# Patient Record
Sex: Female | Born: 1955 | Race: White | Hispanic: No | Marital: Single | Smoking: Current every day smoker
Health system: Southern US, Community
[De-identification: ages and names within clinical notes are randomized; demographics above are authoritative.]

## PROBLEM LIST (undated history)

## (undated) DIAGNOSIS — J439 Emphysema, unspecified: Secondary | ICD-10-CM

## (undated) DIAGNOSIS — G35D Multiple sclerosis, unspecified: Secondary | ICD-10-CM

## (undated) DIAGNOSIS — G47419 Narcolepsy without cataplexy: Secondary | ICD-10-CM

## (undated) DIAGNOSIS — G709 Myoneural disorder, unspecified: Secondary | ICD-10-CM

## (undated) DIAGNOSIS — M1990 Unspecified osteoarthritis, unspecified site: Secondary | ICD-10-CM

## (undated) DIAGNOSIS — J449 Chronic obstructive pulmonary disease, unspecified: Secondary | ICD-10-CM

## (undated) DIAGNOSIS — D649 Anemia, unspecified: Secondary | ICD-10-CM

## (undated) DIAGNOSIS — G35 Multiple sclerosis: Secondary | ICD-10-CM

## (undated) HISTORY — PX: TUBAL LIGATION: SHX77

## (undated) HISTORY — DX: Anemia, unspecified: D64.9

## (undated) HISTORY — PX: ORIF FACIAL FRACTURE: SHX2118

## (undated) HISTORY — PX: OTHER SURGICAL HISTORY: SHX169

## (undated) HISTORY — DX: Myoneural disorder, unspecified: G70.9

## (undated) HISTORY — PX: EYE SURGERY: SHX253

## (undated) HISTORY — DX: Chronic obstructive pulmonary disease, unspecified: J44.9

## (undated) HISTORY — DX: Emphysema, unspecified: J43.9

## (undated) HISTORY — DX: Unspecified osteoarthritis, unspecified site: M19.90

## (undated) HISTORY — DX: Narcolepsy without cataplexy: G47.419

---

## 2005-04-17 ENCOUNTER — Emergency Department (HOSPITAL_COMMUNITY): Admission: EM | Admit: 2005-04-17 | Discharge: 2005-04-17 | Payer: Self-pay | Admitting: Emergency Medicine

## 2006-06-21 ENCOUNTER — Emergency Department (HOSPITAL_COMMUNITY): Admission: EM | Admit: 2006-06-21 | Discharge: 2006-06-22 | Payer: Self-pay | Admitting: Emergency Medicine

## 2009-01-05 ENCOUNTER — Emergency Department (HOSPITAL_COMMUNITY): Admission: EM | Admit: 2009-01-05 | Discharge: 2009-01-06 | Payer: Self-pay | Admitting: Emergency Medicine

## 2009-01-28 ENCOUNTER — Emergency Department (HOSPITAL_COMMUNITY): Admission: EM | Admit: 2009-01-28 | Discharge: 2009-01-28 | Payer: Self-pay | Admitting: Emergency Medicine

## 2010-06-03 LAB — DIFFERENTIAL
Basophils Absolute: 0 10*3/uL (ref 0.0–0.1)
Basophils Relative: 0 % (ref 0–1)
Eosinophils Absolute: 0.2 10*3/uL (ref 0.0–0.7)
Eosinophils Relative: 2 % (ref 0–5)
Lymphocytes Relative: 20 % (ref 12–46)
Lymphs Abs: 2.3 10*3/uL (ref 0.7–4.0)
Monocytes Absolute: 0.8 10*3/uL (ref 0.1–1.0)
Monocytes Relative: 7 % (ref 3–12)
Neutro Abs: 8.3 10*3/uL — ABNORMAL HIGH (ref 1.7–7.7)
Neutrophils Relative %: 71 % (ref 43–77)

## 2010-06-03 LAB — CBC
HCT: 36.9 % (ref 36.0–46.0)
Hemoglobin: 12.8 g/dL (ref 12.0–15.0)
MCHC: 34.6 g/dL (ref 30.0–36.0)
MCV: 99.3 fL (ref 78.0–100.0)
Platelets: 257 10*3/uL (ref 150–400)
RBC: 3.72 MIL/uL — ABNORMAL LOW (ref 3.87–5.11)
RDW: 13.4 % (ref 11.5–15.5)
WBC: 11.6 10*3/uL — ABNORMAL HIGH (ref 4.0–10.5)

## 2010-06-03 LAB — GLUCOSE, CAPILLARY: Glucose-Capillary: 95 mg/dL (ref 70–99)

## 2010-06-03 LAB — BASIC METABOLIC PANEL
BUN: 8 mg/dL (ref 6–23)
CO2: 29 mEq/L (ref 19–32)
Calcium: 8.8 mg/dL (ref 8.4–10.5)
Chloride: 101 mEq/L (ref 96–112)
Creatinine, Ser: 0.61 mg/dL (ref 0.4–1.2)
GFR calc Af Amer: >60 mL/min (ref >60)
GFR calc non Af Amer: >60 mL/min (ref >60)
Glucose, Bld: 114 mg/dL — ABNORMAL HIGH (ref 70–99)
Potassium: 3.7 mEq/L (ref 3.5–5.1)
Sodium: 136 mEq/L (ref 135–145)

## 2010-06-03 LAB — POCT CARDIAC MARKERS
CKMB, poc: <1 ng/mL — ABNORMAL LOW (ref 1.0–8.0)
Myoglobin, poc: 39.5 ng/mL (ref 12–200)
Troponin i, poc: <0.05 ng/mL (ref 0.00–0.09)

## 2010-06-03 LAB — ETHANOL: Alcohol, Ethyl (B): <5 mg/dL (ref 0–10)

## 2015-10-14 ENCOUNTER — Encounter (HOSPITAL_COMMUNITY): Payer: Self-pay | Admitting: Emergency Medicine

## 2015-10-14 ENCOUNTER — Emergency Department (HOSPITAL_COMMUNITY)
Admission: EM | Admit: 2015-10-14 | Discharge: 2015-10-15 | Disposition: A | Discharge status: Home / Self Care | Payer: Medicaid Other | Attending: Emergency Medicine | Admitting: Emergency Medicine

## 2015-10-14 DIAGNOSIS — F17200 Nicotine dependence, unspecified, uncomplicated: Secondary | ICD-10-CM | POA: Insufficient documentation

## 2015-10-14 DIAGNOSIS — Z791 Long term (current) use of non-steroidal anti-inflammatories (NSAID): Secondary | ICD-10-CM | POA: Insufficient documentation

## 2015-10-14 DIAGNOSIS — Y939 Activity, unspecified: Secondary | ICD-10-CM | POA: Diagnosis not present

## 2015-10-14 DIAGNOSIS — G8929 Other chronic pain: Secondary | ICD-10-CM | POA: Insufficient documentation

## 2015-10-14 DIAGNOSIS — W19XXXA Unspecified fall, initial encounter: Secondary | ICD-10-CM | POA: Insufficient documentation

## 2015-10-14 DIAGNOSIS — Y999 Unspecified external cause status: Secondary | ICD-10-CM | POA: Insufficient documentation

## 2015-10-14 DIAGNOSIS — M545 Low back pain, unspecified: Secondary | ICD-10-CM

## 2015-10-14 DIAGNOSIS — Y929 Unspecified place or not applicable: Secondary | ICD-10-CM | POA: Insufficient documentation

## 2015-10-14 NOTE — ED Triage Notes (Signed)
Pt c/o back pain and legs buckling for a while. Pt states she fell twice today.

## 2015-10-15 ENCOUNTER — Emergency Department (HOSPITAL_COMMUNITY): Payer: Medicaid Other

## 2015-10-15 LAB — BASIC METABOLIC PANEL
Anion gap: 7 (ref 5–15)
BUN: 10 mg/dL (ref 6–20)
CO2: 26 mmol/L (ref 22–32)
Calcium: 9 mg/dL (ref 8.9–10.3)
Chloride: 100 mmol/L — ABNORMAL LOW (ref 101–111)
Creatinine, Ser: 0.79 mg/dL (ref 0.44–1.00)
GFR calc Af Amer: >60 mL/min (ref >60)
GFR calc non Af Amer: >60 mL/min (ref >60)
Glucose, Bld: 102 mg/dL — ABNORMAL HIGH (ref 65–99)
Potassium: 3.5 mmol/L (ref 3.5–5.1)
Sodium: 133 mmol/L — ABNORMAL LOW (ref 135–145)

## 2015-10-15 LAB — CBC WITH DIFFERENTIAL/PLATELET
Basophils Absolute: 0 10*3/uL (ref 0.0–0.1)
Basophils Relative: 0 %
Eosinophils Absolute: 0.2 10*3/uL (ref 0.0–0.7)
Eosinophils Relative: 2 %
HCT: 43.8 % (ref 36.0–46.0)
Hemoglobin: 15.1 g/dL — ABNORMAL HIGH (ref 12.0–15.0)
Lymphocytes Relative: 27 %
Lymphs Abs: 3.2 10*3/uL (ref 0.7–4.0)
MCH: 32.2 pg (ref 26.0–34.0)
MCHC: 34.5 g/dL (ref 30.0–36.0)
MCV: 93.4 fL (ref 78.0–100.0)
Monocytes Absolute: 0.9 10*3/uL (ref 0.1–1.0)
Monocytes Relative: 8 %
Neutro Abs: 7.6 10*3/uL (ref 1.7–7.7)
Neutrophils Relative %: 63 %
Platelets: 310 10*3/uL (ref 150–400)
RBC: 4.69 MIL/uL (ref 3.87–5.11)
RDW: 13.1 % (ref 11.5–15.5)
WBC: 11.9 10*3/uL — ABNORMAL HIGH (ref 4.0–10.5)

## 2015-10-15 LAB — URINALYSIS, ROUTINE W REFLEX MICROSCOPIC
Bilirubin Urine: NEGATIVE
Glucose, UA: NEGATIVE mg/dL
Hgb urine dipstick: NEGATIVE
Ketones, ur: NEGATIVE mg/dL
Leukocytes, UA: NEGATIVE
Nitrite: NEGATIVE
Protein, ur: NEGATIVE mg/dL
Specific Gravity, Urine: <1.005 — ABNORMAL LOW (ref 1.005–1.030)
pH: 6 (ref 5.0–8.0)

## 2015-10-15 MED ORDER — PREDNISONE 20 MG PO TABS: ORAL_TABLET | ORAL | 0 refills | Status: DC

## 2015-10-15 MED ORDER — CYCLOBENZAPRINE HCL 10 MG PO TABS
10.0000 mg | ORAL_TABLET | Freq: Once | ORAL | Status: AC
  Administered 2015-10-15: 10 mg via ORAL
  Filled 2015-10-15: qty 1

## 2015-10-15 MED ORDER — DEXAMETHASONE SODIUM PHOSPHATE 10 MG/ML IJ SOLN
10.0000 mg | Freq: Once | INTRAMUSCULAR | Status: AC
  Administered 2015-10-15: 10 mg via INTRAMUSCULAR
  Filled 2015-10-15: qty 1

## 2015-10-15 MED ORDER — KETOROLAC TROMETHAMINE 60 MG/2ML IM SOLN
60.0000 mg | Freq: Once | INTRAMUSCULAR | Status: AC
  Administered 2015-10-15: 60 mg via INTRAMUSCULAR
  Filled 2015-10-15: qty 2

## 2015-10-15 MED ORDER — OXYCODONE-ACETAMINOPHEN 5-325 MG PO TABS
1.0000 | ORAL_TABLET | Freq: Once | ORAL | Status: AC
  Administered 2015-10-15: 1 via ORAL
  Filled 2015-10-15: qty 1

## 2015-10-15 MED ORDER — CYCLOBENZAPRINE HCL 5 MG PO TABS: 5.0000 mg | ORAL_TABLET | Freq: Three times a day (TID) | ORAL | 0 refills | Status: DC | PRN

## 2015-10-15 NOTE — Discharge Instructions (Signed)
Use ice and heat on your back. Take the medications as prescribed. Keep your appointment with Dr Legrand Rams as scheduled. You need to start using a cane or walker. You need to let Dr Legrand Rams know if you are getting worse before your appointment.

## 2015-10-15 NOTE — ED Provider Notes (Signed)
[facility] DEPT Provider Note   CSN: [id number] Arrival date & time: 10/14/15  2245     History   Chief Complaint Chief Complaint  Patient presents with  . Back Pain    HPI Faith [name] is a 60 y.o. female.  HPI   Faith [name] is a 60 y.o. female who presents to the Emergency Department complaining of gradually worsening low back pain.  She reports back pain since April of this year that has now progressed and she is having difficulty walking and standing and has to hold to objects to walk.  She reports weakness to both legs with standing and states she fell two times earlier today.  She states she has been taking ibuprofen which typically helps the pain.  She states the pain is constant and worse with standing.  She denies swelling or numbness of her LE's, fever, abdominal pain,urine or bowel incontinence or retention.  She has a first time appt with Dr. Legrand Rams on Sept 1  History reviewed. No pertinent past medical history.  There are no active problems to display for this patient.   Past Surgical History:  Procedure Laterality Date  . ORIF FACIAL FRACTURE      OB History    No data available       Home Medications    Prior to Admission medications   Medication Sig Start Date End Date Taking? Authorizing Provider  ibuprofen (ADVIL,MOTRIN) 200 MG tablet Take 200 mg by mouth every 6 (six) hours as needed.   Yes Historical Provider, MD    Family History History reviewed. No pertinent family history.  Social History Social History  Substance Use Topics  . Smoking status: Current Every Day Smoker  . Smokeless tobacco: Never Used  . Alcohol use No     Allergies   Diovan [valsartan] and Penicillins   Review of Systems Review of Systems  Constitutional: Negative for fever.  Respiratory: Negative for shortness of breath.   Gastrointestinal: Negative for abdominal pain, blood in stool, constipation and vomiting.  Genitourinary: Negative for decreased urine  volume, difficulty urinating, dysuria, flank pain and hematuria.  Musculoskeletal: Positive for back pain. Negative for joint swelling.  Skin: Negative for rash.  Neurological: Negative for weakness (bilateral leg weakness) and numbness.  All other systems reviewed and are negative.    Physical Exam Updated Vital Signs BP 154/94 (BP Location: Right Arm)   Pulse 77   Temp 98.1 F (36.7 C) (Oral)   Resp 18   Ht 5\' 3"  (1.6 m)   Wt 51.3 kg   SpO2 99%   BMI 20.02 kg/m   Physical Exam  Constitutional: She is oriented to person, place, and time. She appears well-developed and well-nourished.  Appears frail and uncomfortable  HENT:  Head: Normocephalic and atraumatic.  Mouth/Throat: Oropharynx is clear and moist.  Neck: Normal range of motion. Neck supple.  Cardiovascular: Normal rate, regular rhythm and intact distal pulses.   No murmur heard. Pulmonary/Chest: Effort normal and breath sounds normal. No respiratory distress.  Abdominal: Soft. She exhibits no distension and no mass. There is no tenderness. There is no guarding.  Musculoskeletal: She exhibits tenderness. She exhibits no edema.       Lumbar back: She exhibits tenderness and pain. She exhibits normal range of motion, no swelling, no deformity, no laceration and normal pulse.  ttp of the lower lumbar spine and bilateral lumbar paraspinal muscles.  DP pulses are brisk and symmetrical.  Distal sensation intact.  Pt  has 3/5 strength against resistance of bilateral lower extremities.  No edema   Neurological: She is alert and oriented to person, place, and time. She has normal strength. No sensory deficit. She exhibits normal muscle tone. Coordination normal.  Skin: Skin is warm and dry. No rash noted.  Nursing note and vitals reviewed.    ED Treatments / Results  Labs (all labs ordered are listed, but only abnormal results are displayed) Labs Reviewed  BASIC METABOLIC PANEL - Abnormal; Notable for the following:        Result Value   Sodium 133 (*)    Chloride 100 (*)    Glucose, Bld 102 (*)    All other components within normal limits  CBC WITH DIFFERENTIAL/PLATELET - Abnormal; Notable for the following:    WBC 11.9 (*)    Hemoglobin 15.1 (*)    All other components within normal limits  URINALYSIS, ROUTINE W REFLEX MICROSCOPIC (NOT AT [facility])    EKG  EKG Interpretation None       Radiology Dg Lumbar Spine Complete  Result Date: 10/15/2015 CLINICAL DATA:  Chronic lower back pain and difficulty ambulating. Balance issues. Initial encounter. EXAM: LUMBAR SPINE - COMPLETE 4+ VIEW COMPARISON:  Lumbar spine radiographs performed 06/18/2015 FINDINGS: There is no evidence of fracture or subluxation. Vertebral bodies demonstrate normal height and alignment. Mild disc space narrowing is noted at L4-L5, with associated vacuum phenomenon. Underlying facet disease is noted. The visualized bowel gas pattern is unremarkable in appearance; air and stool are noted within the colon. The sacroiliac joints are within normal limits. IMPRESSION: No evidence of fracture or subluxation along the lumbar spine. Mild degenerative change at the lower lumbar spine. Electronically Signed   By: Garald Balding M.D.   On: 10/15/2015 00:53    Procedures Procedures (including critical care time)  Medications Ordered in ED Medications  ketorolac (TORADOL) injection 60 mg (not administered)  cyclobenzaprine (FLEXERIL) tablet 10 mg (not administered)  oxyCODONE-acetaminophen (PERCOCET/ROXICET) 5-325 MG per tablet 1 tablet (1 tablet Oral Given 10/15/15 0021)     Initial Impression / Assessment and Plan / ED Course  I have reviewed the triage vital signs and the nursing notes.  Pertinent labs & imaging results that were available during my care of the patient were reviewed by me and considered in my medical decision making (see chart for details).  Clinical Course   Pt with worsening, chronic low back pain and bilateral LE  weakness.  No fever or hx of incontinence of urine or stool.  XR unremarkable.  Hypertensive on arrival, but improving after pain control.    0140 Pain improving after medications.  Urine results pending.  Discussed patient care with Dr. Rolland Porter.  Will also obtain CT of L spine. End of shift, pt signed out to Dr. Tomi Bamberger  Final Clinical Impressions(s) / ED Diagnoses   Final diagnoses:  Chronic low back pain    New Prescriptions New Prescriptions   No medications on file     Bufford Lope 10/15/15 [place], MD 10/15/15 2258

## 2015-10-15 NOTE — ED Provider Notes (Signed)
Patient reports she has been having low back pain that started about a year ago. She went for a disability exam in March but does not have the results of those tests. She states that she is getting to the point now that she has to hold onto furniture or she will fall. She states she's falling more often now than she was several months ago. She states she feels like her knees buckle underneath her. She is not using anything to help her walk such as a cane or walker. She denies any urinary or rectal incontinence.  Patient is sitting in a bedside chair. Interestingly she is able to cross her legs during normal conversation and did that at least twice. She seems to have difficulty standing up. She indicates her lower back is diffusely the area involved with pain. Her patellar reflexes are 2+ and equal, she does not have pain on straight leg raising.  Results for orders placed or performed during the hospital encounter of 10/14/15  Urinalysis, Routine w reflex microscopic  Result Value Ref Range   Color, Urine YELLOW YELLOW   APPearance CLEAR CLEAR   Specific Gravity, Urine <1.005 (L) 1.005 - 1.030   pH 6.0 5.0 - 8.0   Glucose, UA NEGATIVE NEGATIVE mg/dL   Hgb urine dipstick NEGATIVE NEGATIVE   Bilirubin Urine NEGATIVE NEGATIVE   Ketones, ur NEGATIVE NEGATIVE mg/dL   Protein, ur NEGATIVE NEGATIVE mg/dL   Nitrite NEGATIVE NEGATIVE   Leukocytes, UA NEGATIVE NEGATIVE  Basic metabolic panel  Result Value Ref Range   Sodium 133 (L) 135 - 145 mmol/L   Potassium 3.5 3.5 - 5.1 mmol/L   Chloride 100 (L) 101 - 111 mmol/L   CO2 26 22 - 32 mmol/L   Glucose, Bld 102 (H) 65 - 99 mg/dL   BUN 10 6 - 20 mg/dL   Creatinine, Ser 0.79 0.44 - 1.00 mg/dL   Calcium 9.0 8.9 - 10.3 mg/dL   GFR calc non Af Amer >60 >60 mL/min   GFR calc Af Amer >60 >60 mL/min   Anion gap 7 5 - 15  CBC with Differential  Result Value Ref Range   WBC 11.9 (H) 4.0 - 10.5 K/uL   RBC 4.69 3.87 - 5.11 MIL/uL   Hemoglobin 15.1 (H) 12.0  - 15.0 g/dL   HCT 43.8 36.0 - 46.0 %   MCV 93.4 78.0 - 100.0 fL   MCH 32.2 26.0 - 34.0 pg   MCHC 34.5 30.0 - 36.0 g/dL   RDW 13.1 11.5 - 15.5 %   Platelets 310 150 - 400 K/uL   Neutrophils Relative % 63 %   Neutro Abs 7.6 1.7 - 7.7 K/uL   Lymphocytes Relative 27 %   Lymphs Abs 3.2 0.7 - 4.0 K/uL   Monocytes Relative 8 %   Monocytes Absolute 0.9 0.1 - 1.0 K/uL   Eosinophils Relative 2 %   Eosinophils Absolute 0.2 0.0 - 0.7 K/uL   Basophils Relative 0 %   Basophils Absolute 0.0 0.0 - 0.1 K/uL    Laboratory interpretation all normal except mild leukocytosis, no uti   Dg Lumbar Spine Complete  Result Date: 10/15/2015 CLINICAL DATA:  Chronic lower back pain and difficulty ambulating. Balance issues. Initial encounter. EXAM: LUMBAR SPINE - COMPLETE 4+ VIEW COMPARISON:  Lumbar spine radiographs performed 06/18/2015 FINDINGS: There is no evidence of fracture or subluxation. Vertebral bodies demonstrate normal height and alignment. Mild disc space narrowing is noted at L4-L5, with associated vacuum phenomenon. Underlying facet  disease is noted. The visualized bowel gas pattern is unremarkable in appearance; air and stool are noted within the colon. The sacroiliac joints are within normal limits. IMPRESSION: No evidence of fracture or subluxation along the lumbar spine. Mild degenerative change at the lower lumbar spine. Electronically Signed   By: Garald Balding M.D.   On: 10/15/2015 00:53   Ct Lumbar Spine Wo Contrast  Result Date: 10/15/2015 CLINICAL DATA:  60 y/o F; back pain and leg buckling. Fell twice today. EXAM: CT LUMBAR SPINE WITHOUT CONTRAST TECHNIQUE: Multidetector CT imaging of the lumbar spine was performed without intravenous contrast administration. Multiplanar CT image reconstructions were also generated. COMPARISON:  Lumbar radiographs dated 10/15/2015. FINDINGS: Alignment: Five lumbar type non-rib-bearing vertebral bodies are present. Lumbar lordosis is maintained without  listhesis. No pars defect. Vertebral body heights are preserved. No acute fracture is identified. Mild coronal S-shaped curvature is present. There is multilevel degenerative changes with disc space narrowing at the L4-5 level where there is vacuum phenomenon and endplate degenerative changes. There is moderate multilevel facet arthrosis. Disc levels: L1-2: No significant disc displacement, foraminal narrowing, or canal stenosis. L2-3: Disc bulge and ligamentum flavum hypertrophy with mild bilateral foraminal narrowing. L3-4: Disc bulge and moderate facet hypertrophy/ligamentum flavum hypertrophy. Mild-to-moderate bilateral foraminal narrowing and narrowing of lateral recesses. Mild-to-moderate canal stenosis. L4-5: Disc bulge with ossific ridging and moderate bilateral facet/flavum hypertrophy. Mild-to-moderate bilateral foraminal stenosis. Lateral recess narrowing. L5-S1: Disc bulge and facet hypertrophy with mild bilateral foraminal narrowing. Soft tissues: Moderate calcific aortic atherosclerosis of the infrarenal abdominal aorta and bilateral iliac arteries. No soft tissue abnormality is identified on this noncontrast examination. IMPRESSION: 1. No acute fracture or dislocation is identified. 2. 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 3. Aortic atherosclerosis. Electronically Signed   By: Kristine Garbe M.D.   On: 10/15/2015 02:40   Diagnoses that have been ruled out:  None  Diagnoses that are still under consideration:  None  Final diagnoses:  Chronic low back pain    New Prescriptions   CYCLOBENZAPRINE (FLEXERIL) 5 MG TABLET    Take 1 tablet (5 mg total) by mouth 3 (three) times daily as needed.   PREDNISONE (DELTASONE) 20 MG TABLET    Take 3 po QD x 3d , then 2 po QD x 3d then 1 po QD x 3d    Plan discharge   Rolland Porter, MD, [place] screening examination/treatment/procedure(s) were conducted as a shared visit with non-physician  practitioner(s) and myself.  I personally evaluated the patient during the encounter.    Rolland Porter, MD, [place], MD 10/15/15 [phone]

## 2015-12-11 ENCOUNTER — Encounter (INDEPENDENT_AMBULATORY_CARE_PROVIDER_SITE_OTHER): Payer: Self-pay

## 2015-12-11 ENCOUNTER — Encounter: Payer: Self-pay | Admitting: Family Medicine

## 2015-12-11 ENCOUNTER — Ambulatory Visit (INDEPENDENT_AMBULATORY_CARE_PROVIDER_SITE_OTHER): Payer: Medicaid Other | Admitting: Family Medicine

## 2015-12-11 VITALS — BP 138/76 | HR 99 | Ht 63.5 in | Wt 115.0 lb

## 2015-12-11 DIAGNOSIS — R296 Repeated falls: Secondary | ICD-10-CM | POA: Insufficient documentation

## 2015-12-11 DIAGNOSIS — R29898 Other symptoms and signs involving the musculoskeletal system: Secondary | ICD-10-CM

## 2015-12-11 DIAGNOSIS — E559 Vitamin D deficiency, unspecified: Secondary | ICD-10-CM

## 2015-12-11 DIAGNOSIS — R251 Tremor, unspecified: Secondary | ICD-10-CM | POA: Insufficient documentation

## 2015-12-11 DIAGNOSIS — Z7689 Persons encountering health services in other specified circumstances: Secondary | ICD-10-CM

## 2015-12-11 DIAGNOSIS — R200 Anesthesia of skin: Secondary | ICD-10-CM

## 2015-12-11 DIAGNOSIS — Z720 Tobacco use: Secondary | ICD-10-CM | POA: Insufficient documentation

## 2015-12-11 DIAGNOSIS — G47419 Narcolepsy without cataplexy: Secondary | ICD-10-CM

## 2015-12-11 DIAGNOSIS — J438 Other emphysema: Secondary | ICD-10-CM | POA: Insufficient documentation

## 2015-12-11 DIAGNOSIS — M47896 Other spondylosis, lumbar region: Secondary | ICD-10-CM

## 2015-12-11 DIAGNOSIS — G252 Other specified forms of tremor: Secondary | ICD-10-CM | POA: Insufficient documentation

## 2015-12-11 DIAGNOSIS — Z23 Encounter for immunization: Secondary | ICD-10-CM

## 2015-12-11 DIAGNOSIS — M47816 Spondylosis without myelopathy or radiculopathy, lumbar region: Secondary | ICD-10-CM | POA: Insufficient documentation

## 2015-12-11 DIAGNOSIS — I700 Atherosclerosis of aorta: Secondary | ICD-10-CM | POA: Insufficient documentation

## 2015-12-11 LAB — CBC WITH DIFFERENTIAL/PLATELET
Basophils Absolute: 0 {cells}/uL (ref 0–200)
Basophils Relative: 0 %
Eosinophils Absolute: 103 {cells}/uL (ref 15–500)
Eosinophils Relative: 1 %
HCT: 43.5 % (ref 35.0–45.0)
Hemoglobin: 14.8 g/dL (ref 11.7–15.5)
Lymphocytes Relative: 21 %
Lymphs Abs: 2163 {cells}/uL (ref 850–3900)
MCH: 31.8 pg (ref 27.0–33.0)
MCHC: 34 g/dL (ref 32.0–36.0)
MCV: 93.3 fL (ref 80.0–100.0)
MPV: 9.2 fL (ref 7.5–12.5)
Monocytes Absolute: 721 {cells}/uL (ref 200–950)
Monocytes Relative: 7 %
Neutro Abs: 7313 {cells}/uL (ref 1500–7800)
Neutrophils Relative %: 71 %
Platelets: 347 K/uL (ref 140–400)
RBC: 4.66 MIL/uL (ref 3.80–5.10)
RDW: 13.6 % (ref 11.0–15.0)
WBC: 10.3 K/uL (ref 3.8–10.8)

## 2015-12-11 LAB — VITAMIN B12: Vitamin B-12: 736 pg/mL (ref 200–1100)

## 2015-12-11 MED ORDER — GABAPENTIN 300 MG PO CAPS: 300.0000 mg | ORAL_CAPSULE | Freq: Three times a day (TID) | ORAL | 1 refills | Status: DC

## 2015-12-11 MED ORDER — BUPROPION HCL ER (SR) 150 MG PO TB12: 150.0000 mg | ORAL_TABLET | Freq: Two times a day (BID) | ORAL | 1 refills | Status: DC

## 2015-12-11 MED ORDER — IBUPROFEN 800 MG PO TABS: 800.0000 mg | ORAL_TABLET | Freq: Three times a day (TID) | ORAL | 0 refills | Status: DC | PRN

## 2015-12-11 NOTE — Patient Instructions (Signed)
Take the wellbutrin once a day for one week..then increase to two times a day.  Once you have been on this for 2-3 weeks, set a quit date and PUT CIGARETTES DOWN.  May use nicotine patch or replacement as needed.  Take the ibuprofen as needed pain.  Take with food  Take the gabapentin for pain and nerve damage in legs.  Start at bedtime .  After a couple of days you may increase to 2 to 3 times a day.  May cause drowsiness.  Labs today. I will send you a letter with your test results.  If there is anything of concern, we will call right away.  I have referred to a spine specialist  I have referred to a neurologist  See me in one month

## 2015-12-11 NOTE — Addendum Note (Signed)
Addended by: Milta Deiters on: 12/11/2015 03:40 PM   Modules accepted: Orders

## 2015-12-11 NOTE — Progress Notes (Signed)
Chief Complaint  Patient presents with  . New Patient (Initial Visit)    establish care   New to establish. This is a frail and very sick appearing 60 year old woman who appears older than stated age.  No records.  No recent medical care.  She stopped working 4 years ago because of low back pain and has become progressively disabled from back pain, leg numbness and weakness ever since then.  She  Has had multiple falls.  Takes ibuprofen for pain.   ER note from august is reviewed and she had a lumbar CT scan.  It shows significant DJD lumbar spine with osteophytes and neural foraminal narrowing and some spinal stenosis.  Labs at that time normal. NO prior back evaluation or treatment, no Pt, NO specialist evaluation, no other imaging.   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 She has a significant intention tremor and this keeps her from eating.  Literally cannot get food to her mouth. Is undernourished. She smokes cigarettes.  Hs been told she has COPD.  I have discussed the multiple health risks associated with cigarette smoking including, but not limited to, cardiovascular disease, lung disease and cancer.  I have strongly recommended that smoking be stopped.  I have reviewed the various methods of quitting including cold [place], classes, nicotine replacements and prescription medications.  I have offered assistance in this difficult process. We specifically discussed that she has aortic atherosclerosis on her CT scan.  She is told she will have a poor outcome from any spine treatment or surgery as a smoker an ddilligently needs to work on quitting.  She agrees to trial of wellbutrin.  Refuses chantix. Last Pap and Mammo over 10 y Never had colo Cannot remember shots but feels like she had a tetanus shot in 2010 when had face injury and surgery.  Agrees to flu and prevnar today and shingles in Jan when she turns 61.    Patient Active Problem List   Diagnosis Date Noted  . Tobacco abuse 12/11/2015  . Aortic atherosclerosis ([place]) 12/11/2015  . 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 Falls frequently 12/11/2015  . 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 Other emphysema ([place]) 12/11/2015    Outpatient Encounter Prescriptions as of 12/11/2015  Medication Sig  . buPROPion (WELLBUTRIN SR) 150 MG 12 hr tablet Take 1 tablet (150 mg total) by mouth 2 (two) times daily.  Marland Kitchen gabapentin (NEURONTIN) 300 MG capsule Take 1 capsule (300 mg total) by mouth 3 (three) times daily. start with one at bedtime.  Increase as tolerated to TID  . ibuprofen (ADVIL,MOTRIN) 800 MG tablet Take 1 tablet (800 mg total) by mouth every 8 (eight) hours as needed.   No facility-administered encounter medications on file as of 12/11/2015.    Past Medical History:  Diagnosis Date  . Anemia    my whole life, Used to take iron  . Arthritis    spine, lumbar  . COPD (chronic obstructive pulmonary disease) ([place])   . Emphysema of lung ([place])   . Narcolepsy    by history, has nightmares  . Neuromuscular disorder ([place])    leg issues from spine    Past Surgical History:  Procedure Laterality Date  . EYE SURGERY Right   . ORIF FACIAL FRACTURE    . TUBAL LIGATION    . tubaligation      Social History   Social History  .  Marital status: Legally Separated    Spouse name: N/A  . Number of children: 3  . Years of education: 12   Occupational History  . disabled     house painting   Social History Main Topics  . Smoking status: Current Every Day Smoker    Packs/day: 0.50    Years: 45.00  . Smokeless tobacco: Never Used  . Alcohol use 1.2 oz/week    2 Cans of beer per week  . Drug use: No  . Sexual activity: Not Currently    Birth control/ protection: Post-menopausal   Other Topics Concern  . Not on file   Social History Narrative   Lives with family or friends   Family History  Problem Relation Age of Onset    . Heart attack Mother   . Kidney failure Mother   . Diabetes Mother   . Heart disease Mother   . Hyperlipidemia Mother   . Hypertension Mother   . Heart attack Father 31  . Hypertension Father   . Hypertension Sister   . Diabetes Sister   . Other Brother     house fire  . Hypertension Brother   . Seizures Brother   . Heart disease Brother     mitral valve  . Arthritis Daughter     DJD, AS fibromyalgia  . Polymyositis Daughter   . Cancer Maternal Grandfather     lung  . Heart disease Maternal Grandfather   . Other Paternal Grandmother     house fire  . Alcohol abuse Son    Review of Systems  Constitutional: Positive for malaise/fatigue and weight loss. Negative for chills and fever.  HENT: Negative for congestion and hearing loss.        Dental problems  Eyes: Negative for blurred vision and pain.       Poor vision  Respiratory: Positive for cough. Negative for shortness of breath.        "smokers cough"  Cardiovascular: Negative for chest pain, palpitations and leg swelling.  Gastrointestinal: Negative for abdominal pain, constipation, diarrhea and heartburn.  Genitourinary: Negative for dysuria and frequency.       No vaginal bleeding  Musculoskeletal: Positive for back pain, falls and myalgias. Negative for joint pain.  Neurological: Positive for tremors, sensory change and weakness. Negative for dizziness, seizures and headaches.  Endo/Heme/Allergies: Negative for environmental allergies. Bruises/bleeds easily.  Psychiatric/Behavioral: Negative for depression. The patient is not nervous/anxious and does not have insomnia.    BP 138/76 (BP Location: Left Arm, Patient Position: Sitting, Cuff Size: Normal)   Pulse 99   Ht 5' 3.5" (1.613 m)   Wt 115 lb 0.6 oz (52.2 kg)   SpO2 95%   BMI 20.06 kg/m   Physical Exam  Constitutional: She is oriented to person, place, and time. She appears well-developed.  Thin.  Frail.  Stooped posture.  Small steps with walker   HENT:  Head: Normocephalic and atraumatic.  Right Ear: External ear normal.  Left Ear: External ear normal.  Nose: Nose normal.  Mouth/Throat: Oropharynx is clear and moist.  Cerumen L Poor dentition lower Upper plate  Eyes: Conjunctivae are normal. Pupils are equal, round, and reactive to light.  Neck: Normal range of motion. Neck supple. No JVD present. No thyromegaly present.  Cardiovascular: Normal rate, regular rhythm and normal heart sounds.   Pulmonary/Chest: Effort normal and breath sounds normal. No respiratory distress. She has no wheezes.  Abdominal: Soft. Bowel sounds are normal. She exhibits no distension.  There is no tenderness.  Musculoskeletal:  Lower extremities are weak at hip flexor and knee extensor when examined in chair.  Cannot get onto the table.  Lower legs have decreased sensation to touch  Lymphadenopathy:    She has no cervical adenopathy.  Neurological: She is alert and oriented to person, place, and time. She displays normal reflexes.  Skin: Skin is warm and dry.  Psychiatric: She has a normal mood and affect. Her behavior is normal. Thought content normal.     ASSESSMENT/PLAN:   1. Tobacco abuse Long discussion.  Agrees to quit.  wellbutrin prescribed with instructions.  2. Aortic atherosclerosis ([facility]) - Lipid panel  3. 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 Falls frequently - Ambulatory referral to Neurology  6. Tremor - Ambulatory referral to Neurology  7. Vitamin D deficiency - VITAMIN D 25 Hydroxy (Vit-D Deficiency, Fractures)  8. Bilateral leg numbness - COMPLETE METABOLIC PANEL WITH GFR - Hepatitis C antibody - CBC with Differential/Platelet - TSH - VITAMIN D 25 Hydroxy (Vit-D Deficiency, Fractures) - Vitamin B12 - Ambulatory referral to Spine Surgery - Ambulatory referral to Neurology  9. Leg  weakness, bilateral - Ambulatory referral to Spine Surgery - Ambulatory referral to Neurology  10. Encounter to establish care with new doctor  60 minutes was spent with patient discussing her variety of medical issues, past history and planned interventions.  Conservative management back pain reviewed.  The reasons I am not willing to prescribe narcotics discussed.  Reasons for return sooner than scheduled (increased pain/bowel bladder problems, increasing numbness)  11. Other emphysema ([place]) Smoking cessation discussed in detail and plan outlined.  10 minutes+    Patient Instructions  Take the wellbutrin once a day for one week..then increase to two times a day.  Once you have been on this for 2-3 weeks, set a quit date and PUT CIGARETTES DOWN.  May use nicotine patch or replacement as needed.  Take the ibuprofen as needed pain.  Take with food  Take the gabapentin for pain and nerve damage in legs.  Start at bedtime .  After a couple of days you may increase to 2 to 3 times a day.  May cause drowsiness.  Labs today. I will send you a letter with your test results.  If there is anything of concern, we will call right away.  I have referred to a spine specialist  I have referred to a neurologist  See me in one month     Raylene Everts, MD

## 2015-12-12 ENCOUNTER — Encounter: Payer: Self-pay | Admitting: Family Medicine

## 2015-12-12 LAB — COMPLETE METABOLIC PANEL WITH GFR
ALT: 11 U/L (ref 6–29)
AST: 16 U/L (ref 10–35)
Albumin: 4.1 g/dL (ref 3.6–5.1)
Alkaline Phosphatase: 54 U/L (ref 33–130)
BUN: 9 mg/dL (ref 7–25)
CO2: 27 mmol/L (ref 20–31)
Calcium: 9.9 mg/dL (ref 8.6–10.4)
Chloride: 102 mmol/L (ref 98–110)
Creat: 0.68 mg/dL (ref 0.50–1.05)
GFR, Est African American: >89 mL/min (ref >=60)
GFR, Est Non African American: >89 mL/min (ref >=60)
Glucose, Bld: 86 mg/dL (ref 65–99)
Potassium: 4.3 mmol/L (ref 3.5–5.3)
Sodium: 139 mmol/L (ref 135–146)
Total Bilirubin: 0.3 mg/dL (ref 0.2–1.2)
Total Protein: 7 g/dL (ref 6.1–8.1)

## 2015-12-12 LAB — LIPID PANEL
Cholesterol: 219 mg/dL — ABNORMAL HIGH (ref 125–200)
HDL: 77 mg/dL (ref >=46)
LDL Cholesterol: 121 mg/dL (ref <130)
Total CHOL/HDL Ratio: 2.8 Ratio (ref <=5.0)
Triglycerides: 106 mg/dL (ref <150)
VLDL: 21 mg/dL (ref <30)

## 2015-12-12 LAB — TSH: TSH: 2.17 mIU/L

## 2015-12-12 LAB — HEPATITIS C ANTIBODY: HCV Ab: NEGATIVE

## 2015-12-12 LAB — VITAMIN D 25 HYDROXY (VIT D DEFICIENCY, FRACTURES): Vit D, 25-Hydroxy: 15 ng/mL — ABNORMAL LOW (ref 30–100)

## 2015-12-12 MED ORDER — VITAMIN D (ERGOCALCIFEROL) 1.25 MG (50000 UNIT) PO CAPS: 50000.0000 [IU] | ORAL_CAPSULE | ORAL | 1 refills | Status: DC

## 2016-01-07 ENCOUNTER — Other Ambulatory Visit: Payer: Self-pay | Admitting: Neurology

## 2016-01-07 DIAGNOSIS — R251 Tremor, unspecified: Secondary | ICD-10-CM

## 2016-01-07 DIAGNOSIS — R29898 Other symptoms and signs involving the musculoskeletal system: Secondary | ICD-10-CM

## 2016-01-12 ENCOUNTER — Ambulatory Visit: Payer: Medicaid Other | Admitting: Family Medicine

## 2016-01-14 ENCOUNTER — Ambulatory Visit (HOSPITAL_COMMUNITY)
Admission: RE | Admit: 2016-01-14 | Discharge: 2016-01-14 | Disposition: A | Discharge status: Home / Self Care | Payer: Medicaid Other | Source: Ambulatory Visit | Attending: Neurology | Admitting: Neurology

## 2016-01-14 ENCOUNTER — Encounter: Payer: Self-pay | Admitting: Family Medicine

## 2016-01-14 ENCOUNTER — Ambulatory Visit (INDEPENDENT_AMBULATORY_CARE_PROVIDER_SITE_OTHER): Payer: Medicaid Other | Admitting: Family Medicine

## 2016-01-14 VITALS — BP 126/82 | HR 76 | Temp 97.9°F | Resp 18 | Ht 63.5 in | Wt 118.1 lb

## 2016-01-14 DIAGNOSIS — G35 Multiple sclerosis: Secondary | ICD-10-CM | POA: Diagnosis not present

## 2016-01-14 DIAGNOSIS — R29898 Other symptoms and signs involving the musculoskeletal system: Secondary | ICD-10-CM

## 2016-01-14 DIAGNOSIS — M4802 Spinal stenosis, cervical region: Secondary | ICD-10-CM | POA: Insufficient documentation

## 2016-01-14 DIAGNOSIS — R9082 White matter disease, unspecified: Secondary | ICD-10-CM | POA: Diagnosis not present

## 2016-01-14 DIAGNOSIS — J438 Other emphysema: Secondary | ICD-10-CM

## 2016-01-14 DIAGNOSIS — G9589 Other specified diseases of spinal cord: Secondary | ICD-10-CM | POA: Diagnosis not present

## 2016-01-14 DIAGNOSIS — Z720 Tobacco use: Secondary | ICD-10-CM | POA: Diagnosis not present

## 2016-01-14 DIAGNOSIS — R251 Tremor, unspecified: Secondary | ICD-10-CM | POA: Diagnosis not present

## 2016-01-14 DIAGNOSIS — R296 Repeated falls: Secondary | ICD-10-CM | POA: Diagnosis not present

## 2016-01-14 DIAGNOSIS — R220 Localized swelling, mass and lump, head: Secondary | ICD-10-CM | POA: Diagnosis present

## 2016-01-14 MED ORDER — IBUPROFEN 800 MG PO TABS: 800.0000 mg | ORAL_TABLET | Freq: Three times a day (TID) | ORAL | 1 refills | Status: DC | PRN

## 2016-01-14 NOTE — Progress Notes (Signed)
Chief Complaint  Patient presents with  . Follow-up    lab rev   Saw Dr Merlene Laughter in neurology.  Had more labs, is scheduled for a nerve conduction test and MRI scans. She is eating better and has gained some weight She is happy to feel somewhat improved Has cut her smoking in half in an effort to quit We discussed need for health maintenance such as a PE and mammogram/PAP/colonoscopy.  She wants to wait until she is feeling better, perhaps this spring.    Patient Active Problem List   Diagnosis Date Noted  . Tobacco abuse 12/11/2015  . Aortic atherosclerosis ([place]) 12/11/2015  . 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 Falls frequently 12/11/2015  . 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 Other emphysema ([place]) 12/11/2015    Outpatient Encounter Prescriptions as of 01/14/2016  Medication Sig  . buPROPion (WELLBUTRIN SR) 150 MG 12 hr tablet Take 1 tablet (150 mg total) by mouth 2 (two) times daily.  Marland Kitchen gabapentin (NEURONTIN) 300 MG capsule Take 1 capsule (300 mg total) by mouth 3 (three) times daily. start with one at bedtime.  Increase as tolerated to TID  . ibuprofen (ADVIL,MOTRIN) 800 MG tablet Take 1 tablet (800 mg total) by mouth every 8 (eight) hours as needed.  . Vitamin D, Ergocalciferol, (DRISDOL) 50000 units CAPS capsule Take 1 capsule (50,000 Units total) by mouth every 7 (seven) days.   No facility-administered encounter medications on file as of 01/14/2016.     Allergies  Allergen Reactions  . Diovan [Valsartan]   . Penicillins     Review of Systems  Constitutional: Negative for activity change, appetite change and unexpected weight change.       Trying to gain weight  HENT: Negative for congestion and dental problem.   Eyes: Negative for redness and visual disturbance.  Respiratory: Negative for cough and shortness of breath.   Cardiovascular: Negative for chest pain, palpitations and leg swelling.  Gastrointestinal:  Negative for constipation and diarrhea.  Genitourinary: Negative for difficulty urinating and flank pain.  Musculoskeletal: Positive for back pain and gait problem.  Neurological: Positive for tremors, weakness and numbness. Negative for dizziness and headaches.  Psychiatric/Behavioral: Negative for dysphoric mood and sleep disturbance. The patient is not nervous/anxious.    BP 126/82 (BP Location: Left Arm, Patient Position: Sitting, Cuff Size: Normal)   Pulse 76   Temp 97.9 F (36.6 C) (Oral)   Resp 18   Ht 5' 3.5" (1.613 m)   Wt 118 lb 1.9 oz (53.6 kg)   SpO2 98%   BMI 20.60 kg/m   Physical Exam  Constitutional: She is oriented to person, place, and time. She appears well-developed.  Thin, frail.  pleasant and cooperative  HENT:  Head: Normocephalic and atraumatic.  Mouth/Throat: Oropharynx is clear and moist.  Eyes: Conjunctivae are normal. Pupils are equal, round, and reactive to light.  Neck: Normal range of motion.  Cardiovascular: Normal rate, regular rhythm and normal heart sounds.   Pulmonary/Chest: Effort normal and breath sounds normal. She has no wheezes. She has no rales.  Neurological: She is alert and oriented to person, place, and time.  Resting tremor, mild Leg weakness  Psychiatric: She has a normal mood and affect. Her behavior is normal.    ASSESSMENT/PLAN:  1. Tobacco abuse Improved.  Advised again to quit  2. Falls frequently   3. Tremor   4. Leg weakness, bilateral   5. Other emphysema ([place])  Patient Instructions  Need blood work results from Dr Merlene Laughter See me in January for follow up I will check on your test results Call For problems    Raylene Everts, MD

## 2016-01-14 NOTE — Patient Instructions (Signed)
Need blood work results from Dr Merlene Laughter See me in January for follow up I will check on your test results Call For problems

## 2016-01-15 ENCOUNTER — Encounter: Payer: Self-pay | Admitting: Family Medicine

## 2016-01-19 ENCOUNTER — Other Ambulatory Visit: Payer: Self-pay | Admitting: Neurology

## 2016-01-19 DIAGNOSIS — G35 Multiple sclerosis: Secondary | ICD-10-CM

## 2016-01-27 ENCOUNTER — Ambulatory Visit (HOSPITAL_COMMUNITY)
Admission: RE | Admit: 2016-01-27 | Discharge: 2016-01-27 | Disposition: A | Discharge status: Home / Self Care | Payer: Medicaid Other | Source: Ambulatory Visit | Attending: Neurology | Admitting: Neurology

## 2016-01-27 DIAGNOSIS — G35 Multiple sclerosis: Secondary | ICD-10-CM | POA: Diagnosis present

## 2016-01-27 MED ORDER — GADOBENATE DIMEGLUMINE 529 MG/ML IV SOLN
10.0000 mL | Freq: Once | INTRAVENOUS | Status: AC | PRN
  Administered 2016-01-27: 10 mL via INTRAVENOUS

## 2016-02-05 ENCOUNTER — Telehealth: Payer: Self-pay

## 2016-02-06 NOTE — Telephone Encounter (Signed)
She had a brain MRI ordered by  Dr Merlene Laughter, and I cannot interpret this for her.  The neck MRI, also ordered by neurology, shows multi-level disc disease, bone spurs and arthritic changes.  Nothing serious.  She NEEDS to call Dr D or make another appt to discuss.

## 2016-02-06 NOTE — Telephone Encounter (Signed)
Called pt aware to call neurology.

## 2016-02-18 ENCOUNTER — Telehealth: Payer: Self-pay | Admitting: Family Medicine

## 2016-02-18 DIAGNOSIS — G35 Multiple sclerosis: Secondary | ICD-10-CM

## 2016-02-18 NOTE — Telephone Encounter (Signed)
Patient is needing a shower chair sent to [facility], please advise?

## 2016-02-20 ENCOUNTER — Encounter (HOSPITAL_COMMUNITY): Payer: Self-pay

## 2016-02-20 ENCOUNTER — Encounter (HOSPITAL_COMMUNITY)
Admission: RE | Admit: 2016-02-20 | Discharge: 2016-02-20 | Disposition: A | Discharge status: Home / Self Care | Payer: Medicaid Other | Source: Ambulatory Visit | Attending: Neurology | Admitting: Neurology

## 2016-02-20 DIAGNOSIS — G35 Multiple sclerosis: Secondary | ICD-10-CM | POA: Diagnosis not present

## 2016-02-20 DIAGNOSIS — R2689 Other abnormalities of gait and mobility: Secondary | ICD-10-CM | POA: Diagnosis not present

## 2016-02-20 MED ORDER — SODIUM CHLORIDE 0.9 % IV SOLN: Freq: Once | INTRAVENOUS | Status: DC

## 2016-02-20 MED ORDER — SODIUM CHLORIDE 0.9 % IV SOLN
1000.0000 mg | Freq: Once | INTRAVENOUS | Status: DC
  Filled 2016-02-20: qty 8

## 2016-02-21 ENCOUNTER — Encounter (HOSPITAL_COMMUNITY)
Admission: RE | Admit: 2016-02-21 | Discharge: 2016-02-21 | Disposition: A | Discharge status: Home / Self Care | Payer: Medicaid Other | Source: Ambulatory Visit | Attending: Neurology | Admitting: Neurology

## 2016-02-21 DIAGNOSIS — G35 Multiple sclerosis: Secondary | ICD-10-CM | POA: Diagnosis not present

## 2016-02-21 MED ORDER — SODIUM CHLORIDE 0.9 % IV SOLN
1000.0000 mg | Freq: Once | INTRAVENOUS | Status: DC
  Filled 2016-02-21: qty 8

## 2016-02-22 ENCOUNTER — Encounter (HOSPITAL_COMMUNITY)
Admission: RE | Admit: 2016-02-22 | Discharge: 2016-02-22 | Disposition: A | Discharge status: Home / Self Care | Payer: Medicaid Other | Source: Ambulatory Visit | Attending: Neurology | Admitting: Neurology

## 2016-02-22 DIAGNOSIS — G35 Multiple sclerosis: Secondary | ICD-10-CM | POA: Diagnosis not present

## 2016-02-22 MED ORDER — SODIUM CHLORIDE 0.9 % IV SOLN
Freq: Once | INTRAVENOUS | Status: AC
  Administered 2016-02-22: 11:00:00 via INTRAVENOUS

## 2016-02-22 MED ORDER — METHYLPREDNISOLONE SODIUM SUCC 1000 MG IJ SOLR
1000.0000 mg | Freq: Once | INTRAMUSCULAR | Status: AC
  Filled 2016-02-22: qty 8

## 2016-02-22 MED ORDER — SODIUM CHLORIDE 0.9 % IV SOLN
1000.0000 mg | Freq: Once | INTRAVENOUS | Status: AC
  Administered 2016-02-22: 1000 mg via INTRAVENOUS
  Filled 2016-02-22: qty 8

## 2016-02-23 ENCOUNTER — Encounter (HOSPITAL_COMMUNITY)
Admission: RE | Admit: 2016-02-23 | Discharge: 2016-02-23 | Disposition: A | Discharge status: Home / Self Care | Payer: Medicaid Other | Source: Ambulatory Visit | Attending: Neurology | Admitting: Neurology

## 2016-02-23 DIAGNOSIS — G35 Multiple sclerosis: Secondary | ICD-10-CM | POA: Diagnosis not present

## 2016-02-23 MED ORDER — METHYLPREDNISOLONE SODIUM SUCC 1000 MG IJ SOLR
1000.0000 mg | Freq: Once | INTRAMUSCULAR | Status: AC
  Administered 2016-02-23: 1000 mg via INTRAVENOUS
  Filled 2016-02-23: qty 8

## 2016-02-23 NOTE — Progress Notes (Signed)
Pt arrived for steroid infusion. 22 gauge IV started in Right forearm and infusion started.

## 2016-02-23 NOTE — Progress Notes (Signed)
Infusion complete. IV d/c'd. Pt escorted via wheelchair through the ED.

## 2016-02-24 ENCOUNTER — Telehealth: Payer: Self-pay

## 2016-02-24 ENCOUNTER — Encounter (HOSPITAL_COMMUNITY)
Admission: RE | Admit: 2016-02-24 | Discharge: 2016-02-24 | Disposition: A | Discharge status: Home / Self Care | Payer: Medicaid Other | Source: Ambulatory Visit | Attending: Neurology | Admitting: Neurology

## 2016-02-24 ENCOUNTER — Encounter (HOSPITAL_COMMUNITY): Payer: Self-pay

## 2016-02-24 DIAGNOSIS — G35 Multiple sclerosis: Secondary | ICD-10-CM

## 2016-02-24 MED ORDER — SODIUM CHLORIDE 0.9 % IV SOLN
Freq: Once | INTRAVENOUS | Status: AC
  Administered 2016-02-24: 250 mL via INTRAVENOUS

## 2016-02-24 MED ORDER — METHYLPREDNISOLONE SODIUM SUCC 1000 MG IJ SOLR
1000.0000 mg | Freq: Once | INTRAMUSCULAR | Status: AC
  Administered 2016-02-24: 1000 mg via INTRAVENOUS
  Filled 2016-02-24: qty 8

## 2016-02-24 NOTE — Telephone Encounter (Signed)
Place order for me.  I will sign

## 2016-02-25 NOTE — Telephone Encounter (Signed)
I faxed this to them yesterday,

## 2016-02-25 NOTE — Telephone Encounter (Signed)
Selena please place order, Thanks

## 2016-02-25 NOTE — Telephone Encounter (Signed)
Completed.

## 2016-03-09 ENCOUNTER — Encounter: Payer: Self-pay | Admitting: Family Medicine

## 2016-03-09 ENCOUNTER — Ambulatory Visit (INDEPENDENT_AMBULATORY_CARE_PROVIDER_SITE_OTHER): Payer: Medicaid Other | Admitting: Family Medicine

## 2016-03-09 VITALS — BP 134/72 | HR 88 | Temp 98.0°F | Resp 18 | Ht 63.5 in | Wt 119.1 lb

## 2016-03-09 DIAGNOSIS — E559 Vitamin D deficiency, unspecified: Secondary | ICD-10-CM

## 2016-03-09 DIAGNOSIS — Z720 Tobacco use: Secondary | ICD-10-CM | POA: Diagnosis not present

## 2016-03-09 DIAGNOSIS — G35 Multiple sclerosis: Secondary | ICD-10-CM | POA: Diagnosis not present

## 2016-03-09 DIAGNOSIS — R296 Repeated falls: Secondary | ICD-10-CM

## 2016-03-09 DIAGNOSIS — H547 Unspecified visual loss: Secondary | ICD-10-CM | POA: Diagnosis not present

## 2016-03-09 MED ORDER — GABAPENTIN 300 MG PO CAPS: 300.0000 mg | ORAL_CAPSULE | Freq: Three times a day (TID) | ORAL | 3 refills | Status: DC

## 2016-03-09 NOTE — Progress Notes (Signed)
Chief Complaint  Patient presents with  . Follow-up  Patient is here for follow-up. She is accompanied by her daughter. She is trying to improve her nutrition. She has gained another pound. She is trying to quit smoking. She states a pack of cigarettes now lasts for about 6 days. She continues to try to taper and discontinue tobacco Her strength is improved. From a sitting position she cannot straighten her leg, and cross her legs which is better than before. She completed 5 days of Solu-Medrol prescribed by the neurologist. She has a definitive diagnosis of multiple sclerosis. She has a lumbar puncture pending this week She states she continues to have vision impairment which waxes and wanes. She thinks this might be due to her MS. I will order an ophthalmology consultation. She needs physical therapy to assist with her gait. She continues to have falls. We discussed again that she is behind on her cancer screenings, needing a Pap, mammogram, and colonoscopy. She prefers to defer these until she regains more strength and feels better. Patient Active Problem List   Diagnosis Date Noted  . Vitamin D deficiency 03/09/2016  . Multiple sclerosis ([place]) 02/24/2016  . Tobacco abuse 12/11/2015  . Aortic atherosclerosis ([place]) 12/11/2015  . 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 Falls frequently 12/11/2015  . 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 Other emphysema ([place]) 12/11/2015    Outpatient Encounter Prescriptions as of 03/09/2016  Medication Sig  . buPROPion (WELLBUTRIN SR) 150 MG 12 hr tablet Take 1 tablet (150 mg total) by mouth 2 (two) times daily.  . cholecalciferol (VITAMIN D) 1000 units tablet Take 2,000 Units by mouth daily.  Marland Kitchen gabapentin (NEURONTIN) 300 MG capsule Take 1 capsule (300 mg total) by mouth 3 (three) times daily. start with one at bedtime.  Increase as tolerated to TID  . ibuprofen (ADVIL,MOTRIN) 800 MG tablet Take 1 tablet (800  mg total) by mouth every 8 (eight) hours as needed.  . [DISCONTINUED] gabapentin (NEURONTIN) 300 MG capsule Take 1 capsule (300 mg total) by mouth 3 (three) times daily. start with one at bedtime.  Increase as tolerated to TID  . Vitamin D, Ergocalciferol, (DRISDOL) 50000 units CAPS capsule Take 1 capsule (50,000 Units total) by mouth every 7 (seven) days. (Patient not taking: Reported on 03/09/2016)   No facility-administered encounter medications on file as of 03/09/2016.     Allergies  Allergen Reactions  . Diovan [Valsartan]   . Penicillins     Review of Systems  Constitutional: Negative for activity change, appetite change and unexpected weight change.       Trying to gain weight  HENT: Negative for congestion and dental problem.   Eyes: Negative for redness and visual disturbance.  Respiratory: Negative for cough and shortness of breath.   Cardiovascular: Negative for chest pain, palpitations and leg swelling.  Gastrointestinal: Negative for constipation and diarrhea.  Genitourinary: Negative for difficulty urinating and flank pain.  Musculoskeletal: Positive for back pain and gait problem.       Pain is improved  Neurological: Positive for tremors, weakness and numbness. Negative for dizziness and headaches.  Psychiatric/Behavioral: Negative for dysphoric mood and sleep disturbance. The patient is not nervous/anxious.     BP 134/72 (BP Location: Left Arm, Patient Position: Sitting, Cuff Size: Normal)   Pulse 88   Temp 98 F (36.7 C) (Oral)   Resp 18   Ht 5' 3.5" (1.613 m)   Wt 119 lb  1.9 oz (54 kg)   SpO2 99%   BMI 20.77 kg/m   Physical Exam  Constitutional: She is oriented to person, place, and time. She appears well-developed.  Thin, frail.  pleasant and cooperative  HENT:  Head: Normocephalic and atraumatic.  Mouth/Throat: Oropharynx is clear and moist.  Eyes: Conjunctivae are normal. Pupils are equal, round, and reactive to light.  Neck: Normal range of motion.    Cardiovascular: Normal rate, regular rhythm and normal heart sounds.   Pulmonary/Chest: Effort normal and breath sounds normal. She has no wheezes. She has no rales.  Neurological: She is alert and oriented to person, place, and time.  Resting tremor, mild Leg weakness  Psychiatric: She has a normal mood and affect. Her behavior is normal.    ASSESSMENT/PLAN:  1. MS (multiple sclerosis) ([place])  - Ambulatory referral to Ophthalmology - Ambulatory referral to Physical Therapy  2. Tobacco abuse   3. Vitamin D deficiency Patient completed 12 weeks of high-dose therapy, 50,000 units a week. She will now take 2000 units a day.  4. Vision impairment  - Ambulatory referral to Ophthalmology  5. Falls frequently  - Ambulatory referral to Physical Therapy   Patient Instructions  I will order a physical therapy consult  I will order an eye evaluation  Continue to eat well and try to exercise every day  Need blood work and follow up in 3 months  Call sooner for problems     Raylene Everts, MD

## 2016-03-09 NOTE — Patient Instructions (Addendum)
I will order a physical therapy consult  I will order an eye evaluation  Continue to eat well and try to exercise every day  Need blood work and follow up in 3 months  Call sooner for problems

## 2016-03-11 DIAGNOSIS — G35 Multiple sclerosis: Secondary | ICD-10-CM | POA: Diagnosis not present

## 2016-03-11 DIAGNOSIS — R2681 Unsteadiness on feet: Secondary | ICD-10-CM | POA: Diagnosis not present

## 2016-03-23 ENCOUNTER — Ambulatory Visit (HOSPITAL_COMMUNITY): Payer: Medicaid Other | Attending: Family Medicine | Admitting: Physical Therapy

## 2016-03-23 DIAGNOSIS — R296 Repeated falls: Secondary | ICD-10-CM | POA: Insufficient documentation

## 2016-03-23 DIAGNOSIS — R278 Other lack of coordination: Secondary | ICD-10-CM

## 2016-03-23 DIAGNOSIS — R262 Difficulty in walking, not elsewhere classified: Secondary | ICD-10-CM | POA: Diagnosis present

## 2016-03-23 DIAGNOSIS — R2681 Unsteadiness on feet: Secondary | ICD-10-CM | POA: Diagnosis present

## 2016-03-23 DIAGNOSIS — M6281 Muscle weakness (generalized): Secondary | ICD-10-CM | POA: Diagnosis present

## 2016-03-23 DIAGNOSIS — R2689 Other abnormalities of gait and mobility: Secondary | ICD-10-CM | POA: Diagnosis present

## 2016-03-23 NOTE — Patient Instructions (Signed)
   TOES RAISES - DORSIFLEXION - BOTH  Start with your feet on the ground.  Next, raise up both forefeet and toes as shown as you bend at your ankle.  Keep your heels on the ground the entire time.  Repeat 10 times, 2-3 times per day.    SEATED HAMSTRING STRETCH  While seated, rest your heel on the floor with your knee straight and gently lean forward until a stretch is felt behind your knee/thigh. You may put your foot up on your coffee table, or another stool to help with the stretch.  Hold for at least 3-5 minutes, each leg, 2-3 times per day.       NATIONAL MS SOCIETY AND HOTLINE (SEPARATE PAPER/HANDOUT)   - use your rolling walker for mobility instead of the non-rolling walker

## 2016-03-23 NOTE — Therapy (Signed)
[facility] [address] [place], [place], [address] Phone: [phone]   Fax:  [phone]  Physical Therapy Evaluation  Patient Details  Name: Faith [name] MRN: [id number] Date of Birth: 09/08/1955 Referring Provider: Raylene Everts   Encounter Date: 03/23/2016      PT End of Session - 03/23/16 1223    Visit Number 1   Number of Visits 4   Date for PT Re-Evaluation 04/27/16   Authorization Type Medicaid (submitted on 1/23, waiting for approval)   Authorization Time Period 03/23/16 to 04/23/16   PT Start Time 1032   PT Stop Time 1117   PT Time Calculation (min) 45 min   Equipment Utilized During Treatment Gait belt   Activity Tolerance Patient tolerated treatment well   Behavior During Therapy [facility] for tasks assessed/performed      Past Medical History:  Diagnosis Date  . Anemia    my whole life, Used to take iron  . Arthritis    spine, lumbar  . COPD (chronic obstructive pulmonary disease) ([place])   . Emphysema of lung ([place])   . Narcolepsy    by history, has nightmares  . Neuromuscular disorder ([place])    leg issues from spine    Past Surgical History:  Procedure Laterality Date  . EYE SURGERY Right   . ORIF FACIAL FRACTURE    . TUBAL LIGATION    . tubaligation      There were no vitals filed for this visit.       Subjective Assessment - 03/23/16 1040    Subjective Patient reports that she was diagnosed with MS just a couple of months ago; she got the IVIG injections done 12/21-12/26, she is not sure when her next one is.  She does not know a lot about MS in general. She reports that the hardest thing for her to do is walking; she feels she does better without her walker in her home. She reports usually she is completely dragging both feet, after the IVIG she has been able to move her legs better. Within the past 6 months she has fallen maybe 40 times in 6 months; within the past month she has only fallen 1-2 times.     Pertinent History aortic atherosclerosis, emphysema, MS, lumbar pathology affecting LEs at baseline, narcolepsy, history of falling   Patient Stated Goals run back up a 39ft ladder, get back to painting    Currently in Pain? Yes   Pain Score 3    Pain Location Back   Pain Orientation Lower   Pain Descriptors / Indicators Dull   Pain Type Chronic pain   Pain Radiating Towards none    Pain Onset More than a month ago   Pain Frequency Constant   Aggravating Factors  doing too much    Pain Relieving Factors not sure    Effect of Pain on Daily Activities when flared up, it can stop her from doing quite a bit             [facility] PT Assessment - 03/23/16 0001      Assessment   Medical Diagnosis MS with frequent falls    Referring Provider Raylene Everts    Onset Date/Surgical Date --  diagnosed within the past 2 months    Next MD Visit Dr. Meda Coffee in about 2 months    Prior Therapy none      Balance Screen   Has the patient fallen in the past  6 months Yes   How many times? 40   Has the patient had a decrease in activity level because of a fear of falling?  Yes   Is the patient reluctant to leave their home because of a fear of falling?  No     Prior Function   Level of Independence Independent;Independent with basic ADLs;Independent with gait;Independent with transfers;Requires assistive device for independence   Vocation Unemployed  working on getting disability      Observation/Other Assessments   Observations spasticity noted L hamstirng, R and L hamstrings and quads- able to modulate with passive hip IR/ER      Strength   Right Hip Flexion 2/5   Right Hip Extension 3-/5   Right Hip ABduction 2/5   Left Hip Flexion 2/5   Left Hip Extension 3-/5   Left Hip ABduction 2/5   Right Knee Flexion 3-/5   Right Knee Extension 5/5   Left Knee Flexion 3-/5   Left Knee Extension 5/5   Right Ankle Dorsiflexion 3+/5   Left Ankle Dorsiflexion 3+/5     Ambulation/Gait   Gait  Comments reduced step lenghts, shuffling gait pattern, forward lean, difficulty with use of non-rolling walker, reduced gait speed, fatigue noted      High Level Balance   High Level Balance Comments anterior/posterior and lateral sway with normal and narrow BOS, unable to maintain unsupported standing no more than 10-20 seconds comfortably                            PT Education - 03/23/16 1222    Education provided Yes   Education Details MS society/MS hotline; energy conservation including use of rolling walker for gait; HEP; POC/insurance limitations; our goal will be to progress to pro bono clinics after Medicaid visits are up    Person(s) Educated Patient   Methods Explanation;Handout;Demonstration   Comprehension Verbalized understanding;Returned demonstration;Need further instruction          PT Short Term Goals - 03/23/16 1229      PT SHORT TERM GOAL #1   Title Patient to demonstrate and be able to verbally state the importance of using correct assistive device for increased efficiency of mobilty and reduced falls    Time 1   Period Weeks   Status New     PT SHORT TERM GOAL #2   Title Patient to be able to verbalize at least 3 principles of general fatigue management with MS in order to improve QOL and safety with mobiltiy    Time 1   Period Weeks   Status New     PT SHORT TERM GOAL #3   Title Patient to report that she has begun to utilize [employer] and MS hotline in order to improve QOL and establish adequate social support for her diagnosis    Time 1   Period Weeks   Status New     PT SHORT TERM GOAL #4   Title Patient to be independent with appropriate HEP and self-management MS concepts in order to improve QOL and self-efficacy in managing condition    Time 2   Period Weeks   Status New           PT Long Term Goals - 03/23/16 1232      PT LONG TERM GOAL #1   Title Patient to demonstrate functional strength at least 3+ to 4-/5 in  all tested groups in order to improve gait  and general mobiltiy    Time 3   Period Weeks   Status New     PT LONG TERM GOAL #2   Title Patient to show improved gait mechanics, including improved ankle DF and step lengths/foot clearance, with rollator in order to improve efficiency of mobility and reduce fall risk    Time 3   Period Weeks   Status New     PT LONG TERM GOAL #3   Title Patient to be able to maintain unsupported standing for 5-10 minutes with minimal sway in order to show improved balance and reduced fall risk    Time 3   Period Weeks   Status New     PT LONG TERM GOAL #4   Title Patient to be able to state 5/5 safety precautions in order to show improved general safety and reduced fall risk    Time 3   Period Weeks   Status New     PT LONG TERM GOAL #5   Title Patient to be educated and in contact with  local pro bono clinics in order to ensure ongoing and quality care moving forward    Time 3   Period Weeks   Status New               Plan - 03/23/16 1227    Clinical Impression Statement Patient arrives with recent diagnosis of MS- she reports formal diagnosis was made within the past 2 months-; she received IVIG in late December and reports she has been feeling somewhat better since, but she is overall unfamiliar with MS and does not know much about her diagnosis or prognosis in general. Examination reveals severe functional weakness, significant and severe gait deviation, severe unsteadiness, quite a bit of spasticity in bilateral LEs, and reduced functional activity tolerance. She presents as a very large fall risk at this time. Spent quite a bit of time educating patient regarding MS in general as well as energy management/fatigue, MS society/MS hotline, safety and use of appropriate assistive device for fatigue management, spasticity, prognosis, and POC moving forward. Recommend skilled PT services within Medicaid limitations moving forward and will plan  referral to pro bono clinic after Medicaid approved sessions are completed.    Rehab Potential Fair   Clinical Impairments Affecting Rehab Potential (+) appears motivated to participate, relatively young age, seems to have responded well to IVIG; (-) progressive neuromuscular disorder, frequent falls, complicated by extensive lumbar pathology, insurance limitations    PT Frequency 1x / week   PT Duration 3 weeks   PT Treatment/Interventions ADLs/Self Care Home Management;Biofeedback;DME Instruction;Gait training;Stair training;Functional mobility training;Therapeutic activities;Therapeutic exercise;Balance training;Neuromuscular re-education;Patient/family education;Manual techniques;Passive range of motion;Energy conservation;Taping   PT Next Visit Plan review initial eval and goals; continue educating regarding self-care/management strategies for MS. Functional strength, balance, gait training. Encourage use of rollator.    PT Home Exercise Plan Eval: long duration stretches to manage spastiticy, seated toe raises, use of rollator, MS society hotline    Consulted and Agree with Plan of Care Patient      Patient will benefit from skilled therapeutic intervention in order to improve the following deficits and impairments:  Abnormal gait, Improper body mechanics, Pain, Decreased coordination, Decreased mobility, Impaired tone, Postural dysfunction, Decreased activity tolerance, Decreased strength, Decreased balance, Decreased knowledge of precautions, Decreased safety awareness, Difficulty walking, Impaired flexibility  Visit Diagnosis: Other abnormalities of gait and mobility - Plan: PT plan of care cert/re-cert  Difficulty in walking, not elsewhere classified - Plan:  PT plan of care cert/re-cert  Repeated falls - Plan: PT plan of care cert/re-cert  Muscle weakness (generalized) - Plan: PT plan of care cert/re-cert  Unsteadiness on feet - Plan: PT plan of care cert/re-cert  Other lack of  coordination - Plan: PT plan of care cert/re-cert     Problem List Patient Active Problem List   Diagnosis Date Noted  . Vitamin D deficiency 03/09/2016  . Multiple sclerosis ([place]) 02/24/2016  . Tobacco abuse 12/11/2015  . Aortic atherosclerosis ([place]) 12/11/2015  . 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 Falls frequently 12/11/2015  . 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 Other emphysema ([place]) 12/11/2015    Deniece Ree PT, DPT [facility] [address] [place], [place], [address] Phone: [phone]   Fax:  [phone]  Name: AILI FIEBELKORN MRN: [id number] Date of Birth: June 01, 1955

## 2016-04-07 ENCOUNTER — Encounter (HOSPITAL_COMMUNITY): Payer: Self-pay | Admitting: Physical Therapy

## 2016-04-07 ENCOUNTER — Ambulatory Visit (HOSPITAL_COMMUNITY): Payer: Medicaid Other | Attending: Family Medicine | Admitting: Physical Therapy

## 2016-04-07 DIAGNOSIS — M6281 Muscle weakness (generalized): Secondary | ICD-10-CM | POA: Diagnosis present

## 2016-04-07 DIAGNOSIS — R2681 Unsteadiness on feet: Secondary | ICD-10-CM | POA: Diagnosis present

## 2016-04-07 DIAGNOSIS — R278 Other lack of coordination: Secondary | ICD-10-CM | POA: Diagnosis present

## 2016-04-07 DIAGNOSIS — R2689 Other abnormalities of gait and mobility: Secondary | ICD-10-CM | POA: Diagnosis not present

## 2016-04-07 DIAGNOSIS — R296 Repeated falls: Secondary | ICD-10-CM | POA: Diagnosis present

## 2016-04-07 DIAGNOSIS — R262 Difficulty in walking, not elsewhere classified: Secondary | ICD-10-CM | POA: Diagnosis present

## 2016-04-07 NOTE — Therapy (Signed)
[facility] [address] [place], [place], [address] Phone: [phone]   Fax:  [phone]  Physical Therapy Treatment  Patient Details  Name: Faith [name] MRN: [id number] Date of Birth: February 11, 1956 Referring Provider: Raylene Everts   Encounter Date: 04/07/2016      PT End of Session - 04/07/16 1308    Visit Number 2   Number of Visits 4   Date for PT Re-Evaluation 04/27/16   Authorization Type Medicaid (submitted on 1/23, waiting for approval)   Authorization Time Period 03/23/16 to 04/23/16   PT Start Time 1300   PT Stop Time 1345   PT Time Calculation (min) 45 min   Equipment Utilized During Treatment Gait belt   Activity Tolerance Patient tolerated treatment well   Behavior During Therapy [facility] for tasks assessed/performed      Past Medical History:  Diagnosis Date  . Anemia    my whole life, Used to take iron  . Arthritis    spine, lumbar  . COPD (chronic obstructive pulmonary disease) ([place])   . Emphysema of lung ([place])   . Narcolepsy    by history, has nightmares  . Neuromuscular disorder ([place])    leg issues from spine    Past Surgical History:  Procedure Laterality Date  . EYE SURGERY Right   . ORIF FACIAL FRACTURE    . TUBAL LIGATION    . tubaligation      There were no vitals filed for this visit.      Subjective Assessment - 04/07/16 1304    Subjective Pt states that she her back is bothering her a little bit today. She reports performing her HEP and states she feels like they are helping. She states that she tried to call the MS hotline since her initial eval but reports that the call kept getting disconnected somehow.   Pertinent History aortic atherosclerosis, emphysema, MS, lumbar pathology affecting LEs at baseline, narcolepsy, history of falling   Patient Stated Goals run back up a 51ft ladder, get back to painting    Currently in Pain? Yes   Pain Score 5    Pain Location Back   Pain Orientation  Lower   Pain Descriptors / Indicators Dull   Pain Type Chronic pain   Pain Onset More than a month ago                         [facility] Adult PT Treatment/Exercise - 04/07/16 0001      Therapeutic Activites    Therapeutic Activities Other Therapeutic Activities   Other Therapeutic Activities floor (tall kneeling >quadruped) <> stand transfers x 5 (min A for first rep and then CGA for the rest); Balance activities: Static standing balance and chest press, big circles in & out, and bil lifts with 1# weight bar x 10 reps each, CGA throughout     Exercises   Exercises Knee/Hip;Other Exercises   Other Exercises  quadruped to tall kneeling x 5 reps; balance in tall kneeling with no UE support x 3 mins     Knee/Hip Exercises: Supine   Bridges 10 reps   Straight Leg Raises Both;5 reps  min A with LLE   Other Supine Knee/Hip Exercises bil marching 5 reps each; clamshells with RTB x10 reps                PT Education - 04/07/16 1307    Education provided Yes   Education  Details reviewed initial evaluation and goals with patient; proper transfer technique floor <> standing; updated HEP and given handout   Person(s) Educated Patient   Methods Explanation;Demonstration;Handout   Comprehension Verbalized understanding;Returned demonstration          PT Short Term Goals - 03/23/16 1229      PT SHORT TERM GOAL #1   Title Patient to demonstrate and be able to verbally state the importance of using correct assistive device for increased efficiency of mobilty and reduced falls    Time 1   Period Weeks   Status New     PT SHORT TERM GOAL #2   Title Patient to be able to verbalize at least 3 principles of general fatigue management with MS in order to improve QOL and safety with mobiltiy    Time 1   Period Weeks   Status New     PT SHORT TERM GOAL #3   Title Patient to report that she has begun to utilize [employer] and MS hotline in order to improve QOL and  establish adequate social support for her diagnosis    Time 1   Period Weeks   Status New     PT SHORT TERM GOAL #4   Title Patient to be independent with appropriate HEP and self-management MS concepts in order to improve QOL and self-efficacy in managing condition    Time 2   Period Weeks   Status New           PT Long Term Goals - 03/23/16 1232      PT LONG TERM GOAL #1   Title Patient to demonstrate functional strength at least 3+ to 4-/5 in all tested groups in order to improve gait and general mobiltiy    Time 3   Period Weeks   Status New     PT LONG TERM GOAL #2   Title Patient to show improved gait mechanics, including improved ankle DF and step lengths/foot clearance, with rollator in order to improve efficiency of mobility and reduce fall risk    Time 3   Period Weeks   Status New     PT LONG TERM GOAL #3   Title Patient to be able to maintain unsupported standing for 5-10 minutes with minimal sway in order to show improved balance and reduced fall risk    Time 3   Period Weeks   Status New     PT LONG TERM GOAL #4   Title Patient to be able to state 5/5 safety precautions in order to show improved general safety and reduced fall risk    Time 3   Period Weeks   Status New     PT LONG TERM GOAL #5   Title Patient to be educated and in contact with  local pro bono clinics in order to ensure ongoing and quality care moving forward    Time 3   Period Weeks   Status New               Plan - 04/07/16 1634    Clinical Impression Statement Pt presents to therapy this date with her rollator with improved gait compared to initial eval, however, she was c/o her L foot "dragging" more today. She tolerated therex and balance training well this session but she did demonstrate increased difficulty throughout. She had 2 minor tremor episodes of her LLE during session which required her to take a break and rest in order to maintain her balance  and allow the tremor  to subside. Pt demonstrated good ability to perform floor <> standing transfers with CGA-min A and with no LOB. Her static standing balance continues to be impaired and she needs continued skilled PT intervention to maximize overall function and decrease risk of falls.    Rehab Potential Fair   Clinical Impairments Affecting Rehab Potential (+) appears motivated to participate, relatively young age, seems to have responded well to IVIG; (-) progressive neuromuscular disorder, frequent falls, complicated by extensive lumbar pathology, insurance limitations    PT Frequency 1x / week   PT Duration 3 weeks   PT Treatment/Interventions ADLs/Self Care Home Management;Biofeedback;DME Instruction;Gait training;Stair training;Functional mobility training;Therapeutic activities;Therapeutic exercise;Balance training;Neuromuscular re-education;Patient/family education;Manual techniques;Passive range of motion;Energy conservation;Taping   PT Next Visit Plan continue educating regarding self-care/management strategies for MS. Functional strength, balance, gait training. Encourage use of rollator.   PT Home Exercise Plan Eval: long duration stretches to manage spastiticy, seated toe raises, use of rollator, MS society hotline; 2/7- bridges, clamshells in supine with RTB, emphasize heel-to-toe walking with rollator   Consulted and Agree with Plan of Care Patient      Patient will benefit from skilled therapeutic intervention in order to improve the following deficits and impairments:  Abnormal gait, Improper body mechanics, Pain, Decreased coordination, Decreased mobility, Impaired tone, Postural dysfunction, Decreased activity tolerance, Decreased strength, Decreased balance, Decreased knowledge of precautions, Decreased safety awareness, Difficulty walking, Impaired flexibility  Visit Diagnosis: Other abnormalities of gait and mobility  Difficulty in walking, not elsewhere classified  Repeated falls  Muscle  weakness (generalized)  Unsteadiness on feet  Other lack of coordination     Problem List Patient Active Problem List   Diagnosis Date Noted  . Vitamin D deficiency 03/09/2016  . Multiple sclerosis ([place]) 02/24/2016  . Tobacco abuse 12/11/2015  . Aortic atherosclerosis ([place]) 12/11/2015  . 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 Falls frequently 12/11/2015  . 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 Other emphysema ([place]) 12/11/2015    Geraldine Solar PT, [facility] [address] [place], [place], [address] Phone: [phone]   Fax:  [phone]  Name: TRAVONDA GIRGENTI MRN: [id number] Date of Birth: 1955-10-27

## 2016-04-07 NOTE — Patient Instructions (Signed)
   BRIDGING  While lying on your back, tighten your lower abdominals, squeeze your buttocks and then raise your buttocks off the floor/bed as creating a "Bridge" with your body.  Repeat 10 times, 1-2 times a day.    SUPINE HIP ABDUCTION - ELASTIC BAND CLAMS  Lie down on your back with your knees bent. Place an elastic band around your knees and then draw your knees apart.  Repeat 10 times, 1-2 times a day.    HEEL TOE WALKING PATTERN  -make sure you have some space between your feet- your feet should NOT touch each other when you are walking - with the leg stepping forward, try to hit with your heel first and then roll to your toe - try to maintain good posture when walking

## 2016-04-14 ENCOUNTER — Ambulatory Visit (HOSPITAL_COMMUNITY): Payer: Medicaid Other | Admitting: Physical Therapy

## 2016-04-14 DIAGNOSIS — R2689 Other abnormalities of gait and mobility: Secondary | ICD-10-CM

## 2016-04-14 DIAGNOSIS — R278 Other lack of coordination: Secondary | ICD-10-CM

## 2016-04-14 DIAGNOSIS — R2681 Unsteadiness on feet: Secondary | ICD-10-CM

## 2016-04-14 DIAGNOSIS — R296 Repeated falls: Secondary | ICD-10-CM

## 2016-04-14 DIAGNOSIS — R262 Difficulty in walking, not elsewhere classified: Secondary | ICD-10-CM

## 2016-04-14 DIAGNOSIS — M6281 Muscle weakness (generalized): Secondary | ICD-10-CM

## 2016-04-14 NOTE — Therapy (Signed)
[facility] [place], [place], [address] Phone: [phone]   Fax:  [phone]  Physical Therapy Treatment  Patient Details  Name: Faith [name] MRN: [id number] Date of Birth: 07/15/1955 Referring Provider: Raylene Everts   Encounter Date: 04/14/2016      PT End of Session - 04/14/16 1411    Visit Number 3   Number of Visits 4   Date for PT Re-Evaluation 04/27/16   Authorization Type Medicaid (submitted on 1/23, waiting for approval)   Authorization Time Period 03/23/16 to 04/23/16   PT Start Time 1302   PT Stop Time 1349   PT Time Calculation (min) 47 min   Equipment Utilized During Treatment Gait belt   Activity Tolerance Patient tolerated treatment well   Behavior During Therapy [facility] for tasks assessed/performed      Past Medical History:  Diagnosis Date  . Anemia    my whole life, Used to take iron  . Arthritis    spine, lumbar  . COPD (chronic obstructive pulmonary disease) ([place])   . Emphysema of lung ([facility])   . Narcolepsy    by history, has nightmares  . Neuromuscular disorder ([place])    leg issues from spine    Past Surgical History:  Procedure Laterality Date  . EYE SURGERY Right   . ORIF FACIAL FRACTURE    . TUBAL LIGATION    . tubaligation      There were no vitals filed for this visit.      Subjective Assessment - 04/14/16 1308    Subjective Patient arrives stating that she is doing OK, the past couple of days have been rough and she is  wondering if she needs the IVIG again. She confirms that her MS is the primary progressive type. She has been trying to use the MS society but has had trouble with accidentally cutting them off, her daugher is going to help her with this.    Pertinent History aortic atherosclerosis, emphysema, MS, lumbar pathology affecting LEs at baseline, narcolepsy, history of falling   Patient Stated Goals run back up a 65ft ladder, get back to painting    Currently in Pain?  Yes   Pain Score 7    Pain Location Back   Pain Orientation Lower   Pain Descriptors / Indicators Dull   Pain Type Chronic pain   Pain Radiating Towards none    Pain Onset More than a month ago   Pain Frequency Constant   Aggravating Factors  overdoing it    Pain Relieving Factors not sure    Effect of Pain on Daily Activities wehn flared it can stop her from doing a lot                          [facility] Adult PT Treatment/Exercise - 04/14/16 0001      Therapeutic Activites    Therapeutic Activities Other Therapeutic Activities   Other Therapeutic Activities bed mobility with compensation strategy to use for when L leg feels weak      Knee/Hip Exercises: Standing   Other Standing Knee Exercises static standing x3 minutes no UE support, min guard    Other Standing Knee Exercises quadruped: crwaling x4 laps on short edge of table      Knee/Hip Exercises: Supine   Bridges 15 reps   Straight Leg Raises Both;5 sets   Straight Leg Raises Limitations min guard    Other Supine  Knee/Hip Exercises supine hip ABD 1x10 each LE                 PT Education - 04/14/16 1406    Education provided Yes   Education Details extesnive education provided today regarding fatigue, fatigue management with MS, fatigue management strategies as well; encouraged patient to be flexible in modifying activities as she may be able to participate in many of her favorites in modified conditions/with modified strategies; discussed supine to sit bed mobility with correct form/modifications; encouraged patient to continue using MS Society, also encouraged her to [employer]strom regarding ways to modify her home and activiteis to help manage fatigue; continued to encourage attendance at pro bono clinics after Medicaid alloted PT here is used    Person(s) Educated Patient   Methods Explanation;Handout   Comprehension Verbalized understanding;Need further instruction          PT Short Term Goals -  03/23/16 1229      PT SHORT TERM GOAL #1   Title Patient to demonstrate and be able to verbally state the importance of using correct assistive device for increased efficiency of mobilty and reduced falls    Time 1   Period Weeks   Status New     PT SHORT TERM GOAL #2   Title Patient to be able to verbalize at least 3 principles of general fatigue management with MS in order to improve QOL and safety with mobiltiy    Time 1   Period Weeks   Status New     PT SHORT TERM GOAL #3   Title Patient to report that she has begun to utilize [employer] and MS hotline in order to improve QOL and establish adequate social support for her diagnosis    Time 1   Period Weeks   Status New     PT SHORT TERM GOAL #4   Title Patient to be independent with appropriate HEP and self-management MS concepts in order to improve QOL and self-efficacy in managing condition    Time 2   Period Weeks   Status New           PT Long Term Goals - 03/23/16 1232      PT LONG TERM GOAL #1   Title Patient to demonstrate functional strength at least 3+ to 4-/5 in all tested groups in order to improve gait and general mobiltiy    Time 3   Period Weeks   Status New     PT LONG TERM GOAL #2   Title Patient to show improved gait mechanics, including improved ankle DF and step lengths/foot clearance, with rollator in order to improve efficiency of mobility and reduce fall risk    Time 3   Period Weeks   Status New     PT LONG TERM GOAL #3   Title Patient to be able to maintain unsupported standing for 5-10 minutes with minimal sway in order to show improved balance and reduced fall risk    Time 3   Period Weeks   Status New     PT LONG TERM GOAL #4   Title Patient to be able to state 5/5 safety precautions in order to show improved general safety and reduced fall risk    Time 3   Period Weeks   Status New     PT LONG TERM GOAL #5   Title Patient to be educated and in contact with  local pro bono  clinics in order to ensure  ongoing and quality care moving forward    Time 3   Period Weeks   Status New               Plan - 04/14/16 1411    Clinical Impression Statement Patient arrives today stating that "it is a rough time right now"; she is having increased difficulty with mobility and is wondering if it is time for her IVIG soon- encouraged her to call MD about this. Continued with proximal strengthening and balance training today, also work in quadruped and crawling as well. Extensive discussion regarding energy conservation strategies and self-care/management as well as weighted utensils. Next session plan to give copy of general adaptive tools for patient reference/use. Continued to encourage patient to attend pro-bono clinics moving forward after Medicaid allotted PT sessions are over.    Rehab Potential Fair   Clinical Impairments Affecting Rehab Potential (+) appears motivated to participate, relatively young age, seems to have responded well to IVIG; (-) progressive neuromuscular disorder, frequent falls, complicated by extensive lumbar pathology, insurance limitations    PT Frequency 1x / week   PT Duration 3 weeks   PT Treatment/Interventions ADLs/Self Care Home Management;Biofeedback;DME Instruction;Gait training;Stair training;Functional mobility training;Therapeutic activities;Therapeutic exercise;Balance training;Neuromuscular re-education;Patient/family education;Manual techniques;Passive range of motion;Energy conservation;Taping   PT Next Visit Plan give patient self care equipment handouts, continue to educate regarding MS management strategies. Strength, balance, gait. HEP finalization.    PT Home Exercise Plan Eval: long duration stretches to manage spastiticy, seated toe raises, use of rollator, MS society hotline; 2/7- bridges, clamshells in supine with RTB, emphasize heel-to-toe walking with rollator   Consulted and Agree with Plan of Care Patient      Patient  will benefit from skilled therapeutic intervention in order to improve the following deficits and impairments:  Abnormal gait, Improper body mechanics, Pain, Decreased coordination, Decreased mobility, Impaired tone, Postural dysfunction, Decreased activity tolerance, Decreased strength, Decreased balance, Decreased knowledge of precautions, Decreased safety awareness, Difficulty walking, Impaired flexibility  Visit Diagnosis: Other abnormalities of gait and mobility  Difficulty in walking, not elsewhere classified  Repeated falls  Muscle weakness (generalized)  Unsteadiness on feet  Other lack of coordination     Problem List Patient Active Problem List   Diagnosis Date Noted  . Vitamin D deficiency 03/09/2016  . Multiple sclerosis ([place]) 02/24/2016  . Tobacco abuse 12/11/2015  . Aortic atherosclerosis ([place]) 12/11/2015  . 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 Falls frequently 12/11/2015  . 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 Other emphysema ([place]) 12/11/2015    Deniece Ree PT, DPT [facility] [address] [place], [place], [address] Phone: [phone]   Fax:  [phone]  Name: Faith [name] MRN: [id number] Date of Birth: 1955/04/15

## 2016-04-14 NOTE — Patient Instructions (Signed)
FATIGUE MANAGEMENT  1) Always keep an eye on how you are feeling- on a 0-10 scale (0 = no fatigue, 10= exhaustion). When you get to feeling 5-6/10 on that fatigue scale, REST. You do not want to push yourself to 8-10/10 levels of fatigue.  2) Keep chairs and stools around your home (1-2 in each room) so you have easy access to places to rest   3) Don't be afraid to modify activities- IE sit down to perform activities, take rest breaks as needed, break up tasks  4) When you are getting out of bed: roll onto your side, then swing your legs off/push yourself up at the same time. To get back into bed, hook your left leg with your right, and drop down to your elbow while swinging your legs up onto the bed. You do not need to hook your left leg all the time, just when it feels weak.   5) HOMEWORK: try to figure out modifications you can make around your home (where things are, where you keep objects you use a lot, etc) to conserve your energy!

## 2016-04-21 ENCOUNTER — Ambulatory Visit (HOSPITAL_COMMUNITY): Payer: Medicaid Other | Admitting: Physical Therapy

## 2016-04-21 DIAGNOSIS — R296 Repeated falls: Secondary | ICD-10-CM

## 2016-04-21 DIAGNOSIS — R2681 Unsteadiness on feet: Secondary | ICD-10-CM

## 2016-04-21 DIAGNOSIS — R2689 Other abnormalities of gait and mobility: Secondary | ICD-10-CM

## 2016-04-21 DIAGNOSIS — R278 Other lack of coordination: Secondary | ICD-10-CM

## 2016-04-21 DIAGNOSIS — R262 Difficulty in walking, not elsewhere classified: Secondary | ICD-10-CM

## 2016-04-21 DIAGNOSIS — M6281 Muscle weakness (generalized): Secondary | ICD-10-CM

## 2016-04-21 NOTE — Therapy (Signed)
[place], [place], [address] Phone: [phone]   Fax:  [phone]  Physical Therapy Treatment (Discharge)  Patient Details  Name: Faith [name] MRN: [id number] Date of Birth: [birth year] Referring Provider: Raylene Everts   Encounter Date: 04/21/2016      PT End of Session - 04/21/16 1438    Visit Number 4   Number of Visits 4   Date for PT Re-Evaluation 04/27/16   Authorization Type Medicaid (submitted on 1/23, waiting for approval)   Authorization Time Period 03/23/16 to 04/23/16   PT Start Time 1303   PT Stop Time 1347   PT Time Calculation (min) 44 min   Equipment Utilized During Treatment Gait belt   Activity Tolerance Patient tolerated treatment well   Behavior During Therapy [facility] for tasks assessed/performed      Past Medical History:  Diagnosis Date  . Anemia    my whole life, Used to take iron  . Arthritis    spine, lumbar  . COPD (chronic obstructive pulmonary disease) ([place])   . Emphysema of lung ([place])   . Narcolepsy    by history, has nightmares  . Neuromuscular disorder ([place])    leg issues from spine    Past Surgical History:  Procedure Laterality Date  . EYE SURGERY Right   . ORIF FACIAL FRACTURE    . TUBAL LIGATION    . tubaligation      There were no vitals filed for this visit.      Subjective Assessment - 04/21/16 1308    Subjective Patient arrives tdoay stating they are considering starting her on a new medication that is not IVIG; they are still doing testing to make sure she will be OK today to take it. She has still not been able to get in touch with MS society but still working on it. She has had 2 LOB/close calls with falls, but was able to stop herself from hitting the floor.    Pertinent History aortic atherosclerosis, emphysema, MS, lumbar pathology affecting LEs at baseline, narcolepsy, history of falling   Patient Stated Goals run back up a 28f ladder, get back to  painting    Currently in Pain? Yes   Pain Score 5    Pain Location Back   Pain Orientation Lower   Pain Descriptors / Indicators Dull   Pain Type Chronic pain   Pain Radiating Towards none    Pain Onset More than a month ago   Pain Frequency Constant   Aggravating Factors  overdoing it    Pain Relieving Factors not sure, rest sometimes    Effect of Pain on Daily Activities when flared severe impact                          [facility] Adult PT Treatment/Exercise - 04/21/16 0001      Knee/Hip Exercises: Standing   Other Standing Knee Exercises narrow BOS standing with ball toss; lateral and cross midline reaches for cones in narrow BOS. Min guard provided throughout.                 PT Education - 04/21/16 1437    Education provided Yes   Education Details extesnive education provided today regarding fatigue management, energy conservation with MS, safety strategies to prevent falls; discussed adaptive equipment that patient may benefit from now as well as ones that may be helpful in the future as her  MS progresses; HEP updates. Continued to strongly recommend pro bono clinics for ongoing care moving forwards.    Person(s) Educated Patient   Methods Explanation;Demonstration;Handout   Comprehension Verbalized understanding;Need further instruction          PT Short Term Goals - 04/21/16 1326      PT SHORT TERM GOAL #1   Title Patient to demonstrate and be able to verbally state the importance of using correct assistive device for increased efficiency of mobilty and reduced falls    Baseline 2/21- able to verbalize importance, uses rollator consistently, does require cues for safety occasionally    Time 1   Period Weeks   Status Achieved     PT SHORT TERM GOAL #2   Title Patient to be able to verbalize at least 3 principles of general fatigue management with MS in order to improve QOL and safety with mobiltiy    Baseline 2/21- 2/3    Time 1   Period  Weeks   Status Partially Met     PT SHORT TERM GOAL #3   Title Patient to report that she has begun to utilize [place] and MS hotline in order to improve QOL and establish adequate social support for her diagnosis    Baseline 2/21- wroking on it    Time 1   Period Weeks   Status On-going     PT SHORT TERM GOAL #4   Title Patient to be independent with appropriate HEP and self-management MS concepts in order to improve QOL and self-efficacy in managing condition    Time 2   Period Weeks   Status Achieved           PT Long Term Goals - 04/21/16 1329      PT LONG TERM GOAL #1   Title Patient to demonstrate functional strength at least 3+ to 4-/5 in all tested groups in order to improve gait and general mobiltiy    Time 3   Period Weeks   Status Achieved     PT LONG TERM GOAL #2   Title Patient to show improved gait mechanics, including improved ankle DF and step lengths/foot clearance, with rollator in order to improve efficiency of mobility and reduce fall risk    Baseline 2/21- gait varies depending on good days/bad days    Time 3   Period Weeks   Status Partially Met     PT LONG TERM GOAL #3   Title Patient to be able to maintain unsupported standing for 5-10 minutes with minimal sway in order to show improved balance and reduced fall risk    Baseline 2/21- she tends to lean forward a bit but can stand easily 5 minutes but not 10   Time 3   Period Weeks   Status Partially Met     PT LONG TERM GOAL #4   Title Patient to be able to state 5/5 safety precautions in order to show improved general safety and reduced fall risk    Baseline 2/21- 3/5, cues for the last 2      PT LONG TERM GOAL #5   Title Patient to be educated and in contact with  local pro bono clinics in order to ensure ongoing and quality care moving forward    Time 3   Period Weeks   Status Partially Met               Plan - 04/21/16 1439    Clinical Impression Statement Patient arrives  today for her final session approved by Medicaid; spent quite a bit of time today discussing possible assistive equipment that patient may find useful now and moving forward as her MS progresses, as well as progress towards goal and energy management/fatigue/safety techniques and awareness. Patient does report some subjective changes with skilled PT services but does feel limited by progressing symptoms of MS; objective assessment of goals reveals progress with skilled PT services as well. At this time patient is being discharged from PT due to Medicaid limitations however has been repeatedly educated and strongly encouraged to approach local pro bono clinics regarding ongoing care moving forwards.    Rehab Potential Fair   Clinical Impairments Affecting Rehab Potential (+) appears motivated to participate, relatively young age, seems to have responded well to IVIG; (-) progressive neuromuscular disorder, frequent falls, complicated by extensive lumbar pathology, insurance limitations    PT Next Visit Plan DC today due to Medicaid limitations    PT Home Exercise Plan Eval: long duration stretches to manage spastiticy, seated toe raises, use of rollator, MS society hotline; 2/7- bridges, clamshells in supine with RTB, emphasize heel-to-toe walking with rollator; 2/14: fatigue/energy management and activity modification strategies; 2/21: sit to stand with UEs, seated marches, narrow BOS in safe setup    Recommended Other Services pro bono PT clinics moving forwards    Consulted and Agree with Plan of Care Patient      Patient will benefit from skilled therapeutic intervention in order to improve the following deficits and impairments:  Abnormal gait, Improper body mechanics, Pain, Decreased coordination, Decreased mobility, Impaired tone, Postural dysfunction, Decreased activity tolerance, Decreased strength, Decreased balance, Decreased knowledge of precautions, Decreased safety awareness, Difficulty  walking, Impaired flexibility  Visit Diagnosis: Other abnormalities of gait and mobility  Repeated falls  Difficulty in walking, not elsewhere classified  Muscle weakness (generalized)  Unsteadiness on feet  Other lack of coordination     Problem List Patient Active Problem List   Diagnosis Date Noted  . Vitamin D deficiency 03/09/2016  . Multiple sclerosis ([place]) 02/24/2016  . Tobacco abuse 12/11/2015  . Aortic atherosclerosis ([place]) 12/11/2015  . 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 Falls frequently 12/11/2015  . 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 Other emphysema ([place]) 12/11/2015    PHYSICAL THERAPY DISCHARGE SUMMARY  Visits from Start of Care: 4  Current functional level related to goals / functional outcomes: Patient being discharged due to Medicaid limitations; see above goal review for functional outcomes. Recommend pro bono clinics moving forwards.    Remaining deficits: Gait deficits, unsteadiness, spasticity, poor activity tolerance, back pain, functional weakness, high fall risk    Education / Equipment: Adaptive equipment for current status and moving forward, HEP updates, extensive education provided regarding safety/energy management/fatigue management. Strong encouragement to attend PT at pro bono clinics.  Plan: Patient agrees to discharge.  Patient goals were partially met. Patient is being discharged due to financial reasons.  ?????      Deniece Ree PT, DPT [facility] [address] [place], [place], [address] Phone: [phone]   Fax:  [phone]  Name: Faith [name] MRN: [id number] Date of Birth: [birth year]

## 2016-04-21 NOTE — Patient Instructions (Signed)
   SIT STAND - BOTH HANDS ASSIST  While seated in a chair, scoot forward towards the edge of the chair. Next, hold on to the arm rest with both hands for support and then raise up to standing.  Repeat 5 times, 1-2 times per day.    SEATED MARCHING - ELASTIC BAND  Start by sitting in a chair with an elastic band wrapped around your lower thighs.  Next, move a knee upward, set it back down and then alternate to the other side.  Repeat 5 times each side, 1-2 times a day.    Static Balance: Rhomberg position on floor  Standing with your feet together and arms across your chest, practice keeping your balance with your eyes open. Stand next to a wall or stable surface for balance support if needed.  Hold as long as you can.   Do this at the kitchen counter and with a chair behind you for safety. You may also have a family member present for safety.  Repeat 1-2 times per day.

## 2016-04-30 ENCOUNTER — Encounter (HOSPITAL_COMMUNITY)
Admission: RE | Admit: 2016-04-30 | Discharge: 2016-04-30 | Disposition: A | Discharge status: Home / Self Care | Payer: Medicaid Other | Source: Ambulatory Visit | Attending: Neurology | Admitting: Neurology

## 2016-04-30 ENCOUNTER — Encounter (HOSPITAL_COMMUNITY): Payer: Self-pay

## 2016-04-30 DIAGNOSIS — G35 Multiple sclerosis: Secondary | ICD-10-CM | POA: Insufficient documentation

## 2016-04-30 MED ORDER — SODIUM CHLORIDE 0.9 % IV SOLN
300.0000 mg | INTRAVENOUS | Status: DC
  Administered 2016-04-30: 300 mg via INTRAVENOUS
  Filled 2016-04-30: qty 15

## 2016-04-30 MED ORDER — SODIUM CHLORIDE 0.9 % IV SOLN
INTRAVENOUS | Status: DC
  Administered 2016-04-30: 250 mL via INTRAVENOUS

## 2016-04-30 NOTE — Discharge Instructions (Signed)

## 2016-05-03 ENCOUNTER — Telehealth: Payer: Self-pay | Admitting: Family Medicine

## 2016-05-03 ENCOUNTER — Telehealth: Payer: Self-pay

## 2016-05-03 NOTE — Telephone Encounter (Signed)
Called home number and left message.  She should call tomorrow.  Please see what symptoms are bothering her.

## 2016-05-03 NOTE — Telephone Encounter (Signed)
Faith [name] is stating that Dr. Merlene Laughter did an infusion on her last week and she thinks she is having side effects from it and she is asking if she can talk to Dr. Meda Coffee because supposedly noone from Dr. Alessandra Grout office will return her calls and she has questions regarding medications, please advise?

## 2016-05-04 ENCOUNTER — Telehealth: Payer: Self-pay

## 2016-05-04 NOTE — Telephone Encounter (Signed)
Left message to call office

## 2016-05-04 NOTE — Telephone Encounter (Signed)
Message left to call office

## 2016-05-05 ENCOUNTER — Telehealth: Payer: Self-pay | Admitting: *Deleted

## 2016-05-05 MED ORDER — IBUPROFEN 800 MG PO TABS: 800.0000 mg | ORAL_TABLET | Freq: Three times a day (TID) | ORAL | 1 refills | Status: DC | PRN

## 2016-05-05 MED ORDER — ENSURE COMPLETE PO LIQD: 237.0000 mL | Freq: Two times a day (BID) | ORAL | 2 refills | Status: DC

## 2016-05-05 NOTE — Telephone Encounter (Signed)
PATIENT STATES SHE IS RETURNING SELENA'S PHONE CALL  ALSO REQUESTS REFILL ON IBUPROFEN AND ASKING IF A PRESCRIPTION FOR ENSURE CAN BE SENT TO HER PHARMACY  [facility]

## 2016-05-05 NOTE — Telephone Encounter (Signed)
Attempted to call [facility]il[facility] again, no answer, left message to call office.

## 2016-05-17 ENCOUNTER — Other Ambulatory Visit: Payer: Self-pay | Admitting: Family Medicine

## 2016-05-17 NOTE — Telephone Encounter (Signed)
Seen [phone]

## 2016-05-18 ENCOUNTER — Telehealth: Payer: Self-pay | Admitting: Family Medicine

## 2016-05-18 NOTE — Telephone Encounter (Signed)
Yes.  It is probably needed for her MS treatment

## 2016-05-18 NOTE — Telephone Encounter (Signed)
Left message for Lynnsey to call office to sch.

## 2016-05-18 NOTE — Telephone Encounter (Signed)
Patient is requesting a TB test.

## 2016-05-18 NOTE — Telephone Encounter (Signed)
Do you want to order? 

## 2016-05-18 NOTE — Telephone Encounter (Signed)
Left voicemail for patient to r/s appt on 06/07/16 due to a change in dr.nelson's schedule.

## 2016-05-22 LAB — VITAMIN D 25 HYDROXY (VIT D DEFICIENCY, FRACTURES): Vit D, 25-Hydroxy: 39 ng/mL (ref 30–100)

## 2016-05-24 ENCOUNTER — Encounter: Payer: Self-pay | Admitting: Family Medicine

## 2016-06-01 ENCOUNTER — Encounter (HOSPITAL_COMMUNITY)
Admission: RE | Admit: 2016-06-01 | Discharge: 2016-06-01 | Disposition: A | Discharge status: Home / Self Care | Payer: Medicaid Other | Source: Ambulatory Visit | Attending: Neurology | Admitting: Neurology

## 2016-06-01 ENCOUNTER — Encounter (HOSPITAL_COMMUNITY): Payer: Self-pay

## 2016-06-01 DIAGNOSIS — G35 Multiple sclerosis: Secondary | ICD-10-CM | POA: Diagnosis present

## 2016-06-01 MED ORDER — SODIUM CHLORIDE 0.9 % IV SOLN
300.0000 mg | Freq: Once | INTRAVENOUS | Status: AC
  Administered 2016-06-01: 300 mg via INTRAVENOUS
  Filled 2016-06-01: qty 15

## 2016-06-01 MED ORDER — SODIUM CHLORIDE 0.9 % IV SOLN
Freq: Once | INTRAVENOUS | Status: AC
  Administered 2016-06-01: 250 mL via INTRAVENOUS

## 2016-06-07 ENCOUNTER — Ambulatory Visit: Payer: Medicaid Other | Admitting: Family Medicine

## 2016-06-08 ENCOUNTER — Encounter: Payer: Self-pay | Admitting: Family Medicine

## 2016-06-08 ENCOUNTER — Ambulatory Visit (INDEPENDENT_AMBULATORY_CARE_PROVIDER_SITE_OTHER): Payer: Medicaid Other | Admitting: Family Medicine

## 2016-06-08 VITALS — BP 138/84 | HR 92 | Temp 98.3°F | Resp 18 | Ht 64.0 in | Wt 120.1 lb

## 2016-06-08 DIAGNOSIS — Z720 Tobacco use: Secondary | ICD-10-CM | POA: Diagnosis not present

## 2016-06-08 DIAGNOSIS — J438 Other emphysema: Secondary | ICD-10-CM

## 2016-06-08 DIAGNOSIS — E559 Vitamin D deficiency, unspecified: Secondary | ICD-10-CM

## 2016-06-08 DIAGNOSIS — G35 Multiple sclerosis: Secondary | ICD-10-CM

## 2016-06-08 DIAGNOSIS — R296 Repeated falls: Secondary | ICD-10-CM

## 2016-06-08 NOTE — Progress Notes (Signed)
Chief Complaint  Patient presents with  . Follow-up    3 month   Patient is here for routine follow-up. She continues under the care of neurology for her multiple sclerosis. She completed her physical therapy. She continues to do exercises at home. She is getting regular infusions of Tysabri. In spite of all this she feels she is slowly declining. At times she feels like she has muscular weakness in her neck and difficulty swallowing. Her appetite is improved, no GI symptoms, her weight is stable. She has had no recent falls. She is compliant with using her walker. She feels her balance is poor. She continues to smoke cigarettes. She is down to about 5 cigarettes a day. She understands the importance of smoking cessation. She finds this difficult given her daily stress and worries. She denies any recent cough, sputum, or respiratory infection. With her progressive weakness, she does feel she has more generalized dyspnea. No wheezing. No hemoptysis. The patient requires tuberculosis testing in order to continue on her multiple sclerosis medication. I have ordered a QuantiFERON Gold assay She has a history of vitamin D deficiency. Because of her continued difficulty with balance, I will order another level.  Patient Active Problem List   Diagnosis Date Noted  . Vitamin D deficiency 03/09/2016  . Multiple sclerosis ([place]) 02/24/2016  . Tobacco abuse 12/11/2015  . Aortic atherosclerosis ([place]) 12/11/2015  . 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 Falls frequently 12/11/2015  . 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 Other emphysema ([place]) 12/11/2015    Outpatient Encounter Prescriptions as of 06/08/2016  Medication Sig  . buPROPion (WELLBUTRIN SR) 150 MG 12 hr tablet TAKE (1) TABLET BY MOUTH TWICE DAILY.  . cholecalciferol (VITAMIN D) 1000 units tablet Take 2,000 Units by mouth daily.  . feeding supplement, ENSURE COMPLETE, (ENSURE COMPLETE) LIQD Take  237 mLs by mouth 2 (two) times daily between meals.  . gabapentin (NEURONTIN) 300 MG capsule Take 1 capsule (300 mg total) by mouth 3 (three) times daily. start with one at bedtime.  Increase as tolerated to TID  . ibuprofen (ADVIL,MOTRIN) 800 MG tablet Take 1 tablet (800 mg total) by mouth every 8 (eight) hours as needed.  . natalizumab 300 mg in sodium chloride 0.9 % 100 mL Inject 300 mg into the vein every 28 (twenty-eight) days.  . Vitamin D, Ergocalciferol, (DRISDOL) 50000 units CAPS capsule Take 1 capsule (50,000 Units total) by mouth every 7 (seven) days.   No facility-administered encounter medications on file as of 06/08/2016.     Allergies  Allergen Reactions  . Darvon [Propoxyphene] Other (See Comments)    Hallicuations  . Penicillins Rash    Review of Systems  Constitutional: Negative for activity change, appetite change and unexpected weight change.       Weight is stable  HENT: Positive for trouble swallowing. Negative for congestion and dental problem.   Eyes: Negative for redness and visual disturbance.  Respiratory: Negative for cough and shortness of breath.   Cardiovascular: Negative for chest pain, palpitations and leg swelling.  Gastrointestinal: Negative for constipation and diarrhea.  Genitourinary: Negative for difficulty urinating and flank pain.  Musculoskeletal: Positive for gait problem.       Pain is improved  Neurological: Positive for tremors, weakness and numbness. Negative for dizziness and headaches.       Balance is poor  Psychiatric/Behavioral: Negative for dysphoric mood and sleep disturbance. The patient is not nervous/anxious.  BP 138/84 (BP Location: Right Arm, Patient Position: Sitting, Cuff Size: Normal)   Pulse 92   Temp 98.3 F (36.8 C) (Temporal)   Resp 18   Ht 5\' 4"  (1.626 m)   Wt 120 lb 1.3 oz (54.5 kg)   SpO2 96%   BMI 20.61 kg/m   Physical Exam  Constitutional: She is oriented to person, place, and time. She appears  well-developed.  Thin, frail.  pleasant and cooperative  HENT:  Head: Normocephalic and atraumatic.  Mouth/Throat: Oropharynx is clear and moist.  Eyes: Conjunctivae are normal. Pupils are equal, round, and reactive to light.  Neck: Normal range of motion.  Cardiovascular: Normal rate, regular rhythm and normal heart sounds.   Pulmonary/Chest: Breath sounds normal. She has no wheezes. She has no rales.  Mild dyspnea with conversation  Neurological: She is alert and oriented to person, place, and time.  Resting tremor, mild Leg weakness- stable. Uses hand to lift one leg over the other  Skin: Skin is warm and dry.  Psychiatric: She has a normal mood and affect. Her behavior is normal.    ASSESSMENT/PLAN:  1. MS (multiple sclerosis) ([facility]) Progressive by patient history - CBC - Comprehensive metabolic panel - VITAMIN D 25 Hydroxy (Vit-D Deficiency, Fractures) - Quantiferon tb gold assay  2. Tobacco abuse Importance of quitting is again emphasized to patient  3. Falls frequently Balance poor, but no fall since last visit  4. Other emphysema ([place]) Again, patient is advised to put smoking. Mild dyspnea.  5. Vitamin D deficiency We'll follow   Patient Instructions  Try to reduce or quit smoking Continue your daily therapy exercises Need blood testing I will send you a letter with your test results.  If there is anything of concern, we will call right away.  Call after your visit with neurology  See me in three months   Raylene Everts, MD

## 2016-06-08 NOTE — Patient Instructions (Signed)
Try to reduce or quit smoking Continue your daily therapy exercises Need blood testing I will send you a letter with your test results.  If there is anything of concern, we will call right away.  Call after your visit with neurology  See me in three months

## 2016-06-09 LAB — COMPREHENSIVE METABOLIC PANEL
ALT: 10 U/L (ref 6–29)
AST: 12 U/L (ref 10–35)
Albumin: 4 g/dL (ref 3.6–5.1)
Alkaline Phosphatase: 53 U/L (ref 33–130)
BUN: 9 mg/dL (ref 7–25)
CO2: 27 mmol/L (ref 20–31)
Calcium: 9.2 mg/dL (ref 8.6–10.4)
Chloride: 99 mmol/L (ref 98–110)
Creat: 0.76 mg/dL (ref 0.50–0.99)
Glucose, Bld: 86 mg/dL (ref 65–99)
Potassium: 4.4 mmol/L (ref 3.5–5.3)
Sodium: 134 mmol/L — ABNORMAL LOW (ref 135–146)
Total Bilirubin: 0.2 mg/dL (ref 0.2–1.2)
Total Protein: 6.4 g/dL (ref 6.1–8.1)

## 2016-06-09 LAB — CBC
HCT: 41.8 % (ref 35.0–45.0)
Hemoglobin: 14.3 g/dL (ref 11.7–15.5)
MCH: 31 pg (ref 27.0–33.0)
MCHC: 34.2 g/dL (ref 32.0–36.0)
MCV: 90.7 fL (ref 80.0–100.0)
MPV: 9.5 fL (ref 7.5–12.5)
Platelets: 317 10*3/uL (ref 140–400)
RBC: 4.61 MIL/uL (ref 3.80–5.10)
RDW: 13.7 % (ref 11.0–15.0)
WBC: 10.3 10*3/uL (ref 3.8–10.8)

## 2016-06-09 LAB — VITAMIN D 25 HYDROXY (VIT D DEFICIENCY, FRACTURES): Vit D, 25-Hydroxy: 42 ng/mL (ref 30–100)

## 2016-06-11 LAB — QUANTIFERON TB GOLD ASSAY (BLOOD)
Interferon Gamma Release Assay: NEGATIVE
Mitogen-Nil: 9.53 IU/mL
Quantiferon Nil Value: 0.02 IU/mL
Quantiferon Tb Ag Minus Nil Value: 0 IU/mL

## 2016-06-14 ENCOUNTER — Encounter: Payer: Self-pay | Admitting: Family Medicine

## 2016-06-29 ENCOUNTER — Encounter (HOSPITAL_COMMUNITY)
Admission: RE | Admit: 2016-06-29 | Discharge: 2016-06-29 | Disposition: A | Discharge status: Home / Self Care | Payer: Medicaid Other | Source: Ambulatory Visit | Attending: Neurology | Admitting: Neurology

## 2016-06-29 DIAGNOSIS — G35 Multiple sclerosis: Secondary | ICD-10-CM | POA: Diagnosis present

## 2016-06-29 MED ORDER — SODIUM CHLORIDE 0.9 % IV SOLN
INTRAVENOUS | Status: DC
  Administered 2016-06-29: 10:00:00 via INTRAVENOUS

## 2016-06-29 MED ORDER — SODIUM CHLORIDE 0.9 % IV SOLN
300.0000 mg | INTRAVENOUS | Status: DC
  Administered 2016-06-29: 300 mg via INTRAVENOUS
  Filled 2016-06-29: qty 15

## 2016-07-15 ENCOUNTER — Other Ambulatory Visit: Payer: Self-pay | Admitting: Family Medicine

## 2016-07-27 ENCOUNTER — Encounter (HOSPITAL_COMMUNITY): Payer: Self-pay

## 2016-07-27 ENCOUNTER — Encounter (HOSPITAL_COMMUNITY)
Admission: RE | Admit: 2016-07-27 | Discharge: 2016-07-27 | Disposition: A | Discharge status: Home / Self Care | Payer: Medicaid Other | Source: Ambulatory Visit | Attending: Neurology | Admitting: Neurology

## 2016-07-27 DIAGNOSIS — G35 Multiple sclerosis: Secondary | ICD-10-CM | POA: Diagnosis not present

## 2016-07-27 MED ORDER — SODIUM CHLORIDE 0.9 % IV SOLN
300.0000 mg | Freq: Once | INTRAVENOUS | Status: AC
  Administered 2016-07-27: 300 mg via INTRAVENOUS
  Filled 2016-07-27: qty 15

## 2016-07-27 MED ORDER — SODIUM CHLORIDE 0.9 % IV SOLN
INTRAVENOUS | Status: DC
  Administered 2016-07-27: 11:00:00 via INTRAVENOUS

## 2016-08-24 ENCOUNTER — Encounter (HOSPITAL_COMMUNITY)
Admission: RE | Admit: 2016-08-24 | Discharge: 2016-08-24 | Disposition: A | Discharge status: Home / Self Care | Payer: Medicaid Other | Source: Ambulatory Visit | Attending: Neurology | Admitting: Neurology

## 2016-08-24 DIAGNOSIS — G35 Multiple sclerosis: Secondary | ICD-10-CM | POA: Diagnosis not present

## 2016-08-24 MED ORDER — SODIUM CHLORIDE 0.9 % IV SOLN
300.0000 mg | Freq: Once | INTRAVENOUS | Status: AC
  Administered 2016-08-24: 300 mg via INTRAVENOUS
  Filled 2016-08-24: qty 15

## 2016-08-24 MED ORDER — SODIUM CHLORIDE 0.9 % IV SOLN
INTRAVENOUS | Status: DC
  Administered 2016-08-24: 10:00:00 via INTRAVENOUS

## 2016-09-07 ENCOUNTER — Encounter: Payer: Self-pay | Admitting: Family Medicine

## 2016-09-07 ENCOUNTER — Ambulatory Visit (INDEPENDENT_AMBULATORY_CARE_PROVIDER_SITE_OTHER): Payer: Medicaid Other | Admitting: Family Medicine

## 2016-09-07 VITALS — BP 138/88 | HR 82 | Temp 98.0°F | Resp 15 | Ht 63.0 in | Wt 115.8 lb

## 2016-09-07 DIAGNOSIS — I700 Atherosclerosis of aorta: Secondary | ICD-10-CM | POA: Diagnosis not present

## 2016-09-07 DIAGNOSIS — R29898 Other symptoms and signs involving the musculoskeletal system: Secondary | ICD-10-CM

## 2016-09-07 DIAGNOSIS — Z780 Asymptomatic menopausal state: Secondary | ICD-10-CM

## 2016-09-07 DIAGNOSIS — Z1239 Encounter for other screening for malignant neoplasm of breast: Secondary | ICD-10-CM

## 2016-09-07 DIAGNOSIS — Z720 Tobacco use: Secondary | ICD-10-CM | POA: Diagnosis not present

## 2016-09-07 DIAGNOSIS — Z1231 Encounter for screening mammogram for malignant neoplasm of breast: Secondary | ICD-10-CM | POA: Diagnosis not present

## 2016-09-07 DIAGNOSIS — G35 Multiple sclerosis: Secondary | ICD-10-CM

## 2016-09-07 DIAGNOSIS — E559 Vitamin D deficiency, unspecified: Secondary | ICD-10-CM | POA: Diagnosis not present

## 2016-09-07 NOTE — Patient Instructions (Signed)
Congratulations on cutting down on the smoking Stay as active as you can manage  Come back in 3 months for a PAP and PE We will order your mammogram and Dexa scan Need blood work next time  Call sooner for problems

## 2016-09-07 NOTE — Progress Notes (Signed)
Chief Complaint  Patient presents with  . Follow-up   Still very weak Has lost 5 lbs States she eats well but daughter shakes her head "no" Is compliant with MS treatment  but does not feel it is helping Is to complete a course of current therapy and nave a new MRI brain in the next month She has reduced her cigarettes to 3 a day She is still behind in all of her health maintenance and agrees to schedule her PAP and PE for next visit   Patient Active Problem List   Diagnosis Date Noted  . Vitamin D deficiency 03/09/2016  . Multiple sclerosis ([place]) 02/24/2016  . Tobacco abuse 12/11/2015  . Aortic atherosclerosis ([place]) 12/11/2015  . 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 Falls frequently 12/11/2015  . 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 Other emphysema ([place]) 12/11/2015    Outpatient Encounter Prescriptions as of 09/07/2016  Medication Sig  . buPROPion (WELLBUTRIN SR) 150 MG 12 hr tablet TAKE (1) TABLET BY MOUTH TWICE DAILY.  . cholecalciferol (VITAMIN D) 1000 units tablet Take 2,000 Units by mouth daily.  Marland Kitchen gabapentin (NEURONTIN) 300 MG capsule Take 1 capsule (300 mg total) by mouth 3 (three) times daily. start with one at bedtime.  Increase as tolerated to TID  . ibuprofen (ADVIL,MOTRIN) 800 MG tablet Take 1 tablet (800 mg total) by mouth every 8 (eight) hours as needed.  . natalizumab 300 mg in sodium chloride 0.9 % 100 mL Inject 300 mg into the vein every 28 (twenty-eight) days.  . traMADol (ULTRAM) 50 MG tablet Take 50 mg by mouth every 6 (six) hours as needed.  . feeding supplement, ENSURE COMPLETE, (ENSURE COMPLETE) LIQD Take 237 mLs by mouth 2 (two) times daily between meals. (Patient not taking: Reported on 09/07/2016)   No facility-administered encounter medications on file as of 09/07/2016.     Allergies  Allergen Reactions  . Darvon [Propoxyphene] Other (See Comments)    Hallicuations  . Penicillins Rash    Review  of Systems  Constitutional: Negative for activity change, appetite change and unexpected weight change.       Weight is declined  HENT: Positive for trouble swallowing. Negative for congestion and dental problem.   Eyes: Negative for redness and visual disturbance.  Respiratory: Negative for cough and shortness of breath.   Cardiovascular: Negative for chest pain, palpitations and leg swelling.  Gastrointestinal: Negative for constipation and diarrhea.  Genitourinary: Negative for difficulty urinating and flank pain.  Musculoskeletal: Positive for gait problem.       Pain is improved, strength in legs poor  Neurological: Positive for tremors, weakness and numbness. Negative for dizziness and headaches.       Balance is poor  Psychiatric/Behavioral: Negative for dysphoric mood and sleep disturbance. The patient is not nervous/anxious.     BP 138/88 (BP Location: Left Arm, Patient Position: Sitting, Cuff Size: Normal)   Pulse 82   Temp 98 F (36.7 C) (Other (Comment))   Resp 15   Ht 5\' 3"  (1.6 m)   Wt 115 lb 12 oz (52.5 kg)   SpO2 95%   BMI 20.50 kg/m   Physical Exam  Constitutional: She is oriented to person, place, and time. She appears well-developed.  Thin, frail.  pleasant and cooperative  HENT:  Head: Normocephalic and atraumatic.  Mouth/Throat: Oropharynx is clear and moist.  Eyes: Conjunctivae are normal. Pupils are equal, round, and reactive to light.  Neck:  Normal range of motion.  Cardiovascular: Normal rate, regular rhythm and normal heart sounds.   Pulmonary/Chest: Breath sounds normal. She has no wheezes. She has no rales.  Mild dyspnea with conversation  Neurological: She is alert and oriented to person, place, and time.  Resting tremor, mild Leg weakness- stable. Uses hand to lift one leg over the other  Skin: Skin is warm and dry.  Psychiatric: She has a normal mood and affect. Her behavior is normal.    ASSESSMENT/PLAN:  1. MS (multiple sclerosis)  ([place]) Under care Dr Loralie Champagne  2. Tobacco abuse reducing  3. Leg weakness, bilateral Emphasized exercise to tolerance  4. Vitamin D deficiency replaced  Patient Instructions  Congratulations on cutting down on the smoking Stay as active as you can manage  Come back in 3 months for a PAP and PE We will order your mammogram and Dexa scan Need blood work next time  Call sooner for problems       Raylene Everts, MD

## 2016-09-21 ENCOUNTER — Encounter (HOSPITAL_COMMUNITY)
Admission: RE | Admit: 2016-09-21 | Discharge: 2016-09-21 | Disposition: A | Discharge status: Home / Self Care | Payer: Medicaid Other | Source: Ambulatory Visit | Attending: Neurology | Admitting: Neurology

## 2016-09-21 DIAGNOSIS — G35 Multiple sclerosis: Secondary | ICD-10-CM | POA: Diagnosis not present

## 2016-09-21 MED ORDER — SODIUM CHLORIDE 0.9 % IV SOLN
300.0000 mg | Freq: Once | INTRAVENOUS | Status: AC
  Administered 2016-09-21: 300 mg via INTRAVENOUS
  Filled 2016-09-21: qty 15

## 2016-09-21 MED ORDER — SODIUM CHLORIDE 0.9 % IV SOLN
INTRAVENOUS | Status: DC
  Administered 2016-09-21: 250 mL via INTRAVENOUS

## 2016-09-22 NOTE — Progress Notes (Signed)
Pt phoned stating that her left hand had a bruise, 4" long and 2" wide, with a knot in the middle. Stated that her fingers are pink and warm and that there was no numbness or tingling. Moves fingers well. No pain.  Instructed to keep hand elevated and to apply ice. No improvement in a few day to call back or to call her doctor. Voiced understanding.

## 2016-09-30 ENCOUNTER — Ambulatory Visit (HOSPITAL_COMMUNITY)
Admission: RE | Admit: 2016-09-30 | Discharge: 2016-09-30 | Disposition: A | Discharge status: Home / Self Care | Payer: Medicaid Other | Source: Ambulatory Visit | Attending: Family Medicine | Admitting: Family Medicine

## 2016-09-30 DIAGNOSIS — Z780 Asymptomatic menopausal state: Secondary | ICD-10-CM

## 2016-09-30 DIAGNOSIS — Z1231 Encounter for screening mammogram for malignant neoplasm of breast: Secondary | ICD-10-CM | POA: Insufficient documentation

## 2016-09-30 DIAGNOSIS — Z1239 Encounter for other screening for malignant neoplasm of breast: Secondary | ICD-10-CM

## 2016-09-30 DIAGNOSIS — M85851 Other specified disorders of bone density and structure, right thigh: Secondary | ICD-10-CM | POA: Diagnosis not present

## 2016-09-30 DIAGNOSIS — M8589 Other specified disorders of bone density and structure, multiple sites: Secondary | ICD-10-CM | POA: Diagnosis not present

## 2016-10-04 ENCOUNTER — Encounter: Payer: Self-pay | Admitting: Family Medicine

## 2016-10-04 ENCOUNTER — Telehealth: Payer: Self-pay

## 2016-10-04 ENCOUNTER — Other Ambulatory Visit: Payer: Self-pay | Admitting: Family Medicine

## 2016-10-04 DIAGNOSIS — R928 Other abnormal and inconclusive findings on diagnostic imaging of breast: Secondary | ICD-10-CM

## 2016-10-04 NOTE — Telephone Encounter (Signed)
Orders pended

## 2016-10-06 NOTE — Telephone Encounter (Signed)
error 

## 2016-10-12 ENCOUNTER — Ambulatory Visit (HOSPITAL_COMMUNITY)
Admission: RE | Admit: 2016-10-12 | Discharge: 2016-10-12 | Disposition: A | Discharge status: Home / Self Care | Payer: Medicaid Other | Source: Ambulatory Visit | Attending: Family Medicine | Admitting: Family Medicine

## 2016-10-12 DIAGNOSIS — N6002 Solitary cyst of left breast: Secondary | ICD-10-CM | POA: Diagnosis not present

## 2016-10-12 DIAGNOSIS — R928 Other abnormal and inconclusive findings on diagnostic imaging of breast: Secondary | ICD-10-CM

## 2016-10-19 ENCOUNTER — Encounter (HOSPITAL_COMMUNITY): Payer: Self-pay

## 2016-10-19 ENCOUNTER — Encounter (HOSPITAL_COMMUNITY)
Admission: RE | Admit: 2016-10-19 | Discharge: 2016-10-19 | Disposition: A | Discharge status: Home / Self Care | Payer: Medicaid Other | Source: Ambulatory Visit | Attending: Neurology | Admitting: Neurology

## 2016-10-19 DIAGNOSIS — G35 Multiple sclerosis: Secondary | ICD-10-CM | POA: Insufficient documentation

## 2016-10-19 MED ORDER — SODIUM CHLORIDE 0.9 % IV SOLN
Freq: Once | INTRAVENOUS | Status: AC
  Administered 2016-10-19: 10:00:00 via INTRAVENOUS

## 2016-10-19 MED ORDER — SODIUM CHLORIDE 0.9 % IV SOLN
300.0000 mg | Freq: Once | INTRAVENOUS | Status: AC
  Administered 2016-10-19: 300 mg via INTRAVENOUS
  Filled 2016-10-19: qty 15

## 2016-11-16 ENCOUNTER — Encounter (HOSPITAL_COMMUNITY)
Admission: RE | Admit: 2016-11-16 | Discharge: 2016-11-16 | Disposition: A | Discharge status: Home / Self Care | Payer: Medicaid Other | Source: Ambulatory Visit | Attending: Neurology | Admitting: Neurology

## 2016-11-16 DIAGNOSIS — G35 Multiple sclerosis: Secondary | ICD-10-CM | POA: Diagnosis present

## 2016-11-16 MED ORDER — SODIUM CHLORIDE 0.9 % IV SOLN
Freq: Once | INTRAVENOUS | Status: AC
  Administered 2016-11-16: 11:00:00 via INTRAVENOUS

## 2016-11-16 MED ORDER — SODIUM CHLORIDE 0.9 % IV SOLN
300.0000 mg | Freq: Once | INTRAVENOUS | Status: AC
  Administered 2016-11-16: 300 mg via INTRAVENOUS
  Filled 2016-11-16: qty 15

## 2016-11-27 ENCOUNTER — Other Ambulatory Visit: Payer: Self-pay | Admitting: Family Medicine

## 2016-12-14 ENCOUNTER — Ambulatory Visit (INDEPENDENT_AMBULATORY_CARE_PROVIDER_SITE_OTHER): Payer: Medicaid Other | Admitting: Family Medicine

## 2016-12-14 ENCOUNTER — Encounter: Payer: Self-pay | Admitting: Family Medicine

## 2016-12-14 ENCOUNTER — Encounter (HOSPITAL_COMMUNITY)
Admission: RE | Admit: 2016-12-14 | Discharge: 2016-12-14 | Disposition: A | Discharge status: Home / Self Care | Payer: Medicaid Other | Source: Ambulatory Visit | Attending: Neurology | Admitting: Neurology

## 2016-12-14 ENCOUNTER — Other Ambulatory Visit (HOSPITAL_COMMUNITY)
Admission: RE | Admit: 2016-12-14 | Discharge: 2016-12-14 | Disposition: A | Discharge status: Home / Self Care | Payer: Medicaid Other | Source: Ambulatory Visit | Attending: Family Medicine | Admitting: Family Medicine

## 2016-12-14 VITALS — BP 116/70 | HR 80 | Temp 97.2°F | Resp 18 | Ht 63.0 in | Wt 112.1 lb

## 2016-12-14 DIAGNOSIS — Z23 Encounter for immunization: Secondary | ICD-10-CM

## 2016-12-14 DIAGNOSIS — Z124 Encounter for screening for malignant neoplasm of cervix: Secondary | ICD-10-CM

## 2016-12-14 DIAGNOSIS — G35 Multiple sclerosis: Secondary | ICD-10-CM | POA: Insufficient documentation

## 2016-12-14 DIAGNOSIS — Z1211 Encounter for screening for malignant neoplasm of colon: Secondary | ICD-10-CM | POA: Insufficient documentation

## 2016-12-14 MED ORDER — SODIUM CHLORIDE 0.9 % IV SOLN
300.0000 mg | INTRAVENOUS | Status: DC
  Administered 2016-12-14: 300 mg via INTRAVENOUS
  Filled 2016-12-14: qty 15

## 2016-12-14 MED ORDER — BUPROPION HCL ER (SR) 150 MG PO TB12: ORAL_TABLET | ORAL | 3 refills | Status: DC

## 2016-12-14 MED ORDER — SODIUM CHLORIDE 0.9 % IV SOLN
INTRAVENOUS | Status: DC
  Administered 2016-12-14: 250 mL via INTRAVENOUS

## 2016-12-14 NOTE — Progress Notes (Signed)
Chief Complaint  Patient presents with  . Annual Exam   Here for annual exam mammo and DEXA done No PAP in many years never had colon cancer screening Is under care of neurology for her MS Has general weakness and tremors No new complaint   Patient Active Problem List   Diagnosis Date Noted  . Vitamin D deficiency 03/09/2016  . Multiple sclerosis ([place]) 02/24/2016  . Tobacco abuse 12/11/2015  . Aortic atherosclerosis ([place]) 12/11/2015  . 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 Falls frequently 12/11/2015  . 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 Other emphysema ([place]) 12/11/2015    Outpatient Encounter Prescriptions as of 12/14/2016  Medication Sig  . buPROPion (WELLBUTRIN SR) 150 MG 12 hr tablet TAKE (1) TABLET BY MOUTH TWICE DAILY.  . cholecalciferol (VITAMIN D) 1000 units tablet Take 2,000 Units by mouth daily.  Marland Kitchen dalfampridine (AMPYRA) 10 MG TB12 Take by mouth.  . feeding supplement, ENSURE COMPLETE, (ENSURE COMPLETE) LIQD Take 237 mLs by mouth 2 (two) times daily between meals.  . gabapentin (NEURONTIN) 300 MG capsule Take 1 capsule (300 mg total) by mouth 3 (three) times daily. start with one at bedtime.  Increase as tolerated to TID  . ibuprofen (ADVIL,MOTRIN) 800 MG tablet Take 1 tablet (800 mg total) by mouth every 8 (eight) hours as needed.  . natalizumab 300 mg in sodium chloride 0.9 % 100 mL Inject 300 mg into the vein every 28 (twenty-eight) days.  . propranolol (INDERAL) 20 MG tablet Take 20 mg by mouth 2 (two) times daily.  . traMADol (ULTRAM) 50 MG tablet Take 50 mg by mouth every 6 (six) hours as needed.  . [DISCONTINUED] buPROPion (WELLBUTRIN SR) 150 MG 12 hr tablet TAKE (1) TABLET BY MOUTH TWICE DAILY.   No facility-administered encounter medications on file as of 12/14/2016.     Allergies  Allergen Reactions  . Darvon [Propoxyphene] Other (See Comments)    Hallicuations  . Penicillins Rash    Review of  Systems  Constitutional: Negative for activity change, appetite change and unexpected weight change.       Weight is stable  HENT: Positive for tinnitus and trouble swallowing. Negative for congestion and dental problem.   Eyes: Negative for redness and visual disturbance.  Respiratory: Negative for cough and shortness of breath.   Cardiovascular: Negative for chest pain, palpitations and leg swelling.  Gastrointestinal: Negative for constipation and diarrhea.  Genitourinary: Negative for difficulty urinating, flank pain, vaginal bleeding and vaginal discharge.  Musculoskeletal: Positive for gait problem.       Pain is improved, strength in legs poor  Neurological: Positive for tremors, weakness and numbness. Negative for dizziness and headaches.       Balance is poor  Psychiatric/Behavioral: Negative for dysphoric mood and sleep disturbance. The patient is not nervous/anxious.     BP 116/70 (BP Location: Right Arm, Patient Position: Sitting, Cuff Size: Normal)   Pulse 80   Temp (!) 97.2 F (36.2 C) (Temporal)   Resp 18   Ht 5\' 3"  (1.6 m)   Wt 112 lb 1.3 oz (50.8 kg)   SpO2 94%   BMI 19.85 kg/m   Physical Exam BP 116/70 (BP Location: Right Arm, Patient Position: Sitting, Cuff Size: Normal)   Pulse 80   Temp (!) 97.2 F (36.2 C) (Temporal)   Resp 18   Ht 5\' 3"  (1.6 m)   Wt 112 lb 1.3 oz (50.8 kg)   SpO2  94%   BMI 19.85 kg/m   General Appearance:    Alert, cooperative, Frail, looks older than age.  Uses walker.  Head:    Normocephalic, without obvious abnormality, atraumatic  Eyes:    PERRL, conjunctiva/corneas clear, EOM's intact, fundi    benign, both eyes  Ears:    Normal TM's and external ear canals, both ears  Nose:   Nares normal, septum midline, mucosa normal, no drainage    or sinus tenderness  Throat:   Lips, mucosa, and tongue normal; teeth and gums normal  Neck:   Supple, symmetrical, trachea midline, no adenopathy;    thyroid:  no  enlargement/tenderness/nodules; no carotid   bruit   Back:     Symmetric, no curvature, ROM normal, no CVA tenderness  Lungs:     Clear to auscultation bilaterally, respirations unlabored  Chest Wall:    No tenderness or deformity   Heart:    Regular rate and rhythm, S1 and S2 normal, no murmur, rub   or gallop  Breast Exam:    No tenderness, masses, or nipple abnormality  Abdomen:     Soft, non-tender, bowel sounds active all four quadrants,    no masses, no organomegaly  Genitalia:    Normal female without lesion, discharge or tenderness.  Atrophic skin.  Cervix parous, small polyp at os. PAP done Bimanual neg  Extremities:   Extremities normal, atraumatic, no cyanosis or edema  Pulses:   1+ and symmetric in feet  Skin:   Skin color, texture, turgor normal, no rashes or lesions  Lymph nodes:   Cervical, supraclavicular, and axillary nodes normal  Neurologic:   weakness and atrophy of muscles R>L, complaint of numbness through legs/feet    ASSESSMENT/PLAN:  1. Need for influenza vaccination - Flu Vaccine QUAD 36+ mos IM  2. Encounter for screening for cervical cancer  - Cytology - PAP  3. Screening for colon cancer - Cologuard   Patient Instructions  Continue to eat well and try to stay active I will notify you of your test results I have ordered a cologuard,  This is a home test for colon cancer  See me every 3 months Call sooner for problems    Raylene Everts, MD

## 2016-12-14 NOTE — Patient Instructions (Signed)
Continue to eat well and try to stay active I will notify you of your test results I have ordered a cologuard,  This is a home test for colon cancer  See me every 3 months Call sooner for problems

## 2016-12-15 LAB — CYTOLOGY - PAP
Diagnosis: NEGATIVE
HPV: NOT DETECTED

## 2016-12-16 ENCOUNTER — Encounter: Payer: Self-pay | Admitting: Family Medicine

## 2016-12-18 LAB — COMPLETE METABOLIC PANEL WITH GFR
AG Ratio: 1.7 (calc) (ref 1.0–2.5)
ALT: 10 U/L (ref 6–29)
AST: 15 U/L (ref 10–35)
Albumin: 4.3 g/dL (ref 3.6–5.1)
Alkaline phosphatase (APISO): 61 U/L (ref 33–130)
BUN: 8 mg/dL (ref 7–25)
CO2: 29 mmol/L (ref 20–32)
Calcium: 9.7 mg/dL (ref 8.6–10.4)
Chloride: 99 mmol/L (ref 98–110)
Creat: 0.69 mg/dL (ref 0.50–0.99)
GFR, Est African American: 110 mL/min/{1.73_m2} (ref >=60)
GFR, Est Non African American: 95 mL/min/{1.73_m2} (ref >=60)
Globulin: 2.5 g/dL (calc) (ref 1.9–3.7)
Glucose, Bld: 94 mg/dL (ref 65–139)
Potassium: 4.3 mmol/L (ref 3.5–5.3)
Sodium: 137 mmol/L (ref 135–146)
Total Bilirubin: 0.4 mg/dL (ref 0.2–1.2)
Total Protein: 6.8 g/dL (ref 6.1–8.1)

## 2016-12-18 LAB — URINALYSIS, ROUTINE W REFLEX MICROSCOPIC
Bilirubin Urine: NEGATIVE
Glucose, UA: NEGATIVE
Hgb urine dipstick: NEGATIVE
Ketones, ur: NEGATIVE
Leukocytes, UA: NEGATIVE
Nitrite: NEGATIVE
Protein, ur: NEGATIVE
Specific Gravity, Urine: 1.004 (ref 1.001–1.03)
pH: 7 (ref 5.0–8.0)

## 2016-12-18 LAB — LIPID PANEL
Cholesterol: 208 mg/dL — ABNORMAL HIGH (ref <200)
HDL: 78 mg/dL (ref >50)
LDL Cholesterol (Calc): 105 mg/dL (calc) — ABNORMAL HIGH
Non-HDL Cholesterol (Calc): 130 mg/dL (calc) — ABNORMAL HIGH (ref <130)
Total CHOL/HDL Ratio: 2.7 (calc) (ref <5.0)
Triglycerides: 136 mg/dL (ref <150)

## 2016-12-18 LAB — CBC
HCT: 41.8 % (ref 35.0–45.0)
Hemoglobin: 14.7 g/dL (ref 11.7–15.5)
MCH: 30.9 pg (ref 27.0–33.0)
MCHC: 35.2 g/dL (ref 32.0–36.0)
MCV: 88 fL (ref 80.0–100.0)
MPV: 10.2 fL (ref 7.5–12.5)
Platelets: 305 10*3/uL (ref 140–400)
RBC: 4.75 10*6/uL (ref 3.80–5.10)
RDW: 14.5 % (ref 11.0–15.0)
WBC: 10.3 10*3/uL (ref 3.8–10.8)

## 2016-12-18 LAB — VITAMIN B12: Vitamin B-12: 467 pg/mL (ref 200–1100)

## 2016-12-18 LAB — VITAMIN D 25 HYDROXY (VIT D DEFICIENCY, FRACTURES): Vit D, 25-Hydroxy: 43 ng/mL (ref 30–100)

## 2016-12-18 LAB — TSH: TSH: 2.15 mIU/L (ref 0.40–4.50)

## 2016-12-20 ENCOUNTER — Encounter: Payer: Self-pay | Admitting: Family Medicine

## 2017-01-11 ENCOUNTER — Encounter (HOSPITAL_COMMUNITY): Payer: Medicaid Other

## 2017-01-18 ENCOUNTER — Encounter (HOSPITAL_COMMUNITY)
Admission: RE | Admit: 2017-01-18 | Discharge: 2017-01-18 | Disposition: A | Discharge status: Home / Self Care | Payer: Medicaid Other | Source: Ambulatory Visit | Attending: Neurology | Admitting: Neurology

## 2017-01-18 DIAGNOSIS — G35 Multiple sclerosis: Secondary | ICD-10-CM | POA: Insufficient documentation

## 2017-01-18 MED ORDER — SODIUM CHLORIDE 0.9 % IV SOLN
INTRAVENOUS | Status: DC
  Administered 2017-01-18: 250 mL via INTRAVENOUS

## 2017-01-18 MED ORDER — SODIUM CHLORIDE 0.9 % IV SOLN
300.0000 mg | Freq: Once | INTRAVENOUS | Status: AC
  Administered 2017-01-18: 300 mg via INTRAVENOUS
  Filled 2017-01-18: qty 15

## 2017-02-15 ENCOUNTER — Encounter (HOSPITAL_COMMUNITY)
Admission: RE | Admit: 2017-02-15 | Discharge: 2017-02-15 | Disposition: A | Discharge status: Home / Self Care | Payer: Medicaid Other | Source: Ambulatory Visit | Attending: Neurology | Admitting: Neurology

## 2017-02-15 ENCOUNTER — Encounter (HOSPITAL_COMMUNITY): Payer: Self-pay

## 2017-02-15 DIAGNOSIS — G35 Multiple sclerosis: Secondary | ICD-10-CM | POA: Diagnosis present

## 2017-02-15 MED ORDER — SODIUM CHLORIDE 0.9 % IV SOLN
300.0000 mg | Freq: Once | INTRAVENOUS | Status: AC
  Administered 2017-02-15: 300 mg via INTRAVENOUS
  Filled 2017-02-15: qty 15

## 2017-02-15 MED ORDER — SODIUM CHLORIDE 0.9 % IV SOLN
INTRAVENOUS | Status: DC
  Administered 2017-02-15: 13:00:00 via INTRAVENOUS

## 2017-03-07 ENCOUNTER — Other Ambulatory Visit: Payer: Self-pay | Admitting: Neurology

## 2017-03-07 DIAGNOSIS — G35 Multiple sclerosis: Secondary | ICD-10-CM

## 2017-03-15 ENCOUNTER — Encounter (HOSPITAL_COMMUNITY)
Admission: RE | Admit: 2017-03-15 | Discharge: 2017-03-15 | Disposition: A | Discharge status: Home / Self Care | Payer: Medicaid Other | Source: Ambulatory Visit | Attending: Neurology | Admitting: Neurology

## 2017-03-15 DIAGNOSIS — G35 Multiple sclerosis: Secondary | ICD-10-CM | POA: Diagnosis present

## 2017-03-15 MED ORDER — SODIUM CHLORIDE 0.9 % IV SOLN
300.0000 mg | Freq: Once | INTRAVENOUS | Status: AC
  Administered 2017-03-15: 300 mg via INTRAVENOUS
  Filled 2017-03-15: qty 15

## 2017-03-15 MED ORDER — SODIUM CHLORIDE 0.9 % IV SOLN
INTRAVENOUS | Status: DC
  Administered 2017-03-15: 14:00:00 via INTRAVENOUS

## 2017-03-16 ENCOUNTER — Ambulatory Visit: Payer: Medicaid Other | Admitting: Family Medicine

## 2017-03-16 ENCOUNTER — Ambulatory Visit (HOSPITAL_COMMUNITY)
Admission: RE | Admit: 2017-03-16 | Discharge: 2017-03-16 | Disposition: A | Discharge status: Home / Self Care | Payer: Medicaid Other | Source: Ambulatory Visit | Attending: Neurology | Admitting: Neurology

## 2017-03-16 DIAGNOSIS — G35 Multiple sclerosis: Secondary | ICD-10-CM | POA: Diagnosis present

## 2017-03-16 DIAGNOSIS — M4802 Spinal stenosis, cervical region: Secondary | ICD-10-CM | POA: Insufficient documentation

## 2017-03-16 DIAGNOSIS — M4312 Spondylolisthesis, cervical region: Secondary | ICD-10-CM | POA: Diagnosis not present

## 2017-04-12 ENCOUNTER — Encounter (HOSPITAL_COMMUNITY)
Admission: RE | Admit: 2017-04-12 | Discharge: 2017-04-12 | Disposition: A | Discharge status: Home / Self Care | Payer: Medicaid Other | Source: Ambulatory Visit | Attending: Neurology | Admitting: Neurology

## 2017-04-12 DIAGNOSIS — G35 Multiple sclerosis: Secondary | ICD-10-CM | POA: Diagnosis not present

## 2017-04-12 MED ORDER — SODIUM CHLORIDE 0.9 % IV SOLN
INTRAVENOUS | Status: DC
  Administered 2017-04-12: 13:00:00 via INTRAVENOUS

## 2017-04-12 MED ORDER — SODIUM CHLORIDE 0.9 % IV SOLN
300.0000 mg | INTRAVENOUS | Status: DC
  Administered 2017-04-12: 300 mg via INTRAVENOUS
  Filled 2017-04-12: qty 15

## 2017-05-09 ENCOUNTER — Encounter: Payer: Self-pay | Admitting: Family Medicine

## 2017-05-10 ENCOUNTER — Encounter (HOSPITAL_COMMUNITY)
Admission: RE | Admit: 2017-05-10 | Discharge: 2017-05-10 | Disposition: A | Discharge status: Home / Self Care | Payer: Medicaid Other | Source: Ambulatory Visit | Attending: Neurology | Admitting: Neurology

## 2017-05-10 ENCOUNTER — Encounter (HOSPITAL_COMMUNITY): Payer: Self-pay

## 2017-05-10 DIAGNOSIS — G35 Multiple sclerosis: Secondary | ICD-10-CM | POA: Diagnosis not present

## 2017-05-10 MED ORDER — SODIUM CHLORIDE 0.9 % IV SOLN
INTRAVENOUS | Status: DC
  Administered 2017-05-10: 14:00:00 via INTRAVENOUS

## 2017-05-10 MED ORDER — SODIUM CHLORIDE 0.9 % IV SOLN
300.0000 mg | Freq: Once | INTRAVENOUS | Status: AC
  Administered 2017-05-10: 300 mg via INTRAVENOUS
  Filled 2017-05-10: qty 15

## 2017-05-12 ENCOUNTER — Other Ambulatory Visit: Payer: Self-pay | Admitting: Family Medicine

## 2017-05-19 ENCOUNTER — Ambulatory Visit (INDEPENDENT_AMBULATORY_CARE_PROVIDER_SITE_OTHER): Payer: Medicaid Other | Admitting: Family Medicine

## 2017-05-19 ENCOUNTER — Encounter: Payer: Self-pay | Admitting: Family Medicine

## 2017-05-19 ENCOUNTER — Other Ambulatory Visit: Payer: Self-pay

## 2017-05-19 VITALS — BP 108/74 | HR 72 | Temp 99.1°F | Resp 18 | Ht 63.0 in | Wt 111.0 lb

## 2017-05-19 DIAGNOSIS — H539 Unspecified visual disturbance: Secondary | ICD-10-CM | POA: Diagnosis not present

## 2017-05-19 DIAGNOSIS — Z720 Tobacco use: Secondary | ICD-10-CM | POA: Diagnosis not present

## 2017-05-19 DIAGNOSIS — G35 Multiple sclerosis: Secondary | ICD-10-CM | POA: Diagnosis not present

## 2017-05-19 DIAGNOSIS — Z1211 Encounter for screening for malignant neoplasm of colon: Secondary | ICD-10-CM | POA: Diagnosis not present

## 2017-05-19 NOTE — Progress Notes (Signed)
Chief Complaint  Patient presents with  . Follow-up   Faith [name] is here for routine follow-up. She remains under the care of Dr. Merlene Laughter for her MS.  She gets an infusion every 28 days.  She does not see a big improvement in her status and strength, although she certainly is not any worse.  I told her that she does appear to be a little bit more vigorous today that her last visit.  Her strength is still impaired, she requires a walker at all times in order not to fall.  She also has balance impairment. I reviewed her MRI brain and cervical spine scan from January.  She wants to know her results.  I told her that the MRI of her brain is stable.  Told her that the MRI of her cervical spine does show significant degenerative changes, bone spurs, disc disease, that slightly worse in 2017.  I told her that she also has some MS in her cervical spine that she needs to discuss this with Dr. Merlene Laughter.  She seems unaware she has MS and her spinal cord, and I did not want to discuss these findings with her and not be able to offer her advice.  She does see him next month. She is compliant with her medications.  She is trying to eat well.  She does have [employer] Breakfast every morning to boost her nutrition.  Her weight is stable. She had previously refused colonoscopy.  I ordered a cologuard.  This was refused by her insurance.  Today she tells me she is willing to have a colonoscopy performed if it is safe.  I will refer her to gastroenterology for a consultation. We discussed again her need to quit smoking.  She is cut down to about 3 cigarettes a day.  She really has difficulty getting lower than this.  I did establish with her that her goal needs to be to quit entirely.  Patient Active Problem List   Diagnosis Date Noted  . Vitamin D deficiency 03/09/2016  . Multiple sclerosis ([place]) 02/24/2016  . Tobacco abuse 12/11/2015  . Aortic atherosclerosis ([place]) 12/11/2015  . Degenerative joint disease  (DJD) of lumbar spine 12/11/2015  . 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 Falls frequently 12/11/2015  . 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 Other emphysema ([place]) 12/11/2015    Outpatient Encounter Medications as of 05/19/2017  Medication Sig  . buPROPion (WELLBUTRIN SR) 150 MG 12 hr tablet TAKE (1) TABLET BY MOUTH TWICE DAILY.  . cholecalciferol (VITAMIN D) 1000 units tablet Take 2,000 Units by mouth daily.  . feeding supplement, ENSURE COMPLETE, (ENSURE COMPLETE) LIQD Take 237 mLs by mouth 2 (two) times daily between meals.  . gabapentin (NEURONTIN) 300 MG capsule Take 1 capsule (300 mg total) by mouth 3 (three) times daily. start with one at bedtime.  Increase as tolerated to TID  . IBU 800 MG tablet TAKE (1) TABLET BY MOUTH EVERY EIGHT HOURS AS NEEDED.  . natalizumab 300 mg in sodium chloride 0.9 % 100 mL Inject 300 mg into the vein every 28 (twenty-eight) days.  . propranolol (INDERAL) 20 MG tablet Take 20 mg by mouth 2 (two) times daily.  . traMADol (ULTRAM) 50 MG tablet Take 50 mg by mouth every 6 (six) hours as needed.  . dalfampridine (AMPYRA) 10 MG TB12 Take by mouth.   No facility-administered encounter medications on file as of 05/19/2017.     Allergies  Allergen Reactions  . Darvon [Propoxyphene] Other (See Comments)  Hallicuations  . Penicillins Rash    Review of Systems  Constitutional: Negative for activity change, appetite change and unexpected weight change.       Weight is stable  HENT: Positive for tinnitus and trouble swallowing. Negative for congestion and dental problem.   Eyes: Positive for visual disturbance. Negative for redness.  Respiratory: Negative for cough and shortness of breath.   Cardiovascular: Negative for chest pain, palpitations and leg swelling.  Gastrointestinal: Negative for constipation and diarrhea.  Genitourinary: Negative for difficulty urinating, flank pain, vaginal bleeding and vaginal discharge.  Musculoskeletal: Positive for gait problem.        Pain is improved, strength in legs poor  Neurological: Positive for tremors, weakness and numbness. Negative for dizziness and headaches.       Balance is poor  Psychiatric/Behavioral: Negative for dysphoric mood and sleep disturbance. The patient is not nervous/anxious.    Reviewed review of systems with her.  Unchanged   BP 108/74   Pulse 72   Temp 99.1 F (37.3 C)   Resp 18   Ht 5\' 3"  (1.6 m)   Wt 111 lb 0.6 oz (50.4 kg)   SpO2 98%   BMI 19.67 kg/m   Physical Exam  Constitutional: She is oriented to person, place, and time. She appears well-developed.  Thin, frail.  pleasant and cooperative  HENT:  Head: Normocephalic and atraumatic.  Mouth/Throat: Oropharynx is clear and moist.  Eyes: Pupils are equal, round, and reactive to light. Conjunctivae are normal.  Neck: Normal range of motion.  Cardiovascular: Normal rate, regular rhythm and normal heart sounds.  Pulmonary/Chest: Breath sounds normal. She has no wheezes. She has no rales.  Mild dyspnea with conversation  Neurological: She is alert and oriented to person, place, and time.  Resting tremor, mild Leg weakness- stable.  Stooped posture, small steps  Skin: Skin is warm and dry.  Psychiatric: She has a normal mood and affect. Her behavior is normal.    ASSESSMENT/PLAN:  1. Screening for colon cancer Referred for GI consultation - Ambulatory referral to Gastroenterology  2. MS (multiple sclerosis) ([place]) Under the care of Dr. Merlene Laughter.  She says her vision has declined somewhat so I will refer her for ophthalmology.  It has been more than 2 years since her last eye examination. - Ambulatory referral to Ophthalmology  3. Tobacco abuse Discussed.  Patient smokes very little, and has difficulty quitting  4. Visual disturbances As above - Ambulatory referral to Ophthalmology   Patient Instructions  Exercise every day that you are able No change in medicine Continue to try to eat well  I have ordered a  colonoscopy  Need follow up in six months    Raylene Everts, MD

## 2017-05-19 NOTE — Patient Instructions (Signed)
Exercise every day that you are able No change in medicine Continue to try to eat well  I have ordered a colonoscopy  Need follow up in six months

## 2017-05-20 ENCOUNTER — Encounter: Payer: Self-pay | Admitting: Gastroenterology

## 2017-06-07 ENCOUNTER — Encounter (HOSPITAL_COMMUNITY): Payer: Self-pay

## 2017-06-07 ENCOUNTER — Encounter (HOSPITAL_COMMUNITY)
Admission: RE | Admit: 2017-06-07 | Discharge: 2017-06-07 | Disposition: A | Discharge status: Home / Self Care | Payer: Medicaid Other | Source: Ambulatory Visit | Attending: Neurology | Admitting: Neurology

## 2017-06-07 DIAGNOSIS — G35 Multiple sclerosis: Secondary | ICD-10-CM | POA: Insufficient documentation

## 2017-06-07 MED ORDER — NATALIZUMAB 300 MG/15ML IV CONC
300.0000 mg | Freq: Once | INTRAVENOUS | Status: AC
  Administered 2017-06-07: 300 mg via INTRAVENOUS
  Filled 2017-06-07: qty 15

## 2017-06-07 MED ORDER — SODIUM CHLORIDE 0.9 % IV SOLN
INTRAVENOUS | Status: DC
Start: 2017-06-07 — End: 2017-06-08
  Administered 2017-06-07: 14:00:00 via INTRAVENOUS

## 2017-06-07 MED ORDER — SODIUM CHLORIDE 0.9 % IV SOLN
1000.0000 mg | Freq: Once | INTRAVENOUS | Status: AC
  Administered 2017-06-07: 1000 mg via INTRAVENOUS
  Filled 2017-06-07: qty 8

## 2017-06-08 ENCOUNTER — Encounter (HOSPITAL_COMMUNITY)
Admission: RE | Admit: 2017-06-08 | Discharge: 2017-06-08 | Disposition: A | Discharge status: Home / Self Care | Payer: Medicaid Other | Source: Ambulatory Visit | Attending: Neurology | Admitting: Neurology

## 2017-06-08 DIAGNOSIS — G35 Multiple sclerosis: Secondary | ICD-10-CM | POA: Diagnosis not present

## 2017-06-08 MED ORDER — SODIUM CHLORIDE 0.9 % IV SOLN
Freq: Once | INTRAVENOUS | Status: AC
  Administered 2017-06-08: 250 mL via INTRAVENOUS

## 2017-06-08 MED ORDER — SODIUM CHLORIDE 0.9 % IV SOLN
1000.0000 mg | Freq: Once | INTRAVENOUS | Status: AC
  Administered 2017-06-08: 1000 mg via INTRAVENOUS
  Filled 2017-06-08: qty 8

## 2017-06-09 ENCOUNTER — Encounter (HOSPITAL_COMMUNITY): Payer: Self-pay

## 2017-06-09 ENCOUNTER — Encounter (HOSPITAL_COMMUNITY)
Admission: RE | Admit: 2017-06-09 | Discharge: 2017-06-09 | Disposition: A | Discharge status: Home / Self Care | Payer: Medicaid Other | Source: Ambulatory Visit | Attending: Neurology | Admitting: Neurology

## 2017-06-09 DIAGNOSIS — G35 Multiple sclerosis: Secondary | ICD-10-CM | POA: Diagnosis not present

## 2017-06-09 MED ORDER — SODIUM CHLORIDE 0.9 % IV SOLN
1000.0000 mg | Freq: Once | INTRAVENOUS | Status: AC
  Administered 2017-06-09: 1000 mg via INTRAVENOUS
  Filled 2017-06-09: qty 8

## 2017-06-09 MED ORDER — SODIUM CHLORIDE 0.9 % IV SOLN
INTRAVENOUS | Status: DC
  Administered 2017-06-09: 12:00:00 via INTRAVENOUS

## 2017-07-05 ENCOUNTER — Encounter (HOSPITAL_COMMUNITY)
Admission: RE | Admit: 2017-07-05 | Discharge: 2017-07-05 | Disposition: A | Discharge status: Home / Self Care | Payer: Medicaid Other | Source: Ambulatory Visit | Attending: Neurology | Admitting: Neurology

## 2017-07-05 DIAGNOSIS — G35 Multiple sclerosis: Secondary | ICD-10-CM | POA: Diagnosis not present

## 2017-07-05 MED ORDER — SODIUM CHLORIDE 0.9 % IV SOLN
INTRAVENOUS | Status: DC
  Administered 2017-07-05: 250 mL via INTRAVENOUS

## 2017-07-05 MED ORDER — SODIUM CHLORIDE 0.9 % IV SOLN
300.0000 mg | Freq: Once | INTRAVENOUS | Status: AC
  Administered 2017-07-05: 300 mg via INTRAVENOUS
  Filled 2017-07-05: qty 15

## 2017-07-28 ENCOUNTER — Other Ambulatory Visit: Payer: Self-pay | Admitting: Family Medicine

## 2017-08-02 ENCOUNTER — Encounter (HOSPITAL_COMMUNITY)
Admission: RE | Admit: 2017-08-02 | Discharge: 2017-08-02 | Disposition: A | Discharge status: Home / Self Care | Payer: Medicaid Other | Source: Ambulatory Visit | Attending: Neurology | Admitting: Neurology

## 2017-08-02 ENCOUNTER — Encounter (HOSPITAL_COMMUNITY): Payer: Self-pay

## 2017-08-02 DIAGNOSIS — G35 Multiple sclerosis: Secondary | ICD-10-CM | POA: Insufficient documentation

## 2017-08-02 MED ORDER — SODIUM CHLORIDE 0.9 % IV SOLN
300.0000 mg | Freq: Once | INTRAVENOUS | Status: AC
  Administered 2017-08-02: 300 mg via INTRAVENOUS
  Filled 2017-08-02: qty 15

## 2017-08-02 MED ORDER — SODIUM CHLORIDE 0.9 % IV SOLN
Freq: Once | INTRAVENOUS | Status: AC
  Administered 2017-08-02: 14:00:00 via INTRAVENOUS

## 2017-08-03 ENCOUNTER — Telehealth: Payer: Self-pay | Admitting: Gastroenterology

## 2017-08-03 ENCOUNTER — Encounter: Payer: Self-pay | Admitting: Gastroenterology

## 2017-08-03 ENCOUNTER — Ambulatory Visit: Payer: Medicaid Other | Admitting: Gastroenterology

## 2017-08-03 NOTE — Telephone Encounter (Signed)
PATIENT WAS A NO SHOW AND LETTER SENT  °

## 2017-08-30 ENCOUNTER — Encounter (HOSPITAL_COMMUNITY)
Admission: RE | Admit: 2017-08-30 | Discharge: 2017-08-30 | Disposition: A | Discharge status: Home / Self Care | Payer: Medicaid Other | Source: Ambulatory Visit | Attending: Neurology | Admitting: Neurology

## 2017-08-30 ENCOUNTER — Encounter (HOSPITAL_COMMUNITY): Payer: Self-pay

## 2017-08-30 DIAGNOSIS — G35 Multiple sclerosis: Secondary | ICD-10-CM | POA: Insufficient documentation

## 2017-08-30 MED ORDER — SODIUM CHLORIDE 0.9 % IV SOLN
300.0000 mg | Freq: Once | INTRAVENOUS | Status: AC
  Administered 2017-08-30: 300 mg via INTRAVENOUS
  Filled 2017-08-30: qty 15

## 2017-08-30 MED ORDER — SODIUM CHLORIDE 0.9 % IV SOLN
INTRAVENOUS | Status: DC
  Administered 2017-08-30: 14:00:00 via INTRAVENOUS

## 2017-08-30 NOTE — Progress Notes (Signed)
Patient had an episode of being lightheaded. Blood pressure was 63/39 and patient was laid back in recliner and given fluids. Patient's pressure is now 120/71. Patient stated that she does not eat good or drink a whole lot of fluids. Patient said that it was very hot outside today. I encouraged her to drink more fluids and eat smaller meals throughout the day. Patient stated is is feeling better now.

## 2017-09-27 ENCOUNTER — Encounter (HOSPITAL_COMMUNITY): Payer: Self-pay

## 2017-09-27 ENCOUNTER — Encounter (HOSPITAL_COMMUNITY)
Admission: RE | Admit: 2017-09-27 | Discharge: 2017-09-27 | Disposition: A | Discharge status: Home / Self Care | Payer: Medicaid Other | Source: Ambulatory Visit | Attending: Neurology | Admitting: Neurology

## 2017-09-27 DIAGNOSIS — G35 Multiple sclerosis: Secondary | ICD-10-CM | POA: Diagnosis not present

## 2017-09-27 MED ORDER — SODIUM CHLORIDE 0.9 % IV SOLN
Freq: Once | INTRAVENOUS | Status: AC
  Administered 2017-09-27: 14:00:00 via INTRAVENOUS

## 2017-09-27 MED ORDER — NATALIZUMAB 300 MG/15ML IV CONC
300.0000 mg | Freq: Once | INTRAVENOUS | Status: AC
  Administered 2017-09-27: 300 mg via INTRAVENOUS
  Filled 2017-09-27: qty 15

## 2017-10-25 ENCOUNTER — Encounter (HOSPITAL_COMMUNITY): Payer: Self-pay

## 2017-10-25 ENCOUNTER — Encounter (HOSPITAL_COMMUNITY)
Admission: RE | Admit: 2017-10-25 | Discharge: 2017-10-25 | Disposition: A | Discharge status: Home / Self Care | Payer: Medicaid Other | Source: Ambulatory Visit | Attending: Neurology | Admitting: Neurology

## 2017-10-25 DIAGNOSIS — G35 Multiple sclerosis: Secondary | ICD-10-CM | POA: Diagnosis not present

## 2017-10-25 MED ORDER — SODIUM CHLORIDE 0.9 % IV SOLN
300.0000 mg | Freq: Once | INTRAVENOUS | Status: AC
  Administered 2017-10-25: 300 mg via INTRAVENOUS
  Filled 2017-10-25: qty 15

## 2017-10-25 MED ORDER — SODIUM CHLORIDE 0.9 % IV SOLN
Freq: Once | INTRAVENOUS | Status: AC
  Administered 2017-10-25: 14:00:00 via INTRAVENOUS

## 2017-11-22 ENCOUNTER — Encounter (HOSPITAL_COMMUNITY)
Admission: RE | Admit: 2017-11-22 | Discharge: 2017-11-22 | Disposition: A | Discharge status: Home / Self Care | Payer: Medicaid Other | Source: Ambulatory Visit | Attending: Neurology | Admitting: Neurology

## 2017-11-22 DIAGNOSIS — G35 Multiple sclerosis: Secondary | ICD-10-CM | POA: Insufficient documentation

## 2017-11-22 MED ORDER — SODIUM CHLORIDE 0.9 % IV SOLN
300.0000 mg | Freq: Once | INTRAVENOUS | Status: AC
  Administered 2017-11-22: 300 mg via INTRAVENOUS
  Filled 2017-11-22: qty 15

## 2017-11-22 MED ORDER — SODIUM CHLORIDE 0.9 % IV SOLN: Freq: Once | INTRAVENOUS | Status: DC

## 2017-12-27 ENCOUNTER — Encounter (HOSPITAL_COMMUNITY): Payer: Self-pay

## 2017-12-27 ENCOUNTER — Encounter (HOSPITAL_COMMUNITY)
Admission: RE | Admit: 2017-12-27 | Discharge: 2017-12-27 | Disposition: A | Discharge status: Home / Self Care | Payer: Medicaid Other | Source: Ambulatory Visit | Attending: Neurology | Admitting: Neurology

## 2017-12-27 DIAGNOSIS — G35 Multiple sclerosis: Secondary | ICD-10-CM | POA: Insufficient documentation

## 2017-12-27 MED ORDER — SODIUM CHLORIDE 0.9 % IV SOLN
Freq: Once | INTRAVENOUS | Status: AC
  Administered 2017-12-27: 14:00:00 via INTRAVENOUS

## 2017-12-27 MED ORDER — SODIUM CHLORIDE 0.9 % IV SOLN
300.0000 mg | Freq: Once | INTRAVENOUS | Status: AC
  Administered 2017-12-27: 300 mg via INTRAVENOUS
  Filled 2017-12-27: qty 15

## 2018-01-24 ENCOUNTER — Encounter (HOSPITAL_COMMUNITY)
Admission: RE | Admit: 2018-01-24 | Discharge: 2018-01-24 | Disposition: A | Discharge status: Home / Self Care | Payer: Medicaid Other | Source: Ambulatory Visit | Attending: Neurology | Admitting: Neurology

## 2018-01-24 ENCOUNTER — Encounter (HOSPITAL_COMMUNITY): Payer: Self-pay

## 2018-01-24 DIAGNOSIS — G35 Multiple sclerosis: Secondary | ICD-10-CM | POA: Insufficient documentation

## 2018-01-24 MED ORDER — SODIUM CHLORIDE 0.9 % IV SOLN
300.0000 mg | Freq: Once | INTRAVENOUS | Status: AC
  Administered 2018-01-24: 300 mg via INTRAVENOUS
  Filled 2018-01-24: qty 15

## 2018-01-24 MED ORDER — SODIUM CHLORIDE 0.9 % IV SOLN
Freq: Once | INTRAVENOUS | Status: AC
  Administered 2018-01-24: 14:00:00 via INTRAVENOUS

## 2018-02-24 ENCOUNTER — Encounter (HOSPITAL_COMMUNITY): Payer: Medicaid Other

## 2018-02-28 ENCOUNTER — Encounter (HOSPITAL_COMMUNITY)
Admission: RE | Admit: 2018-02-28 | Discharge: 2018-02-28 | Disposition: A | Discharge status: Home / Self Care | Payer: Medicaid Other | Source: Ambulatory Visit | Attending: Neurology | Admitting: Neurology

## 2018-02-28 DIAGNOSIS — G35 Multiple sclerosis: Secondary | ICD-10-CM | POA: Diagnosis present

## 2018-02-28 MED ORDER — SODIUM CHLORIDE 0.9 % IV SOLN
Freq: Once | INTRAVENOUS | Status: AC
  Administered 2018-02-28: 13:00:00 via INTRAVENOUS

## 2018-02-28 MED ORDER — NATALIZUMAB 300 MG/15ML IV CONC
300.0000 mg | Freq: Once | INTRAVENOUS | Status: AC
  Administered 2018-02-28: 300 mg via INTRAVENOUS
  Filled 2018-02-28: qty 15

## 2018-03-27 DIAGNOSIS — Z139 Encounter for screening, unspecified: Secondary | ICD-10-CM | POA: Diagnosis not present

## 2018-03-27 DIAGNOSIS — Z13220 Encounter for screening for lipoid disorders: Secondary | ICD-10-CM | POA: Diagnosis not present

## 2018-03-27 DIAGNOSIS — Z131 Encounter for screening for diabetes mellitus: Secondary | ICD-10-CM | POA: Diagnosis not present

## 2018-03-27 DIAGNOSIS — R5383 Other fatigue: Secondary | ICD-10-CM | POA: Diagnosis not present

## 2018-03-27 DIAGNOSIS — Z113 Encounter for screening for infections with a predominantly sexual mode of transmission: Secondary | ICD-10-CM | POA: Diagnosis not present

## 2018-03-28 ENCOUNTER — Ambulatory Visit (HOSPITAL_COMMUNITY): Admission: RE | Admit: 2018-03-28 | Payer: Medicaid Other | Source: Ambulatory Visit

## 2018-04-04 ENCOUNTER — Encounter (HOSPITAL_COMMUNITY)
Admission: RE | Admit: 2018-04-04 | Discharge: 2018-04-04 | Disposition: A | Discharge status: Home / Self Care | Payer: Medicaid Other | Source: Ambulatory Visit | Attending: Neurology | Admitting: Neurology

## 2018-04-04 ENCOUNTER — Encounter (HOSPITAL_COMMUNITY): Payer: Self-pay

## 2018-04-04 DIAGNOSIS — G35 Multiple sclerosis: Secondary | ICD-10-CM | POA: Diagnosis not present

## 2018-04-04 MED ORDER — SODIUM CHLORIDE 0.9 % IV SOLN
300.0000 mg | Freq: Once | INTRAVENOUS | Status: AC
  Administered 2018-04-04: 300 mg via INTRAVENOUS
  Filled 2018-04-04: qty 15

## 2018-04-04 MED ORDER — SODIUM CHLORIDE 0.9 % IV SOLN
Freq: Once | INTRAVENOUS | Status: AC
  Administered 2018-04-04: 15:00:00 via INTRAVENOUS

## 2018-04-11 DIAGNOSIS — G35 Multiple sclerosis: Secondary | ICD-10-CM | POA: Diagnosis not present

## 2018-04-11 DIAGNOSIS — M545 Low back pain: Secondary | ICD-10-CM | POA: Diagnosis not present

## 2018-04-11 DIAGNOSIS — G992 Myelopathy in diseases classified elsewhere: Secondary | ICD-10-CM | POA: Diagnosis not present

## 2018-04-12 ENCOUNTER — Other Ambulatory Visit (HOSPITAL_COMMUNITY): Payer: Self-pay | Admitting: Nurse Practitioner

## 2018-04-12 ENCOUNTER — Other Ambulatory Visit: Payer: Self-pay | Admitting: Nurse Practitioner

## 2018-04-12 DIAGNOSIS — F17210 Nicotine dependence, cigarettes, uncomplicated: Secondary | ICD-10-CM

## 2018-04-12 NOTE — Progress Notes (Signed)
Order received to discontinue Tysabri from Dr. Merlene Laughter.  Touch program notified and order scanned in the chart.

## 2018-04-18 ENCOUNTER — Encounter: Payer: Self-pay | Admitting: Podiatry

## 2018-04-18 ENCOUNTER — Ambulatory Visit: Payer: Medicaid Other | Admitting: Podiatry

## 2018-04-18 ENCOUNTER — Ambulatory Visit (INDEPENDENT_AMBULATORY_CARE_PROVIDER_SITE_OTHER): Payer: Medicaid Other

## 2018-04-18 DIAGNOSIS — B351 Tinea unguium: Secondary | ICD-10-CM | POA: Diagnosis not present

## 2018-04-18 DIAGNOSIS — S90852A Superficial foreign body, left foot, initial encounter: Secondary | ICD-10-CM

## 2018-04-18 DIAGNOSIS — D492 Neoplasm of unspecified behavior of bone, soft tissue, and skin: Secondary | ICD-10-CM

## 2018-04-18 NOTE — Progress Notes (Addendum)
Subjective:    Patient ID: Faith [name], female    DOB: Nov 23, 1955, 63 y.o.   MRN: [id number]  HPI 63 year old female presents the office today for concerns of a piece of glass in the bottom of her left foot.  She states that this happened about 1 year ago.  She feels that there is still something poking in her foot.  She has had no recent treatment for this and she had no treatment after she stepped on a foreign body.  She states that she also gets bad muscle cramps to her legs.  She states that the callus area over where she is having the glass has been getting larger.  She is also concerned about discoloration to her toenails and she wants to discuss treatment options for nail fungus.  She denies any redness or drainage or any swelling.  She has no other concerns.   Review of Systems  All other systems reviewed and are negative.  Past Medical History:  Diagnosis Date  . Anemia    my whole life, Used to take iron  . Arthritis    spine, lumbar  . COPD (chronic obstructive pulmonary disease) ([place])   . Emphysema of lung ([place])   . Narcolepsy    by history, has nightmares  . Neuromuscular disorder ([place])    leg issues from spine    Past Surgical History:  Procedure Laterality Date  . EYE SURGERY Right   . ORIF FACIAL FRACTURE    . TUBAL LIGATION    . tubaligation       Current Outpatient Medications:  .  buPROPion (WELLBUTRIN SR) 150 MG 12 hr tablet, TAKE (1) TABLET BY MOUTH TWICE DAILY., Disp: 180 tablet, Rfl: 3 .  cholecalciferol (VITAMIN D) 1000 units tablet, Take 2,000 Units by mouth daily., Disp: , Rfl:  .  dalfampridine (AMPYRA) 10 MG TB12, Take by mouth., Disp: , Rfl:  .  feeding supplement, ENSURE COMPLETE, (ENSURE COMPLETE) LIQD, Take 237 mLs by mouth 2 (two) times daily between meals., Disp: 24 Bottle, Rfl: 2 .  gabapentin (NEURONTIN) 300 MG capsule, Take 1 capsule (300 mg total) by mouth 3 (three) times daily. start with one at bedtime.  Increase as tolerated to TID,  Disp: 180 capsule, Rfl: 3 .  IBU 800 MG tablet, TAKE (1) TABLET BY MOUTH EVERY EIGHT HOURS AS NEEDED., Disp: 90 tablet, Rfl: 1 .  natalizumab 300 mg in sodium chloride 0.9 % 100 mL, Inject 300 mg into the vein every 28 (twenty-eight) days., Disp: , Rfl:  .  propranolol (INDERAL) 20 MG tablet, Take 20 mg by mouth 2 (two) times daily., Disp: , Rfl:  .  traMADol (ULTRAM) 50 MG tablet, Take 50 mg by mouth every 6 (six) hours as needed., Disp: , Rfl:   Allergies  Allergen Reactions  . Darvon [Propoxyphene] Other (See Comments)    Hallicuations  . Penicillins Rash         Objective:   Physical Exam  General: AAO x3, NAD  Dermatological: Thick hyperkeratotic lesion left foot submetatarsal 5.  Upon debridement there is no foreign body that I can identify and there is no puncture wound present.  There is no edema, erythema.  There is no ongoing ulceration.  No clinical signs of infection.  No open lesions.  Nails are dystrophic, hypertrophic with yellow discoloration.  There is no pain in the nails there is no surrounding redness or drainage or any signs of infection.  Vascular: DP, PT pulses  decreased bilaterally.  There is no pain with calf compression, swelling, warmth, erythema.   Neruologic: Overall sensation appears to be intact.  Musculoskeletal: No gross boney pedal deformities bilateral. No pain, crepitus, or limitation noted with foot and ankle range of motion bilateral.  Uses a walker.     Assessment & Plan:  63 year old female with left foot foreign body, onychomycosis -Treatment options discussed including all alternatives, risks, and complications -Etiology of symptoms were discussed -X-rays were obtained and reviewed.  A marker was utilized to identify the area.  There does appear to be a radiodensity present submetatarsal 5 area consistent with a foreign object. -Sharp debrided the hyperkeratotic lesion to the any complications or bleeding.  No underlying ulceration or signs  of infection noted not able to identify foreign body.  Discussed with her possible excision however given her decreased pulses and this is been a chronic issue I like to get arterial studies prior to this.  Arterial studies ordered today.  Discussed moisturizer to the callus area as well as offloading. -Wanting a compound cream for onychomycosis.  This is ordered today through [employer] [facility].  Trula Slade DPM

## 2018-04-19 ENCOUNTER — Telehealth: Payer: Self-pay | Admitting: *Deleted

## 2018-04-19 DIAGNOSIS — R0989 Other specified symptoms and signs involving the circulatory and respiratory systems: Secondary | ICD-10-CM

## 2018-04-19 MED ORDER — NONFORMULARY OR COMPOUNDED ITEM: 5 refills | Status: DC

## 2018-04-19 NOTE — Telephone Encounter (Signed)
[place] [phone] ABI WITH TBI, AUTHORIZATION:  [id number], EFFECTIVE:  04/19/2018, END:  05/19/2018. Faxed to [facility].

## 2018-04-19 NOTE — Telephone Encounter (Signed)
Dr. Jacqualyn Posey ordered [facility] - Antifungal nail treatment. Faxed to [facility].

## 2018-04-19 NOTE — Telephone Encounter (Signed)
Faxed orders to [facility]. 

## 2018-04-19 NOTE — Telephone Encounter (Signed)
-----   Message from Trula Slade, DPM sent at 04/19/2018  8:03 AM EST ----- Sorry if this is a duplicate but can you please order arterial studies due to decreased pulses.

## 2018-04-25 ENCOUNTER — Ambulatory Visit (HOSPITAL_COMMUNITY)
Admission: RE | Admit: 2018-04-25 | Discharge: 2018-04-25 | Disposition: A | Discharge status: Home / Self Care | Payer: Medicaid Other | Source: Ambulatory Visit | Attending: Nurse Practitioner | Admitting: Nurse Practitioner

## 2018-04-25 DIAGNOSIS — L84 Corns and callosities: Secondary | ICD-10-CM | POA: Diagnosis not present

## 2018-04-25 DIAGNOSIS — R030 Elevated blood-pressure reading, without diagnosis of hypertension: Secondary | ICD-10-CM | POA: Diagnosis not present

## 2018-04-25 DIAGNOSIS — F17210 Nicotine dependence, cigarettes, uncomplicated: Secondary | ICD-10-CM | POA: Insufficient documentation

## 2018-04-25 DIAGNOSIS — J019 Acute sinusitis, unspecified: Secondary | ICD-10-CM | POA: Diagnosis not present

## 2018-04-25 DIAGNOSIS — R799 Abnormal finding of blood chemistry, unspecified: Secondary | ICD-10-CM | POA: Diagnosis not present

## 2018-04-28 DIAGNOSIS — D474 Osteomyelofibrosis: Secondary | ICD-10-CM | POA: Diagnosis not present

## 2018-04-28 DIAGNOSIS — Z79899 Other long term (current) drug therapy: Secondary | ICD-10-CM | POA: Diagnosis not present

## 2018-05-02 ENCOUNTER — Inpatient Hospital Stay (HOSPITAL_COMMUNITY): Admission: RE | Admit: 2018-05-02 | Payer: Medicaid Other | Source: Ambulatory Visit

## 2018-05-09 DIAGNOSIS — R030 Elevated blood-pressure reading, without diagnosis of hypertension: Secondary | ICD-10-CM | POA: Diagnosis not present

## 2018-05-16 ENCOUNTER — Other Ambulatory Visit: Payer: Self-pay

## 2018-05-16 ENCOUNTER — Other Ambulatory Visit (HOSPITAL_COMMUNITY): Payer: Self-pay | Admitting: Podiatry

## 2018-05-16 ENCOUNTER — Ambulatory Visit (HOSPITAL_COMMUNITY)
Admission: RE | Admit: 2018-05-16 | Discharge: 2018-05-16 | Disposition: A | Discharge status: Home / Self Care | Payer: Medicaid Other | Source: Ambulatory Visit | Attending: Cardiology | Admitting: Cardiology

## 2018-05-16 ENCOUNTER — Other Ambulatory Visit: Payer: Self-pay | Admitting: *Deleted

## 2018-05-16 DIAGNOSIS — R0989 Other specified symptoms and signs involving the circulatory and respiratory systems: Secondary | ICD-10-CM | POA: Diagnosis not present

## 2018-05-16 DIAGNOSIS — I739 Peripheral vascular disease, unspecified: Secondary | ICD-10-CM

## 2018-05-16 NOTE — Progress Notes (Signed)
ORDER IS PLACED

## 2018-05-19 ENCOUNTER — Telehealth: Payer: Self-pay | Admitting: *Deleted

## 2018-05-19 NOTE — Telephone Encounter (Signed)
-----   Message from Trula Slade, DPM sent at 05/16/2018  5:11 PM EDT ----- Tivis Ringer- can you please put in for a consult with Dr. Gwenlyn Found? Thanks. o

## 2018-05-19 NOTE — Telephone Encounter (Signed)
Left message for pt to call for results  

## 2018-05-31 ENCOUNTER — Ambulatory Visit (HOSPITAL_COMMUNITY)
Admission: RE | Admit: 2018-05-31 | Discharge: 2018-05-31 | Disposition: A | Discharge status: Home / Self Care | Payer: Medicaid Other | Source: Ambulatory Visit | Attending: Cardiovascular Disease | Admitting: Cardiovascular Disease

## 2018-05-31 ENCOUNTER — Encounter (HOSPITAL_COMMUNITY): Payer: Medicaid Other

## 2018-05-31 ENCOUNTER — Encounter (INDEPENDENT_AMBULATORY_CARE_PROVIDER_SITE_OTHER): Payer: Self-pay

## 2018-05-31 ENCOUNTER — Other Ambulatory Visit: Payer: Self-pay

## 2018-05-31 DIAGNOSIS — I739 Peripheral vascular disease, unspecified: Secondary | ICD-10-CM | POA: Diagnosis not present

## 2018-06-06 ENCOUNTER — Telehealth: Payer: Self-pay | Admitting: *Deleted

## 2018-06-06 NOTE — Telephone Encounter (Signed)
Called and left a message for the patient to call me back at the [place] office at [phone]. Lattie Haw

## 2018-06-30 ENCOUNTER — Telehealth: Payer: Self-pay | Admitting: *Deleted

## 2018-06-30 NOTE — Telephone Encounter (Signed)
Called and left a message for the patient to call me back in regards to getting an appointment with Dr Gwenlyn Found for circulation and to give me a call at [phone]. Lattie Haw

## 2018-07-04 DIAGNOSIS — M545 Low back pain: Secondary | ICD-10-CM | POA: Diagnosis not present

## 2018-07-04 DIAGNOSIS — G35 Multiple sclerosis: Secondary | ICD-10-CM | POA: Diagnosis not present

## 2018-07-04 DIAGNOSIS — G992 Myelopathy in diseases classified elsewhere: Secondary | ICD-10-CM | POA: Diagnosis not present

## 2018-07-06 ENCOUNTER — Encounter (HOSPITAL_COMMUNITY): Payer: Medicaid Other

## 2018-07-18 ENCOUNTER — Ambulatory Visit (INDEPENDENT_AMBULATORY_CARE_PROVIDER_SITE_OTHER): Payer: Medicaid Other | Admitting: Nurse Practitioner

## 2018-07-26 DIAGNOSIS — R030 Elevated blood-pressure reading, without diagnosis of hypertension: Secondary | ICD-10-CM | POA: Diagnosis not present

## 2018-07-26 DIAGNOSIS — Z139 Encounter for screening, unspecified: Secondary | ICD-10-CM | POA: Diagnosis not present

## 2018-07-26 DIAGNOSIS — Z760 Encounter for issue of repeat prescription: Secondary | ICD-10-CM | POA: Diagnosis not present

## 2018-07-26 DIAGNOSIS — R799 Abnormal finding of blood chemistry, unspecified: Secondary | ICD-10-CM | POA: Diagnosis not present

## 2018-09-13 ENCOUNTER — Encounter (HOSPITAL_COMMUNITY): Payer: Self-pay | Admitting: Hematology

## 2018-09-13 ENCOUNTER — Inpatient Hospital Stay (HOSPITAL_COMMUNITY): Payer: Medicaid Other

## 2018-09-13 ENCOUNTER — Other Ambulatory Visit: Payer: Self-pay

## 2018-09-13 ENCOUNTER — Inpatient Hospital Stay (HOSPITAL_COMMUNITY): Payer: Medicaid Other | Attending: Hematology | Admitting: Hematology

## 2018-09-13 VITALS — BP 141/85 | HR 70 | Temp 97.8°F | Resp 16 | Wt 100.4 lb

## 2018-09-13 DIAGNOSIS — G35 Multiple sclerosis: Secondary | ICD-10-CM | POA: Diagnosis not present

## 2018-09-13 DIAGNOSIS — M8580 Other specified disorders of bone density and structure, unspecified site: Secondary | ICD-10-CM | POA: Insufficient documentation

## 2018-09-13 DIAGNOSIS — D751 Secondary polycythemia: Secondary | ICD-10-CM

## 2018-09-13 DIAGNOSIS — R319 Hematuria, unspecified: Secondary | ICD-10-CM

## 2018-09-13 DIAGNOSIS — F17210 Nicotine dependence, cigarettes, uncomplicated: Secondary | ICD-10-CM | POA: Diagnosis not present

## 2018-09-13 LAB — LACTATE DEHYDROGENASE: LDH: 189 U/L (ref 98–192)

## 2018-09-13 LAB — CBC WITH DIFFERENTIAL/PLATELET
Abs Immature Granulocytes: 0.02 10*3/uL (ref 0.00–0.07)
Basophils Absolute: 0 10*3/uL (ref 0.0–0.1)
Basophils Relative: 0 %
Eosinophils Absolute: 0 10*3/uL (ref 0.0–0.5)
Eosinophils Relative: 0 %
HCT: 50.7 % — ABNORMAL HIGH (ref 36.0–46.0)
Hemoglobin: 17 g/dL — ABNORMAL HIGH (ref 12.0–15.0)
Immature Granulocytes: 0 %
Lymphocytes Relative: 27 %
Lymphs Abs: 2.2 10*3/uL (ref 0.7–4.0)
MCH: 32 pg (ref 26.0–34.0)
MCHC: 33.5 g/dL (ref 30.0–36.0)
MCV: 95.5 fL (ref 80.0–100.0)
Monocytes Absolute: 0.7 10*3/uL (ref 0.1–1.0)
Monocytes Relative: 9 %
Neutro Abs: 5.3 10*3/uL (ref 1.7–7.7)
Neutrophils Relative %: 64 %
Platelets: 268 10*3/uL (ref 150–400)
RBC: 5.31 MIL/uL — ABNORMAL HIGH (ref 3.87–5.11)
RDW: 13.5 % (ref 11.5–15.5)
WBC: 8.3 10*3/uL (ref 4.0–10.5)
nRBC: 0 % (ref 0.0–0.2)

## 2018-09-13 LAB — URINALYSIS, ROUTINE W REFLEX MICROSCOPIC
Bilirubin Urine: NEGATIVE
Glucose, UA: NEGATIVE mg/dL
Hgb urine dipstick: NEGATIVE
Ketones, ur: NEGATIVE mg/dL
Leukocytes,Ua: NEGATIVE
Nitrite: NEGATIVE
Protein, ur: NEGATIVE mg/dL
Specific Gravity, Urine: 1.004 — ABNORMAL LOW (ref 1.005–1.030)
pH: 5 (ref 5.0–8.0)

## 2018-09-13 NOTE — Patient Instructions (Signed)
[place] at [facility] Discharge Instructions  You were seen today by Dr. Delton Coombes. He went over your history, family history, and how you've been feeling lately. He will get blood work drawn today.  He will see you back in 2 weeks for follow up.   Thank you for choosing [place] at [facility] to provide your oncology and hematology care.  To afford each patient quality time with our provider, please arrive at least 15 minutes before your scheduled appointment time.   If you have a lab appointment with the [facility] please come in thru the  Main Entrance and check in at the main information desk  You need to re-schedule your appointment should you arrive 10 or more minutes late.  We strive to give you quality time with our providers, and arriving late affects you and other patients whose appointments are after yours.  Also, if you no show three or more times for appointments you may be dismissed from the clinic at the providers discretion.     Again, thank you for choosing [facility].  Our hope is that these requests will decrease the amount of time that you wait before being seen by our physicians.       _____________________________________________________________  Should you have questions after your visit to [facility], please contact our office at [phone] between the hours of 8:00 a.m. and 4:30 p.m.  Voicemails left after 4:00 p.m. will not be returned until the following business day.  For prescription refill requests, have your pharmacy contact our office and allow 72 hours.    Cancer Center Support Programs:   > Cancer Support Group  2nd Tuesday of the month 1pm-2pm, Journey Room

## 2018-09-13 NOTE — Progress Notes (Signed)
CONSULT NOTE  Patient Care Team: Doree Albee, MD as PCP - General (Internal Medicine) Danie Binder, MD as Consulting Physician (Gastroenterology)  CHIEF COMPLAINTS/PURPOSE OF CONSULTATION:  Dr. Anastasio Champion  HISTORY OF PRESENTING ILLNESS:  Faith [name] 63 y.o. female is seen for further work-up of elevated hemoglobin and hematocrit at the request of [name], NP at Dr. Lanice Shirts office.  CBC on 03/27/2018 showed elevated hemoglobin of 16.4 and hematocrit of 46 with a normal RBC count.  This was again repeated on 04/25/2018 with hemoglobin 16.2 and hematocrit of 45.5.  White count and platelet count were within normal limits.  She is not on any diuretics.  She reports that she has been smoking 5 cigarettes/day for the last 5 years.  Prior to that she smoked 1 pack/day for 35 years.  Denies any change in cough or hemoptysis.  She had a CT scan lung screening protocol in February which was lung RADS 2.  Denies any fevers, night sweats or weight loss in the last 6 months.  Denies any blood in the urine.  Denies any recurrent infections or recent hospitalizations.  No bleeding per rectum or melena.  Never had a colonoscopy.  Did not have a mammogram in the last couple of years.  Bone density test in 2018 showed osteopenia.  She has MS and has been on Tysabri for a long time.  They stopped Tysabri and are planning to switch to Ocrevus.  She lives at home with son and grandson and walks with the help of walker.  She is able to do some ADLs and IADLs.  She cannot drive.  Family history significant for maternal grandfather who had lung cancer.  No family history of any blood malignancies.    MEDICAL HISTORY:  Past Medical History:  Diagnosis Date  . Anemia    my whole life, Used to take iron  . Arthritis    spine, lumbar  . COPD (chronic obstructive pulmonary disease) ([place])   . Emphysema of lung ([place])   . Narcolepsy    by history, has nightmares  . Neuromuscular disorder ([place])    leg issues  from spine    SURGICAL HISTORY: Past Surgical History:  Procedure Laterality Date  . EYE SURGERY Right   . ORIF FACIAL FRACTURE    . TUBAL LIGATION    . tubaligation      SOCIAL HISTORY: Social History   Socioeconomic History  . Marital status: Legally Separated    Spouse name: Not on file  . Number of children: 3  . Years of education: 45  . Highest education level: Not on file  Occupational History  . Occupation: disabled    Comment: house painting  Social Needs  . Financial resource strain: Not on file  . Food insecurity    Worry: Not on file    Inability: Not on file  . Transportation needs    Medical: Not on file    Non-medical: Not on file  Tobacco Use  . Smoking status: Current Every Day Smoker    Packs/day: 0.25    Years: 45.00    Pack years: 11.25  . Smokeless tobacco: Never Used  . Tobacco comment: cutting down  Substance and Sexual Activity  . Alcohol use: Yes    Alcohol/week: 2.0 standard drinks    Types: 2 Cans of beer per week  . Drug use: No  . Sexual activity: Not Currently    Birth control/protection: Post-menopausal  Lifestyle  . Physical  activity    Days per week: Not on file    Minutes per session: Not on file  . Stress: Not on file  Relationships  . Social Herbalist on phone: Not on file    Gets together: Not on file    Attends religious service: Not on file    Active member of club or organization: Not on file    Attends meetings of clubs or organizations: Not on file    Relationship status: Not on file  . Intimate partner violence    Fear of current or ex partner: Not on file    Emotionally abused: Not on file    Physically abused: Not on file    Forced sexual activity: Not on file  Other Topics Concern  . Not on file  Social History Narrative   Patient lives with her son. [name], age 57, frequently visits    FAMILY HISTORY: Family History  Problem Relation Age of Onset  . Heart attack Mother   .  Kidney failure Mother   . Diabetes Mother   . Heart disease Mother   . Hyperlipidemia Mother   . Hypertension Mother   . Heart attack Father 18  . Hypertension Father   . Hypertension Sister   . Diabetes Sister   . Other Brother        house fire  . Hypertension Brother   . Seizures Brother   . Heart disease Brother        mitral valve  . Arthritis Daughter        DJD, AS fibromyalgia  . Polymyositis Daughter   . Cancer Maternal Grandfather        lung  . Heart disease Maternal Grandfather   . Other Paternal Grandmother        house fire  . Alcohol abuse Son     ALLERGIES:  is allergic to darvon [propoxyphene] and penicillins.  MEDICATIONS:  Current Outpatient Medications  Medication Sig Dispense Refill  . buPROPion (WELLBUTRIN SR) 150 MG 12 hr tablet TAKE (1) TABLET BY MOUTH TWICE DAILY. 180 tablet 3  . cholecalciferol (VITAMIN D) 1000 units tablet Take 2,000 Units by mouth daily.    Marland Kitchen gabapentin (NEURONTIN) 300 MG capsule Take 1 capsule (300 mg total) by mouth 3 (three) times daily. start with one at bedtime.  Increase as tolerated to TID 180 capsule 3  . IBU 800 MG tablet TAKE (1) TABLET BY MOUTH EVERY EIGHT HOURS AS NEEDED. 90 tablet 1  . propranolol (INDERAL) 20 MG tablet Take 20 mg by mouth 2 (two) times daily.    . traMADol (ULTRAM) 50 MG tablet Take 50 mg by mouth every 6 (six) hours as needed.     No current facility-administered medications for this visit.     REVIEW OF SYSTEMS:   Constitutional: Denies fevers, chills or abnormal night sweats Eyes: Denies blurriness of vision, double vision or watery eyes Ears, nose, mouth, throat, and face: Denies mucositis or sore throat Respiratory: Denies cough, dyspnea or wheezes Cardiovascular: Denies palpitation, chest discomfort or lower extremity swelling Gastrointestinal:  Denies nausea, heartburn or change in bowel habits Skin: Denies abnormal skin rashes Lymphatics: Denies new lymphadenopathy or easy  bruising Neurological: Positive for numbness in the feet.  Positive for dizziness. Behavioral/Psych: Mood is stable, no new changes  All other systems were reviewed with the patient and are negative.  PHYSICAL EXAMINATION: ECOG PERFORMANCE STATUS: 2 - Symptomatic, <50% confined to bed  Vitals:  09/13/18 1300  BP: (!) 141/85  Pulse: 70  Resp: 16  Temp: 97.8 F (36.6 C)  SpO2: 97%   Filed Weights   09/13/18 1300  Weight: 100 lb 7 oz (45.6 kg)    GENERAL:alert, no distress and comfortable SKIN: skin color, texture, turgor are normal, no rashes or significant lesions EYES: normal, conjunctiva are pink and non-injected, sclera clear OROPHARYNX:no exudate, no erythema and lips, buccal mucosa, and tongue normal  NECK: supple, thyroid normal size, non-tender, without nodularity LYMPH:  no palpable lymphadenopathy in the cervical, axillary or inguinal LUNGS: clear to auscultation and percussion with normal breathing effort HEART: regular rate & rhythm and no murmurs and no lower extremity edema ABDOMEN:abdomen soft, non-tender and normal bowel sounds Musculoskeletal:no cyanosis of digits and no clubbing  PSYCH: alert & oriented x 3 with fluent speech NEURO: no focal motor/sensory deficits  LABORATORY DATA:  I have reviewed the data as listed I have reviewed labs from Dr. Lanice Shirts office.  RADIOGRAPHIC STUDIES: I have personally reviewed the radiological images as listed and agreed with the findings in the report.  ASSESSMENT & PLAN:  Erythrocytosis 1.  Erythrocytosis: - CBC on 03/27/2018 at Dr. Lanice Shirts office showed elevated hemoglobin of 16.4 and hematocrit of 46.  RBC count was normal. -Repeat CBC on 04/25/2018 shows hemoglobin 16.2 and hematocrit of 45.5.  She is not on any diuretics. - Current active smoker.  Denies any vasomotor symptoms or erythromelalgias.  No aquagenic pruritus. -No B symptoms.  Physical examination today did not reveal any splenomegaly or  lymphadenopathy. -Likely etiology is smoking-related. - We will repeat her CBC today.  We will check another troponin level and LDH.  We will check a UA for hematuria.  If the hemoglobin and hematocrit are elevated, will send Jak 2 [id number] mutation to test for polycythemia. -We will see her back in 2 to 3 weeks for follow-up to discuss results.  2.  Smoking history: -She currently smokes 5 cigarettes/day for the last 5 years.  She has a history of 1 pack/day for 35 years. - Low-dose CT lung cancer screening protocol shows lung RADS 2.  Left adrenal adenoma.  Emphysema.  58-month scan recommended.  3.  Multiple sclerosis: - Usual symptoms are weakness and falls. -Has been on Tysabri which was discontinued.  She is about to start Prue.  4.  Osteopenia: -DEXA scan on 09/30/2016 shows T score of -2.2. - She is taking vitamin D 2000 units daily.  5.  Health maintenance: - She never had a colonoscopy.  She apparently was given Cologuard which she has not completed. - Also did not have mammogram.  I have encouraged her to have mammogram.     All questions were answered. The patient knows to call the clinic with any problems, questions or concerns.      Derek Jack, MD 09/13/18 2:03 PM

## 2018-09-13 NOTE — Assessment & Plan Note (Signed)
1.  Erythrocytosis: - CBC on 03/27/2018 at Dr. Lanice Shirts office showed elevated hemoglobin of 16.4 and hematocrit of 46.  RBC count was normal. -Repeat CBC on 04/25/2018 shows hemoglobin 16.2 and hematocrit of 45.5.  She is not on any diuretics. - Current active smoker.  Denies any vasomotor symptoms or erythromelalgias.  No aquagenic pruritus. -No B symptoms.  Physical examination today did not reveal any splenomegaly or lymphadenopathy. -Likely etiology is smoking-related. - We will repeat her CBC today.  We will check another troponin level and LDH.  We will check a UA for hematuria.  If the hemoglobin and hematocrit are elevated, will send Jak 2 V617F mutation to test for polycythemia. -We will see her back in 2 to 3 weeks for follow-up to discuss results.  2.  Smoking history: -She currently smokes 5 cigarettes/day for the last 5 years.  She has a history of 1 pack/day for 35 years. - Low-dose CT lung cancer screening protocol shows lung RADS 2.  Left adrenal adenoma.  Emphysema.  15-month scan recommended.  3.  Multiple sclerosis: - Usual symptoms are weakness and falls. -Has been on Tysabri which was discontinued.  She is about to start Noel.  4.  Osteopenia: -DEXA scan on 09/30/2016 shows T score of -2.2. - She is taking vitamin D 2000 units daily.  5.  Health maintenance: - She never had a colonoscopy.  She apparently was given Cologuard which she has not completed. - Also did not have mammogram.  I have encouraged her to have mammogram.

## 2018-09-14 LAB — ERYTHROPOIETIN: Erythropoietin: 6.8 m[IU]/mL (ref 2.6–18.5)

## 2018-09-19 LAB — JAK2 GENOTYPR

## 2018-09-28 ENCOUNTER — Ambulatory Visit (HOSPITAL_COMMUNITY): Payer: Medicaid Other | Admitting: Hematology

## 2018-10-17 DIAGNOSIS — R5383 Other fatigue: Secondary | ICD-10-CM | POA: Diagnosis not present

## 2018-10-17 DIAGNOSIS — G35 Multiple sclerosis: Secondary | ICD-10-CM | POA: Diagnosis not present

## 2018-10-17 DIAGNOSIS — K625 Hemorrhage of anus and rectum: Secondary | ICD-10-CM | POA: Diagnosis not present

## 2018-10-18 NOTE — Progress Notes (Deleted)
Referring Provider: Doree Albee, MD Primary Care Physician:  Doree Albee, MD Primary Gastroenterologist:  Dr. Oneida Alar  No chief complaint on file.   HPI:   Faith [name] is a 64 y.o. female presenting today at the request of Dr. Anastasio Champion for rectal bleeding. Past medical history significant for MS, narcolepsy, COPD, anemia now with erythrocytosis and recently seen by Dr. Raliegh Ip with hematology/oncology for further evaluation.     Today she states    Most recent hemoglobin was 17.0 on 09/13/18. Platelets within normal limits.  Past Medical History:  Diagnosis Date  . Anemia    my whole life, Used to take iron  . Arthritis    spine, lumbar  . COPD (chronic obstructive pulmonary disease) ([place])   . Emphysema of lung ([place])   . Narcolepsy    by history, has nightmares  . Neuromuscular disorder ([place])    leg issues from spine    Past Surgical History:  Procedure Laterality Date  . EYE SURGERY Right   . ORIF FACIAL FRACTURE    . TUBAL LIGATION    . tubaligation      Current Outpatient Medications  Medication Sig Dispense Refill  . buPROPion (WELLBUTRIN SR) 150 MG 12 hr tablet TAKE (1) TABLET BY MOUTH TWICE DAILY. 180 tablet 3  . cholecalciferol (VITAMIN D) 1000 units tablet Take 2,000 Units by mouth daily.    Marland Kitchen gabapentin (NEURONTIN) 300 MG capsule Take 1 capsule (300 mg total) by mouth 3 (three) times daily. start with one at bedtime.  Increase as tolerated to TID 180 capsule 3  . IBU 800 MG tablet TAKE (1) TABLET BY MOUTH EVERY EIGHT HOURS AS NEEDED. 90 tablet 1  . propranolol (INDERAL) 20 MG tablet Take 20 mg by mouth 2 (two) times daily.    . traMADol (ULTRAM) 50 MG tablet Take 50 mg by mouth every 6 (six) hours as needed.     No current facility-administered medications for this visit.     Allergies as of 10/19/2018 - Review Complete 09/13/2018  Allergen Reaction Noted  . Darvon [propoxyphene] Other (See Comments) 04/30/2016  . Penicillins Rash 10/14/2015     Family History  Problem Relation Age of Onset  . Heart attack Mother   . Kidney failure Mother   . Diabetes Mother   . Heart disease Mother   . Hyperlipidemia Mother   . Hypertension Mother   . Heart attack Father 59  . Hypertension Father   . Hypertension Sister   . Diabetes Sister   . Other Brother        house fire  . Hypertension Brother   . Seizures Brother   . Heart disease Brother        mitral valve  . Arthritis Daughter        DJD, AS fibromyalgia  . Polymyositis Daughter   . Cancer Maternal Grandfather        lung  . Heart disease Maternal Grandfather   . Other Paternal Grandmother        house fire  . Alcohol abuse Son     Social History   Socioeconomic History  . Marital status: Legally Separated    Spouse name: Not on file  . Number of children: 3  . Years of education: 22  . Highest education level: Not on file  Occupational History  . Occupation: disabled    Comment: house painting  Social Needs  . Financial resource strain: Not on file  .  Food insecurity    Worry: Not on file    Inability: Not on file  . Transportation needs    Medical: Not on file    Non-medical: Not on file  Tobacco Use  . Smoking status: Current Every Day Smoker    Packs/day: 0.25    Years: 45.00    Pack years: 11.25  . Smokeless tobacco: Never Used  . Tobacco comment: cutting down  Substance and Sexual Activity  . Alcohol use: Yes    Alcohol/week: 2.0 standard drinks    Types: 2 Cans of beer per week  . Drug use: No  . Sexual activity: Not Currently    Birth control/protection: Post-menopausal  Lifestyle  . Physical activity    Days per week: Not on file    Minutes per session: Not on file  . Stress: Not on file  Relationships  . Social Herbalist on phone: Not on file    Gets together: Not on file    Attends religious service: Not on file    Active member of club or organization: Not on file    Attends meetings of clubs or organizations: Not on  file    Relationship status: Not on file  . Intimate partner violence    Fear of current or ex partner: Not on file    Emotionally abused: Not on file    Physically abused: Not on file    Forced sexual activity: Not on file  Other Topics Concern  . Not on file  Social History Narrative   Patient lives with her son. [name], age 45, frequently visits    Review of Systems: Gen: Denies any fever, chills, fatigue, weight loss, lack of appetite.  CV: Denies chest pain, heart palpitations, peripheral edema, syncope.  Resp: Denies shortness of breath at rest or with exertion. Denies wheezing or cough.  GI: Denies dysphagia or odynophagia. Denies jaundice, hematemesis, fecal incontinence. GU : Denies urinary burning, urinary frequency, urinary hesitancy MS: Denies joint pain, muscle weakness, cramps, or limitation of movement.  Derm: Denies rash, itching, dry skin Psych: Denies depression, anxiety, memory loss, and confusion Heme: Denies bruising, bleeding, and enlarged lymph nodes.  Physical Exam: There were no vitals taken for this visit. General:   Alert and oriented. Pleasant and cooperative. Well-nourished and well-developed.  Head:  Normocephalic and atraumatic. Eyes:  Without icterus, sclera clear and conjunctiva pink.  Ears:  Normal auditory acuity. Nose:  No deformity, discharge,  or lesions. Mouth:  No deformity or lesions, oral mucosa pink.  Neck:  Supple, without mass or thyromegaly. Lungs:  Clear to auscultation bilaterally. No wheezes, rales, or rhonchi. No distress.  Heart:  S1, S2 present without murmurs appreciated.  Abdomen:  +BS, soft, non-tender and non-distended. No HSM noted. No guarding or rebound. No masses appreciated.  Rectal:  Deferred  Msk:  Symmetrical without gross deformities. Normal posture. Pulses:  Normal pulses noted. Extremities:  Without clubbing or edema. Neurologic:  Alert and  oriented x4;  grossly normal neurologically. Skin:  Intact  without significant lesions or rashes. Cervical Nodes:  No significant cervical adenopathy. Psych:  Alert and cooperative. Normal mood and affect.

## 2018-10-19 ENCOUNTER — Encounter: Payer: Self-pay | Admitting: Gastroenterology

## 2018-10-19 ENCOUNTER — Telehealth: Payer: Self-pay | Admitting: Gastroenterology

## 2018-10-19 ENCOUNTER — Ambulatory Visit: Payer: Medicaid Other | Admitting: Gastroenterology

## 2018-10-19 NOTE — Telephone Encounter (Signed)
PATIENT WAS A NO SHOW AND LETTER SENT  °

## 2018-10-27 ENCOUNTER — Telehealth (HOSPITAL_COMMUNITY): Payer: Self-pay | Admitting: *Deleted

## 2018-10-27 NOTE — Telephone Encounter (Signed)
We have attempted to contact this patients multiple times since May to schedule for Ocrevus.  Messages have been left and she has been non compliant with returning our calls.  Please advise.  Thank you.

## 2018-10-31 DIAGNOSIS — G992 Myelopathy in diseases classified elsewhere: Secondary | ICD-10-CM | POA: Diagnosis not present

## 2018-10-31 DIAGNOSIS — G35 Multiple sclerosis: Secondary | ICD-10-CM | POA: Diagnosis not present

## 2018-10-31 DIAGNOSIS — Z79899 Other long term (current) drug therapy: Secondary | ICD-10-CM | POA: Diagnosis not present

## 2018-10-31 DIAGNOSIS — M545 Low back pain: Secondary | ICD-10-CM | POA: Diagnosis not present

## 2018-11-03 ENCOUNTER — Encounter (HOSPITAL_COMMUNITY)
Admission: RE | Admit: 2018-11-03 | Discharge: 2018-11-03 | Disposition: A | Discharge status: Home / Self Care | Payer: Medicaid Other | Source: Ambulatory Visit | Attending: Neurology | Admitting: Neurology

## 2018-11-03 ENCOUNTER — Other Ambulatory Visit: Payer: Self-pay

## 2018-11-03 ENCOUNTER — Encounter (HOSPITAL_COMMUNITY): Admission: RE | Admit: 2018-11-03 | Payer: Medicaid Other | Source: Ambulatory Visit

## 2018-11-03 DIAGNOSIS — G35 Multiple sclerosis: Secondary | ICD-10-CM | POA: Insufficient documentation

## 2018-11-03 MED ORDER — SODIUM CHLORIDE 0.9 % IV SOLN
300.0000 mg | Freq: Once | INTRAVENOUS | Status: AC
  Administered 2018-11-03: 300 mg via INTRAVENOUS
  Filled 2018-11-03: qty 10

## 2018-11-03 MED ORDER — DIPHENHYDRAMINE HCL 25 MG PO CAPS
50.0000 mg | ORAL_CAPSULE | Freq: Once | ORAL | Status: AC
  Administered 2018-11-03: 11:00:00 50 mg via ORAL
  Filled 2018-11-03: qty 2

## 2018-11-03 MED ORDER — SODIUM CHLORIDE 0.9 % IV SOLN
500.0000 mg | Freq: Once | INTRAVENOUS | Status: AC
  Administered 2018-11-03: 500 mg via INTRAVENOUS
  Filled 2018-11-03: qty 4

## 2018-11-03 MED ORDER — SODIUM CHLORIDE 0.9 % IV SOLN
Freq: Once | INTRAVENOUS | Status: AC
  Administered 2018-11-03: 10:00:00 via INTRAVENOUS

## 2018-11-03 NOTE — Progress Notes (Addendum)
Here for Ocrevus infusion. Written consent obtained and sent to be scanned into chart.

## 2018-11-03 NOTE — Progress Notes (Signed)
ocrevus infusion complete. VSS. Voices no c/o at this time. Tolerated well. D/C to home in good condition.

## 2018-11-17 ENCOUNTER — Encounter (HOSPITAL_COMMUNITY)
Admission: RE | Admit: 2018-11-17 | Discharge: 2018-11-17 | Disposition: A | Discharge status: Home / Self Care | Payer: Medicaid Other | Source: Ambulatory Visit | Attending: Neurology | Admitting: Neurology

## 2018-11-17 ENCOUNTER — Other Ambulatory Visit: Payer: Self-pay

## 2018-11-17 MED ORDER — DIPHENHYDRAMINE HCL 50 MG/ML IJ SOLN: 25.0000 mg | Freq: Once | INTRAMUSCULAR | Status: DC

## 2018-11-17 MED ORDER — SODIUM CHLORIDE 0.9 % IV SOLN
300.0000 mg | Freq: Once | INTRAVENOUS | Status: DC
  Filled 2018-11-17: qty 10

## 2018-11-17 MED ORDER — SODIUM CHLORIDE 0.9 % IV SOLN
500.0000 mg | Freq: Once | INTRAVENOUS | Status: DC
  Filled 2018-11-17: qty 4

## 2018-11-17 MED ORDER — ACETAMINOPHEN 325 MG PO TABS: 650.0000 mg | ORAL_TABLET | Freq: Once | ORAL | Status: DC

## 2018-11-17 NOTE — Progress Notes (Signed)
Patient arrived for second Ocrevus infusion.  Had called patient prior to visit to ensure she was well enough to make appointment.  She stated that she would be here.  Upon assessment, she and daughter state that she has had low grade fever and has not felt well since last infusion.  States that she contacted Dr. Kari Baars office after her last infusion and has not heard back from them.  Contacted office and they stated that she was instructed to wait two weeks from September 11th, however this was not communicated to [place] and the patient did not recall speaking with office.  Advised she and her daughter to continue monitoring symptoms and contact Dr Kari Baars office for an appointment and call us to cancel appointment on September 25th if advised to do so.  Will follow up on 9/24 with office and patient to verify status.

## 2018-11-23 NOTE — Progress Notes (Signed)
After speaking with patient, she states that she continues having dizzy spells, weakness and low grade fevers every night.  States she does not think that Ocrevus is what she needs.  Attempted to contact daughter and got VM.  Spoke to [place] at Dr. Kari Baars office to relay concerns.  Will await further instruction.

## 2018-11-24 ENCOUNTER — Encounter (HOSPITAL_COMMUNITY)
Admission: RE | Admit: 2018-11-24 | Discharge: 2018-11-24 | Disposition: A | Discharge status: Home / Self Care | Payer: Medicaid Other | Source: Ambulatory Visit | Attending: Neurology | Admitting: Neurology

## 2018-11-29 ENCOUNTER — Telehealth (INDEPENDENT_AMBULATORY_CARE_PROVIDER_SITE_OTHER): Payer: Self-pay | Admitting: Internal Medicine

## 2018-11-29 NOTE — Telephone Encounter (Signed)
Pt was called. No answer; left voice mail to go to local pharmacy to pick up OTC /nix for head lice. Try this product 1st; if it does not improve, to call us back.

## 2018-11-29 NOTE — Telephone Encounter (Signed)
Please call patient and tell her that she can take Nix, this is over-the-counter for head lice.  If she does not improve after this, she can call us back.

## 2019-01-17 ENCOUNTER — Encounter (HOSPITAL_COMMUNITY): Payer: Self-pay | Admitting: Emergency Medicine

## 2019-01-17 ENCOUNTER — Other Ambulatory Visit: Payer: Self-pay

## 2019-01-17 ENCOUNTER — Inpatient Hospital Stay (HOSPITAL_COMMUNITY)
Admission: EM | Admit: 2019-01-17 | Discharge: 2019-01-22 | DRG: 472 | Disposition: A | Discharge status: Rehabilitation Facility | Payer: Medicaid Other | Attending: Internal Medicine | Admitting: Internal Medicine

## 2019-01-17 DIAGNOSIS — Z801 Family history of malignant neoplasm of trachea, bronchus and lung: Secondary | ICD-10-CM

## 2019-01-17 DIAGNOSIS — G9529 Other cord compression: Secondary | ICD-10-CM | POA: Diagnosis present

## 2019-01-17 DIAGNOSIS — R471 Dysarthria and anarthria: Secondary | ICD-10-CM | POA: Diagnosis present

## 2019-01-17 DIAGNOSIS — M40292 Other kyphosis, cervical region: Secondary | ICD-10-CM | POA: Diagnosis present

## 2019-01-17 DIAGNOSIS — M7138 Other bursal cyst, other site: Secondary | ICD-10-CM | POA: Diagnosis not present

## 2019-01-17 DIAGNOSIS — G8929 Other chronic pain: Secondary | ICD-10-CM | POA: Diagnosis present

## 2019-01-17 DIAGNOSIS — M4312 Spondylolisthesis, cervical region: Principal | ICD-10-CM | POA: Diagnosis present

## 2019-01-17 DIAGNOSIS — Z79899 Other long term (current) drug therapy: Secondary | ICD-10-CM

## 2019-01-17 DIAGNOSIS — Z20828 Contact with and (suspected) exposure to other viral communicable diseases: Secondary | ICD-10-CM | POA: Diagnosis present

## 2019-01-17 DIAGNOSIS — Z79891 Long term (current) use of opiate analgesic: Secondary | ICD-10-CM | POA: Diagnosis not present

## 2019-01-17 DIAGNOSIS — J439 Emphysema, unspecified: Secondary | ICD-10-CM | POA: Diagnosis present

## 2019-01-17 DIAGNOSIS — R296 Repeated falls: Secondary | ICD-10-CM

## 2019-01-17 DIAGNOSIS — Z716 Tobacco abuse counseling: Secondary | ICD-10-CM | POA: Diagnosis not present

## 2019-01-17 DIAGNOSIS — Z981 Arthrodesis status: Secondary | ICD-10-CM | POA: Diagnosis not present

## 2019-01-17 DIAGNOSIS — G35 Multiple sclerosis: Secondary | ICD-10-CM | POA: Diagnosis present

## 2019-01-17 DIAGNOSIS — K592 Neurogenic bowel, not elsewhere classified: Secondary | ICD-10-CM | POA: Diagnosis present

## 2019-01-17 DIAGNOSIS — Z880 Allergy status to penicillin: Secondary | ICD-10-CM

## 2019-01-17 DIAGNOSIS — M549 Dorsalgia, unspecified: Secondary | ICD-10-CM

## 2019-01-17 DIAGNOSIS — Z23 Encounter for immunization: Secondary | ICD-10-CM

## 2019-01-17 DIAGNOSIS — M5023 Other cervical disc displacement, cervicothoracic region: Secondary | ICD-10-CM | POA: Diagnosis not present

## 2019-01-17 DIAGNOSIS — M2578 Osteophyte, vertebrae: Secondary | ICD-10-CM | POA: Diagnosis present

## 2019-01-17 DIAGNOSIS — Z833 Family history of diabetes mellitus: Secondary | ICD-10-CM | POA: Diagnosis not present

## 2019-01-17 DIAGNOSIS — M4802 Spinal stenosis, cervical region: Secondary | ICD-10-CM | POA: Diagnosis not present

## 2019-01-17 DIAGNOSIS — Z720 Tobacco use: Secondary | ICD-10-CM | POA: Diagnosis present

## 2019-01-17 DIAGNOSIS — Z419 Encounter for procedure for purposes other than remedying health state, unspecified: Secondary | ICD-10-CM

## 2019-01-17 DIAGNOSIS — M545 Low back pain: Secondary | ICD-10-CM

## 2019-01-17 DIAGNOSIS — F17210 Nicotine dependence, cigarettes, uncomplicated: Secondary | ICD-10-CM | POA: Diagnosis present

## 2019-01-17 DIAGNOSIS — N951 Menopausal and female climacteric states: Secondary | ICD-10-CM | POA: Diagnosis present

## 2019-01-17 DIAGNOSIS — M47892 Other spondylosis, cervical region: Secondary | ICD-10-CM | POA: Diagnosis present

## 2019-01-17 DIAGNOSIS — M5412 Radiculopathy, cervical region: Secondary | ICD-10-CM | POA: Diagnosis present

## 2019-01-17 DIAGNOSIS — Z8349 Family history of other endocrine, nutritional and metabolic diseases: Secondary | ICD-10-CM | POA: Diagnosis not present

## 2019-01-17 DIAGNOSIS — E876 Hypokalemia: Secondary | ICD-10-CM | POA: Diagnosis not present

## 2019-01-17 DIAGNOSIS — R270 Ataxia, unspecified: Secondary | ICD-10-CM | POA: Diagnosis present

## 2019-01-17 DIAGNOSIS — Z8249 Family history of ischemic heart disease and other diseases of the circulatory system: Secondary | ICD-10-CM | POA: Diagnosis not present

## 2019-01-17 DIAGNOSIS — R531 Weakness: Secondary | ICD-10-CM | POA: Diagnosis not present

## 2019-01-17 DIAGNOSIS — J449 Chronic obstructive pulmonary disease, unspecified: Secondary | ICD-10-CM | POA: Diagnosis not present

## 2019-01-17 DIAGNOSIS — Z888 Allergy status to other drugs, medicaments and biological substances status: Secondary | ICD-10-CM

## 2019-01-17 DIAGNOSIS — M7130 Other bursal cyst, unspecified site: Secondary | ICD-10-CM

## 2019-01-17 DIAGNOSIS — R509 Fever, unspecified: Secondary | ICD-10-CM | POA: Diagnosis not present

## 2019-01-17 DIAGNOSIS — M4710 Other spondylosis with myelopathy, site unspecified: Secondary | ICD-10-CM | POA: Diagnosis not present

## 2019-01-17 DIAGNOSIS — Z841 Family history of disorders of kidney and ureter: Secondary | ICD-10-CM

## 2019-01-17 DIAGNOSIS — I10 Essential (primary) hypertension: Secondary | ICD-10-CM | POA: Diagnosis not present

## 2019-01-17 DIAGNOSIS — G959 Disease of spinal cord, unspecified: Secondary | ICD-10-CM | POA: Diagnosis not present

## 2019-01-17 DIAGNOSIS — G9520 Unspecified cord compression: Secondary | ICD-10-CM | POA: Diagnosis not present

## 2019-01-17 DIAGNOSIS — M47812 Spondylosis without myelopathy or radiculopathy, cervical region: Secondary | ICD-10-CM | POA: Diagnosis not present

## 2019-01-17 DIAGNOSIS — Z4789 Encounter for other orthopedic aftercare: Secondary | ICD-10-CM | POA: Diagnosis not present

## 2019-01-17 DIAGNOSIS — M40202 Unspecified kyphosis, cervical region: Secondary | ICD-10-CM | POA: Diagnosis not present

## 2019-01-17 LAB — CBC WITH DIFFERENTIAL/PLATELET
Abs Immature Granulocytes: 0.03 10*3/uL (ref 0.00–0.07)
Basophils Absolute: 0 10*3/uL (ref 0.0–0.1)
Basophils Relative: 0 %
Eosinophils Absolute: 0 10*3/uL (ref 0.0–0.5)
Eosinophils Relative: 0 %
HCT: 47.7 % — ABNORMAL HIGH (ref 36.0–46.0)
Hemoglobin: 16.4 g/dL — ABNORMAL HIGH (ref 12.0–15.0)
Immature Granulocytes: 0 %
Lymphocytes Relative: 26 %
Lymphs Abs: 2.2 10*3/uL (ref 0.7–4.0)
MCH: 34.5 pg — ABNORMAL HIGH (ref 26.0–34.0)
MCHC: 34.4 g/dL (ref 30.0–36.0)
MCV: 100.4 fL — ABNORMAL HIGH (ref 80.0–100.0)
Monocytes Absolute: 0.8 10*3/uL (ref 0.1–1.0)
Monocytes Relative: 9 %
Neutro Abs: 5.3 10*3/uL (ref 1.7–7.7)
Neutrophils Relative %: 65 %
Platelets: 333 10*3/uL (ref 150–400)
RBC: 4.75 MIL/uL (ref 3.87–5.11)
RDW: 12.4 % (ref 11.5–15.5)
WBC: 8.3 10*3/uL (ref 4.0–10.5)
nRBC: 0 % (ref 0.0–0.2)

## 2019-01-17 LAB — BASIC METABOLIC PANEL
Anion gap: 9 (ref 5–15)
BUN: 13 mg/dL (ref 8–23)
CO2: 25 mmol/L (ref 22–32)
Calcium: 8.9 mg/dL (ref 8.9–10.3)
Chloride: 103 mmol/L (ref 98–111)
Creatinine, Ser: 0.79 mg/dL (ref 0.44–1.00)
GFR calc Af Amer: >60 mL/min (ref >60)
GFR calc non Af Amer: >60 mL/min (ref >60)
Glucose, Bld: 110 mg/dL — ABNORMAL HIGH (ref 70–99)
Potassium: 4.1 mmol/L (ref 3.5–5.1)
Sodium: 137 mmol/L (ref 135–145)

## 2019-01-17 MED ORDER — METHYLPREDNISOLONE SODIUM SUCC 1000 MG IJ SOLR
INTRAMUSCULAR | Status: AC
  Filled 2019-01-17: qty 8

## 2019-01-17 MED ORDER — ENOXAPARIN SODIUM 30 MG/0.3ML ~~LOC~~ SOLN
30.0000 mg | SUBCUTANEOUS | Status: DC
  Administered 2019-01-18 – 2019-01-19 (×2): 30 mg via SUBCUTANEOUS
  Filled 2019-01-17 (×2): qty 0.3

## 2019-01-17 MED ORDER — SODIUM CHLORIDE 0.9 % IV SOLN
1000.0000 mg | Freq: Once | INTRAVENOUS | Status: AC
  Administered 2019-01-17: 1000 mg via INTRAVENOUS
  Filled 2019-01-17: qty 8

## 2019-01-17 MED ORDER — GABAPENTIN 300 MG PO CAPS
600.0000 mg | ORAL_CAPSULE | Freq: Three times a day (TID) | ORAL | Status: DC
  Administered 2019-01-18 – 2019-01-22 (×12): 600 mg via ORAL
  Filled 2019-01-17: qty 2
  Filled 2019-01-17: qty 6
  Filled 2019-01-17 (×5): qty 2
  Filled 2019-01-17: qty 6
  Filled 2019-01-17 (×2): qty 2
  Filled 2019-01-17: qty 6
  Filled 2019-01-17 (×2): qty 2

## 2019-01-17 MED ORDER — VITAMIN D 25 MCG (1000 UNIT) PO TABS
2000.0000 [IU] | ORAL_TABLET | Freq: Two times a day (BID) | ORAL | Status: DC
  Administered 2019-01-18 – 2019-01-22 (×8): 2000 [IU] via ORAL
  Filled 2019-01-17 (×13): qty 2

## 2019-01-17 MED ORDER — ACETAMINOPHEN 325 MG PO TABS: 650.0000 mg | ORAL_TABLET | Freq: Four times a day (QID) | ORAL | Status: DC | PRN

## 2019-01-17 MED ORDER — SODIUM CHLORIDE 0.9 % IV SOLN
1000.0000 mg | INTRAVENOUS | Status: DC
  Administered 2019-01-18: 1000 mg via INTRAVENOUS
  Filled 2019-01-17 (×3): qty 8

## 2019-01-17 MED ORDER — METHYLPREDNISOLONE SODIUM SUCC 125 MG IJ SOLR: 1000.0000 mg | Freq: Once | INTRAMUSCULAR | Status: DC

## 2019-01-17 MED ORDER — TRAMADOL HCL 50 MG PO TABS
100.0000 mg | ORAL_TABLET | Freq: Four times a day (QID) | ORAL | Status: DC | PRN
  Administered 2019-01-18: 50 mg via ORAL
  Administered 2019-01-18 – 2019-01-21 (×7): 100 mg via ORAL
  Filled 2019-01-17 (×8): qty 2

## 2019-01-17 MED ORDER — PROPRANOLOL HCL 20 MG PO TABS
60.0000 mg | ORAL_TABLET | Freq: Two times a day (BID) | ORAL | Status: DC
  Administered 2019-01-18 – 2019-01-22 (×9): 60 mg via ORAL
  Filled 2019-01-17 (×10): qty 3

## 2019-01-17 MED ORDER — ACETAMINOPHEN 650 MG RE SUPP: 650.0000 mg | Freq: Four times a day (QID) | RECTAL | Status: DC | PRN

## 2019-01-17 MED ORDER — NICOTINE 7 MG/24HR TD PT24
7.0000 mg | MEDICATED_PATCH | Freq: Every day | TRANSDERMAL | Status: DC | PRN
  Filled 2019-01-17: qty 1

## 2019-01-17 MED ORDER — SODIUM CHLORIDE 0.9% FLUSH
3.0000 mL | Freq: Two times a day (BID) | INTRAVENOUS | Status: DC
  Administered 2019-01-18 – 2019-01-22 (×6): 3 mL via INTRAVENOUS

## 2019-01-17 NOTE — ED Provider Notes (Signed)
[facility] EMERGENCY DEPARTMENT Provider Note   CSN: [id number] Arrival date & time: 01/17/19  1557     History   Chief Complaint Chief Complaint  Patient presents with  . Numbness    HPI Faith [name] is a 63 y.o. female with history of MS, COPD who presents with a 1 month history of numbness and weakness to bilateral hands and feet.  Patient ports that he feels asleep.  She has been dropping things.  She is not able to lift her legs very much.  She denies any chest pain, shortness of breath, abdominal pain, nausea, vomiting, urinary symptoms.  Patient reports she has been having hot flashes and feeling like she has a fever, however temperature always under 100, since infusion of Ocrevus 2 months ago.  She reports she has had headaches since then as well.  She has not had any further infusions due to this reaction.  She reports the headaches and hot flashes have since lessened.  She denies any new back pain, but states she does have chronic back pain.  Patient has not been able to walk very well and has really only been able to drag her feet using her walker.     HPI  Past Medical History:  Diagnosis Date  . Anemia    my whole life, Used to take iron  . Arthritis    spine, lumbar  . COPD (chronic obstructive pulmonary disease) ([place])   . Emphysema of lung ([place])   . Narcolepsy    by history, has nightmares  . Neuromuscular disorder ([place])    leg issues from spine    Patient Active Problem List   Diagnosis Date Noted  . Multiple sclerosis exacerbation ([place]) 01/17/2019  . Erythrocytosis 09/13/2018  . Vitamin D deficiency 03/09/2016  . Multiple sclerosis ([place]) 02/24/2016  . Tobacco abuse 12/11/2015  . Aortic atherosclerosis ([place]) 12/11/2015  . 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 Falls frequently 12/11/2015  . 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 Other emphysema ([place]) 12/11/2015    Past Surgical History:  Procedure  Laterality Date  . EYE SURGERY Right   . ORIF FACIAL FRACTURE    . TUBAL LIGATION    . tubaligation       OB History    Gravida  3   Para  3   Term  3   Preterm      AB      Living        SAB      TAB      Ectopic      Multiple      Live Births               Home Medications    Prior to Admission medications   Medication Sig Start Date End Date Taking? Authorizing Provider  buPROPion (WELLBUTRIN SR) 150 MG 12 hr tablet TAKE (1) TABLET BY MOUTH TWICE DAILY. 12/14/16   Raylene Everts, MD  cholecalciferol (VITAMIN D) 1000 units tablet Take 2,000 Units by mouth daily.    [provider]  gabapentin (NEURONTIN) 300 MG capsule Take 1 capsule (300 mg total) by mouth 3 (three) times daily. start with one at bedtime.  Increase as tolerated to TID 03/09/16   Raylene Everts, MD  IBU 800 MG tablet TAKE (1) TABLET BY MOUTH EVERY EIGHT HOURS AS NEEDED. 05/13/17   Raylene Everts, MD  propranolol (INDERAL) 20 MG tablet Take  20 mg by mouth 2 (two) times daily.    [provider]  traMADol (ULTRAM) 50 MG tablet Take 50 mg by mouth every 6 (six) hours as needed.    [provider]    Family History Family History  Problem Relation Age of Onset  . Heart attack Mother   . Kidney failure Mother   . Diabetes Mother   . Heart disease Mother   . Hyperlipidemia Mother   . Hypertension Mother   . Heart attack Father 17  . Hypertension Father   . Hypertension Sister   . Diabetes Sister   . Other Brother        house fire  . Hypertension Brother   . Seizures Brother   . Heart disease Brother        mitral valve  . Arthritis Daughter        DJD, AS fibromyalgia  . Polymyositis Daughter   . Cancer Maternal Grandfather        lung  . Heart disease Maternal Grandfather   . Other Paternal Grandmother        house fire  . Alcohol abuse Son     Social History Social History   Tobacco Use  . Smoking status: Current Every Day Smoker     Packs/day: 0.25    Years: 45.00    Pack years: 11.25  . Smokeless tobacco: Never Used  . Tobacco comment: cutting down  Substance Use Topics  . Alcohol use: Yes    Alcohol/week: 2.0 standard drinks    Types: 2 Cans of beer per week  . Drug use: No     Allergies   Darvon [propoxyphene] and Penicillins   Review of Systems Review of Systems  Constitutional: Positive for fever (subjective, temp always under 100). Negative for chills.  HENT: Negative for facial swelling and sore throat.   Respiratory: Negative for shortness of breath.   Cardiovascular: Negative for chest pain.  Gastrointestinal: Negative for abdominal pain, nausea and vomiting.  Genitourinary: Negative for dysuria.  Musculoskeletal: Negative for back pain.  Skin: Negative for rash and wound.  Neurological: Positive for weakness and numbness. Negative for headaches.  Psychiatric/Behavioral: The patient is not nervous/anxious.      Physical Exam Updated Vital Signs BP (!) 165/104   Pulse 67   Temp 97.9 F (36.6 C) (Oral)   Resp 18   Ht 5\' 6"  (1.676 m)   Wt 45.4 kg   SpO2 100%   BMI 16.14 kg/m   Physical Exam Vitals signs and nursing note reviewed.  Constitutional:      General: She is not in acute distress.    Appearance: She is well-developed. She is not diaphoretic.  HENT:     Head: Normocephalic and atraumatic.     Mouth/Throat:     Pharynx: No oropharyngeal exudate.  Eyes:     General: No scleral icterus.       Right eye: No discharge.        Left eye: No discharge.     Conjunctiva/sclera: Conjunctivae normal.     Pupils: Pupils are equal, round, and reactive to light.  Neck:     Musculoskeletal: Normal range of motion and neck supple.     Thyroid: No thyromegaly.  Cardiovascular:     Rate and Rhythm: Normal rate and regular rhythm.     Heart sounds: Normal heart sounds. No murmur. No friction rub. No gallop.   Pulmonary:     Effort: Pulmonary effort is  normal. No respiratory distress.      Breath sounds: Normal breath sounds. No stridor. No wheezing or rales.  Abdominal:     General: Bowel sounds are normal. There is no distension.     Palpations: Abdomen is soft.     Tenderness: There is no abdominal tenderness. There is no guarding or rebound.  Musculoskeletal:     Comments: Decrease sensation in the hands and feet, decreased bilateral grip strength, 4/5 strength to bilateral lower extremities, 5/5 strength to bilateral upper extremities Patient tried to take her nightly gabapentin and dropped her pills several times trying to take them  Lymphadenopathy:     Cervical: No cervical adenopathy.  Skin:    General: Skin is warm and dry.     Coloration: Skin is not pale.     Findings: No rash.  Neurological:     Mental Status: She is alert.     Coordination: Coordination normal.      ED Treatments / Results  Labs (all labs ordered are listed, but only abnormal results are displayed) Labs Reviewed  CBC WITH DIFFERENTIAL/PLATELET - Abnormal; Notable for the following components:      Result Value   Hemoglobin 16.4 (*)    HCT 47.7 (*)    MCV 100.4 (*)    MCH 34.5 (*)    All other components within normal limits  BASIC METABOLIC PANEL - Abnormal; Notable for the following components:   Glucose, Bld 110 (*)    All other components within normal limits  SARS CORONAVIRUS 2 (TAT 6-24 HRS)  URINALYSIS, ROUTINE W REFLEX MICROSCOPIC    EKG None  Radiology No results found.  Procedures Procedures (including critical care time)  Medications Ordered in ED Medications  methylPREDNISolone sodium succinate (SOLU-MEDROL) 1,000 mg in sodium chloride 0.9 % 50 mL IVPB (has no administration in time range)     Initial Impression / Assessment and Plan / ED Course  I have reviewed the triage vital signs and the nursing notes.  Pertinent labs & imaging results that were available during my care of the patient were reviewed by me and considered in my medical decision  making (see chart for details).        Patient presenting with numbness and weakness to hands and feet bilaterally.  Teleneurologist, Dr. Reesa Chew evaluated the patient to feels that patient is having a multiple sclerosis relapse.  He recommends MRI of the brain and cervical spine with and without contrast and IV Solu-Medrol.  Labs are unremarkable.  I discussed patient case with Dr. Velia Meyer with [facility] who accepts patient for admission.  I appreciate the above consultants' assistance with the patient.  Final Clinical Impressions(s) / ED Diagnoses   Final diagnoses:  Multiple sclerosis exacerbation [facility])    ED Discharge Orders    None       Frederica Kuster, Hershal Coria 01/17/19 2056    Ezequiel Essex, MD 01/18/19 [phone]

## 2019-01-17 NOTE — ED Triage Notes (Signed)
Patient reports numbness to both hands and feet, onset of symptoms 1 month ago.

## 2019-01-17 NOTE — Consult Note (Signed)
[facility] Tele[facility]urology Consult Services  Stat Consult  Date of Service:   01/17/2019 19:33:58  Impression:     .  Multiple sclerosis relapse  Comments/Sign-Out: I would recommend admitting her to the hospital, getting an MRI of the head and cervical spine with and without contrast. I would suggest starting her on IV Solu-Medrol 1000 mg daily IV for 3 to 5 days. She should see a physical therapist, occupational therapist and neurology consultation. She should be on Lovenox for DVT prevention. Depending on the results of the MRI, if needed further work-up should be done.  Metrics: [facility] Notification Time: 01/17/2019 19:32:21 Stamp Time: 01/17/2019 19:33:58 Callback Response Time: 01/17/2019 19:35:40 Video Start Time: 01/17/2019 19:45:26 Video End Time: 01/17/2019 19:58:12  Our recommendations are outlined below.  Imaging Studies:     .  MRI Head with and Without Contrast  Other WorkUp:     .  Infectious/metabolic workup per primary team     .  Check B12 level     .  Check TSH  Disposition: Neurology Follow Up Recommended  Sign Out:     .  Discussed with Emergency Department Provider  ----------------------------------------------------------------------------------------------------  Chief Complaint: Multiple sclerosis exacerbation  History of Present Illness: Patient is a 63 year old Female.  Extremely pleasant 63 year old female diagnosed with MS 3 years ago came to the hospital because of numbness in the arms and legs. She reports symptoms have been going on for the past month and have progressively worsened. She reports difficulty walking. She reports normally uses a walker at home. She reports she was on Tysabri for couple of years and then was switched to [place]. She reports she only got 1 dose stage of Ocrevus and could not tolerate the side effects and is currently not taking any disease modifying treatment for MS. She denies any  problem with her speech or swallowing. Denies any bladder issues or bowel issues.    Anticoagulant use:  No  Antiplatelet use: No Examination: BP(163/88), Pulse(94), Blood Glucose(110)  Neuro Exam:  General: Alert,Awake, Oriented to Time, Place, Person  Speech: Dysarthric:  Language: Intact:  Face: Symmetric:  Facial Sensation: Intact:  Visual Fields: Intact:  Extraocular Movements: Intact:  Motor Exam: Drift: RLE, LLE  Sensation: Intact:  Coordination: Intact:  Patient has bilateral lower extremity weakness of grade 3 x 5, right worse than left. She has decreased pinprick in a stocking and glove distribution.  Patient/Family was informed the Neurology Consult would happen via TeleHealth consult by way of interactive audio and video telecommunications and consented to receiving care in this manner.  Due to the immediate potential for life-threatening deterioration due to underlying acute neurologic illness, I spent 20 minutes providing critical care. This time includes time for face to face visit via telemedicine, review of medical records, imaging studies and discussion of findings with providers, the patient and/or family.   Dr Faustino Congress   [facility] [phone]  Case [id number]

## 2019-01-17 NOTE — H&P (Signed)
History and Physical    PLEASE NOTE THAT DRAGON DICTATION SOFTWARE WAS USED IN THE CONSTRUCTION OF THIS NOTE.   Faith [name] [id number] DOB: 1955/06/05 DOA: 01/17/2019  PCP: Doree Albee, MD Patient coming from: Home  I have personally briefly reviewed patient's old medical records in [place]  Chief Complaint: Progressive weakness and diminished sensation in the hands and feet.   HPI: Faith [name] is a 63 y.o. female with medical history significant for multiple sclerosis, chronic tobacco abuse, chronic back pain, who is admitted to [facility] on 01/17/2019 with multiple sclerosis exacerbation after presenting from home to [facility] emergency department complaining of progressive weakness and diminished sensation involving the hands and feet.   The patient reports that she was diagnosed with multiple sclerosis approximately 3 years ago.  She was subsequently on Tysabri for approximately 2 years, before being switched to Ocrelizumab approximately 2 months ago.  However, she reportedly developed multiple side effects to Ocrelizumab, including severe headache, which resulted in discontinuation of this medication after just 1 infusion.  Subsequently, she is on no chronic medications as management of her multiple sclerosis.   Over the course of the last month, the patient reports progressive weakness and numbness in the in the hands and feet in a symmetrical distribution.  As result, she notes diminished grip strength, and has begun to frequently drop items such as dining utensils over that timeframe.  The weakness and diminished sensation has also resulted in increasing difficulty with ambulation, including that with use of her walker at home.  As result, the patient reports intermittent ground-level mechanical falls, but denies ever hitting her head as a component of these falls and denies any associated loss of consciousness. Denies any associated recent change in  vision and denies any associated dysphagia.  Denies any associated headache, vertigo, or dysarthria. She also denies any associated urinary incontinence or retention.  She also denies any recent dysuria, gross hematuria, or or change in urinary urgency/frequency.  She knowledges a history of chronic back pain, in the absence of any acute exacerbation of this.  In the context of the progressive nature of her weakness/numbness in the hands and feet bilaterally, particularly in the setting of associated progressive difficulty with ambulation, the patient presents to [facility] emergency department on 01/17/2019 for further evaluation.   She denies any recent subjective fever, chills, rigors, or generalized myalgias.  She denies any recent travel or known COVID-19 exposures.  Denies any sore throat, cough, shortness of breath, chest pain, nausea, vomiting, diarrhea, or rash.    ED Course: Vital signs in [facility] ED were notable for the following: Temperature max 97.9; heart rate 64-69; blood pressure ranged from 146/91-166/85; respiratory rate 15-20, and oxygen saturation 97 to 100% on room air.  Labs in the ED were notable for the following: BMP notable for creatinine 0.79, glucose 110; CBC notable for white blood cell count of 8300.  In anticipation of admission, routine screening COVID-19 nasopharyngeal swab was obtained, with result currently pending.  In [facility] ED, the patient was evaluated by Dr. Faustino Congress, teleneurologist, who felt that the presentation was consistent with exacerbation of underlying multiple sclerosis.  He recommended Solu-Medrol 1000 mg IV Qdaily for 3-5 days. He also recommended transfer to [facility] for MRI of the brain and cervical spine with and without contrast as well as inpatient neurology consult in the morning. Dr. Mike Craze also recommended checking B12 and TSH levels,  in addition to inpatient PT/OT consultations.   While still in [facility] ED, the patient  received Solu-Medrol 1000 mg IV x1 at approximately 2200 on 01/17/19 before being admitted to [facility] for further evaluation and management of suspected presenting multiple sclerosis exacerbation.    Review of Systems: As per HPI otherwise 10 point review of systems negative.   Past Medical History:  Diagnosis Date   Anemia    my whole life, Used to take iron   Arthritis    spine, lumbar   COPD (chronic obstructive pulmonary disease) (HCC)    Emphysema of lung ([place])    Narcolepsy    by history, has nightmares   Neuromuscular disorder ([place])    leg issues from spine    Past Surgical History:  Procedure Laterality Date   EYE SURGERY Right    ORIF FACIAL FRACTURE     TUBAL LIGATION     tubaligation      Social History:  reports that she has been smoking. She has a 11.25 pack-year smoking history. She has never used smokeless tobacco. She reports current alcohol use of about 2.0 standard drinks of alcohol per week. She reports that she does not use drugs.   Allergies  Allergen Reactions   Darvon [Propoxyphene] Other (See Comments)    Hallicuations   Penicillins Rash    Family History  Problem Relation Age of Onset   Heart attack Mother    Kidney failure Mother    Diabetes Mother    Heart disease Mother    Hyperlipidemia Mother    Hypertension Mother    Heart attack Father 53   Hypertension Father    Hypertension Sister    Diabetes Sister    Other Brother        house fire   Hypertension Brother    Seizures Brother    Heart disease Brother        mitral valve   Arthritis Daughter        DJD, AS fibromyalgia   Polymyositis Daughter    Cancer Maternal Grandfather        lung   Heart disease Maternal Grandfather    Other Paternal Grandmother        house fire   Alcohol abuse Son     Prior to Admission medications   Medication Sig Start Date End Date Taking? Authorizing Provider  buPROPion (WELLBUTRIN SR)  150 MG 12 hr tablet TAKE (1) TABLET BY MOUTH TWICE DAILY. 12/14/16   Raylene Everts, MD  cholecalciferol (VITAMIN D) 1000 units tablet Take 2,000 Units by mouth daily.    [provider]  gabapentin (NEURONTIN) 300 MG capsule Take 1 capsule (300 mg total) by mouth 3 (three) times daily. start with one at bedtime.  Increase as tolerated to TID 03/09/16   Raylene Everts, MD  IBU 800 MG tablet TAKE (1) TABLET BY MOUTH EVERY EIGHT HOURS AS NEEDED. 05/13/17   Raylene Everts, MD  propranolol (INDERAL) 20 MG tablet Take 20 mg by mouth 2 (two) times daily.    [provider]  traMADol (ULTRAM) 50 MG tablet Take 50 mg by mouth every 6 (six) hours as needed.    [provider]    Objective    Physical Exam: Vitals:   01/17/19 1602 01/17/19 1603 01/17/19 1900 01/17/19 1930  BP: (!) 163/101  (!) 177/98 (!) 165/104  Pulse: 67     Resp: 18     Temp: 97.9 F (  36.6 C)     TempSrc: Oral     SpO2: 100%     Weight:  45.4 kg    Height:  5\' 6"  (1.676 m)      General: appears to be stated age; alert, oriented Skin: warm, dry, no rash Head:  AT/ Eyes:  PEARL b/l, EOMI Mouth:  Oral mucosa membranes appear moist, normal dentition Neck: supple; trachea midline Heart:  RRR; did not appreciate any M/R/G Lungs: CTAB, did not appreciate any wheezes, rales, or rhonchi Abdomen: + BS; soft, ND, NT Extremities: trace edema in b/l LE's; no calf tenderness, b/l Neuro: 4/5 strength in the upper extremities bilaterally; diminished grip strength noted bilaterally; 3 out of 5 strength in the lower extremities bilaterally; diminished sensation to light touch noted in symmetrical distribution associated with the bilateral hands and feet; CN's 2-12 grossly intact; no evidence suggestive of slurred speech, dysarthria, or facial droop.   Labs on Admission: I have personally reviewed following labs and imaging studies  CBC: Recent Labs  Lab 01/17/19 1717  WBC 8.3  NEUTROABS 5.3    HGB 16.4*  HCT 47.7*  MCV 100.4*  PLT 0000000   Basic Metabolic Panel: Recent Labs  Lab 01/17/19 1717  NA 137  K 4.1  CL 103  CO2 25  GLUCOSE 110*  BUN 13  CREATININE 0.79  CALCIUM 8.9   GFR: Estimated Creatinine Clearance: 52.3 mL/min (by C-G formula based on SCr of 0.79 mg/dL). Liver Function Tests: No results for input(s): AST, ALT, ALKPHOS, BILITOT, PROT, ALBUMIN in the last 168 hours. No results for input(s): LIPASE, AMYLASE in the last 168 hours. No results for input(s): AMMONIA in the last 168 hours. Coagulation Profile: No results for input(s): INR, PROTIME in the last 168 hours. Cardiac Enzymes: No results for input(s): CKTOTAL, CKMB, CKMBINDEX, TROPONINI in the last 168 hours. BNP (last 3 results) No results for input(s): PROBNP in the last 8760 hours. HbA1C: No results for input(s): HGBA1C in the last 72 hours. CBG: No results for input(s): GLUCAP in the last 168 hours. Lipid Profile: No results for input(s): CHOL, HDL, LDLCALC, TRIG, CHOLHDL, LDLDIRECT in the last 72 hours. Thyroid Function Tests: No results for input(s): TSH, T4TOTAL, FREET4, T3FREE, THYROIDAB in the last 72 hours. Anemia Panel: No results for input(s): VITAMINB12, FOLATE, FERRITIN, TIBC, IRON, RETICCTPCT in the last 72 hours. Urine analysis:    Component Value Date/Time   COLORURINE STRAW (A) 09/13/2018 1420   APPEARANCEUR CLEAR 09/13/2018 1420   LABSPEC 1.004 (L) 09/13/2018 1420   PHURINE 5.0 09/13/2018 1420   GLUCOSEU NEGATIVE 09/13/2018 1420   HGBUR NEGATIVE 09/13/2018 1420   BILIRUBINUR NEGATIVE 09/13/2018 1420   KETONESUR NEGATIVE 09/13/2018 1420   PROTEINUR NEGATIVE 09/13/2018 1420   NITRITE NEGATIVE 09/13/2018 1420   LEUKOCYTESUR NEGATIVE 09/13/2018 1420     Assessment/Plan   Faith [name] is a 63 y.o. female with medical history significant for multiple sclerosis, chronic tobacco abuse, chronic back pain, who is admitted to [facility] on 01/17/2019 with  multiple sclerosis exacerbation after presenting from home to [facility] emergency department complaining of progressive weakness and diminished sensation involving the hands and feet.    Principal Problem:   Multiple sclerosis exacerbation ([place]) Active Problems:   Tobacco abuse   Falls frequently   Multiple sclerosis (HCC)   Chronic back pain  #) Multiple sclerosis exacerbation: In the setting of a history of multiple sclerosis diagnosed 3 years ago, with no disease modifying medications over  the last 2 months after development of side-effects to Ocrelizumab infusion, the patient presents with 1 month of progressive symmetrical weakness and numbness in the hands and feet, bilaterally.  This is resulted in increasing difficulty with ambulation as well as intermittent ground-level mechanical falls in the absence of any associated loss of consciousness or head trauma. In [facility] ED today, patient was evaluated by Dr. Faustino Congress, teleneurologist, who felt that the presentation was consistent with exacerbation of underlying multiple sclerosis. Consequently, he recommended Solu-Medrol 1000 mg IV Qdaily for 3-5 days, with first dose of Solu-Medrol administered at approximately 2200 on 01/17/2019. He also recommended transfer to [facility] for MRI of the brain and cervical spine with and without contrast as well as inpatient neurology consult in the morning. Dr. Mike Craze also recommended checking B12 and TSH levels, in addition to inpatient PT/OT consults.  Patient's complaints and objective findings on physical exam are all symmetrical in nature, with no evidence to suggest acute ischemic CVA.   Plan: Per teleneurologist recommendations, Solu-Medrol 1000 mg IV daily x4 additional days.  MRI of the brain and cervical spine with and without contrast, as above.  Check B12 and TSH levels.  PT OT consults have been ordered for the morning of 01/18/2019. Admission to hospitalist service at [facility], with plan for  neurology consult in the morning.     #) Frequent ground-level mechanical falls: Appear to be as a consequence of aforementioned progressive weakness/numbness in bilateral hands/feet, suspect to be on the basis of exacerbation of underlying multiple sclerosis.  Patient denies any associated head trauma and also denies any associated loss of consciousness.   Plan: Work-up and management of presenting multiple sclerosis exacerbation, as above.  PT/OT consult in the morning.  Fall precautions.     #) Chronic tobacco abuse: The patient reports that she is a current smoker, having smoked approximately 1/4 pack/day over the last 40 to 50 years.  However, she notes that she has recently been able to decrease this frequency to a rate of 2-3 cigarettes per day, with goal of complete smoking discontinuation.   Plan: Counseled the patient on the importance of complete smoking discontinuation.  As needed nicotine patch has been ordered for use during this hospitalization.     #) Chronic back pain: Without any recent worsening thereof.  On as needed tramadol as an outpatient.  Plan: Continue home as needed tramadol.    DVT prophylaxis: Lovenox 40 mg [place] qdaily.  Code Status: Full Family Communication: None Disposition Plan:  Per Rounding Team Consults called: Dr. Faustino Congress (teleneurologist), as above.  Admission status: inpatient; med-tele at [facility]   PLEASE NOTE THAT DRAGON DICTATION SOFTWARE WAS USED IN THE CONSTRUCTION OF THIS NOTE.   [facility] Pager [phone] From 3PM- 11PM.   Otherwise, please contact night-coverage  [email or web address] Password [facility]  01/17/2019, 8:33 PM

## 2019-01-18 ENCOUNTER — Inpatient Hospital Stay (HOSPITAL_COMMUNITY): Payer: Medicaid Other

## 2019-01-18 ENCOUNTER — Encounter (HOSPITAL_COMMUNITY): Payer: Self-pay

## 2019-01-18 ENCOUNTER — Other Ambulatory Visit: Payer: Self-pay

## 2019-01-18 ENCOUNTER — Ambulatory Visit (INDEPENDENT_AMBULATORY_CARE_PROVIDER_SITE_OTHER): Payer: Medicaid Other | Admitting: Internal Medicine

## 2019-01-18 DIAGNOSIS — G8929 Other chronic pain: Secondary | ICD-10-CM

## 2019-01-18 DIAGNOSIS — G35 Multiple sclerosis: Secondary | ICD-10-CM

## 2019-01-18 LAB — URINALYSIS, ROUTINE W REFLEX MICROSCOPIC
Bilirubin Urine: NEGATIVE
Glucose, UA: NEGATIVE mg/dL
Hgb urine dipstick: NEGATIVE
Ketones, ur: NEGATIVE mg/dL
Leukocytes,Ua: NEGATIVE
Nitrite: NEGATIVE
Protein, ur: NEGATIVE mg/dL
Specific Gravity, Urine: 1.011 (ref 1.005–1.030)
pH: 6 (ref 5.0–8.0)

## 2019-01-18 LAB — SARS CORONAVIRUS 2 (TAT 6-24 HRS): SARS Coronavirus 2: NEGATIVE

## 2019-01-18 MED ORDER — INFLUENZA VAC SPLIT QUAD 0.5 ML IM SUSY
0.5000 mL | PREFILLED_SYRINGE | INTRAMUSCULAR | Status: AC
  Administered 2019-01-19: 0.5 mL via INTRAMUSCULAR
  Filled 2019-01-18: qty 0.5

## 2019-01-18 MED ORDER — GADOBUTROL 1 MMOL/ML IV SOLN
4.0000 mL | Freq: Once | INTRAVENOUS | Status: AC | PRN
  Administered 2019-01-18: 4 mL via INTRAVENOUS

## 2019-01-18 NOTE — Progress Notes (Signed)
PT Progress Note for Charges    01/18/19 1300  PT Visit Information  Last PT Received On 01/18/19  PT General Charges  $$ ACUTE PT VISIT 1 Visit  PT Evaluation  $PT Eval Moderate Complexity 1 Mod  PT Treatments  $Gait Training 8-22 mins  Anastasio Champion, DPT  Acute Rehabilitation Services Pager [phone] Office [phone]

## 2019-01-18 NOTE — Evaluation (Signed)
Physical Therapy Evaluation Patient Details Name: Faith [name] MRN: [id number] DOB: [birth year] Today's Date: 01/18/2019   History of Present Illness  Pt is a 63 y.o. F admitted for an exacerbation of multiple sclerosis. She has a PMH including but not limited to COPD, back pain, and tobacco use.  Clinical Impression  Pt admitted for above diagnosis. Upon arrival, pt is pleasant and agreeable to PT evaluation. She required min guard assist for safety but increased time and effort for bed mobility. She ambulated 2 ft with RW and mod A before fatiguing. Her gait was ataxic and uncoordinated, requiring mod A for stability. She became dizzy and lightheaded after a few steps and needed to sit in chair. She tolerated LE exercises well and demonstrates less coordination of movement in the R LE compared to the L. She presents with generalized weakness, lack of coordination, and balance and gait deficits. At discharge, recommend CIR at this time to address deficits and improve independence prior to returning home. Pt would benefit from continued skilled acute care PT intervention in order to improve functional mobility.     Follow Up Recommendations CIR    Equipment Recommendations  None recommended by PT(TBD at next venue of care)    Recommendations for Other Services OT consult;Rehab consult     Precautions / Restrictions Precautions Precautions: None Restrictions Weight Bearing Restrictions: No      Mobility  Bed Mobility Overal bed mobility: Needs Assistance Bed Mobility: Supine to Sit     Supine to sit: Min guard     General bed mobility comments: did not require physical assistance but sat EOB from supine with increased time and effort and demonstrated postural/trunk weakness throughout  Transfers Overall transfer level: Needs assistance Equipment used: Rolling walker (2 wheeled) Transfers: Sit to/from [facility] Sit to Stand: Mod assist Stand pivot transfers:  Mod assist;+2 safety/equipment       General transfer comment: initially tried to ambulate but pt has significant weakness in LEs and UEs so required +2 for chair follow. She required mod assist during transfer and plopped down in the chair as she was being given verbal cueing to reach back.  Ambulation/Gait Ambulation/Gait assistance: Mod assist;+2 safety/equipment Gait Distance (Feet): 2 Feet Assistive device: Rolling walker (2 wheeled) Gait Pattern/deviations: Step-to pattern;Decreased step length - right;Decreased step length - left;Shuffle Gait velocity: decr   General Gait Details: uncoordinated gait, demonstrated LE weakness and fatigued after 2 ft, had one episode of LOB  Stairs            Wheelchair Mobility    Modified Rankin (Stroke Patients Only)       Balance Overall balance assessment: Needs assistance Sitting-balance support: Bilateral upper extremity supported;Feet unsupported;Single extremity supported Sitting balance-Leahy Scale: Poor Sitting balance - Comments: Pt demonstrates lack of trunk control and required support of at least one UE on bed/rail at EOB Postural control: Left lateral lean(anterior lean) Standing balance support: Bilateral upper extremity supported;During functional activity Standing balance-Leahy Scale: Poor Standing balance comment: required support of B UE on RW                             Pertinent Vitals/Pain Pain Assessment: No/denies pain    Home Living Family/patient expects to be discharged to:: Private residence Living Arrangements: Children Available Help at Discharge: Family Type of Home: House Home Access: Stairs to enter Entrance Stairs-Rails: Left Entrance Stairs-Number of Steps: 4 Home Layout: One level  Home Equipment: [place] - 4 wheels;Shower seat;Hospital bed      Prior Function Level of Independence: Needs assistance   Gait / Transfers Assistance Needed: Able to transfer/ambulate  independently but with increased time and effort  ADL's / Homemaking Assistance Needed: Needs assistance- son helps her reach cabinets, do dishes, etc.        Hand Dominance   Dominant Hand: Right    Extremity/Trunk Assessment        Lower Extremity Assessment Lower Extremity Assessment: Generalized weakness(during functional activity)    Cervical / Trunk Assessment Cervical / Trunk Assessment: Kyphotic(thoracic kyphosis)  Communication   Communication: No difficulties  Cognition Arousal/Alertness: Awake/alert Behavior During Therapy: WFL for tasks assessed/performed;Anxious Overall Cognitive Status: Within Functional Limits for tasks assessed                                        General Comments      Exercises General Exercises - Lower Extremity Long Arc Quad: AROM;Both;10 reps;Seated Hip Flexion/Marching: AROM;Strengthening;Both;10 reps;Seated   Assessment/Plan    PT Assessment Patient needs continued PT services  PT Problem List Decreased strength;Decreased range of motion;Decreased activity tolerance;Decreased balance;Decreased mobility;Decreased coordination;Decreased knowledge of use of DME;Decreased safety awareness       PT Treatment Interventions DME instruction;Gait training;Stair training;Functional mobility training;Therapeutic activities;Therapeutic exercise;Balance training;Neuromuscular re-education;Patient/family education;Modalities    PT Goals (Current goals can be found in the Care Plan section)  Acute Rehab PT Goals Patient Stated Goal: to get better and stronger PT Goal Formulation: With patient Time For Goal Achievement: 02/01/19 Potential to Achieve Goals: Fair    Frequency Min 4X/week   Barriers to discharge        Co-evaluation               AM-PAC PT "6 Clicks" Mobility  Outcome Measure Help needed turning from your back to your side while in a flat bed without using bedrails?: A Lot Help needed moving  from lying on your back to sitting on the side of a flat bed without using bedrails?: A Lot Help needed moving to and from a bed to a chair (including a wheelchair)?: A Lot Help needed standing up from a chair using your arms (e.g., wheelchair or bedside chair)?: A Little Help needed to walk in hospital room?: A Lot Help needed climbing 3-5 steps with a railing? : Total 6 Click Score: 12    End of Session Equipment Utilized During Treatment: Gait belt Activity Tolerance: Patient limited by fatigue Patient left: in chair;with call bell/phone within reach Nurse Communication: Mobility status PT Visit Diagnosis: Unsteadiness on feet (R26.81);Other abnormalities of gait and mobility (R26.89);Muscle weakness (generalized) (M62.81);Other symptoms and signs involving the nervous system (R29.898)    Time: [id number] PT Time Calculation (min) (ACUTE ONLY): 35 min   Charges:   PT Evaluation $PT Eval Moderate Complexity: 1 Mod PT Treatments $Gait Training: 8-22 mins        Silvana Newness, SPT   Edwin Baines 01/18/2019, 1:34 PM

## 2019-01-18 NOTE — Progress Notes (Signed)
Rehab Admissions Coordinator Note:  Per PT recommendation, this patient was screened by Raechel Ache for appropriateness for an Inpatient Acute Rehab Consult.  At this time, we are recommending Inpatient Rehab consult.   I will place a consult order in the chart. An AC will follow up for further assessment and determine appropriateness for a possible IP Rehab admission.   Raechel Ache 01/18/2019, 1:38 PM  I can be reached at [phone].

## 2019-01-18 NOTE — Consult Note (Signed)
NEURO HOSPITALIST CONSULT NOTE   Requesting physician: Dr. Roger Shelter  Reason for Consult: MS exacerbation  History obtained from:  Patient and Chart     HPI:                                                                                                                                          Faith [name] is an 63 y.o. female patient of Dr. Merlene Laughter, diagnosed with primary progressive MS 3 years ago, previously treated with Tysabri, then Ocrevus, now off of any disease-modifying therapy, who presented to the [facility] ED yesterday with a one month history of progressively worsened numbness to hands and feet with difficulty walking. Of note, she has had progressive worsening of her ambulation since PPMS diagnosis and uses a walker at baseline. Denied speech difficulty or dysphagia. Also denied problems with bowel or bladder. She had decreased grip strength, decreased sensation throughout and difficulty lifting legs off bed on initial EDP examination. A Teleneurology consult was obtained and the patient was diagnosed with an MS exacerbation. Transfer to [facility] and 5 days of IV Solumedrol was recommended, in addition to an MRI of the brain and cervical spine with and without contrast.    She was taken off Tysabri due to a positive JC virus test. She stopped Ocrevus after 1 treatment due to severe side effects including headache.   Her PMHx includes tobacco use, chronic back pain, frequent falls and COPD.   She states that she has become more anxious and "edgy" gradually over time since being diagnosed with PPMS.   Past Medical History:  Diagnosis Date  . Anemia    my whole life, Used to take iron  . Arthritis    spine, lumbar  . COPD (chronic obstructive pulmonary disease) ([place])   . Emphysema of lung ([place])   . Narcolepsy    by history, has nightmares  . Neuromuscular disorder ([place])    leg issues from spine    Past Surgical History:  Procedure Laterality Date  . EYE SURGERY  Right   . ORIF FACIAL FRACTURE    . TUBAL LIGATION    . tubaligation      Family History  Problem Relation Age of Onset  . Heart attack Mother   . Kidney failure Mother   . Diabetes Mother   . Heart disease Mother   . Hyperlipidemia Mother   . Hypertension Mother   . Heart attack Father 43  . Hypertension Father   . Hypertension Sister   . Diabetes Sister   . Other Brother        house fire  . Hypertension Brother   . Seizures Brother   . Heart disease Brother        mitral valve  . Arthritis Daughter  DJD, AS fibromyalgia  . Polymyositis Daughter   . Cancer Maternal Grandfather        lung  . Heart disease Maternal Grandfather   . Other Paternal Grandmother        house fire  . Alcohol abuse Son               Social History:  reports that she has been smoking. She has a 11.25 pack-year smoking history. She has never used smokeless tobacco. She reports current alcohol use of about 2.0 standard drinks of alcohol per week. She reports that she does not use drugs.  Allergies  Allergen Reactions  . Darvon [Propoxyphene] Other (See Comments)    Hallicuations  . Penicillins Rash    Did it involve swelling of the face/tongue/throat, SOB, or low BP? Unknown Did it involve sudden or severe rash/hives, skin peeling, or any reaction on the inside of your mouth or nose? Unknown Did you need to seek medical attention at a hospital or doctor's office? Unknown When did it last happen?Unknown If all above answers are "NO", may proceed with cephalosporin use.     MEDICATIONS:                                                                                                                     Prior to Admission:  Medications Prior to Admission  Medication Sig Dispense Refill Last Dose  . Cholecalciferol (VITAMIN D) 50 MCG (2000 UT) CAPS Take 2,000 Units by mouth 2 (two) times daily.    01/17/2019 at Unknown time  . gabapentin (NEURONTIN) 300 MG capsule Take 1 capsule  (300 mg total) by mouth 3 (three) times daily. start with one at bedtime.  Increase as tolerated to TID (Patient taking differently: Take 600 mg by mouth 3 (three) times daily. ) 180 capsule 3 01/17/2019 at Unknown time  . IBU 800 MG tablet TAKE (1) TABLET BY MOUTH EVERY EIGHT HOURS AS NEEDED. (Patient taking differently: Take 800 mg by mouth every 8 (eight) hours as needed for mild pain or moderate pain. ) 90 tablet 1 unknown  . propranolol (INDERAL) 60 MG tablet Take 60 mg by mouth 2 (two) times daily.    01/17/2019 at 14-1600  . traMADol (ULTRAM) 50 MG tablet Take 100 mg by mouth every 6 (six) hours as needed for moderate pain.    01/17/2019 at 2210   Scheduled: . cholecalciferol  2,000 Units Oral BID  . enoxaparin (LOVENOX) injection  30 mg Subcutaneous Q24H  . gabapentin  600 mg Oral TID  . [START ON 01/19/2019] influenza vac split quadrivalent PF  0.5 mL Intramuscular Tomorrow-1000  . propranolol  60 mg Oral BID  . sodium chloride flush  3 mL Intravenous Q12H   Continuous: . methylPREDNISolone (SOLU-MEDROL) injection       ROS:  As per HPI. Does not endorse additional symptoms.    Blood pressure (!) 146/89, pulse 71, temperature 97.7 F (36.5 C), temperature source Oral, resp. rate 17, height 5\' 6"  (1.676 m), weight 45.4 kg, SpO2 94 %.   General Examination:                                                                                                       Physical Exam  HEENT-  [place]/AT   Lungs- Respirations unlabored Extremities- No edema  Neurological Examination Mental Status: Awake and alert. Anxious affect. Mildly agitated. Fully oriented. Speech is fluent with intact comprehension. Cranial Nerves: II: PERRL. Visual fields intact with no extinction to DSS.  III,IV, VI: EOMI with leftward nystagmus on left gaze.  V,VII: No facial droop.  Decreased temp sensation to right face.  VIII: hearing intact to voice IX,X: No hypophonia XI: Symmetric XII: midline tongue extension Motor: Decreased bulk x 4, proximally and distally.  Increased tone x 4.  LUE: 4+/5 throughout except for 4/5 finger abduction, 4/5 grip RUE: 4+/5 throughout except for 4-/5 finger abduction, 4/5 grip BLE: Symmetric findings as follows: 2/5 hip flexion, 4-/5 knee extension, unable to flex at knee more than 20 degrees with 3/5 strength in this range, ADF 4/5.  Sensory: Decreased temp sensation RUE. FT intact x 4. No extinction.  Deep Tendon Reflexes: 3+ biceps, brachioradialis, patellae and achilles bilaterally (brisk, low amplitude). Plantars: Tonically upgoing bilaterally.  Cerebellar: Ataxic BUE with FNF. Unable to perform H-S.  Gait: Deferred due to falls risk concerns   Lab Results: Basic Metabolic Panel: Recent Labs  Lab 01/17/19 1717  NA 137  K 4.1  CL 103  CO2 25  GLUCOSE 110*  BUN 13  CREATININE 0.79  CALCIUM 8.9    CBC: Recent Labs  Lab 01/17/19 1717  WBC 8.3  NEUTROABS 5.3  HGB 16.4*  HCT 47.7*  MCV 100.4*  PLT 333    Cardiac Enzymes: No results for input(s): CKTOTAL, CKMB, CKMBINDEX, TROPONINI in the last 168 hours.  Lipid Panel: No results for input(s): CHOL, TRIG, HDL, CHOLHDL, VLDL, LDLCALC in the last 168 hours.  Imaging: No results found.   Assessment: 63 year old female with presenting symptoms most consistent with MS exacerbation. Has a diagnosis of PPMS.  1. Exam reveals weakness in all 4 extremities, worse in BLE, increased tone and hyperreflexia, as well as appendicular ataxia.  2. Has been off of disease modifying therapy.  3. States that IV steroids have helped in the past.   Recommendations: 1. IV Solumedrol 1000 mg qd x 5 days.  2. Monitor CBG, chem7 and CBC while on steroids 3. MRI of brain and cervical spine with and without contrast.  4. Will need close outpatient follow up with Dr. Merlene Laughter  for reinitiation of an alternate disease modifying agent for management of her PPMS (unable to treat with Tysabri or Ocrevus).     Electronically signed: Dr. Kerney Elbe 01/18/2019, 10:29 AM

## 2019-01-18 NOTE — Evaluation (Signed)
Occupational Therapy Evaluation Patient Details Name: Faith [name] MRN: [id number] DOB: Oct 02, 1955 Today's Date: 01/18/2019    History of Present Illness Pt is a 62 y.o. F admitted for an exacerbation of multiple sclerosis. She has a PMH including but not limited to COPD, back pain, and tobacco use.   Clinical Impression   Pt admitted with the above diagnoses and presents with below problem list. Pt will benefit from continued acute OT to address the below listed deficits and maximize independence with basic ADLs prior to d/c to venue below. At baseline pt is independent with basic ADLs, lives with son and grandson who provide occasional assist with IADLs as needed (ex washing dishes). Pt currently mod to max A with UB/LB ADLs, +2 assist for safety with SPT for access to [facility]. Pt with good family support and willingness to work with therapy. Feel she would benefit greatly from an intensive rehab program prior to returning home.      Follow Up Recommendations  CIR    Equipment Recommendations  Other (comment)(defer to next venue)    Recommendations for Other Services       Precautions / Restrictions Precautions Precautions: Fall Restrictions Weight Bearing Restrictions: No      Mobility Bed Mobility Overal bed mobility: Needs Assistance Bed Mobility: Sit to Supine     Supine to sit: Min guard Sit to supine: Min assist   General bed mobility comments: assist to guide BLE completely onto bed. Pt relying on gross strength and momentum.  Transfers Overall transfer level: Needs assistance Equipment used: Rolling walker (2 wheeled) Transfers: Sit to/from [facility] Sit to Stand: Mod assist Stand pivot transfers: Mod assist;+2 safety/equipment       General transfer comment: rw vs bed rail utilized. Pt relying on gross strength and momentum. Assist to steady and control descent.     Balance Overall balance assessment: Needs assistance Sitting-balance  support: Bilateral upper extremity supported;Feet unsupported;Single extremity supported Sitting balance-Leahy Scale: Poor Sitting balance - Comments: Pt demonstrates lack of trunk control and required support of at least one UE on bed/rail at EOB Postural control: Left lateral lean(anterior lean) Standing balance support: Bilateral upper extremity supported;During functional activity Standing balance-Leahy Scale: Poor Standing balance comment: BUE support, brief stand                           ADL either performed or assessed with clinical judgement   ADL Overall ADL's : Needs assistance/impaired Eating/Feeding: Set up;Minimal assistance;Sitting Eating/Feeding Details (indicate cue type and reason): Issued weighted utensils and plate guard as pt reports she has been struggling with feeding tasks Grooming: Sitting;Maximal assistance   Upper Body Bathing: Sitting;Maximal assistance   Lower Body Bathing: Sit to/from stand;Maximal assistance   Upper Body Dressing : Sitting;Maximal assistance   Lower Body Dressing: Maximal assistance;Sit to/from stand   Toilet Transfer: Moderate assistance;+2 for safety/equipment;Stand-pivot;RW Toilet Transfer Details (indicate cue type and reason): simulated with recliner to EOB. Pt using walker vs bed rail. Assist to power up, steady and pivot fully.  Toileting- Clothing Manipulation and Hygiene: Moderate assistance;Maximal assistance;Sit to/from stand;Sitting/lateral lean         General ADL Comments: Simulated toilet transfer (recliner to EOB) and bed mobility completed during session. Pt relying on gross strength and momentum to facilitate transfers and bed mobility.      Vision         Perception     Praxis  Pertinent Vitals/Pain Pain Assessment: Faces Faces Pain Scale: Hurts a little bit Pain Location: generalized Pain Descriptors / Indicators: Sore Pain Intervention(s): Monitored during session;Repositioned      Hand Dominance Right   Extremity/Trunk Assessment Upper Extremity Assessment Upper Extremity Assessment: RUE deficits/detail;LUE deficits/detail RUE Deficits / Details: 3/5 gross strength. Right handed but currently using L hand to feed self. RUE Coordination: decreased fine motor;decreased gross motor LUE Deficits / Details: 3+/5 gross strength. Pt reports she feels LUE is slightly stronger. R hand dominant but currently using L hand for feedind tasks LUE Coordination: decreased fine motor;decreased gross motor   Lower Extremity Assessment Lower Extremity Assessment: Defer to PT evaluation   Cervical / Trunk Assessment Cervical / Trunk Assessment: Kyphotic   Communication Communication Communication: No difficulties   Cognition Arousal/Alertness: Awake/alert Behavior During Therapy: WFL for tasks assessed/performed;Anxious Overall Cognitive Status: Within Functional Limits for tasks assessed                                     General Comments       Exercises Exercises: General Lower Extremity General Exercises - Lower Extremity Long Arc Quad: AROM;Both;10 reps;Seated Hip Flexion/Marching: AROM;Strengthening;Both;10 reps;Seated   Shoulder Instructions      Home Living Family/patient expects to be discharged to:: Private residence Living Arrangements: Children Available Help at Discharge: Family Type of Home: House Home Access: Stairs to enter Technical brewer of Steps: 4 Entrance Stairs-Rails: Left Home Layout: One level     Bathroom Shower/Tub: Occupational psychologist: Standard Bathroom Accessibility: No   Home Equipment: Environmental consultant - 4 wheels;Shower seat;Hospital bed          Prior Functioning/Environment Level of Independence: Needs assistance  Gait / Transfers Assistance Needed: Able to transfer/ambulate independently but with increased time and effort ADL's / Homemaking Assistance Needed: Needs assistance- son helps her  reach cabinets, do dishes, etc. Communication / Swallowing Assistance Needed: Independent          OT Problem List: Decreased strength;Decreased activity tolerance;Impaired balance (sitting and/or standing);Decreased knowledge of use of DME or AE;Impaired UE functional use;Pain;Decreased coordination      OT Treatment/Interventions: Self-care/ADL training;Therapeutic exercise;DME and/or AE instruction;Therapeutic activities;Patient/family education;Balance training    OT Goals(Current goals can be found in the care plan section) Acute Rehab OT Goals Patient Stated Goal: to get better and stronger OT Goal Formulation: With patient Time For Goal Achievement: 02/01/19 Potential to Achieve Goals: Good ADL Goals Pt Will Perform Eating: with set-up;with modified independence;with adaptive utensils;sitting Pt Will Perform Grooming: with modified independence;with set-up;sitting Pt Will Perform Upper Body Bathing: with modified independence;sitting Pt Will Perform Lower Body Bathing: with modified independence;sit to/from stand;sitting/lateral leans Pt Will Transfer to Toilet: with modified independence;ambulating Pt Will Perform Toileting - Clothing Manipulation and hygiene: with modified independence;sit to/from stand  OT Frequency: Min 2X/week   Barriers to D/C:            Co-evaluation              AM-PAC OT "6 Clicks" Daily Activity     Outcome Measure Help from another person eating meals?: A Little Help from another person taking care of personal grooming?: A Lot Help from another person toileting, which includes using toliet, bedpan, or urinal?: A Lot Help from another person bathing (including washing, rinsing, drying)?: A Lot Help from another person to put on and taking off regular upper body  clothing?: A Lot Help from another person to put on and taking off regular lower body clothing?: A Lot 6 Click Score: 13   End of Session Equipment Utilized During Treatment:  Rolling walker  Activity Tolerance: Patient tolerated treatment well Patient left: in bed;with call bell/phone within reach  OT Visit Diagnosis: Unsteadiness on feet (R26.81);Muscle weakness (generalized) (M62.81);History of falling (Z91.81);Other abnormalities of gait and mobility (R26.89)                Time: [id number] OT Time Calculation (min): 22 min Charges:  OT General Charges $OT Visit: 1 Visit OT Evaluation $OT Eval Low Complexity: [place], OT Acute Rehabilitation Services Pager: [phone] Office: [phone]   Hortencia Pilar 01/18/2019, 2:11 PM

## 2019-01-18 NOTE — Progress Notes (Signed)
PROGRESS NOTE    Patient: Faith [name]                            PCP: Doree Albee, MD                    DOB: 03-19-1955            DOA: 01/17/2019 [id number]             DOS: 01/18/2019, 11:05 AM   LOS: 1 day   Date of Service: The patient was seen and examined on 01/18/2019  Subjective:   The patient was seen and examined this morning, stable still reporting of severe generalized weaknesses especially In upper and lower extremity, with numbness to hands and feet.  Patient is well aware of her diagnosis she has been dealing with for years, under treatment with her neurologist.   Brief Narrative:   63 y.o. female with medical history significant for Multiple Sclerosis, chronic tobacco abuse, chronic back pain, who is admitted to [facility] on 01/17/2019 with multiple sclerosis exacerbation after presenting from home to [facility] emergency department complaining of progressive symmetrical weakness and diminished sensation involving the hands and feet over the course of the last month.   At [facility] tele-neurology was consulted Dr. Faustino Congress, who recommended Solu-Medrol 1000 mg daily x3-5 days, MRI of brain and cervical spine and transfer for close neurology consultation and follow-up as inpatient.  Assessment & Plan:   Principal Problem:   Multiple sclerosis exacerbation ([place]) Active Problems:   Tobacco abuse   Falls frequently   Multiple sclerosis ([facility])   Chronic back pain     Multiple sclerosis exacerbation:  -History of multiple sclerosis diagnosed 3 years ago, on infusion medications-last treatment 2 months ago  - 2 months after development of side-effects to Ocrelizumab infusion, the patient presents with 1 month of progressive -Currently presenting with symmetric weakness, with numbness exclusive to hands and feet bilaterally -Progressive weakness with difficulty ambulation -associated mechanical falls -Telemetry neurology at [facility], Dr. Faustino Congress recommended  Solu-Medrol 1000 mg IV Qdaily for 3-5 days, with first dose of Solu-Medrol administered at approximately 2200 on 01/17/2019.  -Transferred for inpatient evaluation by neurology -MRI of brain and cervical spine - checking B12 and TSH levels,  - inpatient PT/OT consults.  - Solu-Medrol 1000 mg IV daily x4 additional days.   -Neurology consulted in the morning of 01/18/2019>> appreciate evaluation recommendations  Frequent ground-level mechanical falls: -No traumatic injuries, likely due to above -PT/OT consultation, fall precaution       Chronic tobacco abuse: -Patient been consulted on smoking cessation -NicoDerm patch has been provided     Chronic back pain:  -Continue as needed analgesics, on tramadol as an outpatient       DVT prophylaxis: Lovenox 40 mg [place]ster qdaily.  Code Status: Full Family Communication: None Disposition Plan:  Per Rounding Team Consults called: Dr. Faustino Congress (teleneurologist), as above.  Admission status: inpatient; med-tele at [facility].      Procedures:   No admission procedures for hospital encounter.    Antimicrobials:  Anti-infectives (From admission, onward)   None       Medication:  . cholecalciferol  2,000 Units Oral BID  . enoxaparin (LOVENOX) injection  30 mg Subcutaneous Q24H  . gabapentin  600 mg Oral TID  . [START ON 01/19/2019] influenza vac split quadrivalent PF  0.5 mL Intramuscular Tomorrow-1000  .  propranolol  60 mg Oral BID  . sodium chloride flush  3 mL Intravenous Q12H    acetaminophen **OR** acetaminophen, nicotine, traMADol   Objective:   Vitals:   01/17/19 2230 01/17/19 2300 01/18/19 0050 01/18/19 0451  BP: (!) 146/91 (!) 166/85 (!) 160/86 (!) 146/89  Pulse: 62 64 70 71  Resp: 15 12 17 17   Temp:   (!) 97.5 F (36.4 C) 97.7 F (36.5 C)  TempSrc:   Oral Oral  SpO2: 96% 97% 94% 94%  Weight:      Height:        Intake/Output Summary (Last 24 hours) at 01/18/2019 1105  Last data filed at 01/18/2019 0915 Gross per 24 hour  Intake 480 ml  Output 300 ml  Net 180 ml   Filed Weights   01/17/19 1603  Weight: 45.4 kg     Examination:   Physical Exam  Constitution:  Alert, cooperative, no distress,  Appears calm and comfortable  Psychiatric: Normal and stable mood and affect, cognition intact,   HEENT: Normocephalic, PERRL, otherwise with in Normal limits  Chest:Chest symmetric Cardio vascular:  S1/S2, RRR, No murmure, No Rubs or Gallops  pulmonary: Clear to auscultation bilaterally, respirations unlabored, negative wheezes / crackles Abdomen: Soft, non-tender, non-distended, bowel sounds,no masses, no organomegaly Muscular skeletal: Limited exam -generalized weaknesses with a specific upper and lower extremity weakness upper extremity 4 out of 5 lower extremity 3 out of 5, with paresthesia arms and hands able to move all 4 extremities,   Neuro: CNII-XII intact.  Speech intact, sensory loss, numbness hands and feet bilaterally, reduced motor strength in upper and lower extremity -denies of any diplopia  Extremities: No pitting edema lower extremities, +2 pulses  Skin: Dry, warm to touch, negative for any Rashes, No open wounds Wounds: per nursing documentation  LABs:  CBC Latest Ref Rng & Units 01/17/2019 09/13/2018 12/17/2016  WBC 4.0 - 10.5 K/uL 8.3 8.3 10.3  Hemoglobin 12.0 - 15.0 g/dL 16.4(H) 17.0(H) 14.7  Hematocrit 36.0 - 46.0 % 47.7(H) 50.7(H) 41.8  Platelets 150 - 400 K/uL 333 268 305   CMP Latest Ref Rng & Units 01/17/2019 12/17/2016 06/08/2016  Glucose 70 - 99 mg/dL 110(H) 94 86  BUN 8 - 23 mg/dL 13 8 9   Creatinine 0.44 - 1.00 mg/dL 0.79 0.69 0.76  Sodium 135 - 145 mmol/L 137 137 134(L)  Potassium 3.5 - 5.1 mmol/L 4.1 4.3 4.4  Chloride 98 - 111 mmol/L 103 99 99  CO2 22 - 32 mmol/L 25 29 27   Calcium 8.9 - 10.3 mg/dL 8.9 9.7 9.2  Total Protein 6.1 - 8.1 g/dL - 6.8 6.4  Total Bilirubin 0.2 - 1.2 mg/dL - 0.4 0.2  Alkaline Phos 33 - 130  U/L - - 53  AST 10 - 35 U/L - 15 12  ALT 6 - 29 U/L - 10 10        SIGNED: Deatra James, MD, FACP, FHM. [facility],  Pager [phone]  If 7PM-7AM, please contact night-coverage [email or web address], Password [facility] 01/18/2019, 11:05 AM

## 2019-01-18 NOTE — Plan of Care (Signed)
  Problem: Health Behavior/Discharge Planning: Goal: Ability to manage health-related needs will improve Outcome: Progressing   

## 2019-01-18 NOTE — Consult Note (Signed)
Physical Medicine and Rehabilitation Consult Reason for Consult: Decreased functional mobility Referring Physician: [facility]   HPI: Faith [name] is a 63 y.o. right-handed female with history of COPD/tobacco abuse, back pain, multiple sclerosis diagnosed 3 years ago.  Per chart review patient lives with her children.  1 level home 4 steps to entry.  She was able to transfer ambulate independently with some increased time for tasks prior to admission.  Presented 01/17/2019 with progressive weakness.  MRI cervical spine and brain are pending.  Suspect MS exacerbation placed on IV Solu-Medrol 3 to 5 days.  Subcutaneous Lovenox for DVT prophylaxis.  Tolerating a regular diet.  Therapy evaluations completed recommendations of physical medicine rehab consult.   Review of Systems  Constitutional: Negative for chills and fever.  HENT: Negative for hearing loss.   Eyes: Positive for blurred vision. Negative for double vision.  Respiratory: Negative for cough and shortness of breath.   Cardiovascular: Positive for leg swelling. Negative for chest pain and palpitations.  Gastrointestinal: Positive for constipation. Negative for heartburn and nausea.  Genitourinary: Negative for dysuria, flank pain and hematuria.  Musculoskeletal: Positive for back pain and myalgias.  Skin: Negative for rash.  Neurological: Positive for weakness.  All other systems reviewed and are negative.  Past Medical History:  Diagnosis Date   Anemia    my whole life, Used to take iron   Arthritis    spine, lumbar   COPD (chronic obstructive pulmonary disease) (HCC)    Emphysema of lung ([place])    Narcolepsy    by history, has nightmares   Neuromuscular disorder ([place])    leg issues from spine   Past Surgical History:  Procedure Laterality Date   EYE SURGERY Right    ORIF FACIAL FRACTURE     TUBAL LIGATION     tubaligation     Family History  Problem Relation Age of Onset   Heart attack Mother     Kidney failure Mother    Diabetes Mother    Heart disease Mother    Hyperlipidemia Mother    Hypertension Mother    Heart attack Father 52   Hypertension Father    Hypertension Sister    Diabetes Sister    Other Brother        house fire   Hypertension Brother    Seizures Brother    Heart disease Brother        mitral valve   Arthritis Daughter        DJD, AS fibromyalgia   Polymyositis Daughter    Cancer Maternal Grandfather        lung   Heart disease Maternal Grandfather    Other Paternal Grandmother        house fire   Alcohol abuse Son    Social History:  reports that she has been smoking. She has a 11.25 pack-year smoking history. She has never used smokeless tobacco. She reports current alcohol use of about 2.0 standard drinks of alcohol per week. She reports that she does not use drugs. Allergies:  Allergies  Allergen Reactions   Darvon [Propoxyphene] Other (See Comments)    Hallicuations   Penicillins Rash    Did it involve swelling of the face/tongue/throat, SOB, or low BP? Unknown Did it involve sudden or severe rash/hives, skin peeling, or any reaction on the inside of your mouth or nose? Unknown Did you need to seek medical attention at a hospital or doctor's office? Unknown When did it  last happen?Unknown If all above answers are NO, may proceed with cephalosporin use.    Medications Prior to Admission  Medication Sig Dispense Refill   Cholecalciferol (VITAMIN D) 50 MCG (2000 UT) CAPS Take 2,000 Units by mouth 2 (two) times daily.      gabapentin (NEURONTIN) 300 MG capsule Take 1 capsule (300 mg total) by mouth 3 (three) times daily. start with one at bedtime.  Increase as tolerated to TID (Patient taking differently: Take 600 mg by mouth 3 (three) times daily. ) 180 capsule 3   IBU 800 MG tablet TAKE (1) TABLET BY MOUTH EVERY EIGHT HOURS AS NEEDED. (Patient taking differently: Take 800 mg by mouth every 8 (eight) hours as needed  for mild pain or moderate pain. ) 90 tablet 1   propranolol (INDERAL) 60 MG tablet Take 60 mg by mouth 2 (two) times daily.      traMADol (ULTRAM) 50 MG tablet Take 100 mg by mouth every 6 (six) hours as needed for moderate pain.       Home: Home Living Family/patient expects to be discharged to:: (P) Private residence Living Arrangements: (P) Children Available Help at Discharge: (P) Family Type of Home: (P) [place] Access: (P) Stairs to enter Entrance Stairs-Number of Steps: (P) 4 Entrance Stairs-Rails: (P) Left Home Layout: (P) One level Bathroom Shower/Tub: (P) Walk-in shower Bathroom Toilet: (P) Standard Bathroom Accessibility: (P) No Home Equipment: (P) Walker - 4 wheels, Shower seat, Hospital bed  Functional History: Prior Function Level of Independence: (P) Needs assistance Gait / Transfers Assistance Needed: (P) Able to transfer/ambulate independently but with increased time and effort ADL's / Homemaking Assistance Needed: (P) Needs assistance- son helps her reach cabinets, do dishes, etc. Communication / Swallowing Assistance Needed: (P) Independent Functional Status:  Mobility: Bed Mobility Overal bed mobility: (P) Needs Assistance Bed Mobility: (P) Sit to Supine Supine to sit: Min guard Sit to supine: (P) Min assist General bed mobility comments: (P) assist to guide BLE completely onto bed. Pt relying on gross strength and momentum. Transfers Overall transfer level: (P) Needs assistance Equipment used: (P) Rolling walker (2 wheeled) Transfers: (P) Sit to/from Stand, Stand Pivot Transfers Sit to Stand: (P) Mod assist Stand pivot transfers: (P) Mod assist, +2 safety/equipment General transfer comment: (P) rw vs bed rail utilized. Pt relying on gross strength and momentum. Assist to steady and control descent.  Ambulation/Gait Ambulation/Gait assistance: Mod assist, +2 safety/equipment Gait Distance (Feet): 2 Feet Assistive device: Rolling walker (2  wheeled) Gait Pattern/deviations: Step-to pattern, Decreased step length - right, Decreased step length - left, Shuffle General Gait Details: uncoordinated gait, demonstrated LE weakness and fatigued after 2 ft, had one episode of LOB Gait velocity: decr    ADL: ADL Overall ADL's : (P) Needs assistance/impaired Eating/Feeding: (P) Set up, Minimal assistance, Sitting Eating/Feeding Details (indicate cue type and reason): (P) Issued weighted utensils and plate guard as pt reports she has been struggling with feeding tasks Grooming: (P) Sitting, Maximal assistance Upper Body Bathing: (P) Sitting, Maximal assistance Lower Body Bathing: (P) Sit to/from stand, Maximal assistance Upper Body Dressing : (P) Sitting, Maximal assistance Lower Body Dressing: (P) Maximal assistance, Sit to/from stand Toilet Transfer: (P) Moderate assistance, +2 for safety/equipment, Stand-pivot, RW Toilet Transfer Details (indicate cue type and reason): (P) simulated with recliner to EOB. Pt using walker vs bed rail. Assist to power up, steady and pivot fully.  Toileting- Clothing Manipulation and Hygiene: (P) Moderate assistance, Maximal assistance, Sit to/from stand, Sitting/lateral lean General ADL  Comments: (P) Simulated toilet transfer (recliner to EOB) and bed mobility completed during session. Pt relying on gross strength and momentum to facilitate transfers and bed mobility.   Cognition: Cognition Overall Cognitive Status: (P) Within Functional Limits for tasks assessed Orientation Level: Oriented X4 Cognition Arousal/Alertness: (P) Awake/alert Behavior During Therapy: (P) WFL for tasks assessed/performed, Anxious Overall Cognitive Status: (P) Within Functional Limits for tasks assessed  Blood pressure (!) 146/89, pulse 71, temperature 97.7 F (36.5 C), temperature source Oral, resp. rate 17, height 5\' 6"  (1.676 m), weight 45.4 kg, SpO2 94 %. Physical Exam  Neurological:  Patient is alert.  Speech is a  bit dysarthric but intelligible.  Follows commands.  Oriented x3.  Gen: no distress, normal appearing HEENT: oral mucosa pink and moist, NCAT Cardio: Reg rate Chest: normal effort, normal rate of breathing Abd: soft, non-distended Ext: no edema Skin: intact Neuro/Musculoskeletal: AOx2. Diffusely hyperreflexic with significantly increased tone throughout--most severe at elbow and knee flexion bilaterally.  RUE: 4/5 in SA, EE, EF, and WF, 3/5 in hand grip. Decreased sensation throughout right upper extremity.  RLE: 2/5 HF, 4/5 KE, DF, PF LUE: 4/5 throughout LLE: 2/5 HF, 4/5 KE, DF, PF Psych: pleasant, normal affect  Results for orders placed or performed during the hospital encounter of 01/17/19 (from the past 24 hour(s))  CBC with Differential     Status: Abnormal   Collection Time: 01/17/19  5:17 PM  Result Value Ref Range   WBC 8.3 4.0 - 10.5 K/uL   RBC 4.75 3.87 - 5.11 MIL/uL   Hemoglobin 16.4 (H) 12.0 - 15.0 g/dL   HCT 47.7 (H) 36.0 - 46.0 %   MCV 100.4 (H) 80.0 - 100.0 fL   MCH 34.5 (H) 26.0 - 34.0 pg   MCHC 34.4 30.0 - 36.0 g/dL   RDW 12.4 11.5 - 15.5 %   Platelets 333 150 - 400 K/uL   nRBC 0.0 0.0 - 0.2 %   Neutrophils Relative % 65 %   Neutro Abs 5.3 1.7 - 7.7 K/uL   Lymphocytes Relative 26 %   Lymphs Abs 2.2 0.7 - 4.0 K/uL   Monocytes Relative 9 %   Monocytes Absolute 0.8 0.1 - 1.0 K/uL   Eosinophils Relative 0 %   Eosinophils Absolute 0.0 0.0 - 0.5 K/uL   Basophils Relative 0 %   Basophils Absolute 0.0 0.0 - 0.1 K/uL   Immature Granulocytes 0 %   Abs Immature Granulocytes 0.03 0.00 - 0.07 K/uL  Basic metabolic panel     Status: Abnormal   Collection Time: 01/17/19  5:17 PM  Result Value Ref Range   Sodium 137 135 - 145 mmol/L   Potassium 4.1 3.5 - 5.1 mmol/L   Chloride 103 98 - 111 mmol/L   CO2 25 22 - 32 mmol/L   Glucose, Bld 110 (H) 70 - 99 mg/dL   BUN 13 8 - 23 mg/dL   Creatinine, Ser 0.79 0.44 - 1.00 mg/dL   Calcium 8.9 8.9 - 10.3 mg/dL   GFR calc non  Af Amer >60 >60 mL/min   GFR calc Af Amer >60 >60 mL/min   Anion gap 9 5 - 15  Urinalysis, Routine w reflex microscopic     Status: None   Collection Time: 01/18/19 12:45 PM  Result Value Ref Range   Color, Urine YELLOW YELLOW   APPearance CLEAR CLEAR   Specific Gravity, Urine 1.011 1.005 - 1.030   pH 6.0 5.0 - 8.0   Glucose, UA NEGATIVE NEGATIVE  mg/dL   Hgb urine dipstick NEGATIVE NEGATIVE   Bilirubin Urine NEGATIVE NEGATIVE   Ketones, ur NEGATIVE NEGATIVE mg/dL   Protein, ur NEGATIVE NEGATIVE mg/dL   Nitrite NEGATIVE NEGATIVE   Leukocytes,Ua NEGATIVE NEGATIVE   No results found.   Assessment/Plan: Diagnosis: Impaired mobility and ADLs secondary to MS exacerbation.  1. Does the need for close, 24 hr/day medical supervision in concert with the patient's rehab needs make it unreasonable for this patient to be served in a less intensive setting? Yes 2. Co-Morbidities requiring supervision/potential complications: COPD, back pain, tobacco use.  3. Due to safety, skin/wound care, disease management, medication administration, pain management and patient education, does the patient require 24 hr/day rehab nursing? Yes 4. Does the patient require coordinated care of a physician, rehab nurse, therapy disciplines of PT, OT to address physical and functional deficits in the context of the above medical diagnosis(es)? Yes Addressing deficits in the following areas: balance, endurance, locomotion, strength, transferring, bathing, dressing, feeding, grooming, toileting, cognition and psychosocial support 5. Can the patient actively participate in an intensive therapy program of at least 3 hrs of therapy per day at least 5 days per week? Yes 6. The potential for patient to make measurable gains while on inpatient rehab is excellent 7. Anticipated functional outcomes upon discharge from inpatient rehab are modified independent  with PT, modified independent with OT, independent with  SLP. 8. Estimated rehab length of stay to reach the above functional goals is: 10-14 days 9. Anticipated discharge destination: Home 10. Overall Rehab/Functional Prognosis: excellent  RECOMMENDATIONS: This patient's condition is appropriate for continued rehabilitative care in the following setting: CIR Patient has agreed to participate in recommended program. Yes Note that insurance prior authorization may be required for reimbursement for recommended care.  Comment: Mr.s Roop is a 63 year old female suffering from MS exacerbation. She has impaired mobility and ADLs secondary to weakness and spasticity and would benefit from inpatient rehabilitation. 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 Can consider spine surgical consult given new spinal cord mass effect with weakness, hyperreflexia, and sensory abnormalities. Unclear how much of these symptoms are from MS vs cervical spine progressive degeneration. She has good social support at home, where she lives with her son and 22-year-old grandson. She has been sleeping well and had a BM the day before yesterday. She would benefit from Baclofen for her spasticity--can start 5mg  TID and can uptitrate if well tolerated. Thank you for this consult.   Lavon Paganini Angiulli, PA-C 01/18/2019   I have personally performed a face to face diagnostic evaluation, including, but not limited to relevant history and physical exam findings, of this patient and developed relevant assessment and plan.  Additionally, I have reviewed and concur with the physician assistant's documentation above.  Leeroy Cha, MD

## 2019-01-18 NOTE — Progress Notes (Signed)
Brief note regarding plan, with full H&P to follow:  63 y.o. female with medical history significant for multiple sclerosis, chronic tobacco abuse, chronic back pain, who is admitted to [facility] on 01/17/2019 with multiple sclerosis exacerbation after presenting from home to [facility] emergency department complaining of progressive symmetrical weakness and diminished sensation involving the hands and feet over the course of the last month.   In [facility] emergency department, the patient was evaluated by Dr. Faustino Congress, teleneurologist, who felt that the presentation was consistent with exacerbation of underlying multiple sclerosis.  A recommended Solu-Medrol x3 to 5 days, with first dose administered in [facility] emergency department.  He also recommended transfer to [facility] for MRI of the brain and cervical spine with and without contrast as well as inpatient neurology consult in the morning.     Babs Bertin, DO Hospitalist

## 2019-01-18 NOTE — Progress Notes (Signed)
Inpatient Rehab Admissions:  Inpatient Rehab Consult received. Noted pt has been started on 5 days of IV solumedrol. Will follow closely to determine if pt has IP Rehab needs after she has completed her course of Solumedrol.   Please call if questions.   Jhonnie Garner, OTR/L  Rehab Admissions Coordinator  [phone] 01/18/2019 5:39 PM

## 2019-01-18 NOTE — Plan of Care (Signed)
  Problem: Education: Goal: Knowledge of General Education information will improve Description Including pain rating scale, medication(s)/side effects and non-pharmacologic comfort measures Outcome: Progressing   

## 2019-01-19 ENCOUNTER — Encounter (HOSPITAL_COMMUNITY): Payer: Self-pay | Admitting: *Deleted

## 2019-01-19 ENCOUNTER — Inpatient Hospital Stay (HOSPITAL_COMMUNITY): Payer: Medicaid Other | Admitting: Anesthesiology

## 2019-01-19 ENCOUNTER — Inpatient Hospital Stay: Admit: 2019-01-19 | Payer: Medicaid Other | Admitting: Neurosurgery

## 2019-01-19 ENCOUNTER — Inpatient Hospital Stay (HOSPITAL_COMMUNITY): Payer: Medicaid Other

## 2019-01-19 ENCOUNTER — Encounter (HOSPITAL_COMMUNITY)
Admission: EM | Disposition: A | Discharge status: Rehabilitation Facility | Payer: Self-pay | Source: Home / Self Care | Attending: Family Medicine

## 2019-01-19 DIAGNOSIS — G35 Multiple sclerosis: Secondary | ICD-10-CM

## 2019-01-19 DIAGNOSIS — M4312 Spondylolisthesis, cervical region: Secondary | ICD-10-CM | POA: Diagnosis present

## 2019-01-19 HISTORY — PX: ANTERIOR CERVICAL DECOMP/DISCECTOMY FUSION: SHX1161

## 2019-01-19 HISTORY — PX: POSTERIOR CERVICAL LAMINECTOMY: SHX2248

## 2019-01-19 LAB — MRSA PCR SCREENING: MRSA by PCR: NEGATIVE

## 2019-01-19 LAB — VITAMIN B12: Vitamin B-12: 443 pg/mL (ref 180–914)

## 2019-01-19 LAB — TSH: TSH: 0.951 u[IU]/mL (ref 0.350–4.500)

## 2019-01-19 SURGERY — POSTERIOR CERVICAL LAMINECTOMY
Anesthesia: General | Site: Neck

## 2019-01-19 MED ORDER — MIDAZOLAM HCL 5 MG/5ML IJ SOLN
INTRAMUSCULAR | Status: DC | PRN
  Administered 2019-01-19 (×2): 1 mg via INTRAVENOUS

## 2019-01-19 MED ORDER — VANCOMYCIN HCL IN DEXTROSE 1-5 GM/200ML-% IV SOLN
INTRAVENOUS | Status: AC
  Filled 2019-01-19: qty 200

## 2019-01-19 MED ORDER — PHENYLEPHRINE HCL-NACL 10-0.9 MG/250ML-% IV SOLN
INTRAVENOUS | Status: DC | PRN
  Administered 2019-01-19: 40 ug/min via INTRAVENOUS

## 2019-01-19 MED ORDER — BUPIVACAINE HCL (PF) 0.5 % IJ SOLN
INTRAMUSCULAR | Status: DC | PRN
  Administered 2019-01-19: 10 mL

## 2019-01-19 MED ORDER — ARTIFICIAL TEARS OPHTHALMIC OINT
TOPICAL_OINTMENT | OPHTHALMIC | Status: AC
  Filled 2019-01-19: qty 3.5

## 2019-01-19 MED ORDER — LIDOCAINE 2% (20 MG/ML) 5 ML SYRINGE
INTRAMUSCULAR | Status: AC
  Filled 2019-01-19: qty 5

## 2019-01-19 MED ORDER — ONDANSETRON HCL 4 MG/2ML IJ SOLN
INTRAMUSCULAR | Status: AC
  Filled 2019-01-19: qty 2

## 2019-01-19 MED ORDER — OXYCODONE HCL 5 MG PO TABS
10.0000 mg | ORAL_TABLET | ORAL | Status: DC | PRN
  Administered 2019-01-20 – 2019-01-22 (×5): 10 mg via ORAL
  Filled 2019-01-19 (×6): qty 2

## 2019-01-19 MED ORDER — PROPOFOL 10 MG/ML IV BOLUS
INTRAVENOUS | Status: AC
  Filled 2019-01-19: qty 20

## 2019-01-19 MED ORDER — THROMBIN 5000 UNITS EX SOLR
CUTANEOUS | Status: DC | PRN
  Administered 2019-01-19 (×2): 5000 [IU] via TOPICAL

## 2019-01-19 MED ORDER — ALBUMIN HUMAN 5 % IV SOLN
INTRAVENOUS | Status: DC | PRN
  Administered 2019-01-19: 17:00:00 via INTRAVENOUS

## 2019-01-19 MED ORDER — MENTHOL 3 MG MT LOZG: 1.0000 | LOZENGE | OROMUCOSAL | Status: DC | PRN

## 2019-01-19 MED ORDER — ROCURONIUM BROMIDE 100 MG/10ML IV SOLN
INTRAVENOUS | Status: DC | PRN
  Administered 2019-01-19: 10 mg via INTRAVENOUS
  Administered 2019-01-19: 50 mg via INTRAVENOUS
  Administered 2019-01-19: 20 mg via INTRAVENOUS

## 2019-01-19 MED ORDER — VANCOMYCIN HCL 1000 MG IV SOLR
INTRAVENOUS | Status: DC | PRN
  Administered 2019-01-19: 1000 mg via INTRAVENOUS

## 2019-01-19 MED ORDER — LIDOCAINE HCL (CARDIAC) PF 100 MG/5ML IV SOSY
PREFILLED_SYRINGE | INTRAVENOUS | Status: DC | PRN
  Administered 2019-01-19: 60 mg via INTRATRACHEAL

## 2019-01-19 MED ORDER — 0.9 % SODIUM CHLORIDE (POUR BTL) OPTIME
TOPICAL | Status: DC | PRN
  Administered 2019-01-19: 1000 mL

## 2019-01-19 MED ORDER — LIDOCAINE-EPINEPHRINE 0.5 %-1:200000 IJ SOLN
INTRAMUSCULAR | Status: AC
  Filled 2019-01-19: qty 1

## 2019-01-19 MED ORDER — DEXAMETHASONE SODIUM PHOSPHATE 10 MG/ML IJ SOLN
INTRAMUSCULAR | Status: AC
  Filled 2019-01-19: qty 1

## 2019-01-19 MED ORDER — THROMBIN 20000 UNITS EX SOLR
CUTANEOUS | Status: DC | PRN
  Administered 2019-01-19: 20 mL via TOPICAL

## 2019-01-19 MED ORDER — THROMBIN 20000 UNITS EX SOLR
CUTANEOUS | Status: AC
  Filled 2019-01-19: qty 20000

## 2019-01-19 MED ORDER — FENTANYL CITRATE (PF) 100 MCG/2ML IJ SOLN
INTRAMUSCULAR | Status: AC
  Filled 2019-01-19: qty 2

## 2019-01-19 MED ORDER — FENTANYL CITRATE (PF) 100 MCG/2ML IJ SOLN
25.0000 ug | INTRAMUSCULAR | Status: DC | PRN
  Administered 2019-01-19 (×2): 50 ug via INTRAVENOUS

## 2019-01-19 MED ORDER — LACTATED RINGERS IV SOLN
INTRAVENOUS | Status: DC | PRN
  Administered 2019-01-19: 17:00:00 via INTRAVENOUS

## 2019-01-19 MED ORDER — ONDANSETRON HCL 4 MG/2ML IJ SOLN
INTRAMUSCULAR | Status: DC | PRN
  Administered 2019-01-19: 4 mg via INTRAVENOUS

## 2019-01-19 MED ORDER — SODIUM CHLORIDE 0.9 % IV SOLN: 250.0000 mL | INTRAVENOUS | Status: DC

## 2019-01-19 MED ORDER — MORPHINE SULFATE (PF) 2 MG/ML IV SOLN: 1.0000 mg | INTRAVENOUS | Status: DC | PRN

## 2019-01-19 MED ORDER — DEXAMETHASONE SODIUM PHOSPHATE 10 MG/ML IJ SOLN
INTRAMUSCULAR | Status: DC | PRN
  Administered 2019-01-19: 10 mg via INTRAVENOUS
  Administered 2019-01-19: 4 mg via INTRAVENOUS

## 2019-01-19 MED ORDER — EPHEDRINE SULFATE 50 MG/ML IJ SOLN
INTRAMUSCULAR | Status: DC | PRN
  Administered 2019-01-19 (×4): 5 mg via INTRAVENOUS

## 2019-01-19 MED ORDER — PHENOL 1.4 % MT LIQD: 1.0000 | OROMUCOSAL | Status: DC | PRN

## 2019-01-19 MED ORDER — HEMOSTATIC AGENTS (NO CHARGE) OPTIME
TOPICAL | Status: DC | PRN
  Administered 2019-01-19: 1 via TOPICAL

## 2019-01-19 MED ORDER — LACTATED RINGERS IV SOLN
INTRAVENOUS | Status: DC | PRN
  Administered 2019-01-19 (×2): via INTRAVENOUS

## 2019-01-19 MED ORDER — SODIUM CHLORIDE (PF) 0.9 % IJ SOLN
INTRAMUSCULAR | Status: AC
  Filled 2019-01-19: qty 10

## 2019-01-19 MED ORDER — SODIUM CHLORIDE 0.9% FLUSH
3.0000 mL | Freq: Two times a day (BID) | INTRAVENOUS | Status: DC
  Administered 2019-01-21 – 2019-01-22 (×2): 3 mL via INTRAVENOUS

## 2019-01-19 MED ORDER — PROPOFOL 10 MG/ML IV BOLUS
INTRAVENOUS | Status: DC | PRN
  Administered 2019-01-19: 120 mg via INTRAVENOUS

## 2019-01-19 MED ORDER — CELECOXIB 200 MG PO CAPS
200.0000 mg | ORAL_CAPSULE | Freq: Two times a day (BID) | ORAL | Status: DC
  Administered 2019-01-19 – 2019-01-22 (×6): 200 mg via ORAL
  Filled 2019-01-19 (×7): qty 1

## 2019-01-19 MED ORDER — MIDAZOLAM HCL 2 MG/2ML IJ SOLN
INTRAMUSCULAR | Status: AC
  Filled 2019-01-19: qty 2

## 2019-01-19 MED ORDER — BUPIVACAINE HCL (PF) 0.5 % IJ SOLN
INTRAMUSCULAR | Status: AC
  Filled 2019-01-19: qty 30

## 2019-01-19 MED ORDER — THROMBIN 5000 UNITS EX SOLR
CUTANEOUS | Status: AC
  Filled 2019-01-19: qty 10000

## 2019-01-19 MED ORDER — ONDANSETRON HCL 4 MG/2ML IJ SOLN: 4.0000 mg | Freq: Once | INTRAMUSCULAR | Status: DC | PRN

## 2019-01-19 MED ORDER — ONDANSETRON HCL 4 MG/2ML IJ SOLN: 4.0000 mg | Freq: Four times a day (QID) | INTRAMUSCULAR | Status: DC | PRN

## 2019-01-19 MED ORDER — LIDOCAINE-EPINEPHRINE 0.5 %-1:200000 IJ SOLN
INTRAMUSCULAR | Status: DC | PRN
  Administered 2019-01-19: 6 mL
  Administered 2019-01-19: 4 mL via INTRADERMAL

## 2019-01-19 MED ORDER — SUGAMMADEX SODIUM 200 MG/2ML IV SOLN
INTRAVENOUS | Status: DC | PRN
  Administered 2019-01-19: 200 mg via INTRAVENOUS

## 2019-01-19 MED ORDER — ONDANSETRON HCL 4 MG PO TABS: 4.0000 mg | ORAL_TABLET | Freq: Four times a day (QID) | ORAL | Status: DC | PRN

## 2019-01-19 MED ORDER — SODIUM CHLORIDE 0.9% FLUSH: 3.0000 mL | INTRAVENOUS | Status: DC | PRN

## 2019-01-19 MED ORDER — FENTANYL CITRATE (PF) 250 MCG/5ML IJ SOLN
INTRAMUSCULAR | Status: AC
  Filled 2019-01-19: qty 5

## 2019-01-19 MED ORDER — ROCURONIUM BROMIDE 10 MG/ML (PF) SYRINGE
PREFILLED_SYRINGE | INTRAVENOUS | Status: AC
  Filled 2019-01-19: qty 20

## 2019-01-19 MED ORDER — FENTANYL CITRATE (PF) 250 MCG/5ML IJ SOLN
INTRAMUSCULAR | Status: DC | PRN
  Administered 2019-01-19 (×5): 50 ug via INTRAVENOUS

## 2019-01-19 MED ORDER — EPHEDRINE 5 MG/ML INJ
INTRAVENOUS | Status: AC
  Filled 2019-01-19: qty 10

## 2019-01-19 MED ORDER — POTASSIUM CHLORIDE IN NACL 20-0.9 MEQ/L-% IV SOLN
INTRAVENOUS | Status: DC
  Administered 2019-01-20 – 2019-01-22 (×5): via INTRAVENOUS
  Filled 2019-01-19 (×7): qty 1000

## 2019-01-19 MED ORDER — DIAZEPAM 5 MG PO TABS: 5.0000 mg | ORAL_TABLET | Freq: Four times a day (QID) | ORAL | Status: DC | PRN

## 2019-01-19 MED ORDER — PHENYLEPHRINE 40 MCG/ML (10ML) SYRINGE FOR IV PUSH (FOR BLOOD PRESSURE SUPPORT)
PREFILLED_SYRINGE | INTRAVENOUS | Status: AC
  Filled 2019-01-19: qty 10

## 2019-01-19 SURGICAL SUPPLY — 75 items
ALCOHOL 70% 16 OZ (MISCELLANEOUS) ×2 IMPLANT
BAG DECANTER FOR FLEXI CONT (MISCELLANEOUS) ×3 IMPLANT
BENZOIN TINCTURE PRP APPL 2/3 (GAUZE/BANDAGES/DRESSINGS) ×3 IMPLANT
BIT DRILL NEURO 2X3.1 SFT TUCH (MISCELLANEOUS) IMPLANT
BLADE CLIPPER SURG (BLADE) ×1 IMPLANT
BLADE ULTRA TIP 2M (BLADE) ×1 IMPLANT
BLOCKER OASYS (Neuro Prosthesis/Implant) ×4 IMPLANT
BUR DRUM 4.0 (BURR) ×2 IMPLANT
BUR MATCHSTICK NEURO 3.0 LAGG (BURR) ×3 IMPLANT
CANISTER SUCT 3000ML PPV (MISCELLANEOUS) ×5 IMPLANT
CARTRIDGE OIL MAESTRO DRILL (MISCELLANEOUS) ×4 IMPLANT
CONT SPEC 4OZ CLIKSEAL STRL BL (MISCELLANEOUS) ×2 IMPLANT
COVER WAND RF STERILE (DRAPES) ×6 IMPLANT
DECANTER SPIKE VIAL GLASS SM (MISCELLANEOUS) ×5 IMPLANT
DERMABOND ADVANCED (GAUZE/BANDAGES/DRESSINGS) ×2
DERMABOND ADVANCED .7 DNX12 (GAUZE/BANDAGES/DRESSINGS) ×2 IMPLANT
DIFFUSER DRILL AIR PNEUMATIC (MISCELLANEOUS) ×5 IMPLANT
DRAPE HALF SHEET 40X57 (DRAPES) IMPLANT
DRAPE LAPAROTOMY 100X72 PEDS (DRAPES) ×6 IMPLANT
DRAPE MICROSCOPE LEICA (MISCELLANEOUS) ×5 IMPLANT
DRAPE POUCH INSTRU U-SHP 10X18 (DRAPES) ×5 IMPLANT
DRILL NEURO 2X3.1 SOFT TOUCH (MISCELLANEOUS) ×3
DURAPREP 6ML APPLICATOR 50/CS (WOUND CARE) ×6 IMPLANT
ELECT COATED BLADE 2.86 ST (ELECTRODE) ×3 IMPLANT
ELECT REM PT RETURN 9FT ADLT (ELECTROSURGICAL) ×6
ELECTRODE REM PT RTRN 9FT ADLT (ELECTROSURGICAL) ×4 IMPLANT
GAUZE 4X4 16PLY RFD (DISPOSABLE) IMPLANT
GLOVE ECLIPSE 6.5 STRL STRAW (GLOVE) ×6 IMPLANT
GLOVE EXAM NITRILE XL STR (GLOVE) IMPLANT
GOWN STRL REUS W/ TWL LRG LVL3 (GOWN DISPOSABLE) ×8 IMPLANT
GOWN STRL REUS W/ TWL XL LVL3 (GOWN DISPOSABLE) IMPLANT
GOWN STRL REUS W/TWL 2XL LVL3 (GOWN DISPOSABLE) IMPLANT
GOWN STRL REUS W/TWL LRG LVL3 (GOWN DISPOSABLE) ×4
GOWN STRL REUS W/TWL XL LVL3 (GOWN DISPOSABLE)
KIT BASIN OR (CUSTOM PROCEDURE TRAY) ×5 IMPLANT
KIT TURNOVER KIT B (KITS) ×5 IMPLANT
MARKER SKIN DUAL TIP RULER LAB (MISCELLANEOUS) ×3 IMPLANT
NDL HYPO 25X1 1.5 SAFETY (NEEDLE) ×4 IMPLANT
NDL SPNL 22GX3.5 QUINCKE BK (NEEDLE) ×2 IMPLANT
NEEDLE HYPO 25X1 1.5 SAFETY (NEEDLE) ×3 IMPLANT
NEEDLE SPNL 22GX3.5 QUINCKE BK (NEEDLE) ×3 IMPLANT
NS IRRIG 1000ML POUR BTL (IV SOLUTION) ×5 IMPLANT
OIL CARTRIDGE MAESTRO DRILL (MISCELLANEOUS) ×3
PACK LAMINECTOMY NEURO (CUSTOM PROCEDURE TRAY) ×5 IMPLANT
PAD ARMBOARD 7.5X6 YLW CONV (MISCELLANEOUS) ×7 IMPLANT
PATTIES SURGICAL .25X.25 (GAUZE/BANDAGES/DRESSINGS) IMPLANT
PIN DISTRACTION 14MM (PIN) IMPLANT
PIN MAYFIELD SKULL DISP (PIN) ×2 IMPLANT
PLATE AVIATOR ASSY 1LVL SZ 14 (Plate) ×1 IMPLANT
ROD OASYS 3.5X25MM (Rod) ×1 IMPLANT
ROD OASYS 3.5X30MM (Rod) ×1 IMPLANT
RUBBERBAND STERILE (MISCELLANEOUS) ×10 IMPLANT
SCREW AVIAT VAR SLFDRL 4X12 (Screw) ×3 IMPLANT
SCREW AVIATOR VAR SELFTAP 4X10 (Screw) ×1 IMPLANT
SCREW AVIATOR VAR SELFTAP 4X12 (Screw) ×1 IMPLANT
SCREW BIASED ANGLE 3.5X10 (Screw) ×4 IMPLANT
SLEEVE SURGEON STRL (DRAPES) ×1 IMPLANT
SPACER ACF PARALLEL 7MM (Bone Implant) ×1 IMPLANT
SPONGE INTESTINAL PEANUT (DISPOSABLE) ×3 IMPLANT
SPONGE SURGIFOAM ABS GEL SZ50 (HEMOSTASIS) ×5 IMPLANT
STAPLER SKIN PROX WIDE 3.9 (STAPLE) ×3 IMPLANT
STRIP CLOSURE SKIN 1/2X4 (GAUZE/BANDAGES/DRESSINGS) ×3 IMPLANT
SUT ETHILON 3 0 FSL (SUTURE) IMPLANT
SUT VIC AB 0 CT1 18XCR BRD8 (SUTURE) ×2 IMPLANT
SUT VIC AB 0 CT1 27 (SUTURE)
SUT VIC AB 0 CT1 27XBRD ANTBC (SUTURE) IMPLANT
SUT VIC AB 0 CT1 8-18 (SUTURE) ×1
SUT VIC AB 2-0 CT1 18 (SUTURE) ×3 IMPLANT
SUT VIC AB 3-0 SH 8-18 (SUTURE) ×6 IMPLANT
TAP SURG 3.5M DIA SPINAL CANC (TAP) ×2 IMPLANT
TAPE CLOTH 3X10 TAN LF (GAUZE/BANDAGES/DRESSINGS) ×3 IMPLANT
TOWEL GREEN STERILE (TOWEL DISPOSABLE) ×5 IMPLANT
TOWEL GREEN STERILE FF (TOWEL DISPOSABLE) ×5 IMPLANT
UNDERPAD 30X30 (UNDERPADS AND DIAPERS) ×2 IMPLANT
WATER STERILE IRR 1000ML POUR (IV SOLUTION) ×5 IMPLANT

## 2019-01-19 NOTE — Op Note (Signed)
01/19/2019  9:33 PM  PATIENT:  Faith [name]  63 y.o. 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 Milde was taken to the operating room, intubated, and placed under general anesthesia without difficulty. She was initially positioned prone on body rolls and her head positioned on a horseshoe headrest. Initial fluoroscopy showed the kyphosis was reduced just with positioning. Her neck was prepped and draped in sterile fashion. I infiltrated lidocaine in the planned incision. I opened the skin with a 10 blade and dissected sharply to the deep cervical fascia. I exposed the C3 and C4 lamina bilaterally with monopolar cautery in the subperiosteal plane. I then placed 4 screws in the lateral masses of C3, and C4 bilaterally. I proceeded with a cervical laminectomy and resected the synovial cyst which was adherent to the dura.  With the spinal canal now decompressed I placed rods in the screw heads and secured them with locking caps. I used Agricultural consultant. I used the drill to decorticate the facets at C3/4 and placed autograft morsels in the facets. I irrigated the wound then started to close. I approximated the deep cervical fascia, subcutaneous, and subcuticular planes with vicryl sutures. I  applied dermabond for a sterile dressing.  We then moved the patient onto the stretcher in the supine position.   Ms. Salvetti was positioned supine with her head in slight extension on a horseshoe headrest. The neck was prepped and draped in a sterile manner. I infiltrated 4 cc's 1/2%lidocaine/1:200,000 strength epinephrine into the planned incision starting from the midline to the medial border of the left sternocleidomastoid muscle. I opened the incision with a 10 blade and dissected sharply through soft tissue to the platysma. I dissected in the plane superior to the platysma both rostrally and caudally. I then opened the platysma in a horizontal fashion with Metzenbaum scissors, and dissected in the inferior plane rostrally and caudally. With both blunt and sharp technique I created an avascular corridor to the cervical spine. I placed a spinal needle(s) in the disc space at C3/4 . I then reflected the longus colli from C3 to C4 and placed self retaining retractors. I opened the disc space(s) at C3/4 with a 15 blade. I removed disc with curettes, Kerrison punches, and the drill. Using the drill I removed osteophytes and prepared for the decompression.  I decompressed the spinal canal and the C4 root(s) with the drill, Kerrison punches, and the curettes. I used the microscope to aid in microdissection. I removed the posterior longitudinal ligament to fully expose and decompress the thecal sac. I exposed the roots laterally taking down the C3/4 uncovertebral joints. With the decompression complete I moved on to the arthrodesis. I used the drill to level the surfaces of C3 and 4. I removed soft tissue to prepare the disc space and the bony surfaces. I measured the space and placed a 87mm structural allograft into the disc space.  I  then placed the anterior instrumentation. I placed 2 screws in each vertebral body through the plate. I locked the screws into place. Intraoperative xray showed the graft, plate, and  screws to be in good position. I irrigated the wound, achieved hemostasis, and closed the wound in layers. I approximated the platysma, and the subcuticular plane with vicryl sutures. I used Dermabond for a sterile dressing.   PLAN OF CARE: Admit to inpatient   PATIENT DISPOSITION:  PACU - hemodynamically stable.   Delay start of Pharmacological VTE agent (>24hrs) due to surgical blood loss or risk of bleeding:  yes

## 2019-01-19 NOTE — Anesthesia Procedure Notes (Signed)
Procedure Name: Intubation Date/Time: 01/19/2019 5:50 PM Performed by: Clovis Cao, CRNA Pre-anesthesia Checklist: Patient identified, Emergency Drugs available, Suction available, Patient being monitored and Timeout performed Patient Re-evaluated:Patient Re-evaluated prior to induction Oxygen Delivery Method: Circle system utilized Preoxygenation: Pre-oxygenation with 100% oxygen Induction Type: IV induction Ventilation: Mask ventilation without difficulty Laryngoscope Size: Glidescope Grade View: Grade I Tube type: Oral Tube size: 7.0 mm Number of attempts: 1 Airway Equipment and Method: Stylet and Video-laryngoscopy Placement Confirmation: ETT inserted through vocal cords under direct vision,  positive ETCO2 and breath sounds checked- equal and bilateral Secured at: 21 cm Tube secured with: Tape Dental Injury: Teeth and Oropharynx as per pre-operative assessment

## 2019-01-19 NOTE — Anesthesia Preprocedure Evaluation (Addendum)
Anesthesia Evaluation  Patient identified by MRN, date of birth, ID band Patient awake    Reviewed: Allergy & Precautions, NPO status , Patient's Chart, lab work & pertinent test results  Airway Mallampati: II   Neck ROM: Full    Dental  (+) Edentulous Upper, Poor Dentition   Pulmonary Current Smoker,    breath sounds clear to auscultation       Cardiovascular  Rhythm:Regular Rate:Normal     Neuro/Psych    GI/Hepatic   Endo/Other    Renal/GU      Musculoskeletal   Abdominal   Peds  Hematology   Anesthesia Other Findings   Reproductive/Obstetrics                             Anesthesia Physical Anesthesia Plan  ASA: III and emergent  Anesthesia Plan: General   Post-op Pain Management:    Induction: Intravenous  PONV Risk Score and Plan: Ondansetron and Dexamethasone  Airway Management Planned: Oral ETT  Additional Equipment:   Intra-op Plan:   Post-operative Plan: Extubation in OR  Informed Consent: I have reviewed the patients History and Physical, chart, labs and discussed the procedure including the risks, benefits and alternatives for the proposed anesthesia with the patient or authorized representative who has indicated his/her understanding and acceptance.     Dental advisory given  Plan Discussed with: CRNA and Anesthesiologist  Anesthesia Plan Comments:         Anesthesia Quick Evaluation

## 2019-01-19 NOTE — Progress Notes (Signed)
PT Progress Note for Charges    01/19/19 1300  PT Visit Information  Last PT Received On 01/19/19  PT General Charges  $$ ACUTE PT VISIT 1 Visit  PT Treatments  $Gait Training 8-22 mins  Anastasio Champion, DPT  Acute Rehabilitation Services Pager [phone] Office [phone]

## 2019-01-19 NOTE — Transfer of Care (Signed)
Immediate Anesthesia Transfer of Care Note  Patient: Faith [name]  Procedure(s) Performed: POSTERIOR CERVICAL LAMINECTOMY Removal of Synovial Cyst and posterior cervical fusion (N/A ) ANTERIOR CERVICAL DECOMPRESSION/DISCECTOMY Cervical three - four (N/A Neck)  Patient Location: PACU  Anesthesia Type:General  Level of Consciousness: drowsy  Airway & Oxygen Therapy: Patient Spontanous Breathing and Patient connected to face mask oxygen  Post-op Assessment: Report given to RN and Post -op Vital signs reviewed and stable  Post vital signs: Reviewed and stable  Last Vitals:  Vitals Value Taken Time  BP 144/99 01/19/19 2145  Temp    Pulse 77 01/19/19 2146  Resp 13 01/19/19 2146  SpO2 100 % 01/19/19 2146  Vitals shown include unvalidated device data.  Last Pain:  Vitals:   01/19/19 1604  TempSrc: Oral  PainSc:       Patients Stated Pain Goal: 0 ([id number])  Complications: No apparent anesthesia complications

## 2019-01-19 NOTE — Consult Note (Signed)
Reason for Consult:spinal cord compression, synovial cyst Referring Physician: Sharrie [name] is an 63 y.o. female.  HPI: with known MS. She over the last two weeks has had a rapid decline in strength, mobility, and coordination. Faith [name] was admitted on 11/18 for these issues. An MRI was ordered which revealed a synovial cyst compressing the spinal cord. I was asked to see the patient. She reports very poor sensation in the lower extremities during this time, along with multiple falls. This was thought to possibly be an MS exacerbation, and she was treated with steroids.  Past Medical History:  Diagnosis Date  . Anemia    my whole life, Used to take iron  . Arthritis    spine, lumbar  . COPD (chronic obstructive pulmonary disease) ([place])   . Emphysema of lung ([place])   . Narcolepsy    by history, has nightmares  . Neuromuscular disorder ([place])    leg issues from spine    Past Surgical History:  Procedure Laterality Date  . EYE SURGERY Right   . ORIF FACIAL FRACTURE    . TUBAL LIGATION    . tubaligation      Family History  Problem Relation Age of Onset  . Heart attack Mother   . Kidney failure Mother   . Diabetes Mother   . Heart disease Mother   . Hyperlipidemia Mother   . Hypertension Mother   . Heart attack Father 30  . Hypertension Father   . Hypertension Sister   . Diabetes Sister   . Other Brother        house fire  . Hypertension Brother   . Seizures Brother   . Heart disease Brother        mitral valve  . Arthritis Daughter        DJD, AS fibromyalgia  . Polymyositis Daughter   . Cancer Maternal Grandfather        lung  . Heart disease Maternal Grandfather   . Other Paternal Grandmother        house fire  . Alcohol abuse Son     Social History:  reports that she has been smoking. She has a 11.25 pack-year smoking history. She has never used smokeless tobacco. She reports current alcohol use of about 2.0 standard drinks of alcohol per  week. She reports that she does not use drugs.  Allergies:  Allergies  Allergen Reactions  . Darvon [Propoxyphene] Other (See Comments)    Hallicuations  . Penicillins Rash    Did it involve swelling of the face/tongue/throat, SOB, or low BP? Unknown Did it involve sudden or severe rash/hives, skin peeling, or any reaction on the inside of your mouth or nose? Unknown Did you need to seek medical attention at a hospital or doctor's office? Unknown When did it last happen?Unknown If all above answers are "NO", may proceed with cephalosporin use.     Medications: I have reviewed the patient's current medications.  Results for orders placed or performed during the hospital encounter of 01/17/19 (from the past 48 hour(s))  CBC with Differential     Status: Abnormal   Collection Time: 01/17/19  5:17 PM  Result Value Ref Range   WBC 8.3 4.0 - 10.5 K/uL   RBC 4.75 3.87 - 5.11 MIL/uL   Hemoglobin 16.4 (H) 12.0 - 15.0 g/dL   HCT 47.7 (H) 36.0 - 46.0 %   MCV 100.4 (H) 80.0 - 100.0 fL   MCH 34.5 (H) 26.0 -  34.0 pg   MCHC 34.4 30.0 - 36.0 g/dL   RDW 12.4 11.5 - 15.5 %   Platelets 333 150 - 400 K/uL   nRBC 0.0 0.0 - 0.2 %   Neutrophils Relative % 65 %   Neutro Abs 5.3 1.7 - 7.7 K/uL   Lymphocytes Relative 26 %   Lymphs Abs 2.2 0.7 - 4.0 K/uL   Monocytes Relative 9 %   Monocytes Absolute 0.8 0.1 - 1.0 K/uL   Eosinophils Relative 0 %   Eosinophils Absolute 0.0 0.0 - 0.5 K/uL   Basophils Relative 0 %   Basophils Absolute 0.0 0.0 - 0.1 K/uL   Immature Granulocytes 0 %   Abs Immature Granulocytes 0.03 0.00 - 0.07 K/uL    Comment: Performed at [facility], [address] [place], [place] [id number]  Basic metabolic panel     Status: Abnormal   Collection Time: 01/17/19  5:17 PM  Result Value Ref Range   Sodium 137 135 - 145 mmol/L   Potassium 4.1 3.5 - 5.1 mmol/L   Chloride 103 98 - 111 mmol/L   CO2 25 22 - 32 mmol/L   Glucose, Bld 110 (H) 70 - 99 mg/dL   BUN 13 8 - 23 mg/dL    Creatinine, Ser 0.79 0.44 - 1.00 mg/dL   Calcium 8.9 8.9 - 10.3 mg/dL   GFR calc non Af Amer >60 >60 mL/min   GFR calc Af Amer >60 >60 mL/min   Anion gap 9 5 - 15    Comment: Performed at [facility], [address] [place], [place] [address]  SARS CORONAVIRUS 2 (TAT 6-24 HRS) Nasopharyngeal Nasopharyngeal Swab     Status: None   Collection Time: 01/17/19  9:00 PM   Specimen: Nasopharyngeal Swab  Result Value Ref Range   SARS Coronavirus 2 NEGATIVE NEGATIVE    Comment: (NOTE) SARS-CoV-2 target nucleic acids are NOT DETECTED. The SARS-CoV-2 RNA is generally detectable in upper and lower respiratory specimens during the acute phase of infection. Negative results do not preclude SARS-CoV-2 infection, do not rule out co-infections with other pathogens, and should not be used as the sole basis for treatment or other patient management decisions. Negative results must be combined with clinical observations, patient history, and epidemiological information. The expected result is Negative. Fact Sheet for Patients: SugarRoll.be Fact Sheet for Healthcare Providers: https://www.woods-mathews.com/ This test is not yet approved or cleared by the [place] FDA and  has been authorized for detection and/or diagnosis of SARS-CoV-2 by FDA under an Emergency Use Authorization (EUA). This EUA will remain  in effect (meaning this test can be used) for the duration of the COVID-19 declaration under Section 56 4(b)(1) of the Act, 21 U.S.C. section 360bbb-3(b)(1), unless the authorization is terminated or revoked sooner. Performed at [facility], [place] [address], [place], [place] [address]   Urinalysis, Routine w reflex microscopic     Status: None   Collection Time: 01/18/19 12:45 PM  Result Value Ref Range   Color, Urine YELLOW YELLOW   APPearance CLEAR CLEAR   Specific Gravity, Urine 1.011 1.005 - 1.030   pH 6.0 5.0 - 8.0   Glucose, UA  NEGATIVE NEGATIVE mg/dL   Hgb urine dipstick NEGATIVE NEGATIVE   Bilirubin Urine NEGATIVE NEGATIVE   Ketones, ur NEGATIVE NEGATIVE mg/dL   Protein, ur NEGATIVE NEGATIVE mg/dL   Nitrite NEGATIVE NEGATIVE   Leukocytes,Ua NEGATIVE NEGATIVE    Comment: Performed at [facility] [address],  [place], [place] [address]    Mr Jeri Cos And Wo Contrast  Result Date: 01/18/2019 CLINICAL DATA:  63 year old female with chronic multiple sclerosis, admitted with progressive weakness and diminished extremity sensation. EXAM: MRI HEAD WITHOUT AND WITH CONTRAST TECHNIQUE: Multiplanar, multiecho pulse sequences of the brain and surrounding structures were obtained without and with intravenous contrast. CONTRAST:  28mL GADAVIST GADOBUTROL 1 MMOL/ML IV SOLN COMPARISON:  Brain MRI 03/16/2017 and earlier. FINDINGS: Brain: No restricted diffusion to suggest acute infarction. No midline shift, mass effect, evidence of mass lesion, ventriculomegaly, extra-axial collection or acute intracranial hemorrhage. Cervicomedullary junction and pituitary are within normal limits. Chronically advanced bilateral periventricular and central white matter T2/FLAIR signal abnormality and volume loss. Relative sparing of the subcortical white matter except in the superior frontal gyri. Compared to 2019 the cerebral white matter disease appears stable. Occasional mild areas of cortical involvement (posterior right superior frontal gyrus and mesial right temporal lobe) appear stable. Bilateral deep white matter capsule involvement appears stable, with otherwise sparing of the deep gray nuclei. The brainstem and cerebellum are also chronically spared. No lesions with restricted diffusion or enhancement are identified. No dural thickening or other abnormal intracranial enhancement. No chronic cerebral blood products identified. Stable cerebral volume. Vascular: Major intracranial vascular flow voids are preserved. The major dural venous  sinuses are enhancing and appear to be patent. Skull and upper cervical spine: Cervical spine reported separately today. Bulky left frontal convexity hyperostosis of the skull or dural calcification is incidentally noted and unchanged. Normal background bone marrow signal. Sinuses/Orbits: Grossly negative orbits. Chronic left maxillary sinus disease, improved. Other paranasal sinuses are clear. Other: Mastoids are clear. Visible internal auditory structures appear normal. Scalp and face soft tissues appear negative. IMPRESSION: 1. Stable since 2019. Advanced chronic demyelinating disease with no progressive or active demyelination identified. 2. No new intracranial abnormality. 3. Cervical spine reported separately today. Electronically Signed   By: Genevie Ann M.D.   On: 01/18/2019 22:44   Mr Cervical Spine W Wo Contrast  Result Date: 01/18/2019 CLINICAL DATA:  63 year old female with chronic multiple sclerosis, admitted with progressive weakness and diminished extremity sensation. EXAM: MRI CERVICAL SPINE WITHOUT AND WITH CONTRAST TECHNIQUE: Multiplanar and multiecho pulse sequences of the cervical spine, to include the craniocervical junction and cervicothoracic junction, were obtained without and with intravenous contrast. CONTRAST:  8mL GADAVIST GADOBUTROL 1 MMOL/ML IV SOLN COMPARISON:  Brain MRI today reported separately. Cervical spine MRI 03/16/2017, 01/14/2016. FINDINGS: Alignment: Progressed degenerative appearing anterolisthesis of C3 on C4 since 2019, now measuring about 3 millimeters. Abnormal posterior elements at that level, see additional details below. Associated increased reversal of the normal cervical lordosis since 2019. Mild degenerative anterolisthesis at C2-C3 has also progressed. Vertebrae: Chronic abnormal marrow signal throughout the C4 vertebral body remain stable compatible with benign etiology. Continued degenerative appearing marrow edema in the right C3-C4 posterior elements, with  associated enhancement. Similar new degenerative appearing marrow edema and enhancement in the left C2-C3 posterior elements. See additional details below. Background bone marrow signal remains within normal limits. Cord: New degenerative spinal stenosis with spinal cord compression at C3-C4. See series 5, image 8. See additional details below. No definite associated abnormal cord signal. Although there does appear to be associated probably degenerative posterior dural thickening and enhancement (series 12, image 8). No abnormal intradural enhancement identified. Superimposed chronic demyelinating related cord signal abnormality redemonstrated in the right hemicord at the C4 level. Similar C5 and C6 level cord signal abnormality best seen on axial images.  These appear stable. Posterior Fossa, vertebral arteries, paraspinal tissues: Brain MRI today reported separately. Preserved major vascular flow voids in the neck. Negative neck soft tissues. Negative visible right lung apex. Disc levels: C2-C3: New degenerative anterolisthesis at this level since 2019 with progressed chronic facet arthropathy, now severe. Left side degenerative facet enhancement and edema. No spinal stenosis. Severe left and mild to moderate right C3 foraminal stenosis appears increased. C3-C4: Progressed degenerative anterolisthesis and facet arthropathy at this level, now severe on the right. Degenerative right facet joint fluid, facet marrow edema and enhancement. Superimposed new bulky posteriorly situated synovial cyst on the right, 9 x 11 millimeters. See series 7, image 9 and series 8, image 7. Associated marginal enhancement of the cyst and adjacent dura on series 12, image 8. Subsequent new moderate spinal stenosis and cord mass effect. Moderate to severe bilateral C4 foraminal stenosis also appears progressed. C4-C5:  Stable mild to moderate facet hypertrophy. C5-C6: Stable disc bulge and endplate spurring without spinal stenosis. Mild  to moderate C6 foraminal stenosis appears stable. C6-C7: Stable disc bulge and endplate spurring with mild ligament flavum hypertrophy. Stable borderline to mild spinal stenosis. Moderate to severe left and moderate right C7 foraminal stenosis is stable. C7-T1: Stable right eccentric disc bulge. Moderate facet hypertrophy. No spinal stenosis, but moderate to severe chronic right C8 foraminal stenosis. Mild left C8 foraminal stenosis. This level stable. Moderate to severe T1 neural foraminal stenosis is chronic and greater on the left, mostly related to facet degeneration. IMPRESSION: 1. Despite the advanced chronic demyelinating disease the symptomatic abnormality appears to be degenerative in nature: Substantially progressed degenerative spondylolisthesis at C2-C3 and C3-C4 since 2019 with severe posterior element degeneration including a bulky right side synovial cyst at C3-C4. Subsequent new moderate spinal stenosis and spinal cord mass effect. See series 5, image 8. No associated cord edema or myelomalacia is evident. 2. Associated acute exacerbation of the chronic facet arthropathy at C2-C3 and C3-C4. Benign chronic abnormal marrow signal in the C4 vertebral body remains stable. 3. Degenerative changes elsewhere in the cervical spine are stable, including moderate and severe lower cervical and upper thoracic neural foraminal stenosis. 4. Underlying chronic cervical cord demyelinating disease appears stable without progression. Study discussed by telephone with NP Blunt on 01/18/2019 at 23:08 . Electronically Signed   By: Genevie Ann M.D.   On: 01/18/2019 23:11    ROS Blood pressure 123/83, pulse 77, temperature 97.9 F (36.6 C), temperature source Oral, resp. rate 20, height 5\' 6"  (1.676 m), weight 45.4 kg, SpO2 97 %. Physical Exam  Constitutional: She appears well-developed and well-nourished. No distress.  HENT:  Head: Normocephalic and atraumatic.  Eyes: Pupils are equal, round, and reactive to light.  EOM are normal.  Cardiovascular: Normal rate and regular rhythm.  Respiratory: Effort normal and breath sounds normal.  GI: Soft. Bowel sounds are normal.  Neurological: She displays abnormal reflex. Coordination abnormal.  Reflex Scores:      Tricep reflexes are 3+ on the right side and 3+ on the left side.      Bicep reflexes are 3+ on the right side and 3+ on the left side.      Brachioradialis reflexes are 3+ on the right side and 3+ on the left side.      Patellar reflexes are 3+ on the right side and 3+ on the left side.      Achilles reflexes are 3+ on the right side and 3+ on the left side. 4-/5 right upper  extremity 4/5 strength left upper extremity  Sustaine Clonus bilateral ankles Severely diminished proprioception at the feet bilaterally Gait not assessed   Skin: Skin is warm and dry.    Assessment/Plan: Faith [name] has spinal cord compression at the C3/4 level, along with a progressive kyphosis centered at C3/4 due to facet hypertrophy and the synovial cyst. My recommendation is that Faith [name] undergo operative decompression at C3/4 via a laminectomy to remove the synovial cyst, then an ACDF for stabilization. Risks and benefits were explained. She understands and wishes to proceed. BP 123/83   Pulse 77   Temp 97.9 F (36.6 C) (Oral)   Resp 20   Ht 5\' 6"  (1.676 m)   Wt 45.4 kg   SpO2 97%   BMI 16.14 kg/m  Faith [name] has decided to undergo an anterior cervical decompression and arthrodesis, and a cervical laminectomy for progressive kyphosis at levels C3/4, and a synovial cyst causing spinal cord compression. Risks and benefits including but not limited to bleeding, infection, paralysis, weakness in one or both extremities, bowel and/or bladder dysfunction, fusion failure, hardware failure, need for further surgery, no relief of pain. Faith [name] understands and wishes to proceed.  Ashok Pall 01/19/2019, 2:24 PM

## 2019-01-19 NOTE — Progress Notes (Addendum)
PROGRESS NOTE    Patient: Faith [name]                            PCP: Doree Albee, MD                    DOB: 07/10/1955            DOA: 01/17/2019 [id number]             DOS: 01/19/2019, 11:45 AM   LOS: 2 days   Date of Service: The patient was seen and examined on 01/19/2019  Subjective:   The patient was seen and examined this morning, remained stable. Reporting improved sensation in her hand not so much on her feet yet.   Continues to have weakness but improving. Still unable to ambulate without assist.  Brief Narrative:   63 y.o. female with medical history significant for Multiple Sclerosis, chronic tobacco abuse, chronic back pain, who is admitted to [facility] on 01/17/2019 with multiple sclerosis exacerbation after presenting from home to [facility] emergency department complaining of progressive symmetrical weakness and diminished sensation involving the hands and feet over the course of the last month.   At [facility] tele-neurology was consulted Dr. Faustino Congress, who recommended Solu-Medrol 1000 mg daily x3-5 days,  01/18/2019-Inpatient neurology consulted following closely continuing steroids  01/19/2019 -patient reporting improvement paresthesia continues to have weakness,  MRI of brain and cervical spine -demyelination, significant degenerative changes at C3-4, bulky right side synovial cyst at C3-C4. - Subsequent new moderate spinal stenosis and spinal cord mass effect - neurosurgery consulted -pending evaluation possible surgical intervention today.   Assessment & Plan:   Principal Problem:   Multiple sclerosis exacerbation ([place]) Active Problems:   Tobacco abuse   Falls frequently   Multiple sclerosis ([facility])   Chronic back pain     Multiple sclerosis exacerbation:  -Showing some improvement with high-dose steroids, Sensation returning to hands> feet,.. Strength improving -Neurology following-appreciate input  -History of multiple  sclerosis diagnosed 3 years ago, on infusion medications-last treatment 2 months ago  - 2 months after development of side-effects to Ocrelizumab infusion, the patient presents with 1 month of progressive -Currently presenting with symmetric weakness, with numbness exclusive to hands and feet bilaterally  -Progressive weakness with difficulty ambulation -associated mechanical falls -Telemetry neurology at [facility], Dr. Faustino Congress recommended  Solu-Medrol 1000 mg IV Qdaily for 3-5 days, with first dose of Solu-Medrol administered at approximately 01/17/2019 >>> anticipating last dose 01/21/19    -MRI of brain and cervical spine -demyelination, significant degenerative changes at C3-4, bulky right side synovial cyst at C3-C4. - Subsequent new moderate spinal stenosis and spinal cord mass effect  -Awaiting further evaluation and review of MRI finding by neurology- anticipating consultation of neurosurgery involvement if needed. -The patient remained stable at this time.  - checking B12 and TSH levels,  - inpatient PT/OT consults.... Recommending CIR, consultation placed  - Solu-Medrol 1000 mg IV daily 3/5 days.     Addendum; Neurosurgery Dr. Christella Noa was consulted, through secure chat --- we appreciate his input in advance Possible surgical intervention today.    Debility/ frequent ground-level mechanical falls: -No traumatic injuries, likely due to above -  fall precaution  -Due to MS flareup, status post PT/OT evaluation -recommend STIR -CIR consulted-remain candidate at discharge     Chronic tobacco abuse: -Patient been consulted on smoking cessation -NicoDerm patch has been provided  Chronic back pain:  -Continue as needed analgesics, on tramadol as an outpatient    DVT prophylaxis: Lovenox 40 mg Excello qdaily.  Code Status: Full Family Communication: None Disposition Plan:  Per Rounding Team Consults called: Dr. Faustino Congress (teleneurologist), as above.  Admission  status: inpatient; med-tele at [facility].   MRI brain/cervical spine IMPRESSION: 1. Despite the advanced chronic demyelinating disease the symptomatic abnormality appears to be degenerative in nature: Substantially progressed degenerative spondylolisthesis at C2-C3 and C3-C4 since 2019 with severe posterior element degeneration including a bulky right side synovial cyst at C3-C4. Subsequent new moderate spinal stenosis and spinal cord mass effect. See series 5, image 8. No associated cord edema or myelomalacia is evident.  2. Associated acute exacerbation of the chronic facet arthropathy at C2-C3 and C3-C4. Benign chronic abnormal marrow signal in the C4 vertebral body remains stable.  3. Degenerative changes elsewhere in the cervical spine are stable, including moderate and severe lower cervical and upper thoracic neural foraminal stenosis.  4. Underlying chronic cervical cord demyelinating disease appears stable without progression.    Procedures:   No admission procedures for hospital encounter.    Antimicrobials:  Anti-infectives (From admission, onward)   None       Medication:  . cholecalciferol  2,000 Units Oral BID  . enoxaparin (LOVENOX) injection  30 mg Subcutaneous Q24H  . gabapentin  600 mg Oral TID  . propranolol  60 mg Oral BID  . sodium chloride flush  3 mL Intravenous Q12H    acetaminophen **OR** acetaminophen, nicotine, traMADol   Objective:   Vitals:   01/18/19 0451 01/18/19 1428 01/18/19 2318 01/19/19 0457  BP: (!) 146/89 (!) 142/92 132/79 123/83  Pulse: 71 79 83 77  Resp: 17 18  20   Temp: 97.7 F (36.5 C) 98.4 F (36.9 C)  97.9 F (36.6 C)  TempSrc: Oral Oral  Oral  SpO2: 94% 95%  97%  Weight:      Height:        Intake/Output Summary (Last 24 hours) at 01/19/2019 1145 Last data filed at 01/19/2019 [id number] Gross per 24 hour  Intake 181 ml  Output 1050 ml  Net -869 ml   Filed Weights   01/17/19 1603  Weight: 45.4 kg      Examination:    Physical Exam  Constitution:  Alert, cooperative, no distress,  Psychiatric: Normal and stable mood and affect, cognition intact,   HEENT: Normocephalic, PERRL, otherwise with in Normal limits  Chest:Chest symmetric Cardio vascular:  S1/S2, RRR, No murmure, No Rubs or Gallops  pulmonary: Clear to auscultation bilaterally, respirations unlabored, negative wheezes / crackles Abdomen: Soft, non-tender, non-distended, bowel sounds,no masses, no organomegaly Muscular skeletal: Limited exam - in bed, able to move all 4 extremities, Normal strength,  Neuro: CNII-XII intact. ,  Improved strength in upper lower extremity 4 out of 5, improved sensation more in the hands, and feet.  Reflexes intact  Extremities: No pitting edema lower extremities, +2 pulses  Skin: Dry, warm to touch, negative for any Rashes, No open wounds Wounds: per nursing documentation     LABs:  CBC Latest Ref Rng & Units 01/17/2019 09/13/2018 12/17/2016  WBC 4.0 - 10.5 K/uL 8.3 8.3 10.3  Hemoglobin 12.0 - 15.0 g/dL 16.4(H) 17.0(H) 14.7  Hematocrit 36.0 - 46.0 % 47.7(H) 50.7(H) 41.8  Platelets 150 - 400 K/uL 333 268 305   CMP Latest Ref Rng & Units 01/17/2019 12/17/2016 06/08/2016  Glucose 70 - 99 mg/dL 110(H) 94 86  BUN 8 -  23 mg/dL 13 8 9   Creatinine 0.44 - 1.00 mg/dL 0.79 0.69 0.76  Sodium 135 - 145 mmol/L 137 137 134(L)  Potassium 3.5 - 5.1 mmol/L 4.1 4.3 4.4  Chloride 98 - 111 mmol/L 103 99 99  CO2 22 - 32 mmol/L 25 29 27   Calcium 8.9 - 10.3 mg/dL 8.9 9.7 9.2  Total Protein 6.1 - 8.1 g/dL - 6.8 6.4  Total Bilirubin 0.2 - 1.2 mg/dL - 0.4 0.2  Alkaline Phos 33 - 130 U/L - - 53  AST 10 - 35 U/L - 15 12  ALT 6 - 29 U/L - 10 10        SIGNED: Deatra James, MD, FACP, FHM. [facility],  Pager [phone]  If 7PM-7AM, please contact night-coverage [email or web address] 01/19/2019, 11:45 AM

## 2019-01-19 NOTE — Progress Notes (Signed)
Physical Therapy Treatment Patient Details Name: Faith [name] MRN: [id number] DOB: 1955/07/27 Today's Date: 01/19/2019    History of Present Illness Pt is a 63 y.o. F admitted for an exacerbation of multiple sclerosis. She has a PMH including but not limited to COPD, back pain, and tobacco use.    PT Comments    Pt demonstrated improvement in functional mobility today with improvement in gait training. She was able to ambulate 35 feet with mod A before fatiguing and stating her "knees were buckling." She continues to demonstrate gait ataxia but less so today, and she did not have any significant episodes of LOB. Assessed LE MMT strength and LE sensation. She has generalized weakness, particularly with R knee extension, B hip flexion, and L knee flexion. She also has diminished sensation over L4, L5, and S1 dermatomes on R compared to L. Pt would benefit from continued skilled PT intervention in order to address deficits.  Follow Up Recommendations  CIR     Equipment Recommendations  None recommended by PT    Recommendations for Other Services OT consult;Rehab consult     Precautions / Restrictions Precautions Precautions: Fall Restrictions Weight Bearing Restrictions: No    Mobility  Bed Mobility Overal bed mobility: Needs Assistance Bed Mobility: Supine to Sit     Supine to sit: Min guard     General bed mobility comments: Pt relies on momentum to fully sit up but does not need any physical assistance  Transfers Overall transfer level: Needs assistance Equipment used: Rolling walker (2 wheeled) Transfers: Sit to/from Stand Sit to Stand: Min assist         General transfer comment: pt able to stand min A with increased time and effort. Assistance was required to keep her steady throughout transfer  Ambulation/Gait Ambulation/Gait assistance: Mod assist;+2 safety/equipment Gait Distance (Feet): 35 Feet Assistive device: Rolling walker (2 wheeled) Gait  Pattern/deviations: Step-to pattern;Decreased step length - right;Decreased step length - left;Shuffle;Ataxic Gait velocity: decr   General Gait Details: gait still ataxic but demonstrated improved sequencing of RW and steps today and was able to ambulate 35 ft with mod A for steadiness and +2 for chair follow   Stairs             Wheelchair Mobility    Modified Rankin (Stroke Patients Only)       Balance Overall balance assessment: Needs assistance Sitting-balance support: Single extremity supported;Feet unsupported;Bilateral upper extremity supported Sitting balance-Leahy Scale: Poor Sitting balance - Comments: Pt demonstrates lack of trunk control and requires use of handrails in order to keep trunk upright during MMT and sensation testing Postural control: Other (comment)(no specific direction of lean, weak overall) Standing balance support: Bilateral upper extremity supported;During functional activity Standing balance-Leahy Scale: Poor Standing balance comment: BUE supported by RW                             Cognition Arousal/Alertness: Awake/alert Behavior During Therapy: WFL for tasks assessed/performed;Anxious Overall Cognitive Status: Within Functional Limits for tasks assessed                                        Exercises General Exercises - Lower Extremity Ankle Circles/Pumps: AROM;Both;10 reps;Seated Quad Sets: Both;10 reps;Seated;Strengthening Gluteal Sets: Strengthening;10 reps;Both;Seated    General Comments        Pertinent Vitals/Pain Pain Assessment: No/denies  pain    Home Living                      Prior Function            PT Goals (current goals can now be found in the care plan section) Acute Rehab PT Goals Patient Stated Goal: to get better and stronger Progress towards PT goals: Progressing toward goals    Frequency    Min 4X/week      PT Plan Current plan remains appropriate     Co-evaluation              AM-PAC PT "6 Clicks" Mobility   Outcome Measure  Help needed turning from your back to your side while in a flat bed without using bedrails?: A Lot Help needed moving from lying on your back to sitting on the side of a flat bed without using bedrails?: A Lot Help needed moving to and from a bed to a chair (including a wheelchair)?: A Lot Help needed standing up from a chair using your arms (e.g., wheelchair or bedside chair)?: A Little Help needed to walk in hospital room?: A Lot Help needed climbing 3-5 steps with a railing? : Total 6 Click Score: 12    End of Session Equipment Utilized During Treatment: Gait belt Activity Tolerance: Patient limited by fatigue Patient left: in chair;with call bell/phone within reach Nurse Communication: Mobility status PT Visit Diagnosis: Unsteadiness on feet (R26.81);Other abnormalities of gait and mobility (R26.89);Muscle weakness (generalized) (M62.81);Other symptoms and signs involving the nervous system (R29.898)     Time: [id number] PT Time Calculation (min) (ACUTE ONLY): 21 min  Charges:  $Gait Training: (P) 8-22 mins                    Silvana Newness, SPT    Nacole Fluhr 01/19/2019, 1:02 PM

## 2019-01-20 DIAGNOSIS — M7130 Other bursal cyst, unspecified site: Secondary | ICD-10-CM

## 2019-01-20 DIAGNOSIS — M4710 Other spondylosis with myelopathy, site unspecified: Secondary | ICD-10-CM

## 2019-01-20 DIAGNOSIS — M4312 Spondylolisthesis, cervical region: Principal | ICD-10-CM

## 2019-01-20 LAB — CBC
HCT: 41.1 % (ref 36.0–46.0)
Hemoglobin: 14.5 g/dL (ref 12.0–15.0)
MCH: 34.3 pg — ABNORMAL HIGH (ref 26.0–34.0)
MCHC: 35.3 g/dL (ref 30.0–36.0)
MCV: 97.2 fL (ref 80.0–100.0)
Platelets: 266 10*3/uL (ref 150–400)
RBC: 4.23 MIL/uL (ref 3.87–5.11)
RDW: 12.3 % (ref 11.5–15.5)
WBC: 20.5 10*3/uL — ABNORMAL HIGH (ref 4.0–10.5)
nRBC: 0 % (ref 0.0–0.2)

## 2019-01-20 LAB — BASIC METABOLIC PANEL
Anion gap: 9 (ref 5–15)
BUN: 13 mg/dL (ref 8–23)
CO2: 25 mmol/L (ref 22–32)
Calcium: 8.7 mg/dL — ABNORMAL LOW (ref 8.9–10.3)
Chloride: 101 mmol/L (ref 98–111)
Creatinine, Ser: 0.55 mg/dL (ref 0.44–1.00)
GFR calc Af Amer: >60 mL/min (ref >60)
GFR calc non Af Amer: >60 mL/min (ref >60)
Glucose, Bld: 129 mg/dL — ABNORMAL HIGH (ref 70–99)
Potassium: 3.5 mmol/L (ref 3.5–5.1)
Sodium: 135 mmol/L (ref 135–145)

## 2019-01-20 NOTE — Progress Notes (Signed)
PROGRESS NOTE    Patient: Faith [name]                            PCP: Doree Albee, MD                    DOB: 1955/03/24            DOA: 01/17/2019 [id number]             DOS: 01/20/2019, 11:20 AM   LOS: 3 days   Date of Service: The patient was seen and examined on 01/20/2019  Subjective:   The patient was seen and examined this morning, post op day 1 following posterior cervical laminectomy and removal of synovial cyst and posterior cervical fusion. She reports have neck and back pain this morning, but says this is currently well controlled. She denies any chest pain, SOB, fever, chills, diarrhea, abdominal pain.   Brief Narrative:   63 y.o. female with medical history significant for Multiple Sclerosis, chronic tobacco abuse, chronic back pain, who is admitted to [facility] on 01/17/2019 with multiple sclerosis exacerbation after presenting from home to [facility] emergency department complaining of progressive symmetrical weakness and diminished sensation involving the hands and feet over the course of the last month.   At [facility] tele-neurology was consulted Dr. Faustino Congress, who recommended Solu-Medrol 1000 mg daily x3-5 days,  01/18/2019-Inpatient neurology consulted following closely continuing steroids  01/19/2019 -patient reporting improvement paresthesia continues to have weakness,  MRI of brain and cervical spine -demyelination, significant degenerative changes at C3-4, bulky right side synovial cyst at C3-C4. - Subsequent new moderate spinal stenosis and spinal cord mass effect - neurosurgery consulted -pending evaluation possible surgical intervention today.  01/20/2019 Post op day 1 following posterior cervical laminectomy and removal of synovial cyst and posterior cervical fusion   Assessment & Plan:   Principal Problem:   Multiple sclerosis exacerbation (HCC) Active Problems:   Tobacco abuse   Falls frequently   Multiple sclerosis (HCC)  Chronic back pain   Spondylolisthesis, cervical region   Spinal cord compression due to degenerative disorder of spinal column     Multiple sclerosis exacerbation:  -Showing some improvement with high-dose steroids, Sensation returning to hands> feet,.. Strength improving -Neurology following-appreciate input  -History of multiple sclerosis diagnosed 3 years ago, on infusion medications-last treatment 2 months ago  - 2 months after development of side-effects to Ocrelizumab infusion, the patient presents with 1 month of progressive -Currently presenting with symmetric weakness, with numbness exclusive to hands and feet bilaterally  -Progressive weakness with difficulty ambulation -associated mechanical falls -Telemetry neurology at [facility], Dr. Faustino Congress recommended  Solu-Medrol 1000 mg IV Qdaily for 3-5 days, with first dose of Solu-Medrol administered at approximately 01/17/2019 >>> anticipating last dose 01/21/19   -MRI of brain and cervical spine -demyelination, significant degenerative changes at C3-4, bulky right side synovial cyst at C3-C4. - Subsequent new moderate spinal stenosis and spinal cord mass effect. Now post op day 1 following posterior cervical laminectomy and removal of synovial cyst and posterior cervical fusion  - B12 and TSH acceptable - inpatient PT/OT consults.  Recommending CIR, consultation placed  - Solu-Medrol 1000 mg IV daily. Today is day 4 of 5.     Spinal cord compression - S/P posterior cervical laminectomy and removal of synovial cyst and posterior cervical fusion - Neurosurgery following and appreciate their assistance - IV morphine available for  post operative pain  Debility/ frequent ground-level mechanical falls: -No traumatic injuries, likely due to above -  fall precaution  -Due to MS flareup, status post PT/OT evaluation -recommend STIR -CIR consulted-remain candidate at discharge    Chronic tobacco abuse: -Patient been consulted on  smoking cessation -NicoDerm patch has been provided  Chronic back pain:  -Continue as needed analgesics, on tramadol as an outpatient    DVT prophylaxis: Lovenox 40 mg [place] qdaily.  Code Status: Full Family Communication: None Disposition Plan:  Likely inpatient rehab Consults called: Dr. Faustino Congress (teleneurologist), Neurosurgery, Dr. Christella Noa Admission status: inpatient; med-tele at [facility].   MRI brain/cervical spine IMPRESSION: 1. Despite the advanced chronic demyelinating disease the symptomatic abnormality appears to be degenerative in nature: Substantially progressed degenerative spondylolisthesis at C2-C3 and C3-C4 since 2019 with severe posterior element degeneration including a bulky right side synovial cyst at C3-C4. Subsequent new moderate spinal stenosis and spinal cord mass effect. See series 5, image 8. No associated cord edema or myelomalacia is evident.  2. Associated acute exacerbation of the chronic facet arthropathy at C2-C3 and C3-C4. Benign chronic abnormal marrow signal in the C4 vertebral body remains stable.  3. Degenerative changes elsewhere in the cervical spine are stable, including moderate and severe lower cervical and upper thoracic neural foraminal stenosis.  4. Underlying chronic cervical cord demyelinating disease appears stable without progression.    Procedures:   No admission procedures for hospital encounter.    Antimicrobials:  Anti-infectives (From admission, onward)   Start     Dose/Rate Route Frequency Ordered Stop   01/19/19 1707  vancomycin (VANCOCIN) 1-5 GM/200ML-% IVPB    Note to Pharmacy: Maude Leriche   : cabinet override      01/19/19 1707 01/20/19 0514       Medication:  . celecoxib  200 mg Oral Q12H  . cholecalciferol  2,000 Units Oral BID  . gabapentin  600 mg Oral TID  . propranolol  60 mg Oral BID  . sodium chloride flush  3 mL Intravenous Q12H  . sodium chloride flush  3 mL Intravenous Q12H     acetaminophen **OR** acetaminophen, diazepam, menthol-cetylpyridinium **OR** phenol, morphine injection, nicotine, ondansetron **OR** ondansetron (ZOFRAN) IV, oxyCODONE, sodium chloride flush, traMADol   Objective:   Vitals:   01/19/19 2254 01/20/19 0250 01/20/19 0511 01/20/19 0914  BP: (!) 163/99 (!) 162/93 (!) 158/98 (!) 162/97  Pulse: 68 69 67 70  Resp:  16 18 12   Temp: (!) 97.4 F (36.3 C) (!) 97.5 F (36.4 C) 98 F (36.7 C) 98 F (36.7 C)  TempSrc: Oral Oral Oral Oral  SpO2: 100% 95% 97% 99%  Weight:      Height:        Intake/Output Summary (Last 24 hours) at 01/20/2019 1120 Last data filed at 01/20/2019 [id number] Gross per 24 hour  Intake 2315.02 ml  Output 2150 ml  Net 165.02 ml   Filed Weights   01/17/19 1603 01/19/19 1627  Weight: 45.4 kg 45.4 kg     Examination:    Physical Exam  Constitution:  Alert, cooperative, no distress,  Psychiatric: Normal and stable mood and affect, cognition intact,   HEENT: Normocephalic, PERRL, otherwise with in Normal limits  Chest:Chest symmetric Cardio vascular:  S1/S2, RRR, No murmure, No Rubs or Gallops  pulmonary: Clear to auscultation bilaterally, respirations unlabored, negative wheezes / crackles Abdomen: Soft, non-tender, non-distended, bowel sounds,no masses, no organomegaly Muscular skeletal: Limited exam - in bed, able to move all 4 extremities,  Normal muscle tone Neuro: CNII-XII grossly intact. ,  Strength in upper and lower extremity 4 out of 5, improved sensation more in the hands, and feet.  Reflexes intact  Extremities: No pitting edema lower extremities, +2 pulses  Skin: Dry, warm to touch, negative for any Rashes, No open wounds, incision noted mid neck Wounds: per nursing documentation     LABs:  CBC Latest Ref Rng & Units 01/20/2019 01/17/2019 09/13/2018  WBC 4.0 - 10.5 K/uL 20.5(H) 8.3 8.3  Hemoglobin 12.0 - 15.0 g/dL 14.5 16.4(H) 17.0(H)  Hematocrit 36.0 - 46.0 % 41.1 47.7(H) 50.7(H)  Platelets 150 -  400 K/uL 266 333 268   CMP Latest Ref Rng & Units 01/20/2019 01/17/2019 12/17/2016  Glucose 70 - 99 mg/dL 129(H) 110(H) 94  BUN 8 - 23 mg/dL 13 13 8   Creatinine 0.44 - 1.00 mg/dL 0.55 0.79 0.69  Sodium 135 - 145 mmol/L 135 137 137  Potassium 3.5 - 5.1 mmol/L 3.5 4.1 4.3  Chloride 98 - 111 mmol/L 101 103 99  CO2 22 - 32 mmol/L 25 25 29   Calcium 8.9 - 10.3 mg/dL 8.7(L) 8.9 9.7  Total Protein 6.1 - 8.1 g/dL - - 6.8  Total Bilirubin 0.2 - 1.2 mg/dL - - 0.4  Alkaline Phos 33 - 130 U/L - - -  AST 10 - 35 U/L - - 15  ALT 6 - 29 U/L - - 10        SIGNED: Arlan Organ, DO [facility]

## 2019-01-20 NOTE — Progress Notes (Signed)
Physical Therapy Treatment Patient Details Name: Faith [name] MRN: [id number] DOB: 01-05-1956 Today's Date: 01/20/2019    History of Present Illness Pt is a 63 y.o. F admitted for an exacerbation of multiple sclerosis. She has a PMH including but not limited to COPD, back pain, and tobacco use. Pt now s/p POSTERIOR CERVICAL LAMINECTOMY Removal of Synovial Cyst and posterior cervical fusion and ANTERIOR CERVICAL DECOMPRESSION/DISCECTOMY Cervical three - four.    PT Comments    Pt making minimal progress with functional mobility. She remains greatly limited secondary to weakness (R LE more so than L), poor balance and coordination deficits as well as new pain secondary to her cervical surgery on 11/20. She continues to require mod A for transfers and was unable to attempt ambulation this session secondary to significant ataxia and great difficulty advancing R LE forwards. Continue to recommend pt d/c to CIR for further intensive therapies to maximize her independence with functional mobility prior to returning home with family support. Pt would continue to benefit from skilled physical therapy services at this time while admitted and after d/c to address the below listed limitations in order to improve overall safety and independence with functional mobility.    Follow Up Recommendations  CIR     Equipment Recommendations  None recommended by PT    Recommendations for Other Services       Precautions / Restrictions Precautions Precautions: Fall;Cervical Precaution Comments: reviewed log roll technique with pt Restrictions Weight Bearing Restrictions: No    Mobility  Bed Mobility Overal bed mobility: Needs Assistance Bed Mobility: Rolling;Sidelying to Sit Rolling: Min guard Sidelying to sit: Min guard       General bed mobility comments: min guard for safety, cueing for log roll technique to protect her cervical spine  Transfers Overall transfer level: Needs  assistance Equipment used: Rolling walker (2 wheeled) Transfers: Sit to/from [facility] Sit to Stand: Mod assist Stand pivot transfers: Mod assist       General transfer comment: good technique, assistance needed to power into standing and for transitional movement from bed to [facility] and then to recliner chair; pt with great difficulty advancing R LE forwards and very ataxic with movement  Ambulation/Gait             General Gait Details: attempted ambulation; however, unsafe as pt was very ataxic and unsteady, required mod A to take pivotal side steps to recliner chair   Stairs             Wheelchair Mobility    Modified Rankin (Stroke Patients Only)       Balance Overall balance assessment: Needs assistance Sitting-balance support: Feet supported;Single extremity supported;Bilateral upper extremity supported Sitting balance-Leahy Scale: Poor     Standing balance support: Bilateral upper extremity supported;During functional activity Standing balance-Leahy Scale: Poor                              Cognition Arousal/Alertness: Awake/alert Behavior During Therapy: WFL for tasks assessed/performed;Anxious Overall Cognitive Status: Within Functional Limits for tasks assessed                                        Exercises      General Comments        Pertinent Vitals/Pain Pain Assessment: Faces Faces Pain Scale: Hurts a little bit Pain Location:  neck Pain Descriptors / Indicators: Sore Pain Intervention(s): Monitored during session;Repositioned    Home Living                      Prior Function            PT Goals (current goals can now be found in the care plan section) Acute Rehab PT Goals PT Goal Formulation: With patient Time For Goal Achievement: 02/01/19 Potential to Achieve Goals: Good Progress towards PT goals: Progressing toward goals    Frequency    Min 4X/week      PT  Plan Current plan remains appropriate    Co-evaluation              AM-PAC PT "6 Clicks" Mobility   Outcome Measure  Help needed turning from your back to your side while in a flat bed without using bedrails?: None Help needed moving from lying on your back to sitting on the side of a flat bed without using bedrails?: A Little Help needed moving to and from a bed to a chair (including a wheelchair)?: A Lot Help needed standing up from a chair using your arms (e.g., wheelchair or bedside chair)?: A Lot Help needed to walk in hospital room?: A Lot Help needed climbing 3-5 steps with a railing? : Total 6 Click Score: 14    End of Session Equipment Utilized During Treatment: Gait belt Activity Tolerance: Patient limited by fatigue;Patient limited by pain Patient left: in chair;with call bell/phone within reach Nurse Communication: Mobility status PT Visit Diagnosis: Other abnormalities of gait and mobility (R26.89);Other symptoms and signs involving the nervous system (R29.898)     Time: 1332-1400 PT Time Calculation (min) (ACUTE ONLY): 28 min  Charges:  $Therapeutic Activity: 23-37 mins                     Anastasio Champion, DPT  Acute Rehabilitation Services Pager [phone] Office [place] 01/20/2019, 3:15 PM

## 2019-01-20 NOTE — Plan of Care (Signed)
  Problem: Health Behavior/Discharge Planning: Goal: Ability to manage health-related needs will improve Outcome: Progressing   

## 2019-01-20 NOTE — Anesthesia Postprocedure Evaluation (Signed)
Anesthesia Post Note  Patient: Kassi A Heacox  Procedure(s) Performed: POSTERIOR CERVICAL LAMINECTOMY Removal of Synovial Cyst and posterior cervical fusion (N/A ) ANTERIOR CERVICAL DECOMPRESSION/DISCECTOMY Cervical three - four (N/A Neck)     Patient location during evaluation: PACU Anesthesia Type: General Level of consciousness: awake and alert Pain management: pain level controlled Vital Signs Assessment: post-procedure vital signs reviewed and stable Respiratory status: spontaneous breathing, nonlabored ventilation, respiratory function stable and patient connected to nasal cannula oxygen Cardiovascular status: blood pressure returned to baseline and stable Postop Assessment: no apparent nausea or vomiting Anesthetic complications: no    Last Vitals:  Vitals:   01/20/19 0250 01/20/19 0511  BP: (!) 162/93 (!) 158/98  Pulse: 69 67  Resp: 16 18  Temp: (!) 36.4 C 36.7 C  SpO2: 95% 97%    Last Pain:  Vitals:   01/20/19 0623  TempSrc:   PainSc: 8                  Ryan P Ellender

## 2019-01-20 NOTE — Progress Notes (Signed)
Patient ID: Faith [name], female   DOB: Sep 05, 1955, 63 y.o.   MRN: [id number] BP (!) 147/107 (BP Location: Left Arm)   Pulse 71   Temp 98.6 F (37 C) (Oral)   Resp 14   Ht 5\' 6"  (1.676 m)   Wt 45.4 kg   SpO2 96%   BMI 16.14 kg/m  Alert and oriented x 4, speech is clear and fluent Moving all extremities well Feels her hands working better than preop Wounds are clean and dry no signs of infection Continue with pt, ot

## 2019-01-20 NOTE — Progress Notes (Addendum)
NEURO HOSPITALIST PROGRESS NOTE   Subjective: Patient awake, alert, NAD. C/o neck pain.  Exam: Vitals:   01/20/19 0511 01/20/19 0914  BP: (!) 158/98 (!) 162/97  Pulse: 67 70  Resp: 18 12  Temp: 98 F (36.7 C) 98 F (36.7 C)  SpO2: 97% 99%    Physical Exam  Constitutional: Appears well-developed and well-nourished.  Psych: Affect appropriate to situation Eyes: Normal external eye and conjunctiva. HENT: Normocephalic, no lesions, without obvious abnormality.   Musculoskeletal-no joint tenderness, deformity or swelling Cardiovascular: Normal rate and regular rhythm.  Respiratory: Effort normal, non-labored breathing saturations WNL GI: Soft.  No distension. There is no tenderness.  Skin: WDI  Neuro:  Mental Status: Alert, oriented, thought content appropriate.  Speech fluent without evidence of aphasia.  Able to follow  commands without difficulty. Cranial Nerves: [id number] fields grossly normal,  III,IV, VI: ptosis not present, extra-ocular motions intact bilaterally pupils equal, round, reactive to light and accommodation V,VII: smile symmetric, facial light touch sensation normal bilaterally VIII: hearing normal bilaterally IX,X: uvula rises symmetrically XI: bilateral shoulder shrug XII: midline tongue extension Motor:     Right  Left Shoulder Abduction  4/5  4/5 Elbow Flexion   3/5  4/5 Elbow Extension  3/5  4/5 Hand grips       4/5  4/5 Right hand grip is weaker than left hand grip  Hip Flexion   4/5  4/5 Knee Flexion   3/5  3/5 Knee Extension  4/5  4/5 Ankle Dorsiflexion  4/5  4/5 Ankle Plantarflexion  4/5  4/5  Sensory:  light touch intact throughout, bilaterally Deep Tendon Reflexes: 2+ and symmetric biceps, patella Cerebellar: Unable to perform HTS, ataxia noted with FNF Gait: deferred    Medications:  Scheduled: . celecoxib  200 mg Oral Q12H  . cholecalciferol  2,000 Units Oral BID  . gabapentin  600 mg Oral TID  .  propranolol  60 mg Oral BID  . sodium chloride flush  3 mL Intravenous Q12H  . sodium chloride flush  3 mL Intravenous Q12H   Continuous: . sodium chloride    . 0.9 % NaCl with KCl 20 mEq / L 80 mL/hr at 01/20/19 0600  . methylPREDNISolone (SOLU-MEDROL) injection Stopped (01/18/19 1919)   [id number] **OR** acetaminophen, diazepam, menthol-cetylpyridinium **OR** phenol, morphine injection, nicotine, ondansetron **OR** ondansetron (ZOFRAN) IV, oxyCODONE, sodium chloride flush, traMADol  Pertinent Labs/Diagnostics:  BG: 129  Dg Cervical Spine 1 View  Result Date: 01/19/2019 CLINICAL DATA:  Posterior cervical laminectomy, removal of synovial cyst and posterior cervical fusion EXAM: DG CERVICAL SPINE - 1 VIEW; DG C-ARM 1-60 MIN COMPARISON:  01/18/2019 lumbar spine MRI FLUOROSCOPY TIME:  Fluoroscopy Time:  0 minutes 14 seconds Number of Acquired Spot Images: 1 FINDINGS: Nondiagnostic single spot fluoroscopic lateral intraoperative cervical spine radiograph demonstrates postsurgical changes from bilateral posterior spinal fusion at C3-4. IMPRESSION: Intraoperative fluoroscopic guidance for posterior spinal fusion. Electronically Signed   By: Ilona Sorrel M.D.   On: 01/19/2019 20:59   Dg Cervical Spine Complete  Result Date: 01/19/2019 CLINICAL DATA:  ACDF C3-C4 EXAM: CERVICAL SPINE - COMPLETE 4+ VIEW COMPARISON:  Procedural fluoroscopy. FINDINGS: Multiple cross-table lateral spot views obtained in the operating room. Patient has prior C3-C4 posterior fusion. Images obtained surgical instruments localizing anteriorly. Subsequent anterior C3-C4 fusion. IMPRESSION: Cross-table lateral spot views obtained in the operating room during surgical localization and  anterior C3-C4 fusion. Electronically Signed   By: Keith Rake M.D.   On: 01/19/2019 23:24   Mr Jeri Cos And Wo Contrast  Result Date: 01/18/2019 CLINICAL DATA:  63 year old female with chronic multiple sclerosis, admitted with  progressive weakness and diminished extremity sensation. EXAM: MRI HEAD WITHOUT AND WITH CONTRAST TECHNIQUE: Multiplanar, multiecho pulse sequences of the brain and surrounding structures were obtained without and with intravenous contrast. CONTRAST:  69mL GADAVIST GADOBUTROL 1 MMOL/ML IV SOLN COMPARISON:  Brain MRI 03/16/2017 and earlier. FINDINGS: Brain: No restricted diffusion to suggest acute infarction. No midline shift, mass effect, evidence of mass lesion, ventriculomegaly, extra-axial collection or acute intracranial hemorrhage. Cervicomedullary junction and pituitary are within normal limits. Chronically advanced bilateral periventricular and central white matter T2/FLAIR signal abnormality and volume loss. Relative sparing of the subcortical white matter except in the superior frontal gyri. Compared to 2019 the cerebral white matter disease appears stable. Occasional mild areas of cortical involvement (posterior right superior frontal gyrus and mesial right temporal lobe) appear stable. Bilateral deep white matter capsule involvement appears stable, with otherwise sparing of the deep gray nuclei. The brainstem and cerebellum are also chronically spared. No lesions with restricted diffusion or enhancement are identified. No dural thickening or other abnormal intracranial enhancement. No chronic cerebral blood products identified. Stable cerebral volume. Vascular: Major intracranial vascular flow voids are preserved. The major dural venous sinuses are enhancing and appear to be patent. Skull and upper cervical spine: Cervical spine reported separately today. Bulky left frontal convexity hyperostosis of the skull or dural calcification is incidentally noted and unchanged. Normal background bone marrow signal. Sinuses/Orbits: Grossly negative orbits. Chronic left maxillary sinus disease, improved. Other paranasal sinuses are clear. Other: Mastoids are clear. Visible internal auditory structures appear normal.  Scalp and face soft tissues appear negative. IMPRESSION: 1. Stable since 2019. Advanced chronic demyelinating disease with no progressive or active demyelination identified. 2. No new intracranial abnormality. 3. Cervical spine reported separately today. Electronically Signed   By: Genevie Ann M.D.   On: 01/18/2019 22:44   Mr Cervical Spine W Wo Contrast  Result Date: 01/18/2019 CLINICAL DATA:  63 year old female with chronic multiple sclerosis, admitted with progressive weakness and diminished extremity sensation. EXAM: MRI CERVICAL SPINE WITHOUT AND WITH CONTRAST TECHNIQUE: Multiplanar and multiecho pulse sequences of the cervical spine, to include the craniocervical junction and cervicothoracic junction, were obtained without and with intravenous contrast. CONTRAST:  23mL GADAVIST GADOBUTROL 1 MMOL/ML IV SOLN COMPARISON:  Brain MRI today reported separately. Cervical spine MRI 03/16/2017, 01/14/2016. FINDINGS: Alignment: Progressed degenerative appearing anterolisthesis of C3 on C4 since 2019, now measuring about 3 millimeters. Abnormal posterior elements at that level, see additional details below. Associated increased reversal of the normal cervical lordosis since 2019. Mild degenerative anterolisthesis at C2-C3 has also progressed. Vertebrae: Chronic abnormal marrow signal throughout the C4 vertebral body remain stable compatible with benign etiology. Continued degenerative appearing marrow edema in the right C3-C4 posterior elements, with associated enhancement. Similar new degenerative appearing marrow edema and enhancement in the left C2-C3 posterior elements. See additional details below. Background bone marrow signal remains within normal limits. Cord: New degenerative spinal stenosis with spinal cord compression at C3-C4. See series 5, image 8. See additional details below. No definite associated abnormal cord signal. Although there does appear to be associated probably degenerative posterior dural  thickening and enhancement (series 12, image 8). No abnormal intradural enhancement identified. Superimposed chronic demyelinating related cord signal abnormality redemonstrated in the right hemicord at the C4  level. Similar C5 and C6 level cord signal abnormality best seen on axial images. These appear stable. Posterior Fossa, vertebral arteries, paraspinal tissues: Brain MRI today reported separately. Preserved major vascular flow voids in the neck. Negative neck soft tissues. Negative visible right lung apex. Disc levels: C2-C3: New degenerative anterolisthesis at this level since 2019 with progressed chronic facet arthropathy, now severe. Left side degenerative facet enhancement and edema. No spinal stenosis. Severe left and mild to moderate right C3 foraminal stenosis appears increased. C3-C4: Progressed degenerative anterolisthesis and facet arthropathy at this level, now severe on the right. Degenerative right facet joint fluid, facet marrow edema and enhancement. Superimposed new bulky posteriorly situated synovial cyst on the right, 9 x 11 millimeters. See series 7, image 9 and series 8, image 7. Associated marginal enhancement of the cyst and adjacent dura on series 12, image 8. Subsequent new moderate spinal stenosis and cord mass effect. Moderate to severe bilateral C4 foraminal stenosis also appears progressed. C4-C5:  Stable mild to moderate facet hypertrophy. C5-C6: Stable disc bulge and endplate spurring without spinal stenosis. Mild to moderate C6 foraminal stenosis appears stable. C6-C7: Stable disc bulge and endplate spurring with mild ligament flavum hypertrophy. Stable borderline to mild spinal stenosis. Moderate to severe left and moderate right C7 foraminal stenosis is stable. C7-T1: Stable right eccentric disc bulge. Moderate facet hypertrophy. No spinal stenosis, but moderate to severe chronic right C8 foraminal stenosis. Mild left C8 foraminal stenosis. This level stable. Moderate to severe  T1 neural foraminal stenosis is chronic and greater on the left, mostly related to facet degeneration. IMPRESSION: 1. Despite the advanced chronic demyelinating disease the symptomatic abnormality appears to be degenerative in nature: Substantially progressed degenerative spondylolisthesis at C2-C3 and C3-C4 since 2019 with severe posterior element degeneration including a bulky right side synovial cyst at C3-C4. Subsequent new moderate spinal stenosis and spinal cord mass effect. See series 5, image 8. No associated cord edema or myelomalacia is evident. 2. Associated acute exacerbation of the chronic facet arthropathy at C2-C3 and C3-C4. Benign chronic abnormal marrow signal in the C4 vertebral body remains stable. 3. Degenerative changes elsewhere in the cervical spine are stable, including moderate and severe lower cervical and upper thoracic neural foraminal stenosis. 4. Underlying chronic cervical cord demyelinating disease appears stable without progression. Study discussed by telephone with NP Blunt on 01/18/2019 at 23:08 . Electronically Signed   By: Genevie Ann M.D.   On: 01/18/2019 23:11   Dg C-arm 1-60 Min  Result Date: 01/19/2019 CLINICAL DATA:  Posterior cervical laminectomy, removal of synovial cyst and posterior cervical fusion EXAM: DG CERVICAL SPINE - 1 VIEW; DG C-ARM 1-60 MIN COMPARISON:  01/18/2019 lumbar spine MRI FLUOROSCOPY TIME:  Fluoroscopy Time:  0 minutes 14 seconds Number of Acquired Spot Images: 1 FINDINGS: Nondiagnostic single spot fluoroscopic lateral intraoperative cervical spine radiograph demonstrates postsurgical changes from bilateral posterior spinal fusion at C3-4. IMPRESSION: Intraoperative fluoroscopic guidance for posterior spinal fusion. Electronically Signed   By: Ilona Sorrel M.D.   On: 01/19/2019 20:59   Assessment: 63 year old female with presenting symptoms most consistent with MS exacerbation. Has a diagnosis of PPMS. Cervical spine imaging revealed that the most  likely etiology for her diffuse weakness was cervical spinal cord compression by a bulky right sided synovial cyst at C3-C4. She is POD #1 after posterior cervical laminectomy with synovial cyst resection. 1. Exam on initial consultation revealed weakness in all 4 extremities, worse in BLE, increased tone and hyperreflexia, as well as  appendicular ataxia. She continues to have diffuse weakness on follow up Neurological exam today.  2. Has been off of disease modifying therapy as an outpatient due to JC virus positivity (Tysabri) and unacceptable side effect of severe headaches (Ocrevus).  3. States that IV steroids have helped in the past. D/C IV steroids today as this is unlikely an MS exacerbation.  Recommendations: -- D/C steroid IV Solumedrol as etiology for her presentation with generalized weakness was most likely cord compression and not MS exacerbation. -- Will need close outpatient follow up with Dr. Merlene Laughter for reinitiation of an alternate disease modifying agent for management of her PPMS (unable to treat with Tysabri or Ocrevus).  - Neurology to sign off at this time, please call with any further questions or concerns.   Laurey Morale, MSN, NP-C [facility] [phone]   Electronically signed: Dr. Kerney Elbe 01/20/2019, 11:42 AM

## 2019-01-21 LAB — CBC
HCT: 43 % (ref 36.0–46.0)
Hemoglobin: 14.6 g/dL (ref 12.0–15.0)
MCH: 33.7 pg (ref 26.0–34.0)
MCHC: 34 g/dL (ref 30.0–36.0)
MCV: 99.3 fL (ref 80.0–100.0)
Platelets: 239 10*3/uL (ref 150–400)
RBC: 4.33 MIL/uL (ref 3.87–5.11)
RDW: 12.3 % (ref 11.5–15.5)
WBC: 10.5 10*3/uL (ref 4.0–10.5)
nRBC: 0 % (ref 0.0–0.2)

## 2019-01-21 LAB — BASIC METABOLIC PANEL
Anion gap: 8 (ref 5–15)
BUN: 9 mg/dL (ref 8–23)
CO2: 28 mmol/L (ref 22–32)
Calcium: 8.6 mg/dL — ABNORMAL LOW (ref 8.9–10.3)
Chloride: 104 mmol/L (ref 98–111)
Creatinine, Ser: 0.54 mg/dL (ref 0.44–1.00)
GFR calc Af Amer: >60 mL/min (ref >60)
GFR calc non Af Amer: >60 mL/min (ref >60)
Glucose, Bld: 88 mg/dL (ref 70–99)
Potassium: 3.2 mmol/L — ABNORMAL LOW (ref 3.5–5.1)
Sodium: 140 mmol/L (ref 135–145)

## 2019-01-21 MED ORDER — POTASSIUM CHLORIDE CRYS ER 20 MEQ PO TBCR
40.0000 meq | EXTENDED_RELEASE_TABLET | Freq: Two times a day (BID) | ORAL | Status: AC
  Administered 2019-01-21 (×2): 40 meq via ORAL
  Filled 2019-01-21 (×2): qty 2

## 2019-01-21 NOTE — Progress Notes (Signed)
Patient ID: Faith [name], female   DOB: [birth year], 63 y.o.   MRN: [id number] BP (!) 159/95 (BP Location: Left Arm)   Pulse 68   Temp (!) 97.5 F (36.4 C) (Oral)   Resp 16   Ht 5\' 6"  (1.676 m)   Wt 45.4 kg   SpO2 100%   BMI 16.14 kg/m  Alert and oriented x 4 Voice is strong Moving all extremities Wounds are clean, dry,  No signs of infection

## 2019-01-21 NOTE — Progress Notes (Addendum)
PROGRESS NOTE    Patient: Faith [name]                            PCP: Doree Albee, MD                    DOB: June 25, 1955            DOA: 01/17/2019 [id number]             DOS: 01/21/2019, 11:17 AM   LOS: 4 days   Date of Service: The patient was seen and examined on 01/21/2019  Subjective:   The patient was seen and examined this morning, post op day 2 following posterior cervical laminectomy and removal of synovial cyst and posterior cervical fusion. She reports the numbness in her hands has improved and she was able to walk some with PT yesterday. She is willing to have inpatient rehab should this be needed. She denies any chest pain, SOB, fever, chills, diarrhea, abdominal pain.   Brief Narrative:   63 y.o. female with medical history significant for Multiple Sclerosis, chronic tobacco abuse, chronic back pain, who is admitted to [facility] on 01/17/2019 with multiple sclerosis exacerbation after presenting from home to [facility] emergency department complaining of progressive symmetrical weakness and diminished sensation involving the hands and feet over the course of the last month.   At [facility] tele-neurology was consulted Dr. Faustino Congress, who recommended Solu-Medrol 1000 mg daily x3-5 days,  01/18/2019-Inpatient neurology consulted following closely continuing steroids  01/19/2019 -patient reporting improvement paresthesia continues to have weakness,  MRI of brain and cervical spine -demyelination, significant degenerative changes at C3-4, bulky right side synovial cyst at C3-C4. - Subsequent new moderate spinal stenosis and spinal cord mass effect - neurosurgery consulted -pending evaluation possible surgical intervention today.  01/20/2019 Post op day 1 following posterior cervical laminectomy and removal of synovial cyst and posterior cervical fusion. Neurology stopped solumedrol and feel her symptoms are related to compression rather than MS.    Assessment & Plan:   Principal Problem:   Multiple sclerosis exacerbation ([place]) Active Problems:   Tobacco abuse   Falls frequently   Multiple sclerosis ([facility])   Chronic back pain   Spondylolisthesis, cervical region   Spinal cord compression due to degenerative disorder of spinal column   Synovial cyst     Multiple sclerosis exacerbation, ruled out:  -Showing some improvement with high-dose steroids, Sensation returning to hands> feet,.. Strength improving -Neurology following-appreciate input  -History of multiple sclerosis diagnosed 3 years ago, on infusion medications-last treatment 2 months ago  - 2 months after development of side-effects to Ocrelizumab infusion, the patient presents with 1 month of progressive -Currently presenting with symmetric weakness, with numbness exclusive to hands and feet bilaterally  -Progressive weakness with difficulty ambulation -associated mechanical falls -Telemetry neurology at [facility], Dr. Faustino Congress recommended  Solu-Medrol 1000 mg IV Qdaily for 3-5 days, with first dose of Solu-Medrol administered at approximately 01/17/2019 >>>last dose 01/20/2019   -MRI of brain and cervical spine -demyelination, significant degenerative changes at C3-4, bulky right side synovial cyst at C3-C4. - Subsequent new moderate spinal stenosis and spinal cord mass effect. Now post op day 1 following posterior cervical laminectomy and removal of synovial cyst and posterior cervical fusion  - B12 and TSH acceptable - inpatient PT/OT consults.  Recommending CIR, consultation placed  - Solu-Medrol 1000 mg IV has been discontinued as patient is  not felt to have an MS exacerbation with her symptoms most likely secondary to her spinal cord compression treated below  Spinal cord compression - S/P posterior cervical laminectomy and removal of synovial cyst and posterior cervical fusion - Neurosurgery following and appreciate their assistance - IV morphine available for  post operative pain  Debility/ frequent ground-level mechanical falls: -No traumatic injuries, likely due to above -  fall precaution  -Due to MS flareup, status post PT/OT evaluation -recommend STIR -CIR consulted-remain candidate at discharge -Social work consulted for possible inpatient rehab   Chronic tobacco abuse: -Patient been consulted on smoking cessation -NicoDerm patch has been provided  Chronic back pain:  -Continue as needed analgesics, on tramadol as an outpatient  Hypokalemia: -3.2 today -Giving 40 meq BID for 2 doses -Repeat BMP in AM  DVT prophylaxis: Lovenox 40 mg [place] qdaily.  Code Status: Full Family Communication: None Disposition Plan:  Likely inpatient rehab Consults called: Dr. Faustino Congress (teleneurologist), Neurosurgery, Dr. Christella Noa Admission status: inpatient; med[facility]tele at [facility].   MRI brain/cervical spine IMPRESSION: 1. Despite the advanced chronic demyelinating disease the symptomatic abnormality appears to be degenerative in nature: Substantially progressed degenerative spondylolisthesis at C2-C3 and C3-C4 since 2019 with severe posterior element degeneration including a bulky right side synovial cyst at C3-C4. Subsequent new moderate spinal stenosis and spinal cord mass effect. See series 5, image 8. No associated cord edema or myelomalacia is evident.  2. Associated acute exacerbation of the chronic facet arthropathy at C2-C3 and C3-C4. Benign chronic abnormal marrow signal in the C4 vertebral body remains stable.  3. Degenerative changes elsewhere in the cervical spine are stable, including moderate and severe lower cervical and upper thoracic neural foraminal stenosis.  4. Underlying chronic cervical cord demyelinating disease appears stable without progression.    Procedures:   No admission procedures for hospital encounter.    Antimicrobials:  Anti-infectives (From admission, onward)   Start     Dose/Rate Route Frequency  Ordered Stop   01/19/19 1707  vancomycin (VANCOCIN) 1-5 GM/200ML-% IVPB    Note to Pharmacy: Maude Leriche   : cabinet override      01/19/19 1707 01/20/19 0514       Medication:  . celecoxib  200 mg Oral Q12H  . cholecalciferol  2,000 Units Oral BID  . gabapentin  600 mg Oral TID  . propranolol  60 mg Oral BID  . sodium chloride flush  3 mL Intravenous Q12H  . sodium chloride flush  3 mL Intravenous Q12H    acetaminophen **OR** acetaminophen, diazepam, menthol-cetylpyridinium **OR** phenol, morphine injection, nicotine, ondansetron **OR** ondansetron (ZOFRAN) IV, oxyCODONE, sodium chloride flush, traMADol   Objective:   Vitals:   01/20/19 1747 01/20/19 2054 01/21/19 0515 01/21/19 0800  BP: (!) 153/87 (!) 158/92 (!) 145/88 (!) 159/95  Pulse: 60 64 64 68  Resp: 14 17 18 16   Temp: 98.2 F (36.8 C) 98.4 F (36.9 C) 98.4 F (36.9 C) (!) 97.5 F (36.4 C)  TempSrc: Oral Oral Oral Oral  SpO2: 98% 99% 99% 100%  Weight:      Height:        Intake/Output Summary (Last 24 hours) at 01/21/2019 1117 Last data filed at 01/21/2019 1114 Gross per 24 hour  Intake 240 ml  Output 3150 ml  Net -2910 ml   Filed Weights   01/17/19 1603 01/19/19 1627  Weight: 45.4 kg 45.4 kg     Examination:    Physical Exam  Constitution:  Alert, cooperative, no distress,  Psychiatric: Normal and stable mood and affect, cognition intact,   HEENT: Normocephalic, PERRL, otherwise with in Normal limits  Chest:Chest symmetric Cardio vascular:  S1/S2, RRR, No murmure, No Rubs or Gallops  pulmonary: Clear to auscultation bilaterally, respirations unlabored, negative wheezes / crackles Abdomen: Soft, non-tender, non-distended, bowel sounds,no masses, no organomegaly Muscular skeletal: Limited exam - in bed, able to move all 4 extremities, Normal muscle tone Neuro: CNII-XII grossly intact. ,  Strength in upper and lower extremity 4 out of 5, improved sensation more in the hands, and feet.  Reflexes  intact  Extremities: No pitting edema lower extremities, +2 pulses  Skin: Dry, warm to touch, negative for any Rashes, No open wounds, incision noted mid neck Wounds: per nursing documentation     LABs:  CBC Latest Ref Rng & Units 01/21/2019 01/20/2019 01/17/2019  WBC 4.0 - 10.5 K/uL 10.5 20.5(H) 8.3  Hemoglobin 12.0 - 15.0 g/dL 14.6 14.5 16.4(H)  Hematocrit 36.0 - 46.0 % 43.0 41.1 47.7(H)  Platelets 150 - 400 K/uL 239 266 333   CMP Latest Ref Rng & Units 01/21/2019 01/20/2019 01/17/2019  Glucose 70 - 99 mg/dL 88 129(H) 110(H)  BUN 8 - 23 mg/dL 9 13 13   Creatinine 0.44 - 1.00 mg/dL 0.54 0.55 0.79  Sodium 135 - 145 mmol/L 140 135 137  Potassium 3.5 - 5.1 mmol/L 3.2(L) 3.5 4.1  Chloride 98 - 111 mmol/L 104 101 103  CO2 22 - 32 mmol/L 28 25 25   Calcium 8.9 - 10.3 mg/dL 8.6(L) 8.7(L) 8.9  Total Protein 6.1 - 8.1 g/dL - - -  Total Bilirubin 0.2 - 1.2 mg/dL - - -  Alkaline Phos 33 - 130 U/L - - -  AST 10 - 35 U/L - - -  ALT 6 - 29 U/L - - -        SIGNED: Arlan Organ, DO [facility]

## 2019-01-22 ENCOUNTER — Inpatient Hospital Stay (HOSPITAL_COMMUNITY)
Admission: RE | Admit: 2019-01-22 | Discharge: 2019-02-06 | DRG: 560 | Disposition: A | Discharge status: Home Health | Payer: Medicaid Other | Source: Intra-hospital | Attending: Physical Medicine and Rehabilitation | Admitting: Physical Medicine and Rehabilitation

## 2019-01-22 ENCOUNTER — Other Ambulatory Visit: Payer: Self-pay

## 2019-01-22 ENCOUNTER — Encounter (HOSPITAL_COMMUNITY): Payer: Self-pay

## 2019-01-22 DIAGNOSIS — Z880 Allergy status to penicillin: Secondary | ICD-10-CM

## 2019-01-22 DIAGNOSIS — Z8349 Family history of other endocrine, nutritional and metabolic diseases: Secondary | ICD-10-CM | POA: Diagnosis not present

## 2019-01-22 DIAGNOSIS — M4312 Spondylolisthesis, cervical region: Secondary | ICD-10-CM | POA: Diagnosis present

## 2019-01-22 DIAGNOSIS — G992 Myelopathy in diseases classified elsewhere: Secondary | ICD-10-CM | POA: Diagnosis present

## 2019-01-22 DIAGNOSIS — Z833 Family history of diabetes mellitus: Secondary | ICD-10-CM | POA: Diagnosis not present

## 2019-01-22 DIAGNOSIS — M4722 Other spondylosis with radiculopathy, cervical region: Secondary | ICD-10-CM | POA: Diagnosis present

## 2019-01-22 DIAGNOSIS — Z981 Arthrodesis status: Secondary | ICD-10-CM | POA: Diagnosis not present

## 2019-01-22 DIAGNOSIS — Z993 Dependence on wheelchair: Secondary | ICD-10-CM | POA: Diagnosis not present

## 2019-01-22 DIAGNOSIS — Z4789 Encounter for other orthopedic aftercare: Secondary | ICD-10-CM | POA: Diagnosis present

## 2019-01-22 DIAGNOSIS — J449 Chronic obstructive pulmonary disease, unspecified: Secondary | ICD-10-CM | POA: Diagnosis present

## 2019-01-22 DIAGNOSIS — K592 Neurogenic bowel, not elsewhere classified: Secondary | ICD-10-CM | POA: Diagnosis present

## 2019-01-22 DIAGNOSIS — F17210 Nicotine dependence, cigarettes, uncomplicated: Secondary | ICD-10-CM | POA: Diagnosis present

## 2019-01-22 DIAGNOSIS — N319 Neuromuscular dysfunction of bladder, unspecified: Secondary | ICD-10-CM | POA: Diagnosis present

## 2019-01-22 DIAGNOSIS — Z841 Family history of disorders of kidney and ureter: Secondary | ICD-10-CM | POA: Diagnosis not present

## 2019-01-22 DIAGNOSIS — Z8249 Family history of ischemic heart disease and other diseases of the circulatory system: Secondary | ICD-10-CM | POA: Diagnosis not present

## 2019-01-22 DIAGNOSIS — G35 Multiple sclerosis: Secondary | ICD-10-CM | POA: Diagnosis present

## 2019-01-22 DIAGNOSIS — G959 Disease of spinal cord, unspecified: Secondary | ICD-10-CM | POA: Diagnosis not present

## 2019-01-22 DIAGNOSIS — M7989 Other specified soft tissue disorders: Secondary | ICD-10-CM | POA: Diagnosis not present

## 2019-01-22 LAB — CBC
HCT: 45.4 % (ref 36.0–46.0)
Hemoglobin: 15.5 g/dL — ABNORMAL HIGH (ref 12.0–15.0)
MCH: 33.9 pg (ref 26.0–34.0)
MCHC: 34.1 g/dL (ref 30.0–36.0)
MCV: 99.3 fL (ref 80.0–100.0)
Platelets: 235 10*3/uL (ref 150–400)
RBC: 4.57 MIL/uL (ref 3.87–5.11)
RDW: 12.4 % (ref 11.5–15.5)
WBC: 9.2 10*3/uL (ref 4.0–10.5)
nRBC: 0 % (ref 0.0–0.2)

## 2019-01-22 LAB — BASIC METABOLIC PANEL
Anion gap: 9 (ref 5–15)
BUN: 7 mg/dL — ABNORMAL LOW (ref 8–23)
CO2: 25 mmol/L (ref 22–32)
Calcium: 8.9 mg/dL (ref 8.9–10.3)
Chloride: 105 mmol/L (ref 98–111)
Creatinine, Ser: 0.49 mg/dL (ref 0.44–1.00)
GFR calc Af Amer: >60 mL/min (ref >60)
GFR calc non Af Amer: >60 mL/min (ref >60)
Glucose, Bld: 87 mg/dL (ref 70–99)
Potassium: 4.1 mmol/L (ref 3.5–5.1)
Sodium: 139 mmol/L (ref 135–145)

## 2019-01-22 MED ORDER — NICOTINE 7 MG/24HR TD PT24: 7.0000 mg | MEDICATED_PATCH | Freq: Every day | TRANSDERMAL | 0 refills | Status: DC | PRN

## 2019-01-22 MED ORDER — TRAMADOL HCL 50 MG PO TABS
100.0000 mg | ORAL_TABLET | Freq: Four times a day (QID) | ORAL | Status: DC | PRN
  Administered 2019-01-23 – 2019-02-05 (×20): 100 mg via ORAL
  Filled 2019-01-22 (×22): qty 2

## 2019-01-22 MED ORDER — VITAMIN D 25 MCG (1000 UNIT) PO TABS
2000.0000 [IU] | ORAL_TABLET | Freq: Two times a day (BID) | ORAL | Status: DC
  Administered 2019-01-22 – 2019-02-06 (×30): 2000 [IU] via ORAL
  Filled 2019-01-22 (×38): qty 2

## 2019-01-22 MED ORDER — GABAPENTIN 300 MG PO CAPS: 600.0000 mg | ORAL_CAPSULE | Freq: Three times a day (TID) | ORAL | Status: DC

## 2019-01-22 MED ORDER — PROPRANOLOL HCL 20 MG PO TABS
60.0000 mg | ORAL_TABLET | Freq: Two times a day (BID) | ORAL | Status: DC
  Administered 2019-01-22 – 2019-02-06 (×30): 60 mg via ORAL
  Filled 2019-01-22 (×2): qty 1
  Filled 2019-01-22: qty 3
  Filled 2019-01-22 (×4): qty 1
  Filled 2019-01-22 (×2): qty 3
  Filled 2019-01-22: qty 1
  Filled 2019-01-22: qty 3
  Filled 2019-01-22 (×4): qty 1
  Filled 2019-01-22 (×2): qty 3
  Filled 2019-01-22: qty 1
  Filled 2019-01-22: qty 3
  Filled 2019-01-22 (×6): qty 1
  Filled 2019-01-22: qty 3
  Filled 2019-01-22 (×6): qty 1
  Filled 2019-01-22: qty 3

## 2019-01-22 MED ORDER — GABAPENTIN 300 MG PO CAPS
600.0000 mg | ORAL_CAPSULE | Freq: Three times a day (TID) | ORAL | Status: DC
  Administered 2019-01-22 – 2019-02-06 (×45): 600 mg via ORAL
  Filled 2019-01-22 (×6): qty 2
  Filled 2019-01-22: qty 6
  Filled 2019-01-22 (×7): qty 2
  Filled 2019-01-22: qty 6
  Filled 2019-01-22 (×30): qty 2
  Filled 2019-01-22: qty 6

## 2019-01-22 MED ORDER — CELECOXIB 200 MG PO CAPS
200.0000 mg | ORAL_CAPSULE | Freq: Two times a day (BID) | ORAL | Status: DC
  Administered 2019-01-22 – 2019-02-06 (×30): 200 mg via ORAL
  Filled 2019-01-22 (×32): qty 1

## 2019-01-22 MED ORDER — SORBITOL 70 % SOLN
30.0000 mL | Freq: Every day | Status: DC | PRN
  Administered 2019-01-22 – 2019-01-31 (×3): 30 mL via ORAL
  Filled 2019-01-22 (×3): qty 30

## 2019-01-22 MED ORDER — ACETAMINOPHEN 325 MG PO TABS: 650.0000 mg | ORAL_TABLET | Freq: Four times a day (QID) | ORAL | Status: DC | PRN

## 2019-01-22 MED ORDER — ONDANSETRON HCL 4 MG PO TABS: 4.0000 mg | ORAL_TABLET | Freq: Four times a day (QID) | ORAL | 0 refills | Status: DC | PRN

## 2019-01-22 MED ORDER — OXYCODONE HCL 10 MG PO TABS: 10.0000 mg | ORAL_TABLET | ORAL | 0 refills | Status: DC | PRN

## 2019-01-22 MED ORDER — ONDANSETRON HCL 4 MG PO TABS: 4.0000 mg | ORAL_TABLET | Freq: Four times a day (QID) | ORAL | Status: DC | PRN

## 2019-01-22 MED ORDER — ACETAMINOPHEN 650 MG RE SUPP: 650.0000 mg | Freq: Four times a day (QID) | RECTAL | Status: DC | PRN

## 2019-01-22 MED ORDER — DIAZEPAM 5 MG PO TABS: 5.0000 mg | ORAL_TABLET | Freq: Four times a day (QID) | ORAL | 0 refills | Status: DC | PRN

## 2019-01-22 MED ORDER — ONDANSETRON HCL 4 MG/2ML IJ SOLN: 4.0000 mg | Freq: Four times a day (QID) | INTRAMUSCULAR | Status: DC | PRN

## 2019-01-22 MED ORDER — DIAZEPAM 5 MG PO TABS: 5.0000 mg | ORAL_TABLET | Freq: Four times a day (QID) | ORAL | Status: DC | PRN

## 2019-01-22 MED ORDER — OXYCODONE HCL 5 MG PO TABS
10.0000 mg | ORAL_TABLET | ORAL | Status: DC | PRN
  Administered 2019-01-22 – 2019-02-06 (×18): 10 mg via ORAL
  Filled 2019-01-22 (×18): qty 2

## 2019-01-22 MED ORDER — CELECOXIB 200 MG PO CAPS: 200.0000 mg | ORAL_CAPSULE | Freq: Two times a day (BID) | ORAL | Status: DC

## 2019-01-22 MED ORDER — NICOTINE 7 MG/24HR TD PT24
7.0000 mg | MEDICATED_PATCH | Freq: Every day | TRANSDERMAL | Status: DC | PRN
  Filled 2019-01-22: qty 1

## 2019-01-22 NOTE — H&P (Signed)
Physical Medicine and Rehabilitation Admission H&P    Chief Complaint  Patient presents with   Numbness  : HPI: Faith [name] is a 63 year old right-handed female with history of COPD/tobacco abuse, back pain, multiple sclerosis diagnosed 3 years ago.  Per chart review lives with her children.  1 level home 4 steps to entry.  She was able to transfer as well as ambulate independently with some increased time for task prior to admission.  Presented 01/17/2019 with progressive weakness.  MRI of the brain stable since 2019.  Advanced chronic demyelinating disease with no progressive or active demyelination identified.  No new intracranial abnormality.  MRI cervical spine despite the advanced chronic demyelinating disease the symptomatic abnormality appears to be degenerative in nature.  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  Associated acute exacerbation of the chronic facet arthropathy at C2-3 and 3-4.  Underlying chronic cervical cord demyelinating disease appears stable without progression.  Patient initially seen by neurology services suspect multiple sclerosis exacerbation started on Solu-Medrol.  On 01/19/2019 patient patient reporting improvement paresthesia continued to have weakness.  Neurosurgery consulted to review MRI cervical spine noting spinal cord compression/synovial cyst with noted cervical spondylolisthesis and patient underwent posterior cervical laminectomy for synovial cyst resection 01/19/2019 per Dr. Christella Noa.  Hospital course pain management.  No cervical brace required.  Her IV Solu-Medrol was discontinued 01/20/2019 as MS exacerbation was ruled out.  Patient is tolerating a regular diet.  Therapy evaluations completed and patient was admitted for a comprehensive rehab program  Review of Systems  Constitutional: Negative for chills and  fever.  HENT: Negative for hearing loss.   Eyes: Negative for blurred vision and double vision.  Respiratory: Negative for cough and shortness of breath.   Cardiovascular: Positive for leg swelling. Negative for chest pain and palpitations.  Gastrointestinal: Positive for constipation. Negative for heartburn, nausea and vomiting.  Genitourinary: Positive for urgency. Negative for dysuria, flank pain and hematuria.  Musculoskeletal: Positive for joint pain.  Skin: Negative for rash.  Neurological: Positive for dizziness, sensory change and weakness.  Psychiatric/Behavioral: The patient has insomnia.   All other systems reviewed and are negative.  Past Medical History:  Diagnosis Date   Anemia    my whole life, Used to take iron   Arthritis    spine, lumbar   COPD (chronic obstructive pulmonary disease) (HCC)    Emphysema of lung ([place])    Narcolepsy    by history, has nightmares   Neuromuscular disorder ([place])    leg issues from spine   Past Surgical History:  Procedure Laterality Date   EYE SURGERY Right    ORIF FACIAL FRACTURE     TUBAL LIGATION     tubaligation     Family History  Problem Relation Age of Onset   Heart attack Mother    Kidney failure Mother    Diabetes Mother    Heart disease Mother    Hyperlipidemia Mother    Hypertension Mother    Heart attack Father 34   Hypertension Father    Hypertension Sister    Diabetes Sister    Other Brother        house fire   Hypertension Brother    Seizures Brother    Heart disease Brother        mitral valve   Arthritis Daughter        DJD, AS  fibromyalgia   Polymyositis Daughter    Cancer Maternal Grandfather        lung   Heart disease Maternal Grandfather    Other Paternal Grandmother        house fire   Alcohol abuse Son    Social History:  reports that she has been smoking. She has a 11.25 pack-year smoking history. She has never used smokeless tobacco. She reports current  alcohol use of about 2.0 standard drinks of alcohol per week. She reports that she does not use drugs. Allergies:  Allergies  Allergen Reactions   Darvon [Propoxyphene] Other (See Comments)    Hallicuations   Penicillins Rash    Did it involve swelling of the face/tongue/throat, SOB, or low BP? Unknown Did it involve sudden or severe rash/hives, skin peeling, or any reaction on the inside of your mouth or nose? Unknown Did you need to seek medical attention at a hospital or doctor's office? Unknown When did it last happen?Unknown If all above answers are NO, may proceed with cephalosporin use.    Medications Prior to Admission  Medication Sig Dispense Refill   Cholecalciferol (VITAMIN D) 50 MCG (2000 UT) CAPS Take 2,000 Units by mouth 2 (two) times daily.      gabapentin (NEURONTIN) 300 MG capsule Take 1 capsule (300 mg total) by mouth 3 (three) times daily. start with one at bedtime.  Increase as tolerated to TID (Patient taking differently: Take 600 mg by mouth 3 (three) times daily. ) 180 capsule 3   IBU 800 MG tablet TAKE (1) TABLET BY MOUTH EVERY EIGHT HOURS AS NEEDED. (Patient taking differently: Take 800 mg by mouth every 8 (eight) hours as needed for mild pain or moderate pain. ) 90 tablet 1   propranolol (INDERAL) 60 MG tablet Take 60 mg by mouth 2 (two) times daily.      traMADol (ULTRAM) 50 MG tablet Take 100 mg by mouth every 6 (six) hours as needed for moderate pain.       Drug Regimen Review Drug regimen was reviewed and remains appropriate with no significant issues identified  Home: Home Living Family/patient expects to be discharged to:: Private residence Living Arrangements: Children Available Help at Discharge: Family Type of Home: House Home Access: Stairs to enter Technical brewer of Steps: 4 Entrance Stairs-Rails: Left Home Layout: One level Bathroom Shower/Tub: Multimedia programmer: Standard Bathroom Accessibility: No Home  Equipment: Environmental consultant - 4 wheels, Shower seat, Hospital bed   Functional History: Prior Function Level of Independence: Needs assistance Gait / Transfers Assistance Needed: Able to transfer/ambulate independently but with increased time and effort ADL's / Homemaking Assistance Needed: Needs assistance- son helps her reach cabinets, do dishes, etc. Communication / Swallowing Assistance Needed: Independent  Functional Status:  Mobility: Bed Mobility Overal bed mobility: Needs Assistance Bed Mobility: Rolling, Sidelying to Sit Rolling: Min guard Sidelying to sit: Min guard Supine to sit: Min guard Sit to supine: Min assist General bed mobility comments: min guard for safety, cueing for log roll technique to protect her cervical spine Transfers Overall transfer level: Needs assistance Equipment used: Rolling walker (2 wheeled) Transfers: Sit to/from Stand, [employer] Transfers Sit to Stand: Mod assist Stand pivot transfers: Mod assist General transfer comment: good technique, assistance needed to power into standing and for transitional movement from bed to [facility] and then to recliner chair; pt with great difficulty advancing R LE forwards and very ataxic with movement Ambulation/Gait Ambulation/Gait assistance: Mod assist, +2 safety/equipment Gait Distance (Feet):  35 Feet Assistive device: Rolling walker (2 wheeled) Gait Pattern/deviations: Step-to pattern, Decreased step length - right, Decreased step length - left, Shuffle, Ataxic General Gait Details: attempted ambulation; however, unsafe as pt was very ataxic and unsteady, required mod A to take pivotal side steps to recliner chair Gait velocity: decr    ADL: ADL Overall ADL's : Needs assistance/impaired Eating/Feeding: Set up, Minimal assistance, Sitting Eating/Feeding Details (indicate cue type and reason): Issued weighted utensils and plate guard as pt reports she has been struggling with feeding tasks Grooming: Sitting, Maximal  assistance Upper Body Bathing: Sitting, Maximal assistance Lower Body Bathing: Sit to/from stand, Maximal assistance Upper Body Dressing : Sitting, Maximal assistance Lower Body Dressing: Maximal assistance, Sit to/from stand Toilet Transfer: Moderate assistance, +2 for safety/equipment, Stand-pivot, RW Toilet Transfer Details (indicate cue type and reason): simulated with recliner to EOB. Pt using walker vs bed rail. Assist to power up, steady and pivot fully.  Toileting- Clothing Manipulation and Hygiene: Moderate assistance, Maximal assistance, Sit to/from stand, Sitting/lateral lean General ADL Comments: Simulated toilet transfer (recliner to EOB) and bed mobility completed during session. Pt relying on gross strength and momentum to facilitate transfers and bed mobility.   Cognition: Cognition Overall Cognitive Status: Within Functional Limits for tasks assessed Orientation Level: Oriented X4 Cognition Arousal/Alertness: Awake/alert Behavior During Therapy: WFL for tasks assessed/performed, Anxious Overall Cognitive Status: Within Functional Limits for tasks assessed  Physical Exam: Blood pressure (!) 170/96, pulse 68, temperature 97.9 F (36.6 C), temperature source Oral, resp. rate 20, height 5\' 6"  (1.676 m), weight 45.4 kg, SpO2 99 %. Gen: no distress, normal appearing HEENT: oral mucosa pink and moist, NCAT Cardio: Reg rate Chest: normal effort, normal rate of breathing Abd: soft, non-distended Ext: no edema Skin: intact; surgical site C/D/I Neuro/Musculoskeletal: AOx3. Follows commands. MAS 3 for bilateral knee flexion, MAS 1 at bilateral elbow flexion.  RUE: 4/5 in SA, EE, EF, and WF, 4-/5 in hand grip. Improved sensation throughout right upper extremity as compared to prior exam, but still slightly diminished as compared to left.  RLE: 3/5 HF, 4/5 KE, DF, PF LUE: 4/5 throughout LLE: 3/5 HF, 4/5 KE, DF, PF Psych: pleasant, normal affect  Results for orders placed or  performed during the hospital encounter of 01/17/19 (from the past 48 hour(s))  CBC     Status: None   Collection Time: 01/21/19  3:07 AM  Result Value Ref Range   WBC 10.5 4.0 - 10.5 K/uL   RBC 4.33 3.87 - 5.11 MIL/uL   Hemoglobin 14.6 12.0 - 15.0 g/dL   HCT 43.0 36.0 - 46.0 %   MCV 99.3 80.0 - 100.0 fL   MCH 33.7 26.0 - 34.0 pg   MCHC 34.0 30.0 - 36.0 g/dL   RDW 12.3 11.5 - 15.5 %   Platelets 239 150 - 400 K/uL   nRBC 0.0 0.0 - 0.2 %    Comment: Performed at [facility], [place] [address] [place], [place] [id number]  Basic metabolic panel     Status: Abnormal   Collection Time: 01/21/19  3:07 AM  Result Value Ref Range   Sodium 140 135 - 145 mmol/L   Potassium 3.2 (L) 3.5 - 5.1 mmol/L   Chloride 104 98 - 111 mmol/L   CO2 28 22 - 32 mmol/L   Glucose, Bld 88 70 - 99 mg/dL   BUN 9 8 - 23 mg/dL   Creatinine, Ser 0.54 0.44 - 1.00 mg/dL   Calcium 8.6 (L) 8.9 -  10.3 mg/dL   GFR calc non Af Amer >60 >60 mL/min   GFR calc Af Amer >60 >60 mL/min   Anion gap 8 5 - 15    Comment: Performed at [place] [address], [place], [place] [id number]  Basic metabolic panel     Status: Abnormal   Collection Time: 01/22/19  6:11 AM  Result Value Ref Range   Sodium 139 135 - 145 mmol/L   Potassium 4.1 3.5 - 5.1 mmol/L   Chloride 105 98 - 111 mmol/L   CO2 25 22 - 32 mmol/L   Glucose, Bld 87 70 - 99 mg/dL   BUN 7 (L) 8 - 23 mg/dL   Creatinine, Ser 0.49 0.44 - 1.00 mg/dL   Calcium 8.9 8.9 - 10.3 mg/dL   GFR calc non Af Amer >60 >60 mL/min   GFR calc Af Amer >60 >60 mL/min   Anion gap 9 5 - 15    Comment: Performed at [facility], [place] [address] [place], [place] [address]  CBC     Status: Abnormal   Collection Time: 01/22/19  6:11 AM  Result Value Ref Range   WBC 9.2 4.0 - 10.5 K/uL   RBC 4.57 3.87 - 5.11 MIL/uL   Hemoglobin 15.5 (H) 12.0 - 15.0 g/dL   HCT 45.4 36.0 - 46.0 %   MCV 99.3 80.0 - 100.0 fL   MCH 33.9 26.0 - 34.0 pg   MCHC 34.1 30.0 - 36.0 g/dL   RDW  12.4 11.5 - 15.5 %   Platelets 235 150 - 400 K/uL   nRBC 0.0 0.0 - 0.2 %    Comment: Performed at [facility], [place] [address], [place], [place] [address]   No results found.   Medical Problem List and Plan: 1.  Decrease in strength, mobility and coordination secondary to cervical synovial cyst, spinal cord compression, cervical spondylolisthesis with radiculopathy C3-4.S/P posterior cervical laminectomy for synovial cyst resection, posterior cervical arthrodesis with anterior instrumentation 01/19/2019.  No cervical brace required  -Will discuss with surgery team to determine when patient may shower.  -ELOS/Goals: Mod I PT and OT, I SLP 2.  Antithrombotics: -DVT/anticoagulation: SCDs.  Check vascular study  -antiplatelet therapy: N/A 3. Pain Management: Celebrex 200 mg every 12 hours, Neurontin 600 mg 3 times daily, oxycodone/tramadol and Valium as needed 4. Mood: Provide emotional support  -antipsychotic agents: N/A 5. Neuropsych: This patient is capable of making decisions on her own behalf. 6. Skin/Wound Care: Routine skin checks 7. Fluids/Electrolytes/Nutrition: Routine in and outs with follow-up chemistries 8.  Multiple sclerosis.  Diagnosed 3 years ago.  Neurology services.  Continue Inderal 60 mg twice daily 9.  Tobacco abuse.  NicoDerm patch 10.  Neurogenic bowel bladder.  Modified bowel program.  Check PVR  Cathlyn Parsons, PA-C 01/22/2019   I have personally performed a face to face diagnostic evaluation, including, but not limited to relevant history and physical exam findings, of this patient and developed relevant assessment and plan.  Additionally, I have reviewed and concur with the physician assistant's documentation above.  Leeroy Cha, MD

## 2019-01-22 NOTE — Progress Notes (Addendum)
Occupational Therapy Treatment Patient Details Name: Faith [name] MRN: [id number] DOB: 1955-04-11 Today's Date: 01/22/2019    History of present illness Pt is a 63 y.o. F admitted for an exacerbation of multiple sclerosis. She has a PMH including but not limited to COPD, back pain, and tobacco use. Pt now s/p POSTERIOR CERVICAL LAMINECTOMY Removal of Synovial Cyst and posterior cervical fusion and ANTERIOR CERVICAL DECOMPRESSION/DISCECTOMY Cervical three - four.   OT comments  Pt making steady progress towards OT goals this session. Session focus on functional mobility, seated grooming and self- feeding.Pt required cues to carrryover log roll technique to transition to EOB but completed task with min guard. Pt initially required min guard- MIN A for sitting balance progressing to supervision for seated grooming EOB with set- up. Pt complete toilet transfer to [facility] via stand pivot transfer with RW; pt required MIN guard for safety with RW and set- up assist for anterior pericare utilizing lateral leans. Pt able to take steps today to recliner with RW and min guard- min A. Pt continues to be ataxic with movement requiring intermittent MIN A for balance. Pt set- up in chair for self- feeding with built up utensils. Pt able to self- feed with MIN A needing assist to open packets and for positoning. Assisted pt with positioning RUE under pillow to assist with endurance for self- feeding. Pt hopeful to DC to CIR soon, will continue to follow acutely per POC.     Follow Up Recommendations  CIR    Equipment Recommendations  Other (comment)(tbd to next venue of care)    Recommendations for Other Services      Precautions / Restrictions Precautions Precautions: Fall;Cervical Precaution Booklet Issued: Yes (comment) Precaution Comments: reviewed log roll technique and all cervical precautions Required Braces or Orthoses: (no brace needed per orders) Restrictions Weight Bearing Restrictions: No       Mobility Bed Mobility Overal bed mobility: Needs Assistance Bed Mobility: Rolling;Sidelying to Sit Rolling: Min guard Sidelying to sit: Min guard       General bed mobility comments: min guard for safety, cueing for log roll technique to protect her cervical spine  Transfers Overall transfer level: Needs assistance Equipment used: Rolling walker (2 wheeled) Transfers: Sit to/from [facility] Sit to Stand: Min guard Stand pivot transfers: Min guard       General transfer comment: Min guard for safety sit>stand with RW, MIN guard stand pivot to [facility], cues to hand placement.    Balance Overall balance assessment: Needs assistance Sitting-balance support: Feet supported;Bilateral upper extremity supported;Single extremity supported Sitting balance-Leahy Scale: Poor Sitting balance - Comments: pt initially required MIN guard- MIN assist progressing to supervision for EOB grooming   Standing balance support: Bilateral upper extremity supported Standing balance-Leahy Scale: Poor Standing balance comment: BUE supported by RW                            ADL either performed or assessed with clinical judgement   ADL Overall ADL's : Needs assistance/impaired Eating/Feeding: Set up;Minimal assistance;Sitting Eating/Feeding Details (indicate cue type and reason): visual demo of use of rocker knife with pt return demonstrating with good carryover; MIN A to open lids and pour creamer into coffee Grooming: Wash/dry hands;Sitting;Set up                   Toilet Transfer: Ambulation;RW;Min guard;Stand-pivot;BSC Toilet Transfer Details (indicate cue type and reason): pt able to stand pivot to  BSC from EOB with min guard with RW, cues for hand placement. simualted to recliner with RW via functional mobility. Pt required MIN Guard for balance d/t pt ataxia Toileting- Clothing Manipulation and Hygiene: Set up;Sitting/lateral lean Toileting - Clothing  Manipulation Details (indicate cue type and reason): pt able to complete anterior pericare via lateral leans with set- up asssit     Functional mobility during ADLs: Min guard;Rolling walker General ADL Comments: pt able to take a couple steps to recliner this session with close min guard with RW. pt required close min guard for EOB grooming progressing to supervision with increaased time     Vision       Perception     Praxis      Cognition Arousal/Alertness: Awake/alert Behavior During Therapy: [facility] for tasks assessed/performed;Anxious Overall Cognitive Status: Within Functional Limits for tasks assessed                                 General Comments: pleasant and agreeable to session        Exercises     Shoulder Instructions       General Comments pt reports needing a ramp when ready to DC home as pt has 4 steps to enter house and reports difficulty getting in/out of house safely.     Pertinent Vitals/ Pain       Pain Assessment: No/denies pain  Home Living                                          Prior Functioning/Environment              Frequency  Min 2X/week        Progress Toward Goals  OT Goals(current goals can now be found in the care plan section)  Progress towards OT goals: Progressing toward goals  Acute Rehab OT Goals Patient Stated Goal: to get better and stronger OT Goal Formulation: With patient Time For Goal Achievement: 02/01/19 Potential to Achieve Goals: Good  Plan Discharge plan remains appropriate    Co-evaluation                 AM-PAC OT "6 Clicks" Daily Activity     Outcome Measure   Help from another person eating meals?: A Little Help from another person taking care of personal grooming?: A Lot Help from another person toileting, which includes using toliet, bedpan, or urinal?: A Lot Help from another person bathing (including washing, rinsing, drying)?: A Lot Help from  another person to put on and taking off regular upper body clothing?: A Lot Help from another person to put on and taking off regular lower body clothing?: A Lot 6 Click Score: 13    End of Session Equipment Utilized During Treatment: Gait belt;Rolling walker  OT Visit Diagnosis: Unsteadiness on feet (R26.81);Muscle weakness (generalized) (M62.81);History of falling (Z91.81);Other abnormalities of gait and mobility (R26.89)   Activity Tolerance Patient tolerated treatment well   Patient Left in chair;with call bell/phone within reach;with chair alarm set   Nurse Communication Mobility status        Time: [id number] OT Time Calculation (min): 36 min  Charges: OT General Charges $OT Visit: 1 Visit OT Treatments $Self Care/Home Management : 23-37 mins  Lanier Clam., COTA/L Acute Rehabilitation Services [id number]    [facility]  K Renn Stille 01/22/2019, 12:29 PM

## 2019-01-22 NOTE — Progress Notes (Signed)
Inpatient Rehabilitation-Admissions Coordinator   I have received medical clearance from Dr. Reesa Chew for admit to CIR today. Pt is in agreement and wanting to proceed. Insurance benefits letter reviewed and consent form signed. RN and [facility] team notified of plan for admit to CIR today.   Please call if questions.   Jhonnie Garner, OTR/L  Rehab Admissions Coordinator  [phone] 01/22/2019 2:17 PM

## 2019-01-22 NOTE — PMR Pre-admission (Signed)
PMR Admission Coordinator Pre-Admission Assessment  Patient: Faith [name] is an 63 y.o., female MRN: [id number] DOB: 1955-08-18 Height: 5\' 6"  (167.6 cm) Weight: 45.4 kg              Insurance Information HMO:     PPO:      PCP:      IPA:      80/20:      OTHER:  PRIMARY: Medicaid [place]      Policy#: [id number]      Subscriber: Patient CM Name:       Phone#:      Fax#:  Pre-Cert#:       Employer:  Coverage Code: MADCY Benefits:  Phone #: [phone]     Name: automated system  Eff. Date: verified eligibility on 01/22/2019     Deduct:       Out of Pocket Max:       Life Max:  CIR: Covered per Medicaid guidelines      SNF:  Outpatient:      Co-Pay:  Home Health:       Co-Pay:  DME:      Co-Pay:  Providers:  SECONDARY: None      Policy#:       Subscriber:  CM Name:       Phone#:      Fax#:  Pre-Cert#:       Employer:  Benefits:  Phone #:      Name:  Eff. Date:      Deduct:      Out of Pocket Max:       Life Max:  CIR:       SNF:  Outpatient:      Co-Pay:  Home Health:       Co-Pay: DME:      Co-Pay:   Medicaid Application Date:       Case Manager:  Disability Application Date:       Case Worker:   The "Data Collection Information Summary" for patients in Inpatient Rehabilitation Facilities with attached "Privacy Act [place] Records" was provided and verbally reviewed with: NA  Emergency Contact Information Contact Information    Name Relation Home Work Mobile   Roland,Christina Daughter [phone]  [phone]   Calvert Cantor [phone]  [phone]     Current Medical History  Patient Admitting Diagnosis: cervical spondylolisthesis with accompanying  spinal cord compression/synovial cyst s/p resection of cyst.  History of Present Illness:  Faith [name] is a 63 year old female with history of COPD/tobacco abuse, back pain, multiple sclerosis diagnosed 3 years ago.  Per chart review lives with her children.  1 level home 4 steps to entry.  She was  able to transfer as well as ambulate independently with some increased time for task prior to admission.  Presented 01/17/2019 with progressive weakness.  MRI of the brain stable since 2019.  Advanced chronic demyelinating disease with no progressive or active demyelination identified.  No new intracranial abnormality.  MRI cervical spine despite the advanced chronic demyelinating disease the symptomatic abnormality appears to be degenerative in nature.  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  Associated acute exacerbation of the chronic facet arthropathy at C2-3 and 3-4.  Underlying chronic cervical cord demyelinating disease appears stable without progression.  Patient initially seen by neurology services suspect multiple sclerosis exacerbation started on Solu-Medrol.  On 01/19/2019 patient patient reporting improvement paresthesia  continued to have weakness.  Neurosurgery consulted to review MRI cervical spine noting spinal cord compression/synovial cyst with noted cervical spondylolisthesis and patient underwent posterior cervical laminectomy for synovial cyst resection 01/19/2019 per Dr. Christella Noa.  Hospital course pain management.  No cervical brace required.  Her IV Solu-Medrol was discontinued 01/20/2019 as MS exacerbation was ruled out.  Patient is tolerating a regular diet.  Therapy evaluations completed and patient is to be admitted for a comprehensive rehab program on 01/22/2019.      Past Medical History  Past Medical History:  Diagnosis Date  . Anemia    my whole life, Used to take iron  . Arthritis    spine, lumbar  . COPD (chronic obstructive pulmonary disease) ([place])   . Emphysema of lung ([place])   . Narcolepsy    by history, has nightmares  . Neuromuscular disorder ([place])    leg issues from spine    Family History  family history includes  Alcohol abuse in her son; Arthritis in her daughter; Cancer in her maternal grandfather; Diabetes in her mother and sister; Heart attack in her mother; Heart attack (age of onset: 37) in her father; Heart disease in her brother, maternal grandfather, and mother; Hyperlipidemia in her mother; Hypertension in her brother, father, mother, and sister; Kidney failure in her mother; Other in her brother and paternal grandmother; Polymyositis in her daughter; Seizures in her brother.  Prior Rehab/Hospitalizations:  Has the patient had prior rehab or hospitalizations prior to admission? No  Has the patient had major surgery during 100 days prior to admission? Yes  Current Medications   Current Facility-Administered Medications:  .  0.9 %  sodium chloride infusion, 250 mL, Intravenous, Continuous, Cabbell, Kyle, MD .  0.9 % NaCl with KCl 20 mEq/ L  infusion, , Intravenous, Continuous, Cabbell, Kyle, MD, Last Rate: 80 mL/hr at 01/22/19 0531 .  acetaminophen (TYLENOL) tablet 650 mg, 650 mg, Oral, Q6H PRN **OR** acetaminophen (TYLENOL) suppository 650 mg, 650 mg, Rectal, Q6H PRN, Ashok Pall, MD .  celecoxib (CELEBREX) capsule 200 mg, 200 mg, Oral, Q12H, Ashok Pall, MD, 200 mg at 01/22/19 0805 .  cholecalciferol (VITAMIN D3) tablet 2,000 Units, 2,000 Units, Oral, BID, Ashok Pall, MD, 2,000 Units at 01/22/19 0805 .  diazepam (VALIUM) tablet 5 mg, 5 mg, Oral, Q6H PRN, Ashok Pall, MD .  gabapentin (NEURONTIN) capsule 600 mg, 600 mg, Oral, TID, Ashok Pall, MD, 600 mg at 01/22/19 0806 .  menthol-cetylpyridinium (CEPACOL) lozenge 3 mg, 1 lozenge, Oral, PRN **OR** phenol (CHLORASEPTIC) mouth spray 1 spray, 1 spray, Mouth/Throat, PRN, Ashok Pall, MD .  morphine 2 MG/ML injection 1 mg, 1 mg, Intravenous, Q2H PRN, Ashok Pall, MD .  nicotine (NICODERM CQ - dosed in mg/24 hr) patch 7 mg, 7 mg, Transdermal, Daily PRN, Ashok Pall, MD .  ondansetron (ZOFRAN) tablet 4 mg, 4 mg, Oral, Q6H PRN **OR**  ondansetron (ZOFRAN) injection 4 mg, 4 mg, Intravenous, Q6H PRN, Ashok Pall, MD .  oxyCODONE (Oxy IR/ROXICODONE) immediate release tablet 10 mg, 10 mg, Oral, Q3H PRN, Ashok Pall, MD, 10 mg at 01/22/19 0524 .  propranolol (INDERAL) tablet 60 mg, 60 mg, Oral, BID, Ashok Pall, MD, 60 mg at 01/22/19 0806 .  sodium chloride flush (NS) 0.9 % injection 3 mL, 3 mL, Intravenous, Q12H, Ashok Pall, MD, 3 mL at 01/22/19 0811 .  sodium chloride flush (NS) 0.9 % injection 3 mL, 3 mL, Intravenous, Q12H, Ashok Pall, MD, 3 mL at 01/22/19 0812 .  sodium chloride  flush (NS) 0.9 % injection 3 mL, 3 mL, Intravenous, PRN, Ashok Pall, MD .  traMADol Veatrice Bourbon) tablet 100 mg, 100 mg, Oral, Q6H PRN, Ashok Pall, MD, 100 mg at 01/21/19 1227  Patients Current Diet:  Diet Order            Diet - low sodium heart healthy        Diet regular Room service appropriate? Yes; Fluid consistency: Thin  Diet effective now              Precautions / Restrictions Precautions Precautions: Fall, Cervical Precaution Booklet Issued: Yes (comment) Precaution Comments: reviewed log roll technique and all cervical precautions Restrictions Weight Bearing Restrictions: No   Has the patient had 2 or more falls or a fall with injury in the past year?Yes  Prior Activity Level Limited Community (1-2x/wk): did not work or drive; would get out of the house for MD appointments; uses Transport planner at grocery store.   Prior Functional Level Prior Function Level of Independence: Needs assistance Gait / Transfers Assistance Needed: Able to transfer/ambulate independently but with increased time and effort ADL's / Homemaking Assistance Needed: Needs assistance- son helps her reach cabinets, do dishes, etc. Communication / Swallowing Assistance Needed: Independent  Self Care: Did the patient need help bathing, dressing, using the toilet or eating?  Independent  Indoor Mobility: Did the patient need assistance  with walking from room to room (with or without device)? Independent  Stairs: Did the patient need assistance with internal or external stairs (with or without device)? Needed some help  Functional Cognition: Did the patient need help planning regular tasks such as shopping or remembering to take medications? [place] / [place] Devices/Equipment: Environmental consultant (specify type) Home Equipment: Walker - 4 wheels, Shower seat, Hospital bed  Prior Device Use: Indicate devices/aids used by the patient prior to current illness, exacerbation or injury? Rollator at home; shower chair, uese the store electric scooter for grocery shopping  Current Functional Level Cognition  Overall Cognitive Status: Within Functional Limits for tasks assessed Orientation Level: Oriented X4 General Comments: pleasant and agreeable to session    Extremity Assessment (includes Sensation/Coordination)  Upper Extremity Assessment: RUE deficits/detail, LUE deficits/detail RUE Deficits / Details: 3/5 gross strength. Right handed but currently using L hand to feed self. RUE Coordination: decreased fine motor, decreased gross motor LUE Deficits / Details: 3+/5 gross strength. Pt reports she feels LUE is slightly stronger. R hand dominant but currently using L hand for feedind tasks LUE Coordination: decreased fine motor, decreased gross motor  Lower Extremity Assessment: Defer to PT evaluation    ADLs  Overall ADL's : Needs assistance/impaired Eating/Feeding: Set up, Minimal assistance, Sitting Eating/Feeding Details (indicate cue type and reason): visual demo of use of rocker knife with pt return demonstrating with good carryover; MIN A to open lids and pour creamer into coffee Grooming: Wash/dry hands, Sitting, Set up Upper Body Bathing: Sitting, Maximal assistance Lower Body Bathing: Sit to/from stand, Maximal assistance Upper Body Dressing : Sitting, Maximal assistance Lower  Body Dressing: Maximal assistance, Sit to/from stand Toilet Transfer: Ambulation, RW, Min guard, Stand-pivot, BSC Toilet Transfer Details (indicate cue type and reason): pt able to stand pivot to [facility] from EOB with min guard with RW, cues for hand placement. simualted to recliner with RW via functional mobility. Pt required MIN Guard for balance d/t pt ataxia Toileting- Clothing Manipulation and Hygiene: Set up, Sitting/lateral lean Toileting - Clothing Manipulation Details (indicate cue type and  reason): pt able to complete anterior pericare via lateral leans with set- up asssit Functional mobility during ADLs: Min guard, Rolling walker General ADL Comments: pt able to take a couple steps to recliner this session with close min guard with RW. pt required close min guard for EOB grooming progressing to supervision with increaased time    Mobility  Overal bed mobility: Needs Assistance Bed Mobility: Rolling, Sidelying to Sit Rolling: Min guard Sidelying to sit: Min guard Supine to sit: Min guard Sit to supine: Min assist General bed mobility comments: min guard for safety, cueing for log roll technique to protect her cervical spine    Transfers  Overall transfer level: Needs assistance Equipment used: Rolling walker (2 wheeled) Transfers: Sit to/from Stand, [employer] Transfers Sit to Stand: Min guard Stand pivot transfers: Min guard General transfer comment: Min guard for safety sit>stand with RW, MIN guard stand pivot to [facility], cues to hand placement.    Ambulation / Gait / Stairs / Wheelchair Mobility  Ambulation/Gait Ambulation/Gait assistance: Mod assist, +2 safety/equipment Gait Distance (Feet): 35 Feet Assistive device: Rolling walker (2 wheeled) Gait Pattern/deviations: Step-to pattern, Decreased step length - right, Decreased step length - left, Shuffle, Ataxic General Gait Details: attempted ambulation; however, unsafe as pt was very ataxic and unsteady, required mod A to take  pivotal side steps to recliner chair Gait velocity: decr    Posture / Balance Dynamic Sitting Balance Sitting balance - Comments: pt initially required MIN guard- MIN assist progressing to supervision for EOB grooming Balance Overall balance assessment: Needs assistance Sitting-balance support: Feet supported, Bilateral upper extremity supported, Single extremity supported Sitting balance-Leahy Scale: Poor Sitting balance - Comments: pt initially required MIN guard- MIN assist progressing to supervision for EOB grooming Postural control: Other (comment)(no specific direction of lean, weak overall) Standing balance support: Bilateral upper extremity supported Standing balance-Leahy Scale: Poor Standing balance comment: BUE supported by RW     Special needs/care consideration BiPAP/CPAP: no CPM: no Continuous Drip IV: 0.9% NaCl with KCl 20 mEq/L infusion  Dialysis: no        Days: no Life Vest: no Oxygen: no Special Bed: no Trach Size : no Wound Vac (area): no      Location: no Skin: surgical incision to neck (Anterior and Posterior)                           Bowel mgmt: continent, last BM 01/20/2019 Bladder mgmt: incontinent Diabetic mgmt: no Behavioral consideration : no Chemo/radiation : no     Previous Home Environment (from acute therapy documentation) Living Arrangements: Children Available Help at Discharge: Family Type of Home: House Home Layout: One level Home Access: Stairs to enter Entrance Stairs-Rails: Left Entrance Stairs-Number of Steps: 4 Bathroom Shower/Tub: Multimedia programmer: Standard Bathroom Accessibility: No Home Care Services: No  Discharge Living Setting Plans for Discharge Living Setting: Patient's home, Lives with (comment)(son (age 53) and grandson (age 47)) Type of Home at Discharge: House Discharge Home Layout: One level Discharge Home Access: Stairs to enter Entrance Stairs-Rails: Left Entrance Stairs-Number of Steps: 3-4  steps Discharge Bathroom Shower/Tub: Walk-in shower Discharge Bathroom Toilet: Standard Discharge Bathroom Accessibility: Yes How Accessible: Accessible via walker(if sidesteps (will need instruction)) Does the patient have any problems obtaining your medications?: No  Social/Family/Support Systems Patient Roles: Other (Comment)(close family support) Contact Information: son: [name] (home [phone]; cell: [phone]) Anticipated Caregiver: son  Anticipated Caregiver's Contact Information: see above Ability/Limitations of Caregiver:  Min A Caregiver Availability: 24/7 Discharge Plan Discussed with Primary Caregiver: Yes Is Caregiver In Agreement with Plan?: Yes Does Caregiver/Family have Issues with Lodging/Transportation while Pt is in Rehab?: No   Goals/Additional Needs Patient/Family Goal for Rehab: PT/OT: Mod I/Supervision; SLP: NA Expected length of stay: 10-14 days Cultural Considerations: NA Dietary Needs: regular diet, thin liquids Equipment Needs: TBD Special Service Needs: using AE equipment for feeding on acute Pt/Family Agrees to Admission and willing to participate: Yes Program Orientation Provided & Reviewed with Pt/Caregiver Including Roles  & Responsibilities: Yes  Barriers to Discharge: Home environment access/layout  Barriers to Discharge Comments: stairs to enter   Decrease burden of Care through IP rehab admission: NA   Possible need for SNF placement upon discharge:Not anticipated; pt has good social support form adult son who is present during the day and can assist as needed.    Patient Condition: This patient's medical and functional status has changed since the consult dated: 01/19/2019 in which the Rehabilitation Physician determined and documented that the patient's condition is appropriate for intensive rehabilitative care in an inpatient rehabilitation facility. See "History of Present Illness" (above) for medical update. Functional changes are:  improvement in most ADL areas from Max A to Min A/Min G. Patient's medical and functional status update has been discussed with the Rehabilitation physician and patient remains appropriate for inpatient rehabilitation. Will admit to inpatient rehab today.  Preadmission Screen Completed By:  Raechel Ache, OT, 01/22/2019 2:31 PM ______________________________________________________________________   Discussed status with Dr. Ranell Patrick on 01/22/2019 at 2:31PM and received approval for admission today.  Admission Coordinator:  Raechel Ache, time 2:31PM/Date11/23/2020

## 2019-01-22 NOTE — Social Work (Signed)
CSW acknowledging consult for "pt needs inpatient rehab treatment." Per notes CIR is following for appropriateness. CSW will also follow for potential SNF placement.  Will follow for therapy recommendations needed to best determine disposition/for insurance authorization.   Westley Hummer, MSW, [place] Work [phone]

## 2019-01-22 NOTE — H&P (Addendum)
Physical Medicine and Rehabilitation Admission H&P    No chief complaint on file. : HPI: Faith [name] is a 63 year old right-handed female with history of COPD/tobacco abuse, back pain, multiple sclerosis diagnosed 3 years ago.  Per chart review lives with her children.  1 level home 4 steps to entry.  She was able to transfer as well as ambulate independently with some increased time for task prior to admission.  Presented 01/17/2019 with progressive weakness.  MRI of the brain stable since 2019.  Advanced chronic demyelinating disease with no progressive or active demyelination identified.  No new intracranial abnormality.  MRI cervical spine despite the advanced chronic demyelinating disease the symptomatic abnormality appears to be degenerative in nature.  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  Associated acute exacerbation of the chronic facet arthropathy at C2-3 and 3-4.  Underlying chronic cervical cord demyelinating disease appears stable without progression.  Patient initially seen by neurology services suspect multiple sclerosis exacerbation started on Solu-Medrol.  On 01/19/2019 patient patient reporting improvement paresthesia continued to have weakness.  Neurosurgery consulted to review MRI cervical spine noting spinal cord compression/synovial cyst with noted cervical spondylolisthesis and patient underwent posterior cervical laminectomy for synovial cyst resection 01/19/2019 per Dr. Christella Noa.  Hospital course pain management.  No cervical brace required.  Her IV Solu-Medrol was discontinued 01/20/2019 as MS exacerbation was ruled out.  Patient is tolerating a regular diet.  Therapy evaluations completed and patient was admitted for a comprehensive rehab program  Review of Systems  Constitutional: Negative for chills and fever.  HENT: Negative for  hearing loss.   Eyes: Negative for blurred vision and double vision.  Respiratory: Negative for cough and shortness of breath.   Cardiovascular: Positive for leg swelling. Negative for chest pain and palpitations.  Gastrointestinal: Positive for constipation. Negative for heartburn, nausea and vomiting.  Genitourinary: Positive for urgency. Negative for dysuria, flank pain and hematuria.  Musculoskeletal: Positive for joint pain.  Skin: Negative for rash.  Neurological: Positive for dizziness, sensory change and weakness.  Psychiatric/Behavioral: The patient has insomnia.   All other systems reviewed and are negative.  Past Medical History:  Diagnosis Date  . Anemia    my whole life, Used to take iron  . Arthritis    spine, lumbar  . COPD (chronic obstructive pulmonary disease) ([place])   . Emphysema of lung ([place])   . Narcolepsy    by history, has nightmares  . Neuromuscular disorder ([place])    leg issues from spine   Past Surgical History:  Procedure Laterality Date  . EYE SURGERY Right   . ORIF FACIAL FRACTURE    . TUBAL LIGATION    . tubaligation     Family History  Problem Relation Age of Onset  . Heart attack Mother   . Kidney failure Mother   . Diabetes Mother   . Heart disease Mother   . Hyperlipidemia Mother   . Hypertension Mother   . Heart attack Father 73  . Hypertension Father   . Hypertension Sister   . Diabetes Sister   . Other Brother        house fire  . Hypertension Brother   . Seizures Brother   . Heart disease Brother        mitral valve  . Arthritis Daughter        DJD, AS fibromyalgia  . Polymyositis Daughter   .  Cancer Maternal Grandfather        lung  . Heart disease Maternal Grandfather   . Other Paternal Grandmother        house fire  . Alcohol abuse Son    Social History:  reports that she has been smoking. She has a 11.25 pack-year smoking history. She has never used smokeless tobacco. She reports current alcohol use of about 2.0  standard drinks of alcohol per week. She reports that she does not use drugs. Allergies:  Allergies  Allergen Reactions  . Darvon [Propoxyphene] Other (See Comments)    Hallicuations  . Penicillins Rash    Did it involve swelling of the face/tongue/throat, SOB, or low BP? Unknown Did it involve sudden or severe rash/hives, skin peeling, or any reaction on the inside of your mouth or nose? Unknown Did you need to seek medical attention at a hospital or doctor's office? Unknown When did it last happen?Unknown If all above answers are "NO", may proceed with cephalosporin use.    Medications Prior to Admission  Medication Sig Dispense Refill  . celecoxib (CELEBREX) 200 MG capsule Take 1 capsule (200 mg total) by mouth every 12 (twelve) hours.    . Cholecalciferol (VITAMIN D) 50 MCG (2000 UT) CAPS Take 2,000 Units by mouth 2 (two) times daily.     . diazepam (VALIUM) 5 MG tablet Take 1 tablet (5 mg total) by mouth every 6 (six) hours as needed for muscle spasms. 30 tablet 0  . gabapentin (NEURONTIN) 300 MG capsule Take 2 capsules (600 mg total) by mouth 3 (three) times daily.    . IBU 800 MG tablet TAKE (1) TABLET BY MOUTH EVERY EIGHT HOURS AS NEEDED. (Patient taking differently: Take 800 mg by mouth every 8 (eight) hours as needed for mild pain or moderate pain. ) 90 tablet 1  . nicotine (NICODERM CQ - DOSED IN MG/24 HR) 7 mg/24hr patch Place 1 patch (7 mg total) onto the skin daily as needed (smoking cessation). 28 patch 0  . ondansetron (ZOFRAN) 4 MG tablet Take 1 tablet (4 mg total) by mouth every 6 (six) hours as needed for nausea or vomiting. 20 tablet 0  . oxyCODONE 10 MG TABS Take 1 tablet (10 mg total) by mouth every 3 (three) hours as needed for severe pain ((score 7 to 10)). 30 tablet 0  . propranolol (INDERAL) 60 MG tablet Take 60 mg by mouth 2 (two) times daily.     . traMADol (ULTRAM) 50 MG tablet Take 100 mg by mouth every 6 (six) hours as needed for moderate pain.        Drug Regimen Review Drug regimen was reviewed and remains appropriate with no significant issues identified    Physical Exam: Blood pressure (!) 160/100, pulse 67, temperature (!) 97.4 F (36.3 C), temperature source Oral, resp. rate 18, height 5\' 6"  (1.676 m), weight 43.5 kg, SpO2 97 %. Home: Home Living Family/patient expects to be discharged to:: Private residence Living Arrangements: Children Available Help at Discharge: Family Type of Home: House Home Access: Stairs to enter Technical brewer of Steps: 4 Entrance Stairs-Rails: Left Home Layout: One level Bathroom Shower/Tub: Multimedia programmer: Standard Bathroom Accessibility: No Home Equipment: Environmental consultant - 4 wheels, Shower seat, Hospital bed   Functional History: Prior Function Level of Independence: Needs assistance Gait / Transfers Assistance Needed: Able to transfer/ambulate independently but with increased time and effort ADL's / Homemaking Assistance Needed: Needs assistance- son helps her reach cabinets, do dishes, etc.  Communication / Swallowing Assistance Needed: Independent  Functional Status:  Mobility: Bed Mobility Overal bed mobility: Needs Assistance Bed Mobility: Rolling, Sidelying to Sit Rolling: Min guard Sidelying to sit: Min guard Supine to sit: Min guard Sit to supine: Min assist General bed mobility comments: min guard for safety, cueing for log roll technique to protect her cervical spine Transfers Overall transfer level: Needs assistance Equipment used: Rolling walker (2 wheeled) Transfers: Sit to/from Stand, [employer] Transfers Sit to Stand: Mod assist Stand pivot transfers: Mod assist General transfer comment: good technique, assistance needed to power into standing and for transitional movement from bed to [facility] and then to recliner chair; pt with great difficulty advancing R LE forwards and very ataxic with movement Ambulation/Gait Ambulation/Gait assistance: Mod  assist, +2 safety/equipment Gait Distance (Feet): 35 Feet Assistive device: Rolling walker (2 wheeled) Gait Pattern/deviations: Step-to pattern, Decreased step length - right, Decreased step length - left, Shuffle, Ataxic General Gait Details: attempted ambulation; however, unsafe as pt was very ataxic and unsteady, required mod A to take pivotal side steps to recliner chair Gait velocity: decr  ADL: ADL Overall ADL's : Needs assistance/impaired Eating/Feeding: Set up, Minimal assistance, Sitting Eating/Feeding Details (indicate cue type and reason): Issued weighted utensils and plate guard as pt reports she has been struggling with feeding tasks Grooming: Sitting, Maximal assistance Upper Body Bathing: Sitting, Maximal assistance Lower Body Bathing: Sit to/from stand, Maximal assistance Upper Body Dressing : Sitting, Maximal assistance Lower Body Dressing: Maximal assistance, Sit to/from stand Toilet Transfer: Moderate assistance, +2 for safety/equipment, Stand-pivot, RW Toilet Transfer Details (indicate cue type and reason): simulated with recliner to EOB. Pt using walker vs bed rail. Assist to power up, steady and pivot fully.  Toileting- Clothing Manipulation and Hygiene: Moderate assistance, Maximal assistance, Sit to/from stand, Sitting/lateral lean General ADL Comments: Simulated toilet transfer (recliner to EOB) and bed mobility completed during session. Pt relying on gross strength and momentum to facilitate transfers and bed mobility.   Cognition: Cognition Overall Cognitive Status: Within Functional Limits for tasks assessed Orientation Level: Oriented X4 Cognition Arousal/Alertness: Awake/alert Behavior During Therapy: WFL for tasks assessed/performed, Anxious Overall Cognitive Status: Within Functional Limits for tasks assessed  Results for orders placed or performed during the hospital encounter of 01/17/19 (from the past 48 hour(s))  CBC     Status: None   Collection  Time: 01/21/19  3:07 AM  Result Value Ref Range   WBC 10.5 4.0 - 10.5 K/uL   RBC 4.33 3.87 - 5.11 MIL/uL   Hemoglobin 14.6 12.0 - 15.0 g/dL   HCT 43.0 36.0 - 46.0 %   MCV 99.3 80.0 - 100.0 fL   MCH 33.7 26.0 - 34.0 pg   MCHC 34.0 30.0 - 36.0 g/dL   RDW 12.3 11.5 - 15.5 %   Platelets 239 150 - 400 K/uL   nRBC 0.0 0.0 - 0.2 %    Comment: Performed at [facility], [place] [address], [place], [place] [id number]  Basic metabolic panel     Status: Abnormal   Collection Time: 01/21/19  3:07 AM  Result Value Ref Range   Sodium 140 135 - 145 mmol/L   Potassium 3.2 (L) 3.5 - 5.1 mmol/L   Chloride 104 98 - 111 mmol/L   CO2 28 22 - 32 mmol/L   Glucose, Bld 88 70 - 99 mg/dL   BUN 9 8 - 23 mg/dL   Creatinine, Ser 0.54 0.44 - 1.00 mg/dL   Calcium 8.6 (L) 8.9 -  10.3 mg/dL   GFR calc non Af Amer >60 >60 mL/min   GFR calc Af Amer >60 >60 mL/min   Anion gap 8 5 - 15    Comment: Performed at [facility] [address], [place], [place] [id number]  Basic metabolic panel     Status: Abnormal   Collection Time: 01/22/19  6:11 AM  Result Value Ref Range   Sodium 139 135 - 145 mmol/L   Potassium 4.1 3.5 - 5.1 mmol/L   Chloride 105 98 - 111 mmol/L   CO2 25 22 - 32 mmol/L   Glucose, Bld 87 70 - 99 mg/dL   BUN 7 (L) 8 - 23 mg/dL   Creatinine, Ser 0.49 0.44 - 1.00 mg/dL   Calcium 8.9 8.9 - 10.3 mg/dL   GFR calc non Af Amer >60 >60 mL/min   GFR calc Af Amer >60 >60 mL/min   Anion gap 9 5 - 15    Comment: Performed at [facility], [place] [address] [place], [place] [address]  CBC     Status: Abnormal   Collection Time: 01/22/19  6:11 AM  Result Value Ref Range   WBC 9.2 4.0 - 10.5 K/uL   RBC 4.57 3.87 - 5.11 MIL/uL   Hemoglobin 15.5 (H) 12.0 - 15.0 g/dL   HCT 45.4 36.0 - 46.0 %   MCV 99.3 80.0 - 100.0 fL   MCH 33.9 26.0 - 34.0 pg   MCHC 34.1 30.0 - 36.0 g/dL   RDW 12.4 11.5 - 15.5 %   Platelets 235 150 - 400 K/uL   nRBC 0.0 0.0 - 0.2 %    Comment: Performed at [facility], [place] [address] [place],  [address]   No results found.   Medical Problem List and Plan: 1.  Decrease in strength, mobility and coordination secondary to cervical synovial cyst, spinal cord compression, cervical spondylolisthesis with radiculopathy C3-4.S/P posterior cervical laminectomy for synovial cyst resection, posterior cervical arthrodesis with anterior instrumentation 01/19/2019.  No cervical brace required  -Will discuss with surgery team to determine when patient may shower.  -ELOS/Goals: Mod I PT and OT, I SLP 2.  Antithrombotics: -DVT/anticoagulation: SCDs.  Check vascular study  -antiplatelet therapy: N/A 3. Pain Management: Celebrex 200 mg every 12 hours, Neurontin 600 mg 3 times daily, oxycodone/tramadol and Valium as needed 4. Mood: Provide emotional support  -antipsychotic agents: N/A 5. Neuropsych: This patient is capable of making decisions on her own behalf. 6. Skin/Wound Care: Routine skin checks 7. Fluids/Electrolytes/Nutrition: Routine in and outs with follow-up chemistries 8.  Multiple sclerosis.  Diagnosed 3 years ago.  Neurology services.  Continue Inderal 60 mg twice daily. Recommend starting Baclofen 5mg  TID for spasticity.  9.  Tobacco abuse.  NicoDerm patch 10.  Neurogenic bowel bladder.  Modified bowel program.  Check PVR  Cathlyn Parsons, PA-C 01/22/2019   I have personally performed a face to face diagnostic evaluation, including, but not limited to relevant history and physical exam findings, of this patient and developed relevant assessment and plan.  Additionally, I have reviewed and concur with the physician assistant's documentation above.  The patient's status has not changed. The original post admission physician evaluation remains appropriate, and any changes from the pre-admission screening or documentation from the acute chart are noted above.   Leeroy Cha, MD

## 2019-01-22 NOTE — Progress Notes (Signed)
Patient arrived to room 4MW5 via bed from [phone]. Patient is alert and oriented times 4. Belongings and call bell within reach. Bed is in the lowest and locked position with bed rails up times 3. VSS. NAD. All needs met at this time.

## 2019-01-22 NOTE — Progress Notes (Signed)
Inpatient Rehab Admissions:  Inpatient Rehab Consult received.  I met with pt at the bedside for rehabilitation assessment and to discuss goals and expectations of an inpatient rehab admission. Pt interested in CIR and would like to pursue at this time. I have confirmed assistance at DC from her son and discussed the program with him as well via phone. Feel pt is a great candidate for CIR.   I have paged Dr. Reesa Chew. Await medical clearance for possible admit.   Jhonnie Garner, OTR/L  Rehab Admissions Coordinator  [phone] 01/22/2019 11:07 AM

## 2019-01-22 NOTE — Progress Notes (Signed)
[name], MD  Physician  Physical Medicine and Rehabilitation  Consult Note  Signed  Date of Service:  01/19/2019  5:25 AM      Related encounter: ED to Hosp-Admission (Discharged) from 01/17/2019 in [place] Collapse All            Physical Medicine and Rehabilitation Consult Reason for Consult: Decreased functional mobility Referring Physician: [facility]     HPI: Faith [name] is a 63 y.o. right-handed female with history of COPD/tobacco abuse, back pain, multiple sclerosis diagnosed 3 years ago.  Per chart review patient lives with her children.  1 level home 4 steps to entry.  She was able to transfer ambulate independently with some increased time for tasks prior to admission.  Presented 01/17/2019 with progressive weakness.  MRI cervical spine and brain are pending.  Suspect MS exacerbation placed on IV Solu-Medrol 3 to 5 days.  Subcutaneous Lovenox for DVT prophylaxis.  Tolerating a regular diet.  Therapy evaluations completed recommendations of physical medicine rehab consult.     Review of Systems  Constitutional: Negative for chills and fever.  HENT: Negative for hearing loss.   Eyes: Positive for blurred vision. Negative for double vision.  Respiratory: Negative for cough and shortness of breath.   Cardiovascular: Positive for leg swelling. Negative for chest pain and palpitations.  Gastrointestinal: Positive for constipation. Negative for heartburn and nausea.  Genitourinary: Negative for dysuria, flank pain and hematuria.  Musculoskeletal: Positive for back pain and myalgias.  Skin: Negative for rash.  Neurological: Positive for weakness.  All other systems reviewed and are negative.       Past Medical History:  Diagnosis Date  . Anemia      my whole life, Used to take iron  . Arthritis      spine, lumbar  . COPD (chronic obstructive pulmonary disease) ([place])    . Emphysema of lung  ([place])    . Narcolepsy      by history, has nightmares  . Neuromuscular disorder ([place])      leg issues from spine         Past Surgical History:  Procedure Laterality Date  . EYE SURGERY Right    . ORIF FACIAL FRACTURE      . TUBAL LIGATION      . tubaligation             Family History  Problem Relation Age of Onset  . Heart attack Mother    . Kidney failure Mother    . Diabetes Mother    . Heart disease Mother    . Hyperlipidemia Mother    . Hypertension Mother    . Heart attack Father 34  . Hypertension Father    . Hypertension Sister    . Diabetes Sister    . Other Brother          house fire  . Hypertension Brother    . Seizures Brother    . Heart disease Brother          mitral valve  . Arthritis Daughter          DJD, AS fibromyalgia  . Polymyositis Daughter    . Cancer Maternal Grandfather          lung  . Heart disease Maternal Grandfather    . Other Paternal Grandmother  house fire  . Alcohol abuse Son      Social History:  reports that she has been smoking. She has a 11.25 pack-year smoking history. She has never used smokeless tobacco. She reports current alcohol use of about 2.0 standard drinks of alcohol per week. She reports that she does not use drugs. Allergies:       Allergies  Allergen Reactions  . Darvon [Propoxyphene] Other (See Comments)      Hallicuations  . Penicillins Rash      Did it involve swelling of the face/tongue/throat, SOB, or low BP? Unknown Did it involve sudden or severe rash/hives, skin peeling, or any reaction on the inside of your mouth or nose? Unknown Did you need to seek medical attention at a hospital or doctor's office? Unknown When did it last happen?      Unknown If all above answers are "NO", may proceed with cephalosporin use.            Medications Prior to Admission  Medication Sig Dispense Refill  . Cholecalciferol (VITAMIN D) 50 MCG (2000 UT) CAPS Take 2,000 Units by mouth 2 (two) times daily.        Marland Kitchen gabapentin (NEURONTIN) 300 MG capsule Take 1 capsule (300 mg total) by mouth 3 (three) times daily. start with one at bedtime.  Increase as tolerated to TID (Patient taking differently: Take 600 mg by mouth 3 (three) times daily. ) 180 capsule 3  . IBU 800 MG tablet TAKE (1) TABLET BY MOUTH EVERY EIGHT HOURS AS NEEDED. (Patient taking differently: Take 800 mg by mouth every 8 (eight) hours as needed for mild pain or moderate pain. ) 90 tablet 1  . propranolol (INDERAL) 60 MG tablet Take 60 mg by mouth 2 (two) times daily.       . traMADol (ULTRAM) 50 MG tablet Take 100 mg by mouth every 6 (six) hours as needed for moderate pain.           Home: Home Living Family/patient expects to be discharged to:: (P) Private residence Living Arrangements: (P) Children Available Help at Discharge: (P) Family Type of Home: (P) [place] Access: (P) Stairs to enter Entrance Stairs-Number of Steps: (P) 4 Entrance Stairs-Rails: (P) Left Home Layout: (P) One level Bathroom Shower/Tub: (P) Walk-in shower Bathroom Toilet: (P) Standard Bathroom Accessibility: (P) No Home Equipment: (P) Walker - 4 wheels, Shower seat, Hospital bed  Functional History: Prior Function Level of Independence: (P) Needs assistance Gait / Transfers Assistance Needed: (P) Able to transfer/ambulate independently but with increased time and effort ADL's / Homemaking Assistance Needed: (P) Needs assistance- son helps her reach cabinets, do dishes, etc. Communication / Swallowing Assistance Needed: (P) Independent Functional Status:  Mobility: Bed Mobility Overal bed mobility: (P) Needs Assistance Bed Mobility: (P) Sit to Supine Supine to sit: Min guard Sit to supine: (P) Min assist General bed mobility comments: (P) assist to guide BLE completely onto bed. Pt relying on gross strength and momentum. Transfers Overall transfer level: (P) Needs assistance Equipment used: (P) Rolling walker (2 wheeled) Transfers: (P) Sit  to/from Stand, Stand Pivot Transfers Sit to Stand: (P) Mod assist Stand pivot transfers: (P) Mod assist, +2 safety/equipment General transfer comment: (P) rw vs bed rail utilized. Pt relying on gross strength and momentum. Assist to steady and control descent.  Ambulation/Gait Ambulation/Gait assistance: Mod assist, +2 safety/equipment Gait Distance (Feet): 2 Feet Assistive device: Rolling walker (2 wheeled) Gait Pattern/deviations: Step-to pattern, Decreased step length - right, Decreased step  length - left, Shuffle General Gait Details: uncoordinated gait, demonstrated LE weakness and fatigued after 2 ft, had one episode of LOB Gait velocity: decr   ADL: ADL Overall ADL's : (P) Needs assistance/impaired Eating/Feeding: (P) Set up, Minimal assistance, Sitting Eating/Feeding Details (indicate cue type and reason): (P) Issued weighted utensils and plate guard as pt reports she has been struggling with feeding tasks Grooming: (P) Sitting, Maximal assistance Upper Body Bathing: (P) Sitting, Maximal assistance Lower Body Bathing: (P) Sit to/from stand, Maximal assistance Upper Body Dressing : (P) Sitting, Maximal assistance Lower Body Dressing: (P) Maximal assistance, Sit to/from stand Toilet Transfer: (P) Moderate assistance, +2 for safety/equipment, Stand-pivot, RW Toilet Transfer Details (indicate cue type and reason): (P) simulated with recliner to EOB. Pt using walker vs bed rail. Assist to power up, steady and pivot fully.  Toileting- Clothing Manipulation and Hygiene: (P) Moderate assistance, Maximal assistance, Sit to/from stand, Sitting/lateral lean General ADL Comments: (P) Simulated toilet transfer (recliner to EOB) and bed mobility completed during session. Pt relying on gross strength and momentum to facilitate transfers and bed mobility.    Cognition: Cognition Overall Cognitive Status: (P) Within Functional Limits for tasks assessed Orientation Level: Oriented X4 Cognition  Arousal/Alertness: (P) Awake/alert Behavior During Therapy: (P) WFL for tasks assessed/performed, Anxious Overall Cognitive Status: (P) Within Functional Limits for tasks assessed   Blood pressure (!) 146/89, pulse 71, temperature 97.7 F (36.5 C), temperature source Oral, resp. rate 17, height 5\' 6"  (1.676 m), weight 45.4 kg, SpO2 94 %. Physical Exam  Neurological:  Patient is alert.  Speech is a bit dysarthric but intelligible.  Follows commands.  Oriented x3.  Gen: no distress, normal appearing HEENT: oral mucosa pink and moist, NCAT Cardio: Reg rate Chest: normal effort, normal rate of breathing Abd: soft, non-distended Ext: no edema Skin: intact Neuro/Musculoskeletal: AOx2. Diffusely hyperreflexic with significantly increased tone throughout--most severe at elbow and knee flexion bilaterally.  RUE: 4/5 in SA, EE, EF, and WF, 3/5 in hand grip. Decreased sensation throughout right upper extremity.  RLE: 2/5 HF, 4/5 KE, DF, PF LUE: 4/5 throughout LLE: 2/5 HF, 4/5 KE, DF, PF Psych: pleasant, normal affect   Lab Results Last 24 Hours  Results for orders placed or performed during the hospital encounter of 01/17/19 (from the past 24 hour(s))  CBC with Differential     Status: Abnormal    Collection Time: 01/17/19  5:17 PM  Result Value Ref Range    WBC 8.3 4.0 - 10.5 K/uL    RBC 4.75 3.87 - 5.11 MIL/uL    Hemoglobin 16.4 (H) 12.0 - 15.0 g/dL    HCT 47.7 (H) 36.0 - 46.0 %    MCV 100.4 (H) 80.0 - 100.0 fL    MCH 34.5 (H) 26.0 - 34.0 pg    MCHC 34.4 30.0 - 36.0 g/dL    RDW 12.4 11.5 - 15.5 %    Platelets 333 150 - 400 K/uL    nRBC 0.0 0.0 - 0.2 %    Neutrophils Relative % 65 %    Neutro Abs 5.3 1.7 - 7.7 K/uL    Lymphocytes Relative 26 %    Lymphs Abs 2.2 0.7 - 4.0 K/uL    Monocytes Relative 9 %    Monocytes Absolute 0.8 0.1 - 1.0 K/uL    Eosinophils Relative 0 %    Eosinophils Absolute 0.0 0.0 - 0.5 K/uL    Basophils Relative 0 %    Basophils Absolute 0.0 0.0 - 0.1 K/uL  Immature Granulocytes 0 %    Abs Immature Granulocytes 0.03 0.00 - 0.07 K/uL  Basic metabolic panel     Status: Abnormal    Collection Time: 01/17/19  5:17 PM  Result Value Ref Range    Sodium 137 135 - 145 mmol/L    Potassium 4.1 3.5 - 5.1 mmol/L    Chloride 103 98 - 111 mmol/L    CO2 25 22 - 32 mmol/L    Glucose, Bld 110 (H) 70 - 99 mg/dL    BUN 13 8 - 23 mg/dL    Creatinine, Ser 0.79 0.44 - 1.00 mg/dL    Calcium 8.9 8.9 - 10.3 mg/dL    GFR calc non Af Amer >60 >60 mL/min    GFR calc Af Amer >60 >60 mL/min    Anion gap 9 5 - 15  Urinalysis, Routine w reflex microscopic     Status: None    Collection Time: 01/18/19 12:45 PM  Result Value Ref Range    Color, Urine YELLOW YELLOW    APPearance CLEAR CLEAR    Specific Gravity, Urine 1.011 1.005 - 1.030    pH 6.0 5.0 - 8.0    Glucose, UA NEGATIVE NEGATIVE mg/dL    Hgb urine dipstick NEGATIVE NEGATIVE    Bilirubin Urine NEGATIVE NEGATIVE    Ketones, ur NEGATIVE NEGATIVE mg/dL    Protein, ur NEGATIVE NEGATIVE mg/dL    Nitrite NEGATIVE NEGATIVE    Leukocytes,Ua NEGATIVE NEGATIVE     Imaging Results (Last 48 hours)  No results found.       Assessment/Plan: Diagnosis: Impaired mobility and ADLs secondary to MS exacerbation.  1. Does the need for close, 24 hr/day medical supervision in concert with the patient's rehab needs make it unreasonable for this patient to be served in a less intensive setting? Yes 2. Co-Morbidities requiring supervision/potential complications: COPD, back pain, tobacco use.  3. Due to safety, skin/wound care, disease management, medication administration, pain management and patient education, does the patient require 24 hr/day rehab nursing? Yes 4. Does the patient require coordinated care of a physician, rehab nurse, therapy disciplines of PT, OT to address physical and functional deficits in the context of the above medical diagnosis(es)? Yes Addressing deficits in the following areas: balance,  endurance, locomotion, strength, transferring, bathing, dressing, feeding, grooming, toileting, cognition and psychosocial support 5. Can the patient actively participate in an intensive therapy program of at least 3 hrs of therapy per day at least 5 days per week? Yes 6. The potential for patient to make measurable gains while on inpatient rehab is excellent 7. Anticipated functional outcomes upon discharge from inpatient rehab are modified independent  with PT, modified independent with OT, independent with SLP. 8. Estimated rehab length of stay to reach the above functional goals is: 10-14 days 9. Anticipated discharge destination: Home 10. Overall Rehab/Functional Prognosis: excellent   RECOMMENDATIONS: This patient's condition is appropriate for continued rehabilitative care in the following setting: CIR Patient has agreed to participate in recommended program. Yes Note that insurance prior authorization may be required for reimbursement for recommended care.   Comment: Mr.s Faith [name] is a 63 year old female suffering from MS exacerbation. She has impaired mobility and ADLs secondary to weakness and spasticity and would benefit from inpatient rehabilitation. 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 Can consider spine surgical consult given new spinal cord mass effect  with weakness, hyperreflexia, and sensory abnormalities. Unclear how much of these symptoms are from MS vs cervical spine progressive degeneration. She has good social support at home, where she lives with her son and 59-year-old grandson. She has been sleeping well and had a BM the day before yesterday. She would benefit from Baclofen for her spasticity--can start 5mg  TID and can uptitrate if well tolerated. Thank you for this consult.    Lavon Paganini Angiulli, PA-C 01/18/2019    I have  personally performed a face to face diagnostic evaluation, including, but not limited to relevant history and physical exam findings, of this patient and developed relevant assessment and plan.  Additionally, I have reviewed and concur with the physician assistant's documentation above.   Leeroy Cha, MD        Revision History      Routing History

## 2019-01-22 NOTE — Discharge Summary (Signed)
Physician Discharge Summary  Faith [name] [id number] DOB: [birth year] DOA: 01/17/2019  PCP: Doree Albee, MD  Admit date: 01/17/2019 Discharge date: 01/22/2019  Admitted From: Home Disposition:  [facility]IR  Recommendations for Outpatient Follow-up:  1. Follow up with PCP in 1-2 weeks 2. Please obtain BMP/CBC in one week 3. Please follow up on the following pending results:  Home Health: No Equipment/Devices: None  Discharge Condition: Stable CODE STATUS: Full Diet recommendation:  Regular  Brief/Interim Summary: 63 y.o. femalewith medical history significant for Multiple Sclerosis, on ocrelizumab infusion as an outpatient, chronic tobacco abuse, chronic back pain, who is admitted to [facility] on 01/17/2019 with multiple sclerosis exacerbation after presenting from home to [facility] emergency department complaining of progressivesymmetricalweakness and diminished sensation involving the hands and feetover the course of the last month. Patient was started on high-dose steroid for a possible MS exacerbation.  Patient continued to experience worsening paresthesias and weakness, repeat MRI of brain and cervical spine shows demyelination, degenerative changes at C4 and C3, a bulky right-sided synovial cyst at C3-C4 level with spinal cord mass-effect.  Neurosurgery was consulted and she underwent posterior cervical laminectomy and removal of synovial cyst along with posterior cervical fusion.  Patient tolerated the procedure well.  Her paresthesias started improving after the surgery.  Solu-Medrol was discontinued as it was thought that her symptoms are not due to MS exacerbation but rather due to mass-effect and spinal cord compression.  Patient remained stable postoperatively and was advised to get inpatient rehab which she agrees.  Patient is being discharged to CIR for further rehab.  Discharge Diagnoses:  Principal Problem:   Multiple sclerosis exacerbation  ([place]) Active Problems:   Tobacco abuse   Falls frequently   Multiple sclerosis (HCC)   Chronic back pain   Spondylolisthesis, cervical region   Spinal cord compression due to degenerative disorder of spinal column   Synovial cyst  Discharge Instructions  Discharge Instructions    Diet - low sodium heart healthy   Complete by: As directed    Increase activity slowly   Complete by: As directed      Allergies as of 01/22/2019      Reactions   Darvon [propoxyphene] Other (See Comments)   Hallicuations   Penicillins Rash   Did it involve swelling of the face/tongue/throat, SOB, or low BP? Unknown Did it involve sudden or severe rash/hives, skin peeling, or any reaction on the inside of your mouth or nose? Unknown Did you need to seek medical attention at a hospital or doctor's office? Unknown When did it last happen?Unknown If all above answers are "NO", may proceed with cephalosporin use.      Medication List    TAKE these medications   celecoxib 200 MG capsule Commonly known as: CELEBREX Take 1 capsule (200 mg total) by mouth every 12 (twelve) hours.   diazepam 5 MG tablet Commonly known as: VALIUM Take 1 tablet (5 mg total) by mouth every 6 (six) hours as needed for muscle spasms.   gabapentin 300 MG capsule Commonly known as: Neurontin Take 2 capsules (600 mg total) by mouth 3 (three) times daily.   IBU 800 MG tablet Generic drug: ibuprofen TAKE (1) TABLET BY MOUTH EVERY EIGHT HOURS AS NEEDED. What changed: See the new instructions.   nicotine 7 mg/24hr patch Commonly known as: NICODERM CQ - dosed in mg/24 hr Place 1 patch (7 mg total) onto the skin daily as needed (smoking cessation).   ondansetron 4 MG  tablet Commonly known as: ZOFRAN Take 1 tablet (4 mg total) by mouth every 6 (six) hours as needed for nausea or vomiting.   Oxycodone HCl 10 MG Tabs Take 1 tablet (10 mg total) by mouth every 3 (three) hours as needed for severe pain ((score 7 to  10)).   propranolol 60 MG tablet Commonly known as: INDERAL Take 60 mg by mouth 2 (two) times daily.   traMADol 50 MG tablet Commonly known as: ULTRAM Take 100 mg by mouth every 6 (six) hours as needed for moderate pain.   Vitamin D 50 MCG (2000 UT) Caps Take 2,000 Units by mouth 2 (two) times daily.       Allergies  Allergen Reactions  . Darvon [Propoxyphene] Other (See Comments)    Hallicuations  . Penicillins Rash    Did it involve swelling of the face/tongue/throat, SOB, or low BP? Unknown Did it involve sudden or severe rash/hives, skin peeling, or any reaction on the inside of your mouth or nose? Unknown Did you need to seek medical attention at a hospital or doctor's office? Unknown When did it last happen?Unknown If all above answers are "NO", may proceed with cephalosporin use.     Consultations:  Neurology  Neurosurgery  Procedures/Studies: Dg Cervical Spine 1 View  Result Date: 01/19/2019 CLINICAL DATA:  Posterior cervical laminectomy, removal of synovial cyst and posterior cervical fusion EXAM: DG CERVICAL SPINE - 1 VIEW; DG C-ARM 1-60 MIN COMPARISON:  01/18/2019 lumbar spine MRI FLUOROSCOPY TIME:  Fluoroscopy Time:  0 minutes 14 seconds Number of Acquired Spot Images: 1 FINDINGS: Nondiagnostic single spot fluoroscopic lateral intraoperative cervical spine radiograph demonstrates postsurgical changes from bilateral posterior spinal fusion at C3-4. IMPRESSION: Intraoperative fluoroscopic guidance for posterior spinal fusion. Electronically Signed   By: Ilona Sorrel M.D.   On: 01/19/2019 20:59   Dg Cervical Spine Complete  Result Date: 01/19/2019 CLINICAL DATA:  ACDF C3-C4 EXAM: CERVICAL SPINE - COMPLETE 4+ VIEW COMPARISON:  Procedural fluoroscopy. FINDINGS: Multiple cross-table lateral spot views obtained in the operating room. Patient has prior C3-C4 posterior fusion. Images obtained surgical instruments localizing anteriorly. Subsequent anterior C3-C4  fusion. IMPRESSION: Cross-table lateral spot views obtained in the operating room during surgical localization and anterior C3-C4 fusion. Electronically Signed   By: Keith Rake M.D.   On: 01/19/2019 23:24   Mr Jeri Cos And Wo Contrast  Result Date: 01/18/2019 CLINICAL DATA:  63 year old female with chronic multiple sclerosis, admitted with progressive weakness and diminished extremity sensation. EXAM: MRI HEAD WITHOUT AND WITH CONTRAST TECHNIQUE: Multiplanar, multiecho pulse sequences of the brain and surrounding structures were obtained without and with intravenous contrast. CONTRAST:  38mL GADAVIST GADOBUTROL 1 MMOL/ML IV SOLN COMPARISON:  Brain MRI 03/16/2017 and earlier. FINDINGS: Brain: No restricted diffusion to suggest acute infarction. No midline shift, mass effect, evidence of mass lesion, ventriculomegaly, extra-axial collection or acute intracranial hemorrhage. Cervicomedullary junction and pituitary are within normal limits. Chronically advanced bilateral periventricular and central white matter T2/FLAIR signal abnormality and volume loss. Relative sparing of the subcortical white matter except in the superior frontal gyri. Compared to 2019 the cerebral white matter disease appears stable. Occasional mild areas of cortical involvement (posterior right superior frontal gyrus and mesial right temporal lobe) appear stable. Bilateral deep white matter capsule involvement appears stable, with otherwise sparing of the deep gray nuclei. The brainstem and cerebellum are also chronically spared. No lesions with restricted diffusion or enhancement are identified. No dural thickening or other abnormal intracranial enhancement. No chronic cerebral  blood products identified. Stable cerebral volume. Vascular: Major intracranial vascular flow voids are preserved. The major dural venous sinuses are enhancing and appear to be patent. Skull and upper cervical spine: Cervical spine reported separately today. Bulky  left frontal convexity hyperostosis of the skull or dural calcification is incidentally noted and unchanged. Normal background bone marrow signal. Sinuses/Orbits: Grossly negative orbits. Chronic left maxillary sinus disease, improved. Other paranasal sinuses are clear. Other: Mastoids are clear. Visible internal auditory structures appear normal. Scalp and face soft tissues appear negative. IMPRESSION: 1. Stable since 2019. Advanced chronic demyelinating disease with no progressive or active demyelination identified. 2. No new intracranial abnormality. 3. Cervical spine reported separately today. Electronically Signed   By: Genevie Ann M.D.   On: 01/18/2019 22:44   Mr Cervical Spine W Wo Contrast  Result Date: 01/18/2019 CLINICAL DATA:  63 year old female with chronic multiple sclerosis, admitted with progressive weakness and diminished extremity sensation. EXAM: MRI CERVICAL SPINE WITHOUT AND WITH CONTRAST TECHNIQUE: Multiplanar and multiecho pulse sequences of the cervical spine, to include the craniocervical junction and cervicothoracic junction, were obtained without and with intravenous contrast. CONTRAST:  41mL GADAVIST GADOBUTROL 1 MMOL/ML IV SOLN COMPARISON:  Brain MRI today reported separately. Cervical spine MRI 03/16/2017, 01/14/2016. FINDINGS: Alignment: Progressed degenerative appearing anterolisthesis of C3 on C4 since 2019, now measuring about 3 millimeters. Abnormal posterior elements at that level, see additional details below. Associated increased reversal of the normal cervical lordosis since 2019. Mild degenerative anterolisthesis at C2-C3 has also progressed. Vertebrae: Chronic abnormal marrow signal throughout the C4 vertebral body remain stable compatible with benign etiology. Continued degenerative appearing marrow edema in the right C3-C4 posterior elements, with associated enhancement. Similar new degenerative appearing marrow edema and enhancement in the left C2-C3 posterior elements. See  additional details below. Background bone marrow signal remains within normal limits. Cord: New degenerative spinal stenosis with spinal cord compression at C3-C4. See series 5, image 8. See additional details below. No definite associated abnormal cord signal. Although there does appear to be associated probably degenerative posterior dural thickening and enhancement (series 12, image 8). No abnormal intradural enhancement identified. Superimposed chronic demyelinating related cord signal abnormality redemonstrated in the right hemicord at the C4 level. Similar C5 and C6 level cord signal abnormality best seen on axial images. These appear stable. Posterior Fossa, vertebral arteries, paraspinal tissues: Brain MRI today reported separately. Preserved major vascular flow voids in the neck. Negative neck soft tissues. Negative visible right lung apex. Disc levels: C2-C3: New degenerative anterolisthesis at this level since 2019 with progressed chronic facet arthropathy, now severe. Left side degenerative facet enhancement and edema. No spinal stenosis. Severe left and mild to moderate right C3 foraminal stenosis appears increased. C3-C4: Progressed degenerative anterolisthesis and facet arthropathy at this level, now severe on the right. Degenerative right facet joint fluid, facet marrow edema and enhancement. Superimposed new bulky posteriorly situated synovial cyst on the right, 9 x 11 millimeters. See series 7, image 9 and series 8, image 7. Associated marginal enhancement of the cyst and adjacent dura on series 12, image 8. Subsequent new moderate spinal stenosis and cord mass effect. Moderate to severe bilateral C4 foraminal stenosis also appears progressed. C4-C5:  Stable mild to moderate facet hypertrophy. C5-C6: Stable disc bulge and endplate spurring without spinal stenosis. Mild to moderate C6 foraminal stenosis appears stable. C6-C7: Stable disc bulge and endplate spurring with mild ligament flavum  hypertrophy. Stable borderline to mild spinal stenosis. Moderate to severe left and moderate right C7  foraminal stenosis is stable. C7-T1: Stable right eccentric disc bulge. Moderate facet hypertrophy. No spinal stenosis, but moderate to severe chronic right C8 foraminal stenosis. Mild left C8 foraminal stenosis. This level stable. Moderate to severe T1 neural foraminal stenosis is chronic and greater on the left, mostly related to facet degeneration. IMPRESSION: 1. Despite the advanced chronic demyelinating disease the symptomatic abnormality appears to be degenerative in nature: Substantially progressed degenerative spondylolisthesis at C2-C3 and C3-C4 since 2019 with severe posterior element degeneration including a bulky right side synovial cyst at C3-C4. Subsequent new moderate spinal stenosis and spinal cord mass effect. See series 5, image 8. No associated cord edema or myelomalacia is evident. 2. Associated acute exacerbation of the chronic facet arthropathy at C2-C3 and C3-C4. Benign chronic abnormal marrow signal in the C4 vertebral body remains stable. 3. Degenerative changes elsewhere in the cervical spine are stable, including moderate and severe lower cervical and upper thoracic neural foraminal stenosis. 4. Underlying chronic cervical cord demyelinating disease appears stable without progression. Study discussed by telephone with NP Blunt on 01/18/2019 at 23:08 . Electronically Signed   By: Genevie Ann M.D.   On: 01/18/2019 23:11   Dg C-arm 1-60 Min  Result Date: 01/19/2019 CLINICAL DATA:  Posterior cervical laminectomy, removal of synovial cyst and posterior cervical fusion EXAM: DG CERVICAL SPINE - 1 VIEW; DG C-ARM 1-60 MIN COMPARISON:  01/18/2019 lumbar spine MRI FLUOROSCOPY TIME:  Fluoroscopy Time:  0 minutes 14 seconds Number of Acquired Spot Images: 1 FINDINGS: Nondiagnostic single spot fluoroscopic lateral intraoperative cervical spine radiograph demonstrates postsurgical changes from  bilateral posterior spinal fusion at C3-4. IMPRESSION: Intraoperative fluoroscopic guidance for posterior spinal fusion. Electronically Signed   By: Ilona Sorrel M.D.   On: 01/19/2019 20:59    Subjective: Patient was feeling better when seen this morning.  Her numbness is improving.  She was experiencing some discomfort around surgical site, back more than front.  She would like to go to CIR for further rehab.  Discharge Exam: Vitals:   01/22/19 0525 01/22/19 0805  BP: (!) 170/96   Pulse: (!) 59 68  Resp: 20   Temp: 97.9 F (36.6 C)   SpO2: 99%    Vitals:   01/21/19 2030 01/22/19 0019 01/22/19 0525 01/22/19 0805  BP: (!) 172/106 (!) 150/98 (!) 170/96   Pulse: 63 (!) 59 (!) 59 68  Resp: 17 18 20    Temp: 98.1 F (36.7 C) 98.4 F (36.9 C) 97.9 F (36.6 C)   TempSrc: Oral Oral Oral   SpO2: 99% 98% 99%   Weight:      Height:       General: Pt is alert, awake, not in acute distress Cardiovascular: RRR, S1/S2 +, no rubs, no gallops Respiratory: CTA bilaterally, no wheezing, no rhonchi Abdominal: Soft, NT, ND, bowel sounds + Extremities: no edema, no cyanosis   The results of significant diagnostics from this hospitalization (including imaging, microbiology, ancillary and laboratory) are listed below for reference.     Microbiology: Recent Results (from the past 240 hour(s))  SARS CORONAVIRUS 2 (TAT 6-24 HRS) Nasopharyngeal Nasopharyngeal Swab     Status: None   Collection Time: 01/17/19  9:00 PM   Specimen: Nasopharyngeal Swab  Result Value Ref Range Status   SARS Coronavirus 2 NEGATIVE NEGATIVE Final    Comment: (NOTE) SARS-CoV-2 target nucleic acids are NOT DETECTED. The SARS-CoV-2 RNA is generally detectable in upper and lower respiratory specimens during the acute phase of infection. Negative results do not preclude  SARS-CoV-2 infection, do not rule out co-infections with other pathogens, and should not be used as the sole basis for treatment or other patient  management decisions. Negative results must be combined with clinical observations, patient history, and epidemiological information. The expected result is Negative. Fact Sheet for Patients: SugarRoll.be Fact Sheet for Healthcare Providers: https://www.woods-mathews.com/ This test is not yet approved or cleared by the [place] FDA and  has been authorized for detection and/or diagnosis of SARS-CoV-2 by FDA under an Emergency Use Authorization (EUA). This EUA will remain  in effect (meaning this test can be used) for the duration of the COVID-19 declaration under Section 56 4(b)(1) of the Act, 21 U.S.C. section 360bbb-3(b)(1), unless the authorization is terminated or revoked sooner. Performed at [facility], [place] [address], [place], [place] [address]   MRSA PCR Screening     Status: None   Collection Time: 01/19/19  4:51 PM   Specimen: Nasopharyngeal  Result Value Ref Range Status   MRSA by PCR NEGATIVE NEGATIVE Final    Comment:        The GeneXpert MRSA Assay (FDA approved for NASAL specimens only), is one component of a comprehensive MRSA colonization surveillance program. It is not intended to diagnose MRSA infection nor to guide or monitor treatment for MRSA infections. Performed at [facility], [place] [address], [place], [place] [address]      Labs: BNP (last 3 results) No results for input(s): BNP in the last 8760 hours. Basic Metabolic Panel: Recent Labs  Lab 01/17/19 1717 01/20/19 0251 01/21/19 0307 01/22/19 0611  NA 137 135 140 139  K 4.1 3.5 3.2* 4.1  CL 103 101 104 105  CO2 25 25 28 25   GLUCOSE 110* 129* 88 87  BUN 13 13 9  7*  CREATININE 0.79 0.55 0.54 0.49  CALCIUM 8.9 8.7* 8.6* 8.9   Liver Function Tests: No results for input(s): AST, ALT, ALKPHOS, BILITOT, PROT, ALBUMIN in the last 168 hours. No results for input(s): LIPASE, AMYLASE in the last 168 hours. No results for input(s):  AMMONIA in the last 168 hours. CBC: Recent Labs  Lab 01/17/19 1717 01/20/19 0251 01/21/19 0307 01/22/19 0611  WBC 8.3 20.5* 10.5 9.2  NEUTROABS 5.3  --   --   --   HGB 16.4* 14.5 14.6 15.5*  HCT 47.7* 41.1 43.0 45.4  MCV 100.4* 97.2 99.3 99.3  PLT 333 266 239 235   Cardiac Enzymes: No results for input(s): CKTOTAL, CKMB, CKMBINDEX, TROPONINI in the last 168 hours. BNP: Invalid input(s): POCBNP CBG: No results for input(s): GLUCAP in the last 168 hours. D-Dimer No results for input(s): DDIMER in the last 72 hours. Hgb A1c No results for input(s): HGBA1C in the last 72 hours. Lipid Profile No results for input(s): CHOL, HDL, LDLCALC, TRIG, CHOLHDL, LDLDIRECT in the last 72 hours. Thyroid function studies Recent Labs    01/19/19 1444  TSH 0.951   Anemia work up Recent Labs    01/19/19 1444  VITAMINB12 443   Urinalysis    Component Value Date/Time   COLORURINE YELLOW 01/18/2019 [place] 01/18/2019 1245   LABSPEC 1.011 01/18/2019 1245   PHURINE 6.0 01/18/2019 [place] 01/18/2019 1245   [place] 01/18/2019 1245   [place] 01/18/2019 1245   [place] 01/18/2019 1245   PROTEINUR NEGATIVE 01/18/2019 1245   NITRITE NEGATIVE 01/18/2019 1245   LEUKOCYTESUR NEGATIVE 01/18/2019 1245   Sepsis Labs Invalid input(s): PROCALCITONIN,  WBC,  LACTICIDVEN Microbiology Recent Results (from the past 240 hour(s))  SARS CORONAVIRUS 2 (TAT 6-24 HRS) Nasopharyngeal Nasopharyngeal Swab     Status: None   Collection Time: 01/17/19  9:00 PM   Specimen: Nasopharyngeal Swab  Result Value Ref Range Status   SARS Coronavirus 2 NEGATIVE NEGATIVE Final    Comment: (NOTE) SARS-CoV-2 target nucleic acids are NOT DETECTED. The SARS-CoV-2 RNA is generally detectable in upper and lower respiratory specimens during the acute phase of infection. Negative results do not preclude SARS-CoV-2 infection, do not rule out co-infections with  other pathogens, and should not be used as the sole basis for treatment or other patient management decisions. Negative results must be combined with clinical observations, patient history, and epidemiological information. The expected result is Negative. Fact Sheet for Patients: SugarRoll.be Fact Sheet for Healthcare Providers: https://www.woods-mathews.com/ This test is not yet approved or cleared by the [place] FDA and  has been authorized for detection and/or diagnosis of SARS-CoV-2 by FDA under an Emergency Use Authorization (EUA). This EUA will remain  in effect (meaning this test can be used) for the duration of the COVID-19 declaration under Section 56 4(b)(1) of the Act, 21 U.S.C. section 360bbb-3(b)(1), unless the authorization is terminated or revoked sooner. Performed at [facility], [place] [address] [place], [place] [address]   MRSA PCR Screening     Status: None   Collection Time: 01/19/19  4:51 PM   Specimen: Nasopharyngeal  Result Value Ref Range Status   MRSA by PCR NEGATIVE NEGATIVE Final    Comment:        The GeneXpert MRSA Assay (FDA approved for NASAL specimens only), is one component of a comprehensive MRSA colonization surveillance program. It is not intended to diagnose MRSA infection nor to guide or monitor treatment for MRSA infections. Performed at [facility], [place] [address] [place], [place] [address]     Time coordinating discharge: Over 30 minutes  SIGNED:  Lorella Nimrod, MD  [facility] 01/22/2019, 2:13 PM Pager [phone]  If 7PM-7AM, please contact night-coverage [email or web address] Password [email or web address]  This record has been created using Systems analyst. Errors have been sought and corrected,but may not always be located. Such creation errors do not reflect on the standard of care.

## 2019-01-22 NOTE — Progress Notes (Signed)
Faith Ribas, MD  Physician  Physical Medicine and Rehabilitation  PMR Pre-admission  Signed  Date of Service:  01/22/2019 11:05 AM      Related encounter: ED to Hosp-Admission (Discharged) from 01/17/2019 in [facility]         PMR Admission Coordinator Pre-Admission Assessment  Patient: Faith [name] is an 63 y.o., female MRN: [id number] DOB: [birth year] Height: 5\' 6"  (167.6 cm) Weight: 45.4 kg                                                                                                                                                  Insurance Information HMO:     PPO:      PCP:      IPA:      80/20:      OTHER:  PRIMARY: Medicaid [place]      Policy#: [id number]      Subscriber: Patient CM Name:       Phone#:      Fax#:  Pre-Cert#:       Employer:  Coverage Code: MADCY Benefits:  Phone #: [phone]     Name: automated system  Eff. Date: verified eligibility on 01/22/2019     Deduct:       Out of Pocket Max:       Life Max:  CIR: Covered per Medicaid guidelines      SNF:  Outpatient:      Co-Pay:  Home Health:       Co-Pay:  DME:      Co-Pay:  Providers:  SECONDARY: None      Policy#:       Subscriber:  CM Name:       Phone#:      Fax#:  Pre-Cert#:       Employer:  Benefits:  Phone #:      Name:  Eff. Date:      Deduct:      Out of Pocket Max:       Life Max:  CIR:       SNF:  Outpatient:      Co-Pay:  Home Health:       Co-Pay: DME:      Co-Pay:   Medicaid Application Date:       Case Manager:  Disability Application Date:       Case Worker:   The Data Collection Information Summary for patients in Inpatient Rehabilitation Facilities with attached Privacy Act [place] Records was provided and verbally reviewed with: NA  Emergency Contact Information         Contact Information    Name Relation Home Work Mobile   Roland,Christina Daughter [phone]  [phone]    Calvert Cantor [phone]  [phone]     Current Medical History  Patient Admitting Diagnosis: cervical spondylolisthesis with accompanying  spinal cord compression/synovial cyst s/p resection of cyst.  History of Present Illness: Faith [name] is a 63 year old female with history of COPD/tobacco abuse, back pain, multiple sclerosis diagnosed 3 years ago. Per chart review lives with her children. 1 level home 4 steps to entry. She was able to transfer as well as ambulate independently with some increased time for task prior to admission. Presented 01/17/2019 with progressive weakness. MRI of the brain stable since 2019. Advanced chronic demyelinating disease with no progressive or active demyelination identified. No new intracranial abnormality. MRI cervical spine despite the advanced chronic demyelinating disease the symptomatic abnormality appears to be degenerative in nature. 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 Associated acute exacerbation of the chronic facet arthropathy at C2-3 and 3-4. Underlying chronic cervical cord demyelinating disease appears stable without progression. Patient initially seen by neurology services suspect multiple sclerosis exacerbation started on Solu-Medrol. On 01/19/2019 patient patient reporting improvement paresthesia continued to have weakness. Neurosurgery consulted to review MRI cervical spine noting spinal cord compression/synovial cyst with noted cervical spondylolisthesis and patient underwent posterior cervical laminectomy for synovial cyst resection 01/19/2019 per Dr. Christella Noa. Hospital course pain management.No cervical brace required.Her IV Solu-Medrol was discontinued 01/20/2019 as MS exacerbation was ruled out. Patient is tolerating a regular diet. Therapy evaluations completed and  patient is to be admitted for a comprehensive rehab program on 01/22/2019.  Past Medical History      Past Medical History:  Diagnosis Date   Anemia    my whole life, Used to take iron   Arthritis    spine, lumbar   COPD (chronic obstructive pulmonary disease) (HCC)    Emphysema of lung ([place])    Narcolepsy    by history, has nightmares   Neuromuscular disorder ([place])    leg issues from spine    Family History  family history includes Alcohol abuse in her son; Arthritis in her daughter; Cancer in her maternal grandfather; Diabetes in her mother and sister; Heart attack in her mother; Heart attack (age of onset: 52) in her father; Heart disease in her brother, maternal grandfather, and mother; Hyperlipidemia in her mother; Hypertension in her brother, father, mother, and sister; Kidney failure in her mother; Other in her brother and paternal grandmother; Polymyositis in her daughter; Seizures in her brother.  Prior Rehab/Hospitalizations:  Has the patient had prior rehab or hospitalizations prior to admission? No  Has the patient had major surgery during 100 days prior to admission? Yes  Current Medications   Current Facility-Administered Medications:    0.9 %  sodium chloride infusion, 250 mL, Intravenous, Continuous, Cabbell, Kyle, MD   0.9 % NaCl with KCl 20 mEq/ L  infusion, , Intravenous, Continuous, Ashok Pall, MD, Last Rate: 80 mL/hr at 01/22/19 0531   acetaminophen (TYLENOL) tablet 650 mg, 650 mg, Oral, Q6H PRN **OR** acetaminophen (TYLENOL) suppository 650 mg, 650 mg, Rectal, Q6H PRN, Ashok Pall, MD   celecoxib (CELEBREX) capsule 200 mg, 200 mg, Oral, Q12H, Cabbell, Kyle, MD, 200 mg at 01/22/19 0805   cholecalciferol (VITAMIN D3) tablet 2,000 Units, 2,000 Units, Oral, BID, Ashok Pall, MD, 2,000 Units at 01/22/19 0805   diazepam (VALIUM) tablet 5 mg, 5 mg, Oral, Q6H PRN, Ashok Pall, MD   gabapentin (NEURONTIN) capsule 600 mg, 600 mg,  Oral, TID, Ashok Pall, MD, 600 mg at 01/22/19 [id number]   menthol-cetylpyridinium (  CEPACOL) lozenge 3 mg, 1 lozenge, Oral, PRN **OR** phenol (CHLORASEPTIC) mouth spray 1 spray, 1 spray, Mouth/Throat, PRN, Ashok Pall, MD   morphine 2 MG/ML injection 1 mg, 1 mg, Intravenous, Q2H PRN, Ashok Pall, MD   nicotine (NICODERM CQ - dosed in mg/24 hr) patch 7 mg, 7 mg, Transdermal, Daily PRN, Ashok Pall, MD   ondansetron (ZOFRAN) tablet 4 mg, 4 mg, Oral, Q6H PRN **OR** ondansetron (ZOFRAN) injection 4 mg, 4 mg, Intravenous, Q6H PRN, Ashok Pall, MD   oxyCODONE (Oxy IR/ROXICODONE) immediate release tablet 10 mg, 10 mg, Oral, Q3H PRN, Ashok Pall, MD, 10 mg at 01/22/19 0524   propranolol (INDERAL) tablet 60 mg, 60 mg, Oral, BID, Ashok Pall, MD, 60 mg at 01/22/19 0806   sodium chloride flush (NS) 0.9 % injection 3 mL, 3 mL, Intravenous, Q12H, Ashok Pall, MD, 3 mL at 01/22/19 0811   sodium chloride flush (NS) 0.9 % injection 3 mL, 3 mL, Intravenous, Q12H, Cabbell, Marylyn Ishihara, MD, 3 mL at 01/22/19 [id number]   sodium chloride flush (NS) 0.9 % injection 3 mL, 3 mL, Intravenous, PRN, Ashok Pall, MD   traMADol Veatrice Bourbon) tablet 100 mg, 100 mg, Oral, Q6H PRN, Ashok Pall, MD, 100 mg at 01/21/19 1227  Patients Current Diet:     Diet Order                  Diet - low sodium heart healthy         Diet regular Room service appropriate? Yes; Fluid consistency: Thin  Diet effective now               Precautions / Restrictions Precautions Precautions: Fall, Cervical Precaution Booklet Issued: Yes (comment) Precaution Comments: reviewed log roll technique and all cervical precautions Restrictions Weight Bearing Restrictions: No   Has the patient had 2 or more falls or a fall with injury in the past year?Yes  Prior Activity Level Limited Community (1-2x/wk): did not work or drive; would get out of the house for MD appointments; uses Transport planner at grocery store.   Prior  Functional Level Prior Function Level of Independence: Needs assistance Gait / Transfers Assistance Needed: Able to transfer/ambulate independently but with increased time and effort ADL's / Homemaking Assistance Needed: Needs assistance- son helps her reach cabinets, do dishes, etc. Communication / Swallowing Assistance Needed: Independent  Self Care: Did the patient need help bathing, dressing, using the toilet or eating?  Independent  Indoor Mobility: Did the patient need assistance with walking from room to room (with or without device)? Independent  Stairs: Did the patient need assistance with internal or external stairs (with or without device)? Needed some help  Functional Cognition: Did the patient need help planning regular tasks such as shopping or remembering to take medications? [place] / [place] Devices/Equipment: Environmental consultant (specify type) Home Equipment: Walker - 4 wheels, Shower seat, Hospital bed  Prior Device Use: Indicate devices/aids used by the patient prior to current illness, exacerbation or injury? Rollator at home; shower chair, uese the store electric scooter for grocery shopping  Current Functional Level Cognition  Overall Cognitive Status: Within Functional Limits for tasks assessed Orientation Level: Oriented X4 General Comments: pleasant and agreeable to session    Extremity Assessment (includes Sensation/Coordination)  Upper Extremity Assessment: RUE deficits/detail, LUE deficits/detail RUE Deficits / Details: 3/5 gross strength. Right handed but currently using L hand to feed self. RUE Coordination: decreased fine motor, decreased gross motor LUE Deficits / Details: 3+/5 gross  strength. Pt reports she feels LUE is slightly stronger. R hand dominant but currently using L hand for feedind tasks LUE Coordination: decreased fine motor, decreased gross motor  Lower Extremity Assessment: Defer to PT  evaluation    ADLs  Overall ADL's : Needs assistance/impaired Eating/Feeding: Set up, Minimal assistance, Sitting Eating/Feeding Details (indicate cue type and reason): visual demo of use of rocker knife with pt return demonstrating with good carryover; MIN A to open lids and pour creamer into coffee Grooming: Wash/dry hands, Sitting, Set up Upper Body Bathing: Sitting, Maximal assistance Lower Body Bathing: Sit to/from stand, Maximal assistance Upper Body Dressing : Sitting, Maximal assistance Lower Body Dressing: Maximal assistance, Sit to/from stand Toilet Transfer: Ambulation, RW, Min guard, Stand-pivot, BSC Toilet Transfer Details (indicate cue type and reason): pt able to stand pivot to [employer] from EOB with min guard with RW, cues for hand placement. simualted to recliner with RW via functional mobility. Pt required MIN Guard for balance d/t pt ataxia Toileting- Clothing Manipulation and Hygiene: Set up, Sitting/lateral lean Toileting - Clothing Manipulation Details (indicate cue type and reason): pt able to complete anterior pericare via lateral leans with set- up asssit Functional mobility during ADLs: Min guard, Rolling walker General ADL Comments: pt able to take a couple steps to recliner this session with close min guard with RW. pt required close min guard for EOB grooming progressing to supervision with increaased time    Mobility  Overal bed mobility: Needs Assistance Bed Mobility: Rolling, Sidelying to Sit Rolling: Min guard Sidelying to sit: Min guard Supine to sit: Min guard Sit to supine: Min assist General bed mobility comments: min guard for safety, cueing for log roll technique to protect her cervical spine    Transfers  Overall transfer level: Needs assistance Equipment used: Rolling walker (2 wheeled) Transfers: Sit to/from Stand, [employer] Transfers Sit to Stand: Min guard Stand pivot transfers: Min guard General transfer comment: Min guard for safety  sit>stand with RW, MIN guard stand pivot to [facility], cues to hand placement.    Ambulation / Gait / Stairs / Wheelchair Mobility  Ambulation/Gait Ambulation/Gait assistance: Mod assist, +2 safety/equipment Gait Distance (Feet): 35 Feet Assistive device: Rolling walker (2 wheeled) Gait Pattern/deviations: Step-to pattern, Decreased step length - right, Decreased step length - left, Shuffle, Ataxic General Gait Details: attempted ambulation; however, unsafe as pt was very ataxic and unsteady, required mod A to take pivotal side steps to recliner chair Gait velocity: decr    Posture / Balance Dynamic Sitting Balance Sitting balance - Comments: pt initially required MIN guard- MIN assist progressing to supervision for EOB grooming Balance Overall balance assessment: Needs assistance Sitting-balance support: Feet supported, Bilateral upper extremity supported, Single extremity supported Sitting balance-Leahy Scale: Poor Sitting balance - Comments: pt initially required MIN guard- MIN assist progressing to supervision for EOB grooming Postural control: Other (comment)(no specific direction of lean, weak overall) Standing balance support: Bilateral upper extremity supported Standing balance-Leahy Scale: Poor Standing balance comment: BUE supported by RW     Special needs/care consideration BiPAP/CPAP: no CPM: no Continuous Drip IV: 0.9% NaCl with KCl 20 mEq/L infusion  Dialysis: no        Days: no Life Vest: no Oxygen: no Special Bed: no Trach Size : no Wound Vac (area): no      Location: no Skin: surgical incision to neck (Anterior and Posterior)  Bowel mgmt: continent, last BM 01/20/2019 Bladder mgmt: incontinent Diabetic mgmt: no Behavioral consideration : no Chemo/radiation : no     Previous Home Environment (from acute therapy documentation) Living Arrangements: Children Available Help at Discharge: Family Type of Home: House Home Layout: One  level Home Access: Stairs to enter Entrance Stairs-Rails: Left Entrance Stairs-Number of Steps: 4 Bathroom Shower/Tub: Multimedia programmer: Programmer, systems: No Home Care Services: No  Discharge Living Setting Plans for Discharge Living Setting: Patient's home, Lives with (comment)(son (age 52) and grandson (age 41)) Type of Home at Discharge: House Discharge Home Layout: One level Discharge Home Access: Stairs to enter Entrance Stairs-Rails: Left Entrance Stairs-Number of Steps: 3-4 steps Discharge Bathroom Shower/Tub: Walk-in shower Discharge Bathroom Toilet: Standard Discharge Bathroom Accessibility: Yes How Accessible: Accessible via walker(if sidesteps (will need instruction)) Does the patient have any problems obtaining your medications?: No  Social/Family/Support Systems Patient Roles: Other (Comment)(close family support) Contact Information: son: [name] (home [phone]; cell: [phone]) Anticipated Caregiver: son  Anticipated Caregiver's Contact Information: see above Ability/Limitations of Caregiver: Min A Caregiver Availability: 24/7 Discharge Plan Discussed with Primary Caregiver: Yes Is Caregiver In Agreement with Plan?: Yes Does Caregiver/Family have Issues with Lodging/Transportation while Pt is in Rehab?: No   Goals/Additional Needs Patient/Family Goal for Rehab: PT/OT: Mod I/Supervision; SLP: NA Expected length of stay: 10-14 days Cultural Considerations: NA Dietary Needs: regular diet, thin liquids Equipment Needs: TBD Special Service Needs: using AE equipment for feeding on acute Pt/Family Agrees to Admission and willing to participate: Yes Program Orientation Provided & Reviewed with Pt/Caregiver Including Roles  & Responsibilities: Yes  Barriers to Discharge: Home environment access/layout  Barriers to Discharge Comments: stairs to enter   Decrease burden of Care through IP rehab admission: NA   Possible  need for SNF placement upon discharge:Not anticipated; pt has good social support form adult son who is present during the day and can assist as needed.    Patient Condition: This patient's medical and functional status has changed since the consult dated: 01/19/2019 in which the Rehabilitation Physician determined and documented that the patient's condition is appropriate for intensive rehabilitative care in an inpatient rehabilitation facility. See "History of Present Illness" (above) for medical update. Functional changes are: improvement in most ADL areas from Max A to Min A/Min G. Patient's medical and functional status update has been discussed with the Rehabilitation physician and patient remains appropriate for inpatient rehabilitation. Will admit to inpatient rehab today.  Preadmission Screen Completed By:  Raechel Ache, OT, 01/22/2019 2:31 PM ______________________________________________________________________   Discussed status with Dr. Ranell Patrick on 01/22/2019 at 2:31PM and received approval for admission today.  Admission Coordinator:  Raechel Ache, time 2:31PM/Date11/23/2020         Revision History Date/Time User Provider Type Action  01/22/2019 3:16 PM Raulkar, Clide Deutscher, MD Physician Sign  01/22/2019 2:32 PM Raechel Ache, [place] [facility] Admission Coordinator Share  View Details Report

## 2019-01-23 ENCOUNTER — Inpatient Hospital Stay (HOSPITAL_COMMUNITY): Payer: Medicaid Other

## 2019-01-23 ENCOUNTER — Inpatient Hospital Stay (HOSPITAL_COMMUNITY): Payer: Medicaid Other | Admitting: Occupational Therapy

## 2019-01-23 DIAGNOSIS — M7989 Other specified soft tissue disorders: Secondary | ICD-10-CM

## 2019-01-23 DIAGNOSIS — G959 Disease of spinal cord, unspecified: Secondary | ICD-10-CM

## 2019-01-23 LAB — COMPREHENSIVE METABOLIC PANEL
ALT: 23 U/L (ref 0–44)
AST: 21 U/L (ref 15–41)
Albumin: 3.2 g/dL — ABNORMAL LOW (ref 3.5–5.0)
Alkaline Phosphatase: 41 U/L (ref 38–126)
Anion gap: 8 (ref 5–15)
BUN: 11 mg/dL (ref 8–23)
CO2: 24 mmol/L (ref 22–32)
Calcium: 8.9 mg/dL (ref 8.9–10.3)
Chloride: 104 mmol/L (ref 98–111)
Creatinine, Ser: 0.47 mg/dL (ref 0.44–1.00)
GFR calc Af Amer: >60 mL/min (ref >60)
GFR calc non Af Amer: >60 mL/min (ref >60)
Glucose, Bld: 104 mg/dL — ABNORMAL HIGH (ref 70–99)
Potassium: 3.8 mmol/L (ref 3.5–5.1)
Sodium: 136 mmol/L (ref 135–145)
Total Bilirubin: 0.9 mg/dL (ref 0.3–1.2)
Total Protein: 6 g/dL — ABNORMAL LOW (ref 6.5–8.1)

## 2019-01-23 LAB — CBC WITH DIFFERENTIAL/PLATELET
Abs Immature Granulocytes: 0.1 10*3/uL — ABNORMAL HIGH (ref 0.00–0.07)
Basophils Absolute: 0 10*3/uL (ref 0.0–0.1)
Basophils Relative: 0 %
Eosinophils Absolute: 0.2 10*3/uL (ref 0.0–0.5)
Eosinophils Relative: 2 %
HCT: 45.2 % (ref 36.0–46.0)
Hemoglobin: 15.9 g/dL — ABNORMAL HIGH (ref 12.0–15.0)
Immature Granulocytes: 1 %
Lymphocytes Relative: 28 %
Lymphs Abs: 2.7 10*3/uL (ref 0.7–4.0)
MCH: 34.2 pg — ABNORMAL HIGH (ref 26.0–34.0)
MCHC: 35.2 g/dL (ref 30.0–36.0)
MCV: 97.2 fL (ref 80.0–100.0)
Monocytes Absolute: 0.8 10*3/uL (ref 0.1–1.0)
Monocytes Relative: 8 %
Neutro Abs: 5.8 10*3/uL (ref 1.7–7.7)
Neutrophils Relative %: 61 %
Platelets: 263 10*3/uL (ref 150–400)
RBC: 4.65 MIL/uL (ref 3.87–5.11)
RDW: 12.2 % (ref 11.5–15.5)
WBC: 9.6 10*3/uL (ref 4.0–10.5)
nRBC: 0 % (ref 0.0–0.2)

## 2019-01-23 MED ORDER — HEPARIN SODIUM (PORCINE) 5000 UNIT/ML IJ SOLN
5000.0000 [IU] | Freq: Three times a day (TID) | INTRAMUSCULAR | Status: DC
  Administered 2019-01-23 – 2019-02-06 (×42): 5000 [IU] via SUBCUTANEOUS
  Filled 2019-01-23 (×41): qty 1

## 2019-01-23 MED ORDER — BACLOFEN 5 MG HALF TABLET
5.0000 mg | ORAL_TABLET | Freq: Three times a day (TID) | ORAL | Status: DC
  Administered 2019-01-23 – 2019-02-06 (×41): 5 mg via ORAL
  Filled 2019-01-23 (×7): qty 1
  Filled 2019-01-23: qty 0.5
  Filled 2019-01-23 (×9): qty 1
  Filled 2019-01-23: qty 0.5
  Filled 2019-01-23 (×27): qty 1

## 2019-01-23 NOTE — Progress Notes (Signed)
Inpatient Rehabilitation  Patient information reviewed and entered into eRehab system by Caelan Branden M. Capitola Ladson, M.A., CCC/SLP, PPS Coordinator.  Information including medical coding, functional ability and quality indicators will be reviewed and updated through discharge.    

## 2019-01-23 NOTE — Evaluation (Signed)
Physical Therapy Assessment and Plan  Patient Details  Name: CHENIKA NEVILS MRN: [id number] Date of Birth: [birth year]  PT Diagnosis: Abnormal posture, Abnormality of gait, Ataxia, Coordination disorder, Difficulty walking, Edema, Hypotonia, Muscle weakness and Pain in cervical spine Rehab Potential: Good ELOS: 2-2.5 weeks   Today's Date: 01/23/2019 PT Individual Time: 0805-0905 PT Individual Time Calculation (min): 60 min    Problem List:  Patient Active Problem List   Diagnosis Date Noted  . Cervical myelopathy ([place]) 01/22/2019  . Spinal cord compression due to degenerative disorder of spinal column 01/20/2019  . Synovial cyst   . Spondylolisthesis, cervical region 01/19/2019  . Chronic back pain 01/18/2019  . Multiple sclerosis exacerbation ([place]) 01/17/2019  . Erythrocytosis 09/13/2018  . Vitamin D deficiency 03/09/2016  . Multiple sclerosis ([place]) 02/24/2016  . Tobacco abuse 12/11/2015  . Aortic atherosclerosis ([place]) 12/11/2015  . 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 Falls frequently 12/11/2015  . 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 Other emphysema ([place]) 12/11/2015    Past Medical History:  Past Medical History:  Diagnosis Date  . Anemia    my whole life, Used to take iron  . Arthritis    spine, lumbar  . COPD (chronic obstructive pulmonary disease) ([place])   . Emphysema of lung ([place])   . Narcolepsy    by history, has nightmares  . Neuromuscular disorder ([place])    leg issues from spine   Past Surgical History:  Past Surgical History:  Procedure Laterality Date  . EYE SURGERY Right   . ORIF FACIAL FRACTURE    . TUBAL LIGATION    . tubaligation      Assessment & Plan Clinical Impression: Patient is a 63 y.o. right-handed female with history of COPD/tobacco abuse, back pain, multiple sclerosis diagnosed 3 years ago.  Per chart review lives with her children.  1 level home 4 steps to entry.  She was able to transfer as  well as ambulate independently with some increased time for task prior to admission.  Presented 01/17/2019 with progressive weakness.  MRI of the brain stable since 2019.  Advanced chronic demyelinating disease with no progressive or active demyelination identified.  No new intracranial abnormality.  MRI cervical spine despite the advanced chronic demyelinating disease the symptomatic abnormality appears to be degenerative in nature.  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  Associated acute exacerbation of the chronic facet arthropathy at C2-3 and 3-4.  Underlying chronic cervical cord demyelinating disease appears stable without progression.  Patient initially seen by neurology services suspect multiple sclerosis exacerbation started on Solu-Medrol.  On 01/19/2019 patient patient reporting improvement paresthesia continued to have weakness.  Neurosurgery consulted to review MRI cervical spine noting spinal cord compression/synovial cyst with noted cervical spondylolisthesis and patient underwent posterior cervical laminectomy for synovial cyst resection 01/19/2019 per Dr. Christella Noa.  Hospital course pain management.  No cervical brace required.  Her IV Solu-Medrol was discontinued 01/20/2019 as MS exacerbation was ruled out.  Patient is tolerating a regular diet.  Therapy evaluations completed and patient was admitted for a comprehensive rehab program. Patient transferred to [facility] on 01/22/2019 .   Patient currently requires mod with mobility secondary to muscle weakness, decreased cardiorespiratoy endurance, abnormal tone, ataxia and decreased coordination and decreased sitting balance, decreased standing balance, decreased postural control and decreased balance strategies.  Prior to hospitalization, patient was modified  independent  with mobility and lived with Family,  Son(son and 53 yo grandson) in a House home.  Home access is 4Stairs to enter.  Patient will benefit from skilled PT intervention to maximize safe functional mobility, minimize fall risk and decrease caregiver burden for planned discharge home with 24 hour supervision.  Anticipate patient will benefit from follow up [place] at discharge.  PT - End of Session Activity Tolerance: Tolerates 30+ min activity with multiple rests Endurance Deficit: Yes Endurance Deficit Description: Requires sitting rest breaks after minimal activity PT Assessment Rehab Potential (ACUTE/IP ONLY): Good PT Barriers to Discharge: Home environment access/layout;Other (comments) PT Barriers to Discharge Comments: Patient has 4 STE with a L rail, she is open to putting a ramp in and was looking into having one installed PTA, she has a significant history of falls reported 1-7 per month this past year PT Patient demonstrates impairments in the following area(s): Balance;Edema;Endurance;Motor;Nutrition;Pain;Perception;Safety;Sensory;Skin Integrity PT Transfers Functional Problem(s): Bed Mobility;Bed to Chair;Car;Furniture;Floor PT Locomotion Functional Problem(s): Ambulation;Wheelchair Mobility;Stairs PT Plan PT Intensity: Minimum of 1-2 x/day ,45 to 90 minutes PT Frequency: 5 out of 7 days PT Duration Estimated Length of Stay: 2-2.5 weeks PT Treatment/Interventions: Ambulation/gait training;Discharge planning;Functional mobility training;Psychosocial support;Therapeutic Activities;Balance/vestibular training;Disease management/prevention;Neuromuscular re-education;Skin care/wound management;Therapeutic Exercise;Wheelchair propulsion/positioning;DME/adaptive equipment instruction;Pain management;Splinting/orthotics;UE/LE Strength taining/ROM;Community reintegration;Functional electrical stimulation;Patient/family education;Stair training;UE/LE Coordination activities PT Transfers Anticipated Outcome(s): mod I with LRAD PT  Locomotion Anticipated Outcome(s): supervision with LRAD 50 feet PT Recommendation Recommendations for Other Services: Neuropsych consult;Therapeutic Recreation consult Therapeutic Recreation Interventions: Stress management Follow Up Recommendations: Home health PT Patient destination: Home Equipment Recommended: To be determined Equipment Details: Patient has a RW, rollator, and a hospital bed at home  Skilled Therapeutic Intervention In addition to the PT evaluation below, the patient performed the following skilled PT interventions: Session 1: Patient in bed upon PT arrival. Patient alert and agreeable to PT session. Patient reported 3/10 neck pain during session, RN made aware. PT provided repositioning, rest breaks, and distraction as pain interventions throughout session.   Therapeutic Activity: Bed Mobility: Patient performed rolling L/R and supine to/from sit with min A for trunk and LE management in a flat bed without use of bed rails. Provided verbal cues for pushing with the opposite LE to roll. Transfers: Patient performed sit to/from stand and stand pivot x1 without AD with mod A and using a RW with min A. Provided verbal cues for hand placement on RW, reaching back to sit, and scooting forward and leaning forward to stand.  Gait Training:  Patient ambulated 12 feet using RW with min A for balance. Ambulated with step-to progressing to step-through gait pattern leading with R with variable foot placement and narrow BOS and decreased B DF with reduced foot clearance. Provided verbal cues for erect posture, staying close to the RW, sequencing, and manual facilitation for weight shifts.  Wheelchair Mobility:  Patient propelled wheelchair 55 feet, limited by fatigue, with supervision. Provided verbal cues for propulsion and turning technique.  Patient in recliner in the room at end of session with breaks locked, chair alarm set, and all needs within reach.   Instructed pt in  results of PT evaluation as detailed below, PT POC, rehab potential, rehab goals, and discharge recommendations. Additionally discussed CIR's policies regarding fall safety and use of chair alarm and/or quick release belt. Pt verbalized understanding and in agreement. Will update pt's family members as they become available.   Session 2: Patient in recliner in the room upon PT arrival. Patient alert  and agreeable to PT session. Patient reported 5/10 neck pain during session, RN made aware and provided pain medicine during session. PT provided repositioning, rest breaks, and distraction as pain interventions throughout session. Patient reported increased fatigue this afternoon and requested to get back to bed, but was agreeable to bed level exercises.   Therapeutic Activity: Bed Mobility: Patient performed sit to supine with min A for LE management in a flat bed without use of bed rails. Provided verbal cues for performing log rolling to maintain cervical precautions. Transfers: Patient performed a stand pivot transfer from the recliner to the bed with min A and increased time due to fatigue. Provided verbal cues for pushing up from arm rests, weight shifting to step during pivot and reaching back to sit.  Therapeutic Exercise: Patient performed the following exercises with verbal and tactile cues for proper technique. -B Ankle pumps 2x10 -B heel slides 2x10 with 25% assist for R LE -B SLR 2x10 with 25-50% assist   Patient in bed at end of session with breaks locked, bed alarm set, and all needs within reach. Discussed patient's home set up and provided patient with a measurement sheet to provide to her daughter today during session.    PT Evaluation Precautions/Restrictions Precautions Precautions: Fall;Cervical Precaution Booklet Issued: Yes (comment) Precaution Comments: reviewed log roll technique and all cervical precautions Required Braces or Orthoses: (no brace needed per  orders) Restrictions Weight Bearing Restrictions: No   Vital Signs  BP: 167/98 (RN made aware) HR: 75 SPO2: 98% on RA Pain Pain Assessment Pain Scale: 0-10 Pain Score: 4  Pain Type: Acute pain;Surgical pain Pain Location: Back Pain Orientation: Lower Pain Descriptors / Indicators: Aching Pain Onset: On-going Pain Intervention(s): Medication (See eMAR) Home Living/Prior Functioning Home Living Available Help at Discharge: Family Type of Home: House Home Access: Stairs to enter Technical brewer of Steps: 4 Entrance Stairs-Rails: Left Home Layout: One level Bathroom Shower/Tub: Walk-in shower;Curtain(has a shower stool and hand held shower head) Bathroom Toilet: Standard Bathroom Accessibility: No  Lives With: Family;Son(son and 13 yo grandson) Prior Function Level of Independence: Requires assistive device for independence;Independent with transfers;Independent with gait;Needs assistance with homemaking Homemaking Assistance Comments: Pt completes her own laundry and washes dishes, does not do any cooking or other cleaning  Able to Take Stairs?: Yes Driving: No Vocation: Unemployed Comments: Reports 1-7 falls per month, no serious injuries and her son assists her with getting up Vision/Perception  Vision - Assessment Eye Alignment: Within Functional Limits Ocular Range of Motion: Within Functional Limits Alignment/Gaze Preference: Within Defined Limits Tracking/Visual Pursuits: Decreased smoothness of eye movement to LEFT superior field;Decreased smoothness of eye movement to LEFT inferior field;Decreased smoothness of vertical tracking;Decreased smoothness of eye movement to RIGHT superior field;Decreased smoothness of eye movement to RIGHT inferior field;Decreased smoothness of horizontal tracking Saccades: Overshoots;Additional eye shifts occurred during testing Convergence: Impaired (comment)(~8cm) Perception Perception: Within Functional Limits Praxis Praxis:  Intact  Cognition Overall Cognitive Status: Within Functional Limits for tasks assessed Arousal/Alertness: Awake/alert Attention: Selective Selective Attention: Appears intact Memory: Appears intact Awareness: Appears intact Problem Solving: Appears intact Safety/Judgment: Appears intact Sensation Sensation Light Touch: Impaired Detail Light Touch Impaired Details: Impaired RUE;Impaired LUE;Impaired RLE;Impaired LLE Proprioception: Impaired by gross assessment Additional Comments: Decreased B LE sensation from calves to toes, slight decreased in B hands patient reports that this has improved since her surgery Coordination Gross Motor Movements are Fluid and Coordinated: No Fine Motor Movements are Fluid and Coordinated: No Coordination and Movement Description: generalized weakness  distal>proximal in all extremities and decreased activity tolerance Finger Nose Finger Test: mild ataxia on Rt Heel Shin Test: slow but WNL Motor  Motor Motor: Other (comment) Motor - Skilled Clinical Observations: generalized weakness proximal>distal in all extremities and decreased activity tolerance  Mobility Bed Mobility Bed Mobility: Rolling Right;Rolling Left;Supine to Sit;Sit to Supine Rolling Right: Minimal Assistance - Patient > 75% Rolling Left: Minimal Assistance - Patient > 75% Supine to Sit: Minimal Assistance - Patient > 75% Sit to Supine: Minimal Assistance - Patient > 75% Transfers Transfers: Sit to Stand;Stand to Sit;Stand Pivot Transfers Sit to Stand: Moderate Assistance - Patient 50-74% Stand to Sit: Moderate Assistance - Patient 50-74% Stand Pivot Transfers: Moderate Assistance - Patient 50 - 74% Transfer (Assistive device): None Locomotion  Gait Ambulation: Yes Gait Assistance: Minimal Assistance - Patient > 75% Gait Distance (Feet): 12 Feet Assistive device: Rolling walker Gait Gait: Yes Gait Pattern: Step-to pattern;Step-through pattern;Decreased step length -  left;Decreased stance time - right;Decreased stride length;Decreased hip/knee flexion - right;Decreased hip/knee flexion - left;Decreased dorsiflexion - right;Decreased dorsiflexion - left;Decreased weight shift to right;Right flexed knee in stance;Left flexed knee in stance;Decreased trunk rotation;Trunk flexed;Narrow base of support Gait velocity: significantly decreased Wheelchair Mobility Wheelchair Mobility: Yes Wheelchair Assistance: Chartered loss adjuster: Both upper extremities Wheelchair Parts Management: Needs assistance Distance: 55 feet  Trunk/Postural Assessment  Cervical Assessment Cervical Assessment: Exceptions to WFL(forward head) Thoracic Assessment Thoracic Assessment: Exceptions to WFL(rounded shoulders) Lumbar Assessment Lumbar Assessment: Exceptions to WFL(posterior pelvic tilt) Postural Control Postural Control: Deficits on evaluation(decreased/delayed)  Balance Balance Balance Assessed: Yes Static Sitting Balance Static Sitting - Balance Support: Right upper extremity supported;Left upper extremity supported;Feet supported Static Sitting - Level of Assistance: 5: Stand by assistance Dynamic Sitting Balance Dynamic Sitting - Balance Support: Right upper extremity supported;Left upper extremity supported;Feet supported Dynamic Sitting - Level of Assistance: 4: Min assist Dynamic Sitting - Balance Activities: Lateral lean/weight shifting;Forward lean/weight shifting;Reaching for objects;Reaching across midline Static Standing Balance Static Standing - Balance Support: Bilateral upper extremity supported;During functional activity Static Standing - Level of Assistance: 4: Min assist Dynamic Standing Balance Dynamic Standing - Balance Support: Bilateral upper extremity supported;During functional activity Dynamic Standing - Level of Assistance: 3: Mod assist Dynamic Standing - Balance Activities: Forward lean/weight shifting;Lateral  lean/weight shifting Extremity Assessment  RUE Assessment RUE Assessment: Exceptions to [facility] Active Range of Motion (AROM) Comments: WNL General Strength Comments: strength 3+/5 overall, loose gross grasp, decreased coordination with thumb opposition LUE Assessment LUE Assessment: Within Functional Limits General Strength Comments: 4/5 RLE Assessment RLE Assessment: Exceptions to [facility] Active Range of Motion (AROM) Comments: WFL for functional mobilty, tight hamstrings and heel cords General Strength Comments: Grossly in sitting: 4-/5 throughout except 3-/5 DF LLE Assessment LLE Assessment: Exceptions to [facility] Active Range of Motion (AROM) Comments: WFL for functional mobilty, tight hamstrings and heel cords General Strength Comments: Grossly in sitting: 4/5 throughout except 3/5 DF    Refer to Care Plan for Long Term Goals  Recommendations for other services: Neuropsych and Therapeutic Recreation  Stress management  Discharge Criteria: Patient will be discharged from PT if patient refuses treatment 3 consecutive times without medical reason, if treatment goals not met, if there is a change in medical status, if patient makes no progress towards goals or if patient is discharged from hospital.  The above assessment, treatment plan, treatment alternatives and goals were discussed and mutually agreed upon: by patient  Doreene Burke PT, DPT  01/23/2019, 12:44 PM

## 2019-01-23 NOTE — Patient Care Conference (Signed)
Inpatient RehabilitationTeam Conference and Plan of Care Update Date: 01/23/2019   Time: 11:30 AM    Patient Name: Faith [name]      Medical Record Number: [id number]  Date of Birth: 02-19-1956 Sex: Female         Room/Bed: 4M05C/4M05C-01 Payor Info: Payor: MEDICAID [place] / Plan: MEDICAID [place] ACCESS / Product Type: *No Product type* /    Admit Date/Time:  01/22/2019  2:42 PM  Primary Diagnosis:  Cervical myelopathy ([place])  Patient Active Problem List   Diagnosis Date Noted  . Cervical myelopathy ([place]) 01/22/2019  . Spinal cord compression due to degenerative disorder of spinal column 01/20/2019  . Synovial cyst   . Spondylolisthesis, cervical region 01/19/2019  . Chronic back pain 01/18/2019  . Multiple sclerosis exacerbation ([place]) 01/17/2019  . Erythrocytosis 09/13/2018  . Vitamin D deficiency 03/09/2016  . Multiple sclerosis ([place]) 02/24/2016  . Tobacco abuse 12/11/2015  . Aortic atherosclerosis ([place]) 12/11/2015  . 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 Falls frequently 12/11/2015  . 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 Other emphysema ([place]) 12/11/2015    Expected Discharge Date: Expected Discharge Date: (Estimated LOS 14 to 18 days)  Team Members Present: Physician leading conference: Dr. Courtney Heys Social Worker Present: Lennart Pall, LCSW Nurse Present: Other (comment)(Victoria Lovie Macadamia) Case Manager: Karene Fry, RN PT Present: Apolinar Junes, PT OT Present: Roanna Epley, [place], OT SLP Present: Jettie Booze, CF-SLP PPS Coordinator present : Gunnar Fusi, SLP     Current Status/Progress Goal Weekly Team Focus  Bowel/Bladder   pt continent of B/B, LBM 11/21, was given prn during dayshift  remain continet  assess toileting q shift and prn   Swallow/Nutrition/ Hydration             ADL's   min assist transfers with RW/Mod assist without AD, Max assist LB bathing and dressing, Min assist UB bathing and  dressing  Supervision  ADL retraining, AE education, dynamic standing balance, safety awareness/education   Mobility   mod/min assist overall, gait 12 ft w/ rw.  mod I bed mob/transfers, supervision gait wLRAD  balance, NMR, func mobility activity tolerance, strengthening, family ed.   Communication             Safety/Cognition/ Behavioral Observations            Pain   pt has c/o pain, has scheduled and prns  pain less than 5  assess pain q shift and prn   Skin   anterior & posterior neck incision w/ skin glue, abrasion on LLE, ecchymosis on BUE  prevent further skin breakdown  assess skin q shift and prn    Rehab Goals Patient on target to meet rehab goals: Yes *See Care Plan and progress notes for long and short-term goals.     Barriers to Discharge  Current Status/Progress Possible Resolutions Date Resolved   Nursing                  PT  Home environment access/layout;Other (comments)  Patient has 4 STE with a L rail, she is open to putting a ramp in and was looking into having one installed PTA, she has a significant history of falls reported 1-7 per month this past year              OT Home environment access/layout  bathroom not accessible via RW             SLP  SW                Discharge Planning/Teaching Needs:  Home with son who can provide 24/7 assistance.  Teaching needs TBD   Team Discussion: h/o COPD, MS X 3 yrs, weakness, MRI revealed synovial cyst, had ant/post cervical fusion, ? Needs bowel program, ordered dopplers.  RN - had BM 11/21, taking tramadol and oxy for pain, has ant/post surgical incisions, weak after therapy.  OT S goals, min/mod stand pivot, unsteady standing, Max A LB D.  PT min A med and standing, amb 12' with walker, unsteady on feet, min A goals for 4 steps.  Son can assist at home.  Looking at ? Ramp to be built, has mod I goals, S gait and min A stairs goal.   Revisions to Treatment Plan: N/A     Medical Summary Current  Status: LBM 11/21- has prn tramadol and oxycodone; anterior/post surgical incision; is voiding. Weekly Focus/Goal: superviosn goals for OT- min-mod assist unsteady/fearful of standing; MAx assist LB dressing; walked 12 ft RW  Barriers to Discharge: Decreased family/caregiver support;Neurogenic Bowel & Bladder;Medical stability;Medication compliance;Home enviroment access/layout;Weight;Wound care  Barriers to Discharge Comments: ataxic- better Possible Resolutions to Barriers: quadriparesis   Continued Need for Acute Rehabilitation Level of Care: The patient requires daily medical management by a physician with specialized training in physical medicine and rehabilitation for the following reasons: Direction of a multidisciplinary physical rehabilitation program to maximize functional independence : Yes Medical management of patient stability for increased activity during participation in an intensive rehabilitation regime.: Yes Analysis of laboratory values and/or radiology reports with any subsequent need for medication adjustment and/or medical intervention. : Yes   I attest that I was present, lead the team conference, and concur with the assessment and plan of the team.   Retta Diones 01/24/2019, 9:39 PM  Team conference was held via web/ teleconference due to [place] - 19

## 2019-01-23 NOTE — Evaluation (Signed)
Occupational Therapy Assessment and Plan  Patient Details  Name: Faith [name] MRN: [id number] Date of Birth: Apr 22, 1955  OT Diagnosis: abnormal posture, acute pain and muscle weakness (generalized) Rehab Potential: Rehab Potential (ACUTE ONLY): Good ELOS: 14-18   Today's Date: 01/23/2019 OT Individual Time: 2119-4174 OT Individual Time Calculation (min): 72 min     Problem List:  Patient Active Problem List   Diagnosis Date Noted  . Cervical myelopathy ([place]) 01/22/2019  . Spinal cord compression due to degenerative disorder of spinal column 01/20/2019  . Synovial cyst   . Spondylolisthesis, cervical region 01/19/2019  . Chronic back pain 01/18/2019  . Multiple sclerosis exacerbation ([place]) 01/17/2019  . Erythrocytosis 09/13/2018  . Vitamin D deficiency 03/09/2016  . Multiple sclerosis ([place]) 02/24/2016  . Tobacco abuse 12/11/2015  . Aortic atherosclerosis ([place]) 12/11/2015  . 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 Falls frequently 12/11/2015  . 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 Other emphysema ([place]) 12/11/2015    Past Medical History:  Past Medical History:  Diagnosis Date  . Anemia    my whole life, Used to take iron  . Arthritis    spine, lumbar  . COPD (chronic obstructive pulmonary disease) ([place])   . Emphysema of lung ([place])   . Narcolepsy    by history, has nightmares  . Neuromuscular disorder ([place])    leg issues from spine   Past Surgical History:  Past Surgical History:  Procedure Laterality Date  . EYE SURGERY Right   . ORIF FACIAL FRACTURE    . TUBAL LIGATION    . tubaligation      Assessment & Plan Clinical Impression: Patient is a 63 y.o. right-handed female with history of COPD/tobacco abuse, back pain, multiple sclerosis diagnosed 3 years ago.  Per chart review lives with her children.  1 level home 4 steps to entry.  She was able to transfer as well as ambulate independently with some increased time for  task prior to admission.  Presented 01/17/2019 with progressive weakness.  MRI of the brain stable since 2019.  Advanced chronic demyelinating disease with no progressive or active demyelination identified.  No new intracranial abnormality.  MRI cervical spine despite the advanced chronic demyelinating disease the symptomatic abnormality appears to be degenerative in nature.  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  Associated acute exacerbation of the chronic facet arthropathy at C2-3 and 3-4.  Underlying chronic cervical cord demyelinating disease appears stable without progression.  Patient initially seen by neurology services suspect multiple sclerosis exacerbation started on Solu-Medrol.  On 01/19/2019 patient patient reporting improvement paresthesia continued to have weakness.  Neurosurgery consulted to review MRI cervical spine noting spinal cord compression/synovial cyst with noted cervical spondylolisthesis and patient underwent posterior cervical laminectomy for synovial cyst resection 01/19/2019 per Dr. Christella Noa.  Hospital course pain management.  No cervical brace required.  Her IV Solu-Medrol was discontinued 01/20/2019 as MS exacerbation was ruled out.  Patient is tolerating a regular diet.  Therapy evaluations completed and patient was admitted for a comprehensive rehab program.  Patient transferred to CIR on 01/22/2019 .    Patient currently requires max with basic self-care skills secondary to muscle weakness, decreased cardiorespiratoy endurance, unbalanced muscle activation, ataxia and decreased coordination and decreased standing balance, decreased postural control and decreased balance strategies.  Prior to hospitalization, patient could complete ADLs with modified independent .  Patient will benefit from skilled intervention to decrease level of assist  with basic self-care skills prior to discharge home with care partner.  Anticipate patient will require 24 hour supervision and follow up home health.  OT - End of Session Activity Tolerance: Tolerates 30+ min activity with multiple rests Endurance Deficit: Yes Endurance Deficit Description: requires frequent rest breaks OT Assessment Rehab Potential (ACUTE ONLY): Good OT Barriers to Discharge: Home environment access/layout OT Barriers to Discharge Comments: bathroom not accessible via RW OT Patient demonstrates impairments in the following area(s): Balance;Endurance;Motor;Pain;Safety OT Basic ADL's Functional Problem(s): Grooming;Bathing;Dressing;Toileting OT Advanced ADL's Functional Problem(s): Laundry OT Transfers Functional Problem(s): Toilet;Tub/Shower OT Additional Impairment(s): None OT Plan OT Intensity: Minimum of 1-2 x/day, 45 to 90 minutes OT Frequency: 5 out of 7 days OT Duration/Estimated Length of Stay: 14-18   Skilled Therapeutic Intervention OT eval completed with discussion of rehab process, OT purpose, POC, ELOS, and goals.  ADL assessment completed with bathing and dressing at sit > stand level at sink. Pt completed sit > stand and stand pivot transfers with RW throughout session with RW.  One attempt for stand pivot without RW w/c <>BSC with mod assist due to increased ataxia and fearfulness with movement.  Max assist LB bathing and dressing, pt reports using long handled brush PTA and occasionally needing assistance donning socks and shoes.  Pt utilized nondominant LUE for majority of self-care tasks as she reports dominant RUE has gotten weaker over the last several weeks.  Encouraged pt to utilize dominant UE as able.  OT Evaluation Precautions/Restrictions  Precautions Precautions: Fall;Cervical Precaution Booklet Issued: Yes (comment) Precaution Comments: reviewed log roll technique and all cervical precautions Required Braces or Orthoses: (no brace needed per  orders) Restrictions Weight Bearing Restrictions: No Pain Pain Assessment Pain Scale: 0-10 Pain Score: 4  Pain Type: Acute pain;Surgical pain Pain Location: Back Pain Orientation: Lower Pain Descriptors / Indicators: Aching Pain Onset: On-going Pain Intervention(s): Medication (See eMAR) Home Living/Prior Functioning Home Living Family/patient expects to be discharged to:: Private residence Living Arrangements: Children Available Help at Discharge: Family Type of Home: House Home Access: Stairs to enter Technical brewer of Steps: 4 Entrance Stairs-Rails: Left Home Layout: One level Bathroom Shower/Tub: Walk-in shower, Curtain(has a shower stool and hand held shower head) Bathroom Toilet: Associate Professor Accessibility: No  Lives With: Family, Son(son and 71 yo grandson) IADL History Homemaking Responsibilities: Yes Meal Prep Responsibility: No Laundry Responsibility: Primary Cleaning Responsibility: Secondary(washes dishes) Bill Paying/Finance Responsibility: Secondary Shopping Responsibility: Primary(uses motorized cart in store) Current License: No Prior Function Level of Independence: Requires assistive device for independence, Independent with transfers, Independent with gait, Needs assistance with homemaking Homemaking Assistance Comments: Pt completes her own laundry and washes dishes, does not do any cooking or other cleaning  Able to Take Stairs?: Yes Driving: No Vocation: Unemployed Comments: Reports 1-7 falls per month, no serious injuries and her son assists her with getting up ADL ADL Grooming: Minimal assistance Where Assessed-Grooming: Sitting at sink Upper Body Bathing: Minimal assistance Where Assessed-Upper Body Bathing: Sitting at sink Lower Body Bathing: Moderate assistance Where Assessed-Lower Body Bathing: Sitting at sink, Standing at sink Upper Body Dressing: Minimal assistance Where Assessed-Upper Body Dressing: Sitting at sink Lower  Body Dressing: Maximal assistance Where Assessed-Lower Body Dressing: Sitting at sink, Standing at sink Toileting: Maximal assistance Where Assessed-Toileting: Bedside Commode Toilet Transfer: Moderate assistance Toilet Transfer Method: Stand pivot Toilet Transfer Equipment: Bedside commode Vision Baseline Vision/History: Wears glasses Wears Glasses: Reading only Patient Visual Report:  No change from baseline(Simultaneous filing. User may not have seen previous data.) Vision Assessment?: Yes(Simultaneous filing. User may not have seen previous data.) Eye Alignment: Within Functional Limits Ocular Range of Motion: Within Functional Limits Alignment/Gaze Preference: Within Defined Limits Tracking/Visual Pursuits: Decreased smoothness of eye movement to LEFT superior field;Decreased smoothness of eye movement to LEFT inferior field;Decreased smoothness of vertical tracking;Decreased smoothness of eye movement to RIGHT superior field;Decreased smoothness of eye movement to RIGHT inferior field;Decreased smoothness of horizontal tracking Saccades: Overshoots;Additional eye shifts occurred during testing Convergence: Impaired (comment)(~8cm) Perception  Perception: Within Functional Limits Praxis Praxis: Intact Cognition Overall Cognitive Status: Within Functional Limits for tasks assessed Arousal/Alertness: Awake/alert Orientation Level: Person;Place;Situation Person: Oriented Place: Oriented Situation: Oriented Year: 2020 Month: November Day of Week: Correct Memory: Appears intact Immediate Memory Recall: Sock;Blue;Bed Memory Recall Sock: Without Cue Memory Recall Blue: Without Cue Memory Recall Bed: With Cue Attention: Selective Selective Attention: Appears intact Awareness: Appears intact Problem Solving: Appears intact Safety/Judgment: Appears intact Sensation Sensation Light Touch: Impaired Detail Light Touch Impaired Details: Impaired RUE;Impaired LUE;Impaired  RLE;Impaired LLE Proprioception: Impaired by gross assessment Additional Comments: Decreased Coordination Gross Motor Movements are Fluid and Coordinated: No Fine Motor Movements are Fluid and Coordinated: No Finger Nose Finger Test: mild ataxia on Rt Extremity/Trunk Assessment RUE Assessment RUE Assessment: Exceptions to [facility] Active Range of Motion (AROM) Comments: WNL General Strength Comments: strength 3+/5 overall, loose gross grasp, decreased coordination with thumb opposition LUE Assessment LUE Assessment: Within Functional Limits General Strength Comments: 4/5     Refer to Care Plan for Long Term Goals  Recommendations for other services: None    Discharge Criteria: Patient will be discharged from OT if patient refuses treatment 3 consecutive times without medical reason, if treatment goals not met, if there is a change in medical status, if patient makes no progress towards goals or if patient is discharged from hospital.  The above assessment, treatment plan, treatment alternatives and goals were discussed and mutually agreed upon: by patient  Simonne Come 01/23/2019, 12:26 PM

## 2019-01-23 NOTE — Progress Notes (Signed)
Lower extremity venous has been completed.   Preliminary results in CV Proc.   Faith [name] 01/23/2019 3:48 PM

## 2019-01-23 NOTE — Progress Notes (Signed)
Occupational Therapy Session Note  Patient Details  Name: Faith [name] MRN: [id number] Date of Birth: [birth year]  Today's Date: 01/23/2019 OT Individual Time: 1345-1425 OT Individual Time Calculation (min): 40 min    Short Term Goals: Week 1:  OT Short Term Goal 1 (Week 1): Pt will complete LB dressing with min assist with AE as needed OT Short Term Goal 2 (Week 1): Pt will complete toilet transfers min assist with RW 3/5 attempts OT Short Term Goal 3 (Week 1): Pt will complete 1 grooming task in standing to demonstrate increased standing tolerance  Skilled Therapeutic Interventions/Progress Updates:    Pt resting in recliner upon arrival.  OT intervention with focus on discharge planning, review of OT LTGs, role of OT, sit<>stand, standing balance, activity tolerance, and safety awareness to increase independence with BADLs. Pt states she is "pretty worn out" for earlier therapies.  Pt states her RW will not fit into either bathroom and pt has to walk without AD using walls and counter tops to assist.  Pt has walk in shower with shower curtain.  Pt has a seat. Sit<>stand X 4 with min A and max A for standing balance. Pt states her legs feel "really weak." Pt remained seated in recliner with all needs within reach and seat alarm activated.   Therapy Documentation Precautions:  Precautions Precautions: Fall, Cervical Precaution Booklet Issued: Yes (comment) Precaution Comments: reviewed log roll technique and all cervical precautions Required Braces or Orthoses: (no brace needed per orders) Restrictions Weight Bearing Restrictions: No Pain: Pain Assessment Pain Scale: 0-10 Pain Score: 4  Pain Type: Acute pain;Surgical pain Pain Location: Back Pain Orientation: Lower Pain Intervention(s): repositioned  Therapy/Group: Individual Therapy  Faith [name] 01/23/2019, 2:43 PM

## 2019-01-23 NOTE — Progress Notes (Signed)
[facility] PHYSICAL MEDICINE & REHABILITATION PROGRESS NOTE   Subjective/Complaints:  Pt reports hands strength is a little better- but not really feet- on bedpan so cannot show me as much strength as she wants.  Most of pain is in neck from surgery- pain meds help and work when takes them.   Able to void- having BMs  ROS- denies SOB, CP, N/V/D.    Objective:   No results found. Recent Labs    01/22/19 0611 01/23/19 0441  WBC 9.2 9.6  HGB 15.5* 15.9*  HCT 45.4 45.2  PLT 235 263   Recent Labs    01/22/19 0611 01/23/19 0441  NA 139 136  K 4.1 3.8  CL 105 104  CO2 25 24  GLUCOSE 87 104*  BUN 7* 11  CREATININE 0.49 0.47  CALCIUM 8.9 8.9    Intake/Output Summary (Last 24 hours) at 01/23/2019 [id number] Last data filed at 01/22/2019 1848 Gross per 24 hour  Intake 240 ml  Output 700 ml  Net -460 ml     Physical Exam: Vital Signs Blood pressure (!) 146/92, pulse 61, temperature 97.8 F (36.6 C), resp. rate 17, height 5\' 6"  (1.676 m), weight 43.5 kg, SpO2 98 %. Vitals and labs reviewed- Gen- awake, alert, appropriate, sitting up on bedpan, lying in bed supine, NAD HEENT- conjugate gaze; no facial assymmetry CV- RRR Pulm- CTA B/L GI- soft, NT, ND;  (+)BS MS- tested UEs today- didn't want to disturb bedpan so didn't check LEs- LUE- deltoid/bicep/tricep 4/5; WE/grip/finger abd 4-/5 RUE- Delt/bicep/tricep 4-/5 and WE/grip and finger abd 3+/5 Skin- no skin breakdown seen on exam currently- has anterior incision glued on anterior cervical neck- looks good- no drainage/no erythema    Assessment/Plan: 1. Functional deficits secondary to cervical myelopathy which require 3+ hours per day of interdisciplinary therapy in a comprehensive inpatient rehab setting.  Physiatrist is providing close team supervision and 24 hour management of active medical problems listed below.  Physiatrist and rehab team continue to assess barriers to discharge/monitor patient progress toward  functional and medical goals  Care Tool:  Bathing              Bathing assist       Upper Body Dressing/Undressing Upper body dressing   What is the patient wearing?: Hospital gown only    Upper body assist Assist Level: Maximal Assistance - Patient 25 - 49%    Lower Body Dressing/Undressing Lower body dressing      What is the patient wearing?: Incontinence brief     Lower body assist Assist for lower body dressing: Maximal Assistance - Patient 25 - 49%     Toileting Toileting    Toileting assist Assist for toileting: Maximal Assistance - Patient 25 - 49%     Transfers Chair/bed transfer  Transfers assist           Locomotion Ambulation   Ambulation assist              Walk 10 feet activity   Assist           Walk 50 feet activity   Assist           Walk 150 feet activity   Assist           Walk 10 feet on uneven surface  activity   Assist           Wheelchair     Assist  Wheelchair 50 feet with 2 turns activity    Assist            Wheelchair 150 feet activity     Assist          Blood pressure (!) 146/92, pulse 61, temperature 97.8 F (36.6 C), resp. rate 17, height 5\' 6"  (1.676 m), weight 43.5 kg, SpO2 98 %.  Medical Problem List and Plan: 1.  Decrease in strength, mobility and coordination secondary to cervical synovial cyst, spinal cord compression, cervical spondylolisthesis with radiculopathy C3-4.S/P posterior cervical laminectomy for synovial cyst resection, posterior cervical arthrodesis with anterior instrumentation 01/19/2019.  No cervical brace required             -Will discuss with surgery team to determine when patient may shower.             -ELOS/Goals: Mod I PT and OT, I SLP 2.  Antithrombotics: -DVT/anticoagulation: SCDs.  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             -antiplatelet therapy: N/A 3. Pain Management: Celebrex 200 mg every 12 hours, Neurontin 600 mg 3 times daily, oxycodone/tramadol and Valium as needed 4. Mood: Provide emotional support             -antipsychotic agents: N/A 5. Neuropsych: This patient is capable of making decisions on her own behalf. 6. Skin/Wound Care: Routine skin checks 7. Fluids/Electrolytes/Nutrition: Routine in and outs with follow-up chemistries 8.  Multiple sclerosis.  Diagnosed 3 years ago.  Neurology services.  Continue Inderal 60 mg twice daily. Recommend starting Baclofen 5mg  TID for spasticity.   11/24- will start Baclofen 5 mg TID for tone/spasms.  9.  Tobacco abuse.  NicoDerm patch 10.  Neurogenic bowel bladder.  Modified bowel program.  Check PVR  11/24- will change caths/bladder scans to q6 hours and cath if volumes >200cc; will d/w pt if needs bowel program due to MS/new incomplete cervical myelopathy.    LOS: 1 days A FACE TO FACE EVALUATION WAS PERFORMED  Faith [name] 01/23/2019, 9:38 AM

## 2019-01-24 ENCOUNTER — Encounter (HOSPITAL_COMMUNITY): Payer: Self-pay | Admitting: Neurosurgery

## 2019-01-24 ENCOUNTER — Inpatient Hospital Stay (HOSPITAL_COMMUNITY): Payer: Medicaid Other

## 2019-01-24 NOTE — Progress Notes (Signed)
Social Work Assessment and Plan   Patient Details  Name: Faith [name] MRN: [id number] Date of Birth: 1955/06/05  Today's Date: 01/24/2019  Problem List:  Patient Active Problem List   Diagnosis Date Noted  . Cervical myelopathy ([place]) 01/22/2019  . Spinal cord compression due to degenerative disorder of spinal column 01/20/2019  . Synovial cyst   . Spondylolisthesis, cervical region 01/19/2019  . Chronic back pain 01/18/2019  . Multiple sclerosis exacerbation ([place]) 01/17/2019  . Erythrocytosis 09/13/2018  . Vitamin D deficiency 03/09/2016  . Multiple sclerosis ([place]) 02/24/2016  . Tobacco abuse 12/11/2015  . Aortic atherosclerosis ([place]) 12/11/2015  . 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 Falls frequently 12/11/2015  . 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 Other emphysema ([place]) 12/11/2015   Past Medical History:  Past Medical History:  Diagnosis Date  . Anemia    my whole life, Used to take iron  . Arthritis    spine, lumbar  . COPD (chronic obstructive pulmonary disease) ([place])   . Emphysema of lung ([place])   . Narcolepsy    by history, has nightmares  . Neuromuscular disorder ([place])    leg issues from spine   Past Surgical History:  Past Surgical History:  Procedure Laterality Date  . ANTERIOR CERVICAL DECOMP/DISCECTOMY FUSION N/A 01/19/2019   Procedure: ANTERIOR CERVICAL DECOMPRESSION/DISCECTOMY Cervical three - four;  Surgeon: Ashok Pall, MD;  Location: [facility];  Service: Neurosurgery;  Laterality: N/A;  . EYE SURGERY Right   . ORIF FACIAL FRACTURE    . POSTERIOR CERVICAL LAMINECTOMY N/A 01/19/2019   Procedure: POSTERIOR CERVICAL LAMINECTOMY Removal of Synovial Cyst and posterior cervical fusion;  Surgeon: Ashok Pall, MD;  Location: [facility];  Service: Neurosurgery;  Laterality: N/A;  . TUBAL LIGATION    . tubaligation     Social History:  reports that she has been smoking. She has a 11.25 pack-year smoking history. She  has never used smokeless tobacco. She reports current alcohol use of about 2.0 standard drinks of alcohol per week. She reports that she does not use drugs.  Family / Support Systems Marital Status: Separated How Long?: 5 yrs Patient Roles: Parent, Other (Comment)(gransparent) Children: son, Pincus Sanes (pt lives with him) @ (C) [phone];  daughter, Rosalyn Charters) @ [phone] Anticipated Caregiver: son  Ability/Limitations of Caregiver: Min A Caregiver Availability: 24/7 Family Dynamics: Pt notes that she has a good relationship with her children and that she is pleased with her decision to move to [place] several years ago.  Social History Preferred language: English Religion:  Cultural Background: NA Read: Yes Write: Yes Employment Status: Disabled Public relations account executive Issues: NOne Guardian/Conservator: None - per MD, pt is capable of making decisions on her own behalf   Abuse/Neglect Abuse/Neglect Assessment Can Be Completed: Yes Physical Abuse: Denies Verbal Abuse: Denies Sexual Abuse: Denies Exploitation of patient/patient's resources: Denies Self-Neglect: Denies  Emotional Status Pt's affect, behavior and adjustment status: Pt lying in bed; very pleasant and completes assessment interview without any difficulty.  She has rather flat affect most of the time, however, does smile on occasion.  She admits concerns about home entrance and of her overall functional decline.  She denies any significant emotional distress.  Will monitor and plan to refer to neuropsychology if indicated. Recent Psychosocial Issues: Pt separated from spouse in [place] and moved to live with son in 2015. Psychiatric History: NOne Substance Abuse History: NOne  Patient / Family Perceptions, Expectations & Goals Pt/Family  understanding of illness & functional limitations: Pt and family with good, general understanding of her chronic MS debility and of recent surgery to remove  spinal cyst. Premorbid pt/family roles/activities: Pt reports that, until ~ 2 weeks PTA, she was independent overall with her rollator in the home and with her ADLs. Anticipated changes in roles/activities/participation: Per goals of supervision, minimal change to roles. Pt/family expectations/goals: "I really need to be able to do the stairs to get in."  [employer]: None Premorbid Home Care/DME Agencies: Other (Comment)([facility] OPPT several years ago.) Transportation available at discharge: yes Resource referrals recommended: Neuropsychology  Discharge Planning Living Arrangements: Children Support Systems: Children, Friends/neighbors Type of Residence: Private residence Insurance Resources: [employer] (specify county) Museum/gallery curator Resources: [employer] Financial Screen Referred: No Living Expenses: Lives with family Money Management: Family Does the patient have any problems obtaining your medications?: No Home Management: pt and son share responsibilities Patient/Family Preliminary Plans: Pt to return home with son as primary support.  Daughter also available to assist. Social Work Anticipated Follow Up Needs: HH/OP Expected length of stay: 10-14 days  Clinical Impression Pleasant woman here on CIR following back surgery and with chronic MS diagnosed ~ 3 yrs ago.  Living with her adult son and her grandson with an adult daughter living close by.  SHe notes good support and is hopeful to recover close to her premorbid level of function (independent with rollator in the home.)  Denies any significant emotional distress, however, may benefit from neuropsychology consult while here.  Bunny Kleist 01/24/2019, 3:46 PM

## 2019-01-24 NOTE — Progress Notes (Signed)
Physical Therapy Session Note  Patient Details  Name: Faith [name] MRN: [id number] Date of Birth: [birth year]  Today's Date: 01/24/2019 PT Individual Time: [id number] and 1415-1530 PT Individual Time Calculation (min): 60 min and 75 min   Short Term Goals: Week 1:  PT Short Term Goal 1 (Week 1): Patient will perform bed mobility with supervision. PT Short Term Goal 2 (Week 1): Patient will perform basic transfers with CGA using LRAD. PT Short Term Goal 3 (Week 1): Patient will perform dynamic standing balance activities with CGA. PT Short Term Goal 4 (Week 1): Patient will ambulate >30 feet using LRAD with CGA.  Skilled Therapeutic Interventions/Progress Updates:     Session 1: Patient in bed upon PT arrival. Patient alert and agreeable to PT session. Patient reported 4/10 neck pain during session, declined pain medicine during session. PT provided repositioning, rest breaks, and distraction as pain interventions throughout session. B thigh high TED hose and non-skid socks donned prior to and throughout session.   Therapeutic Activity: Bed Mobility: Patient performed supine to/from sit with supervision in a flat bed without use of bed rails. Provided verbal cues for pushing through elbow to sit up rather than reaching for the bed rails. Transfers: Patient performed sit to/from stand x4 and stand pivot x3 with CGA using the RW with increased time due to weakness/fatigue. Provided verbal cues for hand placement on RW, reaching back to sit, and leaning forward to stand.  Gait Training:  Patient ambulated 28 feet using RW with CGA-min A due to fatigue. Ambulated with narrow BOS, decreased gait speed, decreased step length, decreased B hip flexion and DF, and decreased weight shift to the R. Provided verbal cues for R weight shift, increased BOS, and increased step height. RPE 8/10 after gait training. Patient went up/down 3-6" steps with min-mod A using a L rail leading with L while ascending and  R while descending. Provided cues for technique and sequencing.   Wheelchair Mobility:  Patient was transported in the w/c with total A throughout session for energy conservation and time management.  Neuromuscular Re-ed: Patient performed standing terminal knee extension on R and L with a orange band behind her knee for increased knee control with standing activities and gait 2x10. Provided cues for increased muscle control with activity.  Performed standing balance x1 with cues for preventing genu recurvatum in standing in B LEs x60-90 seconds.   Patient in bed at end of session with breaks locked, bed alarm set, and all needs within reach.   Session 2: Patient in bed upon PT arrival. Patient alert and agreeable to PT session. Patient reported 2-3/10 neck pain during session, RN made aware. PT provided repositioning, rest breaks, and distraction as pain interventions throughout session. Patient reported increased fatigue this afternoon and she requested to go to the bathroom to attempt to have a BM at beginning of session. B thigh high TED hose and non-skid socks donned prior to and throughout session.   Therapeutic Activity: Bed Mobility: Patient performed supine to/from sit with min a-close supervision. Provided verbal cues for log rolling to reduce strain on her neck with mobility. Transfers: Patient performed sit to/from stand x2 and toilet transfers with [facility] over the toilet x1 and bed to [facility] x1 with min A-CGA. Provided verbal cues for hand placement and leaning forward to stand. Patient was continent of bowl and bladder x2 during session, see flowsheet for details, and required total A for peri-care and LB dressing during toileting. Provided a pillow  behind her back with PT standing behind for increased back support on BSC. Provided education on nutritional options to improve frequency of BMs and performing trunk rocking to promote motility and to avoid the Valsalva meneuver to reduce risk of  increased intraabdominal pressure and increased BP. Patient became light headed on [facility] after ambulation to the bathroom, see details below, BP 125/82 HR 73.  Gait Training:  Patient ambulated 15 feet into the bathroom using RW with min-mod A due to decreased balance and increased sway with ambulation due to feeling light headed. Ambulated with narrow BOS, increased B genu recuvatum in stance, and mild posterior lean. Provided verbal cues and manual facilitation for forward weight shift and increased BOS.  Therapeutic Exercise: Patient performed the following exercises with verbal and tactile cues for proper technique. -B ankle pumps 2x5 -B heel slides 2x5 -B hip abduction in supine 2x5 -B SLR with 50% assist 2x5  Patient in bed at end of session with breaks locked, bed alarm set, and all needs within reach.    Therapy Documentation Precautions:  Precautions Precautions: Fall, Cervical Precaution Booklet Issued: Yes (comment) Precaution Comments: reviewed log roll technique and all cervical precautions Required Braces or Orthoses: (no brace needed per orders) Restrictions Weight Bearing Restrictions: No    Therapy/Group: Individual Therapy  Benicia Bergevin L Nyella Eckels PT, DPT  01/24/2019, 12:55 PM

## 2019-01-24 NOTE — Progress Notes (Signed)
[place] PHYSICAL MEDICINE & REHABILITATION PROGRESS NOTE   Subjective/Complaints:  Pt reports no issues- getting breakfast with PT currently.   ROS- denies SOB, CP, N/V/D.    Objective:   Vas [place] Lower Extremity Venous (dvt)  Result Date: 01/23/2019  Lower Venous Study Indications: Swelling.  Comparison Study: no prior Performing Technologist: Abram Sander RVS  Examination Guidelines: A complete evaluation includes B-mode imaging, spectral Doppler, color Doppler, and power Doppler as needed of all accessible portions of each vessel. Bilateral testing is considered an integral part of a complete examination. Limited examinations for reoccurring indications may be performed as noted.  +---------+---------------+---------+-----------+----------+--------------+ RIGHT    CompressibilityPhasicitySpontaneityPropertiesThrombus Aging +---------+---------------+---------+-----------+----------+--------------+ CFV      Full           Yes      Yes                                 +---------+---------------+---------+-----------+----------+--------------+ SFJ      Full                                                        +---------+---------------+---------+-----------+----------+--------------+ FV Prox  Full                                                        +---------+---------------+---------+-----------+----------+--------------+ FV Mid   Full                                                        +---------+---------------+---------+-----------+----------+--------------+ FV DistalFull                                                        +---------+---------------+---------+-----------+----------+--------------+ PFV      Full                                                        +---------+---------------+---------+-----------+----------+--------------+ POP      Full           Yes      Yes                                  +---------+---------------+---------+-----------+----------+--------------+ PTV      Full                                                        +---------+---------------+---------+-----------+----------+--------------+ PERO  Not visualized +---------+---------------+---------+-----------+----------+--------------+   +---------+---------------+---------+-----------+----------+--------------+ LEFT     CompressibilityPhasicitySpontaneityPropertiesThrombus Aging +---------+---------------+---------+-----------+----------+--------------+ CFV      Full           Yes      Yes                                 +---------+---------------+---------+-----------+----------+--------------+ SFJ      Full                                                        +---------+---------------+---------+-----------+----------+--------------+ FV Prox  Full                                                        +---------+---------------+---------+-----------+----------+--------------+ FV Mid   Full                                                        +---------+---------------+---------+-----------+----------+--------------+ FV DistalFull                                                        +---------+---------------+---------+-----------+----------+--------------+ PFV      Full                                                        +---------+---------------+---------+-----------+----------+--------------+ POP      Full           Yes      Yes                                 +---------+---------------+---------+-----------+----------+--------------+ PTV      Full                                                        +---------+---------------+---------+-----------+----------+--------------+ PERO                                                  Not visualized  +---------+---------------+---------+-----------+----------+--------------+     Summary: Right: There is no evidence of deep vein thrombosis in the lower extremity. No cystic structure found in the popliteal fossa. Left: There is no evidence of deep vein thrombosis in the lower extremity. A cystic structure is found in the popliteal fossa.  *See  table(s) above for measurements and observations. Electronically signed by Deitra Mayo MD on 01/23/2019 at 4:29:38 PM.    Final    Recent Labs    01/22/19 0611 01/23/19 0441  WBC 9.2 9.6  HGB 15.5* 15.9*  HCT 45.4 45.2  PLT 235 263   Recent Labs    01/22/19 0611 01/23/19 0441  NA 139 136  K 4.1 3.8  CL 105 104  CO2 25 24  GLUCOSE 87 104*  BUN 7* 11  CREATININE 0.49 0.47  CALCIUM 8.9 8.9    Intake/Output Summary (Last 24 hours) at 01/24/2019 0805 Last data filed at 01/24/2019 0600 Gross per 24 hour  Intake 360 ml  Output -  Net 360 ml     Physical Exam: Vital Signs Blood pressure 122/79, pulse 65, temperature 97.9 F (36.6 C), resp. rate 16, height 5\' 6"  (1.676 m), weight 43.5 kg, SpO2 98 %. Vitals and labs reviewed- Gen- awake, alert, appropriate, sitting up in chair bedside; eating breakfast; NAD HEENT- conjugate gaze; no facial assymmetry CV- RRR Pulm- CTA B/L GI- soft, NT, ND;  (+)BS MS- tested UEs today- didn't want to disturb bedpan so didn't check LEs- LUE- deltoid/bicep/tricep 4/5; WE/grip/finger abd 4-/5 RUE- Delt/bicep/tricep 4-/5 and WE/grip and finger abd 3+/5 Skin- no skin breakdown seen on exam currently- has anterior incision glued on anterior cervical neck- looks good- no drainage/no erythema    Assessment/Plan: 1. Functional deficits secondary to cervical myelopathy which require 3+ hours per day of interdisciplinary therapy in a comprehensive inpatient rehab setting.  Physiatrist is providing close team supervision and 24 hour management of active medical problems listed below.  Physiatrist and  rehab team continue to assess barriers to discharge/monitor patient progress toward functional and medical goals  Care Tool:  Bathing    Body parts bathed by patient: Right arm, Left arm, Chest, Abdomen, Front perineal area, Right upper leg, Left upper leg, Face   Body parts bathed by helper: Buttocks, Right lower leg, Left lower leg     Bathing assist Assist Level: Moderate Assistance - Patient 50 - 74%     Upper Body Dressing/Undressing Upper body dressing   What is the patient wearing?: Pull over shirt    Upper body assist Assist Level: Minimal Assistance - Patient > 75%    Lower Body Dressing/Undressing Lower body dressing      What is the patient wearing?: Incontinence brief     Lower body assist Assist for lower body dressing: Moderate Assistance - Patient 50 - 74%     Toileting Toileting    Toileting assist Assist for toileting: Maximal Assistance - Patient 25 - 49%     Transfers Chair/bed transfer  Transfers assist     Chair/bed transfer assist level: Moderate Assistance - Patient 50 - 74%     Locomotion Ambulation   Ambulation assist      Assist level: Minimal Assistance - Patient > 75% Assistive device: Walker-rolling Max distance: 12'   Walk 10 feet activity   Assist     Assist level: Minimal Assistance - Patient > 75% Assistive device: Walker-rolling   Walk 50 feet activity   Assist Walk 50 feet with 2 turns activity did not occur: Safety/medical concerns(decreased strength/activity tolerance)         Walk 150 feet activity   Assist Walk 150 feet activity did not occur: Safety/medical concerns(decreased strength/activity tolerance)         Walk 10 feet on uneven surface  activity   Assist Walk  10 feet on uneven surfaces activity did not occur: Safety/medical concerns(decreased strength/activity tolerance)         Wheelchair     Assist   Type of Wheelchair: Manual    Wheelchair assist level:  Supervision/Verbal cueing Max wheelchair distance: 67'    Wheelchair 50 feet with 2 turns activity    Assist        Assist Level: Supervision/Verbal cueing   Wheelchair 150 feet activity     Assist  Wheelchair 150 feet activity did not occur: Safety/medical concerns(decreased strength/activity tolerance)       Blood pressure 122/79, pulse 65, temperature 97.9 F (36.6 C), resp. rate 16, height 5\' 6"  (1.676 m), weight 43.5 kg, SpO2 98 %.  Medical Problem List and Plan: 1.  Decrease in strength, mobility and coordination secondary to cervical synovial cyst, spinal cord compression, cervical spondylolisthesis with radiculopathy C3-4.S/P posterior cervical laminectomy for synovial cyst resection, posterior cervical arthrodesis with anterior instrumentation 01/19/2019.  No cervical brace required             -Will discuss with surgery team to determine when patient may shower.             -ELOS/Goals: Mod I PT and OT, I SLP 2.  Antithrombotics: -DVT/anticoagulation: SCDs.  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             -antiplatelet therapy: N/A 3. Pain Management: Celebrex 200 mg every 12 hours, Neurontin 600 mg 3 times daily, oxycodone/tramadol and Valium as needed 4. Mood: Provide emotional support             -antipsychotic agents: N/A 5. Neuropsych: This patient is capable of making decisions on her own behalf. 6. Skin/Wound Care: Routine skin checks 7. Fluids/Electrolytes/Nutrition: Routine in and outs with follow-up chemistries 8.  Multiple sclerosis.  Diagnosed 3 years ago.  Neurology services.  Continue Inderal 60 mg twice daily. Recommend starting Baclofen 5mg  TID for spasticity.   11/24- will start Baclofen 5 mg TID for tone/spasms.  9.  Tobacco abuse.  NicoDerm  patch 10.  Neurogenic bowel bladder.  Modified bowel program.  Check PVR  11/24- will change caths/bladder scans to q6 hours and cath if volumes >200cc; will d/w pt if needs bowel program due to MS/new incomplete cervical myelopathy.  11/25- needs to have BM- if doesn't today, please give suppository. Isn't getting cathed based on flowsheet documentation.       LOS: 2 days A FACE TO FACE EVALUATION WAS PERFORMED  Chrishauna Mee 01/24/2019, 8:05 AM

## 2019-01-24 NOTE — Progress Notes (Signed)
Occupational Therapy Session Note  Patient Details  Name: SAANVI HAKALA MRN: [id number] Date of Birth: 02-Mar-1955  Today's Date: 01/24/2019 OT Individual Time: 0700-0758 OT Individual Time Calculation (min): 58 min    Short Term Goals: Week 1:  OT Short Term Goal 1 (Week 1): Pt will complete LB dressing with min assist with AE as needed OT Short Term Goal 2 (Week 1): Pt will complete toilet transfers min assist with RW 3/5 attempts OT Short Term Goal 3 (Week 1): Pt will complete 1 grooming task in standing to demonstrate increased standing tolerance  Skilled Therapeutic Interventions/Progress Updates:    1:1. Pt received In bed with 4/10 pain already medicatrion administered. Pt agtreeable to sitting EOB fotr meal with min A for sitting balance. Pt completes self feeding with weighted utensils seated EOB with VC for use of RTUE to hold container and LUE to peel off lids. Pt educated on weighted mug with lid to impove cleanliness,safety and independence when drinking ho coffee. Pt reports liking mug and feeling much better about self feeding with her new "mess kit." Pt reporting increased pain in neck d/t no support. Pt stand pvot transfer to recliner with RW and MIN A with VC for posture. Pt reporting relief with repositioning. Pt dons pants sitting in recliner with VC for threading RLE first d/t weakness. Pt able to thread BLE with min A for sitting balance and requires MOD A for standing balance while advancing pants past hips. A to don teds, hold LLE Into figure 4 while pt dons socks and A to don R sock. Exited sessionw iht tp setaed in bed exit alarm ona nd call light in reach  Therapy Documentation Precautions:  Precautions Precautions: Fall, Cervical Precaution Booklet Issued: Yes (comment) Precaution Comments: reviewed log roll technique and all cervical precautions Required Braces or Orthoses: (no brace needed per orders) Restrictions Weight Bearing Restrictions: No General:    Vital Signs:   Pain: Pain Assessment Pain Scale: 0-10 Pain Score: 4  Pain Intervention(s): Medication (See eMAR) ADL: ADL Grooming: Minimal assistance Where Assessed-Grooming: Sitting at sink Upper Body Bathing: Minimal assistance Where Assessed-Upper Body Bathing: Sitting at sink Lower Body Bathing: Moderate assistance Where Assessed-Lower Body Bathing: Sitting at sink, Standing at sink Upper Body Dressing: Minimal assistance Where Assessed-Upper Body Dressing: Sitting at sink Lower Body Dressing: Maximal assistance Where Assessed-Lower Body Dressing: Sitting at sink, Standing at sink Toileting: Maximal assistance Where Assessed-Toileting: Bedside Commode Toilet Transfer: Moderate assistance Toilet Transfer Method: Stand pivot Toilet Transfer Equipment: Art gallery manager    Praxis   Exercises:   Other Treatments:     Therapy/Group: Individual Therapy  Tonny Branch 01/24/2019, 7:20 AM

## 2019-01-25 NOTE — IPOC Note (Addendum)
Overall Plan of Care [facility]) Patient Details Name: Faith [name] MRN: [id number] DOB: 08/03/1955  Admitting Diagnosis: Cervical myelopathy [facility])  Hospital Problems: Principal Problem:   Cervical myelopathy ([place]) Active Problems:   Multiple sclerosis ([place])     Functional Problem List: Nursing Motor, Sensory  PT Balance, Edema, Endurance, Motor, Nutrition, Pain, Perception, Safety, Sensory, Skin Integrity  OT Balance, Endurance, Motor, Pain, Safety  SLP    TR         Basic ADL's: OT Grooming, Bathing, Dressing, Toileting     Advanced  ADL's: OT Laundry     Transfers: PT Bed Mobility, Bed to Chair, Car, [employer], Futures trader, Metallurgist: PT Ambulation, Emergency planning/management officer, Stairs     Additional Impairments: OT None  SLP        TR      Anticipated Outcomes Item Anticipated Outcome  Self Feeding    Swallowing      Basic self-care  Media planner Transfers Supervision  Bowel/Bladder  Patient will maintain pre-admission bowel and bladder function  Transfers  mod I with LRAD  Locomotion  supervision with LRAD 50 feet  Communication     Cognition     Pain  Pain to be at or below a level 4  Safety/Judgment  Patient will remain free of injury during this admission   Therapy Plan: PT Intensity: Minimum of 1-2 x/day ,45 to 90 minutes PT Frequency: 5 out of 7 days PT Duration Estimated Length of Stay: 2-2.5 weeks OT Intensity: Minimum of 1-2 x/day, 45 to 90 minutes OT Frequency: 5 out of 7 days OT Duration/Estimated Length of Stay: 14-18     Due to the current state of emergency, patients may not be receiving their 3-hours of Medicare-mandated therapy.   Team Interventions: Nursing Interventions Patient/Family Education, Bladder Management, Bowel Management, Pain Management, Skin Care/Wound Management, Discharge Planning  PT interventions Ambulation/gait training, Discharge planning, Functional  mobility training, Psychosocial support, Therapeutic Activities, Balance/vestibular training, Disease management/prevention, Neuromuscular re-education, Skin care/wound management, Therapeutic Exercise, Wheelchair propulsion/positioning, DME/adaptive equipment instruction, Pain management, Splinting/orthotics, UE/LE Strength taining/ROM, Community reintegration, Technical sales engineer stimulation, Patient/family education, IT trainer, UE/LE Coordination activities  OT Interventions Training and development officer, Academic librarian, Discharge planning, Disease mangement/prevention, Engineer, drilling, Functional mobility training, Patient/family education, Psychosocial support, Neuromuscular re-education, Self Care/advanced ADL retraining, Therapeutic Activities, Therapeutic Exercise, UE/LE Strength taining/ROM, UE/LE Coordination activities, Wheelchair propulsion/positioning  SLP Interventions    TR Interventions    SW/CM Interventions Discharge Planning, Psychosocial Support, Patient/Family Education   Barriers to Discharge MD  Medical stability, Incontinence, Neurogenic bowel and bladder, Weight and MS x 3 years  Nursing      PT Home environment access/layout, Other (comments) Patient has 4 STE with a L rail, she is open to putting a ramp in and was looking into having one installed PTA, she has a significant history of falls reported 1-7 per month this past year  OT Home environment access/layout bathroom not accessible via [place]       Team Discharge Planning: Destination: PT-Home ,OT- Home , SLP-  Projected Follow-up: PT-Home health PT, OT-  Home health OT, SLP-  Projected Equipment Needs: PT-To be determined, OT- To be determined, SLP-  Equipment Details: PT-Patient has a RW, rollator, and a hospital bed at home, OT-  Patient/family involved in discharge planning: PT- Patient,  OT-Patient, SLP-   MD ELOS: 2-2.5 weeks Medical Rehab  Prognosis:   Good Assessment: Patient is a 63 yr old female with MS x 3 years with new incomplete cervical myelopathy s/p C3-4 post cervical lami and arthrodesis with anterior instrumentation on 11/20- increased risk of DVT on SQ Heparin; started on Baclofen for spasticity; with incomplete neurogenic bladder and bowel- needing some assistance to empty. Goals are supervision to mod I.   See Team Conference Notes for weekly updates to the plan of care

## 2019-01-25 NOTE — Progress Notes (Signed)
[facility] PHYSICAL MEDICINE & REHABILITATION PROGRESS NOTE   Subjective/Complaints:  Pt reports bowels kicked in yesterday, finally- went 3x and feels much better.  Denies feeling constipation anymore.  ROS- denies SOB, CP, N/V/D.    Objective:   Vas [place] Lower Extremity Venous (dvt)  Result Date: 01/23/2019  Lower Venous Study Indications: Swelling.  Comparison Study: no prior Performing Technologist: Abram Sander RVS  Examination Guidelines: A complete evaluation includes B-mode imaging, spectral Doppler, color Doppler, and power Doppler as needed of all accessible portions of each vessel. Bilateral testing is considered an integral part of a complete examination. Limited examinations for reoccurring indications may be performed as noted.  +---------+---------------+---------+-----------+----------+--------------+ RIGHT    CompressibilityPhasicitySpontaneityPropertiesThrombus Aging +---------+---------------+---------+-----------+----------+--------------+ CFV      Full           Yes      Yes                                 +---------+---------------+---------+-----------+----------+--------------+ SFJ      Full                                                        +---------+---------------+---------+-----------+----------+--------------+ FV Prox  Full                                                        +---------+---------------+---------+-----------+----------+--------------+ FV Mid   Full                                                        +---------+---------------+---------+-----------+----------+--------------+ FV DistalFull                                                        +---------+---------------+---------+-----------+----------+--------------+ PFV      Full                                                        +---------+---------------+---------+-----------+----------+--------------+ POP      Full           Yes      Yes                                  +---------+---------------+---------+-----------+----------+--------------+ PTV      Full                                                        +---------+---------------+---------+-----------+----------+--------------+  PERO                                                  Not visualized +---------+---------------+---------+-----------+----------+--------------+   +---------+---------------+---------+-----------+----------+--------------+ LEFT     CompressibilityPhasicitySpontaneityPropertiesThrombus Aging +---------+---------------+---------+-----------+----------+--------------+ CFV      Full           Yes      Yes                                 +---------+---------------+---------+-----------+----------+--------------+ SFJ      Full                                                        +---------+---------------+---------+-----------+----------+--------------+ FV Prox  Full                                                        +---------+---------------+---------+-----------+----------+--------------+ FV Mid   Full                                                        +---------+---------------+---------+-----------+----------+--------------+ FV DistalFull                                                        +---------+---------------+---------+-----------+----------+--------------+ PFV      Full                                                        +---------+---------------+---------+-----------+----------+--------------+ POP      Full           Yes      Yes                                 +---------+---------------+---------+-----------+----------+--------------+ PTV      Full                                                        +---------+---------------+---------+-----------+----------+--------------+ PERO                                                  Not visualized  +---------+---------------+---------+-----------+----------+--------------+  Summary: Right: There is no evidence of deep vein thrombosis in the lower extremity. No cystic structure found in the popliteal fossa. Left: There is no evidence of deep vein thrombosis in the lower extremity. A cystic structure is found in the popliteal fossa.  *See table(s) above for measurements and observations. Electronically signed by Deitra Mayo MD on 01/23/2019 at 4:29:38 PM.    Final    Recent Labs    01/23/19 0441  WBC 9.6  HGB 15.9*  HCT 45.2  PLT 263   Recent Labs    01/23/19 0441  NA 136  K 3.8  CL 104  CO2 24  GLUCOSE 104*  BUN 11  CREATININE 0.47  CALCIUM 8.9    Intake/Output Summary (Last 24 hours) at 01/25/2019 0837 Last data filed at 01/25/2019 0738 Gross per 24 hour  Intake 802 ml  Output 300 ml  Net 502 ml     Physical Exam: Vital Signs Blood pressure 116/68, pulse (!) 56, temperature 97.7 F (36.5 C), resp. rate 16, height 5\' 6"  (1.676 m), weight 43.5 kg, SpO2 98 %. Vitals and labs reviewed- Gen- awake, alert, appropriate, sitting up in bed, eating breakfast;; NAD HEENT- conjugate gaze; no facial assymmetry CV- RRR Pulm- CTA B/L GI- soft, NT, ND;  (+)BS MS- tested UEs today- didn't want to disturb bedpan so didn't check LEs- LUE- deltoid/bicep/tricep 4/5; WE/grip/finger abd 4-/5 RUE- Delt/bicep/tricep 4-/5 and WE/grip and finger abd 3+/5 Skin- no skin breakdown seen on exam currently- has anterior incision glued on anterior cervical neck- looks good- no drainage/no erythema    Assessment/Plan: 1. Functional deficits secondary to cervical myelopathy which require 3+ hours per day of interdisciplinary therapy in a comprehensive inpatient rehab setting.  Physiatrist is providing close team supervision and 24 hour management of active medical problems listed below.  Physiatrist and rehab team continue to assess barriers to discharge/monitor patient progress  toward functional and medical goals  Care Tool:  Bathing    Body parts bathed by patient: Right arm, Left arm, Chest, Abdomen, Front perineal area, Right upper leg, Left upper leg, Face   Body parts bathed by helper: Buttocks, Right lower leg, Left lower leg     Bathing assist Assist Level: Moderate Assistance - Patient 50 - 74%     Upper Body Dressing/Undressing Upper body dressing   What is the patient wearing?: Hospital gown only    Upper body assist Assist Level: Minimal Assistance - Patient > 75%    Lower Body Dressing/Undressing Lower body dressing      What is the patient wearing?: Incontinence brief     Lower body assist Assist for lower body dressing: Moderate Assistance - Patient 50 - 74%     Toileting Toileting    Toileting assist Assist for toileting: Minimal Assistance - Patient > 75%     Transfers Chair/bed transfer  Transfers assist     Chair/bed transfer assist level: Contact Guard/Touching assist Chair/bed transfer assistive device: Programmer, multimedia   Ambulation assist      Assist level: Minimal Assistance - Patient > 75% Assistive device: Walker-rolling Max distance: 28'   Walk 10 feet activity   Assist     Assist level: Minimal Assistance - Patient > 75% Assistive device: Walker-rolling   Walk 50 feet activity   Assist Walk 50 feet with 2 turns activity did not occur: Safety/medical concerns(decreased strength/activity tolerance)         Walk 150 feet activity   Assist Walk 150  feet activity did not occur: Safety/medical concerns(decreased strength/activity tolerance)         Walk 10 feet on uneven surface  activity   Assist Walk 10 feet on uneven surfaces activity did not occur: Safety/medical concerns(decreased strength/activity tolerance)         Wheelchair     Assist   Type of Wheelchair: Manual    Wheelchair assist level: Supervision/Verbal cueing Max wheelchair distance: 72'     Wheelchair 50 feet with 2 turns activity    Assist        Assist Level: Supervision/Verbal cueing   Wheelchair 150 feet activity     Assist  Wheelchair 150 feet activity did not occur: Safety/medical concerns(decreased strength/activity tolerance)       Blood pressure 116/68, pulse (!) 56, temperature 97.7 F (36.5 C), resp. rate 16, height 5\' 6"  (1.676 m), weight 43.5 kg, SpO2 98 %.  Medical Problem List and Plan: 1.  Decrease in strength, mobility and coordination secondary to cervical synovial cyst, spinal cord compression, cervical spondylolisthesis with radiculopathy C3-4.S/P posterior cervical laminectomy for synovial cyst resection, posterior cervical arthrodesis with anterior instrumentation 01/19/2019.  No cervical brace required             -Will discuss with surgery team to determine when patient may shower.             -ELOS/Goals: Mod I PT and OT, I SLP 2.  Antithrombotics: -DVT/anticoagulation: SCDs.  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             -antiplatelet therapy: N/A 3. Pain Management: Celebrex 200 mg every 12 hours, Neurontin 600 mg 3 times daily, oxycodone/tramadol and Valium as needed 4. Mood: Provide emotional support             -antipsychotic agents: N/A 5. Neuropsych: This patient is capable of making decisions on her own behalf. 6. Skin/Wound Care: Routine skin checks 7. Fluids/Electrolytes/Nutrition: Routine in and outs with follow-up chemistries 8.  Multiple sclerosis.  Diagnosed 3 years ago.  Neurology services.  Continue Inderal 60 mg twice daily. Recommend starting Baclofen 5mg  TID for spasticity.   11/24- will start Baclofen 5 mg TID for tone/spasms.  9.  Tobacco abuse.  NicoDerm patch 10.  Neurogenic bowel bladder.  Modified bowel  program.  Check PVR  11/24- will change caths/bladder scans to q6 hours and cath if volumes >200cc; will d/w pt if needs bowel program due to MS/new incomplete cervical myelopathy.  11/25- needs to have BM- if doesn't today, please give suppository. Isn't getting cathed based on flowsheet documentation.   11/26- Had 3 Bms yesterday- no more constipation      LOS: 3 days A FACE TO FACE EVALUATION WAS PERFORMED  Kimberlee Shoun 01/25/2019, 8:37 AM

## 2019-01-26 ENCOUNTER — Inpatient Hospital Stay (HOSPITAL_COMMUNITY): Payer: Medicaid Other

## 2019-01-26 NOTE — Progress Notes (Signed)
Physical Therapy Session Note  Patient Details  Name: Faith [name] MRN: [id number] Date of Birth: Aug 11, 1955  Today's Date: 01/26/2019 PT Individual Time: 0900-1010 PT Individual Time Calculation (min): 70 min   Short Term Goals: Week 1:  PT Short Term Goal 1 (Week 1): Patient will perform bed mobility with supervision. PT Short Term Goal 2 (Week 1): Patient will perform basic transfers with CGA using LRAD. PT Short Term Goal 3 (Week 1): Patient will perform dynamic standing balance activities with CGA. PT Short Term Goal 4 (Week 1): Patient will ambulate >30 feet using LRAD with CGA.  Skilled Therapeutic Interventions/Progress Updates:    Pt seated in recliner upon PT arrival, agreeable to therapy tx and reports pain 10/10 in neck and shoulders with activity. Pt performed stand pivot to w/c with min assist and transported to the gym. Pt performed stand pivot to the mat with min assist. Pt performed sit<>stand this session without AD min assist, in standing worked on static standing balance without UE support, worked on standing balance with perturbations from therapist, worked on Environmental education officer to performed 2 x 5 mini squats with intermittent UE support on RW and cues for techniques. Pt reports increased pain in neck/shoulders, pt transferred to supine position for pain relief, supervision. In supine pt performed LE therex this session for strengthening, 2 x 10 each with cues for techniques: bridges, heel slides, and hip abduction. Pt reports having to use the bathroom, transferred to sitting with min assist and performed stand pivot to w/c with min assist. Pt transported back to room, stand pivot from w/c<>toilet with min assist, min assist for standing balance while pt performed clothing management/pericare, pt continent of bowel/bladder. Pt seated in w/c at sink to wash hands. Pt transported back to gym, worked on UE fine motor coordination this session to complete peg board puzzle. Pt  ambulated x 15 ft this session with RW and min assist, cues for step length, foot placement and upright posture Transported back to room and transferred to bed stand pivot with CGA, sit>supine with supervision and left with needs in reach and bed alarm set.   Therapy Documentation Precautions:  Precautions Precautions: Fall, Cervical Precaution Booklet Issued: Yes (comment) Precaution Comments: reviewed log roll technique and all cervical precautions Required Braces or Orthoses: (no brace needed per orders) Restrictions Weight Bearing Restrictions: No    Therapy/Group: Individual Therapy  Netta Corrigan, PT, DPT, CSRS 01/26/2019, 7:55 AM

## 2019-01-26 NOTE — Progress Notes (Signed)
Occupational Therapy Session Note  Patient Details  Name: Faith [name] MRN: [id number] Date of Birth: [birth year]  Today's Date: 01/26/2019 OT Individual Time: 1345-1430 OT Individual Time Calculation (min): 45 min    Short Term Goals: Week 1:  OT Short Term Goal 1 (Week 1): Pt will complete LB dressing with min assist with AE as needed OT Short Term Goal 2 (Week 1): Pt will complete toilet transfers min assist with RW 3/5 attempts OT Short Term Goal 3 (Week 1): Pt will complete 1 grooming task in standing to demonstrate increased standing tolerance  Skilled Therapeutic Interventions/Progress Updates:    Pt resting in bed upon arrival and agreeable to therapy.  OT intervention with focus on bed moblity, sit<>stand, functional amb with RW, discharge planning, activity tolerance, and safety awareness to increase independence with BADLs. Supine>sit EOB with bed rails at supervision.  Stand pivot transfer with RW to w/c with CGA. Pt transitioned to ADL apartment.  Pt states her Rollator will not fit into bathroom but isn't sure if her standard walker will fit.  Son to be getting measurements.  Pt currently sleeps in recliner because her bed is an old hospital bed that isn't operating correctly. Pt amb with RW in apartment and in her room with CGA. Pt returned to recliner. Pt remained in recliner with all needs within reach and seat alarm activated.   Therapy Documentation Precautions:  Precautions Precautions: Fall, Cervical Precaution Booklet Issued: Yes (comment) Precaution Comments: reviewed log roll technique and all cervical precautions Required Braces or Orthoses: (no brace needed per orders) Restrictions Weight Bearing Restrictions: No Pain: Pain Assessment Pain Scale: 0-10 Pain Score: 2  Pain Type: Surgical pain Pain Location: Neck Pain Descriptors / Indicators: Aching Pain Frequency: Intermittent Pain Onset: On-going Patients Stated Pain Goal: 4 Pain Intervention(s):  repositioned  Therapy/Group: Individual Therapy  Leroy Libman 01/26/2019, 2:36 PM

## 2019-01-26 NOTE — Progress Notes (Signed)
[facility] PHYSICAL MEDICINE & REHABILITATION PROGRESS NOTE   Subjective/Complaints:  Pt reports hurting in L neck/shoulder area- was told had a bruise there that could be cause of pain.  No other issues- finishing OT currently.  ROS- denies SOB, CP, N/V/D.    Objective:   No results found. No results for input(s): WBC, HGB, HCT, PLT in the last 72 hours. No results for input(s): NA, K, CL, CO2, GLUCOSE, BUN, CREATININE, CALCIUM in the last 72 hours.  Intake/Output Summary (Last 24 hours) at 01/26/2019 0907 Last data filed at 01/26/2019 0600 Gross per 24 hour  Intake 560 ml  Output 1 ml  Net 559 ml     Physical Exam: Vital Signs Blood pressure (!) 142/84, pulse 70, temperature 97.8 F (36.6 C), resp. rate 19, height 5\' 6"  (1.676 m), weight 43.5 kg, SpO2 96 %. Vitals and labs reviewed- Gen- awake, alert, appropriate, sitting up in bedside chair; OT at bedside; NAD HEENT- conjugate gaze; no facial assymmetry CV- RRR Pulm- CTA B/L GI- soft, NT, ND;  (+)BS MS- tested UEs today- didn't want to disturb bedpan so didn't check LEs- LUE- deltoid/bicep/tricep 4/5; WE/grip/finger abd 4-/5 RUE- Delt/bicep/tricep 4-/5 and WE/grip and finger abd 3+/5 Skin- no skin breakdown seen on exam currently- has anterior incision glued on anterior cervical neck- looks good- no drainage/no erythema and posterior incision as well- both glued - also has angry purple/yellow bruise on L shoulder/neck area that appears uncomfortable- she doesn't know where it came from    Assessment/Plan: 1. Functional deficits secondary to cervical myelopathy which require 3+ hours per day of interdisciplinary therapy in a comprehensive inpatient rehab setting.  Physiatrist is providing close team supervision and 24 hour management of active medical problems listed below.  Physiatrist and rehab team continue to assess barriers to discharge/monitor patient progress toward functional and medical goals  Care  Tool:  Bathing    Body parts bathed by patient: Right arm, Left arm, Chest, Abdomen, Front perineal area, Buttocks, Right upper leg, Left upper leg, Face   Body parts bathed by helper: Right lower leg, Left lower leg     Bathing assist Assist Level: Minimal Assistance - Patient > 75%     Upper Body Dressing/Undressing Upper body dressing   What is the patient wearing?: Pull over shirt    Upper body assist Assist Level: Supervision/Verbal cueing    Lower Body Dressing/Undressing Lower body dressing      What is the patient wearing?: Incontinence brief, Pants     Lower body assist Assist for lower body dressing: Minimal Assistance - Patient > 75%     Toileting Toileting    Toileting assist Assist for toileting: Minimal Assistance - Patient > 75%     Transfers Chair/bed transfer  Transfers assist     Chair/bed transfer assist level: Contact Guard/Touching assist Chair/bed transfer assistive device: Programmer, multimedia   Ambulation assist      Assist level: Minimal Assistance - Patient > 75% Assistive device: Walker-rolling Max distance: 28'   Walk 10 feet activity   Assist     Assist level: Minimal Assistance - Patient > 75% Assistive device: Walker-rolling   Walk 50 feet activity   Assist Walk 50 feet with 2 turns activity did not occur: Safety/medical concerns(decreased strength/activity tolerance)         Walk 150 feet activity   Assist Walk 150 feet activity did not occur: Safety/medical concerns(decreased strength/activity tolerance)  Walk 10 feet on uneven surface  activity   Assist Walk 10 feet on uneven surfaces activity did not occur: Safety/medical concerns(decreased strength/activity tolerance)         Wheelchair     Assist   Type of Wheelchair: Manual    Wheelchair assist level: Supervision/Verbal cueing Max wheelchair distance: 41'    Wheelchair 50 feet with 2 turns  activity    Assist        Assist Level: Supervision/Verbal cueing   Wheelchair 150 feet activity     Assist  Wheelchair 150 feet activity did not occur: Safety/medical concerns(decreased strength/activity tolerance)       Blood pressure (!) 142/84, pulse 70, temperature 97.8 F (36.6 C), resp. rate 19, height 5\' 6"  (1.676 m), weight 43.5 kg, SpO2 96 %.  Medical Problem List and Plan: 1.  Decrease in strength, mobility and coordination secondary to cervical synovial cyst, spinal cord compression, cervical spondylolisthesis with radiculopathy C3-4.S/P posterior cervical laminectomy for synovial cyst resection, posterior cervical arthrodesis with anterior instrumentation 01/19/2019.  No cervical brace required             -Will discuss with surgery team to determine when patient may shower.  11/27- will let pt shower with covering incisions- told OT.             -ELOS/Goals: Mod I PT and OT, I SLP 2.  Antithrombotics: -DVT/anticoagulation: SCDs.  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             -antiplatelet therapy: N/A 3. Pain Management: Celebrex 200 mg every 12 hours, Neurontin 600 mg 3 times daily, oxycodone/tramadol and Valium as needed  11/27- pain usually controlled except over L shoulder bruise- which is significant.  4. Mood: Provide emotional support             -antipsychotic agents: N/A 5. Neuropsych: This patient is capable of making decisions on her own behalf. 6. Skin/Wound Care: Routine skin checks 7. Fluids/Electrolytes/Nutrition: Routine in and outs with follow-up chemistries 8.  Multiple sclerosis.  Diagnosed 3 years ago.  Neurology services.  Continue Inderal 60 mg twice daily. Recommend starting Baclofen 5mg  TID for spasticity.   11/24- will start Baclofen 5 mg  TID for tone/spasms.  9.  Tobacco abuse.  NicoDerm patch 10.  Neurogenic bowel/ bladder.  Modified bowel program.  Check PVR  11/24- will change caths/bladder scans to q6 hours and cath if volumes >200cc; will d/w pt if needs bowel program due to MS/new incomplete cervical myelopathy.  11/25- needs to have BM- if doesn't today, please give suppository. Isn't getting cathed based on flowsheet documentation.   11/26- Had 3 BMs yesterday- no more constipation      LOS: 4 days A FACE TO FACE EVALUATION WAS PERFORMED  Faith [name] 01/26/2019, 9:07 AM

## 2019-01-26 NOTE — Progress Notes (Signed)
Occupational Therapy Session Note  Patient Details  Name: Faith [name] MRN: [id number] Date of Birth: 02-26-1956  Today's Date: 01/26/2019 OT Individual Time: 0700-0810 OT Individual Time Calculation (min): 70 min    Short Term Goals: Week 1:  OT Short Term Goal 1 (Week 1): Pt will complete LB dressing with min assist with AE as needed OT Short Term Goal 2 (Week 1): Pt will complete toilet transfers min assist with RW 3/5 attempts OT Short Term Goal 3 (Week 1): Pt will complete 1 grooming task in standing to demonstrate increased standing tolerance  Skilled Therapeutic Interventions/Progress Updates:    Pt resting in bed upon arrival and agreeable to getting OOB for therapy.  OT intervention with focus on bed mobility, sit<>stand, standing balance, functional tranfsers, BADL retraining including bathing/dressing with sit<>stand from w/c at sink, activity tolerance, and safety awareness to increase independence with BADLs. Pt sat EOB and transferred to w/c with CGA.  Pt completed bathing/dressing tasks with sit<>stand from w/c at sink with CGA/supervision.  Pt required assistance donning [facility]. Pt commented that getting washed up and dressed was tiring  Pt required rest breaks during the session.  Pt transitioned to gym and engaged in standing tasks at hi-lo table-folding towels and wash cloths using BUE. Pt required CGA.  Pt issed 2 rubber balls to use to strengthen grasp.  Pt returned to room.  Pt remained in recliner with all needs within reach and seat alarm activated.   Therapy Documentation Precautions:  Precautions Precautions: Fall, Cervical Precaution Booklet Issued: Yes (comment) Precaution Comments: reviewed log roll technique and all cervical precautions Required Braces or Orthoses: (no brace needed per orders) Restrictions Weight Bearing Restrictions: No Pain:  Pt c/o back pain and R upper traps pain (unrated); repositioned, MD aware   Therapy/Group: Individual  Therapy  Leroy Libman 01/26/2019, 8:14 AM

## 2019-01-26 NOTE — Care Management (Signed)
[facility] Individual Statement of Services  Patient Name:  JAKOBI MAINWARING  Date:  01/26/2019  Welcome to the [facility].  Our goal is to provide you with an individualized program based on your diagnosis and situation, designed to meet your specific needs.  With this comprehensive rehabilitation program, you will be expected to participate in at least 3 hours of rehabilitation therapies Monday-Friday, with modified therapy programming on the weekends.  Your rehabilitation program will include the following services:  Physical Therapy (PT), Occupational Therapy (OT), 24 hour per day rehabilitation nursing, Therapeutic Recreaction (TR), Neuropsychology, Case Management (Social Worker), Rehabilitation Medicine, Nutrition Services and Pharmacy Services  Weekly team conferences will be held on Tuesdays to discuss your progress.  Your Social Worker will talk with you frequently to get your input and to update you on team discussions.  Team conferences with you and your family in attendance may also be held.  Expected length of stay: 10-14 days   Overall anticipated outcome: supervision  Depending on your progress and recovery, your program may change. Your Social Worker will coordinate services and will keep you informed of any changes. Your Social Worker's name and contact numbers are listed  below.  The following services may also be recommended but are not provided by the [facility] will be made to provide these services after discharge if needed.  Arrangements include referral to agencies that provide these services.  Your insurance has been verified to be:  Medicaid Your primary doctor is:  Anastasio Champion  Pertinent information will be shared with your doctor and your insurance company.  Social Worker:  [place], [place] or ([phone]   Information discussed with and copy given to patient by: Lennart Pall, 01/26/2019, 10:54 AM

## 2019-01-27 ENCOUNTER — Inpatient Hospital Stay (HOSPITAL_COMMUNITY): Payer: Medicaid Other

## 2019-01-27 NOTE — Progress Notes (Signed)
Physical Therapy Session Note  Patient Details  Name: Faith [name] MRN: [id number] Date of Birth: 05-Feb-1956  Today's Date: 01/27/2019 PT Individual Time: 1000-1045 PT Individual Time Calculation (min): 45 min   Short Term Goals: Week 1:  PT Short Term Goal 1 (Week 1): Patient will perform bed mobility with supervision. PT Short Term Goal 2 (Week 1): Patient will perform basic transfers with CGA using LRAD. PT Short Term Goal 3 (Week 1): Patient will perform dynamic standing balance activities with CGA. PT Short Term Goal 4 (Week 1): Patient will ambulate >30 feet using LRAD with CGA.  Skilled Therapeutic Interventions/Progress Updates:     Patient in w/c in room with RN upon PT arrival. Patient alert and agreeable to PT session. Patient reported 5/10 neck pain during session, RN made aware and provided pain medicine during session. PT provided repositioning, rest breaks, and distraction as pain interventions throughout session.   Therapeutic Activity: Bed Mobility: Patient performed sit to supine with supervision. Provided verbal cues for performing sit to side-lying then log rolling to supine for cervical precautions. Transfers: Patient performed sit to/from stand x5 and stand pivot to/from the [facility] x1 with min A-CGA. Provided verbal cues for reaching back to sit, patient was able to self correct hand placement RW to stand.  Gait Training:  Patient ambulated 20 and 22 feet using RW with CGA. Ambulated with decreased gait speed, step-through gait pattern with intermittent step-to leading with R, genu recurvatum in stance R>L, and decreased step height R>L. Provided verbal cues for increased quad activation in stance to reduce extensor thrust and increased gluteal activation for improved single limb stance stability.  Wheelchair Mobility:  Patient was transported in the w/c with total A throughout session for energy conservation and time management.  Neuromuscular Re-ed: Patient  performed the following exercises with focus on controlled muscle activation throughout to promote increased motor control with exercise and functional mobility: -mini squats x6 with focus on increased control at the knee while descending and ascending for reduced recurvatum in standing. -alternating step taps x6 at 6" steps with B UE support and mod-min A for balance with increased extensor thrust in stance -alternating step taps x8 at 3" steps with B UE support with min A-CGA for balance with improved knee control in stance Patient reported increased cervical pain with standing and seated activities, 6/10. Returned patient to the room to continue exercises in supine. -bridging x5, limited ROM to reduce pressure on patient's neck -supine B isometric hip abduction/adduction in hook-lying x5 with 5 sec holds -B SLR x5 with min A  Patient reported increased fatigue and requested to rest after exercises in supine. Patient in bed at end of session with breaks locked, bed alarm set, and all needs within reach.    Therapy Documentation Precautions:  Precautions Precautions: Fall, Cervical Precaution Booklet Issued: Yes (comment) Precaution Comments: reviewed log roll technique and all cervical precautions Required Braces or Orthoses: (no brace needed per orders) Restrictions Weight Bearing Restrictions: No General: PT Amount of Missed Time (min): 15 Minutes PT Missed Treatment Reason: Pain;Patient fatigue    Therapy/Group: Individual Therapy  Faith [name] PT, DPT  01/27/2019, 12:25 PM

## 2019-01-27 NOTE — Progress Notes (Signed)
Occupational Therapy Session Note  Patient Details  Name: Faith [name] MRN: [id number] Date of Birth: Mar 14, 1955  Today's Date: 01/27/2019 OT Individual Time: 1300-1355 OT Individual Time Calculation (min): 55 min    Short Term Goals: Week 1:  OT Short Term Goal 1 (Week 1): Pt will complete LB dressing with min assist with AE as needed OT Short Term Goal 2 (Week 1): Pt will complete toilet transfers min assist with RW 3/5 attempts OT Short Term Goal 3 (Week 1): Pt will complete 1 grooming task in standing to demonstrate increased standing tolerance  Skilled Therapeutic Interventions/Progress Updates:    OT intervention with focus on bed mobility, sit<>stand, standing balance, BUE gross and [place] tasks, activity tolerance, and safety awareness to increase independence with BADLs. Sit<>supined with supervision using bed rails and adhering to precautions. Sit<>stand and functional amb with CGA. Seated and standing activities with focus on gross and [place] tasks; large pegs grasp and place in hole, theraputty activities including removing small beads from putty in addition to BUE tasks of forming into ball and rolling into snake, clothes pins tasks with yellow, red, and blue clothes pin. Pt returned to bed.  All needs within reach and bed alarm activated.   Therapy Documentation Precautions:  Precautions Precautions: Fall, Cervical Precaution Booklet Issued: Yes (comment) Precaution Comments: reviewed log roll technique and all cervical precautions Required Braces or Orthoses: (no brace needed per orders) Restrictions Weight Bearing Restrictions: No    Pain: Pain Assessment Pain Score: 0-No pain   Therapy/Group: Individual Therapy  Leroy Libman 01/27/2019, 2:38 PM

## 2019-01-27 NOTE — Progress Notes (Signed)
Occupational Therapy Session Note  Patient Details  Name: Faith [name] MRN: [id number] Date of Birth: 1955-11-10  Today's Date: 01/27/2019 OT Individual Time: 0700-0800 OT Individual Time Calculation (min): 60 min    Short Term Goals: Week 1:  OT Short Term Goal 1 (Week 1): Pt will complete LB dressing with min assist with AE as needed OT Short Term Goal 2 (Week 1): Pt will complete toilet transfers min assist with RW 3/5 attempts OT Short Term Goal 3 (Week 1): Pt will complete 1 grooming task in standing to demonstrate increased standing tolerance  Skilled Therapeutic Interventions/Progress Updates:    Pt resting in bed upon arrival.  OT intervention with focus on bed mobility, sit<>stand, standing balance, funcitonal amb with RW, BADL training, activity tolerance, and safety awareness to increase independence with BADLs.   Supine>sit EOB with bed rails-supervision Sit<>stand-CGA Functional amb with RW short distance (5') to w/c and recliner-CGA Bathing with sit<>stand at sink-CGA Dressing-CGA and requires assistance with donning socks Standing balance-CGA  Pt stood at sink approx 90 seconds for peri hygiene and pulling up pants.   Pt requires more than a reasonable amount of time to complete tasks with multiple rest breaks.  Pt requires min verbal cues for sequencing and UE placement with sit<>stand.  Pt continues to c/o numbness in BUE, although improving.   Pt remained in recliner with all needs within reach and seat alarm activated.   Therapy Documentation Precautions:  Precautions Precautions: Fall, Cervical Precaution Booklet Issued: Yes (comment) Precaution Comments: reviewed log roll technique and all cervical precautions Required Braces or Orthoses: (no brace needed per orders) Restrictions Weight Bearing Restrictions: No   Pain:  Shoulder is feeling better   Therapy/Group: Individual Therapy  Leroy Libman 01/27/2019, 8:03 AM

## 2019-01-27 NOTE — Progress Notes (Signed)
[facility] PHYSICAL MEDICINE & REHABILITATION PROGRESS NOTE   Subjective/Complaints:  Pt reports today is her day off- cleaned up this AM at sink- will get shower in AM.   ROS- denies SOB, CP, N/V/D.    Objective:   No results found. No results for input(s): WBC, HGB, HCT, PLT in the last 72 hours. No results for input(s): NA, K, CL, CO2, GLUCOSE, BUN, CREATININE, CALCIUM in the last 72 hours.  Intake/Output Summary (Last 24 hours) at 01/27/2019 1104 Last data filed at 01/26/2019 1757 Gross per 24 hour  Intake 340 ml  Output -  Net 340 ml     Physical Exam: Vital Signs Blood pressure 126/79, pulse 60, temperature 97.7 F (36.5 C), resp. rate 19, height 5\' 6"  (1.676 m), weight 43.5 kg, SpO2 98 %. Vitals and labs reviewed- Gen- awake, alert, appropriate, sitting up in bedside chair; watching TV, NAD HEENT- conjugate gaze; no facial assymmetry CV- RRR Pulm- CTA B/L GI- soft, NT, ND;  (+)BS MS-  LUE- deltoid/bicep/tricep 4/5; WE/grip/finger abd 4-/5 RUE- Delt/bicep/tricep 4-/5 and WE/grip and finger abd 3+/5  RLE HF 2/5, KE 4-/5, DF/PF 4/5  LLE- HF 3/5, KE 4-/5, DF/PF 4/5 Skin- no skin breakdown seen on exam currently- has anterior incision glued on anterior cervical neck- looks good- no drainage/no erythema and posterior incision as well- both glued - also has angry purple/yellow bruise on L shoulder/neck area that appears uncomfortable- she doesn't know where it came from    Assessment/Plan: 1. Functional deficits secondary to cervical myelopathy which require 3+ hours per day of interdisciplinary therapy in a comprehensive inpatient rehab setting.  Physiatrist is providing close team supervision and 24 hour management of active medical problems listed below.  Physiatrist and rehab team continue to assess barriers to discharge/monitor patient progress toward functional and medical goals  Care Tool:  Bathing    Body parts bathed by patient: Right arm, Left arm,  Chest, Abdomen, Front perineal area, Buttocks, Right upper leg, Left upper leg, Face, Right lower leg, Left lower leg   Body parts bathed by helper: Right lower leg, Left lower leg     Bathing assist Assist Level: Contact Guard/Touching assist     Upper Body Dressing/Undressing Upper body dressing   What is the patient wearing?: Pull over shirt    Upper body assist Assist Level: Supervision/Verbal cueing    Lower Body Dressing/Undressing Lower body dressing      What is the patient wearing?: Underwear/pull up, Pants     Lower body assist Assist for lower body dressing: Contact Guard/Touching assist     Toileting Toileting    Toileting assist Assist for toileting: Minimal Assistance - Patient > 75%     Transfers Chair/bed transfer  Transfers assist     Chair/bed transfer assist level: Minimal Assistance - Patient > 75% Chair/bed transfer assistive device: Programmer, multimedia   Ambulation assist      Assist level: Minimal Assistance - Patient > 75% Assistive device: Walker-rolling Max distance: 15 ft   Walk 10 feet activity   Assist     Assist level: Minimal Assistance - Patient > 75% Assistive device: Walker-rolling   Walk 50 feet activity   Assist Walk 50 feet with 2 turns activity did not occur: Safety/medical concerns(decreased strength/activity tolerance)         Walk 150 feet activity   Assist Walk 150 feet activity did not occur: Safety/medical concerns(decreased strength/activity tolerance)         Walk  10 feet on uneven surface  activity   Assist Walk 10 feet on uneven surfaces activity did not occur: Safety/medical concerns(decreased strength/activity tolerance)         Wheelchair     Assist   Type of Wheelchair: Manual    Wheelchair assist level: Supervision/Verbal cueing Max wheelchair distance: 52'    Wheelchair 50 feet with 2 turns activity    Assist        Assist Level:  Supervision/Verbal cueing   Wheelchair 150 feet activity     Assist  Wheelchair 150 feet activity did not occur: Safety/medical concerns(decreased strength/activity tolerance)       Blood pressure 126/79, pulse 60, temperature 97.7 F (36.5 C), resp. rate 19, height 5\' 6"  (1.676 m), weight 43.5 kg, SpO2 98 %.  Medical Problem List and Plan: 1.  Decrease in strength, mobility and coordination secondary to cervical synovial cyst, spinal cord compression, cervical spondylolisthesis with radiculopathy C3-4.S/P posterior cervical laminectomy for synovial cyst resection, posterior cervical arthrodesis with anterior instrumentation 01/19/2019.  No cervical brace required             -Will discuss with surgery team to determine when patient may shower.  11/27- will let pt shower with covering incisions- told OT.             -ELOS/Goals: Mod I PT and OT, I SLP 2.  Antithrombotics: -DVT/anticoagulation: SCDs.  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             -antiplatelet therapy: N/A 3. Pain Management: Celebrex 200 mg every 12 hours, Neurontin 600 mg 3 times daily, oxycodone/tramadol and Valium as needed  11/27- pain usually controlled except over L shoulder bruise- which is significant.  4. Mood: Provide emotional support             -antipsychotic agents: N/A 5. Neuropsych: This patient is capable of making decisions on her own behalf. 6. Skin/Wound Care: Routine skin checks 7. Fluids/Electrolytes/Nutrition: Routine in and outs with follow-up chemistries 8.  Multiple sclerosis.  Diagnosed 3 years ago.  Neurology services.  Continue Inderal 60 mg twice daily. Recommend starting Baclofen 5mg  TID for spasticity.   11/24- will start Baclofen 5 mg TID for tone/spasms.  9.  Tobacco abuse.  NicoDerm  patch 10.  Neurogenic bowel/ bladder.  Modified bowel program.  Check PVR  11/24- will change caths/bladder scans to q6 hours and cath if volumes >200cc; will d/w pt if needs bowel program due to MS/new incomplete cervical myelopathy.  11/25- needs to have BM- if doesn't today, please give suppository. Isn't getting cathed based on flowsheet documentation.   11/26- Had 3 BMs yesterday- no more constipation      LOS: 5 days A FACE TO FACE EVALUATION WAS PERFORMED  Deliah Strehlow 01/27/2019, 11:04 AM

## 2019-01-28 ENCOUNTER — Inpatient Hospital Stay (HOSPITAL_COMMUNITY): Payer: Medicaid Other

## 2019-01-28 NOTE — Progress Notes (Signed)
Physical Therapy Session Note  Patient Details  Name: Faith [name] MRN: [id number] Date of Birth: 25-Jul-1955  Today's Date: 01/28/2019 PT Individual Time: 1105-1205 PT Individual Time Calculation (min): 60 min   Short Term Goals: Week 1:  PT Short Term Goal 1 (Week 1): Patient will perform bed mobility with supervision. PT Short Term Goal 2 (Week 1): Patient will perform basic transfers with CGA using LRAD. PT Short Term Goal 3 (Week 1): Patient will perform dynamic standing balance activities with CGA. PT Short Term Goal 4 (Week 1): Patient will ambulate >30 feet using LRAD with CGA.  Skilled Therapeutic Interventions/Progress Updates:     Patient in recliner in room upon PT arrival. Patient alert and agreeable to PT session. Patient reported 6-7/10 neck and B shoulder pain during session, RN made aware. PT provided repositioning, rest breaks, and distraction as pain interventions throughout session.   Therapeutic Activity: Bed Mobility: Patient performed sit to supine with supervision. Provided verbal cues for log rolling to reduce neck strain with mobility. Transfers: Patient performed stand pivot x2 and sit to/from stand x3 with close supervision for safety. Provided verbal cues for locking brakes of w/c before standing, leaning forward to stand, and controlling descent when sitting.  Gait Training:  Patient ambulated 21 feet, 39 feet, and 37 feet using RW with close supervision for safety. Ambulated with step-to with intermittent step-though gait pattern with decreased step height and length on R, narrow BOS, and intermittent B extensor thrust, worse with fatigue. Provided verbal cues for increased BOS, looking ahead, increased R step height, and increased quad activation in stance to reduce extensor thrust.  Manual Therapy: Performed soft tissue mobilization and trigger point release to B upper traps, scalenes, and rhomboids due to increased muscle tension and presence of small  trigger points on palpation. Patient reported decreased, 5/10, neck and pain after.  Patient in bed with B hot packs applied to each shoulder at end of session with breaks locked, bed alarm set, and all needs within reach. Discussed progress with installing a ramp, her daughter and son are pricing wood and looking at borrowing a neighbors temporary ramp, educated on appropriate dimensions. Also discussed use of w/c at home and in the community when fatigued for increased safety to reduce falls, patient in agreement, but very motivated to continue ambulating as long as she can. PT encouraged patient to ambulate in the home at d/c when she is not fatigued and with a family member for safety, patient in agreement.    Therapy Documentation Precautions:  Precautions Precautions: Fall, Cervical Precaution Booklet Issued: Yes (comment) Precaution Comments: reviewed log roll technique and all cervical precautions Required Braces or Orthoses: (no brace needed per orders) Restrictions Weight Bearing Restrictions: No    Therapy/Group: Individual Therapy  Manasvi Dickard L Soliana Kitko PT, DPT  01/28/2019, 12:48 PM

## 2019-01-28 NOTE — Progress Notes (Signed)
[place] PHYSICAL MEDICINE & REHABILITATION PROGRESS NOTE   Subjective/Complaints:  Pt reports getting therapy at 9am to get shower with OT.  Denies any issues  ROS- denies SOB, CP, N/V/D.    Objective:   No results found. No results for input(s): WBC, HGB, HCT, PLT in the last 72 hours. No results for input(s): NA, K, CL, CO2, GLUCOSE, BUN, CREATININE, CALCIUM in the last 72 hours.  Intake/Output Summary (Last 24 hours) at 01/28/2019 1108 Last data filed at 01/28/2019 0803 Gross per 24 hour  Intake 460 ml  Output -  Net 460 ml     Physical Exam: Vital Signs Blood pressure 117/77, pulse 62, temperature 97.7 F (36.5 C), resp. rate 16, height 5\' 6"  (1.676 m), weight 43.5 kg, SpO2 99 %. Vitals and labs reviewed- Gen- awake, alert, appropriate, sitting up in bed; watching TV, NAD HEENT- conjugate gaze; no facial assymmetry CV- RRR Pulm- CTA B/L GI- soft, NT, ND;  (+)BS MS-  LUE- deltoid/bicep/tricep 4/5; WE/grip/finger abd 4-/5 RUE- Delt/bicep/tricep 4-/5 and WE/grip and finger abd 3+/5  RLE HF 2/5, KE 4-/5, DF/PF 4/5  LLE- HF 3/5, KE 4-/5, DF/PF 4/5 Skin- no skin breakdown seen on exam currently- has anterior incision glued on anterior cervical neck- looks good- no drainage/no erythema and posterior incision as well- both glued - also has angry purple/yellow bruise on L shoulder/neck area that appears uncomfortable- she doesn't know where it came from    Assessment/Plan: 1. Functional deficits secondary to cervical myelopathy which require 3+ hours per day of interdisciplinary therapy in a comprehensive inpatient rehab setting.  Physiatrist is providing close team supervision and 24 hour management of active medical problems listed below.  Physiatrist and rehab team continue to assess barriers to discharge/monitor patient progress toward functional and medical goals  Care Tool:  Bathing    Body parts bathed by patient: Right arm, Left arm, Chest, Abdomen, Front  perineal area, Buttocks, Right upper leg, Left upper leg, Face, Right lower leg, Left lower leg   Body parts bathed by helper: Right lower leg, Left lower leg     Bathing assist Assist Level: Contact Guard/Touching assist     Upper Body Dressing/Undressing Upper body dressing   What is the patient wearing?: Pull over shirt    Upper body assist Assist Level: Set up assist    Lower Body Dressing/Undressing Lower body dressing      What is the patient wearing?: Underwear/pull up, Pants     Lower body assist Assist for lower body dressing: Contact Guard/Touching assist     Toileting Toileting    Toileting assist Assist for toileting: Contact Guard/Touching assist     Transfers Chair/bed transfer  Transfers assist     Chair/bed transfer assist level: Contact Guard/Touching assist Chair/bed transfer assistive device: Walker, Clinical biochemist   Ambulation assist      Assist level: Minimal Assistance - Patient > 75% Assistive device: Walker-rolling Max distance: 22'   Walk 10 feet activity   Assist     Assist level: Minimal Assistance - Patient > 75% Assistive device: Walker-rolling   Walk 50 feet activity   Assist Walk 50 feet with 2 turns activity did not occur: Safety/medical concerns(decreased strength/activity tolerance)         Walk 150 feet activity   Assist Walk 150 feet activity did not occur: Safety/medical concerns(decreased strength/activity tolerance)         Walk 10 feet on uneven surface  activity   Assist  Walk 10 feet on uneven surfaces activity did not occur: Safety/medical concerns(decreased strength/activity tolerance)         Wheelchair     Assist   Type of Wheelchair: Manual    Wheelchair assist level: Supervision/Verbal cueing Max wheelchair distance: 97'    Wheelchair 50 feet with 2 turns activity    Assist        Assist Level: Supervision/Verbal cueing   Wheelchair 150 feet  activity     Assist  Wheelchair 150 feet activity did not occur: Safety/medical concerns(decreased strength/activity tolerance)       Blood pressure 117/77, pulse 62, temperature 97.7 F (36.5 C), resp. rate 16, height 5\' 6"  (1.676 m), weight 43.5 kg, SpO2 99 %.  Medical Problem List and Plan: 1.  Decrease in strength, mobility and coordination secondary to cervical synovial cyst, spinal cord compression, cervical spondylolisthesis with radiculopathy C3-4.S/P posterior cervical laminectomy for synovial cyst resection, posterior cervical arthrodesis with anterior instrumentation 01/19/2019.  No cervical brace required             -Will discuss with surgery team to determine when patient may shower.  11/27- will let pt shower with covering incisions- told OT.             -ELOS/Goals: Mod I PT and OT, I SLP 2.  Antithrombotics: -DVT/anticoagulation: SCDs.  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             -antiplatelet therapy: N/A 3. Pain Management: Celebrex 200 mg every 12 hours, Neurontin 600 mg 3 times daily, oxycodone/tramadol and Valium as needed  11/27- pain usually controlled except over L shoulder bruise- which is significant.  4. Mood: Provide emotional support             -antipsychotic agents: N/A 5. Neuropsych: This patient is capable of making decisions on her own behalf. 6. Skin/Wound Care: Routine skin checks 7. Fluids/Electrolytes/Nutrition: Routine in and outs with follow-up chemistries 8.  Multiple sclerosis.  Diagnosed 3 years ago.  Neurology services.  Continue Inderal 60 mg twice daily. Recommend starting Baclofen 5mg  TID for spasticity.   11/24- will start Baclofen 5 mg TID for tone/spasms.  9.  Tobacco abuse.  NicoDerm patch 10.  Neurogenic bowel/ bladder.  Modified  bowel program.  Check PVR  11/24- will change caths/bladder scans to q6 hours and cath if volumes >200cc; will d/w pt if needs bowel program due to MS/new incomplete cervical myelopathy.  11/25- needs to have BM- if doesn't today, please give suppository. Isn't getting cathed based on flowsheet documentation.   11/26- Had 3 BMs yesterday- no more constipation  11/29- says is more regular      LOS: 6 days A FACE TO FACE EVALUATION WAS PERFORMED  Faith [name] 01/28/2019, 11:08 AM

## 2019-01-28 NOTE — Progress Notes (Signed)
Occupational Therapy Session Note  Patient Details  Name: Faith [name] MRN: [id number] Date of Birth: 1955/10/12  Today's Date: 01/28/2019 OT Individual Time: 1330-1430 OT Individual Time Calculation (min): 60 min    Short Term Goals: Week 1:  OT Short Term Goal 1 (Week 1): Pt will complete LB dressing with min assist with AE as needed OT Short Term Goal 2 (Week 1): Pt will complete toilet transfers min assist with RW 3/5 attempts OT Short Term Goal 3 (Week 1): Pt will complete 1 grooming task in standing to demonstrate increased standing tolerance  Skilled Therapeutic Interventions/Progress Updates:    OT intervention with focus on table tasks for [name] and gross motor tasks. Box and Block Test-RUE 15, LUE 19; 9 hole peg test-RUE-unable to complete, LUE 1:33.03. Pt completed colored peg board pattern placing pegs with LUE and removing with RUE.  Pt assembled pvc pipe tree structure using BUE. Pt completed theraputty tasks with focus on grasp strength and finger flexion/extension. Pt returned to bed.  Pt remained in bed with all needs within reach and bed alarm activated.   Therapy Documentation Precautions:  Precautions Precautions: Fall, Cervical Precaution Booklet Issued: Yes (comment) Precaution Comments: reviewed log roll technique and all cervical precautions Required Braces or Orthoses: (no brace needed per orders) Restrictions Weight Bearing Restrictions: No Pain: Pain Assessment Pain Scale: 0-10 Pain Score: 3  Pain Type: Surgical pain Pain Location: Neck Pain Descriptors / Indicators: Aching Pain Frequency: Intermittent Pain Onset: On-going Pain Intervention(s): premedicated  Therapy/Group: Individual Therapy  Leroy Libman 01/28/2019, 2:44 PM

## 2019-01-28 NOTE — Progress Notes (Signed)
Occupational Therapy Session Note  Patient Details  Name: Faith [name] MRN: [id number] Date of Birth: [birth year]  Today's Date: 01/28/2019 OT Individual Time: 0900-1000 OT Individual Time Calculation (min): 60 min    Short Term Goals: Week 1:  OT Short Term Goal 1 (Week 1): Pt will complete LB dressing with min assist with AE as needed OT Short Term Goal 2 (Week 1): Pt will complete toilet transfers min assist with RW 3/5 attempts OT Short Term Goal 3 (Week 1): Pt will complete 1 grooming task in standing to demonstrate increased standing tolerance  Skilled Therapeutic Interventions/Progress Updates:    Pt resting in bed upon arrival and agreeable to therapy.  OT intervention with focus on bed mobility, standing balance, functional amb with RW, BADL retraining, activity tolerance, and safety awareness to increase independence with BADLs. Supervision for bed mobility using bed rails. Pt amb with RW to bathroom to use toilet before entering walk-in shower. CGA for amb and transfers. Pt completed bathing/dressing tasks per below. Pt able to don socks this morning. Pt also engaged in B hand tasks with theraputty to increase strength and FMC/gross motor function.  Educated pt on importance of looking at her hands when engaged in functional tasks.  Pt verbalized understanding.  Pt remained in recliner with all needs within reach and seat alarm activated.   Therapy Documentation Precautions:  Precautions Precautions: Fall, Cervical Precaution Booklet Issued: Yes (comment) Precaution Comments: reviewed log roll technique and all cervical precautions Required Braces or Orthoses: (no brace needed per orders) Restrictions Weight Bearing Restrictions: No Pain: Pain Assessment Pain Scale: 0-10 Pain Score: 4  Pain Type: Surgical pain Pain Location: Neck Pain Orientation: Lower Pain Descriptors / Indicators: Aching Pain Frequency: Intermittent Pain Onset: On-going Pain Intervention(s):  medication admin prior to therapy; after shower pt rated pain at 2 ADL: ADL Equipment Provided: Long-handled sponge Upper Body Bathing: Supervision/safety Where Assessed-Upper Body Bathing: Shower Lower Body Bathing: Contact guard Where Assessed-Lower Body Bathing: Shower Upper Body Dressing: Setup Where Assessed-Upper Body Dressing: Chair Lower Body Dressing: Contact guard Where Assessed-Lower Body Dressing: Chair Toileting: Contact guard Where Assessed-Toileting: Bedside Commode, Control and instrumentation engineer Transfer: Therapist, music Method: Arts development officer: Engineer, technical sales Transfer: Curator: Curator Method: Stand pivot Youth worker: Radio broadcast assistant, Grab bars   Therapy/Group: Individual Therapy  Leroy Libman 01/28/2019, 10:04 AM

## 2019-01-29 ENCOUNTER — Inpatient Hospital Stay (HOSPITAL_COMMUNITY): Payer: Medicaid Other

## 2019-01-29 LAB — CBC WITH DIFFERENTIAL/PLATELET
Abs Immature Granulocytes: 0.12 10*3/uL — ABNORMAL HIGH (ref 0.00–0.07)
Basophils Absolute: 0 10*3/uL (ref 0.0–0.1)
Basophils Relative: 1 %
Eosinophils Absolute: 0.1 10*3/uL (ref 0.0–0.5)
Eosinophils Relative: 1 %
HCT: 42.4 % (ref 36.0–46.0)
Hemoglobin: 14.6 g/dL (ref 12.0–15.0)
Immature Granulocytes: 1 %
Lymphocytes Relative: 29 %
Lymphs Abs: 2.4 10*3/uL (ref 0.7–4.0)
MCH: 34.1 pg — ABNORMAL HIGH (ref 26.0–34.0)
MCHC: 34.4 g/dL (ref 30.0–36.0)
MCV: 99.1 fL (ref 80.0–100.0)
Monocytes Absolute: 0.9 10*3/uL (ref 0.1–1.0)
Monocytes Relative: 10 %
Neutro Abs: 4.9 10*3/uL (ref 1.7–7.7)
Neutrophils Relative %: 58 %
Platelets: 325 10*3/uL (ref 150–400)
RBC: 4.28 MIL/uL (ref 3.87–5.11)
RDW: 12.3 % (ref 11.5–15.5)
WBC: 8.4 10*3/uL (ref 4.0–10.5)
nRBC: 0 % (ref 0.0–0.2)

## 2019-01-29 LAB — BASIC METABOLIC PANEL
Anion gap: 8 (ref 5–15)
BUN: 15 mg/dL (ref 8–23)
CO2: 31 mmol/L (ref 22–32)
Calcium: 9.4 mg/dL (ref 8.9–10.3)
Chloride: 100 mmol/L (ref 98–111)
Creatinine, Ser: 0.75 mg/dL (ref 0.44–1.00)
GFR calc Af Amer: >60 mL/min (ref >60)
GFR calc non Af Amer: >60 mL/min (ref >60)
Glucose, Bld: 89 mg/dL (ref 70–99)
Potassium: 4 mmol/L (ref 3.5–5.1)
Sodium: 139 mmol/L (ref 135–145)

## 2019-01-29 NOTE — Progress Notes (Addendum)
[place] PHYSICAL MEDICINE & REHABILITATION PROGRESS NOTE   Subjective/Complaints:  Denies any issues, Feeling "pretty good."  Her shoulders were hurting and nurse readjusted her this morning and provided her with rolled towel for support. She feels her discomfort is because of the way she slept last night. She had similar pains yesterday and they responded well to heating pad.  Had a BM 1 hour ago.   ROS- denies SOB, CP, N/V/D.    Objective:   No results found. Recent Labs    01/29/19 0549  WBC 8.4  HGB 14.6  HCT 42.4  PLT 325   Recent Labs    01/29/19 0549  NA 139  K 4.0  CL 100  CO2 31  GLUCOSE 89  BUN 15  CREATININE 0.75  CALCIUM 9.4    Intake/Output Summary (Last 24 hours) at 01/29/2019 0857 Last data filed at 01/29/2019 0730 Gross per 24 hour  Intake 600 ml  Output -  Net 600 ml     Physical Exam: Vital Signs Blood pressure 110/86, pulse 66, temperature (!) 97.5 F (36.4 C), resp. rate 16, height 5\' 6"  (1.676 m), weight 43.5 kg, SpO2 99 %. Vitals and labs reviewed- Gen- awake, alert, appropriate,lying in bed with rolled towel behind her neck. HEENT- conjugate gaze; no facial assymmetry CV- RRR Pulm- CTA B/L GI- soft, NT, ND;  (+)BS MS-  LUE- deltoid/bicep/tricep 4/5; WE/grip/finger abd 4-/5 RUE- Delt/bicep/tricep 4-/5 and WE/grip and finger abd 3+/5  RLE HF 3/5, KE 4-/5, DF/PF 4/5  LLE- HF 3/5, KE 4-/5, DF/PF 4/5 Skin- no skin breakdown seen on exam currently- has anterior incision glued on anterior cervical neck- looks good- no drainage/no erythema and posterior incision as well- both glued - also has angry purple/yellow bruise on L shoulder/neck area that appears uncomfortable- she doesn't know where it came from    Assessment/Plan: 1. Functional deficits secondary to cervical myelopathy which require 3+ hours per day of interdisciplinary therapy in a comprehensive inpatient rehab setting.  Physiatrist is providing close team supervision and  24 hour management of active medical problems listed below.  Physiatrist and rehab team continue to assess barriers to discharge/monitor patient progress toward functional and medical goals  Care Tool:  Bathing    Body parts bathed by patient: Right arm, Left arm, Chest, Abdomen, Front perineal area, Buttocks, Right upper leg, Left upper leg, Face, Right lower leg, Left lower leg   Body parts bathed by helper: Right lower leg, Left lower leg     Bathing assist Assist Level: Contact Guard/Touching assist     Upper Body Dressing/Undressing Upper body dressing   What is the patient wearing?: Pull over shirt    Upper body assist Assist Level: Set up assist    Lower Body Dressing/Undressing Lower body dressing      What is the patient wearing?: Underwear/pull up, Pants     Lower body assist Assist for lower body dressing: Contact Guard/Touching assist     Toileting Toileting    Toileting assist Assist for toileting: Contact Guard/Touching assist     Transfers Chair/bed transfer  Transfers assist     Chair/bed transfer assist level: Supervision/Verbal cueing Chair/bed transfer assistive device: Programmer, multimedia   Ambulation assist      Assist level: Supervision/Verbal cueing Assistive device: Walker-rolling Max distance: 39'   Walk 10 feet activity   Assist     Assist level: Supervision/Verbal cueing Assistive device: Walker-rolling   Walk 50 feet activity   Assist  Walk 50 feet with 2 turns activity did not occur: Safety/medical concerns(decreased strength/activity tolerance)         Walk 150 feet activity   Assist Walk 150 feet activity did not occur: Safety/medical concerns(decreased strength/activity tolerance)         Walk 10 feet on uneven surface  activity   Assist Walk 10 feet on uneven surfaces activity did not occur: Safety/medical concerns(decreased strength/activity tolerance)          Wheelchair     Assist   Type of Wheelchair: Manual    Wheelchair assist level: Supervision/Verbal cueing Max wheelchair distance: 60'    Wheelchair 50 feet with 2 turns activity    Assist        Assist Level: Supervision/Verbal cueing   Wheelchair 150 feet activity     Assist  Wheelchair 150 feet activity did not occur: Safety/medical concerns(decreased strength/activity tolerance)       Blood pressure 110/86, pulse 66, temperature (!) 97.5 F (36.4 C), resp. rate 16, height 5\' 6"  (1.676 m), weight 43.5 kg, SpO2 99 %.  Medical Problem List and Plan: 1.  Decrease in strength, mobility and coordination secondary to cervical synovial cyst, spinal cord compression, cervical spondylolisthesis with radiculopathy C3-4.S/P posterior cervical laminectomy for synovial cyst resection, posterior cervical arthrodesis with anterior instrumentation 01/19/2019.  No cervical brace required             -Will discuss with surgery team to determine when patient may shower.  11/27- will let pt shower with covering incisions- told OT.             -ELOS/Goals: Mod I PT and OT, I SLP 2.  Antithrombotics: -DVT/anticoagulation: SCDs.  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             -antiplatelet therapy: N/A 3. Pain Management: Celebrex 200 mg every 12 hours, Neurontin 600 mg 3 times daily, oxycodone/tramadol and Valium as needed  11/27- pain usually controlled except over L shoulder bruise- which is significant.   11/30: Ordered heating pad for bilateral shoulder pain--avoid application over bruise. 4. Mood: Provide emotional support             -antipsychotic agents: N/A 5. Neuropsych: This patient is capable of making decisions on her own behalf. 6. Skin/Wound Care: Routine skin  checks 7. Fluids/Electrolytes/Nutrition: Routine in and outs with follow-up chemistries. Labs stable on 11/30. 8.  Multiple sclerosis.  Diagnosed 3 years ago.  Neurology services.  Continue Inderal 60 mg twice daily. Recommend starting Baclofen 5mg  TID for spasticity.   11/24- will start Baclofen 5 mg TID for tone/spasms.  9.  Tobacco abuse.  NicoDerm patch 10.  Neurogenic bowel/ bladder.  Modified bowel program.  Check PVR  11/24- will change caths/bladder scans to q6 hours and cath if volumes >200cc; will d/w pt if needs bowel program due to MS/new incomplete cervical myelopathy.  11/25- needs to have BM- if doesn't today, please give suppository. Isn't getting cathed based on flowsheet documentation.   11/26- Had 3 BMs yesterday- no more constipation  11/29- says is more regular  11/30: with regular BM; had one this morning.      LOS: 7 days A FACE TO FACE EVALUATION WAS PERFORMED  Clide Deutscher Dickie Labarre 01/29/2019, 8:57 AM

## 2019-01-29 NOTE — Plan of Care (Signed)
  Problem: Activity: Goal: Ability to avoid complications of mobility impairment will improve Outcome: Progressing Goal: Ability to tolerate increased activity will improve Outcome: Progressing Goal: Will remain free from falls Outcome: Progressing   Problem: Clinical Measurements: Goal: Ability to maintain clinical measurements within normal limits will improve Outcome: Progressing Goal: Postoperative complications will be avoided or minimized Outcome: Progressing   Problem: Pain Management: Goal: Pain level will decrease Outcome: Progressing   Problem: Skin Integrity: Goal: Will show signs of wound healing Outcome: Progressing   Problem: Consults Goal: RH GENERAL PATIENT EDUCATION Description: See Patient Education module for education specifics. Outcome: Progressing   Problem: Consults Goal: RH SPINAL CORD INJURY PATIENT EDUCATION Description:  See Patient Education module for education specifics.  Outcome: Progressing Goal: Skin Care Protocol Initiated - if Braden Score 18 or less Description: If consults are not indicated, leave blank or document N/A Outcome: Progressing Goal: Nutrition Consult-if indicated Outcome: Progressing Goal: Diabetes Guidelines if Diabetic/Glucose > 140 Description: If diabetic or lab glucose is > 140 mg/dl - Initiate Diabetes/Hyperglycemia Guidelines & Document Interventions  Outcome: Progressing   Problem: SCI BOWEL ELIMINATION Goal: RH STG MANAGE BOWEL WITH ASSISTANCE Description: STG Manage Bowel with mod Assistance. Outcome: Progressing   Problem: SCI BLADDER ELIMINATION Goal: RH STG MANAGE BLADDER WITH ASSISTANCE Description: STG Manage Bladder With mod Assistance Outcome: Progressing   Problem: RH SKIN INTEGRITY Goal: RH STG SKIN FREE OF INFECTION/BREAKDOWN Description: Skin free from skin infection entire stay on rehab Outcome: Progressing   Problem: RH SAFETY Goal: RH STG ADHERE TO SAFETY PRECAUTIONS  W/ASSISTANCE/DEVICE Description: STG Adhere to Safety Precautions With  mod Assistance/Device. Outcome: Progressing   Problem: RH PAIN MANAGEMENT Goal: RH STG PAIN MANAGED AT OR BELOW PT'S PAIN GOAL Description: Pain below2 Outcome: Progressing

## 2019-01-29 NOTE — Progress Notes (Addendum)
Physical Therapy Session Note  Patient Details  Name: Faith [name] MRN: [id number] Date of Birth: [birth year]  Today's Date: 01/29/2019 PT Individual Time: 1000-1100 PT Individual Time Calculation (min): 60 min   Short Term Goals: Week 1:  PT Short Term Goal 1 (Week 1): Patient will perform bed mobility with supervision. PT Short Term Goal 2 (Week 1): Patient will perform basic transfers with CGA using LRAD. PT Short Term Goal 3 (Week 1): Patient will perform dynamic standing balance activities with CGA. PT Short Term Goal 4 (Week 1): Patient will ambulate >30 feet using LRAD with CGA.  Skilled Therapeutic Interventions/Progress Updates:     Patient in recliner in room upon PT arrival. Patient alert and agreeable to PT session. Patient reported 4/10 neck pain during session, RN made aware. PT provided repositioning, rest breaks, and distraction as pain interventions throughout session. She reported feeling very fatigued this morning due to standing for a long time to get cleaned up with OT.   Therapeutic Activity: Bed Mobility: Patient performed sit to supine with supervision for safetyin a flat bed without use of bed rails.  Transfers: Patient performed sit to/from stand x2 and stand pivot x3 with close supervision for safety. Provided verbal cues for pushing up with one hand on the arm rest or bed/mat table and one hand on the RW and controlling descent for safety.  Wheelchair Mobility:  Patient propelled wheelchair 45 feet and 35 feet with min A for steering, limited distance due to fatigue and increased neck and shoulder soreness. Provided verbal cues for equal UE propulsion and turning technique. Wrapped B spokes with blue band to provide increased grip with propulsion due to decreased B hand sensation.   Manual therapy: Performed soft tissue mobility and trigger point release to B upper traps, scalenes, and rhomboids following w/c mobility to reduce pain and soreness. Patient reported  2/10 neck/shoulder pain after.  Neuromuscular Re-ed: Patient performed B step taps on small cones 1x5, unable to reach the top with R, but increased height each trial. She also performed B step taps with a 4" step resting her foot on the top of the step 1x4, compensated with circumduction rather than knee flexion to lift foot to top of step, provided cues for technique. Required min A for balance with both activities.   Patient felt very fatigued throughout session and required frequent long sitting rest breaks between activities. When asked if she would be able to walk, she replied "I don't think I should." PT encouraged this response and educated on monitoring her fatigue and choosing when it appropriate and safe to spend energy walking versus using the w/c or saving an activity for a later time. Provided patient with handouts on fatigue management and energy conservation and reviewed them with her at end of session.  Patient in bed at end of session with breaks locked, bed alarm set, and all needs within reach.    Therapy Documentation Precautions:  Precautions Precautions: Fall, Cervical Precaution Booklet Issued: Yes (comment) Precaution Comments: reviewed log roll technique and all cervical precautions Required Braces or Orthoses: (no brace needed per orders) Restrictions Weight Bearing Restrictions: No    Therapy/Group: Individual Therapy  Harshaan Whang L Kenitha Glendinning PT, DPT  01/29/2019, 12:29 PM

## 2019-01-29 NOTE — Progress Notes (Signed)
Occupational Therapy Session Note  Patient Details  Name: Faith [name] MRN: [id number] Date of Birth: [birth year]  Today's Date: 01/29/2019 OT Individual Time: 0700-0810 OT Individual Time Calculation (min): 70 min    Short Term Goals: Week 2:  OT Short Term Goal 1 (Week 2): STG=LTG secondary to ELOS  Skilled Therapeutic Interventions/Progress Updates:    Pt resting in bed upon arrival and agreeable to therapy.  OT intervention with focus on bed mobility, sit<>stand, standing balance, w/c mobility, BADL traininig, toleting, activity tolerance, and safety awareness to increase independence with BADLs.  Supine>sit EOB-supervision with bed rails Sit<>stand-supervision/CGA Bathing with sit<>stand from w/c at sink-CGA LB dressing-CGA standing at sink  Pt amb with RW to w/c at sink with c/o dizziness. BP 112/82 HR 71. Educated pt on importance of gaining balance before walking and making slow transitional movements. Pt verbalized understanding.   Pt required more than a reasonable amount of time for LB bathing this morning.  Pt states she was not incontinent of bowel this morning but did have fecal residue in periarea which required thorough and extensive cleaning. Pt requested to use toilet after bathing/dressing and wanted to use w/c to propel towards bathroom.  Pt states she cannot feel w/c rim with her fingers.  We currently do not have appropriate size of w/c gloves. Pt completed toileting tasks with CGA.  Pt returned to recliner.  Pt remained in recliner with all needs within reach and seat alarm activated.   Therapy Documentation Precautions:  Precautions Precautions: Fall, Cervical Precaution Booklet Issued: Yes (comment) Precaution Comments: reviewed log roll technique and all cervical precautions Required Braces or Orthoses: (no brace needed per orders) Restrictions Weight Bearing Restrictions: No  Pain: Pt c/o 5/10 pain B shoulders/neck; RN notified and meds  admin   Therapy/Group: Individual Therapy  Leroy Libman 01/29/2019, 8:14 AM

## 2019-01-29 NOTE — Progress Notes (Signed)
Initial Nutrition Assessment  DOCUMENTATION CODES:   Underweight, Severe malnutrition in context of chronic illness  INTERVENTION:  - Radiation protection practitioner TID with meals, each supplement provides 240 kcal and 10 grams of protein  - Encourage adequate PO intake  NUTRITION DIAGNOSIS:   Severe Malnutrition related to chronic illness (MS, COPD) as evidenced by severe fat depletion, severe muscle depletion.  GOAL:   Patient will meet greater than or equal to 90% of their needs  MONITOR:   PO intake, Supplement acceptance, Weight trends, Skin  REASON FOR ASSESSMENT:   Other (underweight BMI)    ASSESSMENT:   63 year old female with PMH of COPD/tobacco abuse, back pain, multiple sclerosis diagnosed 3 years ago. Presented 01/17/19 with progressive weakness. MRI of the brain stable since 2019. Advanced chronic demyelinating disease with no progressive or active demyelination identified. MRI cervical spine despite the advanced chronic demyelinating disease the symptomatic abnormality appears to be degenerative in nature. 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 Associated acute exacerbation of the chronic facet arthropathy at C2-3 and 3-4. Pt initially seen by neurology services suspect multiple sclerosis exacerbation started on Solu-Medrol. On 01/19/19 patient patient reporting improvement paresthesia continued to have weakness. Neurosurgery consulted to review MRI cervical spine noting spinal cord compression/synovial cyst with noted cervical spondylolisthesis and pt underwent posterior cervical laminectomy for synovial cyst resection 01/19/19.   Spoke with pt at bedside. Pt reports that she is exhausted from therapies this morning.  Pt reports that her appetite is good and that she is "eating more here than I did at home." Pt states  that at home, she often didn't eat until later in the day because she did not feel up to getting up and making something. Pt reports that the family who she lives with tend to eat later than her, so she has to make her own food and often didn't feel like doing that. Pt believes this is what has contributed to her weight loss.  Pt states that her UBW is 105 lbs and that the most she has ever weighed is 119 lbs. Reviewed weight history in chart. Noted pt with a 7.2 kg weight loss since 04/04/18. This is a 14.2% weight loss in 10 months which is not quite significant for timeframe but is concerning. RD will monitor weight trends during admission.  Pt states that she was drinking 1 [employer] Instant Breakfast daily at home but had been instructed to consume 2-3 daily. Pt willing to drink [employer] Breakfast TID with meals. RD to order.  Meal Completion: 25-100% x last 8 meals (averaging 66%)  Medications reviewed and include: cholecalciferol  Labs reviewed.  NUTRITION - FOCUSED PHYSICAL EXAM:    Most Recent Value  Orbital Region  Severe depletion  Upper Arm Region  Moderate depletion  Thoracic and Lumbar Region  Severe depletion  Buccal Region  Moderate depletion  Temple Region  Severe depletion  Clavicle Bone Region  Severe depletion  Clavicle and Acromion Bone Region  Severe depletion  Scapular Bone Region  Unable to assess  Dorsal Hand  Moderate depletion  Patellar Region  Moderate depletion  Anterior Thigh Region  Moderate depletion  Posterior Calf Region  Moderate depletion  Edema (RD Assessment)  None  Hair  Reviewed  Eyes  Reviewed  Mouth  Reviewed  Skin  Reviewed  Nails  Reviewed       Diet Order:  Diet Order            Diet regular Room service appropriate? Yes; Fluid consistency: Thin  Diet effective now              EDUCATION NEEDS:   Education needs have been addressed  Skin:  Skin Assessment: Skin Integrity Issues: Skin Integrity  Issues: Incisions: neck  Last BM:  01/29/19 type 2  Height:   Ht Readings from Last 1 Encounters:  01/22/19 5\' 6"  (1.676 m)    Weight:   Wt Readings from Last 1 Encounters:  01/22/19 43.5 kg    Ideal Body Weight:  59.1 kg  BMI:  Body mass index is 15.46 kg/m.  Estimated Nutritional Needs:   Kcal:  1400-1600  Protein:  60-75 grams  Fluid:  >/= 1.4 L    Gaynell Face, MS, RD, LDN Inpatient Clinical Dietitian Pager: [phone] Weekend/After Hours: [phone]

## 2019-01-29 NOTE — Progress Notes (Signed)
Occupational Therapy Weekly Progress Note  Patient Details  Name: Faith [name] MRN: [id number] Date of Birth: Jul 20, 1955  Beginning of progress report period: January 23, 2019 End of progress report period: January 29, 2019  Patient has met 3 of 3 short term goals.  Pt is making steady progress with BADLs and functional transfers since admission.  Pt currently requires CGA with min verbal cues for safety awareness with sit<>stand. Pt requires CGA for standing balance, especially when fatigued.  Pt requires CGA for LB bathing/dressing tasks and functional transfers. Education ongoing regarding safety awareness and energy conservation strategies. Family has not been present for therapy sessions.   Patient continues to demonstrate the following deficits:  muscle weakness, decreased cardiorespiratoy endurance, unbalanced muscle activation, ataxia and decreased coordination and decreased standing balance, decreased postural control and decreased balance strategies and therefore will continue to benefit from skilled OT intervention to enhance overall performance with BADL and Reduce care partner burden.  Patient progressing toward long term goals..  Continue plan of care.  OT Short Term Goals Week 1:  OT Short Term Goal 1 (Week 1): Pt will complete LB dressing with min assist with AE as needed OT Short Term Goal 1 - Progress (Week 1): Met OT Short Term Goal 2 (Week 1): Pt will complete toilet transfers min assist with RW 3/5 attempts OT Short Term Goal 2 - Progress (Week 1): Met OT Short Term Goal 3 (Week 1): Pt will complete 1 grooming task in standing to demonstrate increased standing tolerance OT Short Term Goal 3 - Progress (Week 1): Met Week 2:  OT Short Term Goal 1 (Week 2): STG=LTG secondary to ELOS   Leroy Libman 01/29/2019, 6:29 AM

## 2019-01-29 NOTE — Progress Notes (Signed)
Occupational Therapy Session Note  Patient Details  Name: Faith [name] MRN: [id number] Date of Birth: 1955-03-07  Today's Date: 01/29/2019 OT Individual Time: 1345-1425 OT Individual Time Calculation (min): 40 min    Short Term Goals: Week 2:  OT Short Term Goal 1 (Week 2): STG=LTG secondary to ELOS  Skilled Therapeutic Interventions/Progress Updates:    Pt resting in bed upon arrival and agreeable to therapy.  Pt states she is still "worn out" from morning sessions and especially ADL session earlier in the morning. Pt c/o increased pain across shoulders and back.  Kinesio tape applied.  OT intervention with focus on sit<>stand without BUE support/assist.  Pt requires CGA/min A for task with extended rest breaks.  Pt returned to bed with all needs within reach and bed alarm activated.   Therapy Documentation Precautions:  Precautions Precautions: Fall, Cervical Precaution Booklet Issued: Yes (comment) Precaution Comments: reviewed log roll technique and all cervical precautions Required Braces or Orthoses: (no brace needed per orders) Restrictions Weight Bearing Restrictions: No  Pain: Pt states her shoulders and all across her back are painful; Kinesio tape applied and RN notified; repositioned Therapy/Group: Individual Therapy  Leroy Libman 01/29/2019, 2:36 PM

## 2019-01-30 ENCOUNTER — Inpatient Hospital Stay (HOSPITAL_COMMUNITY): Payer: Medicaid Other

## 2019-01-30 NOTE — Progress Notes (Signed)
[place] PHYSICAL MEDICINE & REHABILITATION PROGRESS NOTE   Subjective/Complaints:  Pt reports LBM yesterday AM- neck pain ~ 3/10- has Ktape on L shoulder by OT  Also notes hands moving better since 11:30 pm last night- better functionally. Marland Kitchen   ROS- denies SOB, CP, N/V/D.    Objective:   No results found. Recent Labs    01/29/19 0549  WBC 8.4  HGB 14.6  HCT 42.4  PLT 325   Recent Labs    01/29/19 0549  NA 139  K 4.0  CL 100  CO2 31  GLUCOSE 89  BUN 15  CREATININE 0.75  CALCIUM 9.4    Intake/Output Summary (Last 24 hours) at 01/30/2019 0908 Last data filed at 01/29/2019 1627 Gross per 24 hour  Intake 240 ml  Output -  Net 240 ml     Physical Exam: Vital Signs Blood pressure 115/71, pulse 63, temperature 98.6 F (37 C), resp. rate 18, height 5\' 6"  (1.676 m), weight 43.5 kg, SpO2 99 %. Vitals and labs reviewed- Gen- awake, alert, appropriate,lsitting up in manual w/c, working with OT: putting on TEDs/socks, NAD HEENT- conjugate gaze; no facial assymmetry CV- RRR Pulm- CTA B/L GI- soft, NT, ND;  (+)BS MS-  LUE- deltoid/bicep/tricep 4/5; WE/grip/finger abd 4-/5 RUE- Delt/bicep/tricep 4-/5 and WE/grip and finger abd 3+/5  - hands can make fists much better than last week  RLE HF 3/5, KE 4-/5, DF/PF 4/5  LLE- HF 3/5, KE 4-/5, DF/PF 4/5 Skin- no skin breakdown seen on exam currently- has anterior incision glued on anterior cervical neck- looks good- no drainage/no erythema and posterior incision as well- both glued - also has angry purple/yellow bruise on L shoulder/neck area that appears uncomfortable- she doesn't know where it came from    Assessment/Plan: 1. Functional deficits secondary to cervical myelopathy which require 3+ hours per day of interdisciplinary therapy in a comprehensive inpatient rehab setting.  Physiatrist is providing close team supervision and 24 hour management of active medical problems listed below.  Physiatrist and rehab team  continue to assess barriers to discharge/monitor patient progress toward functional and medical goals  Care Tool:  Bathing    Body parts bathed by patient: Right arm, Left arm, Chest, Abdomen, Front perineal area, Buttocks, Right upper leg, Left upper leg, Face, Right lower leg, Left lower leg   Body parts bathed by helper: Right lower leg, Left lower leg     Bathing assist Assist Level: Contact Guard/Touching assist     Upper Body Dressing/Undressing Upper body dressing   What is the patient wearing?: Pull over shirt    Upper body assist Assist Level: Set up assist    Lower Body Dressing/Undressing Lower body dressing      What is the patient wearing?: Underwear/pull up, Pants     Lower body assist Assist for lower body dressing: Contact Guard/Touching assist     Toileting Toileting    Toileting assist Assist for toileting: Contact Guard/Touching assist     Transfers Chair/bed transfer  Transfers assist     Chair/bed transfer assist level: Supervision/Verbal cueing Chair/bed transfer assistive device: Programmer, multimedia   Ambulation assist      Assist level: Supervision/Verbal cueing Assistive device: Walker-rolling Max distance: 39'   Walk 10 feet activity   Assist     Assist level: Supervision/Verbal cueing Assistive device: Walker-rolling   Walk 50 feet activity   Assist Walk 50 feet with 2 turns activity did not occur: Safety/medical concerns(decreased strength/activity tolerance)  Walk 150 feet activity   Assist Walk 150 feet activity did not occur: Safety/medical concerns(decreased strength/activity tolerance)         Walk 10 feet on uneven surface  activity   Assist Walk 10 feet on uneven surfaces activity did not occur: Safety/medical concerns(decreased strength/activity tolerance)         Wheelchair     Assist   Type of Wheelchair: Manual    Wheelchair assist level: Minimal Assistance -  Patient > 75% Max wheelchair distance: 75'    Wheelchair 50 feet with 2 turns activity    Assist        Assist Level: Supervision/Verbal cueing   Wheelchair 150 feet activity     Assist  Wheelchair 150 feet activity did not occur: Safety/medical concerns(decreased strength/activity tolerance)       Blood pressure 115/71, pulse 63, temperature 98.6 F (37 C), resp. rate 18, height 5\' 6"  (1.676 m), weight 43.5 kg, SpO2 99 %.  Medical Problem List and Plan: 1.  Decrease in strength, mobility and coordination secondary to cervical synovial cyst, spinal cord compression, cervical spondylolisthesis with radiculopathy C3-4.S/P posterior cervical laminectomy for synovial cyst resection, posterior cervical arthrodesis with anterior instrumentation 01/19/2019.  No cervical brace required             -Will discuss with surgery team to determine when patient may shower.  11/27- will let pt shower with covering incisions- told OT.             -ELOS/Goals: Mod I PT and OT, I SLP 2.  Antithrombotics: -DVT/anticoagulation: SCDs.  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             -antiplatelet therapy: N/A 3. Pain Management: Celebrex 200 mg every 12 hours, Neurontin 600 mg 3 times daily, oxycodone/tramadol and Valium as needed  11/27- pain usually controlled except over L shoulder bruise- which is significant.   11/30: Ordered heating pad for bilateral shoulder pain--avoid application over bruise. 4. Mood: Provide emotional support             -antipsychotic agents: N/A 5. Neuropsych: This patient is capable of making decisions on her own behalf. 6. Skin/Wound Care: Routine skin checks 7. Fluids/Electrolytes/Nutrition: Routine in and outs with follow-up chemistries. Labs stable on 11/30. 8.   Multiple sclerosis.  Diagnosed 3 years ago.  Neurology services.  Continue Inderal 60 mg twice daily. Recommend starting Baclofen 5mg  TID for spasticity.   11/24- will start Baclofen 5 mg TID for tone/spasms.  9.  Tobacco abuse.  NicoDerm patch 10.  Neurogenic bowel/ bladder.  Modified bowel program.  Check PVR  11/24- will change caths/bladder scans to q6 hours and cath if volumes >200cc; will d/w pt if needs bowel program due to MS/new incomplete cervical myelopathy.  11/25- needs to have BM- if doesn't today, please give suppository. Isn't getting cathed based on flowsheet documentation.   11/26- Had 3 BMs yesterday- no more constipation  11/29- says is more regular  11/30: with regular BM; had one this morning.      LOS: 8 days A FACE TO FACE EVALUATION WAS PERFORMED  Faith [name] 01/30/2019, 9:08 AM

## 2019-01-30 NOTE — Progress Notes (Signed)
Occupational Therapy Session Note  Patient Details  Name: LAKIAH JACKEL MRN: [id number] Date of Birth: [birth year]  Today's Date: 01/30/2019 OT Individual Time: 0700-0800 OT Individual Time Calculation (min): 60 min    Short Term Goals: Week 2:  OT Short Term Goal 1 (Week 2): STG=LTG secondary to ELOS  Skilled Therapeutic Interventions/Progress Updates:    OT intervention with focus on bed mobility, functional amb with RW in roon, standing balance, BADL retraining including bathing/dressing with sit<>stand from w/c at sink.  Pt requires CGA when standing at sink for LB bathing/dressing tasks.  Pt continues to require multiple rest breaks during session. Pt states the numbness in B hands is better and increased function noted. All functional transfers with CGA/supervisoin. Pt remained in recliner with seat alarm activated and all needs within reach.   Therapy Documentation Precautions:  Precautions Precautions: Fall, Cervical Precaution Booklet Issued: Yes (comment) Precaution Comments: reviewed log roll technique and all cervical precautions Required Braces or Orthoses: (no brace needed per orders) Restrictions Weight Bearing Restrictions: No Pain: Pt states her shoulders and neck feel better today; pt rates pain at 3/10-repositioned  Therapy/Group: Individual Therapy  Leroy Libman 01/30/2019, 9:58 AM

## 2019-01-30 NOTE — Progress Notes (Signed)
Pt slept well during night without issue, up to use bathroom every few hours with assistance.

## 2019-01-30 NOTE — Progress Notes (Signed)
Physical Therapy Weekly Progress Note  Patient Details  Name: Faith [name] MRN: [id number] Date of Birth: 1955-12-01  Beginning of progress report period: January 23, 2019 End of progress report period: January 30, 2019  Today's Date: 01/30/2019 PT Individual Time: 2563-8937 and 3428-7681 PT Individual Time Calculation (min): 32 min and 30 min  Patient has met 3 of 3 short term goals.  She is progressing well towards long term goals, some sessions have been limited by patient's increased fatigue. She currently requires supervision for bed mobility, basic transfers with a RW, and gait with a RW averaging around 30-40 feet and up to 88 feet when not fatigued. She currently requires min A for 3-6" steps with 1 rail and her family is reaching out to determine if they can get a ramp build or a temporary ramp in place prior to d/c for safety and energy conservation.   Patient continues to demonstrate the following deficits muscle weakness, decreased cardiorespiratoy endurance, abnormal tone, unbalanced muscle activation and decreased coordination and decreased sitting balance, decreased standing balance, decreased postural control and decreased balance strategies and therefore will continue to benefit from skilled PT intervention to increase functional independence with mobility.  Patient progressing toward long term goals..  Continue plan of care.  PT Short Term Goals Week 1:  PT Short Term Goal 1 (Week 1): Patient will perform bed mobility with supervision. PT Short Term Goal 1 - Progress (Week 1): Met PT Short Term Goal 2 (Week 1): Patient will perform basic transfers with CGA using LRAD. PT Short Term Goal 2 - Progress (Week 1): Met PT Short Term Goal 3 (Week 1): Patient will perform dynamic standing balance activities with CGA. PT Short Term Goal 3 - Progress (Week 1): Met PT Short Term Goal 4 (Week 1): Patient will ambulate >30 feet using LRAD with CGA. PT Short Term Goal 4 - Progress (Week  1): Met Week 2:  PT Short Term Goal 1 (Week 2): STG=LTG due to ELOS.  Skilled Therapeutic Interventions/Progress Updates:     Session 1: Patient in recliner in room upon PT arrival. Patient alert and agreeable to PT session. Patient denied pain and reported less fatigue this morning.   Therapeutic Activity: Transfers: Patient performed sit to/from stand x2 and stand pivot x2 using a RW with close supervision for safety. Provided verbal cues for reaching back to sit.  Gait Training:  Patient ambulated 88 feet using RW with close supervision and w/c follow due to decreased activity tolerance. Ambulated with B recurvatum, decreased step length and height, variable foot placement, scissoring gait with fatigue, and downward head gaze. Provided verbal cues for looking ahead and increased BOS. To simulate home entry of 3 steps with an unsafe rail patient attempted ascending steps using the RW following PT demonstration. She performed 1-6" step using the RW ascending backwards, she was unable to safely maintain balance and bring the RW up to the next step to continue up the stairs and returned down the 1 step forwards with min A.   Wheelchair Mobility:  Patient was transported in the w/c with total A throughout session for energy conservation and time management.  Patient in recliner in room at end of session with breaks locked, chair alarm set, and all needs within reach.   Session 2: Patient in bed upon PT arrival. Patient alert and agreeable to PT session. Patient reported 5/10 neck pain during session, RN made aware. PT provided repositioning, rest breaks, and distraction as pain interventions throughout  session. Discussed ELOS, POC, and wheelchair evaluation throughout session. Patient is concerned about the w/c evaluation, stating that she does not want to give up on walking. PT educated about use of w/c for mobility only at times of increased fatigue and encouraged the patient to continue walking  as much as possible when it was safe. Stated that a w/c may prevent falls at home when she is fatigued. Patient stated understanding and was agreeable to participating in a w/c evaluation.   Therapeutic Activity: Bed Mobility: Patient performed supine to/from sit with supervision for safety in a flat bed with min use of a bed rail.  Transfers: Patient performed sit to/from stand x2 and a toilet transfer x1 with close supervision. Provided verbal cues for hand placement on RW. Required supervision for peri-care and LB dressing during toileting.   Gait Training:  Patient ambulated 20 feet x2 using RW with close supervision. Ambulated as above.   Neuromuscular Re-ed: Patient performed standing balance with single UE support and reaching alternating hands and directions x1 min. She then performed side stepping to the R x10 feet with increased time due to fatigue and decreased R LE proprioception.   Patient in bed at end of session with breaks locked, bed alarm set, and all needs within reach.    Therapy Documentation Precautions:  Precautions Precautions: Fall, Cervical Precaution Booklet Issued: Yes (comment) Precaution Comments: reviewed log roll technique and all cervical precautions Required Braces or Orthoses: (no brace needed per orders) Restrictions Weight Bearing Restrictions: No   Therapy/Group: Individual Therapy  Jeania Nater L Cami Delawder PT, DPT  01/30/2019, 12:53 PM

## 2019-01-30 NOTE — Patient Care Conference (Signed)
Inpatient RehabilitationTeam Conference and Plan of Care Update Date: 01/30/2019   Time: 11:30 AM    Patient Name: Faith [name]      Medical Record Number: [id number]  Date of Birth: [birth year] Sex: Female         Room/Bed: 4M05C/4M05C-01 Payor Info: Payor: MEDICAID [place] / Plan: MEDICAID [place] ACCESS / Product Type: *No Product type* /    Admit Date/Time:  01/22/2019  2:42 PM  Primary Diagnosis:  Cervical myelopathy ([place])  Patient Active Problem List   Diagnosis Date Noted  . Cervical myelopathy ([place]) 01/22/2019  . Spinal cord compression due to degenerative disorder of spinal column 01/20/2019  . Synovial cyst   . Spondylolisthesis, cervical region 01/19/2019  . Chronic back pain 01/18/2019  . Multiple sclerosis exacerbation ([place]) 01/17/2019  . Erythrocytosis 09/13/2018  . Vitamin D deficiency 03/09/2016  . Multiple sclerosis ([place]) 02/24/2016  . Tobacco abuse 12/11/2015  . Aortic atherosclerosis ([place]) 12/11/2015  . 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 Falls frequently 12/11/2015  . 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 Other emphysema ([place]) 12/11/2015    Expected Discharge Date: Expected Discharge Date: 02/06/19  Team Members Present: Physician leading conference: Dr. Courtney Heys Social Worker Present: Ovidio Kin, LCSW Nurse Present: Suella [place], RN Case Manager: Karene Fry, RN PT Present: Apolinar Junes, PT OT Present: Roanna Epley, [place], OT SLP Present: Weston Anna, SLP PPS Coordinator present : Gunnar Fusi, SLP     Current Status/Progress Goal Weekly Team Focus  Bowel/Bladder   pt continent of bowel and bladder  remain continent  as q shift and prn   Swallow/Nutrition/ Hydration             ADL's   bathing at shower level-CGA; LB dressing-CGA; toileting-CGA; functional transfers-CGA/supervisoin; fatigues quickly  Supervision  ADL retraining, educaiton, dynamic standing balance, safety  awareness   Mobility   Supervision-CGA bed mobility, transfers, gait up to 39 feet with RW, min A-supervision w/c mobility 45 feet, min A 3-6" steps with B rails.  Mod I bed mobility and transfers, supervision gait 50 feet, min A 4 stiars  UE and LE NMR, functional mobility, activity tolerance, w/c evaluation and mobility, d/c planning, gait training, balance, patient/caregiver education   Communication             Safety/Cognition/ Behavioral Observations            Pain   c/o minor neck pain and stiffness prns are available  pain less than 5  assess pain q shift and prn   Skin   ant and post neck incison with skin glue, dry and intact  prevent further skin breakdown  assess skin q shift and prn    Rehab Goals Patient on target to meet rehab goals: Yes *See Care Plan and progress notes for long and short-term goals.     Barriers to Discharge  Current Status/Progress Possible Resolutions Date Resolved   Nursing                  PT  Home environment access/layout  4 STE home with unsafe railing  Patient's son-in-law is pricing wood for building a ramp           OT                  SLP                SW  Discharge Planning/Teaching Needs:  Home with son who can provide 24/7 assistance.  Teaching needs TBD   Team Discussion: h/o MS, cervical myelopathy, baclofen started last week.  RN - cont B/B, ant/post neck incision, minor neck pain.  OT S goals, shower CGA/S, transfers S/CGA, fatigues quickly, making progress.  PT mod I goals, S gait 50', today amb 88', has 3 steps front and 4 steps at back, ?ramp needed, son pricing wood, neighbor may loan a ramp.  Fam ed with Dtr on Friday.   Revisions to Treatment Plan: N/A     Medical Summary Current Status: goals supervision- shower CGA; CGA-min assist when gets fatigued- making progress; K tape across shoulders which helps pain. Weekly Focus/Goal: goals mod I for PT/supervision- walked 27ft RW today (was 30 ft prior);  biggest barrier stairs to enter home; rail unreliable at home;  Barriers to Discharge: Decreased family/caregiver support;Neurogenic Bowel & Bladder;Weight;Wound care;Other (comments);Home enviroment access/layout  Barriers to Discharge Comments: cervical myelopathy; large bruise L shoulder that's painful Possible Resolutions to Barriers: on target for d/c next Tuesday.   Continued Need for Acute Rehabilitation Level of Care: The patient requires daily medical management by a physician with specialized training in physical medicine and rehabilitation for the following reasons: Direction of a multidisciplinary physical rehabilitation program to maximize functional independence : Yes Medical management of patient stability for increased activity during participation in an intensive rehabilitation regime.: Yes Analysis of laboratory values and/or radiology reports with any subsequent need for medication adjustment and/or medical intervention. : Yes   I attest that I was present, lead the team conference, and concur with the assessment and plan of the team.   Retta Diones 01/30/2019, 4:45 PM  Team conference was held via web/ teleconference due to [place] - 19

## 2019-01-30 NOTE — Progress Notes (Signed)
Occupational Therapy Session Note  Patient Details  Name: NELSON WESTERGAARD MRN: [id number] Date of Birth: [birth year]  Today's Date: 01/30/2019 OT Individual Time: 1300-1355 OT Individual Time Calculation (min): 55 min    Short Term Goals: Week 2:  OT Short Term Goal 1 (Week 2): STG=LTG secondary to ELOS  Skilled Therapeutic Interventions/Progress Updates:     Pt resting in bed upon arrival and agreeable to therapy.  Pt's son completed measurement sheet with diagram.  Pt will be able to access both bathrooms with RW.  Practiced simulated walk-in shower transfer with 4" rise to enter shower.  Pt completed with CGA. Pt also engaged in [place] gross motor and [facility] activities seated in w/c.  Pt has difficulty picking up small pegs with R hand but is able to pick them up with L hand.  [place] wrapped around pegs and pt able to pick up with R hand.  Pt presented with variety of medicine bottles and is able to remove cap from all bottles.  Pt returned to room and amb to bed.  Pt remained in bed with all needs within reach and bed alarm activated.   Therapy Documentation Precautions:  Precautions Precautions: Fall, Cervical Precaution Booklet Issued: Yes (comment) Precaution Comments: reviewed log roll technique and all cervical precautions Required Braces or Orthoses: (no brace needed per orders) Restrictions Weight Bearing Restrictions: No Pain:  Pt c/o R hand feels more numb then in the morning; activity and emotional support  Therapy/Group: Individual Therapy  Leroy Libman 01/30/2019, 2:40 PM

## 2019-01-31 ENCOUNTER — Inpatient Hospital Stay (HOSPITAL_COMMUNITY): Payer: Medicaid Other

## 2019-01-31 MED ORDER — MUSCLE RUB 10-15 % EX CREA
TOPICAL_CREAM | Freq: Three times a day (TID) | CUTANEOUS | Status: DC
  Administered 2019-01-31 – 2019-02-01 (×3): via TOPICAL
  Administered 2019-02-02: 1 via TOPICAL
  Administered 2019-02-02 – 2019-02-05 (×15): via TOPICAL
  Administered 2019-02-06: 1 via TOPICAL
  Filled 2019-01-31: qty 85

## 2019-01-31 NOTE — Plan of Care (Signed)
  Problem: Activity: Goal: Ability to avoid complications of mobility impairment will improve Outcome: Progressing Goal: Ability to tolerate increased activity will improve Outcome: Progressing Goal: Will remain free from falls Outcome: Progressing   Problem: Clinical Measurements: Goal: Ability to maintain clinical measurements within normal limits will improve Outcome: Progressing Goal: Postoperative complications will be avoided or minimized Outcome: Progressing   Problem: Pain Management: Goal: Pain level will decrease Outcome: Progressing   Problem: Skin Integrity: Goal: Will show signs of wound healing Outcome: Progressing   Problem: Consults Goal: RH GENERAL PATIENT EDUCATION Description: See Patient Education module for education specifics. Outcome: Progressing   Problem: Consults Goal: RH SPINAL CORD INJURY PATIENT EDUCATION Description:  See Patient Education module for education specifics.  Outcome: Progressing Goal: Skin Care Protocol Initiated - if Braden Score 18 or less Description: If consults are not indicated, leave blank or document N/A Outcome: Progressing Goal: Nutrition Consult-if indicated Outcome: Progressing Goal: Diabetes Guidelines if Diabetic/Glucose > 140 Description: If diabetic or lab glucose is > 140 mg/dl - Initiate Diabetes/Hyperglycemia Guidelines & Document Interventions  Outcome: Progressing   Problem: SCI BOWEL ELIMINATION Goal: RH STG MANAGE BOWEL WITH ASSISTANCE Description: STG Manage Bowel with mod Assistance. Outcome: Progressing   Problem: SCI BLADDER ELIMINATION Goal: RH STG MANAGE BLADDER WITH ASSISTANCE Description: STG Manage Bladder With mod Assistance Outcome: Progressing   Problem: RH SKIN INTEGRITY Goal: RH STG SKIN FREE OF INFECTION/BREAKDOWN Description: Skin free from skin infection entire stay on rehab Outcome: Progressing   Problem: RH SAFETY Goal: RH STG ADHERE TO SAFETY PRECAUTIONS  W/ASSISTANCE/DEVICE Description: STG Adhere to Safety Precautions With  mod Assistance/Device. Outcome: Progressing   Problem: RH PAIN MANAGEMENT Goal: RH STG PAIN MANAGED AT OR BELOW PT'S PAIN GOAL Description: Pain below2 Outcome: Progressing

## 2019-01-31 NOTE — Progress Notes (Signed)
[facility] PHYSICAL MEDICINE & REHABILITATION PROGRESS NOTE   Subjective/Complaints:  Pt reports hands are working again better again this AM.  Since 3am.  ROS- denies SOB, CP, N/V/D.    Objective:   No results found. Recent Labs    01/29/19 0549  WBC 8.4  HGB 14.6  HCT 42.4  PLT 325   Recent Labs    01/29/19 0549  NA 139  K 4.0  CL 100  CO2 31  GLUCOSE 89  BUN 15  CREATININE 0.75  CALCIUM 9.4    Intake/Output Summary (Last 24 hours) at 01/31/2019 0757 Last data filed at 01/31/2019 0300 Gross per 24 hour  Intake 720 ml  Output -  Net 720 ml     Physical Exam: Vital Signs Blood pressure 114/79, pulse 67, temperature 97.8 F (36.6 C), temperature source Oral, resp. rate 18, height 5\' 6"  (1.676 m), weight 43.5 kg, SpO2 98 %. Vitals and labs reviewed- Gen- awake, alert, appropriate,sitting up in manual w/c; at sink, getting hair untangled, NAD HEENT- conjugate gaze; no facial assymmetry CV- RRR Pulm- CTA B/L GI- soft, NT, ND;  (+)BS MS-  LUE- deltoid/bicep/tricep 4/5; WE/grip/finger abd 4-/5 RUE- Delt/bicep/tricep 4-/5 and WE/grip and finger abd 3+/5  - hands can make fists much better than last week  RLE HF 3/5, KE 4-/5, DF/PF 4/5  LLE- HF 3/5, KE 4-/5, DF/PF 4/5 Skin- no skin breakdown seen on exam currently- has anterior incision glued on anterior cervical neck- looks good- no drainage/no erythema and posterior incision as well- both glued - also has angry purple/yellow bruise on L shoulder/neck area that appears uncomfortable- she doesn't know where it came from    Assessment/Plan: 1. Functional deficits secondary to cervical myelopathy which require 3+ hours per day of interdisciplinary therapy in a comprehensive inpatient rehab setting.  Physiatrist is providing close team supervision and 24 hour management of active medical problems listed below.  Physiatrist and rehab team continue to assess barriers to discharge/monitor patient progress toward  functional and medical goals  Care Tool:  Bathing    Body parts bathed by patient: Right arm, Left arm, Chest, Abdomen, Front perineal area, Buttocks, Right upper leg, Left upper leg, Face, Right lower leg, Left lower leg   Body parts bathed by helper: Right lower leg, Left lower leg     Bathing assist Assist Level: Contact Guard/Touching assist     Upper Body Dressing/Undressing Upper body dressing   What is the patient wearing?: Pull over shirt    Upper body assist Assist Level: Set up assist    Lower Body Dressing/Undressing Lower body dressing      What is the patient wearing?: Underwear/pull up, Pants     Lower body assist Assist for lower body dressing: Contact Guard/Touching assist     Toileting Toileting    Toileting assist Assist for toileting: Contact Guard/Touching assist     Transfers Chair/bed transfer  Transfers assist     Chair/bed transfer assist level: Supervision/Verbal cueing Chair/bed transfer assistive device: Programmer, multimedia   Ambulation assist      Assist level: Supervision/Verbal cueing Assistive device: Walker-rolling Max distance: 88'   Walk 10 feet activity   Assist     Assist level: Supervision/Verbal cueing Assistive device: Walker-rolling   Walk 50 feet activity   Assist Walk 50 feet with 2 turns activity did not occur: Safety/medical concerns(decreased strength/activity tolerance)  Assist level: Supervision/Verbal cueing Assistive device: Walker-rolling    Walk 150 feet activity  Assist Walk 150 feet activity did not occur: Safety/medical concerns(decreased strength/activity tolerance)         Walk 10 feet on uneven surface  activity   Assist Walk 10 feet on uneven surfaces activity did not occur: Safety/medical concerns(decreased strength/activity tolerance)         Wheelchair     Assist   Type of Wheelchair: Manual    Wheelchair assist level: Minimal Assistance -  Patient > 75% Max wheelchair distance: 46'    Wheelchair 50 feet with 2 turns activity    Assist        Assist Level: Supervision/Verbal cueing   Wheelchair 150 feet activity     Assist  Wheelchair 150 feet activity did not occur: Safety/medical concerns(decreased strength/activity tolerance)       Blood pressure 114/79, pulse 67, temperature 97.8 F (36.6 C), temperature source Oral, resp. rate 18, height 5\' 6"  (1.676 m), weight 43.5 kg, SpO2 98 %.  Medical Problem List and Plan: 1.  Decrease in strength, mobility and coordination secondary to cervical synovial cyst, spinal cord compression, cervical spondylolisthesis with radiculopathy C3-4.S/P posterior cervical laminectomy for synovial cyst resection, posterior cervical arthrodesis with anterior instrumentation 01/19/2019.  No cervical brace required             -Will discuss with surgery team to determine when patient may shower.  11/27- will let pt shower with covering incisions- told OT.             -ELOS/Goals: Mod I PT and OT, I SLP 2.  Antithrombotics: -DVT/anticoagulation: SCDs.  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             -antiplatelet therapy: N/A 3. Pain Management: Celebrex 200 mg every 12 hours, Neurontin 600 mg 3 times daily, oxycodone/tramadol and Valium as needed  11/27- pain usually controlled except over L shoulder bruise- which is significant.   11/30: Ordered heating pad for bilateral shoulder pain--avoid application over bruise. 4. Mood: Provide emotional support             -antipsychotic agents: N/A 5. Neuropsych: This patient is capable of making decisions on her own behalf. 6. Skin/Wound Care: Routine skin checks 7. Fluids/Electrolytes/Nutrition: Routine in and outs with follow-up chemistries.  Labs stable on 11/30. 8.  Multiple sclerosis.  Diagnosed 3 years ago.  Neurology services.  Continue Inderal 60 mg twice daily. Recommend starting Baclofen 5mg  TID for spasticity.   11/24- will start Baclofen 5 mg TID for tone/spasms.  9.  Tobacco abuse.  NicoDerm patch 10.  Neurogenic bowel/ bladder.  Modified bowel program.  Check PVR  11/24- will change caths/bladder scans to q6 hours and cath if volumes >200cc; will d/w pt if needs bowel program due to MS/new incomplete cervical myelopathy.  11/25- needs to have BM- if doesn't today, please give suppository. Isn't getting cathed based on flowsheet documentation.   11/26- Had 3 BMs yesterday- no more constipation  11/29- says is more regular  11/30: with regular BM; had one this morning.  12/2- regular BMs      LOS: 9 days A FACE TO FACE EVALUATION WAS PERFORMED  George Haggart 01/31/2019, 7:57 AM

## 2019-01-31 NOTE — Progress Notes (Signed)
Pt slept well overnight, doing well ambulating to BR with minimal assist with roll walker. Gets unsteady if having urgency once arriving to commode.

## 2019-01-31 NOTE — Progress Notes (Signed)
Physical Therapy Session Note  Patient Details  Name: Faith [name] MRN: [id number] Date of Birth: 1955/04/15  Today's Date: 01/31/2019 PT Individual Time: 1345-1500 PT Individual Time Calculation (min): 75 min   Short Term Goals: Week 2:  PT Short Term Goal 1 (Week 2): STG=LTG due to ELOS.  Skilled Therapeutic Interventions/Progress Updates:     Session 1: Patient in bed upon PT arrival. Patient alert and agreeable to PT session. Patient reported 2-3/10 neck pain during session. PT provided repositioning, rest breaks, and distraction as pain interventions throughout session. Discussed w/c evaluation and purpose of power chair with patient. Patient expressed concerns about not walking at home. PT encouraged the patient to walk at home and explained that the power chair will be to provided her a way to conserve energy when she is fatigued, but continue to be independent and have access to what she needs while preventing falls due to fatigue. Also, discussed attending family functions or outings that she has mentioned missing due to fatigue. Patient stated understanding and was feeling much better about the prospect of having a power chair after discussion.   Therapeutic Activity: Bed Mobility: Patient performed supine to/from sit with supervision using one rail. Transfers: Patient performed sit to/from stand x4 and stand pivot x2 with supervision. Provided verbal cues for hand placement on RW and reaching back to sit.  Gait Training:  Patient ambulated 50 feet x2 using close supervision for safety with a RW. Ambulated with narrow BOS with intermittent scissoring with fatigue and B extensor thrust. Provided verbal cues for looking ahead. Educated on use of modified RPE and sitting at 5-6/10 for energy conservation.  Wheelchair Mobility:  Patient was transported in the w/c with total A throughout session for energy conservation and time management.  Neuromuscular Re-ed: Patient performed  standing balance without UE support x5 trials: 5 sec, 15 sec, 12 sec, 22 sec, and 11 sec with posterior LOB each trial. She then performed standing balance on red foam wedge x5 trials without UE support: 7 sec, 9 sec, 10 sec, and 12 sec x2. Instructed patient to press toes into the foam wedge to offset posterior LOB for improve ankle strategies.  Patient in bed at end of session with breaks locked, bed alarm set, and all needs within reach.   Session 2: Patient in bed upon PT arrival. Patient alert and agreeable to PT session. Patient reported 5/10 neck pain during session, patient reported that she was pre-medicated prior to session. PT provided repositioning, rest breaks, and distraction as pain interventions throughout session. Provided soft tissue mobilization and trigger point release to suboccipital and upper trap muscles due to increased muscle tension and trigger points on palpation, RN made aware.   Therapeutic Activity: Bed Mobility: Patient performed supine to sit with supervision using a bed rail in a flat bed. Provided verbal cues for reduced pulling with UEs to reduce neck strain. Transfers: Patient performed sit to/from stand x4 and stand pivot x1 using a RW with supervision for safety. Provided verbal cues for hand placement on RW and reaching back to sit.  Gait Training:  Patient ambulated 8 feet using B HHA with min A for balance/support. Ambulated with increased step length with PT providing facilitation for forward propulsion.   Wheelchair Mobility:  Patient was transported in the w/c with total A throughout session for energy conservation and time management.  Neuromuscular Re-ed: Patient performed [place] Limits of Stability training for LE proprioception and use of ankle strategies with balance at  level 11: first trial 12% and 2:46; second trial 30% and 1:41.  Patient in recliner with her daughter in the room at end of session with breaks locked, chair alarm set, and all  needs within reach. Discussed ramp installation with daughter, plan to have temporary ramp installed by Sunday. Educated on power chair evaluation and purpose for energy conservation and community mobility. Patient and her daughter receptive to education.     Therapy Documentation Precautions:  Precautions Precautions: Fall, Cervical Precaution Booklet Issued: Yes (comment) Precaution Comments: reviewed log roll technique and all cervical precautions Required Braces or Orthoses: (no brace needed per orders) Restrictions Weight Bearing Restrictions: No    Therapy/Group: Individual Therapy  Chiante Peden L Wynetta Seith PT, DPT  01/31/2019, 4:17 PM

## 2019-01-31 NOTE — Progress Notes (Signed)
Occupational Therapy Session Note  Patient Details  Name: COLLINS DIMARIA MRN: [id number] Date of Birth: [birth year]  Today's Date: 01/31/2019 OT Individual Time: 0700-0757 OT Individual Time Calculation (min): 57 min    Short Term Goals: Week 1:  OT Short Term Goal 1 (Week 1): Pt will complete LB dressing with min assist with AE as needed OT Short Term Goal 1 - Progress (Week 1): Met OT Short Term Goal 2 (Week 1): Pt will complete toilet transfers min assist with RW 3/5 attempts OT Short Term Goal 2 - Progress (Week 1): Met OT Short Term Goal 3 (Week 1): Pt will complete 1 grooming task in standing to demonstrate increased standing tolerance OT Short Term Goal 3 - Progress (Week 1): Met  Skilled Therapeutic Interventions/Progress Updates:    1;1. Pt received in bed with no pain. Pt reporting needing to toilet. Pt ambulates with CGA to all surfaces with RW for simulated household mobility with min VC for RW management during turns. Pt elects to wash up at sink. Pt bathes at sit to stand level from w/c at sink with S overall for sitting and standing balance. Dressing completed with set up for UB and S for LB with pt using sink to steady self in standing. Pt completes oral care seated at sink with set up. Pt with matted hair and provided with detangle spray and brush. OT provides MAX A to detangle mat of hair at back of head and pt able to brush hair after with S. Exited session with tp seated in bed exit alarm on and call light in reach  Therapy Documentation Precautions:  Precautions Precautions: Fall, Cervical Precaution Booklet Issued: Yes (comment) Precaution Comments: reviewed log roll technique and all cervical precautions Required Braces or Orthoses: (no brace needed per orders) Restrictions Weight Bearing Restrictions: No General:   Vital Signs: Therapy Vitals Temp: 97.8 F (36.6 C) Temp Source: Oral Pulse Rate: 67 BP: 114/79 Oxygen Therapy SpO2: 98 % Pain:    ADL: ADL Equipment Provided: Long-handled sponge Grooming: Minimal assistance Where Assessed-Grooming: Sitting at sink Upper Body Bathing: Supervision/safety Where Assessed-Upper Body Bathing: Shower Lower Body Bathing: Contact guard Where Assessed-Lower Body Bathing: Shower Upper Body Dressing: Setup Where Assessed-Upper Body Dressing: Chair Lower Body Dressing: Contact guard Where Assessed-Lower Body Dressing: Chair Toileting: Contact guard Where Assessed-Toileting: Bedside Commode, Control and instrumentation engineer Transfer: Therapist, music Method: Arts development officer: Radiographer, therapeutic: Curator: Curator Method: Stand pivot Youth worker: Radio broadcast assistant, Systems analyst    Praxis   Exercises:   Other Treatments:     Therapy/Group: Individual Therapy  Tonny Branch 01/31/2019, 7:11 AM

## 2019-01-31 NOTE — Progress Notes (Signed)
Social Work Patient ID: Faith [name], female   DOB: 12-22-1955, 63 y.o.   MRN: [id number]   Have reviewed team conference with pt and daughter.  Both aware and agreeable with targeted d/c date of 12/8 and supervision goals overall.  Have scheduled for son and daughter to be here Friday at [place] for family education.  Continue to follow.  Holbert Caples, LCSW

## 2019-02-01 ENCOUNTER — Inpatient Hospital Stay (HOSPITAL_COMMUNITY): Payer: Medicaid Other

## 2019-02-01 NOTE — Progress Notes (Signed)
Pharmacy out of stock of baclofen on on 01/31/19 until the afternoon. Patient missed 2 doses of baclofen on 01/31/19. Provider aware.

## 2019-02-01 NOTE — Plan of Care (Signed)
  Problem: Consults Goal: RH GENERAL PATIENT EDUCATION Description: See Patient Education module for education specifics. Outcome: Progressing   Problem: Consults Goal: RH SPINAL CORD INJURY PATIENT EDUCATION Description:  See Patient Education module for education specifics.  Outcome: Progressing Goal: Skin Care Protocol Initiated - if Braden Score 18 or less Description: If consults are not indicated, leave blank or document N/A Outcome: Progressing Goal: Nutrition Consult-if indicated Outcome: Progressing Goal: Diabetes Guidelines if Diabetic/Glucose > 140 Description: If diabetic or lab glucose is > 140 mg/dl - Initiate Diabetes/Hyperglycemia Guidelines & Document Interventions  Outcome: Progressing   Problem: SCI BOWEL ELIMINATION Goal: RH STG MANAGE BOWEL WITH ASSISTANCE Description: STG Manage Bowel with mod Assistance. Outcome: Progressing   Problem: SCI BLADDER ELIMINATION Goal: RH STG MANAGE BLADDER WITH ASSISTANCE Description: STG Manage Bladder With mod Assistance Outcome: Progressing   Problem: RH SKIN INTEGRITY Goal: RH STG SKIN FREE OF INFECTION/BREAKDOWN Description: Skin free from skin infection entire stay on rehab Outcome: Progressing   Problem: RH SAFETY Goal: RH STG ADHERE TO SAFETY PRECAUTIONS W/ASSISTANCE/DEVICE Description: STG Adhere to Safety Precautions With  mod Assistance/Device. Outcome: Progressing   Problem: RH PAIN MANAGEMENT Goal: RH STG PAIN MANAGED AT OR BELOW PT'S PAIN GOAL Description: Pain below2 Outcome: Progressing

## 2019-02-01 NOTE — Progress Notes (Signed)
Occupational Therapy Session Note  Patient Details  Name: Faith [name] MRN: [id number] Date of Birth: 1955-07-02  Today's Date: 02/01/2019 OT Individual Time: 1300-1400 OT Individual Time Calculation (min): 60 min    Short Term Goals: Week 1:  OT Short Term Goal 1 (Week 1): Pt will complete LB dressing with min assist with AE as needed OT Short Term Goal 1 - Progress (Week 1): Met OT Short Term Goal 2 (Week 1): Pt will complete toilet transfers min assist with RW 3/5 attempts OT Short Term Goal 2 - Progress (Week 1): Met OT Short Term Goal 3 (Week 1): Pt will complete 1 grooming task in standing to demonstrate increased standing tolerance OT Short Term Goal 3 - Progress (Week 1): Met  Skilled Therapeutic Interventions/Progress Updates:    1;1. Pt received in bed eating lunch. Pt agreeable to tx with no pain reported but "feet not working like they should.'' Pt completes bed mobility, functional ambulation with RW, toileting and grooming standing at sink with S overall. Pt requires VC for RW management to keep RW with her while turning and backing up to surfaces and to weight shift forward in standing. Pt completes UE coordination task manipulating ping pong balls in egg carton to match picture for [facility]. Wrist weight applied to decrease tremors with improvement. Box and blocks assessment completed as fllows:  LUE 22 then 25 with 1# wrist weight applied RUE: 16 then 19 with wrist weight  Discussed use of weighted utensils at restaruants for improved efficacy with self feeding. Exited session with pt seated in bed, exi talarm on and call light in reach  Therapy Documentation Precautions:  Precautions Precautions: Fall, Cervical Precaution Booklet Issued: Yes (comment) Precaution Comments: reviewed log roll technique and all cervical precautions Required Braces or Orthoses: (no brace needed per orders) Restrictions Weight Bearing Restrictions: No General:   Vital Signs:   Pain:    ADL: ADL Equipment Provided: Long-handled sponge Grooming: Minimal assistance Where Assessed-Grooming: Sitting at sink Upper Body Bathing: Supervision/safety Where Assessed-Upper Body Bathing: Shower Lower Body Bathing: Contact guard Where Assessed-Lower Body Bathing: Shower Upper Body Dressing: Setup Where Assessed-Upper Body Dressing: Chair Lower Body Dressing: Contact guard Where Assessed-Lower Body Dressing: Chair Toileting: Contact guard Where Assessed-Toileting: Bedside Commode, Control and instrumentation engineer Transfer: Therapist, music Method: Arts development officer: Radiographer, therapeutic: Curator: Curator Method: Stand pivot Youth worker: Radio broadcast assistant, Systems analyst    Praxis   Exercises:   Other Treatments:     Therapy/Group: Individual Therapy  Tonny Branch 02/01/2019, 1:09 PM

## 2019-02-01 NOTE — Progress Notes (Signed)
Occupational Therapy Session Note  Patient Details  Name: Faith [name] MRN: [id number] Date of Birth: 02/11/1956  Today's Date: 02/01/2019 OT Individual Time: 0700-0810 OT Individual Time Calculation (min): 70 min    Short Term Goals: Week 2:  OT Short Term Goal 1 (Week 2): STG=LTG secondary to ELOS  Skilled Therapeutic Interventions/Progress Updates:    OT intervention with focus on sit<>stand, standing balance, functional amb with RW, BADL retraining including bathing at shower level and dressing with sit<>stand from seat, activity tolerance, safety awareness, and energy conservation strategies to increase independence with BADLs. PT amb with RW with CGA in room to access bathroom for shower. Pt completed shower with sit<>stand using grab bars. Pt completed all dressing tasks with sit<>stand with CGA for standing to pull pants over hips. Pt requires assistance donning Ted hose. Pt stated that she was exhausted after bathing/dressing tasks this morning.  Pt remained in recliner with seat alarm activated and all needs within reach.   Therapy Documentation Precautions:  Precautions Precautions: Fall, Cervical Precaution Booklet Issued: Yes (comment) Precaution Comments: reviewed log roll technique and all cervical precautions Required Braces or Orthoses: (no brace needed per orders) Restrictions Weight Bearing Restrictions: No Pain: Pt stated pain in neck and shoulders was "ok" at beginning of session but c/o increased discomfort after shower and moving around for bathing/dressing tasks; repositioned and RN notified  Therapy/Group: Individual Therapy  Leroy Libman 02/01/2019, 8:10 AM

## 2019-02-01 NOTE — Progress Notes (Addendum)
[facility] PHYSICAL MEDICINE & REHABILITATION PROGRESS NOTE   Subjective/Complaints:  Pt reports baclofen helps muscle tightness-   Per PT, therapy had a question for me- Wondering if has to do with muscle tightness- pt thinks on "good dose".  Also, knee "popped" on the stairs.  ROS- denies SOB, CP, N/V/D.    Objective:   No results found. No results for input(s): WBC, HGB, HCT, PLT in the last 72 hours. No results for input(s): NA, K, CL, CO2, GLUCOSE, BUN, CREATININE, CALCIUM in the last 72 hours. No intake or output data in the 24 hours ending 02/01/19 0924   Physical Exam: Vital Signs Blood pressure 107/80, pulse 61, temperature 97.9 F (36.6 C), temperature source Oral, resp. rate 16, height 5\' 6"  (1.676 m), weight 43.5 kg, SpO2 95 %. Vitals and labs reviewed- Gen- awake, alert, appropriate,sitting up in manual w/c, watching TV, NAD HEENT- conjugate gaze; no facial assymmetry CV- RRR Pulm- CTA B/L GI- soft, NT, ND;  (+)BS MS-  LUE- deltoid/bicep/tricep 4/5; WE/grip/finger abd 4-/5 RUE- Delt/bicep/tricep 4-/5 and WE/grip and finger abd 3+/5  - hands can make fists much better than last week  RLE HF 3/5, KE 4-/5, DF/PF 4/5  LLE- HF 3/5, KE 4-/5, DF/PF 4/5 Neuro- MAS of 1- 1+ in LEs spasticity-  Skin- no skin breakdown seen on exam currently- has anterior incision glued on anterior cervical neck- looks good- no drainage/no erythema and posterior incision as well- both glued - also has angry purple/yellow bruise on L shoulder/neck area that appears uncomfortable- she doesn't know where it came from    Assessment/Plan: 1. Functional deficits secondary to cervical myelopathy which require 3+ hours per day of interdisciplinary therapy in a comprehensive inpatient rehab setting.  Physiatrist is providing close team supervision and 24 hour management of active medical problems listed below.  Physiatrist and rehab team continue to assess barriers to discharge/monitor patient  progress toward functional and medical goals  Care Tool:  Bathing    Body parts bathed by patient: Right arm, Left arm, Chest, Abdomen, Front perineal area, Buttocks, Right upper leg, Left upper leg, Face, Right lower leg, Left lower leg   Body parts bathed by helper: Right lower leg, Left lower leg     Bathing assist Assist Level: Supervision/Verbal cueing     Upper Body Dressing/Undressing Upper body dressing   What is the patient wearing?: Pull over shirt    Upper body assist Assist Level: Set up assist    Lower Body Dressing/Undressing Lower body dressing      What is the patient wearing?: Underwear/pull up, Pants     Lower body assist Assist for lower body dressing: Supervision/Verbal cueing     Toileting Toileting    Toileting assist Assist for toileting: Contact Guard/Touching assist     Transfers Chair/bed transfer  Transfers assist     Chair/bed transfer assist level: Supervision/Verbal cueing Chair/bed transfer assistive device: Programmer, multimedia   Ambulation assist      Assist level: Supervision/Verbal cueing Assistive device: Walker-rolling Max distance: 88'   Walk 10 feet activity   Assist     Assist level: Supervision/Verbal cueing Assistive device: Walker-rolling   Walk 50 feet activity   Assist Walk 50 feet with 2 turns activity did not occur: Safety/medical concerns(decreased strength/activity tolerance)  Assist level: Supervision/Verbal cueing Assistive device: Walker-rolling    Walk 150 feet activity   Assist Walk 150 feet activity did not occur: Safety/medical concerns(decreased strength/activity tolerance)  Walk 10 feet on uneven surface  activity   Assist Walk 10 feet on uneven surfaces activity did not occur: Safety/medical concerns(decreased strength/activity tolerance)         Wheelchair     Assist   Type of Wheelchair: Manual    Wheelchair assist level: Minimal  Assistance - Patient > 75% Max wheelchair distance: 70'    Wheelchair 50 feet with 2 turns activity    Assist        Assist Level: Supervision/Verbal cueing   Wheelchair 150 feet activity     Assist  Wheelchair 150 feet activity did not occur: Safety/medical concerns(decreased strength/activity tolerance)       Blood pressure 107/80, pulse 61, temperature 97.9 F (36.6 C), temperature source Oral, resp. rate 16, height 5\' 6"  (1.676 m), weight 43.5 kg, SpO2 95 %.  Medical Problem List and Plan: 1.  Decrease in strength, mobility and coordination secondary to cervical synovial cyst, spinal cord compression, cervical spondylolisthesis with radiculopathy C3-4.S/P posterior cervical laminectomy for synovial cyst resection, posterior cervical arthrodesis with anterior instrumentation 01/19/2019.  No cervical brace required             -Will discuss with surgery team to determine when patient may shower.  11/27- will let pt shower with covering incisions- told OT.             -ELOS/Goals: Mod I PT and OT, I SLP 2.  Antithrombotics: -DVT/anticoagulation: SCDs.  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             -antiplatelet therapy: N/A 3. Pain Management: Celebrex 200 mg every 12 hours, Neurontin 600 mg 3 times daily, oxycodone/tramadol and Valium as needed  11/27- pain usually controlled except over L shoulder bruise- which is significant.   11/30: Ordered heating pad for bilateral shoulder pain--avoid application over bruise. 4. Mood: Provide emotional support             -antipsychotic agents: N/A 5. Neuropsych: This patient is capable of making decisions on her own behalf. 6. Skin/Wound Care: Routine skin checks 7. Fluids/Electrolytes/Nutrition: Routine in and outs with follow-up  chemistries. Labs stable on 11/30. 8.  Multiple sclerosis.  Diagnosed 3 years ago.  Neurology services.  Continue Inderal 60 mg twice daily. Recommend starting Baclofen 5mg  TID for spasticity.   11/24- will start Baclofen 5 mg TID for tone/spasms.   12/3- per pt, dose is good- wondering her static spasticity is same as dynamic spasticity- PT had question, but not here today. Will d/w PT tomorrow 9.  Tobacco abuse.  NicoDerm patch 10.  Neurogenic bowel/ bladder.  Modified bowel program.  Check PVR  11/24- will change caths/bladder scans to q6 hours and cath if volumes >200cc; will d/w pt if needs bowel program due to MS/new incomplete cervical myelopathy.  11/25- needs to have BM- if doesn't today, please give suppository. Isn't getting cathed based on flowsheet documentation.   11/26- Had 3 BMs yesterday- no more constipation  11/29- says is more regular  11/30: with regular BM; had one this morning.  12/2- regular BMs   11. 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 Patient unfortunately HAS to have power w/c due to dual diagnoses.    LOS: 10 days A FACE TO FACE EVALUATION WAS PERFORMED  Marrio Scribner 02/01/2019, 9:24 AM

## 2019-02-01 NOTE — Progress Notes (Signed)
Physical Therapy Session Note  Patient Details  Name: Faith [name] MRN: [id number] Date of Birth: October 10, 1955  Today's Date: 02/01/2019 PT Individual Time: 0900-1000 PT Individual Time Calculation (min): 60 min   Short Term Goals:  Week 2:  PT Short Term Goal 1 (Week 2): STG=LTG due to ELOS.  Skilled Therapeutic Interventions/Progress Updates:  Pt seated in recliner.  She had a K pad on R shoulder, rating pain 5/10.    Josh from [employer] here for w/c eval.  Josh noted pt's home measurements from [place].  He will bring a loaner tomorrow, and deliver it to the home on Monday.  He stated that truck ramp that pt will have access to, will accomodate power w/c.  Plan is for 22" wide base with custom seating and power tilt feature, with sensitivity of joystick adjusted for her R UE function and spasticity.  PROM bil ankles.  neuromuscular re-education via multimodal cues and demo in sitting-  active DF with eversion component; in standing with RW- mini squats.  Therapeutic activity: sit>< stand with close supervision x 3 within session.  Standing balance training with bil UE support> 1UE support> 0UE support x 30 seconds before LOB backwards.  Pt stands with L knee slightly flexed.  Dr. Dagoberto Ligas stopped in to discuss LT prognosis (re: possible MS exacerbation) and need for a power w/c that will accommodate seating and function changes,  PRN.  Pt verbalized understanding and did not appear upset.   At end of session, pt seated in w/c with needs at hand, and K pad secured on R shoulder using a towel.  Pt stated that the K pad felt better.     Therapy Documentation Precautions:  Precautions Precautions: Fall, Cervical Precaution Booklet Issued: Yes (comment) Precaution Comments: reviewed log roll technique and all cervical precautions Required Braces or Orthoses: (no brace needed per orders) Restrictions Weight Bearing Restrictions: No       Therapy/Group: Individual  Therapy  Nakoa Ganus 02/01/2019, 4:42 PM

## 2019-02-02 ENCOUNTER — Ambulatory Visit (HOSPITAL_COMMUNITY): Payer: Medicaid Other | Admitting: Physical Therapy

## 2019-02-02 ENCOUNTER — Inpatient Hospital Stay (HOSPITAL_COMMUNITY): Payer: Medicaid Other | Admitting: Physical Therapy

## 2019-02-02 ENCOUNTER — Encounter (HOSPITAL_COMMUNITY): Payer: Medicaid Other

## 2019-02-02 ENCOUNTER — Inpatient Hospital Stay (HOSPITAL_COMMUNITY): Payer: Medicaid Other

## 2019-02-02 NOTE — Progress Notes (Signed)
Physical Therapy Session Note  Patient Details  Name: Faith [name] MRN: [id number] Date of Birth: 12-23-1955  Today's Date: 02/02/2019  PT Individual Time: 0900-0945 AND 1140-1205(make up time) and 1300-1345 PT Individual Time Calculation (min): 45 min and 25 min and 45 min   Short Term Goals: Week 2:  PT Short Term Goal 1 (Week 2): STG=LTG due to ELOS.  Skilled Therapeutic Interventions/Progress Updates:  Session 1.  Pt received sitting in WC and agreeable to PT. Daughter present for family education. [employer] present for [facility] training with new loaner power WC. WC mobility and education provided by [employer] and PT. Pt propelled poewqr WC 2x 129f +1025fwith supervsion assist. PT built up joistck to improve control and reduce hand fatigue.   Stair management with 1 rail on the L with CGA from PT for safety x 4 steps. Pt self selected step to gait pattern leading with the LLE. Daughter reports that pt is at baseline with safety and technique for 4 step management.      Gait training with RW 2 x 10 ft in room with supervision assist for safety. Education to daughter on need to remain in arms reach to proved supervision assist when pt fatigue.    Toileting with supervision assist and RW. Pt able to perform all pericare and clothing management with supervisoin assist from PT for safety.   Session 2 (make up) Pt received sitting in WC and agreeable to PT. Pt transferred to power WC. PT treatment focused on improved control of power WC through rehab unit and to navigate obstacles in floor with supervision assist 2 x [id number] through cones. Patient returned to room and left sitting in recliner. with call bell in reach and all needs met.        Session 3.  Pt received sitting in recliner and agreeable to PT. Stand pivot transfer to power WC with supervision assist RW. WC mobility training x [id number] supervision assist on low speed setting. PT instructed pt in dynamic balance training to perform  lateral R and L 2 x 8 each to obtain and toss bean bag to target. CGA-min assist for safety, AD management, trunk control with lateral reach and use of ankle strategy to correct anterior LOB.  Pt returned to room and performed stand pivot transfer to bed with supervision assist and RW. Sit>supine completed with supervision assist, and left supine in bed with call bell in reach and all needs met.        Therapy Documentation Precautions:  Precautions Precautions: Fall, Cervical Precaution Booklet Issued: Yes (comment) Precaution Comments: reviewed log roll technique and all cervical precautions Required Braces or Orthoses: (no brace needed per orders) Restrictions Weight Bearing Restrictions: No   Pain:   denies at rest   Therapy/Group: Individual Therapy  AustiLorie Phenix/2020, 9:52 AM

## 2019-02-02 NOTE — Plan of Care (Signed)
  Problem: Activity: Goal: Ability to avoid complications of mobility impairment will improve Outcome: Progressing Goal: Ability to tolerate increased activity will improve Outcome: Progressing Goal: Will remain free from falls Outcome: Progressing   Problem: Clinical Measurements: Goal: Ability to maintain clinical measurements within normal limits will improve Outcome: Progressing Goal: Postoperative complications will be avoided or minimized Outcome: Progressing   Problem: Pain Management: Goal: Pain level will decrease Outcome: Progressing   Problem: Skin Integrity: Goal: Will show signs of wound healing Outcome: Progressing   Problem: Consults Goal: RH GENERAL PATIENT EDUCATION Description: See Patient Education module for education specifics. Outcome: Progressing   Problem: Consults Goal: RH SPINAL CORD INJURY PATIENT EDUCATION Description:  See Patient Education module for education specifics.  Outcome: Progressing Goal: Skin Care Protocol Initiated - if Braden Score 18 or less Description: If consults are not indicated, leave blank or document N/A Outcome: Progressing Goal: Nutrition Consult-if indicated Outcome: Progressing Goal: Diabetes Guidelines if Diabetic/Glucose > 140 Description: If diabetic or lab glucose is > 140 mg/dl - Initiate Diabetes/Hyperglycemia Guidelines & Document Interventions  Outcome: Progressing   Problem: SCI BOWEL ELIMINATION Goal: RH STG MANAGE BOWEL WITH ASSISTANCE Description: STG Manage Bowel with mod Assistance. Outcome: Progressing   Problem: SCI BLADDER ELIMINATION Goal: RH STG MANAGE BLADDER WITH ASSISTANCE Description: STG Manage Bladder With mod Assistance Outcome: Progressing   Problem: RH SKIN INTEGRITY Goal: RH STG SKIN FREE OF INFECTION/BREAKDOWN Description: Skin free from skin infection entire stay on rehab Outcome: Progressing   Problem: RH SAFETY Goal: RH STG ADHERE TO SAFETY PRECAUTIONS  W/ASSISTANCE/DEVICE Description: STG Adhere to Safety Precautions With  mod Assistance/Device. Outcome: Progressing   Problem: RH PAIN MANAGEMENT Goal: RH STG PAIN MANAGED AT OR BELOW PT'S PAIN GOAL Description: Pain below2 Outcome: Progressing

## 2019-02-02 NOTE — Progress Notes (Signed)
Faith [name]   Subjective/Complaints:  Pt reports feeling "great" today- slept fantastic and muscle tightness and back pain 100% better today. Today is a good day.  ROS- denies SOB, CP, N/V/D.    Objective:   No results found. No results for input(s): WBC, HGB, HCT, PLT in the last 72 hours. No results for input(s): NA, K, CL, CO2, GLUCOSE, BUN, CREATININE, CALCIUM in the last 72 hours.  Intake/Output Summary (Last 24 hours) at 02/02/2019 0825 Last data filed at 02/02/2019 0753 Gross per 24 hour  Intake 480 ml  Output -  Net 480 ml     Physical Exam: Vital Signs Blood pressure 110/75, pulse (!) 58, temperature 98.6 F (37 C), temperature source Oral, resp. rate 16, height 5\' 6"  (1.676 m), weight 43.5 kg, SpO2 95 %. Vitals and labs reviewed- Gen- awake, alert, appropriate,sitting up in manual w/c, doing grooming at sink, OT at bedside, NAD HEENT- conjugate gaze; no facial assymmetry CV- RRR Pulm- CTA B/L GI- soft, NT, ND;  (+)BS MS-  LUE- deltoid/bicep/tricep 4/5; WE/grip/finger abd 4-/5 RUE- Delt/bicep/tricep 4-/5 and WE/grip and finger abd 3+/5  - hands can make fists much better than last week  RLE HF 3/5, KE 4-/5, DF/PF 4/5  LLE- HF 3/5, KE 4-/5, DF/PF 4/5 Neuro- MAS of 1- 1+ in LEs spasticity-  Skin- no skin breakdown seen on exam currently- has anterior incision glued on anterior cervical neck- looks good- no drainage/no erythema and posterior incision as well- both glued - also has angry purple/yellow bruise on L shoulder/neck area that appears uncomfortable- is improving    Assessment/Plan: 1. Functional deficits secondary to cervical myelopathy which require 3+ hours per day of interdisciplinary therapy in a comprehensive inpatient rehab setting.  Physiatrist is providing close team supervision and 24 hour management of active medical problems listed below.  Physiatrist and rehab team continue to assess barriers  to discharge/monitor patient progress toward functional and medical goals  Care Tool:  Bathing    Body parts bathed by patient: Right arm, Left arm, Chest, Abdomen, Front perineal area, Buttocks, Right upper leg, Left upper leg, Face, Right lower leg, Left lower leg   Body parts bathed by helper: Right lower leg, Left lower leg     Bathing assist Assist Level: Supervision/Verbal cueing     Upper Body Dressing/Undressing Upper body dressing   What is the patient wearing?: Pull over shirt    Upper body assist Assist Level: Set up assist    Lower Body Dressing/Undressing Lower body dressing      What is the patient wearing?: Underwear/pull up, Pants     Lower body assist Assist for lower body dressing: Supervision/Verbal cueing     Toileting Toileting    Toileting assist Assist for toileting: Supervision/Verbal cueing     Transfers Chair/bed transfer  Transfers assist     Chair/bed transfer assist level: Supervision/Verbal cueing Chair/bed transfer assistive device: Programmer, multimedia   Ambulation assist      Assist level: Supervision/Verbal cueing Assistive device: Walker-rolling Max distance: 88'   Walk 10 feet activity   Assist     Assist level: Supervision/Verbal cueing Assistive device: Walker-rolling   Walk 50 feet activity   Assist Walk 50 feet with 2 turns activity did not occur: Safety/medical concerns(decreased strength/activity tolerance)  Assist level: Supervision/Verbal cueing Assistive device: Walker-rolling    Walk 150 feet activity   Assist Walk 150 feet activity did not occur: Safety/medical concerns(decreased strength/activity  tolerance)         Walk 10 feet on uneven surface  activity   Assist Walk 10 feet on uneven surfaces activity did not occur: Safety/medical concerns(decreased strength/activity tolerance)         Wheelchair     Assist   Type of Wheelchair: Manual    Wheelchair  assist level: Minimal Assistance - Patient > 75% Max wheelchair distance: 80'    Wheelchair 50 feet with 2 turns activity    Assist        Assist Level: Supervision/Verbal cueing   Wheelchair 150 feet activity     Assist  Wheelchair 150 feet activity did not occur: Safety/medical concerns(decreased strength/activity tolerance)       Blood pressure 110/75, pulse (!) 58, temperature 98.6 F (37 C), temperature source Oral, resp. rate 16, height 5\' 6"  (1.676 m), weight 43.5 kg, SpO2 95 %.  Medical Problem List and Plan: 1.  Decrease in strength, mobility and coordination secondary to cervical synovial cyst, spinal cord compression, cervical spondylolisthesis with radiculopathy C3-4.S/P posterior cervical laminectomy for synovial cyst resection, posterior cervical arthrodesis with anterior instrumentation 01/19/2019.  No cervical brace required             -Will discuss with surgery team to determine when patient may shower.  11/27- will let pt shower with covering incisions- told OT.             -ELOS/Goals: Mod I PT and OT, I SLP 2.  Antithrombotics: -DVT/anticoagulation: SCDs.  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             -antiplatelet therapy: N/A 3. Pain Management: Celebrex 200 mg every 12 hours, Neurontin 600 mg 3 times daily, oxycodone/tramadol and Valium as needed  11/27- pain usually controlled except over L shoulder bruise- which is significant.   11/30: Ordered heating pad for bilateral shoulder pain--avoid application over bruise. 4. Mood: Provide emotional support             -antipsychotic agents: N/A 5. Neuropsych: This patient is capable of making decisions on her own behalf. 6. Skin/Wound Care: Routine skin checks 7. Fluids/Electrolytes/Nutrition: Routine in  and outs with follow-up chemistries. Labs stable on 11/30. 8.  Multiple sclerosis.  Diagnosed 3 years ago.  Neurology services.  Continue Inderal 60 mg twice daily. Recommend starting Baclofen 5mg  TID for spasticity.   11/24- will start Baclofen 5 mg TID for tone/spasms.   12/3- per pt, dose is good- wondering her static spasticity is same as dynamic spasticity- PT had question, but not here today. Will d/w PT tomorrow  12/4- spasticity is better today. 9.  Tobacco abuse.  NicoDerm patch 10.  Neurogenic bowel/ bladder.  Modified bowel program.  Check PVR  11/24- will change caths/bladder scans to q6 hours and cath if volumes >200cc; will d/w pt if needs bowel program due to MS/new incomplete cervical myelopathy.  11/25- needs to have BM- if doesn't today, please give suppository. Isn't getting cathed based on flowsheet documentation.   11/26- Had 3 BMs yesterday- no more constipation  11/29- says is more regular  11/30: with regular BM; had one this morning.  12/2- regular BMs   11. W/C dependence  Patient has relapsing remitting MS and C4 ASIA D incomplete cervical myelopathy due to cervical stenosis- and therefore requires  a power w/c due to hand and UE weakness- she requires tilt in space and recline (due to progressive MS orthostatic hypotension) and tilt in space required due to pressure relief- unable to do adequately with UE weakness; and also would strongly benefit from power seat elevator to reach counters in kitchen since dependent on w/c for any movement in the long run and helps with transfers in/out bed to go downhill. Patient unfortunately HAS to have power w/c due to dual diagnoses.    LOS: 11 days A FACE TO FACE EVALUATION WAS PERFORMED  Shermar Friedland 02/02/2019, 8:25 AM

## 2019-02-02 NOTE — Progress Notes (Signed)
Occupational Therapy Session Note  Patient Details  Name: Faith [name] MRN: [id number] Date of Birth: 11/28/1955  Today's Date: 02/02/2019 OT Individual Time: 0700-0810 OT Individual Time Calculation (min): 70 min    Short Term Goals: Week 2:  OT Short Term Goal 1 (Week 2): STG=LTG secondary to ELOS  Skilled Therapeutic Interventions/Progress Updates: Pt asleep upon arrival but easily aroused.  OT intervention with focus on BUE FMC/gross motor function during self feeding, bathing/dressing with sit<>stand at sink, functional amb with RW, toilet transfers, toileting, and activity tolerance to increase independence with BADLs. Pt amb in room with RW CGA/supervision.  Pt completed bathing/dressing tasks with supervision.  Pt required CGA for toilet tranfser this morning. Pt requires more than a reasonable amount of time to complete all tasks with multiple rest breaks.  Pt remained in recliner with all needs within reach and seat alarm activated.   Therapy Documentation Precautions:  Precautions Precautions: Fall, Cervical Precaution Booklet Issued: Yes (comment) Precaution Comments: reviewed log roll technique and all cervical precautions Required Braces or Orthoses: (no brace needed per orders) Restrictions Weight Bearing Restrictions: No   Pain:  Pt c/o 1/10 pain after bathing/dressing; repositioned   Therapy/Group: Individual Therapy  Leroy Libman 02/02/2019, 8:15 AM

## 2019-02-02 NOTE — Progress Notes (Signed)
Occupational Therapy Session Note  Patient Details  Name: Faith [name] MRN: [id number] Date of Birth: [birth year]  Today's Date: 02/02/2019 OT Individual Time: [id number] OT Individual Time Calculation (min): 45 min    Short Term Goals: Week 2:  OT Short Term Goal 1 (Week 2): STG=LTG secondary to ELOS  Skilled Therapeutic Interventions/Progress Updates:    PT resting in recliner upon arrival.  Daughter present for education.  Discussed current assist level and energy conservation strategies.  Educated pt and daughter of home safety and general safety awareness. Pt practiced walk-in shower transfers with daughter at supervision level.  Recommended pt practiced 2-3 times at home before taking shower. Reinforced recommendation that pt ALWAYS use RW when going into bathroom. Pt and daughter verbalized understanding.  Recommended that son or daughter always be present when pt entering/exiting shower and available when pt in shower. Pt and daughter verbalized understanding. Pt returned to recliner and remained in recliner with all needs within each, seat alarm activated, and daughter present.   Therapy Documentation Precautions:  Precautions Precautions: Fall, Cervical Precaution Booklet Issued: Yes (comment) Precaution Comments: reviewed log roll technique and all cervical precautions Required Braces or Orthoses: (no brace needed per orders) Restrictions Weight Bearing Restrictions: No :  Pain: Pt c/o 5/10 neck and shoulder pain; repositioned and RN notified  Therapy/Group: Individual Therapy  Leroy Libman 02/02/2019, 12:27 PM

## 2019-02-03 ENCOUNTER — Inpatient Hospital Stay (HOSPITAL_COMMUNITY): Payer: Medicaid Other

## 2019-02-03 NOTE — Progress Notes (Signed)
[facility] PHYSICAL MEDICINE & REHABILITATION PROGRESS NOTE   Subjective/Complaints:  Slept ok, tingling in fingers a little better today   ROS- denies SOB, CP, N/V/D.    Objective:   No results found. No results for input(s): WBC, HGB, HCT, PLT in the last 72 hours. No results for input(s): NA, K, CL, CO2, GLUCOSE, BUN, CREATININE, CALCIUM in the last 72 hours.  Intake/Output Summary (Last 24 hours) at 02/03/2019 0733 Last data filed at 02/02/2019 1817 Gross per 24 hour  Intake 680 ml  Output -  Net 680 ml     Physical Exam: Vital Signs Blood pressure 105/67, pulse 63, temperature (!) 97.5 F (36.4 C), temperature source Oral, resp. rate 16, height 5\' 6"  (1.676 m), weight 43.5 kg, SpO2 100 %. Vitals and labs reviewed- Gen- awake, alert, appropriate,sitting up in manual w/c, doing grooming at sink, OT at bedside, NAD HEENT- conjugate gaze; no facial assymmetry CV- RRR Pulm- CTA B/L GI- soft, NT, ND;  (+)BS MS-  LUE- deltoid/bicep/tricep 4/5; WE/grip/finger abd 4-/5 RUE- Delt/bicep/tricep 4-/5 and WE/grip and finger abd 3+/5  - hands can make fists much better than last week  RLE HF 3-/5, KE 4-/5, DF/PF 4/5  LLE- HF 3/5, KE 4-/5, DF/PF 4/5 Sensation intact LT BUE and BLE  Neuro- MAS of 1- 1+ in LEs spasticity-  Skin- no skin breakdown seen on exam currently- has anterior incision glued on anterior cervical neck- looks good- no drainage/no erythema and posterior incision as well- both glued - also has angry purple/yellow bruise on L shoulder/neck area that appears uncomfortable- is improving    Assessment/Plan: 1. Functional deficits secondary to cervical myelopathy which require 3+ hours per day of interdisciplinary therapy in a comprehensive inpatient rehab setting.  Physiatrist is providing close team supervision and 24 hour management of active medical problems listed below.  Physiatrist and rehab team continue to assess barriers to discharge/monitor patient  progress toward functional and medical goals  Care Tool:  Bathing    Body parts bathed by patient: Right arm, Left arm, Chest, Abdomen, Front perineal area, Buttocks, Right upper leg, Left upper leg, Face, Right lower leg, Left lower leg   Body parts bathed by helper: Right lower leg, Left lower leg     Bathing assist Assist Level: Supervision/Verbal cueing     Upper Body Dressing/Undressing Upper body dressing   What is the patient wearing?: Pull over shirt    Upper body assist Assist Level: Set up assist    Lower Body Dressing/Undressing Lower body dressing      What is the patient wearing?: Underwear/pull up, Pants     Lower body assist Assist for lower body dressing: Supervision/Verbal cueing     Toileting Toileting    Toileting assist Assist for toileting: Supervision/Verbal cueing     Transfers Chair/bed transfer  Transfers assist     Chair/bed transfer assist level: Supervision/Verbal cueing Chair/bed transfer assistive device: Programmer, multimedia   Ambulation assist      Assist level: Supervision/Verbal cueing Assistive device: Walker-rolling Max distance: 88'   Walk 10 feet activity   Assist     Assist level: Supervision/Verbal cueing Assistive device: Walker-rolling   Walk 50 feet activity   Assist Walk 50 feet with 2 turns activity did not occur: Safety/medical concerns(decreased strength/activity tolerance)  Assist level: Supervision/Verbal cueing Assistive device: Walker-rolling    Walk 150 feet activity   Assist Walk 150 feet activity did not occur: Safety/medical concerns(decreased strength/activity tolerance)  Walk 10 feet on uneven surface  activity   Assist Walk 10 feet on uneven surfaces activity did not occur: Safety/medical concerns(decreased strength/activity tolerance)         Wheelchair     Assist   Type of Wheelchair: Manual    Wheelchair assist level: Minimal Assistance -  Patient > 75% Max wheelchair distance: 65'    Wheelchair 50 feet with 2 turns activity    Assist        Assist Level: Supervision/Verbal cueing   Wheelchair 150 feet activity     Assist  Wheelchair 150 feet activity did not occur: Safety/medical concerns(decreased strength/activity tolerance)       Blood pressure 105/67, pulse 63, temperature (!) 97.5 F (36.4 C), temperature source Oral, resp. rate 16, height 5\' 6"  (1.676 m), weight 43.5 kg, SpO2 100 %.  Medical Problem List and Plan: 1.  Decrease in strength, mobility and coordination secondary to cervical synovial cyst, spinal cord compression, cervical spondylolisthesis with radiculopathy C3-4.S/P posterior cervical laminectomy for synovial cyst resection, posterior cervical arthrodesis with anterior instrumentation 01/19/2019.  No cervical brace required             -Cont rehab PT,  OT              -ELOS/Goals: Mod I PT and OT, I SLP 2.  Antithrombotics: -DVT/anticoagulation: SCDs.  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             -antiplatelet therapy: N/A 3. Pain Management: Celebrex 200 mg every 12 hours, Neurontin 600 mg 3 times daily, oxycodone/tramadol and Valium as needed  11/27- pain usually controlled except over L shoulder bruise- which is significant.   11/30: Ordered heating pad for bilateral shoulder pain--avoid application over bruise. 4. Mood: Provide emotional support             -antipsychotic agents: N/A 5. Neuropsych: This patient is capable of making decisions on her own behalf. 6. Skin/Wound Care: Routine skin checks 7. Fluids/Electrolytes/Nutrition: Routine in and outs with follow-up chemistries. Labs stable on 11/30. 8.  Multiple sclerosis.  Diagnosed 3 years ago.  Neurology services.  Continue  Inderal 60 mg twice daily. Recommend starting Baclofen 5mg  TID for spasticity.   11/24- will start Baclofen 5 mg TID for tone/spasms.   12/3- per pt, dose is good- wondering her static spasticity is same as dynamic spasticity- PT had question, but not here today. Will d/w PT tomorrow  12/4- spasticity is better today. 9.  Tobacco abuse.  NicoDerm patch 10.  Neurogenic bowel/ bladder.  Modified bowel program.  Check PVR  11/24- will change caths/bladder scans to q6 hours and cath if volumes >200cc; will d/w pt if needs bowel program due to MS/new incomplete cervical myelopathy.  11/25- needs to have BM- if doesn't today, please give suppository. Isn't getting cathed based on flowsheet documentation.   11/26- Had 3 BMs yesterday- no more constipation  11/29- says is more regular  11/30: with regular BM; had one this morning.  12/2- regular BMs  Voiding cont LBM 12/4 11. 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 Patient unfortunately HAS to have power w/c due to dual diagnoses.    LOS: 12 days A FACE TO FACE EVALUATION WAS PERFORMED  Charlett Blake 02/03/2019, 7:33 AM

## 2019-02-03 NOTE — Progress Notes (Signed)
Occupational Therapy Session Note  Patient Details  Name: Faith [name] MRN: [id number] Date of Birth: 1955-04-17  Today's Date: 02/03/2019 OT Individual Time: 1400-1515 OT Individual Time Calculation (min): 75 min    Short Term Goals: Week 2:  OT Short Term Goal 1 (Week 2): STG=LTG secondary to ELOS  Skilled Therapeutic Interventions/Progress Updates:    1;1. Pt received in bed with minimal pain and just received meds. Pt ambulates to bathroom with RW and S for toileting/transfer. Pt washes hands in standing at sink and grooms seated in PWC. Pt maneuvers PWC with S to/from all tx destinations. Pt completes standing bean bag toss crossing midline and reaching laterally with S overall for standing balance. Pt manuevers PWC to retrieve items off floor with reacher in between rounds of cornhole. Pt completes practicing of figure 8 in between cones for close quarters steering and backing into elevator with min A for steering with PWC in prep for community mobility. Exited session with pt setae din bed, all needs in reahc and exit alarm on  Therapy Documentation Precautions:  Precautions Precautions: Fall, Cervical Precaution Booklet Issued: Yes (comment) Precaution Comments: reviewed log roll technique and all cervical precautions Required Braces or Orthoses: (no brace needed per orders) Restrictions Weight Bearing Restrictions: No General:   Vital Signs: Therapy Vitals Temp: 97.8 F (36.6 C) Pulse Rate: 67 Resp: 18 BP: 104/71 Patient Position (if appropriate): Lying Oxygen Therapy SpO2: 100 % O2 Device: Room Air Pain: Pain Assessment Pain Scale: 0-10 Pain Score: 6  Pain Type: Chronic pain Pain Location: Neck Pain Descriptors / Indicators: Aching Pain Frequency: Constant Pain Onset: On-going Pain Intervention(s): Medication (See eMAR) ADL: ADL Equipment Provided: Long-handled sponge Grooming: Minimal assistance Where Assessed-Grooming: Sitting at sink Upper Body  Bathing: Supervision/safety Where Assessed-Upper Body Bathing: Shower Lower Body Bathing: Contact guard Where Assessed-Lower Body Bathing: Shower Upper Body Dressing: Setup Where Assessed-Upper Body Dressing: Chair Lower Body Dressing: Contact guard Where Assessed-Lower Body Dressing: Chair Toileting: Contact guard Where Assessed-Toileting: Bedside Commode, Control and instrumentation engineer Transfer: Therapist, music Method: Arts development officer: Radiographer, therapeutic: Curator: Curator Method: Stand pivot Youth worker: Radio broadcast assistant, Systems analyst    Praxis   Exercises:   Other Treatments:     Therapy/Group: Individual Therapy  Tonny Branch 02/03/2019, 2:10 PM

## 2019-02-04 ENCOUNTER — Inpatient Hospital Stay (HOSPITAL_COMMUNITY): Payer: Medicaid Other

## 2019-02-04 NOTE — Progress Notes (Signed)
[place] PHYSICAL MEDICINE & REHABILITATION PROGRESS NOTE   Subjective/Complaints:  Right hand feels stronger today, good BN this am, feels sportsteme is helping shoulder pain a lot   ROS- denies SOB, CP, N/V/D.    Objective:   No results found. No results for input(s): WBC, HGB, HCT, PLT in the last 72 hours. No results for input(s): NA, K, CL, CO2, GLUCOSE, BUN, CREATININE, CALCIUM in the last 72 hours.  Intake/Output Summary (Last 24 hours) at 02/04/2019 0830 Last data filed at 02/04/2019 0700 Gross per 24 hour  Intake 240 ml  Output -  Net 240 ml     Physical Exam: Vital Signs Blood pressure 112/67, pulse 62, temperature 97.6 F (36.4 C), resp. rate 15, height 5\' 6"  (1.676 m), weight 43.5 kg, SpO2 96 %. Vitals and labs reviewed- Gen- awake, alert, appropriate,sitting up in manual w/c, doing grooming at sink, OT at bedside, NAD HEENT- conjugate gaze; no facial assymmetry CV- RRR Pulm- CTA B/L GI- soft, NT, ND;  (+)BS MS-  LUE- deltoid/bicep/tricep 4/5; WE/grip/finger abd 4-/5 RUE- Delt/bicep/tricep 4-/5 and WE/grip and finger abd 3+/5  - hands can make fists much better than last week  RLE HF 3-/5, KE 4-/5, DF/PF 4/5  LLE- HF 3/5, KE 4-/5, DF/PF 4/5 Sensation intact LT BUE and BLE  Neuro- MAS of 1- 1+ in LEs spasticity-  Skin- no skin breakdown seen on exam currently- has anterior incision glued on anterior cervical neck- looks good- no drainage/no erythema and posterior incision as well- both glued - also has angry purple/yellow bruise on L shoulder/neck area that appears uncomfortable- is improving    Assessment/Plan: 1. Functional deficits secondary to cervical myelopathy which require 3+ hours per day of interdisciplinary therapy in a comprehensive inpatient rehab setting.  Physiatrist is providing close team supervision and 24 hour management of active medical problems listed below.  Physiatrist and rehab team continue to assess barriers to discharge/monitor  patient progress toward functional and medical goals  Care Tool:  Bathing    Body parts bathed by patient: Right arm, Left arm, Chest, Abdomen, Front perineal area, Buttocks, Right upper leg, Left upper leg, Face, Right lower leg, Left lower leg   Body parts bathed by helper: Right lower leg, Left lower leg     Bathing assist Assist Level: Supervision/Verbal cueing     Upper Body Dressing/Undressing Upper body dressing   What is the patient wearing?: Pull over shirt    Upper body assist Assist Level: Set up assist    Lower Body Dressing/Undressing Lower body dressing      What is the patient wearing?: Underwear/pull up, Pants     Lower body assist Assist for lower body dressing: Supervision/Verbal cueing     Toileting Toileting    Toileting assist Assist for toileting: Supervision/Verbal cueing     Transfers Chair/bed transfer  Transfers assist     Chair/bed transfer assist level: Supervision/Verbal cueing Chair/bed transfer assistive device: Programmer, multimedia   Ambulation assist      Assist level: Supervision/Verbal cueing Assistive device: Walker-rolling Max distance: 88'   Walk 10 feet activity   Assist     Assist level: Supervision/Verbal cueing Assistive device: Walker-rolling   Walk 50 feet activity   Assist Walk 50 feet with 2 turns activity did not occur: Safety/medical concerns(decreased strength/activity tolerance)  Assist level: Supervision/Verbal cueing Assistive device: Walker-rolling    Walk 150 feet activity   Assist Walk 150 feet activity did not occur: Safety/medical concerns(decreased strength/activity  tolerance)         Walk 10 feet on uneven surface  activity   Assist Walk 10 feet on uneven surfaces activity did not occur: Safety/medical concerns(decreased strength/activity tolerance)         Wheelchair     Assist   Type of Wheelchair: Manual    Wheelchair assist level: Minimal  Assistance - Patient > 75% Max wheelchair distance: 73'    Wheelchair 50 feet with 2 turns activity    Assist        Assist Level: Supervision/Verbal cueing   Wheelchair 150 feet activity     Assist  Wheelchair 150 feet activity did not occur: Safety/medical concerns(decreased strength/activity tolerance)       Blood pressure 112/67, pulse 62, temperature 97.6 F (36.4 C), resp. rate 15, height 5\' 6"  (1.676 m), weight 43.5 kg, SpO2 96 %.  Medical Problem List and Plan: 1.  Decrease in strength, mobility and coordination secondary to cervical synovial cyst, spinal cord compression, cervical spondylolisthesis with radiculopathy C3-4.S/P posterior cervical laminectomy for synovial cyst resection, posterior cervical arthrodesis with anterior instrumentation 01/19/2019.  No cervical brace required             -Cont rehab PT,  OT              -ELOS/Goals: Mod I PT and OT, I SLP 2.  Antithrombotics: -DVT/anticoagulation: SCDs.  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             -antiplatelet therapy: N/A 3. Pain Management: Celebrex 200 mg every 12 hours, Neurontin 600 mg 3 times daily, oxycodone/tramadol and Valium as needed  11/27- pain usually controlled except over L shoulder bruise- which is significant.   11/30: Ordered heating pad for bilateral shoulder pain--using sportscreme with great success 4. Mood: Provide emotional support             -antipsychotic agents: N/A 5. Neuropsych: This patient is capable of making decisions on her own behalf. 6. Skin/Wound Care: Routine skin checks 7. Fluids/Electrolytes/Nutrition: Routine in and outs with follow-up chemistries. Labs stable on 11/30. 8.  Multiple sclerosis.  Diagnosed 3 years ago.  Neurology services.  Continue Inderal 60 mg  twice daily. Cont  Baclofen 5mg  TID for spasticity.   11/24- will start Baclofen 5 mg TID for tone/spasms.   12/3- per pt, dose is good- wondering her static spasticity is same as dynamic spasticity- PT had question, but not here today. Will d/w PT tomorrow  12/4- spasticity is better today. 9.  Tobacco abuse.  NicoDerm patch 10.  Neurogenic bowel/ bladder.  Modified bowel program.  Check PVR  11/24- will change caths/bladder scans to q6 hours and cath if volumes >200cc; will d/w pt if needs bowel program due to MS/new incomplete cervical myelopathy.  11/25- needs to have BM- if doesn't today, please give suppository. Isn't getting cathed based on flowsheet documentation.   11/26- Had 3 BMs yesterday- no more constipation  11/29- says is more regular  11/30: with regular BM; had one this morning.  12/2- regular BMs  Voiding cont LBM today per pt not recorded yet, states it was a nl BM  11. 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 Patient unfortunately HAS to have power w/c due to dual diagnoses.    LOS: 13 days A FACE TO FACE EVALUATION WAS PERFORMED  Charlett Blake 02/04/2019, 8:30 AM

## 2019-02-04 NOTE — Progress Notes (Addendum)
Physical Therapy Session Note  Patient Details  Name: Faith [name] MRN: [id number] Date of Birth: [birth year]  Today's Date: 02/04/2019 PT Individual Time: 0900-1000 PT Individual Time Calculation (min): 60 min   Short Term Goals: Week 2:  PT Short Term Goal 1 (Week 2): STG=LTG due to ELOS.  Skilled Therapeutic Interventions/Progress Updates:     Patient in bed upon PT arrival. Patient alert and agreeable to PT session. Patient reported 2-3/10 neck and shpulder pain during session, reported muscle rub placed prior to session. PT provided repositioning, rest breaks, and distraction as pain interventions throughout session.   Therapeutic Activity: Bed Mobility: Patient performed supine to/from sit with mod I using the bed rail, patient has hospital bed at home. Patient donned B TED hose with max A seated EOB. PT laced elastic shoe laces and patient donned tennis shoes with mod A. Provided cues for placing LE on knee to place shoe then placing her foot on the floor to slide her foot into the shoe.  Transfers: Patient performed sit to/from stand x2, furniture transfer in ADL recliner, and stand pivot using a RW with supervision-mod I. Provided verbal cues for hand placement on RW and reaching back to sit x1.   Gait Training:  Patient ambulated 50 feet x2, 15 feet over carpet in ADL apartment during second trial, using RW with supervision. Ambulated with narrow BOS, decreased gait speed, decreased step length, decreased step height, and improved posture and looking ahead this session. Provided verbal cues for increased BOS and step height for reduced fall risk.  Wheelchair Mobility:  Patient propelled power wheelchair ~150 feet x2 forwards, 10 feet and 50 feet backwards, 2 180 degree turns, and got on/off the elevator backing in with supervision for steering/safety. Provided verbal cues for directions for steering backwards and hand placement for propulsion and turning technique due to decreased  fine motor coordination with R UE. Educated patient on other power chair functions including elevating the seat, tilt in space for pressure relief or comfort, recline, and LE elevation patient able to recall and perform all functions with min cues.   Patient in bed at end of session with breaks locked, bed alarm set, and all needs within reach. Discussed d/c plan, goals, and home safety at end of session.    Therapy Documentation Precautions:  Precautions Precautions: Fall, Cervical Precaution Booklet Issued: Yes (comment) Precaution Comments: reviewed log roll technique and all cervical precautions Required Braces or Orthoses: (no brace needed per orders) Restrictions Weight Bearing Restrictions: No    Therapy/Group: Individual Therapy  Jovonna Nickell L Kaisha Wachob PT, DPT  02/04/2019, 12:29 PM

## 2019-02-05 ENCOUNTER — Inpatient Hospital Stay (HOSPITAL_COMMUNITY): Payer: Medicaid Other

## 2019-02-05 LAB — CBC WITH DIFFERENTIAL/PLATELET
Abs Immature Granulocytes: 0.05 10*3/uL (ref 0.00–0.07)
Basophils Absolute: 0 10*3/uL (ref 0.0–0.1)
Basophils Relative: 1 %
Eosinophils Absolute: 0.1 10*3/uL (ref 0.0–0.5)
Eosinophils Relative: 2 %
HCT: 44.1 % (ref 36.0–46.0)
Hemoglobin: 14.9 g/dL (ref 12.0–15.0)
Immature Granulocytes: 1 %
Lymphocytes Relative: 36 %
Lymphs Abs: 2.1 10*3/uL (ref 0.7–4.0)
MCH: 34.1 pg — ABNORMAL HIGH (ref 26.0–34.0)
MCHC: 33.8 g/dL (ref 30.0–36.0)
MCV: 100.9 fL — ABNORMAL HIGH (ref 80.0–100.0)
Monocytes Absolute: 0.7 10*3/uL (ref 0.1–1.0)
Monocytes Relative: 11 %
Neutro Abs: 2.9 10*3/uL (ref 1.7–7.7)
Neutrophils Relative %: 49 %
Platelets: 340 10*3/uL (ref 150–400)
RBC: 4.37 MIL/uL (ref 3.87–5.11)
RDW: 12.4 % (ref 11.5–15.5)
WBC: 5.9 10*3/uL (ref 4.0–10.5)
nRBC: 0 % (ref 0.0–0.2)

## 2019-02-05 LAB — BASIC METABOLIC PANEL
Anion gap: 8 (ref 5–15)
BUN: 13 mg/dL (ref 8–23)
CO2: 29 mmol/L (ref 22–32)
Calcium: 9.5 mg/dL (ref 8.9–10.3)
Chloride: 102 mmol/L (ref 98–111)
Creatinine, Ser: 0.63 mg/dL (ref 0.44–1.00)
GFR calc Af Amer: >60 mL/min (ref >60)
GFR calc non Af Amer: >60 mL/min (ref >60)
Glucose, Bld: 83 mg/dL (ref 70–99)
Potassium: 4.4 mmol/L (ref 3.5–5.1)
Sodium: 139 mmol/L (ref 135–145)

## 2019-02-05 MED ORDER — NICOTINE 7 MG/24HR TD PT24: 7.0000 mg | MEDICATED_PATCH | Freq: Every day | TRANSDERMAL | 0 refills | Status: DC | PRN

## 2019-02-05 MED ORDER — TRAMADOL HCL 50 MG PO TABS: 100.0000 mg | ORAL_TABLET | Freq: Four times a day (QID) | ORAL | 0 refills | Status: DC | PRN

## 2019-02-05 MED ORDER — VITAMIN D 50 MCG (2000 UT) PO CAPS: 2000.0000 [IU] | ORAL_CAPSULE | Freq: Two times a day (BID) | ORAL | 1 refills | Status: DC

## 2019-02-05 MED ORDER — ACETAMINOPHEN 325 MG PO TABS: 650.0000 mg | ORAL_TABLET | Freq: Four times a day (QID) | ORAL | Status: DC | PRN

## 2019-02-05 MED ORDER — BACLOFEN 5 MG PO TABS: 5.0000 mg | ORAL_TABLET | Freq: Three times a day (TID) | ORAL | 1 refills | Status: DC

## 2019-02-05 MED ORDER — GABAPENTIN 300 MG PO CAPS: 600.0000 mg | ORAL_CAPSULE | Freq: Three times a day (TID) | ORAL | 1 refills | Status: AC

## 2019-02-05 MED ORDER — CELECOXIB 200 MG PO CAPS: 200.0000 mg | ORAL_CAPSULE | Freq: Two times a day (BID) | ORAL | 1 refills | Status: DC

## 2019-02-05 MED ORDER — DIAZEPAM 5 MG PO TABS: 5.0000 mg | ORAL_TABLET | Freq: Four times a day (QID) | ORAL | 0 refills | Status: DC | PRN

## 2019-02-05 MED ORDER — PROPRANOLOL HCL 60 MG PO TABS: 60.0000 mg | ORAL_TABLET | Freq: Two times a day (BID) | ORAL | 1 refills | Status: DC

## 2019-02-05 NOTE — Plan of Care (Signed)
  Problem: Activity: Goal: Ability to avoid complications of mobility impairment will improve Outcome: Progressing Goal: Ability to tolerate increased activity will improve Outcome: Progressing Goal: Will remain free from falls Outcome: Progressing   Problem: Clinical Measurements: Goal: Ability to maintain clinical measurements within normal limits will improve Outcome: Progressing Goal: Postoperative complications will be avoided or minimized Outcome: Progressing   Problem: Pain Management: Goal: Pain level will decrease Outcome: Progressing   Problem: Skin Integrity: Goal: Will show signs of wound healing Outcome: Progressing   Problem: Consults Goal: RH GENERAL PATIENT EDUCATION Description: See Patient Education module for education specifics. Outcome: Progressing   Problem: Consults Goal: RH SPINAL CORD INJURY PATIENT EDUCATION Description:  See Patient Education module for education specifics.  Outcome: Progressing Goal: Skin Care Protocol Initiated - if Braden Score 18 or less Description: If consults are not indicated, leave blank or document N/A Outcome: Progressing Goal: Nutrition Consult-if indicated Outcome: Progressing Goal: Diabetes Guidelines if Diabetic/Glucose > 140 Description: If diabetic or lab glucose is > 140 mg/dl - Initiate Diabetes/Hyperglycemia Guidelines & Document Interventions  Outcome: Progressing   Problem: SCI BOWEL ELIMINATION Goal: RH STG MANAGE BOWEL WITH ASSISTANCE Description: STG Manage Bowel with mod Assistance. Outcome: Progressing   Problem: SCI BLADDER ELIMINATION Goal: RH STG MANAGE BLADDER WITH ASSISTANCE Description: STG Manage Bladder With mod Assistance Outcome: Progressing   Problem: RH SKIN INTEGRITY Goal: RH STG SKIN FREE OF INFECTION/BREAKDOWN Description: Skin free from skin infection entire stay on rehab Outcome: Progressing   Problem: RH SAFETY Goal: RH STG ADHERE TO SAFETY PRECAUTIONS  W/ASSISTANCE/DEVICE Description: STG Adhere to Safety Precautions With  mod Assistance/Device. Outcome: Progressing   Problem: RH PAIN MANAGEMENT Goal: RH STG PAIN MANAGED AT OR BELOW PT'S PAIN GOAL Description: Pain below2 Outcome: Progressing

## 2019-02-05 NOTE — Discharge Summary (Signed)
Physician Discharge Summary  Patient ID: Faith [name] MRN: [id number] DOB/AGE: 02-Jul-1955 63 y.o.  Admit date: 01/22/2019 Discharge date: 02/06/2019  Discharge Diagnoses:  Principal Problem:   Cervical myelopathy ([place]) Active Problems:   Multiple sclerosis ([facility]) DVT prophylaxis Pain management Neurogenic bowel and bladder Tobacco abuse  Discharged Condition: Stable  Significant Diagnostic Studies: Dg Cervical Spine 1 View  Result Date: 01/19/2019 CLINICAL DATA:  Posterior cervical laminectomy, removal of synovial cyst and posterior cervical fusion EXAM: DG CERVICAL SPINE - 1 VIEW; DG C-ARM 1-60 MIN COMPARISON:  01/18/2019 lumbar spine MRI FLUOROSCOPY TIME:  Fluoroscopy Time:  0 minutes 14 seconds Number of Acquired Spot Images: 1 FINDINGS: Nondiagnostic single spot fluoroscopic lateral intraoperative cervical spine radiograph demonstrates postsurgical changes from bilateral posterior spinal fusion at C3-4. IMPRESSION: Intraoperative fluoroscopic guidance for posterior spinal fusion. Electronically Signed   By: Ilona Sorrel M.D.   On: 01/19/2019 20:59   Dg Cervical Spine Complete  Result Date: 01/19/2019 CLINICAL DATA:  ACDF C3-C4 EXAM: CERVICAL SPINE - COMPLETE 4+ VIEW COMPARISON:  Procedural fluoroscopy. FINDINGS: Multiple cross-table lateral spot views obtained in the operating room. Patient has prior C3-C4 posterior fusion. Images obtained surgical instruments localizing anteriorly. Subsequent anterior C3-C4 fusion. IMPRESSION: Cross-table lateral spot views obtained in the operating room during surgical localization and anterior C3-C4 fusion. Electronically Signed   By: Keith Rake M.D.   On: 01/19/2019 23:24   Mr Jeri Cos And Wo Contrast  Result Date: 01/18/2019 CLINICAL DATA:  63 year old female with chronic multiple sclerosis, admitted with progressive weakness and diminished extremity sensation. EXAM: MRI HEAD WITHOUT AND WITH CONTRAST TECHNIQUE: Multiplanar, multiecho  pulse sequences of the brain and surrounding structures were obtained without and with intravenous contrast. CONTRAST:  5mL GADAVIST GADOBUTROL 1 MMOL/ML IV SOLN COMPARISON:  Brain MRI 03/16/2017 and earlier. FINDINGS: Brain: No restricted diffusion to suggest acute infarction. No midline shift, mass effect, evidence of mass lesion, ventriculomegaly, extra-axial collection or acute intracranial hemorrhage. Cervicomedullary junction and pituitary are within normal limits. Chronically advanced bilateral periventricular and central white matter T2/FLAIR signal abnormality and volume loss. Relative sparing of the subcortical white matter except in the superior frontal gyri. Compared to 2019 the cerebral white matter disease appears stable. Occasional mild areas of cortical involvement (posterior right superior frontal gyrus and mesial right temporal lobe) appear stable. Bilateral deep white matter capsule involvement appears stable, with otherwise sparing of the deep gray nuclei. The brainstem and cerebellum are also chronically spared. No lesions with restricted diffusion or enhancement are identified. No dural thickening or other abnormal intracranial enhancement. No chronic cerebral blood products identified. Stable cerebral volume. Vascular: Major intracranial vascular flow voids are preserved. The major dural venous sinuses are enhancing and appear to be patent. Skull and upper cervical spine: Cervical spine reported separately today. Bulky left frontal convexity hyperostosis of the skull or dural calcification is incidentally noted and unchanged. Normal background bone marrow signal. Sinuses/Orbits: Grossly negative orbits. Chronic left maxillary sinus disease, improved. Other paranasal sinuses are clear. Other: Mastoids are clear. Visible internal auditory structures appear normal. Scalp and face soft tissues appear negative. IMPRESSION: 1. Stable since 2019. Advanced chronic demyelinating disease with no  progressive or active demyelination identified. 2. No new intracranial abnormality. 3. Cervical spine reported separately today. Electronically Signed   By: Genevie Ann M.D.   On: 01/18/2019 22:44   Mr Cervical Spine W Wo Contrast  Result Date: 01/18/2019 CLINICAL DATA:  63 year old female with chronic multiple sclerosis, admitted with progressive weakness and  diminished extremity sensation. EXAM: MRI CERVICAL SPINE WITHOUT AND WITH CONTRAST TECHNIQUE: Multiplanar and multiecho pulse sequences of the cervical spine, to include the craniocervical junction and cervicothoracic junction, were obtained without and with intravenous contrast. CONTRAST:  35mL GADAVIST GADOBUTROL 1 MMOL/ML IV SOLN COMPARISON:  Brain MRI today reported separately. Cervical spine MRI 03/16/2017, 01/14/2016. FINDINGS: Alignment: Progressed degenerative appearing anterolisthesis of C3 on C4 since 2019, now measuring about 3 millimeters. Abnormal posterior elements at that level, see additional details below. Associated increased reversal of the normal cervical lordosis since 2019. Mild degenerative anterolisthesis at C2-C3 has also progressed. Vertebrae: Chronic abnormal marrow signal throughout the C4 vertebral body remain stable compatible with benign etiology. Continued degenerative appearing marrow edema in the right C3-C4 posterior elements, with associated enhancement. Similar new degenerative appearing marrow edema and enhancement in the left C2-C3 posterior elements. See additional details below. Background bone marrow signal remains within normal limits. Cord: New degenerative spinal stenosis with spinal cord compression at C3-C4. See series 5, image 8. See additional details below. No definite associated abnormal cord signal. Although there does appear to be associated probably degenerative posterior dural thickening and enhancement (series 12, image 8). No abnormal intradural enhancement identified. Superimposed chronic demyelinating  related cord signal abnormality redemonstrated in the right hemicord at the C4 level. Similar C5 and C6 level cord signal abnormality best seen on axial images. These appear stable. Posterior Fossa, vertebral arteries, paraspinal tissues: Brain MRI today reported separately. Preserved major vascular flow voids in the neck. Negative neck soft tissues. Negative visible right lung apex. Disc levels: C2-C3: New degenerative anterolisthesis at this level since 2019 with progressed chronic facet arthropathy, now severe. Left side degenerative facet enhancement and edema. No spinal stenosis. Severe left and mild to moderate right C3 foraminal stenosis appears increased. C3-C4: Progressed degenerative anterolisthesis and facet arthropathy at this level, now severe on the right. Degenerative right facet joint fluid, facet marrow edema and enhancement. Superimposed new bulky posteriorly situated synovial cyst on the right, 9 x 11 millimeters. See series 7, image 9 and series 8, image 7. Associated marginal enhancement of the cyst and adjacent dura on series 12, image 8. Subsequent new moderate spinal stenosis and cord mass effect. Moderate to severe bilateral C4 foraminal stenosis also appears progressed. C4-C5:  Stable mild to moderate facet hypertrophy. C5-C6: Stable disc bulge and endplate spurring without spinal stenosis. Mild to moderate C6 foraminal stenosis appears stable. C6-C7: Stable disc bulge and endplate spurring with mild ligament flavum hypertrophy. Stable borderline to mild spinal stenosis. Moderate to severe left and moderate right C7 foraminal stenosis is stable. C7-T1: Stable right eccentric disc bulge. Moderate facet hypertrophy. No spinal stenosis, but moderate to severe chronic right C8 foraminal stenosis. Mild left C8 foraminal stenosis. This level stable. Moderate to severe T1 neural foraminal stenosis is chronic and greater on the left, mostly related to facet degeneration. IMPRESSION: 1. Despite the  advanced chronic demyelinating disease the symptomatic abnormality appears to be degenerative in nature: Substantially progressed degenerative spondylolisthesis at C2-C3 and C3-C4 since 2019 with severe posterior element degeneration including a bulky right side synovial cyst at C3-C4. Subsequent new moderate spinal stenosis and spinal cord mass effect. See series 5, image 8. No associated cord edema or myelomalacia is evident. 2. Associated acute exacerbation of the chronic facet arthropathy at C2-C3 and C3-C4. Benign chronic abnormal marrow signal in the C4 vertebral body remains stable. 3. Degenerative changes elsewhere in the cervical spine are stable, including moderate and severe lower cervical  and upper thoracic neural foraminal stenosis. 4. Underlying chronic cervical cord demyelinating disease appears stable without progression. Study discussed by telephone with NP Blunt on 01/18/2019 at 23:08 . Electronically Signed   By: Genevie Ann M.D.   On: 01/18/2019 23:11   Dg C-arm 1-60 Min  Result Date: 01/19/2019 CLINICAL DATA:  Posterior cervical laminectomy, removal of synovial cyst and posterior cervical fusion EXAM: DG CERVICAL SPINE - 1 VIEW; DG C-ARM 1-60 MIN COMPARISON:  01/18/2019 lumbar spine MRI FLUOROSCOPY TIME:  Fluoroscopy Time:  0 minutes 14 seconds Number of Acquired Spot Images: 1 FINDINGS: Nondiagnostic single spot fluoroscopic lateral intraoperative cervical spine radiograph demonstrates postsurgical changes from bilateral posterior spinal fusion at C3-4. IMPRESSION: Intraoperative fluoroscopic guidance for posterior spinal fusion. Electronically Signed   By: Ilona Sorrel M.D.   On: 01/19/2019 20:59   Vas [place] Lower Extremity Venous (dvt)  Result Date: 01/23/2019  Lower Venous Study Indications: Swelling.  Comparison Study: no prior Performing Technologist: Abram Sander RVS  Examination Guidelines: A complete evaluation includes B-mode imaging, spectral Doppler, color Doppler, and power  Doppler as needed of all accessible portions of each vessel. Bilateral testing is considered an integral part of a complete examination. Limited examinations for reoccurring indications may be performed as noted.  +---------+---------------+---------+-----------+----------+--------------+ RIGHT    CompressibilityPhasicitySpontaneityPropertiesThrombus Aging +---------+---------------+---------+-----------+----------+--------------+ CFV      Full           Yes      Yes                                 +---------+---------------+---------+-----------+----------+--------------+ SFJ      Full                                                        +---------+---------------+---------+-----------+----------+--------------+ FV Prox  Full                                                        +---------+---------------+---------+-----------+----------+--------------+ FV Mid   Full                                                        +---------+---------------+---------+-----------+----------+--------------+ FV DistalFull                                                        +---------+---------------+---------+-----------+----------+--------------+ PFV      Full                                                        +---------+---------------+---------+-----------+----------+--------------+ POP      Full  Yes      Yes                                 +---------+---------------+---------+-----------+----------+--------------+ PTV      Full                                                        +---------+---------------+---------+-----------+----------+--------------+ PERO                                                  Not visualized +---------+---------------+---------+-----------+----------+--------------+   +---------+---------------+---------+-----------+----------+--------------+ LEFT      CompressibilityPhasicitySpontaneityPropertiesThrombus Aging +---------+---------------+---------+-----------+----------+--------------+ CFV      Full           Yes      Yes                                 +---------+---------------+---------+-----------+----------+--------------+ SFJ      Full                                                        +---------+---------------+---------+-----------+----------+--------------+ FV Prox  Full                                                        +---------+---------------+---------+-----------+----------+--------------+ FV Mid   Full                                                        +---------+---------------+---------+-----------+----------+--------------+ FV DistalFull                                                        +---------+---------------+---------+-----------+----------+--------------+ PFV      Full                                                        +---------+---------------+---------+-----------+----------+--------------+ POP      Full           Yes      Yes                                 +---------+---------------+---------+-----------+----------+--------------+ PTV      Full                                                        +---------+---------------+---------+-----------+----------+--------------+  PERO                                                  Not visualized +---------+---------------+---------+-----------+----------+--------------+     Summary: Right: There is no evidence of deep vein thrombosis in the lower extremity. No cystic structure found in the popliteal fossa. Left: There is no evidence of deep vein thrombosis in the lower extremity. A cystic structure is found in the popliteal fossa.  *See table(s) above for measurements and observations. Electronically signed by Deitra Mayo MD on 01/23/2019 at 4:29:38 PM.    Final     Labs:  Basic Metabolic  Panel: Recent Labs  Lab 02/05/19 0642  NA 139  K 4.4  CL 102  CO2 29  GLUCOSE 83  BUN 13  CREATININE 0.63  CALCIUM 9.5    CBC: Recent Labs  Lab 02/05/19 0642  WBC 5.9  NEUTROABS 2.9  HGB 14.9  HCT 44.1  MCV 100.9*  PLT 340    CBG: No results for input(s): GLUCAP in the last 168 hours.  Family history.  Mother with CAD, renal failure diabetes mellitus and CAD.  Father with hypertension hyperlipidemia.  Sister with diabetes.  Brother with hypertension as well as seizure disorder.  Denies any colon or rectal cancer  Brief HPI:   Faith [name] is a 63 y.o. right-handed female with history of COPD, tobacco abuse, back pain, multiple sclerosis diagnosed 3 years ago.  Per chart review she lives with her children.  She was able to transfer as well as ambulate independently with some increased time for tasks prior to admission.  Presented 01/17/2019 with progressive weakness.  MRI of the brain stable since 2019.  Advanced chronic demyelinating disease with no progressive or active demyelination identified.  No new intracranial abnormality.  MRI cervical spine despite the advanced chronic demyelinating disease the symptomatic abnormality appears to be degenerative in nature.  Substantially progressed degenerative spondylolisthesis at C2-C3 and C3-C4 since 2019 with severe posterior element degeneration including a bulky right side synovial cyst at C3-C4.  Associated acute exacerbation of the chronic facet arthropathy at C2-3 and 3-4.  Underlying chronic cervical cord demyelinating disease appears stable without progression.  Patient initially seen by neurology services suspect multiple sclerosis exacerbation started on Solu-Medrol.  On 01/19/2019 patient reporting improved paresthesia continued to have weakness.  Neurosurgery consulted review of MRI cervical spine noting spinal cord compression synovial cyst and underwent posterior cervical laminectomy for synovial cyst resection 01/19/2019 per  Dr. Christella Noa.  Hospital course pain management.  No cervical brace required.  IV Solu-Medrol discontinued 01/20/2019 as MS exacerbation was ruled out.  Patient was admitted for a comprehensive rehab program.   Hospital Course: Faith [name] was admitted to rehab 01/22/2019 for inpatient therapies to consist of PT, ST and OT at least three hours five days a week. Past admission physiatrist, therapy team and rehab RN have worked together to provide customized collaborative inpatient rehab.  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  No cervical brace required.  She would follow-up neurosurgery.  SCDs for DVT prophylaxis venous Doppler studies negative and she was cleared to begin subcutaneous heparin for DVT prophylaxis.  Pain managed with use of scheduled baclofen as well as Celebrex.  Neurontin 600 mg 3 times daily as well as oxycodone for breakthrough pain as well as Ultram.  Neurogenic bowel bladder with bowel program modified.  In and out catheterizations every 6 hours as needed.  She was voiding constantly.  Noted history of tobacco abuse she received counts regards to cessation of nicotine products.   Blood pressures were monitored on TID basis and controlled  Faith [name] is continent of bowel and bladder.  Faith [name] has made gains during rehab stay and is attending therapies  Faith [name] will continue to receive follow up therapies   after discharge  Rehab course: During patient's stay in rehab weekly team conferences were held to monitor patient's progress, set goals and discuss barriers to discharge. At admission, patient required moderate assist stand pivot transfers, moderate assist ambulate 35 feet rolling walker, minimal assist sit to supine.  Max assist for grooming, max assist upper body bathing, max assist lower body bathing,  max assist upper body dressing, max assist lower body dressing, moderate assist toilet transfers  Physical exam.  Blood pressure 160/90 pulse 68 temperature 97.4 respirations 18 oxygen saturation 97% room air Constitutional.  Well-developed no distress HEENT Eyes.  Pupils round and reactive to light no discharge without nystagmus Neck.  Supple nontender no JVD without thyromegaly Cardiac.  Regular rate rhythm without any extra sounds or murmur or rub Respiratory effort normal no respiratory distress without wheezes GI.  Soft nontender positive bowel sounds without rebound Musculoskeletal.  Comments.  No edema Neurological.  Alert and oriented follows commands good awareness of deficits. Right upper extremity 4 out of 5 except 4- out of 5 in handgrip.  Right lower extremity 3 out of 5 hip flexors 4 out of 5 knee extension dorsiflexion plantarflexion.  Left upper extremity 4 out of 5 throughout left lower extremity 3 out of 5 hip flexors 4 out of 5 knee extension  /She  has had improvement in activity tolerance, balance, postural control as well as ability to compensate for deficits.  Faith [name] has had improvement in functional use RUE/LUE  and RLE/LLE as well as improvement in awareness.  Patient's focused on endurance and tolerance.  Ambulating 50 feet x 2 rolling walker supervision.  Decreased gait speed as well as decreased step length.  Patient perform sit to stand x2 furniture transfers ADL recliner stand pivot using rolling walker supervision modified independent.  Patient perform supine to sit with modified independent using bed rail, provided some cues for placement lower extremity.  Propels wheelchair supervision.  Patient ambulates to the bathroom rolling walker supervision for toileting toilet transfers.  Washes hands in standing at sink and groom seated.  Full family teaching completed plan discharge to home       Disposition: Discharge disposition: 01-Home or Self Care     Discharge  to home   Diet: Regular  Special Instructions: No driving smoking or alcohol  Medications at discharge. 1.  Tylenol as needed 2.  Baclofen 5 mg p.o. 3 times daily 3.  Celebrex 200 mg every 12 hours 4.  Vitamin D 2000 units twice daily 5.  Valium 5 mg every 6 as needed muscle spasms 6.  Neurontin 600 mg p.o. 3 times daily 7.  NicoDerm patch taper as directed 8.  Tramadol 100 mg every 6 hours as needed pain 9.  Inderal 60 mg p.o. twice daily  Discharge Instructions    Ambulatory referral to Physical Medicine Rehab   Complete by: As directed    Moderate complexity follow-up 1 to 2 weeks  cervical myelopathy      Follow-up Information    Lovorn, Jinny Blossom, MD Follow up.   Specialty: Physical Medicine and Rehabilitation Why: Office to call for appointment Contact information: [id number] [address] [place] [address] [phone]        Ashok Pall, MD Follow up.   Specialty: Neurosurgery Why: Call for appointment Contact information: [address] [place] [address] [phone]           Signed: Lavon Paganini [place]leigh 02/06/2019, 5:16 AM

## 2019-02-05 NOTE — Progress Notes (Signed)
Nutrition Follow-up  DOCUMENTATION CODES:   Underweight, Severe malnutrition in context of chronic illness  INTERVENTION:   - Radiation protection practitioner TID with meals, each supplement provides 240 kcal and 10 grams of protein  - Encourage adequate PO intake  NUTRITION DIAGNOSIS:   Severe Malnutrition related to chronic illness (MS, COPD) as evidenced by severe fat depletion, severe muscle depletion.  Ongoing, being addressed via oral nutrition supplements  GOAL:   Patient will meet greater than or equal to 90% of their needs  Progressing  MONITOR:   PO intake, Supplement acceptance, Weight trends, Skin  REASON FOR ASSESSMENT:   Other (underweight BMI)    ASSESSMENT:   63 year old female with PMH of COPD/tobacco abuse, back pain, multiple sclerosis diagnosed 3 years ago. Presented 01/17/19 with progressive weakness. MRI of the brain stable since 2019. Advanced chronic demyelinating disease with no progressive or active demyelination identified. MRI cervical spine despite the advanced chronic demyelinating disease the symptomatic abnormality appears to be degenerative in nature. 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 Associated acute exacerbation of the chronic facet arthropathy at C2-3 and 3-4. Pt initially seen by neurology services suspect multiple sclerosis exacerbation started on Solu-Medrol. On 01/19/19 patient patient reporting improvement paresthesia continued to have weakness. Neurosurgery consulted to review MRI cervical spine noting spinal cord compression/synovial cyst with noted cervical spondylolisthesis and pt underwent posterior cervical laminectomy for synovial cyst resection 01/19/19.  Noted plan for d/c on 12/08.  No new weights since 11/23.  Spoke with pt at bedside. Pt looking forward to d/c tomorrow.  Noted several packets of [employer] Instant Breakfast on bedside table. Pt reports she drinks the [employer] about ~50% of the time that she receives it on her tray. RD encouraged pt to continue eating well and taking supplements.  Meal Completion: 25-100% x last 8 meals (averaging 89%)  Medications reviewed and include: cholecalciferol  Labs reviewed.  Diet Order:   Diet Order            Diet regular Room service appropriate? Yes; Fluid consistency: Thin  Diet effective now              EDUCATION NEEDS:   Education needs have been addressed  Skin:  Skin Assessment: Skin Integrity Issues: Skin Integrity Issues: Incisions: neck  Last BM:  02/04/19  Height:   Ht Readings from Last 1 Encounters:  01/22/19 5\' 6"  (1.676 m)    Weight:   Wt Readings from Last 1 Encounters:  01/22/19 43.5 kg    Ideal Body Weight:  59.1 kg  BMI:  Body mass index is 15.46 kg/m.  Estimated Nutritional Needs:   Kcal:  1400-1600  Protein:  60-75 grams  Fluid:  >/= 1.4 L    Gaynell Face, MS, RD, LDN Inpatient Clinical Dietitian Pager: [phone] Weekend/After Hours: [phone]

## 2019-02-05 NOTE — Progress Notes (Signed)
[facility] PHYSICAL MEDICINE & REHABILITATION PROGRESS NOTE   Subjective/Complaints:  Pt reports she just got out of the shower- had a good weekend.  Thinks her spasticity is the same to a little better.   ROS- denies SOB, CP, N/V/D.    Objective:   No results found. Recent Labs    02/05/19 0642  WBC 5.9  HGB 14.9  HCT 44.1  PLT 340   Recent Labs    02/05/19 0642  NA 139  K 4.4  CL 102  CO2 29  GLUCOSE 83  BUN 13  CREATININE 0.63  CALCIUM 9.5    Intake/Output Summary (Last 24 hours) at 02/05/2019 [id number] Last data filed at 02/04/2019 1758 Gross per 24 hour  Intake 495 ml  Output -  Net 495 ml     Physical Exam: Vital Signs Blood pressure 115/77, pulse 60, temperature 97.6 F (36.4 C), resp. rate 16, height 5\' 6"  (1.676 m), weight 43.5 kg, SpO2 100 %. Vitals and labs reviewed- Gen- awake, alert, appropriate,sitting up in manual w/c just got out of shower and dressed with OT, NAD HEENT- conjugate gaze; no facial assymmetry CV- RRR Pulm- CTA B/L GI- soft, NT, ND;  (+)BS MS-  LUE- deltoid/bicep/tricep 4/5; WE/grip/finger abd 4-/5 RUE- Delt/bicep/tricep 4-/5 and WE/grip and finger abd 3+/5  - hands can make fists much better than last week  RLE HF 3-/5, KE 4-/5, DF/PF 4/5  LLE- HF 3/5, KE 4-/5, DF/PF 4/5 Sensation intact LT BUE and BLE  Neuro- MAS of  1+ in LEs spasticity-  MAS of 0 in UEs B/L Skin- no skin breakdown seen on exam currently- has anterior incision glued on anterior cervical neck- looks good- no drainage/no erythema and posterior incision as well- both glued - also has angry purple/yellow bruise on L shoulder/neck area that appears uncomfortable- is improving    Assessment/Plan: 1. Functional deficits secondary to cervical myelopathy which require 3+ hours per day of interdisciplinary therapy in a comprehensive inpatient rehab setting.  Physiatrist is providing close team supervision and 24 hour management of active medical problems listed  below.  Physiatrist and rehab team continue to assess barriers to discharge/monitor patient progress toward functional and medical goals  Care Tool:  Bathing    Body parts bathed by patient: Right arm, Left arm, Chest, Abdomen, Front perineal area, Buttocks, Right upper leg, Left upper leg, Face, Right lower leg, Left lower leg   Body parts bathed by helper: Right lower leg, Left lower leg     Bathing assist Assist Level: Supervision/Verbal cueing     Upper Body Dressing/Undressing Upper body dressing   What is the patient wearing?: Pull over shirt    Upper body assist Assist Level: Independent    Lower Body Dressing/Undressing Lower body dressing      What is the patient wearing?: Underwear/pull up, Pants     Lower body assist Assist for lower body dressing: Supervision/Verbal cueing     Toileting Toileting    Toileting assist Assist for toileting: Supervision/Verbal cueing     Transfers Chair/bed transfer  Transfers assist     Chair/bed transfer assist level: Supervision/Verbal cueing Chair/bed transfer assistive device: Programmer, multimedia   Ambulation assist      Assist level: Supervision/Verbal cueing Assistive device: Walker-rolling Max distance: 50'   Walk 10 feet activity   Assist     Assist level: Supervision/Verbal cueing Assistive device: Walker-rolling   Walk 50 feet activity   Assist Walk 50 feet with 2  turns activity did not occur: Safety/medical concerns(decreased strength/activity tolerance)  Assist level: Supervision/Verbal cueing Assistive device: Walker-rolling    Walk 150 feet activity   Assist Walk 150 feet activity did not occur: Safety/medical concerns(decreased strength/activity tolerance)         Walk 10 feet on uneven surface  activity   Assist Walk 10 feet on uneven surfaces activity did not occur: Safety/medical concerns(decreased strength/activity tolerance)          Wheelchair     Assist   Type of Wheelchair: Power    Wheelchair assist level: Supervision/Verbal cueing Max wheelchair distance: 150'    Wheelchair 50 feet with 2 turns activity    Assist        Assist Level: Supervision/Verbal cueing   Wheelchair 150 feet activity     Assist  Wheelchair 150 feet activity did not occur: Safety/medical concerns(decreased strength/activity tolerance)   Assist Level: Supervision/Verbal cueing   Blood pressure 115/77, pulse 60, temperature 97.6 F (36.4 C), resp. rate 16, height 5\' 6"  (1.676 m), weight 43.5 kg, SpO2 100 %.  Medical Problem List and Plan: 1.  Decrease in strength, mobility and coordination secondary to cervical synovial cyst, spinal cord compression, cervical spondylolisthesis with radiculopathy C3-4.S/P posterior cervical laminectomy for synovial cyst resection, posterior cervical arthrodesis with anterior instrumentation 01/19/2019.  No cervical brace required             -Cont rehab PT,  OT              -ELOS/Goals: Mod I PT and OT, I SLP 2.  Antithrombotics: -DVT/anticoagulation: SCDs.  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             -antiplatelet therapy: N/A 3. Pain Management: Celebrex 200 mg every 12 hours, Neurontin 600 mg 3 times daily, oxycodone/tramadol and Valium as needed  11/27- pain usually controlled except over L shoulder bruise- which is significant.   11/30: Ordered heating pad for bilateral shoulder pain--using sportscreme with great success 4. Mood: Provide emotional support             -antipsychotic agents: N/A 5. Neuropsych: This patient is capable of making decisions on her own behalf. 6. Skin/Wound Care: Routine skin checks 7. Fluids/Electrolytes/Nutrition: Routine in and outs with follow-up  chemistries. Labs stable on 11/30. 8.  Multiple sclerosis.  Diagnosed 3 years ago.  Neurology services.  Continue Inderal 60 mg twice daily. Cont  Baclofen 5mg  TID for spasticity.   11/24- will start Baclofen 5 mg TID for tone/spasms.   12/3- per pt, dose is good- wondering her static spasticity is same as dynamic spasticity- PT had question, but not here today. Will d/w PT tomorrow  12/4- spasticity is better today. 9.  Tobacco abuse.  NicoDerm patch 10.  Neurogenic bowel/ bladder.  Modified bowel program.  Check PVR  11/24- will change caths/bladder scans to q6 hours and cath if volumes >200cc; will d/w pt if needs bowel program due to MS/new incomplete cervical myelopathy.  11/25- needs to have BM- if doesn't today, please give suppository. Isn't getting cathed based on flowsheet documentation.   11/26- Had 3 BMs yesterday- no more constipation  11/29- says is more regular  11/30: with regular BM; had one this morning.  12/2- regular BMs  Voiding cont LBM today per pt not recorded  yet, states it was a nl BM  11. 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 Patient unfortunately HAS to have power w/c due to dual diagnoses.   12. Dispo  12/7- d/c tomorrow- will need f/u with Dr Dagoberto Ligas  LOS: 14 days A FACE TO FACE EVALUATION WAS PERFORMED  Karesa Maultsby 02/05/2019, 9:22 AM

## 2019-02-05 NOTE — Progress Notes (Signed)
Occupational Therapy Session Note  Patient Details  Name: MAILEEN MARKLEY MRN: [id number] Date of Birth: 08-04-1955  Today's Date: 02/05/2019 OT Individual Time: 1300-1355 OT Individual Time Calculation (min): 55 min    Short Term Goals: Week 2:  OT Short Term Goal 1 (Week 2): STG=LTG secondary to ELOS  Skilled Therapeutic Interventions/Progress Updates:    Pt resting in bed upon arrival.  Pt requested to use toilet and amb with RW for toileting before returning to wash hands at sink.  Pt amb with RW-supervision.  Pt leaned against sink to wash hands before returning to w/c and transition to gym.  OT intervention with focus on RUE FMC/gross motor tasks seated at table.  Tasks included removing beads from theraputty, 9 hole peg test (unable to complete with RUE, LUE-1:15), grasping medium size pegs and placing on peg board, and grasping and placing clothes pins on dowels. Pt returned to room and requested to return to bed.  Pt amb with RW to bed and returned to supine at supervision level.  Pt remained in bed with all needs within reach and bed alarm activated.  Therapy Documentation Precautions:  Precautions Precautions: Fall, Cervical Precaution Booklet Issued: Yes (comment) Precaution Comments: No brace Required Braces or Orthoses: (no brace needed per orders) Restrictions Weight Bearing Restrictions: No   Pain:  Pt c/o 4/10 pain in shoulders and neck; RN admin meds and repositioned   Therapy/Group: Individual Therapy  Leroy Libman 02/05/2019, 2:41 PM

## 2019-02-05 NOTE — Progress Notes (Signed)
Occupational Therapy Session Note  Patient Details  Name: Faith [name] MRN: [id number] Date of Birth: 02-15-1956  Today's Date: 02/05/2019 OT Individual Time: 0700-0810 OT Individual Time Calculation (min): 70 min    Short Term Goals: Week 2:  OT Short Term Goal 1 (Week 2): STG=LTG secondary to ELOS  Skilled Therapeutic Interventions/Progress Updates:    Pt resting in bed upon arrival and ready for therapy.  Pt requested to take a shower this morning.  Pt amb with RW to bathroom to use toilet prior to transferring to shower for bathing.  Pt completed shower and dressing with sit<>stand from seat.  Pt uses grab bars for standing balance for LB bathing and dressing tasks.  Pt completed all tasks at supervision level.  Pt stood at sink for grooming tasks. Pt requires more than a reasonable amount of time to complete tasks.  Reviewed home safety and energy conservation recommendation.  Pt verbalizes understanding of all recommendations.  Pt stated that a ramp was installed at home.  Pt pleased with progress and ready for discharge tomorrow.   Therapy Documentation Precautions:  Precautions Precautions: Fall, Cervical Precaution Booklet Issued: Yes (comment) Precaution Comments: reviewed log roll technique and all cervical precautions Required Braces or Orthoses: (no brace needed per orders) Restrictions Weight Bearing Restrictions: No Pain: "Not bad" at beginning of session; 4/5 at end of session; RN admin meds, repositioned  Therapy/Group: Individual Therapy  Leroy Libman 02/05/2019, 8:05 AM

## 2019-02-05 NOTE — Progress Notes (Signed)
Physical Therapy Discharge Summary  Patient Details  Name: Faith [name] MRN: [id number] Date of Birth: [birth year]  Today's Date: 02/05/2019 PT Individual Time: 0915-1030 PT Individual Time Calculation (min): 75 min    Patient has met 10 of 14 long term goals due to improved activity tolerance, improved balance, improved postural control, increased strength, increased range of motion, decreased pain, ability to compensate for deficits and improved coordination.  Patient to discharge at a wheelchair level and ambulating short household distances Supervision.   Patient's care partner is independent to provide the necessary physical assistance at discharge.  Reasons goals not met: Patient currently requires supervision for all standing mobility due to decreased balance. Patient has 24/7 supervision available at home and family is capable of providing this at d/c. She met all wheelchair mobility goals using a power chair and she will have a loner power chair available at discharge.   Recommendation:  Patient will benefit from ongoing skilled PT services in home health setting to continue to advance safe functional mobility, address ongoing impairments in balance, strength, ROM, functional mobility, w/c mobility, gait training, patient/caregiver education, and minimize fall risk.  Equipment: RW and power wheelchair  Reasons for discharge: treatment goals met  Patient/family agrees with progress made and goals achieved: Yes  Skilled Therapeutic Interventions:  Patient in recliner in room upon PT arrival. Patient alert and agreeable to PT session.  Therapeutic Activity: Bed Mobility: Patient performed rolling R and L and supine to/from sit with mod I using bed rails in a flat bed, patient has hospital bed at home.  Transfers: Patient performed sit to/from stand x3, stand pivot x2, and a simulated car transfer at [place] height with supervision. Patient requires supervision for safety without  cues from therapist for technique. Patient recalled correct hand position and reaching back to seat during each transfer. She performed a simulated truck transfer, as she will ride in a truck for community access/mobility with her power chair, with min A for physical support lifting herself onto the seat and bringing LEs into the vehicle. Patient has several family members that are capable of safely providing this type of assistance. Recommended asking to perform transfer into the truck with HHPT after d/c, patient in agreement.   Gait Training:  Patient ambulated 75 feet using RW with supervision for safety. Ambulated as described below. Provided verbal cues for increased BOS and increased B foot clearance.  Wheelchair Mobility:  Patient propelled a power wheelchair >200 feet and up/down a ramp with supervision for safety/cues for steering. Educated on having someone with her when using the power chair to assist with steering and avoiding objects as her cervical ROM is limited and UE dexterity is decreased, patient in agreement.  Neuromuscular Re-ed: Patient performed standing balance without UE 2x30 seconds and performed dynamic balance with reaching for cups at various heights and directions to simulate grabbing things out of kitchen cabinets with R/L single UE support and close supervision.    Patient in bed at end of session with breaks locked, bed alarm set, and all needs within reach. Discussed fall risk/prevention, home modifications, and activation of emergency response in the event of falls. Alos, eviewed HEP provided during session and confirmed ramp installation, put temporary ramp in yesterday during session.    PT Discharge Precautions/Restrictions Precautions Precautions: Fall;Cervical Precaution Comments: No brace Restrictions Weight Bearing Restrictions: No Vision/Perception  Vision - Assessment Eye Alignment: Within Functional Limits Ocular Range of Motion: Within Functional  Limits Alignment/Gaze Preference: Within Defined  Limits Tracking/Visual Pursuits: Decreased smoothness of eye movement to LEFT superior field;Decreased smoothness of eye movement to LEFT inferior field;Decreased smoothness of eye movement to RIGHT inferior field;Decreased smoothness of eye movement to RIGHT superior field;Decreased smoothness of horizontal tracking Saccades: Overshoots Convergence: Impaired (comment)(approximately 10 inches from bridge of nose before diploplia) Perception Perception: Within Functional Limits Praxis Praxis: Intact  Cognition Overall Cognitive Status: Within Functional Limits for tasks assessed Orientation Level: Oriented X4 Attention: Selective Selective Attention: Appears intact Memory: Appears intact Awareness: Appears intact Problem Solving: Appears intact Safety/Judgment: Appears intact Sensation Sensation Light Touch: Impaired Detail Light Touch Impaired Details: Impaired RUE;Impaired LUE(improved LE sensation since admission) Hot/Cold: Appears Intact Proprioception: Impaired by gross assessment Stereognosis: Not tested Coordination Gross Motor Movements are Fluid and Coordinated: No Fine Motor Movements are Fluid and Coordinated: No Coordination and Movement Description: generalized weakness distal>proximal in all extremities and decreased activity tolerance Finger Nose Finger Test: mild ataxia on R, delayed with L Heel Shin Test: unable do to decreased B LE strength Motor  Motor Motor: Other (comment) Motor - Skilled Clinical Observations: generalized weakness proximal>distal in all extremities and decreased activity tolerance  Mobility Bed Mobility Bed Mobility: Rolling Right;Rolling Left;Supine to Sit;Sit to Supine Rolling Right: Independent with assistive device Rolling Left: Independent with assistive device Supine to Sit: Independent with assistive device Sit to Supine: Independent with assistive device Transfers Transfers: Sit  to Stand;Stand to Sit;Stand Pivot Transfers Sit to Stand: Supervision/Verbal cueing Stand to Sit: Supervision/Verbal cueing Stand Pivot Transfers: Supervision/Verbal cueing Stand Pivot Transfer Details (indicate cue type and reason): Patient does not require cues with mobility, supervision required for safety due to decreased balance in standing. Transfer (Assistive device): Manufacturing systems engineer Ambulation: Yes Gait Assistance: Supervision/Verbal cueing Gait Distance (Feet): 68 Feet Assistive device: Rolling walker Gait Assistance Details: Verbal cues for technique;Verbal cues for precautions/safety Gait Gait: Yes Gait Pattern: Step-through pattern;Decreased step length - right;Decreased step length - left;Decreased hip/knee flexion - right;Decreased hip/knee flexion - left;Decreased dorsiflexion - right;Decreased dorsiflexion - left;Right genu recurvatum;Left genu recurvatum;Ataxic;Decreased trunk rotation;Narrow base of support;Trunk flexed Gait velocity: decreased Stairs / Additional Locomotion Stairs: Yes Stairs Assistance: Minimal Assistance - Patient > 75% Stair Management Technique: One rail Right Number of Stairs: 4 Height of Stairs: 6 Ramp: Supervision/Verbal cueing(in power chair) Product manager Mobility: Yes Wheelchair Assistance: Chartered loss adjuster: Power Wheelchair Parts Management: Needs assistance(foot plates) Distance: >200 feet  Trunk/Postural Assessment  Cervical Assessment Cervical Assessment: Exceptions to WFL(forward head) Thoracic Assessment Thoracic Assessment: Exceptions to WFL(rounded shoulders) Lumbar Assessment Lumbar Assessment: Exceptions to WFL(posterior pelvic tilt) Postural Control Postural Control: Deficits on evaluation(decreased/delayed)  Balance Static Sitting Balance Static Sitting - Balance Support: Feet supported Static Sitting - Level of Assistance: 7: Independent Dynamic Sitting  Balance Dynamic Sitting - Balance Support: During functional activity Dynamic Sitting - Level of Assistance: 5: Stand by assistance Static Standing Balance Static Standing - Level of Assistance: 7: Independent Dynamic Standing Balance Dynamic Standing - Balance Support: During functional activity Dynamic Standing - Level of Assistance: 5: Stand by assistance Extremity Assessment  RUE Assessment RUE Assessment: Exceptions to [facility] Active Range of Motion (AROM) Comments: WFL General Strength Comments: 4-/5 overall LUE Assessment LUE Assessment: Exceptions to [facility] Active Range of Motion (AROM) Comments: WFL General Strength Comments: 4/5 overall RLE Assessment RLE Assessment: Exceptions to [facility] Active Range of Motion (AROM) Comments: WFL for functional mobilty, tight hamstrings and heel cords General Strength Comments: Grossly in sitting: 4/5 throughout LLE Assessment LLE Assessment:  Exceptions to [facility] Active Range of Motion (AROM) Comments: WFL for functional mobilty, tight hamstrings and heel cords General Strength Comments: Grossly in sitting: 4+/5 throughout except 5/5 knee extensin and 3/5 DF    Dallie Patton L Destiny Hagin PT, DPT  02/05/2019, 3:46 PM

## 2019-02-05 NOTE — Progress Notes (Signed)
Occupational Therapy Discharge Summary  Patient Details  Name: Faith [name] MRN: [id number] Date of Birth: 02-May-1955  Patient has met 78 of 31 long term goals due to improved activity tolerance, improved balance, postural control, ability to compensate for deficits, functional use of  RIGHT upper and LEFT upper extremity and improved coordination.  Pt made steady progress with BADLs and functional transfers during this admission. Pt is supervision for bathing at shower level and dressing with sit<>stand from seat.  All transfers with supervision.  Pt verbalizes understanding of recommendation for supervision with ambulation and transfers in/out of shower.  Pt verbalizes understanding of energy conservation strategies and home safety recommendations. Patient to discharge at overall Supervision level.  Patient's care partner is independent to provide the necessary physical assistance at discharge.    Recommendation:  Patient will benefit from ongoing skilled OT services in home health setting to continue to advance functional skills in the area of BADL and Reduce care partner burden.  Equipment: No equipment provided  Reasons for discharge: treatment goals met and discharge from hospital  Patient/family agrees with progress made and goals achieved: Yes  OT Discharge   Vision Baseline Vision/History: Wears glasses Wears Glasses: Reading only Patient Visual Report: No change from baseline Vision Assessment?: Yes Eye Alignment: Within Functional Limits Ocular Range of Motion: Within Functional Limits Alignment/Gaze Preference: Within Defined Limits Tracking/Visual Pursuits: Decreased smoothness of eye movement to LEFT superior field;Decreased smoothness of eye movement to LEFT inferior field;Decreased smoothness of vertical tracking;Decreased smoothness of eye movement to RIGHT superior field;Decreased smoothness of eye movement to RIGHT inferior field;Decreased smoothness of horizontal  tracking Saccades: Within functional limits Convergence: Impaired (comment)(R eye does not track medially) Visual Fields: No apparent deficits Perception  Perception: Within Functional Limits Praxis Praxis: Intact Cognition Overall Cognitive Status: Within Functional Limits for tasks assessed Orientation Level: Oriented X4 Attention: Selective Selective Attention: Appears intact Memory: Appears intact Immediate Memory Recall: Sock;Blue;Bed Memory Recall Sock: Without Cue Memory Recall Blue: Without Cue Memory Recall Bed: Without Cue Awareness: Appears intact Problem Solving: Appears intact Safety/Judgment: Appears intact Sensation Sensation Light Touch Impaired Details: Impaired RUE;Impaired LUE;Impaired RLE;Impaired LLE Hot/Cold: Appears Intact Proprioception: Impaired by gross assessment Stereognosis: Not tested Coordination Gross Motor Movements are Fluid and Coordinated: No Fine Motor Movements are Fluid and Coordinated: No Coordination and Movement Description: generalized weakness distal>proximal in all extremities and decreased activity tolerance Finger Nose Finger Test: mild ataxia on R, delayed with L Motor  Motor Motor - Skilled Clinical Observations: generalized weakness proximal>distal in all extremities and decreased activity tolerance    Trunk/Postural Assessment  Cervical Assessment Cervical Assessment: (slight forward head) Thoracic Assessment Thoracic Assessment: (rounded shoulders) Lumbar Assessment Lumbar Assessment: (posterior pelvic tilt)  Balance Static Sitting Balance Static Sitting - Balance Support: Feet supported Static Sitting - Level of Assistance: 7: Independent Dynamic Sitting Balance Dynamic Sitting - Balance Support: During functional activity Dynamic Sitting - Level of Assistance: 5: Stand by assistance Extremity/Trunk Assessment RUE Assessment RUE Assessment: Exceptions to [facility] Active Range of Motion (AROM) Comments: WNL General  Strength Comments: 4-/5 overall LUE Assessment Active Range of Motion (AROM) Comments: WNL General Strength Comments: 4/5 overall   Leroy Libman 02/05/2019, 7:55 AM

## 2019-02-06 NOTE — Progress Notes (Signed)
Patient discharged to home, transportation provided by her son.

## 2019-02-06 NOTE — Progress Notes (Signed)
Social Work Discharge Note   The overall goal for the admission was met for:   Discharge location: Yes - home with daughter who can provide 24/7 support  Length of Stay: Yes - 15 days  Discharge activity level: Yes - supervision overall  Home/community participation: Yes  Services provided included: MD, RD, PT, OT, RN, TR, Pharmacy and [place]: Medicaid  Follow-up services arranged: Home Health: PT, OT via [facility] and Patient/Family has no preference for HH/DME agencies  Comments (or additional information):  Contact info:  Pt @ [phone]  Patient/Family verbalized understanding of follow-up arrangements: Yes  Individual responsible for coordination of the follow-up plan: pt  Confirmed correct DME delivered: Icesis Renn 02/06/2019    Shandrea Lusk

## 2019-02-06 NOTE — Discharge Instructions (Signed)
Inpatient Rehab Discharge Instructions  Faith [name] Discharge date and time: No discharge date for patient encounter.   Activities/Precautions/ Functional Status: Activity: As tolerated Diet: regular diet Wound Care: keep wound clean and dry Functional status:  ___ No restrictions     ___ Walk up steps independently ___ 24/7 supervision/assistance   ___ Walk up steps with assistance ___ Intermittent supervision/assistance  ___ Bathe/dress independently ___ Walk with walker     _x__ Bathe/dress with assistance ___ Walk Independently    ___ Shower independently ___ Walk with assistance    ___ Shower with assistance ___ No alcohol     ___ Return to work/school ________     COMMUNITY REFERRALS UPON DISCHARGE:    Home Health:   PT     OT                          Agency:  [facility]   Phone: [phone]   Medical Equipment/Items Ordered:  Wheelchair, cushion, rolling walker                                                      Agency/Supplier:  [facility] @ [phone]       Special Instructions: No driving smoking or alcohol   My questions have been answered and I understand these instructions. I will adhere to these goals and the provided educational materials after my discharge from the hospital.  Patient/Caregiver Signature _______________________________ Date __________  Clinician Signature _______________________________________ Date __________  Please bring this form and your medication list with you to all your follow-up doctor's appointments.

## 2019-02-06 NOTE — Progress Notes (Signed)
[facility] PHYSICAL MEDICINE & REHABILITATION PROGRESS NOTE   Subjective/Complaints:  Pt reports she's ready for d/c today- family to come pick her up- went over d/c time 10-12 usually.  ROS- denies SOB, CP, N/V/D.    Objective:   No results found. Recent Labs    02/05/19 0642  WBC 5.9  HGB 14.9  HCT 44.1  PLT 340   Recent Labs    02/05/19 0642  NA 139  K 4.4  CL 102  CO2 29  GLUCOSE 83  BUN 13  CREATININE 0.63  CALCIUM 9.5    Intake/Output Summary (Last 24 hours) at 02/06/2019 [id number] Last data filed at 02/05/2019 1817 Gross per 24 hour  Intake 700 ml  Output -  Net 700 ml     Physical Exam: Vital Signs Blood pressure 126/78, pulse 62, temperature (!) 97.5 F (36.4 C), resp. rate 16, height 5\' 6"  (1.676 m), weight 43.5 kg, SpO2 96 %. Vitals and labs reviewed- Gen- awake, alert, appropriate,sitting up in bed eating breakfast and watching TV, NAD HEENT- conjugate gaze; no facial assymmetry CV- RRR Pulm- CTA B/L GI- soft, NT, ND;  (+)BS MS-  LUE- deltoid/bicep/tricep 4/5; WE/grip/finger abd 4-/5 RUE- Delt/bicep/tricep 4-/5 and WE/grip and finger abd 3+/5  - hands can make fists much better than last week  RLE HF 3-/5, KE 4-/5, DF/PF 4/5  LLE- HF 3/5, KE 4-/5, DF/PF 4/5 Sensation intact LT BUE and BLE  Neuro- MAS of  1+ in LEs spasticity-  MAS of 0 in UEs B/L Skin- no skin breakdown seen on exam currently- has anterior incision glued on anterior cervical neck- looks good- no drainage/no erythema and posterior incision as well- both glued - also has angry purple/yellow bruise on L shoulder/neck area that appears uncomfortable- is improving    Assessment/Plan: 1. Functional deficits secondary to cervical myelopathy which require 3+ hours per day of interdisciplinary therapy in a comprehensive inpatient rehab setting.  Physiatrist is providing close team supervision and 24 hour management of active medical problems listed below.  Physiatrist and rehab team  continue to assess barriers to discharge/monitor patient progress toward functional and medical goals  Care Tool:  Bathing    Body parts bathed by patient: Right arm, Left arm, Chest, Abdomen, Front perineal area, Buttocks, Right upper leg, Left upper leg, Face, Right lower leg, Left lower leg   Body parts bathed by helper: Right lower leg, Left lower leg     Bathing assist Assist Level: Supervision/Verbal cueing     Upper Body Dressing/Undressing Upper body dressing   What is the patient wearing?: Pull over shirt    Upper body assist Assist Level: Independent    Lower Body Dressing/Undressing Lower body dressing      What is the patient wearing?: Underwear/pull up, Pants     Lower body assist Assist for lower body dressing: Supervision/Verbal cueing     Toileting Toileting    Toileting assist Assist for toileting: Supervision/Verbal cueing     Transfers Chair/bed transfer  Transfers assist     Chair/bed transfer assist level: Supervision/Verbal cueing Chair/bed transfer assistive device: Programmer, multimedia   Ambulation assist      Assist level: Supervision/Verbal cueing Assistive device: Walker-rolling Max distance: 81'   Walk 10 feet activity   Assist     Assist level: Supervision/Verbal cueing Assistive device: Walker-rolling   Walk 50 feet activity   Assist Walk 50 feet with 2 turns activity did not occur: Safety/medical concerns(decreased strength/activity tolerance)  Assist level: Supervision/Verbal cueing Assistive device: Walker-rolling    Walk 150 feet activity   Assist Walk 150 feet activity did not occur: Safety/medical concerns(decreased strength/activity tolerance)         Walk 10 feet on uneven surface  activity   Assist Walk 10 feet on uneven surfaces activity did not occur: Safety/medical concerns(decreased strength/activity tolerance)         Wheelchair     Assist Will patient use  wheelchair at discharge?: Yes Type of Wheelchair: Power    Wheelchair assist level: Supervision/Verbal cueing Max wheelchair distance: >200'    Wheelchair 50 feet with 2 turns activity    Assist        Assist Level: Supervision/Verbal cueing   Wheelchair 150 feet activity     Assist  Wheelchair 150 feet activity did not occur: Safety/medical concerns(decreased strength/activity tolerance)   Assist Level: Supervision/Verbal cueing   Blood pressure 126/78, pulse 62, temperature (!) 97.5 F (36.4 C), resp. rate 16, height 5\' 6"  (1.676 m), weight 43.5 kg, SpO2 96 %.  Medical Problem List and Plan: 1.  Decrease in strength, mobility and coordination secondary to cervical synovial cyst, spinal cord compression, cervical spondylolisthesis with radiculopathy C3-4.S/P posterior cervical laminectomy for synovial cyst resection, posterior cervical arthrodesis with anterior instrumentation 01/19/2019.  No cervical brace required             -Cont rehab PT,  OT              -ELOS/Goals: Mod I PT and OT, I SLP 2.  Antithrombotics: -DVT/anticoagulation: SCDs.  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             -antiplatelet therapy: N/A 3. Pain Management: Celebrex 200 mg every 12 hours, Neurontin 600 mg 3 times daily, oxycodone/tramadol and Valium as needed  11/27- pain usually controlled except over L shoulder bruise- which is significant.   11/30: Ordered heating pad for bilateral shoulder pain--using sportscreme with great success 4. Mood: Provide emotional support             -antipsychotic agents: N/A 5. Neuropsych: This patient is capable of making decisions on her own behalf. 6. Skin/Wound Care: Routine skin checks 7. Fluids/Electrolytes/Nutrition: Routine in and outs with follow-up  chemistries. Labs stable on 11/30. 8.  Multiple sclerosis.  Diagnosed 3 years ago.  Neurology services.  Continue Inderal 60 mg twice daily. Cont  Baclofen 5mg  TID for spasticity.   11/24- will start Baclofen 5 mg TID for tone/spasms.   12/3- per pt, dose is good- wondering her static spasticity is same as dynamic spasticity- PT had question, but not here today. Will d/w PT tomorrow  12/4- spasticity is better today. 9.  Tobacco abuse.  NicoDerm patch 10.  Neurogenic bowel/ bladder.  Modified bowel program.  Check PVR  11/24- will change caths/bladder scans to q6 hours and cath if volumes >200cc; will d/w pt if needs bowel program due to MS/new incomplete cervical myelopathy.  11/25- needs to have BM- if doesn't today, please give suppository. Isn't getting cathed based on flowsheet documentation.   11/26- Had 3 BMs yesterday- no more constipation  11/29- says is more regular  11/30: with regular BM; had one this morning.  12/2- regular BMs  Voiding cont LBM today per pt not recorded yet, states it was  a nl BM  11. 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 Patient unfortunately HAS to have power w/c due to dual diagnoses.   12. Dispo  12/8- d/c today-  will need f/u with Dr Dagoberto Ligas  LOS: 15 days A FACE TO FACE EVALUATION WAS PERFORMED  Jaishon Krisher 02/06/2019, 9:22 AM

## 2019-02-19 ENCOUNTER — Encounter
Payer: Medicaid Other | Attending: Physical Medicine and Rehabilitation | Admitting: Physical Medicine and Rehabilitation

## 2019-02-19 ENCOUNTER — Encounter: Payer: Self-pay | Admitting: Physical Medicine and Rehabilitation

## 2019-02-19 ENCOUNTER — Other Ambulatory Visit: Payer: Self-pay

## 2019-02-19 VITALS — BP 148/92 | HR 84 | Temp 97.8°F | Ht 64.0 in | Wt 95.0 lb

## 2019-02-19 DIAGNOSIS — G35 Multiple sclerosis: Secondary | ICD-10-CM | POA: Insufficient documentation

## 2019-02-19 DIAGNOSIS — M545 Low back pain, unspecified: Secondary | ICD-10-CM

## 2019-02-19 DIAGNOSIS — G252 Other specified forms of tremor: Secondary | ICD-10-CM | POA: Diagnosis not present

## 2019-02-19 DIAGNOSIS — G959 Disease of spinal cord, unspecified: Secondary | ICD-10-CM | POA: Diagnosis not present

## 2019-02-19 DIAGNOSIS — G8929 Other chronic pain: Secondary | ICD-10-CM | POA: Insufficient documentation

## 2019-02-19 MED ORDER — BACLOFEN 5 MG PO TABS: 10.0000 mg | ORAL_TABLET | Freq: Three times a day (TID) | ORAL | 11 refills | Status: DC

## 2019-02-19 NOTE — Progress Notes (Signed)
Subjective:    Patient ID: Faith [name], female    DOB: 15-May-1955, 63 y.o.   MRN: [id number]  HPI Pt is a 63 yr old female with hx of MSx 3 yrs as well as spinal cord compresison due to synovial cyst/spinal cord compression C3-4.S/P posterior cervical laminectomy for synovial cyst resection, posterior cervical arthrodesis with anterior instrumentation 01/19/2019. No cervical brace required- here for hospital f/u.  Going to the bathroom- squat pivot and pulls over to toilet-   Hasn't been using the push walker as often- when someone available so avoids in AM- because son sleeps in- afraid of falling. Had 1 fall since left hospital- didn't hurt anything. Didn't hit head or neck, etc.  No constipation- and urinating OK- sometimes cuts it a little close- has a [place] go situation.   Falls asleep so doesn't always take night meds- aims for taking at 10:30 to 11pm.   Daughter reports cannot gets dinner at night easily- dinner late sometimes.   Spasticity- pretty good- improved with baclofen.  Hands are moving WAY better- that was her worst problems.   Has tightness in neck and lower back that's the cause of her pain. Lower back is chronic.  Using loaner w/c- new w/c will take 6-10 weeks.  Doing well with it.  Asking if a nurse can come in for shower-  Is supervison to [place] for showers- since lives with SON- having difficulties because it's her son- and having difficulties washing hair and getting completely cleaned up.  Has BSC that goes over toilet- is using an old one that someone gave her- 5+ years ago- feet get caught up in it. Wondering if can get a new Bedside commode. That sits right overtop the commode.    Is taking Tramadol , but only taking some- usually takes 2 pills/day.    Pain Inventory Average Pain 4 Pain Right Now 4 My pain is aching  In the last 24 hours, has pain interfered with the following? General activity 4 Relation with others 0 Enjoyment of life  4 What TIME of day is your pain at its worst? daytime Sleep (in general) Fair  Pain is worse with: inactivity and some activites Pain improves with: rest, pacing activities and medication Relief from Meds: 8  Mobility walk with assistance use a walker ability to climb steps?  yes use a wheelchair needs help with transfers  Function retired I need assistance with the following:  feeding, dressing, bathing, toileting, meal prep, household duties and shopping  Neuro/Psych bladder control problems weakness numbness tremor trouble walking spasms dizziness  Prior Studies Any changes since last visit?  no  Physicians involved in your care Any changes since last visit?  no   Family History  Problem Relation Age of Onset  . Heart attack Mother   . Kidney failure Mother   . Diabetes Mother   . Heart disease Mother   . Hyperlipidemia Mother   . Hypertension Mother   . Heart attack Father 63  . Hypertension Father   . Hypertension Sister   . Diabetes Sister   . Other Brother        house fire  . Hypertension Brother   . Seizures Brother   . Heart disease Brother        mitral valve  . Arthritis Daughter        DJD, AS fibromyalgia  . Polymyositis Daughter   . Cancer Maternal Grandfather        lung  .  Heart disease Maternal Grandfather   . Other Paternal Grandmother        house fire  . Alcohol abuse Son    Social History   Socioeconomic History  . Marital status: Legally Separated    Spouse name: Not on file  . Number of children: 3  . Years of education: 47  . Highest education level: Not on file  Occupational History  . Occupation: disabled    Comment: house painting  Tobacco Use  . Smoking status: Current Every Day Smoker    Packs/day: 0.25    Years: 45.00    Pack years: 11.25  . Smokeless tobacco: Never Used  . Tobacco comment: cutting down  Substance and Sexual Activity  . Alcohol use: Yes    Alcohol/week: 2.0 standard drinks    Types: 2  Cans of beer per week  . Drug use: No  . Sexual activity: Not Currently    Birth control/protection: Post-menopausal  Other Topics Concern  . Not on file  Social History Narrative   Patient lives with her son. [name], age 86, frequently visits   Social Determinants of Health   Financial Resource Strain:   . Difficulty of Paying Living Expenses: Not on file  Food Insecurity:   . Worried About Charity fundraiser in the Last Year: Not on file  . Ran Out of Food in the Last Year: Not on file  Transportation Needs:   . Lack of Transportation (Medical): Not on file  . Lack of Transportation (Non-Medical): Not on file  Physical Activity:   . Days of Exercise per Week: Not on file  . Minutes of Exercise per Session: Not on file  Stress:   . Feeling of Stress : Not on file  Social Connections:   . Frequency of Communication with Friends and Family: Not on file  . Frequency of Social Gatherings with Friends and Family: Not on file  . Attends Religious Services: Not on file  . Active Member of Clubs or Organizations: Not on file  . Attends Archivist Meetings: Not on file  . Marital Status: Not on file   Past Surgical History:  Procedure Laterality Date  . ANTERIOR CERVICAL DECOMP/DISCECTOMY FUSION N/A 01/19/2019   Procedure: ANTERIOR CERVICAL DECOMPRESSION/DISCECTOMY Cervical three - four;  Surgeon: Ashok Pall, MD;  Location: [facility];  Service: Neurosurgery;  Laterality: N/A;  . EYE SURGERY Right   . ORIF FACIAL FRACTURE    . POSTERIOR CERVICAL LAMINECTOMY N/A 01/19/2019   Procedure: POSTERIOR CERVICAL LAMINECTOMY Removal of Synovial Cyst and posterior cervical fusion;  Surgeon: Ashok Pall, MD;  Location: [facility];  Service: Neurosurgery;  Laterality: N/A;  . TUBAL LIGATION    . tubaligation     Past Medical History:  Diagnosis Date  . Anemia    my whole life, Used to take iron  . Arthritis    spine, lumbar  . COPD (chronic obstructive pulmonary disease)  ([place])   . Emphysema of lung ([place])   . Narcolepsy    by history, has nightmares  . Neuromuscular disorder ([place])    leg issues from spine   BP (!) 148/92   Pulse 84   Temp 97.8 F (36.6 C)   Ht 5\' 4"  (1.626 m)   Wt 95 lb (43.1 kg)   SpO2 90%   BMI 16.31 kg/m   Opioid Risk Score:   Fall Risk Score:  `1  Depression screen PHQ 2/9  Depression screen [facility] 2/9 12/14/2016 09/07/2016  06/08/2016 01/14/2016  Decreased Interest 0 0 0 0  Down, Depressed, Hopeless 0 0 0 0  PHQ - 2 Score 0 0 0 0     Review of Systems  Constitutional: Positive for appetite change, chills, fever and unexpected weight change.  HENT: Negative.   Eyes: Negative.   Respiratory: Negative.   Cardiovascular: Negative.   Gastrointestinal: Negative.   Endocrine: Negative.   Genitourinary: Positive for difficulty urinating.  Musculoskeletal: Positive for gait problem and myalgias.  Skin: Negative.   Allergic/Immunologic: Negative.   Neurological: Positive for dizziness, tremors, weakness and numbness.  Hematological: Negative.   Psychiatric/Behavioral: Negative.   All other systems reviewed and are negative.      Objective:   Physical Exam Awake, alert, appropriate, accompanied by daughter, in transfer w/c, NAD MS:  UEs- biceps 5-/5, WE 4+/5, triceps 4/5, grip 4/5, finger abd 3+/5  LEs-  HF 2+/5, KE 4-/5 on L and 3+/5 on R; DF 3/5 and PF 4-/5, and EHL 2+/5  Neuro: MAS 1 to 1+ in UEs; no hoffman's on L; (+) of R MAS 1+ in LEs hips/knees 1 in ankles       Assessment & Plan:   Pt is a 63 yr old female with hx of MSx 3 yrs as well as spinal cord compresison due to synovial cyst/spinal cord compression C3-4.S/P posterior cervical laminectomy for synovial cyst resection, posterior cervical arthrodesis with anterior instrumentation 01/19/2019. No cervical brace required- here for hospital f/u.  1. Suggest moving night meds to dinner time so can not miss medicines.  2. Discussed ways to make sure  getting dinner- frozen meals, slow cooker, hamburger helper,  Just make sure has the food groups in it- that's all.  3. Pt was at supervision level when left [facility], however needs assistance to wash hair, get truly clean- (is at w/c level)- since has Medicaid, seeing if possible to get Home Health aide- to help with bathing only.- will discuss with SW- Lucy. Of note, pt has MS and is variable with her level of function based on level of fatigue.  4. Can get new Bedside commode- has an old one- that got from someone- asking for a smaller version because her legs gets caught up in it. Will speak with SW Lucy.  5. Will refill Baclofen 10 mg TID #90- covered for 1 year if don't change dose.  6. Pt says got Diazepam, which is new- prescribed prior to came to Rehab- and Propranolol and Tramadol from Neurology- can get refills from them. Wondering how much tremors are spasms???  7. Diazepam can be prescribed by myself or by Neurology- can even eat better now because of reduced tremors since on Diazepam (which was prescribed for spasms)  8. Con't current dose of Baclofen  9. Will leave PCP and Neurology to con't her other meds- can prescribe Diazepam if no one feels comfortable.   47. Hasn't gotten handicapped placard, will give her one for signature- to take ot DMV.  11. F/U in 8 weeks  I spent 45 minutes on appointment- more than 30 minutes discussing plan and educating on spasticity, tremors, spasms, and tightness.-

## 2019-02-19 NOTE — Patient Instructions (Signed)
Pt is a 63 yr old female with hx of MSx 3 yrs as well as spinal cord compresison due to synovial cyst/spinal cord compression C3-4.S/P posterior cervical laminectomy for synovial cyst resection, posterior cervical arthrodesis with anterior instrumentation 01/19/2019. No cervical brace required- here for hospital f/u.  1. Suggest moving night meds to dinner time so can not miss medicines.  2. Discussed ways to make sure getting dinner- frozen meals, slow cooker, hamburger helper,  Just make sure has the food groups in it- that's all.  3. Pt was at supervision level when left [facility], however needs assistance to wash hair, get truly clean- (is at w/c level)- since has Medicaid, seeing if possible to get Home Health aide- to help with bathing only.- will discuss with SW- Lucy. Of note, pt has MS and is variable with her level of function based on level of fatigue.  4. Can get new Bedside commode- has an old one- that got from someone- asking for a smaller version because her legs gets caught up in it. Will speak with SW Lucy.  5. Will refill Baclofen 10 mg TID #90- covered for 1 year if don't change dose.  6. Pt says got Diazepam, which is new- prescribed prior to came to Rehab- and Propranolol and Tramadol from Neurology- can get refills from them. Wondering how much tremors are spasms???  7. Diazepam can be prescribed by myself or by Neurology- can even eat better now because of reduced tremors since on Diazepam (which was prescribed for spasms)  8. Con't current dose of Baclofen  9. Will leave PCP and Neurology to con't her other meds- can prescribe Diazepam if no one feels comfortable.   20. Hasn't gotten handicapped placard, will give her one for signature- to take ot DMV.  11. F/U in 8 weeks

## 2019-02-28 ENCOUNTER — Encounter (INDEPENDENT_AMBULATORY_CARE_PROVIDER_SITE_OTHER): Payer: Self-pay | Admitting: Internal Medicine

## 2019-02-28 ENCOUNTER — Ambulatory Visit (INDEPENDENT_AMBULATORY_CARE_PROVIDER_SITE_OTHER): Payer: Medicaid Other | Admitting: Internal Medicine

## 2019-02-28 ENCOUNTER — Telehealth (INDEPENDENT_AMBULATORY_CARE_PROVIDER_SITE_OTHER): Payer: Self-pay

## 2019-02-28 VITALS — BP 139/82 | Resp 18 | Ht 64.0 in | Wt 95.0 lb

## 2019-02-28 DIAGNOSIS — M4726 Other spondylosis with radiculopathy, lumbar region: Secondary | ICD-10-CM | POA: Diagnosis not present

## 2019-02-28 DIAGNOSIS — G35 Multiple sclerosis: Secondary | ICD-10-CM | POA: Diagnosis not present

## 2019-02-28 DIAGNOSIS — R296 Repeated falls: Secondary | ICD-10-CM

## 2019-02-28 NOTE — Progress Notes (Signed)
Metrics: Intervention Frequency ACO  Documented Smoking Status Yearly  Screened one or more times in 24 months  Cessation Counseling or  Active cessation medication Past 24 months  Past 24 months   Guideline developer: UpToDate (See UpToDate for funding source) Date Released: 2014       Wellness Office Visit  Subjective:  Patient ID: Faith [name], female    DOB: 1955-06-16  Age: 63 y.o. MRN: [id number]  CC: This is an audio telemedicine visit with the patient who is at home and I am in my office.  She is agreeable to the visit.  I was able to recognize her voice easily. This is a follow-up visit from recent hospitalization where she underwent neck surgery. HPI  It was discovered that she had spinal cord compression and underwent posterior cervical laminectomy and synovial cyst resection on January 19, 2019.  Postoperatively, she had physical rehabilitation and has done well.  Initially it was felt that her overall symptoms may have been multiple sclerosis exacerbation but this was ruled out.  She was admitted to rehabilitation on January 22, 2019 and therapies consisted of physical therapy, speech therapy, occupational therapy at least 3 hours 5 days a week. She is now home and seems to be doing well but she requires front wheels on her walker and bathtub railing. Her medications were reviewed today. Past Medical History:  Diagnosis Date  . Anemia    my whole life, Used to take iron  . Arthritis    spine, lumbar  . COPD (chronic obstructive pulmonary disease) ([place])   . Emphysema of lung ([place])   . Narcolepsy    by history, has nightmares  . Neuromuscular disorder ([place])    leg issues from spine      Family History  Problem Relation Age of Onset  . Heart attack Mother   . Kidney failure Mother   . Diabetes Mother   . Heart disease Mother   . Hyperlipidemia Mother   . Hypertension Mother   . Heart attack Father 46  . Hypertension Father   . Hypertension Sister   .  Diabetes Sister   . Other Brother        house fire  . Hypertension Brother   . Seizures Brother   . Heart disease Brother        mitral valve  . Arthritis Daughter        DJD, AS fibromyalgia  . Polymyositis Daughter   . Cancer Maternal Grandfather        lung  . Heart disease Maternal Grandfather   . Other Paternal Grandmother        house fire  . Alcohol abuse Son     Social History   Social History Narrative   Patient lives with her son. [name], age 54, frequently visits   Social History   Tobacco Use  . Smoking status: Current Every Day Smoker    Packs/day: 0.25    Years: 45.00    Pack years: 11.25  . Smokeless tobacco: Never Used  . Tobacco comment: cutting down  Substance Use Topics  . Alcohol use: Yes    Alcohol/week: 2.0 standard drinks    Types: 2 Cans of beer per week    Current Meds  Medication Sig  . Baclofen 5 MG TABS Take 10 mg by mouth 3 (three) times daily. For spasticity  . celecoxib (CELEBREX) 200 MG capsule Take 1 capsule (200 mg total) by mouth every 12 (twelve) hours.  Marland Kitchen  Cholecalciferol (VITAMIN D) 50 MCG (2000 UT) CAPS Take 1 capsule (2,000 Units total) by mouth 2 (two) times daily. (Patient taking differently: Take 2,000 Units by mouth daily. )  . diazepam (VALIUM) 5 MG tablet Take 1 tablet (5 mg total) by mouth every 6 (six) hours as needed for muscle spasms.  Marland Kitchen gabapentin (NEURONTIN) 300 MG capsule Take 2 capsules (600 mg total) by mouth 3 (three) times daily.  . propranolol (INDERAL) 60 MG tablet Take 1 tablet (60 mg total) by mouth 2 (two) times daily.  . traMADol (ULTRAM) 50 MG tablet Take 2 tablets (100 mg total) by mouth every 6 (six) hours as needed for moderate pain.  . [DISCONTINUED] baclofen (LIORESAL) 10 MG tablet Take 10 mg by mouth 3 (three) times daily.     Objective:   Today's Vitals: BP 139/82 Comment: Occp Teh taken today.  Resp 18   Ht 5\' 4"  (1.626 m)   Wt 95 lb (43.1 kg)   BMI 16.31 kg/m  Vitals with BMI  02/28/2019 02/19/2019 02/06/2019  Height 5\' 4"  5\' 4"  -  Weight 95 lbs 95 lbs -  BMI 123XX123 123XX123 -  Systolic [id number]  Diastolic 82 92 78  Pulse - 84 62     Physical Exam   Her speech on the phone is appropriate and she seems to be alert and orientated.  Vital signs recorded above were taken earlier by the physical therapist.    Assessment   1. Osteoarthritis of spine with radiculopathy, lumbar region   2. Falls frequently   3. Multiple sclerosis ([place])       Tests ordered Orders Placed This Encounter  Procedures  . For home use only DME Other see comment     Plan: 1. She will continue with all medications and we will send an order to [facility] for the durable medical equipment that she requires. 2. I will see her for follow-up in the office in about 4 months time, after she has had a follow-up appointment with neurosurgery. 3. This phone call lasted 7 minutes and 15 seconds.   No orders of the defined types were placed in this encounter.   Doree Albee, MD

## 2019-02-28 NOTE — Telephone Encounter (Signed)
Order for DME OT supplies sent to [facility] today.

## 2019-03-01 ENCOUNTER — Other Ambulatory Visit (INDEPENDENT_AMBULATORY_CARE_PROVIDER_SITE_OTHER): Payer: Self-pay

## 2019-03-01 DIAGNOSIS — Z719 Counseling, unspecified: Secondary | ICD-10-CM

## 2019-03-01 NOTE — Progress Notes (Signed)
Mallory from [facility] called for a verbal order for social work to consult.

## 2019-03-06 DIAGNOSIS — G8252 Quadriplegia, C1-C4 incomplete: Secondary | ICD-10-CM | POA: Diagnosis not present

## 2019-03-06 DIAGNOSIS — G35 Multiple sclerosis: Secondary | ICD-10-CM | POA: Diagnosis not present

## 2019-03-07 DIAGNOSIS — I700 Atherosclerosis of aorta: Secondary | ICD-10-CM

## 2019-03-07 DIAGNOSIS — Z4789 Encounter for other orthopedic aftercare: Secondary | ICD-10-CM

## 2019-03-07 DIAGNOSIS — M47816 Spondylosis without myelopathy or radiculopathy, lumbar region: Secondary | ICD-10-CM

## 2019-03-07 DIAGNOSIS — D649 Anemia, unspecified: Secondary | ICD-10-CM

## 2019-03-07 DIAGNOSIS — G252 Other specified forms of tremor: Secondary | ICD-10-CM

## 2019-03-07 DIAGNOSIS — G47419 Narcolepsy without cataplexy: Secondary | ICD-10-CM

## 2019-03-07 DIAGNOSIS — M5412 Radiculopathy, cervical region: Secondary | ICD-10-CM

## 2019-03-07 DIAGNOSIS — E559 Vitamin D deficiency, unspecified: Secondary | ICD-10-CM

## 2019-03-07 DIAGNOSIS — Z9181 History of falling: Secondary | ICD-10-CM

## 2019-03-07 DIAGNOSIS — J438 Other emphysema: Secondary | ICD-10-CM

## 2019-03-07 DIAGNOSIS — G35 Multiple sclerosis: Secondary | ICD-10-CM

## 2019-03-07 DIAGNOSIS — F17200 Nicotine dependence, unspecified, uncomplicated: Secondary | ICD-10-CM

## 2019-03-07 DIAGNOSIS — G959 Disease of spinal cord, unspecified: Secondary | ICD-10-CM

## 2019-03-07 DIAGNOSIS — Z981 Arthrodesis status: Secondary | ICD-10-CM

## 2019-03-07 DIAGNOSIS — M4312 Spondylolisthesis, cervical region: Secondary | ICD-10-CM | POA: Diagnosis not present

## 2019-03-08 DIAGNOSIS — Z4789 Encounter for other orthopedic aftercare: Secondary | ICD-10-CM | POA: Diagnosis not present

## 2019-03-09 DIAGNOSIS — Z4789 Encounter for other orthopedic aftercare: Secondary | ICD-10-CM | POA: Diagnosis not present

## 2019-03-12 DIAGNOSIS — Z4789 Encounter for other orthopedic aftercare: Secondary | ICD-10-CM | POA: Diagnosis not present

## 2019-03-13 DIAGNOSIS — Z4789 Encounter for other orthopedic aftercare: Secondary | ICD-10-CM | POA: Diagnosis not present

## 2019-03-14 DIAGNOSIS — Z4789 Encounter for other orthopedic aftercare: Secondary | ICD-10-CM | POA: Diagnosis not present

## 2019-03-15 DIAGNOSIS — Z4789 Encounter for other orthopedic aftercare: Secondary | ICD-10-CM | POA: Diagnosis not present

## 2019-04-02 DIAGNOSIS — G35 Multiple sclerosis: Secondary | ICD-10-CM | POA: Diagnosis not present

## 2019-04-04 DIAGNOSIS — G35 Multiple sclerosis: Secondary | ICD-10-CM | POA: Diagnosis not present

## 2019-04-06 DIAGNOSIS — G35 Multiple sclerosis: Secondary | ICD-10-CM | POA: Diagnosis not present

## 2019-04-09 DIAGNOSIS — G35 Multiple sclerosis: Secondary | ICD-10-CM | POA: Diagnosis not present

## 2019-04-11 DIAGNOSIS — G35 Multiple sclerosis: Secondary | ICD-10-CM | POA: Diagnosis not present

## 2019-04-12 ENCOUNTER — Other Ambulatory Visit: Payer: Self-pay | Admitting: Physical Medicine and Rehabilitation

## 2019-04-13 ENCOUNTER — Encounter: Payer: Medicaid Other | Admitting: Physical Medicine and Rehabilitation

## 2019-04-13 DIAGNOSIS — G35 Multiple sclerosis: Secondary | ICD-10-CM | POA: Diagnosis not present

## 2019-04-13 NOTE — Telephone Encounter (Signed)
Due to recent change in pharmacy policy, this medication can no longer be called into pharmacy using phone and now must be escribed.

## 2019-04-16 DIAGNOSIS — G35 Multiple sclerosis: Secondary | ICD-10-CM | POA: Diagnosis not present

## 2019-04-16 NOTE — Telephone Encounter (Signed)
Last Rx until seen in clinic.

## 2019-04-17 ENCOUNTER — Encounter (INDEPENDENT_AMBULATORY_CARE_PROVIDER_SITE_OTHER): Payer: Self-pay | Admitting: Nurse Practitioner

## 2019-04-17 NOTE — Telephone Encounter (Signed)
This encounter was created in error - please disregard.

## 2019-04-18 DIAGNOSIS — G35 Multiple sclerosis: Secondary | ICD-10-CM | POA: Diagnosis not present

## 2019-04-20 DIAGNOSIS — G35 Multiple sclerosis: Secondary | ICD-10-CM | POA: Diagnosis not present

## 2019-04-23 DIAGNOSIS — G35 Multiple sclerosis: Secondary | ICD-10-CM | POA: Diagnosis not present

## 2019-04-25 DIAGNOSIS — G35 Multiple sclerosis: Secondary | ICD-10-CM | POA: Diagnosis not present

## 2019-04-27 DIAGNOSIS — G35 Multiple sclerosis: Secondary | ICD-10-CM | POA: Diagnosis not present

## 2019-04-30 DIAGNOSIS — G35 Multiple sclerosis: Secondary | ICD-10-CM | POA: Diagnosis not present

## 2019-05-02 DIAGNOSIS — G35 Multiple sclerosis: Secondary | ICD-10-CM | POA: Diagnosis not present

## 2019-05-04 DIAGNOSIS — G35 Multiple sclerosis: Secondary | ICD-10-CM | POA: Diagnosis not present

## 2019-05-07 DIAGNOSIS — G35 Multiple sclerosis: Secondary | ICD-10-CM | POA: Diagnosis not present

## 2019-05-09 DIAGNOSIS — G35 Multiple sclerosis: Secondary | ICD-10-CM | POA: Diagnosis not present

## 2019-05-11 DIAGNOSIS — G35 Multiple sclerosis: Secondary | ICD-10-CM | POA: Diagnosis not present

## 2019-05-14 DIAGNOSIS — G35 Multiple sclerosis: Secondary | ICD-10-CM | POA: Diagnosis not present

## 2019-05-16 DIAGNOSIS — G35 Multiple sclerosis: Secondary | ICD-10-CM | POA: Diagnosis not present

## 2019-05-18 DIAGNOSIS — G35 Multiple sclerosis: Secondary | ICD-10-CM | POA: Diagnosis not present

## 2019-05-21 DIAGNOSIS — Z79899 Other long term (current) drug therapy: Secondary | ICD-10-CM | POA: Diagnosis not present

## 2019-05-21 DIAGNOSIS — G35 Multiple sclerosis: Secondary | ICD-10-CM | POA: Diagnosis not present

## 2019-05-21 DIAGNOSIS — G894 Chronic pain syndrome: Secondary | ICD-10-CM | POA: Diagnosis not present

## 2019-05-21 DIAGNOSIS — R261 Paralytic gait: Secondary | ICD-10-CM | POA: Diagnosis not present

## 2019-05-21 DIAGNOSIS — G250 Essential tremor: Secondary | ICD-10-CM | POA: Diagnosis not present

## 2019-05-23 DIAGNOSIS — G35 Multiple sclerosis: Secondary | ICD-10-CM | POA: Diagnosis not present

## 2019-05-25 DIAGNOSIS — G35 Multiple sclerosis: Secondary | ICD-10-CM | POA: Diagnosis not present

## 2019-05-29 ENCOUNTER — Other Ambulatory Visit (INDEPENDENT_AMBULATORY_CARE_PROVIDER_SITE_OTHER): Payer: Self-pay | Admitting: Nurse Practitioner

## 2019-05-29 ENCOUNTER — Other Ambulatory Visit: Payer: Self-pay | Admitting: Physical Medicine and Rehabilitation

## 2019-05-30 DIAGNOSIS — G35 Multiple sclerosis: Secondary | ICD-10-CM | POA: Diagnosis not present

## 2019-05-31 DIAGNOSIS — G35 Multiple sclerosis: Secondary | ICD-10-CM | POA: Diagnosis not present

## 2019-06-01 DIAGNOSIS — G35 Multiple sclerosis: Secondary | ICD-10-CM | POA: Diagnosis not present

## 2019-06-04 DIAGNOSIS — G35 Multiple sclerosis: Secondary | ICD-10-CM | POA: Diagnosis not present

## 2019-06-06 DIAGNOSIS — G35 Multiple sclerosis: Secondary | ICD-10-CM | POA: Diagnosis not present

## 2019-06-08 DIAGNOSIS — G35 Multiple sclerosis: Secondary | ICD-10-CM | POA: Diagnosis not present

## 2019-06-11 DIAGNOSIS — G35 Multiple sclerosis: Secondary | ICD-10-CM | POA: Diagnosis not present

## 2019-06-13 DIAGNOSIS — G35 Multiple sclerosis: Secondary | ICD-10-CM | POA: Diagnosis not present

## 2019-06-15 DIAGNOSIS — G35 Multiple sclerosis: Secondary | ICD-10-CM | POA: Diagnosis not present

## 2019-06-18 DIAGNOSIS — G35 Multiple sclerosis: Secondary | ICD-10-CM | POA: Diagnosis not present

## 2019-06-20 DIAGNOSIS — G35 Multiple sclerosis: Secondary | ICD-10-CM | POA: Diagnosis not present

## 2019-06-22 DIAGNOSIS — G35 Multiple sclerosis: Secondary | ICD-10-CM | POA: Diagnosis not present

## 2019-06-25 DIAGNOSIS — G35 Multiple sclerosis: Secondary | ICD-10-CM | POA: Diagnosis not present

## 2019-06-27 DIAGNOSIS — G35 Multiple sclerosis: Secondary | ICD-10-CM | POA: Diagnosis not present

## 2019-06-28 ENCOUNTER — Ambulatory Visit (INDEPENDENT_AMBULATORY_CARE_PROVIDER_SITE_OTHER): Payer: Medicaid Other | Admitting: Internal Medicine

## 2019-06-29 DIAGNOSIS — G35 Multiple sclerosis: Secondary | ICD-10-CM | POA: Diagnosis not present

## 2019-07-02 DIAGNOSIS — G35 Multiple sclerosis: Secondary | ICD-10-CM | POA: Diagnosis not present

## 2019-07-03 ENCOUNTER — Other Ambulatory Visit: Payer: Self-pay

## 2019-07-03 ENCOUNTER — Telehealth (INDEPENDENT_AMBULATORY_CARE_PROVIDER_SITE_OTHER): Payer: Self-pay

## 2019-07-03 ENCOUNTER — Ambulatory Visit (INDEPENDENT_AMBULATORY_CARE_PROVIDER_SITE_OTHER): Payer: Medicaid Other | Admitting: Internal Medicine

## 2019-07-03 ENCOUNTER — Encounter (INDEPENDENT_AMBULATORY_CARE_PROVIDER_SITE_OTHER): Payer: Self-pay | Admitting: Internal Medicine

## 2019-07-03 VITALS — BP 130/72 | HR 88 | Temp 97.7°F | Resp 18 | Ht 66.0 in | Wt 101.4 lb

## 2019-07-03 DIAGNOSIS — R251 Tremor, unspecified: Secondary | ICD-10-CM

## 2019-07-03 DIAGNOSIS — G35 Multiple sclerosis: Secondary | ICD-10-CM

## 2019-07-03 DIAGNOSIS — M4726 Other spondylosis with radiculopathy, lumbar region: Secondary | ICD-10-CM

## 2019-07-03 MED ORDER — TRAMADOL HCL 50 MG PO TABS: 100.0000 mg | ORAL_TABLET | Freq: Four times a day (QID) | ORAL | 3 refills | Status: DC | PRN

## 2019-07-03 MED ORDER — CELECOXIB 200 MG PO CAPS: 200.0000 mg | ORAL_CAPSULE | Freq: Two times a day (BID) | ORAL | 1 refills | Status: DC

## 2019-07-03 NOTE — Progress Notes (Signed)
Metrics: Intervention Frequency ACO  Documented Smoking Status Yearly  Screened one or more times in 24 months  Cessation Counseling or  Active cessation medication Past 24 months  Past 24 months   Guideline developer: UpToDate (See UpToDate for funding source) Date Released: 2014       Wellness Office Visit  Subjective:  Patient ID: Faith [name], female    DOB: 15-May-1955  Age: 64 y.o. MRN: [id number]  CC: This lady comes in for follow-up after hospitalization rehab treatment post neck surgery. HPI She has a history of chronic multiple sclerosis and more recently and November/December, she was dealing with spinal cord compression which required surgery.  Post rehabilitation, she has done reasonably well.  She is now in an electric wheelchair at home. She had been started on propranolol for tremor and the dose has been escalated.  She does not feel that the propranolol is really helped her whatsoever.  Past Medical History:  Diagnosis Date  . Anemia    my whole life, Used to take iron  . Arthritis    spine, lumbar  . COPD (chronic obstructive pulmonary disease) ([place])   . Emphysema of lung ([place])   . Narcolepsy    by history, has nightmares  . Neuromuscular disorder ([place])    leg issues from spine      Family History  Problem Relation Age of Onset  . Heart attack Mother   . Kidney failure Mother   . Diabetes Mother   . Heart disease Mother   . Hyperlipidemia Mother   . Hypertension Mother   . Heart attack Father 79  . Hypertension Father   . Hypertension Sister   . Diabetes Sister   . Other Brother        house fire  . Hypertension Brother   . Seizures Brother   . Heart disease Brother        mitral valve  . Arthritis Daughter        DJD, AS fibromyalgia  . Polymyositis Daughter   . Cancer Maternal Grandfather        lung  . Heart disease Maternal Grandfather   . Other Paternal Grandmother        house fire  . Alcohol abuse Son     Social History    Social History Narrative   Patient lives with her son. [name], age 65, frequently visits   Social History   Tobacco Use  . Smoking status: Current Every Day Smoker    Packs/day: 0.25    Years: 45.00    Pack years: 11.25  . Smokeless tobacco: Never Used  . Tobacco comment: cutting down  Substance Use Topics  . Alcohol use: Yes    Alcohol/week: 2.0 standard drinks    Types: 2 Cans of beer per week    Current Meds  Medication Sig  . Baclofen 5 MG TABS Take 10 mg by mouth 3 (three) times daily. For spasticity  . celecoxib (CELEBREX) 200 MG capsule Take 1 capsule (200 mg total) by mouth every 12 (twelve) hours.  . Cholecalciferol (VITAMIN D) 50 MCG (2000 UT) CAPS Take 1 capsule (2,000 Units total) by mouth 2 (two) times daily. (Patient taking differently: Take 2,000 Units by mouth daily. )  . diazepam (VALIUM) 5 MG tablet TAKE (1) TABLET BY MOUTH EVERY SIX HOURS AS NEEDED FOR MUSCLE SPASMS.  Marland Kitchen gabapentin (NEURONTIN) 300 MG capsule Take 2 capsules (600 mg total) by mouth 3 (three) times daily.  Marland Kitchen ibuprofen (  ADVIL) 800 MG tablet TAKE (1) TABLET BY MOUTH DAILY AS NEEDED.  Marland Kitchen propranolol (INDERAL) 60 MG tablet Take 1 tablet (60 mg total) by mouth 2 (two) times daily.  . traMADol (ULTRAM) 50 MG tablet Take 2 tablets (100 mg total) by mouth every 6 (six) hours as needed for moderate pain.  . [DISCONTINUED] celecoxib (CELEBREX) 200 MG capsule Take 1 capsule (200 mg total) by mouth every 12 (twelve) hours.  . [DISCONTINUED] traMADol (ULTRAM) 50 MG tablet Take 2 tablets (100 mg total) by mouth every 6 (six) hours as needed for moderate pain.      Objective:   Today's Vitals: BP 130/72 (BP Location: Right Arm, Patient Position: Sitting, Cuff Size: Normal)   Pulse 88   Temp 97.7 F (36.5 C) (Temporal)   Resp 18   Ht 5\' 6"  (1.676 m)   Wt 101 lb 6.4 oz (46 kg)   SpO2 97% Comment: wearing mask.  BMI 16.37 kg/m  Vitals with BMI 07/03/2019 02/28/2019 02/19/2019  Height 5\' 6"  5\' 4"   5\' 4"   Weight 101 lbs 6 oz 95 lbs 95 lbs  BMI 16.37 123XX123 [id number]  Systolic [id number]  Diastolic 72 82 92  Pulse 88 - 84     Physical Exam   She is seated in wheelchair.  Her weight is stable.  Blood pressure is well controlled taking propranolol.  She is alert and orientated.    Assessment   1. Multiple sclerosis ([place])   2. Tremor   3. Osteoarthritis of spine with radiculopathy, lumbar region       Tests ordered No orders of the defined types were placed in this encounter.    Plan: 1. I have refilled her Celebrex and tramadol for pain.  She is not abusing tramadol. 2. I have suggested that she reduce the propranolol from twice a day to alternating with once a day for the next 2 weeks and then go to once a day until I see her in about 1 month's time.  I will make sure that the blood pressure does not escalate dramatically reducing the propranolol.   Meds ordered this encounter  Medications  . traMADol (ULTRAM) 50 MG tablet    Sig: Take 2 tablets (100 mg total) by mouth every 6 (six) hours as needed for moderate pain.    Dispense:  60 tablet    Refill:  3  . celecoxib (CELEBREX) 200 MG capsule    Sig: Take 1 capsule (200 mg total) by mouth every 12 (twelve) hours.    Dispense:  180 capsule    Refill:  1    Deoni Cosey Luther Parody, MD

## 2019-07-03 NOTE — Telephone Encounter (Signed)
Pt came was late due to changes in mobility.

## 2019-07-05 ENCOUNTER — Encounter (INDEPENDENT_AMBULATORY_CARE_PROVIDER_SITE_OTHER): Payer: Self-pay

## 2019-07-06 DIAGNOSIS — G35 Multiple sclerosis: Secondary | ICD-10-CM | POA: Diagnosis not present

## 2019-07-09 DIAGNOSIS — G35 Multiple sclerosis: Secondary | ICD-10-CM | POA: Diagnosis not present

## 2019-07-12 DIAGNOSIS — G35 Multiple sclerosis: Secondary | ICD-10-CM | POA: Diagnosis not present

## 2019-07-16 DIAGNOSIS — G35 Multiple sclerosis: Secondary | ICD-10-CM | POA: Diagnosis not present

## 2019-07-18 DIAGNOSIS — G35 Multiple sclerosis: Secondary | ICD-10-CM | POA: Diagnosis not present

## 2019-07-20 DIAGNOSIS — G35 Multiple sclerosis: Secondary | ICD-10-CM | POA: Diagnosis not present

## 2019-07-23 DIAGNOSIS — G35 Multiple sclerosis: Secondary | ICD-10-CM | POA: Diagnosis not present

## 2019-07-25 DIAGNOSIS — G35 Multiple sclerosis: Secondary | ICD-10-CM | POA: Diagnosis not present

## 2019-07-27 DIAGNOSIS — G35 Multiple sclerosis: Secondary | ICD-10-CM | POA: Diagnosis not present

## 2019-07-28 ENCOUNTER — Other Ambulatory Visit: Payer: Self-pay | Admitting: Physical Medicine and Rehabilitation

## 2019-07-31 DIAGNOSIS — G35 Multiple sclerosis: Secondary | ICD-10-CM | POA: Diagnosis not present

## 2019-08-06 DIAGNOSIS — G35 Multiple sclerosis: Secondary | ICD-10-CM | POA: Diagnosis not present

## 2019-08-08 DIAGNOSIS — G35 Multiple sclerosis: Secondary | ICD-10-CM | POA: Diagnosis not present

## 2019-08-13 ENCOUNTER — Emergency Department (HOSPITAL_COMMUNITY)
Admission: EM | Admit: 2019-08-13 | Discharge: 2019-08-13 | Disposition: A | Discharge status: Home / Self Care | Payer: Medicaid Other | Attending: Emergency Medicine | Admitting: Emergency Medicine

## 2019-08-13 ENCOUNTER — Telehealth (INDEPENDENT_AMBULATORY_CARE_PROVIDER_SITE_OTHER): Payer: Self-pay

## 2019-08-13 ENCOUNTER — Emergency Department (HOSPITAL_COMMUNITY): Payer: Medicaid Other

## 2019-08-13 ENCOUNTER — Other Ambulatory Visit: Payer: Self-pay

## 2019-08-13 ENCOUNTER — Encounter (HOSPITAL_COMMUNITY): Payer: Self-pay | Admitting: *Deleted

## 2019-08-13 DIAGNOSIS — M79674 Pain in right toe(s): Secondary | ICD-10-CM | POA: Diagnosis not present

## 2019-08-13 DIAGNOSIS — G35 Multiple sclerosis: Secondary | ICD-10-CM | POA: Diagnosis not present

## 2019-08-13 DIAGNOSIS — M79672 Pain in left foot: Secondary | ICD-10-CM | POA: Diagnosis not present

## 2019-08-13 DIAGNOSIS — M79675 Pain in left toe(s): Secondary | ICD-10-CM | POA: Insufficient documentation

## 2019-08-13 DIAGNOSIS — R230 Cyanosis: Secondary | ICD-10-CM | POA: Diagnosis not present

## 2019-08-13 NOTE — ED Triage Notes (Addendum)
Left great toe discolored for over 2 weeks, warm to touch

## 2019-08-13 NOTE — Telephone Encounter (Signed)
Do I need to do anything ?

## 2019-08-15 ENCOUNTER — Ambulatory Visit (INDEPENDENT_AMBULATORY_CARE_PROVIDER_SITE_OTHER): Payer: Medicaid Other | Admitting: Internal Medicine

## 2019-08-15 DIAGNOSIS — G35 Multiple sclerosis: Secondary | ICD-10-CM | POA: Diagnosis not present

## 2019-08-17 DIAGNOSIS — G35 Multiple sclerosis: Secondary | ICD-10-CM | POA: Diagnosis not present

## 2019-08-21 ENCOUNTER — Telehealth (INDEPENDENT_AMBULATORY_CARE_PROVIDER_SITE_OTHER): Payer: Self-pay | Admitting: Nurse Practitioner

## 2019-08-21 ENCOUNTER — Ambulatory Visit (INDEPENDENT_AMBULATORY_CARE_PROVIDER_SITE_OTHER): Payer: Medicaid Other | Admitting: Nurse Practitioner

## 2019-08-21 NOTE — Telephone Encounter (Addendum)
Faith [name], according to Nellie, [facility] did not come and pick this patient up for her office visit that was originally scheduled for today (08/21/19). Please call this patient and get her rescheduled.  I would really prefer for the visit to be done in person, so hopefully we can schedule her for a visit when she has an available ride. If she cannot find a ride within the next week then she can have a virtual visit scheduled ONLY if she has a BP cuff avialable to her at home. We need to be able to get her BP for this visit, so if she has no cuff available the visit must be in-person. I do not know what other options there are for transportation. If we need to consider another service maybe we need to call/refer to [employer] or social work for assistance with setting up transportation for this patient.

## 2019-08-22 DIAGNOSIS — M4312 Spondylolisthesis, cervical region: Secondary | ICD-10-CM | POA: Diagnosis not present

## 2019-08-24 DIAGNOSIS — M4312 Spondylolisthesis, cervical region: Secondary | ICD-10-CM | POA: Diagnosis not present

## 2019-08-27 DIAGNOSIS — M4312 Spondylolisthesis, cervical region: Secondary | ICD-10-CM | POA: Diagnosis not present

## 2019-08-29 DIAGNOSIS — M4312 Spondylolisthesis, cervical region: Secondary | ICD-10-CM | POA: Diagnosis not present

## 2019-08-31 DIAGNOSIS — R6889 Other general symptoms and signs: Secondary | ICD-10-CM | POA: Diagnosis not present

## 2019-09-03 DIAGNOSIS — R6889 Other general symptoms and signs: Secondary | ICD-10-CM | POA: Diagnosis not present

## 2019-09-05 DIAGNOSIS — R6889 Other general symptoms and signs: Secondary | ICD-10-CM | POA: Diagnosis not present

## 2019-09-06 ENCOUNTER — Ambulatory Visit (INDEPENDENT_AMBULATORY_CARE_PROVIDER_SITE_OTHER): Payer: Medicaid Other | Admitting: Nurse Practitioner

## 2019-09-07 DIAGNOSIS — R6889 Other general symptoms and signs: Secondary | ICD-10-CM | POA: Diagnosis not present

## 2019-09-10 DIAGNOSIS — R6889 Other general symptoms and signs: Secondary | ICD-10-CM | POA: Diagnosis not present

## 2019-09-12 DIAGNOSIS — R6889 Other general symptoms and signs: Secondary | ICD-10-CM | POA: Diagnosis not present

## 2019-09-14 DIAGNOSIS — R6889 Other general symptoms and signs: Secondary | ICD-10-CM | POA: Diagnosis not present

## 2019-09-19 DIAGNOSIS — R6889 Other general symptoms and signs: Secondary | ICD-10-CM | POA: Diagnosis not present

## 2019-09-20 ENCOUNTER — Other Ambulatory Visit: Payer: Self-pay

## 2019-09-20 ENCOUNTER — Ambulatory Visit (INDEPENDENT_AMBULATORY_CARE_PROVIDER_SITE_OTHER): Payer: Medicaid Other | Admitting: Nurse Practitioner

## 2019-09-20 ENCOUNTER — Encounter (INDEPENDENT_AMBULATORY_CARE_PROVIDER_SITE_OTHER): Payer: Self-pay | Admitting: Nurse Practitioner

## 2019-09-20 VITALS — BP 124/88 | HR 100 | Resp 16 | Ht 64.0 in | Wt 101.0 lb

## 2019-09-20 DIAGNOSIS — G35 Multiple sclerosis: Secondary | ICD-10-CM | POA: Diagnosis not present

## 2019-09-20 DIAGNOSIS — I700 Atherosclerosis of aorta: Secondary | ICD-10-CM

## 2019-09-20 DIAGNOSIS — I739 Peripheral vascular disease, unspecified: Secondary | ICD-10-CM

## 2019-09-20 DIAGNOSIS — Z1329 Encounter for screening for other suspected endocrine disorder: Secondary | ICD-10-CM | POA: Diagnosis not present

## 2019-09-20 DIAGNOSIS — E559 Vitamin D deficiency, unspecified: Secondary | ICD-10-CM

## 2019-09-20 DIAGNOSIS — E785 Hyperlipidemia, unspecified: Secondary | ICD-10-CM | POA: Diagnosis not present

## 2019-09-20 MED ORDER — ATORVASTATIN CALCIUM 10 MG PO TABS: 10.0000 mg | ORAL_TABLET | Freq: Every day | ORAL | 1 refills | Status: DC

## 2019-09-20 NOTE — Progress Notes (Signed)
Subjective:  Patient ID: Faith [name], female    DOB: 1955/03/13  Age: 64 y.o. MRN: [id number]  CC:  Chief Complaint  Patient presents with  . Follow-up    MS/tremor, aortic atherosclerosis, vitamin D deficiency, color changes to left foot      HPI  This patient arrives today for the above.  MS/tremor: She has MS and has been experiencing tremors related to this.  She was on propanolol previously, but felt that the medicine was not helping with her tremor.  She has since weaned herself off of this and tells me that her tremors have not worsened since stopping the medication.  Generally states she is feeling pretty well and she follows up with neurology.  Aortic atherosclerosis: There is evidence of aortic atherosclerosis on previous CT scan of the chest.  She is not currently on any statin therapy.  Last lipid panel showed LDL of 115.  Vitamin D deficiency: She continues on 2000 IUs of vitamin D3 daily.  Color changes to left foot: She has been noticing some color changes to her left foot.  She tells me that he gets a dark blue or black.  She denies any pain to the foot.  She tells me she was evaluated in the ER for this last month and they told her it looked like she may have some vascular issues.   Past Medical History:  Diagnosis Date  . Anemia    my whole life, Used to take iron  . Arthritis    spine, lumbar  . COPD (chronic obstructive pulmonary disease) ([place])   . Emphysema of lung ([employer])   . Narcolepsy    by history, has nightmares  . Neuromuscular disorder ([place])    leg issues from spine      Family History  Problem Relation Age of Onset  . Heart attack Mother   . Kidney failure Mother   . Diabetes Mother   . Heart disease Mother   . Hyperlipidemia Mother   . Hypertension Mother   . Heart attack Father 24  . Hypertension Father   . Hypertension Sister   . Diabetes Sister   . Other Brother        house fire  . Hypertension Brother   . Seizures Brother    . Heart disease Brother        mitral valve  . Arthritis Daughter        DJD, AS fibromyalgia  . Polymyositis Daughter   . Cancer Maternal Grandfather        lung  . Heart disease Maternal Grandfather   . Other Paternal Grandmother        house fire  . Alcohol abuse Son     Social History   Social History Narrative   Patient lives with her son. [name], age 78, frequently visits   Social History   Tobacco Use  . Smoking status: Current Every Day Smoker    Packs/day: 0.25    Years: 45.00    Pack years: 11.25  . Smokeless tobacco: Never Used  . Tobacco comment: cutting down  Substance Use Topics  . Alcohol use: Yes    Alcohol/week: 2.0 standard drinks    Types: 2 Cans of beer per week     Current Meds  Medication Sig  . baclofen (LIORESAL) 10 MG tablet Take 10 mg by mouth 3 (three) times daily.  . celecoxib (CELEBREX) 200 MG capsule Take 1 capsule (200 mg total)  by mouth every 12 (twelve) hours.  . Cholecalciferol 50 MCG (2000 UT) TABS Take 2,000 Int'l Units by mouth daily.  . diazepam (VALIUM) 5 MG tablet TAKE (1) TABLET BY MOUTH EVERY SIX HOURS AS NEEDED FOR MUSCLE SPASMS.  Marland Kitchen gabapentin (NEURONTIN) 300 MG capsule Take 2 capsules (600 mg total) by mouth 3 (three) times daily.  Marland Kitchen ibuprofen (ADVIL) 800 MG tablet TAKE (1) TABLET BY MOUTH DAILY AS NEEDED.  Marland Kitchen traMADol (ULTRAM) 50 MG tablet Take 2 tablets (100 mg total) by mouth every 6 (six) hours as needed for moderate pain.  . [DISCONTINUED] Cholecalciferol (VITAMIN D) 50 MCG (2000 UT) CAPS Take 1 capsule (2,000 Units total) by mouth 2 (two) times daily. (Patient taking differently: Take 2,000 Units by mouth daily. )    ROS:  Review of Systems  Constitutional: Negative.   Respiratory: Negative.   Cardiovascular: Negative.   Neurological: Positive for tremors and weakness.     Objective:   Today's Vitals: BP (!) 124/88   Pulse 100   Resp 16   Ht '5\' 4"'  (1.626 m)   Wt 101 lb (45.8 kg)   SpO2 96%   BMI  17.34 kg/m  Vitals with BMI 09/20/2019 08/13/2019 07/03/2019  Height '5\' 4"'  - '5\' 6"'   Weight 101 lbs - 101 lbs 6 oz  BMI 36.62 - 94.76  Systolic 546 503 546  Diastolic 88 86 72  Pulse 568 80 88     Physical Exam Vitals reviewed.  Constitutional:      General: She is not in acute distress.    Appearance: Normal appearance.  HENT:     Head: Normocephalic and atraumatic.  Neck:     Vascular: No carotid bruit.  Cardiovascular:     Rate and Rhythm: Normal rate and regular rhythm.     Pulses:          Dorsalis pedis pulses are 2+ on the right side and 1+ on the left side.     Heart sounds: Normal heart sounds.  Pulmonary:     Effort: Pulmonary effort is normal.     Breath sounds: Normal breath sounds.  Musculoskeletal:     Right lower leg: No edema.     Left lower leg: No edema.  Feet:     Left foot:     Skin integrity: No warmth (cold to touch).  Skin:    General: Skin is warm and dry.  Neurological:     General: No focal deficit present.     Mental Status: She is alert and oriented to person, place, and time.  Psychiatric:        Mood and Affect: Mood normal.        Behavior: Behavior normal.        Judgment: Judgment normal.          Assessment and Plan   1. Aortic atherosclerosis ([place])   2. Vitamin D deficiency   3. Hyperlipidemia, unspecified hyperlipidemia type   4. Thyroid disorder screen   5. PVD (peripheral vascular disease) ([place])   6. Multiple sclerosis ([place])      Plan: 1.,  3.  Discussed benefits and risks of statin therapy, and she would like to start statin therapy.  We will start her on atorvastatin 10 mg daily and she will return in 6 weeks for blood work recheck.  We will check lipid panel today.  2.  We will check serum vitamin D level today for further evaluation.  4.  We will  screen her thyroid today as is been sometime since blood work has been collected.  5.  It does look like she may have some peripheral vascular disease in her left foot  and I will refer her to vascular surgery for further evaluation and assistance in management.  In the meantime I have encouraged her to stop smoking completely.  6.  Appears stable and blood pressure looks well controlled even without propanolol.  She will continue off propanolol and will follow up with neurology as scheduled for her MS.  Tests ordered Orders Placed This Encounter  Procedures  . CBC  . CMP with eGFR([facility])  . Lipid Panel  . Vitamin D, 25-hydroxy  . TSH  . Ambulatory referral to Vascular Surgery      Meds ordered this encounter  Medications  . atorvastatin (LIPITOR) 10 MG tablet    Sig: Take 1 tablet (10 mg total) by mouth daily.    Dispense:  30 tablet    Refill:  1    Deliver to patient's home    Order Specific Question:   Supervising Provider    Answer:   Doree Albee [[id number]]    Patient to follow-up in 6 weeks, or sooner as needed  Ailene Ards, NP

## 2019-09-21 DIAGNOSIS — R6889 Other general symptoms and signs: Secondary | ICD-10-CM | POA: Diagnosis not present

## 2019-09-21 LAB — LIPID PANEL
Cholesterol: 199 mg/dL (ref <200)
HDL: 97 mg/dL (ref >=50)
LDL Cholesterol (Calc): 85 mg/dL (calc)
Non-HDL Cholesterol (Calc): 102 mg/dL (calc) (ref <130)
Total CHOL/HDL Ratio: 2.1 (calc) (ref <5.0)
Triglycerides: 79 mg/dL (ref <150)

## 2019-09-21 LAB — CBC
HCT: 48 % — ABNORMAL HIGH (ref 35.0–45.0)
Hemoglobin: 16.3 g/dL — ABNORMAL HIGH (ref 11.7–15.5)
MCH: 33.3 pg — ABNORMAL HIGH (ref 27.0–33.0)
MCHC: 34 g/dL (ref 32.0–36.0)
MCV: 98 fL (ref 80.0–100.0)
MPV: 9.9 fL (ref 7.5–12.5)
Platelets: 283 10*3/uL (ref 140–400)
RBC: 4.9 10*6/uL (ref 3.80–5.10)
RDW: 13.5 % (ref 11.0–15.0)
WBC: 8.3 10*3/uL (ref 3.8–10.8)

## 2019-09-21 LAB — COMPLETE METABOLIC PANEL WITH GFR
AG Ratio: 1.8 (calc) (ref 1.0–2.5)
ALT: 9 U/L (ref 6–29)
AST: 16 U/L (ref 10–35)
Albumin: 4.2 g/dL (ref 3.6–5.1)
Alkaline phosphatase (APISO): 70 U/L (ref 37–153)
BUN: 9 mg/dL (ref 7–25)
CO2: 27 mmol/L (ref 20–32)
Calcium: 9.6 mg/dL (ref 8.6–10.4)
Chloride: 103 mmol/L (ref 98–110)
Creat: 0.56 mg/dL (ref 0.50–0.99)
GFR, Est African American: 115 mL/min/{1.73_m2} (ref >=60)
GFR, Est Non African American: 99 mL/min/{1.73_m2} (ref >=60)
Globulin: 2.4 g/dL (calc) (ref 1.9–3.7)
Glucose, Bld: 80 mg/dL (ref 65–99)
Potassium: 4.6 mmol/L (ref 3.5–5.3)
Sodium: 138 mmol/L (ref 135–146)
Total Bilirubin: 0.4 mg/dL (ref 0.2–1.2)
Total Protein: 6.6 g/dL (ref 6.1–8.1)

## 2019-09-21 LAB — TSH: TSH: 1.29 mIU/L (ref 0.40–4.50)

## 2019-09-21 LAB — VITAMIN D 25 HYDROXY (VIT D DEFICIENCY, FRACTURES): Vit D, 25-Hydroxy: 58 ng/mL (ref 30–100)

## 2019-09-24 ENCOUNTER — Encounter: Payer: Self-pay | Admitting: Physical Medicine and Rehabilitation

## 2019-09-24 ENCOUNTER — Other Ambulatory Visit: Payer: Self-pay

## 2019-09-24 ENCOUNTER — Encounter
Payer: Medicaid Other | Attending: Physical Medicine and Rehabilitation | Admitting: Physical Medicine and Rehabilitation

## 2019-09-24 VITALS — BP 134/90 | HR 97 | Temp 98.8°F | Ht 64.0 in | Wt 101.0 lb

## 2019-09-24 DIAGNOSIS — G35 Multiple sclerosis: Secondary | ICD-10-CM | POA: Insufficient documentation

## 2019-09-24 DIAGNOSIS — G959 Disease of spinal cord, unspecified: Secondary | ICD-10-CM | POA: Insufficient documentation

## 2019-09-24 DIAGNOSIS — Z993 Dependence on wheelchair: Secondary | ICD-10-CM | POA: Insufficient documentation

## 2019-09-24 DIAGNOSIS — R296 Repeated falls: Secondary | ICD-10-CM | POA: Diagnosis not present

## 2019-09-24 DIAGNOSIS — R6889 Other general symptoms and signs: Secondary | ICD-10-CM | POA: Diagnosis not present

## 2019-09-24 MED ORDER — BACLOFEN 10 MG PO TABS: 10.0000 mg | ORAL_TABLET | Freq: Three times a day (TID) | ORAL | 11 refills | Status: DC

## 2019-09-24 MED ORDER — DIAZEPAM 5 MG PO TABS: ORAL_TABLET | ORAL | 5 refills | Status: DC

## 2019-09-24 NOTE — Patient Instructions (Signed)
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 No cervical brace required- here for f/u.   1. Needs to do pill box-and do every week! and use cell phone to set alarm for evening meds.   2. Needs to take meds as prescribed- esp baclofen and diazepam  3. Refilled Baclofen, hadn't been refilled since 3/30 and Diazepam last filled 6/1- gave 90 tabs 5 mg q6 hours prn.   4. Don't worry about addiction with Diazepam/Valium- it's for spasticity and usually is very safe for spasticity.   60. Being sent for LLE to check on circulation of L foot  6. F/U 3 months. Last labs from PCP look good- reviewed all labs from 09/20/19

## 2019-09-24 NOTE — Progress Notes (Signed)
Subjective:    Patient ID: Faith [name], female    DOB: [birth year], 64 y.o.   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 No cervical brace required- here for f/u.  Hasn't been here due to ride issues for past 6 months.   Using power w/c to get around.  Hasn't been able to use RW a lot- the aide that comes is not allow to supervise her with RW.   Uses RW in room- around her room, but power w/c around rest of house. Son not good about helping her with RW- uneasy- because falls occasionally-  Hasn't fallen for awhile except Saturday- in bathroom- grabbed edge of sink fell back at that point- hit back of head on bathtub- was bleeding bad - hasn't washed hair since then.   doesn't have hand rails up yet.  Daughter had hip surgery and son's car broke down.   Son stayed up with her to make sure didn't have bad concussion- "should" have gone to hospital- didn't.   Not on any blood thinners.    Has been 2+  Months since previous fall- using power w/c most of time.   Is still taking Diazepam- thinks PCP is writing for it-   Spasms more in RLE than LLE- Hands- spasms/tremors are awful- drops food ll the time.  Diazepam does help tremors/spasms.   Doesn't take like supposed to- 1x in AM- occ in afternoon, but never in evening.   Doesn't make her sleep.  Still getting tramadol.  Goes to see Neurology Wednesday.   Got here by public transportation -[facility]- [facility]???   Has aide now- 3x/week for 2 hours at a time.  Didn't get new BSC.   Still using ramp that she borrowed.  Wondering if we can get her ramp.    Has heard council of Aging does ramps occ- wondering if should call them- I agree.  Took off Propanolol  gradually - didn't make a difference for tremors- when came off it.  BP much better since came off it.   Extremely thirsty all the time lately- drinks 12 oz bottles- 3 bottles in AM- so total 5 12 oz bottles.   Drinks carnation instant breakfast and coffee as well  Explained gabapentin and baclofen can make people feel dry.   Takes baclofen 10 mg 2x/day- not 3x/day.  Forgets at night- has pill box.   Pain Inventory Average Pain 5 Pain Right Now 2 My pain is intermittent, dull and aching  In the last 24 hours, has pain interfered with the following? General activity 0 Relation with others 0 Enjoyment of life 5 What TIME of day is your pain at its worst? evening Sleep (in general) Poor  Pain is worse with: sitting and some activites Pain improves with: rest and medication Relief from Meds: 4  Mobility walk without assistance use a walker how many minutes can you walk? 5 mins ability to climb steps?  no do you drive?  no use a wheelchair transfers alone Do you have any goals in this area?  yes  Function disabled: date disabled Maybe 2016  I need assistance with the following:  feeding, dressing, bathing, toileting, meal prep, household duties and shopping Do you have any goals in this area?  yes  Neuro/Psych weakness numbness tremor trouble walking  Prior Studies Any changes since last visit?  yes  Physicians involved in your care Any changes since last visit?  no   Family History  Problem Relation Age of Onset  . Heart attack Mother   . Kidney failure Mother   . Diabetes Mother   . Heart disease Mother   . Hyperlipidemia Mother   . Hypertension Mother   . Heart attack Father 45  . Hypertension Father   . Hypertension Sister   . Diabetes Sister   . Other Brother        house fire  . Hypertension Brother   . Seizures Brother   . Heart disease Brother        mitral valve  . Arthritis Daughter        DJD, AS fibromyalgia  . Polymyositis Daughter     . Cancer Maternal Grandfather        lung  . Heart disease Maternal Grandfather   . Other Paternal Grandmother        house fire  . Alcohol abuse Son    Social History   Socioeconomic History  . Marital status: Legally Separated    Spouse name: Not on file  . Number of children: 3  . Years of education: 58  . Highest education level: Not on file  Occupational History  . Occupation: disabled    Comment: house painting  Tobacco Use  . Smoking status: Current Every Day Smoker    Packs/day: 0.25    Years: 45.00    Pack years: 11.25  . Smokeless tobacco: Never Used  . Tobacco comment: cutting down  Vaping Use  . Vaping Use: Never used  Substance and Sexual Activity  . Alcohol use: Yes    Alcohol/week: 2.0 standard drinks    Types: 2 Cans of beer per week  . Drug use: No  . Sexual activity: Not Currently    Birth control/protection: Post-menopausal  Other Topics Concern  . Not on file  Social History Narrative   Patient lives with her son. [name], age 14, frequently visits   Social Determinants of Health   Financial Resource Strain:   . Difficulty of Paying Living Expenses:   Food Insecurity:   . Worried About Charity fundraiser in the Last Year:   . Arboriculturist in the Last Year:   Transportation Needs:   . Film/video editor (Medical):   Marland Kitchen Lack of Transportation (Non-Medical):   Physical Activity:   . Days of Exercise per Week:   . Minutes of Exercise per Session:   Stress:   . Feeling of Stress :   Social Connections:   . Frequency of Communication with Friends and Family:   . Frequency of Social Gatherings with Friends and Family:   . Attends Religious Services:   . Active Member of Clubs or Organizations:   . Attends Archivist Meetings:   Marland Kitchen Marital Status:    Past Surgical History:  Procedure Laterality Date  . ANTERIOR CERVICAL DECOMP/DISCECTOMY FUSION N/A 01/19/2019   Procedure: ANTERIOR CERVICAL DECOMPRESSION/DISCECTOMY  Cervical three - four;  Surgeon: Ashok Pall, MD;  Location: [facility];  Service: Neurosurgery;  Laterality: N/A;  . EYE SURGERY Right   . ORIF FACIAL FRACTURE    . POSTERIOR CERVICAL LAMINECTOMY N/A 01/19/2019   Procedure: POSTERIOR CERVICAL LAMINECTOMY Removal of Synovial Cyst and posterior cervical fusion;  Surgeon: Ashok Pall, MD;  [facility]  OR;  Service: Neurosurgery;  Laterality: N/A;  . TUBAL LIGATION    . tubaligation     Past Medical History:  Diagnosis Date  . Anemia    my whole life, Used to take iron  . Arthritis    spine, lumbar  . COPD (chronic obstructive pulmonary disease) ([place])   . Emphysema of lung ([place])   . Narcolepsy    by history, has nightmares  . Neuromuscular disorder ([place])    leg issues from spine   BP (!) 134/90   Pulse 97   Temp 98.8 F (37.1 C)   Ht 5\' 4"  (1.626 m)   Wt 101 lb (45.8 kg) Comment: Per patient pt in motor wheelchair  SpO2 95%   BMI 17.34 kg/m   Opioid Risk Score:   Fall Risk Score:  `1  Depression screen PHQ 2/9  Depression screen [facility] 2/9 09/24/2019 09/20/2019 07/03/2019 12/14/2016 09/07/2016 06/08/2016 01/14/2016  Decreased Interest 0 0 0 0 0 0 0  Down, Depressed, Hopeless 0 0 0 0 0 0 0  PHQ - 2 Score 0 0 0 0 0 0 0   Review of Systems  Constitutional: Negative.   HENT: Negative.   Eyes: Negative.   Respiratory: Negative.   Cardiovascular: Negative.   Endocrine: Negative.   Genitourinary: Negative.   Musculoskeletal: Positive for back pain and gait problem.  Skin: Positive for wound.       Scalp & Left foot  Allergic/Immunologic: Negative.   Neurological: Positive for weakness and numbness.  Hematological: Negative.   Psychiatric/Behavioral: Negative.        Objective:   Physical Exam  Awake, alert, appropriate, in power w/c, takes time to keep her on track- is tangential sometimes, NAD Joystick on R Hoffman's B/L in UEs 5 beats clonus on LLE; 10 beats clonus on RLE MAS of 1 to 1+ in UEs and LEs A few tremors  seen in UEs- but mainly see clonus B/L Don't see open wound on back of head, but do see some dried blood- slightly TTP over top of posterior head.  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 No cervical brace required- here for f/u.   1. Needs to do pill box-and do every week! and use cell phone to set alarm for evening meds.   2. Needs to take meds as prescribed- esp baclofen and diazepam  3. Refilled Baclofen, hadn't been refilled since 3/30 and Diazepam last filled 6/1- gave 90 tabs 5 mg q6 hours prn.   4. Don't worry about addiction with Diazepam/Valium- it's for spasticity and usually is very safe for spasticity.   63. Being sent for LLE to check on circulation of L foot  6. F/U 3 months. Last labs from PCP look good- reviewed all labs from 09/20/19   I spent a total of 35 minutes on appointment- as detailed above- pt NEEDS to take her meds as prescribed.

## 2019-09-26 DIAGNOSIS — Z79899 Other long term (current) drug therapy: Secondary | ICD-10-CM | POA: Diagnosis not present

## 2019-09-26 DIAGNOSIS — G35 Multiple sclerosis: Secondary | ICD-10-CM | POA: Diagnosis not present

## 2019-09-26 DIAGNOSIS — G250 Essential tremor: Secondary | ICD-10-CM | POA: Diagnosis not present

## 2019-09-26 DIAGNOSIS — R261 Paralytic gait: Secondary | ICD-10-CM | POA: Diagnosis not present

## 2019-09-26 DIAGNOSIS — G894 Chronic pain syndrome: Secondary | ICD-10-CM | POA: Diagnosis not present

## 2019-09-26 DIAGNOSIS — R6889 Other general symptoms and signs: Secondary | ICD-10-CM | POA: Diagnosis not present

## 2019-09-27 ENCOUNTER — Other Ambulatory Visit (INDEPENDENT_AMBULATORY_CARE_PROVIDER_SITE_OTHER): Payer: Self-pay | Admitting: Internal Medicine

## 2019-09-28 DIAGNOSIS — R6889 Other general symptoms and signs: Secondary | ICD-10-CM | POA: Diagnosis not present

## 2019-10-01 DIAGNOSIS — R6889 Other general symptoms and signs: Secondary | ICD-10-CM | POA: Diagnosis not present

## 2019-10-01 NOTE — Telephone Encounter (Signed)
Please call this patient and ask her what she takes tramadol for. We generally do not want our patients on opioids for chronic pain if possible.

## 2019-10-03 DIAGNOSIS — R6889 Other general symptoms and signs: Secondary | ICD-10-CM | POA: Diagnosis not present

## 2019-10-06 DIAGNOSIS — R6889 Other general symptoms and signs: Secondary | ICD-10-CM | POA: Diagnosis not present

## 2019-10-08 DIAGNOSIS — R6889 Other general symptoms and signs: Secondary | ICD-10-CM | POA: Diagnosis not present

## 2019-10-10 DIAGNOSIS — R6889 Other general symptoms and signs: Secondary | ICD-10-CM | POA: Diagnosis not present

## 2019-10-19 DIAGNOSIS — R6889 Other general symptoms and signs: Secondary | ICD-10-CM | POA: Diagnosis not present

## 2019-10-22 DIAGNOSIS — R6889 Other general symptoms and signs: Secondary | ICD-10-CM | POA: Diagnosis not present

## 2019-10-24 DIAGNOSIS — R6889 Other general symptoms and signs: Secondary | ICD-10-CM | POA: Diagnosis not present

## 2019-10-26 DIAGNOSIS — R6889 Other general symptoms and signs: Secondary | ICD-10-CM | POA: Diagnosis not present

## 2019-10-29 DIAGNOSIS — R6889 Other general symptoms and signs: Secondary | ICD-10-CM | POA: Diagnosis not present

## 2019-10-31 DIAGNOSIS — R6889 Other general symptoms and signs: Secondary | ICD-10-CM | POA: Diagnosis not present

## 2019-11-01 ENCOUNTER — Ambulatory Visit (INDEPENDENT_AMBULATORY_CARE_PROVIDER_SITE_OTHER): Payer: Medicaid Other | Admitting: Nurse Practitioner

## 2019-11-02 DIAGNOSIS — R6889 Other general symptoms and signs: Secondary | ICD-10-CM | POA: Diagnosis not present

## 2019-11-09 ENCOUNTER — Other Ambulatory Visit: Payer: Self-pay

## 2019-11-09 DIAGNOSIS — I739 Peripheral vascular disease, unspecified: Secondary | ICD-10-CM

## 2019-11-12 DIAGNOSIS — R6889 Other general symptoms and signs: Secondary | ICD-10-CM | POA: Diagnosis not present

## 2019-11-15 ENCOUNTER — Other Ambulatory Visit: Payer: Self-pay

## 2019-11-15 ENCOUNTER — Ambulatory Visit (HOSPITAL_COMMUNITY)
Admission: RE | Admit: 2019-11-15 | Discharge: 2019-11-15 | Disposition: A | Discharge status: Home / Self Care | Payer: Medicaid Other | Source: Ambulatory Visit | Attending: Vascular Surgery | Admitting: Vascular Surgery

## 2019-11-15 DIAGNOSIS — I739 Peripheral vascular disease, unspecified: Secondary | ICD-10-CM | POA: Diagnosis not present

## 2019-11-19 ENCOUNTER — Other Ambulatory Visit: Payer: Self-pay

## 2019-11-19 ENCOUNTER — Ambulatory Visit (INDEPENDENT_AMBULATORY_CARE_PROVIDER_SITE_OTHER): Payer: Medicaid Other | Admitting: Vascular Surgery

## 2019-11-19 ENCOUNTER — Encounter: Payer: Self-pay | Admitting: Vascular Surgery

## 2019-11-19 VITALS — BP 133/88 | HR 77 | Temp 99.4°F | Resp 16 | Ht 63.0 in | Wt 97.8 lb

## 2019-11-19 DIAGNOSIS — I739 Peripheral vascular disease, unspecified: Secondary | ICD-10-CM

## 2019-11-19 NOTE — Progress Notes (Signed)
[facility] [place]  Patient name: Faith [name] MRN: [id number] DOB: 11/27/1955 Sex: female  REASON FOR CONSULT: Evaluate lower extremity arterial insufficiency  HPI: Faith [name] is a 64 y.o. female, who is here today for evaluation of lower extremity arterial insufficiency left greater than right.  She has a very complex history.  She does have MS.  She reports that she has chronic pain all over associated with this.  She also has cervical disease and underwent cervical disc surgery.  She has frequent falls.  She is mostly wheelchair-bound but reports that she can walk around her home with a walker.  She is here today with her daughter.  She had an episode for 5 weeks ago when she had darkened discoloration of the distal half of her left foot.  This has resolved.  She does have dependent rubor and purple changes in her feet bilaterally with dependency.  She has chronic numbness but no new specific pain in her feet.  She has no tissue loss  Past Medical History:  Diagnosis Date  . Anemia    my whole life, Used to take iron  . Arthritis    spine, lumbar  . COPD (chronic obstructive pulmonary disease) ([place])   . Emphysema of lung ([place])   . Narcolepsy    by history, has nightmares  . Neuromuscular disorder ([place])    leg issues from spine    Family History  Problem Relation Age of Onset  . Heart attack Mother   . Kidney failure Mother   . Diabetes Mother   . Heart disease Mother   . Hyperlipidemia Mother   . Hypertension Mother   . Heart attack Father 13  . Hypertension Father   . Hypertension Sister   . Diabetes Sister   . Other Brother        house fire  . Hypertension Brother   . Seizures Brother   . Heart disease Brother        mitral valve  . Arthritis Daughter        DJD, AS fibromyalgia  . Polymyositis Daughter   . Cancer Maternal Grandfather        lung  . Heart disease Maternal Grandfather   . Other  Paternal Grandmother        house fire  . Alcohol abuse Son     SOCIAL HISTORY: Social History   Socioeconomic History  . Marital status: Legally Separated    Spouse name: Not on file  . Number of children: 3  . Years of education: 30  . Highest education level: Not on file  Occupational History  . Occupation: disabled    Comment: house painting  Tobacco Use  . Smoking status: Current Every Day Smoker    Packs/day: 0.25    Years: 45.00    Pack years: 11.25  . Smokeless tobacco: Never Used  . Tobacco comment: cutting down  Vaping Use  . Vaping Use: Never used  Substance and Sexual Activity  . Alcohol use: Yes    Alcohol/week: 2.0 standard drinks    Types: 2 Cans of beer per week  . Drug use: No  . Sexual activity: Not Currently    Birth control/protection: Post-menopausal  Other Topics Concern  . Not on file  Social History Narrative   Patient lives with her son. Faith [name], age 21, frequently visits   Social Determinants of Health   Financial Resource Strain:   . Difficulty of  Paying Living Expenses: Not on file  Food Insecurity:   . Worried About Charity fundraiser in the Last Year: Not on file  . Ran Out of Food in the Last Year: Not on file  Transportation Needs:   . Lack of Transportation (Medical): Not on file  . Lack of Transportation (Non-Medical): Not on file  Physical Activity:   . Days of Exercise per Week: Not on file  . Minutes of Exercise per Session: Not on file  Stress:   . Feeling of Stress : Not on file  Social Connections:   . Frequency of Communication with Friends and Family: Not on file  . Frequency of Social Gatherings with Friends and Family: Not on file  . Attends Religious Services: Not on file  . Active Member of Clubs or Organizations: Not on file  . Attends Archivist Meetings: Not on file  . Marital Status: Not on file  Intimate Partner Violence:   . Fear of Current or Ex-Partner: Not on file  . Emotionally  Abused: Not on file  . Physically Abused: Not on file  . Sexually Abused: Not on file    Allergies  Allergen Reactions  . Darvon [Propoxyphene] Other (See Comments)    Hallicuations  . Penicillins Rash    Did it involve swelling of the face/tongue/throat, SOB, or low BP? Unknown Did it involve sudden or severe rash/hives, skin peeling, or any reaction on the inside of your mouth or nose? Unknown Did you need to seek medical attention at a hospital or doctor's office? Unknown When did it last happen?Unknown If all above answers are "NO", may proceed with cephalosporin use.     Current Outpatient Medications  Medication Sig Dispense Refill  . atorvastatin (LIPITOR) 10 MG tablet Take 1 tablet (10 mg total) by mouth daily. 30 tablet 1  . baclofen (LIORESAL) 10 MG tablet Take 1 tablet (10 mg total) by mouth 3 (three) times daily. 90 each 11  . celecoxib (CELEBREX) 200 MG capsule Take 1 capsule (200 mg total) by mouth every 12 (twelve) hours. 180 capsule 1  . Cholecalciferol 50 MCG (2000 UT) TABS Take 2,000 Int'l Units by mouth daily.    . diazepam (VALIUM) 5 MG tablet TAKE (1) TABLET BY MOUTH EVERY SIX HOURS AS NEEDED FOR MUSCLE SPASMS. At least 3x/day- need to take it. For spasms 90 tablet 5  . gabapentin (NEURONTIN) 300 MG capsule Take 2 capsules (600 mg total) by mouth 3 (three) times daily. 180 capsule 1  . ibuprofen (ADVIL) 800 MG tablet TAKE (1) TABLET BY MOUTH DAILY AS NEEDED. 30 tablet 0  . traMADol (ULTRAM) 50 MG tablet Take 2 tablets (100 mg total) by mouth every 6 (six) hours as needed for moderate pain. 60 tablet 3   No current facility-administered medications for this visit.    REVIEW OF SYSTEMS:  [X]  denotes positive finding, [ ]  denotes negative finding Cardiac  Comments:  Chest pain or chest pressure:    Shortness of breath upon exertion:    Short of breath when lying flat:    Irregular heart rhythm:        Vascular    Pain in calf, thigh, or hip brought on  by ambulation:    Pain in feet at night that wakes you up from your sleep:  x   Blood clot in your veins:    Leg swelling:         Pulmonary    Oxygen at home:  Productive cough:     Wheezing:         Neurologic    Sudden weakness in arms or legs:     Sudden numbness in arms or legs:     Sudden onset of difficulty speaking or slurred speech:    Temporary loss of vision in one eye:     Problems with dizziness:  x       Gastrointestinal    Blood in stool:     Vomited blood:         Genitourinary    Burning when urinating:     Blood in urine:        Psychiatric    Major depression:         Hematologic    Bleeding problems:    Problems with blood clotting too easily:        Skin    Rashes or ulcers:        Constitutional    Fever or chills:      PHYSICAL EXAM: Vitals:   11/19/19 1325  BP: 133/88  Pulse: 77  Resp: 16  Temp: 99.4 F (37.4 C)  TempSrc: Oral  SpO2: 95%  Weight: 97 lb 12.8 oz (44.4 kg)  Height: 5\' 3"  (1.6 m)    GENERAL: The patient is a well-nourished female, in no acute distress. The vital signs are documented above. CARDIOVASCULAR: Radial pulses 2+ bilaterally.  2+ femoral pulses and 2+ popliteal pulses bilaterally.  Absent pedal pulses. PULMONARY: There is good air exchange  ABDOMEN: Soft and non-tender  MUSCULOSKELETAL: There are no major deformities or cyanosis. NEUROLOGIC: No focal weakness or paresthesias are detected. SKIN: There are no ulcers or rashes noted. PSYCHIATRIC: The patient has a normal affect.  DATA:  The patient had undergone noninvasive studies at [facility] on 11/15/2019.  This revealed ankle arm index of 0.84 on the right and 0.24 on the left.  With hand-held Doppler today she does have audible flow in her dorsalis pedis and posterior tibial on the left.  MEDICAL ISSUES: Very difficult management decisions regarding this patient.  She does have severe ischemia of her left foot.  She does very little walking  related to her MS and does not appear to have any symptoms specifically related to ischemia.  I did explain the need to keep a very close eye on her feet and let us know if she develops any wounds.  She does have color changes on both feet with minimal atherosclerotic change on the right.  I will see her again in 3 months for close follow-up.   Rosetta Posner, MD FACS [facility] Tel [phone] Pager [phone]

## 2019-11-28 ENCOUNTER — Other Ambulatory Visit: Payer: Self-pay

## 2019-11-28 ENCOUNTER — Encounter (INDEPENDENT_AMBULATORY_CARE_PROVIDER_SITE_OTHER): Payer: Self-pay | Admitting: Nurse Practitioner

## 2019-11-28 ENCOUNTER — Ambulatory Visit (INDEPENDENT_AMBULATORY_CARE_PROVIDER_SITE_OTHER): Payer: Medicaid Other | Admitting: Nurse Practitioner

## 2019-11-28 VITALS — BP 141/64 | HR 92 | Temp 96.9°F | Resp 18 | Ht 64.0 in | Wt 98.0 lb

## 2019-11-28 DIAGNOSIS — E785 Hyperlipidemia, unspecified: Secondary | ICD-10-CM | POA: Diagnosis not present

## 2019-11-28 DIAGNOSIS — Z23 Encounter for immunization: Secondary | ICD-10-CM | POA: Diagnosis not present

## 2019-11-28 DIAGNOSIS — E559 Vitamin D deficiency, unspecified: Secondary | ICD-10-CM

## 2019-11-28 DIAGNOSIS — D582 Other hemoglobinopathies: Secondary | ICD-10-CM | POA: Diagnosis not present

## 2019-11-28 DIAGNOSIS — Z720 Tobacco use: Secondary | ICD-10-CM

## 2019-11-28 DIAGNOSIS — I739 Peripheral vascular disease, unspecified: Secondary | ICD-10-CM | POA: Diagnosis not present

## 2019-11-28 DIAGNOSIS — E782 Mixed hyperlipidemia: Secondary | ICD-10-CM | POA: Insufficient documentation

## 2019-11-28 HISTORY — DX: Hyperlipidemia, unspecified: E78.5

## 2019-11-28 NOTE — Progress Notes (Signed)
Subjective:  Patient ID: Faith [name], female    DOB: [birth year]  Age: 64 y.o. MRN: [id number]  CC:  Chief Complaint  Patient presents with  . Follow-up  . Flu Vaccine  . Hyperlipidemia  . Other    PVD, smoking cessation, vitamin D deficiency, elevated hemoglobin      HPI  This patient arrives today for the above.  Patient is requesting flu vaccine to be administered today.  Hyperlipidemia: She was started on atorvastatin last office visit.  She tells me she tolerating medication well.  She is due to have metabolic panel panel rechecked today.  Peripheral vascular disease: She is followed by vascular surgery.  She tells me she is experiencing some increased pain to the lateral aspect of her right ankle.  She has not called her vascular surgeon to discuss this.  Smoking: She continues to smoke cigarettes.  Vitamin D deficiency: She continues on vitamin D3 supplement, last serum check was 58.  Elevated hemoglobin: At last office visit blood work was collected and did show hemoglobin elevated at level of 16.3.  It had not been elevated previously.  Past Medical History:  Diagnosis Date  . Anemia    my whole life, Used to take iron  . Arthritis    spine, lumbar  . COPD (chronic obstructive pulmonary disease) ([place])   . Emphysema of lung ([place])   . Hyperlipidemia 11/28/2019  . Narcolepsy    by history, has nightmares  . Neuromuscular disorder ([place])    leg issues from spine      Family History  Problem Relation Age of Onset  . Heart attack Mother   . Kidney failure Mother   . Diabetes Mother   . Heart disease Mother   . Hyperlipidemia Mother   . Hypertension Mother   . Heart attack Father 16  . Hypertension Father   . Hypertension Sister   . Diabetes Sister   . Other Brother        house fire  . Hypertension Brother   . Seizures Brother   . Heart disease Brother        mitral valve  . Arthritis Daughter        DJD, AS fibromyalgia  . Polymyositis  Daughter   . Cancer Maternal Grandfather        lung  . Heart disease Maternal Grandfather   . Other Paternal Grandmother        house fire  . Alcohol abuse Son     Social History   Social History Narrative   Patient lives with her son. [name], age 82, frequently visits   Social History   Tobacco Use  . Smoking status: Current Every Day Smoker    Packs/day: 0.25    Years: 45.00    Pack years: 11.25  . Smokeless tobacco: Never Used  . Tobacco comment: cutting down  Substance Use Topics  . Alcohol use: Yes    Alcohol/week: 2.0 standard drinks    Types: 2 Cans of beer per week     Current Meds  Medication Sig  . atorvastatin (LIPITOR) 10 MG tablet Take 1 tablet (10 mg total) by mouth daily.  . baclofen (LIORESAL) 10 MG tablet Take 1 tablet (10 mg total) by mouth 3 (three) times daily.  . celecoxib (CELEBREX) 200 MG capsule Take 1 capsule (200 mg total) by mouth every 12 (twelve) hours.  . Cholecalciferol 50 MCG (2000 UT) TABS Take 2,000 Int'l Units by mouth  daily.  . diazepam (VALIUM) 5 MG tablet TAKE (1) TABLET BY MOUTH EVERY SIX HOURS AS NEEDED FOR MUSCLE SPASMS. At least 3x/day- need to take it. For spasms  . gabapentin (NEURONTIN) 300 MG capsule Take 2 capsules (600 mg total) by mouth 3 (three) times daily.  Marland Kitchen ibuprofen (ADVIL) 800 MG tablet TAKE (1) TABLET BY MOUTH DAILY AS NEEDED.  Marland Kitchen traMADol (ULTRAM) 50 MG tablet Take 2 tablets (100 mg total) by mouth every 6 (six) hours as needed for moderate pain.    ROS:  Review of Systems  Constitutional: Positive for malaise/fatigue. Negative for fever.  HENT: Negative.   Eyes: Negative.   Respiratory: Negative.   Cardiovascular: Negative.   Neurological: Positive for tremors.     Objective:   Today's Vitals: BP (!) 141/64 (BP Location: Right Arm, Patient Position: Sitting, Cuff Size: Small)   Pulse 92   Temp (!) 96.9 F (36.1 C) (Temporal)   Resp 18   Ht '5\' 4"'  (1.626 m)   Wt 98 lb (44.5 kg)   SpO2 99%    BMI 16.82 kg/m  Vitals with BMI 11/28/2019 11/19/2019 09/24/2019  Height '5\' 4"'  '5\' 3"'  '5\' 4"'   Weight 98 lbs 97 lbs 13 oz 101 lbs  BMI 16.81 82.99 37.16  Systolic 967 893 810  Diastolic 64 88 90  Pulse 92 77 97     Physical Exam Vitals reviewed.  Constitutional:      General: She is not in acute distress.    Appearance: Normal appearance.  HENT:     Head: Normocephalic and atraumatic.  Neck:     Vascular: No carotid bruit.  Cardiovascular:     Rate and Rhythm: Normal rate and regular rhythm.     Pulses:          Dorsalis pedis pulses are 0 on the right side.     Heart sounds: Normal heart sounds.  Pulmonary:     Effort: Pulmonary effort is normal.     Breath sounds: Normal breath sounds.  Feet:     Right foot:     Skin integrity: Erythema present. No ulcer, blister, skin breakdown or warmth.  Skin:    General: Skin is warm and dry.  Neurological:     General: No focal deficit present.     Mental Status: She is alert and oriented to person, place, and time.     Motor: Tremor present.  Psychiatric:        Mood and Affect: Mood normal.        Behavior: Behavior normal.        Judgment: Judgment normal.          Assessment and Plan   1. Elevated hemoglobin (HCC)   2. Hyperlipidemia, unspecified hyperlipidemia type   3. Flu vaccine need   4. PVD (peripheral vascular disease) ([place])   5. Tobacco abuse   6. Vitamin D deficiency      Plan: 1.  Repeat CBC, consider referral to hematology if hemoglobin remains elevated. 2.  Repeat CMP and lipid panel.  Consider increasing dose of atorvastatin pending blood results. 3.  Flu vaccine administered today. 4.  Patient was told to call Dr. Donnetta Hutching and discuss pain in her foot.  The foot itself appears stable visually.  Do not see any skin breakdown or ulcer formation.  Discussed association between smoking and peripheral vascular disease.  Encouraged tobacco cessation.  Discussed that if the pain becomes more severe, if she  experiences skin color changes,  or experiences any ulceration she needs to proceed to the emergency department. 5.  Tobacco cessation counseling discussed.  She refuses nicotine patch at this time due to negative adverse reaction in the past. 6.  She will continue on vitamin D supplement.   Tests ordered Orders Placed This Encounter  Procedures  . Lipid Panel  . CMP with eGFR([facility])  . CBC with Differential/Platelets      No orders of the defined types were placed in this encounter.   Patient to follow-up in 6 to 8 weeks or sooner as needed  Ailene Ards, NP

## 2019-11-29 ENCOUNTER — Telehealth (INDEPENDENT_AMBULATORY_CARE_PROVIDER_SITE_OTHER): Payer: Self-pay

## 2019-11-29 ENCOUNTER — Other Ambulatory Visit (INDEPENDENT_AMBULATORY_CARE_PROVIDER_SITE_OTHER): Payer: Self-pay | Admitting: Nurse Practitioner

## 2019-11-29 ENCOUNTER — Encounter (INDEPENDENT_AMBULATORY_CARE_PROVIDER_SITE_OTHER): Payer: Self-pay | Admitting: Nurse Practitioner

## 2019-11-29 DIAGNOSIS — E559 Vitamin D deficiency, unspecified: Secondary | ICD-10-CM

## 2019-11-29 DIAGNOSIS — Z1329 Encounter for screening for other suspected endocrine disorder: Secondary | ICD-10-CM

## 2019-11-29 DIAGNOSIS — I700 Atherosclerosis of aorta: Secondary | ICD-10-CM

## 2019-11-29 DIAGNOSIS — E785 Hyperlipidemia, unspecified: Secondary | ICD-10-CM

## 2019-11-29 DIAGNOSIS — D751 Secondary polycythemia: Secondary | ICD-10-CM

## 2019-11-29 LAB — CBC WITH DIFFERENTIAL/PLATELET
Absolute Monocytes: 540 cells/uL (ref 200–950)
Basophils Absolute: 23 cells/uL (ref 0–200)
Basophils Relative: 0.3 %
Eosinophils Absolute: 0 cells/uL — ABNORMAL LOW (ref 15–500)
Eosinophils Relative: 0 %
HCT: 50.7 % — ABNORMAL HIGH (ref 35.0–45.0)
Hemoglobin: 17.5 g/dL — ABNORMAL HIGH (ref 11.7–15.5)
Lymphs Abs: 1824 cells/uL (ref 850–3900)
MCH: 33.1 pg — ABNORMAL HIGH (ref 27.0–33.0)
MCHC: 34.5 g/dL (ref 32.0–36.0)
MCV: 95.8 fL (ref 80.0–100.0)
MPV: 10.5 fL (ref 7.5–12.5)
Monocytes Relative: 7.1 %
Neutro Abs: 5214 cells/uL (ref 1500–7800)
Neutrophils Relative %: 68.6 %
Platelets: 261 10*3/uL (ref 140–400)
RBC: 5.29 10*6/uL — ABNORMAL HIGH (ref 3.80–5.10)
RDW: 11.8 % (ref 11.0–15.0)
Total Lymphocyte: 24 %
WBC: 7.6 10*3/uL (ref 3.8–10.8)

## 2019-11-29 LAB — COMPLETE METABOLIC PANEL WITH GFR
AG Ratio: 2 (calc) (ref 1.0–2.5)
ALT: 14 U/L (ref 6–29)
AST: 18 U/L (ref 10–35)
Albumin: 4.6 g/dL (ref 3.6–5.1)
Alkaline phosphatase (APISO): 71 U/L (ref 37–153)
BUN: 7 mg/dL (ref 7–25)
CO2: 28 mmol/L (ref 20–32)
Calcium: 9.5 mg/dL (ref 8.6–10.4)
Chloride: 99 mmol/L (ref 98–110)
Creat: 0.53 mg/dL (ref 0.50–0.99)
GFR, Est African American: 117 mL/min/{1.73_m2} (ref >=60)
GFR, Est Non African American: 101 mL/min/{1.73_m2} (ref >=60)
Globulin: 2.3 g/dL (calc) (ref 1.9–3.7)
Glucose, Bld: 74 mg/dL (ref 65–99)
Potassium: 4.6 mmol/L (ref 3.5–5.3)
Sodium: 137 mmol/L (ref 135–146)
Total Bilirubin: 0.5 mg/dL (ref 0.2–1.2)
Total Protein: 6.9 g/dL (ref 6.1–8.1)

## 2019-11-29 LAB — LIPID PANEL
Cholesterol: 199 mg/dL (ref <200)
HDL: 87 mg/dL (ref >=50)
LDL Cholesterol (Calc): 97 mg/dL (calc)
Non-HDL Cholesterol (Calc): 112 mg/dL (calc) (ref <130)
Total CHOL/HDL Ratio: 2.3 (calc) (ref <5.0)
Triglycerides: 65 mg/dL (ref <150)

## 2019-11-29 MED ORDER — ATORVASTATIN CALCIUM 10 MG PO TABS: 10.0000 mg | ORAL_TABLET | Freq: Every day | ORAL | 1 refills | Status: DC

## 2019-11-29 NOTE — Telephone Encounter (Signed)
Ashley,RN  @ [facility] [employer], called asked we write a letter for [employer]. Letter should state:  Letter for medical services for [facility] to provide her treatment plan . Code: [id number]  To fax :  [phone]

## 2019-11-29 NOTE — Telephone Encounter (Signed)
Letter written and placed in "to be faxed" folder.

## 2019-12-03 ENCOUNTER — Other Ambulatory Visit (HOSPITAL_COMMUNITY): Payer: Self-pay

## 2019-12-03 DIAGNOSIS — D751 Secondary polycythemia: Secondary | ICD-10-CM

## 2019-12-03 DIAGNOSIS — R319 Hematuria, unspecified: Secondary | ICD-10-CM

## 2019-12-04 ENCOUNTER — Inpatient Hospital Stay (HOSPITAL_COMMUNITY): Payer: Medicaid Other | Attending: Hematology

## 2019-12-04 ENCOUNTER — Other Ambulatory Visit: Payer: Self-pay

## 2019-12-04 ENCOUNTER — Other Ambulatory Visit (INDEPENDENT_AMBULATORY_CARE_PROVIDER_SITE_OTHER): Payer: Self-pay

## 2019-12-04 DIAGNOSIS — M8588 Other specified disorders of bone density and structure, other site: Secondary | ICD-10-CM | POA: Diagnosis not present

## 2019-12-04 DIAGNOSIS — D751 Secondary polycythemia: Secondary | ICD-10-CM | POA: Insufficient documentation

## 2019-12-04 DIAGNOSIS — M85861 Other specified disorders of bone density and structure, right lower leg: Secondary | ICD-10-CM | POA: Insufficient documentation

## 2019-12-04 DIAGNOSIS — F17210 Nicotine dependence, cigarettes, uncomplicated: Secondary | ICD-10-CM | POA: Diagnosis not present

## 2019-12-04 DIAGNOSIS — G35 Multiple sclerosis: Secondary | ICD-10-CM | POA: Diagnosis not present

## 2019-12-04 DIAGNOSIS — M8580 Other specified disorders of bone density and structure, unspecified site: Secondary | ICD-10-CM | POA: Diagnosis not present

## 2019-12-04 DIAGNOSIS — R319 Hematuria, unspecified: Secondary | ICD-10-CM

## 2019-12-04 LAB — COMPREHENSIVE METABOLIC PANEL
ALT: 16 U/L (ref 0–44)
AST: 20 U/L (ref 15–41)
Albumin: 4 g/dL (ref 3.5–5.0)
Alkaline Phosphatase: 53 U/L (ref 38–126)
Anion gap: 9 (ref 5–15)
BUN: 13 mg/dL (ref 8–23)
CO2: 26 mmol/L (ref 22–32)
Calcium: 9.1 mg/dL (ref 8.9–10.3)
Chloride: 100 mmol/L (ref 98–111)
Creatinine, Ser: 0.52 mg/dL (ref 0.44–1.00)
GFR calc non Af Amer: >60 mL/min (ref >60)
Glucose, Bld: 72 mg/dL (ref 70–99)
Potassium: 4.3 mmol/L (ref 3.5–5.1)
Sodium: 135 mmol/L (ref 135–145)
Total Bilirubin: 0.4 mg/dL (ref 0.3–1.2)
Total Protein: 6.7 g/dL (ref 6.5–8.1)

## 2019-12-04 LAB — CBC WITH DIFFERENTIAL/PLATELET
Abs Immature Granulocytes: 0.05 10*3/uL (ref 0.00–0.07)
Basophils Absolute: 0.1 10*3/uL (ref 0.0–0.1)
Basophils Relative: 1 %
Eosinophils Absolute: 0.3 10*3/uL (ref 0.0–0.5)
Eosinophils Relative: 3 %
HCT: 46.8 % — ABNORMAL HIGH (ref 36.0–46.0)
Hemoglobin: 16.1 g/dL — ABNORMAL HIGH (ref 12.0–15.0)
Immature Granulocytes: 1 %
Lymphocytes Relative: 27 %
Lymphs Abs: 2.5 10*3/uL (ref 0.7–4.0)
MCH: 33.4 pg (ref 26.0–34.0)
MCHC: 34.4 g/dL (ref 30.0–36.0)
MCV: 97.1 fL (ref 80.0–100.0)
Monocytes Absolute: 0.9 10*3/uL (ref 0.1–1.0)
Monocytes Relative: 10 %
Neutro Abs: 5.5 10*3/uL (ref 1.7–7.7)
Neutrophils Relative %: 58 %
Platelets: 254 10*3/uL (ref 150–400)
RBC: 4.82 MIL/uL (ref 3.87–5.11)
RDW: 12.1 % (ref 11.5–15.5)
WBC: 9.2 10*3/uL (ref 4.0–10.5)
nRBC: 0 % (ref 0.0–0.2)

## 2019-12-04 LAB — LACTATE DEHYDROGENASE: LDH: 146 U/L (ref 98–192)

## 2019-12-04 MED ORDER — TRAMADOL HCL 50 MG PO TABS: 100.0000 mg | ORAL_TABLET | Freq: Four times a day (QID) | ORAL | 3 refills | Status: DC | PRN

## 2019-12-10 ENCOUNTER — Other Ambulatory Visit (INDEPENDENT_AMBULATORY_CARE_PROVIDER_SITE_OTHER): Payer: Self-pay | Admitting: Nurse Practitioner

## 2019-12-10 ENCOUNTER — Encounter (INDEPENDENT_AMBULATORY_CARE_PROVIDER_SITE_OTHER): Payer: Self-pay | Admitting: Nurse Practitioner

## 2019-12-10 DIAGNOSIS — G35 Multiple sclerosis: Secondary | ICD-10-CM

## 2019-12-10 NOTE — Progress Notes (Signed)
Ms. [facility] home health nurses request, I am ordering patient a bedside commode that she can use at home.  Please fax this order to [facility] and then call the patient to let her know that the order was faxed over there so that she should be able to pick up a bedside commode.  I will place the order in your folder.  Thank you.

## 2019-12-11 DIAGNOSIS — G35 Multiple sclerosis: Secondary | ICD-10-CM | POA: Diagnosis not present

## 2019-12-11 DIAGNOSIS — R2681 Unsteadiness on feet: Secondary | ICD-10-CM | POA: Diagnosis not present

## 2019-12-13 ENCOUNTER — Inpatient Hospital Stay (HOSPITAL_BASED_OUTPATIENT_CLINIC_OR_DEPARTMENT_OTHER): Payer: Medicaid Other | Admitting: Oncology

## 2019-12-13 ENCOUNTER — Other Ambulatory Visit: Payer: Self-pay

## 2019-12-13 VITALS — BP 139/83 | HR 76 | Resp 16 | Wt 97.9 lb

## 2019-12-13 DIAGNOSIS — D751 Secondary polycythemia: Secondary | ICD-10-CM

## 2019-12-13 DIAGNOSIS — Z720 Tobacco use: Secondary | ICD-10-CM

## 2019-12-13 NOTE — Progress Notes (Signed)
CONSULT NOTE  Patient Care Team: Doree Albee, MD as PCP - General (Internal Medicine) Danie Binder, MD (Inactive) as Consulting Physician (Gastroenterology)  CHIEF COMPLAINTS/PURPOSE OF CONSULTATION:  Dr. Anastasio Champion  HISTORY OF PRESENTING ILLNESS:  Faith [name] 64 y.o. female is seen for further work-up of elevated hemoglobin and hematocrit at the request of [name], NP at Dr. Lanice Shirts office.  CBC on 03/27/2018 showed elevated hemoglobin of 16.4 and hematocrit of 46 with a normal RBC count.  This was again repeated on 04/25/2018 with hemoglobin 16.2 and hematocrit of 45.5.  White count and platelet count were within normal limits.  She is not on any diuretics.  She reports that she has been smoking 5 cigarettes/day for the last 5 years.  Prior to that she smoked 1 pack/day for 35 years.  Denies any change in cough or hemoptysis.  She had a CT scan lung screening protocol in February which was lung RADS 2.  Denies any fevers, night sweats or weight loss in the last 6 months.  Denies any blood in the urine.  Denies any recurrent infections or recent hospitalizations.  No bleeding per rectum or melena.  Never had a colonoscopy.  Did not have a mammogram in the last couple of years.  Bone density test in 2018 showed osteopenia.  She has MS and has been on Tysabri for a long time.  They stopped Tysabri and are planning to switch to Ocrevus.  She lives at home with son and grandson and walks with the help of walker.  She is able to do some ADLs and IADLs.  She cannot drive.  Family history significant for maternal grandfather who had lung cancer.  No family history of any blood malignancies.  Today she returns for follow-up and to review lab work for erythrocytosis back in July 2020.  No significant change since her last visit.  She continues to have very little energy and all over pain secondary to MS.  Appetite is stable.  She has not had Tysabri or Ocrevus in several months secondary to poor  tolerance of Ocrevus.  She developed daily fever and dizziness.  The symptoms have resolved.  Unfortunately, her health insurance would not continue to pay for Tysabri so she is waiting on approval to restart.  States she will be seeing her neurologist soon.  She continues to smoke 3 to 5 cigarettes daily.  MEDICAL HISTORY:  Past Medical History:  Diagnosis Date  . Anemia    my whole life, Used to take iron  . Arthritis    spine, lumbar  . COPD (chronic obstructive pulmonary disease) ([place])   . Emphysema of lung ([place])   . Hyperlipidemia 11/28/2019  . Narcolepsy    by history, has nightmares  . Neuromuscular disorder ([place])    leg issues from spine    SURGICAL HISTORY: Past Surgical History:  Procedure Laterality Date  . ANTERIOR CERVICAL DECOMP/DISCECTOMY FUSION N/A 01/19/2019   Procedure: ANTERIOR CERVICAL DECOMPRESSION/DISCECTOMY Cervical three - four;  Surgeon: Ashok Pall, MD;  Location: [place];  Service: Neurosurgery;  Laterality: N/A;  . EYE SURGERY Right   . ORIF FACIAL FRACTURE    . POSTERIOR CERVICAL LAMINECTOMY N/A 01/19/2019   Procedure: POSTERIOR CERVICAL LAMINECTOMY Removal of Synovial Cyst and posterior cervical fusion;  Surgeon: Ashok Pall, MD;  Location: [place];  Service: Neurosurgery;  Laterality: N/A;  . TUBAL LIGATION    . tubaligation      SOCIAL HISTORY: Social History  Socioeconomic History  . Marital status: Legally Separated    Spouse name: Not on file  . Number of children: 3  . Years of education: 20  . Highest education level: Not on file  Occupational History  . Occupation: disabled    Comment: house painting  Tobacco Use  . Smoking status: Current Every Day Smoker    Packs/day: 0.25    Years: 45.00    Pack years: 11.25  . Smokeless tobacco: Never Used  . Tobacco comment: cutting down  Vaping Use  . Vaping Use: Never used  Substance and Sexual Activity  . Alcohol use: Yes    Alcohol/week: 2.0 standard drinks    Types: 2 Cans of beer  per week  . Drug use: No  . Sexual activity: Not Currently    Birth control/protection: Post-menopausal  Other Topics Concern  . Not on file  Social History Narrative   Patient lives with her son. [name], age 18, frequently visits   Social Determinants of Health   Financial Resource Strain:   . Difficulty of Paying Living Expenses: Not on file  Food Insecurity:   . Worried About Charity fundraiser in the Last Year: Not on file  . Ran Out of Food in the Last Year: Not on file  Transportation Needs:   . Lack of Transportation (Medical): Not on file  . Lack of Transportation (Non-Medical): Not on file  Physical Activity:   . Days of Exercise per Week: Not on file  . Minutes of Exercise per Session: Not on file  Stress:   . Feeling of Stress : Not on file  Social Connections:   . Frequency of Communication with Friends and Family: Not on file  . Frequency of Social Gatherings with Friends and Family: Not on file  . Attends Religious Services: Not on file  . Active Member of Clubs or Organizations: Not on file  . Attends Archivist Meetings: Not on file  . Marital Status: Not on file  Intimate Partner Violence:   . Fear of Current or Ex-Partner: Not on file  . Emotionally Abused: Not on file  . Physically Abused: Not on file  . Sexually Abused: Not on file    FAMILY HISTORY: Family History  Problem Relation Age of Onset  . Heart attack Mother   . Kidney failure Mother   . Diabetes Mother   . Heart disease Mother   . Hyperlipidemia Mother   . Hypertension Mother   . Heart attack Father 38  . Hypertension Father   . Hypertension Sister   . Diabetes Sister   . Other Brother        house fire  . Hypertension Brother   . Seizures Brother   . Heart disease Brother        mitral valve  . Arthritis Daughter        DJD, AS fibromyalgia  . Polymyositis Daughter   . Cancer Maternal Grandfather        lung  . Heart disease Maternal Grandfather   .  Other Paternal Grandmother        house fire  . Alcohol abuse Son     ALLERGIES:  is allergic to darvon [propoxyphene], oxycodone-acetaminophen, and penicillins.  MEDICATIONS:  Current Outpatient Medications  Medication Sig Dispense Refill  . atorvastatin (LIPITOR) 10 MG tablet Take 1 tablet (10 mg total) by mouth daily. 90 tablet 1  . baclofen (LIORESAL) 10 MG tablet Take 1 tablet (10 mg  total) by mouth 3 (three) times daily. 90 each 11  . celecoxib (CELEBREX) 200 MG capsule Take 1 capsule (200 mg total) by mouth every 12 (twelve) hours. 180 capsule 1  . Cholecalciferol 50 MCG (2000 UT) TABS Take 2,000 Int'l Units by mouth daily.    . diazepam (VALIUM) 5 MG tablet TAKE (1) TABLET BY MOUTH EVERY SIX HOURS AS NEEDED FOR MUSCLE SPASMS. At least 3x/day- need to take it. For spasms 90 tablet 5  . gabapentin (NEURONTIN) 300 MG capsule Take 2 capsules (600 mg total) by mouth 3 (three) times daily. 180 capsule 1  . ibuprofen (ADVIL) 800 MG tablet TAKE (1) TABLET BY MOUTH DAILY AS NEEDED. 30 tablet 0  . traMADol (ULTRAM) 50 MG tablet Take 2 tablets (100 mg total) by mouth every 6 (six) hours as needed for moderate pain. 60 tablet 3   No current facility-administered medications for this visit.    REVIEW OF SYSTEMS:   Review of Systems  Constitutional: Positive for malaise/fatigue. Negative for chills, fever and weight loss.  HENT: Negative for congestion, ear pain and tinnitus.   Eyes: Negative.  Negative for blurred vision and double vision.  Respiratory: Negative.  Negative for cough, sputum production and shortness of breath.   Cardiovascular: Negative.  Negative for chest pain, palpitations and leg swelling.  Gastrointestinal: Negative.  Negative for abdominal pain, constipation, diarrhea, nausea and vomiting.  Genitourinary: Negative for dysuria, frequency and urgency.  Musculoskeletal: Positive for myalgias. Negative for back pain and falls.  Skin: Negative.  Negative for rash.   Neurological: Positive for dizziness and weakness. Negative for headaches.  Endo/Heme/Allergies: Negative.  Does not bruise/bleed easily.  Psychiatric/Behavioral: Negative for depression. The patient has insomnia. The patient is not nervous/anxious.      PHYSICAL EXAMINATION: ECOG PERFORMANCE STATUS: 2 - Symptomatic, <50% confined to bed  Vitals:   12/13/19 1313  BP: 139/83  Pulse: 76  Resp: 16  SpO2: 96%   Filed Weights   12/13/19 1313  Weight: 97 lb 14.2 oz (44.4 kg)    Physical Exam Constitutional:      Appearance: Normal appearance.     Comments: In motorized wheelchair  HENT:     Head: Normocephalic and atraumatic.  Eyes:     Pupils: Pupils are equal, round, and reactive to light.  Cardiovascular:     Rate and Rhythm: Normal rate and regular rhythm.     Heart sounds: Normal heart sounds. No murmur heard.   Pulmonary:     Effort: Pulmonary effort is normal.     Breath sounds: Normal breath sounds. No wheezing.  Abdominal:     General: Bowel sounds are normal. There is no distension.     Palpations: Abdomen is soft.     Tenderness: There is no abdominal tenderness.  Musculoskeletal:        General: Normal range of motion.     Cervical back: Normal range of motion.  Skin:    General: Skin is warm and dry.     Findings: No rash.  Neurological:     Mental Status: She is alert and oriented to person, place, and time.  Psychiatric:        Judgment: Judgment normal.     LABORATORY DATA:  I have reviewed the data as listed I have reviewed labs from Dr. Lanice Shirts office.  RADIOGRAPHIC STUDIES: I have personally reviewed the radiological images as listed and agreed with the findings in the report.  ASSESSMENT & PLAN:  Erythrocytosis 1.  Erythrocytosis: - CBC on 03/27/2018 at Dr. Lanice Shirts office showed elevated hemoglobin of 16.4 and hematocrit of 46.  RBC count was normal. -Repeat CBC on 04/25/2018 shows hemoglobin 16.2 and hematocrit of 45.5.  She is not on  any diuretics. - Current active smoker.  Denies any vasomotor symptoms or erythromelalgias.  No aquagenic pruritus. -No B symptoms.  Physical examination today did not reveal any splenomegaly or lymphadenopathy. -Likely etiology is smoking-related. -Repeat CBC (hemoglobin 17.0), troponin and LDH were stable/baseline.  UA was negative for hematuria.  JAK2 negative. Lab Results  Component Value Date   WBC 9.2 12/04/2019   HGB 16.1 (H) 12/04/2019   HCT 46.8 (H) 12/04/2019   MCV 97.1 12/04/2019   PLT 254 12/04/2019   -Hemoglobin has fluctuated over the past year ranging from normal to 17. -Given JAK2 was negative, this is likely secondary to smoking.  We will continue to monitor at this time.  2.  Smoking history: -She currently smokes 5 cigarettes/day for the last 5 years.  She has a history of 1 pack/day for 35 years. - Low-dose CT lung cancer screening protocol shows lung RADS 2.  Left adrenal adenoma.  Emphysema.  She is past due for her CT scan.  Will reach out to nurse navigator to see if she is agreeable to being scheduled.  3.  Multiple sclerosis: - Usual symptoms are weakness and falls. -Has been on Tysabri which was discontinued secondary to insurance.  She was switched to [place] and was unable to tolerate (fevers and dizziness).  She is waiting on approval from her insurance company to restart Tysabri.  She is scheduled to see her neurologist soon.  4.  Osteopenia: -DEXA scan on 09/30/2016 shows T score of -2.2. - She is taking vitamin D 2000 units daily.  5.  Health maintenance: - She never had a colonoscopy.  She apparently was given Cologuard which she has not completed. -Recommend mammogram.   Greater than 50% was spent in counseling and coordination of care with this patient including but not limited to discussion of the relevant topics above (See A&P) including, but not limited to diagnosis and management of acute and chronic medical conditions.    All questions were  answered. The patient knows to call the clinic with any problems, questions or concerns.      Jacquelin Hawking, NP 12/14/19 10:06 AM

## 2019-12-13 NOTE — Assessment & Plan Note (Addendum)
1.  Erythrocytosis: - CBC on 03/27/2018 at Dr. Lanice Shirts office showed elevated hemoglobin of 16.4 and hematocrit of 46.  RBC count was normal. -Repeat CBC on 04/25/2018 shows hemoglobin 16.2 and hematocrit of 45.5.  She is not on any diuretics. - Current active smoker.  Denies any vasomotor symptoms or erythromelalgias.  No aquagenic pruritus. -No B symptoms.  Physical examination today did not reveal any splenomegaly or lymphadenopathy. -Likely etiology is smoking-related. -Repeat CBC (hemoglobin 17.0), troponin and LDH were stable/baseline.  UA was negative for hematuria.  JAK2 negative. Lab Results  Component Value Date   WBC 9.2 12/04/2019   HGB 16.1 (H) 12/04/2019   HCT 46.8 (H) 12/04/2019   MCV 97.1 12/04/2019   PLT 254 12/04/2019   -Hemoglobin has fluctuated over the past year ranging from normal to 17. -Given JAK2 was negative, this is likely secondary to smoking.  We will continue to monitor at this time.  2.  Smoking history: -She currently smokes 5 cigarettes/day for the last 5 years.  She has a history of 1 pack/day for 35 years. - Low-dose CT lung cancer screening protocol shows lung RADS 2.  Left adrenal adenoma.  Emphysema.  She is past due for her CT scan.  Will reach out to nurse navigator to see if she is agreeable to being scheduled.  3.  Multiple sclerosis: - Usual symptoms are weakness and falls. -Has been on Tysabri which was discontinued secondary to insurance.  She was switched to [place] and was unable to tolerate (fevers and dizziness).  She is waiting on approval from her insurance company to restart Tysabri.  She is scheduled to see her neurologist soon.  4.  Osteopenia: -DEXA scan on 09/30/2016 shows T score of -2.2. - She is taking vitamin D 2000 units daily.  5.  Health maintenance: - She never had a colonoscopy.  She apparently was given Cologuard which she has not completed. -Recommend mammogram.

## 2019-12-14 ENCOUNTER — Encounter (HOSPITAL_COMMUNITY): Payer: Self-pay

## 2019-12-14 NOTE — Progress Notes (Signed)
Patient referred to the Lung Cancer Screening program. Patient had SDMV in February 2020 with initial scan that month arranged by PCP. I have called and left a detailed VM asking that the patient call me back to arrange follow-up

## 2019-12-18 ENCOUNTER — Encounter (HOSPITAL_COMMUNITY): Payer: Self-pay

## 2019-12-18 ENCOUNTER — Other Ambulatory Visit (HOSPITAL_COMMUNITY): Payer: Self-pay

## 2019-12-18 DIAGNOSIS — R261 Paralytic gait: Secondary | ICD-10-CM | POA: Diagnosis not present

## 2019-12-18 DIAGNOSIS — Z79899 Other long term (current) drug therapy: Secondary | ICD-10-CM | POA: Diagnosis not present

## 2019-12-18 DIAGNOSIS — G250 Essential tremor: Secondary | ICD-10-CM | POA: Diagnosis not present

## 2019-12-18 DIAGNOSIS — G35 Multiple sclerosis: Secondary | ICD-10-CM | POA: Diagnosis not present

## 2019-12-18 DIAGNOSIS — I10 Essential (primary) hypertension: Secondary | ICD-10-CM | POA: Diagnosis not present

## 2019-12-18 DIAGNOSIS — G894 Chronic pain syndrome: Secondary | ICD-10-CM | POA: Diagnosis not present

## 2019-12-18 DIAGNOSIS — Z87891 Personal history of nicotine dependence: Secondary | ICD-10-CM

## 2019-12-18 DIAGNOSIS — Z122 Encounter for screening for malignant neoplasm of respiratory organs: Secondary | ICD-10-CM

## 2019-12-18 NOTE — Progress Notes (Signed)
LDCT scheduled for 01/09/2020 at 4p. Patient's daughter made aware at the patient's request.

## 2019-12-18 NOTE — Progress Notes (Signed)
Orders placed for LDCT 

## 2019-12-24 ENCOUNTER — Encounter
Payer: Medicaid Other | Attending: Physical Medicine and Rehabilitation | Admitting: Physical Medicine and Rehabilitation

## 2019-12-24 ENCOUNTER — Other Ambulatory Visit: Payer: Self-pay

## 2019-12-24 ENCOUNTER — Encounter: Payer: Self-pay | Admitting: Physical Medicine and Rehabilitation

## 2019-12-24 VITALS — BP 151/94 | HR 92 | Temp 98.3°F | Ht 64.0 in | Wt 97.0 lb

## 2019-12-24 DIAGNOSIS — R296 Repeated falls: Secondary | ICD-10-CM | POA: Insufficient documentation

## 2019-12-24 DIAGNOSIS — M4710 Other spondylosis with myelopathy, site unspecified: Secondary | ICD-10-CM | POA: Diagnosis not present

## 2019-12-24 DIAGNOSIS — Z720 Tobacco use: Secondary | ICD-10-CM | POA: Insufficient documentation

## 2019-12-24 DIAGNOSIS — Z993 Dependence on wheelchair: Secondary | ICD-10-CM | POA: Diagnosis not present

## 2019-12-24 DIAGNOSIS — G35 Multiple sclerosis: Secondary | ICD-10-CM | POA: Insufficient documentation

## 2019-12-24 DIAGNOSIS — Z716 Tobacco abuse counseling: Secondary | ICD-10-CM | POA: Insufficient documentation

## 2019-12-24 NOTE — Progress Notes (Signed)
Subjective:    Patient ID: Faith [name], female    DOB: 1955/08/17, 64 y.o.   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 No cervical brace required- here for f/u.  Lost circulation in L leg- only has ~ 20% circulation in L foot-  No wounds so far in leg.    Trying to stop smoking now-  Was smokes 3-5 cigarettes/day prior.  Now smoking ~ 3 cigarettes/day.    Lost her H/H aide temporarily-  Her  (grandson's mother, actually)- called the HHA a very bad word- racially motivated word- daughter gave her a bath this AM so got her hair washed.   Was 6 weeks ago.    Daughter comes 1x/week and makes up her pill box tray-  Taking them as prescribed now.   No side effects.  Thinks spasms, tightness are under better control.   Still difficulty eating and using hands- shakes real bad.  Because shakes so bad, tries to put food close to mouth.  Occ legs were go to jumping for no apparent reason.   Back to dragging L leg- when tries to use RW to walk/transfer.   Hasn't had another MRI of neck since surgery- Insurance was asking- explained we don't usually do it, unless someone getting worse.   Starting up MS infusions again- sometime soon- getting paperwork figured out.     Pain Inventory Average Pain 5 Pain Right Now 5 My pain is constant and dull  LOCATION OF PAIN  - back of neck & down back  BOWEL Number of stools per week: 7 Oral laxative use None Type of laxative none Enema or suppository use No  History of colostomy No  Incontinent No   BLADDER Normal In and out cath, frequency N/A Able to self cath N/A Bladder incontinence No  Frequent urination No  Leakage with coughing Yes  Difficulty starting stream No  Incomplete  bladder emptying No    Mobility how many minutes can you walk? Only at home. ability to climb steps?  no do you drive?  no use a wheelchair  Function disabled: date disabled Many be 2017  I need assistance with the following:  feeding, dressing, bathing, toileting, meal prep, household duties and shopping Do you have any goals in this area?  yes  Neuro/Psych weakness numbness tremor trouble walking spasms  Prior Studies Dr. Read Drivers check lower limbs recenlty.  Physicians involved in your care Any changes since last visit?  no   Family History  Problem Relation Age of Onset  . Heart attack Mother   . Kidney failure Mother   . Diabetes Mother   . Heart disease Mother   . Hyperlipidemia Mother   . Hypertension Mother   . Heart attack Father 74  . Hypertension Father   . Hypertension Sister   . Diabetes Sister   . Other Brother        house fire  . Hypertension Brother   . Seizures Brother   . Heart disease Brother        mitral valve  . Arthritis Daughter        DJD, AS fibromyalgia  . Polymyositis Daughter   . Cancer Maternal Grandfather        lung  . Heart disease Maternal Grandfather   .  Other Paternal Grandmother        house fire  . Alcohol abuse Son    Social History   Socioeconomic History  . Marital status: Legally Separated    Spouse name: Not on file  . Number of children: 3  . Years of education: 25  . Highest education level: Not on file  Occupational History  . Occupation: disabled    Comment: house painting  Tobacco Use  . Smoking status: Current Every Day Smoker    Packs/day: 0.25    Years: 45.00    Pack years: 11.25  . Smokeless tobacco: Never Used  . Tobacco comment: cutting down  Vaping Use  . Vaping Use: Never used  Substance and Sexual Activity  . Alcohol use: Yes    Alcohol/week: 2.0 standard drinks    Types: 2 Cans of beer per week  . Drug use: No  . Sexual activity: Not Currently    Birth control/protection:  Post-menopausal  Other Topics Concern  . Not on file  Social History Narrative   Patient lives with her son. [name], age 46, frequently visits   Social Determinants of Health   Financial Resource Strain:   . Difficulty of Paying Living Expenses: Not on file  Food Insecurity:   . Worried About Charity fundraiser in the Last Year: Not on file  . Ran Out of Food in the Last Year: Not on file  Transportation Needs:   . Lack of Transportation (Medical): Not on file  . Lack of Transportation (Non-Medical): Not on file  Physical Activity:   . Days of Exercise per Week: Not on file  . Minutes of Exercise per Session: Not on file  Stress:   . Feeling of Stress : Not on file  Social Connections:   . Frequency of Communication with Friends and Family: Not on file  . Frequency of Social Gatherings with Friends and Family: Not on file  . Attends Religious Services: Not on file  . Active Member of Clubs or Organizations: Not on file  . Attends Archivist Meetings: Not on file  . Marital Status: Not on file   Past Surgical History:  Procedure Laterality Date  . ANTERIOR CERVICAL DECOMP/DISCECTOMY FUSION N/A 01/19/2019   Procedure: ANTERIOR CERVICAL DECOMPRESSION/DISCECTOMY Cervical three - four;  Surgeon: Ashok Pall, MD;  Location: [facility];  Service: Neurosurgery;  Laterality: N/A;  . EYE SURGERY Right   . ORIF FACIAL FRACTURE    . POSTERIOR CERVICAL LAMINECTOMY N/A 01/19/2019   Procedure: POSTERIOR CERVICAL LAMINECTOMY Removal of Synovial Cyst and posterior cervical fusion;  Surgeon: Ashok Pall, MD;  Location: [place];  Service: Neurosurgery;  Laterality: N/A;  . TUBAL LIGATION    . tubaligation     Past Medical History:  Diagnosis Date  . Anemia    my whole life, Used to take iron  . Arthritis    spine, lumbar  . COPD (chronic obstructive pulmonary disease) ([place])   . Emphysema of lung ([place])   . Hyperlipidemia 11/28/2019  . Narcolepsy    by history, has  nightmares  . Neuromuscular disorder ([place])    leg issues from spine   BP (!) 151/94   Pulse 92   Temp 98.3 F (36.8 C)   Ht 5\' 4"  (1.626 m)   Wt 97 lb (44 kg)   BMI 16.65 kg/m   Opioid Risk Score:   Fall Risk Score:  `1  Depression screen PHQ 2/9  Depression screen [facility] 2/9  09/24/2019 09/20/2019 07/03/2019 12/14/2016 09/07/2016 06/08/2016 01/14/2016  Decreased Interest 0 0 0 0 0 0 0  Down, Depressed, Hopeless 0 0 0 0 0 0 0  PHQ - 2 Score 0 0 0 0 0 0 0   Review of Systems  Constitutional: Negative.   HENT: Negative.   Eyes: Negative.   Respiratory: Negative.   Cardiovascular: Negative.   Gastrointestinal: Negative.   Endocrine: Negative.   Genitourinary: Negative.   Musculoskeletal: Positive for back pain, gait problem and neck pain.  Skin: Negative.   Allergic/Immunologic: Negative.   Neurological: Positive for weakness and numbness.  All other systems reviewed and are negative.      Objective:   Physical Exam  Awake, alert, appropriate, in power w/c, controller on R, NAD Ecchymoses on UEs- mild forearm swelling noted Notable tremor in RUE>LUE esp with movement/intention MS: Deltoids 4+/5, biceps 4+/5, triceps 4+/5, WE 4+/5, grip 4/5, finger abd 3/5 on R; 3+/5 on L RLE- HF 4/5, KE 4/5, DF 4/5, PF 4/5 LLE- HF 4-/5, KE 4-/5, DF 3+/5, PF 3+/5 Cooler LLE than RLE- both a little cool Neuro: MAS of 1 in UEs and 0 in LEs- much looser- a few spasms seen as well as tremors.      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 No cervical brace required- here for f/u.   1. Discussed that needs to stop smoking- since 1 cigarette decreases blood flow by 50% to legs for 4-6 hours after smoking. If don't stop smoking, could lose leg -  stopping smoking could prevent it! Wants to do to do on her own. Won't try medicines.   2. Suggest following up with Neurosurgery since never really did- might need f/u cervical MRI. Weak in LLE as well as grip/finger abduction - Dr Ashok Pall- [phone] to make appointment  3. Having cold Symptoms- can take Robitussin- cough, mucinex- phlegm, stuffy- can try a little benadryl- 1/2 a pill.  4. Con't Diazepam and Baclofen. Call when needs refills.   5. Have measured for permanent ramp and grab bars. Continue that work :)    6. F/U in 2 months   I spent a total of 30 minutes on visit- as detailed above. Esp working on smoking cessation in light of poor circulation of LLE.

## 2019-12-24 NOTE — Patient Instructions (Signed)
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 No cervical brace required- here for f/u.   1. Discussed that needs to stop smoking- since 1 cigarette decreases blood flow by 50% to legs for 4-6 hours after smoking. If don't stop smoking, could lose leg - stopping smoking could prevent it! Thinks can do on own, so won't try medicines.    2. Suggest following up with Neurosurgery since never really did- might need f/u cervical MRI. Weak in LLE as well as grip/finger abduction - Dr Ashok Pall- [phone] to make appointment  3. Having cold Symptoms- can take Robitussin- cough, mucinex- phlegm, stuffy- can try a little benadryl- 1/2 a pill.  4. Con't Diazepam and Baclofen. Call when needs refills.   5. Have measured for permanent ramp and grab bars. Continue that work :)    6. F/U in 2 months

## 2020-01-09 ENCOUNTER — Ambulatory Visit (HOSPITAL_COMMUNITY)
Admission: RE | Admit: 2020-01-09 | Discharge: 2020-01-09 | Disposition: A | Discharge status: Home / Self Care | Payer: Medicaid Other | Source: Ambulatory Visit | Attending: Oncology | Admitting: Oncology

## 2020-01-09 ENCOUNTER — Other Ambulatory Visit: Payer: Self-pay

## 2020-01-09 DIAGNOSIS — Z87891 Personal history of nicotine dependence: Secondary | ICD-10-CM | POA: Insufficient documentation

## 2020-01-09 DIAGNOSIS — Z122 Encounter for screening for malignant neoplasm of respiratory organs: Secondary | ICD-10-CM | POA: Insufficient documentation

## 2020-01-10 ENCOUNTER — Encounter (HOSPITAL_COMMUNITY): Payer: Self-pay

## 2020-01-10 NOTE — Progress Notes (Signed)
Patient notified via mail of LDCT lung cancer screening results with recommendations to follow up in 12 months. Also notified of incidental findings and need to follow up with PCP. Patient's referring provider was sent a copy of results. Results are as follows:  IMPRESSION: 1. Lung-RADS 2, benign appearance or behavior. Continue annual screening with low-dose chest CT without contrast in 12 months. 2. Emphysema. 3. Aortic Atherosclerosis (ICD10-I70.0) and Emphysema (ICD10-J43.9).

## 2020-01-15 ENCOUNTER — Telehealth: Payer: Self-pay | Admitting: Physical Medicine and Rehabilitation

## 2020-01-15 NOTE — Telephone Encounter (Signed)
Patient's daughter wanted to let Dr. Dagoberto Ligas know she has left several messages with Dr. Lacy Duverney office with no return call about an appt.  I called his office and got her scheduled on 02/07/20 at 10:15, Caren Griffins did say she returned phone call but voicemail was full.  Daughter is aware of appt with Dr. Christella Noa.

## 2020-01-30 ENCOUNTER — Ambulatory Visit (INDEPENDENT_AMBULATORY_CARE_PROVIDER_SITE_OTHER): Payer: Medicaid Other | Admitting: Nurse Practitioner

## 2020-01-30 ENCOUNTER — Other Ambulatory Visit: Payer: Self-pay

## 2020-01-30 ENCOUNTER — Encounter (INDEPENDENT_AMBULATORY_CARE_PROVIDER_SITE_OTHER): Payer: Self-pay | Admitting: Nurse Practitioner

## 2020-01-30 VITALS — BP 128/80 | HR 70 | Temp 97.1°F | Resp 18

## 2020-01-30 DIAGNOSIS — I739 Peripheral vascular disease, unspecified: Secondary | ICD-10-CM | POA: Diagnosis not present

## 2020-01-30 DIAGNOSIS — E559 Vitamin D deficiency, unspecified: Secondary | ICD-10-CM

## 2020-01-30 DIAGNOSIS — I700 Atherosclerosis of aorta: Secondary | ICD-10-CM

## 2020-01-30 DIAGNOSIS — E785 Hyperlipidemia, unspecified: Secondary | ICD-10-CM

## 2020-01-30 DIAGNOSIS — D751 Secondary polycythemia: Secondary | ICD-10-CM | POA: Diagnosis not present

## 2020-01-30 MED ORDER — ATORVASTATIN CALCIUM 20 MG PO TABS: 20.0000 mg | ORAL_TABLET | Freq: Every day | ORAL | 0 refills | Status: DC

## 2020-01-30 NOTE — Progress Notes (Signed)
Subjective:  Patient ID: Faith [name], female    DOB: Mar 11, 1955  Age: 64 y.o. MRN: [id number]  CC:  Chief Complaint  Patient presents with  . Other    Erythrocytosis, aortic atherosclerosis, vitamin D deficiency, color changes to fingers on her left hand      HPI  This patient arrives today for the above.  Erythrocytosis: It has been noted that she has been having elevated hemoglobin in the past.  She has been evaluated by oncology and after going through work-up it is determined that the elevated hemoglobin is most likely related to her smoking.  She has been trying to cut back her smoking even more.  She tells me she normally smokes anywhere between 3 to 6 cigarettes a day, however she has had a couple of days where she is only smoked 2 cigarettes, and continues to try to cut back.  Aortic atherosclerosis: She does have a history of aortic atherosclerosis.  She is on atorvastatin 10 mg daily.  Last lipid panel showed LDL 97.  Vitamin D deficiency: She continues on 2000 IUs of vitamin D3 daily.  Last serum check was 58.  Color changes to fingers on her left hand: She tells me she has noted some intermittent "whitening" of the bases of her third and fourth digit to her left hand.  She denies any associated pain or known triggers.  She tells me that the color changes will spontaneously resolve.  She does have a history of peripheral arterial disease and does follow-up with Dr. Donnetta Hutching.  She is scheduled to see him later this month.  Past Medical History:  Diagnosis Date  . Anemia    my whole life, Used to take iron  . Arthritis    spine, lumbar  . COPD (chronic obstructive pulmonary disease) ([place])   . Emphysema of lung ([place])   . Hyperlipidemia 11/28/2019  . Narcolepsy    by history, has nightmares  . Neuromuscular disorder ([place])    leg issues from spine      Family History  Problem Relation Age of Onset  . Heart attack Mother   . Kidney failure Mother   . Diabetes  Mother   . Heart disease Mother   . Hyperlipidemia Mother   . Hypertension Mother   . Heart attack Father 9  . Hypertension Father   . Hypertension Sister   . Diabetes Sister   . Other Brother        house fire  . Hypertension Brother   . Seizures Brother   . Heart disease Brother        mitral valve  . Arthritis Daughter        DJD, AS fibromyalgia  . Polymyositis Daughter   . Cancer Maternal Grandfather        lung  . Heart disease Maternal Grandfather   . Other Paternal Grandmother        house fire  . Alcohol abuse Son     Social History   Social History Narrative   Patient lives with her son. [name], age 32, frequently visits   Social History   Tobacco Use  . Smoking status: Current Every Day Smoker    Packs/day: 0.25    Years: 45.00    Pack years: 11.25  . Smokeless tobacco: Never Used  . Tobacco comment: cutting down  Substance Use Topics  . Alcohol use: Yes    Alcohol/week: 2.0 standard drinks    Types: 2  Cans of beer per week     No outpatient medications have been marked as taking for the 01/30/20 encounter (Office Visit) with Ailene Ards, NP.    ROS:  Review of Systems  Constitutional: Negative for malaise/fatigue.  Respiratory: Negative for shortness of breath.   Cardiovascular: Negative for chest pain.  Neurological: Positive for tremors. Negative for dizziness and headaches.     Objective:   Today's Vitals: BP 128/80 (BP Location: Right Arm, Patient Position: Sitting, Cuff Size: Normal)   Pulse 70   Temp (!) 97.1 F (36.2 C) (Temporal)   Resp 18   SpO2 96%  Vitals with BMI 01/30/2020 12/24/2019 12/13/2019  Height - 5\' 4"  -  Weight - 97 lbs 97 lbs 14 oz  BMI - 19.37 90.24  Systolic 097 353 299  Diastolic 80 94 83  Pulse 70 92 76     Physical Exam Vitals reviewed.  Constitutional:      General: She is not in acute distress.    Appearance: Normal appearance.  HENT:     Head: Normocephalic and atraumatic.  Neck:      Vascular: No carotid bruit.  Cardiovascular:     Rate and Rhythm: Normal rate and regular rhythm.     Pulses: Normal pulses.     Heart sounds: Normal heart sounds.  Pulmonary:     Effort: Pulmonary effort is normal.     Breath sounds: Normal breath sounds.  Skin:    General: Skin is warm and dry.  Neurological:     General: No focal deficit present.     Mental Status: She is alert and oriented to person, place, and time.  Psychiatric:        Mood and Affect: Mood normal.        Behavior: Behavior normal.        Judgment: Judgment normal.          Assessment and Plan   1. PVD (peripheral vascular disease) ([place])   2. Aortic atherosclerosis ([place])   3. Vitamin D deficiency   4. Hyperlipidemia, unspecified hyperlipidemia type   5. Erythrocytosis      Plan: 1.  Color changes not present today, patient has good capillary refill.  Encouraged her to discuss this with Dr. Donnetta Hutching when she sees him later on this month.  We did discuss red flag symptoms limb ischemia including color change especially if it is dark purple/black and acute onset of pain to her hand.  I told her if she is concerned about that she needs to follow-up in the emergency department immediately.  She tells me she understands. 2., 4.  Had a benefits versus risk discussion regarding increasing her dose of atorvastatin.  She is agreeable to trying an increased dose of atorvastatin.  We will increase from 10 to 20 mg daily.  She will follow-up in 6 weeks to check blood work and see how she is tolerating.  She was notified to let me know if she has any negative side effects such as muscle aches or pains. 3.  We will consider checking vitamin D level at next office visit.  For now she will continue on her supplement. 5.  We will continue to monitor and encourage her to quit smoking.   Tests ordered No orders of the defined types were placed in this encounter.     Meds ordered this encounter  Medications  .  atorvastatin (LIPITOR) 20 MG tablet    Sig: Take 1 tablet (20 mg  total) by mouth daily.    Dispense:  90 tablet    Refill:  0    Deliver to patient's home    Order Specific Question:   Supervising Provider    Answer:   Doree Albee [[id number]]    Patient to follow-up in 6 weeks or sooner as needed.  Ailene Ards, NP

## 2020-02-04 DIAGNOSIS — M6281 Muscle weakness (generalized): Secondary | ICD-10-CM | POA: Diagnosis not present

## 2020-02-06 DIAGNOSIS — M6281 Muscle weakness (generalized): Secondary | ICD-10-CM | POA: Diagnosis not present

## 2020-02-07 ENCOUNTER — Telehealth: Payer: Self-pay | Admitting: Physical Medicine and Rehabilitation

## 2020-02-07 DIAGNOSIS — R030 Elevated blood-pressure reading, without diagnosis of hypertension: Secondary | ICD-10-CM | POA: Diagnosis not present

## 2020-02-07 DIAGNOSIS — M4712 Other spondylosis with myelopathy, cervical region: Secondary | ICD-10-CM | POA: Diagnosis not present

## 2020-02-07 DIAGNOSIS — M4722 Other spondylosis with radiculopathy, cervical region: Secondary | ICD-10-CM | POA: Diagnosis not present

## 2020-02-07 NOTE — Telephone Encounter (Signed)
Dr. Ashok Pall needs a call back from you as to why the patient was sent back to him.  He stated patient is in his office now.  Please call him at [phone].

## 2020-02-08 ENCOUNTER — Other Ambulatory Visit: Payer: Self-pay

## 2020-02-08 ENCOUNTER — Encounter
Payer: Medicaid Other | Attending: Physical Medicine and Rehabilitation | Admitting: Physical Medicine and Rehabilitation

## 2020-02-08 ENCOUNTER — Encounter: Payer: Self-pay | Admitting: Physical Medicine and Rehabilitation

## 2020-02-08 VITALS — BP 129/84 | HR 78 | Temp 98.1°F | Ht 64.0 in | Wt 101.4 lb

## 2020-02-08 DIAGNOSIS — G959 Disease of spinal cord, unspecified: Secondary | ICD-10-CM

## 2020-02-08 DIAGNOSIS — L89312 Pressure ulcer of right buttock, stage 2: Secondary | ICD-10-CM | POA: Diagnosis not present

## 2020-02-08 DIAGNOSIS — G35 Multiple sclerosis: Secondary | ICD-10-CM

## 2020-02-08 DIAGNOSIS — M545 Low back pain, unspecified: Secondary | ICD-10-CM

## 2020-02-08 DIAGNOSIS — Z993 Dependence on wheelchair: Secondary | ICD-10-CM | POA: Diagnosis not present

## 2020-02-08 DIAGNOSIS — G8929 Other chronic pain: Secondary | ICD-10-CM

## 2020-02-08 DIAGNOSIS — M6281 Muscle weakness (generalized): Secondary | ICD-10-CM | POA: Diagnosis not present

## 2020-02-08 DIAGNOSIS — G35D Multiple sclerosis, unspecified: Secondary | ICD-10-CM

## 2020-02-08 DIAGNOSIS — R296 Repeated falls: Secondary | ICD-10-CM | POA: Diagnosis not present

## 2020-02-08 MED ORDER — DIAZEPAM 5 MG PO TABS
ORAL_TABLET | ORAL | 5 refills | Status: DC
Start: 2020-02-08 — End: 2020-08-26

## 2020-02-08 NOTE — Patient Instructions (Addendum)
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 No cervical brace required- here forf/u.   1.  Pressure ulcer- So , knead the gel into place until [place] from [place] calls early next week and gets her a ROHO cushion.  Called Deatra Ina about cushion- if needs Rx, will ask Corene Cornea to let me know.   2. Pressure ulcer-  DO pressure relief every 15 minutes-  Leans the power w/c all the way back for 1-2 minutes every 15 minutes.  Push up off manual w/c every 15 minutes when in manual w/c. If you don't, you will NOT heal pressure ulcer.    3. Get padded toilet seat- take a picture of R buttock wound before next appointment  4. Pressure ulcer- Will call me if needs a wound care referral!!! And then I will put in.   5. Chronic pain- con't tramadol from PCP.   6. Spasticity- Con't Baclofen and Diazepam for spasms. Refilled Diazepam 5mg  TID- 5 RFs and has 8 refills of Baclofen.    7. MS/Cervical myelopathy- Dr Arneta Cliche was ordering cervical MRI since pt is weaker on L side- if doesn't show anything- will need a brain MRI.   8. F/U in 2 months - double visit- MS/SCI  9. H/H eval and treat for PT- also added vertigo eval if possible.

## 2020-02-08 NOTE — Progress Notes (Signed)
Subjective:    Patient ID: Faith [name], female    DOB: 1955-10-02, 64 y.o.   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 No cervical brace required- here forf/u.  Even bought some nasty cheap cigarettes to help- didn't work- trying to cut down.  Smoking 3 cigarettes/day.   Dr Cyndy Freeze ordered an MRI- sounds like L cervical.  Also did an xray- hardware looks ok.   Wants some therapy- PT if could get her moving, to help with mobility.   Uses power w/c at home and for transfer ability and uses manual w/c in community due to lack of transportation.  And daughter can push- trouble with [facility] and [facility]- community transport.    L back pain - and LLE- dull pain- aching pain- still taking tramadol from PCP- takes 100 mg 2-3x/day right now- and is helpful.   Gabapentin still taking it- thinks is helpful.     Also still real weak on L side. No change.   Has bedsore on R buttocks- to right of her  2spots- using balm to help heal it- 3-4 inches diameter - not healing as well as should- painful for her- and other spot  Dime sized- has gotten scab so far.   Cushion is becoming worn in power w/c- j2 gel cushion  When turns head to R, gets dizzy, when turns to L not dizzy.    Pain Inventory Average Pain 5 Pain Right Now 5 My pain is intermittent and dull  LOCATION OF PAIN  Left side of body for neck to foot  BOWEL Number of stools per week: 7 Oral laxative use No  Type of laxative None Enema or suppository use No  History of colostomy No  Incontinent No   BLADDER Pads In and out cath, frequency N/A Able to self cath N/A Bladder incontinence Yes  Frequent urination No  Leakage with coughing No  Difficulty starting stream No   Incomplete bladder emptying No    Mobility walk with assistance ability to climb steps?  no do you drive?  no use a wheelchair transfers alone Do you have any goals in this area?  yes  Function retired I need assistance with the following:  feeding, dressing, bathing, toileting, meal prep, household duties and shopping Do you have any goals in this area?  yes  Neuro/Psych weakness numbness tremor tingling trouble walking dizziness  Prior Studies  By Mena Goes with [facility] also Dr. Donnetta Hutching with [facility]  Physicians involved in your care Any changes since last visit?  no   Family History  Problem Relation Age of Onset  . Heart attack Mother   . Kidney failure Mother   . Diabetes Mother   . Heart disease Mother   . Hyperlipidemia Mother   . Hypertension Mother   . Heart attack Father 24  . Hypertension Father   . Hypertension Sister   . Diabetes Sister   . Other Brother        house fire  . Hypertension Brother   . Seizures Brother   . Heart disease Brother        mitral valve  . Arthritis Daughter        DJD, AS fibromyalgia  . Polymyositis Daughter   . Cancer Maternal Grandfather  lung  . Heart disease Maternal Grandfather   . Other Paternal Grandmother        house fire  . Alcohol abuse Son    Social History   Socioeconomic History  . Marital status: Legally Separated    Spouse name: Not on file  . Number of children: 3  . Years of education: 39  . Highest education level: Not on file  Occupational History  . Occupation: disabled    Comment: house painting  Tobacco Use  . Smoking status: Current Every Day Smoker    Packs/day: 0.25    Years: 45.00    Pack years: 11.25  . Smokeless tobacco: Never Used  . Tobacco comment: cutting down  Vaping Use  . Vaping Use: Never used  Substance and Sexual Activity  . Alcohol use: Yes    Alcohol/week: 2.0 standard drinks    Types: 2 Cans of beer per week  . Drug use: No  . Sexual  activity: Not Currently    Birth control/protection: Post-menopausal  Other Topics Concern  . Not on file  Social History Narrative   Patient lives with her son. [name], age 85, frequently visits   Social Determinants of Health   Financial Resource Strain: Not on file  Food Insecurity: Not on file  Transportation Needs: Not on file  Physical Activity: Not on file  Stress: Not on file  Social Connections: Not on file   Past Surgical History:  Procedure Laterality Date  . ANTERIOR CERVICAL DECOMP/DISCECTOMY FUSION N/A 01/19/2019   Procedure: ANTERIOR CERVICAL DECOMPRESSION/DISCECTOMY Cervical three - four;  Surgeon: Ashok Pall, MD;  Location: [facility];  Service: Neurosurgery;  Laterality: N/A;  . EYE SURGERY Right   . ORIF FACIAL FRACTURE    . POSTERIOR CERVICAL LAMINECTOMY N/A 01/19/2019   Procedure: POSTERIOR CERVICAL LAMINECTOMY Removal of Synovial Cyst and posterior cervical fusion;  Surgeon: Ashok Pall, MD;  Location: [facility];  Service: Neurosurgery;  Laterality: N/A;  . TUBAL LIGATION    . tubaligation     Past Medical History:  Diagnosis Date  . Anemia    my whole life, Used to take iron  . Arthritis    spine, lumbar  . COPD (chronic obstructive pulmonary disease) ([place])   . Emphysema of lung ([place])   . Hyperlipidemia 11/28/2019  . Narcolepsy    by history, has nightmares  . Neuromuscular disorder ([place])    leg issues from spine   There were no vitals taken for this visit.  Opioid Risk Score:   Fall Risk Score:  `1  Depression screen PHQ 2/9  Depression screen [facility] 2/9 12/24/2019 09/24/2019 09/20/2019 07/03/2019 12/14/2016 09/07/2016 06/08/2016  Decreased Interest 0 0 0 0 0 0 0  Down, Depressed, Hopeless 0 0 0 0 0 0 0  PHQ - 2 Score 0 0 0 0 0 0 0   Review of Systems  Genitourinary: Positive for frequency and urgency.  Musculoskeletal: Positive for gait problem.  Skin: Positive for wound.       Buttock on the right side  Neurological: Positive for  dizziness, weakness and numbness.  Hematological: Bruises/bleeds easily.  All other systems reviewed and are negative.      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 No cervical brace required- here forf/u.   1.  Pressure ulcer- So , knead the gel into place until [place] from [place] calls early next week and gets her a ROHO cushion.  Called Deatra Ina about cushion- if needs Rx, will ask Corene Cornea to let me know.   2. Pressure ulcer-  DO pressure relief every 15 minutes-  Leans the power w/c all the way back for 1-2 minutes every 15 minutes.  Push up off manual w/c every 15 minutes when in manual w/c. If you don't, you will NOT heal pressure ulcer.    3. Get padded toilet seat- take a picture of R buttock wound before next appointment- if still has it.   4. Pressure ulcer- Will call me if needs a wound care referral!!! And then I will put in.   5. Chronic pain- con't tramadol from PCP.   6. Spasticity- Con't Baclofen and Diazepam for spasms. Refilled Diazepam 5mg  TID- 5 RFs and has 8 refills of Baclofen.    7. MS/Cervical myelopathy- Dr Arneta Cliche was ordering cervical MRI since pt is weaker on L side- if doesn't show anything- will need a brain MRI.   8. F/U in 2 months - double visit- MS/SCI  9.  H/H eval and treat for PT- also added vertigo eval if possible.   I spent a total of 35 minutes on visit- as detailed above.

## 2020-02-11 DIAGNOSIS — M6281 Muscle weakness (generalized): Secondary | ICD-10-CM | POA: Diagnosis not present

## 2020-02-12 ENCOUNTER — Telehealth (INDEPENDENT_AMBULATORY_CARE_PROVIDER_SITE_OTHER): Payer: Self-pay

## 2020-02-12 ENCOUNTER — Other Ambulatory Visit (INDEPENDENT_AMBULATORY_CARE_PROVIDER_SITE_OTHER): Payer: Self-pay | Admitting: Internal Medicine

## 2020-02-12 NOTE — Telephone Encounter (Signed)
PCS form for home care was filled out & faxed back to [facility]. Copy will be sent to scan ctr for our records.

## 2020-02-13 DIAGNOSIS — M6281 Muscle weakness (generalized): Secondary | ICD-10-CM | POA: Diagnosis not present

## 2020-02-14 DIAGNOSIS — M6281 Muscle weakness (generalized): Secondary | ICD-10-CM | POA: Diagnosis not present

## 2020-02-15 DIAGNOSIS — M6281 Muscle weakness (generalized): Secondary | ICD-10-CM | POA: Diagnosis not present

## 2020-02-18 ENCOUNTER — Ambulatory Visit: Payer: Medicaid Other | Admitting: Vascular Surgery

## 2020-02-20 DIAGNOSIS — M6281 Muscle weakness (generalized): Secondary | ICD-10-CM | POA: Diagnosis not present

## 2020-02-21 DIAGNOSIS — M6281 Muscle weakness (generalized): Secondary | ICD-10-CM | POA: Diagnosis not present

## 2020-02-27 ENCOUNTER — Other Ambulatory Visit (HOSPITAL_COMMUNITY): Payer: Self-pay | Admitting: Neurosurgery

## 2020-02-27 ENCOUNTER — Other Ambulatory Visit (INDEPENDENT_AMBULATORY_CARE_PROVIDER_SITE_OTHER): Payer: Self-pay | Admitting: Internal Medicine

## 2020-02-27 ENCOUNTER — Other Ambulatory Visit: Payer: Self-pay | Admitting: Neurosurgery

## 2020-02-27 DIAGNOSIS — M4712 Other spondylosis with myelopathy, cervical region: Secondary | ICD-10-CM

## 2020-02-27 DIAGNOSIS — M6281 Muscle weakness (generalized): Secondary | ICD-10-CM | POA: Diagnosis not present

## 2020-02-29 ENCOUNTER — Inpatient Hospital Stay (HOSPITAL_COMMUNITY)
Admission: EM | Admit: 2020-02-29 | Discharge: 2020-03-05 | DRG: 058 | Disposition: A | Discharge status: Home Health | Payer: Medicaid Other | Attending: Family Medicine | Admitting: Family Medicine

## 2020-02-29 ENCOUNTER — Encounter (HOSPITAL_COMMUNITY): Payer: Self-pay | Admitting: Emergency Medicine

## 2020-02-29 DIAGNOSIS — R159 Full incontinence of feces: Secondary | ICD-10-CM | POA: Diagnosis present

## 2020-02-29 DIAGNOSIS — J449 Chronic obstructive pulmonary disease, unspecified: Secondary | ICD-10-CM

## 2020-02-29 DIAGNOSIS — Z880 Allergy status to penicillin: Secondary | ICD-10-CM | POA: Diagnosis not present

## 2020-02-29 DIAGNOSIS — I10 Essential (primary) hypertension: Secondary | ICD-10-CM | POA: Diagnosis present

## 2020-02-29 DIAGNOSIS — R9431 Abnormal electrocardiogram [ECG] [EKG]: Secondary | ICD-10-CM | POA: Diagnosis not present

## 2020-02-29 DIAGNOSIS — G35D Multiple sclerosis, unspecified: Secondary | ICD-10-CM

## 2020-02-29 DIAGNOSIS — Z9851 Tubal ligation status: Secondary | ICD-10-CM

## 2020-02-29 DIAGNOSIS — Z83438 Family history of other disorder of lipoprotein metabolism and other lipidemia: Secondary | ICD-10-CM | POA: Diagnosis not present

## 2020-02-29 DIAGNOSIS — Z79899 Other long term (current) drug therapy: Secondary | ICD-10-CM | POA: Diagnosis not present

## 2020-02-29 DIAGNOSIS — G0491 Myelitis, unspecified: Secondary | ICD-10-CM

## 2020-02-29 DIAGNOSIS — J439 Emphysema, unspecified: Secondary | ICD-10-CM | POA: Diagnosis present

## 2020-02-29 DIAGNOSIS — E785 Hyperlipidemia, unspecified: Secondary | ICD-10-CM | POA: Diagnosis not present

## 2020-02-29 DIAGNOSIS — F17210 Nicotine dependence, cigarettes, uncomplicated: Secondary | ICD-10-CM | POA: Diagnosis present

## 2020-02-29 DIAGNOSIS — G9520 Unspecified cord compression: Secondary | ICD-10-CM | POA: Diagnosis present

## 2020-02-29 DIAGNOSIS — R5381 Other malaise: Secondary | ICD-10-CM | POA: Diagnosis not present

## 2020-02-29 DIAGNOSIS — Z885 Allergy status to narcotic agent status: Secondary | ICD-10-CM

## 2020-02-29 DIAGNOSIS — G35 Multiple sclerosis: Secondary | ICD-10-CM | POA: Diagnosis not present

## 2020-02-29 DIAGNOSIS — G47419 Narcolepsy without cataplexy: Secondary | ICD-10-CM | POA: Diagnosis present

## 2020-02-29 DIAGNOSIS — I739 Peripheral vascular disease, unspecified: Secondary | ICD-10-CM | POA: Diagnosis present

## 2020-02-29 DIAGNOSIS — M1990 Unspecified osteoarthritis, unspecified site: Secondary | ICD-10-CM | POA: Diagnosis present

## 2020-02-29 DIAGNOSIS — E782 Mixed hyperlipidemia: Secondary | ICD-10-CM | POA: Diagnosis present

## 2020-02-29 DIAGNOSIS — L89312 Pressure ulcer of right buttock, stage 2: Secondary | ICD-10-CM | POA: Diagnosis not present

## 2020-02-29 DIAGNOSIS — M4802 Spinal stenosis, cervical region: Secondary | ICD-10-CM | POA: Diagnosis not present

## 2020-02-29 DIAGNOSIS — R292 Abnormal reflex: Secondary | ICD-10-CM | POA: Diagnosis present

## 2020-02-29 DIAGNOSIS — R29898 Other symptoms and signs involving the musculoskeletal system: Secondary | ICD-10-CM | POA: Diagnosis present

## 2020-02-29 DIAGNOSIS — R531 Weakness: Secondary | ICD-10-CM | POA: Diagnosis not present

## 2020-02-29 DIAGNOSIS — Z981 Arthrodesis status: Secondary | ICD-10-CM

## 2020-02-29 DIAGNOSIS — Z720 Tobacco use: Secondary | ICD-10-CM

## 2020-02-29 DIAGNOSIS — Z20822 Contact with and (suspected) exposure to covid-19: Secondary | ICD-10-CM | POA: Diagnosis not present

## 2020-02-29 LAB — CK: Total CK: 34 U/L — ABNORMAL LOW (ref 38–234)

## 2020-02-29 LAB — COMPREHENSIVE METABOLIC PANEL
ALT: 16 U/L (ref 0–44)
AST: 17 U/L (ref 15–41)
Albumin: 3.7 g/dL (ref 3.5–5.0)
Alkaline Phosphatase: 76 U/L (ref 38–126)
Anion gap: 8 (ref 5–15)
BUN: 12 mg/dL (ref 8–23)
CO2: 29 mmol/L (ref 22–32)
Calcium: 9.3 mg/dL (ref 8.9–10.3)
Chloride: 100 mmol/L (ref 98–111)
Creatinine, Ser: 0.53 mg/dL (ref 0.44–1.00)
GFR, Estimated: >60 mL/min (ref >60)
Glucose, Bld: 107 mg/dL — ABNORMAL HIGH (ref 70–99)
Potassium: 4 mmol/L (ref 3.5–5.1)
Sodium: 137 mmol/L (ref 135–145)
Total Bilirubin: 0.3 mg/dL (ref 0.3–1.2)
Total Protein: 6.7 g/dL (ref 6.5–8.1)

## 2020-02-29 LAB — CBC WITH DIFFERENTIAL/PLATELET
Abs Immature Granulocytes: 0.06 10*3/uL (ref 0.00–0.07)
Basophils Absolute: 0 10*3/uL (ref 0.0–0.1)
Basophils Relative: 0 %
Eosinophils Absolute: 0 10*3/uL (ref 0.0–0.5)
Eosinophils Relative: 0 %
HCT: 44.5 % (ref 36.0–46.0)
Hemoglobin: 15.1 g/dL — ABNORMAL HIGH (ref 12.0–15.0)
Immature Granulocytes: 1 %
Lymphocytes Relative: 20 %
Lymphs Abs: 2.2 10*3/uL (ref 0.7–4.0)
MCH: 33.1 pg (ref 26.0–34.0)
MCHC: 33.9 g/dL (ref 30.0–36.0)
MCV: 97.6 fL (ref 80.0–100.0)
Monocytes Absolute: 0.8 10*3/uL (ref 0.1–1.0)
Monocytes Relative: 7 %
Neutro Abs: 7.9 10*3/uL — ABNORMAL HIGH (ref 1.7–7.7)
Neutrophils Relative %: 72 %
Platelets: 277 10*3/uL (ref 150–400)
RBC: 4.56 MIL/uL (ref 3.87–5.11)
RDW: 13.2 % (ref 11.5–15.5)
WBC: 11 10*3/uL — ABNORMAL HIGH (ref 4.0–10.5)
nRBC: 0 % (ref 0.0–0.2)

## 2020-02-29 LAB — RESP PANEL BY RT-PCR (FLU A&B, COVID) ARPGX2
Influenza A by PCR: NEGATIVE
Influenza B by PCR: NEGATIVE
SARS Coronavirus 2 by RT PCR: NEGATIVE

## 2020-02-29 NOTE — ED Notes (Signed)
Report to Salvadore Oxford, RN, [employer]

## 2020-02-29 NOTE — ED Triage Notes (Signed)
Increased weakness for the past 3 days. Pt has hx of MS.  No difficulties eating or drinking.  Patient is a&o x 4

## 2020-02-29 NOTE — ED Provider Notes (Signed)
11:25 PM  Patient transferred from [facility]. Here for MRI of the brain and cervical spine with and without contrast. History of MS and previous cervical myelopathy status post laminectomy of the cervical spine in November 2020 by Dr. Franky Macho.   2:50 AM  Pt's MRI shows unchanged distribution of cerebral white matter lesions with no new active demyelinating lesions.  There is a focus of hyperintense T2 weighted signal in the right dorsal spinal cord at C4 that is more clearly visible on the current study that could be due to demyelination or prior cord compression.  No active demyelinating lesions in the cervical spine.  She is status post C3-C4 anterior fusion with a widely patent spinal canal.  She does have severe left C7 foraminal stenosis and moderate stenosis at left C3 and moderate stenosis at bilateral C6.  She is complaining of mostly leg weakness.  She reports he normally ambulates with a walker and does have an electric wheelchair at home but is normally able to pick her legs up.  She is unable to even pick her legs up off the bed more than an inch at this time.  She denies any sensory deficits.  Has no bowel or bladder incontinence.  No urinary retention.  No fever.  I am concerned that given this significant weakness that she needs MRIs of her thoracic and lumbar spine as well with and without contrast.  She states that she normally is able to transfer to her wheelchair on her own.  I do not see how she will be able to do this at home given her significant weakness.  Will discuss with hospitalist.   3:01 AM Discussed patient's case with hospitalist, Dr. Loney Loh.  I have recommended admission and patient (and family if present) agree with this plan. Admitting physician will place admission orders.   I reviewed all nursing notes, vitals, pertinent previous records and reviewed/interpreted all EKGs, lab and urine results, imaging (as available).  3:05 AM  D/w Dr. Derry Lory with neurology.  He  agrees that patient needs MRI of her thoracic and lumbar spine with and without contrast but now will have to wait at least 6 hours given she is already had contrast.  I will go and place these orders.  He will see patient in consultation.  Appreciate neurology help.   Tahja Liao, Layla Maw, DO 03/01/20 [phone]

## 2020-02-29 NOTE — ED Provider Notes (Addendum)
[facility] EMERGENCY DEPARTMENT Provider Note   CSN: [id number] Arrival date & time: 02/29/20  1706     History Chief Complaint  Patient presents with  . Weakness    Faith [name] is a 64 y.o. female.  HPI    64 year old female with history of COPD, multiple sclerosis with cervical myelopathy, cervical spine nerve impingement status post decompression last year comes in a chief complaint of weakness.  Patient reports that over the past few days she has had increased weakness in her legs and difficulty keeping her head up.  Today she could not get up to go to the bathroom, prompting her to come to the ER.  The weakness is limited to the lower extremity.  Her upper extremity strength has been normal.  She denies any associated numbness, tingling.  Patient denies any urinary incontinence, retention, bowel incontinence, and again there is no tingling sensation.   Patient is not taking any medications for MS at this time.  Past Medical History:  Diagnosis Date  . Anemia    my whole life, Used to take iron  . Arthritis    spine, lumbar  . COPD (chronic obstructive pulmonary disease) ([place])   . Emphysema of lung ([place])   . Hyperlipidemia 11/28/2019  . Narcolepsy    by history, has nightmares  . Neuromuscular disorder ([place])    leg issues from spine    Patient Active Problem List   Diagnosis Date Noted  . Pressure injury of right buttock, stage 2 ([place]) 02/08/2020  . PVD (peripheral vascular disease) ([place]) 11/28/2019  . Hyperlipidemia 11/28/2019  . Wheelchair dependence 09/24/2019  . Cervical myelopathy ([place]) 01/22/2019  . Spinal cord compression due to degenerative disorder of spinal column 01/20/2019  . Synovial cyst   . Spondylolisthesis, cervical region 01/19/2019  . Chronic back pain 01/18/2019  . Multiple sclerosis exacerbation ([place]) 01/17/2019  . Erythrocytosis 09/13/2018  . Vitamin D deficiency 03/09/2016  . Multiple sclerosis ([place]) 02/24/2016  . Tobacco abuse  12/11/2015  . Aortic atherosclerosis ([place]) 12/11/2015  . 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 Falls frequently 12/11/2015  . 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 Other emphysema ([place]) 12/11/2015    Past Surgical History:  Procedure Laterality Date  . ANTERIOR CERVICAL DECOMP/DISCECTOMY FUSION N/A 01/19/2019   Procedure: ANTERIOR CERVICAL DECOMPRESSION/DISCECTOMY Cervical three - four;  Surgeon: Ashok Pall, MD;  Location: [place];  Service: Neurosurgery;  Laterality: N/A;  . EYE SURGERY Right   . ORIF FACIAL FRACTURE    . POSTERIOR CERVICAL LAMINECTOMY N/A 01/19/2019   Procedure: POSTERIOR CERVICAL LAMINECTOMY Removal of Synovial Cyst and posterior cervical fusion;  Surgeon: Ashok Pall, MD;  Location: [place];  Service: Neurosurgery;  Laterality: N/A;  . TUBAL LIGATION    . tubaligation       OB History    Gravida  3   Para  3   Term  3   Preterm      AB      Living        SAB      IAB      Ectopic      Multiple      Live Births              Family History  Problem Relation Age of Onset  . Heart attack Mother   . Kidney failure Mother   . Diabetes Mother   . Heart disease Mother   . Hyperlipidemia  Mother   . Hypertension Mother   . Heart attack Father 62  . Hypertension Father   . Hypertension Sister   . Diabetes Sister   . Other Brother        house fire  . Hypertension Brother   . Seizures Brother   . Heart disease Brother        mitral valve  . Arthritis Daughter        DJD, AS fibromyalgia  . Polymyositis Daughter   . Cancer Maternal Grandfather        lung  . Heart disease Maternal Grandfather   . Other Paternal Grandmother        house fire  . Alcohol abuse Son     Social History   Tobacco Use  . Smoking status: Current Every Day Smoker    Packs/day: 0.25    Years: 45.00    Pack years: 11.25  . Smokeless tobacco: Never Used  . Tobacco comment: cutting down  Vaping Use  . Vaping  Use: Never used  Substance Use Topics  . Alcohol use: Yes    Alcohol/week: 2.0 standard drinks    Types: 2 Cans of beer per week  . Drug use: No    Home Medications Prior to Admission medications   Medication Sig Start Date End Date Taking? Authorizing Provider  atorvastatin (LIPITOR) 20 MG tablet Take 1 tablet (20 mg total) by mouth daily. 01/30/20  Yes Elenore Paddy, NP  baclofen (LIORESAL) 10 MG tablet Take 1 tablet (10 mg total) by mouth 3 (three) times daily. 09/24/19  Yes Lovorn, Aundra Millet, MD  celecoxib (CELEBREX) 200 MG capsule TAKE (1) CAPSULE BY MOUTH EVERY 12 HOURS. Patient taking differently: Take 200 mg by mouth 2 (two) times daily. 02/27/20  Yes Gosrani, Nimish C, MD  Cholecalciferol 50 MCG (2000 UT) TABS Take 2,000 Int'l Units by mouth daily.   Yes [provider]  diazepam (VALIUM) 5 MG tablet TAKE (1) TABLET BY MOUTH EVERY SIX HOURS AS NEEDED FOR MUSCLE SPASMS. At least 3x/day- need to take it. For spasms 02/08/20  Yes Lovorn, Aundra Millet, MD  gabapentin (NEURONTIN) 300 MG capsule Take 2 capsules (600 mg total) by mouth 3 (three) times daily. 02/05/19  Yes Angiulli, Mcarthur Rossetti, PA-C  ibuprofen (ADVIL) 800 MG tablet TAKE (1) TABLET BY MOUTH DAILY AS NEEDED. Patient taking differently: Take 800 mg by mouth daily as needed. 05/29/19  Yes Gosrani, Nimish C, MD  propranolol (INDERAL) 40 MG tablet Take 40 mg by mouth 2 (two) times daily. 12/18/19  Yes [provider]  traMADol (ULTRAM) 50 MG tablet Take 2 tablets (100 mg total) by mouth every 6 (six) hours as needed for moderate pain. 12/04/19  Yes Wilson Singer, MD    Allergies    Darvon [propoxyphene], Oxycodone-acetaminophen, and Penicillins  Review of Systems   Review of Systems  Constitutional: Positive for activity change.  Respiratory: Negative for shortness of breath.   Cardiovascular: Negative for chest pain.  Gastrointestinal: Negative for abdominal pain, nausea and vomiting.  Genitourinary: Negative for  difficulty urinating.  Musculoskeletal: Positive for extremity weakness. Negative for neck pain and neck stiffness.  Neurological: Positive for weakness. Negative for light-headedness, numbness and headaches.  All other systems reviewed and are negative.   Physical Exam Updated Vital Signs BP (!) 146/96   Pulse 69   Temp 98.2 F (36.8 C) (Oral)   Resp (!) 24   Ht 5\' 4"  (1.626 m)   Wt 45.8 kg  SpO2 97%   BMI 17.34 kg/m   Physical Exam Vitals and nursing note reviewed.  Constitutional:      Appearance: She is well-developed.  HENT:     Head: Normocephalic and atraumatic.  Eyes:     Extraocular Movements: EOM normal.  Cardiovascular:     Rate and Rhythm: Normal rate.  Pulmonary:     Effort: Pulmonary effort is normal.  Abdominal:     General: Bowel sounds are normal.  Musculoskeletal:     Cervical back: Normal range of motion and neck supple.  Skin:    General: Skin is warm and dry.  Neurological:     Mental Status: She is alert and oriented to person, place, and time.     Comments: Bilateral lower extremity strength is 4- out of 5.  She has 2+ left patellar reflex and 1+ right. Upper extremity strength is 4+ out of 5 bilaterally. Gross sensory exam is normal for upper and lower extremity      ED Results / Procedures / Treatments   Labs (all labs ordered are listed, but only abnormal results are displayed) Labs Reviewed  COMPREHENSIVE METABOLIC PANEL - Abnormal; Notable for the following components:      Result Value   Glucose, Bld 107 (*)    All other components within normal limits  CBC WITH DIFFERENTIAL/PLATELET - Abnormal; Notable for the following components:   WBC 11.0 (*)    Hemoglobin 15.1 (*)    Neutro Abs 7.9 (*)    All other components within normal limits  CK - Abnormal; Notable for the following components:   Total CK 34 (*)    All other components within normal limits  RESP PANEL BY RT-PCR (FLU A&B, COVID) [id number]    EKG EKG  Interpretation  Date/Time:  Friday February 29 2020 21:43:41 EST Ventricular Rate:  72 PR Interval:    QRS Duration: 125 QT Interval:  396 QTC Calculation: 434 R Axis:   71 Text Interpretation: Sinus rhythm IVCD, consider atypical RBBB Nonspecific T abnormalities, lateral leads No acute changes No significant change since last tracing Confirmed by Varney Biles [phone]) on 02/29/2020 10:59:29 PM   Radiology No results found.  Procedures Procedures (including critical care time)  Medications Ordered in ED Medications - No data to display  ED Course  I have reviewed the triage vital signs and the nursing notes.  Pertinent labs & imaging results that were available during my care of the patient were reviewed by me and considered in my medical decision making (see chart for details).    MDM Rules/Calculators/A&P                          64 year old female comes in with chief complaint of weakness.  She has history of MS, cervical myelopathy and cervical cord compression.  Her current symptoms appear to be due and not consistent with her prior pathology.  Initial impression is that she most likely is having MS flareup, with involvement of the lumbar spine possibly.  She has never had lumbar spine disease.  Also possible that she might be having worsening demyelination of the cervical spine or the brain.  We will get basic labs and consult neurology.  10:59 PM Neurology recommends that patient get MRI with and without contrast of the brain and cervical spine.  They would like to be sure that there is no demyelinating process or expansion of the cyst.  [facility] will be transported to  [facility].  Dr. Sedonia Small accepting.  Patient will need MRI with and without contrast. Based on the MRI finding, patient will need further evaluation by neurology or neurosurgery.  If the MRI is completely negative then she will need PT, OT eval.  Final Clinical Impression(s) / ED Diagnoses Final  diagnoses:  Weakness  Myelitis [facility])    Rx / DC Orders ED Discharge Orders    None       Varney Biles, MD 02/29/20 2021    Varney Biles, MD 02/29/20 [id number]    Varney Biles, MD 02/29/20 2259

## 2020-02-29 NOTE — ED Notes (Signed)
[facility] called at this time for transportation at this time for ER to ER transfer. Faith [name]

## 2020-02-29 NOTE — ED Notes (Signed)
Dr. Tonna Corner at bedside via teleneurology cart

## 2020-02-29 NOTE — ED Notes (Signed)
Patient arrives from [facility] ED with c/o worsening b/l lower extremity x1 week. Patient with hx of MS and cervical myelopathy. Neuro was consulted and patient sent here for MRI with/without contrast. Patient alert and oriented. Also c/o chronic lower back pain that is no different than baseline. 20 gauge to R hand flushed and intact.

## 2020-02-29 NOTE — ED Notes (Signed)
teleneurology cart at bedside.

## 2020-02-29 NOTE — Consult Note (Signed)
[facility] TeleNeurology Consult Services  Stat Consult  Date of Service:   02/29/2020 19:39:47  Diagnosis:     .  R53.1 - Weakness  Impression: 64 yo female with history of HTN, HLD, Relapsing-Remitting MS, spinal stenosis with myelopathy s/p laminectomy in 12/2018, PVD, essential tremor, COPD, tobacco abuse, chronic pain presenting with ~1 months of worsening BLE weakness and neck extension weakness that seemed to have acutely worsened over the past 4-5 days. Would obtain MRI Brain and C spine w/wo contrast for further evaluation. If active demyelinating lesions present on MRI, would start pulse steroids (1g Solumedrol x 5 days).  Our recommendations are outlined below.  Diagnostic Studies: MRI brain w/wo contrast MRI C-spine w/wo contrast  DVT Prophylaxis: Choice of Primary Team  Disposition: Neurology will follow  Additional Recommendations: 1g IV Solumedrol x 5 days if active demyelinating disease on MRI   Metrics: [facility] Notification Time: 02/29/2020 19:38:15 Stamp Time: 02/29/2020 19:39:47 Callback Response Time: 02/29/2020 19:40:21   ----------------------------------------------------------------------------------------------------  Chief Complaint: neck pain, weakness, BLE weakness  History of Present Illness: Patient is a 64 year old Female.  Patient presenting from home with ~1 month of symptoms with worsening over the past 4-5 days. She is currently in the process of changing medications for MS disease modifying therapy. Reports that she has been off medications for ~1 year. Notes that she also has a history of spinal stenosis, but did not mention prior surgery. Per chart review, had a laminectomy for treatment in November 2020. It also appears she is following with PM&R physician who she saw ~2 weeks ago and also noted worsening weakness that seemed to be L>R.   Past Medical History:     . Hypertension     . Hyperlipidemia      . Relapsing-Remitting MS, spinal stenosis with myelopathy s/p laminectomy in 12/2018, PVD, essential tremor, COPD, tobacco abuse, chronic pain     Examination: BP(140/93), Pulse(80), Blood Glucose(107) 1A: Level of Consciousness - Alert; keenly responsive + 0 1B: Ask Month and Age - Both Questions Right + 0 1C: Blink Eyes & Squeeze Hands - Performs Both Tasks + 0 2: Test Horizontal Extraocular Movements - Normal + 0 3: Test Visual Fields - No Visual Loss + 0 4: Test Facial Palsy (Use Grimace if Obtunded) - Normal symmetry + 0 5A: Test Left Arm Motor Drift - No Drift for 10 Seconds + 0 5B: Test Right Arm Motor Drift - No Drift for 10 Seconds + 0 6A: Test Left Leg Motor Drift - Some Effort Against Gravity + 2 6B: Test Right Leg Motor Drift - Some Effort Against Gravity + 2 7: Test Limb Ataxia (FNF/Heel-Shin) - No Ataxia + 0 8: Test Sensation - Normal; No sensory loss + 0 9: Test Language/Aphasia - Normal; No aphasia + 0 10: Test Dysarthria - Normal + 0 11: Test Extinction/Inattention - No abnormality + 0  NIHSS Score: 4   Patient / Family was informed the Neurology Consult would occur via TeleHealth consult by way of interactive audio and video telecommunications and consented to receiving care in this manner.  Patient is being evaluated for possible acute neurologic impairment and high probability of imminent or life - threatening deterioration.I spent total of 25 minutes providing care to this patient, including time for face to face visit via telemedicine, review of medical records, imaging studies and discussion of findings with providers, the patient and / or family.   Dr Harrie Jeans   [facility] [phone]  Case  [id number]  

## 2020-03-01 ENCOUNTER — Emergency Department (HOSPITAL_COMMUNITY): Payer: Medicaid Other

## 2020-03-01 ENCOUNTER — Encounter (HOSPITAL_COMMUNITY): Payer: Self-pay | Admitting: Internal Medicine

## 2020-03-01 ENCOUNTER — Other Ambulatory Visit: Payer: Self-pay

## 2020-03-01 ENCOUNTER — Observation Stay (HOSPITAL_COMMUNITY): Payer: Medicaid Other

## 2020-03-01 DIAGNOSIS — J449 Chronic obstructive pulmonary disease, unspecified: Secondary | ICD-10-CM

## 2020-03-01 DIAGNOSIS — Z981 Arthrodesis status: Secondary | ICD-10-CM | POA: Diagnosis not present

## 2020-03-01 DIAGNOSIS — D45 Polycythemia vera: Secondary | ICD-10-CM | POA: Diagnosis not present

## 2020-03-01 DIAGNOSIS — G9389 Other specified disorders of brain: Secondary | ICD-10-CM | POA: Diagnosis not present

## 2020-03-01 DIAGNOSIS — M5126 Other intervertebral disc displacement, lumbar region: Secondary | ICD-10-CM | POA: Diagnosis not present

## 2020-03-01 DIAGNOSIS — I739 Peripheral vascular disease, unspecified: Secondary | ICD-10-CM | POA: Diagnosis present

## 2020-03-01 DIAGNOSIS — R159 Full incontinence of feces: Secondary | ICD-10-CM | POA: Diagnosis present

## 2020-03-01 DIAGNOSIS — M1990 Unspecified osteoarthritis, unspecified site: Secondary | ICD-10-CM | POA: Diagnosis present

## 2020-03-01 DIAGNOSIS — D1809 Hemangioma of other sites: Secondary | ICD-10-CM | POA: Diagnosis not present

## 2020-03-01 DIAGNOSIS — R29898 Other symptoms and signs involving the musculoskeletal system: Secondary | ICD-10-CM | POA: Diagnosis not present

## 2020-03-01 DIAGNOSIS — Z885 Allergy status to narcotic agent status: Secondary | ICD-10-CM | POA: Diagnosis not present

## 2020-03-01 DIAGNOSIS — F17210 Nicotine dependence, cigarettes, uncomplicated: Secondary | ICD-10-CM | POA: Diagnosis present

## 2020-03-01 DIAGNOSIS — G47419 Narcolepsy without cataplexy: Secondary | ICD-10-CM | POA: Diagnosis present

## 2020-03-01 DIAGNOSIS — Z9851 Tubal ligation status: Secondary | ICD-10-CM | POA: Diagnosis not present

## 2020-03-01 DIAGNOSIS — R9431 Abnormal electrocardiogram [ECG] [EKG]: Secondary | ICD-10-CM | POA: Diagnosis not present

## 2020-03-01 DIAGNOSIS — R292 Abnormal reflex: Secondary | ICD-10-CM | POA: Diagnosis present

## 2020-03-01 DIAGNOSIS — M47812 Spondylosis without myelopathy or radiculopathy, cervical region: Secondary | ICD-10-CM | POA: Diagnosis not present

## 2020-03-01 DIAGNOSIS — G9520 Unspecified cord compression: Secondary | ICD-10-CM | POA: Diagnosis present

## 2020-03-01 DIAGNOSIS — R531 Weakness: Secondary | ICD-10-CM | POA: Diagnosis not present

## 2020-03-01 DIAGNOSIS — G0491 Myelitis, unspecified: Secondary | ICD-10-CM

## 2020-03-01 DIAGNOSIS — Z20822 Contact with and (suspected) exposure to covid-19: Secondary | ICD-10-CM | POA: Diagnosis present

## 2020-03-01 DIAGNOSIS — M4802 Spinal stenosis, cervical region: Secondary | ICD-10-CM | POA: Diagnosis not present

## 2020-03-01 DIAGNOSIS — I10 Essential (primary) hypertension: Secondary | ICD-10-CM

## 2020-03-01 DIAGNOSIS — L89312 Pressure ulcer of right buttock, stage 2: Secondary | ICD-10-CM | POA: Diagnosis present

## 2020-03-01 DIAGNOSIS — M50223 Other cervical disc displacement at C6-C7 level: Secondary | ICD-10-CM | POA: Diagnosis not present

## 2020-03-01 DIAGNOSIS — Z79899 Other long term (current) drug therapy: Secondary | ICD-10-CM | POA: Diagnosis not present

## 2020-03-01 DIAGNOSIS — Z83438 Family history of other disorder of lipoprotein metabolism and other lipidemia: Secondary | ICD-10-CM | POA: Diagnosis not present

## 2020-03-01 DIAGNOSIS — G35 Multiple sclerosis: Secondary | ICD-10-CM | POA: Diagnosis not present

## 2020-03-01 DIAGNOSIS — J439 Emphysema, unspecified: Secondary | ICD-10-CM | POA: Diagnosis present

## 2020-03-01 DIAGNOSIS — Z880 Allergy status to penicillin: Secondary | ICD-10-CM | POA: Diagnosis not present

## 2020-03-01 DIAGNOSIS — E785 Hyperlipidemia, unspecified: Secondary | ICD-10-CM | POA: Diagnosis present

## 2020-03-01 HISTORY — DX: Essential (primary) hypertension: I10

## 2020-03-01 LAB — URINALYSIS, ROUTINE W REFLEX MICROSCOPIC
Bilirubin Urine: NEGATIVE
Glucose, UA: NEGATIVE mg/dL
Hgb urine dipstick: NEGATIVE
Ketones, ur: NEGATIVE mg/dL
Leukocytes,Ua: NEGATIVE
Nitrite: NEGATIVE
Protein, ur: NEGATIVE mg/dL
Specific Gravity, Urine: 1.013 (ref 1.005–1.030)
pH: 8 (ref 5.0–8.0)

## 2020-03-01 LAB — CBC
HCT: 43.3 % (ref 36.0–46.0)
Hemoglobin: 15.3 g/dL — ABNORMAL HIGH (ref 12.0–15.0)
MCH: 34 pg (ref 26.0–34.0)
MCHC: 35.3 g/dL (ref 30.0–36.0)
MCV: 96.2 fL (ref 80.0–100.0)
Platelets: 263 10*3/uL (ref 150–400)
RBC: 4.5 MIL/uL (ref 3.87–5.11)
RDW: 13.2 % (ref 11.5–15.5)
WBC: 10.2 10*3/uL (ref 4.0–10.5)
nRBC: 0 % (ref 0.0–0.2)

## 2020-03-01 LAB — HIV ANTIBODY (ROUTINE TESTING W REFLEX): HIV Screen 4th Generation wRfx: NONREACTIVE

## 2020-03-01 LAB — FOLATE: Folate: 15.7 ng/mL (ref >5.9)

## 2020-03-01 LAB — VITAMIN B12: Vitamin B-12: 448 pg/mL (ref 180–914)

## 2020-03-01 MED ORDER — IPRATROPIUM-ALBUTEROL 0.5-2.5 (3) MG/3ML IN SOLN: 3.0000 mL | Freq: Four times a day (QID) | RESPIRATORY_TRACT | Status: DC | PRN

## 2020-03-01 MED ORDER — BACLOFEN 10 MG PO TABS
10.0000 mg | ORAL_TABLET | Freq: Three times a day (TID) | ORAL | Status: DC
  Administered 2020-03-01 – 2020-03-05 (×13): 10 mg via ORAL
  Filled 2020-03-01 (×14): qty 1

## 2020-03-01 MED ORDER — DIAZEPAM 5 MG PO TABS
5.0000 mg | ORAL_TABLET | Freq: Three times a day (TID) | ORAL | Status: DC | PRN
  Administered 2020-03-01 – 2020-03-05 (×6): 5 mg via ORAL
  Filled 2020-03-01 (×6): qty 1

## 2020-03-01 MED ORDER — NICOTINE 7 MG/24HR TD PT24
7.0000 mg | MEDICATED_PATCH | Freq: Every day | TRANSDERMAL | Status: DC
  Filled 2020-03-01: qty 1

## 2020-03-01 MED ORDER — PROPRANOLOL HCL 20 MG PO TABS
40.0000 mg | ORAL_TABLET | Freq: Two times a day (BID) | ORAL | Status: DC
  Administered 2020-03-01 – 2020-03-05 (×9): 40 mg via ORAL
  Filled 2020-03-01 (×4): qty 2
  Filled 2020-03-01: qty 1
  Filled 2020-03-01 (×3): qty 2
  Filled 2020-03-01: qty 1
  Filled 2020-03-01: qty 2

## 2020-03-01 MED ORDER — SODIUM CHLORIDE 0.9 % IV BOLUS
1000.0000 mL | Freq: Once | INTRAVENOUS | Status: AC
  Administered 2020-03-01: 1000 mL via INTRAVENOUS

## 2020-03-01 MED ORDER — GABAPENTIN 300 MG PO CAPS
600.0000 mg | ORAL_CAPSULE | Freq: Three times a day (TID) | ORAL | Status: DC
  Administered 2020-03-01 – 2020-03-05 (×13): 600 mg via ORAL
  Filled 2020-03-01 (×13): qty 2

## 2020-03-01 MED ORDER — SODIUM CHLORIDE 0.9 % IV SOLN
INTRAVENOUS | Status: DC | PRN
  Administered 2020-03-01: 250 mL via INTRAVENOUS

## 2020-03-01 MED ORDER — SODIUM CHLORIDE 0.9 % IV SOLN
1000.0000 mg | INTRAVENOUS | Status: DC
  Administered 2020-03-01 – 2020-03-03 (×2): 1000 mg via INTRAVENOUS
  Filled 2020-03-01 (×4): qty 8

## 2020-03-01 MED ORDER — ATORVASTATIN CALCIUM 10 MG PO TABS
20.0000 mg | ORAL_TABLET | Freq: Every day | ORAL | Status: DC
  Administered 2020-03-01 – 2020-03-05 (×5): 20 mg via ORAL
  Filled 2020-03-01 (×5): qty 2

## 2020-03-01 MED ORDER — CELECOXIB 200 MG PO CAPS
200.0000 mg | ORAL_CAPSULE | Freq: Two times a day (BID) | ORAL | Status: DC
  Administered 2020-03-01 – 2020-03-05 (×9): 200 mg via ORAL
  Filled 2020-03-01 (×10): qty 1

## 2020-03-01 MED ORDER — GADOBUTROL 1 MMOL/ML IV SOLN
4.5000 mL | Freq: Once | INTRAVENOUS | Status: AC | PRN
  Administered 2020-03-01: 4.5 mL via INTRAVENOUS

## 2020-03-01 NOTE — Progress Notes (Signed)
PROGRESS NOTE    Faith [name]  NWG:[id number] DOB: [birth year] DOA: 02/29/2020 PCP: Wilson Singer, MD   Brief Narrative: 65 year old with past medical history significant for relapsing remitting MS, cervical spinal stenosis with myelopathy status post laminectomy in 2020, COPD, hypertension, hyperlipidemia, tobacco use presented to [facility], ED with complaints of bilateral lower extremity weakness.  Telemetry neurology was consulted and recommended obtaining MRI of the brain and C-spine for further evaluation.  Patient was transferred to [facility] for MRI.  Patient reports weakness started to get worse over a week.  She uses an electric wheelchair at baseline but is normally able to use rolling walker when ambulating in the house.  Over the last week she had no being able to use her rolling walker to ambulate in the house.  She has been having difficulty keeping her head up and is looking down a lot.  Assessment & Plan:   Principal Problem:   Bilateral leg weakness Active Problems:   Tobacco use   Hyperlipidemia   COPD (chronic obstructive pulmonary disease) (HCC)   HTN (hypertension)   1-Right Lower Extremity Weakness: Differential: cord compression, vs MS flare, Vs GBS.  MRI brain and C ervical spine no new MS lesions.  Plan for MRI thoracic and Lumbar spine.  If MRI negative, will need LP to evaluate for albuminocytologic dissociation.  Follow; Zinc , Copper ; B 12 448, TSH , folate 15 and thiamine pending, HIV negative.   MS;  On baclofen, gabapentin, valium.   COPD: Continue with duoneb PRN  Tobacco use Started on nicotine patch.   Mild leukocytosis; follow trend.  Resolved. UA negative.   Hypertension -Continue with Propanolol.   Hyperlipidemia -Continue with Lipitor.       Estimated body mass index is 17.34 kg/m as calculated from the following:   Height as of this encounter: 5\' 4"  (1.626 m).   Weight as of this encounter: 45.8 kg.   DVT  prophylaxis: scd, might need LP hold anticoagulation.  Code Status: Full code Family Communication: care discussed with patient Disposition Plan:  Status is: inpatient.   Dispo: The patient is from: Home              Anticipated d/c is to: to be determine              Anticipated d/c date is: 3 days              Patient currently is not medically stable to d/c.        Consultants:   Neurology   Procedures:   noe  Antimicrobials:  None  Subjective: She is alert, report LE weakness for a week now.  Report episodes of bowel incontinence, 3 yesterday   Objective: Vitals:   02/29/20 2345 03/01/20 0000 03/01/20 0015 03/01/20 0545  BP: (!) 145/99 (!) 137/94 (!) 132/93 (!) 137/102  Pulse: 85 81 80 82  Resp: (!) 23 (!) 22 (!) 23 19  Temp:      TempSrc:      SpO2: 97% 96% 95% 97%  Weight:      Height:       No intake or output data in the 24 hours ending 03/01/20 0726 Filed Weights   02/29/20 1713  Weight: 45.8 kg    Examination:  General exam: Appears calm and comfortable  Respiratory system: Clear to auscultation. Respiratory effort normal. Cardiovascular system: S1 & S2 heard, RRR. No JVD, murmurs, rubs, gallops or clicks. No pedal edema.  Gastrointestinal system: Abdomen is nondistended, soft and nontender. No organomegaly or masses felt. Normal bowel sounds heard. Central nervous system: Alert and oriented. B/L LE weakness  Extremities: no edema    Data Reviewed: I have personally reviewed following labs and imaging studies  CBC: Recent Labs  Lab 02/29/20 1851 03/01/20 0552  WBC 11.0* 10.2  NEUTROABS 7.9*  --   HGB 15.1* 15.3*  HCT 44.5 43.3  MCV 97.6 96.2  PLT 277 [id number]   Basic Metabolic Panel: Recent Labs  Lab 02/29/20 1851  NA 137  K 4.0  CL 100  CO2 29  GLUCOSE 107*  BUN 12  CREATININE 0.53  CALCIUM 9.3   GFR: Estimated Creatinine Clearance: 52 mL/min (by C-G formula based on SCr of 0.53 mg/dL). Liver Function Tests: Recent Labs   Lab 02/29/20 1851  AST 17  ALT 16  ALKPHOS 76  BILITOT 0.3  PROT 6.7  ALBUMIN 3.7   No results for input(s): LIPASE, AMYLASE in the last 168 hours. No results for input(s): AMMONIA in the last 168 hours. Coagulation Profile: No results for input(s): INR, PROTIME in the last 168 hours. Cardiac Enzymes: Recent Labs  Lab 02/29/20 1851  CKTOTAL 34*   BNP (last 3 results) No results for input(s): PROBNP in the last 8760 hours. HbA1C: No results for input(s): HGBA1C in the last 72 hours. CBG: No results for input(s): GLUCAP in the last 168 hours. Lipid Profile: No results for input(s): CHOL, HDL, LDLCALC, TRIG, CHOLHDL, LDLDIRECT in the last 72 hours. Thyroid Function Tests: No results for input(s): TSH, T4TOTAL, FREET4, T3FREE, THYROIDAB in the last 72 hours. Anemia Panel: Recent Labs    03/01/20 0552  VITAMINB12 448  FOLATE 15.7   Sepsis Labs: No results for input(s): PROCALCITON, LATICACIDVEN in the last 168 hours.  Recent Results (from the past 240 hour(s))  Resp Panel by RT-PCR (Flu A&B, Covid) Nasopharyngeal Swab     Status: None   Collection Time: 02/29/20  9:05 PM   Specimen: Nasopharyngeal Swab; Nasopharyngeal(NP) swabs in vial transport medium  Result Value Ref Range Status   SARS Coronavirus 2 by RT PCR NEGATIVE NEGATIVE Final    Comment: (NOTE) SARS-CoV-2 target nucleic acids are NOT DETECTED.  The SARS-CoV-2 RNA is generally detectable in upper respiratory specimens during the acute phase of infection. The lowest concentration of SARS-CoV-2 viral copies this assay can detect is 138 copies/mL. A negative result does not preclude SARS-Cov-2 infection and should not be used as the sole basis for treatment or other patient management decisions. A negative result may occur with  improper specimen collection/handling, submission of specimen other than nasopharyngeal swab, presence of viral mutation(s) within the areas targeted by this assay, and inadequate  number of viral copies(<138 copies/mL). A negative result must be combined with clinical observations, patient history, and epidemiological information. The expected result is Negative.  Fact Sheet for Patients:  EntrepreneurPulse.com.au  Fact Sheet for Healthcare Providers:  IncredibleEmployment.be  This test is no t yet approved or cleared by the [place] FDA and  has been authorized for detection and/or diagnosis of SARS-CoV-2 by FDA under an Emergency Use Authorization (EUA). This EUA will remain  in effect (meaning this test can be used) for the duration of the COVID-19 declaration under Section 564(b)(1) of the Act, 21 U.S.C.section 360bbb-3(b)(1), unless the authorization is terminated  or revoked sooner.       Influenza A by PCR NEGATIVE NEGATIVE Final   Influenza B by PCR NEGATIVE NEGATIVE Final  Comment: (NOTE) The Xpert Xpress SARS-CoV-2/FLU/RSV plus assay is intended as an aid in the diagnosis of influenza from Nasopharyngeal swab specimens and should not be used as a sole basis for treatment. Nasal washings and aspirates are unacceptable for Xpert Xpress SARS-CoV-2/FLU/RSV testing.  Fact Sheet for Patients: EntrepreneurPulse.com.au  Fact Sheet for Healthcare Providers: IncredibleEmployment.be  This test is not yet approved or cleared by the [place] FDA and has been authorized for detection and/or diagnosis of SARS-CoV-2 by FDA under an Emergency Use Authorization (EUA). This EUA will remain in effect (meaning this test can be used) for the duration of the COVID-19 declaration under Section 564(b)(1) of the Act, 21 U.S.C. section 360bbb-3(b)(1), unless the authorization is terminated or revoked.  Performed at [facility], [address] [place], [place] [address]          Radiology Studies: MR Brain W and Wo Contrast  Result Date: 03/01/2020 CLINICAL DATA:  Multiple  sclerosis EXAM: MRI HEAD WITHOUT AND WITH CONTRAST MRI CERVICAL SPINE WITHOUT AND WITH CONTRAST TECHNIQUE: Multiplanar, multiecho pulse sequences of the brain and surrounding structures, and cervical spine, to include the craniocervical junction and cervicothoracic junction, were obtained without and with intravenous contrast. CONTRAST:  4.24mL GADAVIST GADOBUTROL 1 MMOL/ML IV SOLN COMPARISON:  01/18/2019 FINDINGS: MRI HEAD FINDINGS Brain: No acute infarct, mass effect or extra-axial collection. No acute or chronic hemorrhage. Numerous bilateral periventricular white matter lesions and pattern unchanged compared to 01/18/2019. The midline structures are normal. There is no abnormal contrast enhancement. Vascular: Major flow voids are preserved. Skull and upper cervical spine: Unchanged left frontal osteoma Sinuses/Orbits:No paranasal sinus fluid levels or advanced mucosal thickening. No mastoid or middle ear effusion. Normal orbits. MRI CERVICAL SPINE FINDINGS Alignment: Reversal of normal cervical lordosis. Vertebrae: C3-4 anterior fusion. Cord: There is a lesion within the right dorsal spinal cord at the C4 level, more clearly visible on the current study. This could be due to demyelination or prior cord compression. No other spinal cord lesions. Posterior Fossa, vertebral arteries, paraspinal tissues: Loss of the normal left vertebral artery flow void. Disc levels: C2-3: Left uncovertebral osteophyte causes moderate left foraminal stenosis unchanged. C3-4: Posterior decompression with anterior fusion. C4-5: Posterior decompression.  No spinal canal stenosis. C5-6: Bilateral uncovertebral hypertrophy with moderate bilateral foraminal stenosis, progressed on the right. C6-7: Left asymmetric disc bulge with bilateral uncovertebral hypertrophy. Unchanged severe left foraminal stenosis. C7-T1: No spinal canal stenosis. IMPRESSION: 1. Unchanged distribution of cerebral white matter lesions in a pattern consistent with  multiple sclerosis. No new or active demyelinating lesions. 2. Focus of hyperintense T2-weighted signal within the right dorsal spinal cord at the C4 level is more clearly visible on the current study. This could be due to demyelination or prior cord compression. 3. No active demyelinating lesions of the cervical spine. 4. C3-4 anterior fusion with widely patent spinal canal. 5. Severe left C7 foraminal stenosis and moderate stenosis at left C3 and bilateral C6. Electronically Signed   By: Ulyses Jarred M.D.   On: 03/01/2020 02:23   MR Cervical Spine W or Wo Contrast  Result Date: 03/01/2020 CLINICAL DATA:  Multiple sclerosis EXAM: MRI HEAD WITHOUT AND WITH CONTRAST MRI CERVICAL SPINE WITHOUT AND WITH CONTRAST TECHNIQUE: Multiplanar, multiecho pulse sequences of the brain and surrounding structures, and cervical spine, to include the craniocervical junction and cervicothoracic junction, were obtained without and with intravenous contrast. CONTRAST:  4.21mL GADAVIST GADOBUTROL 1 MMOL/ML IV SOLN COMPARISON:  01/18/2019 FINDINGS: MRI HEAD FINDINGS Brain: No acute  infarct, mass effect or extra-axial collection. No acute or chronic hemorrhage. Numerous bilateral periventricular white matter lesions and pattern unchanged compared to 01/18/2019. The midline structures are normal. There is no abnormal contrast enhancement. Vascular: Major flow voids are preserved. Skull and upper cervical spine: Unchanged left frontal osteoma Sinuses/Orbits:No paranasal sinus fluid levels or advanced mucosal thickening. No mastoid or middle ear effusion. Normal orbits. MRI CERVICAL SPINE FINDINGS Alignment: Reversal of normal cervical lordosis. Vertebrae: C3-4 anterior fusion. Cord: There is a lesion within the right dorsal spinal cord at the C4 level, more clearly visible on the current study. This could be due to demyelination or prior cord compression. No other spinal cord lesions. Posterior Fossa, vertebral arteries, paraspinal  tissues: Loss of the normal left vertebral artery flow void. Disc levels: C2-3: Left uncovertebral osteophyte causes moderate left foraminal stenosis unchanged. C3-4: Posterior decompression with anterior fusion. C4-5: Posterior decompression.  No spinal canal stenosis. C5-6: Bilateral uncovertebral hypertrophy with moderate bilateral foraminal stenosis, progressed on the right. C6-7: Left asymmetric disc bulge with bilateral uncovertebral hypertrophy. Unchanged severe left foraminal stenosis. C7-T1: No spinal canal stenosis. IMPRESSION: 1. Unchanged distribution of cerebral white matter lesions in a pattern consistent with multiple sclerosis. No new or active demyelinating lesions. 2. Focus of hyperintense T2-weighted signal within the right dorsal spinal cord at the C4 level is more clearly visible on the current study. This could be due to demyelination or prior cord compression. 3. No active demyelinating lesions of the cervical spine. 4. C3-4 anterior fusion with widely patent spinal canal. 5. Severe left C7 foraminal stenosis and moderate stenosis at left C3 and bilateral C6. Electronically Signed   By: Ulyses Jarred M.D.   On: 03/01/2020 02:23        Scheduled Meds: . atorvastatin  20 mg Oral Daily  . baclofen  10 mg Oral TID  . celecoxib  200 mg Oral BID  . gabapentin  600 mg Oral TID  . nicotine  7 mg Transdermal Daily  . propranolol  40 mg Oral BID   Continuous Infusions:   LOS: 0 days    Time spent: 35 minutes.     Elmarie Shiley, MD [facility]   If 7PM-7AM, please contact night-coverage [email or web address]  03/01/2020, 7:26 AM

## 2020-03-01 NOTE — H&P (Signed)
History and Physical    Faith [name] [id number] DOB: 11-12-1955 DOA: 02/29/2020  PCP: Doree Albee, MD Patient coming from: Home  Chief Complaint: Bilateral lower extremity weakness  HPI: Faith [name] is a 65 y.o. female with medical history significant of relapsing remitting MS, cervical spinal stenosis with myelopathy status post laminectomy in 2020, COPD, hypertension, hyperlipidemia, tobacco use presented to [facility] ED with complaints of bilateral lower extremity weakness and weakness with neck extension.  Telemetry neurology was consulted and recommended obtaining MRI of brain and C-spine for further evaluation.  Start high-dose steroids if MRI is indicative of active demyelinating disease. Vital signs stable.  Labs showing WBC 11.0, hemoglobin 15.1, hematocrit 44.5, platelet count 277K.  Sodium 137, potassium 4.0, chloride 100, bicarb 29, BUN 12, creatinine 0.5, glucose 107.  CK 34.  Screening SARS-CoV-2 PCR test and influenza panel both negative.  Patient was transferred to [facility].  MRI brain with and without contrast showing unchanged distribution of cerebral white matter lesions in a pattern consistent with MS; no new or active demyelinating lesions.  MRI C-spine with and without contrast showing showing a focus of hyperintense T2 weighted signal within the right dorsal spinal column at the C4 level which could be due to demyelination or prior cord compression.  No active demyelinating lesions of the cervical spine.  Neurology consulted here at [facility]  Patient states she started having weakness in her legs a week ago and it has been getting progressively worse.  She uses an electric wheelchair at baseline but is normally able to use her rolling walker to ambulate in her house.  However, for the past week she has not been able to use her walker at all.  She has also had difficulty keeping her head up and is looking down a lot.  Denies any numbness or tingling.   Denies any weakness in her upper extremities.  Denies saddle anesthesia or any bowel/bladder incontinence.  She has no other complaints.  Denies fevers, cough, shortness of breath, chest pain, nausea, vomiting, abdominal pain, diarrhea, or dysuria.  Review of Systems:  All systems reviewed and apart from history of presenting illness, are negative.  Past Medical History:  Diagnosis Date  . Anemia    my whole life, Used to take iron  . Arthritis    spine, lumbar  . COPD (chronic obstructive pulmonary disease) ([place])   . Emphysema of lung ([place])   . HTN (hypertension) 03/01/2020  . Hyperlipidemia 11/28/2019  . Narcolepsy    by history, has nightmares  . Neuromuscular disorder ([place])    leg issues from spine    Past Surgical History:  Procedure Laterality Date  . ANTERIOR CERVICAL DECOMP/DISCECTOMY FUSION N/A 01/19/2019   Procedure: ANTERIOR CERVICAL DECOMPRESSION/DISCECTOMY Cervical three - four;  Surgeon: Ashok Pall, MD;  Location: [place];  Service: Neurosurgery;  Laterality: N/A;  . EYE SURGERY Right   . ORIF FACIAL FRACTURE    . POSTERIOR CERVICAL LAMINECTOMY N/A 01/19/2019   Procedure: POSTERIOR CERVICAL LAMINECTOMY Removal of Synovial Cyst and posterior cervical fusion;  Surgeon: Ashok Pall, MD;  Location: [place];  Service: Neurosurgery;  Laterality: N/A;  . TUBAL LIGATION    . tubaligation       reports that she has been smoking. She has a 11.25 pack-year smoking history. She has never used smokeless tobacco. She reports current alcohol use of about 2.0 standard drinks of alcohol per week. She reports that she does not use drugs.  Allergies  Allergen Reactions  . Darvon [Propoxyphene] Other (See Comments)    Hallicuations  . Oxycodone-Acetaminophen     Does not have any affect on her  . Penicillins Rash    Did it involve swelling of the face/tongue/throat, SOB, or low BP? Unknown Did it involve sudden or severe rash/hives, skin peeling, or any reaction on the inside of  your mouth or nose? Unknown Did you need to seek medical attention at a hospital or doctor's office? Unknown When did it last happen?Unknown If all above answers are "NO", may proceed with cephalosporin use.     Family History  Problem Relation Age of Onset  . Heart attack Mother   . Kidney failure Mother   . Diabetes Mother   . Heart disease Mother   . Hyperlipidemia Mother   . Hypertension Mother   . Heart attack Father 29  . Hypertension Father   . Hypertension Sister   . Diabetes Sister   . Other Brother        house fire  . Hypertension Brother   . Seizures Brother   . Heart disease Brother        mitral valve  . Arthritis Daughter        DJD, AS fibromyalgia  . Polymyositis Daughter   . Cancer Maternal Grandfather        lung  . Heart disease Maternal Grandfather   . Other Paternal Grandmother        house fire  . Alcohol abuse Son     Prior to Admission medications   Medication Sig Start Date End Date Taking? Authorizing Provider  atorvastatin (LIPITOR) 20 MG tablet Take 1 tablet (20 mg total) by mouth daily. 01/30/20  Yes Elenore Paddy, NP  baclofen (LIORESAL) 10 MG tablet Take 1 tablet (10 mg total) by mouth 3 (three) times daily. 09/24/19  Yes Lovorn, Aundra Millet, MD  celecoxib (CELEBREX) 200 MG capsule TAKE (1) CAPSULE BY MOUTH EVERY 12 HOURS. Patient taking differently: Take 200 mg by mouth 2 (two) times daily. 02/27/20  Yes Gosrani, Nimish C, MD  Cholecalciferol 50 MCG (2000 UT) TABS Take 2,000 Int'l Units by mouth daily.   Yes [provider]  diazepam (VALIUM) 5 MG tablet TAKE (1) TABLET BY MOUTH EVERY SIX HOURS AS NEEDED FOR MUSCLE SPASMS. At least 3x/day- need to take it. For spasms 02/08/20  Yes Lovorn, Aundra Millet, MD  gabapentin (NEURONTIN) 300 MG capsule Take 2 capsules (600 mg total) by mouth 3 (three) times daily. 02/05/19  Yes Angiulli, Mcarthur Rossetti, PA-C  ibuprofen (ADVIL) 800 MG tablet TAKE (1) TABLET BY MOUTH DAILY AS NEEDED. Patient taking  differently: Take 800 mg by mouth daily as needed. 05/29/19  Yes Gosrani, Nimish C, MD  propranolol (INDERAL) 40 MG tablet Take 40 mg by mouth 2 (two) times daily. 12/18/19  Yes [provider]  traMADol (ULTRAM) 50 MG tablet Take 2 tablets (100 mg total) by mouth every 6 (six) hours as needed for moderate pain. 12/04/19  Yes Wilson Singer, MD    Physical Exam: Vitals:   02/29/20 2324 02/29/20 2345 03/01/20 0000 03/01/20 0015  BP: (!) 146/97 (!) 145/99 (!) 137/94 (!) 132/93  Pulse: 83 85 81 80  Resp: 18 (!) 23 (!) 22 (!) 23  Temp: 98.3 F (36.8 C)     TempSrc: Oral     SpO2: 96% 97% 96% 95%  Weight:      Height:        Physical  Exam Constitutional:      General: She is not in acute distress. HENT:     Head: Normocephalic and atraumatic.  Eyes:     Extraocular Movements: Extraocular movements intact.     Conjunctiva/sclera: Conjunctivae normal.  Cardiovascular:     Rate and Rhythm: Normal rate and regular rhythm.     Pulses: Normal pulses.  Pulmonary:     Effort: Pulmonary effort is normal. No respiratory distress.     Breath sounds: Normal breath sounds. No wheezing or rales.  Abdominal:     General: Bowel sounds are normal. There is no distension.     Palpations: Abdomen is soft.     Tenderness: There is no abdominal tenderness.  Musculoskeletal:        General: No swelling or tenderness.     Cervical back: Normal range of motion and neck supple.  Skin:    General: Skin is warm and dry.  Neurological:     Mental Status: She is alert and oriented to person, place, and time.     Comments: Strength 5 out of 5 in bilateral upper extremities. Bilateral lower extremity weakness: Able to raise up her legs from the bed against gravity only minimally.  Strength 3 out of 5 on plantarflexion bilaterally.     Labs on Admission: I have personally reviewed following labs and imaging studies  CBC: Recent Labs  Lab 02/29/20 1851  WBC 11.0*  NEUTROABS 7.9*  HGB  15.1*  HCT 44.5  MCV 97.6  PLT 99991111   Basic Metabolic Panel: Recent Labs  Lab 02/29/20 1851  NA 137  K 4.0  CL 100  CO2 29  GLUCOSE 107*  BUN 12  CREATININE 0.53  CALCIUM 9.3   GFR: Estimated Creatinine Clearance: 52 mL/min (by C-G formula based on SCr of 0.53 mg/dL). Liver Function Tests: Recent Labs  Lab 02/29/20 1851  AST 17  ALT 16  ALKPHOS 76  BILITOT 0.3  PROT 6.7  ALBUMIN 3.7   No results for input(s): LIPASE, AMYLASE in the last 168 hours. No results for input(s): AMMONIA in the last 168 hours. Coagulation Profile: No results for input(s): INR, PROTIME in the last 168 hours. Cardiac Enzymes: Recent Labs  Lab 02/29/20 1851  CKTOTAL 34*   BNP (last 3 results) No results for input(s): PROBNP in the last 8760 hours. HbA1C: No results for input(s): HGBA1C in the last 72 hours. CBG: No results for input(s): GLUCAP in the last 168 hours. Lipid Profile: No results for input(s): CHOL, HDL, LDLCALC, TRIG, CHOLHDL, LDLDIRECT in the last 72 hours. Thyroid Function Tests: No results for input(s): TSH, T4TOTAL, FREET4, T3FREE, THYROIDAB in the last 72 hours. Anemia Panel: No results for input(s): VITAMINB12, FOLATE, FERRITIN, TIBC, IRON, RETICCTPCT in the last 72 hours. Urine analysis:    Component Value Date/Time   COLORURINE YELLOW 01/18/2019 [place] 01/18/2019 1245   LABSPEC 1.011 01/18/2019 1245   PHURINE 6.0 01/18/2019 [place] 01/18/2019 1245   [place] 01/18/2019 [place] 01/18/2019 [place] 01/18/2019 1245   PROTEINUR NEGATIVE 01/18/2019 1245   NITRITE NEGATIVE 01/18/2019 1245   LEUKOCYTESUR NEGATIVE 01/18/2019 1245    Radiological Exams on Admission: MR Brain W and Wo Contrast  Result Date: 03/01/2020 CLINICAL DATA:  Multiple sclerosis EXAM: MRI HEAD WITHOUT AND WITH CONTRAST MRI CERVICAL SPINE WITHOUT AND WITH CONTRAST TECHNIQUE: Multiplanar, multiecho pulse sequences  of the brain and surrounding structures, and cervical  spine, to include the craniocervical junction and cervicothoracic junction, were obtained without and with intravenous contrast. CONTRAST:  4.16mL GADAVIST GADOBUTROL 1 MMOL/ML IV SOLN COMPARISON:  01/18/2019 FINDINGS: MRI HEAD FINDINGS Brain: No acute infarct, mass effect or extra-axial collection. No acute or chronic hemorrhage. Numerous bilateral periventricular white matter lesions and pattern unchanged compared to 01/18/2019. The midline structures are normal. There is no abnormal contrast enhancement. Vascular: Major flow voids are preserved. Skull and upper cervical spine: Unchanged left frontal osteoma Sinuses/Orbits:No paranasal sinus fluid levels or advanced mucosal thickening. No mastoid or middle ear effusion. Normal orbits. MRI CERVICAL SPINE FINDINGS Alignment: Reversal of normal cervical lordosis. Vertebrae: C3-4 anterior fusion. Cord: There is a lesion within the right dorsal spinal cord at the C4 level, more clearly visible on the current study. This could be due to demyelination or prior cord compression. No other spinal cord lesions. Posterior Fossa, vertebral arteries, paraspinal tissues: Loss of the normal left vertebral artery flow void. Disc levels: C2-3: Left uncovertebral osteophyte causes moderate left foraminal stenosis unchanged. C3-4: Posterior decompression with anterior fusion. C4-5: Posterior decompression.  No spinal canal stenosis. C5-6: Bilateral uncovertebral hypertrophy with moderate bilateral foraminal stenosis, progressed on the right. C6-7: Left asymmetric disc bulge with bilateral uncovertebral hypertrophy. Unchanged severe left foraminal stenosis. C7-T1: No spinal canal stenosis. IMPRESSION: 1. Unchanged distribution of cerebral white matter lesions in a pattern consistent with multiple sclerosis. No new or active demyelinating lesions. 2. Focus of hyperintense T2-weighted signal within the right dorsal spinal cord at the  C4 level is more clearly visible on the current study. This could be due to demyelination or prior cord compression. 3. No active demyelinating lesions of the cervical spine. 4. C3-4 anterior fusion with widely patent spinal canal. 5. Severe left C7 foraminal stenosis and moderate stenosis at left C3 and bilateral C6. Electronically Signed   By: Ulyses Jarred M.D.   On: 03/01/2020 02:23   MR Cervical Spine W or Wo Contrast  Result Date: 03/01/2020 CLINICAL DATA:  Multiple sclerosis EXAM: MRI HEAD WITHOUT AND WITH CONTRAST MRI CERVICAL SPINE WITHOUT AND WITH CONTRAST TECHNIQUE: Multiplanar, multiecho pulse sequences of the brain and surrounding structures, and cervical spine, to include the craniocervical junction and cervicothoracic junction, were obtained without and with intravenous contrast. CONTRAST:  4.33mL GADAVIST GADOBUTROL 1 MMOL/ML IV SOLN COMPARISON:  01/18/2019 FINDINGS: MRI HEAD FINDINGS Brain: No acute infarct, mass effect or extra-axial collection. No acute or chronic hemorrhage. Numerous bilateral periventricular white matter lesions and pattern unchanged compared to 01/18/2019. The midline structures are normal. There is no abnormal contrast enhancement. Vascular: Major flow voids are preserved. Skull and upper cervical spine: Unchanged left frontal osteoma Sinuses/Orbits:No paranasal sinus fluid levels or advanced mucosal thickening. No mastoid or middle ear effusion. Normal orbits. MRI CERVICAL SPINE FINDINGS Alignment: Reversal of normal cervical lordosis. Vertebrae: C3-4 anterior fusion. Cord: There is a lesion within the right dorsal spinal cord at the C4 level, more clearly visible on the current study. This could be due to demyelination or prior cord compression. No other spinal cord lesions. Posterior Fossa, vertebral arteries, paraspinal tissues: Loss of the normal left vertebral artery flow void. Disc levels: C2-3: Left uncovertebral osteophyte causes moderate left foraminal stenosis  unchanged. C3-4: Posterior decompression with anterior fusion. C4-5: Posterior decompression.  No spinal canal stenosis. C5-6: Bilateral uncovertebral hypertrophy with moderate bilateral foraminal stenosis, progressed on the right. C6-7: Left asymmetric disc bulge with bilateral uncovertebral hypertrophy. Unchanged severe left foraminal stenosis. C7-T1: No spinal canal stenosis.  IMPRESSION: 1. Unchanged distribution of cerebral white matter lesions in a pattern consistent with multiple sclerosis. No new or active demyelinating lesions. 2. Focus of hyperintense T2-weighted signal within the right dorsal spinal cord at the C4 level is more clearly visible on the current study. This could be due to demyelination or prior cord compression. 3. No active demyelinating lesions of the cervical spine. 4. C3-4 anterior fusion with widely patent spinal canal. 5. Severe left C7 foraminal stenosis and moderate stenosis at left C3 and bilateral C6. Electronically Signed   By: Ulyses Jarred M.D.   On: 03/01/2020 02:23    EKG: Independently reviewed.  Sinus rhythm, artifact.  No prior tracing for comparison.  Assessment/Plan Principal Problem:   Bilateral leg weakness Active Problems:   Tobacco use   Hyperlipidemia   COPD (chronic obstructive pulmonary disease) (HCC)   HTN (hypertension)   Bilateral lower extremity weakness: MRI brain with and without contrast showing unchanged distribution of cerebral white matter lesions in a pattern consistent with MS; no new or active demyelinating lesions.  MRI C-spine with and without contrast showing showing a focus of hyperintense T2 weighted signal within the right dorsal spinal column at the C4 level which could be due to demyelination or prior cord compression.  No active demyelinating lesions of the cervical spine.  -Neurology feels that the patient's bilateral lower extremity weakness could be due to cord compression, MS flare, pseudoflareup, or potential GBS.  Ordered  additional work-up with MRI of thoracic and lumbar spine with and without contrast.  If negative, will need LP to evaluate for albuminocytologic dissociation.  Additional labs ordered including copper, zinc, B12, TSH, folate, and thiamine.  Appreciate neurology recommendations.  COPD: Stable.  No signs of acute exacerbation.  No inhalers listed in home medications. -DuoNeb as needed.  Tobacco use: Smokes 0.25 packs of cigarettes daily. -NicoDerm patch and counseling  Mild leukocytosis: WBC count 11.0.  Possibly reactive.  Patient is afebrile.  No infectious signs or symptoms. -Repeat CBC to check WBC count.  UA pending.  Hypertension: Stable. -Continue propranolol  Hyperlipidemia -Continue Lipitor  DVT prophylaxis: SCDs at this time Code Status: Full code Family Communication: No family available this time. Disposition Plan: Status is: Observation  The patient remains OBS appropriate and will d/c before 2 midnights.  Dispo: The patient is from: Home              Anticipated d/c is to: Home              Anticipated d/c date is: 2 days              Patient currently is not medically stable to d/c.  The medical decision making on this patient was of high complexity and the patient is at high risk for clinical deterioration, therefore this is a level 3 visit.  Shela Leff MD [facility]  If 7PM-7AM, please contact night-coverage [email or web address]  03/01/2020, 4:45 AM

## 2020-03-01 NOTE — ED Notes (Signed)
In report, notified floor RN that pt reports a missing bag containing clothing and medications and that her belongings were searched for and not found. While giving report, it came to mind that maybe her belongings were left at [facility] when pt was transported here. Mardella Layman, RN said she would call [facility] to find out.

## 2020-03-01 NOTE — Consult Note (Signed)
NEUROLOGY CONSULTATION NOTE   Date of service: March 01, 2020 Patient Name: Faith [name] MRN:  [id number] DOB:  1955/10/09 Reason for consult: "hx of BL lower extremities" _ _ _   _ __   _ __ _ _  __ __   _ __   __ _  History of Present Illness  Faith [name] is a 65 y.o. 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  Patient reports that after her surgery, she was able to walk with a wheeled walker about 185 feet.  She has noted that over the last week, she is essentially wheelchair-bound.  She does not think there is any associated numbness with this.  Denies any recent signs or symptoms of an infection with no fever, no chills, no symptoms of an upper respiratory infection, no symptoms of urinary tract infection, no symptoms of gastroenteritis, endorses that she smokes cigarettes and feels like last week she had some cough but that is not unusual for her.  She denies any saddle anesthesia, denies Lhermitte sign, no urinary or bowel incontinence or retention.  She was seen by teleneurology and MRI brain and C-spine with and without contrast were obtained and were negative for any active demyelinating lesion or cord compression.  Demonstrate a focus of T2 hyperintense lesion at C4 likely a result of prior cord compression.  ROS   Constitutional Denies weight loss, fever and chills.  HEENT Denies changes in vision and hearing.  Respiratory Denies SOB and cough.  CV Denies palpitations and CP  GI Denies abdominal pain, nausea, vomiting and diarrhea.  GU Denies dysuria and urinary frequency.  MSK Denies myalgia and joint pain.  Skin Denies rash and pruritus.  Neurological Denies headache and syncope.  Psychiatric Denies recent  changes in mood. Denies anxiety and depression.   Past History   Past Medical History:  Diagnosis Date  . Anemia    my whole life, Used to take iron  . Arthritis    spine, lumbar  . COPD (chronic obstructive pulmonary disease) ([place])   . Emphysema of lung ([place])   . Hyperlipidemia 11/28/2019  . Narcolepsy    by history, has nightmares  . Neuromuscular disorder ([place])    leg issues from spine   Past Surgical History:  Procedure Laterality Date  . ANTERIOR CERVICAL DECOMP/DISCECTOMY FUSION N/A 01/19/2019   Procedure: ANTERIOR CERVICAL DECOMPRESSION/DISCECTOMY Cervical three - four;  Surgeon: Ashok Pall, MD;  Location: [facility];  Service: Neurosurgery;  Laterality: N/A;  . EYE SURGERY Right   . ORIF FACIAL FRACTURE    . POSTERIOR CERVICAL LAMINECTOMY N/A 01/19/2019   Procedure: POSTERIOR CERVICAL LAMINECTOMY Removal of Synovial Cyst and posterior cervical fusion;  Surgeon: Ashok Pall, MD;  Location: [facility];  Service: Neurosurgery;  Laterality: N/A;  . TUBAL LIGATION    . tubaligation     Family History  Problem Relation Age of Onset  . Heart attack Mother   . Kidney failure Mother   . Diabetes Mother   . Heart disease Mother   . Hyperlipidemia Mother   . Hypertension Mother   . Heart attack Father 25  . Hypertension Father   . Hypertension Sister   . Diabetes Sister   . Other Brother  house fire  . Hypertension Brother   . Seizures Brother   . Heart disease Brother        mitral valve  . Arthritis Daughter        DJD, AS fibromyalgia  . Polymyositis Daughter   . Cancer Maternal Grandfather        lung  . Heart disease Maternal Grandfather   . Other Paternal Grandmother        house fire  . Alcohol abuse Son    Social History   Socioeconomic History  . Marital status: Legally Separated    Spouse name: Not on file  . Number of children: 3  . Years of education: 78  . Highest education level: Not on file  Occupational History  . Occupation: disabled     Comment: house painting  Tobacco Use  . Smoking status: Current Every Day Smoker    Packs/day: 0.25    Years: 45.00    Pack years: 11.25  . Smokeless tobacco: Never Used  . Tobacco comment: cutting down  Vaping Use  . Vaping Use: Never used  Substance and Sexual Activity  . Alcohol use: Yes    Alcohol/week: 2.0 standard drinks    Types: 2 Cans of beer per week  . Drug use: No  . Sexual activity: Not Currently    Birth control/protection: Post-menopausal  Other Topics Concern  . Not on file  Social History Narrative   Patient lives with her son. [name], age 39, frequently visits   Social Determinants of Health   Financial Resource Strain: Not on file  Food Insecurity: Not on file  Transportation Needs: Not on file  Physical Activity: Not on file  Stress: Not on file  Social Connections: Not on file   Allergies  Allergen Reactions  . Darvon [Propoxyphene] Other (See Comments)    Hallicuations  . Oxycodone-Acetaminophen     Does not have any affect on her  . Penicillins Rash    Did it involve swelling of the face/tongue/throat, SOB, or low BP? Unknown Did it involve sudden or severe rash/hives, skin peeling, or any reaction on the inside of your mouth or nose? Unknown Did you need to seek medical attention at a hospital or doctor's office? Unknown When did it last happen?Unknown If all above answers are "NO", may proceed with cephalosporin use.     Medications  (Not in a hospital admission)    Vitals   Vitals:   02/29/20 2324 02/29/20 2345 03/01/20 0000 03/01/20 0015  BP: (!) 146/97 (!) 145/99 (!) 137/94 (!) 132/93  Pulse: 83 85 81 80  Resp: 18 (!) 23 (!) 22 (!) 23  Temp: 98.3 F (36.8 C)     TempSrc: Oral     SpO2: 96% 97% 96% 95%  Weight:      Height:         Body mass index is 17.34 kg/m.  Physical Exam   General: Laying comfortably in bed; in no acute distress. HENT: Normal oropharynx and mucosa. Normal external appearance of ears  and nose. Neck: Supple, no pain or tenderness CV: No JVD. No peripheral edema. Pulmonary: Symmetric Chest rise. Normal respiratory effort. Abdomen: Soft to touch, non-tender. Ext: No cyanosis, edema, or deformity Skin: No rash. Normal palpation of skin.  Musculoskeletal: Normal digits and nails by inspection. No clubbing.  Neurologic Examination  Mental status/Cognition: Alert, oriented to self, place, month and year, good attention. Speech/language: Fluent, comprehension intact, object naming intact, repetition intact. Cranial nerves:  CN II Pupils equal and reactive to light, no VF deficits   CN III,IV,VI EOM intact, no gaze preference or deviation, no nystagmus   CN V normal sensation in V1, V2, and V3 segments bilaterally   CN VII no asymmetry, no nasolabial fold flattening   CN VIII normal hearing to speech    CN IX & X normal palatal elevation, no uvular deviation    CN XI 5/5 head turn and 5/5 shoulder shrug bilaterally    CN XII midline tongue protrusion    Motor:  Muscle bulk: poor, tone increased, tremor yes, coarse tremor in BL upper extremities. Mvmt Root Nerve  Muscle Right Left Comments  SA C5/6 Ax Deltoid 5 5   EF C5/6 Mc Biceps 5 5   EE C6/7/8 Rad Triceps 5 5   WF C6/7 Med FCR 5 5   WE C7/8 PIN ECU 5 5   F Ab C8/T1 U ADM/FDI 5 5   HF L1/2/3 Fem Illopsoas 4 4   KE L2/3/4 Fem Quad 4 4   DF L4/5 D Peron Tib Ant 4 4   PF S1/2 Tibial Grc/Sol 4 4    Reflexes:  Right Left Comments  Pectoralis      Biceps (C5/6) 2+ 2+   Brachioradialis (C5/6) 2+ 2+    Triceps (C6/7) 2+ 2+    Patellar (L3/4) 3 3 Cross adductors + BL   Achilles (S1) 4 4 Clonus BL   Hoffman + +    Plantar up up   Jaw jerk    Sensation:  Light touch Intact throughout   Pin prick Decreased in BL arms and legs.   Temperature    Vibration   Proprioception    Coordination/Complex Motor:  - Finger to Nose with ataxia and tremor BL - Heel to shin unable to do - Rapid alternating movement are  slowed. - Gait: deferred, wheelchair x 1 week.  Labs   CBC:  Recent Labs  Lab 02/29/20 1851  WBC 11.0*  NEUTROABS 7.9*  HGB 15.1*  HCT 44.5  MCV 97.6  PLT 99991111    Basic Metabolic Panel:  Lab Results  Component Value Date   NA 137 02/29/2020   K 4.0 02/29/2020   CO2 29 02/29/2020   GLUCOSE 107 (H) 02/29/2020   BUN 12 02/29/2020   CREATININE 0.53 02/29/2020   CALCIUM 9.3 02/29/2020   GFRNONAA >60 02/29/2020   GFRAA 117 11/28/2019   Lipid Panel:  Lab Results  Component Value Date   LDLCALC 97 11/28/2019   HgbA1c: No results found for: HGBA1C Urine Drug Screen: No results found for: LABOPIA, COCAINSCRNUR, LABBENZ, AMPHETMU, THCU, LABBARB  Alcohol Level     Component Value Date/Time   [facility]  01/28/2009 0158    <5        LOWEST DETECTABLE LIMIT FOR SERUM ALCOHOL IS 5 mg/dL FOR MEDICAL PURPOSES ONLY    MRI Brain and C spine with and without contrast: 1. Unchanged distribution of cerebral white matter lesions in a pattern consistent with multiple sclerosis. No new or active demyelinating lesions. 2. Focus of hyperintense T2-weighted signal within the right dorsal spinal cord at the C4 level is more clearly visible on the current study. This could be due to demyelination or prior cord compression. 3. No active demyelinating lesions of the cervical spine. 4. C3-4 anterior fusion with widely patent spinal canal. 5. Severe left C7 foraminal stenosis and moderate stenosis at left C3 and bilateral C6.   Impression   Faith  A [name] is a 65 y.o. female with acute on chronic worsening of bilateral lower extremity weakness.  Neuro exam notable for diffuse hyperreflexia and bilateral lower extremity weakness that is very symmetric.  Given her history of prior cervical cord compression and history of MS, differential is broad including cord compression, MS flareup, pseudoflareup, potential GBS.  We will start work-up with MRI of her T and L-spine given her symptoms could  potentially localize to thoracic or lumbar spinal cord.  If negative, will need to pursue LP to evaluate for albumin no cytologic dissociation.  Recommendations  - MRI T and L spine with and without contrast. - If negative, will benefit from an LP to evaluate for albuminocytologic dissociation. - Copper, Zinc, Vit b12, TSH, Folate, Thiamine. ______________________________________________________________________   Thank you for the opportunity to take part in the care of this patient. If you have any further questions, please contact the neurology consultation attending.  Signed,  Erick Blinks [facility] Pager Number [phone] _ _ _   _ __   _ __ _ _  __ __   _ __   __ _

## 2020-03-01 NOTE — ED Notes (Signed)
Pt reports she had a big blue bag with her that had clothing and her medications in them. Searched her old room, searched her new room, called MRI, no bag found.

## 2020-03-01 NOTE — ED Notes (Signed)
Pt transported to MRI 

## 2020-03-01 NOTE — ED Notes (Addendum)
Pt removed from bedpan. Pt didn't have BM

## 2020-03-01 NOTE — ED Notes (Signed)
Have patient call Corliss Parish at [phone]

## 2020-03-01 NOTE — ED Notes (Signed)
Call light provided to pt

## 2020-03-01 NOTE — ED Notes (Signed)
Placed pt on bed pan.

## 2020-03-01 NOTE — Progress Notes (Signed)
Pt received contrast at 1am. Will need to be delayed before contrast can be given again. TBD later today. Ordering Dr. Eather Colas.

## 2020-03-01 NOTE — Progress Notes (Addendum)
MRI of thoracic and lumbar spine reveals no acute-appearing lesion, but was performed without additional contrast bolus.   A/R: - Overall clinical presentation most consistent with MS exacerbation. Although by definition PPMS is without clear remissions and exacerbations, pulsed dose steroids can improve function per the literature and some cases can have features that suggest that exacerbations can occur.   - The patient consents to receive pulsed dose steroid treatment.  - IV Solumedrol 1000 mg qd x 5 days has been ordered. Will need q4h CBG and daily CBC with BMP while on steroids.   Electronically signed: Dr. Caryl Pina

## 2020-03-01 NOTE — ED Notes (Signed)
Transport put in to transport pt

## 2020-03-01 NOTE — ED Notes (Signed)
Updated pt's daughter ?

## 2020-03-02 DIAGNOSIS — R29898 Other symptoms and signs involving the musculoskeletal system: Secondary | ICD-10-CM | POA: Diagnosis not present

## 2020-03-02 LAB — CBC
HCT: 46.3 % — ABNORMAL HIGH (ref 36.0–46.0)
Hemoglobin: 15.7 g/dL — ABNORMAL HIGH (ref 12.0–15.0)
MCH: 32.7 pg (ref 26.0–34.0)
MCHC: 33.9 g/dL (ref 30.0–36.0)
MCV: 96.5 fL (ref 80.0–100.0)
Platelets: 297 10*3/uL (ref 150–400)
RBC: 4.8 MIL/uL (ref 3.87–5.11)
RDW: 13 % (ref 11.5–15.5)
WBC: 10.7 10*3/uL — ABNORMAL HIGH (ref 4.0–10.5)
nRBC: 0 % (ref 0.0–0.2)

## 2020-03-02 LAB — BASIC METABOLIC PANEL
Anion gap: 14 (ref 5–15)
BUN: 13 mg/dL (ref 8–23)
CO2: 21 mmol/L — ABNORMAL LOW (ref 22–32)
Calcium: 9.3 mg/dL (ref 8.9–10.3)
Chloride: 104 mmol/L (ref 98–111)
Creatinine, Ser: 0.73 mg/dL (ref 0.44–1.00)
GFR, Estimated: >60 mL/min (ref >60)
Glucose, Bld: 211 mg/dL — ABNORMAL HIGH (ref 70–99)
Potassium: 4.1 mmol/L (ref 3.5–5.1)
Sodium: 139 mmol/L (ref 135–145)

## 2020-03-02 LAB — GLUCOSE, CAPILLARY
Glucose-Capillary: 154 mg/dL — ABNORMAL HIGH (ref 70–99)
Glucose-Capillary: 152 mg/dL — ABNORMAL HIGH (ref 70–99)
Glucose-Capillary: 140 mg/dL — ABNORMAL HIGH (ref 70–99)
Glucose-Capillary: 147 mg/dL — ABNORMAL HIGH (ref 70–99)
Glucose-Capillary: 174 mg/dL — ABNORMAL HIGH (ref 70–99)

## 2020-03-02 MED ORDER — ENSURE ENLIVE PO LIQD
237.0000 mL | Freq: Two times a day (BID) | ORAL | Status: DC
  Administered 2020-03-02 – 2020-03-03 (×3): 237 mL via ORAL

## 2020-03-02 MED ORDER — TRAMADOL HCL 50 MG PO TABS: 50.0000 mg | ORAL_TABLET | Freq: Three times a day (TID) | ORAL | Status: DC | PRN

## 2020-03-02 MED ORDER — TRAMADOL HCL 50 MG PO TABS
50.0000 mg | ORAL_TABLET | Freq: Four times a day (QID) | ORAL | Status: DC | PRN
  Administered 2020-03-02 – 2020-03-05 (×3): 100 mg via ORAL
  Filled 2020-03-02 (×3): qty 2

## 2020-03-02 MED ORDER — VITAMIN D 25 MCG (1000 UNIT) PO TABS: 1000.0000 [IU] | ORAL_TABLET | Freq: Every day | ORAL | Status: DC

## 2020-03-02 MED ORDER — VITAMIN D 25 MCG (1000 UNIT) PO TABS
2000.0000 [IU] | ORAL_TABLET | Freq: Every day | ORAL | Status: DC
  Administered 2020-03-02 – 2020-03-05 (×4): 2000 [IU] via ORAL
  Filled 2020-03-02 (×4): qty 2

## 2020-03-02 NOTE — Consult Note (Addendum)
WOC Nurse Consult Note: Reason for Consult: Right buttock pressure injury.  See Dr. Dahlia Client note (PM&R Provider) from 02/08/2020 for Patient Education and POC pertaining to this wound. Bedside RNs Enrigue Catena, Wound Treatment Associate and Jesse Fall assisted me with this consultation.  Wound type:Pressure/Shear Pressure Injury POA: Yes Measurement:5cm x 2cm x 0.2cm Wound bed: 90% pink, 10% nonviable (eschar) at 7-9 o'clock Drainage (amount, consistency, odor) Small amount serous to light yellow Periwound:Intact, mild erythema, no induration or fluctuance Dressing procedure/placement/frequency: A mattress replacement has been requested and turning and repositioning is in place. Topical care will be with twice daily cleansing followed by covering the wound bed with an antimicrobial nonadherent (xeroform) topped with dry gauze and covered with a silicone foam dressing. The silicone foam dressing may be reused for 2-3 days and changed PRN for soiling or rolling of dressing edges. A RD consult is requested for consideration of Vit C and Zinc supplementation.  Discussed with Dr. Sunnie Nielsen on 03/01/2020 the request for a photograph of this wound and while she was unable to accommodate this request yesterday, she indicated that she would photograph today and upload image to the EMR.  Upon discharge, it is recommended that patient be referred to an outpatient wound care center of her choosing for follow up and continuing care of this wound with regard to her positioning, off loading, etc. If you agree, please order/refer.  WOC nursing team will not follow, but will remain available to this patient, the nursing and medical teams.  Please re-consult if needed. Thanks, Ladona Mow, MSN, RN, GNP, [facility]  Pager# [phone]

## 2020-03-02 NOTE — Plan of Care (Signed)

## 2020-03-02 NOTE — Progress Notes (Addendum)
PROGRESS NOTE    RONIA FERRY  [id number] DOB: 1955-06-15 DOA: 02/29/2020 PCP: Doree Albee, MD   Brief Narrative: 65 year old with past medical history significant for relapsing remitting MS, cervical spinal stenosis with myelopathy status post laminectomy in 2020, COPD, hypertension, hyperlipidemia, tobacco use presented to [facility], ED with complaints of bilateral lower extremity weakness.  Telemetry neurology was consulted and recommended obtaining MRI of the brain and C-spine for further evaluation.  Patient was transferred to [facility] for MRI.  Patient reports weakness started to get worse over a week.  She uses an electric wheelchair at baseline but is normally able to use rolling walker when ambulating in the house.  Over the last week she had no being able to use her rolling walker to ambulate in the house.  She has been having difficulty keeping her head up and is looking down a lot.  Assessment & Plan:   Principal Problem:   Bilateral leg weakness Active Problems:   Tobacco use   Hyperlipidemia   COPD (chronic obstructive pulmonary disease) (HCC)   HTN (hypertension)   1-Right Lower Extremity Weakness: MS flare.  Differential: cord compression, vs MS flare, Vs GBS.  MRI brain and C ervical spine no new MS lesions.  MRI thoracic and Lumbar spine. No cord compression, extensive chronic demyelinating diseases.  If MRI negative, will need LP to evaluate for albuminocytologic dissociation.  Follow; Zinc , Copper ; B 12 448, TSH , folate 15 and thiamine pending, HIV negative.  Evaluated By Dr Cheral Marker, who think patient has MS flare. MRI thoracic and lumbar spine was without contrast. Patient started on IV high dose solumedrol.    MS;  On baclofen, gabapentin, valium.   COPD: Continue with duoneb PRN  Tobacco use Started on nicotine patch.   Mild leukocytosis; follow trend.  Resolved. UA negative.   Hypertension -Continue with Propanolol.    Hyperlipidemia -Continue with Lipitor.  Right buttock pressure injury, 5 cm x 2 x 0.2 cm.  Present prior to admission.       Estimated body mass index is 17.34 kg/m as calculated from the following:   Height as of this encounter: 5\' 4"  (1.626 m).   Weight as of this encounter: 45.8 kg.   DVT prophylaxis: scd, might need LP hold anticoagulation.  Code Status: Full code Family Communication: care discussed with patient Disposition Plan:  Status is: inpatient.   Dispo: The patient is from: Home              Anticipated d/c is to: to be determine              Anticipated d/c date is: 3 days              Patient currently is not medically stable to d/c.        Consultants:   Neurology   Procedures:   noe  Antimicrobials:  None  Subjective: She is alert, report LE weakness for a week now.  Report episodes of bowel incontinence, 3 yesterday   Objective: Vitals:   03/01/20 2005 03/02/20 0139 03/02/20 0434 03/02/20 1319  BP: 122/79 (!) 126/102 (!) 125/94 126/87  Pulse: 80 80 79 77  Resp: 17 17 18 18   Temp: 98.1 F (36.7 C) 99 F (37.2 C) 98.1 F (36.7 C) 98.4 F (36.9 C)  TempSrc: Oral Oral Oral Oral  SpO2: 99% 94% 94% 94%  Weight:      Height:  Intake/Output Summary (Last 24 hours) at 03/02/2020 1510 Last data filed at 03/02/2020 0600 Gross per 24 hour  Intake 123.04 ml  Output 40 ml  Net 83.04 ml   Filed Weights   02/29/20 1713  Weight: 45.8 kg    Examination:  General exam: NAD Respiratory system: CTA Cardiovascular system: S 1, S 2 RRR. Gastrointestinal system: BS present, soft, nt Central nervous system: alert, B/L LE weak Extremities: no edema    Data Reviewed: I have personally reviewed following labs and imaging studies  CBC: Recent Labs  Lab 02/29/20 1851 03/01/20 0552 03/02/20 0917  WBC 11.0* 10.2 10.7*  NEUTROABS 7.9*  --   --   HGB 15.1* 15.3* 15.7*  HCT 44.5 43.3 46.3*  MCV 97.6 96.2 96.5  PLT 277 263 297    Basic Metabolic Panel: Recent Labs  Lab 02/29/20 1851 03/02/20 0917  NA 137 139  K 4.0 4.1  CL 100 104  CO2 29 21*  GLUCOSE 107* 211*  BUN 12 13  CREATININE 0.53 0.73  CALCIUM 9.3 9.3   GFR: Estimated Creatinine Clearance: 52 mL/min (by C-G formula based on SCr of 0.73 mg/dL). Liver Function Tests: Recent Labs  Lab 02/29/20 1851  AST 17  ALT 16  ALKPHOS 76  BILITOT 0.3  PROT 6.7  ALBUMIN 3.7   No results for input(s): LIPASE, AMYLASE in the last 168 hours. No results for input(s): AMMONIA in the last 168 hours. Coagulation Profile: No results for input(s): INR, PROTIME in the last 168 hours. Cardiac Enzymes: Recent Labs  Lab 02/29/20 1851  CKTOTAL 34*   BNP (last 3 results) No results for input(s): PROBNP in the last 8760 hours. HbA1C: No results for input(s): HGBA1C in the last 72 hours. CBG: Recent Labs  Lab 03/02/20 0436 03/02/20 0804 03/02/20 1302  GLUCAP 154* 152* 140*   Lipid Profile: No results for input(s): CHOL, HDL, LDLCALC, TRIG, CHOLHDL, LDLDIRECT in the last 72 hours. Thyroid Function Tests: No results for input(s): TSH, T4TOTAL, FREET4, T3FREE, THYROIDAB in the last 72 hours. Anemia Panel: Recent Labs    03/01/20 0552  VITAMINB12 448  FOLATE 15.7   Sepsis Labs: No results for input(s): PROCALCITON, LATICACIDVEN in the last 168 hours.  Recent Results (from the past 240 hour(s))  Resp Panel by RT-PCR (Flu A&B, Covid) Nasopharyngeal Swab     Status: None   Collection Time: 02/29/20  9:05 PM   Specimen: Nasopharyngeal Swab; Nasopharyngeal(NP) swabs in vial transport medium  Result Value Ref Range Status   SARS Coronavirus 2 by RT PCR NEGATIVE NEGATIVE Final    Comment: (NOTE) SARS-CoV-2 target nucleic acids are NOT DETECTED.  The SARS-CoV-2 RNA is generally detectable in upper respiratory specimens during the acute phase of infection. The lowest concentration of SARS-CoV-2 viral copies this assay can detect is 138 copies/mL. A  negative result does not preclude SARS-Cov-2 infection and should not be used as the sole basis for treatment or other patient management decisions. A negative result may occur with  improper specimen collection/handling, submission of specimen other than nasopharyngeal swab, presence of viral mutation(s) within the areas targeted by this assay, and inadequate number of viral copies(<138 copies/mL). A negative result must be combined with clinical observations, patient history, and epidemiological information. The expected result is Negative.  Fact Sheet for Patients:  BloggerCourse.com  Fact Sheet for Healthcare Providers:  SeriousBroker.it  This test is no t yet approved or cleared by the [place] and  has been authorized for  detection and/or diagnosis of SARS-CoV-2 by FDA under an Emergency Use Authorization (EUA). This EUA will remain  in effect (meaning this test can be used) for the duration of the COVID-19 declaration under Section 564(b)(1) of the Act, 21 U.S.C.section 360bbb-3(b)(1), unless the authorization is terminated  or revoked sooner.       Influenza A by PCR NEGATIVE NEGATIVE Final   Influenza B by PCR NEGATIVE NEGATIVE Final    Comment: (NOTE) The Xpert Xpress SARS-CoV-2/FLU/RSV plus assay is intended as an aid in the diagnosis of influenza from Nasopharyngeal swab specimens and should not be used as a sole basis for treatment. Nasal washings and aspirates are unacceptable for Xpert Xpress SARS-CoV-2/FLU/RSV testing.  Fact Sheet for Patients: EntrepreneurPulse.com.au  Fact Sheet for Healthcare Providers: IncredibleEmployment.be  This test is not yet approved or cleared by the [place] FDA and has been authorized for detection and/or diagnosis of SARS-CoV-2 by FDA under an Emergency Use Authorization (EUA). This EUA will remain in effect (meaning this test can  be used) for the duration of the COVID-19 declaration under Section 564(b)(1) of the Act, 21 U.S.C. section 360bbb-3(b)(1), unless the authorization is terminated or revoked.  Performed at [facility], [address], [place], [place] [address]          Radiology Studies: MR Brain W and Wo Contrast  Result Date: 03/01/2020 CLINICAL DATA:  Multiple sclerosis EXAM: MRI HEAD WITHOUT AND WITH CONTRAST MRI CERVICAL SPINE WITHOUT AND WITH CONTRAST TECHNIQUE: Multiplanar, multiecho pulse sequences of the brain and surrounding structures, and cervical spine, to include the craniocervical junction and cervicothoracic junction, were obtained without and with intravenous contrast. CONTRAST:  4.72mL GADAVIST GADOBUTROL 1 MMOL/ML IV SOLN COMPARISON:  01/18/2019 FINDINGS: MRI HEAD FINDINGS Brain: No acute infarct, mass effect or extra-axial collection. No acute or chronic hemorrhage. Numerous bilateral periventricular white matter lesions and pattern unchanged compared to 01/18/2019. The midline structures are normal. There is no abnormal contrast enhancement. Vascular: Major flow voids are preserved. Skull and upper cervical spine: Unchanged left frontal osteoma Sinuses/Orbits:No paranasal sinus fluid levels or advanced mucosal thickening. No mastoid or middle ear effusion. Normal orbits. MRI CERVICAL SPINE FINDINGS Alignment: Reversal of normal cervical lordosis. Vertebrae: C3-4 anterior fusion. Cord: There is a lesion within the right dorsal spinal cord at the C4 level, more clearly visible on the current study. This could be due to demyelination or prior cord compression. No other spinal cord lesions. Posterior Fossa, vertebral arteries, paraspinal tissues: Loss of the normal left vertebral artery flow void. Disc levels: C2-3: Left uncovertebral osteophyte causes moderate left foraminal stenosis unchanged. C3-4: Posterior decompression with anterior fusion. C4-5: Posterior decompression.  No spinal canal  stenosis. C5-6: Bilateral uncovertebral hypertrophy with moderate bilateral foraminal stenosis, progressed on the right. C6-7: Left asymmetric disc bulge with bilateral uncovertebral hypertrophy. Unchanged severe left foraminal stenosis. C7-T1: No spinal canal stenosis. IMPRESSION: 1. Unchanged distribution of cerebral white matter lesions in a pattern consistent with multiple sclerosis. No new or active demyelinating lesions. 2. Focus of hyperintense T2-weighted signal within the right dorsal spinal cord at the C4 level is more clearly visible on the current study. This could be due to demyelination or prior cord compression. 3. No active demyelinating lesions of the cervical spine. 4. C3-4 anterior fusion with widely patent spinal canal. 5. Severe left C7 foraminal stenosis and moderate stenosis at left C3 and bilateral C6. Electronically Signed   By: Ulyses Jarred M.D.   On: 03/01/2020 02:23   MR THORACIC  SPINE WO CONTRAST  Result Date: 03/01/2020 CLINICAL DATA:  65 year old female with a history of COPD, multiple sclerosis and cervical myelopathy. Lower extremity weakness beginning a week ago with progression. Status post brain and cervical spine MRI without and with contrast at 0130 hours today. EXAM: MRI THORACIC SPINE WITHOUT CONTRAST TECHNIQUE: Multiplanar, multisequence MR imaging of the thoracic spine was performed. No intravenous contrast was administered. COMPARISON:  Cervical spine MRI today reported separately. FINDINGS: Limited cervical spine imaging: Stable from the cervical MRI earlier today. Thoracic spine segmentation:  Appears normal. Alignment: Normal to mildly exaggerated thoracic kyphosis. No spondylolisthesis. Vertebrae: T4 benign vertebral body hemangioma redemonstrated. No marrow edema or evidence of acute osseous abnormality. Cord: No evidence of abnormal intradural enhancement on these 9-10 hour delayed postcontrast images. But there is widespread abnormal increased T2 and STIR  hyperintensity within the thoracic cord, including multiple right hemi cord lesions from the cervicothoracic junction through T4 (series 42, image 12), patchy more holo cord lesions at T7, T10 (series 41, image 8), and patchy ventral lesion about 1 level above the conus at T11 (same image). There is also a left dorsal hemicord lesion at T10-T11 (series 43, image 31). Levels of associated spinal cord volume loss (series 43, image 13). No cord expansion. Heterogeneous increased signal also suspected in the conus at T12. Paraspinal and other soft tissues: Negative. Disc levels: Capacious thoracic spinal canal and mild for age spine degeneration. No significant disc herniation. No thoracic spinal stenosis. Degenerative thoracic neural foraminal stenosis limited to the T1 and T2 nerve levels. IMPRESSION: 1. Extensive chronic appearing demyelinating disease in the thoracic spinal cord with cord volume loss. No cord expansion or delayed postcontrast enhancement (s/p gadolinium contrast at 0130 hours today) to suggest active demyelination. 2. Mild for age superimposed thoracic spine degeneration. No thoracic spinal stenosis. Electronically Signed   By: Genevie Ann M.D.   On: 03/01/2020 11:33   MR LUMBAR SPINE WO CONTRAST  Result Date: 03/01/2020 CLINICAL DATA:  65 year old female with a history of COPD, multiple sclerosis and cervical myelopathy. Lower extremity weakness beginning a week ago with progression. Received IV gadolinium contrast about 0130 hours today 4 earlier brain and cervical spine study. EXAM: MRI LUMBAR SPINE WITHOUT CONTRAST TECHNIQUE: Multiplanar, multisequence MR imaging of the lumbar spine was performed. No intravenous contrast was administered. COMPARISON:  Cervical and thoracic spine MRI today reported separately. FINDINGS: Segmentation:  Normal, concordant with the thoracic numbering today. Alignment: Mild straightening of lumbar lordosis and dextroconvex lower lumbar scoliosis. No spondylolisthesis.  Vertebrae: Patchy degenerative appearing anterior endplate marrow edema at L2-L3. Normal background bone marrow signal. Mix of mild acute and chronic marrow signal changes at the L4-L5 endplates. Background bone marrow signal within normal limits. Intact visible sacrum and SI joints. Conus medullaris and cauda equina: Conus extends to the T12 level. Conus reported separately. Cauda equina nerve roots appear symmetric and normal. No delayed abnormal intradural enhancement. Paraspinal and other soft tissues: Negative. There is no paraspinal soft tissue inflammation. Disc levels: T12-L1:  Negative. L1-L2:  Negative aside from facet hypertrophy. L2-L3: Disc space loss. Circumferential disc bulge and endplate spurring eccentric to the right. Mild to moderate facet hypertrophy greater on the right. Trace facet joint fluid. No spinal stenosis. Moderate right lateral recess stenosis (right L3 nerve level). Mild right L2 foraminal stenosis. L3-L4: Disc desiccation, disc space loss and circumferential disc bulge. Mild to moderate facet hypertrophy. Mild spinal and right lateral recess stenosis (right L4 nerve level). Mild right L3 foraminal  stenosis. L4-L5: Disc space loss. Heterogeneous signal within the disc but no convincing endplate or paraspinal inflammation. Endplate spurring. Mild facet hypertrophy. No significant spinal stenosis. Mild to moderate bilateral lateral recess stenosis (L5 nerve levels). Moderate to severe left and mild right L4 foraminal stenosis. L5-S1: Minor disc bulging. Moderate facet hypertrophy. Mild left L5 foraminal stenosis. IMPRESSION: 1. Cauda equina nerve roots appear normal. No thickening or delayed postcontrast enhancement. 2. Lumbar spine degeneration degeneration at L2-L3 through L4-L5. Mild degenerative appearing endplate marrow edema at the former. - mild L3-L4 spinal stenosis. - moderate right lateral recess stenosis at the right L3 and bilateral L5 nerve levels. - moderate to severe  left L4 neural foraminal stenosis. Electronically Signed   By: Genevie Ann M.D.   On: 03/01/2020 11:57   MR Cervical Spine W or Wo Contrast  Result Date: 03/01/2020 CLINICAL DATA:  Multiple sclerosis EXAM: MRI HEAD WITHOUT AND WITH CONTRAST MRI CERVICAL SPINE WITHOUT AND WITH CONTRAST TECHNIQUE: Multiplanar, multiecho pulse sequences of the brain and surrounding structures, and cervical spine, to include the craniocervical junction and cervicothoracic junction, were obtained without and with intravenous contrast. CONTRAST:  4.26mL GADAVIST GADOBUTROL 1 MMOL/ML IV SOLN COMPARISON:  01/18/2019 FINDINGS: MRI HEAD FINDINGS Brain: No acute infarct, mass effect or extra-axial collection. No acute or chronic hemorrhage. Numerous bilateral periventricular white matter lesions and pattern unchanged compared to 01/18/2019. The midline structures are normal. There is no abnormal contrast enhancement. Vascular: Major flow voids are preserved. Skull and upper cervical spine: Unchanged left frontal osteoma Sinuses/Orbits:No paranasal sinus fluid levels or advanced mucosal thickening. No mastoid or middle ear effusion. Normal orbits. MRI CERVICAL SPINE FINDINGS Alignment: Reversal of normal cervical lordosis. Vertebrae: C3-4 anterior fusion. Cord: There is a lesion within the right dorsal spinal cord at the C4 level, more clearly visible on the current study. This could be due to demyelination or prior cord compression. No other spinal cord lesions. Posterior Fossa, vertebral arteries, paraspinal tissues: Loss of the normal left vertebral artery flow void. Disc levels: C2-3: Left uncovertebral osteophyte causes moderate left foraminal stenosis unchanged. C3-4: Posterior decompression with anterior fusion. C4-5: Posterior decompression.  No spinal canal stenosis. C5-6: Bilateral uncovertebral hypertrophy with moderate bilateral foraminal stenosis, progressed on the right. C6-7: Left asymmetric disc bulge with bilateral uncovertebral  hypertrophy. Unchanged severe left foraminal stenosis. C7-T1: No spinal canal stenosis. IMPRESSION: 1. Unchanged distribution of cerebral white matter lesions in a pattern consistent with multiple sclerosis. No new or active demyelinating lesions. 2. Focus of hyperintense T2-weighted signal within the right dorsal spinal cord at the C4 level is more clearly visible on the current study. This could be due to demyelination or prior cord compression. 3. No active demyelinating lesions of the cervical spine. 4. C3-4 anterior fusion with widely patent spinal canal. 5. Severe left C7 foraminal stenosis and moderate stenosis at left C3 and bilateral C6. Electronically Signed   By: Ulyses Jarred M.D.   On: 03/01/2020 02:23        Scheduled Meds: . atorvastatin  20 mg Oral Daily  . baclofen  10 mg Oral TID  . celecoxib  200 mg Oral BID  . cholecalciferol  2,000 Units Oral Daily  . feeding supplement  237 mL Oral BID BM  . gabapentin  600 mg Oral TID  . nicotine  7 mg Transdermal Daily  . propranolol  40 mg Oral BID   Continuous Infusions: . sodium chloride 250 mL (03/01/20 2229)  . methylPREDNISolone (SOLU-MEDROL) injection 1,000 mg (  03/01/20 2230)     LOS: 1 day    Time spent: 35 minutes.     Elmarie Shiley, MD [facility]   If 7PM-7AM, please contact night-coverage [email or web address]  03/02/2020, 3:10 PM

## 2020-03-03 DIAGNOSIS — R29898 Other symptoms and signs involving the musculoskeletal system: Secondary | ICD-10-CM | POA: Diagnosis not present

## 2020-03-03 LAB — GLUCOSE, CAPILLARY
Glucose-Capillary: 85 mg/dL (ref 70–99)
Glucose-Capillary: 87 mg/dL (ref 70–99)
Glucose-Capillary: 92 mg/dL (ref 70–99)
Glucose-Capillary: 111 mg/dL — ABNORMAL HIGH (ref 70–99)

## 2020-03-03 MED ORDER — POLYETHYLENE GLYCOL 3350 17 G PO PACK
17.0000 g | PACK | Freq: Every day | ORAL | Status: DC | PRN
  Administered 2020-03-03: 17 g via ORAL
  Filled 2020-03-03: qty 1

## 2020-03-03 MED ORDER — ENSURE ENLIVE PO LIQD: 237.0000 mL | Freq: Every day | ORAL | Status: DC

## 2020-03-03 MED ORDER — SENNA 8.6 MG PO TABS
1.0000 | ORAL_TABLET | Freq: Every day | ORAL | Status: DC
  Administered 2020-03-03 – 2020-03-05 (×3): 8.6 mg via ORAL
  Filled 2020-03-03 (×3): qty 1

## 2020-03-03 MED ORDER — ADULT MULTIVITAMIN W/MINERALS CH
1.0000 | ORAL_TABLET | Freq: Every day | ORAL | Status: DC
  Administered 2020-03-03 – 2020-03-05 (×3): 1 via ORAL
  Filled 2020-03-03 (×3): qty 1

## 2020-03-03 MED ORDER — JUVEN PO PACK
1.0000 | PACK | Freq: Two times a day (BID) | ORAL | Status: DC
  Administered 2020-03-03 – 2020-03-05 (×4): 1 via ORAL
  Filled 2020-03-03 (×4): qty 1

## 2020-03-03 NOTE — Evaluation (Addendum)
Physical Therapy Evaluation Patient Details Name: Faith [name] MRN: [id number] DOB: Nov 07, 1955 Today's Date: 03/03/2020   History of Present Illness  Faith [name] is a 65 y.o. female with medical history significant of relapsing remitting MS, cervical spinal stenosis with myelopathy status post laminectomy in 2020, COPD, hypertension, hyperlipidemia, tobacco use presented to [facility] ED with complaints of bilateral lower extremity weakness and weakness with neck extension.  Clinical Impression  Patient received in bed, she is agreeable to PT assessment. She requires mod/max assist for supine to sit. Pain in bottom with scooting. She requires mod assist for sit to stand from bed and bsc. B UE assist for balance in standing. Patient was able to ambulate 15 feet with RW and min assist. Slow pace with poor balance. Patient fatigued after this distance. Patient will continue to benefit from skilled PT while here to improve functional independence, strength and safety.      Follow Up Recommendations Home health PT;Supervision for mobility/OOB    Equipment Recommendations  None recommended by PT    Recommendations for Other Services       Precautions / Restrictions Precautions Precaution Comments: mod fall Restrictions Weight Bearing Restrictions: No      Mobility  Bed Mobility Overal bed mobility: Needs Assistance Bed Mobility: Supine to Sit;Sit to Supine     Supine to sit: Mod assist Sit to supine: Min assist   General bed mobility comments: patient required mod/max assist for supine to sit, min assist for sit to supine.    Transfers Overall transfer level: Needs assistance Equipment used: Rolling walker (2 wheeled) Transfers: Sit to/from Stand Sit to Stand: Mod assist;From elevated surface         General transfer comment: requires assist to boost up into standing  Ambulation/Gait Ambulation/Gait assistance: Min assist Gait Distance (Feet): 15 Feet Assistive device:  Rolling walker (2 wheeled) Gait Pattern/deviations: Step-to pattern;Decreased stride length;Decreased step length - right;Decreased step length - left;Shuffle;Narrow base of support Gait velocity: decreased   General Gait Details: patient requires RW and min assist for balance and safety with mobility  Stairs            Wheelchair Mobility    Modified Rankin (Stroke Patients Only)       Balance Overall balance assessment: Needs assistance Sitting-balance support: Feet supported Sitting balance-Leahy Scale: Fair     Standing balance support: Bilateral upper extremity supported;During functional activity Standing balance-Leahy Scale: Poor Standing balance comment: reliant on RW and external assist to prevent falling                             Pertinent Vitals/Pain Pain Assessment: Faces Faces Pain Scale: Hurts a little bit Pain Location: bottom Pain Descriptors / Indicators: Grimacing;Guarding Pain Intervention(s): Monitored during session;Repositioned    Home Living Family/patient expects to be discharged to:: Private residence Living Arrangements: Children Available Help at Discharge: Family;Personal care attendant Type of Home: House Home Access: Ramped entrance     Home Layout: Two level;Able to live on main level with bedroom/bathroom Home Equipment: Walker - 4 wheels;Wheelchair - power      Prior Function Level of Independence: Needs assistance   Gait / Transfers Assistance Needed: Patient reports she ambulates inside home with rollator, uses wheelchair when out of home.  ADL's / Homemaking Assistance Needed: Son assists and PCA 3x per week for 3 hours a day        Hand Dominance  Dominant Hand: Right    Extremity/Trunk Assessment   Upper Extremity Assessment Upper Extremity Assessment: Generalized weakness    Lower Extremity Assessment Lower Extremity Assessment: Generalized weakness    Cervical / Trunk Assessment Cervical /  Trunk Assessment: Normal  Communication   Communication: No difficulties  Cognition Arousal/Alertness: Awake/alert Behavior During Therapy: WFL for tasks assessed/performed Overall Cognitive Status: Within Functional Limits for tasks assessed                                        General Comments      Exercises     Assessment/Plan    PT Assessment Patient needs continued PT services  PT Problem List Decreased strength;Decreased mobility;Decreased activity tolerance;Decreased balance       PT Treatment Interventions DME instruction;Therapeutic activities;Gait training;Therapeutic exercise;Patient/family education;Balance training;Functional mobility training    PT Goals (Current goals can be found in the Care Plan section)  Acute Rehab PT Goals Patient Stated Goal: to return home, move better PT Goal Formulation: With patient Time For Goal Achievement: 03/17/20 Potential to Achieve Goals: Fair    Frequency Min 3X/week   Barriers to discharge        Co-evaluation               AM-PAC PT "6 Clicks" Mobility  Outcome Measure Help needed turning from your back to your side while in a flat bed without using bedrails?: A Lot Help needed moving from lying on your back to sitting on the side of a flat bed without using bedrails?: A Lot Help needed moving to and from a bed to a chair (including a wheelchair)?: A Lot Help needed standing up from a chair using your arms (e.g., wheelchair or bedside chair)?: A Lot Help needed to walk in hospital room?: A Lot Help needed climbing 3-5 steps with a railing? : A Lot 6 Click Score: 12    End of Session Equipment Utilized During Treatment: Gait belt Activity Tolerance: Patient tolerated treatment well Patient left: in bed;with call bell/phone within reach;with SCD's reapplied Nurse Communication: Mobility status;Other (comment) (needs purewick replaced) PT Visit Diagnosis: Unsteadiness on feet (R26.81);Muscle  weakness (generalized) (M62.81);Other abnormalities of gait and mobility (R26.89);Difficulty in walking, not elsewhere classified (R26.2);History of falling (Z91.81)    Time: [id number] PT Time Calculation (min) (ACUTE ONLY): 25 min   Charges:   PT Evaluation $PT Eval Moderate Complexity: 1 Mod PT Treatments $Gait Training: 8-22 mins        [employer], PT, GCS 03/03/20,12:33 PM

## 2020-03-03 NOTE — Plan of Care (Signed)
Patient has BUE tremors, she is able to lift BLE off the bed, which patient stated she couldn't do before coming to the hospital. In no acute distress. Problem: Education: Goal: Knowledge of General Education information will improve Description: Including pain rating scale, medication(s)/side effects and non-pharmacologic comfort measures Outcome: Progressing   Problem: Health Behavior/Discharge Planning: Goal: Ability to manage health-related needs will improve Outcome: Progressing   Problem: Clinical Measurements: Goal: Ability to maintain clinical measurements within normal limits will improve Outcome: Progressing Goal: Will remain free from infection Outcome: Progressing Goal: Diagnostic test results will improve Outcome: Progressing Goal: Respiratory complications will improve Outcome: Progressing Goal: Cardiovascular complication will be avoided Outcome: Progressing   Problem: Activity: Goal: Risk for activity intolerance will decrease Outcome: Progressing   Problem: Nutrition: Goal: Adequate nutrition will be maintained Outcome: Progressing   Problem: Coping: Goal: Level of anxiety will decrease Outcome: Progressing   Problem: Elimination: Goal: Will not experience complications related to bowel motility Outcome: Progressing Goal: Will not experience complications related to urinary retention Outcome: Progressing   Problem: Pain Managment: Goal: General experience of comfort will improve Outcome: Progressing   Problem: Safety: Goal: Ability to remain free from injury will improve Outcome: Progressing   Problem: Skin Integrity: Goal: Risk for impaired skin integrity will decrease Outcome: Progressing

## 2020-03-03 NOTE — Progress Notes (Signed)
Initial Nutrition Assessment  DOCUMENTATION CODES:   Underweight  INTERVENTION:   -Decrease Ensure Enlive po to daily, each supplement provides 350 kcal and 20 grams of protein -1 packet Juven BID, each packet provides 95 calories, 2.5 grams of protein (collagen), and 9.8 grams of carbohydrate (3 grams sugar); also contains 7 grams of L-arginine and L-glutamine, 300 mg vitamin C, 15 mg vitamin E, 1.2 mcg vitamin B-12, 9.5 mg zinc, 200 mg calcium, and 1.5 g  Calcium Beta-hydroxy-Beta-methylbutyrate to support wound healing -MVI with minerals daily -Magic cup TID with meals, each supplement provides 290 kcal and 9 grams of protein  NUTRITION DIAGNOSIS:   Increased nutrient needs related to wound healing as evidenced by estimated needs.  GOAL:   Patient will meet greater than or equal to 90% of their needs  MONITOR:   PO intake,Supplement acceptance,Diet advancement,Labs,Weight trends,Skin,I & O's  REASON FOR ASSESSMENT:   Malnutrition Screening Tool,Consult Assessment of nutrition requirement/status  ASSESSMENT:   Faith [name] is a 65 y.o. female with medical history significant of relapsing remitting MS, cervical spinal stenosis with myelopathy status post laminectomy in 2020, COPD, hypertension, hyperlipidemia, tobacco use presented to [facility] ED with complaints of bilateral lower extremity weakness and weakness with neck extension.  Pt admitted with bilateral lower extremity weakness.   1/1- MRI of thoracic and lumbar region reveals no acute-appearing lesion  Reviewed I/O's: +627 ml x 24 hours and +1.7 L since admission  UOP: 200 ml x 24 hours  Pt unavailable at time of visit. Unable to obtain further nutrition-related history or complete nutrition-focused physical exam at this time.   Per neurology notes, symptoms most consistent with MS exacerbation.   Per H&P, pt has experienced a general decline in functional mobility. At baseline, she is able to ambulate with  assistance of a rolling walker, however, now is dependent on an electric wheelchair. Pt also with difficulty keeping her head up.   Pt with good appetite. Noted meal completion 80%. Pt is consuming Ensure Enlive supplements per MAR.   Reviewed wt hx; wt has been stable over the past year.   RD consulted by [facility] for consideration of zinc and vitamin C supplementation. Juven contains arginine, glutamine, CaHMB, collagen protein, and vitamins C, E, B-12, and zinc to help promote wound healing (will provide 19 mg zinc daily and 600 mg vitamin C daily).   Pt at high risk for malnutrition given MS, decrease mobility, increased nutrient needs for pressure injury healing, and underweight status. RD suspects malnutrition, however, unable to identify at this time.   Medications reviewed and include vitamin D3 and senna.   Labs reviewed: CBGS: 85-174 (inpatient orders for glycemic control are none). Copper, zinc, B12, TSH, folate, and thiamine pending.   Diet Order:   Diet Order            Diet regular Room service appropriate? Yes; Fluid consistency: Thin  Diet effective now                 EDUCATION NEEDS:   No education needs have been identified at this time  Skin:  Skin Assessment: Skin Integrity Issues: Skin Integrity Issues:: Other (Comment) Other: pressure injury to sacrum  Last BM:  03/01/20  Height:   Ht Readings from Last 1 Encounters:  02/29/20 5\' 4"  (1.626 m)    Weight:   Wt Readings from Last 1 Encounters:  02/29/20 45.8 kg    Ideal Body Weight:  54.5 kg  BMI:  Body mass  index is 17.34 kg/m.  Estimated Nutritional Needs:   Kcal:  1650-1850  Protein:  90-105 grams  Fluid:  > 1.6 L    Levada Schilling, RD, LDN, CDCES Registered Dietitian II Certified Diabetes Care and Education Specialist Please refer to [facility] for RD and/or RD on-call/weekend/after hours pager

## 2020-03-03 NOTE — TOC Initial Note (Addendum)
Transition of Care [facility]) - Initial/Assessment Note    Patient Details  Name: Faith [name] MRN: [id number] Date of Birth: 20-Nov-1955  Transition of Care [facility] CM/SW Contact:    Kingsley Plan, RN Phone Number: 03/03/2020, 1:59 PM  Clinical Narrative:                 PAtient from home with son. Patient has electric wheel chair, standard wheel chair , rolling walker and Rolator, shower chair , hospital bed ( usually sleeps in recliner) .   Discussed PT recommendation for HHPT. Patient in agreement. NCM provided medicare.gov list of home health agencies. Patient would like same agency she had last year but unsure of name. NCM looked up [facility] had [facility]. If [facility] cannot accept patient, patient has no preference.   NCM made referral to [place] Pack with [facility], she will see if office will accept. Await determination. [facility] unable to accept due to wound care.    Left Amy with [facility] a message, await call back. Amy returned call. They are at their capacity for Medicaid and cannot accept medicaid at this time.    Left Marylene Land with [facility] a message , awaiting call back. [facility] unable to accept   [facility] has no service in [place] area at the moment ( spoke to [place] ).  Eber Jones with [facility] does not service the area.  Elnita Maxwell with [facility] is not in network with patient's insurance.  Pearson Grippe with [facility] will review clinicals and make a determination.Pearson Grippe unable to accept at this time.   Spoke to [facility]) they have no staffing in patient's area.    Spoke to [place] with [facility] she does not have staff to accept referral.   Spoke to [facility] with [facility] , she will ask office if they can accept , await call back   . [facility] unable to accept referral.   NCM has exhausted home health agency list. Will let MD and patient know   Expected Discharge Plan: Home w Home Health  Services     Patient Goals and CMS Choice Patient states their goals for this hospitalization and ongoing recovery are:: to return to home CMS Medicare.gov Compare Post Acute Care list provided to:: Patient Choice offered to / list presented to : Patient  Expected Discharge Plan and Services Expected Discharge Plan: Home w Home Health Services   Discharge Planning Services: CM Consult Post Acute Care Choice: Home Health Living arrangements for the past 2 months: Single Family Home                 DME Arranged: N/A DME Agency: NA       HH Arranged: PT HH Agency: [facility] Date HH Agency Contacted: 03/03/20 Time HH Agency Contacted: [id number] Representative spoke with at [facility] Agency: [place] will send to office for approval  Prior Living Arrangements/Services Living arrangements for the past 2 months: Single Family Home Lives with:: Adult Children (son) Patient language and need for interpreter reviewed:: Yes        Need for Family Participation in Patient Care: Yes (Comment) Care giver support system in place?: Yes (comment) Current home services: DME Criminal Activity/Legal Involvement Pertinent to Current Situation/Hospitalization: No - Comment as needed  Activities of Daily Living Home Assistive Devices/Equipment: Wheelchair,Walker (specify type) ADL Screening (condition at time of admission) Patient's cognitive ability adequate to safely complete  daily activities?: Yes Is the patient deaf or have difficulty hearing?: No Does the patient have difficulty seeing, even when wearing glasses/contacts?: No Does the patient have difficulty concentrating, remembering, or making decisions?: No Patient able to express need for assistance with ADLs?: Yes Does the patient have difficulty dressing or bathing?: Yes Independently performs ADLs?: No Communication: Independent Dressing (OT): Dependent Is this a change from baseline?: Pre-admission  baseline Grooming: Dependent Is this a change from baseline?: Pre-admission baseline Feeding: Needs assistance Is this a change from baseline?: Change from baseline, expected to last <3 days Bathing: Dependent Is this a change from baseline?: Pre-admission baseline Toileting: Dependent Is this a change from baseline?: Change from baseline, expected to last >3days In/Out Bed: Dependent Is this a change from baseline?: Change from baseline, expected to last >3 days Walks in Home: Dependent Is this a change from baseline?: Change from baseline, expected to last >3 days Does the patient have difficulty walking or climbing stairs?: Yes Weakness of Legs: Both Weakness of Arms/Hands: Both  Permission Sought/Granted   Permission granted to share information with : No              Emotional Assessment Appearance:: Appears stated age Attitude/Demeanor/Rapport: Engaged Affect (typically observed): Accepting Orientation: : Oriented to Self,Oriented to Place,Oriented to  Time,Oriented to Situation Alcohol / Substance Use: Not Applicable Psych Involvement: No (comment)  Admission diagnosis:  Myelitis ([place]) [G04.91] Weakness [R53.1] Bilateral leg weakness [R29.898] Patient Active Problem List   Diagnosis Date Noted  . Bilateral leg weakness 03/01/2020  . COPD (chronic obstructive pulmonary disease) ([place]) 03/01/2020  . HTN (hypertension) 03/01/2020  . Pressure injury of right buttock, stage 2 ([place]) 02/08/2020  . PVD (peripheral vascular disease) ([place]) 11/28/2019  . Hyperlipidemia 11/28/2019  . Wheelchair dependence 09/24/2019  . Cervical myelopathy ([place]) 01/22/2019  . Spinal cord compression due to degenerative disorder of spinal column 01/20/2019  . Synovial cyst   . Spondylolisthesis, cervical region 01/19/2019  . Chronic back pain 01/18/2019  . Multiple sclerosis exacerbation ([place]) 01/17/2019  . Erythrocytosis 09/13/2018  . Vitamin D deficiency 03/09/2016  . Multiple sclerosis  ([place]) 02/24/2016  . Tobacco use 12/11/2015  . Aortic atherosclerosis ([place]) 12/11/2015  . 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 Falls frequently 12/11/2015  . 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 Other emphysema ([place]) 12/11/2015   PCP:  Doree Albee, MD Pharmacy:   [place], [place] [address] Phone: [phone] Fax: [phone]     Social Determinants of Health (SDOH) Interventions    Readmission Risk Interventions No flowsheet data found.

## 2020-03-03 NOTE — Progress Notes (Signed)
PROGRESS NOTE    BRINA UMEDA  HBZ:[id number] DOB: [birth year] DOA: 02/29/2020 PCP: Wilson Singer, MD   Brief Narrative: 65 year old with past medical history significant for relapsing remitting MS, cervical spinal stenosis with myelopathy status post laminectomy in 2020, COPD, hypertension, hyperlipidemia, tobacco use presented to [facility], ED with complaints of bilateral lower extremity weakness.  Telemetry neurology was consulted and recommended obtaining MRI of the brain and C-spine for further evaluation.  Patient was transferred to [facility] for MRI.  Patient reports weakness started to get worse over a week.  She uses an electric wheelchair at baseline but is normally able to use rolling walker when ambulating in the house.  Over the last week she had no being able to use her rolling walker to ambulate in the house.  She has been having difficulty keeping her head up and is looking down a lot.  Assessment & Plan:   Principal Problem:   Bilateral leg weakness Active Problems:   Tobacco use   Hyperlipidemia   COPD (chronic obstructive pulmonary disease) (HCC)   HTN (hypertension)   1-Right Lower Extremity Weakness: MS flare.  -Differential: cord compression, vs MS flare, Vs GBS.  -MRI brain and C ervical spine no new MS lesions.  -MRI thoracic and Lumbar spine. No cord compression, extensive chronic demyelinating diseases.  -Follow; Zinc , Copper ; B 12 448, TSH , folate 15 and thiamine pending, HIV negative.  -Evaluated By Dr Otelia Limes, who think patient has MS flare. MRI thoracic and lumbar spine was without contrast. -Patient started on IV high dose solumedrol. Day 3/5 -She was able to moves LE more today   MS;  On baclofen, gabapentin, valium.   COPD: Continue with duoneb PRN  Tobacco use Started on nicotine patch.   Mild leukocytosis; follow trend.  Resolved. UA negative.   Hypertension -Continue with Propanolol.   Hyperlipidemia -Continue with  Lipitor.  Right buttock pressure injury, 5 cm x 2 x 0.2 cm.  Present prior to admission.    Continue with wound care    Estimated body mass index is 17.34 kg/m as calculated from the following:   Height as of this encounter: 5\' 4"  (1.626 m).   Weight as of this encounter: 45.8 kg.   DVT prophylaxis: scd, might need LP hold anticoagulation.  Code Status: Full code Family Communication: care discussed with patient, daughter over phone 1/02 Disposition Plan:  Status is: inpatient.   Dispo: The patient is from: Home              Anticipated d/c is to: to be determine              Anticipated d/c date is: 3 days              Patient currently is not medically stable to d/c.        Consultants:   Neurology   Procedures:   noe  Antimicrobials:  None  Subjective: She is feeling better. She is able to move her LE a little more.   Objective: Vitals:   03/02/20 1319 03/02/20 1948 03/03/20 0413 03/03/20 1336  BP: 126/87 135/81 130/84 129/80  Pulse: 77 91 90 69  Resp: 18 17 17 18   Temp: 98.4 F (36.9 C) 98 F (36.7 C) 98.1 F (36.7 C) 98.2 F (36.8 C)  TempSrc: Oral  Oral Oral  SpO2: 94% 95% 96%   Weight:      Height:  Intake/Output Summary (Last 24 hours) at 03/03/2020 1458 Last data filed at 03/03/2020 1342 Gross per 24 hour  Intake 287.31 ml  Output 700 ml  Net -412.69 ml   Filed Weights   02/29/20 1713  Weight: 45.8 kg    Examination:  General exam: NAD Respiratory system: CTA Cardiovascular system: S 1,  S 2 RRR Gastrointestinal system: BS present, soft, nt Central nervous system: Alert, BL LE weakness 4/5 Extremities: no edema    Data Reviewed: I have personally reviewed following labs and imaging studies  CBC: Recent Labs  Lab 02/29/20 1851 03/01/20 0552 03/02/20 0917  WBC 11.0* 10.2 10.7*  NEUTROABS 7.9*  --   --   HGB 15.1* 15.3* 15.7*  HCT 44.5 43.3 46.3*  MCV 97.6 96.2 96.5  PLT 277 263 [id number]   Basic Metabolic  Panel: Recent Labs  Lab 02/29/20 1851 03/02/20 0917  NA 137 139  K 4.0 4.1  CL 100 104  CO2 29 21*  GLUCOSE 107* 211*  BUN 12 13  CREATININE 0.53 0.73  CALCIUM 9.3 9.3   GFR: Estimated Creatinine Clearance: 52 mL/min (by C-G formula based on SCr of 0.73 mg/dL). Liver Function Tests: Recent Labs  Lab 02/29/20 1851  AST 17  ALT 16  ALKPHOS 76  BILITOT 0.3  PROT 6.7  ALBUMIN 3.7   No results for input(s): LIPASE, AMYLASE in the last 168 hours. No results for input(s): AMMONIA in the last 168 hours. Coagulation Profile: No results for input(s): INR, PROTIME in the last 168 hours. Cardiac Enzymes: Recent Labs  Lab 02/29/20 1851  CKTOTAL 34*   BNP (last 3 results) No results for input(s): PROBNP in the last 8760 hours. HbA1C: No results for input(s): HGBA1C in the last 72 hours. CBG: Recent Labs  Lab 03/02/20 1302 03/02/20 1820 03/02/20 1951 03/03/20 0749 03/03/20 1204  GLUCAP 140* 147* 174* 85 87   Lipid Profile: No results for input(s): CHOL, HDL, LDLCALC, TRIG, CHOLHDL, LDLDIRECT in the last 72 hours. Thyroid Function Tests: No results for input(s): TSH, T4TOTAL, FREET4, T3FREE, THYROIDAB in the last 72 hours. Anemia Panel: Recent Labs    03/01/20 0552  VITAMINB12 448  FOLATE 15.7   Sepsis Labs: No results for input(s): PROCALCITON, LATICACIDVEN in the last 168 hours.  Recent Results (from the past 240 hour(s))  Resp Panel by RT-PCR (Flu A&B, Covid) Nasopharyngeal Swab     Status: None   Collection Time: 02/29/20  9:05 PM   Specimen: Nasopharyngeal Swab; Nasopharyngeal(NP) swabs in vial transport medium  Result Value Ref Range Status   SARS Coronavirus 2 by RT PCR NEGATIVE NEGATIVE Final    Comment: (NOTE) SARS-CoV-2 target nucleic acids are NOT DETECTED.  The SARS-CoV-2 RNA is generally detectable in upper respiratory specimens during the acute phase of infection. The lowest concentration of SARS-CoV-2 viral copies this assay can detect  is 138 copies/mL. A negative result does not preclude SARS-Cov-2 infection and should not be used as the sole basis for treatment or other patient management decisions. A negative result may occur with  improper specimen collection/handling, submission of specimen other than nasopharyngeal swab, presence of viral mutation(s) within the areas targeted by this assay, and inadequate number of viral copies(<138 copies/mL). A negative result must be combined with clinical observations, patient history, and epidemiological information. The expected result is Negative.  Fact Sheet for Patients:  EntrepreneurPulse.com.au  Fact Sheet for Healthcare Providers:  IncredibleEmployment.be  This test is no t yet approved or cleared by the [place]  States FDA and  has been authorized for detection and/or diagnosis of SARS-CoV-2 by FDA under an Emergency Use Authorization (EUA). This EUA will remain  in effect (meaning this test can be used) for the duration of the COVID-19 declaration under Section 564(b)(1) of the Act, 21 U.S.C.section 360bbb-3(b)(1), unless the authorization is terminated  or revoked sooner.       Influenza A by PCR NEGATIVE NEGATIVE Final   Influenza B by PCR NEGATIVE NEGATIVE Final    Comment: (NOTE) The Xpert Xpress SARS-CoV-2/FLU/RSV plus assay is intended as an aid in the diagnosis of influenza from Nasopharyngeal swab specimens and should not be used as a sole basis for treatment. Nasal washings and aspirates are unacceptable for Xpert Xpress SARS-CoV-2/FLU/RSV testing.  Fact Sheet for Patients: EntrepreneurPulse.com.au  Fact Sheet for Healthcare Providers: IncredibleEmployment.be  This test is not yet approved or cleared by the [place] FDA and has been authorized for detection and/or diagnosis of SARS-CoV-2 by FDA under an Emergency Use Authorization (EUA). This EUA will remain in effect  (meaning this test can be used) for the duration of the COVID-19 declaration under Section 564(b)(1) of the Act, 21 U.S.C. section 360bbb-3(b)(1), unless the authorization is terminated or revoked.  Performed at [facility], [address] [place], [place] [address]          Radiology Studies: No results found.      Scheduled Meds: . atorvastatin  20 mg Oral Daily  . baclofen  10 mg Oral TID  . celecoxib  200 mg Oral BID  . cholecalciferol  2,000 Units Oral Daily  . [START ON 03/04/2020] feeding supplement  237 mL Oral QHS  . gabapentin  600 mg Oral TID  . multivitamin with minerals  1 tablet Oral Daily  . nicotine  7 mg Transdermal Daily  . nutrition supplement (JUVEN)  1 packet Oral BID BM  . propranolol  40 mg Oral BID  . senna  1 tablet Oral Daily   Continuous Infusions: . sodium chloride 250 mL (03/01/20 2229)  . methylPREDNISolone (SOLU-MEDROL) injection 1,000 mg (03/01/20 2230)     LOS: 2 days    Time spent: 35 minutes.     Elmarie Shiley, MD [facility]   If 7PM-7AM, please contact night-coverage [email or web address]  03/03/2020, 2:58 PM

## 2020-03-03 NOTE — Evaluation (Signed)
Occupational Therapy Evaluation Patient Details Name: Faith [name] MRN: [id number] DOB: 11-19-1955 Today's Date: 03/03/2020    History of Present Illness Faith [name] is a 65 y.o. female with medical history significant of relapsing remitting MS, cervical spinal stenosis with myelopathy status post laminectomy in 2020, COPD, hypertension, hyperlipidemia, tobacco use presented to [facility] ED with complaints of bilateral lower extremity weakness and weakness with neck extension.   Clinical Impression   Pt with decline in function and safety with ADLs and ADL mobility with impaired strength, balance and endurance. Pt with hx of MS and requires assist at home from [place] and her son for LB selfcare and home mgt. Pt uses an power w/c but also has a rollater that she using in the house. Pt was able to feed and toilet herself, Ind with UB selfcare. Pt currently required assist with UB ADLs, toileting and transfers. Pt would benefit from acute OT services to address impairments to maximize level of function and safety    Follow Up Recommendations  Home health OT    Equipment Recommendations  None recommended by OT    Recommendations for Other Services       Precautions / Restrictions Precautions Precautions: Fall Precaution Comments: mod fall Restrictions Weight Bearing Restrictions: No      Mobility Bed Mobility Overal bed mobility: Needs Assistance Bed Mobility: Supine to Sit;Sit to Supine     Supine to sit: Min assist Sit to supine: Min assist   General bed mobility comments: min A with LEs and to elevate trunk, increased time and effort    Transfers Overall transfer level: Needs assistance Equipment used: Rolling walker (2 wheeled) Transfers: Sit to/from Stand Sit to Stand: Mod assist;From elevated surface         General transfer comment: requires assist to boost up into standing    Balance Overall balance assessment: Needs assistance Sitting-balance support: Feet  supported Sitting balance-Leahy Scale: Fair     Standing balance support: Bilateral upper extremity supported;During functional activity Standing balance-Leahy Scale: Poor Standing balance comment: reliant on RW and external assist to prevent falling                           ADL either performed or assessed with clinical judgement   ADL Overall ADL's : Needs assistance/impaired Eating/Feeding: Independent;Set up;Sitting   Grooming: Wash/dry hands;Wash/dry face;Min guard;Standing   Upper Body Bathing: Min guard;Sitting   Lower Body Bathing: Maximal assistance (baseline)   Upper Body Dressing : Min guard;Sitting   Lower Body Dressing: Maximal assistance (baseline)   Toilet Transfer: Moderate assistance;Ambulation;Stand-pivot   Toileting- Clothing Manipulation and Hygiene: Moderate assistance;Sit to/from stand       Functional mobility during ADLs: Moderate assistance;Rolling walker       Vision Baseline Vision/History: Wears glasses Patient Visual Report: No change from baseline       Perception     Praxis      Pertinent Vitals/Pain Pain Assessment: 0-10 Pain Score: 2  Faces Pain Scale: Hurts a little bit Pain Location: bottom Pain Descriptors / Indicators: Grimacing;Guarding Pain Intervention(s): Monitored during session;Repositioned     Hand Dominance Right   Extremity/Trunk Assessment Upper Extremity Assessment Upper Extremity Assessment: Generalized weakness   Lower Extremity Assessment Lower Extremity Assessment: Defer to PT evaluation   Cervical / Trunk Assessment Cervical / Trunk Assessment: Normal   Communication Communication Communication: No difficulties   Cognition Arousal/Alertness: Awake/alert Behavior During Therapy: WFL for tasks assessed/performed  Overall Cognitive Status: Within Functional Limits for tasks assessed                                     General Comments       Exercises     Shoulder  Instructions      Home Living Family/patient expects to be discharged to:: Private residence Living Arrangements: Children Available Help at Discharge: Family;Personal care attendant (has PCA 4 days wk x 3 hrs/day) Type of Home: House Home Access: Ramped entrance     Home Layout: Two level;Able to live on main level with bedroom/bathroom     Bathroom Shower/Tub: Producer, television/film/video: Standard     Home Equipment: Environmental consultant - 4 wheels;Wheelchair - power;Electric scooter;Hospital bed;Bedside commode;Shower seat          Prior Functioning/Environment Level of Independence: Needs assistance  Gait / Transfers Assistance Needed: Patient reports she ambulates inside home with rollator, uses wheelchair when out of home. ADL's / Homemaking Assistance Needed: Son assists and PCA 3x per week for 3 hours a day for lLB bathing, LB dressing, cooking and home mgt            OT Problem List: Decreased strength;Impaired balance (sitting and/or standing);Impaired tone;Decreased activity tolerance;Decreased coordination;Decreased knowledge of use of DME or AE      OT Treatment/Interventions: Self-care/ADL training;Therapeutic exercise;Neuromuscular education;DME and/or AE instruction;Balance training;Patient/family education    OT Goals(Current goals can be found in the care plan section) Acute Rehab OT Goals Patient Stated Goal: go home OT Goal Formulation: With patient Time For Goal Achievement: 03/17/20 Potential to Achieve Goals: Good ADL Goals Pt Will Perform Grooming: with supervision;with set-up;standing;with caregiver independent in assisting Pt Will Perform Upper Body Bathing: with supervision;with set-up;sitting;with caregiver independent in assisting Pt Will Perform Upper Body Dressing: with supervision;with set-up;sitting;with caregiver independent in assisting Pt Will Transfer to Toilet: with min assist;with min guard assist;ambulating;bedside commode Pt Will Perform  Toileting - Clothing Manipulation and hygiene: with min assist;with min guard assist;sit to/from stand  OT Frequency: Min 2X/week   Barriers to D/C:            Co-evaluation              AM-PAC OT "6 Clicks" Daily Activity     Outcome Measure Help from another person eating meals?: None Help from another person taking care of personal grooming?: A Little Help from another person toileting, which includes using toliet, bedpan, or urinal?: A Lot Help from another person bathing (including washing, rinsing, drying)?: A Lot Help from another person to put on and taking off regular upper body clothing?: A Little Help from another person to put on and taking off regular lower body clothing?: A Lot 6 Click Score: 16   End of Session Equipment Utilized During Treatment: Gait belt;Rolling walker;Other (comment) (BSC)  Activity Tolerance: Patient limited by fatigue Patient left: in bed;with call bell/phone within reach  OT Visit Diagnosis: Unsteadiness on feet (R26.81);Other abnormalities of gait and mobility (R26.89);History of falling (Z91.81);Other symptoms and signs involving the nervous system (R29.898)                Time: 1341-1406 OT Time Calculation (min): 25 min Charges:  OT General Charges $OT Visit: 1 Visit OT Evaluation $OT Eval Moderate Complexity: 1 Mod OT Treatments $Self Care/Home Management : 8-22 mins    Galen Manila 03/03/2020, 3:56 PM

## 2020-03-04 ENCOUNTER — Inpatient Hospital Stay (HOSPITAL_COMMUNITY): Payer: Medicaid Other

## 2020-03-04 DIAGNOSIS — R531 Weakness: Secondary | ICD-10-CM | POA: Diagnosis not present

## 2020-03-04 DIAGNOSIS — D45 Polycythemia vera: Secondary | ICD-10-CM | POA: Diagnosis not present

## 2020-03-04 DIAGNOSIS — R29898 Other symptoms and signs involving the musculoskeletal system: Secondary | ICD-10-CM | POA: Diagnosis not present

## 2020-03-04 LAB — COMPREHENSIVE METABOLIC PANEL
ALT: 14 U/L (ref 0–44)
AST: 18 U/L (ref 15–41)
Albumin: 3.2 g/dL — ABNORMAL LOW (ref 3.5–5.0)
Alkaline Phosphatase: 63 U/L (ref 38–126)
Anion gap: 9 (ref 5–15)
BUN: 21 mg/dL (ref 8–23)
CO2: 26 mmol/L (ref 22–32)
Calcium: 8.9 mg/dL (ref 8.9–10.3)
Chloride: 101 mmol/L (ref 98–111)
Creatinine, Ser: 0.57 mg/dL (ref 0.44–1.00)
GFR, Estimated: >60 mL/min (ref >60)
Glucose, Bld: 131 mg/dL — ABNORMAL HIGH (ref 70–99)
Potassium: 4.1 mmol/L (ref 3.5–5.1)
Sodium: 136 mmol/L (ref 135–145)
Total Bilirubin: 0.5 mg/dL (ref 0.3–1.2)
Total Protein: 6.2 g/dL — ABNORMAL LOW (ref 6.5–8.1)

## 2020-03-04 LAB — CBC
HCT: 41.6 % (ref 36.0–46.0)
Hemoglobin: 14.7 g/dL (ref 12.0–15.0)
MCH: 33.8 pg (ref 26.0–34.0)
MCHC: 35.3 g/dL (ref 30.0–36.0)
MCV: 95.6 fL (ref 80.0–100.0)
Platelets: 295 10*3/uL (ref 150–400)
RBC: 4.35 MIL/uL (ref 3.87–5.11)
RDW: 13 % (ref 11.5–15.5)
WBC: 11.2 10*3/uL — ABNORMAL HIGH (ref 4.0–10.5)
nRBC: 0 % (ref 0.0–0.2)

## 2020-03-04 LAB — GLUCOSE, CAPILLARY
Glucose-Capillary: 124 mg/dL — ABNORMAL HIGH (ref 70–99)
Glucose-Capillary: 127 mg/dL — ABNORMAL HIGH (ref 70–99)
Glucose-Capillary: 123 mg/dL — ABNORMAL HIGH (ref 70–99)
Glucose-Capillary: 173 mg/dL — ABNORMAL HIGH (ref 70–99)

## 2020-03-04 MED ORDER — VITAMIN B-12 1000 MCG PO TABS
1000.0000 ug | ORAL_TABLET | Freq: Every day | ORAL | Status: DC
  Administered 2020-03-04 – 2020-03-05 (×2): 1000 ug via ORAL
  Filled 2020-03-04 (×2): qty 1

## 2020-03-04 MED ORDER — SODIUM CHLORIDE 0.9 % IV SOLN
1000.0000 mg | Freq: Once | INTRAVENOUS | Status: AC
  Administered 2020-03-05: 1000 mg via INTRAVENOUS
  Filled 2020-03-04: qty 8

## 2020-03-04 MED ORDER — SODIUM CHLORIDE 0.9 % IV SOLN
1000.0000 mg | Freq: Once | INTRAVENOUS | Status: AC
  Administered 2020-03-04: 1000 mg via INTRAVENOUS
  Filled 2020-03-04: qty 8

## 2020-03-04 NOTE — Progress Notes (Signed)
PROGRESS NOTE    Faith [name]  OZD:[id number] DOB: August 15, 1955 DOA: 02/29/2020 PCP: Wilson Singer, MD   Brief Narrative: 65 year old with past medical history significant for relapsing remitting MS, cervical spinal stenosis with myelopathy status post laminectomy in 2020, COPD, hypertension, hyperlipidemia, tobacco use presented to [facility], ED with complaints of bilateral lower extremity weakness.  Tele neurology was consulted and recommended obtaining MRI of the brain and C-spine for further evaluation.  Patient was transferred to [facility] for MRI.  Patient reports weakness started to get worse over a week.  She uses an electric wheelchair at baseline but is normally able to use rolling walker when ambulating in the house.  Over the last week she had no being able to use her rolling walker to ambulate in the house.  She has been having difficulty keeping her head up and is looking down a lot.  -MRI brain and C ervical spine no new MS lesions.  -MRI thoracic and Lumbar spine. No cord compression, extensive chronic demyelinating diseases. -Evaluated By Dr Otelia Limes (neurology),  who think patient has MS flare. Patient was started on High dose IV solumedrol, she will complete 5th day on 1/05.  -LE [place] order by neurology on 1/04 to rule out DVT/      Assessment & Plan:   Principal Problem:   Bilateral leg weakness Active Problems:   Tobacco use   Hyperlipidemia   COPD (chronic obstructive pulmonary disease) (HCC)   HTN (hypertension)   1-Right Lower Extremity Weakness: MS flare.  -Differential: cord compression, vs MS flare, Vs GBS.  -MRI brain and C ervical spine no new MS lesions.  -MRI thoracic and Lumbar spine. No cord compression, extensive chronic demyelinating diseases.  -Follow; Zinc , Copper ; B 12 448, TSH , folate 15 and thiamine pending, HIV negative.  -Evaluated By Dr Otelia Limes, who think patient has MS flare. MRI thoracic and lumbar spine less accurate to diagnose new  MS plaque, because it was done without contrast.  -Patient started on IV high dose solumedrol. Day 4/5 -Neurology recommend checking LE [place] to rule out DVT.   MS;  Continue with baclofen, gabapentin, valium.   COPD: Continue with duoneb PRN  Tobacco use Started on nicotine patch.   Mild leukocytosis; follow trend.  Resolved. UA negative.   Hypertension -Continue with Propanolol.   Hyperlipidemia -Continue with Lipitor.   Right buttock pressure injury, 5 cm x 2 x 0.2 cm.  Present prior to admission.    Continue with wound care    Estimated body mass index is 17.34 kg/m as calculated from the following:   Height as of this encounter: 5\' 4"  (1.626 m).   Weight as of this encounter: 45.8 kg.   DVT prophylaxis: scd, might need LP hold anticoagulation.  Code Status: Full code Family Communication: care discussed with patient, daughter over phone 1/02 Disposition Plan:  Status is: inpatient.   Dispo: The patient is from: Home              Anticipated d/c is to: Home              Anticipated d/c date is: 1 day              Patient currently is not medically stable to d/c.Might be able to be discharge 1/05 if clear by neurology         Consultants:   Neurology   Procedures:   noe  Antimicrobials:  None  Subjective: She  is feeling better, LE weakness has improved.  She had BM today.   Objective: Vitals:   03/03/20 0413 03/03/20 1336 03/03/20 2006 03/04/20 0433  BP: 130/84 129/80 117/77 132/85  Pulse: 90 69 74 71  Resp: 17 18 18 17   Temp: 98.1 F (36.7 C) 98.2 F (36.8 C) 97.7 F (36.5 C) (!) 97.5 F (36.4 C)  TempSrc: Oral Oral Oral   SpO2: 96% 96% 98% 100%  Weight:      Height:        Intake/Output Summary (Last 24 hours) at 03/04/2020 [id number] Last data filed at 03/03/2020 1342 Gross per 24 hour  Intake 240 ml  Output 500 ml  Net -260 ml   Filed Weights   02/29/20 1713  Weight: 45.8 kg    Examination:  General exam: NAD Respiratory  system: CTA Cardiovascular system: S 1, S 2 RRR Gastrointestinal system: BS present, soft, nt Central nervous system: Alert, follows command, BL weakness improved Extremities: No edema    Data Reviewed: I have personally reviewed following labs and imaging studies  CBC: Recent Labs  Lab 02/29/20 1851 03/01/20 0552 03/02/20 0917  WBC 11.0* 10.2 10.7*  NEUTROABS 7.9*  --   --   HGB 15.1* 15.3* 15.7*  HCT 44.5 43.3 46.3*  MCV 97.6 96.2 96.5  PLT 277 263 [id number]   Basic Metabolic Panel: Recent Labs  Lab 02/29/20 1851 03/02/20 0917  NA 137 139  K 4.0 4.1  CL 100 104  CO2 29 21*  GLUCOSE 107* 211*  BUN 12 13  CREATININE 0.53 0.73  CALCIUM 9.3 9.3   GFR: Estimated Creatinine Clearance: 52 mL/min (by C-G formula based on SCr of 0.73 mg/dL). Liver Function Tests: Recent Labs  Lab 02/29/20 1851  AST 17  ALT 16  ALKPHOS 76  BILITOT 0.3  PROT 6.7  ALBUMIN 3.7   No results for input(s): LIPASE, AMYLASE in the last 168 hours. No results for input(s): AMMONIA in the last 168 hours. Coagulation Profile: No results for input(s): INR, PROTIME in the last 168 hours. Cardiac Enzymes: Recent Labs  Lab 02/29/20 1851  CKTOTAL 34*   BNP (last 3 results) No results for input(s): PROBNP in the last 8760 hours. HbA1C: No results for input(s): HGBA1C in the last 72 hours. CBG: Recent Labs  Lab 03/03/20 0749 03/03/20 1204 03/03/20 1642 03/03/20 2010 03/04/20 0748  GLUCAP 85 87 92 111* 124*   Lipid Profile: No results for input(s): CHOL, HDL, LDLCALC, TRIG, CHOLHDL, LDLDIRECT in the last 72 hours. Thyroid Function Tests: No results for input(s): TSH, T4TOTAL, FREET4, T3FREE, THYROIDAB in the last 72 hours. Anemia Panel: No results for input(s): VITAMINB12, FOLATE, FERRITIN, TIBC, IRON, RETICCTPCT in the last 72 hours. Sepsis Labs: No results for input(s): PROCALCITON, LATICACIDVEN in the last 168 hours.  Recent Results (from the past 240 hour(s))  Resp Panel by  RT-PCR (Flu A&B, Covid) Nasopharyngeal Swab     Status: None   Collection Time: 02/29/20  9:05 PM   Specimen: Nasopharyngeal Swab; Nasopharyngeal(NP) swabs in vial transport medium  Result Value Ref Range Status   SARS Coronavirus 2 by RT PCR NEGATIVE NEGATIVE Final    Comment: (NOTE) SARS-CoV-2 target nucleic acids are NOT DETECTED.  The SARS-CoV-2 RNA is generally detectable in upper respiratory specimens during the acute phase of infection. The lowest concentration of SARS-CoV-2 viral copies this assay can detect is 138 copies/mL. A negative result does not preclude SARS-Cov-2 infection and should not be used as the sole  basis for treatment or other patient management decisions. A negative result may occur with  improper specimen collection/handling, submission of specimen other than nasopharyngeal swab, presence of viral mutation(s) within the areas targeted by this assay, and inadequate number of viral copies(<138 copies/mL). A negative result must be combined with clinical observations, patient history, and epidemiological information. The expected result is Negative.  Fact Sheet for Patients:  EntrepreneurPulse.com.au  Fact Sheet for Healthcare Providers:  IncredibleEmployment.be  This test is no t yet approved or cleared by the [place] FDA and  has been authorized for detection and/or diagnosis of SARS-CoV-2 by FDA under an Emergency Use Authorization (EUA). This EUA will remain  in effect (meaning this test can be used) for the duration of the COVID-19 declaration under Section 564(b)(1) of the Act, 21 U.S.C.section 360bbb-3(b)(1), unless the authorization is terminated  or revoked sooner.       Influenza A by PCR NEGATIVE NEGATIVE Final   Influenza B by PCR NEGATIVE NEGATIVE Final    Comment: (NOTE) The Xpert Xpress SARS-CoV-2/FLU/RSV plus assay is intended as an aid in the diagnosis of influenza from Nasopharyngeal swab  specimens and should not be used as a sole basis for treatment. Nasal washings and aspirates are unacceptable for Xpert Xpress SARS-CoV-2/FLU/RSV testing.  Fact Sheet for Patients: EntrepreneurPulse.com.au  Fact Sheet for Healthcare Providers: IncredibleEmployment.be  This test is not yet approved or cleared by the [place] FDA and has been authorized for detection and/or diagnosis of SARS-CoV-2 by FDA under an Emergency Use Authorization (EUA). This EUA will remain in effect (meaning this test can be used) for the duration of the COVID-19 declaration under Section 564(b)(1) of the Act, 21 U.S.C. section 360bbb-3(b)(1), unless the authorization is terminated or revoked.  Performed at [facility], [address] [place], [place] [address]          Radiology Studies: No results found.      Scheduled Meds: . atorvastatin  20 mg Oral Daily  . baclofen  10 mg Oral TID  . celecoxib  200 mg Oral BID  . cholecalciferol  2,000 Units Oral Daily  . feeding supplement  237 mL Oral QHS  . gabapentin  600 mg Oral TID  . multivitamin with minerals  1 tablet Oral Daily  . nicotine  7 mg Transdermal Daily  . nutrition supplement (JUVEN)  1 packet Oral BID BM  . propranolol  40 mg Oral BID  . senna  1 tablet Oral Daily  . vitamin B-12  1,000 mcg Oral Daily   Continuous Infusions: . sodium chloride 250 mL (03/01/20 2229)  . methylPREDNISolone (SOLU-MEDROL) injection 1,000 mg (03/03/20 1700)     LOS: 3 days    Time spent: 35 minutes.     Elmarie Shiley, MD [facility]   If 7PM-7AM, please contact night-coverage [email or web address]  03/04/2020, 8:08 AM

## 2020-03-04 NOTE — Progress Notes (Addendum)
Physical Therapy Treatment Patient Details Name: ALIEGHA PAULLIN MRN: [id number] DOB: 07/03/1955 Today's Date: 03/04/2020    History of Present Illness Keirstin A Deshotel is a 65 y.o. female with medical history significant of relapsing remitting MS, cervical spinal stenosis with myelopathy status post laminectomy in 2020, COPD, hypertension, hyperlipidemia, tobacco use presented to [facility] ED with complaints of bilateral lower extremity weakness and weakness with neck extension.    PT Comments    Pt is progressing well with therapy, walking much further today (almost too much as she fatigued quickly on the return trip).  We discussed trying not to over fatigue or over do her physical activity requiring more frequent rest breaks.  PT will continue to follow acutely for safe mobility progression.   Follow Up Recommendations  Supervision for mobility/OOB;Home health PT     Equipment Recommendations  None recommended by PT    Recommendations for Other Services       Precautions / Restrictions Precautions Precautions: Fall    Mobility  Bed Mobility Overal bed mobility: Needs Assistance Bed Mobility: Supine to Sit;Sit to Supine     Supine to sit: Min guard Sit to supine: Min guard   General bed mobility comments: Min guard assist to prevent posterior LOB and ensure when returning to bed that bil LEs made it back as pt was very fatigued.  Transfers Overall transfer level: Needs assistance Equipment used: Rolling walker (2 wheeled) Transfers: Sit to/from [employer] Sit to Stand: Min assist Stand pivot transfers: Min assist       General transfer comment: Min assist to stand and pivot to the [facility] from the bed.  Ambulation/Gait Ambulation/Gait assistance: Min assist Gait Distance (Feet): 75 Feet (seated rest, 50', seated rest, 25') Assistive device: Rolling walker (2 wheeled) Gait Pattern/deviations: Step-through pattern;Decreased dorsiflexion - right;Decreased  dorsiflexion - left;Shuffle;Trunk flexed Gait velocity: decreased Gait velocity interpretation: 1.31 - 2.62 ft/sec, indicative of limited community ambulator General Gait Details: Pt with progressive fatigue the further we walked requiring two seated rest breaks on the second half of gait (return trip).  PT and pt discussed trying not to over fatigue herself.   Stairs             Wheelchair Mobility    Modified Rankin (Stroke Patients Only)       Balance Overall balance assessment: Needs assistance Sitting-balance support: Feet supported Sitting balance-Leahy Scale: Poor Sitting balance - Comments: needs at least one UE supported in standing, even static standing.   Standing balance support: Bilateral upper extremity supported Standing balance-Leahy Scale: Poor Standing balance comment: needs support from RW and or therapist.                            Cognition Arousal/Alertness: Awake/alert Behavior During Therapy: WFL for tasks assessed/performed Overall Cognitive Status: No family/caregiver present to determine baseline cognitive functioning                                 General Comments: No obvious cognitive impairments, may be a tad slow to process.      Exercises      General Comments        Pertinent Vitals/Pain Pain Assessment: No/denies pain    Home Living                      Prior Function  PT Goals (current goals can now be found in the care plan section) Acute Rehab PT Goals Patient Stated Goal: to be strong enough to wash the dishes at the sink Progress towards PT goals: Progressing toward goals    Frequency    Min 3X/week      PT Plan Discharge plan needs to be updated    Co-evaluation              AM-PAC PT "6 Clicks" Mobility   Outcome Measure  Help needed turning from your back to your side while in a flat bed without using bedrails?: None Help needed moving from lying  on your back to sitting on the side of a flat bed without using bedrails?: A Little Help needed moving to and from a bed to a chair (including a wheelchair)?: A Little Help needed standing up from a chair using your arms (e.g., wheelchair or bedside chair)?: A Little Help needed to walk in hospital room?: A Little Help needed climbing 3-5 steps with a railing? : A Lot 6 Click Score: 18    End of Session   Activity Tolerance: Patient limited by fatigue Patient left: in bed;with call bell/phone within reach Nurse Communication: Mobility status;Other (comment) (updated d/c recs) PT Visit Diagnosis: Unsteadiness on feet (R26.81);Muscle weakness (generalized) (M62.81);Other abnormalities of gait and mobility (R26.89);Difficulty in walking, not elsewhere classified (R26.2);History of falling (Z91.81)     Time: 1310-1340 PT Time Calculation (min) (ACUTE ONLY): 30 min  Charges:  $Gait Training: 8-22 mins $Therapeutic Activity: 8-22 mins                    Verdene Lennert, PT, DPT  Acute Rehabilitation [phone] pager [phone]) [phone] office

## 2020-03-04 NOTE — Progress Notes (Signed)
NEUROLOGY CONSULTATION PROGRESS NOTE   Date of service: March 04, 2020 Patient Name: Faith [name] MRN:  [id number] DOB:  Mar 12, 1955  Brief HPI  Faith [name] is a 65 y.o. female with PMH significant for COPD, hyperlipidemia, narcolepsy, history of PPMS, history of C-spine compressive myelopathy status post decompression with residual bilateral lower extremity weakness who was admitted with acute on chronic worsening of bilateral lower extremity weakness.  Work-up with MRI brain, C, T, L-spine with and without contrast is nonrevealing, no enhancing lesions, no cord compression.  Suspect progression of her MS for which she got 5 days of IV Solu-Medrol with the last dose scheduled for 03/05/2020.   Interval Hx   She endorses significant improvement in her bilateral lower extremity weakness and is now able to lift both legs off the bed.  Vitals   Vitals:   03/03/20 0413 03/03/20 1336 03/03/20 2006 03/04/20 0433  BP: 130/84 129/80 117/77 132/85  Pulse: 90 69 74 71  Resp: 17 18 18 17   Temp: 98.1 F (36.7 C) 98.2 F (36.8 C) 97.7 F (36.5 C) (!) 97.5 F (36.4 C)  TempSrc: Oral Oral Oral   SpO2: 96% 96% 98% 100%  Weight:      Height:         Body mass index is 17.34 kg/m.  Physical Exam   General: Laying comfortably in bed; in no acute distress.  HENT: Normal oropharynx and mucosa. Normal external appearance of ears and nose.  Neck: Supple, no pain or tenderness  CV: No JVD. No peripheral edema.  Pulmonary: Symmetric Chest rise. Normal respiratory effort.  Abdomen: Soft to touch, non-tender.  Ext: No cyanosis, edema, or deformity  Skin: No rash. Normal palpation of skin.   Musculoskeletal: Normal digits and nails by inspection. No clubbing.   Neurologic Examination  Mental status/Cognition: Alert, oriented to self, place, month and year, good attention.  Speech/language: Fluent, comprehension intact, object naming intact, repetition intact.  Cranial nerves:   CN II Pupils equal  and reactive to light, no VF deficits    CN III,IV,VI EOM intact, no gaze preference or deviation, no nystagmus    CN V normal sensation in V1, V2, and V3 segments bilaterally    CN VII no asymmetry, no nasolabial fold flattening    CN VIII normal hearing to speech    CN IX & X normal palatal elevation, no uvular deviation    CN XI 5/5 head turn and 5/5 shoulder shrug bilaterally    CN XII midline tongue protrusion    Motor:  Muscle bulk: poor, tone increased, pronator drift none tremor yes, BL upper extremity action tremor. Mvmt Root Nerve  Muscle Right Left Comments  SA C5/6 Ax Deltoid 5 5   EF C5/6 Mc Biceps 5 5   EE C6/7/8 Rad Triceps 5 5   WF C6/7 Med FCR 5 5   WE C7/8 PIN ECU 5 5   F Ab C8/T1 U ADM/FDI 5 5   HF L1/2/3 Fem Illopsoas 4 4   KE L2/3/4 Fem Quad 4+ 4+   DF L4/5 D Peron Tib Ant 5 5   PF S1/2 Tibial Grc/Sol 5 5    Reflexes:  Right Left Comments  Pectoralis      Biceps (C5/6) 2+ 2+   Brachioradialis (C5/6) 2+ 2+    Triceps (C6/7) 2+ 2+    Patellar (L3/4) 3 3    Achilles (S1) 4 3 Sustained clonus in R foot only today   Hoffman  Plantar     Jaw jerk    Sensation:  Light touch Intact BL   Pin prick    Temperature    Vibration   Proprioception    Coordination/Complex Motor:  - Finger to Nose with ataxia and tremors BL - Heel to shin with ataxia, unable to bring the foot all the way upto the knee thou - Rapid alternating movement are slowed. - Gait: deferred.  Labs   Basic Metabolic Panel:  Lab Results  Component Value Date   NA 139 03/02/2020   K 4.1 03/02/2020   CO2 21 (L) 03/02/2020   GLUCOSE 211 (H) 03/02/2020   BUN 13 03/02/2020   CREATININE 0.73 03/02/2020   CALCIUM 9.3 03/02/2020   GFRNONAA >60 03/02/2020   GFRAA 117 11/28/2019   HbA1c: No results found for: HGBA1C LDL:  Lab Results  Component Value Date   LDLCALC 97 11/28/2019   Urine Drug Screen: No results found for: LABOPIA, COCAINSCRNUR, LABBENZ, AMPHETMU, THCU, LABBARB   Alcohol Level     Component Value Date/Time   [facility]  01/28/2009 0158    <5        LOWEST DETECTABLE LIMIT FOR SERUM ALCOHOL IS 5 mg/dL FOR MEDICAL PURPOSES ONLY   No results found for: PHENYTOIN, ZONISAMIDE, LAMOTRIGINE, LEVETIRACETA No results found for: PHENYTOIN, PHENOBARB, VALPROATE, CBMZ  Imaging and Diagnostic studies  MRI Brain and C spine with and without contrast: 1. Unchanged distribution of cerebral white matter lesions in a pattern consistent with multiple sclerosis. No new or active demyelinating lesions. 2. Focus of hyperintense T2-weighted signal within the right dorsal spinal cord at the C4 level is more clearly visible on the current study. This could be due to demyelination or prior cord compression. 3. No active demyelinating lesions of the cervical spine. 4. C3-4 anterior fusion with widely patent spinal canal. 5. Severe left C7 foraminal stenosis and moderate stenosis at left C3 and bilateral C6.  MRI T and L spine with and without contrast: 1. Extensive chronic appearing demyelinating disease in the thoracic spinal cord with cord volume loss. No cord expansion or delayed postcontrast enhancement (s/p gadolinium contrast at 0130 hours today) to suggest active demyelination.  2. Mild for age superimposed thoracic spine degeneration. No thoracic spinal stenosis.  1. Cauda equina nerve roots appear normal. No thickening or delayed postcontrast enhancement.  2. Lumbar spine degeneration degeneration at L2-L3 through L4-L5. Mild degenerative appearing endplate marrow edema at the former. - mild L3-L4 spinal stenosis. - moderate right lateral recess stenosis at the right L3 and bilateral L5 nerve levels. - moderate to severe left L4 neural foraminal stenosis.  Impression   Faith [name] is a 65 y.o. female with acute on chronic worsening of bilateral lower extremity weakness in the setting of hx of PPMS. Improved strength significantly with IV  Methylprednisone x 5 days. Labs with low normal B12. No cord compression on MRIss, no enhancement. Likely due to progression of her PPMS.  Given labs consistent with potential polycythemia, will get BL lower ext doppler to evaluate for DVTs. DVTs can sometimes present as weakness.  Recommendations  - Continue Vit B12 replacement PO - Can discharge after 5th dose of IV solumedrol on 03/05/20. Will have her last dose scheduled for early morning. - BL lower ext doppler pending. ______________________________________________________________________   Thank you for the opportunity to take part in the care of this patient. If you have any further questions, please contact the neurology consultation attending.  Signed,  Donnetta Simpers  [facility] Pager Number [phone]

## 2020-03-04 NOTE — Progress Notes (Signed)
03/04/2020 PT Progress Note: I spoke with pt and worked with her on gait into the hallway.  She reports that if finding a HH agency that can work with her is a problem, she does believe that her son, daughter, or transport could take her to OP PT in [place] (there is a [facility] PT clinic there) if that is the only form of therapy available for her right now (RN CM is struggling to find a home therapy option).    Full PT note to follow.  Thanks,  Corinna Capra, PT, DPT  Acute Rehabilitation [phone] pager [phone] office

## 2020-03-04 NOTE — TOC Progression Note (Addendum)
Transition of Care [facility]) - Progression Note    Patient Details  Name: ASTHA PROBASCO MRN: [id number] Date of Birth: 28-Jan-1956  Transition of [facility]) CM/SW Contact  Nadene Rubins Adria Devon, RN Phone Number: 03/04/2020, 11:56 AM  Clinical Narrative:     Recalled home health agencies:   Kandee Keen with [facility] cannot accept due to staffing.   Cheryl with [facility] cannot accept due to patient's insurance.   Amy with [facility] cannot accept due to staffing.   Eber Jones with [facility] cannot accept due to location.   [facility] unable to accept due to location.   Marylene Land with [facility] unable to accept due to staffing.   [facility] unable to accept due to staffing.  [place] with [facility] were check with office. [facility] unable to accept   Spoke to [facility] with [facility], they did no cover patient's address.   Pearson Grippe with [facility] will ask office again if they can accept. They are unable to accept   [address] just called and they can accept for [facility] and PT . Asked for orders . Patient aware   [place] [facility] with [facility] cannot accept referral.  Spoke to [facility]) they have no staffing in patient's area.  Discussed with PT and MD and patient. Order placed for OP PT at [facility]. Expected Discharge Plan: Home w Home Health Services    Expected Discharge Plan and Services Expected Discharge Plan: Home w Home Health Services   Discharge Planning Services: CM Consult Post Acute Care Choice: Home Health Living arrangements for the past 2 months: Single Family Home                 DME Arranged: N/A DME Agency: NA       HH Arranged: PT HH Agency: [facility] Date HH Agency Contacted: 03/03/20 Time HH Agency Contacted: 1359 Representative spoke with at [facility] Agency: [place] will send to office for approval   Social Determinants of Health (SDOH) Interventions    Readmission Risk Interventions No  flowsheet data found.

## 2020-03-04 NOTE — CV Procedure (Signed)
BLE venous duplex completed  Results can be found under chart review under CV PROC. 03/04/2020 4:40 PM Lucillia Corson RVT, RDMS

## 2020-03-05 DIAGNOSIS — R29898 Other symptoms and signs involving the musculoskeletal system: Secondary | ICD-10-CM | POA: Diagnosis not present

## 2020-03-05 LAB — GLUCOSE, CAPILLARY: Glucose-Capillary: 109 mg/dL — ABNORMAL HIGH (ref 70–99)

## 2020-03-05 LAB — COPPER, SERUM: Copper: 130 ug/dL (ref 80–158)

## 2020-03-05 LAB — ZINC: Zinc: 54 ug/dL (ref 44–115)

## 2020-03-05 NOTE — TOC Progression Note (Signed)
Transition of Care [facility]) - Progression Note    Patient Details  Name: [name] MRN: [id number] Date of Birth: [birth year]  Transition of Care [facility]) CM/SW Contact  Nadene Rubins Adria Devon, RN Phone Number: 03/05/2020, 12:19 PM  Clinical Narrative:     Patient requesting PTAR. Nurse confirmed address . PTAR called paperwork in chart  Expected Discharge Plan: Home w Home Health Services    Expected Discharge Plan and Services Expected Discharge Plan: Home w Home Health Services   Discharge Planning Services: CM Consult Post Acute Care Choice: Home Health Living arrangements for the past 2 months: Single Family Home Expected Discharge Date: 03/05/20               DME Arranged: N/A DME Agency: NA       HH Arranged: PT HH Agency: [facility] (now [facility]) Date HH Agency Contacted: 03/03/20 Time HH Agency Contacted: [id number] Representative spoke with at [facility] Agency: [place] will send to office for approval   Social Determinants of Health (SDOH) Interventions    Readmission Risk Interventions No flowsheet data found.

## 2020-03-05 NOTE — Discharge Instructions (Signed)
Fatigue If you have fatigue, you feel tired all the time and have a lack of energy or a lack of motivation. Fatigue may make it difficult to start or complete tasks because of exhaustion. In general, occasional or mild fatigue is often a normal response to activity or life. However, long-lasting (chronic) or extreme fatigue may be a symptom of a medical condition. Follow these instructions at home: General instructions  Watch your fatigue for any changes.  Go to bed and get up at the same time every day.  Avoid fatigue by pacing yourself during the day and getting enough sleep at night.  Maintain a healthy weight. Medicines  Take over-the-counter and prescription medicines only as told by your health care provider.  Take a multivitamin, if told by your health care provider.  Do not use herbal or dietary supplements unless they are approved by your health care provider. Activity   Exercise regularly, as told by your health care provider.  Use or practice techniques to help you relax, such as yoga, tai chi, meditation, or massage therapy. Eating and drinking   Avoid heavy meals in the evening.  Eat a well-balanced diet, which includes lean proteins, whole grains, plenty of fruits and vegetables, and low-fat dairy products.  Avoid consuming too much caffeine.  Avoid the use of alcohol.  Drink enough fluid to keep your urine pale yellow. Lifestyle  Change situations that cause you stress. Try to keep your work and personal schedule in balance.  Do not use any products that contain nicotine or tobacco, such as cigarettes and e-cigarettes. If you need help quitting, ask your health care provider.  Do not use drugs. Contact a health care provider if:  Your fatigue does not get better.  You have a fever.  You suddenly lose or gain weight.  You have headaches.  You have trouble falling asleep or sleeping through the night.  You feel angry, guilty, anxious, or  sad.  You are unable to have a bowel movement (constipation).  Your skin is dry.  You have swelling in your legs or another part of your body. Get help right away if:  You feel confused.  Your vision is blurry.  You feel faint or you pass out.  You have a severe headache.  You have severe pain in your abdomen, your back, or the area between your waist and hips (pelvis).  You have chest pain, shortness of breath, or an irregular or fast heartbeat.  You are unable to urinate, or you urinate less than normal.  You have abnormal bleeding, such as bleeding from the rectum, vagina, nose, lungs, or nipples.  You vomit blood.  You have thoughts about hurting yourself or others. If you ever feel like you may hurt yourself or others, or have thoughts about taking your own life, get help right away. You can go to your nearest emergency department or call:  Your local emergency services (911 in the [place]  A suicide crisis helpline, such as the [place] at 9164775271. This is open 24 hours a day. Summary  If you have fatigue, you feel tired all the time and have a lack of energy or a lack of motivation.  Fatigue may make it difficult to start or complete tasks because of exhaustion.  Long-lasting (chronic) or extreme fatigue may be a symptom of a medical condition.  Exercise regularly, as told by your health care provider.  Change situations that cause you stress. Try to keep your  work and personal schedule in balance. This information is not intended to replace advice given to you by your health care provider. Make sure you discuss any questions you have with your health care provider. Document Revised: 09/06/2018 Document Reviewed: 11/10/2016 Elsevier Patient Education  [place].   Weakness Weakness is a lack of strength. You may feel weak all over your body (generalized), or you may feel weak in one specific part of your body (focal).  Common causes of weakness include:  Infection and immune system disorders.  Physical exhaustion.  Internal bleeding or other blood loss that results in a lack of red blood cells (anemia).  Dehydration.  An imbalance in mineral (electrolyte) levels, such as potassium.  Heart disease, circulation problems, or stroke. Other causes include:  Some medicines or cancer treatment.  Stress, anxiety, or depression.  Nervous system disorders.  Thyroid disorders.  Loss of muscle strength because of age or inactivity.  Poor sleep quality or sleep disorders. The cause of your weakness may not be known. Some causes of weakness can be serious, so it is important to see your health care provider. Follow these instructions at home: Activity  Rest as needed.  Try to get enough sleep. Most adults need 7-8 hours of quality sleep each night. Talk to your health care provider about how much sleep you need each night.  Do exercises, such as arm curls and leg raises, for 30 minutes at least 2 days a week or as told by your health care provider. This helps build muscle strength.  Consider working with a physical therapist or trainer who can develop an exercise plan to help you gain muscle strength. General instructions   Take over-the-counter and prescription medicines only as told by your health care provider.  Eat a healthy, well-balanced diet. This includes: ? Proteins to build muscles, such as lean meats and fish. ? Fresh fruits and vegetables. ? Carbohydrates to boost energy, such as whole grains.  Drink enough fluid to keep your urine pale yellow.  Keep all follow-up visits as told by your health care provider. This is important. Contact a health care provider if your weakness:  Does not improve or gets worse.  Affects your ability to think clearly.  Affects your ability to do your normal daily activities. Get help right away if you:  Develop sudden weakness, especially on one  side of your face or body.  Have chest pain.  Have trouble breathing or shortness of breath.  Have problems with your vision.  Have trouble talking or swallowing.  Have trouble standing or walking.  Are light-headed or lose consciousness. Summary  Weakness is a lack of strength. You may feel weak all over your body or just in one specific part of your body.  Weakness can be caused by a variety of things. In some cases, the cause may be unknown.  Rest as needed, and try to get enough sleep. Most adults need 7-8 hours of quality sleep each night.  Eat a healthy, well-balanced diet. This information is not intended to replace advice given to you by your health care provider. Make sure you discuss any questions you have with your health care provider. Document Revised: 09/21/2017 Document Reviewed: 09/21/2017 Elsevier Patient Education  [place].

## 2020-03-05 NOTE — Progress Notes (Signed)
Occupational Therapy Treatment Patient Details Name: Faith [name] MRN: [id number] DOB: [birth year] Today's Date: 03/05/2020    History of present illness Carle A Rutten is a 65 y.o. female with medical history significant of relapsing remitting MS, cervical spinal stenosis with myelopathy status post laminectomy in 2020, COPD, hypertension, hyperlipidemia, tobacco use presented to [facility] ED with complaints of bilateral lower extremity weakness and weakness with neck extension.   OT comments  Pt making progress with functional goals and still plans to return home with [facility] therapies uopn d/c  Follow Up Recommendations  Home health OT    Equipment Recommendations  None recommended by OT    Recommendations for Other Services      Precautions / Restrictions Precautions Precautions: Fall Restrictions Weight Bearing Restrictions: No       Mobility Bed Mobility Overal bed mobility: Needs Assistance Bed Mobility: Supine to Sit;Sit to Supine     Supine to sit: Min guard Sit to supine: Independent      Transfers Overall transfer level: Needs assistance Equipment used: Rolling walker (2 wheeled) Transfers: Sit to/from [facility] Sit to Stand: Min assist Stand pivot transfers: Min assist       General transfer comment: Min assist to stand and pivot to the [facility] from the bed.    Balance Overall balance assessment: Needs assistance Sitting-balance support: Feet supported Sitting balance-Leahy Scale: Poor Sitting balance - Comments: needs at least one UE supported in standing, even static standing.   Standing balance support: Bilateral upper extremity supported Standing balance-Leahy Scale: Poor                             ADL either performed or assessed with clinical judgement   ADL Overall ADL's : Needs assistance/impaired     Grooming: Wash/dry hands;Wash/dry face;Min guard;Standing   Upper Body Bathing: Min guard;Sitting   Lower Body  Bathing: Moderate assistance;Sitting/lateral leans Lower Body Bathing Details (indicate cue type and reason): simulated Upper Body Dressing : Min guard;Sitting   Lower Body Dressing: Maximal assistance;Moderate assistance   Toilet Transfer: Ambulation;Stand-pivot;Minimal assistance;BSC;RW   Toileting- Clothing Manipulation and Hygiene: Sit to/from stand;Minimal assistance       Functional mobility during ADLs: Rolling walker;Minimal assistance       Vision Baseline Vision/History: Wears glasses Patient Visual Report: No change from baseline     Perception     Praxis      Cognition Arousal/Alertness: Awake/alert Behavior During Therapy: WFL for tasks assessed/performed Overall Cognitive Status: No family/caregiver present to determine baseline cognitive functioning                                          Exercises     Shoulder Instructions       General Comments      Pertinent Vitals/ Pain       Pain Assessment: 0-10 Pain Score: 2  Pain Location: bottom Pain Descriptors / Indicators: Grimacing;Guarding Pain Intervention(s): Monitored during session;Repositioned  Home Living Family/patient expects to be discharged to:: Private residence Living Arrangements: Children                                      Prior Functioning/Environment  Frequency  Min 2X/week        Progress Toward Goals  OT Goals(current goals can now be found in the care plan section)  Progress towards OT goals: Progressing toward goals     Plan Discharge plan remains appropriate    Co-evaluation                 AM-PAC OT "6 Clicks" Daily Activity     Outcome Measure   Help from another person eating meals?: None Help from another person taking care of personal grooming?: None Help from another person toileting, which includes using toliet, bedpan, or urinal?: A Little Help from another person bathing (including washing,  rinsing, drying)?: A Lot Help from another person to put on and taking off regular upper body clothing?: A Little Help from another person to put on and taking off regular lower body clothing?: A Lot 6 Click Score: 18    End of Session Equipment Utilized During Treatment: Gait belt;Rolling walker;Other (comment) (BSC)  OT Visit Diagnosis: Unsteadiness on feet (R26.81);Other abnormalities of gait and mobility (R26.89);History of falling (Z91.81);Other symptoms and signs involving the nervous system (R29.898)   Activity Tolerance Patient limited by fatigue   Patient Left in bed;with call bell/phone within reach   Nurse Communication          Time: 0211-1552 OT Time Calculation (min): 26 min  Charges: OT General Charges $OT Visit: 1 Visit OT Treatments $Self Care/Home Management : 8-22 mins $Therapeutic Activity: 8-22 mins     Galen Manila 03/05/2020, 1:50 PM

## 2020-03-05 NOTE — Progress Notes (Signed)
Nsg Discharge Note  Admit Date:  02/29/2020 Discharge date: 03/05/2020   Tommie Ard to be D/C'd home with [facility] per MD order.  AVS completed.  Copy for chart, and copy for patient signed, and dated. Patient/caregiver able to verbalize understanding.  Discharge Medication: Allergies as of 03/05/2020      Reactions   Darvon [propoxyphene] Other (See Comments)   Hallicuations   Oxycodone-acetaminophen    Does not have any affect on her   Penicillins Rash   Did it involve swelling of the face/tongue/throat, SOB, or low BP? Unknown Did it involve sudden or severe rash/hives, skin peeling, or any reaction on the inside of your mouth or nose? Unknown Did you need to seek medical attention at a hospital or doctor's office? Unknown When did it last happen?Unknown If all above answers are "NO", may proceed with cephalosporin use.      Medication List    TAKE these medications   atorvastatin 20 MG tablet Commonly known as: LIPITOR Take 1 tablet (20 mg total) by mouth daily.   baclofen 10 MG tablet Commonly known as: LIORESAL Take 1 tablet (10 mg total) by mouth 3 (three) times daily.   celecoxib 200 MG capsule Commonly known as: CELEBREX TAKE (1) CAPSULE BY MOUTH EVERY 12 HOURS. What changed: See the new instructions.   Cholecalciferol 50 MCG (2000 UT) Tabs Take 2,000 Int'l Units by mouth daily.   diazepam 5 MG tablet Commonly known as: VALIUM TAKE (1) TABLET BY MOUTH EVERY SIX HOURS AS NEEDED FOR MUSCLE SPASMS. At least 3x/day- need to take it. For spasms   gabapentin 300 MG capsule Commonly known as: Neurontin Take 2 capsules (600 mg total) by mouth 3 (three) times daily.   ibuprofen 800 MG tablet Commonly known as: ADVIL TAKE (1) TABLET BY MOUTH DAILY AS NEEDED. What changed: See the new instructions.   propranolol 40 MG tablet Commonly known as: INDERAL Take 40 mg by mouth 2 (two) times daily.   traMADol 50 MG tablet Commonly known as: ULTRAM Take 2 tablets (100  mg total) by mouth every 6 (six) hours as needed for moderate pain.       Discharge Assessment: Vitals:   03/04/20 2210 03/05/20 0616  BP: 127/73 (!) 154/86  Pulse: 78 68  Resp: 18 17  Temp: 98.1 F (36.7 C) 97.8 F (36.6 C)  SpO2: 98% 99%   Skin clean, dry and intact without evidence of skin break down, no evidence of skin tears noted. IV catheter discontinued intact. Site without signs and symptoms of complications - no redness or edema noted at insertion site, patient denies c/o pain - only slight tenderness at site.  Dressing with slight pressure applied.  D/c Instructions-Education: Discharge instructions given to patient/family with verbalized understanding. D/c education completed with patient/family including follow up instructions, medication list, d/c activities limitations if indicated, with other d/c instructions as indicated by MD - patient able to verbalize understanding, all questions fully answered. Patient instructed to return to ED, call 911, or call MD for any changes in condition.  Patient escorted via WC, and D/C home via ptar.  Kizzie Bane, RN 03/05/2020 1:00 PM

## 2020-03-05 NOTE — Discharge Summary (Signed)
Physician Discharge Summary  LAURIANN THATCHER [id number] DOB: April 23, 1955 DOA: 02/29/2020  PCP: Doree Albee, MD  Admit date: 02/29/2020 Discharge date: 03/05/2020  Admitted From: Home Disposition: Home  Recommendations for Outpatient Follow-up:  1. Follow up with PCP in 1-2 weeks 2. Please obtain BMP/CBC in one week 3. Please follow up with your PCP on the following pending results: Unresulted Labs (From admission, onward)          Start     Ordered   03/01/20 0410  Vitamin B1  ONCE - STAT,   STAT       Comments: Please obtain prior to administering any IV or PO thiamine   Question:  Release to patient  Answer:  Immediate   03/01/20 [place]: Yes Equipment/Devices: Walker  Discharge Condition: Stable CODE STATUS: Full code Diet recommendation: Cardiac  Subjective: Seen and examined.  Feels well.  Thinks that her weakness in lower extremities is coming back and she is very close to her baseline  Brief/Interim Summary: 65 year old with past medical history significant for relapsing remitting MS, cervical spinal stenosis with myelopathy status post laminectomy in 2020, COPD, hypertension, hyperlipidemia, tobacco use presented to [facility], ED with complaints of bilateral lower extremity weakness.  Tele neurology was consulted and recommended obtaining MRI of the brain and C-spine for further evaluation.  Patient was transferred to [facility] for MRI.  She uses an electric wheelchair at baseline but is normally able to use rolling walker when ambulating in the house.  Over the last week she had no being able to use her rolling walker to ambulate in the house.   -MRI brain and C ervical spine no new MS lesions.  -MRI thoracic and Lumbar spine. No cord compression, extensive chronic demyelinating diseases. -Evaluated By Dr Cheral Marker (neurology),  who opined that patient has MS flare. Patient was started on High dose IV solumedrol for total of 5 days, she  completed last day today.  She was also ruled out of bilateral lower extremity DVT.  Neurology has cleared the patient.  She is feeling better.  She was evaluated by PT OT who recommended home health which is being arranged for her and she is going to be discharged in stable condition.  Resume home medications.  Discharge Diagnoses:  Principal Problem:   Bilateral leg weakness Active Problems:   Tobacco use   Hyperlipidemia   COPD (chronic obstructive pulmonary disease) (HCC)   HTN (hypertension)    Discharge Instructions  Discharge Instructions    Ambulatory referral to Physical Therapy   Complete by: As directed    Iontophoresis - 4 mg/ml of dexamethasone: No   T.E.N.S. Unit Evaluation and Dispense as Indicated: No     Allergies as of 03/05/2020      Reactions   Darvon [propoxyphene] Other (See Comments)   Hallicuations   Oxycodone-acetaminophen    Does not have any affect on her   Penicillins Rash   Did it involve swelling of the face/tongue/throat, SOB, or low BP? Unknown Did it involve sudden or severe rash/hives, skin peeling, or any reaction on the inside of your mouth or nose? Unknown Did you need to seek medical attention at a hospital or doctor's office? Unknown When did it last happen?Unknown If all above answers are "NO", may proceed with cephalosporin use.      Medication List    TAKE these medications   atorvastatin 20 MG tablet Commonly  known as: LIPITOR Take 1 tablet (20 mg total) by mouth daily.   baclofen 10 MG tablet Commonly known as: LIORESAL Take 1 tablet (10 mg total) by mouth 3 (three) times daily.   celecoxib 200 MG capsule Commonly known as: CELEBREX TAKE (1) CAPSULE BY MOUTH EVERY 12 HOURS. What changed: See the new instructions.   Cholecalciferol 50 MCG (2000 UT) Tabs Take 2,000 Int'l Units by mouth daily.   diazepam 5 MG tablet Commonly known as: VALIUM TAKE (1) TABLET BY MOUTH EVERY SIX HOURS AS NEEDED FOR MUSCLE SPASMS. At  least 3x/day- need to take it. For spasms   gabapentin 300 MG capsule Commonly known as: Neurontin Take 2 capsules (600 mg total) by mouth 3 (three) times daily.   ibuprofen 800 MG tablet Commonly known as: ADVIL TAKE (1) TABLET BY MOUTH DAILY AS NEEDED. What changed: See the new instructions.   propranolol 40 MG tablet Commonly known as: INDERAL Take 40 mg by mouth 2 (two) times daily.   traMADol 50 MG tablet Commonly known as: ULTRAM Take 2 tablets (100 mg total) by mouth every 6 (six) hours as needed for moderate pain.       Follow-up [place] Follow up.   Why: [place] information: [place] [phone]        Doree Albee, MD Follow up in 1 week(s).   Specialty: Internal Medicine Contact information: [place] [address] [phone]              Allergies  Allergen Reactions  . Darvon [Propoxyphene] Other (See Comments)    Hallicuations  . Oxycodone-Acetaminophen     Does not have any affect on her  . Penicillins Rash    Did it involve swelling of the face/tongue/throat, SOB, or low BP? Unknown Did it involve sudden or severe rash/hives, skin peeling, or any reaction on the inside of your mouth or nose? Unknown Did you need to seek medical attention at a hospital or doctor's office? Unknown When did it last happen?Unknown If all above answers are "NO", may proceed with cephalosporin use.     Consultations: Neurology   Procedures/Studies: MR Brain W and Wo Contrast  Result Date: 03/01/2020 CLINICAL DATA:  Multiple sclerosis EXAM: MRI HEAD WITHOUT AND WITH CONTRAST MRI CERVICAL SPINE WITHOUT AND WITH CONTRAST TECHNIQUE: Multiplanar, multiecho pulse sequences of the brain and surrounding structures, and cervical spine, to include the craniocervical junction and cervicothoracic junction, were obtained without and with  intravenous contrast. CONTRAST:  4.39mL GADAVIST GADOBUTROL 1 MMOL/ML IV SOLN COMPARISON:  01/18/2019 FINDINGS: MRI HEAD FINDINGS Brain: No acute infarct, mass effect or extra-axial collection. No acute or chronic hemorrhage. Numerous bilateral periventricular white matter lesions and pattern unchanged compared to 01/18/2019. The midline structures are normal. There is no abnormal contrast enhancement. Vascular: Major flow voids are preserved. Skull and upper cervical spine: Unchanged left frontal osteoma Sinuses/Orbits:No paranasal sinus fluid levels or advanced mucosal thickening. No mastoid or middle ear effusion. Normal orbits. MRI CERVICAL SPINE FINDINGS Alignment: Reversal of normal cervical lordosis. Vertebrae: C3-4 anterior fusion. Cord: There is a lesion within the right dorsal spinal cord at the C4 level, more clearly visible on the current study. This could be due to demyelination or prior cord compression. No other spinal cord lesions. Posterior Fossa, vertebral arteries, paraspinal tissues: Loss of the normal left vertebral artery flow void. Disc levels: C2-3: Left  uncovertebral osteophyte causes moderate left foraminal stenosis unchanged. C3-4: Posterior decompression with anterior fusion. C4-5: Posterior decompression.  No spinal canal stenosis. C5-6: Bilateral uncovertebral hypertrophy with moderate bilateral foraminal stenosis, progressed on the right. C6-7: Left asymmetric disc bulge with bilateral uncovertebral hypertrophy. Unchanged severe left foraminal stenosis. C7-T1: No spinal canal stenosis. IMPRESSION: 1. Unchanged distribution of cerebral white matter lesions in a pattern consistent with multiple sclerosis. No new or active demyelinating lesions. 2. Focus of hyperintense T2-weighted signal within the right dorsal spinal cord at the C4 level is more clearly visible on the current study. This could be due to demyelination or prior cord compression. 3. No active demyelinating lesions of the  cervical spine. 4. C3-4 anterior fusion with widely patent spinal canal. 5. Severe left C7 foraminal stenosis and moderate stenosis at left C3 and bilateral C6. Electronically Signed   By: Deatra Robinson M.D.   On: 03/01/2020 02:23   MR THORACIC SPINE WO CONTRAST  Result Date: 03/01/2020 CLINICAL DATA:  65 year old female with a history of COPD, multiple sclerosis and cervical myelopathy. Lower extremity weakness beginning a week ago with progression. Status post brain and cervical spine MRI without and with contrast at 0130 hours today. EXAM: MRI THORACIC SPINE WITHOUT CONTRAST TECHNIQUE: Multiplanar, multisequence MR imaging of the thoracic spine was performed. No intravenous contrast was administered. COMPARISON:  Cervical spine MRI today reported separately. FINDINGS: Limited cervical spine imaging: Stable from the cervical MRI earlier today. Thoracic spine segmentation:  Appears normal. Alignment: Normal to mildly exaggerated thoracic kyphosis. No spondylolisthesis. Vertebrae: T4 benign vertebral body hemangioma redemonstrated. No marrow edema or evidence of acute osseous abnormality. Cord: No evidence of abnormal intradural enhancement on these 9-10 hour delayed postcontrast images. But there is widespread abnormal increased T2 and STIR hyperintensity within the thoracic cord, including multiple right hemi cord lesions from the cervicothoracic junction through T4 (series 42, image 12), patchy more holo cord lesions at T7, T10 (series 41, image 8), and patchy ventral lesion about 1 level above the conus at T11 (same image). There is also a left dorsal hemicord lesion at T10-T11 (series 43, image 31). Levels of associated spinal cord volume loss (series 43, image 13). No cord expansion. Heterogeneous increased signal also suspected in the conus at T12. Paraspinal and other soft tissues: Negative. Disc levels: Capacious thoracic spinal canal and mild for age spine degeneration. No significant disc herniation.  No thoracic spinal stenosis. Degenerative thoracic neural foraminal stenosis limited to the T1 and T2 nerve levels. IMPRESSION: 1. Extensive chronic appearing demyelinating disease in the thoracic spinal cord with cord volume loss. No cord expansion or delayed postcontrast enhancement (s/p gadolinium contrast at 0130 hours today) to suggest active demyelination. 2. Mild for age superimposed thoracic spine degeneration. No thoracic spinal stenosis. Electronically Signed   By: Odessa Fleming M.D.   On: 03/01/2020 11:33   MR LUMBAR SPINE WO CONTRAST  Result Date: 03/01/2020 CLINICAL DATA:  65 year old female with a history of COPD, multiple sclerosis and cervical myelopathy. Lower extremity weakness beginning a week ago with progression. Received IV gadolinium contrast about 0130 hours today 4 earlier brain and cervical spine study. EXAM: MRI LUMBAR SPINE WITHOUT CONTRAST TECHNIQUE: Multiplanar, multisequence MR imaging of the lumbar spine was performed. No intravenous contrast was administered. COMPARISON:  Cervical and thoracic spine MRI today reported separately. FINDINGS: Segmentation:  Normal, concordant with the thoracic numbering today. Alignment: Mild straightening of lumbar lordosis and dextroconvex lower lumbar scoliosis. No spondylolisthesis. Vertebrae: Patchy degenerative appearing anterior endplate  marrow edema at L2-L3. Normal background bone marrow signal. Mix of mild acute and chronic marrow signal changes at the L4-L5 endplates. Background bone marrow signal within normal limits. Intact visible sacrum and SI joints. Conus medullaris and cauda equina: Conus extends to the T12 level. Conus reported separately. Cauda equina nerve roots appear symmetric and normal. No delayed abnormal intradural enhancement. Paraspinal and other soft tissues: Negative. There is no paraspinal soft tissue inflammation. Disc levels: T12-L1:  Negative. L1-L2:  Negative aside from facet hypertrophy. L2-L3: Disc space loss.  Circumferential disc bulge and endplate spurring eccentric to the right. Mild to moderate facet hypertrophy greater on the right. Trace facet joint fluid. No spinal stenosis. Moderate right lateral recess stenosis (right L3 nerve level). Mild right L2 foraminal stenosis. L3-L4: Disc desiccation, disc space loss and circumferential disc bulge. Mild to moderate facet hypertrophy. Mild spinal and right lateral recess stenosis (right L4 nerve level). Mild right L3 foraminal stenosis. L4-L5: Disc space loss. Heterogeneous signal within the disc but no convincing endplate or paraspinal inflammation. Endplate spurring. Mild facet hypertrophy. No significant spinal stenosis. Mild to moderate bilateral lateral recess stenosis (L5 nerve levels). Moderate to severe left and mild right L4 foraminal stenosis. L5-S1: Minor disc bulging. Moderate facet hypertrophy. Mild left L5 foraminal stenosis. IMPRESSION: 1. Cauda equina nerve roots appear normal. No thickening or delayed postcontrast enhancement. 2. Lumbar spine degeneration degeneration at L2-L3 through L4-L5. Mild degenerative appearing endplate marrow edema at the former. - mild L3-L4 spinal stenosis. - moderate right lateral recess stenosis at the right L3 and bilateral L5 nerve levels. - moderate to severe left L4 neural foraminal stenosis. Electronically Signed   By: Genevie Ann M.D.   On: 03/01/2020 11:57   MR Cervical Spine W or Wo Contrast  Result Date: 03/01/2020 CLINICAL DATA:  Multiple sclerosis EXAM: MRI HEAD WITHOUT AND WITH CONTRAST MRI CERVICAL SPINE WITHOUT AND WITH CONTRAST TECHNIQUE: Multiplanar, multiecho pulse sequences of the brain and surrounding structures, and cervical spine, to include the craniocervical junction and cervicothoracic junction, were obtained without and with intravenous contrast. CONTRAST:  4.27mL GADAVIST GADOBUTROL 1 MMOL/ML IV SOLN COMPARISON:  01/18/2019 FINDINGS: MRI HEAD FINDINGS Brain: No acute infarct, mass effect or extra-axial  collection. No acute or chronic hemorrhage. Numerous bilateral periventricular white matter lesions and pattern unchanged compared to 01/18/2019. The midline structures are normal. There is no abnormal contrast enhancement. Vascular: Major flow voids are preserved. Skull and upper cervical spine: Unchanged left frontal osteoma Sinuses/Orbits:No paranasal sinus fluid levels or advanced mucosal thickening. No mastoid or middle ear effusion. Normal orbits. MRI CERVICAL SPINE FINDINGS Alignment: Reversal of normal cervical lordosis. Vertebrae: C3-4 anterior fusion. Cord: There is a lesion within the right dorsal spinal cord at the C4 level, more clearly visible on the current study. This could be due to demyelination or prior cord compression. No other spinal cord lesions. Posterior Fossa, vertebral arteries, paraspinal tissues: Loss of the normal left vertebral artery flow void. Disc levels: C2-3: Left uncovertebral osteophyte causes moderate left foraminal stenosis unchanged. C3-4: Posterior decompression with anterior fusion. C4-5: Posterior decompression.  No spinal canal stenosis. C5-6: Bilateral uncovertebral hypertrophy with moderate bilateral foraminal stenosis, progressed on the right. C6-7: Left asymmetric disc bulge with bilateral uncovertebral hypertrophy. Unchanged severe left foraminal stenosis. C7-T1: No spinal canal stenosis. IMPRESSION: 1. Unchanged distribution of cerebral white matter lesions in a pattern consistent with multiple sclerosis. No new or active demyelinating lesions. 2. Focus of hyperintense T2-weighted signal within the right dorsal spinal cord  at the C4 level is more clearly visible on the current study. This could be due to demyelination or prior cord compression. 3. No active demyelinating lesions of the cervical spine. 4. C3-4 anterior fusion with widely patent spinal canal. 5. Severe left C7 foraminal stenosis and moderate stenosis at left C3 and bilateral C6. Electronically Signed    By: Ulyses Jarred M.D.   On: 03/01/2020 02:23   VAS [place] LOWER EXTREMITY VENOUS (DVT)  Result Date: 03/04/2020  Lower Venous DVT Study Other Indications: BLE weakness with polycythemia. Comparison Study: Prior study available (12/2018) Performing Technologist: Rogelia Rohrer  Examination Guidelines: A complete evaluation includes B-mode imaging, spectral Doppler, color Doppler, and power Doppler as needed of all accessible portions of each vessel. Bilateral testing is considered an integral part of a complete examination. Limited examinations for reoccurring indications may be performed as noted. The reflux portion of the exam is performed with the patient in reverse Trendelenburg.  +---------+---------------+---------+-----------+----------+--------------+ RIGHT    CompressibilityPhasicitySpontaneityPropertiesThrombus Aging +---------+---------------+---------+-----------+----------+--------------+ CFV      Full           Yes      Yes                                 +---------+---------------+---------+-----------+----------+--------------+ SFJ      Full                                                        +---------+---------------+---------+-----------+----------+--------------+ FV Prox  Full           Yes      Yes                                 +---------+---------------+---------+-----------+----------+--------------+ FV Mid   Full           Yes      Yes                                 +---------+---------------+---------+-----------+----------+--------------+ FV DistalFull           Yes      Yes                                 +---------+---------------+---------+-----------+----------+--------------+ POP      Full           Yes      Yes                                 +---------+---------------+---------+-----------+----------+--------------+ PTV      Full                                                         +---------+---------------+---------+-----------+----------+--------------+ PERO     Full                                                        +---------+---------------+---------+-----------+----------+--------------+   +---------+---------------+---------+-----------+----------+--------------+  LEFT     CompressibilityPhasicitySpontaneityPropertiesThrombus Aging +---------+---------------+---------+-----------+----------+--------------+ CFV      Full           Yes      Yes                                 +---------+---------------+---------+-----------+----------+--------------+ SFJ      Full                                                        +---------+---------------+---------+-----------+----------+--------------+ FV Prox  Full           Yes      Yes                                 +---------+---------------+---------+-----------+----------+--------------+ FV Mid   Full           Yes      Yes                                 +---------+---------------+---------+-----------+----------+--------------+ FV DistalFull           Yes      Yes                                 +---------+---------------+---------+-----------+----------+--------------+ PFV      Full                                                        +---------+---------------+---------+-----------+----------+--------------+ POP      Full           Yes      Yes                                 +---------+---------------+---------+-----------+----------+--------------+ PTV      Full                                                        +---------+---------------+---------+-----------+----------+--------------+ PERO     Full                                                        +---------+---------------+---------+-----------+----------+--------------+     Summary: RIGHT: - There is no evidence of deep vein thrombosis in the lower extremity.  - No cystic structure found in  the popliteal fossa.  LEFT: - There is no evidence of deep vein thrombosis in the lower extremity.  - No cystic structure found in the popliteal fossa.  *See table(s) above for measurements and observations.  Preliminary       Discharge Exam: Vitals:   03/04/20 2210 03/05/20 0616  BP: 127/73 (!) 154/86  Pulse: 78 68  Resp: 18 17  Temp: 98.1 F (36.7 C) 97.8 F (36.6 C)  SpO2: 98% 99%   Vitals:   03/04/20 1237 03/04/20 1708 03/04/20 2210 03/05/20 0616  BP: 122/77 114/80 127/73 (!) 154/86  Pulse: 95 71 78 68  Resp: 18 18 18 17   Temp: 98.7 F (37.1 C) 97.7 F (36.5 C) 98.1 F (36.7 C) 97.8 F (36.6 C)  TempSrc: Oral Oral Oral Oral  SpO2: 98% 100% 98% 99%  Weight:      Height:        General: Pt is alert, awake, not in acute distress Cardiovascular: RRR, S1/S2 +, no rubs, no gallops Respiratory: CTA bilaterally, no wheezing, no rhonchi Abdominal: Soft, NT, ND, bowel sounds + Extremities: no edema, no cyanosis.  Slightly more weakness on the right lower extremity on the left.    The results of significant diagnostics from this hospitalization (including imaging, microbiology, ancillary and laboratory) are listed below for reference.     Microbiology: Recent Results (from the past 240 hour(s))  Resp Panel by RT-PCR (Flu A&B, Covid) Nasopharyngeal Swab     Status: None   Collection Time: 02/29/20  9:05 PM   Specimen: Nasopharyngeal Swab; Nasopharyngeal(NP) swabs in vial transport medium  Result Value Ref Range Status   SARS Coronavirus 2 by RT PCR NEGATIVE NEGATIVE Final    Comment: (NOTE) SARS-CoV-2 target nucleic acids are NOT DETECTED.  The SARS-CoV-2 RNA is generally detectable in upper respiratory specimens during the acute phase of infection. The lowest concentration of SARS-CoV-2 viral copies this assay can detect is 138 copies/mL. A negative result does not preclude SARS-Cov-2 infection and should not be used as the sole basis for treatment or other  patient management decisions. A negative result may occur with  improper specimen collection/handling, submission of specimen other than nasopharyngeal swab, presence of viral mutation(s) within the areas targeted by this assay, and inadequate number of viral copies(<138 copies/mL). A negative result must be combined with clinical observations, patient history, and epidemiological information. The expected result is Negative.  Fact Sheet for Patients:  EntrepreneurPulse.com.au  Fact Sheet for Healthcare Providers:  IncredibleEmployment.be  This test is no t yet approved or cleared by the [place] FDA and  has been authorized for detection and/or diagnosis of SARS-CoV-2 by FDA under an Emergency Use Authorization (EUA). This EUA will remain  in effect (meaning this test can be used) for the duration of the COVID-19 declaration under Section 564(b)(1) of the Act, 21 U.S.C.section 360bbb-3(b)(1), unless the authorization is terminated  or revoked sooner.       Influenza A by PCR NEGATIVE NEGATIVE Final   Influenza B by PCR NEGATIVE NEGATIVE Final    Comment: (NOTE) The Xpert Xpress SARS-CoV-2/FLU/RSV plus assay is intended as an aid in the diagnosis of influenza from Nasopharyngeal swab specimens and should not be used as a sole basis for treatment. Nasal washings and aspirates are unacceptable for Xpert Xpress SARS-CoV-2/FLU/RSV testing.  Fact Sheet for Patients: EntrepreneurPulse.com.au  Fact Sheet for Healthcare Providers: IncredibleEmployment.be  This test is not yet approved or cleared by the [place] FDA and has been authorized for detection and/or diagnosis of SARS-CoV-2 by FDA under an Emergency Use Authorization (EUA). This EUA will remain in effect (meaning this test can be used) for the duration of the COVID-19 declaration under Section 564(b)(1) of  the Act, 21 U.S.C. section  360bbb-3(b)(1), unless the authorization is terminated or revoked.  Performed at [facility], [address], [place], [place] [address]      Labs: BNP (last 3 results) No results for input(s): BNP in the last 8760 hours. Basic Metabolic Panel: Recent Labs  Lab 02/29/20 1851 03/02/20 0917 03/04/20 0952  NA 137 139 136  K 4.0 4.1 4.1  CL 100 104 101  CO2 29 21* 26  GLUCOSE 107* 211* 131*  BUN 12 13 21   CREATININE 0.53 0.73 0.57  CALCIUM 9.3 9.3 8.9   Liver Function Tests: Recent Labs  Lab 02/29/20 1851 03/04/20 0952  AST 17 18  ALT 16 14  ALKPHOS 76 63  BILITOT 0.3 0.5  PROT 6.7 6.2*  ALBUMIN 3.7 3.2*   No results for input(s): LIPASE, AMYLASE in the last 168 hours. No results for input(s): AMMONIA in the last 168 hours. CBC: Recent Labs  Lab 02/29/20 1851 03/01/20 0552 03/02/20 0917 03/04/20 0952  WBC 11.0* 10.2 10.7* 11.2*  NEUTROABS 7.9*  --   --   --   HGB 15.1* 15.3* 15.7* 14.7  HCT 44.5 43.3 46.3* 41.6  MCV 97.6 96.2 96.5 95.6  PLT 277 263 297 295   Cardiac Enzymes: Recent Labs  Lab 02/29/20 1851  CKTOTAL 34*   BNP: Invalid input(s): POCBNP CBG: Recent Labs  Lab 03/04/20 0748 03/04/20 1212 03/04/20 1647 03/04/20 2206 03/05/20 0801  GLUCAP 124* 127* 123* 173* 109*   D-Dimer No results for input(s): DDIMER in the last 72 hours. Hgb A1c No results for input(s): HGBA1C in the last 72 hours. Lipid Profile No results for input(s): CHOL, HDL, LDLCALC, TRIG, CHOLHDL, LDLDIRECT in the last 72 hours. Thyroid function studies No results for input(s): TSH, T4TOTAL, T3FREE, THYROIDAB in the last 72 hours.  Invalid input(s): FREET3 Anemia work up No results for input(s): VITAMINB12, FOLATE, FERRITIN, TIBC, IRON, RETICCTPCT in the last 72 hours. Urinalysis    Component Value Date/Time   COLORURINE YELLOW 03/01/2020 0603   APPEARANCEUR CLOUDY (A) 03/01/2020 0603   LABSPEC 1.013 03/01/2020 0603   PHURINE 8.0 03/01/2020 0603   GLUCOSEU  NEGATIVE 03/01/2020 0603   HGBUR NEGATIVE 03/01/2020 0603   BILIRUBINUR NEGATIVE 03/01/2020 0603   KETONESUR NEGATIVE 03/01/2020 0603   PROTEINUR NEGATIVE 03/01/2020 0603   NITRITE NEGATIVE 03/01/2020 0603   LEUKOCYTESUR NEGATIVE 03/01/2020 0603   Sepsis Labs Invalid input(s): PROCALCITONIN,  WBC,  LACTICIDVEN Microbiology Recent Results (from the past 240 hour(s))  Resp Panel by RT-PCR (Flu A&B, Covid) Nasopharyngeal Swab     Status: None   Collection Time: 02/29/20  9:05 PM   Specimen: Nasopharyngeal Swab; Nasopharyngeal(NP) swabs in vial transport medium  Result Value Ref Range Status   SARS Coronavirus 2 by RT PCR NEGATIVE NEGATIVE Final    Comment: (NOTE) SARS-CoV-2 target nucleic acids are NOT DETECTED.  The SARS-CoV-2 RNA is generally detectable in upper respiratory specimens during the acute phase of infection. The lowest concentration of SARS-CoV-2 viral copies this assay can detect is 138 copies/mL. A negative result does not preclude SARS-Cov-2 infection and should not be used as the sole basis for treatment or other patient management decisions. A negative result may occur with  improper specimen collection/handling, submission of specimen other than nasopharyngeal swab, presence of viral mutation(s) within the areas targeted by this assay, and inadequate number of viral copies(<138 copies/mL). A negative result must be combined with clinical observations, patient history, and epidemiological information. The expected result  is Negative.  Fact Sheet for Patients:  EntrepreneurPulse.com.au  Fact Sheet for Healthcare Providers:  IncredibleEmployment.be  This test is no t yet approved or cleared by the [place] FDA and  has been authorized for detection and/or diagnosis of SARS-CoV-2 by FDA under an Emergency Use Authorization (EUA). This EUA will remain  in effect (meaning this test can be used) for the duration of  the COVID-19 declaration under Section 564(b)(1) of the Act, 21 U.S.C.section 360bbb-3(b)(1), unless the authorization is terminated  or revoked sooner.       Influenza A by PCR NEGATIVE NEGATIVE Final   Influenza B by PCR NEGATIVE NEGATIVE Final    Comment: (NOTE) The Xpert Xpress SARS-CoV-2/FLU/RSV plus assay is intended as an aid in the diagnosis of influenza from Nasopharyngeal swab specimens and should not be used as a sole basis for treatment. Nasal washings and aspirates are unacceptable for Xpert Xpress SARS-CoV-2/FLU/RSV testing.  Fact Sheet for Patients: EntrepreneurPulse.com.au  Fact Sheet for Healthcare Providers: IncredibleEmployment.be  This test is not yet approved or cleared by the [place] FDA and has been authorized for detection and/or diagnosis of SARS-CoV-2 by FDA under an Emergency Use Authorization (EUA). This EUA will remain in effect (meaning this test can be used) for the duration of the COVID-19 declaration under Section 564(b)(1) of the Act, 21 U.S.C. section 360bbb-3(b)(1), unless the authorization is terminated or revoked.  Performed at [facility], [address], [place], [place] [address]      Time coordinating discharge: Over 30 minutes  SIGNED:   Darliss Cheney, MD  [facility] 03/05/2020, 10:43 AM  If 7PM-7AM, please contact night-coverage [email or web address]

## 2020-03-06 ENCOUNTER — Telehealth (INDEPENDENT_AMBULATORY_CARE_PROVIDER_SITE_OTHER): Payer: Self-pay

## 2020-03-08 LAB — VITAMIN B1: Vitamin B1 (Thiamine): 165.4 nmol/L (ref 66.5–200.0)

## 2020-03-10 ENCOUNTER — Telehealth (INDEPENDENT_AMBULATORY_CARE_PROVIDER_SITE_OTHER): Payer: Self-pay

## 2020-03-10 NOTE — Telephone Encounter (Signed)
Faith [name] PT called and left a detailed voice message for approval for PT: 1 X week 1, 2 X week for 2 weeks to make a total of 5 visits. Patient did have a fall over the weekend and no injuries and patient is doing well. PT wants to work on a few balance and strengthening exercises.  Please advise if OK for PT.

## 2020-03-10 NOTE — Telephone Encounter (Signed)
Yes, please go ahead and give the verbal order for any physical therapy that is required.  Thanks.

## 2020-03-10 NOTE — Telephone Encounter (Signed)
Called Verl Dicker PT with [facility] at [phone] and gave her the verbal OK for PT orders for patient per Dr. Anastasio Champion. Debbie verbalized an understanding.

## 2020-03-11 ENCOUNTER — Telehealth (INDEPENDENT_AMBULATORY_CARE_PROVIDER_SITE_OTHER): Payer: Self-pay

## 2020-03-11 DIAGNOSIS — Z79899 Other long term (current) drug therapy: Secondary | ICD-10-CM | POA: Diagnosis not present

## 2020-03-11 DIAGNOSIS — R261 Paralytic gait: Secondary | ICD-10-CM | POA: Diagnosis not present

## 2020-03-11 DIAGNOSIS — G894 Chronic pain syndrome: Secondary | ICD-10-CM | POA: Diagnosis not present

## 2020-03-11 DIAGNOSIS — G250 Essential tremor: Secondary | ICD-10-CM | POA: Diagnosis not present

## 2020-03-11 DIAGNOSIS — R296 Repeated falls: Secondary | ICD-10-CM | POA: Diagnosis not present

## 2020-03-11 DIAGNOSIS — I952 Hypotension due to drugs: Secondary | ICD-10-CM | POA: Diagnosis not present

## 2020-03-11 DIAGNOSIS — G35 Multiple sclerosis: Secondary | ICD-10-CM | POA: Diagnosis not present

## 2020-03-11 DIAGNOSIS — I10 Essential (primary) hypertension: Secondary | ICD-10-CM | POA: Diagnosis not present

## 2020-03-11 NOTE — Telephone Encounter (Signed)
Faith [name] with [facility] and gave him the verbal OK per Dr. Anastasio Champion for the OT orders. Aaron Edelman verbalized an understanding.

## 2020-03-11 NOTE — Telephone Encounter (Signed)
A man from [facility] (phone cut out and could not hear all of his message) called and left a detailed voice message that patient needs OT 2 times per week for 3 weeks then 1 time a week for 1 week to work on strength, balance, and endurance to help patient to be safe and independent. [phone]  Please advise if OK for OT.

## 2020-03-11 NOTE — Telephone Encounter (Signed)
Yes, I am okay with occupational therapy orders.  Please give verbal orders.  Thanks.

## 2020-03-12 ENCOUNTER — Encounter (HOSPITAL_COMMUNITY): Payer: Self-pay | Admitting: Emergency Medicine

## 2020-03-12 ENCOUNTER — Other Ambulatory Visit: Payer: Self-pay

## 2020-03-12 ENCOUNTER — Encounter (HOSPITAL_COMMUNITY): Payer: Self-pay

## 2020-03-12 ENCOUNTER — Emergency Department (HOSPITAL_COMMUNITY)
Admission: EM | Admit: 2020-03-12 | Discharge: 2020-03-12 | Disposition: A | Discharge status: Home / Self Care | Payer: Medicaid Other | Source: Home / Self Care | Attending: Emergency Medicine | Admitting: Emergency Medicine

## 2020-03-12 ENCOUNTER — Emergency Department (HOSPITAL_COMMUNITY): Payer: Medicaid Other

## 2020-03-12 ENCOUNTER — Ambulatory Visit (HOSPITAL_COMMUNITY): Payer: Medicaid Other

## 2020-03-12 DIAGNOSIS — W01198A Fall on same level from slipping, tripping and stumbling with subsequent striking against other object, initial encounter: Secondary | ICD-10-CM | POA: Insufficient documentation

## 2020-03-12 DIAGNOSIS — Z79899 Other long term (current) drug therapy: Secondary | ICD-10-CM | POA: Insufficient documentation

## 2020-03-12 DIAGNOSIS — F17200 Nicotine dependence, unspecified, uncomplicated: Secondary | ICD-10-CM | POA: Insufficient documentation

## 2020-03-12 DIAGNOSIS — Y92002 Bathroom of unspecified non-institutional (private) residence single-family (private) house as the place of occurrence of the external cause: Secondary | ICD-10-CM | POA: Insufficient documentation

## 2020-03-12 DIAGNOSIS — Y9389 Activity, other specified: Secondary | ICD-10-CM | POA: Insufficient documentation

## 2020-03-12 DIAGNOSIS — M25511 Pain in right shoulder: Secondary | ICD-10-CM

## 2020-03-12 DIAGNOSIS — R52 Pain, unspecified: Secondary | ICD-10-CM | POA: Diagnosis not present

## 2020-03-12 DIAGNOSIS — M19011 Primary osteoarthritis, right shoulder: Secondary | ICD-10-CM | POA: Diagnosis not present

## 2020-03-12 DIAGNOSIS — J449 Chronic obstructive pulmonary disease, unspecified: Secondary | ICD-10-CM | POA: Insufficient documentation

## 2020-03-12 DIAGNOSIS — I10 Essential (primary) hypertension: Secondary | ICD-10-CM | POA: Insufficient documentation

## 2020-03-12 NOTE — ED Notes (Signed)
Pt assisted with bed pan

## 2020-03-12 NOTE — ED Provider Notes (Signed)
[facility] Provider Note   CSN: [id number] Arrival date & time: 03/12/20  0840     History Chief Complaint  Patient presents with  . Fall    Faith [name] is a 65 y.o. female.  Patient complaining of right shoulder pain.  She states that she is typically in her wheelchair due to history of severe lower back pain.  Today she was transferring to the toilet her feet got caught and she fell and hit her right shoulder on the ground. Headache or other pains other than her right shoulder.        Past Medical History:  Diagnosis Date  . Anemia    my whole life, Used to take iron  . Arthritis    spine, lumbar  . COPD (chronic obstructive pulmonary disease) ([place])   . Emphysema of lung ([place])   . HTN (hypertension) 03/01/2020  . Hyperlipidemia 11/28/2019  . Narcolepsy    by history, has nightmares  . Neuromuscular disorder ([place])    leg issues from spine    Patient Active Problem List   Diagnosis Date Noted  . Bilateral leg weakness 03/01/2020  . COPD (chronic obstructive pulmonary disease) ([place]) 03/01/2020  . HTN (hypertension) 03/01/2020  . Pressure injury of right buttock, stage 2 ([place]) 02/08/2020  . PVD (peripheral vascular disease) ([place]) 11/28/2019  . Hyperlipidemia 11/28/2019  . Wheelchair dependence 09/24/2019  . Cervical myelopathy ([place]) 01/22/2019  . Spinal cord compression due to degenerative disorder of spinal column 01/20/2019  . Synovial cyst   . Spondylolisthesis, cervical region 01/19/2019  . Chronic back pain 01/18/2019  . Multiple sclerosis exacerbation ([place]) 01/17/2019  . Erythrocytosis 09/13/2018  . Vitamin D deficiency 03/09/2016  . Multiple sclerosis ([place]) 02/24/2016  . Tobacco use 12/11/2015  . Aortic atherosclerosis ([place]) 12/11/2015  . 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 Falls frequently 12/11/2015  . 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 Other emphysema ([place]) 12/11/2015    Past  Surgical History:  Procedure Laterality Date  . ANTERIOR CERVICAL DECOMP/DISCECTOMY FUSION N/A 01/19/2019   Procedure: ANTERIOR CERVICAL DECOMPRESSION/DISCECTOMY Cervical three - four;  Surgeon: Ashok Pall, MD;  Location: [facility];  Service: Neurosurgery;  Laterality: N/A;  . EYE SURGERY Right   . ORIF FACIAL FRACTURE    . POSTERIOR CERVICAL LAMINECTOMY N/A 01/19/2019   Procedure: POSTERIOR CERVICAL LAMINECTOMY Removal of Synovial Cyst and posterior cervical fusion;  Surgeon: Ashok Pall, MD;  Location: [facility];  Service: Neurosurgery;  Laterality: N/A;  . TUBAL LIGATION    . tubaligation       OB History    Gravida  3   Para  3   Term  3   Preterm      AB      Living        SAB      IAB      Ectopic      Multiple      Live Births              Family History  Problem Relation Age of Onset  . Heart attack Mother   . Kidney failure Mother   . Diabetes Mother   . Heart disease Mother   . Hyperlipidemia Mother   . Hypertension Mother   . Heart attack Father 3  . Hypertension Father   . Hypertension Sister   . Diabetes Sister   . Other Brother  house fire  . Hypertension Brother   . Seizures Brother   . Heart disease Brother        mitral valve  . Arthritis Daughter        DJD, AS fibromyalgia  . Polymyositis Daughter   . Cancer Maternal Grandfather        lung  . Heart disease Maternal Grandfather   . Other Paternal Grandmother        house fire  . Alcohol abuse Son     Social History   Tobacco Use  . Smoking status: Current Every Day Smoker    Packs/day: 0.25    Years: 45.00    Pack years: 11.25  . Smokeless tobacco: Never Used  . Tobacco comment: cutting down  Vaping Use  . Vaping Use: Never used  Substance Use Topics  . Alcohol use: Yes    Alcohol/week: 2.0 standard drinks    Types: 2 Cans of beer per week  . Drug use: No    Home Medications Prior to Admission medications   Medication Sig Start Date End Date Taking?  Authorizing Provider  atorvastatin (LIPITOR) 20 MG tablet Take 1 tablet (20 mg total) by mouth daily. 01/30/20   Ailene Ards, NP  baclofen (LIORESAL) 10 MG tablet Take 1 tablet (10 mg total) by mouth 3 (three) times daily. 09/24/19   Lovorn, Jinny Blossom, MD  celecoxib (CELEBREX) 200 MG capsule TAKE (1) CAPSULE BY MOUTH EVERY 12 HOURS. Patient taking differently: Take 200 mg by mouth 2 (two) times daily. 02/27/20   Doree Albee, MD  Cholecalciferol 50 MCG (2000 UT) TABS Take 2,000 Int'l Units by mouth daily.    [provider]  diazepam (VALIUM) 5 MG tablet TAKE (1) TABLET BY MOUTH EVERY SIX HOURS AS NEEDED FOR MUSCLE SPASMS. At least 3x/day- need to take it. For spasms 02/08/20   Lovorn, Jinny Blossom, MD  gabapentin (NEURONTIN) 300 MG capsule Take 2 capsules (600 mg total) by mouth 3 (three) times daily. 02/05/19   Angiulli, Lavon Paganini, PA-C  ibuprofen (ADVIL) 800 MG tablet TAKE (1) TABLET BY MOUTH DAILY AS NEEDED. Patient taking differently: Take 800 mg by mouth daily as needed. 05/29/19   Doree Albee, MD  propranolol (INDERAL) 40 MG tablet Take 40 mg by mouth 2 (two) times daily. 12/18/19   [provider]  traMADol (ULTRAM) 50 MG tablet Take 2 tablets (100 mg total) by mouth every 6 (six) hours as needed for moderate pain. 12/04/19   Doree Albee, MD    Allergies    Darvon [propoxyphene], Oxycodone-acetaminophen, and Penicillins  Review of Systems   Review of Systems  Constitutional: Negative for fever.  HENT: Negative for ear pain.   Eyes: Negative for pain.  Respiratory: Negative for cough.   Cardiovascular: Negative for chest pain.  Gastrointestinal: Negative for abdominal pain.  Genitourinary: Negative for flank pain.  Musculoskeletal: Negative for back pain.  Skin: Negative for rash.  Neurological: Negative for headaches.    Physical Exam Updated Vital Signs Ht 5\' 4"  (1.626 m)   Wt 45.8 kg   BMI 17.33 kg/m   Physical Exam Constitutional:      General:  She is not in acute distress.    Appearance: Normal appearance.  HENT:     Head: Normocephalic and atraumatic.     Nose: Nose normal.  Eyes:     Extraocular Movements: Extraocular movements intact.  Cardiovascular:     Rate and Rhythm: Normal rate.  Pulmonary:  Effort: Pulmonary effort is normal.  Musculoskeletal:     Cervical back: Normal range of motion.     Comments: Pain with tender range of motion of the right shoulder. On visual section it appears in place however. No laceration or bruising noted.  No CT or L-spine midline tenderness or step-offs noted.  No lower extremity pain with range of motion noted.  Neurological:     General: No focal deficit present.     Mental Status: She is alert. Mental status is at baseline.     ED Results / Procedures / Treatments   Labs (all labs ordered are listed, but only abnormal results are displayed) Labs Reviewed - No data to display  EKG EKG Interpretation  Date/Time:  Wednesday March 12 2020 09:05:27 EST Ventricular Rate:  108 PR Interval:    QRS Duration: 112 QT Interval:  328 QTC Calculation: 440 R Axis:   65 Text Interpretation: Right and left arm electrode reversal, interpretation assumes no reversal Sinus tachycardia Biatrial enlargement Incomplete right bundle branch block Borderline ST depression, lateral leads Artifact in lead(s) I II III aVR aVL aVF V1 V2 V3 V4 V5 V6 Confirmed by Thamas Jaegers ([id number]) on 03/12/2020 10:26:59 AM   Radiology DG Shoulder Right  Result Date: 03/12/2020 CLINICAL DATA:  Pain following fall EXAM: RIGHT SHOULDER - 2+ VIEW COMPARISON:  None. FINDINGS: Frontal and Y scapular images were obtained. No fracture or dislocation. There is moderate generalized joint space narrowing. No erosive change or intra-articular calcification. Visualized right lung clear. IMPRESSION: Moderate generalized osteoarthritic change. No fracture or dislocation. Electronically Signed   By: Lowella Grip III M.D.    On: 03/12/2020 09:58    Procedures Procedures (including critical care time)  Medications Ordered in ED Medications - No data to display  ED Course  I have reviewed the triage vital signs and the nursing notes.  Pertinent labs & imaging results that were available during my care of the patient were reviewed by me and considered in my medical decision making (see chart for details).    MDM Rules/Calculators/A&P                          X-ray of the right shoulder unremarkable for any acute fracture or dislocation. Patient states she took tramadol just before arriving to the ER and declined additional pain medications here. Initially she was somnolent appearing but easily arousable, likely due to the tramadol she took. However at this point she is awake and alert and at her baseline mental status.  Advising outpatient follow-up with her doctors in 2 or 3 days. Advised immediate return for worsening pain fevers or any additional concerns. Final Clinical Impression(s) / ED Diagnoses Final diagnoses:  Acute pain of right shoulder    Rx / DC Orders ED Discharge Orders    None       Luna Fuse, MD 03/12/20 1029

## 2020-03-12 NOTE — Discharge Instructions (Signed)
Call your primary care doctor or specialist as discussed in the next 2-3 days.   Return immediately back to the ER if:  Your symptoms worsen within the next 12-24 hours. You develop new symptoms such as new fevers, persistent vomiting, new pain, shortness of breath, or new weakness or numbness, or if you have any other concerns.  

## 2020-03-12 NOTE — ED Triage Notes (Addendum)
Pt from home via [facility]. Pt reports fall last night after tripping on her feet. Pt reports right shoulder pain at this time. Pt lethargic at this time but does respond to voice.

## 2020-03-12 NOTE — ED Notes (Signed)
Awaiting ride home

## 2020-03-13 ENCOUNTER — Inpatient Hospital Stay (HOSPITAL_COMMUNITY)
Admission: EM | Admit: 2020-03-13 | Discharge: 2020-03-31 | DRG: 872 | Disposition: A | Discharge status: Home Health | Payer: Medicaid Other | Attending: Internal Medicine | Admitting: Internal Medicine

## 2020-03-13 ENCOUNTER — Emergency Department (HOSPITAL_COMMUNITY): Payer: Medicaid Other

## 2020-03-13 ENCOUNTER — Encounter (HOSPITAL_COMMUNITY): Payer: Self-pay | Admitting: Internal Medicine

## 2020-03-13 ENCOUNTER — Other Ambulatory Visit: Payer: Self-pay

## 2020-03-13 ENCOUNTER — Inpatient Hospital Stay (HOSPITAL_COMMUNITY): Payer: Medicaid Other

## 2020-03-13 DIAGNOSIS — M25519 Pain in unspecified shoulder: Secondary | ICD-10-CM | POA: Diagnosis not present

## 2020-03-13 DIAGNOSIS — A4102 Sepsis due to Methicillin resistant Staphylococcus aureus: Secondary | ICD-10-CM | POA: Diagnosis present

## 2020-03-13 DIAGNOSIS — Z993 Dependence on wheelchair: Secondary | ICD-10-CM | POA: Diagnosis not present

## 2020-03-13 DIAGNOSIS — M47814 Spondylosis without myelopathy or radiculopathy, thoracic region: Secondary | ICD-10-CM | POA: Diagnosis not present

## 2020-03-13 DIAGNOSIS — E440 Moderate protein-calorie malnutrition: Secondary | ICD-10-CM | POA: Diagnosis not present

## 2020-03-13 DIAGNOSIS — L89152 Pressure ulcer of sacral region, stage 2: Secondary | ICD-10-CM

## 2020-03-13 DIAGNOSIS — D72829 Elevated white blood cell count, unspecified: Secondary | ICD-10-CM

## 2020-03-13 DIAGNOSIS — I10 Essential (primary) hypertension: Secondary | ICD-10-CM | POA: Diagnosis present

## 2020-03-13 DIAGNOSIS — N390 Urinary tract infection, site not specified: Secondary | ICD-10-CM

## 2020-03-13 DIAGNOSIS — E876 Hypokalemia: Secondary | ICD-10-CM | POA: Diagnosis present

## 2020-03-13 DIAGNOSIS — L8990 Pressure ulcer of unspecified site, unspecified stage: Secondary | ICD-10-CM | POA: Diagnosis present

## 2020-03-13 DIAGNOSIS — R7881 Bacteremia: Secondary | ICD-10-CM

## 2020-03-13 DIAGNOSIS — Z8249 Family history of ischemic heart disease and other diseases of the circulatory system: Secondary | ICD-10-CM | POA: Diagnosis not present

## 2020-03-13 DIAGNOSIS — R6510 Systemic inflammatory response syndrome (SIRS) of non-infectious origin without acute organ dysfunction: Secondary | ICD-10-CM | POA: Diagnosis not present

## 2020-03-13 DIAGNOSIS — J439 Emphysema, unspecified: Secondary | ICD-10-CM | POA: Diagnosis present

## 2020-03-13 DIAGNOSIS — Q211 Atrial septal defect: Secondary | ICD-10-CM | POA: Diagnosis not present

## 2020-03-13 DIAGNOSIS — R296 Repeated falls: Secondary | ICD-10-CM

## 2020-03-13 DIAGNOSIS — A419 Sepsis, unspecified organism: Secondary | ICD-10-CM | POA: Diagnosis not present

## 2020-03-13 DIAGNOSIS — R531 Weakness: Secondary | ICD-10-CM | POA: Diagnosis not present

## 2020-03-13 DIAGNOSIS — F17210 Nicotine dependence, cigarettes, uncomplicated: Secondary | ICD-10-CM | POA: Diagnosis present

## 2020-03-13 DIAGNOSIS — Z791 Long term (current) use of non-steroidal anti-inflammatories (NSAID): Secondary | ICD-10-CM

## 2020-03-13 DIAGNOSIS — R000 Tachycardia, unspecified: Secondary | ICD-10-CM | POA: Diagnosis not present

## 2020-03-13 DIAGNOSIS — Z885 Allergy status to narcotic agent status: Secondary | ICD-10-CM | POA: Diagnosis not present

## 2020-03-13 DIAGNOSIS — L89154 Pressure ulcer of sacral region, stage 4: Secondary | ICD-10-CM

## 2020-03-13 DIAGNOSIS — G35 Multiple sclerosis: Secondary | ICD-10-CM | POA: Diagnosis present

## 2020-03-13 DIAGNOSIS — I071 Rheumatic tricuspid insufficiency: Secondary | ICD-10-CM | POA: Diagnosis present

## 2020-03-13 DIAGNOSIS — I340 Nonrheumatic mitral (valve) insufficiency: Secondary | ICD-10-CM | POA: Diagnosis not present

## 2020-03-13 DIAGNOSIS — R52 Pain, unspecified: Secondary | ICD-10-CM | POA: Diagnosis not present

## 2020-03-13 DIAGNOSIS — I700 Atherosclerosis of aorta: Secondary | ICD-10-CM | POA: Diagnosis not present

## 2020-03-13 DIAGNOSIS — W19XXXA Unspecified fall, initial encounter: Secondary | ICD-10-CM | POA: Diagnosis present

## 2020-03-13 DIAGNOSIS — K6389 Other specified diseases of intestine: Secondary | ICD-10-CM | POA: Diagnosis not present

## 2020-03-13 DIAGNOSIS — Z681 Body mass index (BMI) 19 or less, adult: Secondary | ICD-10-CM | POA: Diagnosis not present

## 2020-03-13 DIAGNOSIS — E785 Hyperlipidemia, unspecified: Secondary | ICD-10-CM | POA: Diagnosis present

## 2020-03-13 DIAGNOSIS — M19011 Primary osteoarthritis, right shoulder: Secondary | ICD-10-CM | POA: Diagnosis not present

## 2020-03-13 DIAGNOSIS — L89159 Pressure ulcer of sacral region, unspecified stage: Secondary | ICD-10-CM | POA: Diagnosis not present

## 2020-03-13 DIAGNOSIS — Z20822 Contact with and (suspected) exposure to covid-19: Secondary | ICD-10-CM | POA: Diagnosis present

## 2020-03-13 DIAGNOSIS — L89310 Pressure ulcer of right buttock, unstageable: Secondary | ICD-10-CM | POA: Diagnosis not present

## 2020-03-13 DIAGNOSIS — K3589 Other acute appendicitis without perforation or gangrene: Secondary | ICD-10-CM | POA: Diagnosis not present

## 2020-03-13 DIAGNOSIS — J449 Chronic obstructive pulmonary disease, unspecified: Secondary | ICD-10-CM | POA: Diagnosis not present

## 2020-03-13 DIAGNOSIS — G47419 Narcolepsy without cataplexy: Secondary | ICD-10-CM | POA: Diagnosis present

## 2020-03-13 DIAGNOSIS — Z880 Allergy status to penicillin: Secondary | ICD-10-CM | POA: Diagnosis not present

## 2020-03-13 DIAGNOSIS — Z79899 Other long term (current) drug therapy: Secondary | ICD-10-CM

## 2020-03-13 DIAGNOSIS — M47816 Spondylosis without myelopathy or radiculopathy, lumbar region: Secondary | ICD-10-CM | POA: Diagnosis not present

## 2020-03-13 DIAGNOSIS — I361 Nonrheumatic tricuspid (valve) insufficiency: Secondary | ICD-10-CM | POA: Diagnosis not present

## 2020-03-13 DIAGNOSIS — R29898 Other symptoms and signs involving the musculoskeletal system: Secondary | ICD-10-CM | POA: Diagnosis not present

## 2020-03-13 DIAGNOSIS — L89311 Pressure ulcer of right buttock, stage 1: Secondary | ICD-10-CM | POA: Diagnosis not present

## 2020-03-13 DIAGNOSIS — K3580 Unspecified acute appendicitis: Secondary | ICD-10-CM | POA: Diagnosis not present

## 2020-03-13 DIAGNOSIS — M25511 Pain in right shoulder: Secondary | ICD-10-CM | POA: Diagnosis not present

## 2020-03-13 LAB — COMPREHENSIVE METABOLIC PANEL
ALT: 21 U/L (ref 0–44)
AST: 19 U/L (ref 15–41)
Albumin: 3 g/dL — ABNORMAL LOW (ref 3.5–5.0)
Alkaline Phosphatase: 78 U/L (ref 38–126)
Anion gap: 11 (ref 5–15)
BUN: 11 mg/dL (ref 8–23)
CO2: 24 mmol/L (ref 22–32)
Calcium: 8.4 mg/dL — ABNORMAL LOW (ref 8.9–10.3)
Chloride: 98 mmol/L (ref 98–111)
Creatinine, Ser: 0.64 mg/dL (ref 0.44–1.00)
GFR, Estimated: >60 mL/min (ref >60)
Glucose, Bld: 141 mg/dL — ABNORMAL HIGH (ref 70–99)
Potassium: 3.2 mmol/L — ABNORMAL LOW (ref 3.5–5.1)
Sodium: 133 mmol/L — ABNORMAL LOW (ref 135–145)
Total Bilirubin: 1.5 mg/dL — ABNORMAL HIGH (ref 0.3–1.2)
Total Protein: 6.2 g/dL — ABNORMAL LOW (ref 6.5–8.1)

## 2020-03-13 LAB — CBC WITH DIFFERENTIAL/PLATELET
Abs Immature Granulocytes: 0.56 10*3/uL — ABNORMAL HIGH (ref 0.00–0.07)
Basophils Absolute: 0.1 10*3/uL (ref 0.0–0.1)
Basophils Relative: 0 %
Eosinophils Absolute: 0 10*3/uL (ref 0.0–0.5)
Eosinophils Relative: 0 %
HCT: 38.7 % (ref 36.0–46.0)
Hemoglobin: 13.5 g/dL (ref 12.0–15.0)
Immature Granulocytes: 2 %
Lymphocytes Relative: 3 %
Lymphs Abs: 1 10*3/uL (ref 0.7–4.0)
MCH: 33.8 pg (ref 26.0–34.0)
MCHC: 34.9 g/dL (ref 30.0–36.0)
MCV: 96.8 fL (ref 80.0–100.0)
Monocytes Absolute: 1.8 10*3/uL — ABNORMAL HIGH (ref 0.1–1.0)
Monocytes Relative: 6 %
Neutro Abs: 29.3 10*3/uL — ABNORMAL HIGH (ref 1.7–7.7)
Neutrophils Relative %: 89 %
Platelets: 283 10*3/uL (ref 150–400)
RBC: 4 MIL/uL (ref 3.87–5.11)
RDW: 13 % (ref 11.5–15.5)
WBC: 32.8 10*3/uL — ABNORMAL HIGH (ref 4.0–10.5)
nRBC: 0 % (ref 0.0–0.2)

## 2020-03-13 LAB — URINALYSIS, ROUTINE W REFLEX MICROSCOPIC
Bilirubin Urine: NEGATIVE
Glucose, UA: NEGATIVE mg/dL
Hgb urine dipstick: NEGATIVE
Ketones, ur: 20 mg/dL — AB
Leukocytes,Ua: NEGATIVE
Nitrite: NEGATIVE
Protein, ur: 100 mg/dL — AB
Specific Gravity, Urine: 1.017 (ref 1.005–1.030)
pH: 6 (ref 5.0–8.0)

## 2020-03-13 LAB — PROTIME-INR
INR: 1.1 (ref 0.8–1.2)
Prothrombin Time: 14.2 seconds (ref 11.4–15.2)

## 2020-03-13 LAB — LACTIC ACID, PLASMA
Lactic Acid, Venous: 1.6 mmol/L (ref 0.5–1.9)
Lactic Acid, Venous: 1.5 mmol/L (ref 0.5–1.9)

## 2020-03-13 LAB — APTT: aPTT: 29 seconds (ref 24–36)

## 2020-03-13 LAB — SARS CORONAVIRUS 2 (TAT 6-24 HRS): SARS Coronavirus 2: NEGATIVE

## 2020-03-13 LAB — RESP PANEL BY RT-PCR (FLU A&B, COVID) ARPGX2
Influenza A by PCR: NEGATIVE
Influenza B by PCR: NEGATIVE
SARS Coronavirus 2 by RT PCR: NEGATIVE

## 2020-03-13 MED ORDER — ONDANSETRON HCL 4 MG PO TABS: 4.0000 mg | ORAL_TABLET | Freq: Four times a day (QID) | ORAL | Status: DC | PRN

## 2020-03-13 MED ORDER — POTASSIUM CHLORIDE CRYS ER 20 MEQ PO TBCR
40.0000 meq | EXTENDED_RELEASE_TABLET | Freq: Once | ORAL | Status: AC
  Administered 2020-03-13: 40 meq via ORAL
  Filled 2020-03-13: qty 2

## 2020-03-13 MED ORDER — SODIUM CHLORIDE 0.9 % IV SOLN
1.0000 g | INTRAVENOUS | Status: DC
  Administered 2020-03-13: 1 g via INTRAVENOUS
  Filled 2020-03-13: qty 10

## 2020-03-13 MED ORDER — POTASSIUM CHLORIDE CRYS ER 20 MEQ PO TBCR: 40.0000 meq | EXTENDED_RELEASE_TABLET | Freq: Two times a day (BID) | ORAL | Status: DC

## 2020-03-13 MED ORDER — DIAZEPAM 5 MG PO TABS
5.0000 mg | ORAL_TABLET | Freq: Four times a day (QID) | ORAL | Status: DC | PRN
  Administered 2020-03-14 – 2020-03-31 (×21): 5 mg via ORAL
  Filled 2020-03-13 (×22): qty 1

## 2020-03-13 MED ORDER — METRONIDAZOLE IN NACL 5-0.79 MG/ML-% IV SOLN
500.0000 mg | Freq: Once | INTRAVENOUS | Status: AC
  Administered 2020-03-13: 500 mg via INTRAVENOUS
  Filled 2020-03-13: qty 100

## 2020-03-13 MED ORDER — ONDANSETRON HCL 4 MG/2ML IJ SOLN: 4.0000 mg | Freq: Four times a day (QID) | INTRAMUSCULAR | Status: DC | PRN

## 2020-03-13 MED ORDER — ATORVASTATIN CALCIUM 20 MG PO TABS
20.0000 mg | ORAL_TABLET | Freq: Every day | ORAL | Status: DC
Start: 2020-03-13 — End: 2020-03-31
  Administered 2020-03-13 – 2020-03-31 (×19): 20 mg via ORAL
  Filled 2020-03-13 (×19): qty 1

## 2020-03-13 MED ORDER — VANCOMYCIN HCL 750 MG/150ML IV SOLN
750.0000 mg | INTRAVENOUS | Status: DC
  Filled 2020-03-13: qty 150

## 2020-03-13 MED ORDER — SODIUM CHLORIDE 0.9 % IV BOLUS (SEPSIS)
1000.0000 mL | Freq: Once | INTRAVENOUS | Status: AC
  Administered 2020-03-13: 1000 mL via INTRAVENOUS

## 2020-03-13 MED ORDER — ACETAMINOPHEN 650 MG RE SUPP: 650.0000 mg | Freq: Four times a day (QID) | RECTAL | Status: DC | PRN

## 2020-03-13 MED ORDER — GABAPENTIN 300 MG PO CAPS
600.0000 mg | ORAL_CAPSULE | Freq: Three times a day (TID) | ORAL | Status: DC
  Administered 2020-03-13 – 2020-03-31 (×54): 600 mg via ORAL
  Filled 2020-03-13 (×54): qty 2

## 2020-03-13 MED ORDER — ACETAMINOPHEN 325 MG PO TABS: 650.0000 mg | ORAL_TABLET | Freq: Four times a day (QID) | ORAL | Status: DC | PRN

## 2020-03-13 MED ORDER — SODIUM CHLORIDE 0.9 % IV SOLN: INTRAVENOUS | Status: AC

## 2020-03-13 MED ORDER — POLYETHYLENE GLYCOL 3350 17 G PO PACK
17.0000 g | PACK | Freq: Every day | ORAL | Status: DC | PRN
  Administered 2020-03-15: 17 g via ORAL
  Filled 2020-03-13 (×2): qty 1

## 2020-03-13 MED ORDER — SODIUM CHLORIDE 0.9 % IV SOLN: 2.0000 g | Freq: Once | INTRAVENOUS | Status: DC

## 2020-03-13 MED ORDER — SODIUM CHLORIDE 0.9 % IV SOLN
2.0000 g | Freq: Once | INTRAVENOUS | Status: AC
  Administered 2020-03-13: 2 g via INTRAVENOUS
  Filled 2020-03-13: qty 2

## 2020-03-13 MED ORDER — BACLOFEN 10 MG PO TABS
10.0000 mg | ORAL_TABLET | Freq: Three times a day (TID) | ORAL | Status: DC
  Administered 2020-03-13 – 2020-03-31 (×54): 10 mg via ORAL
  Filled 2020-03-13 (×54): qty 1

## 2020-03-13 MED ORDER — VANCOMYCIN HCL IN DEXTROSE 1-5 GM/200ML-% IV SOLN
1000.0000 mg | Freq: Once | INTRAVENOUS | Status: AC
  Administered 2020-03-13: 1000 mg via INTRAVENOUS
  Filled 2020-03-13: qty 200

## 2020-03-13 MED ORDER — LEVOFLOXACIN IN D5W 750 MG/150ML IV SOLN: 750.0000 mg | Freq: Once | INTRAVENOUS | Status: DC

## 2020-03-13 MED ORDER — MORPHINE SULFATE (PF) 2 MG/ML IV SOLN: 2.0000 mg | INTRAVENOUS | Status: DC | PRN

## 2020-03-13 MED ORDER — SODIUM CHLORIDE 0.9 % IV SOLN
1000.0000 mL | INTRAVENOUS | Status: DC
  Administered 2020-03-13: 1000 mL via INTRAVENOUS

## 2020-03-13 MED ORDER — IOHEXOL 300 MG/ML  SOLN
75.0000 mL | Freq: Once | INTRAMUSCULAR | Status: AC | PRN
  Administered 2020-03-13: 75 mL via INTRAVENOUS

## 2020-03-13 NOTE — ED Notes (Signed)
Lab to add urine culture to existing urine

## 2020-03-13 NOTE — ED Provider Notes (Signed)
[facility] EMERGENCY DEPARTMENT Provider Note   CSN: [id number] Arrival date & time: 03/13/20  [id number]     History Chief Complaint  Patient presents with  . Urinary Tract Infection    Faith [name] is a 65 y.o. female.  Pt reports she has burning with urination.  Pt reports she also has a sore on her bottom.  Pt fell yesterday .  Pt complains of increased weakness today  The history is provided by the patient. No language interpreter was used.  Urinary Tract Infection Pain quality:  Aching Pain severity:  Moderate Onset quality:  Gradual Duration:  2 days Timing:  Constant Progression:  Worsening Chronicity:  New Relieved by:  Nothing Worsened by:  Nothing Ineffective treatments:  None tried      Past Medical History:  Diagnosis Date  . Anemia    my whole life, Used to take iron  . Arthritis    spine, lumbar  . COPD (chronic obstructive pulmonary disease) ([place])   . Emphysema of lung ([place])   . HTN (hypertension) 03/01/2020  . Hyperlipidemia 11/28/2019  . Narcolepsy    by history, has nightmares  . Neuromuscular disorder ([place])    leg issues from spine    Patient Active Problem List   Diagnosis Date Noted  . Bilateral leg weakness 03/01/2020  . COPD (chronic obstructive pulmonary disease) ([place]) 03/01/2020  . HTN (hypertension) 03/01/2020  . Pressure injury of right buttock, stage 2 ([place]) 02/08/2020  . PVD (peripheral vascular disease) ([place]) 11/28/2019  . Hyperlipidemia 11/28/2019  . Wheelchair dependence 09/24/2019  . Cervical myelopathy ([place]) 01/22/2019  . Spinal cord compression due to degenerative disorder of spinal column 01/20/2019  . Synovial cyst   . Spondylolisthesis, cervical region 01/19/2019  . Chronic back pain 01/18/2019  . Multiple sclerosis exacerbation ([place]) 01/17/2019  . Erythrocytosis 09/13/2018  . Vitamin D deficiency 03/09/2016  . Multiple sclerosis ([place]) 02/24/2016  . Tobacco use 12/11/2015  . Aortic atherosclerosis ([place]) 12/11/2015  .  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 Falls frequently 12/11/2015  . 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 Other emphysema ([place]) 12/11/2015    Past Surgical History:  Procedure Laterality Date  . ANTERIOR CERVICAL DECOMP/DISCECTOMY FUSION N/A 01/19/2019   Procedure: ANTERIOR CERVICAL DECOMPRESSION/DISCECTOMY Cervical three - four;  Surgeon: Ashok Pall, MD;  Location: [place];  Service: Neurosurgery;  Laterality: N/A;  . EYE SURGERY Right   . ORIF FACIAL FRACTURE    . POSTERIOR CERVICAL LAMINECTOMY N/A 01/19/2019   Procedure: POSTERIOR CERVICAL LAMINECTOMY Removal of Synovial Cyst and posterior cervical fusion;  Surgeon: Ashok Pall, MD;  Location: [place];  Service: Neurosurgery;  Laterality: N/A;  . TUBAL LIGATION    . tubaligation       OB History    Gravida  3   Para  3   Term  3   Preterm      AB      Living        SAB      IAB      Ectopic      Multiple      Live Births              Family History  Problem Relation Age of Onset  . Heart attack Mother   . Kidney failure Mother   . Diabetes Mother   . Heart disease Mother   . Hyperlipidemia Mother   . Hypertension Mother   . Heart  attack Father 32  . Hypertension Father   . Hypertension Sister   . Diabetes Sister   . Other Brother        house fire  . Hypertension Brother   . Seizures Brother   . Heart disease Brother        mitral valve  . Arthritis Daughter        DJD, AS fibromyalgia  . Polymyositis Daughter   . Cancer Maternal Grandfather        lung  . Heart disease Maternal Grandfather   . Other Paternal Grandmother        house fire  . Alcohol abuse Son     Social History   Tobacco Use  . Smoking status: Current Every Day Smoker    Packs/day: 0.25    Years: 45.00    Pack years: 11.25  . Smokeless tobacco: Never Used  . Tobacco comment: cutting down  Vaping Use  . Vaping Use: Never used  Substance Use Topics  . Alcohol use:  Yes    Alcohol/week: 2.0 standard drinks    Types: 2 Cans of beer per week  . Drug use: No    Home Medications Prior to Admission medications   Medication Sig Start Date End Date Taking? Authorizing Provider  atorvastatin (LIPITOR) 20 MG tablet Take 1 tablet (20 mg total) by mouth daily. 01/30/20  Yes Ailene Ards, NP  baclofen (LIORESAL) 10 MG tablet Take 1 tablet (10 mg total) by mouth 3 (three) times daily. 09/24/19  Yes Lovorn, Jinny Blossom, MD  celecoxib (CELEBREX) 200 MG capsule TAKE (1) CAPSULE BY MOUTH EVERY 12 HOURS. Patient taking differently: Take 200 mg by mouth 2 (two) times daily. 02/27/20  Yes Gosrani, Nimish C, MD  Cholecalciferol 50 MCG (2000 UT) TABS Take 2,000 Int'l Units by mouth daily.   Yes [provider]  diazepam (VALIUM) 5 MG tablet TAKE (1) TABLET BY MOUTH EVERY SIX HOURS AS NEEDED FOR MUSCLE SPASMS. At least 3x/day- need to take it. For spasms 02/08/20  Yes Lovorn, Jinny Blossom, MD  gabapentin (NEURONTIN) 300 MG capsule Take 2 capsules (600 mg total) by mouth 3 (three) times daily. 02/05/19  Yes Angiulli, Lavon Paganini, PA-C  ibuprofen (ADVIL) 800 MG tablet TAKE (1) TABLET BY MOUTH DAILY AS NEEDED. Patient taking differently: Take 800 mg by mouth daily as needed. 05/29/19  Yes Gosrani, Nimish C, MD  propranolol (INDERAL) 40 MG tablet Take 40 mg by mouth 2 (two) times daily. 12/18/19  Yes [provider]  traMADol (ULTRAM) 50 MG tablet Take 2 tablets (100 mg total) by mouth every 6 (six) hours as needed for moderate pain. 12/04/19  Yes Doree Albee, MD    Allergies    Darvon [propoxyphene], Oxycodone-acetaminophen, and Penicillins  Review of Systems   Review of Systems  All other systems reviewed and are negative.   Physical Exam Updated Vital Signs BP 103/66   Pulse 100   Temp 98 F (36.7 C) (Oral)   Resp (!) 24   Ht 5\' 4"  (1.626 m)   Wt 45.8 kg   SpO2 100%   BMI 17.34 kg/m   Physical Exam Vitals and nursing note reviewed.  Constitutional:       Appearance: She is well-developed and well-nourished.  HENT:     Head: Normocephalic.     Mouth/Throat:     Mouth: Mucous membranes are moist.  Eyes:     Extraocular Movements: EOM normal.  Cardiovascular:     Rate and  Rhythm: Normal rate and regular rhythm.  Pulmonary:     Effort: Pulmonary effort is normal.  Abdominal:     General: Abdomen is flat. There is no distension.  Genitourinary:    Comments: 10x12 cm area of redness with approx 8cm area of black eschar,  Musculoskeletal:        General: Normal range of motion.     Cervical back: Normal range of motion.  Skin:    General: Skin is warm.  Neurological:     General: No focal deficit present.     Mental Status: She is alert and oriented to person, place, and time. Mental status is at baseline.  Psychiatric:        Mood and Affect: Mood and affect and mood normal.     ED Results / Procedures / Treatments   Labs (all labs ordered are listed, but only abnormal results are displayed) Labs Reviewed  CBC WITH DIFFERENTIAL/PLATELET - Abnormal; Notable for the following components:      Result Value   WBC 32.8 (*)    Neutro Abs 29.3 (*)    Monocytes Absolute 1.8 (*)    Abs Immature Granulocytes 0.56 (*)    All other components within normal limits  COMPREHENSIVE METABOLIC PANEL - Abnormal; Notable for the following components:   Sodium 133 (*)    Potassium 3.2 (*)    Glucose, Bld 141 (*)    Calcium 8.4 (*)    Total Protein 6.2 (*)    Albumin 3.0 (*)    Total Bilirubin 1.5 (*)    All other components within normal limits  URINALYSIS, ROUTINE W REFLEX MICROSCOPIC - Abnormal; Notable for the following components:   Color, Urine AMBER (*)    APPearance HAZY (*)    Ketones, ur 20 (*)    Protein, ur 100 (*)    Bacteria, UA RARE (*)    All other components within normal limits  CULTURE, BLOOD (SINGLE)  URINE CULTURE  SARS CORONAVIRUS 2 (TAT 6-24 HRS)  LACTIC ACID, PLASMA  PROTIME-INR  APTT  LACTIC ACID, PLASMA     EKG None  Radiology DG Shoulder Right  Result Date: 03/12/2020 CLINICAL DATA:  Pain following fall EXAM: RIGHT SHOULDER - 2+ VIEW COMPARISON:  None. FINDINGS: Frontal and Y scapular images were obtained. No fracture or dislocation. There is moderate generalized joint space narrowing. No erosive change or intra-articular calcification. Visualized right lung clear. IMPRESSION: Moderate generalized osteoarthritic change. No fracture or dislocation. Electronically Signed   By: Lowella Grip III M.D.   On: 03/12/2020 09:58   DG Chest Port 1 View  Result Date: 03/13/2020 CLINICAL DATA:  Sepsis. EXAM: PORTABLE CHEST 1 VIEW COMPARISON:  January 28, 2009. FINDINGS: The heart size and mediastinal contours are within normal limits. Both lungs are clear. The visualized skeletal structures are unremarkable. IMPRESSION: No active disease. Electronically Signed   By: Marijo Conception M.D.   On: 03/13/2020 11:29    Procedures .Critical Care Performed by: Fransico Meadow, PA-C Authorized by: Fransico Meadow, PA-C   Critical care provider statement:    Critical care time (minutes):  45   Critical care start time:  03/13/2020 10:00 AM   Critical care end time:  03/13/2020 2:00 PM   Critical care time was exclusive of:  Separately billable procedures and treating other patients   Critical care was necessary to treat or prevent imminent or life-threatening deterioration of the following conditions:  Sepsis   Critical care was time  spent personally by me on the following activities:  Discussions with consultants, evaluation of patient's response to treatment, examination of patient, ordering and performing treatments and interventions, ordering and review of laboratory studies, ordering and review of radiographic studies, pulse oximetry, re-evaluation of patient's condition, obtaining history from patient or surrogate and review of old charts   (including critical care time)  Medications Ordered in  ED Medications  vancomycin (VANCOREADY) IVPB 750 mg/150 mL (has no administration in time range)  potassium chloride SA (KLOR-CON) CR tablet 40 mEq (has no administration in time range)  vancomycin (VANCOCIN) IVPB 1000 mg/200 mL premix (1,000 mg Intravenous New Bag/Given 03/13/20 1207)  ceFEPIme (MAXIPIME) 2 g in sodium chloride 0.9 % 100 mL IVPB (2 g Intravenous New Bag/Given 03/13/20 1243)  metroNIDAZOLE (FLAGYL) IVPB 500 mg (500 mg Intravenous New Bag/Given 03/13/20 1211)    ED Course  I have reviewed the triage vital signs and the nursing notes.  Pertinent labs & imaging results that were available during my care of the patient were reviewed by me and considered in my medical decision making (see chart for details).  Clinical Course as of 03/13/20 1554  Thu Mar 13, 2020  1501 ALT: 21 [LS]    Clinical Course User Index [LS] Fransico Meadow, [place]   MDM Rules/Calculators/A&P                          MDM:  Pt has elevated wbc count of 32.  (normal 9 days ago) urine shows wbc's of 11-20.  Evolving septic orders done.  Pt given Iv antibiotics.  Iv fluids ordered  I spoke to Hospitalist who will admit Final Clinical Impression(s) / ED Diagnoses Final diagnoses:  Leukocytosis, unspecified type  Urinary tract infection without hematuria, site unspecified  Weakness    Rx / DC Orders ED Discharge Orders    None       Sidney Ace 03/13/20 1556    Milton Ferguson, MD 03/14/20 [phone]

## 2020-03-13 NOTE — ED Notes (Signed)
Pt CT  

## 2020-03-13 NOTE — ED Notes (Addendum)
Faith [name], daughter, stated she would not be able to be discharged back into their home. Michela Pitcher they would not be picking her up, she can not come back into the home because they are unable to care for her. Stated yesterday she was not suppose to be discharged back home and did.  Pt also noted to be wearing the same clothing as yesterday.

## 2020-03-13 NOTE — ED Triage Notes (Signed)
Arrived via [facility]. Was here yesterday for complaints of fall and right shoulder pain. States she told MD she had UTI symptoms yesterday. Reports urinary frequency, soreness on her sacrum, and continued right shoulder pain. Reports sores on her sacrum were from a previous hospital stay.  EMS reports slight fever of 99 oral.

## 2020-03-13 NOTE — H&P (Addendum)
History and Physical    Faith [name]:[id number] DOB: August 29, 1955 DOA: 03/13/2020  PCP: Doree Albee, MD   Patient coming from: Home  I have personally briefly reviewed patient's old medical records in [place]  Chief Complaint: Falls  HPI: Faith [name] is a 65 y.o. female with medical history significant for multiple sclerosis, COPD, pressure ulcer, hypertension. Patient was brought to the ED via EMS with complaints of fall, and family not being able to take care of her.  My evaluation patient is awake and alert and able to give me detailed history.  Her knees have just been giving way.  She ambulates with a motorized wheelchair and a walker.  She did not hit her head.  Patient was in the ED yesterday for fall and right shoulder pain, x-ray showed moderate osteoarthritic change she was discharged home.  Family are unable to take care of patient. She has had a buttock ulcer for a while.  She reports pain from the area.  She would like to be discharged to rehab facility after this hospitalization.  She denies pain with urination, but she reports some urinary frequency, no chest pain, no cough, no difficulty breathing, no vomiting no loose stools no abdominal pain.  ED Course: Temperature 98.  Heart rate up to 124.  Respiratory rate 16- 33.  Blood pressure systolic 95-621H.  Leukocytosis of 32.  Potassium 3.2.  UA rare bacteria.  Lactic acid 1.9.  Chest x-ray without acute abnormality.  COVID Test negative.  2 L bolus given, Vanco and cefepime started.  Hospitalist to admit for further evaluation and management.  Review of Systems: As per HPI all other systems reviewed and negative.  Past Medical History:  Diagnosis Date  . Anemia    my whole life, Used to take iron  . Arthritis    spine, lumbar  . COPD (chronic obstructive pulmonary disease) ([place])   . Emphysema of lung ([place])   . HTN (hypertension) 03/01/2020  . Hyperlipidemia 11/28/2019  . Narcolepsy    by history, has  nightmares  . Neuromuscular disorder ([place])    leg issues from spine    Past Surgical History:  Procedure Laterality Date  . ANTERIOR CERVICAL DECOMP/DISCECTOMY FUSION N/A 01/19/2019   Procedure: ANTERIOR CERVICAL DECOMPRESSION/DISCECTOMY Cervical three - four;  Surgeon: Ashok Pall, MD;  Location: [facility];  Service: Neurosurgery;  Laterality: N/A;  . EYE SURGERY Right   . ORIF FACIAL FRACTURE    . POSTERIOR CERVICAL LAMINECTOMY N/A 01/19/2019   Procedure: POSTERIOR CERVICAL LAMINECTOMY Removal of Synovial Cyst and posterior cervical fusion;  Surgeon: Ashok Pall, MD;  Location: [place];  Service: Neurosurgery;  Laterality: N/A;  . TUBAL LIGATION    . tubaligation       reports that she has been smoking. She has a 11.25 pack-year smoking history. She has never used smokeless tobacco. She reports current alcohol use of about 2.0 standard drinks of alcohol per week. She reports that she does not use drugs.  Allergies  Allergen Reactions  . Darvon [Propoxyphene] Other (See Comments)    Hallicuations  . Oxycodone-Acetaminophen     Does not have any affect on her  . Penicillins Rash    Did it involve swelling of the face/tongue/throat, SOB, or low BP? Unknown Did it involve sudden or severe rash/hives, skin peeling, or any reaction on the inside of your mouth or nose? Unknown Did you need to seek medical attention at a hospital or doctor's office? Unknown  When did it last happen?Unknown If all above answers are "NO", may proceed with cephalosporin use.     Family History  Problem Relation Age of Onset  . Heart attack Mother   . Kidney failure Mother   . Diabetes Mother   . Heart disease Mother   . Hyperlipidemia Mother   . Hypertension Mother   . Heart attack Father 32  . Hypertension Father   . Hypertension Sister   . Diabetes Sister   . Other Brother        house fire  . Hypertension Brother   . Seizures Brother   . Heart disease Brother        mitral valve  .  Arthritis Daughter        DJD, AS fibromyalgia  . Polymyositis Daughter   . Cancer Maternal Grandfather        lung  . Heart disease Maternal Grandfather   . Other Paternal Grandmother        house fire  . Alcohol abuse Son     Prior to Admission medications   Medication Sig Start Date End Date Taking? Authorizing Provider  atorvastatin (LIPITOR) 20 MG tablet Take 1 tablet (20 mg total) by mouth daily. 01/30/20  Yes Ailene Ards, NP  baclofen (LIORESAL) 10 MG tablet Take 1 tablet (10 mg total) by mouth 3 (three) times daily. 09/24/19  Yes Lovorn, Jinny Blossom, MD  celecoxib (CELEBREX) 200 MG capsule TAKE (1) CAPSULE BY MOUTH EVERY 12 HOURS. Patient taking differently: Take 200 mg by mouth 2 (two) times daily. 02/27/20  Yes Gosrani, Nimish C, MD  Cholecalciferol 50 MCG (2000 UT) TABS Take 2,000 Int'l Units by mouth daily.   Yes [provider]  diazepam (VALIUM) 5 MG tablet TAKE (1) TABLET BY MOUTH EVERY SIX HOURS AS NEEDED FOR MUSCLE SPASMS. At least 3x/day- need to take it. For spasms 02/08/20  Yes Lovorn, Jinny Blossom, MD  gabapentin (NEURONTIN) 300 MG capsule Take 2 capsules (600 mg total) by mouth 3 (three) times daily. 02/05/19  Yes Angiulli, Lavon Paganini, PA-C  ibuprofen (ADVIL) 800 MG tablet TAKE (1) TABLET BY MOUTH DAILY AS NEEDED. Patient taking differently: Take 800 mg by mouth daily as needed. 05/29/19  Yes Gosrani, Nimish C, MD  traMADol (ULTRAM) 50 MG tablet Take 2 tablets (100 mg total) by mouth every 6 (six) hours as needed for moderate pain. 12/04/19  Yes Gosrani, Nimish C, MD  propranolol (INDERAL) 20 MG tablet Take 20 mg by mouth 2 (two) times daily.    [provider]  propranolol (INDERAL) 40 MG tablet Take 40 mg by mouth 2 (two) times daily. Patient not taking: Reported on 03/13/2020 12/18/19   [provider]    Physical Exam: Vitals:   03/13/20 1330 03/13/20 1400 03/13/20 1500 03/13/20 1530  BP: 120/88 (!) 97/52 (!) 128/99 97/68  Pulse:  (!) 109 (!) 106 (!)  102  Resp: (!) 33 (!) 27 (!) 26 19  Temp:      TempSrc:      SpO2:  97% 100% 100%  Weight:      Height:        Constitutional: NAD, calm, comfortable Vitals:   03/13/20 1330 03/13/20 1400 03/13/20 1500 03/13/20 1530  BP: 120/88 (!) 97/52 (!) 128/99 97/68  Pulse:  (!) 109 (!) 106 (!) 102  Resp: (!) 33 (!) 27 (!) 26 19  Temp:      TempSrc:      SpO2:  97% 100% 100%  Weight:      Height:       Eyes: PERRL, lids and conjunctivae normal ENMT: Mucous membranes are moist.  Neck: normal, supple, no masses, no thyromegaly Respiratory: clear to auscultation bilaterally, no wheezing, no crackles. Normal respiratory effort. No accessory muscle use.  Cardiovascular: Regular rate and rhythm, no murmurs / rubs / gallops. No extremity edema. 2+ pedal pulses. No carotid bruits.  Abdomen: no tenderness, no masses palpated. No hepatosplenomegaly. Bowel sounds positive.  Musculoskeletal: no clubbing / cyanosis. No joint deformity upper and lower extremities. Good ROM, no contractures. Skin: Decubitus ulcer to right buttock, surrounding tenderness and localized surrounding erythema.  No significant drainage, no appreciable induration, no appreciable fluctuance. Neurologic:  No apparent ground-level fall medically, 4+/5 strength bilateral upper extremities, 3/5 strength bilateral lower extremities-chronic per patient. Psychiatric: Normal judgment and insight. Alert and oriented x 3. Normal mood.         Labs on Admission: I have personally reviewed following labs and imaging studies  CBC: Recent Labs  Lab 03/13/20 1020  WBC 32.8*  NEUTROABS 29.3*  HGB 13.5  HCT 38.7  MCV 96.8  PLT [id number]   Basic Metabolic Panel: Recent Labs  Lab 03/13/20 1020  NA 133*  K 3.2*  CL 98  CO2 24  GLUCOSE 141*  BUN 11  CREATININE 0.64  CALCIUM 8.4*   Liver Function Tests: Recent Labs  Lab 03/13/20 1020  AST 19  ALT 21  ALKPHOS 78  BILITOT 1.5*  PROT 6.2*  ALBUMIN 3.0*   Coagulation  Profile: Recent Labs  Lab 03/13/20 1020  INR 1.1   Urine analysis:    Component Value Date/Time   COLORURINE AMBER (A) 03/13/2020 1020   APPEARANCEUR HAZY (A) 03/13/2020 1020   LABSPEC 1.017 03/13/2020 1020   PHURINE 6.0 03/13/2020 1020   GLUCOSEU NEGATIVE 03/13/2020 1020   HGBUR NEGATIVE 03/13/2020 1020   BILIRUBINUR NEGATIVE 03/13/2020 1020   KETONESUR 20 (A) 03/13/2020 1020   PROTEINUR 100 (A) 03/13/2020 1020   NITRITE NEGATIVE 03/13/2020 1020   LEUKOCYTESUR NEGATIVE 03/13/2020 1020    Radiological Exams on Admission: DG Shoulder Right  Result Date: 03/12/2020 CLINICAL DATA:  Pain following fall EXAM: RIGHT SHOULDER - 2+ VIEW COMPARISON:  None. FINDINGS: Frontal and Y scapular images were obtained. No fracture or dislocation. There is moderate generalized joint space narrowing. No erosive change or intra-articular calcification. Visualized right lung clear. IMPRESSION: Moderate generalized osteoarthritic change. No fracture or dislocation. Electronically Signed   By: Lowella Grip III M.D.   On: 03/12/2020 09:58   DG Chest Port 1 View  Result Date: 03/13/2020 CLINICAL DATA:  Sepsis. EXAM: PORTABLE CHEST 1 VIEW COMPARISON:  January 28, 2009. FINDINGS: The heart size and mediastinal contours are within normal limits. Both lungs are clear. The visualized skeletal structures are unremarkable. IMPRESSION: No active disease. Electronically Signed   By: Marijo Conception M.D.   On: 03/13/2020 11:29    EKG: Independently reviewed.   Assessment/Plan Principal Problem:   Decubital ulcer Active Problems:   Falls frequently   Multiple sclerosis (HCC)   SIRS (systemic inflammatory response syndrome) (HCC)   Decubitus ulcer-right buttock.  CT pelvis with contrast-phlegmonous changes to soft tissues.  No drainable fluid collection, no CT findings of acute osteomyelitis.  CT also shows possible early acute appendicitis, abdominal exam is benign. -Sacral wound would likely require  debridement, general surgery consult -N.p.o. at midnight -Continue IV antibiotics with ceftriaxone 1 g daily - IV morphine 2mg  Q4hr prn  SIRS-tachycardia 124, significant leukocytosis of 32.  Normal lactic acid 1.9.  Blood pressure 97 to 154. Urine not suggestive of infection.  Portable chest x-ray without acute abnormality.  CT of decubitus ulcer negative for fluid collection.  -Follow blood and urine cultures -Continue IV antibiotics with ceftriaxone  - 2 L Bolus given, cont N/s 100cc/hr x 20 hrs -Impressive leukocytosis considering CT findings, may need further work up.  Multiple sclerosis, ambulates with wheelchair and walker.  Reports multiple falls.  Really unable to take care of patient. -Will need placement on discharge - TOC consult,  PT consult -Continue baclofen, diazepam, gabapentin  Hypertension-blood pressures currently soft. -Hold propranolol.  DVT prophylaxis: SCDs for now Code Status: Full code Family Communication: None at bedside Disposition Plan: ~ 2 days, pending resolution of SIRs physiology.  Patient will need SNF placement. Consults called: Gen Surgery Admission status: Inpt, Tele I certify that at the point of admission it is my clinical judgment that the patient will require inpatient hospital care spanning beyond 2 midnights from the point of admission due to high intensity of service, high risk for further deterioration and high frequency of surveillance required.    Bethena Roys MD [facility]  03/13/2020, 6:08 PM

## 2020-03-13 NOTE — ED Notes (Signed)
Pt has unstageable wound to buttocks. Areas of black and yellow tissue. MD aware and observed at bedside. Foam dressing applied to sacrum, positioned to left side.

## 2020-03-13 NOTE — Progress Notes (Signed)
Pharmacy Antibiotic Note  Faith [name] is a 65 y.o. female admitted on 03/13/2020 with sepsis- unknown source of infection.  Pharmacy has been consulted for Vancomycin dosing.  Plan: Vancomycin 750 mg IV every 24 hours. Expected AUC 527. Cefepime 2000 mg IV x 1 dose. Monitor labs, c/s, and further antibiotics.  Height: 5\' 4"  (162.6 cm) Weight: 45.8 kg (101 lb) IBW/kg (Calculated) : 54.7  Temp (24hrs), Avg:98 F (36.7 C), Min:98 F (36.7 C), Max:98 F (36.7 C)  Recent Labs  Lab 03/13/20 1020 03/13/20 1122  WBC 32.8*  --   CREATININE 0.64  --   LATICACIDVEN  --  1.6    Estimated Creatinine Clearance: 52 mL/min (by C-G formula based on SCr of 0.64 mg/dL).    Allergies  Allergen Reactions  . Darvon [Propoxyphene] Other (See Comments)    Hallicuations  . Oxycodone-Acetaminophen     Does not have any affect on her  . Penicillins Rash    Did it involve swelling of the face/tongue/throat, SOB, or low BP? Unknown Did it involve sudden or severe rash/hives, skin peeling, or any reaction on the inside of your mouth or nose? Unknown Did you need to seek medical attention at a hospital or doctor's office? Unknown When did it last happen?Unknown If all above answers are "NO", may proceed with cephalosporin use.     Antimicrobials this admission: Vanco 1/13 >>  Cefepime 1/13 Flagyl 1/13   Microbiology results: 1/13 BCx: pending 1/13 UCx: pending    Thank you for allowing pharmacy to be a part of this patient's care.  Margot Ables, PharmD Clinical Pharmacist 03/13/2020 12:56 PM

## 2020-03-14 ENCOUNTER — Encounter (HOSPITAL_COMMUNITY): Payer: Self-pay | Admitting: Internal Medicine

## 2020-03-14 DIAGNOSIS — K3580 Unspecified acute appendicitis: Secondary | ICD-10-CM | POA: Diagnosis not present

## 2020-03-14 DIAGNOSIS — L89310 Pressure ulcer of right buttock, unstageable: Secondary | ICD-10-CM

## 2020-03-14 DIAGNOSIS — L89154 Pressure ulcer of sacral region, stage 4: Secondary | ICD-10-CM

## 2020-03-14 DIAGNOSIS — L89152 Pressure ulcer of sacral region, stage 2: Secondary | ICD-10-CM

## 2020-03-14 LAB — CBC
HCT: 31.7 % — ABNORMAL LOW (ref 36.0–46.0)
Hemoglobin: 10.7 g/dL — ABNORMAL LOW (ref 12.0–15.0)
MCH: 33 pg (ref 26.0–34.0)
MCHC: 33.8 g/dL (ref 30.0–36.0)
MCV: 97.8 fL (ref 80.0–100.0)
Platelets: 267 10*3/uL (ref 150–400)
RBC: 3.24 MIL/uL — ABNORMAL LOW (ref 3.87–5.11)
RDW: 13 % (ref 11.5–15.5)
WBC: 21.6 10*3/uL — ABNORMAL HIGH (ref 4.0–10.5)
nRBC: 0 % (ref 0.0–0.2)

## 2020-03-14 LAB — BASIC METABOLIC PANEL
Anion gap: 8 (ref 5–15)
BUN: 10 mg/dL (ref 8–23)
CO2: 21 mmol/L — ABNORMAL LOW (ref 22–32)
Calcium: 7.7 mg/dL — ABNORMAL LOW (ref 8.9–10.3)
Chloride: 108 mmol/L (ref 98–111)
Creatinine, Ser: 0.41 mg/dL — ABNORMAL LOW (ref 0.44–1.00)
GFR, Estimated: >60 mL/min (ref >60)
Glucose, Bld: 84 mg/dL (ref 70–99)
Potassium: 3 mmol/L — ABNORMAL LOW (ref 3.5–5.1)
Sodium: 137 mmol/L (ref 135–145)

## 2020-03-14 LAB — BLOOD CULTURE ID PANEL (REFLEXED) - BCID2

## 2020-03-14 LAB — MRSA PCR SCREENING: MRSA by PCR: NEGATIVE

## 2020-03-14 LAB — MAGNESIUM: Magnesium: 1.7 mg/dL (ref 1.7–2.4)

## 2020-03-14 MED ORDER — VANCOMYCIN HCL 750 MG/150ML IV SOLN
750.0000 mg | INTRAVENOUS | Status: DC
  Administered 2020-03-14 – 2020-03-17 (×4): 750 mg via INTRAVENOUS
  Filled 2020-03-14 (×4): qty 150

## 2020-03-14 MED ORDER — POTASSIUM CHLORIDE CRYS ER 20 MEQ PO TBCR
40.0000 meq | EXTENDED_RELEASE_TABLET | Freq: Once | ORAL | Status: AC
  Administered 2020-03-14: 40 meq via ORAL
  Filled 2020-03-14: qty 2

## 2020-03-14 MED ORDER — TRAMADOL HCL 50 MG PO TABS
100.0000 mg | ORAL_TABLET | Freq: Four times a day (QID) | ORAL | Status: DC | PRN
  Administered 2020-03-14 – 2020-03-30 (×27): 100 mg via ORAL
  Filled 2020-03-14 (×27): qty 2

## 2020-03-14 MED ORDER — CELECOXIB 100 MG PO CAPS
200.0000 mg | ORAL_CAPSULE | Freq: Two times a day (BID) | ORAL | Status: DC
  Administered 2020-03-14 – 2020-03-31 (×35): 200 mg via ORAL
  Filled 2020-03-14 (×35): qty 2

## 2020-03-14 MED ORDER — COLLAGENASE 250 UNIT/GM EX OINT
TOPICAL_OINTMENT | Freq: Every day | CUTANEOUS | Status: DC
  Administered 2020-03-29: 1 via TOPICAL
  Filled 2020-03-14: qty 30

## 2020-03-14 NOTE — Plan of Care (Signed)
  Problem: Acute Rehab PT Goals(only PT should resolve) Goal: Pt Will Go Supine/Side To Sit Outcome: Progressing Flowsheets (Taken 03/14/2020 0914) Pt will go Supine/Side to Sit: with moderate assist Goal: Pt Will Go Sit To Supine/Side Outcome: Progressing Flowsheets (Taken 03/14/2020 0914) Pt will go Sit to Supine/Side: with minimal assist Goal: Patient Will Transfer Sit To/From Stand Outcome: Progressing Flowsheets (Taken 03/14/2020 0914) Patient will transfer sit to/from stand:  with moderate assist  with maximum assist Goal: Pt Will Transfer Bed To Chair/Chair To Bed Outcome: Progressing Flowsheets (Taken 03/14/2020 0914) Pt will Transfer Bed to Chair/Chair to Bed:  with mod assist  with max assist   9:16 AM, 03/14/20 Mearl Latin PT, DPT Physical Therapist at [facility]

## 2020-03-14 NOTE — Consult Note (Signed)
  She has a positive blood culture with MRSA and on vancomycin.  No obvious source.  History of back surgeries.  Decubitus ulcer noted.   Will need TTE, repeat blood cultures, evaluation of spine if she has new back pain. TEE if TTE is unrevealing.   Can stop the ceftriaxone.         [facility] Staphylococcus aureus bacteremia   Staphylococcus aureus bacteremia (SAB) is associated with a high rate of complications and mortality.  Specific aspects of clinical management are critical to optimizing the outcome of patients with SAB.  Therefore, the [facility] Antimicrobial Management Team [facility]) has initiated an intervention aimed at improving the management of SAB at [facility].  To do so, Infectious Diseases physicians are providing an evidence-based consult for the management of all patients with SAB.     Yes No Comments  Perform follow-up blood cultures (even if the patient is afebrile) to ensure clearance of bacteremia [x]  []    Remove vascular catheter and obtain follow-up blood cultures after the removal of the catheter []  []  n/a  Perform echocardiography to evaluate for endocarditis (transthoracic ECHO is 40-50% sensitive, TEE is > 90% sensitive) [x]  []  Please keep in mind, that neither test can definitively EXCLUDE endocarditis, and that should clinical suspicion remain high for endocarditis the patient should then still be treated with an "endocarditis" duration of therapy = 6 weeks  Consult electrophysiologist to evaluate implanted cardiac device (pacemaker, ICD) []  [x]    Ensure source control []  [x]  Have all abscesses been drained effectively? Have deep seeded infections (septic joints or osteomyelitis) had appropriate surgical debridement?  Investigate for "metastatic" sites of infection Consider evaluation of spine with MRi as indicated.   [x]  []  Does the patient have ANY symptom or physical exam finding that would suggest a deeper infection (back or neck  pain that may be suggestive of vertebral osteomyelitis or epidural abscess, muscle pain that could be a symptom of pyomyositis)?  Keep in mind that for deep seeded infections MRI imaging with contrast is preferred rather than other often insensitive tests such as plain x-rays, especially early in a patient's presentation.  Change antibiotic therapy to __________________ []  [x]  Beta-lactam antibiotics are preferred for MSSA due to higher cure rates.   If on Vancomycin, goal trough should be 15 - 20 mcg/mL  Estimated duration of IV antibiotic therapy:  2-6 weeks, depending on above [x]  []  Consult case management for probably prolonged outpatient IV antibiotic therapy

## 2020-03-14 NOTE — Consult Note (Signed)
[facility] Consult  Reason for Consult: Sacral decubitus, ? Early appendicitis  Referring Physician:  Dr. Sherryll Burger    HPI: Faith [name] is a 65 y.o. female with MS, COPD, HTN who has been at home and comes in with weakness and a fall. She reported her family was not able to take care of her at home. She has a pressure ulcer that was noted on arrival, and she had a significant leukocytosis to 32.  She complained of no abdominal pain, nausea, vomiting or diarrhea.  She was scanned in the ED with a pelvic CT to look at the sacral area and was found to have no abscess and possible dilated appendix on CT.    She complains of pain in the buttock area. She otherwise reported she was hungry this AM and wanted food.  She has tolerated nabs and ensure without issues. She was placed on empiric antibiotics last night broad spectrum. Her WBC is down this Am.   Past Medical History:  Diagnosis Date  . Anemia    my whole life, Used to take iron  . Arthritis    spine, lumbar  . COPD (chronic obstructive pulmonary disease) (HCC)   . Emphysema of lung (HCC)   . HTN (hypertension) 03/01/2020  . Hyperlipidemia 11/28/2019  . Narcolepsy    by history, has nightmares  . Neuromuscular disorder (HCC)    leg issues from spine    Past Surgical History:  Procedure Laterality Date  . ANTERIOR CERVICAL DECOMP/DISCECTOMY FUSION N/A 01/19/2019   Procedure: ANTERIOR CERVICAL DECOMPRESSION/DISCECTOMY Cervical three - four;  Surgeon: Coletta Memos, MD;  Location: [facility] OR;  Service: Neurosurgery;  Laterality: N/A;  . EYE SURGERY Right   . ORIF FACIAL FRACTURE    . POSTERIOR CERVICAL LAMINECTOMY N/A 01/19/2019   Procedure: POSTERIOR CERVICAL LAMINECTOMY Removal of Synovial Cyst and posterior cervical fusion;  Surgeon: Coletta Memos, MD;  Location: [facility] OR;  Service: Neurosurgery;  Laterality: N/A;  . TUBAL LIGATION    . tubaligation      Family History  Problem Relation Age of Onset  . Heart attack  Mother   . Kidney failure Mother   . Diabetes Mother   . Heart disease Mother   . Hyperlipidemia Mother   . Hypertension Mother   . Heart attack Father 41  . Hypertension Father   . Hypertension Sister   . Diabetes Sister   . Other Brother        house fire  . Hypertension Brother   . Seizures Brother   . Heart disease Brother        mitral valve  . Arthritis Daughter        DJD, AS fibromyalgia  . Polymyositis Daughter   . Cancer Maternal Grandfather        lung  . Heart disease Maternal Grandfather   . Other Paternal Grandmother        house fire  . Alcohol abuse Son     Social History   Tobacco Use  . Smoking status: Current Every Day Smoker    Packs/day: 0.25    Years: 45.00    Pack years: 11.25  . Smokeless tobacco: Never Used  . Tobacco comment: cutting down  Vaping Use  . Vaping Use: Never used  Substance Use Topics  . Alcohol use: Yes    Alcohol/week: 2.0 standard drinks    Types: 2 Cans of beer per week  . Drug use: No    Medications:  I  have reviewed the patient's current medications. Prior to Admission:  Medications Prior to Admission  Medication Sig Dispense Refill Last Dose  . atorvastatin (LIPITOR) 20 MG tablet Take 1 tablet (20 mg total) by mouth daily. 90 tablet 0 03/12/2020 at Unknown time  . baclofen (LIORESAL) 10 MG tablet Take 1 tablet (10 mg total) by mouth 3 (three) times daily. 90 each 11 03/12/2020 at Unknown time  . celecoxib (CELEBREX) 200 MG capsule TAKE (1) CAPSULE BY MOUTH EVERY 12 HOURS. (Patient taking differently: Take 200 mg by mouth 2 (two) times daily.) 60 capsule 0 03/12/2020 at Unknown time  . Cholecalciferol 50 MCG (2000 UT) TABS Take 2,000 Int'l Units by mouth daily.   03/12/2020 at Unknown time  . diazepam (VALIUM) 5 MG tablet TAKE (1) TABLET BY MOUTH EVERY SIX HOURS AS NEEDED FOR MUSCLE SPASMS. At least 3x/day- need to take it. For spasms 90 tablet 5 03/12/2020 at Unknown time  . gabapentin (NEURONTIN) 300 MG capsule Take 2  capsules (600 mg total) by mouth 3 (three) times daily. 180 capsule 1 03/12/2020 at Unknown time  . ibuprofen (ADVIL) 800 MG tablet TAKE (1) TABLET BY MOUTH DAILY AS NEEDED. (Patient taking differently: Take 800 mg by mouth daily as needed.) 30 tablet 0 03/12/2020 at Unknown time  . traMADol (ULTRAM) 50 MG tablet Take 2 tablets (100 mg total) by mouth every 6 (six) hours as needed for moderate pain. 60 tablet 3 03/12/2020 at Unknown time  . propranolol (INDERAL) 20 MG tablet Take 20 mg by mouth 2 (two) times daily.   03/11/2020  . propranolol (INDERAL) 40 MG tablet Take 40 mg by mouth 2 (two) times daily. (Patient not taking: Reported on 03/13/2020)   Not Taking at 1900   Scheduled: . atorvastatin  20 mg Oral Daily  . baclofen  10 mg Oral TID  . celecoxib  200 mg Oral BID  . collagenase   Topical Daily  . gabapentin  600 mg Oral TID   Continuous: . sodium chloride 1,000 mL (03/13/20 1701)  . vancomycin 750 mg (03/14/20 1624)   [id number] **OR** acetaminophen, diazepam, morphine injection, ondansetron **OR** ondansetron (ZOFRAN) IV, polyethylene glycol, traMADol  Allergies  Allergen Reactions  . Darvon [Propoxyphene] Other (See Comments)    Hallicuations  . Oxycodone-Acetaminophen     Does not have any affect on her  . Penicillins Rash    Did it involve swelling of the face/tongue/throat, SOB, or low BP? Unknown Did it involve sudden or severe rash/hives, skin peeling, or any reaction on the inside of your mouth or nose? Unknown Did you need to seek medical attention at a hospital or doctor's office? Unknown When did it last happen?Unknown If all above answers are "NO", may proceed with cephalosporin use.      ROS:  A comprehensive review of systems was negative except for: Musculoskeletal: positive for back pain, neck pain and buttock/ sacral pain Neurological: positive for weakness and Chronic MS  Blood pressure 108/72, pulse 100, temperature 98.5 F (36.9 C),  temperature source Oral, resp. rate 18, height 5\' 4"  (1.626 m), weight 44 kg, SpO2 97 %. Physical Exam Vitals reviewed.  Constitutional:      General: She is not in acute distress.    Appearance: She is cachectic.  HENT:     Head: Normocephalic.     Mouth/Throat:     Mouth: Mucous membranes are moist.  Eyes:     Extraocular Movements: Extraocular movements intact.  Cardiovascular:  Rate and Rhythm: Normal rate.  Pulmonary:     Effort: Pulmonary effort is normal.  Abdominal:     General: There is no distension.     Palpations: Abdomen is soft.     Tenderness: There is no abdominal tenderness.  Genitourinary:    Comments: Sacral ulcer stage II, minor epithelial necrosis, no sloughing, firm, non boggy Musculoskeletal:        General: No swelling.  Skin:    General: Skin is warm.  Neurological:     Mental Status: She is alert and oriented to person, place, and time.  Psychiatric:        Mood and Affect: Mood normal.        Behavior: Behavior normal.       Results: Results for orders placed or performed during the hospital encounter of 03/13/20 (from the past 48 hour(s))  CBC with Differential/Platelet     Status: Abnormal   Collection Time: 03/13/20 10:20 AM  Result Value Ref Range   WBC 32.8 (H) 4.0 - 10.5 K/uL   RBC 4.00 3.87 - 5.11 MIL/uL   Hemoglobin 13.5 12.0 - 15.0 g/dL   HCT 38.7 36.0 - 46.0 %   MCV 96.8 80.0 - 100.0 fL   MCH 33.8 26.0 - 34.0 pg   MCHC 34.9 30.0 - 36.0 g/dL   RDW 13.0 11.5 - 15.5 %   Platelets 283 150 - 400 K/uL   nRBC 0.0 0.0 - 0.2 %   Neutrophils Relative % 89 %   Neutro Abs 29.3 (H) 1.7 - 7.7 K/uL   Lymphocytes Relative 3 %   Lymphs Abs 1.0 0.7 - 4.0 K/uL   Monocytes Relative 6 %   Monocytes Absolute 1.8 (H) 0.1 - 1.0 K/uL   Eosinophils Relative 0 %   Eosinophils Absolute 0.0 0.0 - 0.5 K/uL   Basophils Relative 0 %   Basophils Absolute 0.1 0.0 - 0.1 K/uL   Immature Granulocytes 2 %   Abs Immature Granulocytes 0.56 (H) 0.00 - 0.07  K/uL    Comment: Performed at [facility], [address] [place], [place] [address]  Comprehensive metabolic panel     Status: Abnormal   Collection Time: 03/13/20 10:20 AM  Result Value Ref Range   Sodium 133 (L) 135 - 145 mmol/L   Potassium 3.2 (L) 3.5 - 5.1 mmol/L   Chloride 98 98 - 111 mmol/L   CO2 24 22 - 32 mmol/L   Glucose, Bld 141 (H) 70 - 99 mg/dL    Comment: Glucose reference range applies only to samples taken after fasting for at least 8 hours.   BUN 11 8 - 23 mg/dL   Creatinine, Ser 0.64 0.44 - 1.00 mg/dL   Calcium 8.4 (L) 8.9 - 10.3 mg/dL   Total Protein 6.2 (L) 6.5 - 8.1 g/dL   Albumin 3.0 (L) 3.5 - 5.0 g/dL   AST 19 15 - 41 U/L   ALT 21 0 - 44 U/L   Alkaline Phosphatase 78 38 - 126 U/L   Total Bilirubin 1.5 (H) 0.3 - 1.2 mg/dL   GFR, Estimated >60 >60 mL/min    Comment: (NOTE) Calculated using the CKD-EPI Creatinine Equation (2021)    Anion gap 11 5 - 15    Comment: Performed at [facility], [address] [place], [place] [address]  Urinalysis, Routine w reflex microscopic Urine, Clean Catch     Status: Abnormal   Collection Time: 03/13/20 10:20 AM  Result Value Ref Range  Color, Urine AMBER (A) YELLOW    Comment: BIOCHEMICALS MAY BE AFFECTED BY COLOR   APPearance HAZY (A) CLEAR   Specific Gravity, Urine 1.017 1.005 - 1.030   pH 6.0 5.0 - 8.0   Glucose, UA NEGATIVE NEGATIVE mg/dL   Hgb urine dipstick NEGATIVE NEGATIVE   Bilirubin Urine NEGATIVE NEGATIVE   Ketones, ur 20 (A) NEGATIVE mg/dL   Protein, ur 100 (A) NEGATIVE mg/dL   Nitrite NEGATIVE NEGATIVE   Leukocytes,Ua NEGATIVE NEGATIVE   RBC / HPF 0-5 0 - 5 RBC/hpf   WBC, UA 11-20 0 - 5 WBC/hpf   Bacteria, UA RARE (A) NONE SEEN   Squamous Epithelial / LPF 6-10 0 - 5   Mucus PRESENT    Hyaline Casts, UA PRESENT     Comment: Performed at [facility], [address], [place], [place] [address]  Protime-INR     Status: None   Collection Time: 03/13/20 10:20 AM  Result Value Ref Range   Prothrombin  Time 14.2 11.4 - 15.2 seconds   INR 1.1 0.8 - 1.2    Comment: (NOTE) INR goal varies based on device and disease states. Performed at [facility], [address] [place], [place] [address]   APTT     Status: None   Collection Time: 03/13/20 10:20 AM  Result Value Ref Range   aPTT 29 24 - 36 seconds    Comment: Performed at [facility], [address] [place], [place] [address]  SARS CORONAVIRUS 2 (TAT 6-24 HRS) Nasopharyngeal Nasopharyngeal Swab     Status: None   Collection Time: 03/13/20 11:07 AM   Specimen: Nasopharyngeal Swab  Result Value Ref Range   SARS Coronavirus 2 NEGATIVE NEGATIVE    Comment: (NOTE) SARS-CoV-2 target nucleic acids are NOT DETECTED.  The SARS-CoV-2 RNA is generally detectable in upper and lower respiratory specimens during the acute phase of infection. Negative results do not preclude SARS-CoV-2 infection, do not rule out co-infections with other pathogens, and should not be used as the sole basis for treatment or other patient management decisions. Negative results must be combined with clinical observations, patient history, and epidemiological information. The expected result is Negative.  Fact Sheet for Patients: SugarRoll.be  Fact Sheet for Healthcare Providers: https://www.woods-mathews.com/  This test is not yet approved or cleared by the [place] FDA and  has been authorized for detection and/or diagnosis of SARS-CoV-2 by FDA under an Emergency Use Authorization (EUA). This EUA will remain  in effect (meaning this test can be used) for the duration of the COVID-19 declaration under Se ction 564(b)(1) of the Act, 21 U.S.C. section 360bbb-3(b)(1), unless the authorization is terminated or revoked sooner.  Performed at [facility], [place] [address], [place], [place] [address]   Lactic acid, plasma     Status: None   Collection Time: 03/13/20 11:22 AM  Result Value Ref Range   Lactic  Acid, Venous 1.6 0.5 - 1.9 mmol/L    Comment: Performed at [facility], [address], [place],  [address]  Blood culture (routine single)     Status: None (Preliminary result)   Collection Time: 03/13/20 11:22 AM   Specimen: BLOOD  Result Value Ref Range   Specimen Description      BLOOD RIGHT ANTECUBITAL Performed at [facility], [address], [place],  [address]    Special Requests      Blood Culture results may not be optimal due to an excessive volume of blood received in culture  bottles BOTTLES DRAWN AEROBIC AND ANAEROBIC Performed at [facility], [address], [place], [place] [address]    Culture  Setup Time      GRAM POSITIVE COCCI IN CLUSTERS AEROBIC BOTTLE ONLY Organism ID to follow Performed at [facility], [place] [address] [place], [place] [address]    Culture GRAM POSITIVE COCCI IN CLUSTERS    Report Status PENDING   Lactic acid, plasma     Status: None   Collection Time: 03/13/20  1:49 PM  Result Value Ref Range   Lactic Acid, Venous 1.5 0.5 - 1.9 mmol/L    Comment: Performed at [facility], [address], [place], [place] [address]  Resp Panel by RT-PCR (Flu A&B, Covid) Nasopharyngeal Swab     Status: None   Collection Time: 03/13/20  2:24 PM   Specimen: Nasopharyngeal Swab; Nasopharyngeal(NP) swabs in vial transport medium  Result Value Ref Range   SARS Coronavirus 2 by RT PCR NEGATIVE NEGATIVE    Comment: (NOTE) SARS-CoV-2 target nucleic acids are NOT DETECTED.  The SARS-CoV-2 RNA is generally detectable in upper respiratory specimens during the acute phase of infection. The lowest concentration of SARS-CoV-2 viral copies this assay can detect is 138 copies/mL. A negative result does not preclude SARS-Cov-2 infection and should not be used as the sole basis for treatment or other patient management decisions. A negative result may occur with  improper specimen collection/handling, submission of specimen other than nasopharyngeal  swab, presence of viral mutation(s) within the areas targeted by this assay, and inadequate number of viral copies(<138 copies/mL). A negative result must be combined with clinical observations, patient history, and epidemiological information. The expected result is Negative.  Fact Sheet for Patients:  EntrepreneurPulse.com.au  Fact Sheet for Healthcare Providers:  IncredibleEmployment.be  This test is no t yet approved or cleared by the [place] FDA and  has been authorized for detection and/or diagnosis of SARS-CoV-2 by FDA under an Emergency Use Authorization (EUA). This EUA will remain  in effect (meaning this test can be used) for the duration of the COVID-19 declaration under Section 564(b)(1) of the Act, 21 U.S.C.section 360bbb-3(b)(1), unless the authorization is terminated  or revoked sooner.       Influenza A by PCR NEGATIVE NEGATIVE   Influenza B by PCR NEGATIVE NEGATIVE    Comment: (NOTE) The Xpert Xpress SARS-CoV-2/FLU/RSV plus assay is intended as an aid in the diagnosis of influenza from Nasopharyngeal swab specimens and should not be used as a sole basis for treatment. Nasal washings and aspirates are unacceptable for Xpert Xpress SARS-CoV-2/FLU/RSV testing.  Fact Sheet for Patients: EntrepreneurPulse.com.au  Fact Sheet for Healthcare Providers: IncredibleEmployment.be  This test is not yet approved or cleared by the [place] FDA and has been authorized for detection and/or diagnosis of SARS-CoV-2 by FDA under an Emergency Use Authorization (EUA). This EUA will remain in effect (meaning this test can be used) for the duration of the COVID-19 declaration under Section 564(b)(1) of the Act, 21 U.S.C. section 360bbb-3(b)(1), unless the authorization is terminated or revoked.  Performed at [facility], [address], [place], [place] [id number]   Basic metabolic panel      Status: Abnormal   Collection Time: 03/14/20  5:44 AM  Result Value Ref Range   Sodium 137 135 - 145 mmol/L   Potassium 3.0 (L) 3.5 - 5.1 mmol/L   Chloride 108 98 - 111 mmol/L   CO2 21 (L) 22 - 32 mmol/L   Glucose, Bld  84 70 - 99 mg/dL    Comment: Glucose reference range applies only to samples taken after fasting for at least 8 hours.   BUN 10 8 - 23 mg/dL   Creatinine, Ser 0.41 (L) 0.44 - 1.00 mg/dL   Calcium 7.7 (L) 8.9 - 10.3 mg/dL   GFR, Estimated >60 >60 mL/min    Comment: (NOTE) Calculated using the CKD-EPI Creatinine Equation (2021)    Anion gap 8 5 - 15    Comment: Performed at [facility], [address], [place], [place] [address]  CBC     Status: Abnormal   Collection Time: 03/14/20  5:44 AM  Result Value Ref Range   WBC 21.6 (H) 4.0 - 10.5 K/uL   RBC 3.24 (L) 3.87 - 5.11 MIL/uL   Hemoglobin 10.7 (L) 12.0 - 15.0 g/dL   HCT 31.7 (L) 36.0 - 46.0 %   MCV 97.8 80.0 - 100.0 fL   MCH 33.0 26.0 - 34.0 pg   MCHC 33.8 30.0 - 36.0 g/dL   RDW 13.0 11.5 - 15.5 %   Platelets 267 150 - 400 K/uL   nRBC 0.0 0.0 - 0.2 %    Comment: Performed at [facility], [address], [place], [place] [address]  Magnesium     Status: None   Collection Time: 03/14/20  5:44 AM  Result Value Ref Range   Magnesium 1.7 1.7 - 2.4 mg/dL    Comment: Performed at [facility], [address] [place], [place] [address]   Personally reviewed and reviewed with Dr. Thornton [facility]- appendix is enlarged and enhancing some, some haziness; stranding and inflammatory changes deep to sacral area but no abscess or fluid  CT PELVIS W CONTRAST  Result Date: 03/13/2020 CLINICAL DATA:  65 year old female with decubitus sacral ulcer. EXAM: CT PELVIS WITH CONTRAST TECHNIQUE: Multidetector CT imaging of the pelvis was performed using the standard protocol following the bolus administration of intravenous contrast. CONTRAST:  68mL OMNIPAQUE IOHEXOL 300 MG/ML  SOLN COMPARISON:  None. FINDINGS: Urinary Tract:  The  urinary bladder is unremarkable. Bowel: No bowel dilatation. Mildly thickened appendix measuring approximately 11 mm in thickness. There is mild haziness of the peripancreatic Cl fat. Clinical correlation is recommended. Vascular/Lymphatic: Advanced aortoiliac atherosclerotic disease. No adenopathy. Reproductive: The uterus is retroverted and grossly unremarkable. No adnexal masses. Mildly dilated pelvic vasculature. Other: There is inflammatory changes and induration of the superficial soft tissues posterior and inferior to the distal sacrum/coccyx and to the right of the midline extending inferiorly along the intergluteal cleft. An ill-defined low attenuating area in the superficial soft tissues of the right buttock measures 5.4 x 4.5 cm (coronal 56/5). This most likely represents a phlegmonous tissue. No drainable fluid collection noted. Musculoskeletal: Osteopenia. No acute osseous pathology. No bone erosion or evidence of acute osteomyelitis. IMPRESSION: 1. Phlegmonous changes of the soft tissues posterior and inferior to the sacrum and to the right of the midline. No drainable fluid collection. No CT findings of acute osteomyelitis. 2. Mildly thickened appendix with mild haziness of the peripancreatic Cl fat. Clinical correlation is recommended to exclude early acute appendicitis. 3. Aortic Atherosclerosis (ICD10-I70.0). Electronically Signed   By: Anner Crete M.D.   On: 03/13/2020 17:23   DG Chest Port 1 View  Result Date: 03/13/2020 CLINICAL DATA:  Sepsis. EXAM: PORTABLE CHEST 1 VIEW COMPARISON:  January 28, 2009. FINDINGS: The heart size and mediastinal contours are within normal limits. Both lungs are clear. The visualized skeletal structures are unremarkable. IMPRESSION: No active  disease. Electronically Signed   By: Marijo Conception M.D.   On: 03/13/2020 11:29     Assessment & Plan:  Faith [name] is a 65 y.o. female with a sacral decubitus ulcer that is stage II but will potentially  progress due to the CT changes in the subcutaneous tissue. No debridement done now.  Will need to monitor. She also has CT findings concerning for appendicitis and a leukocytosis. She has no other source at this time. She has 1 bottle blood culture positive for GP but this could be a contaminant. Given the appendix on CT and leukocytosis I think we have to treat like possible appendicitis. Discussed with patient treatment of early non complicated appendicitis with antibiotics and that given her health and co-morbidities this is likely the best plan. Discussed failure rates of 40%.    -Continue antibiotics for potential appendicitis, allergic to PCN so cipro flagyl is fine -Tolerating diet and would continue for now -Agree with continuing Vanc until rule out the blood culture, Dr. Manuella Ghazi repeating tomorrow -Santyl and Mepilex dressing daily to sacral wound    All questions were answered to the satisfaction of the patient.    Virl Cagey 03/14/2020, 1:44 PM

## 2020-03-14 NOTE — Progress Notes (Addendum)
PROGRESS NOTE    Faith [name]  [id number] DOB: 1955-06-20 DOA: 03/13/2020 PCP: Doree Albee, MD   Brief Narrative:  Faith [name] is a 65 y.o. female with medical history significant for multiple sclerosis, COPD, pressure ulcer, hypertension. Patient was brought to the ED via EMS with complaints of fall, and family not being able to take care of her.  Patient was admitted with SIRS criteria and decubitus ulcer with concern for need for debridement.  She has had some worsening weakness and falls.  She is now noted to have MRSA bacteremia.  Assessment & Plan:   Principal Problem:   Decubital ulcer Active Problems:   Falls frequently   Multiple sclerosis (HCC)   SIRS (systemic inflammatory response syndrome) (HCC)   MRSA bacteremia -Decubitus ulcer noted, but no obvious source -Continue vancomycin and Rocephin discontinued -Repeat blood cultures ordered -Plan for 2D echocardiogram and consider TEE as needed -CT spine evaluation tomorrow if new or significant pain noted  Right buttock decubitus ulcer -Appears to be mostly stage II with no need for debridement per general surgery -Resume diet -IV morphine as needed for pain control  Multiple sclerosis -Multiple falls which may be related -PT evaluation recommending SNF which patient is agreeable to -Continue home pain medications  Hypokalemia -Replete and recheck in am  Hypertension -Currently with soft blood pressure readings -Propranolol currently held  DVT prophylaxis: SCDs Code Status: Full Family Communication: Discussed with daughter at bedside on 1/14 Disposition Plan:  Status is: Inpatient  Remains inpatient appropriate because:IV treatments appropriate due to intensity of illness or inability to take PO and Inpatient level of care appropriate due to severity of illness   Dispo: The patient is from: Home              Anticipated d/c is to: SNF              Anticipated d/c date is: 2 days               Patient currently is not medically stable to d/c. Patient needs further evaluation of MRSA bacteremia.   Consultants:   General surgery  Procedures:   See below  Antimicrobials:  Anti-infectives (From admission, onward)   Start     Dose/Rate Route Frequency Ordered Stop   03/14/20 1445  vancomycin (VANCOREADY) IVPB 750 mg/150 mL        750 mg 150 mL/hr over 60 Minutes Intravenous Every 24 hours 03/14/20 1358     03/14/20 1200  vancomycin (VANCOREADY) IVPB 750 mg/150 mL  Status:  Discontinued        750 mg 150 mL/hr over 60 Minutes Intravenous Every 24 hours 03/13/20 1253 03/13/20 1803   03/13/20 2300  cefTRIAXone (ROCEPHIN) 1 g in sodium chloride 0.9 % 100 mL IVPB  Status:  Discontinued        1 g 200 mL/hr over 30 Minutes Intravenous Every 24 hours 03/13/20 1803 03/14/20 1516   03/13/20 1145  ceFEPIme (MAXIPIME) 2 g in sodium chloride 0.9 % 100 mL IVPB        2 g 200 mL/hr over 30 Minutes Intravenous  Once 03/13/20 1130 03/13/20 1313   03/13/20 1145  metroNIDAZOLE (FLAGYL) IVPB 500 mg        500 mg 100 mL/hr over 60 Minutes Intravenous  Once 03/13/20 1130 03/13/20 1311   03/13/20 1115  levofloxacin (LEVAQUIN) IVPB 750 mg  Status:  Discontinued        750 mg 100 mL/hr  over 90 Minutes Intravenous  Once 03/13/20 1109 03/13/20 1127   03/13/20 1115  aztreonam (AZACTAM) 2 g in sodium chloride 0.9 % 100 mL IVPB  Status:  Discontinued        2 g 200 mL/hr over 30 Minutes Intravenous  Once 03/13/20 1109 03/13/20 1132   03/13/20 1115  vancomycin (VANCOCIN) IVPB 1000 mg/200 mL premix        1,000 mg 200 mL/hr over 60 Minutes Intravenous  Once 03/13/20 1109 03/13/20 1307       Subjective: Patient seen and evaluated today with no new acute complaints or concerns. No acute concerns or events noted overnight. She continues to have some weakness noted and denies much pain.  Objective: Vitals:   03/13/20 1805 03/13/20 2107 03/14/20 0325 03/14/20 0523  BP: 111/66 111/70 109/72 108/72   Pulse: (!) 101 (!) 112 (!) 102 100  Resp: 18  18 18   Temp: 97.8 F (36.6 C) 98.6 F (37 C) 99.3 F (37.4 C) 98.5 F (36.9 C)  TempSrc:    Oral  SpO2:  97% 96% 97%  Weight:      Height:        Intake/Output Summary (Last 24 hours) at 03/14/2020 1517 Last data filed at 03/14/2020 1423 Gross per 24 hour  Intake 1974.09 ml  Output 250 ml  Net 1724.09 ml   Filed Weights   03/13/20 1000 03/13/20 1755  Weight: 45.8 kg 44 kg    Examination:  General exam: Appears calm and comfortable  Respiratory system: Clear to auscultation. Respiratory effort normal. Cardiovascular system: S1 & S2 heard, RRR.  Gastrointestinal system: Abdomen is soft Central nervous system: Alert and awake Extremities: No edema Skin: As noted previously Psychiatry: Flat affect.    Data Reviewed: I have personally reviewed following labs and imaging studies  CBC: Recent Labs  Lab 03/13/20 1020 03/14/20 0544  WBC 32.8* 21.6*  NEUTROABS 29.3*  --   HGB 13.5 10.7*  HCT 38.7 31.7*  MCV 96.8 97.8  PLT 283 [id number]   Basic Metabolic Panel: Recent Labs  Lab 03/13/20 1020 03/14/20 0544  NA 133* 137  K 3.2* 3.0*  CL 98 108  CO2 24 21*  GLUCOSE 141* 84  BUN 11 10  CREATININE 0.64 0.41*  CALCIUM 8.4* 7.7*  MG  --  1.7   GFR: Estimated Creatinine Clearance: 50 mL/min (A) (by C-G formula based on SCr of 0.41 mg/dL (L)). Liver Function Tests: Recent Labs  Lab 03/13/20 1020  AST 19  ALT 21  ALKPHOS 78  BILITOT 1.5*  PROT 6.2*  ALBUMIN 3.0*   No results for input(s): LIPASE, AMYLASE in the last 168 hours. No results for input(s): AMMONIA in the last 168 hours. Coagulation Profile: Recent Labs  Lab 03/13/20 1020  INR 1.1   Cardiac Enzymes: No results for input(s): CKTOTAL, CKMB, CKMBINDEX, TROPONINI in the last 168 hours. BNP (last 3 results) No results for input(s): PROBNP in the last 8760 hours. HbA1C: No results for input(s): HGBA1C in the last 72 hours. CBG: No results for  input(s): GLUCAP in the last 168 hours. Lipid Profile: No results for input(s): CHOL, HDL, LDLCALC, TRIG, CHOLHDL, LDLDIRECT in the last 72 hours. Thyroid Function Tests: No results for input(s): TSH, T4TOTAL, FREET4, T3FREE, THYROIDAB in the last 72 hours. Anemia Panel: No results for input(s): VITAMINB12, FOLATE, FERRITIN, TIBC, IRON, RETICCTPCT in the last 72 hours. Sepsis Labs: Recent Labs  Lab 03/13/20 1122 03/13/20 1349  LATICACIDVEN 1.6 1.5  Recent Results (from the past 240 hour(s))  SARS CORONAVIRUS 2 (TAT 6-24 HRS) Nasopharyngeal Nasopharyngeal Swab     Status: None   Collection Time: 03/13/20 11:07 AM   Specimen: Nasopharyngeal Swab  Result Value Ref Range Status   SARS Coronavirus 2 NEGATIVE NEGATIVE Final    Comment: (NOTE) SARS-CoV-2 target nucleic acids are NOT DETECTED.  The SARS-CoV-2 RNA is generally detectable in upper and lower respiratory specimens during the acute phase of infection. Negative results do not preclude SARS-CoV-2 infection, do not rule out co-infections with other pathogens, and should not be used as the sole basis for treatment or other patient management decisions. Negative results must be combined with clinical observations, patient history, and epidemiological information. The expected result is Negative.  Fact Sheet for Patients: SugarRoll.be  Fact Sheet for Healthcare Providers: https://www.woods-mathews.com/  This test is not yet approved or cleared by the [place] FDA and  has been authorized for detection and/or diagnosis of SARS-CoV-2 by FDA under an Emergency Use Authorization (EUA). This EUA will remain  in effect (meaning this test can be used) for the duration of the COVID-19 declaration under Se ction 564(b)(1) of the Act, 21 U.S.C. section 360bbb-3(b)(1), unless the authorization is terminated or revoked sooner.  Performed at [facility], [place] [address],  [place], [place] [address]   Blood culture (routine single)     Status: None (Preliminary result)   Collection Time: 03/13/20 11:22 AM   Specimen: BLOOD  Result Value Ref Range Status   Specimen Description   Final    BLOOD RIGHT ANTECUBITAL Performed at [facility], [address] [place], [place] [address]    Special Requests   Final    Blood Culture results may not be optimal due to an excessive volume of blood received in culture bottles BOTTLES DRAWN AEROBIC AND ANAEROBIC Performed at [facility], [address], [place], [place] [address]    Culture  Setup Time   Final    GRAM POSITIVE COCCI IN CLUSTERS AEROBIC BOTTLE ONLY Organism ID to follow CRITICAL RESULT CALLED TO, READ BACK BY AND VERIFIED WITHGloris Manchester [id number] 03/14/20 A BROWNING Performed at [facility], [place] [address] [place], [place] [address]    Culture GRAM POSITIVE COCCI IN CLUSTERS  Final   Report Status PENDING  Incomplete  Blood Culture ID Panel (Reflexed)     Status: Abnormal   Collection Time: 03/13/20 11:22 AM  Result Value Ref Range Status   Enterococcus faecalis NOT DETECTED NOT DETECTED Final   Enterococcus Faecium NOT DETECTED NOT DETECTED Final   Listeria monocytogenes NOT DETECTED NOT DETECTED Final   Staphylococcus species DETECTED (A) NOT DETECTED Final    Comment: CRITICAL RESULT CALLED TO, READ BACK BY AND VERIFIED WITHGloris Manchester PHARMD 1425 03/14/20 A BROWNING    Staphylococcus aureus (BCID) DETECTED (A) NOT DETECTED Final    Comment: Methicillin (oxacillin)-resistant Staphylococcus aureus (MRSA). MRSA is predictably resistant to beta-lactam antibiotics (except ceftaroline). Preferred therapy is vancomycin unless clinically contraindicated. Patient requires contact precautions if  hospitalized. CRITICAL RESULT CALLED TO, READ BACK BY AND VERIFIED WITH: Gloris Manchester PHARMD 1425 03/14/20 A BROWNING    Staphylococcus epidermidis NOT DETECTED NOT DETECTED Final   Staphylococcus lugdunensis NOT  DETECTED NOT DETECTED Final   Streptococcus species NOT DETECTED NOT DETECTED Final   Streptococcus agalactiae NOT DETECTED NOT DETECTED Final   Streptococcus pneumoniae NOT DETECTED NOT DETECTED Final   Streptococcus pyogenes NOT DETECTED NOT DETECTED Final  A.calcoaceticus-baumannii NOT DETECTED NOT DETECTED Final   Bacteroides fragilis NOT DETECTED NOT DETECTED Final   Enterobacterales NOT DETECTED NOT DETECTED Final   Enterobacter cloacae complex NOT DETECTED NOT DETECTED Final   Escherichia coli NOT DETECTED NOT DETECTED Final   Klebsiella aerogenes NOT DETECTED NOT DETECTED Final   Klebsiella oxytoca NOT DETECTED NOT DETECTED Final   Klebsiella pneumoniae NOT DETECTED NOT DETECTED Final   Proteus species NOT DETECTED NOT DETECTED Final   Salmonella species NOT DETECTED NOT DETECTED Final   Serratia marcescens NOT DETECTED NOT DETECTED Final   Haemophilus influenzae NOT DETECTED NOT DETECTED Final   Neisseria meningitidis NOT DETECTED NOT DETECTED Final   Pseudomonas aeruginosa NOT DETECTED NOT DETECTED Final   Stenotrophomonas maltophilia NOT DETECTED NOT DETECTED Final   Candida albicans NOT DETECTED NOT DETECTED Final   Candida auris NOT DETECTED NOT DETECTED Final   Candida glabrata NOT DETECTED NOT DETECTED Final   Candida krusei NOT DETECTED NOT DETECTED Final   Candida parapsilosis NOT DETECTED NOT DETECTED Final   Candida tropicalis NOT DETECTED NOT DETECTED Final   Cryptococcus neoformans/gattii NOT DETECTED NOT DETECTED Final   Meth resistant mecA/C and MREJ DETECTED (A) NOT DETECTED Final    Comment: CRITICAL RESULT CALLED TO, READ BACK BY AND VERIFIED WITHGloris Manchester PHARMD 1425 03/14/20 A BROWNING Performed at [facility], [address] [place],  [address]   Resp Panel by RT-PCR (Flu A&B, Covid) Nasopharyngeal Swab     Status: None   Collection Time: 03/13/20  2:24 PM   Specimen: Nasopharyngeal Swab; Nasopharyngeal(NP) swabs in vial transport medium   Result Value Ref Range Status   SARS Coronavirus 2 by RT PCR NEGATIVE NEGATIVE Final    Comment: (NOTE) SARS-CoV-2 target nucleic acids are NOT DETECTED.  The SARS-CoV-2 RNA is generally detectable in upper respiratory specimens during the acute phase of infection. The lowest concentration of SARS-CoV-2 viral copies this assay can detect is 138 copies/mL. A negative result does not preclude SARS-Cov-2 infection and should not be used as the sole basis for treatment or other patient management decisions. A negative result may occur with  improper specimen collection/handling, submission of specimen other than nasopharyngeal swab, presence of viral mutation(s) within the areas targeted by this assay, and inadequate number of viral copies(<138 copies/mL). A negative result must be combined with clinical observations, patient history, and epidemiological information. The expected result is Negative.  Fact Sheet for Patients:  EntrepreneurPulse.com.au  Fact Sheet for Healthcare Providers:  IncredibleEmployment.be  This test is no t yet approved or cleared by the [place] FDA and  has been authorized for detection and/or diagnosis of SARS-CoV-2 by FDA under an Emergency Use Authorization (EUA). This EUA will remain  in effect (meaning this test can be used) for the duration of the COVID-19 declaration under Section 564(b)(1) of the Act, 21 U.S.C.section 360bbb-3(b)(1), unless the authorization is terminated  or revoked sooner.       Influenza A by PCR NEGATIVE NEGATIVE Final   Influenza B by PCR NEGATIVE NEGATIVE Final    Comment: (NOTE) The Xpert Xpress SARS-CoV-2/FLU/RSV plus assay is intended as an aid in the diagnosis of influenza from Nasopharyngeal swab specimens and should not be used as a sole basis for treatment. Nasal washings and aspirates are unacceptable for Xpert Xpress SARS-CoV-2/FLU/RSV testing.  Fact Sheet for  Patients: EntrepreneurPulse.com.au  Fact Sheet for Healthcare Providers: IncredibleEmployment.be  This test is not yet approved or cleared by the [place] FDA  and has been authorized for detection and/or diagnosis of SARS-CoV-2 by FDA under an Emergency Use Authorization (EUA). This EUA will remain in effect (meaning this test can be used) for the duration of the COVID-19 declaration under Section 564(b)(1) of the Act, 21 U.S.C. section 360bbb-3(b)(1), unless the authorization is terminated or revoked.  Performed at [facility], [address], [place], [place] [address]   Culture, blood (routine x 2)     Status: None (Preliminary result)   Collection Time: 03/14/20  2:05 PM   Specimen: BLOOD  Result Value Ref Range Status   Specimen Description BLOOD LEFT ANTECUBITAL  Final   Special Requests   Final    Blood Culture adequate volume BOTTLES DRAWN AEROBIC AND ANAEROBIC Performed at [facility], [address], [place], [place] [address]    Culture PENDING  Incomplete   Report Status PENDING  Incomplete  Culture, blood (routine x 2)     Status: None (Preliminary result)   Collection Time: 03/14/20  2:09 PM   Specimen: BLOOD  Result Value Ref Range Status   Specimen Description BLOOD BLOOD LEFT HAND  Final   Special Requests   Final    Blood Culture adequate volume BOTTLES DRAWN AEROBIC AND ANAEROBIC Performed at [facility], [address], [place], [place] [address]    Culture PENDING  Incomplete   Report Status PENDING  Incomplete         Radiology Studies: CT PELVIS W CONTRAST  Result Date: 03/13/2020 CLINICAL DATA:  65 year old female with decubitus sacral ulcer. EXAM: CT PELVIS WITH CONTRAST TECHNIQUE: Multidetector CT imaging of the pelvis was performed using the standard protocol following the bolus administration of intravenous contrast. CONTRAST:  85mL OMNIPAQUE IOHEXOL 300 MG/ML  SOLN COMPARISON:  None. FINDINGS: Urinary  Tract:  The urinary bladder is unremarkable. Bowel: No bowel dilatation. Mildly thickened appendix measuring approximately 11 mm in thickness. There is mild haziness of the peripancreatic Cl fat. Clinical correlation is recommended. Vascular/Lymphatic: Advanced aortoiliac atherosclerotic disease. No adenopathy. Reproductive: The uterus is retroverted and grossly unremarkable. No adnexal masses. Mildly dilated pelvic vasculature. Other: There is inflammatory changes and induration of the superficial soft tissues posterior and inferior to the distal sacrum/coccyx and to the right of the midline extending inferiorly along the intergluteal cleft. An ill-defined low attenuating area in the superficial soft tissues of the right buttock measures 5.4 x 4.5 cm (coronal 56/5). This most likely represents a phlegmonous tissue. No drainable fluid collection noted. Musculoskeletal: Osteopenia. No acute osseous pathology. No bone erosion or evidence of acute osteomyelitis. IMPRESSION: 1. Phlegmonous changes of the soft tissues posterior and inferior to the sacrum and to the right of the midline. No drainable fluid collection. No CT findings of acute osteomyelitis. 2. Mildly thickened appendix with mild haziness of the peripancreatic Cl fat. Clinical correlation is recommended to exclude early acute appendicitis. 3. Aortic Atherosclerosis (ICD10-I70.0). Electronically Signed   By: Anner Crete M.D.   On: 03/13/2020 17:23   DG Chest Port 1 View  Result Date: 03/13/2020 CLINICAL DATA:  Sepsis. EXAM: PORTABLE CHEST 1 VIEW COMPARISON:  January 28, 2009. FINDINGS: The heart size and mediastinal contours are within normal limits. Both lungs are clear. The visualized skeletal structures are unremarkable. IMPRESSION: No active disease. Electronically Signed   By: Marijo Conception M.D.   On: 03/13/2020 11:29        Scheduled Meds: . atorvastatin  20 mg Oral Daily  . baclofen  10 mg Oral TID  .  celecoxib  200 mg Oral BID   . gabapentin  600 mg Oral TID  . potassium chloride  40 mEq Oral Once   Continuous Infusions: . sodium chloride 1,000 mL (03/13/20 1701)  . vancomycin       LOS: 1 day    Time spent: 35 minutes    Bayard More Darleen Crocker, DO [facility]  If 7PM-7AM, please contact night-coverage [email or web address] 03/14/2020, 3:17 PM

## 2020-03-14 NOTE — TOC Initial Note (Signed)
Transition of Care [facility]) - Initial/Assessment Note    Patient Details  Name: Faith [name] MRN: [id number] Date of Birth: [birth year]  Transition of Care [facility]) CM/SW Contact:    Salome Arnt, LCSW Phone Number: 03/14/2020, 11:15 AM  Clinical Narrative:  LCSW completed assessment with pt. Pt from home with son and grandson. She indicates she uses an electric wheelchair and at baseline is able to transfer independently. Pt's son or grandson are with her close to around the clock per pt. She is active with [facility]. PT evaluated pt and recommend SNF. LCSW discussed placement process and pt is agreeable to start bed search. However, she states she may ultimately choose to return home with home health. Pt plans to talk to her family about disposition herself. LCSW initiated bed search and will follow up with bed offers when available. Pt states she has received Moderna vaccinations.                  Expected Discharge Plan: Skilled Nursing Facility Barriers to Discharge: Continued Medical Work up   Patient Goals and CMS Choice Patient states their goals for this hospitalization and ongoing recovery are:: SNF vs return home with home health   Choice offered to / list presented to : Patient  Expected Discharge Plan and Services Expected Discharge Plan: [place] In-house Referral: Clinical Social Work   Post Acute Care Choice: [place] Living arrangements for the past 2 months: [place]                 DME Arranged: N/A                    Prior Living Arrangements/Services Living arrangements for the past 2 months: Single Family Home Lives with:: Adult Children Patient language and need for interpreter reviewed:: Yes Do you feel safe going back to the place where you live?: Yes      Need for Family Participation in Patient Care: Yes (Comment)   Current home services: DME,Home PT,Home RN Hydrologist, BSC,  hospital bed) Criminal Activity/Legal Involvement Pertinent to Current Situation/Hospitalization: No - Comment as needed  Activities of Daily Living Home Assistive Devices/Equipment: Bedside commode/3-in-1,Cane (specify quad or straight),Electric scooter,Eyeglasses,Raised toilet seat with rails,Shower chair with back,Wheelchair,Walker (specify type) ADL Screening (condition at time of admission) Patient's cognitive ability adequate to safely complete daily activities?: Yes Is the patient deaf or have difficulty hearing?: No Does the patient have difficulty seeing, even when wearing glasses/contacts?: No Does the patient have difficulty concentrating, remembering, or making decisions?: No Patient able to express need for assistance with ADLs?: Yes Does the patient have difficulty dressing or bathing?: Yes Independently performs ADLs?: No Communication: Independent Dressing (OT): Needs assistance Is this a change from baseline?: Pre-admission baseline Grooming: Needs assistance Is this a change from baseline?: Pre-admission baseline Feeding: Needs assistance Is this a change from baseline?: Pre-admission baseline Bathing: Needs assistance Is this a change from baseline?: Pre-admission baseline Toileting: Needs assistance Is this a change from baseline?: Pre-admission baseline In/Out Bed: Needs assistance Is this a change from baseline?: Pre-admission baseline Walks in Home: Needs assistance Is this a change from baseline?: Pre-admission baseline Does the patient have difficulty walking or climbing stairs?: Yes Weakness of Legs: Both Weakness of Arms/Hands: Both  Permission Sought/Granted                  Emotional Assessment   Attitude/Demeanor/Rapport: Engaged Affect (typically observed): Appropriate Orientation: :  Oriented to Place,Oriented to Self,Oriented to  Time,Oriented to Situation Alcohol / Substance Use: Not Applicable Psych Involvement: No (comment)  Admission  diagnosis:  Weakness [R53.1] Decubital ulcer [L89.90] Urinary tract infection without hematuria, site unspecified [N39.0] Leukocytosis, unspecified type [D72.829] Patient Active Problem List   Diagnosis Date Noted  . Decubital ulcer 03/13/2020  . SIRS (systemic inflammatory response syndrome) ([place]) 03/13/2020  . Bilateral leg weakness 03/01/2020  . COPD (chronic obstructive pulmonary disease) ([place]) 03/01/2020  . HTN (hypertension) 03/01/2020  . Pressure injury of right buttock, stage 2 ([place]) 02/08/2020  . PVD (peripheral vascular disease) ([place]) 11/28/2019  . Hyperlipidemia 11/28/2019  . Wheelchair dependence 09/24/2019  . Cervical myelopathy ([place]) 01/22/2019  . Spinal cord compression due to degenerative disorder of spinal column 01/20/2019  . Synovial cyst   . Spondylolisthesis, cervical region 01/19/2019  . Chronic back pain 01/18/2019  . Multiple sclerosis exacerbation ([place]) 01/17/2019  . Erythrocytosis 09/13/2018  . Vitamin D deficiency 03/09/2016  . Multiple sclerosis ([place]) 02/24/2016  . Tobacco use 12/11/2015  . Aortic atherosclerosis ([place]) 12/11/2015  . 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 Falls frequently 12/11/2015  . 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 Other emphysema ([place]) 12/11/2015   PCP:  Doree Albee, MD Pharmacy:   [facility], [place] [address] Phone: [phone] Fax: [phone]     Social Determinants of Health (SDOH) Interventions    Readmission Risk Interventions No flowsheet data found.

## 2020-03-14 NOTE — Evaluation (Signed)
Physical Therapy Evaluation Patient Details Name: Faith [name] MRN: [id number] DOB: [birth year] Today's Date: 03/14/2020   History of Present Illness  Faith [name] is a 65 y.o. female with medical history significant for multiple sclerosis, COPD, pressure ulcer, hypertension.  Patient was brought to the ED via EMS with complaints of fall, and family not being able to take care of her.  My evaluation patient is awake and alert and able to give me detailed history.  Her knees have just been giving way.  She ambulates with a motorized wheelchair and a walker.  She did not hit her head.  Patient was in the ED yesterday for fall and right shoulder pain, x-ray showed moderate osteoarthritic change she was discharged home.  Family are unable to take care of patient.  She has had a buttock ulcer for a while.  She reports pain from the area.  She would like to be discharged to rehab facility after this hospitalization.  She denies pain with urination, but she reports some urinary frequency, no chest pain, no cough, no difficulty breathing, no vomiting no loose stools no abdominal pain.     Clinical Impression  Patient limited for functional mobility as stated below secondary to BLE weakness, fatigue and pain. Patient positioned with pillow under right buttock at beginning of session with LEs elevated. Patient able to roll with use of bed rails in min/mod assist. Patient able to move LEs without assist with c/o pain. Attempted to transfer patient to seated several times with assist to pull up but patient unable secondary to c/o pain and weakness. Patient repositioned in bed with pillow under left buttock. Patient will benefit from continued physical therapy in hospital and recommended venue below to increase strength, balance, endurance for safe ADLs and gait.     Follow Up Recommendations SNF    Equipment Recommendations  None recommended by PT    Recommendations for Other Services       Precautions /  Restrictions Precautions Precautions: Fall Precaution Comments: sacral ulcer Restrictions Weight Bearing Restrictions: No      Mobility  Bed Mobility Overal bed mobility: Needs Assistance Bed Mobility: Rolling Rolling: Min assist;Mod assist         General bed mobility comments: Patient requires assist and use of bed rails for rolling, attempted to pull to seated several times but patient limited by c/o sacral wound pain and is unable    Transfers                    Ambulation/Gait                Stairs            Wheelchair Mobility    Modified Rankin (Stroke Patients Only)       Balance                                             Pertinent Vitals/Pain Pain Assessment: 0-10 Pain Score: 2  Pain Location: bottom Pain Descriptors / Indicators: Grimacing;Guarding Pain Intervention(s): Limited activity within patient's tolerance;Monitored during session;Repositioned    Home Living Family/patient expects to be discharged to:: Private residence Living Arrangements: Children Available Help at Discharge: Family;Personal care attendant (has PCA 4x/week 2-3 hours per time) Type of Home: House Home Access: Ramped entrance     Home Layout: Two level;Able to live  on main level with bedroom/bathroom Home Equipment: Walker - 4 wheels;Wheelchair - power;Hospital bed;Bedside commode;Shower seat      Prior Function Level of Independence: Needs assistance   Gait / Transfers Assistance Needed: Patient reports she ambulates inside home with rollator or power wheelchair  ADL's / Homemaking Assistance Needed: Son assists and PCA 4x per week for 3 hours a day        Hand Dominance        Extremity/Trunk Assessment   Upper Extremity Assessment Upper Extremity Assessment: Generalized weakness    Lower Extremity Assessment Lower Extremity Assessment: Generalized weakness       Communication   Communication: No difficulties   Cognition Arousal/Alertness: Awake/alert Behavior During Therapy: WFL for tasks assessed/performed Overall Cognitive Status: Within Functional Limits for tasks assessed                                        General Comments      Exercises     Assessment/Plan    PT Assessment Patient needs continued PT services  PT Problem List Decreased strength;Decreased mobility;Decreased activity tolerance;Decreased balance;Pain;Decreased skin integrity       PT Treatment Interventions DME instruction;Therapeutic activities;Gait training;Therapeutic exercise;Patient/family education;Balance training;Functional mobility training;Neuromuscular re-education    PT Goals (Current goals can be found in the Care Plan section)  Acute Rehab PT Goals Patient Stated Goal: get strong and return home PT Goal Formulation: With patient Time For Goal Achievement: 03/28/20 Potential to Achieve Goals: Fair    Frequency Min 3X/week   Barriers to discharge        Co-evaluation               AM-PAC PT "6 Clicks" Mobility  Outcome Measure Help needed turning from your back to your side while in a flat bed without using bedrails?: A Lot Help needed moving from lying on your back to sitting on the side of a flat bed without using bedrails?: A Lot Help needed moving to and from a bed to a chair (including a wheelchair)?: A Lot Help needed standing up from a chair using your arms (e.g., wheelchair or bedside chair)?: A Lot Help needed to walk in hospital room?: A Lot Help needed climbing 3-5 steps with a railing? : Total 6 Click Score: 11    End of Session   Activity Tolerance: Patient limited by pain Patient left: in bed;with call bell/phone within reach;with bed alarm set Nurse Communication: Mobility status;Patient requests pain meds PT Visit Diagnosis: Unsteadiness on feet (R26.81);Muscle weakness (generalized) (M62.81);Other abnormalities of gait and mobility  (R26.89);Difficulty in walking, not elsewhere classified (R26.2);History of falling (Z91.81)    Time: 0626-9485 PT Time Calculation (min) (ACUTE ONLY): 16 min   Charges:   PT Evaluation $PT Eval Moderate Complexity: 1 Mod          9:09 AM, 03/14/20 Mearl Latin PT, DPT Physical Therapist at [facility]

## 2020-03-14 NOTE — NC FL2 (Signed)
[place] LEVEL OF CARE SCREENING TOOL     IDENTIFICATION  Patient Name: Faith [name] Birthdate: 14-May-1955 Sex: female Admission Date (Current Location): 03/13/2020  [place] and [place] Number:  [place] [id number] Facility and Address:  [facility] [address] [place]      Provider Number: [id number]  Attending Physician Name and Address:  Rodena Goldmann, DO  Relative Name and Phone Number:       Current Level of Care: Hospital Recommended Level of Care: [place] Prior Approval Number:    Date Approved/Denied:   PASRR Number: [id number]  Discharge Plan: SNF    Current Diagnoses: Patient Active Problem List   Diagnosis Date Noted  . Decubital ulcer 03/13/2020  . SIRS (systemic inflammatory response syndrome) ([place]) 03/13/2020  . Bilateral leg weakness 03/01/2020  . COPD (chronic obstructive pulmonary disease) ([place]) 03/01/2020  . HTN (hypertension) 03/01/2020  . Pressure injury of right buttock, stage 2 ([place]) 02/08/2020  . PVD (peripheral vascular disease) ([place]) 11/28/2019  . Hyperlipidemia 11/28/2019  . Wheelchair dependence 09/24/2019  . Cervical myelopathy ([place]) 01/22/2019  . Spinal cord compression due to degenerative disorder of spinal column 01/20/2019  . Synovial cyst   . Spondylolisthesis, cervical region 01/19/2019  . Chronic back pain 01/18/2019  . Multiple sclerosis exacerbation ([place]) 01/17/2019  . Erythrocytosis 09/13/2018  . Vitamin D deficiency 03/09/2016  . Multiple sclerosis ([place]) 02/24/2016  . Tobacco use 12/11/2015  . Aortic atherosclerosis ([place]) 12/11/2015  . 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 Falls frequently 12/11/2015  . 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 Other emphysema ([place]) 12/11/2015    Orientation RESPIRATION BLADDER Height & Weight     Self,Time,Place,Situation  Normal Incontinent Weight: 97 lb (44 kg) Height:  5'  4" (162.6 cm)  BEHAVIORAL SYMPTOMS/MOOD NEUROLOGICAL BOWEL NUTRITION STATUS      Continent Diet (Heart healthy. See d/c summary for updates.)  AMBULATORY STATUS COMMUNICATION OF NEEDS Skin   Total Care Verbally Skin abrasions,Bruising,Other (Comment) (Blister to buttocks. Deep tissue pressure injury to sacrum with foam dressing PRN. Stage 1 to left heel with foam dressing.)                       Personal Care Assistance Level of Assistance  Bathing,Dressing,Feeding Bathing Assistance: Maximum assistance Feeding assistance: Limited assistance Dressing Assistance: Maximum assistance     Functional Limitations Info  Sight,Hearing,Speech Sight Info: Impaired Hearing Info: Adequate Speech Info: Adequate    SPECIAL CARE FACTORS FREQUENCY  PT (By licensed PT)     PT Frequency: 5x weekly              Contractures      Additional Factors Info  Code Status,Allergies,Psychotropic Code Status Info: Full code Allergies Info: Darvon (propxyphene), Oxycodone-acetaminophen, Penicillins Psychotropic Info: Valium         Current Medications (03/14/2020):  This is the current hospital active medication list Current Facility-Administered Medications  Medication Dose Route Frequency Provider Last Rate Last Admin  . 0.9 %  sodium chloride infusion  1,000 mL Intravenous Continuous Emokpae, Ejiroghene E, MD 125 mL/hr at 03/13/20 1701 1,000 mL at 03/13/20 1701  . 0.9 %  sodium chloride infusion   Intravenous Continuous Emokpae, Ejiroghene E, MD 100 mL/hr at 03/14/20 1015 New Bag at 03/14/20 1015  . acetaminophen (TYLENOL) tablet 650 mg  650 mg Oral Q6H PRN Emokpae, Leanne Chang, MD  Or  . acetaminophen (TYLENOL) suppository 650 mg  650 mg Rectal Q6H PRN Emokpae, Ejiroghene E, MD      . atorvastatin (LIPITOR) tablet 20 mg  20 mg Oral Daily Emokpae, Ejiroghene E, MD   20 mg at 03/14/20 1012  . baclofen (LIORESAL) tablet 10 mg  10 mg Oral TID Emokpae, Ejiroghene E, MD   10 mg at  03/14/20 1012  . cefTRIAXone (ROCEPHIN) 1 g in sodium chloride 0.9 % 100 mL IVPB  1 g Intravenous Q24H Emokpae, Ejiroghene E, MD 200 mL/hr at 03/13/20 2344 1 g at 03/13/20 2344  . celecoxib (CELEBREX) capsule 200 mg  200 mg Oral BID Manuella Ghazi, Pratik D, DO      . diazepam (VALIUM) tablet 5 mg  5 mg Oral Q6H PRN Emokpae, Ejiroghene E, MD      . gabapentin (NEURONTIN) capsule 600 mg  600 mg Oral TID Emokpae, Ejiroghene E, MD   600 mg at 03/14/20 1011  . morphine 2 MG/ML injection 2 mg  2 mg Intravenous Q4H PRN Emokpae, Ejiroghene E, MD      . ondansetron (ZOFRAN) tablet 4 mg  4 mg Oral Q6H PRN Emokpae, Ejiroghene E, MD       Or  . ondansetron (ZOFRAN) injection 4 mg  4 mg Intravenous Q6H PRN Emokpae, Ejiroghene E, MD      . polyethylene glycol (MIRALAX / GLYCOLAX) packet 17 g  17 g Oral Daily PRN Emokpae, Ejiroghene E, MD      . traMADol (ULTRAM) tablet 100 mg  100 mg Oral Q6H PRN Heath Lark D, DO         Discharge Medications: Please see discharge summary for a list of discharge medications.  Relevant Imaging Results:  Relevant Lab Results:   Additional Information SSN: [id number]. Pt received Moderna vaccines.  Salome Arnt, LCSW

## 2020-03-15 ENCOUNTER — Inpatient Hospital Stay (HOSPITAL_COMMUNITY): Payer: Medicaid Other

## 2020-03-15 DIAGNOSIS — K3589 Other acute appendicitis without perforation or gangrene: Secondary | ICD-10-CM | POA: Diagnosis not present

## 2020-03-15 DIAGNOSIS — R7881 Bacteremia: Secondary | ICD-10-CM

## 2020-03-15 DIAGNOSIS — I361 Nonrheumatic tricuspid (valve) insufficiency: Secondary | ICD-10-CM | POA: Diagnosis not present

## 2020-03-15 DIAGNOSIS — L89310 Pressure ulcer of right buttock, unstageable: Secondary | ICD-10-CM | POA: Diagnosis not present

## 2020-03-15 LAB — BASIC METABOLIC PANEL
Anion gap: 5 (ref 5–15)
BUN: 13 mg/dL (ref 8–23)
CO2: 25 mmol/L (ref 22–32)
Calcium: 7.8 mg/dL — ABNORMAL LOW (ref 8.9–10.3)
Chloride: 107 mmol/L (ref 98–111)
Creatinine, Ser: 0.39 mg/dL — ABNORMAL LOW (ref 0.44–1.00)
GFR, Estimated: >60 mL/min (ref >60)
Glucose, Bld: 97 mg/dL (ref 70–99)
Potassium: 3.6 mmol/L (ref 3.5–5.1)
Sodium: 137 mmol/L (ref 135–145)

## 2020-03-15 LAB — ECHOCARDIOGRAM COMPLETE
Area-P 1/2: 4.54 cm2
Height: 64 in
S' Lateral: 3.27 cm
Single Plane A4C EF: 56.2 %
Weight: 1552.04 oz

## 2020-03-15 LAB — URINE CULTURE

## 2020-03-15 LAB — CBC
HCT: 31 % — ABNORMAL LOW (ref 36.0–46.0)
Hemoglobin: 10.3 g/dL — ABNORMAL LOW (ref 12.0–15.0)
MCH: 33.2 pg (ref 26.0–34.0)
MCHC: 33.2 g/dL (ref 30.0–36.0)
MCV: 100 fL (ref 80.0–100.0)
Platelets: 279 10*3/uL (ref 150–400)
RBC: 3.1 MIL/uL — ABNORMAL LOW (ref 3.87–5.11)
RDW: 13.3 % (ref 11.5–15.5)
WBC: 13.6 10*3/uL — ABNORMAL HIGH (ref 4.0–10.5)
nRBC: 0 % (ref 0.0–0.2)

## 2020-03-15 LAB — MAGNESIUM: Magnesium: 1.6 mg/dL — ABNORMAL LOW (ref 1.7–2.4)

## 2020-03-15 MED ORDER — CIPROFLOXACIN IN D5W 400 MG/200ML IV SOLN
400.0000 mg | Freq: Two times a day (BID) | INTRAVENOUS | Status: DC
  Administered 2020-03-15 – 2020-03-19 (×7): 400 mg via INTRAVENOUS
  Filled 2020-03-15 (×7): qty 200

## 2020-03-15 MED ORDER — METRONIDAZOLE IN NACL 5-0.79 MG/ML-% IV SOLN
500.0000 mg | Freq: Three times a day (TID) | INTRAVENOUS | Status: DC
  Administered 2020-03-15 – 2020-03-19 (×11): 500 mg via INTRAVENOUS
  Filled 2020-03-15 (×11): qty 100

## 2020-03-15 MED ORDER — MAGNESIUM SULFATE 2 GM/50ML IV SOLN
2.0000 g | Freq: Once | INTRAVENOUS | Status: AC
  Administered 2020-03-15: 2 g via INTRAVENOUS
  Filled 2020-03-15: qty 50

## 2020-03-15 NOTE — Progress Notes (Signed)
CC: appendicitis? Subjective: SHe has no complaints this am. Had some breakfast. No N/V. WBC trending down now 13 from 21. Afebrile CT pers. Reviewed mild dilation appendix not completely convinced is appendicitis but can not exclude it either. Decubitus stage II should respond to collagenase and wound care. She is on vanco but no other a/bs. Ceftriaxone discontinued yesterday  Objective: Vital signs in last 24 hours: Temp:  [98.1 F (36.7 C)-98.3 F (36.8 C)] 98.3 F (36.8 C) (01/15 0533) Pulse Rate:  [80-100] 80 (01/15 0533) Resp:  [16-18] 16 (01/15 0533) BP: (121-128)/(78-81) 121/81 (01/15 0533) SpO2:  [96 %-99 %] 99 % (01/15 0533) Last BM Date: 03/12/20  Intake/Output from previous day: 01/14 0701 - 01/15 0700 In: 1230 [P.O.:1080; IV Piggyback:150] Out: 450 [Urine:450] Intake/Output this shift: No intake/output data recorded.  Physical exam: NAD, alert Abd: soft, nt, no peritonitis. No rebound.  Ext: no edema Neuro: She is awake and alert.  Cranial nerves II to XII are intact.  No focal deficits.   Lab Results: CBC  Recent Labs    03/14/20 0544 03/15/20 0616  WBC 21.6* 13.6*  HGB 10.7* 10.3*  HCT 31.7* 31.0*  PLT 267 279   BMET Recent Labs    03/14/20 0544 03/15/20 0616  NA 137 137  K 3.0* 3.6  CL 108 107  CO2 21* 25  GLUCOSE 84 97  BUN 10 13  CREATININE 0.41* 0.39*  CALCIUM 7.7* 7.8*   PT/INR Recent Labs    03/13/20 1020  LABPROT 14.2  INR 1.1   ABG No results for input(s): PHART, HCO3 in the last 72 hours.  Invalid input(s): PCO2, PO2  Studies/Results: CT PELVIS W CONTRAST  Result Date: 03/13/2020 CLINICAL DATA:  65 year old female with decubitus sacral ulcer. EXAM: CT PELVIS WITH CONTRAST TECHNIQUE: Multidetector CT imaging of the pelvis was performed using the standard protocol following the bolus administration of intravenous contrast. CONTRAST:  67mL OMNIPAQUE IOHEXOL 300 MG/ML  SOLN COMPARISON:  None. FINDINGS: Urinary Tract:  The  urinary bladder is unremarkable. Bowel: No bowel dilatation. Mildly thickened appendix measuring approximately 11 mm in thickness. There is mild haziness of the peripancreatic Cl fat. Clinical correlation is recommended. Vascular/Lymphatic: Advanced aortoiliac atherosclerotic disease. No adenopathy. Reproductive: The uterus is retroverted and grossly unremarkable. No adnexal masses. Mildly dilated pelvic vasculature. Other: There is inflammatory changes and induration of the superficial soft tissues posterior and inferior to the distal sacrum/coccyx and to the right of the midline extending inferiorly along the intergluteal cleft. An ill-defined low attenuating area in the superficial soft tissues of the right buttock measures 5.4 x 4.5 cm (coronal 56/5). This most likely represents a phlegmonous tissue. No drainable fluid collection noted. Musculoskeletal: Osteopenia. No acute osseous pathology. No bone erosion or evidence of acute osteomyelitis. IMPRESSION: 1. Phlegmonous changes of the soft tissues posterior and inferior to the sacrum and to the right of the midline. No drainable fluid collection. No CT findings of acute osteomyelitis. 2. Mildly thickened appendix with mild haziness of the peripancreatic Cl fat. Clinical correlation is recommended to exclude early acute appendicitis. 3. Aortic Atherosclerosis (ICD10-I70.0). Electronically Signed   By: Anner Crete M.D.   On: 03/13/2020 17:23    Anti-infectives: Anti-infectives (From admission, onward)   Start     Dose/Rate Route Frequency Ordered Stop   03/14/20 1445  vancomycin (VANCOREADY) IVPB 750 mg/150 mL        750 mg 150 mL/hr over 60 Minutes Intravenous Every 24 hours 03/14/20 1358  03/14/20 1200  vancomycin (VANCOREADY) IVPB 750 mg/150 mL  Status:  Discontinued        750 mg 150 mL/hr over 60 Minutes Intravenous Every 24 hours 03/13/20 1253 03/13/20 1803   03/13/20 2300  cefTRIAXone (ROCEPHIN) 1 g in sodium chloride 0.9 % 100 mL IVPB   Status:  Discontinued        1 g 200 mL/hr over 30 Minutes Intravenous Every 24 hours 03/13/20 1803 03/14/20 1516   03/13/20 1145  ceFEPIme (MAXIPIME) 2 g in sodium chloride 0.9 % 100 mL IVPB        2 g 200 mL/hr over 30 Minutes Intravenous  Once 03/13/20 1130 03/13/20 1313   03/13/20 1145  metroNIDAZOLE (FLAGYL) IVPB 500 mg        500 mg 100 mL/hr over 60 Minutes Intravenous  Once 03/13/20 1130 03/13/20 1311   03/13/20 1115  levofloxacin (LEVAQUIN) IVPB 750 mg  Status:  Discontinued        750 mg 100 mL/hr over 90 Minutes Intravenous  Once 03/13/20 1109 03/13/20 1127   03/13/20 1115  aztreonam (AZACTAM) 2 g in sodium chloride 0.9 % 100 mL IVPB  Status:  Discontinued        2 g 200 mL/hr over 30 Minutes Intravenous  Once 03/13/20 1109 03/13/20 1132   03/13/20 1115  vancomycin (VANCOCIN) IVPB 1000 mg/200 mL premix        1,000 mg 200 mL/hr over 60 Minutes Intravenous  Once 03/13/20 1109 03/13/20 1307      Assessment/Plan:  65 year old female with incidental findings concerning for appendicitis.  Clinically not a clear-cut case and not a classic picture FOR APPENDICITIS.  Currently we are treating appendicitis medically with antibiotics and she is doing well without any evidence of worsening on her clinical condition or peritonitis that would merit surgical intervention.  We will start appropiate antibiotic therapy ( cipro and flagyl) and serial abdominal exams.  There is no need for surgical indication at this time.  At some point time as an outpatient may reconsider to evaluate the need for potential appendectomy.  Please note that I have spent at least 35 minutes in this encounter with greater than 50% spent in coordination and counseling of her care  Caroleen Hamman, MD, [facility]  03/15/2020

## 2020-03-15 NOTE — Progress Notes (Signed)
PROGRESS NOTE    Faith [name]  JOA:[id number] DOB: Jun 15, 1955 DOA: 03/13/2020 PCP: Doree Albee, MD   Brief Narrative:  Faith Bailiff Brownis a 65 y.o.femalewith medical history significant formultiple sclerosis, COPD, pressure ulcer, hypertension. Patient was brought to the ED via EMS with complaints of fall, and family not being able to take care of her.  Patient was admitted with SIRS criteria and decubitus ulcer with concern for need for debridement.  She has had some worsening weakness and falls.  She is now noted to have MRSA bacteremia.  Assessment & Plan:   Principal Problem:   Decubital ulcer Active Problems:   Falls frequently   Multiple sclerosis (HCC)   SIRS (systemic inflammatory response syndrome) (HCC)   Sacral decubitus ulcer, stage II (HCC)   Acute appendicitis   MRSA bacteremia -Decubitus ulcer noted, but no obvious source -Continue vancomycin  -Repeat blood cultures negative -Plan for 2D echocardiogram and consider TEE as needed -No new back pain  Right buttock decubitus ulcer -Appears to be mostly stage II with no need for debridement per general surgery -Continue diet -IV morphine as needed for pain control  Multiple sclerosis -Multiple falls which may be related -PT evaluation recommending SNF which patient is agreeable to -Continue home pain medications  Hypomagnesemia -Replete and recheck in am  Hypertension -Propranolol currently held; monitor  DVT prophylaxis: SCDs Code Status: Full Family Communication: Discussed with daughter at bedside on 1/14 Disposition Plan:  Status is: Inpatient  Remains inpatient appropriate because:IV treatments appropriate due to intensity of illness or inability to take PO and Inpatient level of care appropriate due to severity of illness   Dispo: The patient is from: Home  Anticipated d/c is to: SNF  Anticipated d/c date is: 2 days  Patient currently is not  medically stable to d/c. Patient needs further evaluation of MRSA bacteremia. 2D echo pending.   Consultants:   General surgery  Procedures:   See below  Antimicrobials:  Anti-infectives (From admission, onward)   Start     Dose/Rate Route Frequency Ordered Stop   03/14/20 1445  vancomycin (VANCOREADY) IVPB 750 mg/150 mL        750 mg 150 mL/hr over 60 Minutes Intravenous Every 24 hours 03/14/20 1358     03/14/20 1200  vancomycin (VANCOREADY) IVPB 750 mg/150 mL  Status:  Discontinued        750 mg 150 mL/hr over 60 Minutes Intravenous Every 24 hours 03/13/20 1253 03/13/20 1803   03/13/20 2300  cefTRIAXone (ROCEPHIN) 1 g in sodium chloride 0.9 % 100 mL IVPB  Status:  Discontinued        1 g 200 mL/hr over 30 Minutes Intravenous Every 24 hours 03/13/20 1803 03/14/20 1516   03/13/20 1145  ceFEPIme (MAXIPIME) 2 g in sodium chloride 0.9 % 100 mL IVPB        2 g 200 mL/hr over 30 Minutes Intravenous  Once 03/13/20 1130 03/13/20 1313   03/13/20 1145  metroNIDAZOLE (FLAGYL) IVPB 500 mg        500 mg 100 mL/hr over 60 Minutes Intravenous  Once 03/13/20 1130 03/13/20 1311   03/13/20 1115  levofloxacin (LEVAQUIN) IVPB 750 mg  Status:  Discontinued        750 mg 100 mL/hr over 90 Minutes Intravenous  Once 03/13/20 1109 03/13/20 1127   03/13/20 1115  aztreonam (AZACTAM) 2 g in sodium chloride 0.9 % 100 mL IVPB  Status:  Discontinued  2 g 200 mL/hr over 30 Minutes Intravenous  Once 03/13/20 1109 03/13/20 1132   03/13/20 1115  vancomycin (VANCOCIN) IVPB 1000 mg/200 mL premix        1,000 mg 200 mL/hr over 60 Minutes Intravenous  Once 03/13/20 1109 03/13/20 1307       Subjective: Patient seen and evaluated today with no new acute complaints or concerns. No acute concerns or events noted overnight.  Objective: Vitals:   03/14/20 0325 03/14/20 0523 03/14/20 2041 03/15/20 0533  BP: 109/72 108/72 128/78 121/81  Pulse: (!) 102 100 100 80  Resp: 18 18 18 16   Temp: 99.3 F (37.4  C) 98.5 F (36.9 C) 98.1 F (36.7 C) 98.3 F (36.8 C)  TempSrc:  Oral Oral Oral  SpO2: 96% 97% 96% 99%  Weight:      Height:        Intake/Output Summary (Last 24 hours) at 03/15/2020 1114 Last data filed at 03/15/2020 0500 Gross per 24 hour  Intake 1110 ml  Output 450 ml  Net 660 ml   Filed Weights   03/13/20 1000 03/13/20 1755  Weight: 45.8 kg 44 kg    Examination:  General exam: Appears calm and comfortable  Respiratory system: Clear to auscultation. Respiratory effort normal. Cardiovascular system: S1 & S2 heard, RRR.  Gastrointestinal system: Abdomen is soft Central nervous system: Alert and awake Extremities: No edema Skin: Stg 2 sacral decubitus ulcer as noted previously Psychiatry: Flat affect.    Data Reviewed: I have personally reviewed following labs and imaging studies  CBC: Recent Labs  Lab 03/13/20 1020 03/14/20 0544 03/15/20 0616  WBC 32.8* 21.6* 13.6*  NEUTROABS 29.3*  --   --   HGB 13.5 10.7* 10.3*  HCT 38.7 31.7* 31.0*  MCV 96.8 97.8 100.0  PLT 283 267 [id number]   Basic Metabolic Panel: Recent Labs  Lab 03/13/20 1020 03/14/20 0544 03/15/20 0616  NA 133* 137 137  K 3.2* 3.0* 3.6  CL 98 108 107  CO2 24 21* 25  GLUCOSE 141* 84 97  BUN 11 10 13   CREATININE 0.64 0.41* 0.39*  CALCIUM 8.4* 7.7* 7.8*  MG  --  1.7 1.6*   GFR: Estimated Creatinine Clearance: 50 mL/min (A) (by C-G formula based on SCr of 0.39 mg/dL (L)). Liver Function Tests: Recent Labs  Lab 03/13/20 1020  AST 19  ALT 21  ALKPHOS 78  BILITOT 1.5*  PROT 6.2*  ALBUMIN 3.0*   No results for input(s): LIPASE, AMYLASE in the last 168 hours. No results for input(s): AMMONIA in the last 168 hours. Coagulation Profile: Recent Labs  Lab 03/13/20 1020  INR 1.1   Cardiac Enzymes: No results for input(s): CKTOTAL, CKMB, CKMBINDEX, TROPONINI in the last 168 hours. BNP (last 3 results) No results for input(s): PROBNP in the last 8760 hours. HbA1C: No results for input(s):  HGBA1C in the last 72 hours. CBG: No results for input(s): GLUCAP in the last 168 hours. Lipid Profile: No results for input(s): CHOL, HDL, LDLCALC, TRIG, CHOLHDL, LDLDIRECT in the last 72 hours. Thyroid Function Tests: No results for input(s): TSH, T4TOTAL, FREET4, T3FREE, THYROIDAB in the last 72 hours. Anemia Panel: No results for input(s): VITAMINB12, FOLATE, FERRITIN, TIBC, IRON, RETICCTPCT in the last 72 hours. Sepsis Labs: Recent Labs  Lab 03/13/20 1122 03/13/20 1349  LATICACIDVEN 1.6 1.5    Recent Results (from the past 240 hour(s))  Urine culture     Status: Abnormal   Collection Time: 03/13/20 10:25 AM  Specimen: In/Out Cath Urine  Result Value Ref Range Status   Specimen Description   Final    IN/OUT CATH URINE Performed at [facility], [address] [place], [place] [address]    Special Requests   Final    NONE Performed at [facility], [address], [place], [place] [address]    Culture MULTIPLE SPECIES PRESENT, SUGGEST RECOLLECTION (A)  Final   Report Status 03/15/2020 FINAL  Final  SARS CORONAVIRUS 2 (TAT 6-24 HRS) Nasopharyngeal Nasopharyngeal Swab     Status: None   Collection Time: 03/13/20 11:07 AM   Specimen: Nasopharyngeal Swab  Result Value Ref Range Status   SARS Coronavirus 2 NEGATIVE NEGATIVE Final    Comment: (NOTE) SARS-CoV-2 target nucleic acids are NOT DETECTED.  The SARS-CoV-2 RNA is generally detectable in upper and lower respiratory specimens during the acute phase of infection. Negative results do not preclude SARS-CoV-2 infection, do not rule out co-infections with other pathogens, and should not be used as the sole basis for treatment or other patient management decisions. Negative results must be combined with clinical observations, patient history, and epidemiological information. The expected result is Negative.  Fact Sheet for Patients: SugarRoll.be  Fact Sheet for Healthcare  Providers: https://www.woods-mathews.com/  This test is not yet approved or cleared by the [place] FDA and  has been authorized for detection and/or diagnosis of SARS-CoV-2 by FDA under an Emergency Use Authorization (EUA). This EUA will remain  in effect (meaning this test can be used) for the duration of the COVID-19 declaration under Se ction 564(b)(1) of the Act, 21 U.S.C. section 360bbb-3(b)(1), unless the authorization is terminated or revoked sooner.  Performed at [facility], [place] [address] [place], [place] [address]   Blood culture (routine single)     Status: Abnormal (Preliminary result)   Collection Time: 03/13/20 11:22 AM   Specimen: BLOOD  Result Value Ref Range Status   Specimen Description   Final    BLOOD RIGHT ANTECUBITAL Performed at [facility], [address] [place], [place] [address]    Special Requests   Final    Blood Culture results may not be optimal due to an excessive volume of blood received in culture bottles BOTTLES DRAWN AEROBIC AND ANAEROBIC Performed at [facility], [address], [place], [place] [address]    Culture  Setup Time   Final    GRAM POSITIVE COCCI IN CLUSTERS AEROBIC BOTTLE ONLY Organism ID to follow CRITICAL RESULT CALLED TO, READ BACK BY AND VERIFIED WITHGloris Manchester [id number] 03/14/20 A BROWNING Performed at [facility], [place] [address], [place], [place] [address]    Culture STAPHYLOCOCCUS AUREUS (A)  Final   Report Status PENDING  Incomplete  Blood Culture ID Panel (Reflexed)     Status: Abnormal   Collection Time: 03/13/20 11:22 AM  Result Value Ref Range Status   Enterococcus faecalis NOT DETECTED NOT DETECTED Final   Enterococcus Faecium NOT DETECTED NOT DETECTED Final   Listeria monocytogenes NOT DETECTED NOT DETECTED Final   Staphylococcus species DETECTED (A) NOT DETECTED Final    Comment: CRITICAL RESULT CALLED TO, READ BACK BY AND VERIFIED WITHGloris Manchester PHARMD 1425 03/14/20 A  BROWNING    Staphylococcus aureus (BCID) DETECTED (A) NOT DETECTED Final    Comment: Methicillin (oxacillin)-resistant Staphylococcus aureus (MRSA). MRSA is predictably resistant to beta-lactam antibiotics (except ceftaroline). Preferred therapy is vancomycin unless clinically contraindicated. Patient requires contact precautions if  hospitalized. CRITICAL RESULT CALLED TO,  READ BACK BY AND VERIFIED WITH: Gloris Manchester [id number] 03/14/20 A BROWNING    Staphylococcus epidermidis NOT DETECTED NOT DETECTED Final   Staphylococcus lugdunensis NOT DETECTED NOT DETECTED Final   Streptococcus species NOT DETECTED NOT DETECTED Final   Streptococcus agalactiae NOT DETECTED NOT DETECTED Final   Streptococcus pneumoniae NOT DETECTED NOT DETECTED Final   Streptococcus pyogenes NOT DETECTED NOT DETECTED Final   A.calcoaceticus-baumannii NOT DETECTED NOT DETECTED Final   Bacteroides fragilis NOT DETECTED NOT DETECTED Final   Enterobacterales NOT DETECTED NOT DETECTED Final   Enterobacter cloacae complex NOT DETECTED NOT DETECTED Final   Escherichia coli NOT DETECTED NOT DETECTED Final   Klebsiella aerogenes NOT DETECTED NOT DETECTED Final   Klebsiella oxytoca NOT DETECTED NOT DETECTED Final   Klebsiella pneumoniae NOT DETECTED NOT DETECTED Final   Proteus species NOT DETECTED NOT DETECTED Final   Salmonella species NOT DETECTED NOT DETECTED Final   Serratia marcescens NOT DETECTED NOT DETECTED Final   Haemophilus influenzae NOT DETECTED NOT DETECTED Final   Neisseria meningitidis NOT DETECTED NOT DETECTED Final   Pseudomonas aeruginosa NOT DETECTED NOT DETECTED Final   Stenotrophomonas maltophilia NOT DETECTED NOT DETECTED Final   Candida albicans NOT DETECTED NOT DETECTED Final   Candida auris NOT DETECTED NOT DETECTED Final   Candida glabrata NOT DETECTED NOT DETECTED Final   Candida krusei NOT DETECTED NOT DETECTED Final   Candida parapsilosis NOT DETECTED NOT DETECTED Final   Candida tropicalis NOT  DETECTED NOT DETECTED Final   Cryptococcus neoformans/gattii NOT DETECTED NOT DETECTED Final   Meth resistant mecA/C and MREJ DETECTED (A) NOT DETECTED Final    Comment: CRITICAL RESULT CALLED TO, READ BACK BY AND VERIFIED WITHGloris Manchester PHARMD 1425 03/14/20 A BROWNING Performed at [facility], [address], [place], [place] [address]   Resp Panel by RT-PCR (Flu A&B, Covid) Nasopharyngeal Swab     Status: None   Collection Time: 03/13/20  2:24 PM   Specimen: Nasopharyngeal Swab; Nasopharyngeal(NP) swabs in vial transport medium  Result Value Ref Range Status   SARS Coronavirus 2 by RT PCR NEGATIVE NEGATIVE Final    Comment: (NOTE) SARS-CoV-2 target nucleic acids are NOT DETECTED.  The SARS-CoV-2 RNA is generally detectable in upper respiratory specimens during the acute phase of infection. The lowest concentration of SARS-CoV-2 viral copies this assay can detect is 138 copies/mL. A negative result does not preclude SARS-Cov-2 infection and should not be used as the sole basis for treatment or other patient management decisions. A negative result may occur with  improper specimen collection/handling, submission of specimen other than nasopharyngeal swab, presence of viral mutation(s) within the areas targeted by this assay, and inadequate number of viral copies(<138 copies/mL). A negative result must be combined with clinical observations, patient history, and epidemiological information. The expected result is Negative.  Fact Sheet for Patients:  EntrepreneurPulse.com.au  Fact Sheet for Healthcare Providers:  IncredibleEmployment.be  This test is no t yet approved or cleared by the [place] FDA and  has been authorized for detection and/or diagnosis of SARS-CoV-2 by FDA under an Emergency Use Authorization (EUA). This EUA will remain  in effect (meaning this test can be used) for the duration of the COVID-19 declaration under  Section 564(b)(1) of the Act, 21 U.S.C.section 360bbb-3(b)(1), unless the authorization is terminated  or revoked sooner.       Influenza A by PCR NEGATIVE NEGATIVE Final   Influenza B by PCR NEGATIVE NEGATIVE Final    Comment: (  NOTE) The Xpert Xpress SARS-CoV-2/FLU/RSV plus assay is intended as an aid in the diagnosis of influenza from Nasopharyngeal swab specimens and should not be used as a sole basis for treatment. Nasal washings and aspirates are unacceptable for Xpert Xpress SARS-CoV-2/FLU/RSV testing.  Fact Sheet for Patients: EntrepreneurPulse.com.au  Fact Sheet for Healthcare Providers: IncredibleEmployment.be  This test is not yet approved or cleared by the [place] FDA and has been authorized for detection and/or diagnosis of SARS-CoV-2 by FDA under an Emergency Use Authorization (EUA). This EUA will remain in effect (meaning this test can be used) for the duration of the COVID-19 declaration under Section 564(b)(1) of the Act, 21 U.S.C. section 360bbb-3(b)(1), unless the authorization is terminated or revoked.  Performed at  [facility], [address], [place], [place] [address]   MRSA PCR Screening     Status: None   Collection Time: 03/14/20  2:01 PM   Specimen: Nasal Mucosa; Nasopharyngeal  Result Value Ref Range Status   MRSA by PCR NEGATIVE NEGATIVE Final    Comment:        The GeneXpert MRSA Assay (FDA approved for NASAL specimens only), is one component of a comprehensive MRSA colonization surveillance program. It is not intended to diagnose MRSA infection nor to guide or monitor treatment for MRSA infections. Performed at [facility], [address] [place], [place] [address]   Culture, blood (routine x 2)     Status: None (Preliminary result)   Collection Time: 03/14/20  2:05 PM   Specimen: BLOOD  Result Value Ref Range Status   Specimen Description BLOOD LEFT ANTECUBITAL  Final   Special Requests    Final    Blood Culture adequate volume BOTTLES DRAWN AEROBIC AND ANAEROBIC   Culture   Final    NO GROWTH < 24 HOURS Performed at [facility], [address], [place], [place] [address]    Report Status PENDING  Incomplete  Culture, blood (routine x 2)     Status: None (Preliminary result)   Collection Time: 03/14/20  2:09 PM   Specimen: BLOOD  Result Value Ref Range Status   Specimen Description BLOOD BLOOD LEFT HAND  Final   Special Requests   Final    Blood Culture adequate volume BOTTLES DRAWN AEROBIC AND ANAEROBIC   Culture   Final    NO GROWTH < 24 HOURS Performed at [facility], [address], [place],  [address]    Report Status PENDING  Incomplete         Radiology Studies: CT PELVIS W CONTRAST  Result Date: 03/13/2020 CLINICAL DATA:  65 year old female with decubitus sacral ulcer. EXAM: CT PELVIS WITH CONTRAST TECHNIQUE: Multidetector CT imaging of the pelvis was performed using the standard protocol following the bolus administration of intravenous contrast. CONTRAST:  31mL OMNIPAQUE IOHEXOL 300 MG/ML  SOLN COMPARISON:  None. FINDINGS: Urinary Tract:  The urinary bladder is unremarkable. Bowel: No bowel dilatation. Mildly thickened appendix measuring approximately 11 mm in thickness. There is mild haziness of the peripancreatic Cl fat. Clinical correlation is recommended. Vascular/Lymphatic: Advanced aortoiliac atherosclerotic disease. No adenopathy. Reproductive: The uterus is retroverted and grossly unremarkable. No adnexal masses. Mildly dilated pelvic vasculature. Other: There is inflammatory changes and induration of the superficial soft tissues posterior and inferior to the distal sacrum/coccyx and to the right of the midline extending inferiorly along the intergluteal cleft. An ill-defined low attenuating area in the superficial soft tissues of the right buttock measures 5.4 x 4.5 cm (coronal 56/5). This most likely  represents a phlegmonous tissue. No drainable  fluid collection noted. Musculoskeletal: Osteopenia. No acute osseous pathology. No bone erosion or evidence of acute osteomyelitis. IMPRESSION: 1. Phlegmonous changes of the soft tissues posterior and inferior to the sacrum and to the right of the midline. No drainable fluid collection. No CT findings of acute osteomyelitis. 2. Mildly thickened appendix with mild haziness of the peripancreatic Cl fat. Clinical correlation is recommended to exclude early acute appendicitis. 3. Aortic Atherosclerosis (ICD10-I70.0). Electronically Signed   By: Anner Crete M.D.   On: 03/13/2020 17:23   DG Chest Port 1 View  Result Date: 03/13/2020 CLINICAL DATA:  Sepsis. EXAM: PORTABLE CHEST 1 VIEW COMPARISON:  January 28, 2009. FINDINGS: The heart size and mediastinal contours are within normal limits. Both lungs are clear. The visualized skeletal structures are unremarkable. IMPRESSION: No active disease. Electronically Signed   By: Marijo Conception M.D.   On: 03/13/2020 11:29        Scheduled Meds: . atorvastatin  20 mg Oral Daily  . baclofen  10 mg Oral TID  . celecoxib  200 mg Oral BID  . collagenase   Topical Daily  . gabapentin  600 mg Oral TID   Continuous Infusions: . sodium chloride 1,000 mL (03/13/20 1701)  . magnesium sulfate bolus IVPB    . vancomycin 750 mg (03/14/20 1624)     LOS: 2 days    Time spent: 35 minutes    Kila Godina D Manuella Ghazi, DO [facility]  If 7PM-7AM, please contact night-coverage [email or web address] 03/15/2020, 11:14 AM

## 2020-03-16 DIAGNOSIS — K3580 Unspecified acute appendicitis: Secondary | ICD-10-CM | POA: Diagnosis not present

## 2020-03-16 DIAGNOSIS — L89310 Pressure ulcer of right buttock, unstageable: Secondary | ICD-10-CM | POA: Diagnosis not present

## 2020-03-16 LAB — MAGNESIUM: Magnesium: 1.6 mg/dL — ABNORMAL LOW (ref 1.7–2.4)

## 2020-03-16 LAB — CULTURE, BLOOD (SINGLE)

## 2020-03-16 LAB — BASIC METABOLIC PANEL
Anion gap: 7 (ref 5–15)
BUN: 6 mg/dL — ABNORMAL LOW (ref 8–23)
CO2: 23 mmol/L (ref 22–32)
Calcium: 7.5 mg/dL — ABNORMAL LOW (ref 8.9–10.3)
Chloride: 104 mmol/L (ref 98–111)
Creatinine, Ser: 0.34 mg/dL — ABNORMAL LOW (ref 0.44–1.00)
GFR, Estimated: >60 mL/min (ref >60)
Glucose, Bld: 98 mg/dL (ref 70–99)
Potassium: 2.9 mmol/L — ABNORMAL LOW (ref 3.5–5.1)
Sodium: 134 mmol/L — ABNORMAL LOW (ref 135–145)

## 2020-03-16 LAB — CBC
HCT: 32.5 % — ABNORMAL LOW (ref 36.0–46.0)
Hemoglobin: 11.1 g/dL — ABNORMAL LOW (ref 12.0–15.0)
MCH: 33 pg (ref 26.0–34.0)
MCHC: 34.2 g/dL (ref 30.0–36.0)
MCV: 96.7 fL (ref 80.0–100.0)
Platelets: 288 10*3/uL (ref 150–400)
RBC: 3.36 MIL/uL — ABNORMAL LOW (ref 3.87–5.11)
RDW: 13 % (ref 11.5–15.5)
WBC: 15.1 10*3/uL — ABNORMAL HIGH (ref 4.0–10.5)
nRBC: 0 % (ref 0.0–0.2)

## 2020-03-16 MED ORDER — POTASSIUM CHLORIDE 10 MEQ/100ML IV SOLN
10.0000 meq | INTRAVENOUS | Status: AC
  Administered 2020-03-16 (×4): 10 meq via INTRAVENOUS
  Filled 2020-03-16 (×4): qty 100

## 2020-03-16 MED ORDER — POTASSIUM CHLORIDE CRYS ER 20 MEQ PO TBCR
40.0000 meq | EXTENDED_RELEASE_TABLET | Freq: Once | ORAL | Status: AC
  Administered 2020-03-16: 40 meq via ORAL
  Filled 2020-03-16: qty 2

## 2020-03-16 MED ORDER — BISACODYL 10 MG RE SUPP
10.0000 mg | Freq: Once | RECTAL | Status: AC
  Administered 2020-03-16: 10 mg via RECTAL
  Filled 2020-03-16: qty 1

## 2020-03-16 MED ORDER — PROPRANOLOL HCL 20 MG PO TABS
20.0000 mg | ORAL_TABLET | Freq: Two times a day (BID) | ORAL | Status: DC
  Administered 2020-03-16 – 2020-03-28 (×25): 20 mg via ORAL
  Filled 2020-03-16 (×25): qty 1

## 2020-03-16 MED ORDER — MAGNESIUM SULFATE 2 GM/50ML IV SOLN
2.0000 g | Freq: Once | INTRAVENOUS | Status: AC
  Administered 2020-03-16: 2 g via INTRAVENOUS
  Filled 2020-03-16: qty 50

## 2020-03-16 NOTE — Progress Notes (Signed)
Pharmacy Antibiotic Note  Faith [name] is a 65 y.o. female admitted on 03/13/2020 with bacteremia and intra abdominal infection.  Pharmacy has been consulted for Vancomycin dosing.  Plan: Vancomycin 750 mg IV every 24 hours. Expected AUC 527. Monitor labs, c/s, and further antibiotics.  Height: 5\' 4"  (162.6 cm) Weight: 44 kg (97 lb) IBW/kg (Calculated) : 54.7  Temp (24hrs), Avg:98.3 F (36.8 C), Min:98.1 F (36.7 C), Max:98.4 F (36.9 C)  Recent Labs  Lab 03/13/20 1020 03/13/20 1122 03/13/20 1349 03/14/20 0544 03/15/20 0616 03/16/20 0500  WBC 32.8*  --   --  21.6* 13.6* 15.1*  CREATININE 0.64  --   --  0.41* 0.39* 0.34*  LATICACIDVEN  --  1.6 1.5  --   --   --     Estimated Creatinine Clearance: 50 mL/min (A) (by C-G formula based on SCr of 0.34 mg/dL (L)).    Allergies  Allergen Reactions  . Darvon [Propoxyphene] Other (See Comments)    Hallicuations  . Oxycodone-Acetaminophen     Does not have any affect on her  . Penicillins Rash    Did it involve swelling of the face/tongue/throat, SOB, or low BP? Unknown Did it involve sudden or severe rash/hives, skin peeling, or any reaction on the inside of your mouth or nose? Unknown Did you need to seek medical attention at a hospital or doctor's office? Unknown When did it last happen?Unknown If all above answers are "NO", may proceed with cephalosporin use.     Antimicrobials this admission: Vanco 1/13 >>  Cefepime 1/13 x 1 Flagyl 1/13>> cipro 1/15 >>  Microbiology results: 1/13 BCx: MRSA 1/13 UCx: multiple species suggest recollection 1/13 Covid/flu: negative    Thank you for allowing pharmacy to be a part of this patient's care.  Margot Ables, PharmD Clinical Pharmacist 03/16/2020 10:39 AM

## 2020-03-16 NOTE — Progress Notes (Signed)
PROGRESS NOTE    LESHLY MANN  [id number] DOB: [birth year] DOA: 03/13/2020 PCP: Doree Albee, MD   Brief Narrative:  [name] a 65 y.o.femalewith medical history significant formultiple sclerosis, COPD, pressure ulcer, hypertension. Patient was brought to the ED via EMS with complaints of fall, and family not being able to take care of her.Patient was admitted with SIRS criteria and decubitus ulcer with concern for need for debridement. She has had some worsening weakness and falls. She is now noted to have MRSA bacteremia. She has been started on ciprofloxacin and Flagyl for empiric treatment of appendicitis per GS.  Assessment & Plan:   Principal Problem:   Decubital ulcer Active Problems:   Falls frequently   Multiple sclerosis (HCC)   SIRS (systemic inflammatory response syndrome) (HCC)   Sacral decubitus ulcer, stage II (HCC)   Acute appendicitis   MRSA bacteremia -Decubitus ulcer noted, but no obvious source -Continue vancomycin  -Repeat blood cultures negative -2D echocardiogram TTE performed without any signs of vegetations.  LVEF 60 to 65% -Consult to cardiology for a.m. for TEE and patient will be kept n.p.o. after midnight -No new back pain  Right buttock decubitus ulcer -Appears to be mostly stage II with no need for debridement per general surgery -Continue diet -IV morphine as needed for pain control  Questionable appendicitis -Without significant abdominal pain/N/V at this time; no need for surgical intervention -Cipro and Flagyl started per GS -Serial abdominal exams per GS  Multiple sclerosis -Multiple falls which may be related -PT evaluation recommending SNF which patient is agreeable to -Continue home pain medications  Hypomagnesemia/hypokalemia -Replete andrecheck in am  Hypertension -Propranolol can be restarted today  DVT prophylaxis:SCDs Code Status:Full Family Communication:Discussed with daughter at bedside  on 1/16 Disposition Plan: Status is: Inpatient  Remains inpatient appropriate because:IV treatments appropriate due to intensity of illness or inability to take PO and Inpatient level of care appropriate due to severity of illness   Dispo: The patient is from:Home Anticipated d/c is to:SNF Anticipated d/c date is: 1-2 days Patient currently is not medically stable to d/c.Patient needs further evaluation of MRSA bacteremia. TEE pending.   Consultants:  General surgery  Cardiology for AM  Procedures:  See below  Antimicrobials:  Anti-infectives (From admission, onward)   Start     Dose/Rate Route Frequency Ordered Stop   03/15/20 1300  ciprofloxacin (CIPRO) IVPB 400 mg        400 mg 200 mL/hr over 60 Minutes Intravenous Every 12 hours 03/15/20 1212     03/15/20 1300  metroNIDAZOLE (FLAGYL) IVPB 500 mg        500 mg 100 mL/hr over 60 Minutes Intravenous Every 8 hours 03/15/20 1212     03/14/20 1445  vancomycin (VANCOREADY) IVPB 750 mg/150 mL        750 mg 150 mL/hr over 60 Minutes Intravenous Every 24 hours 03/14/20 1358     03/14/20 1200  vancomycin (VANCOREADY) IVPB 750 mg/150 mL  Status:  Discontinued        750 mg 150 mL/hr over 60 Minutes Intravenous Every 24 hours 03/13/20 1253 03/13/20 1803   03/13/20 2300  cefTRIAXone (ROCEPHIN) 1 g in sodium chloride 0.9 % 100 mL IVPB  Status:  Discontinued        1 g 200 mL/hr over 30 Minutes Intravenous Every 24 hours 03/13/20 1803 03/14/20 1516   03/13/20 1145  ceFEPIme (MAXIPIME) 2 g in sodium chloride 0.9 % 100 mL IVPB  2 g 200 mL/hr over 30 Minutes Intravenous  Once 03/13/20 1130 03/13/20 1313   03/13/20 1145  metroNIDAZOLE (FLAGYL) IVPB 500 mg        500 mg 100 mL/hr over 60 Minutes Intravenous  Once 03/13/20 1130 03/13/20 1311   03/13/20 1115  levofloxacin (LEVAQUIN) IVPB 750 mg  Status:  Discontinued        750 mg 100 mL/hr over 90 Minutes Intravenous  Once  03/13/20 1109 03/13/20 1127   03/13/20 1115  aztreonam (AZACTAM) 2 g in sodium chloride 0.9 % 100 mL IVPB  Status:  Discontinued        2 g 200 mL/hr over 30 Minutes Intravenous  Once 03/13/20 1109 03/13/20 1132   03/13/20 1115  vancomycin (VANCOCIN) IVPB 1000 mg/200 mL premix        1,000 mg 200 mL/hr over 60 Minutes Intravenous  Once 03/13/20 1109 03/13/20 1307       Subjective: Patient seen and evaluated today with no new acute complaints or concerns. No acute concerns or events noted overnight.  She is eating well without any abdominal pain, nausea, vomiting.  Objective: Vitals:   03/15/20 0533 03/15/20 1512 03/15/20 2137 03/16/20 0439  BP: 121/81 124/81 (!) 151/92 136/86  Pulse: 80 83 (!) 108 96  Resp: 16 16 18 18   Temp: 98.3 F (36.8 C) 98.1 F (36.7 C) 98.4 F (36.9 C) 98.3 F (36.8 C)  TempSrc: Oral Oral  Oral  SpO2: 99% 100% 94% 95%  Weight:      Height:        Intake/Output Summary (Last 24 hours) at 03/16/2020 1145 Last data filed at 03/16/2020 0900 Gross per 24 hour  Intake 1029.8 ml  Output 300 ml  Net 729.8 ml   Filed Weights   03/13/20 1000 03/13/20 1755  Weight: 45.8 kg 44 kg    Examination:  General exam: Appears calm and comfortable  Respiratory system: Clear to auscultation. Respiratory effort normal. Cardiovascular system: S1 & S2 heard, RRR.  Gastrointestinal system: Abdomen is soft Central nervous system: Alert and awake Extremities: No edema Skin: No significant lesions noted Psychiatry: Flat affect.    Data Reviewed: I have personally reviewed following labs and imaging studies  CBC: Recent Labs  Lab 03/13/20 1020 03/14/20 0544 03/15/20 0616 03/16/20 0500  WBC 32.8* 21.6* 13.6* 15.1*  NEUTROABS 29.3*  --   --   --   HGB 13.5 10.7* 10.3* 11.1*  HCT 38.7 31.7* 31.0* 32.5*  MCV 96.8 97.8 100.0 96.7  PLT 283 267 279 [id number]   Basic Metabolic Panel: Recent Labs  Lab 03/13/20 1020 03/14/20 0544 03/15/20 0616 03/16/20 0500  NA  133* 137 137 134*  K 3.2* 3.0* 3.6 2.9*  CL 98 108 107 104  CO2 24 21* 25 23  GLUCOSE 141* 84 97 98  BUN 11 10 13  6*  CREATININE 0.64 0.41* 0.39* 0.34*  CALCIUM 8.4* 7.7* 7.8* 7.5*  MG  --  1.7 1.6* 1.6*   GFR: Estimated Creatinine Clearance: 50 mL/min (A) (by C-G formula based on SCr of 0.34 mg/dL (L)). Liver Function Tests: Recent Labs  Lab 03/13/20 1020  AST 19  ALT 21  ALKPHOS 78  BILITOT 1.5*  PROT 6.2*  ALBUMIN 3.0*   No results for input(s): LIPASE, AMYLASE in the last 168 hours. No results for input(s): AMMONIA in the last 168 hours. Coagulation Profile: Recent Labs  Lab 03/13/20 1020  INR 1.1   Cardiac Enzymes: No results for input(s): CKTOTAL,  CKMB, CKMBINDEX, TROPONINI in the last 168 hours. BNP (last 3 results) No results for input(s): PROBNP in the last 8760 hours. HbA1C: No results for input(s): HGBA1C in the last 72 hours. CBG: No results for input(s): GLUCAP in the last 168 hours. Lipid Profile: No results for input(s): CHOL, HDL, LDLCALC, TRIG, CHOLHDL, LDLDIRECT in the last 72 hours. Thyroid Function Tests: No results for input(s): TSH, T4TOTAL, FREET4, T3FREE, THYROIDAB in the last 72 hours. Anemia Panel: No results for input(s): VITAMINB12, FOLATE, FERRITIN, TIBC, IRON, RETICCTPCT in the last 72 hours. Sepsis Labs: Recent Labs  Lab 03/13/20 1122 03/13/20 1349  LATICACIDVEN 1.6 1.5    Recent Results (from the past 240 hour(s))  Urine culture     Status: Abnormal   Collection Time: 03/13/20 10:25 AM   Specimen: In/Out Cath Urine  Result Value Ref Range Status   Specimen Description   Final    IN/OUT CATH URINE Performed at [facility], [address] [place], [place] [address]    Special Requests   Final    NONE Performed at [facility], [address], [place], [place] [address]    Culture MULTIPLE SPECIES PRESENT, SUGGEST RECOLLECTION (A)  Final   Report Status 03/15/2020 FINAL  Final  SARS CORONAVIRUS 2 (TAT 6-24 HRS)  Nasopharyngeal Nasopharyngeal Swab     Status: None   Collection Time: 03/13/20 11:07 AM   Specimen: Nasopharyngeal Swab  Result Value Ref Range Status   SARS Coronavirus 2 NEGATIVE NEGATIVE Final    Comment: (NOTE) SARS-CoV-2 target nucleic acids are NOT DETECTED.  The SARS-CoV-2 RNA is generally detectable in upper and lower respiratory specimens during the acute phase of infection. Negative results do not preclude SARS-CoV-2 infection, do not rule out co-infections with other pathogens, and should not be used as the sole basis for treatment or other patient management decisions. Negative results must be combined with clinical observations, patient history, and epidemiological information. The expected result is Negative.  Fact Sheet for Patients: SugarRoll.be  Fact Sheet for Healthcare Providers: https://www.woods-mathews.com/  This test is not yet approved or cleared by the [place] FDA and  has been authorized for detection and/or diagnosis of SARS-CoV-2 by FDA under an Emergency Use Authorization (EUA). This EUA will remain  in effect (meaning this test can be used) for the duration of the COVID-19 declaration under Se ction 564(b)(1) of the Act, 21 U.S.C. section 360bbb-3(b)(1), unless the authorization is terminated or revoked sooner.  Performed at [facility], [place] [address], [place], [place] [address]   Blood culture (routine single)     Status: Abnormal   Collection Time: 03/13/20 11:22 AM   Specimen: BLOOD  Result Value Ref Range Status   Specimen Description   Final    BLOOD RIGHT ANTECUBITAL Performed at [facility], [address], [place], [place] [address]    Special Requests   Final    Blood Culture results may not be optimal due to an excessive volume of blood received in culture bottles BOTTLES DRAWN AEROBIC AND ANAEROBIC Performed at [facility], [address], [place], [place] [address]    Culture   Setup Time   Final    GRAM POSITIVE COCCI IN CLUSTERS AEROBIC BOTTLE ONLY Organism ID to follow CRITICAL RESULT CALLED TO, READ BACK BY AND VERIFIED WITHGloris [place] [id number] 03/14/20 A BROWNING Performed at [facility], [place] [address], [place],  [address]    Culture METHICILLIN RESISTANT STAPHYLOCOCCUS AUREUS (A)  Final   Report Status 03/16/2020 FINAL  Final   Organism ID, Bacteria METHICILLIN RESISTANT STAPHYLOCOCCUS AUREUS  Final      Susceptibility   Methicillin resistant staphylococcus aureus - MIC*    CIPROFLOXACIN >=8 RESISTANT Resistant     ERYTHROMYCIN <=0.25 SENSITIVE Sensitive     GENTAMICIN <=0.5 SENSITIVE Sensitive     OXACILLIN >=4 RESISTANT Resistant     TETRACYCLINE <=1 SENSITIVE Sensitive     VANCOMYCIN 1 SENSITIVE Sensitive     TRIMETH/SULFA <=10 SENSITIVE Sensitive     CLINDAMYCIN <=0.25 SENSITIVE Sensitive     RIFAMPIN <=0.5 SENSITIVE Sensitive     Inducible Clindamycin NEGATIVE Sensitive     * METHICILLIN RESISTANT STAPHYLOCOCCUS AUREUS  Blood Culture ID Panel (Reflexed)     Status: Abnormal   Collection Time: 03/13/20 11:22 AM  Result Value Ref Range Status   Enterococcus faecalis NOT DETECTED NOT DETECTED Final   Enterococcus Faecium NOT DETECTED NOT DETECTED Final   Listeria monocytogenes NOT DETECTED NOT DETECTED Final   Staphylococcus species DETECTED (A) NOT DETECTED Final    Comment: CRITICAL RESULT CALLED TO, READ BACK BY AND VERIFIED WITHGloris [place] PHARMD 1425 03/14/20 A BROWNING    Staphylococcus aureus (BCID) DETECTED (A) NOT DETECTED Final    Comment: Methicillin (oxacillin)-resistant Staphylococcus aureus (MRSA). MRSA is predictably resistant to beta-lactam antibiotics (except ceftaroline). Preferred therapy is vancomycin unless clinically contraindicated. Patient requires contact precautions if  hospitalized. CRITICAL RESULT CALLED TO, READ BACK BY AND VERIFIED WITH: Gloris [place] PHARMD 1425 03/14/20 A BROWNING    Staphylococcus  epidermidis NOT DETECTED NOT DETECTED Final   Staphylococcus lugdunensis NOT DETECTED NOT DETECTED Final   Streptococcus species NOT DETECTED NOT DETECTED Final   Streptococcus agalactiae NOT DETECTED NOT DETECTED Final   Streptococcus pneumoniae NOT DETECTED NOT DETECTED Final   Streptococcus pyogenes NOT DETECTED NOT DETECTED Final   A.calcoaceticus-baumannii NOT DETECTED NOT DETECTED Final   Bacteroides fragilis NOT DETECTED NOT DETECTED Final   Enterobacterales NOT DETECTED NOT DETECTED Final   Enterobacter cloacae complex NOT DETECTED NOT DETECTED Final   Escherichia coli NOT DETECTED NOT DETECTED Final   Klebsiella aerogenes NOT DETECTED NOT DETECTED Final   Klebsiella oxytoca NOT DETECTED NOT DETECTED Final   Klebsiella pneumoniae NOT DETECTED NOT DETECTED Final   Proteus species NOT DETECTED NOT DETECTED Final   Salmonella species NOT DETECTED NOT DETECTED Final   Serratia marcescens NOT DETECTED NOT DETECTED Final   Haemophilus influenzae NOT DETECTED NOT DETECTED Final   Neisseria meningitidis NOT DETECTED NOT DETECTED Final   Pseudomonas aeruginosa NOT DETECTED NOT DETECTED Final   Stenotrophomonas maltophilia NOT DETECTED NOT DETECTED Final   Candida albicans NOT DETECTED NOT DETECTED Final   Candida auris NOT DETECTED NOT DETECTED Final   Candida glabrata NOT DETECTED NOT DETECTED Final   Candida krusei NOT DETECTED NOT DETECTED Final   Candida parapsilosis NOT DETECTED NOT DETECTED Final   Candida tropicalis NOT DETECTED NOT DETECTED Final   Cryptococcus neoformans/gattii NOT DETECTED NOT DETECTED Final   Meth resistant mecA/C and MREJ DETECTED (A) NOT DETECTED Final    Comment: CRITICAL RESULT CALLED TO, READ BACK BY AND VERIFIED WITHGloris [place] PHARMD 1425 03/14/20 A BROWNING Performed at [facility], [address], [place], [place] [address]   Resp Panel by RT-PCR (Flu A&B, Covid) Nasopharyngeal Swab     Status: None   Collection Time: 03/13/20  2:24 PM    Specimen: Nasopharyngeal Swab; Nasopharyngeal(NP) swabs in vial transport medium  Result Value Ref Range Status   SARS Coronavirus 2 by RT PCR NEGATIVE NEGATIVE Final    Comment: (NOTE) SARS-CoV-2 target nucleic acids are NOT DETECTED.  The SARS-CoV-2 RNA is generally detectable in upper respiratory specimens during the acute phase of infection. The lowest concentration of SARS-CoV-2 viral copies this assay can detect is 138 copies/mL. A negative result does not preclude SARS-Cov-2 infection and should not be used as the sole basis for treatment or other patient management decisions. A negative result may occur with  improper specimen collection/handling, submission of specimen other than nasopharyngeal swab, presence of viral mutation(s) within the areas targeted by this assay, and inadequate number of viral copies(<138 copies/mL). A negative result must be combined with clinical observations, patient history, and epidemiological information. The expected result is Negative.  Fact Sheet for Patients:  BloggerCourse.com  Fact Sheet for Healthcare Providers:  SeriousBroker.it  This test is no t yet approved or cleared by the [place] FDA and  has been authorized for detection and/or diagnosis of SARS-CoV-2 by FDA under an Emergency Use Authorization (EUA). This EUA will remain  in effect (meaning this test can be used) for the duration of the COVID-19 declaration under Section 564(b)(1) of the Act, 21 U.S.C.section 360bbb-3(b)(1), unless the authorization is terminated  or revoked sooner.       Influenza A by PCR NEGATIVE NEGATIVE Final   Influenza B by PCR NEGATIVE NEGATIVE Final    Comment: (NOTE) The Xpert Xpress SARS-CoV-2/FLU/RSV plus assay is intended as an aid in the diagnosis of influenza from Nasopharyngeal swab specimens and should not be used as a sole basis for treatment. Nasal washings and aspirates are  unacceptable for Xpert Xpress SARS-CoV-2/FLU/RSV testing.  Fact Sheet for Patients: BloggerCourse.com  Fact Sheet for Healthcare Providers: SeriousBroker.it  This test is not yet approved or cleared by the [place] FDA and has been authorized for detection and/or diagnosis of SARS-CoV-2 by FDA under an Emergency Use Authorization (EUA). This EUA will remain in effect (meaning this test can be used) for the duration of the COVID-19 declaration under Section 564(b)(1) of the Act, 21 U.S.C. section 360bbb-3(b)(1), unless the authorization is terminated or revoked.  Performed at [facility], [address], [place], [place] [address]   MRSA PCR Screening     Status: None   Collection Time: 03/14/20  2:01 PM   Specimen: Nasal Mucosa; Nasopharyngeal  Result Value Ref Range Status   MRSA by PCR NEGATIVE NEGATIVE Final    Comment:        The GeneXpert MRSA Assay (FDA approved for NASAL specimens only), is one component of a comprehensive MRSA colonization surveillance program. It is not intended to diagnose MRSA infection nor to guide or monitor treatment for MRSA infections. Performed at [facility], [address], [place], [place] [address]   Culture, blood (routine x 2)     Status: None (Preliminary result)   Collection Time: 03/14/20  2:05 PM   Specimen: BLOOD  Result Value Ref Range Status   Specimen Description BLOOD LEFT ANTECUBITAL  Final   Special Requests   Final    Blood Culture adequate volume BOTTLES DRAWN AEROBIC AND ANAEROBIC   Culture   Final    NO GROWTH 2 DAYS Performed at [facility], [address], [place], [place] [address]    Report Status PENDING  Incomplete  Culture, blood (routine x 2)     Status: None (Preliminary result)   Collection Time: 03/14/20  2:09 PM  Specimen: BLOOD  Result Value Ref Range Status   Specimen Description BLOOD BLOOD LEFT HAND  Final   Special Requests   Final     Blood Culture adequate volume BOTTLES DRAWN AEROBIC AND ANAEROBIC   Culture   Final    NO GROWTH 2 DAYS Performed at [facility], [address] [place], [place] [address]    Report Status PENDING  Incomplete         Radiology Studies: ECHOCARDIOGRAM COMPLETE  Result Date: 03/15/2020    ECHOCARDIOGRAM REPORT   Patient Name:   Jackelyn Knife Date of Exam: 03/15/2020 Medical Rec #:  [id number]    Height:       64.0 in Accession #:    [id number]   Weight:       97.0 lb Date of Birth:  September 08, 1955    BSA:          1.438 m Patient Age:    42 years     BP:           121/81 mmHg Patient Gender: F            HR:           85 bpm. Exam Location:  [facility] Procedure: 2D Echo, Cardiac Doppler and Color Doppler Indications:    Bacteremia  History:        Patient has no prior history of Echocardiogram examinations.  Sonographer:    Merrie Roof Referring Phys: [id number] [place]  1. Left ventricular ejection fraction, by estimation, is 60 to 65%. The left ventricle has normal function. The left ventricle has no regional wall motion abnormalities. Left ventricular diastolic parameters were normal.  2. Right ventricular systolic function is normal. The right ventricular size is normal. There is mildly elevated pulmonary artery systolic pressure.  3. The mitral valve is normal in structure. Trivial mitral valve regurgitation. No evidence of mitral stenosis.  4. The aortic valve is tricuspid. Aortic valve regurgitation is not visualized. Mild aortic valve sclerosis is present, with no evidence of aortic valve stenosis.  5. The inferior vena cava is normal in size with greater than 50% respiratory variability, suggesting right atrial pressure of 3 mmHg. FINDINGS  Left Ventricle: Left ventricular ejection fraction, by estimation, is 60 to 65%. The left ventricle has normal function. The left ventricle has no regional wall motion abnormalities. The left ventricular internal cavity size was normal in size.  There is  no left ventricular hypertrophy. Left ventricular diastolic parameters were normal. Right Ventricle: The right ventricular size is normal. Right ventricular systolic function is normal. There is mildly elevated pulmonary artery systolic pressure. The tricuspid regurgitant velocity is 3.06 m/s, and with an assumed right atrial pressure of 3 mmHg, the estimated right ventricular systolic pressure is [id number] mmHg. Left Atrium: Left atrial size was normal in size. Right Atrium: Right atrial size was normal in size. Pericardium: There is no evidence of pericardial effusion. Mitral Valve: The mitral valve is normal in structure. Mild mitral annular calcification. Trivial mitral valve regurgitation. No evidence of mitral valve stenosis. Tricuspid Valve: The tricuspid valve is normal in structure. Tricuspid valve regurgitation is mild . No evidence of tricuspid stenosis. Aortic Valve: The aortic valve is tricuspid. Aortic valve regurgitation is not visualized. Mild aortic valve sclerosis is present, with no evidence of aortic valve stenosis. Pulmonic Valve: The pulmonic valve was grossly normal. Pulmonic valve regurgitation is not visualized. No evidence of pulmonic stenosis. Aorta: The aortic root is  normal in size and structure. Venous: The inferior vena cava is normal in size with greater than 50% respiratory variability, suggesting right atrial pressure of 3 mmHg. IAS/Shunts: No atrial level shunt detected by color flow Doppler.  LEFT VENTRICLE PLAX 2D LVIDd:         4.21 cm     Diastology LVIDs:         3.27 cm     LV e' medial:    8.70 cm/s LV PW:         1.01 cm     LV E/e' medial:  8.6 LV IVS:        1.02 cm     LV e' lateral:   12.90 cm/s                            LV E/e' lateral: 5.8  LV Volumes (MOD) LV vol d, MOD A4C: 62.5 ml LV vol s, MOD A4C: 27.4 ml LV SV MOD A4C:     62.5 ml RIGHT VENTRICLE RV Basal diam:  4.04 cm RV Mid diam:    3.26 cm LEFT ATRIUM           Index       RIGHT ATRIUM           Index  LA diam:      3.10 cm 2.16 cm/m  RA Area:     16.30 cm LA Vol (A2C): 45.5 ml 31.63 ml/m RA Volume:   40.60 ml  28.22 ml/m  AORTIC VALVE LVOT Vmax:   106.00 cm/s LVOT Vmean:  65.600 cm/s LVOT VTI:    0.202 m  AORTA Ao Root diam: 3.30 cm Ao Asc diam:  2.55 cm MITRAL VALVE               TRICUSPID VALVE MV Area (PHT): 4.54 cm    TR Peak grad:   37.5 mmHg MV Decel Time: 167 msec    TR Vmax:        306.00 cm/s MV E velocity: 75.00 cm/s MV A velocity: 62.60 cm/s  SHUNTS MV E/A ratio:  1.20        Systemic VTI: 0.20 m Kirk Ruths MD Electronically signed by Kirk Ruths MD Signature Date/Time: 03/15/2020/2:59:38 PM    Final         Scheduled Meds: . atorvastatin  20 mg Oral Daily  . baclofen  10 mg Oral TID  . celecoxib  200 mg Oral BID  . collagenase   Topical Daily  . gabapentin  600 mg Oral TID  . potassium chloride  40 mEq Oral Once   Continuous Infusions: . ciprofloxacin 400 mg (03/16/20 0255)  . magnesium sulfate bolus IVPB    . metronidazole 500 mg (03/16/20 0612)  . potassium chloride    . vancomycin 750 mg (03/15/20 1603)     LOS: 3 days    Time spent: 35 minutes    Rory Montel D Manuella Ghazi, DO [facility]  If 7PM-7AM, please contact night-coverage [email or web address] 03/16/2020, 11:45 AM

## 2020-03-16 NOTE — Progress Notes (Signed)
CC: ? appendicitis Subjective: Doing ok, had supper last night, feels contipated. Some lower LLQ abd pain. Afebrile Wbc slight bump  Objective: Vital signs in last 24 hours: Temp:  [98.1 F (36.7 C)-98.4 F (36.9 C)] 98.3 F (36.8 C) (01/16 0439) Pulse Rate:  [83-108] 96 (01/16 0439) Resp:  [16-18] 18 (01/16 0439) BP: (124-151)/(81-92) 136/86 (01/16 0439) SpO2:  [94 %-100 %] 95 % (01/16 0439) Last BM Date: 03/12/20  Intake/Output from previous day: 01/15 0701 - 01/16 0700 In: 549.8 [IV Piggyback:549.8] Out: 300 [Urine:300] Intake/Output this shift: Total I/O In: 480 [P.O.:480] Out: -   Physical exam: NAD Abd: soft, non tender, she states it feels sore diffusely. NO peritonitis.  Lab Results: CBC  Recent Labs    03/15/20 0616 03/16/20 0500  WBC 13.6* 15.1*  HGB 10.3* 11.1*  HCT 31.0* 32.5*  PLT 279 288   BMET Recent Labs    03/15/20 0616 03/16/20 0500  NA 137 134*  K 3.6 2.9*  CL 107 104  CO2 25 23  GLUCOSE 97 98  BUN 13 6*  CREATININE 0.39* 0.34*  CALCIUM 7.8* 7.5*   PT/INR No results for input(s): LABPROT, INR in the last 72 hours. ABG No results for input(s): PHART, HCO3 in the last 72 hours.  Invalid input(s): PCO2, PO2  Studies/Results: ECHOCARDIOGRAM COMPLETE  Result Date: 03/15/2020    ECHOCARDIOGRAM REPORT   Patient Name:   Faith [name] Date of Exam: 03/15/2020 Medical Rec #:  [id number]    Height:       64.0 in Accession #:    [id number]   Weight:       97.0 lb Date of Birth:  1955/05/14    BSA:          1.438 m Patient Age:    65 years     BP:           121/81 mmHg Patient Gender: F            HR:           85 bpm. Exam Location:  [facility] Procedure: 2D Echo, Cardiac Doppler and Color Doppler Indications:    Bacteremia  History:        Patient has no prior history of Echocardiogram examinations.  Sonographer:    Merrie Roof Referring Phys: [id number] [place]  1. Left ventricular ejection fraction, by estimation, is 60 to 65%.  The left ventricle has normal function. The left ventricle has no regional wall motion abnormalities. Left ventricular diastolic parameters were normal.  2. Right ventricular systolic function is normal. The right ventricular size is normal. There is mildly elevated pulmonary artery systolic pressure.  3. The mitral valve is normal in structure. Trivial mitral valve regurgitation. No evidence of mitral stenosis.  4. The aortic valve is tricuspid. Aortic valve regurgitation is not visualized. Mild aortic valve sclerosis is present, with no evidence of aortic valve stenosis.  5. The inferior vena cava is normal in size with greater than 50% respiratory variability, suggesting right atrial pressure of 3 mmHg. FINDINGS  Left Ventricle: Left ventricular ejection fraction, by estimation, is 60 to 65%. The left ventricle has normal function. The left ventricle has no regional wall motion abnormalities. The left ventricular internal cavity size was normal in size. There is  no left ventricular hypertrophy. Left ventricular diastolic parameters were normal. Right Ventricle: The right ventricular size is normal. Right ventricular systolic function is normal. There is mildly elevated pulmonary artery systolic  pressure. The tricuspid regurgitant velocity is 3.06 m/s, and with an assumed right atrial pressure of 3 mmHg, the estimated right ventricular systolic pressure is [id number] mmHg. Left Atrium: Left atrial size was normal in size. Right Atrium: Right atrial size was normal in size. Pericardium: There is no evidence of pericardial effusion. Mitral Valve: The mitral valve is normal in structure. Mild mitral annular calcification. Trivial mitral valve regurgitation. No evidence of mitral valve stenosis. Tricuspid Valve: The tricuspid valve is normal in structure. Tricuspid valve regurgitation is mild . No evidence of tricuspid stenosis. Aortic Valve: The aortic valve is tricuspid. Aortic valve regurgitation is not visualized. Mild  aortic valve sclerosis is present, with no evidence of aortic valve stenosis. Pulmonic Valve: The pulmonic valve was grossly normal. Pulmonic valve regurgitation is not visualized. No evidence of pulmonic stenosis. Aorta: The aortic root is normal in size and structure. Venous: The inferior vena cava is normal in size with greater than 50% respiratory variability, suggesting right atrial pressure of 3 mmHg. IAS/Shunts: No atrial level shunt detected by color flow Doppler.  LEFT VENTRICLE PLAX 2D LVIDd:         4.21 cm     Diastology LVIDs:         3.27 cm     LV e' medial:    8.70 cm/s LV PW:         1.01 cm     LV E/e' medial:  8.6 LV IVS:        1.02 cm     LV e' lateral:   12.90 cm/s                            LV E/e' lateral: 5.8  LV Volumes (MOD) LV vol d, MOD A4C: 62.5 ml LV vol s, MOD A4C: 27.4 ml LV SV MOD A4C:     62.5 ml RIGHT VENTRICLE RV Basal diam:  4.04 cm RV Mid diam:    3.26 cm LEFT ATRIUM           Index       RIGHT ATRIUM           Index LA diam:      3.10 cm 2.16 cm/m  RA Area:     16.30 cm LA Vol (A2C): 45.5 ml 31.63 ml/m RA Volume:   40.60 ml  28.22 ml/m  AORTIC VALVE LVOT Vmax:   106.00 cm/s LVOT Vmean:  65.600 cm/s LVOT VTI:    0.202 m  AORTA Ao Root diam: 3.30 cm Ao Asc diam:  2.55 cm MITRAL VALVE               TRICUSPID VALVE MV Area (PHT): 4.54 cm    TR Peak grad:   37.5 mmHg MV Decel Time: 167 msec    TR Vmax:        306.00 cm/s MV E velocity: 75.00 cm/s MV A velocity: 62.60 cm/s  SHUNTS MV E/A ratio:  1.20        Systemic VTI: 0.20 m Kirk Ruths MD Electronically signed by Kirk Ruths MD Signature Date/Time: 03/15/2020/2:59:38 PM    Final     Anti-infectives: Anti-infectives (From admission, onward)   Start     Dose/Rate Route Frequency Ordered Stop   03/15/20 1300  ciprofloxacin (CIPRO) IVPB 400 mg        400 mg 200 mL/hr over 60 Minutes Intravenous Every 12 hours 03/15/20 1212  03/15/20 1300  metroNIDAZOLE (FLAGYL) IVPB 500 mg        500 mg 100 mL/hr over 60  Minutes Intravenous Every 8 hours 03/15/20 1212     03/14/20 1445  vancomycin (VANCOREADY) IVPB 750 mg/150 mL        750 mg 150 mL/hr over 60 Minutes Intravenous Every 24 hours 03/14/20 1358     03/14/20 1200  vancomycin (VANCOREADY) IVPB 750 mg/150 mL  Status:  Discontinued        750 mg 150 mL/hr over 60 Minutes Intravenous Every 24 hours 03/13/20 1253 03/13/20 1803   03/13/20 2300  cefTRIAXone (ROCEPHIN) 1 g in sodium chloride 0.9 % 100 mL IVPB  Status:  Discontinued        1 g 200 mL/hr over 30 Minutes Intravenous Every 24 hours 03/13/20 1803 03/14/20 1516   03/13/20 1145  ceFEPIme (MAXIPIME) 2 g in sodium chloride 0.9 % 100 mL IVPB        2 g 200 mL/hr over 30 Minutes Intravenous  Once 03/13/20 1130 03/13/20 1313   03/13/20 1145  metroNIDAZOLE (FLAGYL) IVPB 500 mg        500 mg 100 mL/hr over 60 Minutes Intravenous  Once 03/13/20 1130 03/13/20 1311   03/13/20 1115  levofloxacin (LEVAQUIN) IVPB 750 mg  Status:  Discontinued        750 mg 100 mL/hr over 90 Minutes Intravenous  Once 03/13/20 1109 03/13/20 1127   03/13/20 1115  aztreonam (AZACTAM) 2 g in sodium chloride 0.9 % 100 mL IVPB  Status:  Discontinued        2 g 200 mL/hr over 30 Minutes Intravenous  Once 03/13/20 1109 03/13/20 1132   03/13/20 1115  vancomycin (VANCOCIN) IVPB 1000 mg/200 mL premix        1,000 mg 200 mL/hr over 60 Minutes Intravenous  Once 03/13/20 1109 03/13/20 1307      Assessment/Plan:  Incidental appendicitis, responding to a/bs abd is benign but she endorses some intermittent pain Clinical picture not classical for appendicitis but we will continue to treat medically Not toxic and in no need for emergent surgical intervention We will continue to follow I spent > 25 min in this encounter w > 50% spent in coordination and counseling of her care  Caroleen Hamman, MD, FACS  03/16/2020

## 2020-03-17 ENCOUNTER — Encounter (HOSPITAL_COMMUNITY): Payer: Self-pay | Admitting: Internal Medicine

## 2020-03-17 ENCOUNTER — Inpatient Hospital Stay (HOSPITAL_COMMUNITY): Payer: Medicaid Other

## 2020-03-17 ENCOUNTER — Inpatient Hospital Stay (HOSPITAL_COMMUNITY): Payer: Medicaid Other | Admitting: Anesthesiology

## 2020-03-17 ENCOUNTER — Inpatient Hospital Stay: Payer: Self-pay

## 2020-03-17 ENCOUNTER — Encounter (HOSPITAL_COMMUNITY)
Admission: EM | Disposition: A | Discharge status: Home Health | Payer: Self-pay | Source: Home / Self Care | Attending: Family Medicine

## 2020-03-17 DIAGNOSIS — I340 Nonrheumatic mitral (valve) insufficiency: Secondary | ICD-10-CM | POA: Diagnosis not present

## 2020-03-17 DIAGNOSIS — I10 Essential (primary) hypertension: Secondary | ICD-10-CM | POA: Diagnosis not present

## 2020-03-17 DIAGNOSIS — I361 Nonrheumatic tricuspid (valve) insufficiency: Secondary | ICD-10-CM | POA: Diagnosis not present

## 2020-03-17 DIAGNOSIS — R7881 Bacteremia: Secondary | ICD-10-CM

## 2020-03-17 DIAGNOSIS — J449 Chronic obstructive pulmonary disease, unspecified: Secondary | ICD-10-CM | POA: Diagnosis not present

## 2020-03-17 DIAGNOSIS — L89310 Pressure ulcer of right buttock, unstageable: Secondary | ICD-10-CM | POA: Diagnosis not present

## 2020-03-17 HISTORY — PX: TEE WITHOUT CARDIOVERSION: SHX5443

## 2020-03-17 LAB — BASIC METABOLIC PANEL
Anion gap: 10 (ref 5–15)
BUN: 5 mg/dL — ABNORMAL LOW (ref 8–23)
CO2: 22 mmol/L (ref 22–32)
Calcium: 7.7 mg/dL — ABNORMAL LOW (ref 8.9–10.3)
Chloride: 102 mmol/L (ref 98–111)
Creatinine, Ser: 0.36 mg/dL — ABNORMAL LOW (ref 0.44–1.00)
GFR, Estimated: >60 mL/min (ref >60)
Glucose, Bld: 86 mg/dL (ref 70–99)
Potassium: 4.4 mmol/L (ref 3.5–5.1)
Sodium: 134 mmol/L — ABNORMAL LOW (ref 135–145)

## 2020-03-17 LAB — CBC
HCT: 35.1 % — ABNORMAL LOW (ref 36.0–46.0)
Hemoglobin: 12.1 g/dL (ref 12.0–15.0)
MCH: 33.8 pg (ref 26.0–34.0)
MCHC: 34.5 g/dL (ref 30.0–36.0)
MCV: 98 fL (ref 80.0–100.0)
Platelets: 333 10*3/uL (ref 150–400)
RBC: 3.58 MIL/uL — ABNORMAL LOW (ref 3.87–5.11)
RDW: 12.9 % (ref 11.5–15.5)
WBC: 15.3 10*3/uL — ABNORMAL HIGH (ref 4.0–10.5)
nRBC: 0 % (ref 0.0–0.2)

## 2020-03-17 LAB — MAGNESIUM: Magnesium: 1.8 mg/dL (ref 1.7–2.4)

## 2020-03-17 SURGERY — ECHOCARDIOGRAM, TRANSESOPHAGEAL
Anesthesia: General

## 2020-03-17 MED ORDER — LIDOCAINE HCL (PF) 2 % IJ SOLN
INTRAMUSCULAR | Status: AC
  Filled 2020-03-17: qty 5

## 2020-03-17 MED ORDER — PROPOFOL 500 MG/50ML IV EMUL
INTRAVENOUS | Status: DC | PRN
  Administered 2020-03-17: 200 ug/kg/min via INTRAVENOUS

## 2020-03-17 MED ORDER — GLYCOPYRROLATE PF 0.2 MG/ML IJ SOSY
PREFILLED_SYRINGE | INTRAMUSCULAR | Status: DC | PRN
  Administered 2020-03-17: .1 mg via INTRAVENOUS

## 2020-03-17 MED ORDER — FENTANYL CITRATE (PF) 100 MCG/2ML IJ SOLN
INTRAMUSCULAR | Status: AC
  Filled 2020-03-17: qty 2

## 2020-03-17 MED ORDER — LIDOCAINE VISCOUS HCL 2 % MT SOLN
OROMUCOSAL | Status: AC
  Filled 2020-03-17: qty 15

## 2020-03-17 MED ORDER — SODIUM CHLORIDE BACTERIOSTATIC 0.9 % IJ SOLN
INTRAMUSCULAR | Status: AC
  Filled 2020-03-17: qty 10

## 2020-03-17 MED ORDER — PROPOFOL 10 MG/ML IV BOLUS
INTRAVENOUS | Status: DC | PRN
  Administered 2020-03-17: 50 mg via INTRAVENOUS

## 2020-03-17 MED ORDER — LIDOCAINE HCL (CARDIAC) PF 100 MG/5ML IV SOSY
PREFILLED_SYRINGE | INTRAVENOUS | Status: DC | PRN
  Administered 2020-03-17: 40 mg via INTRAVENOUS

## 2020-03-17 MED ORDER — PHENYLEPHRINE 40 MCG/ML (10ML) SYRINGE FOR IV PUSH (FOR BLOOD PRESSURE SUPPORT)
PREFILLED_SYRINGE | INTRAVENOUS | Status: DC | PRN
  Administered 2020-03-17: 80 ug via INTRAVENOUS

## 2020-03-17 MED ORDER — PROPOFOL 10 MG/ML IV BOLUS
INTRAVENOUS | Status: AC
  Filled 2020-03-17: qty 60

## 2020-03-17 MED ORDER — LACTATED RINGERS IV SOLN: INTRAVENOUS | Status: DC

## 2020-03-17 MED ORDER — EPHEDRINE SULFATE-NACL 50-0.9 MG/10ML-% IV SOSY
PREFILLED_SYRINGE | INTRAVENOUS | Status: DC | PRN
  Administered 2020-03-17 (×2): 5 mg via INTRAVENOUS
  Administered 2020-03-17: 10 mg via INTRAVENOUS

## 2020-03-17 MED ORDER — FENTANYL CITRATE (PF) 100 MCG/2ML IJ SOLN
INTRAMUSCULAR | Status: DC | PRN
  Administered 2020-03-17: 25 ug via INTRAVENOUS

## 2020-03-17 MED ORDER — SODIUM CHLORIDE 0.9 % IV SOLN: INTRAVENOUS | Status: DC

## 2020-03-17 MED ORDER — LIDOCAINE VISCOUS HCL 2 % MT SOLN
10.0000 mL | Freq: Once | OROMUCOSAL | Status: AC
  Administered 2020-03-17: 10 mL via OROMUCOSAL

## 2020-03-17 NOTE — H&P (View-Only) (Signed)
Progress Note  Patient Name: Faith [name] Date of Encounter: 03/17/2020   Request per Dr. Manuella Ghazi for TEE to rule out endocarditis in patient with MRSA bacteremia.  Subjective   No abdominal pain, no nausea or emesis.  Continues on antibiotics.  Has been n.p.o. after midnight for potential TEE.  Inpatient Medications    Scheduled Meds: . atorvastatin  20 mg Oral Daily  . baclofen  10 mg Oral TID  . celecoxib  200 mg Oral BID  . collagenase   Topical Daily  . gabapentin  600 mg Oral TID  . propranolol  20 mg Oral BID   Continuous Infusions: . ciprofloxacin 400 mg (03/17/20 0140)  . metronidazole 500 mg (03/17/20 0406)  . vancomycin 750 mg (03/16/20 1804)   PRN Meds: acetaminophen **OR** acetaminophen, diazepam, morphine injection, ondansetron **OR** ondansetron (ZOFRAN) IV, polyethylene glycol, traMADol   Vital Signs    Vitals:   03/16/20 1331 03/16/20 1428 03/16/20 2029 03/17/20 0459  BP:  (!) 142/92 (!) 142/85 133/88  Pulse: 92 83 86 80  Resp:   17 16  Temp:  97.9 F (36.6 C) 98.8 F (37.1 C) 97.8 F (36.6 C)  TempSrc:  Oral Oral Oral  SpO2:  97% 97% 93%  Weight:      Height:        Intake/Output Summary (Last 24 hours) at 03/17/2020 1009 Last data filed at 03/17/2020 0646 Gross per 24 hour  Intake 480 ml  Output 1300 ml  Net -820 ml   Filed Weights   03/13/20 1000 03/13/20 1755  Weight: 45.8 kg 44 kg    ECG    An ECG dated 03/13/2020 was personally reviewed today and demonstrated:  Sinus tachycardia with R' in lead V1 and V2, left atrial enlargement, nonspecific ST changes.  Physical Exam   GEN: No acute distress.   Neck: No JVD. Cardiac: RRR, no murmur or gallop.  Respiratory: Nonlabored. Clear to auscultation bilaterally. GI: Soft, nontender, bowel sounds present. MS: No edema.  Labs    Chemistry Recent Labs  Lab 03/13/20 1020 03/14/20 0544 03/15/20 0616 03/16/20 0500 03/17/20 0500  NA 133*   < > 137 134* 134*  K 3.2*   < > 3.6 2.9*  4.4  CL 98   < > 107 104 102  CO2 24   < > 25 23 22   GLUCOSE 141*   < > 97 98 86  BUN 11   < > 13 6* 5*  CREATININE 0.64   < > 0.39* 0.34* 0.36*  CALCIUM 8.4*   < > 7.8* 7.5* 7.7*  PROT 6.2*  --   --   --   --   ALBUMIN 3.0*  --   --   --   --   AST 19  --   --   --   --   ALT 21  --   --   --   --   ALKPHOS 78  --   --   --   --   BILITOT 1.5*  --   --   --   --   GFRNONAA >60   < > >60 >60 >60  ANIONGAP 11   < > 5 7 10    < > = values in this interval not displayed.     Hematology Recent Labs  Lab 03/15/20 0616 03/16/20 0500 03/17/20 0500  WBC 13.6* 15.1* 15.3*  RBC 3.10* 3.36* 3.58*  HGB 10.3* 11.1* 12.1  HCT 31.0*  32.5* 35.1*  MCV 100.0 96.7 98.0  MCH 33.2 33.0 33.8  MCHC 33.2 34.2 34.5  RDW 13.3 13.0 12.9  PLT 279 288 333    Radiology    ECHOCARDIOGRAM COMPLETE  Result Date: 03/15/2020    ECHOCARDIOGRAM REPORT   Patient Name:   Faith [name] Date of Exam: 03/15/2020 Medical Rec #:  [id number]    Height:       64.0 in Accession #:    [id number]   Weight:       97.0 lb Date of Birth:  05/21/1955    BSA:          1.438 m Patient Age:    65 years     BP:           121/81 mmHg Patient Gender: F            HR:           85 bpm. Exam Location:  [facility] Procedure: 2D Echo, Cardiac Doppler and Color Doppler Indications:    Bacteremia  History:        Patient has no prior history of Echocardiogram examinations.  Sonographer:    Rachel Lane Referring Phys: [id number] [place]AT[place] D SHAH IMPRESSIONS  1. Left ventricular ejection fraction, by estimation, is 60 to 65%. The left ventricle has normal function. The left ventricle has no regional wall motion abnormalities. Left ventricular diastolic parameters were normal.  2. Right ventricular systolic function is normal. The right ventricular size is normal. There is mildly elevated pulmonary artery systolic pressure.  3. The mitral valve is normal in structure. Trivial mitral valve regurgitation. No evidence of mitral stenosis.  4. The aortic  valve is tricuspid. Aortic valve regurgitation is not visualized. Mild aortic valve sclerosis is present, with no evidence of aortic valve stenosis.  5. The inferior vena cava is normal in size with greater than 50% respiratory variability, suggesting right atrial pressure of 3 mmHg. FINDINGS  Left Ventricle: Left ventricular ejection fraction, by estimation, is 60 to 65%. The left ventricle has normal function. The left ventricle has no regional wall motion abnormalities. The left ventricular internal cavity size was normal in size. There is  no left ventricular hypertrophy. Left ventricular diastolic parameters were normal. Right Ventricle: The right ventricular size is normal. Right ventricular systolic function is normal. There is mildly elevated pulmonary artery systolic pressure. The tricuspid regurgitant velocity is 3.06 m/s, and with an assumed right atrial pressure of 3 mmHg, the estimated right ventricular systolic pressure is 40.5 mmHg. Left Atrium: Left atrial size was normal in size. Right Atrium: Right atrial size was normal in size. Pericardium: There is no evidence of pericardial effusion. Mitral Valve: The mitral valve is normal in structure. Mild mitral annular calcification. Trivial mitral valve regurgitation. No evidence of mitral valve stenosis. Tricuspid Valve: The tricuspid valve is normal in structure. Tricuspid valve regurgitation is mild . No evidence of tricuspid stenosis. Aortic Valve: The aortic valve is tricuspid. Aortic valve regurgitation is not visualized. Mild aortic valve sclerosis is present, with no evidence of aortic valve stenosis. Pulmonic Valve: The pulmonic valve was grossly normal. Pulmonic valve regurgitation is not visualized. No evidence of pulmonic stenosis. Aorta: The aortic root is normal in size and structure. Venous: The inferior vena cava is normal in size with greater than 50% respiratory variability, suggesting right atrial pressure of 3 mmHg. IAS/Shunts: No  atrial level shunt detected by color flow Doppler.  LEFT VENTRICLE PLAX 2D LVIDd:           4.21 cm     Diastology LVIDs:         3.27 cm     LV e' medial:    8.70 cm/s LV PW:         1.01 cm     LV E/e' medial:  8.6 LV IVS:        1.02 cm     LV e' lateral:   12.90 cm/s                            LV E/e' lateral: 5.8  LV Volumes (MOD) LV vol d, MOD A4C: 62.5 ml LV vol s, MOD A4C: 27.4 ml LV SV MOD A4C:     62.5 ml RIGHT VENTRICLE RV Basal diam:  4.04 cm RV Mid diam:    3.26 cm LEFT ATRIUM           Index       RIGHT ATRIUM           Index LA diam:      3.10 cm 2.16 cm/m  RA Area:     16.30 cm LA Vol (A2C): 45.5 ml 31.63 ml/m RA Volume:   40.60 ml  28.22 ml/m  AORTIC VALVE LVOT Vmax:   106.00 cm/s LVOT Vmean:  65.600 cm/s LVOT VTI:    0.202 m  AORTA Ao Root diam: 3.30 cm Ao Asc diam:  2.55 cm MITRAL VALVE               TRICUSPID VALVE MV Area (PHT): 4.54 cm    TR Peak grad:   37.5 mmHg MV Decel Time: 167 msec    TR Vmax:        306.00 cm/s MV E velocity: 75.00 cm/s MV A velocity: 62.60 cm/s  SHUNTS MV E/A ratio:  1.20        Systemic VTI: 0.20 m Kirk Ruths MD Electronically signed by Kirk Ruths MD Signature Date/Time: 03/15/2020/2:59:38 PM    Final     Patient Profile     65 y.o. female with a history of multiple sclerosis, COPD, hypertension, now admitted with questionable appendicitis, electrolyte abnormalities, and MRSA bacteremia.  TEE is requested to rule out endocarditis.  Transthoracic study did not demonstrate obvious vegetations.  Assessment & Plan    Request for TEE by Dr. Manuella Ghazi to rule out endocarditis in patient with MRSA bacteremia.  She is afebrile and hemodynamically stable, has been n.p.o. after midnight.  No reported swallowing difficulties recently, states that she underwent an endoscopy several years ago.  Risks and benefits discussed and she is in agreement to proceed.  We will plan on getting this coordinated today.  Signed, Rozann Lesches, MD  03/17/2020, 10:09 AM

## 2020-03-17 NOTE — Transfer of Care (Signed)
Immediate Anesthesia Transfer of Care Note  Patient: Faith [name]  Procedure(s) Performed: TRANSESOPHAGEAL ECHOCARDIOGRAM (TEE) WITH PROPOFOL (N/A )  Patient Location: PACU  Anesthesia Type:General  Level of Consciousness: awake and sedated  Airway & Oxygen Therapy: Patient Spontanous Breathing and Patient connected to nasal cannula oxygen  Post-op Assessment: Report given to RN and Post -op Vital signs reviewed and stable  Post vital signs: Reviewed and stable  Last Vitals:  Vitals Value Taken Time  BP 106/77 03/17/20 1206  Temp 36.9 C 03/17/20 1206  Pulse 76 03/17/20 1210  Resp 14 03/17/20 1210  SpO2 97 % 03/17/20 1210  Vitals shown include unvalidated device data.  Last Pain:  Vitals:   03/17/20 1054  TempSrc: Oral  PainSc: 4       Patients Stated Pain Goal: 0 (24/23/53 6144)  Complications: No complications documented.

## 2020-03-17 NOTE — Progress Notes (Signed)
Okay to place PICC per Dr. Linus Salmons with Infectious Disease.

## 2020-03-17 NOTE — Progress Notes (Signed)
Physical Therapy Treatment Patient Details Name: Faith [name] MRN: [id number] DOB: 01-17-1956 Today's Date: 03/17/2020    History of Present Illness Faith [name] is a 65 y.o. female with medical history significant for multiple sclerosis, COPD, pressure ulcer, hypertension.  Patient was brought to the ED via EMS with complaints of fall, and family not being able to take care of her.  My evaluation patient is awake and alert and able to give me detailed history.  Her knees have just been giving way.  She ambulates with a motorized wheelchair and a walker.  She did not hit her head.  Patient was in the ED yesterday for fall and right shoulder pain, x-ray showed moderate osteoarthritic change she was discharged home.  Family are unable to take care of patient.  She has had a buttock ulcer for a while.  She reports pain from the area.  She would like to be discharged to rehab facility after this hospitalization.  She denies pain with urination, but she reports some urinary frequency, no chest pain, no cough, no difficulty breathing, no vomiting no loose stools no abdominal pain.    PT Comments    Patient requires decreased assist for rolling today but continues to require bed rails. Patient able to transition to seated EOB with slow, labored movements requiring assist for LE movement and uprighting trunk. Patient demonstrates good sitting tolerance today but shows poor sitting balance requiring at least unilateral UE support or assist for sitting balance. Patient transfer to standing with RW and requires assist to remain standing.She is able to stand for 45 seconds-1 minute with assist before fatigue with use of RW. Patient assisted back into bed at end of session. Patient will benefit from continued physical therapy in hospital and recommended venue below to increase strength, balance, endurance for safe ADLs and gait.    Follow Up Recommendations  SNF     Equipment Recommendations  None recommended  by PT    Recommendations for Other Services       Precautions / Restrictions Precautions Precautions: Fall Restrictions Weight Bearing Restrictions: No    Mobility  Bed Mobility Overal bed mobility: Needs Assistance Bed Mobility: Rolling Rolling: Min assist   Supine to sit: Mod assist Sit to supine: Mod assist   General bed mobility comments: Patient requires assist and use of bed rails for rolling, transitions to seated with min/mod assist for LE movement and uprighting trunk, assist for LE back into bed  Transfers Overall transfer level: Needs assistance Equipment used: Rolling walker (2 wheeled) Transfers: Sit to/from Stand Sit to Stand: Mod assist         General transfer comment: transfer to standing using RW and cueing for sequencing, assist for LE weakness  Ambulation/Gait                 Stairs             Wheelchair Mobility    Modified Rankin (Stroke Patients Only)       Balance Overall balance assessment: Needs assistance Sitting-balance support: Feet supported Sitting balance-Leahy Scale: Poor Sitting balance - Comments: needs at least one UE supported in standing, even static sitting   Standing balance support: Bilateral upper extremity supported Standing balance-Leahy Scale: Poor Standing balance comment: requires assist and use of RW                            Cognition Arousal/Alertness: Awake/alert Behavior During  Therapy: WFL for tasks assessed/performed Overall Cognitive Status: Within Functional Limits for tasks assessed                                        Exercises      General Comments        Pertinent Vitals/Pain Pain Assessment: Faces Faces Pain Scale: Hurts a little bit Pain Location: bottom Pain Descriptors / Indicators: Grimacing;Guarding Pain Intervention(s): Limited activity within patient's tolerance;Monitored during session;Repositioned    Home Living                       Prior Function            PT Goals (current goals can now be found in the care plan section) Acute Rehab PT Goals Patient Stated Goal: get strong and return home PT Goal Formulation: With patient Time For Goal Achievement: 03/28/20 Potential to Achieve Goals: Fair Progress towards PT goals: Progressing toward goals    Frequency    Min 3X/week      PT Plan Current plan remains appropriate    Co-evaluation              AM-PAC PT "6 Clicks" Mobility   Outcome Measure  Help needed turning from your back to your side while in a flat bed without using bedrails?: A Little Help needed moving from lying on your back to sitting on the side of a flat bed without using bedrails?: A Lot Help needed moving to and from a bed to a chair (including a wheelchair)?: A Lot Help needed standing up from a chair using your arms (e.g., wheelchair or bedside chair)?: A Lot Help needed to walk in hospital room?: A Lot Help needed climbing 3-5 steps with a railing? : Total 6 Click Score: 12    End of Session Equipment Utilized During Treatment: Gait belt Activity Tolerance: Patient limited by pain Patient left: in bed;with call bell/phone within reach;with bed alarm set Nurse Communication: Mobility status PT Visit Diagnosis: Unsteadiness on feet (R26.81);Muscle weakness (generalized) (M62.81);Other abnormalities of gait and mobility (R26.89);Difficulty in walking, not elsewhere classified (R26.2);History of falling (Z91.81)     Time: 7322-0254 PT Time Calculation (min) (ACUTE ONLY): 21 min  Charges:  $Therapeutic Activity: 8-22 mins                     2:44 PM, 03/17/20 Mearl Latin PT, DPT Physical Therapist at [facility]

## 2020-03-17 NOTE — Progress Notes (Signed)
Pt has redness around the orbital area of  the left eye. Sight redness to the sclera of the left eye noted. No pain to left eye, pt stated.

## 2020-03-17 NOTE — Anesthesia Preprocedure Evaluation (Addendum)
Anesthesia Evaluation  Patient identified by MRN, date of birth, ID band Patient awake    Reviewed: Allergy & Precautions, NPO status , Patient's Chart, lab work & pertinent test results  History of Anesthesia Complications Negative for: history of anesthetic complications  Airway Mallampati: II   Neck ROM: Full   Comment: cervical myelopathy Dental  (+) Dental Advisory Given, Edentulous Upper, Poor Dentition, Chipped, Missing,    Pulmonary COPD, Current Smoker and Patient abstained from smoking.,    Pulmonary exam normal breath sounds clear to auscultation       Cardiovascular Exercise Tolerance: Poor hypertension, Pt. on medications + Peripheral Vascular Disease  Normal cardiovascular exam Rhythm:Regular Rate:Normal  13-Mar-2020 10:04:09 [place] [facility]-ER ROUTINE RECORD Sinus tachycardia LAE, consider biatrial enlargement RSR' in V1 or V2, right VCD or RVH Borderline ST depression, lateral leads similar to yesterday Confirmed by Sherwood Gambler [phone]) on 03/14/2020 9:04:29 AM   Neuro/Psych  Neuromuscular disease (multiple sclerosis, cervical myelopathy)    GI/Hepatic negative GI ROS, Neg liver ROS,   Endo/Other  negative endocrine ROS  Renal/GU negative Renal ROS     Musculoskeletal  (+) Arthritis  (back pain, cervical myelopathy),   Abdominal   Peds  Hematology  (+) anemia ,   Anesthesia Other Findings Right/Left eye burning sensation and redness  Reproductive/Obstetrics negative OB ROS                           Anesthesia Physical Anesthesia Plan  ASA: III  Anesthesia Plan: General   Post-op Pain Management:    Induction: Intravenous  PONV Risk Score and Plan: 2 and TIVA and Propofol infusion  Airway Management Planned: Nasal Cannula and Natural Airway  Additional Equipment:   Intra-op Plan:   Post-operative Plan:   Informed Consent: I have reviewed the  patients History and Physical, chart, labs and discussed the procedure including the risks, benefits and alternatives for the proposed anesthesia with the patient or authorized representative who has indicated his/her understanding and acceptance.     Dental advisory given  Plan Discussed with: Surgeon  Anesthesia Plan Comments:         Anesthesia Quick Evaluation

## 2020-03-17 NOTE — Progress Notes (Signed)
Progress Note  Patient Name: Faith [name] Date of Encounter: 03/17/2020   Request per Dr. Manuella Ghazi for TEE to rule out endocarditis in patient with MRSA bacteremia.  Subjective   No abdominal pain, no nausea or emesis.  Continues on antibiotics.  Has been n.p.o. after midnight for potential TEE.  Inpatient Medications    Scheduled Meds: . atorvastatin  20 mg Oral Daily  . baclofen  10 mg Oral TID  . celecoxib  200 mg Oral BID  . collagenase   Topical Daily  . gabapentin  600 mg Oral TID  . propranolol  20 mg Oral BID   Continuous Infusions: . ciprofloxacin 400 mg (03/17/20 0140)  . metronidazole 500 mg (03/17/20 0406)  . vancomycin 750 mg (03/16/20 1804)   PRN Meds: acetaminophen **OR** acetaminophen, diazepam, morphine injection, ondansetron **OR** ondansetron (ZOFRAN) IV, polyethylene glycol, traMADol   Vital Signs    Vitals:   03/16/20 1331 03/16/20 1428 03/16/20 2029 03/17/20 0459  BP:  (!) 142/92 (!) 142/85 133/88  Pulse: 92 83 86 80  Resp:   17 16  Temp:  97.9 F (36.6 C) 98.8 F (37.1 C) 97.8 F (36.6 C)  TempSrc:  Oral Oral Oral  SpO2:  97% 97% 93%  Weight:      Height:        Intake/Output Summary (Last 24 hours) at 03/17/2020 1009 Last data filed at 03/17/2020 0646 Gross per 24 hour  Intake 480 ml  Output 1300 ml  Net -820 ml   Filed Weights   03/13/20 1000 03/13/20 1755  Weight: 45.8 kg 44 kg    ECG    An ECG dated 03/13/2020 was personally reviewed today and demonstrated:  Sinus tachycardia with R' in lead V1 and V2, left atrial enlargement, nonspecific ST changes.  Physical Exam   GEN: No acute distress.   Neck: No JVD. Cardiac: RRR, no murmur or gallop.  Respiratory: Nonlabored. Clear to auscultation bilaterally. GI: Soft, nontender, bowel sounds present. MS: No edema.  Labs    Chemistry Recent Labs  Lab 03/13/20 1020 03/14/20 0544 03/15/20 0616 03/16/20 0500 03/17/20 0500  NA 133*   < > 137 134* 134*  K 3.2*   < > 3.6 2.9*  4.4  CL 98   < > 107 104 102  CO2 24   < > 25 23 22   GLUCOSE 141*   < > 97 98 86  BUN 11   < > 13 6* 5*  CREATININE 0.64   < > 0.39* 0.34* 0.36*  CALCIUM 8.4*   < > 7.8* 7.5* 7.7*  PROT 6.2*  --   --   --   --   ALBUMIN 3.0*  --   --   --   --   AST 19  --   --   --   --   ALT 21  --   --   --   --   ALKPHOS 78  --   --   --   --   BILITOT 1.5*  --   --   --   --   GFRNONAA >60   < > >60 >60 >60  ANIONGAP 11   < > 5 7 10    < > = values in this interval not displayed.     Hematology Recent Labs  Lab 03/15/20 0616 03/16/20 0500 03/17/20 0500  WBC 13.6* 15.1* 15.3*  RBC 3.10* 3.36* 3.58*  HGB 10.3* 11.1* 12.1  HCT 31.0*  32.5* 35.1*  MCV 100.0 96.7 98.0  MCH 33.2 33.0 33.8  MCHC 33.2 34.2 34.5  RDW 13.3 13.0 12.9  PLT 279 288 333    Radiology    ECHOCARDIOGRAM COMPLETE  Result Date: 03/15/2020    ECHOCARDIOGRAM REPORT   Patient Name:   Faith [name] Date of Exam: 03/15/2020 Medical Rec #:  [id number]    Height:       64.0 in Accession #:    [id number]   Weight:       97.0 lb Date of Birth:  20-Dec-1955    BSA:          1.438 m Patient Age:    54 years     BP:           121/81 mmHg Patient Gender: F            HR:           85 bpm. Exam Location:  [facility] Procedure: 2D Echo, Cardiac Doppler and Color Doppler Indications:    Bacteremia  History:        Patient has no prior history of Echocardiogram examinations.  Sonographer:    Merrie Roof Referring Phys: [id number] [place]  1. Left ventricular ejection fraction, by estimation, is 60 to 65%. The left ventricle has normal function. The left ventricle has no regional wall motion abnormalities. Left ventricular diastolic parameters were normal.  2. Right ventricular systolic function is normal. The right ventricular size is normal. There is mildly elevated pulmonary artery systolic pressure.  3. The mitral valve is normal in structure. Trivial mitral valve regurgitation. No evidence of mitral stenosis.  4. The aortic  valve is tricuspid. Aortic valve regurgitation is not visualized. Mild aortic valve sclerosis is present, with no evidence of aortic valve stenosis.  5. The inferior vena cava is normal in size with greater than 50% respiratory variability, suggesting right atrial pressure of 3 mmHg. FINDINGS  Left Ventricle: Left ventricular ejection fraction, by estimation, is 60 to 65%. The left ventricle has normal function. The left ventricle has no regional wall motion abnormalities. The left ventricular internal cavity size was normal in size. There is  no left ventricular hypertrophy. Left ventricular diastolic parameters were normal. Right Ventricle: The right ventricular size is normal. Right ventricular systolic function is normal. There is mildly elevated pulmonary artery systolic pressure. The tricuspid regurgitant velocity is 3.06 m/s, and with an assumed right atrial pressure of 3 mmHg, the estimated right ventricular systolic pressure is [id number] mmHg. Left Atrium: Left atrial size was normal in size. Right Atrium: Right atrial size was normal in size. Pericardium: There is no evidence of pericardial effusion. Mitral Valve: The mitral valve is normal in structure. Mild mitral annular calcification. Trivial mitral valve regurgitation. No evidence of mitral valve stenosis. Tricuspid Valve: The tricuspid valve is normal in structure. Tricuspid valve regurgitation is mild . No evidence of tricuspid stenosis. Aortic Valve: The aortic valve is tricuspid. Aortic valve regurgitation is not visualized. Mild aortic valve sclerosis is present, with no evidence of aortic valve stenosis. Pulmonic Valve: The pulmonic valve was grossly normal. Pulmonic valve regurgitation is not visualized. No evidence of pulmonic stenosis. Aorta: The aortic root is normal in size and structure. Venous: The inferior vena cava is normal in size with greater than 50% respiratory variability, suggesting right atrial pressure of 3 mmHg. IAS/Shunts: No  atrial level shunt detected by color flow Doppler.  LEFT VENTRICLE PLAX 2D LVIDd:  4.21 cm     Diastology LVIDs:         3.27 cm     LV e' medial:    8.70 cm/s LV PW:         1.01 cm     LV E/e' medial:  8.6 LV IVS:        1.02 cm     LV e' lateral:   12.90 cm/s                            LV E/e' lateral: 5.8  LV Volumes (MOD) LV vol d, MOD A4C: 62.5 ml LV vol s, MOD A4C: 27.4 ml LV SV MOD A4C:     62.5 ml RIGHT VENTRICLE RV Basal diam:  4.04 cm RV Mid diam:    3.26 cm LEFT ATRIUM           Index       RIGHT ATRIUM           Index LA diam:      3.10 cm 2.16 cm/m  RA Area:     16.30 cm LA Vol (A2C): 45.5 ml 31.63 ml/m RA Volume:   40.60 ml  28.22 ml/m  AORTIC VALVE LVOT Vmax:   106.00 cm/s LVOT Vmean:  65.600 cm/s LVOT VTI:    0.202 m  AORTA Ao Root diam: 3.30 cm Ao Asc diam:  2.55 cm MITRAL VALVE               TRICUSPID VALVE MV Area (PHT): 4.54 cm    TR Peak grad:   37.5 mmHg MV Decel Time: 167 msec    TR Vmax:        306.00 cm/s MV E velocity: 75.00 cm/s MV A velocity: 62.60 cm/s  SHUNTS MV E/A ratio:  1.20        Systemic VTI: 0.20 m Kirk Ruths MD Electronically signed by Kirk Ruths MD Signature Date/Time: 03/15/2020/2:59:38 PM    Final     Patient Profile     65 y.o. female with a history of multiple sclerosis, COPD, hypertension, now admitted with questionable appendicitis, electrolyte abnormalities, and MRSA bacteremia.  TEE is requested to rule out endocarditis.  Transthoracic study did not demonstrate obvious vegetations.  Assessment & Plan    Request for TEE by Dr. Manuella Ghazi to rule out endocarditis in patient with MRSA bacteremia.  She is afebrile and hemodynamically stable, has been n.p.o. after midnight.  No reported swallowing difficulties recently, states that she underwent an endoscopy several years ago.  Risks and benefits discussed and she is in agreement to proceed.  We will plan on getting this coordinated today.  Signed, Rozann Lesches, MD  03/17/2020, 10:09 AM

## 2020-03-17 NOTE — TOC Progression Note (Signed)
Transition of Care [facility]) - Progression Note    Patient Details  Name: Faith [name] MRN: [id number] Date of Birth: 1955/09/16  Transition of Care [facility]) CM/SW Contact  Natasha Bence, LCSW Phone Number: 03/17/2020, 2:58 PM  Clinical Narrative:    Patient requires 4 weeks of IV antiboitics. Family not able to provide supervision at home for patient intermittently with [facility] while receiving IV antiboitics. CSW inquired about SNF medicaid for patient with financial counselor. CSW awaiting reply.  CSW also faxed out patient to additional SNF facilities and followed up with supervisor concerning possible LOG if patient does not have SNF medicaid. TOC to follow.    Expected Discharge Plan: [place] Barriers to Discharge: Continued Medical Work up  Expected Discharge Plan and Services Expected Discharge Plan: [place] In-house Referral: Clinical Social Work   Post Acute Care Choice: [place] Living arrangements for the past 2 months: Single Family Home                 DME Arranged: N/A                     Social Determinants of Health (SDOH) Interventions    Readmission Risk Interventions No flowsheet data found.

## 2020-03-17 NOTE — Progress Notes (Signed)
PROGRESS NOTE    Faith [name]  [id number] DOB: February 17, 1956 DOA: 03/13/2020 PCP: Doree Albee, MD   Brief Narrative:  Faith Bailiff Brownis a 64 y.o.femalewith medical history significant formultiple sclerosis, COPD, pressure ulcer, hypertension. Patient was brought to the ED via EMS with complaints of fall, and family not being able to take care of her.Patient was admitted with SIRS criteria and decubitus ulcer with concern for need for debridement. She has had some worsening weakness and falls. She is now noted to have MRSA bacteremia. She has been started on ciprofloxacin and Flagyl for empiric treatment of appendicitis per GS.  Patient has undergone transesophageal echocardiogram on 1/17 with no findings of vegetations.  ID recommends 4 weeks of IV antibiotics.  PICC line to be placed today.  SNF placement pending.   Assessment & Plan:   Principal Problem:   Decubital ulcer Active Problems:   Falls frequently   Multiple sclerosis (HCC)   SIRS (systemic inflammatory response syndrome) (HCC)   Sacral decubitus ulcer, stage II (HCC)   Acute appendicitis   Bacteremia   MRSA bacteremia -Decubitus ulcer noted, but no obvious source -Continue vancomycin for total 4 weeks per ID -Repeat blood culturesnegative -2D echocardiogram TTE performed without any signs of vegetations.  LVEF 60 to 65% -TEE performed on 1/17 with no vegetations -No new back pain -PICC line placement today  Right buttock decubitus ulcer -Appears to be mostly stage II with no need for debridement per general surgery -Continuediet -IV morphine as needed for pain control  Questionable appendicitis -Without significant abdominal pain/N/V at this time; no need for surgical intervention -Cipro and Flagyl started per GS continue for total of 1 week -Serial abdominal exams per GS with outpatient follow-up  Multiple sclerosis -Multiple falls which may be related -PT evaluation recommending SNF which  patient is agreeable to -Continue home pain medications  Hypertension -Propranolol   DVT prophylaxis:SCDs Code Status:Full Family Communication:Discussed with daughter on the phone on 1/17 Disposition Plan: Status is: Inpatient  Remains inpatient appropriate because:IV treatments appropriate due to intensity of illness or inability to take PO and Inpatient level of care appropriate due to severity of illness   Dispo: The patient is from:Home Anticipated d/c is to:SNF Anticipated d/c date is: 1-2 days Patient currently is not medically stable to d/c.Patient needs PICC line placement for 4 weeks of vancomycin for MRSA bacteremia.  SNF placement pending.   Consultants:  General surgery  Cardiology   Discussed with ID 1/17  Procedures:  See below   Nutritional Assessment:  The patient's BMI is: Body mass index is 16.65 kg/m.Marland Kitchen  Skin Assessment:  I have examined the patient's skin and I agree with the wound assessment as performed by the wound care RN as outlined below:  Pressure Injury 03/13/20 Sacrum Medial Deep Tissue Pressure Injury - Purple or maroon localized area of discolored intact skin or blood-filled blister due to damage of underlying soft tissue from pressure and/or shear. 6.5 cm x 5.5 cm (Active)  03/13/20 1800  Location: Sacrum  Location Orientation: Medial  Staging: Deep Tissue Pressure Injury - Purple or maroon localized area of discolored intact skin or blood-filled blister due to damage of underlying soft tissue from pressure and/or shear.  Wound Description (Comments): 6.5 cm x 5.5 cm  Present on Admission: Yes     Pressure Injury 03/13/20 Heel Left;Medial Stage 1 -  Intact skin with non-blanchable redness of a localized area usually over a bony prominence. 3 cm x 2.5 cm (  Active)  03/13/20 1800  Location: Heel  Location Orientation: Left;Medial  Staging: Stage 1 -  Intact skin with  non-blanchable redness of a localized area usually over a bony prominence.  Wound Description (Comments): 3 cm x 2.5 cm  Present on Admission: Yes   Antimicrobials:  Anti-infectives (From admission, onward)   Start     Dose/Rate Route Frequency Ordered Stop   03/15/20 1300  ciprofloxacin (CIPRO) IVPB 400 mg        400 mg 200 mL/hr over 60 Minutes Intravenous Every 12 hours 03/15/20 1212     03/15/20 1300  metroNIDAZOLE (FLAGYL) IVPB 500 mg        500 mg 100 mL/hr over 60 Minutes Intravenous Every 8 hours 03/15/20 1212     03/14/20 1445  vancomycin (VANCOREADY) IVPB 750 mg/150 mL        750 mg 150 mL/hr over 60 Minutes Intravenous Every 24 hours 03/14/20 1358     03/14/20 1200  vancomycin (VANCOREADY) IVPB 750 mg/150 mL  Status:  Discontinued        750 mg 150 mL/hr over 60 Minutes Intravenous Every 24 hours 03/13/20 1253 03/13/20 1803   03/13/20 2300  cefTRIAXone (ROCEPHIN) 1 g in sodium chloride 0.9 % 100 mL IVPB  Status:  Discontinued        1 g 200 mL/hr over 30 Minutes Intravenous Every 24 hours 03/13/20 1803 03/14/20 1516   03/13/20 1145  ceFEPIme (MAXIPIME) 2 g in sodium chloride 0.9 % 100 mL IVPB        2 g 200 mL/hr over 30 Minutes Intravenous  Once 03/13/20 1130 03/13/20 1313   03/13/20 1145  metroNIDAZOLE (FLAGYL) IVPB 500 mg        500 mg 100 mL/hr over 60 Minutes Intravenous  Once 03/13/20 1130 03/13/20 1311   03/13/20 1115  levofloxacin (LEVAQUIN) IVPB 750 mg  Status:  Discontinued        750 mg 100 mL/hr over 90 Minutes Intravenous  Once 03/13/20 1109 03/13/20 1127   03/13/20 1115  aztreonam (AZACTAM) 2 g in sodium chloride 0.9 % 100 mL IVPB  Status:  Discontinued        2 g 200 mL/hr over 30 Minutes Intravenous  Once 03/13/20 1109 03/13/20 1132   03/13/20 1115  vancomycin (VANCOCIN) IVPB 1000 mg/200 mL premix        1,000 mg 200 mL/hr over 60 Minutes Intravenous  Once 03/13/20 1109 03/13/20 1307       Subjective: Patient seen and evaluated today with no new  acute complaints or concerns. No acute concerns or events noted overnight.  Objective: Vitals:   03/17/20 1215 03/17/20 1230 03/17/20 1245 03/17/20 1355  BP: 102/76 119/82  120/81  Pulse: 77 75  69  Resp: 18 14  19   Temp:   97.8 F (36.6 C) 97.9 F (36.6 C)  TempSrc:    Oral  SpO2: 98% 97%  97%  Weight:      Height:        Intake/Output Summary (Last 24 hours) at 03/17/2020 1420 Last data filed at 03/17/2020 1230 Gross per 24 hour  Intake 980 ml  Output 1300 ml  Net -320 ml   Filed Weights   03/13/20 1000 03/13/20 1755  Weight: 45.8 kg 44 kg    Examination:  General exam: Appears calm and comfortable  Respiratory system: Clear to auscultation. Respiratory effort normal. Cardiovascular system: S1 & S2 heard, RRR.  Gastrointestinal system: Abdomen is soft Central nervous  system: Alert and awake Extremities: No edema Skin: Findings with sacral decubitus ulcer stage II as noted previously Psychiatry: Flat affect.    Data Reviewed: I have personally reviewed following labs and imaging studies  CBC: Recent Labs  Lab 03/13/20 1020 03/14/20 0544 03/15/20 0616 03/16/20 0500 03/17/20 0500  WBC 32.8* 21.6* 13.6* 15.1* 15.3*  NEUTROABS 29.3*  --   --   --   --   HGB 13.5 10.7* 10.3* 11.1* 12.1  HCT 38.7 31.7* 31.0* 32.5* 35.1*  MCV 96.8 97.8 100.0 96.7 98.0  PLT 283 267 279 288 0000000   Basic Metabolic Panel: Recent Labs  Lab 03/13/20 1020 03/14/20 0544 03/15/20 0616 03/16/20 0500 03/17/20 0500  NA 133* 137 137 134* 134*  K 3.2* 3.0* 3.6 2.9* 4.4  CL 98 108 107 104 102  CO2 24 21* 25 23 22   GLUCOSE 141* 84 97 98 86  BUN 11 10 13  6* 5*  CREATININE 0.64 0.41* 0.39* 0.34* 0.36*  CALCIUM 8.4* 7.7* 7.8* 7.5* 7.7*  MG  --  1.7 1.6* 1.6* 1.8   GFR: Estimated Creatinine Clearance: 50 mL/min (A) (by C-G formula based on SCr of 0.36 mg/dL (L)). Liver Function Tests: Recent Labs  Lab 03/13/20 1020  AST 19  ALT 21  ALKPHOS 78  BILITOT 1.5*  PROT 6.2*   ALBUMIN 3.0*   No results for input(s): LIPASE, AMYLASE in the last 168 hours. No results for input(s): AMMONIA in the last 168 hours. Coagulation Profile: Recent Labs  Lab 03/13/20 1020  INR 1.1   Cardiac Enzymes: No results for input(s): CKTOTAL, CKMB, CKMBINDEX, TROPONINI in the last 168 hours. BNP (last 3 results) No results for input(s): PROBNP in the last 8760 hours. HbA1C: No results for input(s): HGBA1C in the last 72 hours. CBG: No results for input(s): GLUCAP in the last 168 hours. Lipid Profile: No results for input(s): CHOL, HDL, LDLCALC, TRIG, CHOLHDL, LDLDIRECT in the last 72 hours. Thyroid Function Tests: No results for input(s): TSH, T4TOTAL, FREET4, T3FREE, THYROIDAB in the last 72 hours. Anemia Panel: No results for input(s): VITAMINB12, FOLATE, FERRITIN, TIBC, IRON, RETICCTPCT in the last 72 hours. Sepsis Labs: Recent Labs  Lab 03/13/20 1122 03/13/20 1349  LATICACIDVEN 1.6 1.5    Recent Results (from the past 240 hour(s))  Urine culture     Status: Abnormal   Collection Time: 03/13/20 10:25 AM   Specimen: In/Out Cath Urine  Result Value Ref Range Status   Specimen Description   Final    IN/OUT CATH URINE Performed at [facility], [address], [place], [place] [address]    Special Requests   Final    NONE Performed at [facility], [address] [place], [place] [address]    Culture MULTIPLE SPECIES PRESENT, SUGGEST RECOLLECTION (A)  Final   Report Status 03/15/2020 FINAL  Final  SARS CORONAVIRUS 2 (TAT 6-24 HRS) Nasopharyngeal Nasopharyngeal Swab     Status: None   Collection Time: 03/13/20 11:07 AM   Specimen: Nasopharyngeal Swab  Result Value Ref Range Status   SARS Coronavirus 2 NEGATIVE NEGATIVE Final    Comment: (NOTE) SARS-CoV-2 target nucleic acids are NOT DETECTED.  The SARS-CoV-2 RNA is generally detectable in upper and lower respiratory specimens during the acute phase of infection. Negative results do not preclude SARS-CoV-2  infection, do not rule out co-infections with other pathogens, and should not be used as the sole basis for treatment or other patient management decisions. Negative results must be combined with  clinical observations, patient history, and epidemiological information. The expected result is Negative.  Fact Sheet for Patients: SugarRoll.be  Fact Sheet for Healthcare Providers: https://www.woods-mathews.com/  This test is not yet approved or cleared by the [place] FDA and  has been authorized for detection and/or diagnosis of SARS-CoV-2 by FDA under an Emergency Use Authorization (EUA). This EUA will remain  in effect (meaning this test can be used) for the duration of the COVID-19 declaration under Se ction 564(b)(1) of the Act, 21 U.S.C. section 360bbb-3(b)(1), unless the authorization is terminated or revoked sooner.  Performed at [facility], [place] [address] [place], [place] [address]   Blood culture (routine single)     Status: Abnormal   Collection Time: 03/13/20 11:22 AM   Specimen: BLOOD  Result Value Ref Range Status   Specimen Description   Final    BLOOD RIGHT ANTECUBITAL Performed at [facility], [address], [place], [place] [address]    Special Requests   Final    Blood Culture results may not be optimal due to an excessive volume of blood received in culture bottles BOTTLES DRAWN AEROBIC AND ANAEROBIC Performed at [facility], [address], [place], [place] [address]    Culture  Setup Time   Final    GRAM POSITIVE COCCI IN CLUSTERS AEROBIC BOTTLE ONLY Organism ID to follow CRITICAL RESULT CALLED TO, READ BACK BY AND VERIFIED WITHGloris Manchester INOMVE 7209 03/14/20 A BROWNING Performed at [facility], [place] [address], [place], [place] [address]    Culture METHICILLIN RESISTANT STAPHYLOCOCCUS AUREUS (A)  Final   Report Status 03/16/2020 FINAL  Final   Organism ID, Bacteria METHICILLIN RESISTANT  STAPHYLOCOCCUS AUREUS  Final      Susceptibility   Methicillin resistant staphylococcus aureus - MIC*    CIPROFLOXACIN >=8 RESISTANT Resistant     ERYTHROMYCIN <=0.25 SENSITIVE Sensitive     GENTAMICIN <=0.5 SENSITIVE Sensitive     OXACILLIN >=4 RESISTANT Resistant     TETRACYCLINE <=1 SENSITIVE Sensitive     VANCOMYCIN 1 SENSITIVE Sensitive     TRIMETH/SULFA <=10 SENSITIVE Sensitive     CLINDAMYCIN <=0.25 SENSITIVE Sensitive     RIFAMPIN <=0.5 SENSITIVE Sensitive     Inducible Clindamycin NEGATIVE Sensitive     * METHICILLIN RESISTANT STAPHYLOCOCCUS AUREUS  Blood Culture ID Panel (Reflexed)     Status: Abnormal   Collection Time: 03/13/20 11:22 AM  Result Value Ref Range Status   Enterococcus faecalis NOT DETECTED NOT DETECTED Final   Enterococcus Faecium NOT DETECTED NOT DETECTED Final   Listeria monocytogenes NOT DETECTED NOT DETECTED Final   Staphylococcus species DETECTED (A) NOT DETECTED Final    Comment: CRITICAL RESULT CALLED TO, READ BACK BY AND VERIFIED WITHGloris Manchester PHARMD 1425 03/14/20 A BROWNING    Staphylococcus aureus (BCID) DETECTED (A) NOT DETECTED Final    Comment: Methicillin (oxacillin)-resistant Staphylococcus aureus (MRSA). MRSA is predictably resistant to beta-lactam antibiotics (except ceftaroline). Preferred therapy is vancomycin unless clinically contraindicated. Patient requires contact precautions if  hospitalized. CRITICAL RESULT CALLED TO, READ BACK BY AND VERIFIED WITH: Gloris Manchester PHARMD 1425 03/14/20 A BROWNING    Staphylococcus epidermidis NOT DETECTED NOT DETECTED Final   Staphylococcus lugdunensis NOT DETECTED NOT DETECTED Final   Streptococcus species NOT DETECTED NOT DETECTED Final   Streptococcus agalactiae NOT DETECTED NOT DETECTED Final   Streptococcus pneumoniae NOT DETECTED NOT DETECTED Final   Streptococcus pyogenes NOT DETECTED NOT DETECTED Final   A.calcoaceticus-baumannii NOT DETECTED NOT  DETECTED Final   Bacteroides fragilis NOT DETECTED  NOT DETECTED Final   Enterobacterales NOT DETECTED NOT DETECTED Final   Enterobacter cloacae complex NOT DETECTED NOT DETECTED Final   Escherichia coli NOT DETECTED NOT DETECTED Final   Klebsiella aerogenes NOT DETECTED NOT DETECTED Final   Klebsiella oxytoca NOT DETECTED NOT DETECTED Final   Klebsiella pneumoniae NOT DETECTED NOT DETECTED Final   Proteus species NOT DETECTED NOT DETECTED Final   Salmonella species NOT DETECTED NOT DETECTED Final   Serratia marcescens NOT DETECTED NOT DETECTED Final   Haemophilus influenzae NOT DETECTED NOT DETECTED Final   Neisseria meningitidis NOT DETECTED NOT DETECTED Final   Pseudomonas aeruginosa NOT DETECTED NOT DETECTED Final   Stenotrophomonas maltophilia NOT DETECTED NOT DETECTED Final   Candida albicans NOT DETECTED NOT DETECTED Final   Candida auris NOT DETECTED NOT DETECTED Final   Candida glabrata NOT DETECTED NOT DETECTED Final   Candida krusei NOT DETECTED NOT DETECTED Final   Candida parapsilosis NOT DETECTED NOT DETECTED Final   Candida tropicalis NOT DETECTED NOT DETECTED Final   Cryptococcus neoformans/gattii NOT DETECTED NOT DETECTED Final   Meth resistant mecA/C and MREJ DETECTED (A) NOT DETECTED Final    Comment: CRITICAL RESULT CALLED TO, READ BACK BY AND VERIFIED WITHGloris Manchester PHARMD 1425 03/14/20 A BROWNING Performed at [facility], [address], [place], [place] [address]   Resp Panel by RT-PCR (Flu A&B, Covid) Nasopharyngeal Swab     Status: None   Collection Time: 03/13/20  2:24 PM   Specimen: Nasopharyngeal Swab; Nasopharyngeal(NP) swabs in vial transport medium  Result Value Ref Range Status   SARS Coronavirus 2 by RT PCR NEGATIVE NEGATIVE Final    Comment: (NOTE) SARS-CoV-2 target nucleic acids are NOT DETECTED.  The SARS-CoV-2 RNA is generally detectable in upper respiratory specimens during the acute phase of infection. The lowest concentration of SARS-CoV-2 viral copies this assay can detect is 138  copies/mL. A negative result does not preclude SARS-Cov-2 infection and should not be used as the sole basis for treatment or other patient management decisions. A negative result may occur with  improper specimen collection/handling, submission of specimen other than nasopharyngeal swab, presence of viral mutation(s) within the areas targeted by this assay, and inadequate number of viral copies(<138 copies/mL). A negative result must be combined with clinical observations, patient history, and epidemiological information. The expected result is Negative.  Fact Sheet for Patients:  EntrepreneurPulse.com.au  Fact Sheet for Healthcare Providers:  IncredibleEmployment.be  This test is no t yet approved or cleared by the [place] FDA and  has been authorized for detection and/or diagnosis of SARS-CoV-2 by FDA under an Emergency Use Authorization (EUA). This EUA will remain  in effect (meaning this test can be used) for the duration of the COVID-19 declaration under Section 564(b)(1) of the Act, 21 U.S.C.section 360bbb-3(b)(1), unless the authorization is terminated  or revoked sooner.       Influenza A by PCR NEGATIVE NEGATIVE Final   Influenza B by PCR NEGATIVE NEGATIVE Final    Comment: (NOTE) The Xpert Xpress SARS-CoV-2/FLU/RSV plus assay is intended as an aid in the diagnosis of influenza from Nasopharyngeal swab specimens and should not be used as a sole basis for treatment. Nasal washings and aspirates are unacceptable for Xpert Xpress SARS-CoV-2/FLU/RSV testing.  Fact Sheet for Patients: EntrepreneurPulse.com.au  Fact Sheet for Healthcare Providers: IncredibleEmployment.be  This test is not yet approved or cleared by the [place] and has been authorized  for detection and/or diagnosis of SARS-CoV-2 by FDA under an Emergency Use Authorization (EUA). This EUA will remain in effect (meaning  this test can be used) for the duration of the COVID-19 declaration under Section 564(b)(1) of the Act, 21 U.S.C. section 360bbb-3(b)(1), unless the authorization is terminated or revoked.  Performed at [facility], [address] [place], [place] [address]   MRSA PCR Screening     Status: None   Collection Time: 03/14/20  2:01 PM   Specimen: Nasal Mucosa; Nasopharyngeal  Result Value Ref Range Status   MRSA by PCR NEGATIVE NEGATIVE Final    Comment:        The GeneXpert MRSA Assay (FDA approved for NASAL specimens only), is one component of a comprehensive MRSA colonization surveillance program. It is not intended to diagnose MRSA infection nor to guide or monitor treatment for MRSA infections. Performed at [facility], [address] [place], [place] [address]   Culture, blood (routine x 2)     Status: None (Preliminary result)   Collection Time: 03/14/20  2:05 PM   Specimen: BLOOD  Result Value Ref Range Status   Specimen Description BLOOD LEFT ANTECUBITAL  Final   Special Requests   Final    Blood Culture adequate volume BOTTLES DRAWN AEROBIC AND ANAEROBIC   Culture   Final    NO GROWTH 3 DAYS Performed at [facility], [address], [place], [place] [address]    Report Status PENDING  Incomplete  Culture, blood (routine x 2)     Status: None (Preliminary result)   Collection Time: 03/14/20  2:09 PM   Specimen: BLOOD  Result Value Ref Range Status   Specimen Description BLOOD BLOOD LEFT HAND  Final   Special Requests   Final    Blood Culture adequate volume BOTTLES DRAWN AEROBIC AND ANAEROBIC   Culture   Final    NO GROWTH 3 DAYS Performed at [facility], [address], [place], [place] [address]    Report Status PENDING  Incomplete         Radiology Studies: ECHOCARDIOGRAM COMPLETE  Result Date: 03/15/2020    ECHOCARDIOGRAM REPORT   Patient Name:   Faith [name] Date of Exam: 03/15/2020 Medical Rec #:  [id number]    Height:       64.0 in Accession #:     [id number]   Weight:       97.0 lb Date of Birth:  1955-04-23    BSA:          1.438 m Patient Age:    44 years     BP:           121/81 mmHg Patient Gender: F            HR:           85 bpm. Exam Location:  [facility] Procedure: 2D Echo, Cardiac Doppler and Color Doppler Indications:    Bacteremia  History:        Patient has no prior history of Echocardiogram examinations.  Sonographer:    Merrie Roof Referring Phys: [id number] [place]  1. Left ventricular ejection fraction, by estimation, is 60 to 65%. The left ventricle has normal function. The left ventricle has no regional wall motion abnormalities. Left ventricular diastolic parameters were normal.  2. Right ventricular systolic function is normal. The right ventricular size is normal. There is mildly elevated pulmonary artery systolic pressure.  3. The mitral valve is normal in structure.  Trivial mitral valve regurgitation. No evidence of mitral stenosis.  4. The aortic valve is tricuspid. Aortic valve regurgitation is not visualized. Mild aortic valve sclerosis is present, with no evidence of aortic valve stenosis.  5. The inferior vena cava is normal in size with greater than 50% respiratory variability, suggesting right atrial pressure of 3 mmHg. FINDINGS  Left Ventricle: Left ventricular ejection fraction, by estimation, is 60 to 65%. The left ventricle has normal function. The left ventricle has no regional wall motion abnormalities. The left ventricular internal cavity size was normal in size. There is  no left ventricular hypertrophy. Left ventricular diastolic parameters were normal. Right Ventricle: The right ventricular size is normal. Right ventricular systolic function is normal. There is mildly elevated pulmonary artery systolic pressure. The tricuspid regurgitant velocity is 3.06 m/s, and with an assumed right atrial pressure of 3 mmHg, the estimated right ventricular systolic pressure is [id number] mmHg. Left Atrium: Left atrial size  was normal in size. Right Atrium: Right atrial size was normal in size. Pericardium: There is no evidence of pericardial effusion. Mitral Valve: The mitral valve is normal in structure. Mild mitral annular calcification. Trivial mitral valve regurgitation. No evidence of mitral valve stenosis. Tricuspid Valve: The tricuspid valve is normal in structure. Tricuspid valve regurgitation is mild . No evidence of tricuspid stenosis. Aortic Valve: The aortic valve is tricuspid. Aortic valve regurgitation is not visualized. Mild aortic valve sclerosis is present, with no evidence of aortic valve stenosis. Pulmonic Valve: The pulmonic valve was grossly normal. Pulmonic valve regurgitation is not visualized. No evidence of pulmonic stenosis. Aorta: The aortic root is normal in size and structure. Venous: The inferior vena cava is normal in size with greater than 50% respiratory variability, suggesting right atrial pressure of 3 mmHg. IAS/Shunts: No atrial level shunt detected by color flow Doppler.  LEFT VENTRICLE PLAX 2D LVIDd:         4.21 cm     Diastology LVIDs:         3.27 cm     LV e' medial:    8.70 cm/s LV PW:         1.01 cm     LV E/e' medial:  8.6 LV IVS:        1.02 cm     LV e' lateral:   12.90 cm/s                            LV E/e' lateral: 5.8  LV Volumes (MOD) LV vol d, MOD A4C: 62.5 ml LV vol s, MOD A4C: 27.4 ml LV SV MOD A4C:     62.5 ml RIGHT VENTRICLE RV Basal diam:  4.04 cm RV Mid diam:    3.26 cm LEFT ATRIUM           Index       RIGHT ATRIUM           Index LA diam:      3.10 cm 2.16 cm/m  RA Area:     16.30 cm LA Vol (A2C): 45.5 ml 31.63 ml/m RA Volume:   40.60 ml  28.22 ml/m  AORTIC VALVE LVOT Vmax:   106.00 cm/s LVOT Vmean:  65.600 cm/s LVOT VTI:    0.202 m  AORTA Ao Root diam: 3.30 cm Ao Asc diam:  2.55 cm MITRAL VALVE               TRICUSPID VALVE MV Area (PHT):  4.54 cm    TR Peak grad:   37.5 mmHg MV Decel Time: 167 msec    TR Vmax:        306.00 cm/s MV E velocity: 75.00 cm/s MV A  velocity: 62.60 cm/s  SHUNTS MV E/A ratio:  1.20        Systemic VTI: 0.20 m Kirk Ruths MD Electronically signed by Kirk Ruths MD Signature Date/Time: 03/15/2020/2:59:38 PM    Final    ECHO TEE  Result Date: 03/17/2020    TRANSESOPHOGEAL ECHO REPORT   Patient Name:   Faith [name] Date of Exam: 03/17/2020 Medical Rec #:  [id number]    Height:       64.0 in Accession #:    [id number]   Weight:       97.0 lb Date of Birth:  1955-06-09    BSA:          1.438 m Patient Age:    48 years     BP:           113/89 mmHg Patient Gender: F            HR:           75 bpm. Exam Location:  [facility] Procedure: Transesophageal Echo, Cardiac Doppler and Color Doppler Indications:    Bacteremia 790.7 / R78.81  History:        Patient has prior history of Echocardiogram examinations, most                 recent 03/16/2019. COPD; Risk Factors:Hypertension, Dyslipidemia                 and Current Smoker. Multiple sclerosis.  Sonographer:    Alvino Chapel RCS Referring Phys: [place]: TEE procedure time was 8 minutes. The transesophogeal probe was passed without difficulty through the esophogus of the patient. Imaged were obtained with the patient in a left lateral decubitus position. Sedation performed by different physician. The patient was monitored while under deep sedation. Image quality was good. The patient developed no complications during the procedure. IMPRESSIONS  1. Left ventricular ejection fraction, by estimation, is 65 to 70%. The left ventricle has normal function. The left ventricle has no regional wall motion abnormalities.  2. Right ventricular systolic function is normal. The right ventricular size is normal.  3. No left atrial/left atrial appendage thrombus was detected. The LAA emptying velocity was 60 cm/s.  4. The mitral valve is grossly normal. Mild mitral valve regurgitation.  5. Tricuspid valve regurgitation is mild to moderate.  6. The aortic valve is tricuspid. Aortic valve  regurgitation is not visualized.  7. There is Moderate (Grade III) atheroma plaque involving the descending aorta.  8. Evidence of small PFO with trivial degree of left to right shunting.  9. No obvious valvular vegetations noted. FINDINGS  Left Ventricle: Left ventricular ejection fraction, by estimation, is 65 to 70%. The left ventricle has normal function. The left ventricle has no regional wall motion abnormalities. The left ventricular internal cavity size was normal in size. Right Ventricle: The right ventricular size is normal. No increase in right ventricular wall thickness. Right ventricular systolic function is normal. Left Atrium: Left atrial size was normal in size. No left atrial/left atrial appendage thrombus was detected. The LAA emptying velocity was 60 cm/s. Right Atrium: Right atrial size was normal in size. Pericardium: There is no evidence of pericardial effusion. Mitral Valve: The mitral valve is grossly normal. Mild  mitral valve regurgitation. Tricuspid Valve: The tricuspid valve is grossly normal. Tricuspid valve regurgitation is mild to moderate. Aortic Valve: The aortic valve is tricuspid. Aortic valve regurgitation is not visualized. Pulmonic Valve: The pulmonic valve was grossly normal. Pulmonic valve regurgitation is mild. Aorta: The aortic root is normal in size and structure. There is moderate (Grade III) atheroma plaque involving the descending aorta. IAS/Shunts: Evidence of small PFO with trivial degree of left to right shunting. Rozann Lesches MD Electronically signed by Rozann Lesches MD Signature Date/Time: 03/17/2020/12:59:45 PM    Final    [place] EKG SITE RITE  Result Date: 03/17/2020 If Site Rite image not attached, placement could not be confirmed due to current cardiac rhythm.       Scheduled Meds: . atorvastatin  20 mg Oral Daily  . baclofen  10 mg Oral TID  . celecoxib  200 mg Oral BID  . collagenase   Topical Daily  . gabapentin  600 mg Oral TID  . propranolol   20 mg Oral BID   Continuous Infusions: . ciprofloxacin 400 mg (03/17/20 0140)  . metronidazole 500 mg (03/17/20 0406)  . vancomycin 750 mg (03/16/20 1804)     LOS: 4 days    Time spent: 35 minutes    Jarrid Lienhard D Manuella Ghazi, DO [facility]  If 7PM-7AM, please contact night-coverage [email or web address] 03/17/2020, 2:20 PM

## 2020-03-17 NOTE — Progress Notes (Signed)
Patient not seen as she was undergoing transesophageal echo.  Discussed care with Dr. Manuella Ghazi.

## 2020-03-17 NOTE — Interval H&P Note (Signed)
History and Physical Interval Note:  03/17/2020 11:40 AM  Faith [name]  has presented today for surgery, with the diagnosis of bacteremia.  The various methods of treatment have been discussed with the patient and family. After consideration of risks, benefits and other options for treatment, the patient has consented to  Procedure(s): TRANSESOPHAGEAL ECHOCARDIOGRAM (TEE) WITH PROPOFOL (N/A) as a surgical intervention.  The patient's history has been reviewed, patient examined, no change in status, stable for surgery.  I have reviewed the patient's chart and labs.  Questions were answered to the patient's satisfaction.     Rozann Lesches

## 2020-03-17 NOTE — CV Procedure (Signed)
Transesophageal echocardiogram  Indication: Bacteremia  Description of procedure: After informed consent was obtained, the patient was taken to the procedure suite where a timeout was performed.  She was placed in left lateral decubitus position, bite-block in place.  Deep sedation was provided via the Anesthesia service with use of propofol, please refer to their complete records for details of medication dose administration and monitoring.  A multiplane transesophageal echocardiographic probe was easily inserted into the esophagus and multiple images obtained:  1. Left ventricular ejection fraction, by estimation, is 65 to 70%. The  left ventricle has normal function. The left ventricle has no regional  wall motion abnormalities.  2. Right ventricular systolic function is normal. The right ventricular  size is normal.  3. No left atrial/left atrial appendage thrombus was detected. The LAA  emptying velocity was 60 cm/s.  4. The mitral valve is grossly normal. Mild mitral valve regurgitation.  5. Tricuspid valve regurgitation is mild to moderate.  6. The aortic valve is tricuspid. Aortic valve regurgitation is not  visualized.  7. There is Moderate (Grade III) atheroma plaque involving the descending  aorta.  8. Evidence of small PFO with trivial degree of left to right shunting.  9. No obvious valvular vegetations noted.   Patient tolerated the procedure well without immediate complications.  Satira Sark, M.D., F.A.C.C.

## 2020-03-17 NOTE — Progress Notes (Signed)
*  PRELIMINARY RESULTS* Echocardiogram Echocardiogram Transesophageal has been performed.  Faith [name] 03/17/2020, 12:01 PM

## 2020-03-17 NOTE — Progress Notes (Signed)
PT Cancellation Note  Patient Details Name: Faith [name] MRN: [id number] DOB: [birth year]   Cancelled Treatment:    Reason Eval/Treat Not Completed: Patient at procedure or test/unavailable; Spoke with RN who stated patient was at TEE, will check back later if time permits.  11:10 AM, 03/17/20 Mearl Latin PT, DPT Physical Therapist at [facility]

## 2020-03-17 NOTE — Anesthesia Postprocedure Evaluation (Signed)
Anesthesia Post Note  Patient: Faith [name]  Procedure(s) Performed: TRANSESOPHAGEAL ECHOCARDIOGRAM (TEE) WITH PROPOFOL (N/A )  Patient location during evaluation: PACU Anesthesia Type: General Level of consciousness: awake and alert and oriented Pain management: pain level controlled Vital Signs Assessment: post-procedure vital signs reviewed and stable Respiratory status: spontaneous breathing, respiratory function stable and patient connected to nasal cannula oxygen Cardiovascular status: blood pressure returned to baseline Postop Assessment: no apparent nausea or vomiting Anesthetic complications: no   No complications documented.   Last Vitals:  Vitals:   03/17/20 1054 03/17/20 1206  BP: 125/86   Pulse: 73   Resp: 15   Temp: 37.1 C 36.9 C  SpO2: 93% 97%    Last Pain:  Vitals:   03/17/20 1206  TempSrc:   PainSc: 0-No pain                 Meriah Shands C Raine Blodgett

## 2020-03-18 DIAGNOSIS — L89310 Pressure ulcer of right buttock, unstageable: Secondary | ICD-10-CM | POA: Diagnosis not present

## 2020-03-18 DIAGNOSIS — L89152 Pressure ulcer of sacral region, stage 2: Secondary | ICD-10-CM | POA: Diagnosis not present

## 2020-03-18 LAB — VANCOMYCIN, TROUGH: Vancomycin Tr: <4 ug/mL — ABNORMAL LOW (ref 15–20)

## 2020-03-18 MED ORDER — CHLORHEXIDINE GLUCONATE CLOTH 2 % EX PADS
6.0000 | MEDICATED_PAD | Freq: Every day | CUTANEOUS | Status: DC
  Administered 2020-03-18 – 2020-03-31 (×13): 6 via TOPICAL

## 2020-03-18 MED ORDER — VANCOMYCIN HCL 1500 MG/300ML IV SOLN
1500.0000 mg | INTRAVENOUS | Status: DC
  Administered 2020-03-18 – 2020-03-20 (×3): 1500 mg via INTRAVENOUS
  Filled 2020-03-18 (×4): qty 300

## 2020-03-18 NOTE — Progress Notes (Signed)
[facility] Progress Note  1 Day Post-Op  Subjective: No abdominal pain and tolerating a diet. MRSA bacteremia but TEE with no vegetations.   Objective: Vital signs in last 24 hours: Temp:  [97.9 F (36.6 C)-98.9 F (37.2 C)] 98.4 F (36.9 C) (01/18 1035) Pulse Rate:  [68-76] 71 (01/18 1035) Resp:  [18-19] 18 (01/18 1035) BP: (120-141)/(78-90) 128/78 (01/18 1035) SpO2:  [96 %-98 %] 96 % (01/18 1035) Last BM Date: 03/12/20  Intake/Output from previous day: 01/17 0701 - 01/18 0700 In: 1100 [P.O.:600; I.V.:500] Out: 1450 [Urine:1450] Intake/Output this shift: No intake/output data recorded.  General appearance: alert, cooperative and no distress Resp: normal work of breathing GI: soft, nondistended, nontender stage II sacral decubitus, firm at base, no need for mechanical debridement      Lab Results:  Recent Labs    03/16/20 0500 03/17/20 0500  WBC 15.1* 15.3*  HGB 11.1* 12.1  HCT 32.5* 35.1*  PLT 288 333   BMET Recent Labs    03/16/20 0500 03/17/20 0500  NA 134* 134*  K 2.9* 4.4  CL 104 102  CO2 23 22  GLUCOSE 98 86  BUN 6* 5*  CREATININE 0.34* 0.36*  CALCIUM 7.5* 7.7*   PT/INR No results for input(s): LABPROT, INR in the last 72 hours.  Studies/Results: ECHO TEE  Result Date: 03/17/2020    TRANSESOPHOGEAL ECHO REPORT   Patient Name:   Faith [name] Date of Exam: 03/17/2020 Medical Rec #:  [id number]    Height:       64.0 in Accession #:    [id number]   Weight:       97.0 lb Date of Birth:  29-Nov-1955    BSA:          1.438 m Patient Age:    65 years     BP:           113/89 mmHg Patient Gender: F            HR:           75 bpm. Exam Location:  [facility] Procedure: Transesophageal Echo, Cardiac Doppler and Color Doppler Indications:    Bacteremia 790.7 / R78.81  History:        Patient has prior history of Echocardiogram examinations, most                 recent 03/16/2019. COPD; Risk Factors:Hypertension, Dyslipidemia                 and  Current Smoker. Multiple sclerosis.  Sonographer:    Alvino Chapel RCS Referring Phys: [place]: TEE procedure time was 8 minutes. The transesophogeal probe was passed without difficulty through the esophogus of the patient. Imaged were obtained with the patient in a left lateral decubitus position. Sedation performed by different physician. The patient was monitored while under deep sedation. Image quality was good. The patient developed no complications during the procedure. IMPRESSIONS  1. Left ventricular ejection fraction, by estimation, is 65 to 70%. The left ventricle has normal function. The left ventricle has no regional wall motion abnormalities.  2. Right ventricular systolic function is normal. The right ventricular size is normal.  3. No left atrial/left atrial appendage thrombus was detected. The LAA emptying velocity was 60 cm/s.  4. The mitral valve is grossly normal. Mild mitral valve regurgitation.  5. Tricuspid valve regurgitation is mild to moderate.  6. The aortic valve is tricuspid. Aortic valve regurgitation is  not visualized.  7. There is Moderate (Grade III) atheroma plaque involving the descending aorta.  8. Evidence of small PFO with trivial degree of left to right shunting.  9. No obvious valvular vegetations noted. FINDINGS  Left Ventricle: Left ventricular ejection fraction, by estimation, is 65 to 70%. The left ventricle has normal function. The left ventricle has no regional wall motion abnormalities. The left ventricular internal cavity size was normal in size. Right Ventricle: The right ventricular size is normal. No increase in right ventricular wall thickness. Right ventricular systolic function is normal. Left Atrium: Left atrial size was normal in size. No left atrial/left atrial appendage thrombus was detected. The LAA emptying velocity was 60 cm/s. Right Atrium: Right atrial size was normal in size. Pericardium: There is no evidence of pericardial  effusion. Mitral Valve: The mitral valve is grossly normal. Mild mitral valve regurgitation. Tricuspid Valve: The tricuspid valve is grossly normal. Tricuspid valve regurgitation is mild to moderate. Aortic Valve: The aortic valve is tricuspid. Aortic valve regurgitation is not visualized. Pulmonic Valve: The pulmonic valve was grossly normal. Pulmonic valve regurgitation is mild. Aorta: The aortic root is normal in size and structure. There is moderate (Grade III) atheroma plaque involving the descending aorta. IAS/Shunts: Evidence of small PFO with trivial degree of left to right shunting. Rozann Lesches MD Electronically signed by Rozann Lesches MD Signature Date/Time: 03/17/2020/12:59:45 PM    Final    [place] EKG SITE RITE  Result Date: 03/17/2020 If Site Rite image not attached, placement could not be confirmed due to current cardiac rhythm.   Anti-infectives: Anti-infectives (From admission, onward)   Start     Dose/Rate Route Frequency Ordered Stop   03/15/20 1300  ciprofloxacin (CIPRO) IVPB 400 mg        400 mg 200 mL/hr over 60 Minutes Intravenous Every 12 hours 03/15/20 1212     03/15/20 1300  metroNIDAZOLE (FLAGYL) IVPB 500 mg        500 mg 100 mL/hr over 60 Minutes Intravenous Every 8 hours 03/15/20 1212     03/14/20 1445  vancomycin (VANCOREADY) IVPB 750 mg/150 mL        750 mg 150 mL/hr over 60 Minutes Intravenous Every 24 hours 03/14/20 1358     03/14/20 1200  vancomycin (VANCOREADY) IVPB 750 mg/150 mL  Status:  Discontinued        750 mg 150 mL/hr over 60 Minutes Intravenous Every 24 hours 03/13/20 1253 03/13/20 1803   03/13/20 2300  cefTRIAXone (ROCEPHIN) 1 g in sodium chloride 0.9 % 100 mL IVPB  Status:  Discontinued        1 g 200 mL/hr over 30 Minutes Intravenous Every 24 hours 03/13/20 1803 03/14/20 1516   03/13/20 1145  ceFEPIme (MAXIPIME) 2 g in sodium chloride 0.9 % 100 mL IVPB        2 g 200 mL/hr over 30 Minutes Intravenous  Once 03/13/20 1130 03/13/20 1313    03/13/20 1145  metroNIDAZOLE (FLAGYL) IVPB 500 mg        500 mg 100 mL/hr over 60 Minutes Intravenous  Once 03/13/20 1130 03/13/20 1311   03/13/20 1115  levofloxacin (LEVAQUIN) IVPB 750 mg  Status:  Discontinued        750 mg 100 mL/hr over 90 Minutes Intravenous  Once 03/13/20 1109 03/13/20 1127   03/13/20 1115  aztreonam (AZACTAM) 2 g in sodium chloride 0.9 % 100 mL IVPB  Status:  Discontinued        2 g  200 mL/hr over 30 Minutes Intravenous  Once 03/13/20 1109 03/13/20 1132   03/13/20 1115  vancomycin (VANCOCIN) IVPB 1000 mg/200 mL premix        1,000 mg 200 mL/hr over 60 Minutes Intravenous  Once 03/13/20 1109 03/13/20 1307      Assessment/Plan: Ms. Neuberger is a 65 yo with a sacral decubitus ulcer stage II that is doing well with santyl and dressing changes. She has incidentally found possible early appendicitis on CT pelvis done for the sacral wound.  She had no clinical symptoms of appendicitis. Treating with antibiotics given the possibility. No need for surgery at this time.  Would finish antibiotics for possible appendicitis Continue santyl and dressing changes Dc to a SNF   LOS: 5 days    Virl Cagey 03/18/2020

## 2020-03-18 NOTE — Progress Notes (Addendum)
Pharmacy Antibiotic Note  Faith [name] is a 65 y.o. female admitted on 03/13/2020 with bacteremia and intra abdominal infection.  Pharmacy has been consulted for Vancomycin dosing. VT reported < 69mcg/ml, subtherapeutic. WIll adjust dose. Patient is improving and repeat BCXs are negative   Plan: Increase Vancomycin 1500 mg IV every 24 hours. Monitor labs, c/s, and further antibiotics.  Height: 5\' 4"  (162.6 cm) Weight: 44 kg (97 lb) IBW/kg (Calculated) : 54.7  Temp (24hrs), Avg:98.5 F (36.9 C), Min:98 F (36.7 C), Max:98.9 F (37.2 C)  Recent Labs  Lab 03/13/20 1020 03/13/20 1122 03/13/20 1349 03/14/20 0544 03/15/20 0616 03/16/20 0500 03/17/20 0500 03/18/20 1509  WBC 32.8*  --   --  21.6* 13.6* 15.1* 15.3*  --   CREATININE 0.64  --   --  0.41* 0.39* 0.34* 0.36*  --   LATICACIDVEN  --  1.6 1.5  --   --   --   --   --   VANCOTROUGH  --   --   --   --   --   --   --  <4*    Estimated Creatinine Clearance: 50 mL/min (A) (by C-G formula based on SCr of 0.36 mg/dL (L)).    Allergies  Allergen Reactions  . Darvon [Propoxyphene] Other (See Comments)    Hallicuations  . Oxycodone-Acetaminophen     Does not have any affect on her  . Penicillins Rash    Did it involve swelling of the face/tongue/throat, SOB, or low BP? Unknown Did it involve sudden or severe rash/hives, skin peeling, or any reaction on the inside of your mouth or nose? Unknown Did you need to seek medical attention at a hospital or doctor's office? Unknown When did it last happen?Unknown If all above answers are "NO", may proceed with cephalosporin use.     Antimicrobials this admission: Vanco 1/13 >>  Cefepime 1/13 x 1 Flagyl 1/13>> cipro 1/15 >>  Rocephin 1/13>> 1/14  Microbiology results: 1/13 BCx: MRSA 1/14 BCX: ngtd 1/13 UCx: multiple species suggest recollection 1/13 Covid/flu: negative  Thank you for allowing pharmacy to be a part of this patient's care.  Isac Sarna, BS [place],  [place] Clinical Pharmacist Pager [phone] 03/18/2020 4:54 PM

## 2020-03-18 NOTE — TOC Progression Note (Signed)
Transition of Care [facility]) - Progression Note    Patient Details  Name: Faith [name] MRN: [id number] Date of Birth: 12-19-1955  [facility] [place] CM/SW Contact  Boneta Lucks, RN Phone Number: 03/18/2020, 12:28 PM  Clinical Narrative:   Resent to SNF, offering a LOG. TOC to follow, Patient is medically ready for discharge.    Expected Discharge Plan: [place] Barriers to Discharge: Continued Medical Work up  Expected Discharge Plan and Services Expected Discharge Plan: [place] In-house Referral: Clinical Social Work   Post Acute Care Choice: [place] Living arrangements for the past 2 months: Single Family Home                 DME Arranged: N/A

## 2020-03-18 NOTE — Progress Notes (Signed)
PROGRESS NOTE    Faith [name]  [id number] DOB: [birth year] DOA: 03/13/2020 PCP: Doree Albee, MD   Brief Narrative:  Faith Bailiff Brownis a 65 y.o.femalewith medical history significant formultiple sclerosis, COPD, pressure ulcer, hypertension. Patient was brought to the ED via EMS with complaints of fall, and family not being able to take care of her.Patient was admitted with SIRS criteria and decubitus ulcer with concern for need for debridement. She has had some worsening weakness and falls. She is now noted to have MRSA bacteremia.She hasbeen started on ciprofloxacin and Flagyl for empiric treatment of appendicitisper GS.  Patient has undergone transesophageal echocardiogram on 1/17 with no findings of vegetations.  ID recommends 4 weeks of IV antibiotics.  PICC line placed 1/17. SNF placement pending.  Assessment & Plan:   Principal Problem:   Decubital ulcer Active Problems:   Falls frequently   Multiple sclerosis (HCC)   SIRS (systemic inflammatory response syndrome) (HCC)   Sacral decubitus ulcer, stage II (HCC)   Acute appendicitis   Bacteremia  MRSA bacteremia -Decubitus ulcer noted, but no other obvious source -Continue vancomycin for total 4 weeks per ID; OPAT placed 1/18 -Repeat blood culturesnegative -2D echocardiogram TTE performed without any signs of vegetations. LVEF 60 to 65% -TEE performed on 1/17 with no vegetations -No new back pain -PICC line placed to R UE on 1/17  Right buttock decubitus ulcer -Appears to be mostly stage II with no need for debridement per general surgery -Continuediet -IV morphine as needed for pain control  Questionable appendicitis -Without significant abdominal pain/N/V at this time; no need for surgical intervention -Cipro and Flagyl started per GS continue for total of 1 week -Serial abdominal exams per GS with outpatient follow-up  Multiple sclerosis -Multiple falls which may be related -PT evaluation  recommending SNF which patient is agreeable to -Continue home pain medications  Hypertension -Propranolol  DVT prophylaxis:SCDs Code Status:Full Family Communication:Discussed with daughter on the phone on 1/18 Disposition Plan: Status is: Inpatient  Remains inpatient appropriate because:IV treatments appropriate due to intensity of illness or inability to take PO and Inpatient level of care appropriate due to severity of illness   Dispo: The patient is from:Home Anticipated d/c is to:SNF Anticipated d/c date is:1-2 days Patient currently is medically stable to d/c. SNF placement pending. Continue antibiotics as ordered.   Consultants:  General surgery  Cardiology for TEE  Discussed with ID 1/17  Procedures:  See below   Nutritional Assessment:  The patient's BMI is: Body mass index is 16.65 kg/m.Marland Kitchen  Skin Assessment:  I have examined the patient's skin and I agree with the wound assessment as performed by the wound care RN as outlined below:  Pressure Injury 03/13/20 Sacrum Medial Deep Tissue Pressure Injury - Purple or maroon localized area of discolored intact skin or blood-filled blister due to damage of underlying soft tissue from pressure and/or shear. 6.5 cm x 5.5 cm (Active)  03/13/20 1800  Location: Sacrum  Location Orientation: Medial  Staging: Deep Tissue Pressure Injury - Purple or maroon localized area of discolored intact skin or blood-filled blister due to damage of underlying soft tissue from pressure and/or shear.  Wound Description (Comments): 6.5 cm x 5.5 cm  Present on Admission: Yes     Pressure Injury 03/13/20 Heel Left;Medial Stage 1 -  Intact skin with non-blanchable redness of a localized area usually over a bony prominence. 3 cm x 2.5 cm (Active)  03/13/20 1800  Location: Heel  Location Orientation: Left;Medial  Staging: Stage 1 -  Intact skin with non-blanchable redness of a  localized area usually over a bony prominence.  Wound Description (Comments): 3 cm x 2.5 cm  Present on Admission: Yes    Antimicrobials:  Anti-infectives (From admission, onward)   Start     Dose/Rate Route Frequency Ordered Stop   03/15/20 1300  ciprofloxacin (CIPRO) IVPB 400 mg        400 mg 200 mL/hr over 60 Minutes Intravenous Every 12 hours 03/15/20 1212     03/15/20 1300  metroNIDAZOLE (FLAGYL) IVPB 500 mg        500 mg 100 mL/hr over 60 Minutes Intravenous Every 8 hours 03/15/20 1212     03/14/20 1445  vancomycin (VANCOREADY) IVPB 750 mg/150 mL        750 mg 150 mL/hr over 60 Minutes Intravenous Every 24 hours 03/14/20 1358     03/14/20 1200  vancomycin (VANCOREADY) IVPB 750 mg/150 mL  Status:  Discontinued        750 mg 150 mL/hr over 60 Minutes Intravenous Every 24 hours 03/13/20 1253 03/13/20 1803   03/13/20 2300  cefTRIAXone (ROCEPHIN) 1 g in sodium chloride 0.9 % 100 mL IVPB  Status:  Discontinued        1 g 200 mL/hr over 30 Minutes Intravenous Every 24 hours 03/13/20 1803 03/14/20 1516   03/13/20 1145  ceFEPIme (MAXIPIME) 2 g in sodium chloride 0.9 % 100 mL IVPB        2 g 200 mL/hr over 30 Minutes Intravenous  Once 03/13/20 1130 03/13/20 1313   03/13/20 1145  metroNIDAZOLE (FLAGYL) IVPB 500 mg        500 mg 100 mL/hr over 60 Minutes Intravenous  Once 03/13/20 1130 03/13/20 1311   03/13/20 1115  levofloxacin (LEVAQUIN) IVPB 750 mg  Status:  Discontinued        750 mg 100 mL/hr over 90 Minutes Intravenous  Once 03/13/20 1109 03/13/20 1127   03/13/20 1115  aztreonam (AZACTAM) 2 g in sodium chloride 0.9 % 100 mL IVPB  Status:  Discontinued        2 g 200 mL/hr over 30 Minutes Intravenous  Once 03/13/20 1109 03/13/20 1132   03/13/20 1115  vancomycin (VANCOCIN) IVPB 1000 mg/200 mL premix        1,000 mg 200 mL/hr over 60 Minutes Intravenous  Once 03/13/20 1109 03/13/20 1307       Subjective: Patient seen and evaluated today with no new acute complaints or  concerns. No acute concerns or events noted overnight.  Objective: Vitals:   03/17/20 1355 03/17/20 2004 03/18/20 0614 03/18/20 1035  BP: 120/81 125/90 (!) 141/86 128/78  Pulse: 69 76 68 71  Resp: 19 18 19 18   Temp: 97.9 F (36.6 C) 98.9 F (37.2 C) 98 F (36.7 C) 98.4 F (36.9 C)  TempSrc: Oral   Oral  SpO2: 97% 98% 98% 96%  Weight:      Height:        Intake/Output Summary (Last 24 hours) at 03/18/2020 1151 Last data filed at 03/18/2020 0500 Gross per 24 hour  Intake 900 ml  Output 1450 ml  Net -550 ml   Filed Weights   03/13/20 1000 03/13/20 1755  Weight: 45.8 kg 44 kg    Examination:  General exam: Appears calm and comfortable  Respiratory system: Clear to auscultation. Respiratory effort normal. Cardiovascular system: S1 & S2 heard, RRR.  Gastrointestinal system: Abdomen is soft Central nervous system: Alert and awake  Extremities: No edema Skin: Decubitus ulcer changes as previously documented Psychiatry: Flat affect.    Data Reviewed: I have personally reviewed following labs and imaging studies  CBC: Recent Labs  Lab 03/13/20 1020 03/14/20 0544 03/15/20 0616 03/16/20 0500 03/17/20 0500  WBC 32.8* 21.6* 13.6* 15.1* 15.3*  NEUTROABS 29.3*  --   --   --   --   HGB 13.5 10.7* 10.3* 11.1* 12.1  HCT 38.7 31.7* 31.0* 32.5* 35.1*  MCV 96.8 97.8 100.0 96.7 98.0  PLT 283 267 279 288 0000000   Basic Metabolic Panel: Recent Labs  Lab 03/13/20 1020 03/14/20 0544 03/15/20 0616 03/16/20 0500 03/17/20 0500  NA 133* 137 137 134* 134*  K 3.2* 3.0* 3.6 2.9* 4.4  CL 98 108 107 104 102  CO2 24 21* 25 23 22   GLUCOSE 141* 84 97 98 86  BUN 11 10 13  6* 5*  CREATININE 0.64 0.41* 0.39* 0.34* 0.36*  CALCIUM 8.4* 7.7* 7.8* 7.5* 7.7*  MG  --  1.7 1.6* 1.6* 1.8   GFR: Estimated Creatinine Clearance: 50 mL/min (A) (by C-G formula based on SCr of 0.36 mg/dL (L)). Liver Function Tests: Recent Labs  Lab 03/13/20 1020  AST 19  ALT 21  ALKPHOS 78  BILITOT 1.5*   PROT 6.2*  ALBUMIN 3.0*   No results for input(s): LIPASE, AMYLASE in the last 168 hours. No results for input(s): AMMONIA in the last 168 hours. Coagulation Profile: Recent Labs  Lab 03/13/20 1020  INR 1.1   Cardiac Enzymes: No results for input(s): CKTOTAL, CKMB, CKMBINDEX, TROPONINI in the last 168 hours. BNP (last 3 results) No results for input(s): PROBNP in the last 8760 hours. HbA1C: No results for input(s): HGBA1C in the last 72 hours. CBG: No results for input(s): GLUCAP in the last 168 hours. Lipid Profile: No results for input(s): CHOL, HDL, LDLCALC, TRIG, CHOLHDL, LDLDIRECT in the last 72 hours. Thyroid Function Tests: No results for input(s): TSH, T4TOTAL, FREET4, T3FREE, THYROIDAB in the last 72 hours. Anemia Panel: No results for input(s): VITAMINB12, FOLATE, FERRITIN, TIBC, IRON, RETICCTPCT in the last 72 hours. Sepsis Labs: Recent Labs  Lab 03/13/20 1122 03/13/20 1349  LATICACIDVEN 1.6 1.5    Recent Results (from the past 240 hour(s))  Urine culture     Status: Abnormal   Collection Time: 03/13/20 10:25 AM   Specimen: In/Out Cath Urine  Result Value Ref Range Status   Specimen Description   Final    IN/OUT CATH URINE Performed at [facility], [address], [place], [place] [address]    Special Requests   Final    NONE Performed at [facility], [address], [place], [place] [address]    Culture MULTIPLE SPECIES PRESENT, SUGGEST RECOLLECTION (A)  Final   Report Status 03/15/2020 FINAL  Final  SARS CORONAVIRUS 2 (TAT 6-24 HRS) Nasopharyngeal Nasopharyngeal Swab     Status: None   Collection Time: 03/13/20 11:07 AM   Specimen: Nasopharyngeal Swab  Result Value Ref Range Status   SARS Coronavirus 2 NEGATIVE NEGATIVE Final    Comment: (NOTE) SARS-CoV-2 target nucleic acids are NOT DETECTED.  The SARS-CoV-2 RNA is generally detectable in upper and lower respiratory specimens during the acute phase of infection. Negative results do not  preclude SARS-CoV-2 infection, do not rule out co-infections with other pathogens, and should not be used as the sole basis for treatment or other patient management decisions. Negative results must be combined with clinical observations, patient history, and epidemiological information. The  expected result is Negative.  Fact Sheet for Patients: HairSlick.no  Fact Sheet for Healthcare Providers: quierodirigir.com  This test is not yet approved or cleared by the [place] FDA and  has been authorized for detection and/or diagnosis of SARS-CoV-2 by FDA under an Emergency Use Authorization (EUA). This EUA will remain  in effect (meaning this test can be used) for the duration of the COVID-19 declaration under Se ction 564(b)(1) of the Act, 21 U.S.C. section 360bbb-3(b)(1), unless the authorization is terminated or revoked sooner.  Performed at [facility], [address], [place], [place] [address]   Blood culture (routine single)     Status: Abnormal   Collection Time: 03/13/20 11:22 AM   Specimen: BLOOD  Result Value Ref Range Status   Specimen Description   Final    BLOOD RIGHT ANTECUBITAL Performed at [facility], [address], [place], [place] [address]    Special Requests   Final    Blood Culture results may not be optimal due to an excessive volume of blood received in culture bottles BOTTLES DRAWN AEROBIC AND ANAEROBIC Performed at [facility], [address] [place], [place] [address]    Culture  Setup Time   Final    GRAM POSITIVE COCCI IN CLUSTERS AEROBIC BOTTLE ONLY Organism ID to follow CRITICAL RESULT CALLED TO, READ BACK BY AND VERIFIED WITHErmalene Searing PHARMD 1425 03/14/20 A BROWNING Performed at [facility], [address], [place], [place] [address]    Culture METHICILLIN RESISTANT STAPHYLOCOCCUS AUREUS (A)  Final   Report Status 03/16/2020 FINAL  Final   Organism ID, Bacteria METHICILLIN  RESISTANT STAPHYLOCOCCUS AUREUS  Final      Susceptibility   Methicillin resistant staphylococcus aureus - MIC*    CIPROFLOXACIN >=8 RESISTANT Resistant     ERYTHROMYCIN <=0.25 SENSITIVE Sensitive     GENTAMICIN <=0.5 SENSITIVE Sensitive     OXACILLIN >=4 RESISTANT Resistant     TETRACYCLINE <=1 SENSITIVE Sensitive     VANCOMYCIN 1 SENSITIVE Sensitive     TRIMETH/SULFA <=10 SENSITIVE Sensitive     CLINDAMYCIN <=0.25 SENSITIVE Sensitive     RIFAMPIN <=0.5 SENSITIVE Sensitive     Inducible Clindamycin NEGATIVE Sensitive     * METHICILLIN RESISTANT STAPHYLOCOCCUS AUREUS  Blood Culture ID Panel (Reflexed)     Status: Abnormal   Collection Time: 03/13/20 11:22 AM  Result Value Ref Range Status   Enterococcus faecalis NOT DETECTED NOT DETECTED Final   Enterococcus Faecium NOT DETECTED NOT DETECTED Final   Listeria monocytogenes NOT DETECTED NOT DETECTED Final   Staphylococcus species DETECTED (A) NOT DETECTED Final    Comment: CRITICAL RESULT CALLED TO, READ BACK BY AND VERIFIED WITHErmalene Searing PHARMD 1425 03/14/20 A BROWNING    Staphylococcus aureus (BCID) DETECTED (A) NOT DETECTED Final    Comment: Methicillin (oxacillin)-resistant Staphylococcus aureus (MRSA). MRSA is predictably resistant to beta-lactam antibiotics (except ceftaroline). Preferred therapy is vancomycin unless clinically contraindicated. Patient requires contact precautions if  hospitalized. CRITICAL RESULT CALLED TO, READ BACK BY AND VERIFIED WITH: Ermalene Searing PHARMD 1425 03/14/20 A BROWNING    Staphylococcus epidermidis NOT DETECTED NOT DETECTED Final   Staphylococcus lugdunensis NOT DETECTED NOT DETECTED Final   Streptococcus species NOT DETECTED NOT DETECTED Final   Streptococcus agalactiae NOT DETECTED NOT DETECTED Final   Streptococcus pneumoniae NOT DETECTED NOT DETECTED Final   Streptococcus pyogenes NOT DETECTED NOT DETECTED Final   A.calcoaceticus-baumannii NOT DETECTED NOT DETECTED Final   Bacteroides fragilis NOT  DETECTED NOT DETECTED Final   Enterobacterales NOT DETECTED NOT DETECTED Final   Enterobacter cloacae complex NOT DETECTED NOT DETECTED Final   Escherichia coli NOT DETECTED NOT DETECTED Final   Klebsiella aerogenes NOT DETECTED NOT DETECTED Final   Klebsiella oxytoca NOT DETECTED NOT DETECTED Final   Klebsiella pneumoniae NOT DETECTED NOT DETECTED Final   Proteus species NOT DETECTED NOT DETECTED Final   Salmonella species NOT DETECTED NOT DETECTED Final   Serratia marcescens NOT DETECTED NOT DETECTED Final   Haemophilus influenzae NOT DETECTED NOT DETECTED Final   Neisseria meningitidis NOT DETECTED NOT DETECTED Final   Pseudomonas aeruginosa NOT DETECTED NOT DETECTED Final   Stenotrophomonas maltophilia NOT DETECTED NOT DETECTED Final   Candida albicans NOT DETECTED NOT DETECTED Final   Candida auris NOT DETECTED NOT DETECTED Final   Candida glabrata NOT DETECTED NOT DETECTED Final   Candida krusei NOT DETECTED NOT DETECTED Final   Candida parapsilosis NOT DETECTED NOT DETECTED Final   Candida tropicalis NOT DETECTED NOT DETECTED Final   Cryptococcus neoformans/gattii NOT DETECTED NOT DETECTED Final   Meth resistant mecA/C and MREJ DETECTED (A) NOT DETECTED Final    Comment: CRITICAL RESULT CALLED TO, READ BACK BY AND VERIFIED WITHGloris Manchester PHARMD 1425 03/14/20 A BROWNING Performed at [facility], [address], [place], [place] [address]   Resp Panel by RT-PCR (Flu A&B, Covid) Nasopharyngeal Swab     Status: None   Collection Time: 03/13/20  2:24 PM   Specimen: Nasopharyngeal Swab; Nasopharyngeal(NP) swabs in vial transport medium  Result Value Ref Range Status   SARS Coronavirus 2 by RT PCR NEGATIVE NEGATIVE Final    Comment: (NOTE) SARS-CoV-2 target nucleic acids are NOT DETECTED.  The SARS-CoV-2 RNA is generally detectable in upper respiratory specimens during the acute phase of infection. The lowest concentration of SARS-CoV-2 viral copies this assay can detect  is 138 copies/mL. A negative result does not preclude SARS-Cov-2 infection and should not be used as the sole basis for treatment or other patient management decisions. A negative result may occur with  improper specimen collection/handling, submission of specimen other than nasopharyngeal swab, presence of viral mutation(s) within the areas targeted by this assay, and inadequate number of viral copies(<138 copies/mL). A negative result must be combined with clinical observations, patient history, and epidemiological information. The expected result is Negative.  Fact Sheet for Patients:  EntrepreneurPulse.com.au  Fact Sheet for Healthcare Providers:  IncredibleEmployment.be  This test is no t yet approved or cleared by the [place] FDA and  has been authorized for detection and/or diagnosis of SARS-CoV-2 by FDA under an Emergency Use Authorization (EUA). This EUA will remain  in effect (meaning this test can be used) for the duration of the COVID-19 declaration under Section 564(b)(1) of the Act, 21 U.S.C.section 360bbb-3(b)(1), unless the authorization is terminated  or revoked sooner.       Influenza A by PCR NEGATIVE NEGATIVE Final   Influenza B by PCR NEGATIVE NEGATIVE Final    Comment: (NOTE) The Xpert Xpress SARS-CoV-2/FLU/RSV plus assay is intended as an aid in the diagnosis of influenza from Nasopharyngeal swab specimens and should not be used as a sole basis for treatment. Nasal washings and aspirates are unacceptable for Xpert Xpress SARS-CoV-2/FLU/RSV testing.  Fact Sheet for Patients: EntrepreneurPulse.com.au  Fact Sheet for Healthcare Providers: IncredibleEmployment.be  This test is not yet approved or cleared by the [place] FDA and has been authorized for detection and/or diagnosis of SARS-CoV-2 by FDA  under an Emergency Use Authorization (EUA). This EUA will remain in effect  (meaning this test can be used) for the duration of the COVID-19 declaration under Section 564(b)(1) of the Act, 21 U.S.C. section 360bbb-3(b)(1), unless the authorization is terminated or revoked.  Performed at [facility], [address], [place], [place] [address]   MRSA PCR Screening     Status: None   Collection Time: 03/14/20  2:01 PM   Specimen: Nasal Mucosa; Nasopharyngeal  Result Value Ref Range Status   MRSA by PCR NEGATIVE NEGATIVE Final    Comment:        The GeneXpert MRSA Assay (FDA approved for NASAL specimens only), is one component of a comprehensive MRSA colonization surveillance program. It is not intended to diagnose MRSA infection nor to guide or monitor treatment for MRSA infections. Performed at [facility], [address] [place], [place] [address]   Culture, blood (routine x 2)     Status: None (Preliminary result)   Collection Time: 03/14/20  2:05 PM   Specimen: BLOOD  Result Value Ref Range Status   Specimen Description BLOOD LEFT ANTECUBITAL  Final   Special Requests   Final    Blood Culture adequate volume BOTTLES DRAWN AEROBIC AND ANAEROBIC   Culture   Final    NO GROWTH 4 DAYS Performed at [facility], [address] [place], [place] [address]    Report Status PENDING  Incomplete  Culture, blood (routine x 2)     Status: None (Preliminary result)   Collection Time: 03/14/20  2:09 PM   Specimen: BLOOD  Result Value Ref Range Status   Specimen Description BLOOD BLOOD LEFT HAND  Final   Special Requests   Final    Blood Culture adequate volume BOTTLES DRAWN AEROBIC AND ANAEROBIC   Culture   Final    NO GROWTH 4 DAYS Performed at [facility], [address] [place], [place] [address]    Report Status PENDING  Incomplete         Radiology Studies: ECHO TEE  Result Date: 03/17/2020    TRANSESOPHOGEAL ECHO REPORT   Patient Name:   Jackelyn Knife Date of Exam: 03/17/2020 Medical Rec #:  [id number]    Height:       64.0 in Accession #:     [id number]   Weight:       97.0 lb Date of Birth:  [birth year]    BSA:          1.438 m Patient Age:    62 years     BP:           113/89 mmHg Patient Gender: F            HR:           75 bpm. Exam Location:  [facility] Procedure: Transesophageal Echo, Cardiac Doppler and Color Doppler Indications:    Bacteremia 790.7 / R78.81  History:        Patient has prior history of Echocardiogram examinations, most                 recent 03/16/2019. COPD; Risk Factors:Hypertension, Dyslipidemia                 and Current Smoker. Multiple sclerosis.  Sonographer:    Alvino Chapel RCS Referring Phys: [place]: TEE procedure time was 8 minutes. The transesophogeal probe was passed without difficulty through the esophogus of the patient. Imaged were obtained with the  patient in a left lateral decubitus position. Sedation performed by different physician. The patient was monitored while under deep sedation. Image quality was good. The patient developed no complications during the procedure. IMPRESSIONS  1. Left ventricular ejection fraction, by estimation, is 65 to 70%. The left ventricle has normal function. The left ventricle has no regional wall motion abnormalities.  2. Right ventricular systolic function is normal. The right ventricular size is normal.  3. No left atrial/left atrial appendage thrombus was detected. The LAA emptying velocity was 60 cm/s.  4. The mitral valve is grossly normal. Mild mitral valve regurgitation.  5. Tricuspid valve regurgitation is mild to moderate.  6. The aortic valve is tricuspid. Aortic valve regurgitation is not visualized.  7. There is Moderate (Grade III) atheroma plaque involving the descending aorta.  8. Evidence of small PFO with trivial degree of left to right shunting.  9. No obvious valvular vegetations noted. FINDINGS  Left Ventricle: Left ventricular ejection fraction, by estimation, is 65 to 70%. The left ventricle has normal function. The left ventricle  has no regional wall motion abnormalities. The left ventricular internal cavity size was normal in size. Right Ventricle: The right ventricular size is normal. No increase in right ventricular wall thickness. Right ventricular systolic function is normal. Left Atrium: Left atrial size was normal in size. No left atrial/left atrial appendage thrombus was detected. The LAA emptying velocity was 60 cm/s. Right Atrium: Right atrial size was normal in size. Pericardium: There is no evidence of pericardial effusion. Mitral Valve: The mitral valve is grossly normal. Mild mitral valve regurgitation. Tricuspid Valve: The tricuspid valve is grossly normal. Tricuspid valve regurgitation is mild to moderate. Aortic Valve: The aortic valve is tricuspid. Aortic valve regurgitation is not visualized. Pulmonic Valve: The pulmonic valve was grossly normal. Pulmonic valve regurgitation is mild. Aorta: The aortic root is normal in size and structure. There is moderate (Grade III) atheroma plaque involving the descending aorta. IAS/Shunts: Evidence of small PFO with trivial degree of left to right shunting. Rozann Lesches MD Electronically signed by Rozann Lesches MD Signature Date/Time: 03/17/2020/12:59:45 PM    Final    [place] EKG SITE RITE  Result Date: 03/17/2020 If Site Rite image not attached, placement could not be confirmed due to current cardiac rhythm.       Scheduled Meds: . atorvastatin  20 mg Oral Daily  . baclofen  10 mg Oral TID  . celecoxib  200 mg Oral BID  . Chlorhexidine Gluconate Cloth  6 each Topical Daily  . collagenase   Topical Daily  . gabapentin  600 mg Oral TID  . propranolol  20 mg Oral BID   Continuous Infusions: . ciprofloxacin 400 mg (03/18/20 0200)  . metronidazole 500 mg (03/17/20 2120)  . vancomycin 750 mg (03/17/20 1805)     LOS: 5 days    Time spent: 35 minutes    Ziara Thelander D Manuella Ghazi, DO [facility]  If 7PM-7AM, please contact  night-coverage [email or web address] 03/18/2020, 11:51 AM

## 2020-03-18 NOTE — Progress Notes (Signed)
PHARMACY CONSULT NOTE FOR:  OUTPATIENT  PARENTERAL ANTIBIOTIC THERAPY (OPAT)  Indication: MRSA Bacteremia Regimen: Vancomycin 750mg  IV q24h End date: 04/10/2020  IV antibiotic discharge orders are pended. To discharging provider:  please sign these orders via discharge navigator,  Select New Orders & click on the button choice - Manage This Unsigned Work.     Thank you for allowing pharmacy to be a part of this patient's care.  Isac Sarna, BS [place]ena [place], BCPS Clinical Pharmacist Pager [phone] 03/18/2020, 9:22 AM

## 2020-03-19 ENCOUNTER — Ambulatory Visit (INDEPENDENT_AMBULATORY_CARE_PROVIDER_SITE_OTHER): Payer: Medicaid Other | Admitting: Nurse Practitioner

## 2020-03-19 DIAGNOSIS — G35 Multiple sclerosis: Secondary | ICD-10-CM

## 2020-03-19 DIAGNOSIS — R7881 Bacteremia: Secondary | ICD-10-CM

## 2020-03-19 DIAGNOSIS — K3580 Unspecified acute appendicitis: Secondary | ICD-10-CM

## 2020-03-19 DIAGNOSIS — L89152 Pressure ulcer of sacral region, stage 2: Secondary | ICD-10-CM | POA: Diagnosis not present

## 2020-03-19 LAB — CBC
HCT: 33.6 % — ABNORMAL LOW (ref 36.0–46.0)
Hemoglobin: 11.5 g/dL — ABNORMAL LOW (ref 12.0–15.0)
MCH: 33.7 pg (ref 26.0–34.0)
MCHC: 34.2 g/dL (ref 30.0–36.0)
MCV: 98.5 fL (ref 80.0–100.0)
Platelets: 363 10*3/uL (ref 150–400)
RBC: 3.41 MIL/uL — ABNORMAL LOW (ref 3.87–5.11)
RDW: 13 % (ref 11.5–15.5)
WBC: 10.1 10*3/uL (ref 4.0–10.5)
nRBC: 0 % (ref 0.0–0.2)

## 2020-03-19 LAB — BASIC METABOLIC PANEL
Anion gap: 8 (ref 5–15)
BUN: 9 mg/dL (ref 8–23)
CO2: 26 mmol/L (ref 22–32)
Calcium: 7.9 mg/dL — ABNORMAL LOW (ref 8.9–10.3)
Chloride: 102 mmol/L (ref 98–111)
Creatinine, Ser: 0.39 mg/dL — ABNORMAL LOW (ref 0.44–1.00)
GFR, Estimated: >60 mL/min (ref >60)
Glucose, Bld: 100 mg/dL — ABNORMAL HIGH (ref 70–99)
Potassium: 3.2 mmol/L — ABNORMAL LOW (ref 3.5–5.1)
Sodium: 136 mmol/L (ref 135–145)

## 2020-03-19 LAB — CULTURE, BLOOD (ROUTINE X 2)
Culture: NO GROWTH
Culture: NO GROWTH
Special Requests: ADEQUATE
Special Requests: ADEQUATE

## 2020-03-19 LAB — MAGNESIUM: Magnesium: 1.5 mg/dL — ABNORMAL LOW (ref 1.7–2.4)

## 2020-03-19 MED ORDER — CIPROFLOXACIN HCL 250 MG PO TABS: 500.0000 mg | ORAL_TABLET | Freq: Two times a day (BID) | ORAL | Status: DC

## 2020-03-19 MED ORDER — POTASSIUM CHLORIDE CRYS ER 20 MEQ PO TBCR
60.0000 meq | EXTENDED_RELEASE_TABLET | Freq: Once | ORAL | Status: AC
  Administered 2020-03-19: 60 meq via ORAL
  Filled 2020-03-19: qty 3

## 2020-03-19 MED ORDER — METRONIDAZOLE 500 MG PO TABS
500.0000 mg | ORAL_TABLET | Freq: Three times a day (TID) | ORAL | Status: AC
  Administered 2020-03-19 – 2020-03-21 (×7): 500 mg via ORAL
  Filled 2020-03-19 (×8): qty 1

## 2020-03-19 MED ORDER — CIPROFLOXACIN HCL 250 MG PO TABS
500.0000 mg | ORAL_TABLET | Freq: Two times a day (BID) | ORAL | Status: AC
  Administered 2020-03-19 – 2020-03-21 (×6): 500 mg via ORAL
  Filled 2020-03-19 (×7): qty 2

## 2020-03-19 MED ORDER — METRONIDAZOLE 500 MG PO TABS: 500.0000 mg | ORAL_TABLET | Freq: Three times a day (TID) | ORAL | Status: DC

## 2020-03-19 MED ORDER — MAGNESIUM SULFATE 4 GM/100ML IV SOLN
4.0000 g | Freq: Once | INTRAVENOUS | Status: AC
  Administered 2020-03-19: 4 g via INTRAVENOUS
  Filled 2020-03-19: qty 100

## 2020-03-19 NOTE — Progress Notes (Signed)
Physical Therapy Treatment Patient Details Name: Faith [name] MRN: [id number] DOB: 1956/02/13 Today's Date: 03/19/2020    History of Present Illness Faith [name] is a 65 y.o. female with medical history significant for multiple sclerosis, COPD, pressure ulcer, hypertension.  Patient was brought to the ED via EMS with complaints of fall, and family not being able to take care of her.  My evaluation patient is awake and alert and able to give me detailed history.  Her knees have just been giving way.  She ambulates with a motorized wheelchair and a walker.  She did not hit her head.  Patient was in the ED yesterday for fall and right shoulder pain, x-ray showed moderate osteoarthritic change she was discharged home.  Family are unable to take care of patient.  She has had a buttock ulcer for a while.  She reports pain from the area.  She would like to be discharged to rehab facility after this hospitalization.  She denies pain with urination, but she reports some urinary frequency, no chest pain, no cough, no difficulty breathing, no vomiting no loose stools no abdominal pain.    PT Comments    Pt willing to participate with therapy today.  REquired mod assist to transfer to EOB, mostly assist with LE's as pt is weak.  Pt able to maintain sitting balance after assisted to get positioned.  Pt then had to have BM so transferred to [facility] Pt then stood and took 5 steps to recliner.  Very weak requiring mod assist with gait and use of RW.  Pt positionined in chair with chair alarm in place.  Nursing notified of need to replace pure wick.   Follow Up Recommendations        Equipment Recommendations       Recommendations for Other Services       Precautions / Restrictions Precautions Precautions: Fall Restrictions Weight Bearing Restrictions: No    Mobility  Bed Mobility Overal bed mobility: Needs Assistance Bed Mobility: Supine to Sit     Supine to sit: Mod assist     General bed mobility  comments: Patient requires assist and use of bed rails for rolling, transitions to seated with min/mod assist for LE movement and uprighting trunk, assist for LE back into bed  Transfers Overall transfer level: Needs assistance Equipment used: Rolling walker (2 wheeled) Transfers: Sit to/from Stand Sit to Stand: Mod assist         General transfer comment: transfer to standing using RW and cueing for sequencing, assist for LE weakness  Ambulation/Gait Ambulation/Gait assistance: Min assist Gait Distance (Feet): 5 Feet Assistive device: Rolling walker (2 wheeled) Gait Pattern/deviations: Step-through pattern;Decreased dorsiflexion - right;Decreased dorsiflexion - left;Shuffle;Trunk flexed Gait velocity: decreased   General Gait Details: limited ambulation using RW to transfer to commode, chair and bed   Stairs             Wheelchair Mobility    Modified Rankin (Stroke Patients Only)       Balance                                            Cognition Arousal/Alertness: Awake/alert Behavior During Therapy: WFL for tasks assessed/performed Overall Cognitive Status: Within Functional Limits for tasks assessed  General Comments: No obvious cognitive impairments, may be a tad slow to process.      Exercises      General Comments        Pertinent Vitals/Pain Pain Assessment: Faces Faces Pain Scale: Hurts a little bit Pain Location: Rt shoulder, back and buttock Pain Descriptors / Indicators: Grimacing;Guarding    Home Living                      Prior Function            PT Goals (current goals can now be found in the care plan section)      Frequency    Min 3X/week      PT Plan      Co-evaluation              AM-PAC PT "6 Clicks" Mobility   Outcome Measure  Help needed turning from your back to your side while in a flat bed without using bedrails?: A  Little Help needed moving from lying on your back to sitting on the side of a flat bed without using bedrails?: A Lot Help needed moving to and from a bed to a chair (including a wheelchair)?: A Lot Help needed standing up from a chair using your arms (e.g., wheelchair or bedside chair)?: A Lot Help needed to walk in hospital room?: A Lot Help needed climbing 3-5 steps with a railing? : Total 6 Click Score: 12    End of Session Equipment Utilized During Treatment: Gait belt Activity Tolerance: Patient limited by pain;Patient limited by fatigue Patient left: in bed;with call bell/phone within reach;with bed alarm set   PT Visit Diagnosis: Unsteadiness on feet (R26.81);Muscle weakness (generalized) (M62.81);Other abnormalities of gait and mobility (R26.89);Difficulty in walking, not elsewhere classified (R26.2);History of falling (Z91.81)     Time: 5643-3295 PT Time Calculation (min) (ACUTE ONLY): 35 min  Charges:  $Therapeutic Activity: 23-37 mins                     Teena Irani, PTA/CLT [place], Noah Pelaez B 03/19/2020, 4:46 PM

## 2020-03-19 NOTE — TOC Progression Note (Signed)
Transition of Care [facility]) - Progression Note    Patient Details  Name: Faith [name] MRN: [id number] Date of Birth: 01/09/1956  [facility] CM/SW Contact  Boneta Lucks, RN Phone Number: 03/19/2020, 4:53 PM  Clinical Narrative:   TOC  [facility] to review patient chart. They do not accept Medicaid. No bed offers, TOC calling SNF.  Debbie at [facility] can take patient tomorrow. TOC spoke to patient and daughter. They understand there no other offers and agrees she can not go home. MD updated.     Expected Discharge Plan: [place] Barriers to Discharge: Continued Medical Work up  Expected Discharge Plan and Services Expected Discharge Plan: [place] In-house Referral: Clinical Social Work   Post Acute Care Choice: [place] Living arrangements for the past 2 months: Single Family Home                 DME Arranged: N/A      Readmission Risk Interventions Readmission Risk Prevention Plan 03/19/2020  Medication Screening Complete  Transportation Screening Complete  Some recent data might be hidden

## 2020-03-19 NOTE — Progress Notes (Addendum)
PROGRESS NOTE    Faith [name]  [id number] DOB: [birth year] DOA: 03/13/2020 PCP: Doree Albee, MD   Brief Narrative:  Faith Bailiff Brownis a 65 y.o.femalewith medical history significant formultiple sclerosis, COPD, pressure ulcer, hypertension. Patient was brought to the ED via EMS with complaints of fall, and family not being able to take care of her.Patient was admitted with SIRS criteria and decubitus ulcer with concern for need for debridement. She has had some worsening weakness and falls. She is now noted to have MRSA bacteremia.She hasbeen started on ciprofloxacin and Flagyl for empiric treatment of appendicitisper GS.  Patient has undergone transesophageal echocardiogram on 1/17 with no findings of vegetations.  ID recommends 4 weeks of IV antibiotics.  PICC line placed 1/17. SNF placement pending.  Assessment & Plan:   Principal Problem:   Decubital ulcer Active Problems:   Falls frequently   Multiple sclerosis (HCC)   SIRS (systemic inflammatory response syndrome) (HCC)   Sacral decubitus ulcer, stage II (HCC)   Acute appendicitis   Bacteremia  MRSA bacteremia -Decubitus ulcer noted, but no other obvious source -Continue vancomycin for total 4 weeks per ID; OPAT placed 1/18 -Repeat blood culturesnegative -2D echocardiogram TTE performed without any signs of vegetations. LVEF 60 to 65% -TEE performed on 1/17 with no vegetations -No new back pain -PICC line placed to RUE on 0000000 -Plan DC to [facility] SNF [id number]  Right buttock decubitus ulcer -Appears to be mostly stage II with no need for debridement per general surgery -Continuediet -IV morphine as needed for pain control -Continue wound care protocols -Air Mattress recommended  Questionable early appendicitis -Without significant abdominal pain/N/V at this time; no need for surgical intervention -Cipro and Flagyl started per GS continue for total of 1 week -Serial abdominal exams per GS with  outpatient follow-up  Multiple sclerosis -Multiple falls which may be related -PT evaluation recommending SNF which patient is agreeable to -Continue home pain medications  Hypertension -Propranolol  Hypokalemia/Hypomagnesemia - IV replacement and oral replacement given, recheck in AM  DVT prophylaxis:SCDs Code Status:Full Family Communication:Dr Manuella Ghazi discussed with daughter on the phone on 1/18 Disposition Plan: Status is: Inpatient  Remains inpatient appropriate because:IV treatments appropriate due to intensity of illness or inability to take PO and Inpatient level of care appropriate due to severity of illness   Dispo: The patient is from:Home Anticipated d/c is to:SNF Anticipated d/c date is:1 days Patient currently is medically stable to d/c. SNF placement pending. Continue antibiotics as ordered.   Consultants:  General surgery  Cardiology for TEE  Discussed with ID 1/17  Procedures:  See below   Nutritional Assessment:  The patient's BMI is: Body mass index is 16.65 kg/m.Marland Kitchen  Skin Assessment:  I have examined the patient's skin and I agree with the wound assessment as performed by the wound care RN as outlined below:  Pressure Injury 03/13/20 Sacrum Medial Deep Tissue Pressure Injury - Purple or maroon localized area of discolored intact skin or blood-filled blister due to damage of underlying soft tissue from pressure and/or shear. 6.5 cm x 5.5 cm (Active)  03/13/20 1800  Location: Sacrum  Location Orientation: Medial  Staging: Deep Tissue Pressure Injury - Purple or maroon localized area of discolored intact skin or blood-filled blister due to damage of underlying soft tissue from pressure and/or shear.  Wound Description (Comments): 6.5 cm x 5.5 cm  Present on Admission: Yes     Pressure Injury 03/13/20 Heel Left;Medial Stage 1 -  Intact skin with non-blanchable  redness of a localized  area usually over a bony prominence. 3 cm x 2.5 cm (Active)  03/13/20 1800  Location: Heel  Location Orientation: Left;Medial  Staging: Stage 1 -  Intact skin with non-blanchable redness of a localized area usually over a bony prominence.  Wound Description (Comments): 3 cm x 2.5 cm  Present on Admission: Yes    Antimicrobials:  Anti-infectives (From admission, onward)   Start     Dose/Rate Route Frequency Ordered Stop   03/19/20 1400  metroNIDAZOLE (FLAGYL) tablet 500 mg  Status:  Discontinued        500 mg Oral Every 8 hours 03/19/20 1314 03/19/20 1316   03/19/20 1400  metroNIDAZOLE (FLAGYL) tablet 500 mg        500 mg Oral Every 8 hours 03/19/20 1316 03/22/20 0559   03/19/20 1330  ciprofloxacin (CIPRO) tablet 500 mg  Status:  Discontinued        500 mg Oral 2 times daily 03/19/20 1314 03/19/20 1316   03/19/20 1330  ciprofloxacin (CIPRO) tablet 500 mg        500 mg Oral 2 times daily 03/19/20 1316 03/22/20 0759   03/18/20 1730  vancomycin (VANCOREADY) IVPB 1500 mg/300 mL        1,500 mg 150 mL/hr over 120 Minutes Intravenous Every 24 hours 03/18/20 1658     03/15/20 1300  ciprofloxacin (CIPRO) IVPB 400 mg  Status:  Discontinued        400 mg 200 mL/hr over 60 Minutes Intravenous Every 12 hours 03/15/20 1212 03/19/20 1314   03/15/20 1300  metroNIDAZOLE (FLAGYL) IVPB 500 mg  Status:  Discontinued        500 mg 100 mL/hr over 60 Minutes Intravenous Every 8 hours 03/15/20 1212 03/19/20 1314   03/14/20 1445  vancomycin (VANCOREADY) IVPB 750 mg/150 mL  Status:  Discontinued        750 mg 150 mL/hr over 60 Minutes Intravenous Every 24 hours 03/14/20 1358 03/18/20 1658   03/14/20 1200  vancomycin (VANCOREADY) IVPB 750 mg/150 mL  Status:  Discontinued        750 mg 150 mL/hr over 60 Minutes Intravenous Every 24 hours 03/13/20 1253 03/13/20 1803   03/13/20 2300  cefTRIAXone (ROCEPHIN) 1 g in sodium chloride 0.9 % 100 mL IVPB  Status:  Discontinued        1 g 200 mL/hr over 30 Minutes  Intravenous Every 24 hours 03/13/20 1803 03/14/20 1516   03/13/20 1145  ceFEPIme (MAXIPIME) 2 g in sodium chloride 0.9 % 100 mL IVPB        2 g 200 mL/hr over 30 Minutes Intravenous  Once 03/13/20 1130 03/13/20 1313   03/13/20 1145  metroNIDAZOLE (FLAGYL) IVPB 500 mg        500 mg 100 mL/hr over 60 Minutes Intravenous  Once 03/13/20 1130 03/13/20 1311   03/13/20 1115  levofloxacin (LEVAQUIN) IVPB 750 mg  Status:  Discontinued        750 mg 100 mL/hr over 90 Minutes Intravenous  Once 03/13/20 1109 03/13/20 1127   03/13/20 1115  aztreonam (AZACTAM) 2 g in sodium chloride 0.9 % 100 mL IVPB  Status:  Discontinued        2 g 200 mL/hr over 30 Minutes Intravenous  Once 03/13/20 1109 03/13/20 1132   03/13/20 1115  vancomycin (VANCOCIN) IVPB 1000 mg/200 mL premix        1,000 mg 200 mL/hr over 60 Minutes Intravenous  Once 03/13/20 1109 03/13/20 1307  Subjective: Patient seen and evaluated today with no new acute complaints or concerns. No acute concerns or events noted overnight.  Objective: Vitals:   03/18/20 1419 03/18/20 2129 03/19/20 0509 03/19/20 1440  BP: 124/80 133/83 134/77 (!) 111/91  Pulse: 66 68 65 71  Resp: 18 16 18 18   Temp: 98.7 F (37.1 C) (!) 97.5 F (36.4 C) 98 F (36.7 C) 98.4 F (36.9 C)  TempSrc: Oral Oral Oral Oral  SpO2: 97% 99% 98%   Weight:      Height:        Intake/Output Summary (Last 24 hours) at 03/19/2020 1541 Last data filed at 03/19/2020 1300 Gross per 24 hour  Intake 480 ml  Output 2300 ml  Net -1820 ml   Filed Weights   03/13/20 1000 03/13/20 1755  Weight: 45.8 kg 44 kg    Examination:  General exam: Appears calm and comfortable  Respiratory system: Clear to auscultation. Respiratory effort normal. Cardiovascular system: normal S1 & S2 heard, no M/r/g.  Gastrointestinal system: Abdomen is soft Central nervous system: Alert and awake Extremities: No edema Skin: Decubitus ulcer as noted above.  Psychiatry: alert, cooperative,  normal mood.   Data Reviewed: I have personally reviewed following labs and imaging studies  CBC: Recent Labs  Lab 03/13/20 1020 03/14/20 0544 03/15/20 0616 03/16/20 0500 03/17/20 0500 03/19/20 0509  WBC 32.8* 21.6* 13.6* 15.1* 15.3* 10.1  NEUTROABS 29.3*  --   --   --   --   --   HGB 13.5 10.7* 10.3* 11.1* 12.1 11.5*  HCT 38.7 31.7* 31.0* 32.5* 35.1* 33.6*  MCV 96.8 97.8 100.0 96.7 98.0 98.5  PLT 283 267 279 288 333 573   Basic Metabolic Panel: Recent Labs  Lab 03/14/20 0544 03/15/20 0616 03/16/20 0500 03/17/20 0500 03/19/20 0509  NA 137 137 134* 134* 136  K 3.0* 3.6 2.9* 4.4 3.2*  CL 108 107 104 102 102  CO2 21* 25 23 22 26   GLUCOSE 84 97 98 86 100*  BUN 10 13 6* 5* 9  CREATININE 0.41* 0.39* 0.34* 0.36* 0.39*  CALCIUM 7.7* 7.8* 7.5* 7.7* 7.9*  MG 1.7 1.6* 1.6* 1.8 1.5*   GFR: Estimated Creatinine Clearance: 50 mL/min (A) (by C-G formula based on SCr of 0.39 mg/dL (L)). Liver Function Tests: Recent Labs  Lab 03/13/20 1020  AST 19  ALT 21  ALKPHOS 78  BILITOT 1.5*  PROT 6.2*  ALBUMIN 3.0*   No results for input(s): LIPASE, AMYLASE in the last 168 hours. No results for input(s): AMMONIA in the last 168 hours. Coagulation Profile: Recent Labs  Lab 03/13/20 1020  INR 1.1   Cardiac Enzymes: No results for input(s): CKTOTAL, CKMB, CKMBINDEX, TROPONINI in the last 168 hours. BNP (last 3 results) No results for input(s): PROBNP in the last 8760 hours. HbA1C: No results for input(s): HGBA1C in the last 72 hours. CBG: No results for input(s): GLUCAP in the last 168 hours. Lipid Profile: No results for input(s): CHOL, HDL, LDLCALC, TRIG, CHOLHDL, LDLDIRECT in the last 72 hours. Thyroid Function Tests: No results for input(s): TSH, T4TOTAL, FREET4, T3FREE, THYROIDAB in the last 72 hours. Anemia Panel: No results for input(s): VITAMINB12, FOLATE, FERRITIN, TIBC, IRON, RETICCTPCT in the last 72 hours. Sepsis Labs: Recent Labs  Lab 03/13/20 1122  03/13/20 1349  LATICACIDVEN 1.6 1.5    Recent Results (from the past 240 hour(s))  Urine culture     Status: Abnormal   Collection Time: 03/13/20 10:25 AM   Specimen:  In/Out Cath Urine  Result Value Ref Range Status   Specimen Description   Final    IN/OUT CATH URINE Performed at [facility], [address], [place], [place] [address]    Special Requests   Final    NONE Performed at [facility], [address] [place], [place] [address]    Culture MULTIPLE SPECIES PRESENT, SUGGEST RECOLLECTION (A)  Final   Report Status 03/15/2020 FINAL  Final  SARS CORONAVIRUS 2 (TAT 6-24 HRS) Nasopharyngeal Nasopharyngeal Swab     Status: None   Collection Time: 03/13/20 11:07 AM   Specimen: Nasopharyngeal Swab  Result Value Ref Range Status   SARS Coronavirus 2 NEGATIVE NEGATIVE Final    Comment: (NOTE) SARS-CoV-2 target nucleic acids are NOT DETECTED.  The SARS-CoV-2 RNA is generally detectable in upper and lower respiratory specimens during the acute phase of infection. Negative results do not preclude SARS-CoV-2 infection, do not rule out co-infections with other pathogens, and should not be used as the sole basis for treatment or other patient management decisions. Negative results must be combined with clinical observations, patient history, and epidemiological information. The expected result is Negative.  Fact Sheet for Patients: SugarRoll.be  Fact Sheet for Healthcare Providers: https://www.woods-mathews.com/  This test is not yet approved or cleared by the [place] FDA and  has been authorized for detection and/or diagnosis of SARS-CoV-2 by FDA under an Emergency Use Authorization (EUA). This EUA will remain  in effect (meaning this test can be used) for the duration of the COVID-19 declaration under Se ction 564(b)(1) of the Act, 21 U.S.C. section 360bbb-3(b)(1), unless the authorization is terminated or revoked  sooner.  Performed at [facility], [place] [address] [place], [place] [address]   Blood culture (routine single)     Status: Abnormal   Collection Time: 03/13/20 11:22 AM   Specimen: BLOOD  Result Value Ref Range Status   Specimen Description   Final    BLOOD RIGHT ANTECUBITAL Performed at [facility], [address], [place], [place] [address]    Special Requests   Final    Blood Culture results may not be optimal due to an excessive volume of blood received in culture bottles BOTTLES DRAWN AEROBIC AND ANAEROBIC Performed at [facility], [address] [place], [place] [address]    Culture  Setup Time   Final    GRAM POSITIVE COCCI IN CLUSTERS AEROBIC BOTTLE ONLY Organism ID to follow CRITICAL RESULT CALLED TO, READ BACK BY AND VERIFIED WITHGloris Manchester [id number] 03/14/20 A BROWNING Performed at [facility], [place] [address] [place], [place] [address]    Culture METHICILLIN RESISTANT STAPHYLOCOCCUS AUREUS (A)  Final   Report Status 03/16/2020 FINAL  Final   Organism ID, Bacteria METHICILLIN RESISTANT STAPHYLOCOCCUS AUREUS  Final      Susceptibility   Methicillin resistant staphylococcus aureus - MIC*    CIPROFLOXACIN >=8 RESISTANT Resistant     ERYTHROMYCIN <=0.25 SENSITIVE Sensitive     GENTAMICIN <=0.5 SENSITIVE Sensitive     OXACILLIN >=4 RESISTANT Resistant     TETRACYCLINE <=1 SENSITIVE Sensitive     VANCOMYCIN 1 SENSITIVE Sensitive     TRIMETH/SULFA <=10 SENSITIVE Sensitive     CLINDAMYCIN <=0.25 SENSITIVE Sensitive     RIFAMPIN <=0.5 SENSITIVE Sensitive     Inducible Clindamycin NEGATIVE Sensitive     * METHICILLIN RESISTANT STAPHYLOCOCCUS AUREUS  Blood Culture ID Panel (Reflexed)     Status: Abnormal   Collection Time: 03/13/20  11:22 AM  Result Value Ref Range Status   Enterococcus faecalis NOT DETECTED NOT DETECTED Final   Enterococcus Faecium NOT DETECTED NOT DETECTED Final   Listeria monocytogenes NOT DETECTED NOT DETECTED Final   Staphylococcus  species DETECTED (A) NOT DETECTED Final    Comment: CRITICAL RESULT CALLED TO, READ BACK BY AND VERIFIED WITHGloris Manchester PHARMD 1425 03/14/20 A BROWNING    Staphylococcus aureus (BCID) DETECTED (A) NOT DETECTED Final    Comment: Methicillin (oxacillin)-resistant Staphylococcus aureus (MRSA). MRSA is predictably resistant to beta-lactam antibiotics (except ceftaroline). Preferred therapy is vancomycin unless clinically contraindicated. Patient requires contact precautions if  hospitalized. CRITICAL RESULT CALLED TO, READ BACK BY AND VERIFIED WITH: Gloris Manchester PHARMD 1425 03/14/20 A BROWNING    Staphylococcus epidermidis NOT DETECTED NOT DETECTED Final   Staphylococcus lugdunensis NOT DETECTED NOT DETECTED Final   Streptococcus species NOT DETECTED NOT DETECTED Final   Streptococcus agalactiae NOT DETECTED NOT DETECTED Final   Streptococcus pneumoniae NOT DETECTED NOT DETECTED Final   Streptococcus pyogenes NOT DETECTED NOT DETECTED Final   A.calcoaceticus-baumannii NOT DETECTED NOT DETECTED Final   Bacteroides fragilis NOT DETECTED NOT DETECTED Final   Enterobacterales NOT DETECTED NOT DETECTED Final   Enterobacter cloacae complex NOT DETECTED NOT DETECTED Final   Escherichia coli NOT DETECTED NOT DETECTED Final   Klebsiella aerogenes NOT DETECTED NOT DETECTED Final   Klebsiella oxytoca NOT DETECTED NOT DETECTED Final   Klebsiella pneumoniae NOT DETECTED NOT DETECTED Final   Proteus species NOT DETECTED NOT DETECTED Final   Salmonella species NOT DETECTED NOT DETECTED Final   Serratia marcescens NOT DETECTED NOT DETECTED Final   Haemophilus influenzae NOT DETECTED NOT DETECTED Final   Neisseria meningitidis NOT DETECTED NOT DETECTED Final   Pseudomonas aeruginosa NOT DETECTED NOT DETECTED Final   Stenotrophomonas maltophilia NOT DETECTED NOT DETECTED Final   Candida albicans NOT DETECTED NOT DETECTED Final   Candida auris NOT DETECTED NOT DETECTED Final   Candida glabrata NOT DETECTED NOT  DETECTED Final   Candida krusei NOT DETECTED NOT DETECTED Final   Candida parapsilosis NOT DETECTED NOT DETECTED Final   Candida tropicalis NOT DETECTED NOT DETECTED Final   Cryptococcus neoformans/gattii NOT DETECTED NOT DETECTED Final   Meth resistant mecA/C and MREJ DETECTED (A) NOT DETECTED Final    Comment: CRITICAL RESULT CALLED TO, READ BACK BY AND VERIFIED WITHGloris Manchester PHARMD 1425 03/14/20 A BROWNING Performed at [facility], [address], [place], [place] [address]   Resp Panel by RT-PCR (Flu A&B, Covid) Nasopharyngeal Swab     Status: None   Collection Time: 03/13/20  2:24 PM   Specimen: Nasopharyngeal Swab; Nasopharyngeal(NP) swabs in vial transport medium  Result Value Ref Range Status   SARS Coronavirus 2 by RT PCR NEGATIVE NEGATIVE Final    Comment: (NOTE) SARS-CoV-2 target nucleic acids are NOT DETECTED.  The SARS-CoV-2 RNA is generally detectable in upper respiratory specimens during the acute phase of infection. The lowest concentration of SARS-CoV-2 viral copies this assay can detect is 138 copies/mL. A negative result does not preclude SARS-Cov-2 infection and should not be used as the sole basis for treatment or other patient management decisions. A negative result may occur with  improper specimen collection/handling, submission of specimen other than nasopharyngeal swab, presence of viral mutation(s) within the areas targeted by this assay, and inadequate number of viral copies(<138 copies/mL). A negative result must be combined with clinical observations, patient history, and epidemiological information. The expected result is Negative.  Fact Sheet for Patients:  EntrepreneurPulse.com.au  Fact Sheet for Healthcare Providers:  IncredibleEmployment.be  This test is no t yet approved or cleared by the [place] FDA and  has been authorized for detection and/or diagnosis of SARS-CoV-2 by FDA under an Emergency  Use Authorization (EUA). This EUA will remain  in effect (meaning this test can be used) for the duration of the COVID-19 declaration under Section 564(b)(1) of the Act, 21 U.S.C.section 360bbb-3(b)(1), unless the authorization is terminated  or revoked sooner.       Influenza A by PCR NEGATIVE NEGATIVE Final   Influenza B by PCR NEGATIVE NEGATIVE Final    Comment: (NOTE) The Xpert Xpress SARS-CoV-2/FLU/RSV plus assay is intended as an aid in the diagnosis of influenza from Nasopharyngeal swab specimens and should not be used as a sole basis for treatment. Nasal washings and aspirates are unacceptable for Xpert Xpress SARS-CoV-2/FLU/RSV testing.  Fact Sheet for Patients: EntrepreneurPulse.com.au  Fact Sheet for Healthcare Providers: IncredibleEmployment.be  This test is not yet approved or cleared by the [place] FDA and has been authorized for detection and/or diagnosis of SARS-CoV-2 by FDA under an Emergency Use Authorization (EUA). This EUA will remain in effect (meaning this test can be used) for the duration of the COVID-19 declaration under Section 564(b)(1) of the Act, 21 U.S.C. section 360bbb-3(b)(1), unless the authorization is terminated or revoked.  Performed at [facility], [address] [place], [place] [address]   MRSA PCR Screening     Status: None   Collection Time: 03/14/20  2:01 PM   Specimen: Nasal Mucosa; Nasopharyngeal  Result Value Ref Range Status   MRSA by PCR NEGATIVE NEGATIVE Final    Comment:        The GeneXpert MRSA Assay (FDA approved for NASAL specimens only), is one component of a comprehensive MRSA colonization surveillance program. It is not intended to diagnose MRSA infection nor to guide or monitor treatment for MRSA infections. Performed at [facility], [address], [place], [place] [address]   Culture, blood (routine x 2)     Status: None   Collection Time: 03/14/20  2:05 PM    Specimen: BLOOD  Result Value Ref Range Status   Specimen Description BLOOD LEFT ANTECUBITAL  Final   Special Requests   Final    Blood Culture adequate volume BOTTLES DRAWN AEROBIC AND ANAEROBIC   Culture   Final    NO GROWTH 5 DAYS Performed at [facility], [address], [place], [place] [address]    Report Status 03/19/2020 FINAL  Final  Culture, blood (routine x 2)     Status: None   Collection Time: 03/14/20  2:09 PM   Specimen: BLOOD  Result Value Ref Range Status   Specimen Description BLOOD BLOOD LEFT HAND  Final   Special Requests   Final    Blood Culture adequate volume BOTTLES DRAWN AEROBIC AND ANAEROBIC   Culture   Final    NO GROWTH 5 DAYS Performed at [facility], [address] [place],  [address]    Report Status 03/19/2020 FINAL  Final    Radiology Studies: No results found.  Scheduled Meds: . atorvastatin  20 mg Oral Daily  . baclofen  10 mg Oral TID  . celecoxib  200 mg Oral BID  . Chlorhexidine Gluconate Cloth  6 each Topical Daily  . ciprofloxacin  500 mg Oral BID  . collagenase   Topical Daily  . gabapentin  600 mg Oral TID  .  metroNIDAZOLE  500 mg Oral Q8H  . propranolol  20 mg Oral BID   Continuous Infusions: . vancomycin 1,500 mg (03/18/20 1727)     LOS: 6 days   Time spent: 35 minutes  Dezman Granda Wynetta Emery, MD How to contact the [facility] Attending or Consulting provider [place] or covering provider during after hours [place], for this patient?  1. Check the care team in [facility] and look for a) attending/consulting [facility] provider listed and b) the [facility] team listed 2. Log into [email or web address] and use [place]'s universal password to access. If you do not have the password, please contact the hospital operator. 3. Locate the [facility] provider you are looking for under [facility] and page to a number that you can be directly reached. 4. If you still have difficulty reaching the provider, please page the [facility] (Director on Call) for the Hospitalists  listed on amion for assistance.  03/19/2020, 3:41 PM

## 2020-03-20 ENCOUNTER — Encounter (HOSPITAL_COMMUNITY): Payer: Self-pay | Admitting: Cardiology

## 2020-03-20 ENCOUNTER — Other Ambulatory Visit (INDEPENDENT_AMBULATORY_CARE_PROVIDER_SITE_OTHER): Payer: Self-pay | Admitting: Internal Medicine

## 2020-03-20 ENCOUNTER — Telehealth (INDEPENDENT_AMBULATORY_CARE_PROVIDER_SITE_OTHER): Payer: Self-pay

## 2020-03-20 DIAGNOSIS — F17210 Nicotine dependence, cigarettes, uncomplicated: Secondary | ICD-10-CM

## 2020-03-20 DIAGNOSIS — J439 Emphysema, unspecified: Secondary | ICD-10-CM | POA: Diagnosis not present

## 2020-03-20 DIAGNOSIS — L89152 Pressure ulcer of sacral region, stage 2: Secondary | ICD-10-CM | POA: Diagnosis not present

## 2020-03-20 DIAGNOSIS — M4802 Spinal stenosis, cervical region: Secondary | ICD-10-CM

## 2020-03-20 DIAGNOSIS — E785 Hyperlipidemia, unspecified: Secondary | ICD-10-CM

## 2020-03-20 DIAGNOSIS — M48061 Spinal stenosis, lumbar region without neurogenic claudication: Secondary | ICD-10-CM

## 2020-03-20 DIAGNOSIS — M47816 Spondylosis without myelopathy or radiculopathy, lumbar region: Secondary | ICD-10-CM | POA: Diagnosis not present

## 2020-03-20 DIAGNOSIS — I10 Essential (primary) hypertension: Secondary | ICD-10-CM

## 2020-03-20 DIAGNOSIS — G47419 Narcolepsy without cataplexy: Secondary | ICD-10-CM

## 2020-03-20 DIAGNOSIS — L89311 Pressure ulcer of right buttock, stage 1: Secondary | ICD-10-CM | POA: Diagnosis not present

## 2020-03-20 DIAGNOSIS — M47814 Spondylosis without myelopathy or radiculopathy, thoracic region: Secondary | ICD-10-CM | POA: Diagnosis not present

## 2020-03-20 LAB — BASIC METABOLIC PANEL
Anion gap: 7 (ref 5–15)
BUN: 7 mg/dL — ABNORMAL LOW (ref 8–23)
CO2: 25 mmol/L (ref 22–32)
Calcium: 7.9 mg/dL — ABNORMAL LOW (ref 8.9–10.3)
Chloride: 103 mmol/L (ref 98–111)
Creatinine, Ser: 0.51 mg/dL (ref 0.44–1.00)
GFR, Estimated: >60 mL/min (ref >60)
Glucose, Bld: 90 mg/dL (ref 70–99)
Potassium: 4.1 mmol/L (ref 3.5–5.1)
Sodium: 135 mmol/L (ref 135–145)

## 2020-03-20 LAB — MAGNESIUM: Magnesium: 1.7 mg/dL (ref 1.7–2.4)

## 2020-03-20 MED ORDER — TRAMADOL HCL 50 MG PO TABS: 100.0000 mg | ORAL_TABLET | Freq: Four times a day (QID) | ORAL | 0 refills | Status: DC | PRN

## 2020-03-20 MED ORDER — CIPROFLOXACIN HCL 500 MG PO TABS: 500.0000 mg | ORAL_TABLET | Freq: Two times a day (BID) | ORAL | 0 refills | Status: DC

## 2020-03-20 MED ORDER — METRONIDAZOLE 500 MG PO TABS: 500.0000 mg | ORAL_TABLET | Freq: Three times a day (TID) | ORAL | 0 refills | Status: DC

## 2020-03-20 MED ORDER — MAGNESIUM SULFATE 2 GM/50ML IV SOLN
2.0000 g | Freq: Once | INTRAVENOUS | Status: AC
  Administered 2020-03-20: 2 g via INTRAVENOUS
  Filled 2020-03-20: qty 50

## 2020-03-20 MED ORDER — VANCOMYCIN IV (FOR PTA / DISCHARGE USE ONLY): 1500.0000 mg | INTRAVENOUS | 0 refills | Status: DC

## 2020-03-20 NOTE — Discharge Instructions (Signed)
Bacteremia, Adult °Bacteremia is the presence of bacteria in the blood. When bacteria enter the bloodstream, they can cause a life-threatening reaction called sepsis. Sepsis is a medical emergency. °What are the causes? °This condition is caused by bacteria that get into the blood. Bacteria can enter the blood from an infection, including: °· A skin infection or injury, such as a burn or a cut. °· A lung infection (pneumonia). °· An infection in the stomach or intestines. °· An infection in the bladder or urinary system (urinary tract infection). °· A bacterial infection in another part of the body that spreads to the blood. °Bacteria can also enter the blood during a dental or medical procedure, from bleeding gums, or through use of an unclean needle. °What increases the risk? °This condition is more likely to develop in children, older adults, and people who have: °· A long-term (chronic) disease or condition like diabetes or chronic kidney failure. °· An artificial joint or heart valve, or heart valve disease. °· A tube inserted to treat a medical condition, such as a urinary catheter or IV. °· A weak disease-fighting system (immune system). °· Injected illegal drugs. °· Been hospitalized for more than 10 days in a row. °What are the signs or symptoms? °Symptoms of this condition include: °· Fever and chills. °· Fast heartbeat and shortness of breath. °· Dizziness, weakness, and low blood pressure. °· Confusion or anxiety. °· Pain in the abdomen, nausea, vomiting, and diarrhea. °Bacteremia that has spread to other parts of the body may cause symptoms in those areas. In some cases, there are no symptoms. °How is this diagnosed? °This condition may be diagnosed with a physical exam and tests, such as: °· Blood tests to check for bacteria (cultures) or other signs of infection. °· Tests of any tubes that you have had inserted. These tests check for a source of infection. °· Urine tests to check for bacteria in the  urine. °· Imaging tests, such as an X-ray, a CT scan, an MRI, or a heart ultrasound. These check for a source of infection in other parts of your body, such as your lungs, heart valves, or joints.   °How is this treated? °This condition is usually treated in the hospital. If you are treated at home, you may need to return to the hospital for medicines, blood tests, and evaluation. Treatment may include: °· Antibiotic medicines. These may be given by mouth or directly into your blood through an IV. You may need antibiotics for several weeks. At first, you may be given an antibiotic to kill most types of blood bacteria. If tests show that a certain kind of bacteria is causing the problem, you may be given a different antibiotic. °· IV fluids. °· Removing any catheter or device that could be a source of infection. °· Blood pressure and breathing support, if needed. °· Surgery to control the source or the spread of infection, such as surgery to remove an implanted device, abscess, or infected tissue. °Follow these instructions at home: °Medicines °· Take over-the-counter and prescription medicines only as told by your health care provider. °· If you were prescribed an antibiotic medicine, take it as told by your health care provider. Do not stop taking the antibiotic even if you start to feel better. °General instructions °· Rest as needed. Ask your health care provider when you may return to normal activities. °· Drink enough fluid to keep your urine pale yellow. °· Do not use any products that contain nicotine   or tobacco, such as cigarettes, e-cigarettes, and chewing tobacco. If you need help quitting, ask your health care provider. °· Keep all follow-up visits as told by your health care provider. This is important.   °How is this prevented? °· Wash your hands regularly with soap and water. If soap and water are not available, use hand sanitizer. °· You should wash your hands: °? After using the toilet or changing a  diaper. °? Before preparing, cooking, serving, or eating food. °? While caring for a sick person or while visiting someone in a hospital. °? Before and after changing bandages (dressings) over wounds. °· Clean any scrapes or cuts with soap and water and cover them with a clean bandage. °· Get vaccinations as recommended by your health care provider. °· Practice good oral hygiene. Brush your teeth two times a day, and floss regularly. °· Take good care of your skin. This includes bathing and moisturizing on a regular basis.   °Contact a health care provider if: °· Your symptoms get worse, and medicines do not help. °· You have severe pain. °Get help right away if you have: °· Pain. °· A fever or chills. °· Trouble breathing. °· A fast heart rate. °· Skin that is blotchy, pale, or clammy. °· Confusion. °· Weakness. °· Lack of energy or unusual sleepiness. °· New symptoms that develop after treatment has started. °These symptoms may represent a serious problem that is an emergency. Do not wait to see if the symptoms will go away. Get medical help right away. Call your local emergency services (911 in the [place] Do not drive yourself to the hospital. °Summary °· Bacteremia is the presence of bacteria in the blood. When bacteria enter the bloodstream, they can cause a life-threatening reaction called sepsis. °· Bacteremia is usually treated with antibiotic medicines in the hospital. °· If you were prescribed an antibiotic medicine, take it as told by your health care provider. Do not stop taking the antibiotic even if you start to feel better. °· Get help right away if you have any new symptoms that develop after treatment has started. °This information is not intended to replace advice given to you by your health care provider. Make sure you discuss any questions you have with your health care provider. °Document Revised: 07/07/2018 Document Reviewed: 07/07/2018 °Elsevier Patient Education © 2021 [employer] ° °

## 2020-03-20 NOTE — Telephone Encounter (Signed)
Faith Pina, RN Case Manager with [facility] called and left a detailed voice message and stated that the patient is currently in the hospital and she will need rehab when she is discharged. Faith [name] stated that the patient is currently receiving in home personal care services and will need an order on a letter head or a prescription pad with the following information to continue these services and allow her to receive rehab.  Needs to have this on paper to fax:  1) PCS ([place]) provided per treatment plan 2) Code # 58850 2) Medical diagnosis 4) Patients name and DOB 5) Doctors signature  They will authorize the personal care services for 1 year. Please let me know once complete and I will fax to [phone] Ashley's contact number is [phone]  Thank you!

## 2020-03-20 NOTE — Telephone Encounter (Signed)
I have read the notes from the physician and case manager in the hospital.  She is not going to go home and will be going to a skilled nursing facility so all this information at the present time is not necessary.

## 2020-03-20 NOTE — Telephone Encounter (Signed)
Called patient and LMOVM to return call  Eustace Pen and left a detailed voice message with the message from Dr. Anastasio Champion.

## 2020-03-20 NOTE — TOC Progression Note (Signed)
Transition of Care [facility]) - Progression Note   Patient Details  Name: Faith [name] MRN: [id number] Date of Birth: 1955-11-20  Transition of Care [facility]) CM/SW [place], LCSW Phone Number: 03/20/2020, 3:55 PM  Clinical Narrative: CSW followed up with [name] at [facility] multiple times throughout the day to ensure patient can discharge today. Per [name], she had to start pre-authorization for SNF regardless of the [facility] waiver. Patient can discharge tomorrow to [facility] to a vaccinated bed. CSW confirmed patient is vaccinated and has her card with her. Avoidable days updated. TOC awaiting bed availability.  Expected Discharge Plan: [place] Barriers to Discharge: Continued Medical Work up  Expected Discharge Plan and Services Expected Discharge Plan: [place] In-house Referral: Clinical Social Work Post Acute Care Choice: [place] Living arrangements for the past 2 months: Single Family Home             DME Arranged: N/A  Readmission Risk Interventions Readmission Risk Prevention Plan 03/19/2020  Medication Screening Complete  Transportation Screening Complete  Some recent data might be hidden

## 2020-03-20 NOTE — Discharge Summary (Signed)
Physician Discharge Summary  Faith [name] TUU:[id number] DOB: 02-15-1956 DOA: 03/13/2020  PCP: Doree Albee, MD  Admit date: 03/13/2020 Discharge date: 03/21/2020 30 Day Unplanned Readmission Risk Score   Flowsheet Row ED to Hosp-Admission (Current) from 03/13/2020 in [facility]  30 Day Unplanned Readmission Risk Score (%) 11.84 Filed at 03/20/2020 1200     This score is the patient's risk of an unplanned readmission within 30 days of being discharged (0 -100%). The score is based on dignosis, age, lab data, medications, orders, and past utilization.   Low:  0-14.9   Medium: 15-21.9   High: 22-29.9   Extreme: 30 and above         Admitted From: home Disposition:  SNF  Recommendations for Outpatient Follow-up:  1. Follow up with PCP in 1-2 weeks 2. Please obtain BMP/CBC in one week 3. Follow-up with general surgery in 2 to 4 weeks 4. Follow-up with ID in 2 to 4 weeks 5. Please follow up with your PCP on the following pending results: Unresulted Labs (From admission, onward)         None        Home Health: None Equipment/Devices: None  Discharge Condition: Stable CODE STATUS: Full code Diet recommendation: Cardiac  Subjective: Seen and examined.  She has no complaints.  Brief/Interim Summary: Faith A Brownis a 65 y.o.femalewith medical history significant formultiple sclerosis, COPD, pressure ulcer, hypertension. Patient was brought to the ED via EMS with complaints of fall, and family not being able to take care of her.Patient was admitted with SIRS criteria and decubitus ulcer with concern for need for debridement. She has had some worsening weakness and falls. She was diagnosed with MRSA bacteremia. She was also diagnosed with incidental appendicitis based on the CT scan but she did not have any symptoms.  She was seen by general surgery and started on ciprofloxacin and Flagyl for empiric treatment of appendicitis.Patient has undergone  transesophageal echocardiogram on 1/17 with no findings of vegetations. ID recommends 4 weeks of IV antibiotics with the last day of 04/10/2020.Marland Kitchen PICC line placed 1/17.  Right buttock decubitus ulcer -Appears to be mostly stage II with no need for debridement per general surgery -Continuediet -IV morphine as needed for pain control -Continue wound care protocols -Air Mattress recommended  Multiple sclerosis -Multiple falls which may be related -PT evaluation recommending SNF. -Continue home pain medications  Discharge Diagnoses:  Principal Problem:   Decubital ulcer Active Problems:   Falls frequently   Multiple sclerosis ([place])   SIRS (systemic inflammatory response syndrome) ([place])   Sacral decubitus ulcer, stage II ([place])   Acute appendicitis   Bacteremia    Discharge Instructions  Discharge Instructions    [facility] pharmacist to adjust dose for Vancomycin, Aminoglycosides and other anti-infective therapies as requested by physician.   Complete by: As directed    [facility] to provide Cath Flo 77m   Complete by: As directed    Administer for PICC line occlusion and as ordered by physician for other access device issues.   Anaphylaxis Kit: Provided to treat any anaphylactic reaction to the medication being provided to the patient if First Dose or when requested by physician   Complete by: As directed    Epinephrine 141mml vial / amp: Administer 0.16m216m0.16ml36mubcutaneously once for moderate to severe anaphylaxis, nurse to call physician and pharmacy when reaction occurs and call 911 if needed for immediate care   Diphenhydramine 50mg22mIV vial:  Administer 25-66m IV/IM PRN for first dose reaction, rash, itching, mild reaction, nurse to call physician and pharmacy when reaction occurs   Sodium Chloride 0.9% NS 5085mIV: Administer if needed for hypovolemic blood pressure drop or as ordered by physician after call to physician with anaphylactic  reaction   Change dressing on IV access line weekly and PRN   Complete by: As directed    Flush IV access with Sodium Chloride 0.9% and Heparin 10 units/ml or 100 units/ml   Complete by: As directed    Home infusion instructions - [facility]   Complete by: As directed    Instructions: Flush IV access with Sodium Chloride 0.9% and Heparin 10units/ml or 100units/ml   Change dressing on IV access line: Weekly and PRN   Instructions Cath Flo 23m66mAdminister for PICC Line occlusion and as ordered by physician for other access device   [facility] pharmacist to adjust dose for: Vancomycin, Aminoglycosides and other anti-infective therapies as requested by physician   Method of administration may be changed at the discretion of home infusion pharmacist based upon assessment of the patient and/or caregiver's ability to self-administer the medication ordered   Complete by: As directed      Allergies as of 03/21/2020      Reactions   Darvon [propoxyphene] Other (See Comments)   Hallicuations   Oxycodone-acetaminophen    Does not have any affect on her   Penicillins Rash   Did it involve swelling of the face/tongue/throat, SOB, or low BP? Unknown Did it involve sudden or severe rash/hives, skin peeling, or any reaction on the inside of your mouth or nose? Unknown Did you need to seek medical attention at a hospital or doctor's office? Unknown When did it last happen?Unknown If all above answers are "NO", may proceed with cephalosporin use.      Medication List    TAKE these medications   atorvastatin 20 MG tablet Commonly known as: LIPITOR Take 1 tablet (20 mg total) by mouth daily.   baclofen 10 MG tablet Commonly known as: LIORESAL Take 1 tablet (10 mg total) by mouth 3 (three) times daily.   celecoxib 200 MG capsule Commonly known as: CELEBREX TAKE (1) CAPSULE BY MOUTH EVERY 12 HOURS. What changed: See the new instructions.   Cholecalciferol 50 MCG  (2000 UT) Tabs Take 2,000 Int'l Units by mouth daily.   ciprofloxacin 500 MG tablet Commonly known as: CIPRO Take 1 tablet (500 mg total) by mouth 2 (two) times daily for 3 days.   diazepam 5 MG tablet Commonly known as: VALIUM TAKE (1) TABLET BY MOUTH EVERY SIX HOURS AS NEEDED FOR MUSCLE SPASMS. At least 3x/day- need to take it. For spasms   gabapentin 300 MG capsule Commonly known as: Neurontin Take 2 capsules (600 mg total) by mouth 3 (three) times daily.   ibuprofen 800 MG tablet Commonly known as: ADVIL TAKE (1) TABLET BY MOUTH DAILY AS NEEDED. What changed: See the new instructions.   metroNIDAZOLE 500 MG tablet Commonly known as: FLAGYL Take 1 tablet (500 mg total) by mouth every 8 (eight) hours for 3 days.   propranolol 20 MG tablet Commonly known as: INDERAL Take 20 mg by mouth 2 (two) times daily.   propranolol 40 MG tablet Commonly known as: INDERAL Take 40 mg by mouth 2 (two) times daily.   traMADol 50 MG tablet Commonly known as: ULTRAM Take 2 tablets (100 mg total) by mouth every 6 (six) hours as needed for  moderate pain.   vancomycin  IVPB Inject 1,500 mg into the vein daily for 23 days. Indication:  MRSA BActeremia First Dose: No Last Day of Therapy:  04/10/2020 Labs - "Sunday/Monday:  CBC/D, BMP, and vancomycin trough. Labs - Thursday:  BMP and vancomycin trough Labs - Every other week:  ESR and CRP Method of administration:Elastomeric Method of administration may be changed at the discretion of the patient and/or caregiver's ability to self-administer the medication ordered.            Discharge Care Instructions  (From admission, onward)         Start     Ordered   03/20/20 0000  Change dressing on IV access line weekly and PRN  (Home infusion instructions - [facility] )        01" /20/22 1522          Contact information for follow-up providers    Virl Cagey, MD Follow up on 04/22/2020.   Specialty: General  Surgery Why: 1 month from discharge  Contact information: [address] [facility] [facility] [phone]        Doree Albee, MD Follow up in 1 week(s).   Specialty: Internal Medicine Contact information: [place] [address] [phone]            Contact information for after-discharge care    [facility] .   Service: Skilled Nursing Contact information: [place] [phone]                 Allergies  Allergen Reactions  . Darvon [Propoxyphene] Other (See Comments)    Hallicuations  . Oxycodone-Acetaminophen     Does not have any affect on her  . Penicillins Rash    Did it involve swelling of the face/tongue/throat, SOB, or low BP? Unknown Did it involve sudden or severe rash/hives, skin peeling, or any reaction on the inside of your mouth or nose? Unknown Did you need to seek medical attention at a hospital or doctor's office? Unknown When did it last happen?Unknown If all above answers are "NO", may proceed with cephalosporin use.     Consultations: General surgery and ID   Procedures/Studies: DG Shoulder Right  Result Date: 03/12/2020 CLINICAL DATA:  Pain following fall EXAM: RIGHT SHOULDER - 2+ VIEW COMPARISON:  None. FINDINGS: Frontal and Y scapular images were obtained. No fracture or dislocation. There is moderate generalized joint space narrowing. No erosive change or intra-articular calcification. Visualized right lung clear. IMPRESSION: Moderate generalized osteoarthritic change. No fracture or dislocation. Electronically Signed   By: Lowella Grip III M.D.   On: 03/12/2020 09:58   CT PELVIS W CONTRAST  Result Date: 03/13/2020 CLINICAL DATA:  65 year old female with decubitus sacral ulcer. EXAM: CT PELVIS WITH CONTRAST TECHNIQUE: Multidetector CT imaging of the pelvis was performed using the standard protocol  following the bolus administration of intravenous contrast. CONTRAST:  3m OMNIPAQUE IOHEXOL 300 MG/ML  SOLN COMPARISON:  None. FINDINGS: Urinary Tract:  The urinary bladder is unremarkable. Bowel: No bowel dilatation. Mildly thickened appendix measuring approximately 11 mm in thickness. There is mild haziness of the peripancreatic Cl fat. Clinical correlation is recommended. Vascular/Lymphatic: Advanced aortoiliac atherosclerotic disease. No adenopathy. Reproductive: The uterus is retroverted and grossly unremarkable. No adnexal masses. Mildly dilated pelvic vasculature. Other: There is inflammatory changes and induration of the superficial soft tissues posterior and inferior to the distal sacrum/coccyx and  to the right of the midline extending inferiorly along the intergluteal cleft. An ill-defined low attenuating area in the superficial soft tissues of the right buttock measures 5.4 x 4.5 cm (coronal 56/5). This most likely represents a phlegmonous tissue. No drainable fluid collection noted. Musculoskeletal: Osteopenia. No acute osseous pathology. No bone erosion or evidence of acute osteomyelitis. IMPRESSION: 1. Phlegmonous changes of the soft tissues posterior and inferior to the sacrum and to the right of the midline. No drainable fluid collection. No CT findings of acute osteomyelitis. 2. Mildly thickened appendix with mild haziness of the peripancreatic Cl fat. Clinical correlation is recommended to exclude early acute appendicitis. 3. Aortic Atherosclerosis (ICD10-I70.0). Electronically Signed   By: Anner Crete M.D.   On: 03/13/2020 17:23   MR Brain W and Wo Contrast  Result Date: 03/01/2020 CLINICAL DATA:  Multiple sclerosis EXAM: MRI HEAD WITHOUT AND WITH CONTRAST MRI CERVICAL SPINE WITHOUT AND WITH CONTRAST TECHNIQUE: Multiplanar, multiecho pulse sequences of the brain and surrounding structures, and cervical spine, to include the craniocervical junction and cervicothoracic junction, were  obtained without and with intravenous contrast. CONTRAST:  4.40m GADAVIST GADOBUTROL 1 MMOL/ML IV SOLN COMPARISON:  01/18/2019 FINDINGS: MRI HEAD FINDINGS Brain: No acute infarct, mass effect or extra-axial collection. No acute or chronic hemorrhage. Numerous bilateral periventricular white matter lesions and pattern unchanged compared to 01/18/2019. The midline structures are normal. There is no abnormal contrast enhancement. Vascular: Major flow voids are preserved. Skull and upper cervical spine: Unchanged left frontal osteoma Sinuses/Orbits:No paranasal sinus fluid levels or advanced mucosal thickening. No mastoid or middle ear effusion. Normal orbits. MRI CERVICAL SPINE FINDINGS Alignment: Reversal of normal cervical lordosis. Vertebrae: C3-4 anterior fusion. Cord: There is a lesion within the right dorsal spinal cord at the C4 level, more clearly visible on the current study. This could be due to demyelination or prior cord compression. No other spinal cord lesions. Posterior Fossa, vertebral arteries, paraspinal tissues: Loss of the normal left vertebral artery flow void. Disc levels: C2-3: Left uncovertebral osteophyte causes moderate left foraminal stenosis unchanged. C3-4: Posterior decompression with anterior fusion. C4-5: Posterior decompression.  No spinal canal stenosis. C5-6: Bilateral uncovertebral hypertrophy with moderate bilateral foraminal stenosis, progressed on the right. C6-7: Left asymmetric disc bulge with bilateral uncovertebral hypertrophy. Unchanged severe left foraminal stenosis. C7-T1: No spinal canal stenosis. IMPRESSION: 1. Unchanged distribution of cerebral white matter lesions in a pattern consistent with multiple sclerosis. No new or active demyelinating lesions. 2. Focus of hyperintense T2-weighted signal within the right dorsal spinal cord at the C4 level is more clearly visible on the current study. This could be due to demyelination or prior cord compression. 3. No active  demyelinating lesions of the cervical spine. 4. C3-4 anterior fusion with widely patent spinal canal. 5. Severe left C7 foraminal stenosis and moderate stenosis at left C3 and bilateral C6. Electronically Signed   By: KUlyses JarredM.D.   On: 03/01/2020 02:23   MR THORACIC SPINE WO CONTRAST  Result Date: 03/01/2020 CLINICAL DATA:  65year old female with a history of COPD, multiple sclerosis and cervical myelopathy. Lower extremity weakness beginning a week ago with progression. Status post brain and cervical spine MRI without and with contrast at 0130 hours today. EXAM: MRI THORACIC SPINE WITHOUT CONTRAST TECHNIQUE: Multiplanar, multisequence MR imaging of the thoracic spine was performed. No intravenous contrast was administered. COMPARISON:  Cervical spine MRI today reported separately. FINDINGS: Limited cervical spine imaging: Stable from the cervical MRI earlier today. Thoracic spine segmentation:  Appears  normal. Alignment: Normal to mildly exaggerated thoracic kyphosis. No spondylolisthesis. Vertebrae: T4 benign vertebral body hemangioma redemonstrated. No marrow edema or evidence of acute osseous abnormality. Cord: No evidence of abnormal intradural enhancement on these 9-10 hour delayed postcontrast images. But there is widespread abnormal increased T2 and STIR hyperintensity within the thoracic cord, including multiple right hemi cord lesions from the cervicothoracic junction through T4 (series 42, image 12), patchy more holo cord lesions at T7, T10 (series 41, image 8), and patchy ventral lesion about 1 level above the conus at T11 (same image). There is also a left dorsal hemicord lesion at T10-T11 (series 43, image 31). Levels of associated spinal cord volume loss (series 43, image 13). No cord expansion. Heterogeneous increased signal also suspected in the conus at T12. Paraspinal and other soft tissues: Negative. Disc levels: Capacious thoracic spinal canal and mild for age spine degeneration. No  significant disc herniation. No thoracic spinal stenosis. Degenerative thoracic neural foraminal stenosis limited to the T1 and T2 nerve levels. IMPRESSION: 1. Extensive chronic appearing demyelinating disease in the thoracic spinal cord with cord volume loss. No cord expansion or delayed postcontrast enhancement (s/p gadolinium contrast at 0130 hours today) to suggest active demyelination. 2. Mild for age superimposed thoracic spine degeneration. No thoracic spinal stenosis. Electronically Signed   By: Genevie Ann M.D.   On: 03/01/2020 11:33   MR LUMBAR SPINE WO CONTRAST  Result Date: 03/01/2020 CLINICAL DATA:  65 year old female with a history of COPD, multiple sclerosis and cervical myelopathy. Lower extremity weakness beginning a week ago with progression. Received IV gadolinium contrast about 0130 hours today 4 earlier brain and cervical spine study. EXAM: MRI LUMBAR SPINE WITHOUT CONTRAST TECHNIQUE: Multiplanar, multisequence MR imaging of the lumbar spine was performed. No intravenous contrast was administered. COMPARISON:  Cervical and thoracic spine MRI today reported separately. FINDINGS: Segmentation:  Normal, concordant with the thoracic numbering today. Alignment: Mild straightening of lumbar lordosis and dextroconvex lower lumbar scoliosis. No spondylolisthesis. Vertebrae: Patchy degenerative appearing anterior endplate marrow edema at L2-L3. Normal background bone marrow signal. Mix of mild acute and chronic marrow signal changes at the L4-L5 endplates. Background bone marrow signal within normal limits. Intact visible sacrum and SI joints. Conus medullaris and cauda equina: Conus extends to the T12 level. Conus reported separately. Cauda equina nerve roots appear symmetric and normal. No delayed abnormal intradural enhancement. Paraspinal and other soft tissues: Negative. There is no paraspinal soft tissue inflammation. Disc levels: T12-L1:  Negative. L1-L2:  Negative aside from facet hypertrophy.  L2-L3: Disc space loss. Circumferential disc bulge and endplate spurring eccentric to the right. Mild to moderate facet hypertrophy greater on the right. Trace facet joint fluid. No spinal stenosis. Moderate right lateral recess stenosis (right L3 nerve level). Mild right L2 foraminal stenosis. L3-L4: Disc desiccation, disc space loss and circumferential disc bulge. Mild to moderate facet hypertrophy. Mild spinal and right lateral recess stenosis (right L4 nerve level). Mild right L3 foraminal stenosis. L4-L5: Disc space loss. Heterogeneous signal within the disc but no convincing endplate or paraspinal inflammation. Endplate spurring. Mild facet hypertrophy. No significant spinal stenosis. Mild to moderate bilateral lateral recess stenosis (L5 nerve levels). Moderate to severe left and mild right L4 foraminal stenosis. L5-S1: Minor disc bulging. Moderate facet hypertrophy. Mild left L5 foraminal stenosis. IMPRESSION: 1. Cauda equina nerve roots appear normal. No thickening or delayed postcontrast enhancement. 2. Lumbar spine degeneration degeneration at L2-L3 through L4-L5. Mild degenerative appearing endplate marrow edema at the former. - mild L3-L4  spinal stenosis. - moderate right lateral recess stenosis at the right L3 and bilateral L5 nerve levels. - moderate to severe left L4 neural foraminal stenosis. Electronically Signed   By: Genevie Ann M.D.   On: 03/01/2020 11:57   MR Cervical Spine W or Wo Contrast  Result Date: 03/01/2020 CLINICAL DATA:  Multiple sclerosis EXAM: MRI HEAD WITHOUT AND WITH CONTRAST MRI CERVICAL SPINE WITHOUT AND WITH CONTRAST TECHNIQUE: Multiplanar, multiecho pulse sequences of the brain and surrounding structures, and cervical spine, to include the craniocervical junction and cervicothoracic junction, were obtained without and with intravenous contrast. CONTRAST:  4.53m GADAVIST GADOBUTROL 1 MMOL/ML IV SOLN COMPARISON:  01/18/2019 FINDINGS: MRI HEAD FINDINGS Brain: No acute infarct,  mass effect or extra-axial collection. No acute or chronic hemorrhage. Numerous bilateral periventricular white matter lesions and pattern unchanged compared to 01/18/2019. The midline structures are normal. There is no abnormal contrast enhancement. Vascular: Major flow voids are preserved. Skull and upper cervical spine: Unchanged left frontal osteoma Sinuses/Orbits:No paranasal sinus fluid levels or advanced mucosal thickening. No mastoid or middle ear effusion. Normal orbits. MRI CERVICAL SPINE FINDINGS Alignment: Reversal of normal cervical lordosis. Vertebrae: C3-4 anterior fusion. Cord: There is a lesion within the right dorsal spinal cord at the C4 level, more clearly visible on the current study. This could be due to demyelination or prior cord compression. No other spinal cord lesions. Posterior Fossa, vertebral arteries, paraspinal tissues: Loss of the normal left vertebral artery flow void. Disc levels: C2-3: Left uncovertebral osteophyte causes moderate left foraminal stenosis unchanged. C3-4: Posterior decompression with anterior fusion. C4-5: Posterior decompression.  No spinal canal stenosis. C5-6: Bilateral uncovertebral hypertrophy with moderate bilateral foraminal stenosis, progressed on the right. C6-7: Left asymmetric disc bulge with bilateral uncovertebral hypertrophy. Unchanged severe left foraminal stenosis. C7-T1: No spinal canal stenosis. IMPRESSION: 1. Unchanged distribution of cerebral white matter lesions in a pattern consistent with multiple sclerosis. No new or active demyelinating lesions. 2. Focus of hyperintense T2-weighted signal within the right dorsal spinal cord at the C4 level is more clearly visible on the current study. This could be due to demyelination or prior cord compression. 3. No active demyelinating lesions of the cervical spine. 4. C3-4 anterior fusion with widely patent spinal canal. 5. Severe left C7 foraminal stenosis and moderate stenosis at left C3 and bilateral  C6. Electronically Signed   By: KUlyses JarredM.D.   On: 03/01/2020 02:23   DG Chest Port 1 View  Result Date: 03/13/2020 CLINICAL DATA:  Sepsis. EXAM: PORTABLE CHEST 1 VIEW COMPARISON:  January 28, 2009. FINDINGS: The heart size and mediastinal contours are within normal limits. Both lungs are clear. The visualized skeletal structures are unremarkable. IMPRESSION: No active disease. Electronically Signed   By: JMarijo ConceptionM.D.   On: 03/13/2020 11:29   ECHOCARDIOGRAM COMPLETE  Result Date: 03/15/2020    ECHOCARDIOGRAM REPORT   Patient Name:   Faith SLONIKERDate of Exam: 03/15/2020 Medical Rec #:  [id number]   Height:       64.0 in Accession #:    [id number]  Weight:       97.0 lb Date of Birth:  [birth year]   BSA:          1.438 m Patient Age:    89years     BP:           121/81 mmHg Patient Gender: F            HR:  85 bpm. Exam Location:  [facility] Procedure: 2D Echo, Cardiac Doppler and Color Doppler Indications:    Bacteremia  History:        Patient has no prior history of Echocardiogram examinations.  Sonographer:    Merrie Roof Referring Phys: [id number] [place]  1. Left ventricular ejection fraction, by estimation, is 60 to 65%. The left ventricle has normal function. The left ventricle has no regional wall motion abnormalities. Left ventricular diastolic parameters were normal.  2. Right ventricular systolic function is normal. The right ventricular size is normal. There is mildly elevated pulmonary artery systolic pressure.  3. The mitral valve is normal in structure. Trivial mitral valve regurgitation. No evidence of mitral stenosis.  4. The aortic valve is tricuspid. Aortic valve regurgitation is not visualized. Mild aortic valve sclerosis is present, with no evidence of aortic valve stenosis.  5. The inferior vena cava is normal in size with greater than 50% respiratory variability, suggesting right atrial pressure of 3 mmHg. FINDINGS  Left Ventricle: Left  ventricular ejection fraction, by estimation, is 60 to 65%. The left ventricle has normal function. The left ventricle has no regional wall motion abnormalities. The left ventricular internal cavity size was normal in size. There is  no left ventricular hypertrophy. Left ventricular diastolic parameters were normal. Right Ventricle: The right ventricular size is normal. Right ventricular systolic function is normal. There is mildly elevated pulmonary artery systolic pressure. The tricuspid regurgitant velocity is 3.06 m/s, and with an assumed right atrial pressure of 3 mmHg, the estimated right ventricular systolic pressure is 04.5 mmHg. Left Atrium: Left atrial size was normal in size. Right Atrium: Right atrial size was normal in size. Pericardium: There is no evidence of pericardial effusion. Mitral Valve: The mitral valve is normal in structure. Mild mitral annular calcification. Trivial mitral valve regurgitation. No evidence of mitral valve stenosis. Tricuspid Valve: The tricuspid valve is normal in structure. Tricuspid valve regurgitation is mild . No evidence of tricuspid stenosis. Aortic Valve: The aortic valve is tricuspid. Aortic valve regurgitation is not visualized. Mild aortic valve sclerosis is present, with no evidence of aortic valve stenosis. Pulmonic Valve: The pulmonic valve was grossly normal. Pulmonic valve regurgitation is not visualized. No evidence of pulmonic stenosis. Aorta: The aortic root is normal in size and structure. Venous: The inferior vena cava is normal in size with greater than 50% respiratory variability, suggesting right atrial pressure of 3 mmHg. IAS/Shunts: No atrial level shunt detected by color flow Doppler.  LEFT VENTRICLE PLAX 2D LVIDd:         4.21 cm     Diastology LVIDs:         3.27 cm     LV e' medial:    8.70 cm/s LV PW:         1.01 cm     LV E/e' medial:  8.6 LV IVS:        1.02 cm     LV e' lateral:   12.90 cm/s                            LV E/e' lateral: 5.8   LV Volumes (MOD) LV vol d, MOD A4C: 62.5 ml LV vol s, MOD A4C: 27.4 ml LV SV MOD A4C:     62.5 ml RIGHT VENTRICLE RV Basal diam:  4.04 cm RV Mid diam:    3.26 cm LEFT ATRIUM  Index       RIGHT ATRIUM           Index LA diam:      3.10 cm 2.16 cm/m  RA Area:     16.30 cm LA Vol (A2C): 45.5 ml 31.63 ml/m RA Volume:   40.60 ml  28.22 ml/m  AORTIC VALVE LVOT Vmax:   106.00 cm/s LVOT Vmean:  65.600 cm/s LVOT VTI:    0.202 m  AORTA Ao Root diam: 3.30 cm Ao Asc diam:  2.55 cm MITRAL VALVE               TRICUSPID VALVE MV Area (PHT): 4.54 cm    TR Peak grad:   37.5 mmHg MV Decel Time: 167 msec    TR Vmax:        306.00 cm/s MV E velocity: 75.00 cm/s MV A velocity: 62.60 cm/s  SHUNTS MV E/A ratio:  1.20        Systemic VTI: 0.20 m Kirk Ruths MD Electronically signed by Kirk Ruths MD Signature Date/Time: 03/15/2020/2:59:38 PM    Final    ECHO TEE  Result Date: 03/17/2020    TRANSESOPHOGEAL ECHO REPORT   Patient Name:   Faith [name] Date of Exam: 03/17/2020 Medical Rec #:  [id number]    Height:       64.0 in Accession #:    [id number]   Weight:       97.0 lb Date of Birth:  04-12-1955    BSA:          1.438 m Patient Age:    83 years     BP:           113/89 mmHg Patient Gender: F            HR:           75 bpm. Exam Location:  [facility] Procedure: Transesophageal Echo, Cardiac Doppler and Color Doppler Indications:    Bacteremia 790.7 / R78.81  History:        Patient has prior history of Echocardiogram examinations, most                 recent 03/16/2019. COPD; Risk Factors:Hypertension, Dyslipidemia                 and Current Smoker. Multiple sclerosis.  Sonographer:    Alvino Chapel RCS Referring Phys: Mercer: TEE procedure time was 8 minutes. The transesophogeal probe was passed without difficulty through the esophogus of the patient. Imaged were obtained with the patient in a left lateral decubitus position. Sedation performed by different physician. The patient was  monitored while under deep sedation. Image quality was good. The patient developed no complications during the procedure. IMPRESSIONS  1. Left ventricular ejection fraction, by estimation, is 65 to 70%. The left ventricle has normal function. The left ventricle has no regional wall motion abnormalities.  2. Right ventricular systolic function is normal. The right ventricular size is normal.  3. No left atrial/left atrial appendage thrombus was detected. The LAA emptying velocity was 60 cm/s.  4. The mitral valve is grossly normal. Mild mitral valve regurgitation.  5. Tricuspid valve regurgitation is mild to moderate.  6. The aortic valve is tricuspid. Aortic valve regurgitation is not visualized.  7. There is Moderate (Grade III) atheroma plaque involving the descending aorta.  8. Evidence of small PFO with trivial degree of left to right shunting.  9. No obvious valvular vegetations noted.  FINDINGS  Left Ventricle: Left ventricular ejection fraction, by estimation, is 65 to 70%. The left ventricle has normal function. The left ventricle has no regional wall motion abnormalities. The left ventricular internal cavity size was normal in size. Right Ventricle: The right ventricular size is normal. No increase in right ventricular wall thickness. Right ventricular systolic function is normal. Left Atrium: Left atrial size was normal in size. No left atrial/left atrial appendage thrombus was detected. The LAA emptying velocity was 60 cm/s. Right Atrium: Right atrial size was normal in size. Pericardium: There is no evidence of pericardial effusion. Mitral Valve: The mitral valve is grossly normal. Mild mitral valve regurgitation. Tricuspid Valve: The tricuspid valve is grossly normal. Tricuspid valve regurgitation is mild to moderate. Aortic Valve: The aortic valve is tricuspid. Aortic valve regurgitation is not visualized. Pulmonic Valve: The pulmonic valve was grossly normal. Pulmonic valve regurgitation is mild.  Aorta: The aortic root is normal in size and structure. There is moderate (Grade III) atheroma plaque involving the descending aorta. IAS/Shunts: Evidence of small PFO with trivial degree of left to right shunting. Rozann Lesches MD Electronically signed by Rozann Lesches MD Signature Date/Time: 03/17/2020/12:59:45 PM    Final    VAS [place] LOWER EXTREMITY VENOUS (DVT)  Result Date: 03/06/2020  Lower Venous DVT Study Other Indications: BLE weakness with polycythemia. Comparison Study: Prior study available (12/2018) Performing Technologist: Rogelia Rohrer  Examination Guidelines: A complete evaluation includes B-mode imaging, spectral Doppler, color Doppler, and power Doppler as needed of all accessible portions of each vessel. Bilateral testing is considered an integral part of a complete examination. Limited examinations for reoccurring indications may be performed as noted. The reflux portion of the exam is performed with the patient in reverse Trendelenburg.  +---------+---------------+---------+-----------+----------+--------------+ RIGHT    CompressibilityPhasicitySpontaneityPropertiesThrombus Aging +---------+---------------+---------+-----------+----------+--------------+ CFV      Full           Yes      Yes                                 +---------+---------------+---------+-----------+----------+--------------+ SFJ      Full                                                        +---------+---------------+---------+-----------+----------+--------------+ FV Prox  Full           Yes      Yes                                 +---------+---------------+---------+-----------+----------+--------------+ FV Mid   Full           Yes      Yes                                 +---------+---------------+---------+-----------+----------+--------------+ FV DistalFull           Yes      Yes                                  +---------+---------------+---------+-----------+----------+--------------+ POP      Full  Yes      Yes                                 +---------+---------------+---------+-----------+----------+--------------+ PTV      Full                                                        +---------+---------------+---------+-----------+----------+--------------+ PERO     Full                                                        +---------+---------------+---------+-----------+----------+--------------+   +---------+---------------+---------+-----------+----------+--------------+ LEFT     CompressibilityPhasicitySpontaneityPropertiesThrombus Aging +---------+---------------+---------+-----------+----------+--------------+ CFV      Full           Yes      Yes                                 +---------+---------------+---------+-----------+----------+--------------+ SFJ      Full                                                        +---------+---------------+---------+-----------+----------+--------------+ FV Prox  Full           Yes      Yes                                 +---------+---------------+---------+-----------+----------+--------------+ FV Mid   Full           Yes      Yes                                 +---------+---------------+---------+-----------+----------+--------------+ FV DistalFull           Yes      Yes                                 +---------+---------------+---------+-----------+----------+--------------+ PFV      Full                                                        +---------+---------------+---------+-----------+----------+--------------+ POP      Full           Yes      Yes                                 +---------+---------------+---------+-----------+----------+--------------+ PTV      Full                                                         +---------+---------------+---------+-----------+----------+--------------+  PERO     Full                                                        +---------+---------------+---------+-----------+----------+--------------+     Summary: RIGHT: - There is no evidence of deep vein thrombosis in the lower extremity.  - No cystic structure found in the popliteal fossa.  LEFT: - There is no evidence of deep vein thrombosis in the lower extremity.  - No cystic structure found in the popliteal fossa.  *See table(s) above for measurements and observations. Electronically signed by Harold Barban MD on 03/06/2020 at 3:42:06 PM.    Final    [place] EKG SITE RITE  Result Date: 03/17/2020 If Site Rite image not attached, placement could not be confirmed due to current cardiac rhythm.    Discharge Exam: Vitals:   03/21/20 0518 03/21/20 0925  BP: 119/69 121/84  Pulse: 73 79  Resp: 18   Temp: 98.2 F (36.8 C)   SpO2: 97%    Vitals:   03/20/20 1420 03/20/20 2105 03/21/20 0518 03/21/20 0925  BP: 121/78 123/73 119/69 121/84  Pulse: 75 75 73 79  Resp: '20 18 18   ' Temp: 97.7 F (36.5 C) 97.9 F (36.6 C) 98.2 F (36.8 C)   TempSrc: Oral Oral Oral   SpO2: 96% 97% 97%   Weight:      Height:        General: Pt is alert, awake, not in acute distress Cardiovascular: RRR, S1/S2 +, no rubs, no gallops Respiratory: CTA bilaterally, no wheezing, no rhonchi Abdominal: Soft, NT, ND, bowel sounds + Extremities: no edema, no cyanosis    The results of significant diagnostics from this hospitalization (including imaging, microbiology, ancillary and laboratory) are listed below for reference.     Microbiology: Recent Results (from the past 240 hour(s))  Urine culture     Status: Abnormal   Collection Time: 03/13/20 10:25 AM   Specimen: In/Out Cath Urine  Result Value Ref Range Status   Specimen Description   Final    IN/OUT CATH URINE Performed at [facility], [address], [place], [place] [address]     Special Requests   Final    NONE Performed at [facility], [address], [place], [place] [address]    Culture MULTIPLE SPECIES PRESENT, SUGGEST RECOLLECTION (A)  Final   Report Status 03/15/2020 FINAL  Final  SARS CORONAVIRUS 2 (TAT 6-24 HRS) Nasopharyngeal Nasopharyngeal Swab     Status: None   Collection Time: 03/13/20 11:07 AM   Specimen: Nasopharyngeal Swab  Result Value Ref Range Status   SARS Coronavirus 2 NEGATIVE NEGATIVE Final    Comment: (NOTE) SARS-CoV-2 target nucleic acids are NOT DETECTED.  The SARS-CoV-2 RNA is generally detectable in upper and lower respiratory specimens during the acute phase of infection. Negative results do not preclude SARS-CoV-2 infection, do not rule out co-infections with other pathogens, and should not be used as the sole basis for treatment or other patient management decisions. Negative results must be combined with clinical observations, patient history, and epidemiological information. The expected result is Negative.  Fact Sheet for Patients: SugarRoll.be  Fact Sheet for Healthcare Providers: https://www.woods-mathews.com/  This test is not yet approved or cleared by the [place] FDA and  has been authorized for detection and/or diagnosis of SARS-CoV-2  by FDA under an Emergency Use Authorization (EUA). This EUA will remain  in effect (meaning this test can be used) for the duration of the COVID-19 declaration under Se ction 564(b)(1) of the Act, 21 U.S.C. section 360bbb-3(b)(1), unless the authorization is terminated or revoked sooner.  Performed at [facility], [place] [address] [place], [place] [address]   Blood culture (routine single)     Status: Abnormal   Collection Time: 03/13/20 11:22 AM   Specimen: BLOOD  Result Value Ref Range Status   Specimen Description   Final    BLOOD RIGHT ANTECUBITAL Performed at [facility], [address], [place], [place] [address]     Special Requests   Final    Blood Culture results may not be optimal due to an excessive volume of blood received in culture bottles BOTTLES DRAWN AEROBIC AND ANAEROBIC Performed at [facility], [address], [place], [place] [address]    Culture  Setup Time   Final    GRAM POSITIVE COCCI IN CLUSTERS AEROBIC BOTTLE ONLY Organism ID to follow CRITICAL RESULT CALLED TO, READ BACK BY AND VERIFIED WITHGloris Manchester IOXBDZ [id number] 03/14/20 A BROWNING Performed at [facility], [place] [address] [place], [place] [address]    Culture METHICILLIN RESISTANT STAPHYLOCOCCUS AUREUS (A)  Final   Report Status 03/16/2020 FINAL  Final   Organism ID, Bacteria METHICILLIN RESISTANT STAPHYLOCOCCUS AUREUS  Final      Susceptibility   Methicillin resistant staphylococcus aureus - MIC*    CIPROFLOXACIN >=8 RESISTANT Resistant     ERYTHROMYCIN <=0.25 SENSITIVE Sensitive     GENTAMICIN <=0.5 SENSITIVE Sensitive     OXACILLIN >=4 RESISTANT Resistant     TETRACYCLINE <=1 SENSITIVE Sensitive     VANCOMYCIN 1 SENSITIVE Sensitive     TRIMETH/SULFA <=10 SENSITIVE Sensitive     CLINDAMYCIN <=0.25 SENSITIVE Sensitive     RIFAMPIN <=0.5 SENSITIVE Sensitive     Inducible Clindamycin NEGATIVE Sensitive     * METHICILLIN RESISTANT STAPHYLOCOCCUS AUREUS  Blood Culture ID Panel (Reflexed)     Status: Abnormal   Collection Time: 03/13/20 11:22 AM  Result Value Ref Range Status   Enterococcus faecalis NOT DETECTED NOT DETECTED Final   Enterococcus Faecium NOT DETECTED NOT DETECTED Final   Listeria monocytogenes NOT DETECTED NOT DETECTED Final   Staphylococcus species DETECTED (A) NOT DETECTED Final    Comment: CRITICAL RESULT CALLED TO, READ BACK BY AND VERIFIED WITHGloris Manchester PHARMD 1425 03/14/20 A BROWNING    Staphylococcus aureus (BCID) DETECTED (A) NOT DETECTED Final    Comment: Methicillin (oxacillin)-resistant Staphylococcus aureus (MRSA). MRSA is predictably resistant to beta-lactam antibiotics (except  ceftaroline). Preferred therapy is vancomycin unless clinically contraindicated. Patient requires contact precautions if  hospitalized. CRITICAL RESULT CALLED TO, READ BACK BY AND VERIFIED WITH: Gloris Manchester PHARMD 1425 03/14/20 A BROWNING    Staphylococcus epidermidis NOT DETECTED NOT DETECTED Final   Staphylococcus lugdunensis NOT DETECTED NOT DETECTED Final   Streptococcus species NOT DETECTED NOT DETECTED Final   Streptococcus agalactiae NOT DETECTED NOT DETECTED Final   Streptococcus pneumoniae NOT DETECTED NOT DETECTED Final   Streptococcus pyogenes NOT DETECTED NOT DETECTED Final   A.calcoaceticus-baumannii NOT DETECTED NOT DETECTED Final   Bacteroides fragilis NOT DETECTED NOT DETECTED Final   Enterobacterales NOT DETECTED NOT DETECTED Final   Enterobacter cloacae complex NOT DETECTED NOT DETECTED Final   Escherichia coli NOT DETECTED NOT DETECTED Final   Klebsiella aerogenes NOT DETECTED NOT DETECTED Final  Klebsiella oxytoca NOT DETECTED NOT DETECTED Final   Klebsiella pneumoniae NOT DETECTED NOT DETECTED Final   Proteus species NOT DETECTED NOT DETECTED Final   Salmonella species NOT DETECTED NOT DETECTED Final   Serratia marcescens NOT DETECTED NOT DETECTED Final   Haemophilus influenzae NOT DETECTED NOT DETECTED Final   Neisseria meningitidis NOT DETECTED NOT DETECTED Final   Pseudomonas aeruginosa NOT DETECTED NOT DETECTED Final   Stenotrophomonas maltophilia NOT DETECTED NOT DETECTED Final   Candida albicans NOT DETECTED NOT DETECTED Final   Candida auris NOT DETECTED NOT DETECTED Final   Candida glabrata NOT DETECTED NOT DETECTED Final   Candida krusei NOT DETECTED NOT DETECTED Final   Candida parapsilosis NOT DETECTED NOT DETECTED Final   Candida tropicalis NOT DETECTED NOT DETECTED Final   Cryptococcus neoformans/gattii NOT DETECTED NOT DETECTED Final   Meth resistant mecA/C and MREJ DETECTED (A) NOT DETECTED Final    Comment: CRITICAL RESULT CALLED TO, READ BACK BY AND  VERIFIED WITHGloris Manchester PHARMD 1425 03/14/20 A BROWNING Performed at [facility], [address] [place], [place] [address]   Resp Panel by RT-PCR (Flu A&B, Covid) Nasopharyngeal Swab     Status: None   Collection Time: 03/13/20  2:24 PM   Specimen: Nasopharyngeal Swab; Nasopharyngeal(NP) swabs in vial transport medium  Result Value Ref Range Status   SARS Coronavirus 2 by RT PCR NEGATIVE NEGATIVE Final    Comment: (NOTE) SARS-CoV-2 target nucleic acids are NOT DETECTED.  The SARS-CoV-2 RNA is generally detectable in upper respiratory specimens during the acute phase of infection. The lowest concentration of SARS-CoV-2 viral copies this assay can detect is 138 copies/mL. A negative result does not preclude SARS-Cov-2 infection and should not be used as the sole basis for treatment or other patient management decisions. A negative result may occur with  improper specimen collection/handling, submission of specimen other than nasopharyngeal swab, presence of viral mutation(s) within the areas targeted by this assay, and inadequate number of viral copies(<138 copies/mL). A negative result must be combined with clinical observations, patient history, and epidemiological information. The expected result is Negative.  Fact Sheet for Patients:  EntrepreneurPulse.com.au  Fact Sheet for Healthcare Providers:  IncredibleEmployment.be  This test is no t yet approved or cleared by the [place] FDA and  has been authorized for detection and/or diagnosis of SARS-CoV-2 by FDA under an Emergency Use Authorization (EUA). This EUA will remain  in effect (meaning this test can be used) for the duration of the COVID-19 declaration under Section 564(b)(1) of the Act, 21 U.S.C.section 360bbb-3(b)(1), unless the authorization is terminated  or revoked sooner.       Influenza A by PCR NEGATIVE NEGATIVE Final   Influenza B by PCR NEGATIVE NEGATIVE Final     Comment: (NOTE) The Xpert Xpress SARS-CoV-2/FLU/RSV plus assay is intended as an aid in the diagnosis of influenza from Nasopharyngeal swab specimens and should not be used as a sole basis for treatment. Nasal washings and aspirates are unacceptable for Xpert Xpress SARS-CoV-2/FLU/RSV testing.  Fact Sheet for Patients: EntrepreneurPulse.com.au  Fact Sheet for Healthcare Providers: IncredibleEmployment.be  This test is not yet approved or cleared by the [place] FDA and has been authorized for detection and/or diagnosis of SARS-CoV-2 by FDA under an Emergency Use Authorization (EUA). This EUA will remain in effect (meaning this test can be used) for the duration of the COVID-19 declaration under Section 564(b)(1) of the Act, 21 U.S.C. section 360bbb-3(b)(1), unless the authorization is terminated or  revoked.  Performed at [facility], [address], [place], [place] [address]   MRSA PCR Screening     Status: None   Collection Time: 03/14/20  2:01 PM   Specimen: Nasal Mucosa; Nasopharyngeal  Result Value Ref Range Status   MRSA by PCR NEGATIVE NEGATIVE Final    Comment:        The GeneXpert MRSA Assay (FDA approved for NASAL specimens only), is one component of a comprehensive MRSA colonization surveillance program. It is not intended to diagnose MRSA infection nor to guide or monitor treatment for MRSA infections. Performed at [facility], [address] [place], [place] [address]   Culture, blood (routine x 2)     Status: None   Collection Time: 03/14/20  2:05 PM   Specimen: BLOOD  Result Value Ref Range Status   Specimen Description BLOOD LEFT ANTECUBITAL  Final   Special Requests   Final    Blood Culture adequate volume BOTTLES DRAWN AEROBIC AND ANAEROBIC   Culture   Final    NO GROWTH 5 DAYS Performed at [facility], [address] [place],  [address]    Report Status 03/19/2020 FINAL  Final  Culture, blood  (routine x 2)     Status: None   Collection Time: 03/14/20  2:09 PM   Specimen: BLOOD  Result Value Ref Range Status   Specimen Description BLOOD BLOOD LEFT HAND  Final   Special Requests   Final    Blood Culture adequate volume BOTTLES DRAWN AEROBIC AND ANAEROBIC   Culture   Final    NO GROWTH 5 DAYS Performed at [facility], [address], [place],  [address]    Report Status 03/19/2020 FINAL  Final     Labs: BNP (last 3 results) No results for input(s): BNP in the last 8760 hours. Basic Metabolic Panel: Recent Labs  Lab 03/15/20 0616 03/16/20 0500 03/17/20 0500 03/19/20 0509 03/20/20 0522  NA 137 134* 134* 136 135  K 3.6 2.9* 4.4 3.2* 4.1  CL 107 104 102 102 103  CO2 '25 23 22 26 25  ' GLUCOSE 97 98 86 100* 90  BUN 13 6* 5* 9 7*  CREATININE 0.39* 0.34* 0.36* 0.39* 0.51  CALCIUM 7.8* 7.5* 7.7* 7.9* 7.9*  MG 1.6* 1.6* 1.8 1.5* 1.7   Liver Function Tests: No results for input(s): AST, ALT, ALKPHOS, BILITOT, PROT, ALBUMIN in the last 168 hours. No results for input(s): LIPASE, AMYLASE in the last 168 hours. No results for input(s): AMMONIA in the last 168 hours. CBC: Recent Labs  Lab 03/15/20 0616 03/16/20 0500 03/17/20 0500 03/19/20 0509  WBC 13.6* 15.1* 15.3* 10.1  HGB 10.3* 11.1* 12.1 11.5*  HCT 31.0* 32.5* 35.1* 33.6*  MCV 100.0 96.7 98.0 98.5  PLT 279 288 333 363   Cardiac Enzymes: No results for input(s): CKTOTAL, CKMB, CKMBINDEX, TROPONINI in the last 168 hours. BNP: Invalid input(s): POCBNP CBG: No results for input(s): GLUCAP in the last 168 hours. D-Dimer No results for input(s): DDIMER in the last 72 hours. Hgb A1c No results for input(s): HGBA1C in the last 72 hours. Lipid Profile No results for input(s): CHOL, HDL, LDLCALC, TRIG, CHOLHDL, LDLDIRECT in the last 72 hours. Thyroid function studies No results for input(s): TSH, T4TOTAL, T3FREE, THYROIDAB in the last 72 hours.  Invalid input(s): FREET3 Anemia work up No results for  input(s): VITAMINB12, FOLATE, FERRITIN, TIBC, IRON, RETICCTPCT in the last 72 hours. Urinalysis    Component Value Date/Time   COLORURINE  AMBER (A) 03/13/2020 1020   APPEARANCEUR HAZY (A) 03/13/2020 1020   LABSPEC 1.017 03/13/2020 1020   PHURINE 6.0 03/13/2020 1020   GLUCOSEU NEGATIVE 03/13/2020 1020   HGBUR NEGATIVE 03/13/2020 1020   BILIRUBINUR NEGATIVE 03/13/2020 1020   KETONESUR 20 (A) 03/13/2020 1020   PROTEINUR 100 (A) 03/13/2020 1020   NITRITE NEGATIVE 03/13/2020 1020   LEUKOCYTESUR NEGATIVE 03/13/2020 1020   Sepsis Labs Invalid input(s): PROCALCITONIN,  WBC,  LACTICIDVEN Microbiology Recent Results (from the past 240 hour(s))  Urine culture     Status: Abnormal   Collection Time: 03/13/20 10:25 AM   Specimen: In/Out Cath Urine  Result Value Ref Range Status   Specimen Description   Final    IN/OUT CATH URINE Performed at [facility], [address], [place], [place] [address]    Special Requests   Final    NONE Performed at [facility], [address] [place], [place] [address]    Culture MULTIPLE SPECIES PRESENT, SUGGEST RECOLLECTION (A)  Final   Report Status 03/15/2020 FINAL  Final  SARS CORONAVIRUS 2 (TAT 6-24 HRS) Nasopharyngeal Nasopharyngeal Swab     Status: None   Collection Time: 03/13/20 11:07 AM   Specimen: Nasopharyngeal Swab  Result Value Ref Range Status   SARS Coronavirus 2 NEGATIVE NEGATIVE Final    Comment: (NOTE) SARS-CoV-2 target nucleic acids are NOT DETECTED.  The SARS-CoV-2 RNA is generally detectable in upper and lower respiratory specimens during the acute phase of infection. Negative results do not preclude SARS-CoV-2 infection, do not rule out co-infections with other pathogens, and should not be used as the sole basis for treatment or other patient management decisions. Negative results must be combined with clinical observations, patient history, and epidemiological information. The expected result is Negative.  Fact Sheet for  Patients: SugarRoll.be  Fact Sheet for Healthcare Providers: https://www.woods-mathews.com/  This test is not yet approved or cleared by the [place] FDA and  has been authorized for detection and/or diagnosis of SARS-CoV-2 by FDA under an Emergency Use Authorization (EUA). This EUA will remain  in effect (meaning this test can be used) for the duration of the COVID-19 declaration under Se ction 564(b)(1) of the Act, 21 U.S.C. section 360bbb-3(b)(1), unless the authorization is terminated or revoked sooner.  Performed at [facility], [place] [address] [place], [place] [address]   Blood culture (routine single)     Status: Abnormal   Collection Time: 03/13/20 11:22 AM   Specimen: BLOOD  Result Value Ref Range Status   Specimen Description   Final    BLOOD RIGHT ANTECUBITAL Performed at [facility], [address], [place], [place] [address]    Special Requests   Final    Blood Culture results may not be optimal due to an excessive volume of blood received in culture bottles BOTTLES DRAWN AEROBIC AND ANAEROBIC Performed at [facility], [address], [place], [place] [address]    Culture  Setup Time   Final    GRAM POSITIVE COCCI IN CLUSTERS AEROBIC BOTTLE ONLY Organism ID to follow CRITICAL RESULT CALLED TO, READ BACK BY AND VERIFIED WITHGloris Manchester ERDEYC 1448 03/14/20 A BROWNING Performed at [facility], [place] [address], [place], [place] [address]    Culture METHICILLIN RESISTANT STAPHYLOCOCCUS AUREUS (A)  Final   Report Status 03/16/2020 FINAL  Final   Organism ID, Bacteria METHICILLIN RESISTANT STAPHYLOCOCCUS AUREUS  Final      Susceptibility   Methicillin resistant staphylococcus aureus - MIC*  CIPROFLOXACIN >=8 RESISTANT Resistant     ERYTHROMYCIN <=0.25 SENSITIVE Sensitive     GENTAMICIN <=0.5 SENSITIVE Sensitive     OXACILLIN >=4 RESISTANT Resistant     TETRACYCLINE <=1 SENSITIVE Sensitive     VANCOMYCIN 1  SENSITIVE Sensitive     TRIMETH/SULFA <=10 SENSITIVE Sensitive     CLINDAMYCIN <=0.25 SENSITIVE Sensitive     RIFAMPIN <=0.5 SENSITIVE Sensitive     Inducible Clindamycin NEGATIVE Sensitive     * METHICILLIN RESISTANT STAPHYLOCOCCUS AUREUS  Blood Culture ID Panel (Reflexed)     Status: Abnormal   Collection Time: 03/13/20 11:22 AM  Result Value Ref Range Status   Enterococcus faecalis NOT DETECTED NOT DETECTED Final   Enterococcus Faecium NOT DETECTED NOT DETECTED Final   Listeria monocytogenes NOT DETECTED NOT DETECTED Final   Staphylococcus species DETECTED (A) NOT DETECTED Final    Comment: CRITICAL RESULT CALLED TO, READ BACK BY AND VERIFIED WITHGloris Manchester PHARMD 1425 03/14/20 A BROWNING    Staphylococcus aureus (BCID) DETECTED (A) NOT DETECTED Final    Comment: Methicillin (oxacillin)-resistant Staphylococcus aureus (MRSA). MRSA is predictably resistant to beta-lactam antibiotics (except ceftaroline). Preferred therapy is vancomycin unless clinically contraindicated. Patient requires contact precautions if  hospitalized. CRITICAL RESULT CALLED TO, READ BACK BY AND VERIFIED WITH: Gloris Manchester PHARMD 1425 03/14/20 A BROWNING    Staphylococcus epidermidis NOT DETECTED NOT DETECTED Final   Staphylococcus lugdunensis NOT DETECTED NOT DETECTED Final   Streptococcus species NOT DETECTED NOT DETECTED Final   Streptococcus agalactiae NOT DETECTED NOT DETECTED Final   Streptococcus pneumoniae NOT DETECTED NOT DETECTED Final   Streptococcus pyogenes NOT DETECTED NOT DETECTED Final   A.calcoaceticus-baumannii NOT DETECTED NOT DETECTED Final   Bacteroides fragilis NOT DETECTED NOT DETECTED Final   Enterobacterales NOT DETECTED NOT DETECTED Final   Enterobacter cloacae complex NOT DETECTED NOT DETECTED Final   Escherichia coli NOT DETECTED NOT DETECTED Final   Klebsiella aerogenes NOT DETECTED NOT DETECTED Final   Klebsiella oxytoca NOT DETECTED NOT DETECTED Final   Klebsiella pneumoniae NOT  DETECTED NOT DETECTED Final   Proteus species NOT DETECTED NOT DETECTED Final   Salmonella species NOT DETECTED NOT DETECTED Final   Serratia marcescens NOT DETECTED NOT DETECTED Final   Haemophilus influenzae NOT DETECTED NOT DETECTED Final   Neisseria meningitidis NOT DETECTED NOT DETECTED Final   Pseudomonas aeruginosa NOT DETECTED NOT DETECTED Final   Stenotrophomonas maltophilia NOT DETECTED NOT DETECTED Final   Candida albicans NOT DETECTED NOT DETECTED Final   Candida auris NOT DETECTED NOT DETECTED Final   Candida glabrata NOT DETECTED NOT DETECTED Final   Candida krusei NOT DETECTED NOT DETECTED Final   Candida parapsilosis NOT DETECTED NOT DETECTED Final   Candida tropicalis NOT DETECTED NOT DETECTED Final   Cryptococcus neoformans/gattii NOT DETECTED NOT DETECTED Final   Meth resistant mecA/C and MREJ DETECTED (A) NOT DETECTED Final    Comment: CRITICAL RESULT CALLED TO, READ BACK BY AND VERIFIED WITHGloris Manchester PHARMD 1425 03/14/20 A BROWNING Performed at [facility], [address], [place], [place] [address]   Resp Panel by RT-PCR (Flu A&B, Covid) Nasopharyngeal Swab     Status: None   Collection Time: 03/13/20  2:24 PM   Specimen: Nasopharyngeal Swab; Nasopharyngeal(NP) swabs in vial transport medium  Result Value Ref Range Status   SARS Coronavirus 2 by RT PCR NEGATIVE NEGATIVE Final    Comment: (NOTE) SARS-CoV-2 target nucleic acids are NOT DETECTED.  The SARS-CoV-2 RNA is generally detectable in  upper respiratory specimens during the acute phase of infection. The lowest concentration of SARS-CoV-2 viral copies this assay can detect is 138 copies/mL. A negative result does not preclude SARS-Cov-2 infection and should not be used as the sole basis for treatment or other patient management decisions. A negative result may occur with  improper specimen collection/handling, submission of specimen other than nasopharyngeal swab, presence of viral mutation(s) within  the areas targeted by this assay, and inadequate number of viral copies(<138 copies/mL). A negative result must be combined with clinical observations, patient history, and epidemiological information. The expected result is Negative.  Fact Sheet for Patients:  EntrepreneurPulse.com.au  Fact Sheet for Healthcare Providers:  IncredibleEmployment.be  This test is no t yet approved or cleared by the [place] FDA and  has been authorized for detection and/or diagnosis of SARS-CoV-2 by FDA under an Emergency Use Authorization (EUA). This EUA will remain  in effect (meaning this test can be used) for the duration of the COVID-19 declaration under Section 564(b)(1) of the Act, 21 U.S.C.section 360bbb-3(b)(1), unless the authorization is terminated  or revoked sooner.       Influenza A by PCR NEGATIVE NEGATIVE Final   Influenza B by PCR NEGATIVE NEGATIVE Final    Comment: (NOTE) The Xpert Xpress SARS-CoV-2/FLU/RSV plus assay is intended as an aid in the diagnosis of influenza from Nasopharyngeal swab specimens and should not be used as a sole basis for treatment. Nasal washings and aspirates are unacceptable for Xpert Xpress SARS-CoV-2/FLU/RSV testing.  Fact Sheet for Patients: EntrepreneurPulse.com.au  Fact Sheet for Healthcare Providers: IncredibleEmployment.be  This test is not yet approved or cleared by the [place] FDA and has been authorized for detection and/or diagnosis of SARS-CoV-2 by FDA under an Emergency Use Authorization (EUA). This EUA will remain in effect (meaning this test can be used) for the duration of the COVID-19 declaration under Section 564(b)(1) of the Act, 21 U.S.C. section 360bbb-3(b)(1), unless the authorization is terminated or revoked.  Performed at [facility], [address], [place], [place] [address]   MRSA PCR Screening     Status: None   Collection Time:  03/14/20  2:01 PM   Specimen: Nasal Mucosa; Nasopharyngeal  Result Value Ref Range Status   MRSA by PCR NEGATIVE NEGATIVE Final    Comment:        The GeneXpert MRSA Assay (FDA approved for NASAL specimens only), is one component of a comprehensive MRSA colonization surveillance program. It is not intended to diagnose MRSA infection nor to guide or monitor treatment for MRSA infections. Performed at [facility], [address], [place], [place] [address]   Culture, blood (routine x 2)     Status: None   Collection Time: 03/14/20  2:05 PM   Specimen: BLOOD  Result Value Ref Range Status   Specimen Description BLOOD LEFT ANTECUBITAL  Final   Special Requests   Final    Blood Culture adequate volume BOTTLES DRAWN AEROBIC AND ANAEROBIC   Culture   Final    NO GROWTH 5 DAYS Performed at [facility], [address], [place], [place] [address]    Report Status 03/19/2020 FINAL  Final  Culture, blood (routine x 2)     Status: None   Collection Time: 03/14/20  2:09 PM   Specimen: BLOOD  Result Value Ref Range Status   Specimen Description BLOOD BLOOD LEFT HAND  Final   Special Requests   Final    Blood Culture adequate volume BOTTLES DRAWN AEROBIC AND  ANAEROBIC   Culture   Final    NO GROWTH 5 DAYS Performed at [facility], [address] [place], [place] [address]    Report Status 03/19/2020 FINAL  Final     Time coordinating discharge: Over 30 minutes  SIGNED:   Darliss Cheney, MD  [facility] 03/21/2020, 10:40 AM  If 7PM-7AM, please contact night-coverage [email or web address]

## 2020-03-20 NOTE — Progress Notes (Signed)
PROGRESS NOTE    Faith [name]  [id number] DOB: May 09, 1955 DOA: 03/13/2020 PCP: Doree Albee, MD   Brief Narrative:  Faith [name] a 65 y.o.femalewith medical history significant formultiple sclerosis, COPD, pressure ulcer, hypertension. Patient was brought to the ED via EMS with complaints of fall, and family not being able to take care of her.Patient was admitted with SIRS criteria and decubitus ulcer with concern for need for debridement. She has had some worsening weakness and falls. She is now noted to have MRSA bacteremia.She hasbeen started on ciprofloxacin and Flagyl for empiric treatment of appendicitisper GS.  Patient has undergone transesophageal echocardiogram on 1/17 with no findings of vegetations.  ID recommends 4 weeks of IV antibiotics.  PICC line placed 1/17. SNF placement pending.  Assessment & Plan:   Principal Problem:   Decubital ulcer Active Problems:   Falls frequently   Multiple sclerosis (HCC)   SIRS (systemic inflammatory response syndrome) (HCC)   Sacral decubitus ulcer, stage II (HCC)   Acute appendicitis   Bacteremia  MRSA bacteremia -Decubitus ulcer noted, but no other obvious source -Continue vancomycin for total 4 weeks per ID; OPAT placed 1/18 -Repeat blood culturesnegative -2D echocardiogram TTE performed without any signs of vegetations. LVEF 60 to 65% -TEE performed on 1/17 with no vegetations -No new back pain -PICC line placed to RUE on 0000000 -Plan [facility] [id number]  Right buttock decubitus ulcer -Appears to be mostly stage II with no need for debridement per general surgery -Continuediet -IV morphine as needed for pain control -Continue wound care protocols -Air Mattress recommended  Questionable early appendicitis -Without significant abdominal pain/N/V at this time; no need for surgical intervention -Cipro and Flagyl started per GS continue for total of 1 week -Serial abdominal exams per GS with  outpatient follow-up  Multiple sclerosis -Multiple falls which may be related -PT evaluation recommending SNF which patient is agreeable to -Continue home pain medications  Hypertension -Propranolol  Hypokalemia/Hypomagnesemia -resolved.  DVT prophylaxis:SCDs Code Status:Full Family Communication:Patient's daughter at the bedside. Disposition Plan: Status is: Inpatient  Remains inpatient appropriate because:IV treatments appropriate due to intensity of illness or inability to take PO and Inpatient level of care appropriate due to severity of illness   Dispo: The patient is from:Home Anticipated d/c is to:SNF Anticipated d/c date is:1 days Patient currently is medically stable to d/c. SNF placement pending. Continue antibiotics as ordered.   Consultants:  General surgery  Cardiology for TEE  Discussed with ID 1/17  Procedures:  See below   Nutritional Assessment:  The patient's BMI is: Body mass index is 16.65 kg/m.Marland Kitchen  Skin Assessment:  I have examined the patient's skin and I agree with the wound assessment as performed by the wound care RN as outlined below:  Pressure Injury 03/13/20 Sacrum Medial Deep Tissue Pressure Injury - Purple or maroon localized area of discolored intact skin or blood-filled blister due to damage of underlying soft tissue from pressure and/or shear. 6.5 cm x 5.5 cm (Active)  03/13/20 1800  Location: Sacrum  Location Orientation: Medial  Staging: Deep Tissue Pressure Injury - Purple or maroon localized area of discolored intact skin or blood-filled blister due to damage of underlying soft tissue from pressure and/or shear.  Wound Description (Comments): 6.5 cm x 5.5 cm  Present on Admission: Yes     Pressure Injury 03/13/20 Heel Left;Medial Stage 1 -  Intact skin with non-blanchable redness of a localized area usually over a bony prominence. 3 cm x 2.5  cm (Active)   03/13/20 1800  Location: Heel  Location Orientation: Left;Medial  Staging: Stage 1 -  Intact skin with non-blanchable redness of a localized area usually over a bony prominence.  Wound Description (Comments): 3 cm x 2.5 cm  Present on Admission: Yes    Antimicrobials:  Anti-infectives (From admission, onward)   Start     Dose/Rate Route Frequency Ordered Stop   03/19/20 1400  metroNIDAZOLE (FLAGYL) tablet 500 mg  Status:  Discontinued        500 mg Oral Every 8 hours 03/19/20 1314 03/19/20 1316   03/19/20 1400  metroNIDAZOLE (FLAGYL) tablet 500 mg        500 mg Oral Every 8 hours 03/19/20 1316 03/22/20 0559   03/19/20 1330  ciprofloxacin (CIPRO) tablet 500 mg  Status:  Discontinued        500 mg Oral 2 times daily 03/19/20 1314 03/19/20 1316   03/19/20 1330  ciprofloxacin (CIPRO) tablet 500 mg        500 mg Oral 2 times daily 03/19/20 1316 03/22/20 0759   03/18/20 1730  vancomycin (VANCOREADY) IVPB 1500 mg/300 mL        1,500 mg 150 mL/hr over 120 Minutes Intravenous Every 24 hours 03/18/20 1658     03/15/20 1300  ciprofloxacin (CIPRO) IVPB 400 mg  Status:  Discontinued        400 mg 200 mL/hr over 60 Minutes Intravenous Every 12 hours 03/15/20 1212 03/19/20 1314   03/15/20 1300  metroNIDAZOLE (FLAGYL) IVPB 500 mg  Status:  Discontinued        500 mg 100 mL/hr over 60 Minutes Intravenous Every 8 hours 03/15/20 1212 03/19/20 1314   03/14/20 1445  vancomycin (VANCOREADY) IVPB 750 mg/150 mL  Status:  Discontinued        750 mg 150 mL/hr over 60 Minutes Intravenous Every 24 hours 03/14/20 1358 03/18/20 1658   03/14/20 1200  vancomycin (VANCOREADY) IVPB 750 mg/150 mL  Status:  Discontinued        750 mg 150 mL/hr over 60 Minutes Intravenous Every 24 hours 03/13/20 1253 03/13/20 1803   03/13/20 2300  cefTRIAXone (ROCEPHIN) 1 g in sodium chloride 0.9 % 100 mL IVPB  Status:  Discontinued        1 g 200 mL/hr over 30 Minutes Intravenous Every 24 hours 03/13/20 1803 03/14/20 1516    03/13/20 1145  ceFEPIme (MAXIPIME) 2 g in sodium chloride 0.9 % 100 mL IVPB        2 g 200 mL/hr over 30 Minutes Intravenous  Once 03/13/20 1130 03/13/20 1313   03/13/20 1145  metroNIDAZOLE (FLAGYL) IVPB 500 mg        500 mg 100 mL/hr over 60 Minutes Intravenous  Once 03/13/20 1130 03/13/20 1311   03/13/20 1115  levofloxacin (LEVAQUIN) IVPB 750 mg  Status:  Discontinued        750 mg 100 mL/hr over 90 Minutes Intravenous  Once 03/13/20 1109 03/13/20 1127   03/13/20 1115  aztreonam (AZACTAM) 2 g in sodium chloride 0.9 % 100 mL IVPB  Status:  Discontinued        2 g 200 mL/hr over 30 Minutes Intravenous  Once 03/13/20 1109 03/13/20 1132   03/13/20 1115  vancomycin (VANCOCIN) IVPB 1000 mg/200 mL premix        1,000 mg 200 mL/hr over 60 Minutes Intravenous  Once 03/13/20 1109 03/13/20 1307      Subjective: Patient seen and examined.  Daughter at the  bedside.  Patient has no complaints.  Objective: Vitals:   03/19/20 1911 03/19/20 2110 03/20/20 0601 03/20/20 1420  BP: 135/81 129/84 123/78 121/78  Pulse: 69 73 72 75  Resp: 20 17 17 20   Temp: 98.1 F (36.7 C) 98.5 F (36.9 C) 98.4 F (36.9 C) 97.7 F (36.5 C)  TempSrc: Oral  Oral Oral  SpO2: 98% 97% 96% 96%  Weight:      Height:        Intake/Output Summary (Last 24 hours) at 03/20/2020 1424 Last data filed at 03/20/2020 0932 Gross per 24 hour  Intake 1452.3 ml  Output 2550 ml  Net -1097.7 ml   Filed Weights   03/13/20 1000 03/13/20 1755  Weight: 45.8 kg 44 kg    Examination:  General exam: Appears calm and comfortable  Respiratory system: Clear to auscultation. Respiratory effort normal. Cardiovascular system: S1 & S2 heard, RRR. No JVD, murmurs, rubs, gallops or clicks. No pedal edema. Gastrointestinal system: Abdomen is nondistended, soft and nontender. No organomegaly or masses felt. Normal bowel sounds heard. Central nervous system: Alert and oriented. No focal neurological deficits. Extremities: Symmetric 5 x 5  power. Skin: No rashes, lesions or ulcers.  Psychiatry: Judgement and insight appear normal. Mood & affect appropriate.   Data Reviewed: I have personally reviewed following labs and imaging studies  CBC: Recent Labs  Lab 03/14/20 0544 03/15/20 0616 03/16/20 0500 03/17/20 0500 03/19/20 0509  WBC 21.6* 13.6* 15.1* 15.3* 10.1  HGB 10.7* 10.3* 11.1* 12.1 11.5*  HCT 31.7* 31.0* 32.5* 35.1* 33.6*  MCV 97.8 100.0 96.7 98.0 98.5  PLT 267 279 288 333 [id number]   Basic Metabolic Panel: Recent Labs  Lab 03/15/20 0616 03/16/20 0500 03/17/20 0500 03/19/20 0509 03/20/20 0522  NA 137 134* 134* 136 135  K 3.6 2.9* 4.4 3.2* 4.1  CL 107 104 102 102 103  CO2 25 23 22 26 25   GLUCOSE 97 98 86 100* 90  BUN 13 6* 5* 9 7*  CREATININE 0.39* 0.34* 0.36* 0.39* 0.51  CALCIUM 7.8* 7.5* 7.7* 7.9* 7.9*  MG 1.6* 1.6* 1.8 1.5* 1.7   GFR: Estimated Creatinine Clearance: 50 mL/min (by C-G formula based on SCr of 0.51 mg/dL). Liver Function Tests: No results for input(s): AST, ALT, ALKPHOS, BILITOT, PROT, ALBUMIN in the last 168 hours. No results for input(s): LIPASE, AMYLASE in the last 168 hours. No results for input(s): AMMONIA in the last 168 hours. Coagulation Profile: No results for input(s): INR, PROTIME in the last 168 hours. Cardiac Enzymes: No results for input(s): CKTOTAL, CKMB, CKMBINDEX, TROPONINI in the last 168 hours. BNP (last 3 results) No results for input(s): PROBNP in the last 8760 hours. HbA1C: No results for input(s): HGBA1C in the last 72 hours. CBG: No results for input(s): GLUCAP in the last 168 hours. Lipid Profile: No results for input(s): CHOL, HDL, LDLCALC, TRIG, CHOLHDL, LDLDIRECT in the last 72 hours. Thyroid Function Tests: No results for input(s): TSH, T4TOTAL, FREET4, T3FREE, THYROIDAB in the last 72 hours. Anemia Panel: No results for input(s): VITAMINB12, FOLATE, FERRITIN, TIBC, IRON, RETICCTPCT in the last 72 hours. Sepsis Labs: No results for input(s):  PROCALCITON, LATICACIDVEN in the last 168 hours.  Recent Results (from the past 240 hour(s))  Urine culture     Status: Abnormal   Collection Time: 03/13/20 10:25 AM   Specimen: In/Out Cath Urine  Result Value Ref Range Status   Specimen Description   Final    IN/OUT CATH URINE Performed at [facility], [address], [place], [place] [address]    Special Requests   Final    NONE Performed at [facility], [address] [place], [place] [address]    Culture MULTIPLE SPECIES PRESENT, SUGGEST RECOLLECTION (A)  Final   Report Status 03/15/2020 FINAL  Final  SARS CORONAVIRUS 2 (TAT 6-24 HRS) Nasopharyngeal Nasopharyngeal Swab     Status: None   Collection Time: 03/13/20 11:07 AM   Specimen: Nasopharyngeal Swab  Result Value Ref Range Status   SARS Coronavirus 2 NEGATIVE NEGATIVE Final    Comment: (NOTE) SARS-CoV-2 target nucleic acids are NOT DETECTED.  The SARS-CoV-2 RNA is generally detectable in upper and lower respiratory specimens during the acute phase of infection. Negative results do not preclude SARS-CoV-2 infection, do not rule out co-infections with other pathogens, and should not be used as the sole basis for treatment or other patient management decisions. Negative results must be combined with clinical observations, patient history, and epidemiological information. The expected result is Negative.  Fact Sheet for Patients: SugarRoll.be  Fact Sheet for Healthcare Providers: https://www.woods-mathews.com/  This test is not yet approved or cleared by the [place] FDA and  has been authorized for detection and/or diagnosis of SARS-CoV-2 by FDA under an Emergency Use Authorization (EUA). This EUA will remain  in effect (meaning this test can be used) for the duration of the COVID-19 declaration under Se ction 564(b)(1) of the Act, 21 U.S.C. section 360bbb-3(b)(1), unless the authorization is terminated or revoked  sooner.  Performed at [facility], [place] [address], [place], [place] [address]   Blood culture (routine single)     Status: Abnormal   Collection Time: 03/13/20 11:22 AM   Specimen: BLOOD  Result Value Ref Range Status   Specimen Description   Final    BLOOD RIGHT ANTECUBITAL Performed at [facility], [address], [place], [place] [address]    Special Requests   Final    Blood Culture results may not be optimal due to an excessive volume of blood received in culture bottles BOTTLES DRAWN AEROBIC AND ANAEROBIC Performed at [facility], [address], [place], [place] [address]    Culture  Setup Time   Final    GRAM POSITIVE COCCI IN CLUSTERS AEROBIC BOTTLE ONLY Organism ID to follow CRITICAL RESULT CALLED TO, READ BACK BY AND VERIFIED WITHGloris Manchester [id number] 03/14/20 A BROWNING Performed at [facility], [place] [address], [place], [place] [address]    Culture METHICILLIN RESISTANT STAPHYLOCOCCUS AUREUS (A)  Final   Report Status 03/16/2020 FINAL  Final   Organism ID, Bacteria METHICILLIN RESISTANT STAPHYLOCOCCUS AUREUS  Final      Susceptibility   Methicillin resistant staphylococcus aureus - MIC*    CIPROFLOXACIN >=8 RESISTANT Resistant     ERYTHROMYCIN <=0.25 SENSITIVE Sensitive     GENTAMICIN <=0.5 SENSITIVE Sensitive     OXACILLIN >=4 RESISTANT Resistant     TETRACYCLINE <=1 SENSITIVE Sensitive     VANCOMYCIN 1 SENSITIVE Sensitive     TRIMETH/SULFA <=10 SENSITIVE Sensitive     CLINDAMYCIN <=0.25 SENSITIVE Sensitive     RIFAMPIN <=0.5 SENSITIVE Sensitive     Inducible Clindamycin NEGATIVE Sensitive     * METHICILLIN RESISTANT STAPHYLOCOCCUS AUREUS  Blood Culture ID Panel (Reflexed)     Status: Abnormal   Collection Time: 03/13/20 11:22 AM  Result Value Ref Range Status   Enterococcus faecalis NOT DETECTED NOT DETECTED Final   Enterococcus Faecium NOT DETECTED NOT DETECTED Final  Listeria monocytogenes NOT DETECTED NOT DETECTED Final   Staphylococcus  species DETECTED (A) NOT DETECTED Final    Comment: CRITICAL RESULT CALLED TO, READ BACK BY AND VERIFIED WITHGloris Manchester PHARMD 1425 03/14/20 A BROWNING    Staphylococcus aureus (BCID) DETECTED (A) NOT DETECTED Final    Comment: Methicillin (oxacillin)-resistant Staphylococcus aureus (MRSA). MRSA is predictably resistant to beta-lactam antibiotics (except ceftaroline). Preferred therapy is vancomycin unless clinically contraindicated. Patient requires contact precautions if  hospitalized. CRITICAL RESULT CALLED TO, READ BACK BY AND VERIFIED WITH: Gloris Manchester PHARMD 1425 03/14/20 A BROWNING    Staphylococcus epidermidis NOT DETECTED NOT DETECTED Final   Staphylococcus lugdunensis NOT DETECTED NOT DETECTED Final   Streptococcus species NOT DETECTED NOT DETECTED Final   Streptococcus agalactiae NOT DETECTED NOT DETECTED Final   Streptococcus pneumoniae NOT DETECTED NOT DETECTED Final   Streptococcus pyogenes NOT DETECTED NOT DETECTED Final   A.calcoaceticus-baumannii NOT DETECTED NOT DETECTED Final   Bacteroides fragilis NOT DETECTED NOT DETECTED Final   Enterobacterales NOT DETECTED NOT DETECTED Final   Enterobacter cloacae complex NOT DETECTED NOT DETECTED Final   Escherichia coli NOT DETECTED NOT DETECTED Final   Klebsiella aerogenes NOT DETECTED NOT DETECTED Final   Klebsiella oxytoca NOT DETECTED NOT DETECTED Final   Klebsiella pneumoniae NOT DETECTED NOT DETECTED Final   Proteus species NOT DETECTED NOT DETECTED Final   Salmonella species NOT DETECTED NOT DETECTED Final   Serratia marcescens NOT DETECTED NOT DETECTED Final   Haemophilus influenzae NOT DETECTED NOT DETECTED Final   Neisseria meningitidis NOT DETECTED NOT DETECTED Final   Pseudomonas aeruginosa NOT DETECTED NOT DETECTED Final   Stenotrophomonas maltophilia NOT DETECTED NOT DETECTED Final   Candida albicans NOT DETECTED NOT DETECTED Final   Candida auris NOT DETECTED NOT DETECTED Final   Candida glabrata NOT DETECTED NOT  DETECTED Final   Candida krusei NOT DETECTED NOT DETECTED Final   Candida parapsilosis NOT DETECTED NOT DETECTED Final   Candida tropicalis NOT DETECTED NOT DETECTED Final   Cryptococcus neoformans/gattii NOT DETECTED NOT DETECTED Final   Meth resistant mecA/C and MREJ DETECTED (A) NOT DETECTED Final    Comment: CRITICAL RESULT CALLED TO, READ BACK BY AND VERIFIED WITHGloris Manchester PHARMD 1425 03/14/20 A BROWNING Performed at [facility], [address] [place], [place] [address]   Resp Panel by RT-PCR (Flu A&B, Covid) Nasopharyngeal Swab     Status: None   Collection Time: 03/13/20  2:24 PM   Specimen: Nasopharyngeal Swab; Nasopharyngeal(NP) swabs in vial transport medium  Result Value Ref Range Status   SARS Coronavirus 2 by RT PCR NEGATIVE NEGATIVE Final    Comment: (NOTE) SARS-CoV-2 target nucleic acids are NOT DETECTED.  The SARS-CoV-2 RNA is generally detectable in upper respiratory specimens during the acute phase of infection. The lowest concentration of SARS-CoV-2 viral copies this assay can detect is 138 copies/mL. A negative result does not preclude SARS-Cov-2 infection and should not be used as the sole basis for treatment or other patient management decisions. A negative result may occur with  improper specimen collection/handling, submission of specimen other than nasopharyngeal swab, presence of viral mutation(s) within the areas targeted by this assay, and inadequate number of viral copies(<138 copies/mL). A negative result must be combined with clinical observations, patient history, and epidemiological information. The expected result is Negative.  Fact Sheet for Patients:  EntrepreneurPulse.com.au  Fact Sheet for Healthcare Providers:  IncredibleEmployment.be  This test is no t yet approved or cleared by the [place]  FDA and  has been authorized for detection and/or diagnosis of SARS-CoV-2 by FDA under an Emergency  Use Authorization (EUA). This EUA will remain  in effect (meaning this test can be used) for the duration of the COVID-19 declaration under Section 564(b)(1) of the Act, 21 U.S.C.section 360bbb-3(b)(1), unless the authorization is terminated  or revoked sooner.       Influenza A by PCR NEGATIVE NEGATIVE Final   Influenza B by PCR NEGATIVE NEGATIVE Final    Comment: (NOTE) The Xpert Xpress SARS-CoV-2/FLU/RSV plus assay is intended as an aid in the diagnosis of influenza from Nasopharyngeal swab specimens and should not be used as a sole basis for treatment. Nasal washings and aspirates are unacceptable for Xpert Xpress SARS-CoV-2/FLU/RSV testing.  Fact Sheet for Patients: EntrepreneurPulse.com.au  Fact Sheet for Healthcare Providers: IncredibleEmployment.be  This test is not yet approved or cleared by the [place] FDA and has been authorized for detection and/or diagnosis of SARS-CoV-2 by FDA under an Emergency Use Authorization (EUA). This EUA will remain in effect (meaning this test can be used) for the duration of the COVID-19 declaration under Section 564(b)(1) of the Act, 21 U.S.C. section 360bbb-3(b)(1), unless the authorization is terminated or revoked.  Performed at [facility], [address], [place], [place] [address]   MRSA PCR Screening     Status: None   Collection Time: 03/14/20  2:01 PM   Specimen: Nasal Mucosa; Nasopharyngeal  Result Value Ref Range Status   MRSA by PCR NEGATIVE NEGATIVE Final    Comment:        The GeneXpert MRSA Assay (FDA approved for NASAL specimens only), is one component of a comprehensive MRSA colonization surveillance program. It is not intended to diagnose MRSA infection nor to guide or monitor treatment for MRSA infections. Performed at [facility], [address] [place], [place] [address]   Culture, blood (routine x 2)     Status: None   Collection Time: 03/14/20  2:05 PM    Specimen: BLOOD  Result Value Ref Range Status   Specimen Description BLOOD LEFT ANTECUBITAL  Final   Special Requests   Final    Blood Culture adequate volume BOTTLES DRAWN AEROBIC AND ANAEROBIC   Culture   Final    NO GROWTH 5 DAYS Performed at [facility], [address], [place], [place] [address]    Report Status 03/19/2020 FINAL  Final  Culture, blood (routine x 2)     Status: None   Collection Time: 03/14/20  2:09 PM   Specimen: BLOOD  Result Value Ref Range Status   Specimen Description BLOOD BLOOD LEFT HAND  Final   Special Requests   Final    Blood Culture adequate volume BOTTLES DRAWN AEROBIC AND ANAEROBIC   Culture   Final    NO GROWTH 5 DAYS Performed at [facility], [address] [place], [place] [address]    Report Status 03/19/2020 FINAL  Final    Radiology Studies: No results found.  Scheduled Meds: . atorvastatin  20 mg Oral Daily  . baclofen  10 mg Oral TID  . celecoxib  200 mg Oral BID  . Chlorhexidine Gluconate Cloth  6 each Topical Daily  . ciprofloxacin  500 mg Oral BID  . collagenase   Topical Daily  . gabapentin  600 mg Oral TID  . metroNIDAZOLE  500 mg Oral Q8H  . propranolol  20 mg Oral BID   Continuous Infusions: . vancomycin 1,500 mg (03/19/20 1836)  LOS: 7 days   Time spent: 30 minutes  Darliss Cheney, MD How to contact the [facility] Attending or Consulting provider [place] or covering provider during after hours [place], for this patient?  1. Check the care team in [facility] and look for a) attending/consulting [facility] provider listed and b) the [facility] team listed 2. Log into [email or web address] and use [place]'s universal password to access. If you do not have the password, please contact the hospital operator. 3. Locate the [facility] provider you are looking for under [facility] and page to a number that you can be directly reached. 4. If you still have difficulty reaching the provider, please page the [facility] (Director on Call) for the Hospitalists  listed on amion for assistance.  03/20/2020, 2:24 PM

## 2020-03-21 DIAGNOSIS — L89152 Pressure ulcer of sacral region, stage 2: Secondary | ICD-10-CM | POA: Diagnosis not present

## 2020-03-21 LAB — VANCOMYCIN, TROUGH: Vancomycin Tr: 4 ug/mL — ABNORMAL LOW (ref 15–20)

## 2020-03-21 NOTE — TOC Progression Note (Signed)
Transition of Care [facility]) - Progression Note   Patient Details  Name: Faith [name] MRN: [id number] Date of Birth: Dec 04, 1955  [facility] CM/SW [place], LCSW Phone Number: 03/21/2020, 4:28 PM  Clinical Narrative: CSW spoke with [name] at [facility] numerous times to discuss patient coming to [place] today. CSW notified by [name] that [facility] is not in-network with the patient's Medicaid. CSW updated patient, hospitalist, and RN.  Patient has no other bed offers at this time; referral was faxed out to SNFs in [place], [place], and [place] counties. Referral has been declined by 19 facilities. TOC to continue looking for SNF placement.  Expected Discharge Plan: [place] Barriers to Discharge: Continued Medical Work up  Expected Discharge Plan and Services Expected Discharge Plan: [place] In-house Referral: Clinical Social Work Post Acute Care Choice: [place] Living arrangements for the past 2 months: Single Family Home Expected Discharge Date: 03/21/20               DME Arranged: N/A  Readmission Risk Interventions Readmission Risk Prevention Plan 03/19/2020  Medication Screening Complete  Transportation Screening Complete  Some recent data might be hidden

## 2020-03-22 DIAGNOSIS — L89152 Pressure ulcer of sacral region, stage 2: Secondary | ICD-10-CM | POA: Diagnosis not present

## 2020-03-22 MED ORDER — VANCOMYCIN HCL 750 MG/150ML IV SOLN
750.0000 mg | Freq: Two times a day (BID) | INTRAVENOUS | Status: DC
  Administered 2020-03-22 – 2020-03-25 (×7): 750 mg via INTRAVENOUS
  Filled 2020-03-22 (×7): qty 150

## 2020-03-22 NOTE — Progress Notes (Signed)
PHARMACY CONSULT NOTE FOR:  OUTPATIENT  PARENTERAL ANTIBIOTIC THERAPY (OPAT)  Indication: MRSA Bacteremia Regimen: Vancomycin 750mg  IV q12h End date: 04/10/2020  IV antibiotic discharge orders are pended. To discharging provider:  please sign these orders via discharge navigator,  Select New Orders & click on the button choice - Manage This Unsigned Work.     Thank you for allowing pharmacy to be a part of this patient's care.  Thomasenia Sales, PharmD, MBA, BCGP Clinical Pharmacist  03/22/2020, 8:35 AM

## 2020-03-22 NOTE — Progress Notes (Signed)
Pharmacy Antibiotic Note  Faith [name] is a 65 y.o. female admitted on 03/13/2020 with bacteremia and intra abdominal infection.  Pharmacy has been consulted for Vancomycin dosing. VT reported 85mcg/ml, subtherapeutic. WIll adjust dose. Patient is improving and repeat BCXs are negative   Plan: Vancomycin 750mg  IV every 12 hours. Monitor labs, c/s, and further antibiotics.  Height: 5\' 4"  (162.6 cm) Weight: 44 kg (97 lb) IBW/kg (Calculated) : 54.7  Temp (24hrs), Avg:98.2 F (36.8 C), Min:98 F (36.7 C), Max:98.3 F (36.8 C)  Recent Labs  Lab 03/16/20 0500 03/17/20 0500 03/18/20 1509 03/19/20 0509 03/20/20 0522 03/21/20 1849  WBC 15.1* 15.3*  --  10.1  --   --   CREATININE 0.34* 0.36*  --  0.39* 0.51  --   VANCOTROUGH  --   --  <4*  --   --  4*    Estimated Creatinine Clearance: 49.3 mL/min (by C-G formula based on SCr of 0.51 mg/dL).    Allergies  Allergen Reactions  . Darvon [Propoxyphene] Other (See Comments)    Hallicuations  . Oxycodone-Acetaminophen     Does not have any affect on her  . Penicillins Rash    Did it involve swelling of the face/tongue/throat, SOB, or low BP? Unknown Did it involve sudden or severe rash/hives, skin peeling, or any reaction on the inside of your mouth or nose? Unknown Did you need to seek medical attention at a hospital or doctor's office? Unknown When did it last happen?Unknown If all above answers are "NO", may proceed with cephalosporin use.     Antimicrobials this admission: Vanco 1/13 >>  Cefepime 1/13 x 1 Flagyl 1/13>> cipro 1/15 >>  Rocephin 1/13>> 1/14  Microbiology results: 1/13 BCx: MRSA 1/14 BCX: ngtd 1/13 UCx: multiple species suggest recollection 1/13 Covid/flu: negative  Thank you for allowing pharmacy to be a part of this patient's care.  Thomasenia Sales, PharmD, MBA, BCGP Clinical Pharmacist  03/22/2020 8:07 AM

## 2020-03-22 NOTE — Progress Notes (Signed)
PROGRESS NOTE    Faith [name]  [id number] DOB: 1955/03/08 DOA: 03/13/2020 PCP: Doree Albee, MD   Brief Narrative:  Faith Bailiff Brownis a 65 y.o.femalewith medical history significant formultiple sclerosis, COPD, pressure ulcer, hypertension. Patient was brought to the ED via EMS with complaints of fall, and family not being able to take care of her.Patient was admitted with SIRS criteria and decubitus ulcer with concern for need for debridement. She has had some worsening weakness and falls. She is now noted to have MRSA bacteremia.She hasbeen started on ciprofloxacin and Flagyl for empiric treatment of appendicitisper GS.  Patient has undergone transesophageal echocardiogram on 1/17 with no findings of vegetations.  ID recommends 4 weeks of IV antibiotics.  PICC line placed 1/17. SNF placement pending.  Assessment & Plan:   Principal Problem:   Decubital ulcer Active Problems:   Falls frequently   Multiple sclerosis (HCC)   SIRS (systemic inflammatory response syndrome) (HCC)   Sacral decubitus ulcer, stage II (HCC)   Acute appendicitis   Bacteremia  MRSA bacteremia -Decubitus ulcer noted, but no other obvious source -Continue vancomycin for total 4 weeks per ID; OPAT placed 1/18 -Repeat blood culturesnegative -2D echocardiogram TTE performed without any signs of vegetations. LVEF 60 to 65% -TEE performed on 1/17 with no vegetations -No new back pain -PICC line placed to RUE on 1/17.  Plan to continue IV antibiotics through 04/10/2020.  Right buttock decubitus ulcer -Appears to be mostly stage II with no need for debridement per general surgery -Continuediet -IV morphine as needed for pain control -Continue wound care protocols -Air Mattress recommended  Questionable early appendicitis -Without significant abdominal pain/N/V at this time; no need for surgical intervention -Cipro and Flagyl started per GS continue for total of 1 week -Serial abdominal  exams per GS with outpatient follow-up  Multiple sclerosis -Multiple falls which may be related -PT evaluation recommending SNF which patient is agreeable to -Continue home pain medications  Hypertension: Controlled. -Propranolol  Hypokalemia/Hypomagnesemia -resolved.  DVT prophylaxis:SCDs Code Status:Full Family Communication:Patient's daughter at the bedside. Disposition Plan: Status is: Inpatient  Remains inpatient appropriate because:IV treatments appropriate due to intensity of illness or inability to take PO and Inpatient level of care appropriate due to severity of illness   Dispo: The patient is from:Home Anticipated d/c is to:SNF Anticipated d/c date is:1 to 2 days  Patient currently is medically stable to d/c. SNF placement pending.  Patient was supposed to be discharged to [facility] on Thursday however their admission coordinator did not return our TOC's calls until late Thursday and agreed to take patient on Friday however on Friday again, they notified us that they are not in patient's Medicaid in network plan.  Patient is now waiting for arrangements to be done at another skilled nursing facility.  Patient is medically stable for discharge.  Consultants:  General surgery  Cardiology for TEE  Discussed with ID 1/17  Procedures:  See below   Nutritional Assessment:  The patient's BMI is: Body mass index is 16.65 kg/m.Marland Kitchen  Skin Assessment:  I have examined the patient's skin and I agree with the wound assessment as performed by the wound care RN as outlined below:  Pressure Injury 03/13/20 Sacrum Medial Deep Tissue Pressure Injury - Purple or maroon localized area of discolored intact skin or blood-filled blister due to damage of underlying soft tissue from pressure and/or shear. 6.5 cm x 5.5 cm (Active)  03/13/20 1800  Location: Sacrum  Location Orientation: Medial  Staging:  Deep  Tissue Pressure Injury - Purple or maroon localized area of discolored intact skin or blood-filled blister due to damage of underlying soft tissue from pressure and/or shear.  Wound Description (Comments): 6.5 cm x 5.5 cm  Present on Admission: Yes     Pressure Injury 03/13/20 Heel Left;Medial Stage 1 -  Intact skin with non-blanchable redness of a localized area usually over a bony prominence. 3 cm x 2.5 cm (Active)  03/13/20 1800  Location: Heel  Location Orientation: Left;Medial  Staging: Stage 1 -  Intact skin with non-blanchable redness of a localized area usually over a bony prominence.  Wound Description (Comments): 3 cm x 2.5 cm  Present on Admission: Yes    Antimicrobials:  Anti-infectives (From admission, onward)   Start     Dose/Rate Route Frequency Ordered Stop   03/22/20 0900  vancomycin (VANCOREADY) IVPB 750 mg/150 mL        750 mg 150 mL/hr over 60 Minutes Intravenous Every 12 hours 03/22/20 0806     03/20/20 0000  vancomycin IVPB  Status:  Discontinued        1,500 mg Intravenous Every 24 hours 03/20/20 1522 03/22/20    03/20/20 0000  ciprofloxacin (CIPRO) 500 MG tablet        500 mg Oral 2 times daily 03/20/20 1522 03/23/20 2359   03/20/20 0000  metroNIDAZOLE (FLAGYL) 500 MG tablet        500 mg Oral Every 8 hours 03/20/20 1522 03/23/20 2359   03/19/20 1400  metroNIDAZOLE (FLAGYL) tablet 500 mg  Status:  Discontinued        500 mg Oral Every 8 hours 03/19/20 1314 03/19/20 1316   03/19/20 1400  metroNIDAZOLE (FLAGYL) tablet 500 mg        500 mg Oral Every 8 hours 03/19/20 1316 03/22/20 0559   03/19/20 1330  ciprofloxacin (CIPRO) tablet 500 mg  Status:  Discontinued        500 mg Oral 2 times daily 03/19/20 1314 03/19/20 1316   03/19/20 1330  ciprofloxacin (CIPRO) tablet 500 mg        500 mg Oral 2 times daily 03/19/20 1316 03/21/20 2233   03/18/20 1730  vancomycin (VANCOREADY) IVPB 1500 mg/300 mL  Status:  Discontinued        1,500 mg 150 mL/hr over 120 Minutes  Intravenous Every 24 hours 03/18/20 1658 03/22/20 0806   03/15/20 1300  ciprofloxacin (CIPRO) IVPB 400 mg  Status:  Discontinued        400 mg 200 mL/hr over 60 Minutes Intravenous Every 12 hours 03/15/20 1212 03/19/20 1314   03/15/20 1300  metroNIDAZOLE (FLAGYL) IVPB 500 mg  Status:  Discontinued        500 mg 100 mL/hr over 60 Minutes Intravenous Every 8 hours 03/15/20 1212 03/19/20 1314   03/14/20 1445  vancomycin (VANCOREADY) IVPB 750 mg/150 mL  Status:  Discontinued        750 mg 150 mL/hr over 60 Minutes Intravenous Every 24 hours 03/14/20 1358 03/18/20 1658   03/14/20 1200  vancomycin (VANCOREADY) IVPB 750 mg/150 mL  Status:  Discontinued        750 mg 150 mL/hr over 60 Minutes Intravenous Every 24 hours 03/13/20 1253 03/13/20 1803   03/13/20 2300  cefTRIAXone (ROCEPHIN) 1 g in sodium chloride 0.9 % 100 mL IVPB  Status:  Discontinued        1 g 200 mL/hr over 30 Minutes Intravenous Every 24 hours 03/13/20 1803 03/14/20 1516  03/13/20 1145  ceFEPIme (MAXIPIME) 2 g in sodium chloride 0.9 % 100 mL IVPB        2 g 200 mL/hr over 30 Minutes Intravenous  Once 03/13/20 1130 03/13/20 1313   03/13/20 1145  metroNIDAZOLE (FLAGYL) IVPB 500 mg        500 mg 100 mL/hr over 60 Minutes Intravenous  Once 03/13/20 1130 03/13/20 1311   03/13/20 1115  levofloxacin (LEVAQUIN) IVPB 750 mg  Status:  Discontinued        750 mg 100 mL/hr over 90 Minutes Intravenous  Once 03/13/20 1109 03/13/20 1127   03/13/20 1115  aztreonam (AZACTAM) 2 g in sodium chloride 0.9 % 100 mL IVPB  Status:  Discontinued        2 g 200 mL/hr over 30 Minutes Intravenous  Once 03/13/20 1109 03/13/20 1132   03/13/20 1115  vancomycin (VANCOCIN) IVPB 1000 mg/200 mL premix        1,000 mg 200 mL/hr over 60 Minutes Intravenous  Once 03/13/20 1109 03/13/20 1307      Subjective: Patient seen and examined.  She has no complaints.  Objective: Vitals:   03/21/20 0925 03/21/20 2130 03/22/20 0513 03/22/20 0935  BP: 121/84 130/76  134/81 129/80  Pulse: 79 70 68 73  Resp:  18 18   Temp:  98 F (36.7 C) 98.3 F (36.8 C)   TempSrc:  Oral Oral   SpO2:  96% 96%   Weight:      Height:        Intake/Output Summary (Last 24 hours) at 03/22/2020 1040 Last data filed at 03/22/2020 0721 Gross per 24 hour  Intake 240 ml  Output 2553 ml  Net -2313 ml   Filed Weights   03/13/20 1000 03/13/20 1755  Weight: 45.8 kg 44 kg    Examination:  General exam: Appears calm and comfortable  Respiratory system: Clear to auscultation. Respiratory effort normal. Cardiovascular system: S1 & S2 heard, RRR. No JVD, murmurs, rubs, gallops or clicks. No pedal edema. Gastrointestinal system: Abdomen is nondistended, soft and nontender. No organomegaly or masses felt. Normal bowel sounds heard. Central nervous system: Alert and oriented. No focal neurological deficits. Extremities: Symmetric 5 x 5 power.   Data Reviewed: I have personally reviewed following labs and imaging studies  CBC: Recent Labs  Lab 03/16/20 0500 03/17/20 0500 03/19/20 0509  WBC 15.1* 15.3* 10.1  HGB 11.1* 12.1 11.5*  HCT 32.5* 35.1* 33.6*  MCV 96.7 98.0 98.5  PLT 288 333 [id number]   Basic Metabolic Panel: Recent Labs  Lab 03/16/20 0500 03/17/20 0500 03/19/20 0509 03/20/20 0522  NA 134* 134* 136 135  K 2.9* 4.4 3.2* 4.1  CL 104 102 102 103  CO2 23 22 26 25   GLUCOSE 98 86 100* 90  BUN 6* 5* 9 7*  CREATININE 0.34* 0.36* 0.39* 0.51  CALCIUM 7.5* 7.7* 7.9* 7.9*  MG 1.6* 1.8 1.5* 1.7   GFR: Estimated Creatinine Clearance: 49.3 mL/min (by C-G formula based on SCr of 0.51 mg/dL). Liver Function Tests: No results for input(s): AST, ALT, ALKPHOS, BILITOT, PROT, ALBUMIN in the last 168 hours. No results for input(s): LIPASE, AMYLASE in the last 168 hours. No results for input(s): AMMONIA in the last 168 hours. Coagulation Profile: No results for input(s): INR, PROTIME in the last 168 hours. Cardiac Enzymes: No results for input(s): CKTOTAL, CKMB,  CKMBINDEX, TROPONINI in the last 168 hours. BNP (last 3 results) No results for input(s): PROBNP in the last 8760 hours. HbA1C:  No results for input(s): HGBA1C in the last 72 hours. CBG: No results for input(s): GLUCAP in the last 168 hours. Lipid Profile: No results for input(s): CHOL, HDL, LDLCALC, TRIG, CHOLHDL, LDLDIRECT in the last 72 hours. Thyroid Function Tests: No results for input(s): TSH, T4TOTAL, FREET4, T3FREE, THYROIDAB in the last 72 hours. Anemia Panel: No results for input(s): VITAMINB12, FOLATE, FERRITIN, TIBC, IRON, RETICCTPCT in the last 72 hours. Sepsis Labs: No results for input(s): PROCALCITON, LATICACIDVEN in the last 168 hours.  Recent Results (from the past 240 hour(s))  Urine culture     Status: Abnormal   Collection Time: 03/13/20 10:25 AM   Specimen: In/Out Cath Urine  Result Value Ref Range Status   Specimen Description   Final    IN/OUT CATH URINE Performed at [facility], [address] [place], [place] [address]    Special Requests   Final    NONE Performed at [facility], [address] [place], [place] [address]    Culture MULTIPLE SPECIES PRESENT, SUGGEST RECOLLECTION (A)  Final   Report Status 03/15/2020 FINAL  Final  SARS CORONAVIRUS 2 (TAT 6-24 HRS) Nasopharyngeal Nasopharyngeal Swab     Status: None   Collection Time: 03/13/20 11:07 AM   Specimen: Nasopharyngeal Swab  Result Value Ref Range Status   SARS Coronavirus 2 NEGATIVE NEGATIVE Final    Comment: (NOTE) SARS-CoV-2 target nucleic acids are NOT DETECTED.  The SARS-CoV-2 RNA is generally detectable in upper and lower respiratory specimens during the acute phase of infection. Negative results do not preclude SARS-CoV-2 infection, do not rule out co-infections with other pathogens, and should not be used as the sole basis for treatment or other patient management decisions. Negative results must be combined with clinical observations, patient history, and epidemiological  information. The expected result is Negative.  Fact Sheet for Patients: SugarRoll.be  Fact Sheet for Healthcare Providers: https://www.woods-mathews.com/  This test is not yet approved or cleared by the [place] FDA and  has been authorized for detection and/or diagnosis of SARS-CoV-2 by FDA under an Emergency Use Authorization (EUA). This EUA will remain  in effect (meaning this test can be used) for the duration of the COVID-19 declaration under Se ction 564(b)(1) of the Act, 21 U.S.C. section 360bbb-3(b)(1), unless the authorization is terminated or revoked sooner.  Performed at [facility], [place] [address], [place], [place] [address]   Blood culture (routine single)     Status: Abnormal   Collection Time: 03/13/20 11:22 AM   Specimen: BLOOD  Result Value Ref Range Status   Specimen Description   Final    BLOOD RIGHT ANTECUBITAL Performed at [facility], [address] [place], [place] [address]    Special Requests   Final    Blood Culture results may not be optimal due to an excessive volume of blood received in culture bottles BOTTLES DRAWN AEROBIC AND ANAEROBIC Performed at [facility], [address] [place], [place] [address]    Culture  Setup Time   Final    GRAM POSITIVE COCCI IN CLUSTERS AEROBIC BOTTLE ONLY Organism ID to follow CRITICAL RESULT CALLED TO, READ BACK BY AND VERIFIED WITHGloris Manchester [id number] 03/14/20 A BROWNING Performed at [facility], [place] [address] [place], [place] [address]    Culture METHICILLIN RESISTANT STAPHYLOCOCCUS AUREUS (A)  Final   Report Status 03/16/2020 FINAL  Final   Organism ID, Bacteria METHICILLIN RESISTANT STAPHYLOCOCCUS AUREUS  Final      Susceptibility  Methicillin resistant staphylococcus aureus - MIC*    CIPROFLOXACIN >=8 RESISTANT Resistant     ERYTHROMYCIN <=0.25 SENSITIVE Sensitive     GENTAMICIN <=0.5 SENSITIVE Sensitive     OXACILLIN >=4 RESISTANT  Resistant     TETRACYCLINE <=1 SENSITIVE Sensitive     VANCOMYCIN 1 SENSITIVE Sensitive     TRIMETH/SULFA <=10 SENSITIVE Sensitive     CLINDAMYCIN <=0.25 SENSITIVE Sensitive     RIFAMPIN <=0.5 SENSITIVE Sensitive     Inducible Clindamycin NEGATIVE Sensitive     * METHICILLIN RESISTANT STAPHYLOCOCCUS AUREUS  Blood Culture ID Panel (Reflexed)     Status: Abnormal   Collection Time: 03/13/20 11:22 AM  Result Value Ref Range Status   Enterococcus faecalis NOT DETECTED NOT DETECTED Final   Enterococcus Faecium NOT DETECTED NOT DETECTED Final   Listeria monocytogenes NOT DETECTED NOT DETECTED Final   Staphylococcus species DETECTED (A) NOT DETECTED Final    Comment: CRITICAL RESULT CALLED TO, READ BACK BY AND VERIFIED WITHGloris Manchester PHARMD 1425 03/14/20 A BROWNING    Staphylococcus aureus (BCID) DETECTED (A) NOT DETECTED Final    Comment: Methicillin (oxacillin)-resistant Staphylococcus aureus (MRSA). MRSA is predictably resistant to beta-lactam antibiotics (except ceftaroline). Preferred therapy is vancomycin unless clinically contraindicated. Patient requires contact precautions if  hospitalized. CRITICAL RESULT CALLED TO, READ BACK BY AND VERIFIED WITH: Gloris Manchester PHARMD 1425 03/14/20 A BROWNING    Staphylococcus epidermidis NOT DETECTED NOT DETECTED Final   Staphylococcus lugdunensis NOT DETECTED NOT DETECTED Final   Streptococcus species NOT DETECTED NOT DETECTED Final   Streptococcus agalactiae NOT DETECTED NOT DETECTED Final   Streptococcus pneumoniae NOT DETECTED NOT DETECTED Final   Streptococcus pyogenes NOT DETECTED NOT DETECTED Final   A.calcoaceticus-baumannii NOT DETECTED NOT DETECTED Final   Bacteroides fragilis NOT DETECTED NOT DETECTED Final   Enterobacterales NOT DETECTED NOT DETECTED Final   Enterobacter cloacae complex NOT DETECTED NOT DETECTED Final   Escherichia coli NOT DETECTED NOT DETECTED Final   Klebsiella aerogenes NOT DETECTED NOT DETECTED Final   Klebsiella  oxytoca NOT DETECTED NOT DETECTED Final   Klebsiella pneumoniae NOT DETECTED NOT DETECTED Final   Proteus species NOT DETECTED NOT DETECTED Final   Salmonella species NOT DETECTED NOT DETECTED Final   Serratia marcescens NOT DETECTED NOT DETECTED Final   Haemophilus influenzae NOT DETECTED NOT DETECTED Final   Neisseria meningitidis NOT DETECTED NOT DETECTED Final   Pseudomonas aeruginosa NOT DETECTED NOT DETECTED Final   Stenotrophomonas maltophilia NOT DETECTED NOT DETECTED Final   Candida albicans NOT DETECTED NOT DETECTED Final   Candida auris NOT DETECTED NOT DETECTED Final   Candida glabrata NOT DETECTED NOT DETECTED Final   Candida krusei NOT DETECTED NOT DETECTED Final   Candida parapsilosis NOT DETECTED NOT DETECTED Final   Candida tropicalis NOT DETECTED NOT DETECTED Final   Cryptococcus neoformans/gattii NOT DETECTED NOT DETECTED Final   Meth resistant mecA/C and MREJ DETECTED (A) NOT DETECTED Final    Comment: CRITICAL RESULT CALLED TO, READ BACK BY AND VERIFIED WITHGloris Manchester PHARMD 1425 03/14/20 A BROWNING Performed at [facility], [address] [place], [place] [address]   Resp Panel by RT-PCR (Flu A&B, Covid) Nasopharyngeal Swab     Status: None   Collection Time: 03/13/20  2:24 PM   Specimen: Nasopharyngeal Swab; Nasopharyngeal(NP) swabs in vial transport medium  Result Value Ref Range Status   SARS Coronavirus 2 by RT PCR NEGATIVE NEGATIVE Final    Comment: (NOTE) SARS-CoV-2 target nucleic acids are NOT  DETECTED.  The SARS-CoV-2 RNA is generally detectable in upper respiratory specimens during the acute phase of infection. The lowest concentration of SARS-CoV-2 viral copies this assay can detect is 138 copies/mL. A negative result does not preclude SARS-Cov-2 infection and should not be used as the sole basis for treatment or other patient management decisions. A negative result may occur with  improper specimen collection/handling, submission of specimen  other than nasopharyngeal swab, presence of viral mutation(s) within the areas targeted by this assay, and inadequate number of viral copies(<138 copies/mL). A negative result must be combined with clinical observations, patient history, and epidemiological information. The expected result is Negative.  Fact Sheet for Patients:  BloggerCourse.com  Fact Sheet for Healthcare Providers:  SeriousBroker.it  This test is no t yet approved or cleared by the [place] FDA and  has been authorized for detection and/or diagnosis of SARS-CoV-2 by FDA under an Emergency Use Authorization (EUA). This EUA will remain  in effect (meaning this test can be used) for the duration of the COVID-19 declaration under Section 564(b)(1) of the Act, 21 U.S.C.section 360bbb-3(b)(1), unless the authorization is terminated  or revoked sooner.       Influenza A by PCR NEGATIVE NEGATIVE Final   Influenza B by PCR NEGATIVE NEGATIVE Final    Comment: (NOTE) The Xpert Xpress SARS-CoV-2/FLU/RSV plus assay is intended as an aid in the diagnosis of influenza from Nasopharyngeal swab specimens and should not be used as a sole basis for treatment. Nasal washings and aspirates are unacceptable for Xpert Xpress SARS-CoV-2/FLU/RSV testing.  Fact Sheet for Patients: BloggerCourse.com  Fact Sheet for Healthcare Providers: SeriousBroker.it  This test is not yet approved or cleared by the [place] FDA and has been authorized for detection and/or diagnosis of SARS-CoV-2 by FDA under an Emergency Use Authorization (EUA). This EUA will remain in effect (meaning this test can be used) for the duration of the COVID-19 declaration under Section 564(b)(1) of the Act, 21 U.S.C. section 360bbb-3(b)(1), unless the authorization is terminated or revoked.  Performed at [facility], [address], [place], [place]  [address]   MRSA PCR Screening     Status: None   Collection Time: 03/14/20  2:01 PM   Specimen: Nasal Mucosa; Nasopharyngeal  Result Value Ref Range Status   MRSA by PCR NEGATIVE NEGATIVE Final    Comment:        The GeneXpert MRSA Assay (FDA approved for NASAL specimens only), is one component of a comprehensive MRSA colonization surveillance program. It is not intended to diagnose MRSA infection nor to guide or monitor treatment for MRSA infections. Performed at [facility], [address], [place], [place] [address]   Culture, blood (routine x 2)     Status: None   Collection Time: 03/14/20  2:05 PM   Specimen: BLOOD  Result Value Ref Range Status   Specimen Description BLOOD LEFT ANTECUBITAL  Final   Special Requests   Final    Blood Culture adequate volume BOTTLES DRAWN AEROBIC AND ANAEROBIC   Culture   Final    NO GROWTH 5 DAYS Performed at [facility], [address], [place], [place] [address]    Report Status 03/19/2020 FINAL  Final  Culture, blood (routine x 2)     Status: None   Collection Time: 03/14/20  2:09 PM   Specimen: BLOOD  Result Value Ref Range Status   Specimen Description BLOOD BLOOD LEFT HAND  Final   Special Requests   Final  Blood Culture adequate volume BOTTLES DRAWN AEROBIC AND ANAEROBIC   Culture   Final    NO GROWTH 5 DAYS Performed at [facility], [address] [place], [place] [address]    Report Status 03/19/2020 FINAL  Final    Radiology Studies: No results found.  Scheduled Meds: . atorvastatin  20 mg Oral Daily  . baclofen  10 mg Oral TID  . celecoxib  200 mg Oral BID  . Chlorhexidine Gluconate Cloth  6 each Topical Daily  . collagenase   Topical Daily  . gabapentin  600 mg Oral TID  . propranolol  20 mg Oral BID   Continuous Infusions: . vancomycin 750 mg (03/22/20 0935)     LOS: 9 days   Time spent: 28 minutes  Darliss Cheney, MD How to contact the [facility] Attending or Consulting provider [place] or covering provider  during after hours [place], for this patient?  1. Check the care team in [facility] and look for a) attending/consulting [facility] provider listed and b) the [facility] team listed 2. Log into [email or web address] and use [facility]'s universal password to access. If you do not have the password, please contact the hospital operator. 3. Locate the [facility] provider you are looking for under [facility] and page to a number that you can be directly reached. 4. If you still have difficulty reaching the provider, please page the [facility] (Director on Call) for the Hospitalists listed on amion for assistance.  03/22/2020, 10:40 AM

## 2020-03-23 DIAGNOSIS — L89152 Pressure ulcer of sacral region, stage 2: Secondary | ICD-10-CM | POA: Diagnosis not present

## 2020-03-23 LAB — BASIC METABOLIC PANEL
Anion gap: 5 (ref 5–15)
BUN: 9 mg/dL (ref 8–23)
CO2: 27 mmol/L (ref 22–32)
Calcium: 8.2 mg/dL — ABNORMAL LOW (ref 8.9–10.3)
Chloride: 104 mmol/L (ref 98–111)
Creatinine, Ser: 0.47 mg/dL (ref 0.44–1.00)
GFR, Estimated: >60 mL/min (ref >60)
Glucose, Bld: 94 mg/dL (ref 70–99)
Potassium: 3.9 mmol/L (ref 3.5–5.1)
Sodium: 136 mmol/L (ref 135–145)

## 2020-03-23 LAB — MAGNESIUM: Magnesium: 1.6 mg/dL — ABNORMAL LOW (ref 1.7–2.4)

## 2020-03-23 MED ORDER — ENOXAPARIN SODIUM 40 MG/0.4ML ~~LOC~~ SOLN: 40.0000 mg | SUBCUTANEOUS | Status: DC

## 2020-03-23 MED ORDER — ENOXAPARIN SODIUM 30 MG/0.3ML ~~LOC~~ SOLN
30.0000 mg | SUBCUTANEOUS | Status: DC
  Administered 2020-03-23 – 2020-03-30 (×8): 30 mg via SUBCUTANEOUS
  Filled 2020-03-23 (×9): qty 0.3

## 2020-03-23 NOTE — TOC Progression Note (Signed)
Transition of Care [facility]) - Progression Note    Patient Details  Name: Faith [name] MRN: [id number] Date of Birth: 1956-01-11  Transition of Care [facility]) CM/SW Contact  Natasha Bence, LCSW Phone Number: 03/23/2020, 3:48 PM  Clinical Narrative:    CSW placed referral for additional SNF's in [place]. TOC to follow.    Expected Discharge Plan: [place] Barriers to Discharge: Continued Medical Work up  Expected Discharge Plan and Services Expected Discharge Plan: [place] In-house Referral: Clinical Social Work   Post Acute Care Choice: [place] Living arrangements for the past 2 months: Single Family Home Expected Discharge Date: 03/21/20               DME Arranged: N/A                     Social Determinants of Health (SDOH) Interventions    Readmission Risk Interventions Readmission Risk Prevention Plan 03/19/2020  Medication Screening Complete  Transportation Screening Complete  Some recent data might be hidden

## 2020-03-23 NOTE — Progress Notes (Signed)
PROGRESS NOTE    Faith [name]  [id number] DOB: [birth year] DOA: 03/13/2020 PCP: Doree Albee, MD   Brief Narrative:  Faith [name] a 65 y.o.femalewith medical history significant formultiple sclerosis, COPD, pressure ulcer, hypertension. Patient was brought to the ED via EMS with complaints of fall, and family not being able to take care of her.Patient was admitted with SIRS criteria and decubitus ulcer with concern for need for debridement. She has had some worsening weakness and falls. She is now noted to have MRSA bacteremia.She hasbeen started on ciprofloxacin and Flagyl for empiric treatment of appendicitisper GS.  Patient has undergone transesophageal echocardiogram on 1/17 with no findings of vegetations.  ID recommends 4 weeks of IV antibiotics.  PICC line placed 1/17. SNF placement pending.  Assessment & Plan:   Principal Problem:   Decubital ulcer Active Problems:   Falls frequently   Multiple sclerosis (HCC)   SIRS (systemic inflammatory response syndrome) (HCC)   Sacral decubitus ulcer, stage II (HCC)   Acute appendicitis   Bacteremia  MRSA bacteremia -Decubitus ulcer noted, but no other obvious source -Continue vancomycin for total 4 weeks per ID; OPAT placed 1/18 -Repeat blood culturesnegative -2D echocardiogram TTE performed without any signs of vegetations. LVEF 60 to 65% -TEE performed on 1/17 with no vegetations -No new back pain -PICC line placed to RUE on 1/17.  Plan to continue IV antibiotics through 04/10/2020.  Right buttock decubitus ulcer -Appears to be mostly stage II with no need for debridement per general surgery -Continuediet -IV morphine as needed for pain control -Continue wound care protocols -Air Mattress recommended  Questionable early appendicitis -Without significant abdominal pain/N/V at this time; no need for surgical intervention -Cipro and Flagyl started per GS continue for total of 1 week, completed course on  03/22/2020.  Multiple sclerosis -Multiple falls which may be related -PT evaluation recommending SNF which patient is agreeable to -Continue home pain medications  Hypertension: Controlled. -Propranolol  Hypokalemia/Hypomagnesemia -resolved.  Pending labs today.  DVT prophylaxis:SCDs Code Status:Full Family Communication:None present. Disposition Plan: Status is: Inpatient  Remains inpatient appropriate because:IV treatments appropriate due to intensity of illness or inability to take PO and Inpatient level of care appropriate due to severity of illness   Dispo: The patient is from:Home Anticipated d/c is to:SNF Anticipated d/c date is:1 to 2 days  Patient currently is medically stable to d/c. SNF placement pending.  Patient was supposed to be discharged to [facility] home on Thursday however their admission coordinator did not return our TOC's calls until late Thursday and agreed to take patient on Friday however on Friday again, they notified us that they are not in patient's Medicaid in network plan.  Patient is now waiting for arrangements to be done at another skilled nursing facility.  Patient is medically stable for discharge.  Consultants:  General surgery  Cardiology for TEE  Discussed with ID 1/17  Procedures:  See below   Nutritional Assessment:  The patient's BMI is: Body mass index is 16.65 kg/m.Marland Kitchen  Skin Assessment:  I have examined the patient's skin and I agree with the wound assessment as performed by the wound care RN as outlined below:  Pressure Injury 03/13/20 Sacrum Medial Deep Tissue Pressure Injury - Purple or maroon localized area of discolored intact skin or blood-filled blister due to damage of underlying soft tissue from pressure and/or shear. 6.5 cm x 5.5 cm (Active)  03/13/20 1800  Location: Sacrum  Location Orientation: Medial  Staging: Deep Tissue Pressure  Injury -  Purple or maroon localized area of discolored intact skin or blood-filled blister due to damage of underlying soft tissue from pressure and/or shear.  Wound Description (Comments): 6.5 cm x 5.5 cm  Present on Admission: Yes     Pressure Injury 03/13/20 Heel Left;Medial Stage 1 -  Intact skin with non-blanchable redness of a localized area usually over a bony prominence. 3 cm x 2.5 cm (Active)  03/13/20 1800  Location: Heel  Location Orientation: Left;Medial  Staging: Stage 1 -  Intact skin with non-blanchable redness of a localized area usually over a bony prominence.  Wound Description (Comments): 3 cm x 2.5 cm  Present on Admission: Yes    Antimicrobials:  Anti-infectives (From admission, onward)   Start     Dose/Rate Route Frequency Ordered Stop   03/22/20 0900  vancomycin (VANCOREADY) IVPB 750 mg/150 mL        750 mg 150 mL/hr over 60 Minutes Intravenous Every 12 hours 03/22/20 0806     03/20/20 0000  vancomycin IVPB  Status:  Discontinued        1,500 mg Intravenous Every 24 hours 03/20/20 1522 03/22/20    03/20/20 0000  ciprofloxacin (CIPRO) 500 MG tablet        500 mg Oral 2 times daily 03/20/20 1522 03/23/20 2359   03/20/20 0000  metroNIDAZOLE (FLAGYL) 500 MG tablet        500 mg Oral Every 8 hours 03/20/20 1522 03/23/20 2359   03/19/20 1400  metroNIDAZOLE (FLAGYL) tablet 500 mg  Status:  Discontinued        500 mg Oral Every 8 hours 03/19/20 1314 03/19/20 1316   03/19/20 1400  metroNIDAZOLE (FLAGYL) tablet 500 mg        500 mg Oral Every 8 hours 03/19/20 1316 03/22/20 0559   03/19/20 1330  ciprofloxacin (CIPRO) tablet 500 mg  Status:  Discontinued        500 mg Oral 2 times daily 03/19/20 1314 03/19/20 1316   03/19/20 1330  ciprofloxacin (CIPRO) tablet 500 mg        500 mg Oral 2 times daily 03/19/20 1316 03/21/20 2233   03/18/20 1730  vancomycin (VANCOREADY) IVPB 1500 mg/300 mL  Status:  Discontinued        1,500 mg 150 mL/hr over 120 Minutes Intravenous Every 24 hours  03/18/20 1658 03/22/20 0806   03/15/20 1300  ciprofloxacin (CIPRO) IVPB 400 mg  Status:  Discontinued        400 mg 200 mL/hr over 60 Minutes Intravenous Every 12 hours 03/15/20 1212 03/19/20 1314   03/15/20 1300  metroNIDAZOLE (FLAGYL) IVPB 500 mg  Status:  Discontinued        500 mg 100 mL/hr over 60 Minutes Intravenous Every 8 hours 03/15/20 1212 03/19/20 1314   03/14/20 1445  vancomycin (VANCOREADY) IVPB 750 mg/150 mL  Status:  Discontinued        750 mg 150 mL/hr over 60 Minutes Intravenous Every 24 hours 03/14/20 1358 03/18/20 1658   03/14/20 1200  vancomycin (VANCOREADY) IVPB 750 mg/150 mL  Status:  Discontinued        750 mg 150 mL/hr over 60 Minutes Intravenous Every 24 hours 03/13/20 1253 03/13/20 1803   03/13/20 2300  cefTRIAXone (ROCEPHIN) 1 g in sodium chloride 0.9 % 100 mL IVPB  Status:  Discontinued        1 g 200 mL/hr over 30 Minutes Intravenous Every 24 hours 03/13/20 1803 03/14/20 1516   03/13/20 1145  ceFEPIme (MAXIPIME) 2 g in sodium chloride 0.9 % 100 mL IVPB        2 g 200 mL/hr over 30 Minutes Intravenous  Once 03/13/20 1130 03/13/20 1313   03/13/20 1145  metroNIDAZOLE (FLAGYL) IVPB 500 mg        500 mg 100 mL/hr over 60 Minutes Intravenous  Once 03/13/20 1130 03/13/20 1311   03/13/20 1115  levofloxacin (LEVAQUIN) IVPB 750 mg  Status:  Discontinued        750 mg 100 mL/hr over 90 Minutes Intravenous  Once 03/13/20 1109 03/13/20 1127   03/13/20 1115  aztreonam (AZACTAM) 2 g in sodium chloride 0.9 % 100 mL IVPB  Status:  Discontinued        2 g 200 mL/hr over 30 Minutes Intravenous  Once 03/13/20 1109 03/13/20 1132   03/13/20 1115  vancomycin (VANCOCIN) IVPB 1000 mg/200 mL premix        1,000 mg 200 mL/hr over 60 Minutes Intravenous  Once 03/13/20 1109 03/13/20 1307      Subjective: Patient seen and examined.  Complains of back pain and feeling tired.  No other complaint.  Alert and oriented.  Objective: Vitals:   03/22/20 0935 03/22/20 1418 03/22/20 2116  03/23/20 0517  BP: 129/80 132/83 117/75 109/68  Pulse: 73 73 78 75  Resp:   18 18  Temp:  98.4 F (36.9 C) 98 F (36.7 C) 98.8 F (37.1 C)  TempSrc:  Oral Oral Oral  SpO2:  96% 95% 95%  Weight:      Height:        Intake/Output Summary (Last 24 hours) at 03/23/2020 1013 Last data filed at 03/23/2020 0900 Gross per 24 hour  Intake 1020 ml  Output 1700 ml  Net -680 ml   Filed Weights   03/13/20 1000 03/13/20 1755  Weight: 45.8 kg 44 kg    Examination:  General exam: Appears calm and comfortable  Respiratory system: Clear to auscultation. Respiratory effort normal. Cardiovascular system: S1 & S2 heard, RRR. No JVD, murmurs, rubs, gallops or clicks. No pedal edema. Gastrointestinal system: Abdomen is nondistended, soft and nontender. No organomegaly or masses felt. Normal bowel sounds heard. Central nervous system: Alert and oriented. No focal neurological deficits. Extremities: Symmetric 5 x 5 power. Skin: No rashes, lesions or ulcers.  Psychiatry: Judgement and insight appear normal. Mood & affect appropriate.   Data Reviewed: I have personally reviewed following labs and imaging studies  CBC: Recent Labs  Lab 03/17/20 0500 03/19/20 0509  WBC 15.3* 10.1  HGB 12.1 11.5*  HCT 35.1* 33.6*  MCV 98.0 98.5  PLT 333 [id number]   Basic Metabolic Panel: Recent Labs  Lab 03/17/20 0500 03/19/20 0509 03/20/20 0522  NA 134* 136 135  K 4.4 3.2* 4.1  CL 102 102 103  CO2 22 26 25   GLUCOSE 86 100* 90  BUN 5* 9 7*  CREATININE 0.36* 0.39* 0.51  CALCIUM 7.7* 7.9* 7.9*  MG 1.8 1.5* 1.7   GFR: Estimated Creatinine Clearance: 49.3 mL/min (by C-G formula based on SCr of 0.51 mg/dL). Liver Function Tests: No results for input(s): AST, ALT, ALKPHOS, BILITOT, PROT, ALBUMIN in the last 168 hours. No results for input(s): LIPASE, AMYLASE in the last 168 hours. No results for input(s): AMMONIA in the last 168 hours. Coagulation Profile: No results for input(s): INR, PROTIME in the  last 168 hours. Cardiac Enzymes: No results for input(s): CKTOTAL, CKMB, CKMBINDEX, TROPONINI in the last 168 hours. BNP (last 3 results) No  results for input(s): PROBNP in the last 8760 hours. HbA1C: No results for input(s): HGBA1C in the last 72 hours. CBG: No results for input(s): GLUCAP in the last 168 hours. Lipid Profile: No results for input(s): CHOL, HDL, LDLCALC, TRIG, CHOLHDL, LDLDIRECT in the last 72 hours. Thyroid Function Tests: No results for input(s): TSH, T4TOTAL, FREET4, T3FREE, THYROIDAB in the last 72 hours. Anemia Panel: No results for input(s): VITAMINB12, FOLATE, FERRITIN, TIBC, IRON, RETICCTPCT in the last 72 hours. Sepsis Labs: No results for input(s): PROCALCITON, LATICACIDVEN in the last 168 hours.  Recent Results (from the past 240 hour(s))  Urine culture     Status: Abnormal   Collection Time: 03/13/20 10:25 AM   Specimen: In/Out Cath Urine  Result Value Ref Range Status   Specimen Description   Final    IN/OUT CATH URINE Performed at [facility], [address] [place], [place] [address]    Special Requests   Final    NONE Performed at [facility], [address], [place], [place] [address]    Culture MULTIPLE SPECIES PRESENT, SUGGEST RECOLLECTION (A)  Final   Report Status 03/15/2020 FINAL  Final  SARS CORONAVIRUS 2 (TAT 6-24 HRS) Nasopharyngeal Nasopharyngeal Swab     Status: None   Collection Time: 03/13/20 11:07 AM   Specimen: Nasopharyngeal Swab  Result Value Ref Range Status   SARS Coronavirus 2 NEGATIVE NEGATIVE Final    Comment: (NOTE) SARS-CoV-2 target nucleic acids are NOT DETECTED.  The SARS-CoV-2 RNA is generally detectable in upper and lower respiratory specimens during the acute phase of infection. Negative results do not preclude SARS-CoV-2 infection, do not rule out co-infections with other pathogens, and should not be used as the sole basis for treatment or other patient management decisions. Negative results must be  combined with clinical observations, patient history, and epidemiological information. The expected result is Negative.  Fact Sheet for Patients: SugarRoll.be  Fact Sheet for Healthcare Providers: https://www.woods-mathews.com/  This test is not yet approved or cleared by the [place] FDA and  has been authorized for detection and/or diagnosis of SARS-CoV-2 by FDA under an Emergency Use Authorization (EUA). This EUA will remain  in effect (meaning this test can be used) for the duration of the COVID-19 declaration under Se ction 564(b)(1) of the Act, 21 U.S.C. section 360bbb-3(b)(1), unless the authorization is terminated or revoked sooner.  Performed at [facility], [place] [address], [place], [place] [address]   Blood culture (routine single)     Status: Abnormal   Collection Time: 03/13/20 11:22 AM   Specimen: BLOOD  Result Value Ref Range Status   Specimen Description   Final    BLOOD RIGHT ANTECUBITAL Performed at [facility], [address] [place], [place] [address]    Special Requests   Final    Blood Culture results may not be optimal due to an excessive volume of blood received in culture bottles BOTTLES DRAWN AEROBIC AND ANAEROBIC Performed at [facility], [address], [place], [place] [address]    Culture  Setup Time   Final    GRAM POSITIVE COCCI IN CLUSTERS AEROBIC BOTTLE ONLY Organism ID to follow CRITICAL RESULT CALLED TO, READ BACK BY AND VERIFIED WITHGloris Manchester [id number] 03/14/20 A BROWNING Performed at [facility], [place] [address] [place], [place] [address]    Culture METHICILLIN RESISTANT STAPHYLOCOCCUS AUREUS (A)  Final   Report Status 03/16/2020 FINAL  Final   Organism ID, Bacteria METHICILLIN RESISTANT STAPHYLOCOCCUS  AUREUS  Final      Susceptibility   Methicillin resistant staphylococcus aureus - MIC*    CIPROFLOXACIN >=8 RESISTANT Resistant     ERYTHROMYCIN <=0.25 SENSITIVE Sensitive      GENTAMICIN <=0.5 SENSITIVE Sensitive     OXACILLIN >=4 RESISTANT Resistant     TETRACYCLINE <=1 SENSITIVE Sensitive     VANCOMYCIN 1 SENSITIVE Sensitive     TRIMETH/SULFA <=10 SENSITIVE Sensitive     CLINDAMYCIN <=0.25 SENSITIVE Sensitive     RIFAMPIN <=0.5 SENSITIVE Sensitive     Inducible Clindamycin NEGATIVE Sensitive     * METHICILLIN RESISTANT STAPHYLOCOCCUS AUREUS  Blood Culture ID Panel (Reflexed)     Status: Abnormal   Collection Time: 03/13/20 11:22 AM  Result Value Ref Range Status   Enterococcus faecalis NOT DETECTED NOT DETECTED Final   Enterococcus Faecium NOT DETECTED NOT DETECTED Final   Listeria monocytogenes NOT DETECTED NOT DETECTED Final   Staphylococcus species DETECTED (A) NOT DETECTED Final    Comment: CRITICAL RESULT CALLED TO, READ BACK BY AND VERIFIED WITHGloris Manchester PHARMD 1425 03/14/20 A BROWNING    Staphylococcus aureus (BCID) DETECTED (A) NOT DETECTED Final    Comment: Methicillin (oxacillin)-resistant Staphylococcus aureus (MRSA). MRSA is predictably resistant to beta-lactam antibiotics (except ceftaroline). Preferred therapy is vancomycin unless clinically contraindicated. Patient requires contact precautions if  hospitalized. CRITICAL RESULT CALLED TO, READ BACK BY AND VERIFIED WITH: Gloris Manchester PHARMD 1425 03/14/20 A BROWNING    Staphylococcus epidermidis NOT DETECTED NOT DETECTED Final   Staphylococcus lugdunensis NOT DETECTED NOT DETECTED Final   Streptococcus species NOT DETECTED NOT DETECTED Final   Streptococcus agalactiae NOT DETECTED NOT DETECTED Final   Streptococcus pneumoniae NOT DETECTED NOT DETECTED Final   Streptococcus pyogenes NOT DETECTED NOT DETECTED Final   A.calcoaceticus-baumannii NOT DETECTED NOT DETECTED Final   Bacteroides fragilis NOT DETECTED NOT DETECTED Final   Enterobacterales NOT DETECTED NOT DETECTED Final   Enterobacter cloacae complex NOT DETECTED NOT DETECTED Final   Escherichia coli NOT DETECTED NOT DETECTED Final    Klebsiella aerogenes NOT DETECTED NOT DETECTED Final   Klebsiella oxytoca NOT DETECTED NOT DETECTED Final   Klebsiella pneumoniae NOT DETECTED NOT DETECTED Final   Proteus species NOT DETECTED NOT DETECTED Final   Salmonella species NOT DETECTED NOT DETECTED Final   Serratia marcescens NOT DETECTED NOT DETECTED Final   Haemophilus influenzae NOT DETECTED NOT DETECTED Final   Neisseria meningitidis NOT DETECTED NOT DETECTED Final   Pseudomonas aeruginosa NOT DETECTED NOT DETECTED Final   Stenotrophomonas maltophilia NOT DETECTED NOT DETECTED Final   Candida albicans NOT DETECTED NOT DETECTED Final   Candida auris NOT DETECTED NOT DETECTED Final   Candida glabrata NOT DETECTED NOT DETECTED Final   Candida krusei NOT DETECTED NOT DETECTED Final   Candida parapsilosis NOT DETECTED NOT DETECTED Final   Candida tropicalis NOT DETECTED NOT DETECTED Final   Cryptococcus neoformans/gattii NOT DETECTED NOT DETECTED Final   Meth resistant mecA/C and MREJ DETECTED (A) NOT DETECTED Final    Comment: CRITICAL RESULT CALLED TO, READ BACK BY AND VERIFIED WITHGloris Manchester PHARMD 1425 03/14/20 A BROWNING Performed at [facility], [address], [place], [place] [address]   Resp Panel by RT-PCR (Flu A&B, Covid) Nasopharyngeal Swab     Status: None   Collection Time: 03/13/20  2:24 PM   Specimen: Nasopharyngeal Swab; Nasopharyngeal(NP) swabs in vial transport medium  Result Value Ref Range Status   SARS Coronavirus 2 by RT PCR NEGATIVE NEGATIVE Final  Comment: (NOTE) SARS-CoV-2 target nucleic acids are NOT DETECTED.  The SARS-CoV-2 RNA is generally detectable in upper respiratory specimens during the acute phase of infection. The lowest concentration of SARS-CoV-2 viral copies this assay can detect is 138 copies/mL. A negative result does not preclude SARS-Cov-2 infection and should not be used as the sole basis for treatment or other patient management decisions. A negative result may occur  with  improper specimen collection/handling, submission of specimen other than nasopharyngeal swab, presence of viral mutation(s) within the areas targeted by this assay, and inadequate number of viral copies(<138 copies/mL). A negative result must be combined with clinical observations, patient history, and epidemiological information. The expected result is Negative.  Fact Sheet for Patients:  BloggerCourse.com  Fact Sheet for Healthcare Providers:  SeriousBroker.it  This test is no t yet approved or cleared by the [place] FDA and  has been authorized for detection and/or diagnosis of SARS-CoV-2 by FDA under an Emergency Use Authorization (EUA). This EUA will remain  in effect (meaning this test can be used) for the duration of the COVID-19 declaration under Section 564(b)(1) of the Act, 21 U.S.C.section 360bbb-3(b)(1), unless the authorization is terminated  or revoked sooner.       Influenza A by PCR NEGATIVE NEGATIVE Final   Influenza B by PCR NEGATIVE NEGATIVE Final    Comment: (NOTE) The Xpert Xpress SARS-CoV-2/FLU/RSV plus assay is intended as an aid in the diagnosis of influenza from Nasopharyngeal swab specimens and should not be used as a sole basis for treatment. Nasal washings and aspirates are unacceptable for Xpert Xpress SARS-CoV-2/FLU/RSV testing.  Fact Sheet for Patients: BloggerCourse.com  Fact Sheet for Healthcare Providers: SeriousBroker.it  This test is not yet approved or cleared by the [place] FDA and has been authorized for detection and/or diagnosis of SARS-CoV-2 by FDA under an Emergency Use Authorization (EUA). This EUA will remain in effect (meaning this test can be used) for the duration of the COVID-19 declaration under Section 564(b)(1) of the Act, 21 U.S.C. section 360bbb-3(b)(1), unless the authorization is terminated  or revoked.  Performed at [facility], [address], [place], [place] [address]   MRSA PCR Screening     Status: None   Collection Time: 03/14/20  2:01 PM   Specimen: Nasal Mucosa; Nasopharyngeal  Result Value Ref Range Status   MRSA by PCR NEGATIVE NEGATIVE Final    Comment:        The GeneXpert MRSA Assay (FDA approved for NASAL specimens only), is one component of a comprehensive MRSA colonization surveillance program. It is not intended to diagnose MRSA infection nor to guide or monitor treatment for MRSA infections. Performed at [facility], [address], [place], [place] [address]   Culture, blood (routine x 2)     Status: None   Collection Time: 03/14/20  2:05 PM   Specimen: BLOOD  Result Value Ref Range Status   Specimen Description BLOOD LEFT ANTECUBITAL  Final   Special Requests   Final    Blood Culture adequate volume BOTTLES DRAWN AEROBIC AND ANAEROBIC   Culture   Final    NO GROWTH 5 DAYS Performed at [facility], [address], [place], [place] [address]    Report Status 03/19/2020 FINAL  Final  Culture, blood (routine x 2)     Status: None   Collection Time: 03/14/20  2:09 PM   Specimen: BLOOD  Result Value Ref Range Status   Specimen Description BLOOD BLOOD LEFT HAND  Final  Special Requests   Final    Blood Culture adequate volume BOTTLES DRAWN AEROBIC AND ANAEROBIC   Culture   Final    NO GROWTH 5 DAYS Performed at [facility], [address] [place], [place] [address]    Report Status 03/19/2020 FINAL  Final    Radiology Studies: No results found.  Scheduled Meds: . atorvastatin  20 mg Oral Daily  . baclofen  10 mg Oral TID  . celecoxib  200 mg Oral BID  . Chlorhexidine Gluconate Cloth  6 each Topical Daily  . collagenase   Topical Daily  . gabapentin  600 mg Oral TID  . propranolol  20 mg Oral BID   Continuous Infusions: . vancomycin 750 mg (03/23/20 0847)     LOS: 10 days   Time spent: 27 minutes  Darliss Cheney, MD How to  contact the [facility] Attending or Consulting provider [place] or covering provider during after hours [place], for this patient?  1. Check the care team in [facility] and look for a) attending/consulting TR[facility] provider listed and b) the [facility] team listed 2. Log into [email or web address] and use [place]'s universal password to access. If you do not have the password, please contact the hospital operator. 3. Locate the [facility] provider you are looking for under [facility] and page to a number that you can be directly reached. 4. If you still have difficulty reaching the provider, please page the [facility] (Director on Call) for the Hospitalists listed on amion for assistance.  03/23/2020, 10:13 AM

## 2020-03-24 ENCOUNTER — Other Ambulatory Visit (INDEPENDENT_AMBULATORY_CARE_PROVIDER_SITE_OTHER): Payer: Self-pay | Admitting: Internal Medicine

## 2020-03-24 DIAGNOSIS — L89152 Pressure ulcer of sacral region, stage 2: Secondary | ICD-10-CM | POA: Diagnosis not present

## 2020-03-24 MED ORDER — MAGNESIUM SULFATE 2 GM/50ML IV SOLN
2.0000 g | Freq: Once | INTRAVENOUS | Status: AC
  Administered 2020-03-24: 2 g via INTRAVENOUS
  Filled 2020-03-24: qty 50

## 2020-03-24 NOTE — TOC Progression Note (Signed)
Transition of Care [facility]) - Progression Note   Patient Details  Name: Faith [name] MRN: [id number] Date of Birth: Dec 27, 1955  Transition of Care [facility]) CM/SW [place], LCSW Phone Number: 03/24/2020, 12:46 PM  Clinical Narrative: Ebony Hail with [facility] cannot make a bed offer for [facility] or [facility] as they are having difficulty getting paid by [facility] for SNF and [facility] does not accept managed Medicaid. Melissa with [facility] informed TOC the facility is not taking admissions this week. TOC awaiting to hear back from other facilities.  Expected Discharge Plan: Skilled Nursing Facility Barriers to Discharge: SNF Pending bed offer  Expected Discharge Plan and Services Expected Discharge Plan: [place] In-house Referral: Clinical Social Work Post Acute Care Choice: [place] Living arrangements for the past 2 months: [place] Expected Discharge Date: 03/21/20               DME Arranged: N/A  Readmission Risk Interventions Readmission Risk Prevention Plan 03/19/2020  Medication Screening Complete  Transportation Screening Complete  Some recent data might be hidden

## 2020-03-24 NOTE — Progress Notes (Signed)
Physical Therapy Treatment Patient Details Name: Faith [name] MRN: [id number] DOB: 19-Sep-1955 Today's Date: 03/24/2020    History of Present Illness Faith [name] is a 65 y.o. female with medical history significant for multiple sclerosis, COPD, pressure ulcer, hypertension.  Patient was brought to the ED via EMS with complaints of fall, and family not being able to take care of her.  My evaluation patient is awake and alert and able to give me detailed history.  Her knees have just been giving way.  She ambulates with a motorized wheelchair and a walker.  She did not hit her head.  Patient was in the ED yesterday for fall and right shoulder pain, x-ray showed moderate osteoarthritic change she was discharged home.  Family are unable to take care of patient.  She has had a buttock ulcer for a while.  She reports pain from the area.  She would like to be discharged to rehab facility after this hospitalization.  She denies pain with urination, but she reports some urinary frequency, no chest pain, no cough, no difficulty breathing, no vomiting no loose stools no abdominal pain.    PT Comments    Patient agreeable to PT session and initiated with LE AAROM and strengthening to improve motor control and activity tolerance requiring tactile assistance due to generalized weakness.  Pt tolerating activity well without adverse effects and with therapeutic rest periods between trials due to fatigue.  Transfers to sitting EOB with mod A due to generalized weakness and physical support when sitting EOB due to trunk weakness and tolerating techniques for trunk extension for postural control x 5 min.  Continues to exhibit generalized weakness and poor activity tolerance.  Continued PT services indicated whilst hospitalized to improve functional status and recommend continued therapy treatment at next venue of care for maximum benefit.  Follow Up Recommendations  SNF     Equipment Recommendations  None recommended  by PT    Recommendations for Other Services       Precautions / Restrictions      Mobility  Bed Mobility Overal bed mobility: Needs Assistance Bed Mobility: Rolling;Supine to Sit;Sit to Supine Rolling: Min assist   Supine to sit: Mod assist Sit to supine: Mod assist   General bed mobility comments: Patient requires assist and use of bed rails for rolling, transitions to seated with min/mod assist for LE movement and uprighting trunk, assist for LE back into bed  Transfers                    Ambulation/Gait                 Stairs             Wheelchair Mobility    Modified Rankin (Stroke Patients Only)       Balance                                            Cognition                                              Exercises General Exercises - Lower Extremity Ankle Circles/Pumps: Strengthening;Both;20 reps Quad Sets: Strengthening;Both;20 reps Heel Slides: AAROM;Both;20 reps Hip ABduction/ADduction: Strengthening;Both;20 reps  General Comments        Pertinent Vitals/Pain      Home Living                      Prior Function            PT Goals (current goals can now be found in the care plan section) Acute Rehab PT Goals Patient Stated Goal: get strong and return home PT Goal Formulation: With patient Time For Goal Achievement: 03/28/20 Potential to Achieve Goals: Fair Progress towards PT goals: Progressing toward goals    Frequency    Min 3X/week      PT Plan Current plan remains appropriate    Co-evaluation              AM-PAC PT "6 Clicks" Mobility   Outcome Measure  Help needed turning from your back to your side while in a flat bed without using bedrails?: A Little Help needed moving from lying on your back to sitting on the side of a flat bed without using bedrails?: A Lot Help needed moving to and from a bed to a chair (including a wheelchair)?: A  Lot Help needed standing up from a chair using your arms (e.g., wheelchair or bedside chair)?: A Lot Help needed to walk in hospital room?: A Lot Help needed climbing 3-5 steps with a railing? : Total 6 Click Score: 12    End of Session Equipment Utilized During Treatment: Gait belt Activity Tolerance: Patient limited by fatigue Patient left: in bed;with call bell/phone within reach;with bed alarm set Nurse Communication: Mobility status PT Visit Diagnosis: Unsteadiness on feet (R26.81);Muscle weakness (generalized) (M62.81);Other abnormalities of gait and mobility (R26.89);Difficulty in walking, not elsewhere classified (R26.2);History of falling (Z91.81)     Time: 9323-5573 PT Time Calculation (min) (ACUTE ONLY): 22 min  Charges:  $Therapeutic Exercise: 8-22 mins $Therapeutic Activity: 8-22 mins                     1:25 PM, 03/24/20 M. Sherlyn Lees, PT, DPT Physical Therapist- [place] Office Number: [phone]

## 2020-03-24 NOTE — Progress Notes (Addendum)
PROGRESS NOTE    Faith [name]  [id number] DOB: [birth year] DOA: 03/13/2020 PCP: Doree Albee, MD   Brief Narrative:  Faith [name] a 65 y.o.femalewith medical history significant formultiple sclerosis, COPD, pressure ulcer, hypertension. Patient was brought to the ED via EMS with complaints of fall, and family not being able to take care of her.Patient was admitted with SIRS criteria and decubitus ulcer with concern for need for debridement. She has had some worsening weakness and falls. She is now noted to have MRSA bacteremia.She hasbeen started on ciprofloxacin and Flagyl for empiric treatment of appendicitisper GS.  Patient has undergone transesophageal echocardiogram on 1/17 with no findings of vegetations.  ID recommends 4 weeks of IV antibiotics.  PICC line placed 1/17. SNF placement pending.  Assessment & Plan:   Principal Problem:   Decubital ulcer Active Problems:   Falls frequently   Multiple sclerosis (HCC)   SIRS (systemic inflammatory response syndrome) (HCC)   Sacral decubitus ulcer, stage II (HCC)   Acute appendicitis   Bacteremia  MRSA bacteremia -Decubitus ulcer noted, but no other obvious source -Continue vancomycin for total 4 weeks per ID; OPAT placed 1/18 -Repeat blood culturesnegative -2D echocardiogram TTE performed without any signs of vegetations. LVEF 60 to 65% -TEE performed on 1/17 with no vegetations -No new back pain -PICC line placed to RUE on 1/17.  Plan to continue IV antibiotics through 04/10/2020.  Right buttock decubitus ulcer -Appears to be mostly stage II with no need for debridement per general surgery -Continuediet -IV morphine as needed for pain control -Continue wound care protocols -[employer] [place] recommended  Questionable early appendicitis -Without significant abdominal pain/N/V at this time; no need for surgical intervention -Cipro and Flagyl started per GS continue for total of 1 week, completed course on  03/22/2020.  Multiple sclerosis -Multiple falls which may be related -PT evaluation recommending SNF which patient is agreeable to -Continue home pain medications  Hypertension: Controlled. -Propranolol  Hypokalemia/Hypomagnesemia .  Hypomagnesemia today.  Will replace.  Recheck in the morning.  DVT prophylaxis:SCDs Code Status:Full Family Communication:None present. Disposition Plan: Status is: Inpatient  Remains inpatient appropriate because:IV treatments appropriate due to intensity of illness or inability to take PO and Inpatient level of care appropriate due to severity of illness   Dispo: The patient is from:Home Anticipated d/c is to:SNF Anticipated d/c date is:1 to 2 days  Patient currently is medically stable to d/c. SNF placement pending.  Patient was supposed to be discharged to [place] home on Thursday however their admission coordinator did not return our TOC's calls until late Thursday and agreed to take patient on Friday however on Friday again, they notified us that they are not in patient's Medicaid in network plan.  Patient is now waiting for arrangements to be done at another skilled nursing facility.  Patient is medically stable for discharge since 03/20/2020.  Consultants:  General surgery  Cardiology for TEE  Discussed with ID 1/17  Procedures:  See below   Nutritional Assessment:  The patient's BMI is: Body mass index is 16.65 kg/m.Marland Kitchen  Skin Assessment:  I have examined the patient's skin and I agree with the wound assessment as performed by the wound care RN as outlined below:  Pressure Injury 03/13/20 Sacrum Medial Deep Tissue Pressure Injury - Purple or maroon localized area of discolored intact skin or blood-filled blister due to damage of underlying soft tissue from pressure and/or shear. 6.5 cm x 5.5 cm (Active)  03/13/20 1800  Location: Sacrum  Location Orientation:  Medial  Staging: Deep Tissue Pressure Injury - Purple or maroon localized area of discolored intact skin or blood-filled blister due to damage of underlying soft tissue from pressure and/or shear.  Wound Description (Comments): 6.5 cm x 5.5 cm  Present on Admission: Yes     Pressure Injury 03/13/20 Heel Left;Medial Stage 1 -  Intact skin with non-blanchable redness of a localized area usually over a bony prominence. 3 cm x 2.5 cm (Active)  03/13/20 1800  Location: Heel  Location Orientation: Left;Medial  Staging: Stage 1 -  Intact skin with non-blanchable redness of a localized area usually over a bony prominence.  Wound Description (Comments): 3 cm x 2.5 cm  Present on Admission: Yes    Antimicrobials:  Anti-infectives (From admission, onward)   Start     Dose/Rate Route Frequency Ordered Stop   03/22/20 0900  vancomycin (VANCOREADY) IVPB 750 mg/150 mL        750 mg 150 mL/hr over 60 Minutes Intravenous Every 12 hours 03/22/20 0806     03/20/20 0000  vancomycin IVPB  Status:  Discontinued        1,500 mg Intravenous Every 24 hours 03/20/20 1522 03/22/20    03/20/20 0000  ciprofloxacin (CIPRO) 500 MG tablet        500 mg Oral 2 times daily 03/20/20 1522 03/23/20 2359   03/20/20 0000  metroNIDAZOLE (FLAGYL) 500 MG tablet        500 mg Oral Every 8 hours 03/20/20 1522 03/23/20 2359   03/19/20 1400  metroNIDAZOLE (FLAGYL) tablet 500 mg  Status:  Discontinued        500 mg Oral Every 8 hours 03/19/20 1314 03/19/20 1316   03/19/20 1400  metroNIDAZOLE (FLAGYL) tablet 500 mg        500 mg Oral Every 8 hours 03/19/20 1316 03/22/20 0559   03/19/20 1330  ciprofloxacin (CIPRO) tablet 500 mg  Status:  Discontinued        500 mg Oral 2 times daily 03/19/20 1314 03/19/20 1316   03/19/20 1330  ciprofloxacin (CIPRO) tablet 500 mg        500 mg Oral 2 times daily 03/19/20 1316 03/21/20 2233   03/18/20 1730  vancomycin (VANCOREADY) IVPB 1500 mg/300 mL  Status:  Discontinued        1,500 mg 150  mL/hr over 120 Minutes Intravenous Every 24 hours 03/18/20 1658 03/22/20 0806   03/15/20 1300  ciprofloxacin (CIPRO) IVPB 400 mg  Status:  Discontinued        400 mg 200 mL/hr over 60 Minutes Intravenous Every 12 hours 03/15/20 1212 03/19/20 1314   03/15/20 1300  metroNIDAZOLE (FLAGYL) IVPB 500 mg  Status:  Discontinued        500 mg 100 mL/hr over 60 Minutes Intravenous Every 8 hours 03/15/20 1212 03/19/20 1314   03/14/20 1445  vancomycin (VANCOREADY) IVPB 750 mg/150 mL  Status:  Discontinued        750 mg 150 mL/hr over 60 Minutes Intravenous Every 24 hours 03/14/20 1358 03/18/20 1658   03/14/20 1200  vancomycin (VANCOREADY) IVPB 750 mg/150 mL  Status:  Discontinued        750 mg 150 mL/hr over 60 Minutes Intravenous Every 24 hours 03/13/20 1253 03/13/20 1803   03/13/20 2300  cefTRIAXone (ROCEPHIN) 1 g in sodium chloride 0.9 % 100 mL IVPB  Status:  Discontinued        1 g 200 mL/hr over 30 Minutes Intravenous Every 24 hours  03/13/20 1803 03/14/20 1516   03/13/20 1145  ceFEPIme (MAXIPIME) 2 g in sodium chloride 0.9 % 100 mL IVPB        2 g 200 mL/hr over 30 Minutes Intravenous  Once 03/13/20 1130 03/13/20 1313   03/13/20 1145  metroNIDAZOLE (FLAGYL) IVPB 500 mg        500 mg 100 mL/hr over 60 Minutes Intravenous  Once 03/13/20 1130 03/13/20 1311   03/13/20 1115  levofloxacin (LEVAQUIN) IVPB 750 mg  Status:  Discontinued        750 mg 100 mL/hr over 90 Minutes Intravenous  Once 03/13/20 1109 03/13/20 1127   03/13/20 1115  aztreonam (AZACTAM) 2 g in sodium chloride 0.9 % 100 mL IVPB  Status:  Discontinued        2 g 200 mL/hr over 30 Minutes Intravenous  Once 03/13/20 1109 03/13/20 1132   03/13/20 1115  vancomycin (VANCOCIN) IVPB 1000 mg/200 mL premix        1,000 mg 200 mL/hr over 60 Minutes Intravenous  Once 03/13/20 1109 03/13/20 1307      Subjective: Patient seen and examined.  She has no complaints.  Objective: Vitals:   03/23/20 1408 03/23/20 2056 03/24/20 0457 03/24/20  1129  BP: 117/77 130/90 131/81 128/70  Pulse: 74 69 74 68  Resp:  20 16 18   Temp: 98.2 F (36.8 C) (!) 97.4 F (36.3 C) 98.3 F (36.8 C) 98.3 F (36.8 C)  TempSrc: Oral Oral  Oral  SpO2:  99% 97% 95%  Weight:      Height:        Intake/Output Summary (Last 24 hours) at 03/24/2020 1143 Last data filed at 03/24/2020 0550 Gross per 24 hour  Intake 240 ml  Output 1250 ml  Net -1010 ml   Filed Weights   03/13/20 1000 03/13/20 1755  Weight: 45.8 kg 44 kg    Examination:  General exam: Appears calm and comfortable  Respiratory system: Clear to auscultation. Respiratory effort normal. Cardiovascular system: S1 & S2 heard, RRR. No JVD, murmurs, rubs, gallops or clicks. No pedal edema. Gastrointestinal system: Abdomen is nondistended, soft and nontender. No organomegaly or masses felt. Normal bowel sounds heard. Central nervous system: Alert and oriented. No focal neurological deficits. Extremities: Symmetric 5 x 5 power.  Data Reviewed: I have personally reviewed following labs and imaging studies  CBC: Recent Labs  Lab 03/19/20 0509  WBC 10.1  HGB 11.5*  HCT 33.6*  MCV 98.5  PLT 315   Basic Metabolic Panel: Recent Labs  Lab 03/19/20 0509 03/20/20 0522 03/23/20 0930  NA 136 135 136  K 3.2* 4.1 3.9  CL 102 103 104  CO2 26 25 27   GLUCOSE 100* 90 94  BUN 9 7* 9  CREATININE 0.39* 0.51 0.47  CALCIUM 7.9* 7.9* 8.2*  MG 1.5* 1.7 1.6*   GFR: Estimated Creatinine Clearance: 49.3 mL/min (by C-G formula based on SCr of 0.47 mg/dL). Liver Function Tests: No results for input(s): AST, ALT, ALKPHOS, BILITOT, PROT, ALBUMIN in the last 168 hours. No results for input(s): LIPASE, AMYLASE in the last 168 hours. No results for input(s): AMMONIA in the last 168 hours. Coagulation Profile: No results for input(s): INR, PROTIME in the last 168 hours. Cardiac Enzymes: No results for input(s): CKTOTAL, CKMB, CKMBINDEX, TROPONINI in the last 168 hours. BNP (last 3 results) No  results for input(s): PROBNP in the last 8760 hours. HbA1C: No results for input(s): HGBA1C in the last 72 hours. CBG: No results  for input(s): GLUCAP in the last 168 hours. Lipid Profile: No results for input(s): CHOL, HDL, LDLCALC, TRIG, CHOLHDL, LDLDIRECT in the last 72 hours. Thyroid Function Tests: No results for input(s): TSH, T4TOTAL, FREET4, T3FREE, THYROIDAB in the last 72 hours. Anemia Panel: No results for input(s): VITAMINB12, FOLATE, FERRITIN, TIBC, IRON, RETICCTPCT in the last 72 hours. Sepsis Labs: No results for input(s): PROCALCITON, LATICACIDVEN in the last 168 hours.  Recent Results (from the past 240 hour(s))  MRSA PCR Screening     Status: None   Collection Time: 03/14/20  2:01 PM   Specimen: Nasal Mucosa; Nasopharyngeal  Result Value Ref Range Status   MRSA by PCR NEGATIVE NEGATIVE Final    Comment:        The GeneXpert MRSA Assay (FDA approved for NASAL specimens only), is one component of a comprehensive MRSA colonization surveillance program. It is not intended to diagnose MRSA infection nor to guide or monitor treatment for MRSA infections. Performed at [facility], [address] [place], [place] [address]   Culture, blood (routine x 2)     Status: None   Collection Time: 03/14/20  2:05 PM   Specimen: BLOOD  Result Value Ref Range Status   Specimen Description BLOOD LEFT ANTECUBITAL  Final   Special Requests   Final    Blood Culture adequate volume BOTTLES DRAWN AEROBIC AND ANAEROBIC   Culture   Final    NO GROWTH 5 DAYS Performed at [facility], [address] [place], [place] [address]    Report Status 03/19/2020 FINAL  Final  Culture, blood (routine x 2)     Status: None   Collection Time: 03/14/20  2:09 PM   Specimen: BLOOD  Result Value Ref Range Status   Specimen Description BLOOD BLOOD LEFT HAND  Final   Special Requests   Final    Blood Culture adequate volume BOTTLES DRAWN AEROBIC AND ANAEROBIC   Culture   Final    NO GROWTH 5  DAYS Performed at [facility], [address] [place], [place] [address]    Report Status 03/19/2020 FINAL  Final    Radiology Studies: No results found.  Scheduled Meds: . atorvastatin  20 mg Oral Daily  . baclofen  10 mg Oral TID  . celecoxib  200 mg Oral BID  . Chlorhexidine Gluconate Cloth  6 each Topical Daily  . collagenase   Topical Daily  . enoxaparin (LOVENOX) injection  30 mg Subcutaneous Q24H  . gabapentin  600 mg Oral TID  . propranolol  20 mg Oral BID   Continuous Infusions: . magnesium sulfate bolus IVPB    . vancomycin 750 mg (03/24/20 0947)     LOS: 11 days   Time spent: 25 minutes  Darliss Cheney, MD How to contact the [facility] Attending or Consulting provider [place] or covering provider during after hours [place], for this patient?  1. Check the care team in [facility] and look for a) attending/consulting TRH provider listed and b) the [facility] team listed 2. Log into [email or web address] and use [place]'s universal password to access. If you do not have the password, please contact the hospital operator. 3. Locate the [facility] provider you are looking for under [facility] and page to a number that you can be directly reached. 4. If you still have difficulty reaching the provider, please page the [facility] (Director on Call) for the Hospitalists listed on amion for assistance.  03/24/2020, 11:43 AM

## 2020-03-24 NOTE — Telephone Encounter (Signed)
Faxed letterhead to fax number provided and received the confirmation that the fax went through.  Eustace Pen at [phone] and left a detailed voice message that I faxed this through for the patient.

## 2020-03-24 NOTE — TOC Progression Note (Signed)
Transition of Care [facility]) - Progression Note    Patient Details  Name: OSCEOLA HOLIAN MRN: [id number] Date of Birth: 03-Mar-1955  [facility] CM/SW Contact  Salome Arnt, [place] Phone Number: 03/24/2020, 10:24 AM  Clinical Narrative:   LCSW called multiple facilities this morning regarding SNF referral.   [facility]- will review and return call. [facility]- admissions closed next 2 weeks.  [facility]- admissions will review and return call. [facility]- left voicemail [facility]- admissions reviewing [facility]- admissions reviewing [facility]- left voicemail for admissions on office number and cell phone [facility]- left voicemail for admissions.  TOC will follow up on above conversations and continue to seek placement for pt.     Expected Discharge Plan: [place] Barriers to Discharge: Continued Medical Work up  Expected Discharge Plan and Services Expected Discharge Plan: [place] In-house Referral: Clinical Social Work   Post Acute Care Choice: [place] Living arrangements for the past 2 months: Single Family Home Expected Discharge Date: 03/21/20               DME Arranged: N/A                     Social Determinants of Health (SDOH) Interventions    Readmission Risk Interventions Readmission Risk Prevention Plan 03/19/2020  Medication Screening Complete  Transportation Screening Complete  Some recent data might be hidden

## 2020-03-24 NOTE — Telephone Encounter (Signed)
Caryl Pina RN with [employer] called and left a detailed voice message to let us know that she spoke to patient and case manager and the patient has declined long term facility care and will be going to rehab once released from the hospital and Caryl Pina would like to proceed with the following request for the patient.  Medical diagnosis code needs to be for Multiple Sclerosis  Please see previous request for Letterhead paper or Rx pad paper

## 2020-03-25 ENCOUNTER — Telehealth: Payer: Self-pay | Admitting: *Deleted

## 2020-03-25 DIAGNOSIS — L89152 Pressure ulcer of sacral region, stage 2: Secondary | ICD-10-CM | POA: Diagnosis not present

## 2020-03-25 LAB — MAGNESIUM: Magnesium: 1.8 mg/dL (ref 1.7–2.4)

## 2020-03-25 LAB — VANCOMYCIN, TROUGH: Vancomycin Tr: 8 ug/mL — ABNORMAL LOW (ref 15–20)

## 2020-03-25 MED ORDER — VANCOMYCIN HCL IN DEXTROSE 1-5 GM/200ML-% IV SOLN
1000.0000 mg | Freq: Two times a day (BID) | INTRAVENOUS | Status: DC
  Administered 2020-03-25 – 2020-03-28 (×7): 1000 mg via INTRAVENOUS
  Filled 2020-03-25 (×9): qty 200

## 2020-03-25 NOTE — Telephone Encounter (Addendum)
Have called daughter- she's really upset, "because her mother now has MRSA bacteremia and they are trying to send her home, but she's such a high level care, they cannot care for her" per the daughter - they "TRIED- but she, the daughter is just not able to"- she came back in with hypotension as well as AKI and bacteremia- pt's daughter reports she's ~ 105 lbs, and pt is 97 lbs- and pt's daughter has back issues which make it difficult to lift > 1 gallon of milk.    I explained if she needs to go to SNF, she needs to-however it sometimes takes time to get pts to nursing home, esp long term care, which is required in this case.  This patient has a BMI of 16.5- was 18-19 when last seen. She has severe MS- AND cervical myelopathy-  Discussed with pt's AP physician - he agreed pt is NOT safe to d/c home  and needs SNF placement- she cannot go home safely, however it might be possible for a short term - I.e. a few weeks if training is done, [employer] lift, and other equipment, etc.    Also calling pt's SW to discuss this same issue- explained pt has multiple chronic medical issues that made them search for a SNF bed prior to current hospital admission.  They also have hospital bed at home, but not hoyer lift, not a ramp (trying from insurance to get it-they've measured, but haven't placed yet - same as grab bars)- also need more equipment/help at home.   . 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 She also has pressure ulcer, spasticity and very slowed processing chronically.   Pt's daughter has been concerned that pt came into the hospital "and she wasn't able to be there in ER, or in holding area"- she was very scared/sobbing on phone because pt was  so out of it/confused/almost unconscious when she sent her back to hospital via ambulance, that she wasn't safe- found to have MRSA bacteremia at this time.  She pointed out that pt needed an advocate at that time since not able to speak for herself- she wasn't even able to say daughter's name when she called 911, and wasn't able to report her own birthday.

## 2020-03-25 NOTE — Progress Notes (Addendum)
Pharmacy Antibiotic Note  Faith [name] is a 65 y.o. female admitted on 03/13/2020 with bacteremia and intra abdominal infection.  Pharmacy has been consulted for Vancomycin dosing. VT reported 8 mcg/ml, subtherapeutic. WIll adjust dose. Patient is improving and repeat BCXs are negative . TEE on 1/17 with no findings of vetgetations. ID recoomends 4 weeks of IV abx. SNF placement pending.  In addition, will adjust OPAT orders Plan: Increase Vancomycin 1000mg  IV every 12 hours. Monitor labs, c/s, and further antibiotics. Adjust OPAT orders  Height: 5\' 4"  (162.6 cm) Weight: 44 kg (97 lb) IBW/kg (Calculated) : 54.7  Temp (24hrs), Avg:98.1 F (36.7 C), Min:98 F (36.7 C), Max:98.2 F (36.8 C)  Recent Labs  Lab 03/19/20 0509 03/20/20 0522 03/21/20 1849 03/23/20 0930 03/25/20 0935  WBC 10.1  --   --   --   --   CREATININE 0.39* 0.51  --  0.47  --   VANCOTROUGH  --   --  4*  --  8*    Estimated Creatinine Clearance: 49.3 mL/min (by C-G formula based on SCr of 0.47 mg/dL).    Allergies  Allergen Reactions  . Darvon [Propoxyphene] Other (See Comments)    Hallicuations  . Oxycodone-Acetaminophen     Does not have any affect on her  . Penicillins Rash    Did it involve swelling of the face/tongue/throat, SOB, or low BP? Unknown Did it involve sudden or severe rash/hives, skin peeling, or any reaction on the inside of your mouth or nose? Unknown Did you need to seek medical attention at a hospital or doctor's office? Unknown When did it last happen?Unknown If all above answers are "NO", may proceed with cephalosporin use.     Antimicrobials this admission: Vanco 1/13 >>  Cefepime 1/13 x 1 Flagyl 1/13>> cipro 1/15 >>  Rocephin 1/13>> 1/14  Microbiology results: 1/13 BCx: MRSA 1/14 BCX: ngtd 1/13 UCx: multiple species suggest recollection 1/13 Covid/flu: negative  Thank you for allowing pharmacy to be a part of this patient's care.  Isac Sarna, BS Pharm D,  [place] Clinical Pharmacist Pager [phone] 03/25/2020 11:58 AM

## 2020-03-25 NOTE — TOC Progression Note (Signed)
Transition of Care [facility]) - Progression Note   Patient Details  Name: Faith [name] MRN: [id number] Date of Birth: 02/05/1956  Transition of Care [facility]) CM/SW [place], LCSW Phone Number: 03/25/2020, 3:51 PM  Clinical Narrative: Patient continues to not receive bed offers even though the referrals have been sent state-wide. TOC leadership asked CSW to work towards discharging home with IV antibiotics and [facility] services if possible as patient was being referred to SNF for IV antibiotics. CSW spoke with patient to discuss long-term plans if SNF is not an option. Patient reported she was getting PT, OT, and wound care through [place], but did not have their contact information. Patient also reported she gets PCS through [facility] based out of [place] and gave CSW permission to call and confirm services.  CSW spoke with [place] at [facility]. Per Lenna Sciara, patient receives [facility] Monday, Wednesday-Friday 11am-2pm.  CSW received call from Dr. Philippa Sicks, who works with patient for her MS, regarding patient not getting placement. CSW explained SNF bed placement limitations and that patient was being referred out to SNF for IV antibiotics, which can be done at home with [facility]. Dr. Alveta Heimlich expressed concerns about the family no longer being able to care for her at home as the family was working on long-term care placement prior to her admission. CSW explained patient was referred out for SNF, which is for short-term rehab only and the family would need to continue working on LTC placement after SNF anyway as that was never the plan with her being referred out for IV antibiotics. TOC supervisor updated regarding the call.  Expected Discharge Plan: Skilled Nursing Facility Barriers to Discharge: SNF Pending bed offer  Expected Discharge Plan and Services Expected Discharge Plan: [place] In-house Referral: Clinical Social Work Post Acute  Care Choice: [place] Living arrangements for the past 2 months: [place] Expected Discharge Date: 03/21/20               DME Arranged: N/A  Readmission Risk Interventions Readmission Risk Prevention Plan 03/19/2020  Medication Screening Complete  Transportation Screening Complete  Some recent data might be hidden

## 2020-03-25 NOTE — Progress Notes (Signed)
PROGRESS NOTE    GIBSON GATTA  [id number] DOB: [birth year] DOA: 03/13/2020 PCP: Doree Albee, MD   Brief Narrative:  [name] a 65 y.o.femalewith medical history significant formultiple sclerosis, COPD, pressure ulcer, hypertension. Patient was brought to the ED via EMS with complaints of fall, and family not being able to take care of her.Patient was admitted with SIRS criteria and decubitus ulcer with concern for need for debridement. She has had some worsening weakness and falls. She is now noted to have MRSA bacteremia.She hasbeen started on ciprofloxacin and Flagyl for empiric treatment of appendicitisper GS.  Patient has undergone transesophageal echocardiogram on 1/17 with no findings of vegetations.  ID recommends 4 weeks of IV antibiotics.  PICC line placed 1/17. SNF placement pending.  Assessment & Plan:   Principal Problem:   Decubital ulcer Active Problems:   Falls frequently   Multiple sclerosis (HCC)   SIRS (systemic inflammatory response syndrome) (HCC)   Sacral decubitus ulcer, stage II (HCC)   Acute appendicitis   Bacteremia  MRSA bacteremia -Decubitus ulcer noted, but no other obvious source -Continue vancomycin for total 4 weeks per ID; OPAT placed 1/18 -Repeat blood culturesnegative -2D echocardiogram TTE performed without any signs of vegetations. LVEF 60 to 65% -TEE performed on 1/17 with no vegetations -No new back pain -PICC line placed to RUE on 1/17.  Plan to continue IV antibiotics through 04/10/2020.  Right buttock decubitus ulcer -Appears to be mostly stage II with no need for debridement per general surgery -Continuediet -IV morphine as needed for pain control -Continue wound care protocols -[employer] recommended  Questionable early appendicitis -Without significant abdominal pain/N/V at this time; no need for surgical intervention -Cipro and Flagyl started per GS continue for total of 1 week, completed course on  03/22/2020.  Multiple sclerosis -Multiple falls which may be related -PT evaluation recommending SNF which patient is agreeable to -Continue home pain medications  Hypertension: Controlled. -Propranolol  Hypokalemia/Hypomagnesemia .  Hypomagnesemia today.  Will replace.  Recheck in the morning.  Placement issues: Patient has been waiting for SNF for last several days however due to her having Medicaid insurance, I will wonderful [facility] personnel have been unable to find a bed for her.  Due to surge in Covid, I was asked to try to discharge her home with home health.  I tried calling patient's daughter twice and left a voicemail.  I was unable to connect with her.  Apparently daughter had received call from social worker indicating that patient will be discharged home.  Patient's daughter apparently was very worried and concerned about that and she had called patient's physiatrist Dr. Dagoberto Ligas who had reached out to me with her concerns as she knows patient very well due to patient having spine issues and MS.  According to her, patient is not in a condition to be sent home and she highly recommended that patient should be sent to SNF as planned and should be kept in the hospital until placement is found.  She is also going to reach out to Education officer, museum.  For the sake of patient safety, we are going to keep patient in the hospital until we find SNF placement on this patient significantly improved physically and PT changes their recommendations.  DVT prophylaxis: Lovenox Code Status:Full Family Communication:None present.  Tried calling patient's daughter twice. Disposition Plan: Status is: Inpatient  Remains inpatient appropriate because:IV treatments appropriate due to intensity of illness or inability to take PO and Inpatient level of  care appropriate due to severity of illness   Dispo: The patient is from:Home Anticipated d/c is to:SNF Anticipated d/c date  is:1 to 2 days  Patient currently is medically stable to d/c. SNF placement pending.  Patient was supposed to be discharged to [facility] on Thursday however their admission coordinator did not return our TOC's calls until late Thursday and agreed to take patient on Friday however on Friday again, they notified us that they are not in patient's Medicaid in network plan.  Patient is now waiting for arrangements to be done at another skilled nursing facility.  Patient is medically stable for discharge to SNF since 03/20/2020.  Consultants:  General surgery  Cardiology for TEE  Discussed with ID 1/17  Procedures:  See below   Nutritional Assessment:  The patient's BMI is: Body mass index is 16.65 kg/m.Marland Kitchen  Skin Assessment:  I have examined the patient's skin and I agree with the wound assessment as performed by the wound care RN as outlined below:  Pressure Injury 03/13/20 Sacrum Medial Deep Tissue Pressure Injury - Purple or maroon localized area of discolored intact skin or blood-filled blister due to damage of underlying soft tissue from pressure and/or shear. 6.5 cm x 5.5 cm (Active)  03/13/20 1800  Location: Sacrum  Location Orientation: Medial  Staging: Deep Tissue Pressure Injury - Purple or maroon localized area of discolored intact skin or blood-filled blister due to damage of underlying soft tissue from pressure and/or shear.  Wound Description (Comments): 6.5 cm x 5.5 cm  Present on Admission: Yes     Pressure Injury 03/13/20 Heel Left;Medial Stage 1 -  Intact skin with non-blanchable redness of a localized area usually over a bony prominence. 3 cm x 2.5 cm (Active)  03/13/20 1800  Location: Heel  Location Orientation: Left;Medial  Staging: Stage 1 -  Intact skin with non-blanchable redness of a localized area usually over a bony prominence.  Wound Description (Comments): 3 cm x 2.5 cm  Present on Admission: Yes    Antimicrobials:   Anti-infectives (From admission, onward)   Start     Dose/Rate Route Frequency Ordered Stop   03/25/20 2100  vancomycin (VANCOCIN) IVPB 1000 mg/200 mL premix        1,000 mg 200 mL/hr over 60 Minutes Intravenous Every 12 hours 03/25/20 1204     03/22/20 0900  vancomycin (VANCOREADY) IVPB 750 mg/150 mL  Status:  Discontinued        750 mg 150 mL/hr over 60 Minutes Intravenous Every 12 hours 03/22/20 0806 03/25/20 1204   03/20/20 0000  vancomycin IVPB  Status:  Discontinued        1,500 mg Intravenous Every 24 hours 03/20/20 1522 03/22/20    03/20/20 0000  ciprofloxacin (CIPRO) 500 MG tablet        500 mg Oral 2 times daily 03/20/20 1522 03/23/20 2359   03/20/20 0000  metroNIDAZOLE (FLAGYL) 500 MG tablet        500 mg Oral Every 8 hours 03/20/20 1522 03/23/20 2359   03/19/20 1400  metroNIDAZOLE (FLAGYL) tablet 500 mg  Status:  Discontinued        500 mg Oral Every 8 hours 03/19/20 1314 03/19/20 1316   03/19/20 1400  metroNIDAZOLE (FLAGYL) tablet 500 mg        500 mg Oral Every 8 hours 03/19/20 1316 03/22/20 0559   03/19/20 1330  ciprofloxacin (CIPRO) tablet 500 mg  Status:  Discontinued        500  mg Oral 2 times daily 03/19/20 1314 03/19/20 1316   03/19/20 1330  ciprofloxacin (CIPRO) tablet 500 mg        500 mg Oral 2 times daily 03/19/20 1316 03/21/20 2233   03/18/20 1730  vancomycin (VANCOREADY) IVPB 1500 mg/300 mL  Status:  Discontinued        1,500 mg 150 mL/hr over 120 Minutes Intravenous Every 24 hours 03/18/20 1658 03/22/20 0806   03/15/20 1300  ciprofloxacin (CIPRO) IVPB 400 mg  Status:  Discontinued        400 mg 200 mL/hr over 60 Minutes Intravenous Every 12 hours 03/15/20 1212 03/19/20 1314   03/15/20 1300  metroNIDAZOLE (FLAGYL) IVPB 500 mg  Status:  Discontinued        500 mg 100 mL/hr over 60 Minutes Intravenous Every 8 hours 03/15/20 1212 03/19/20 1314   03/14/20 1445  vancomycin (VANCOREADY) IVPB 750 mg/150 mL  Status:  Discontinued        750 mg 150 mL/hr over 60  Minutes Intravenous Every 24 hours 03/14/20 1358 03/18/20 1658   03/14/20 1200  vancomycin (VANCOREADY) IVPB 750 mg/150 mL  Status:  Discontinued        750 mg 150 mL/hr over 60 Minutes Intravenous Every 24 hours 03/13/20 1253 03/13/20 1803   03/13/20 2300  cefTRIAXone (ROCEPHIN) 1 g in sodium chloride 0.9 % 100 mL IVPB  Status:  Discontinued        1 g 200 mL/hr over 30 Minutes Intravenous Every 24 hours 03/13/20 1803 03/14/20 1516   03/13/20 1145  ceFEPIme (MAXIPIME) 2 g in sodium chloride 0.9 % 100 mL IVPB        2 g 200 mL/hr over 30 Minutes Intravenous  Once 03/13/20 1130 03/13/20 1313   03/13/20 1145  metroNIDAZOLE (FLAGYL) IVPB 500 mg        500 mg 100 mL/hr over 60 Minutes Intravenous  Once 03/13/20 1130 03/13/20 1311   03/13/20 1115  levofloxacin (LEVAQUIN) IVPB 750 mg  Status:  Discontinued        750 mg 100 mL/hr over 90 Minutes Intravenous  Once 03/13/20 1109 03/13/20 1127   03/13/20 1115  aztreonam (AZACTAM) 2 g in sodium chloride 0.9 % 100 mL IVPB  Status:  Discontinued        2 g 200 mL/hr over 30 Minutes Intravenous  Once 03/13/20 1109 03/13/20 1132   03/13/20 1115  vancomycin (VANCOCIN) IVPB 1000 mg/200 mL premix        1,000 mg 200 mL/hr over 60 Minutes Intravenous  Once 03/13/20 1109 03/13/20 1307      Subjective: Patient seen and examined.  She has no complaints.  Objective: Vitals:   03/24/20 1500 03/24/20 2057 03/25/20 0548 03/25/20 1004  BP: 109/63 130/71 129/76 118/83  Pulse: 70 68 71 67  Resp: 18 18 18    Temp: 98 F (36.7 C) 98.2 F (36.8 C) 98 F (36.7 C)   TempSrc: Oral Oral Oral   SpO2: 97% 97% 96%   Weight:      Height:        Intake/Output Summary (Last 24 hours) at 03/25/2020 1359 Last data filed at 03/25/2020 1019 Gross per 24 hour  Intake 480 ml  Output 1850 ml  Net -1370 ml   Filed Weights   03/13/20 1000 03/13/20 1755  Weight: 45.8 kg 44 kg    Examination:  General exam: Appears calm and comfortable  Respiratory system: Clear  to auscultation. Respiratory effort normal. Cardiovascular system: S1 &  S2 heard, RRR. No JVD, murmurs, rubs, gallops or clicks. No pedal edema. Gastrointestinal system: Abdomen is nondistended, soft and nontender. No organomegaly or masses felt. Normal bowel sounds heard. Central nervous system: Alert and oriented. No focal neurological deficits. Extremities: Symmetric 5 x 5 power.   Data Reviewed: I have personally reviewed following labs and imaging studies  CBC: Recent Labs  Lab 03/19/20 0509  WBC 10.1  HGB 11.5*  HCT 33.6*  MCV 98.5  PLT 654   Basic Metabolic Panel: Recent Labs  Lab 03/19/20 0509 03/20/20 0522 03/23/20 0930 03/25/20 0551  NA 136 135 136  --   K 3.2* 4.1 3.9  --   CL 102 103 104  --   CO2 26 25 27   --   GLUCOSE 100* 90 94  --   BUN 9 7* 9  --   CREATININE 0.39* 0.51 0.47  --   CALCIUM 7.9* 7.9* 8.2*  --   MG 1.5* 1.7 1.6* 1.8   GFR: Estimated Creatinine Clearance: 49.3 mL/min (by C-G formula based on SCr of 0.47 mg/dL). Liver Function Tests: No results for input(s): AST, ALT, ALKPHOS, BILITOT, PROT, ALBUMIN in the last 168 hours. No results for input(s): LIPASE, AMYLASE in the last 168 hours. No results for input(s): AMMONIA in the last 168 hours. Coagulation Profile: No results for input(s): INR, PROTIME in the last 168 hours. Cardiac Enzymes: No results for input(s): CKTOTAL, CKMB, CKMBINDEX, TROPONINI in the last 168 hours. BNP (last 3 results) No results for input(s): PROBNP in the last 8760 hours. HbA1C: No results for input(s): HGBA1C in the last 72 hours. CBG: No results for input(s): GLUCAP in the last 168 hours. Lipid Profile: No results for input(s): CHOL, HDL, LDLCALC, TRIG, CHOLHDL, LDLDIRECT in the last 72 hours. Thyroid Function Tests: No results for input(s): TSH, T4TOTAL, FREET4, T3FREE, THYROIDAB in the last 72 hours. Anemia Panel: No results for input(s): VITAMINB12, FOLATE, FERRITIN, TIBC, IRON, RETICCTPCT in the last 72  hours. Sepsis Labs: No results for input(s): PROCALCITON, LATICACIDVEN in the last 168 hours.  No results found for this or any previous visit (from the past 240 hour(s)).  Radiology Studies: No results found.  Scheduled Meds: . atorvastatin  20 mg Oral Daily  . baclofen  10 mg Oral TID  . celecoxib  200 mg Oral BID  . Chlorhexidine Gluconate Cloth  6 each Topical Daily  . collagenase   Topical Daily  . enoxaparin (LOVENOX) injection  30 mg Subcutaneous Q24H  . gabapentin  600 mg Oral TID  . propranolol  20 mg Oral BID   Continuous Infusions: . vancomycin       LOS: 12 days   Time spent: 35 minutes  Darliss Cheney, MD How to contact the [facility] Attending or Consulting provider [place] or covering provider during after hours [facility], for this patient?  1. Check the care team in [facility] and look for a) attending/consulting TRH provider listed and b) the [facility] team listed 2. Log into [email or web address] and use [place]'s universal password to access. If you do not have the password, please contact the hospital operator. 3. Locate the [facility] provider you are looking for under [facility] and page to a number that you can be directly reached. 4. If you still have difficulty reaching the provider, please page the [facility] (Director on Call) for the Hospitalists listed on amion for assistance.  03/25/2020, 1:59 PM

## 2020-03-25 NOTE — Telephone Encounter (Signed)
Faith [name], [name]'s daughter called to report that [name] is currently admitted to [facility] with MRSA.  Faith [name] is asking for a call back from Dr Dagoberto Ligas. She has some questions she would like to discuss with Dr Dagoberto Ligas.  Her # is [phone].

## 2020-03-26 DIAGNOSIS — R296 Repeated falls: Secondary | ICD-10-CM | POA: Diagnosis not present

## 2020-03-26 DIAGNOSIS — L89152 Pressure ulcer of sacral region, stage 2: Secondary | ICD-10-CM | POA: Diagnosis not present

## 2020-03-26 DIAGNOSIS — R7881 Bacteremia: Secondary | ICD-10-CM | POA: Diagnosis not present

## 2020-03-26 LAB — CREATININE, SERUM
Creatinine, Ser: 0.37 mg/dL — ABNORMAL LOW (ref 0.44–1.00)
GFR, Estimated: >60 mL/min (ref >60)

## 2020-03-26 NOTE — Progress Notes (Signed)
Progress Note    Faith [name]  WUX:[id number] DOB: March 29, 1955  DOA: 03/13/2020 PCP: Doree Albee, MD    Brief Narrative:     Medical records reviewed and are as summarized below:  Faith [name] is an 65 y.o. female with medical history significant formultiple sclerosis, COPD, pressure ulcer, hypertension. Patient was brought to the ED via EMS with complaints of fall, and family not being able to take care of her.Patient was admitted with SIRS criteria and decubitus ulcer with concern for need for debridement. She has had some worsening weakness and falls. She is now noted to have MRSA bacteremia.She hasbeen started on ciprofloxacin and Flagyl for empiric treatment of appendicitisper GS.Patient has undergone transesophageal echocardiogram on 1/17 with no findings of vegetations. ID recommends 4 weeks of IV antibiotics. PICC line placed 1/17. SNF placement pending.  Assessment/Plan:   Principal Problem:   Decubital ulcer Active Problems:   Falls frequently   Multiple sclerosis (HCC)   SIRS (systemic inflammatory response syndrome) (HCC)   Sacral decubitus ulcer, stage II (HCC)   Acute appendicitis   Bacteremia   MRSA bacteremia -Decubitus ulcer noted, but no other obvious source -Continue vancomycinfor total 4 weeks per ID; OPAT placed 1/18 -Repeat blood culturesnegative -2D echocardiogram TTE performed without any signs of vegetations. LVEF 60 to 65% -TEE performed on 1/17 with no vegetations -PICC line placed: 1/17.   IV antibiotics through 04/10/2020.  Right buttock decubitus ulcer -Appears to be mostly stage II with no need for debridement per general surgery -Continuediet -IV morphine as needed for pain control -Continue wound care protocols -Air Mattress recommended  Questionable early appendicitis -Without significant abdominal pain/N/V at this time; no need for surgical intervention -Cipro and Flagyl started per GScontinue for total of 1  week, completed course on 03/22/2020.  Multiple sclerosis -Multiple falls which may be related -PT evaluation recommending SNF which patient is agreeable to -await placement  Hypertension: Controlled. -Propranolol  Hypokalemia/Hypomagnesemia  -repleted  Placement issues: Patient has been waiting for SNF for last several days however due to her having Medicaid insurance, TOC personnel have been unable to find a bed for her.  Patient's physiatrist Dr. Dagoberto Ligas had concerns as she knows patient very well due to patient having spine issues and MS.  According to her, patient is not in a condition to be sent home and she highly recommended that patient should be sent to SNF as planned and should be kept in the hospital until placement is found.   For the sake of patient safety, we are going to keep patient in the hospital until we find SNF placement or patient significantly improved physically and PT changes their recommendations.   Family Communication/Anticipated D/C date and plan/Code Status   DVT prophylaxis: Lovenox ordered. Code Status: Full Code.  Disposition Plan: Status is: Inpatient  Remains inpatient appropriate because:unsafe d/c plan   Dispo: The patient is from: Home              Anticipated d/c is to: SNF              Anticipated d/c date is: 1 day              Patient currently is medically stable to d/c.   Difficult to place patient Yes         Medical Consultants:    None.   Anti-Infectives:    None  Subjective:   Concerned about going home with son  Objective:  Vitals:   03/25/20 1424 03/25/20 2054 03/26/20 0547 03/26/20 0842  BP: 103/70 112/67 (!) 142/79 139/84  Pulse: 66 70 69 66  Resp: 18 18 18    Temp: (!) 97.4 F (36.3 C) 98.2 F (36.8 C) 97.7 F (36.5 C)   TempSrc: Oral Oral Oral   SpO2: 98% 96% 96%   Weight:      Height:        Intake/Output Summary (Last 24 hours) at 03/26/2020 0935 Last data filed at 03/26/2020 7846 Gross  per 24 hour  Intake 920 ml  Output 1850 ml  Net -930 ml   Filed Weights   03/13/20 1000 03/13/20 1755  Weight: 45.8 kg 44 kg    Exam:  General: Appearance:    Thin female in no acute distress     Lungs:      respirations unlabored  Heart:    Normal heart rate. Normal rhythm. No murmurs, rubs, or gallops.   MS:   All extremities are intact.   Neurologic:   Awake, alert, oriented x 3.     Data Reviewed:   I have personally reviewed following labs and imaging studies:  Labs: Labs show the following:   Basic Metabolic Panel: Recent Labs  Lab 03/20/20 0522 03/23/20 0930 03/25/20 0551 03/26/20 0619  NA 135 136  --   --   K 4.1 3.9  --   --   CL 103 104  --   --   CO2 25 27  --   --   GLUCOSE 90 94  --   --   BUN 7* 9  --   --   CREATININE 0.51 0.47  --  0.37*  CALCIUM 7.9* 8.2*  --   --   MG 1.7 1.6* 1.8  --    GFR Estimated Creatinine Clearance: 49.3 mL/min (A) (by C-G formula based on SCr of 0.37 mg/dL (L)). Liver Function Tests: No results for input(s): AST, ALT, ALKPHOS, BILITOT, PROT, ALBUMIN in the last 168 hours. No results for input(s): LIPASE, AMYLASE in the last 168 hours. No results for input(s): AMMONIA in the last 168 hours. Coagulation profile No results for input(s): INR, PROTIME in the last 168 hours.  CBC: No results for input(s): WBC, NEUTROABS, HGB, HCT, MCV, PLT in the last 168 hours. Cardiac Enzymes: No results for input(s): CKTOTAL, CKMB, CKMBINDEX, TROPONINI in the last 168 hours. BNP (last 3 results) No results for input(s): PROBNP in the last 8760 hours. CBG: No results for input(s): GLUCAP in the last 168 hours. D-Dimer: No results for input(s): DDIMER in the last 72 hours. Hgb A1c: No results for input(s): HGBA1C in the last 72 hours. Lipid Profile: No results for input(s): CHOL, HDL, LDLCALC, TRIG, CHOLHDL, LDLDIRECT in the last 72 hours. Thyroid function studies: No results for input(s): TSH, T4TOTAL, T3FREE, THYROIDAB in the  last 72 hours.  Invalid input(s): FREET3 Anemia work up: No results for input(s): VITAMINB12, FOLATE, FERRITIN, TIBC, IRON, RETICCTPCT in the last 72 hours. Sepsis Labs: No results for input(s): PROCALCITON, WBC, LATICACIDVEN in the last 168 hours.  Microbiology No results found for this or any previous visit (from the past 240 hour(s)).  Procedures and diagnostic studies:  No results found.  Medications:   . atorvastatin  20 mg Oral Daily  . baclofen  10 mg Oral TID  . celecoxib  200 mg Oral BID  . Chlorhexidine Gluconate Cloth  6 each Topical Daily  . collagenase   Topical Daily  .  enoxaparin (LOVENOX) injection  30 mg Subcutaneous Q24H  . gabapentin  600 mg Oral TID  . propranolol  20 mg Oral BID   Continuous Infusions: . vancomycin 1,000 mg (03/26/20 0846)     LOS: 13 days   Geradine Girt  [facility]   How to contact the [facility] Attending or Consulting provider [place] or covering provider during after hours [place], for this patient?  1. Check the care team in [place] [place] and look for a) attending/consulting TRH provider listed and b) the [facility] team listed 2. Log into [email or web address] and use [place]'s universal password to access. If you do not have the password, please contact the hospital operator. 3. Locate the [facility] provider you are looking for under [facility] and page to a number that you can be directly reached. 4. If you still have difficulty reaching the provider, please page the [facility] (Director on Call) for the Hospitalists listed on amion for assistance.  03/26/2020, 9:35 AM

## 2020-03-26 NOTE — Care Management (Addendum)
Spoke with case manager, Caryl Pina at [phone]- patient receives [facility] in her home ( 12 hours per week, could receive up to 18.5)  Provided the following LTC facilities in [place] [facility] and [place] and [place] at [place] [facility] [place] [facility] [place] [facility] [phone] Tresa Garter  ( now [place]) [phone] [place] [phone]

## 2020-03-27 DIAGNOSIS — D72829 Elevated white blood cell count, unspecified: Secondary | ICD-10-CM | POA: Diagnosis not present

## 2020-03-27 DIAGNOSIS — G35 Multiple sclerosis: Secondary | ICD-10-CM | POA: Diagnosis not present

## 2020-03-27 DIAGNOSIS — L89152 Pressure ulcer of sacral region, stage 2: Secondary | ICD-10-CM | POA: Diagnosis not present

## 2020-03-27 DIAGNOSIS — R7881 Bacteremia: Secondary | ICD-10-CM | POA: Diagnosis not present

## 2020-03-27 NOTE — Progress Notes (Signed)
Progress Note    [name]  [id number] DOB: [birth year]  DOA: 03/13/2020 PCP: Doree Albee, MD    Brief Narrative:     Medical records reviewed and are as summarized below:  Faith [name] is an 65 y.o. female with medical history significant formultiple sclerosis, COPD, pressure ulcer, hypertension. Patient was brought to the ED via EMS with complaints of fall, and family not being able to take care of her.Patient was admitted with SIRS criteria and decubitus ulcer with concern for need for debridement. She has had some worsening weakness and falls. She is now noted to have MRSA bacteremia.She hasbeen started on ciprofloxacin and Flagyl for empiric treatment of appendicitisper GS.Patient has undergone transesophageal echocardiogram on 1/17 with no findings of vegetations. ID recommends 4 weeks of IV antibiotics. PICC line placed 1/17. SNF placement being worked on but this may not be able to happen due to insurance/bed availability.  TOC is also working on home health and increased support at home.  Therapy would be willing to train son on use of hoyer and other equipment.     Assessment/Plan:   Principal Problem:   Decubital ulcer Active Problems:   Falls frequently   Multiple sclerosis (HCC)   SIRS (systemic inflammatory response syndrome) (HCC)   Sacral decubitus ulcer, stage II (HCC)   Acute appendicitis   Bacteremia   MRSA bacteremia -Decubitus ulcer noted, but no other obvious source -Continue vancomycinfor total 4 weeks per ID; OPAT placed 1/18 -Repeat blood culturesnegative -2D echocardiogram TTE performed without any signs of vegetations. LVEF 60 to 65% -TEE performed on 1/17 with no vegetations -PICC line placed: 1/17.   IV antibiotics through 04/10/2020.  Right buttock decubitus ulcer -Appears to be mostly stage II with no need for debridement per general surgery -Continuediet -IV morphine as needed for pain control -Continue wound care  protocols -Air Mattress recommended  Questionable early appendicitis -Without significant abdominal pain/N/V at this time; no need for surgical intervention -Cipro and Flagyl started per GScontinue for total of 1 week, completed course on 03/22/2020.  Multiple sclerosis -Multiple falls which may be related -PT evaluation recommending SNF which patient is agreeable to -await placement vs home with increased support  Hypertension: Controlled. -Propranolol  Hypokalemia/Hypomagnesemia  -repleted    Family Communication/Anticipated D/C date and plan/Code Status   DVT prophylaxis: Lovenox ordered. Code Status: Full Code.  Disposition Plan: Status is: Inpatient  Remains inpatient appropriate because:unsafe d/c plan   Dispo: The patient is from: Home              Anticipated d/c is to: SNF  Vs home with increased support              Anticipated d/c date is: 1 day              Patient currently is medically stable to d/c.   Difficult to place patient Yes         Medical Consultants:    ID  GS  Subjective:   No current complaints  Objective:    Vitals:   03/26/20 0842 03/26/20 1433 03/26/20 2136 03/27/20 0548  BP: 139/84 109/67 138/80 121/71  Pulse: 66 67 69 69  Resp:   17 18  Temp:  97.6 F (36.4 C) 98.2 F (36.8 C) 98.2 F (36.8 C)  TempSrc:  Oral    SpO2:  100% 98% 97%  Weight:      Height:  Intake/Output Summary (Last 24 hours) at 03/27/2020 1153 Last data filed at 03/27/2020 0928 Gross per 24 hour  Intake 720 ml  Output 2050 ml  Net -1330 ml   Filed Weights   03/13/20 1000 03/13/20 1755  Weight: 45.8 kg 44 kg    Exam:  General: Appearance:    Thin female in no acute distress     Lungs:      respirations unlabored  Heart:    Normal heart rate. Normal rhythm. No murmurs, rubs, or gallops.   MS:   All extremities are intact.   Neurologic:   Awake, alert, oriented x 3. Pleasant and cooperative     Data Reviewed:   I  have personally reviewed following labs and imaging studies:  Labs: Labs show the following:   Basic Metabolic Panel: Recent Labs  Lab 03/23/20 0930 03/25/20 0551 03/26/20 0619  NA 136  --   --   K 3.9  --   --   CL 104  --   --   CO2 27  --   --   GLUCOSE 94  --   --   BUN 9  --   --   CREATININE 0.47  --  0.37*  CALCIUM 8.2*  --   --   MG 1.6* 1.8  --    GFR Estimated Creatinine Clearance: 49.3 mL/min (A) (by C-G formula based on SCr of 0.37 mg/dL (L)). Liver Function Tests: No results for input(s): AST, ALT, ALKPHOS, BILITOT, PROT, ALBUMIN in the last 168 hours. No results for input(s): LIPASE, AMYLASE in the last 168 hours. No results for input(s): AMMONIA in the last 168 hours. Coagulation profile No results for input(s): INR, PROTIME in the last 168 hours.  CBC: No results for input(s): WBC, NEUTROABS, HGB, HCT, MCV, PLT in the last 168 hours. Cardiac Enzymes: No results for input(s): CKTOTAL, CKMB, CKMBINDEX, TROPONINI in the last 168 hours. BNP (last 3 results) No results for input(s): PROBNP in the last 8760 hours. CBG: No results for input(s): GLUCAP in the last 168 hours. D-Dimer: No results for input(s): DDIMER in the last 72 hours. Hgb A1c: No results for input(s): HGBA1C in the last 72 hours. Lipid Profile: No results for input(s): CHOL, HDL, LDLCALC, TRIG, CHOLHDL, LDLDIRECT in the last 72 hours. Thyroid function studies: No results for input(s): TSH, T4TOTAL, T3FREE, THYROIDAB in the last 72 hours.  Invalid input(s): FREET3 Anemia work up: No results for input(s): VITAMINB12, FOLATE, FERRITIN, TIBC, IRON, RETICCTPCT in the last 72 hours. Sepsis Labs: No results for input(s): PROCALCITON, WBC, LATICACIDVEN in the last 168 hours.  Microbiology No results found for this or any previous visit (from the past 240 hour(s)).  Procedures and diagnostic studies:  No results found.  Medications:   . atorvastatin  20 mg Oral Daily  . baclofen  10 mg  Oral TID  . celecoxib  200 mg Oral BID  . Chlorhexidine Gluconate Cloth  6 each Topical Daily  . collagenase   Topical Daily  . enoxaparin (LOVENOX) injection  30 mg Subcutaneous Q24H  . gabapentin  600 mg Oral TID  . propranolol  20 mg Oral BID   Continuous Infusions: . vancomycin 1,000 mg (03/27/20 1034)     LOS: 14 days   Geradine Girt  [facility]   How to contact the [facility] Attending or Consulting provider [place] or covering provider during after hours [place], for this patient?  1. Check the care team in  [facility] and look for a) attending/consulting TRH provider listed and b) the [facility] team listed 2. Log into [email or web address] and use [place]'s universal password to access. If you do not have the password, please contact the hospital operator. 3. Locate the [facility] provider you are looking for under [facility] and page to a number that you can be directly reached. 4. If you still have difficulty reaching the provider, please page the [facility] (Director on Call) for the Hospitalists listed on amion for assistance.  03/27/2020, 11:53 AM

## 2020-03-27 NOTE — TOC Progression Note (Signed)
Transition of Care [facility]) - Progression Note    Patient Details  Name: Faith [name] MRN: [id number] Date of Birth: 02-08-1956  Transition of Care [facility]) CM/SW Contact  Salome Arnt, [place] Phone Number: 03/27/2020, 11:03 AM  Clinical Narrative:  LCSW spoke with pt to get more information about home situation. Pt states she has lived with her son and 64 year old grandson for the past 3 years. Her son is with her close to around the clock and is physically able to help her if needed. She states that if she falls, he is able to pick her up and get her to chair. Pt indicates she has spoken to her children about disposition and they both feel she needs rehab. Pt also agrees. However, pt was clear that her plan is for short-term rehab with return home with son as soon as possible. She has discussed difficulty finding placement with son and said she feels he knows it is a possibility that she may return home. She said her daughter lives in [place] and doesn't see pt very often. Pt reports she may come by about once a week to help her get ready for an appointment. She has not been a primary caregiver. Pt thinks she can manage at home with son and home health, in addition to aid. See previous note from [facility] indicating that pt can get increased PCS services up to 18.5 hours a week. Pt was active with [facility] prior to admission. If plan is to return home, PT is willing to work with pt and provide teaching to son prior to d/c.    [facility] is listed on facilities in network with pt's insurance. Caryl Pina, RN case manager for [employer] [phone]) will investigate this and update TOC. Per [place], they are not in network. SNF had offered bed to pt, but unable to take last week due to this.   MD updated. TOC will continue to work on disposition. Will await call from [facility] and then discuss further with pt/son.     Expected Discharge Plan: Skilled Nursing Facility Barriers to  Discharge: SNF Pending bed offer  Expected Discharge Plan and Services Expected Discharge Plan: [place] In-house Referral: Clinical Social Work   Post Acute Care Choice: [place] Living arrangements for the past 2 months: Single Family Home Expected Discharge Date: 03/21/20               DME Arranged: N/A                     Social Determinants of Health (SDOH) Interventions    Readmission Risk Interventions Readmission Risk Prevention Plan 03/19/2020  Medication Screening Complete  Transportation Screening Complete  Some recent data might be hidden

## 2020-03-27 NOTE — Progress Notes (Signed)
Physical Therapy Treatment Patient Details Name: Faith [name] MRN: [id number] DOB: February 08, 1956 Today's Date: 03/27/2020    History of Present Illness Faith [name] is a 65 y.o. female with medical history significant for multiple sclerosis, COPD, pressure ulcer, hypertension.  Patient was brought to the ED via EMS with complaints of fall, and family not being able to take care of her.  My evaluation patient is awake and alert and able to give me detailed history.  Her knees have just been giving way.  She ambulates with a motorized wheelchair and a walker.  She did not hit her head.  Patient was in the ED yesterday for fall and right shoulder pain, x-ray showed moderate osteoarthritic change she was discharged home.  Family are unable to take care of patient.  She has had a buttock ulcer for a while.  She reports pain from the area.  She would like to be discharged to rehab facility after this hospitalization.  She denies pain with urination, but she reports some urinary frequency, no chest pain, no cough, no difficulty breathing, no vomiting no loose stools no abdominal pain.    PT Comments    Patient had to support self with BUE while seated at bedside mostly to reduce sacral discomfort, demonstrates fair/good return for pushing down with BUE to limit body weight on bottom when moving to EOB.  Patient requiring increased time and frequent rest breaks to complete BLE exercises while seated at bedside and in chair, able to take a few side steps using RW before having to sit due to fatigue and poor standing balance.  Patient tolerated sitting up in chair after therapy - RN aware.  Patient will benefit from continued physical therapy in hospital and recommended venue below to increase strength, balance, endurance for safe ADLs and gait.   Follow Up Recommendations  SNF;Supervision for mobility/OOB;Supervision - Intermittent     Equipment Recommendations  None recommended by PT    Recommendations for  Other Services       Precautions / Restrictions Precautions Precautions: Fall Precaution Comments: sacral ulcer Restrictions Weight Bearing Restrictions: No    Mobility  Bed Mobility Overal bed mobility: Needs Assistance Bed Mobility: Supine to Sit       Sit to supine: Mod assist   General bed mobility comments: increased time, labored movement, good return for using BUE to reduce pressure over sacral area when moving to EOB  Transfers Overall transfer level: Needs assistance Equipment used: Rolling walker (2 wheeled) Transfers: Sit to/from [facility] Sit to Stand: Mod assist;Min assist Stand pivot transfers: Mod assist       General transfer comment: slow labored unsteady movement  Ambulation/Gait Ambulation/Gait assistance: Mod assist Gait Distance (Feet): 5 Feet Assistive device: Rolling walker (2 wheeled) Gait Pattern/deviations: Decreased step length - right;Decreased step length - left;Decreased stride length;Shuffle Gait velocity: decreased   General Gait Details: limited to 5-6 slow labored unsteady side steps at bedside using RW, limited mostly due to c/o fatigue, poor standing balance   Stairs             Wheelchair Mobility    Modified Rankin (Stroke Patients Only)       Balance Overall balance assessment: Needs assistance Sitting-balance support: Feet supported;Bilateral upper extremity supported Sitting balance-Leahy Scale: Fair Sitting balance - Comments: fair/good seated at EOB and in chair   Standing balance support: During functional activity;Bilateral upper extremity supported Standing balance-Leahy Scale: Poor Standing balance comment: fair/poor using RW  Cognition Arousal/Alertness: Awake/alert Behavior During Therapy: WFL for tasks assessed/performed Overall Cognitive Status: Within Functional Limits for tasks assessed                                         Exercises General Exercises - Lower Extremity Long Arc Quad: Seated;AROM;Strengthening;Both;10 reps Hip Flexion/Marching: Seated;AROM;Strengthening;Both;10 reps Toe Raises: Seated;AROM;Strengthening;Both;10 reps Heel Raises: Seated;AROM;Strengthening;10 reps;Both    General Comments        Pertinent Vitals/Pain Pain Assessment: Faces Faces Pain Scale: Hurts even more Pain Location: low back and sacral area Pain Descriptors / Indicators: Sore;Grimacing Pain Intervention(s): Limited activity within patient's tolerance;Monitored during session;Repositioned    Home Living                      Prior Function            PT Goals (current goals can now be found in the care plan section) Acute Rehab PT Goals Patient Stated Goal: get strong and return home PT Goal Formulation: With patient Time For Goal Achievement: 04/10/20 Potential to Achieve Goals: Good Progress towards PT goals: Progressing toward goals    Frequency    Min 3X/week      PT Plan Current plan remains appropriate    Co-evaluation              AM-PAC PT "6 Clicks" Mobility   Outcome Measure  Help needed turning from your back to your side while in a flat bed without using bedrails?: A Little Help needed moving from lying on your back to sitting on the side of a flat bed without using bedrails?: A Lot Help needed moving to and from a bed to a chair (including a wheelchair)?: A Lot Help needed standing up from a chair using your arms (e.g., wheelchair or bedside chair)?: A Lot Help needed to walk in hospital room?: A Lot Help needed climbing 3-5 steps with a railing? : Total 6 Click Score: 12    End of Session   Activity Tolerance: Patient tolerated treatment well;Patient limited by fatigue Patient left: in chair;with call bell/phone within reach Nurse Communication: Mobility status PT Visit Diagnosis: Unsteadiness on feet (R26.81);Muscle weakness (generalized) (M62.81);Other  abnormalities of gait and mobility (R26.89);Difficulty in walking, not elsewhere classified (R26.2);History of falling (Z91.81)     Time: 1010-1039 PT Time Calculation (min) (ACUTE ONLY): 29 min  Charges:  $Therapeutic Exercise: 8-22 mins $Therapeutic Activity: 8-22 mins                     12:36 PM, 03/27/20 Lonell Grandchild, MPT Physical Therapist with [facility] [phone] office [phone] mobile phone

## 2020-03-28 DIAGNOSIS — D72829 Elevated white blood cell count, unspecified: Secondary | ICD-10-CM | POA: Diagnosis not present

## 2020-03-28 DIAGNOSIS — L89152 Pressure ulcer of sacral region, stage 2: Secondary | ICD-10-CM | POA: Diagnosis not present

## 2020-03-28 DIAGNOSIS — R7881 Bacteremia: Secondary | ICD-10-CM | POA: Diagnosis not present

## 2020-03-28 LAB — VANCOMYCIN, TROUGH: Vancomycin Tr: 14 ug/mL — ABNORMAL LOW (ref 15–20)

## 2020-03-28 LAB — CREATININE, SERUM
Creatinine, Ser: 0.36 mg/dL — ABNORMAL LOW (ref 0.44–1.00)
GFR, Estimated: >60 mL/min (ref >60)

## 2020-03-28 MED ORDER — OCUVITE-LUTEIN PO CAPS
1.0000 | ORAL_CAPSULE | Freq: Every day | ORAL | Status: DC
  Administered 2020-03-28 – 2020-03-31 (×4): 1 via ORAL
  Filled 2020-03-28 (×4): qty 1

## 2020-03-28 MED ORDER — ENSURE ENLIVE PO LIQD
237.0000 mL | Freq: Two times a day (BID) | ORAL | Status: DC
  Administered 2020-03-28 – 2020-03-31 (×7): 237 mL via ORAL

## 2020-03-28 MED ORDER — PROPRANOLOL HCL 20 MG PO TABS
10.0000 mg | ORAL_TABLET | Freq: Two times a day (BID) | ORAL | Status: DC
  Administered 2020-03-28 – 2020-03-31 (×6): 10 mg via ORAL
  Filled 2020-03-28 (×6): qty 1

## 2020-03-28 NOTE — Progress Notes (Signed)
Progress Note    Faith [name]  [id number] DOB: 12-Sep-1955  DOA: 03/13/2020 PCP: Doree Albee, MD    Brief Narrative:     Medical records reviewed and are as summarized below:  Faith [name] is an 65 y.o. female with medical history significant formultiple sclerosis, COPD, pressure ulcer, hypertension. Patient was brought to the ED via EMS with complaints of fall, and family not being able to take care of her.Patient was admitted with SIRS criteria and decubitus ulcer with concern for need for debridement. She has had some worsening weakness and falls. She is now noted to have MRSA bacteremia.She hasbeen started on ciprofloxacin and Flagyl for empiric treatment of appendicitisper GS.Patient has undergone transesophageal echocardiogram on 1/17 with no findings of vegetations. ID recommends 4 weeks of IV antibiotics. PICC line placed 1/17. SNF placement being worked on but this may not be able to happen due to insurance/bed availability.  TOC is also working on home health and increased support at home.  Therapy would be willing to train son on use of hoyer and other equipment.     Assessment/Plan:   Principal Problem:   Decubital ulcer Active Problems:   Falls frequently   Multiple sclerosis (HCC)   SIRS (systemic inflammatory response syndrome) (HCC)   Sacral decubitus ulcer, stage II (HCC)   Acute appendicitis   Bacteremia   MRSA bacteremia -Decubitus ulcer noted, but no other obvious source -Continue vancomycinfor total 4 weeks per ID; OPAT placed 1/18 -Repeat blood culturesnegative -2D echocardiogram TTE performed without any signs of vegetations. LVEF 60 to 65% -TEE performed on 1/17 with no vegetations -PICC line placed: 1/17.   IV antibiotics through 04/10/2020.  Right buttock decubitus ulcer -Appears to be mostly stage II with no need for debridement per general surgery -Continuediet -IV morphine as needed for pain control -Continue wound care  protocols -[employer] recommended  Questionable early appendicitis -Without significant abdominal pain/N/V at this time; no need for surgical intervention -Cipro and Flagyl started per GScontinue for total of 1 week, completed course on 03/22/2020.  Multiple sclerosis -Multiple falls which may be related -PT evaluation recommending SNF which patient is agreeable to -await placement vs home with increased support- seems to be some difference in wants from patient and her family/daughter  Hypertension: Controlled. -Propranolol  Hypokalemia/Hypomagnesemia  -repleted    Family Communication/Anticipated D/C date and plan/Code Status   DVT prophylaxis: Lovenox ordered. Code Status: Full Code.  Disposition Plan: Status is: Inpatient Spoke with daughter at bedside, had to Faith [name] for son Remains inpatient appropriate because:unsafe d/c plan   Dispo: The patient is from: Home              Anticipated d/c is to: SNF  Vs home with increased support              Anticipated d/c date is: 1 day              Patient currently is medically stable to d/c.   Difficult to place patient Yes         Medical Consultants:    ID  GS  Subjective:   willing to go to rehab is bed available-- daughter states her brother is unable/unwilling to care for her mother  Objective:    Vitals:   03/27/20 1428 03/27/20 2211 03/28/20 1253 03/28/20 1414  BP: 123/76 135/74 108/79 111/72  Pulse: 69 71 96 70  Resp:  18  18  Temp: (!) 97.5 F (  36.4 C) 98 F (36.7 C)  98.1 F (36.7 C)  TempSrc: Oral Oral  Oral  SpO2: 98% 97%  99%  Weight:      Height:        Intake/Output Summary (Last 24 hours) at 03/28/2020 1416 Last data filed at 03/27/2020 2238 Gross per 24 hour  Intake 240 ml  Output 1600 ml  Net -1360 ml   Filed Weights   03/13/20 1000 03/13/20 1755  Weight: 45.8 kg 44 kg    Exam:  General: Appearance:    Thin female in no acute distress, sitting in chair     Lungs:      respirations unlabored  Heart:    Normal heart rate. Normal rhythm. No murmurs, rubs, or gallops.   MS:   All extremities are intact.   Neurologic:   Awake, alert       Data Reviewed:   I have personally reviewed following labs and imaging studies:  Labs: Labs show the following:   Basic Metabolic Panel: Recent Labs  Lab 03/23/20 0930 03/25/20 0551 03/26/20 0619 03/28/20 0512  NA 136  --   --   --   K 3.9  --   --   --   CL 104  --   --   --   CO2 27  --   --   --   GLUCOSE 94  --   --   --   BUN 9  --   --   --   CREATININE 0.47  --  0.37* 0.36*  CALCIUM 8.2*  --   --   --   MG 1.6* 1.8  --   --    GFR Estimated Creatinine Clearance: 49.3 mL/min (A) (by C-G formula based on SCr of 0.36 mg/dL (L)). Liver Function Tests: No results for input(s): AST, ALT, ALKPHOS, BILITOT, PROT, ALBUMIN in the last 168 hours. No results for input(s): LIPASE, AMYLASE in the last 168 hours. No results for input(s): AMMONIA in the last 168 hours. Coagulation profile No results for input(s): INR, PROTIME in the last 168 hours.  CBC: No results for input(s): WBC, NEUTROABS, HGB, HCT, MCV, PLT in the last 168 hours. Cardiac Enzymes: No results for input(s): CKTOTAL, CKMB, CKMBINDEX, TROPONINI in the last 168 hours. BNP (last 3 results) No results for input(s): PROBNP in the last 8760 hours. CBG: No results for input(s): GLUCAP in the last 168 hours. D-Dimer: No results for input(s): DDIMER in the last 72 hours. Hgb A1c: No results for input(s): HGBA1C in the last 72 hours. Lipid Profile: No results for input(s): CHOL, HDL, LDLCALC, TRIG, CHOLHDL, LDLDIRECT in the last 72 hours. Thyroid function studies: No results for input(s): TSH, T4TOTAL, T3FREE, THYROIDAB in the last 72 hours.  Invalid input(s): FREET3 Anemia work up: No results for input(s): VITAMINB12, FOLATE, FERRITIN, TIBC, IRON, RETICCTPCT in the last 72 hours. Sepsis Labs: No results for input(s): PROCALCITON, WBC,  LATICACIDVEN in the last 168 hours.  Microbiology No results found for this or any previous visit (from the past 240 hour(s)).  Procedures and diagnostic studies:  No results found.  Medications:    atorvastatin  20 mg Oral Daily   baclofen  10 mg Oral TID   celecoxib  200 mg Oral BID   Chlorhexidine Gluconate Cloth  6 each Topical Daily   collagenase   Topical Daily   enoxaparin (LOVENOX) injection  30 mg Subcutaneous Q24H   feeding supplement  237 mL Oral BID BM  gabapentin  600 mg Oral TID   multivitamin-lutein  1 capsule Oral Daily   propranolol  20 mg Oral BID   Continuous Infusions:  vancomycin 1,000 mg (03/28/20 1037)     LOS: 15 days   Faith [name]  [facility]   How to contact the [name] Attending or Consulting provider [place] or covering provider during after hours [place], for this patient?  1. Check the care team in [name] and look for a) attending/consulting TR[facility] provider listed and b) the [facility] team listed 2. Log into [email or web address] and use [place]'s universal password to access. If you do not have the password, please contact the hospital operator. 3. Locate the [name] provider you are looking for under [facility] and page to a number that you can be directly reached. 4. If you still have difficulty reaching the provider, please page the [facility] (Director on Call) for the Hospitalists listed on amion for assistance.  03/28/2020, 2:16 PM

## 2020-03-28 NOTE — Progress Notes (Signed)
Physical Therapy Treatment Patient Details Name: Faith [name] MRN: [id number] DOB: March 28, 1955 Today's Date: 03/28/2020    History of Present Illness Faith [name] is a 65 y.o. female with medical history significant for multiple sclerosis, COPD, pressure ulcer, hypertension.  Patient was brought to the ED via EMS with complaints of fall, and family not being able to take care of her.  My evaluation patient is awake and alert and able to give me detailed history.  Her knees have just been giving way.  She ambulates with a motorized wheelchair and a walker.  She did not hit her head.  Patient was in the ED yesterday for fall and right shoulder pain, x-ray showed moderate osteoarthritic change she was discharged home.  Family are unable to take care of patient.  She has had a buttock ulcer for a while.  She reports pain from the area.  She would like to be discharged to rehab facility after this hospitalization.  She denies pain with urination, but she reports some urinary frequency, no chest pain, no cough, no difficulty breathing, no vomiting no loose stools no abdominal pain.    PT Comments    Pt friendly and willing to participate with therapy today, daughter in room during session.  Pt limited by weakness and fatigue with activities.  Pt educated on proper bed mobility with log rolling for pain control and mobility.  Pt c/o dizziness upon sitting on EOB that lasted through session, 4 minutes for severity to reduce to mild dizziness.  Vitals assessed.  Mod assistance with transfer training and cueing for proper hand use to assist.  Mod assistance with gait, pt able to sidestep front of bed to chair with slow cadence and poor standing balance.  Pt transferred to [facility], upon standing knees locked and unable to move required SPT back to chair due to fatigue and weakness.  EOS pt left in chair with call bell within reach and daughter present in room.   Follow Up Recommendations  SNF;Supervision for  mobility/OOB;Supervision - Intermittent     Equipment Recommendations  None recommended by PT    Recommendations for Other Services       Precautions / Restrictions Precautions Precautions: Fall Precaution Comments: sacral ulcer    Mobility  Bed Mobility Overal bed mobility: Needs Assistance Bed Mobility: Supine to Sit Rolling: Min guard;Min assist   Supine to sit: Min assist;Mod assist     General bed mobility comments: labored movements, increased time, cueing for log rolling for pain control and mobility.  Good return for using BUE to reduce pressure over sacral area when moving to EOB  Transfers Overall transfer level: Needs assistance Equipment used: Rolling walker (2 wheeled) Transfers: Sit to/from [facility] Sit to Stand: Min assist;Mod assist Stand pivot transfers: Mod assist       General transfer comment: slow labored unsteady movement, cueing for hand placement to assist with standing.  Required SPT from [facility] to chair due to knee locking up and fatigue  Ambulation/Gait Ambulation/Gait assistance: Mod assist Gait Distance (Feet): 7 Feet Assistive device: Rolling walker (2 wheeled) Gait Pattern/deviations: Decreased step length - right;Decreased step length - left;Decreased stride length;Shuffle Gait velocity: decreased   General Gait Details: limited to sidestep at bedside using RW to chair.  SPT from BCS back to chair.  Limited due to fatigue and weakness, poor standing balance.   Stairs             Wheelchair Mobility    Modified  Rankin (Stroke Patients Only)       Balance                                            Cognition Arousal/Alertness: Awake/alert Behavior During Therapy: WFL for tasks assessed/performed Overall Cognitive Status: Within Functional Limits for tasks assessed                                        Exercises General Exercises - Lower Extremity Ankle  Circles/Pumps: Strengthening;Both;20 reps;Seated Long Arc Quad: Seated;AROM;Strengthening;Both;10 reps Hip Flexion/Marching: Seated;AROM;Strengthening;Both;10 reps Toe Raises: Seated;AROM;Strengthening;Both;10 reps Heel Raises: Seated;AROM;Strengthening;10 reps;Both    General Comments        Pertinent Vitals/Pain Pain Assessment: 0-10 Pain Score: 5  Pain Location: low back and sacral area Pain Descriptors / Indicators: Sore;Grimacing Pain Intervention(s): Monitored during session;Repositioned;Limited activity within patient's tolerance    Home Living                      Prior Function            PT Goals (current goals can now be found in the care plan section)      Frequency    Min 3X/week      PT Plan Current plan remains appropriate    Co-evaluation              AM-PAC PT "6 Clicks" Mobility   Outcome Measure  Help needed turning from your back to your side while in a flat bed without using bedrails?: A Little Help needed moving from lying on your back to sitting on the side of a flat bed without using bedrails?: A Lot Help needed moving to and from a bed to a chair (including a wheelchair)?: A Lot Help needed standing up from a chair using your arms (e.g., wheelchair or bedside chair)?: A Lot Help needed to walk in hospital room?: A Lot Help needed climbing 3-5 steps with a railing? : Total 6 Click Score: 12    End of Session Equipment Utilized During Treatment: Gait belt Activity Tolerance: Patient tolerated treatment well;Patient limited by fatigue Patient left: in chair;with call bell/phone within reach;with family/visitor present Nurse Communication: Mobility status PT Visit Diagnosis: Unsteadiness on feet (R26.81);Muscle weakness (generalized) (M62.81);Other abnormalities of gait and mobility (R26.89);Difficulty in walking, not elsewhere classified (R26.2);History of falling (Z91.81)     Time: 5170-0174 PT Time Calculation (min)  (ACUTE ONLY): 38 min  Charges:  $Therapeutic Exercise: 8-22 mins $Therapeutic Activity: 23-37 mins                     Ihor Austin, LPTA/CLT; CBIS [phone]   Aldona Lento 03/28/2020, 1:01 PM

## 2020-03-28 NOTE — Plan of Care (Signed)

## 2020-03-28 NOTE — Care Management (Signed)
Writer spoke with Caryl Pina at [facility] along with provider customer service rep who confirmed that Faith [name] is not an in network facility with patient's plan.

## 2020-03-28 NOTE — Progress Notes (Addendum)
Pharmacy Antibiotic Note  Today is day #10 of vancomycin therapy for this 65 yo female with bacteremia and intra-abdominal infection.    Vancomycin trough of 41mcg/mL yesterday was just slightly below goal range, but would expect accumulation of vancomycin eventually. With Scr of 0.36mg /dL  and her cachetic build, her creatinine clearance is likely an over-estimate of her real clearance. Will decrease dose going forward to prevent possible accumulation and adjust OPAT orders accordingly.   Plan: Change vancomycin to 500mg  IV q12h Monitor labs, c/s, and further antibiotics.    Intake/Output Summary (Last 24 hours) at 03/28/2020 1056 Last data filed at 03/27/2020 2238 Gross per 24 hour  Intake 480 ml  Output 2500 ml  Net -2020 ml      Height: 5\' 4"  (162.6 cm) Weight: 44 kg (97 lb) IBW/kg (Calculated) : 54.7  Temp (24hrs), Avg:97.8 F (36.6 C), Min:97.5 F (36.4 C), Max:98 F (36.7 C)  Recent Labs  Lab 03/23/20 0930 03/25/20 0935 03/26/20 0619 03/28/20 0512 03/28/20 0839  CREATININE 0.47  --  0.37* 0.36*  --   VANCOTROUGH  --  8*  --   --  14*    Estimated Creatinine Clearance: 49.3 mL/min (A) (by C-G formula based on SCr of 0.36 mg/dL (L)).     Antimicrobials this admission: Vanco 1/13 >>  Cefepime 1/13 x 1 Flagyl 1/13>> cipro 1/15 >>  Rocephin 1/13>> 1/14  Microbiology results: 1/13 BCx: MRSA 1/14 BCx2:   ngf 1/13 UCx: multiple species suggest recollection 1/13 Covid/flu: negative  Thank you for allowing pharmacy to be a part of this patient's care.  Despina Pole, Pharm. D. Clinical Pharmacist 03/28/2020 10:58 AM

## 2020-03-28 NOTE — Progress Notes (Addendum)
Initial Nutrition Assessment  DOCUMENTATION CODES:   Non-severe (moderate) malnutrition in context of chronic illness,Underweight  INTERVENTION:  [employer] instant breakfast daily   Ensure Enlive po BID- between meals each supplement provides 350 kcal and 20 grams of protein   Encouraged consistent intake to include protein source at each meal   Recommend liberalize diet to regular  NUTRITION DIAGNOSIS:   Moderate Malnutrition related to chronic inflammatory illness (MS)- as evidenced by mild fat depletion,moderate fat depletion,mild muscle depletion,moderate muscle depletion, underweight.  GOAL:  Patient will meet greater than or equal to 90% of their needs  MONITOR:  PO intake,Supplement acceptance,Labs,Skin,Weight trends  REASON FOR ASSESSMENT:   LOS,Consult Assessment of nutrition requirement/status  ASSESSMENT:  Faith [name] is an underweight 65 y.o. female with medical history of MS, cervical spinal stenosis with myelopathy s/p laminectomy in 2020, COPD, hypertension, hyperlipidemia, tobacco use. Decubital ulcer stage II. Bilateral lower extremity weakness.  1/17-PICC placed . Patient had TEE to rule out endocarditis in a pt with MRSA bacteremia per MD note.  Patient has variable meal intake 30-100%. LOS-day 15. Able to feed herself. Usual diet is regular. She is not a breakfast eater- frequently consumes [employer] breakfast. An aid prepares her lunch and her son provides dinner. She likes chocolate and strawberry Ensure and supplements with those occasionally.   Medications: reviewed.  IV-vancomycin- 4 weeks  Labs reviewed: mag 1.8 wnl, Cr 0.36 (L).  Weight range 44-46 kg the past 6 months.   NUTRITION - FOCUSED PHYSICAL EXAM:  Flowsheet Row Most Recent Value  Orbital Region Mild depletion  Upper Arm Region Severe depletion  Thoracic and Lumbar Region Moderate depletion  Temple Region Mild depletion  Clavicle Bone Region Severe depletion  Clavicle  and Acromion Bone Region Severe depletion  Scapular Bone Region Unable to assess  Dorsal Hand Moderate depletion  Patellar Region Unable to assess  Anterior Thigh Region Unable to assess  Posterior Calf Region Unable to assess  Edema (RD Assessment) None  Hair Reviewed  Eyes Reviewed  Mouth Reviewed  Skin Reviewed  Nails Reviewed      Diet Order:   Diet Order            Diet Heart Room service appropriate? Yes; Fluid consistency: Thin  Diet effective now                 EDUCATION NEEDS:  Education needs have been addressed  Skin:  Skin Assessment: Reviewed RN Assessment  Last BM:  1/28  Height:   Ht Readings from Last 1 Encounters:  03/13/20 5\' 4"  (1.626 m)    Weight:   Wt Readings from Last 1 Encounters:  03/13/20 44 kg    Ideal Body Weight:   55 kg  BMI:  Body mass index is 16.65 kg/m.  Estimated Nutritional Needs:   Kcal:  3536-1443  Protein:  66-79 gr  Fluid:  >1300 ml daily   Colman Cater MS,RD,CSG,LDN Pager: Shea Evans

## 2020-03-28 NOTE — TOC Progression Note (Addendum)
Transition of Care [facility]) - Progression Note   Patient Details  Name: Faith [name] MRN: [id number] Date of Birth: Dec 16, 1955  Transition of Care [facility]) CM/SW [place], LCSW Phone Number: 03/28/2020, 3:19 PM  Clinical Narrative: CSW updated by RN that patient's daughter, Sharyne Richters, was requesting to meet with CSW regarding LTC for patient. CSW spoke with patient in room, but daughter had left by then. CSW asked patient if she wanted LTC and patient stated she does not and would still prefer to return home with her son after SNF. CSW asked patient if her son is agreeable to having her come back home and if she feels safe returning home. Patient reported she has confirmed with her son several times she can return home and feels safe living with him. CSW discussed with patient that several facilities provided by the [facility] caseworker are not in-network with the Medicaid managed plans.  [place]: spoke with Antony Madura, the facility does not accept managed Medicaid [facility]: not in-network [facility]: spoke with Olivia Mackie, not in-network [facility]: care exceeds what they can provide [facility]: left voicemail [facility]: left [employer] [place]: not currently accepting Medicaid for SNF due to payment issues [facility]: not in-network [place]: not accepting new patients  CSW reviewed patient's DME needs. Per patient, she will likely need a Hoyer lift and is agreeable to referral to [employer]. Patient already has manual and electric wheelchairs and a hospital bed, although patient reported she mainly sleeps in her recliner. CSW discussed OP palliative services given the chronic nature of MS. Patient is agreeable to OP palliative through [facility]. CSW explained that TOC is still reaching out to SNFs, but needs to make sure a safe back-up plan is in place in case one cannot be found. Patient reported she should be okay at home as her Medicaid  caseworker is willing to increase her PCS hours and [employer] can resume [place] services. Hospitalist and RN updated. Referral for [facility] lift made to [place] with [employer].  CSW spoke with patient's son, [name]. Son agreeable to patient returning home if SNF placement is not likely. TOC to follow.  Expected Discharge Plan: Skilled Nursing Facility Barriers to Discharge: SNF Pending bed offer  Expected Discharge Plan and Services Expected Discharge Plan: [place] In-house Referral: Clinical Social Work Post Acute Care Choice: [place] Living arrangements for the past 2 months: [place] Expected Discharge Date: 03/21/20               DME Arranged: N/A  Readmission Risk Interventions Readmission Risk Prevention Plan 03/19/2020  Medication Screening Complete  Transportation Screening Complete  Some recent data might be hidden

## 2020-03-29 DIAGNOSIS — R296 Repeated falls: Secondary | ICD-10-CM | POA: Diagnosis not present

## 2020-03-29 DIAGNOSIS — L89152 Pressure ulcer of sacral region, stage 2: Secondary | ICD-10-CM | POA: Diagnosis not present

## 2020-03-29 DIAGNOSIS — R7881 Bacteremia: Secondary | ICD-10-CM | POA: Diagnosis not present

## 2020-03-29 DIAGNOSIS — E440 Moderate protein-calorie malnutrition: Secondary | ICD-10-CM | POA: Insufficient documentation

## 2020-03-29 MED ORDER — VANCOMYCIN HCL 500 MG/100ML IV SOLN
500.0000 mg | Freq: Two times a day (BID) | INTRAVENOUS | Status: DC
  Administered 2020-03-29 – 2020-03-31 (×4): 500 mg via INTRAVENOUS
  Filled 2020-03-29 (×7): qty 100

## 2020-03-29 NOTE — Progress Notes (Signed)
Progress Note    Faith [name]  CVE:[id number] DOB: [birth year]  DOA: 03/13/2020 PCP: Doree Albee, MD    Brief Narrative:     Medical records reviewed and are as summarized below:  Faith [name] is an 65 y.o. female with medical history significant formultiple sclerosis, COPD, pressure ulcer, hypertension. Patient was brought to the ED via EMS with complaints of fall, and family not being able to take care of her.Patient was admitted with SIRS criteria and decubitus ulcer with concern for need for debridement. She has had some worsening weakness and falls. She is now noted to have MRSA bacteremia.She hasbeen started on ciprofloxacin and Flagyl for empiric treatment of appendicitisper GS.Patient has undergone transesophageal echocardiogram on 1/17 with no findings of vegetations. ID recommends 4 weeks of IV antibiotics. PICC line placed 1/17. SNF placement being worked on but this may not be able to happen due to insurance/bed availability.  TOC is also working on home health and increased support at home.  Therapy would be willing to train son on use of hoyer and other equipment.     Assessment/Plan:   Principal Problem:   Decubital ulcer Active Problems:   Falls frequently   Multiple sclerosis (HCC)   SIRS (systemic inflammatory response syndrome) (HCC)   Sacral decubitus ulcer, stage II (HCC)   Acute appendicitis   Bacteremia   Malnutrition of moderate degree   MRSA bacteremia -Decubitus ulcer noted, but no other obvious source -Continue vancomycinfor total 4 weeks per ID; OPAT placed 1/18 -Repeat blood culturesnegative -2D echocardiogram TTE performed without any signs of vegetations. LVEF 60 to 65% -TEE performed on 1/17 with no vegetations -PICC line placed: 1/17.   IV antibiotics through 04/10/2020.  Right buttock decubitus ulcer -Appears to be mostly stage II with no need for debridement per general surgery -Continuediet -IV morphine as needed for  pain control -Continue wound care protocols -Air Mattress recommended  Questionable early appendicitis -Without significant abdominal pain/N/V at this time; no need for surgical intervention -Cipro and Flagyl started per GScontinue for total of 1 week, completed course on 03/22/2020.  Multiple sclerosis -Multiple falls which may be related -PT evaluation recommending SNF which patient is agreeable to -await placement vs home with increased support- TOC has been able to reach son he is agreeable with the plan  Hypertension: Controlled. -Propranolol- lower dose  Hypokalemia/Hypomagnesemia  -repleted  Nutrition Status: Nutrition Problem: Moderate Malnutrition Etiology: chronic illness (MS) Signs/Symptoms: mild fat depletion,moderate fat depletion,mild muscle depletion,moderate muscle depletion Interventions: Ensure Enlive (each supplement provides 350kcal and 20 grams of protein),Magic cup,MVI,Liberalize Diet,Education   Plan for outpatient palliative care referral   Family Communication/Anticipated D/C date and plan/Code Status   DVT prophylaxis: Lovenox ordered. Code Status: Full Code.  Disposition Plan: Status is: Inpatient Spoke with daughter at bedside, had to [facility] for son 1/28 Remains inpatient appropriate because:unsafe d/c plan   Dispo: The patient is from: Home              Anticipated d/c is to: SNF  Vs home with increased support              Anticipated d/c date is: 1 day              Patient currently is medically stable to d/c.   Difficult to place patient Yes         Medical Consultants:    ID  GS  Subjective:   patient ok with going home but  wants to make sure support is in place   Objective:    Vitals:   03/28/20 1414 03/28/20 2048 03/29/20 0537 03/29/20 1245  BP: 111/72 107/69 111/68 102/81  Pulse: 70 74 69 70  Resp: 18 16 18 18   Temp: 98.1 F (36.7 C) (!) 97.5 F (36.4 C) 98.7 F (37.1 C) 98.2 F (36.8 C)  TempSrc: Oral Oral  Oral Oral  SpO2: 99% 97% 96% 99%  Weight:      Height:        Intake/Output Summary (Last 24 hours) at 03/29/2020 1329 Last data filed at 03/28/2020 1736 Gross per 24 hour  Intake 240 ml  Output 1300 ml  Net -1060 ml   Filed Weights   03/13/20 1000 03/13/20 1755  Weight: 45.8 kg 44 kg    Exam:  General: Appearance:    Thin female in no acute distress     Lungs:      respirations unlabored  Heart:    Normal heart rate. Normal rhythm. No murmurs, rubs, or gallops.   MS:   All extremities are intact.   Neurologic:   Awake, alert, oriented x 3.     Data Reviewed:   I have personally reviewed following labs and imaging studies:  Labs: Labs show the following:   Basic Metabolic Panel: Recent Labs  Lab 03/23/20 0930 03/25/20 0551 03/26/20 0619 03/28/20 0512  NA 136  --   --   --   K 3.9  --   --   --   CL 104  --   --   --   CO2 27  --   --   --   GLUCOSE 94  --   --   --   BUN 9  --   --   --   CREATININE 0.47  --  0.37* 0.36*  CALCIUM 8.2*  --   --   --   MG 1.6* 1.8  --   --    GFR Estimated Creatinine Clearance: 49.3 mL/min (A) (by C-G formula based on SCr of 0.36 mg/dL (L)). Liver Function Tests: No results for input(s): AST, ALT, ALKPHOS, BILITOT, PROT, ALBUMIN in the last 168 hours. No results for input(s): LIPASE, AMYLASE in the last 168 hours. No results for input(s): AMMONIA in the last 168 hours. Coagulation profile No results for input(s): INR, PROTIME in the last 168 hours.  CBC: No results for input(s): WBC, NEUTROABS, HGB, HCT, MCV, PLT in the last 168 hours. Cardiac Enzymes: No results for input(s): CKTOTAL, CKMB, CKMBINDEX, TROPONINI in the last 168 hours. BNP (last 3 results) No results for input(s): PROBNP in the last 8760 hours. CBG: No results for input(s): GLUCAP in the last 168 hours. D-Dimer: No results for input(s): DDIMER in the last 72 hours. Hgb A1c: No results for input(s): HGBA1C in the last 72 hours. Lipid Profile: No  results for input(s): CHOL, HDL, LDLCALC, TRIG, CHOLHDL, LDLDIRECT in the last 72 hours. Thyroid function studies: No results for input(s): TSH, T4TOTAL, T3FREE, THYROIDAB in the last 72 hours.  Invalid input(s): FREET3 Anemia work up: No results for input(s): VITAMINB12, FOLATE, FERRITIN, TIBC, IRON, RETICCTPCT in the last 72 hours. Sepsis Labs: No results for input(s): PROCALCITON, WBC, LATICACIDVEN in the last 168 hours.  Microbiology No results found for this or any previous visit (from the past 240 hour(s)).  Procedures and diagnostic studies:  No results found.  Medications:   . atorvastatin  20 mg Oral Daily  . baclofen  10 mg Oral TID  . celecoxib  200 mg Oral BID  . Chlorhexidine Gluconate Cloth  6 each Topical Daily  . collagenase   Topical Daily  . enoxaparin (LOVENOX) injection  30 mg Subcutaneous Q24H  . feeding supplement  237 mL Oral BID BM  . gabapentin  600 mg Oral TID  . multivitamin-lutein  1 capsule Oral Daily  . propranolol  10 mg Oral BID   Continuous Infusions: . vancomycin       LOS: 16 days   Geradine Girt  [facility]   How to contact the [facility] Attending or Consulting provider [place] or covering provider during after hours [place], for this patient?  1. Check the care team in [facility] and look for a) attending/consulting TRH provider listed and b) the [facility] team listed 2. Log into [email or web address] and use [place]'s universal password to access. If you do not have the password, please contact the hospital operator. 3. Locate the [facility] provider you are looking for under [facility] and page to a number that you can be directly reached. 4. If you still have difficulty reaching the provider, please page the [facility] (Director on Call) for the Hospitalists listed on amion for assistance.  03/29/2020, 1:29 PM

## 2020-03-29 NOTE — Plan of Care (Signed)

## 2020-03-30 DIAGNOSIS — E440 Moderate protein-calorie malnutrition: Secondary | ICD-10-CM

## 2020-03-30 DIAGNOSIS — R7881 Bacteremia: Secondary | ICD-10-CM | POA: Diagnosis not present

## 2020-03-30 DIAGNOSIS — L89152 Pressure ulcer of sacral region, stage 2: Secondary | ICD-10-CM | POA: Diagnosis not present

## 2020-03-30 NOTE — Progress Notes (Signed)
Progress Note    Faith [name]  UYQ:[id number] DOB: [birth year]  DOA: 03/13/2020 PCP: Doree Albee, MD    Brief Narrative:     Medical records reviewed and are as summarized below:  Faith [name] is an 65 y.o. female with medical history significant formultiple sclerosis, COPD, pressure ulcer, hypertension. Patient was brought to the ED via EMS with complaints of fall, and family not being able to take care of her.Patient was admitted with SIRS criteria and decubitus ulcer with concern for need for debridement. She has had some worsening weakness and falls. She is now noted to have MRSA bacteremia.She hasbeen started on ciprofloxacin and Flagyl for empiric treatment of appendicitisper GS.Patient has undergone transesophageal echocardiogram on 1/17 with no findings of vegetations. ID recommends 4 weeks of IV antibiotics. PICC line placed 1/17. SNF placement being worked on but this may not be able to happen due to insurance/bed availability.  TOC is also working on home health and increased support at home.  Therapy would be willing to train son on use of hoyer and other equipment.     Assessment/Plan:   Principal Problem:   Decubital ulcer Active Problems:   Falls frequently   Multiple sclerosis (HCC)   SIRS (systemic inflammatory response syndrome) (HCC)   Sacral decubitus ulcer, stage II (HCC)   Acute appendicitis   Bacteremia   Malnutrition of moderate degree   MRSA bacteremia -Decubitus ulcer noted, but no other obvious source -Continue vancomycinfor total 4 weeks per ID; OPAT placed 1/18 -Repeat blood culturesnegative -2D echocardiogram TTE performed without any signs of vegetations. LVEF 60 to 65% -TEE performed on 1/17 with no vegetations -PICC line placed: 1/17.   IV antibiotics through 04/10/2020.  Right buttock decubitus ulcer -Appears to be mostly stage II with no need for debridement per general surgery -Continuediet -IV morphine as needed for  pain control -Continue wound care protocols -Air Mattress recommended  Questionable early appendicitis -Without significant abdominal pain/N/V at this time; no need for surgical intervention -Cipro and Flagyl started per GScontinue for total of 1 week, completed course on 03/22/2020.  Multiple sclerosis -Multiple falls which may be related -PT evaluation recommending SNF which patient is agreeable to -await placement vs home with increased support- TOC has been able to reach son he is agreeable with the plan  Hypertension: Controlled. -Propranolol- lower dose  Hypokalemia/Hypomagnesemia  -repleted  Nutrition Status: Nutrition Problem: Moderate Malnutrition Etiology: chronic illness (MS) Signs/Symptoms: mild fat depletion,moderate fat depletion,mild muscle depletion,moderate muscle depletion Interventions: Ensure Enlive (each supplement provides 350kcal and 20 grams of protein),Magic cup,MVI,Liberalize Diet,Education   Plan for outpatient palliative care referral   Family Communication/Anticipated D/C date and plan/Code Status   DVT prophylaxis: Lovenox ordered. Code Status: Full Code.  Disposition Plan: Status is: Inpatient Spoke with daughter at bedside, had to [facility] for son 1/28 Remains inpatient appropriate because:unsafe d/c plan: need confirmation of home health dates/availability   Dispo: The patient is from: Home              Anticipated d/c is to: SNF  Vs home with increased support              Anticipated d/c date is: 1 day              Patient currently is medically stable to d/c.   Difficult to place patient Yes         Medical Consultants:    ID  GS  Subjective:  No overnight events   Objective:    Vitals:   03/29/20 1245 03/29/20 2117 03/30/20 0558 03/30/20 0934  BP: 102/81 128/82 114/74 127/81  Pulse: 70 71 72 70  Resp: 18 18 16    Temp: 98.2 F (36.8 C) 98 F (36.7 C) 97.9 F (36.6 C)   TempSrc: Oral     SpO2: 99% 100% 98%    Weight:      Height:        Intake/Output Summary (Last 24 hours) at 03/30/2020 1309 Last data filed at 03/30/2020 1153 Gross per 24 hour  Intake -  Output 2420 ml  Net -2420 ml   Filed Weights   03/13/20 1000 03/13/20 1755  Weight: 45.8 kg 44 kg    Exam:  General: Appearance:    Thin female in no acute distress     Lungs:     respirations unlabored  Heart:    Normal heart rate. Normal rhythm. No murmurs, rubs, or gallops.   MS:   All extremities are intact.   Neurologic:   Awake, alert, oriented x 3.       Data Reviewed:   I have personally reviewed following labs and imaging studies:  Labs: Labs show the following:   Basic Metabolic Panel: Recent Labs  Lab 03/25/20 0551 03/26/20 0619 03/28/20 0512  CREATININE  --  0.37* 0.36*  MG 1.8  --   --    GFR Estimated Creatinine Clearance: 49.3 mL/min (A) (by C-G formula based on SCr of 0.36 mg/dL (L)). Liver Function Tests: No results for input(s): AST, ALT, ALKPHOS, BILITOT, PROT, ALBUMIN in the last 168 hours. No results for input(s): LIPASE, AMYLASE in the last 168 hours. No results for input(s): AMMONIA in the last 168 hours. Coagulation profile No results for input(s): INR, PROTIME in the last 168 hours.  CBC: No results for input(s): WBC, NEUTROABS, HGB, HCT, MCV, PLT in the last 168 hours. Cardiac Enzymes: No results for input(s): CKTOTAL, CKMB, CKMBINDEX, TROPONINI in the last 168 hours. BNP (last 3 results) No results for input(s): PROBNP in the last 8760 hours. CBG: No results for input(s): GLUCAP in the last 168 hours. D-Dimer: No results for input(s): DDIMER in the last 72 hours. Hgb A1c: No results for input(s): HGBA1C in the last 72 hours. Lipid Profile: No results for input(s): CHOL, HDL, LDLCALC, TRIG, CHOLHDL, LDLDIRECT in the last 72 hours. Thyroid function studies: No results for input(s): TSH, T4TOTAL, T3FREE, THYROIDAB in the last 72 hours.  Invalid input(s): FREET3 Anemia work  up: No results for input(s): VITAMINB12, FOLATE, FERRITIN, TIBC, IRON, RETICCTPCT in the last 72 hours. Sepsis Labs: No results for input(s): PROCALCITON, WBC, LATICACIDVEN in the last 168 hours.  Microbiology No results found for this or any previous visit (from the past 240 hour(s)).  Procedures and diagnostic studies:  No results found.  Medications:   . atorvastatin  20 mg Oral Daily  . baclofen  10 mg Oral TID  . celecoxib  200 mg Oral BID  . Chlorhexidine Gluconate Cloth  6 each Topical Daily  . collagenase   Topical Daily  . enoxaparin (LOVENOX) injection  30 mg Subcutaneous Q24H  . feeding supplement  237 mL Oral BID BM  . gabapentin  600 mg Oral TID  . multivitamin-lutein  1 capsule Oral Daily  . propranolol  10 mg Oral BID   Continuous Infusions: . vancomycin 500 mg (03/30/20 0200)     LOS: 17 days   Geradine Girt  [facility]   How to contact the [facility] Attending or Consulting provider [place] or covering provider during after hours [place], for this patient?  1. Check the care team in [facility] and look for a) attending/consulting TRH provider listed and b) the [facility] team listed 2. Log into [email or web address] and use [place]'s universal password to access. If you do not have the password, please contact the hospital operator. 3. Locate the [facility] provider you are looking for under [facility] and page to a number that you can be directly reached. 4. If you still have difficulty reaching the provider, please page the [facility] (Director on Call) for the Hospitalists listed on amion for assistance.  03/30/2020, 1:09 PM

## 2020-03-30 NOTE — TOC Progression Note (Signed)
Transition of Care [facility]) - Progression Note    Patient Details  Name: Faith [name] MRN: [id number] Date of Birth: 11/14/1955  Transition of [facility]) CM/SW Contact  Shade Flood, LCSW Phone Number: 03/30/2020, 11:15 AM  Clinical Narrative:     TOC following. Pt status reviewed with MD in Progression today. Per MD, pt can dc when there is confirmation that the [facility] is at the home and also once [facility] visits are verified as MD states family expressed concern about any possible delay in [facility] visits being an issue.   Assigned TOC will follow up in AM to verify DME, IV anbx therapy, and [place] visits.  Expected Discharge Plan: [place] Barriers to Discharge: No SNF bed,SNF Pending bed offer  Expected Discharge Plan and Services Expected Discharge Plan: [place] In-house Referral: Clinical Social Work   Post Acute Care Choice: [place] Living arrangements for the past 2 months: [place] Expected Discharge Date: 03/21/20               DME Arranged: Other see comment [facility]) DME Agency: [employer] Date DME Agency Contacted: 03/28/20   Representative spoke with at DME Agency: [place] ([place]) Interventions    Readmission Risk Interventions Readmission Risk Prevention Plan 03/19/2020  Medication Screening Complete  Transportation Screening Complete  Some recent data might be hidden

## 2020-03-31 ENCOUNTER — Other Ambulatory Visit (HOSPITAL_COMMUNITY): Payer: Self-pay | Admitting: Internal Medicine

## 2020-03-31 DIAGNOSIS — E440 Moderate protein-calorie malnutrition: Secondary | ICD-10-CM | POA: Diagnosis not present

## 2020-03-31 DIAGNOSIS — R7881 Bacteremia: Secondary | ICD-10-CM | POA: Diagnosis not present

## 2020-03-31 DIAGNOSIS — R296 Repeated falls: Secondary | ICD-10-CM | POA: Diagnosis not present

## 2020-03-31 LAB — CREATININE, SERUM
Creatinine, Ser: 0.38 mg/dL — ABNORMAL LOW (ref 0.44–1.00)
GFR, Estimated: >60 mL/min (ref >60)

## 2020-03-31 MED ORDER — HYDROCODONE-ACETAMINOPHEN 5-325 MG PO TABS: 1.0000 | ORAL_TABLET | Freq: Four times a day (QID) | ORAL | Status: DC | PRN

## 2020-03-31 MED ORDER — HYDROCODONE-ACETAMINOPHEN 5-325 MG PO TABS: 1.0000 | ORAL_TABLET | Freq: Four times a day (QID) | ORAL | 0 refills | Status: DC | PRN

## 2020-03-31 MED ORDER — PROPRANOLOL HCL 10 MG PO TABS: 10.0000 mg | ORAL_TABLET | Freq: Two times a day (BID) | ORAL | 0 refills | Status: DC

## 2020-03-31 MED ORDER — VANCOMYCIN HCL IN DEXTROSE 1-5 GM/200ML-% IV SOLN
1000.0000 mg | Freq: Two times a day (BID) | INTRAVENOUS | Status: DC
  Administered 2020-03-31: 1000 mg via INTRAVENOUS
  Filled 2020-03-31: qty 200

## 2020-03-31 MED ORDER — LINEZOLID 600 MG PO TABS: 600.0000 mg | ORAL_TABLET | Freq: Two times a day (BID) | ORAL | 0 refills | Status: DC

## 2020-03-31 MED ORDER — LINEZOLID 600 MG PO TABS
600.0000 mg | ORAL_TABLET | Freq: Two times a day (BID) | ORAL | Status: DC
  Administered 2020-03-31: 600 mg via ORAL
  Filled 2020-03-31 (×4): qty 1

## 2020-03-31 NOTE — Discharge Summary (Addendum)
Physician Discharge Summary  Faith [name] NFA:[id number] DOB: [birth year] DOA: 03/13/2020  PCP: Doree Albee, MD  Admit date: 03/13/2020 Discharge date: 03/31/2020  Admitted From: home Discharge disposition: home   Recommendations for Outpatient Follow-Up:   1. Patient was unable to be placed in a skilled nursing facility for short-term rehab due to no bed availability after and exhaustive search.  Have arranged for home health and our social workers will continue to work to help patient find placement if needed 2. Hoyer lift order placed 3. Outpatient palliative care referral 4. IV vanc has been changed to zyvox-- while on zyvox her ultram change been changed to norco once done with abx can go back to ultram   Discharge Diagnosis:   Principal Problem:   Decubital ulcer Active Problems:   Falls frequently   Multiple sclerosis (HCC)   SIRS (systemic inflammatory response syndrome) (HCC)   Sacral decubitus ulcer, stage II ([place])   Acute appendicitis   Bacteremia   Malnutrition of moderate degree    Discharge Condition: Improved.  Diet recommendation: Low sodium, heart healthy.  Wound care: None.  Code status: Full.   History of Present Illness:   Faith [name] is a 65 y.o. female with medical history significant for multiple sclerosis, COPD, pressure ulcer, hypertension. Patient was brought to the ED via EMS with complaints of fall, and family not being able to take care of her.  My evaluation patient is awake and alert and able to give me detailed history.  Her knees have just been giving way.  She ambulates with a motorized wheelchair and a walker.  She did not hit her head.  Patient was in the ED yesterday for fall and right shoulder pain, x-ray showed moderate osteoarthritic change she was discharged home.  Family are unable to take care of patient. She has had a buttock ulcer for a while.  She reports pain from the area.  She would like to be discharged to  rehab facility after this hospitalization.  She denies pain with urination, but she reports some urinary frequency, no chest pain, no cough, no difficulty breathing, no vomiting no loose stools no abdominal pain.   Hospital Course by Problem:   MRSA bacteremia -Decubitus ulcer noted, but no other obvious source -Repeat blood culturesnegative -2D echocardiogram TTE performed without any signs of vegetations. LVEF 60 to 65% -TEE performed on 1/17 with no vegetations -IV vanc changed to PO zyvox to 04/10/2020.  Right buttock decubitus ulcer -Appears to be mostly stage II with no need for debridement per general surgery Pressure Injury 03/13/20 Sacrum Medial Deep Tissue Pressure Injury - Purple or maroon localized area of discolored intact skin or blood-filled blister due to damage of underlying soft tissue from pressure and/or shear. 6.5 cm x 5.5 cm (Active)  03/13/20 1800  Location: Sacrum  Location Orientation: Medial  Staging: Deep Tissue Pressure Injury - Purple or maroon localized area of discolored intact skin or blood-filled blister due to damage of underlying soft tissue from pressure and/or shear.  Wound Description (Comments): 6.5 cm x 5.5 cm  Present on Admission: Yes    Questionable early appendicitis -Without significant abdominal pain/N/V at this time; no need for surgical intervention -Cipro and Flagyl started per GScontinue for total of 1 week, completed course on 03/22/2020.  Multiple sclerosis -Multiple falls which may be related -PT evaluation recommending SNF which patient is agreeable to -await placement vs home with increased support- TOC has been able  to reach son he is agreeable with the plan  Hypertension: Controlled. -Propranolol- lower dose  Hypokalemia/Hypomagnesemia  -repleted  Nutrition Status: Nutrition Problem: Moderate Malnutrition Etiology: chronic illness ([place]) Signs/Symptoms: mild fat depletion,moderate fat depletion,mild muscle  depletion,moderate muscle depletion Interventions: Ensure Enlive (each supplement provides 350kcal and 20 grams of protein),Magic cup,MVI,Liberalize Diet,Education   Plan for outpatient palliative care referral      Medical Consultants:   GS Cards ID   Discharge Exam:   Vitals:   03/31/20 0429 03/31/20 0916  BP: 108/69 134/81  Pulse: 63 63  Resp: 18   Temp: 97.7 F (36.5 C)   SpO2: 99%    Vitals:   03/30/20 0934 03/30/20 2118 03/31/20 0429 03/31/20 0916  BP: 127/81 108/68 108/69 134/81  Pulse: 70 66 63 63  Resp:  20 18   Temp:  98 F (36.7 C) 97.7 F (36.5 C)   TempSrc:  Oral Oral   SpO2:  99% 99%   Weight:      Height:        General exam: Appears calm and comfortable.   The results of significant diagnostics from this hospitalization (including imaging, microbiology, ancillary and laboratory) are listed below for reference.     Procedures and Diagnostic Studies:   CT PELVIS W CONTRAST  Result Date: 03/13/2020 CLINICAL DATA:  65 year old female with decubitus sacral ulcer. EXAM: CT PELVIS WITH CONTRAST TECHNIQUE: Multidetector CT imaging of the pelvis was performed using the standard protocol following the bolus administration of intravenous contrast. CONTRAST:  66mL OMNIPAQUE IOHEXOL 300 MG/ML  SOLN COMPARISON:  None. FINDINGS: Urinary Tract:  The urinary bladder is unremarkable. Bowel: No bowel dilatation. Mildly thickened appendix measuring approximately 11 mm in thickness. There is mild haziness of the peripancreatic Cl fat. Clinical correlation is recommended. Vascular/Lymphatic: Advanced aortoiliac atherosclerotic disease. No adenopathy. Reproductive: The uterus is retroverted and grossly unremarkable. No adnexal masses. Mildly dilated pelvic vasculature. Other: There is inflammatory changes and induration of the superficial soft tissues posterior and inferior to the distal sacrum/coccyx and to the right of the midline extending inferiorly along the  intergluteal cleft. An ill-defined low attenuating area in the superficial soft tissues of the right buttock measures 5.4 x 4.5 cm (coronal 56/5). This most likely represents a phlegmonous tissue. No drainable fluid collection noted. Musculoskeletal: Osteopenia. No acute osseous pathology. No bone erosion or evidence of acute osteomyelitis. IMPRESSION: 1. Phlegmonous changes of the soft tissues posterior and inferior to the sacrum and to the right of the midline. No drainable fluid collection. No CT findings of acute osteomyelitis. 2. Mildly thickened appendix with mild haziness of the peripancreatic Cl fat. Clinical correlation is recommended to exclude early acute appendicitis. 3. Aortic Atherosclerosis (ICD10-I70.0). Electronically Signed   By: Anner Crete M.D.   On: 03/13/2020 17:23   DG Chest Port 1 View  Result Date: 03/13/2020 CLINICAL DATA:  Sepsis. EXAM: PORTABLE CHEST 1 VIEW COMPARISON:  January 28, 2009. FINDINGS: The heart size and mediastinal contours are within normal limits. Both lungs are clear. The visualized skeletal structures are unremarkable. IMPRESSION: No active disease. Electronically Signed   By: Marijo Conception M.D.   On: 03/13/2020 11:29     Labs:   Basic Metabolic Panel: Recent Labs  Lab 03/25/20 0551 03/26/20 0619 03/28/20 0512 03/31/20 0452  CREATININE  --  0.37* 0.36* 0.38*  MG 1.8  --   --   --    GFR Estimated Creatinine Clearance: 49.3 mL/min (A) (by C-G formula based on  SCr of 0.38 mg/dL (L)). Liver Function Tests: No results for input(s): AST, ALT, ALKPHOS, BILITOT, PROT, ALBUMIN in the last 168 hours. No results for input(s): LIPASE, AMYLASE in the last 168 hours. No results for input(s): AMMONIA in the last 168 hours. Coagulation profile No results for input(s): INR, PROTIME in the last 168 hours.  CBC: No results for input(s): WBC, NEUTROABS, HGB, HCT, MCV, PLT in the last 168 hours. Cardiac Enzymes: No results for input(s): CKTOTAL, CKMB,  CKMBINDEX, TROPONINI in the last 168 hours. BNP: Invalid input(s): POCBNP CBG: No results for input(s): GLUCAP in the last 168 hours. D-Dimer No results for input(s): DDIMER in the last 72 hours. Hgb A1c No results for input(s): HGBA1C in the last 72 hours. Lipid Profile No results for input(s): CHOL, HDL, LDLCALC, TRIG, CHOLHDL, LDLDIRECT in the last 72 hours. Thyroid function studies No results for input(s): TSH, T4TOTAL, T3FREE, THYROIDAB in the last 72 hours.  Invalid input(s): FREET3 Anemia work up No results for input(s): VITAMINB12, FOLATE, FERRITIN, TIBC, IRON, RETICCTPCT in the last 72 hours. Microbiology No results found for this or any previous visit (from the past 240 hour(s)).   Discharge Instructions:   Discharge Instructions    Diet - low sodium heart healthy   Complete by: As directed    Increase activity slowly   Complete by: As directed    No wound care   Complete by: As directed      Allergies as of 03/31/2020      Reactions   Darvon [propoxyphene] Other (See Comments)   Hallicuations   Oxycodone-acetaminophen    Does not have any affect on her   Penicillins Rash   Did it involve swelling of the face/tongue/throat, SOB, or low BP? Unknown Did it involve sudden or severe rash/hives, skin peeling, or any reaction on the inside of your mouth or nose? Unknown Did you need to seek medical attention at a hospital or doctor's office? Unknown When did it last happen?Unknown If all above answers are "NO", may proceed with cephalosporin use.      Medication List    STOP taking these medications   traMADol 50 MG tablet Commonly known as: ULTRAM     TAKE these medications   atorvastatin 20 MG tablet Commonly known as: LIPITOR Take 1 tablet (20 mg total) by mouth daily.   baclofen 10 MG tablet Commonly known as: LIORESAL Take 1 tablet (10 mg total) by mouth 3 (three) times daily.   celecoxib 200 MG capsule Commonly known as: CELEBREX TAKE (1)  CAPSULE BY MOUTH EVERY 12 HOURS. What changed: See the new instructions.   Cholecalciferol 50 MCG (2000 UT) Tabs Take 2,000 Int'l Units by mouth daily.   diazepam 5 MG tablet Commonly known as: VALIUM TAKE (1) TABLET BY MOUTH EVERY SIX HOURS AS NEEDED FOR MUSCLE SPASMS. At least 3x/day- need to take it. For spasms   gabapentin 300 MG capsule Commonly known as: Neurontin Take 2 capsules (600 mg total) by mouth 3 (three) times daily.   HYDROcodone-acetaminophen 5-325 MG tablet Commonly known as: NORCO/VICODIN Take 1 tablet by mouth every 6 (six) hours as needed for moderate pain. Once off zyvox can go back on tramadol/ultram   ibuprofen 800 MG tablet Commonly known as: ADVIL TAKE (1) TABLET BY MOUTH DAILY AS NEEDED. What changed: See the new instructions.   linezolid 600 MG tablet Commonly known as: Zyvox Take 1 tablet (600 mg total) by mouth 2 (two) times daily.   propranolol 10  MG tablet Commonly known as: INDERAL Take 1 tablet (10 mg total) by mouth 2 (two) times daily. What changed:   medication strength  how much to take  Another medication with the same name was removed. Continue taking this medication, and follow the directions you see here.       Follow-up Information    Virl Cagey, MD Follow up on 04/22/2020.   Specialty: General Surgery Why: 1 month from discharge  Contact information: [address] Linna [facility] [phone]        Doree Albee, MD Follow up in 1 week(s).   Specialty: Internal Medicine Contact information: [place] [address] [phone]                Time coordinating discharge: 35 min  Signed:  Geradine Girt DO  [facility] 03/31/2020, 1:00 PM

## 2020-03-31 NOTE — TOC Transition Note (Signed)
Transition of Care [facility]) - CM/SW Discharge Note   Patient Details  Name: Faith [name] MRN: [id number] Date of Birth: Aug 26, 1955  [facility] of [facility]) CM/SW Contact:  Boneta Lucks, RN Phone Number: 03/31/2020, 1:16 PM   Clinical Narrative:   Discharge planning.  TOC did not get a bed offer for SNF. Patient lives with her son. He is requesting hoyer lift and HHRN and will take care of his mother at home. Patient will be able to go home on oral antibiotics.  Linda with [facility] accepted the order for HHPT/RN.  Rodney with [facility] is having hoyer lift delivered today to the home.     Final next level of care: [facility] Barriers to Discharge: Barriers Resolved   Patient Goals and CMS Choice Patient states their goals for this hospitalization and ongoing recovery are:: going home with home health. CMS Medicare.gov Compare Post Acute Care list provided to:: Patient Choice offered to / list presented to : Patient  Discharge Placement              Patient chooses bed at:  [facility]) Patient to be transferred to facility by: EMS Name of family member notified: [name] Patient and family notified of of transfer: 03/31/20  Discharge Plan and Services In-house Referral: Clinical Social Work   Post Acute Care Choice: [place]          DME Arranged: Other see comment [facility] Lift) DME Agency: [facility] Date DME Agency Contacted: 03/31/20 Time DME Agency Contacted: [phone] Representative spoke with at DME Agency: [place]: RN [facility] Agency: [place] ([place]) Date [place]: 03/31/20 Time [place]: 1000 Representative spoke with at [place]: Romualdo Bolk  Readmission Risk Interventions Readmission Risk Prevention Plan 03/31/2020 03/19/2020  Post Dischage Appt Complete -  Medication Screening Complete Complete  Transportation Screening Complete Complete  Some recent data might be hidden

## 2020-03-31 NOTE — Progress Notes (Signed)
Patient being discharged home with home health RN and home health PT. Patients discharge paper work has been given and reviewed with patient. Patient has no further questions. Single Lumen PICC line removed by Jacques Navy RN. No complications from removal. Compression dressing applied. EMS to transport patient. Awaiting for EMS to transport.

## 2020-03-31 NOTE — Progress Notes (Signed)
EMS transported patient to patients home.  Patient stable upon discharge.

## 2020-03-31 NOTE — Progress Notes (Signed)
Physical Therapy Treatment Patient Details Name: Faith [name] MRN: [id number] DOB: December 14, 1955 Today's Date: 03/31/2020    History of Present Illness Faith [name] is a 65 y.o. female with medical history significant for multiple sclerosis, COPD, pressure ulcer, hypertension.  Patient was brought to the ED via EMS with complaints of fall, and family not being able to take care of her.  My evaluation patient is awake and alert and able to give me detailed history.  Her knees have just been giving way.  She ambulates with a motorized wheelchair and a walker.  She did not hit her head.  Patient was in the ED yesterday for fall and right shoulder pain, x-ray showed moderate osteoarthritic change she was discharged home.  Family are unable to take care of patient.  She has had a buttock ulcer for a while.  She reports pain from the area.  She would like to be discharged to rehab facility after this hospitalization.  She denies pain with urination, but she reports some urinary frequency, no chest pain, no cough, no difficulty breathing, no vomiting no loose stools no abdominal pain.    PT Comments    Patient demonstrates continued weakness requiring physical assistance to perform sit to stand transfers and requires proximal support/steadying when mobilizing in standing due to generalized weakness and resultant balance deficits.  Patient motivated to participate but limited in capabilities due to reduced functional activity tolerance requiring frequent therapeutic rest periods between trials/activities.  Continued services indicated to improve strength and safety with mobility to reduce risk for falls and reduce burden of care.   Follow Up Recommendations  SNF;Supervision for mobility/OOB;Supervision - Intermittent     Equipment Recommendations  None recommended by PT    Recommendations for Other Services       Precautions / Restrictions      Mobility  Bed Mobility Overal bed mobility: Needs  Assistance Bed Mobility: Sit to Supine Rolling: Min assist     Sit to supine: Mod assist   General bed mobility comments: scooting w/ min A to perform bridge to maintain skin integrity  Transfers Overall transfer level: Needs assistance Equipment used: Rolling walker (2 wheeled) Transfers: Sit to/from [facility] Sit to Stand: Mod assist Stand pivot transfers: Mod assist       General transfer comment: Mod A for sit to stand from 20" seat height due to LE weakness. Min-mod A with RW during pivoting requiring trunk support to prevent LOB  Ambulation/Gait                 Stairs             Wheelchair Mobility    Modified Rankin (Stroke Patients Only)       Balance                                            Cognition                                              Exercises General Exercises - Lower Extremity Ankle Circles/Pumps: Strengthening;Both;20 reps;Seated Long Arc Quad: Seated;AROM;Strengthening;Both;10 reps Hip Flexion/Marching: Seated;AROM;Strengthening;Both;10 reps Toe Raises: Seated;AROM;Strengthening;Both;10 reps Heel Raises: Seated;AROM;Strengthening;10 reps;Both    General Comments  Pertinent Vitals/Pain      Home Living Family/patient expects to be discharged to:: Private residence Living Arrangements: Children Available Help at Discharge: Family;Personal care attendant (has PCA 4x/week 2-3 hours per time) Type of Home: House Home Access: Ramped entrance   Home Layout: Two level;Able to live on main level with bedroom/bathroom Home Equipment: Walker - 4 wheels;Wheelchair - power;Hospital bed;Bedside commode;Shower seat      Prior Function Level of Independence: Needs assistance  Gait / Transfers Assistance Needed: Patient reports she ambulates inside home with rollator or power wheelchair ADL's / Homemaking Assistance Needed: Son assists and PCA 4x per week for 3 hours  a day     PT Goals (current goals can now be found in the care plan section) Acute Rehab PT Goals Patient Stated Goal: get strong and return home PT Goal Formulation: With patient Time For Goal Achievement: 04/10/20 Potential to Achieve Goals: Good    Frequency    Min 3X/week      PT Plan      Co-evaluation              AM-PAC PT "6 Clicks" Mobility   Outcome Measure  Help needed turning from your back to your side while in a flat bed without using bedrails?: A Little Help needed moving from lying on your back to sitting on the side of a flat bed without using bedrails?: A Lot Help needed moving to and from a bed to a chair (including a wheelchair)?: A Lot Help needed standing up from a chair using your arms (e.g., wheelchair or bedside chair)?: A Lot Help needed to walk in hospital room?: A Lot Help needed climbing 3-5 steps with a railing? : Total 6 Click Score: 12    End of Session Equipment Utilized During Treatment: Gait belt Activity Tolerance: Patient tolerated treatment well;Patient limited by fatigue Patient left: in chair;with call bell/phone within reach;with family/visitor present Nurse Communication: Mobility status PT Visit Diagnosis: Unsteadiness on feet (R26.81);Muscle weakness (generalized) (M62.81);Other abnormalities of gait and mobility (R26.89);Difficulty in walking, not elsewhere classified (R26.2);History of falling (Z91.81)     Time: 4540-9811 PT Time Calculation (min) (ACUTE ONLY): 27 min  Charges:  $Therapeutic Exercise: 8-22 mins $Therapeutic Activity: 8-22 mins                    12:34 PM, 03/31/20 M. Sherlyn Lees, PT, DPT Physical Therapist- [place] Office Number: [phone]

## 2020-04-01 ENCOUNTER — Telehealth (INDEPENDENT_AMBULATORY_CARE_PROVIDER_SITE_OTHER): Payer: Self-pay

## 2020-04-01 NOTE — Telephone Encounter (Signed)
Error

## 2020-04-01 NOTE — Telephone Encounter (Signed)
Okay, I will discuss everything when I see her in a few days time.

## 2020-04-01 NOTE — Telephone Encounter (Signed)
Pt is home now from hospital stay. Her orders on  D/c NO wound care orders.  Stop Tramadol now taking  hydrocodone. Took her PICC line out and Linomide 600 mg 2 x day. Would like to let us know she would benefit on to [place] stated to nurse that Hospital staff stated the same thing. But wanted to notify [facility] and would be glad to give a Hospice a referral if you agree. They can start the process. But she know shewill be in the office to see Dr Anastasio Champion on Thursday ,04/04/2020.

## 2020-04-02 ENCOUNTER — Other Ambulatory Visit (INDEPENDENT_AMBULATORY_CARE_PROVIDER_SITE_OTHER): Payer: Self-pay | Admitting: Internal Medicine

## 2020-04-02 ENCOUNTER — Telehealth (INDEPENDENT_AMBULATORY_CARE_PROVIDER_SITE_OTHER): Payer: Self-pay

## 2020-04-02 DIAGNOSIS — G35 Multiple sclerosis: Secondary | ICD-10-CM

## 2020-04-02 NOTE — Telephone Encounter (Signed)
Okay, I found [facility] on epic and I have made an electronic referral.  Thanks.

## 2020-04-02 NOTE — Telephone Encounter (Signed)
Horris Latino from [employer] called and stated that [place] stated that the patient needs Hospice now and not Palliative care. Patient has gone down hill severely since coming home from the hospital. Patient needs a referral and can be faxed to [phone].

## 2020-04-03 ENCOUNTER — Other Ambulatory Visit: Payer: Self-pay

## 2020-04-03 ENCOUNTER — Encounter (INDEPENDENT_AMBULATORY_CARE_PROVIDER_SITE_OTHER): Payer: Self-pay | Admitting: Internal Medicine

## 2020-04-03 ENCOUNTER — Ambulatory Visit (INDEPENDENT_AMBULATORY_CARE_PROVIDER_SITE_OTHER): Payer: Medicaid Other | Admitting: Internal Medicine

## 2020-04-03 VITALS — BP 110/70 | HR 68 | Temp 96.3°F | Resp 18

## 2020-04-03 DIAGNOSIS — E785 Hyperlipidemia, unspecified: Secondary | ICD-10-CM

## 2020-04-03 DIAGNOSIS — R627 Adult failure to thrive: Secondary | ICD-10-CM | POA: Diagnosis not present

## 2020-04-03 DIAGNOSIS — G35 Multiple sclerosis: Secondary | ICD-10-CM | POA: Diagnosis not present

## 2020-04-03 NOTE — Progress Notes (Signed)
Metrics: Intervention Frequency ACO  Documented Smoking Status Yearly  Screened one or more times in 24 months  Cessation Counseling or  Active cessation medication Past 24 months  Past 24 months   Guideline developer: UpToDate (See UpToDate for funding source) Date Released: 2014       Wellness Office Visit  Subjective:  Patient ID: Faith [name], female    DOB: June 15, 1955  Age: 65 y.o. MRN: [id number]  CC: This lady comes in for follow-up after hospitalization for bacteremia from decubitus ulcer with MRSA. HPI  The plan was to try and get her into a skilled nursing facility for rehabilitation but this was unsuccessful.  [facility] was consulted but they did not feel she was appropriate for their care.  [facility] from [place] now has apparently accepted this patient and I will confirm this. She continues in failing to thrive.  Her multiple sclerosis is deteriorating. She is able to feed herself but she is wheelchair-bound and requires help with all activities of daily living appears. Past Medical History:  Diagnosis Date  . Anemia    my whole life, Used to take iron  . Arthritis    spine, lumbar  . COPD (chronic obstructive pulmonary disease) ([place])   . Emphysema of lung ([place])   . HTN (hypertension) 03/01/2020  . Hyperlipidemia 11/28/2019  . Narcolepsy    by history, has nightmares  . Neuromuscular disorder ([place])    leg issues from spine   Past Surgical History:  Procedure Laterality Date  . ANTERIOR CERVICAL DECOMP/DISCECTOMY FUSION N/A 01/19/2019   Procedure: ANTERIOR CERVICAL DECOMPRESSION/DISCECTOMY Cervical three - four;  Surgeon: Ashok Pall, MD;  Location: [facility];  Service: Neurosurgery;  Laterality: N/A;  . EYE SURGERY Right   . ORIF FACIAL FRACTURE    . POSTERIOR CERVICAL LAMINECTOMY N/A 01/19/2019   Procedure: POSTERIOR CERVICAL LAMINECTOMY Removal of Synovial Cyst and posterior cervical fusion;  Surgeon: Ashok Pall, MD;  Location: [facility];   Service: Neurosurgery;  Laterality: N/A;  . TEE WITHOUT CARDIOVERSION N/A 03/17/2020   Procedure: TRANSESOPHAGEAL ECHOCARDIOGRAM (TEE) WITH PROPOFOL;  Surgeon: Satira Sark, MD;  [facility];  Service: Cardiovascular;  Laterality: N/A;  . TUBAL LIGATION    . tubaligation       Family History  Problem Relation Age of Onset  . Heart attack Mother   . Kidney failure Mother   . Diabetes Mother   . Heart disease Mother   . Hyperlipidemia Mother   . Hypertension Mother   . Heart attack Father 36  . Hypertension Father   . Hypertension Sister   . Diabetes Sister   . Other Brother        house fire  . Hypertension Brother   . Seizures Brother   . Heart disease Brother        mitral valve  . Arthritis Daughter        DJD, AS fibromyalgia  . Polymyositis Daughter   . Cancer Maternal Grandfather        lung  . Heart disease Maternal Grandfather   . Other Paternal Grandmother        house fire  . Alcohol abuse Son     Social History   Social History Narrative   Patient lives with her son. [name], age 36, frequently visits   Social History   Tobacco Use  . Smoking status: Current Every Day Smoker    Packs/day: 0.25    Years: 45.00  Pack years: 11.25  . Smokeless tobacco: Never Used  . Tobacco comment: cutting down  Substance Use Topics  . Alcohol use: Yes    Alcohol/week: 2.0 standard drinks    Types: 2 Cans of beer per week    Current Meds  Medication Sig  . atorvastatin (LIPITOR) 20 MG tablet Take 1 tablet (20 mg total) by mouth daily.  . baclofen (LIORESAL) 10 MG tablet Take 1 tablet (10 mg total) by mouth 3 (three) times daily.  . celecoxib (CELEBREX) 200 MG capsule TAKE (1) CAPSULE BY MOUTH EVERY 12 HOURS. (Patient taking differently: Take 200 mg by mouth 2 (two) times daily.)  . Cholecalciferol 50 MCG (2000 UT) TABS Take 2,000 Int'l Units by mouth daily.  . diazepam (VALIUM) 5 MG tablet TAKE (1) TABLET BY MOUTH EVERY SIX HOURS AS  NEEDED FOR MUSCLE SPASMS. At least 3x/day- need to take it. For spasms  . gabapentin (NEURONTIN) 300 MG capsule Take 2 capsules (600 mg total) by mouth 3 (three) times daily.  Marland Kitchen HYDROcodone-acetaminophen (NORCO/VICODIN) 5-325 MG tablet Take 1 tablet by mouth every 6 (six) hours as needed for moderate pain. Once off zyvox can go back on tramadol/ultram  . linezolid (ZYVOX) 600 MG tablet Take 1 tablet (600 mg total) by mouth 2 (two) times daily.  . propranolol (INDERAL) 10 MG tablet Take 1 tablet (10 mg total) by mouth 2 (two) times daily.  . [DISCONTINUED] ibuprofen (ADVIL) 800 MG tablet TAKE (1) TABLET BY MOUTH DAILY AS NEEDED. (Patient taking differently: Take 800 mg by mouth daily as needed.)      Depression screen [facility] 2/9 02/08/2020 02/08/2020 12/24/2019 09/24/2019 09/20/2019  Decreased Interest 0 1 0 0 0  Down, Depressed, Hopeless 0 1 0 0 0  PHQ - 2 Score 0 2 0 0 0     Objective:   Today's Vitals: BP 110/70 (BP Location: Right Arm, Patient Position: Sitting, Cuff Size: Normal)   Pulse 68   Temp (!) 96.3 F (35.7 C) (Temporal)   Resp 18  Vitals with BMI 04/03/2020 03/31/2020 03/31/2020  Height - - -  Weight (No Data) - -  BMI - - -  Systolic 976 734 193  Diastolic 70 65 81  Pulse 68 71 63     Physical Exam    In a wheelchair in the exam room.  Is alert and orientated but clearly cachectic.   Assessment   1. Multiple sclerosis ([place])   2. Hyperlipidemia, unspecified hyperlipidemia type   3. Failure to thrive syndrome, adult       Tests ordered No orders of the defined types were placed in this encounter.    Plan: 1. I discussed with the patient the philosophy of palliative care and hospice care. 2. I recommended that she discontinue statin therapy now as this will not add to her management whatsoever. 3. She will continue all the other medications for now.  These will be used for symptom relief. 4. Follow-up in 6 months.  I spent 35 minutes discussing with her the  philosophy of palliative and hospice care and how quality of life is most important.   No orders of the defined types were placed in this encounter.   Doree Albee, MD

## 2020-04-04 DIAGNOSIS — G35 Multiple sclerosis: Secondary | ICD-10-CM | POA: Diagnosis not present

## 2020-04-05 DIAGNOSIS — G35 Multiple sclerosis: Secondary | ICD-10-CM | POA: Diagnosis not present

## 2020-04-06 DIAGNOSIS — G35 Multiple sclerosis: Secondary | ICD-10-CM | POA: Diagnosis not present

## 2020-04-07 ENCOUNTER — Telehealth: Payer: Self-pay | Admitting: Physical Medicine and Rehabilitation

## 2020-04-07 ENCOUNTER — Ambulatory Visit: Payer: Medicaid Other | Admitting: Physical Medicine and Rehabilitation

## 2020-04-07 DIAGNOSIS — G35 Multiple sclerosis: Secondary | ICD-10-CM | POA: Diagnosis not present

## 2020-04-07 NOTE — Telephone Encounter (Signed)
Daughter wanted to let Dr. Dagoberto Ligas know that patient is in Hospice care.

## 2020-04-07 NOTE — Telephone Encounter (Signed)
L/M on voicemail acknowledging note that pt is in hospice care- I left a message saying to give me a call if there's anything else I can do.

## 2020-04-08 DIAGNOSIS — G35 Multiple sclerosis: Secondary | ICD-10-CM | POA: Diagnosis not present

## 2020-04-09 DIAGNOSIS — G35 Multiple sclerosis: Secondary | ICD-10-CM | POA: Diagnosis not present

## 2020-04-11 DIAGNOSIS — G35 Multiple sclerosis: Secondary | ICD-10-CM | POA: Diagnosis not present

## 2020-04-12 DIAGNOSIS — G35 Multiple sclerosis: Secondary | ICD-10-CM | POA: Diagnosis not present

## 2020-04-13 DIAGNOSIS — G35 Multiple sclerosis: Secondary | ICD-10-CM | POA: Diagnosis not present

## 2020-04-14 DIAGNOSIS — G35 Multiple sclerosis: Secondary | ICD-10-CM | POA: Diagnosis not present

## 2020-04-15 DIAGNOSIS — G35 Multiple sclerosis: Secondary | ICD-10-CM | POA: Diagnosis not present

## 2020-04-16 DIAGNOSIS — G35 Multiple sclerosis: Secondary | ICD-10-CM | POA: Diagnosis not present

## 2020-04-17 ENCOUNTER — Ambulatory Visit (INDEPENDENT_AMBULATORY_CARE_PROVIDER_SITE_OTHER): Payer: Medicaid Other | Admitting: Internal Medicine

## 2020-04-17 DIAGNOSIS — G35 Multiple sclerosis: Secondary | ICD-10-CM | POA: Diagnosis not present

## 2020-04-21 ENCOUNTER — Other Ambulatory Visit (INDEPENDENT_AMBULATORY_CARE_PROVIDER_SITE_OTHER): Payer: Self-pay | Admitting: Internal Medicine

## 2020-04-21 ENCOUNTER — Telehealth (INDEPENDENT_AMBULATORY_CARE_PROVIDER_SITE_OTHER): Payer: Self-pay

## 2020-04-21 MED ORDER — LINEZOLID 600 MG PO TABS: 600.0000 mg | ORAL_TABLET | Freq: Two times a day (BID) | ORAL | 0 refills | Status: DC

## 2020-04-21 MED ORDER — HYDROCODONE-ACETAMINOPHEN 5-325 MG PO TABS: 1.0000 | ORAL_TABLET | Freq: Four times a day (QID) | ORAL | 0 refills | Status: DC | PRN

## 2020-04-21 NOTE — Telephone Encounter (Signed)
Rickey Barbara from [facility] called and stated that this patient needs a refill of the following medications sent to [facility]:  HYDROcodone-acetaminophen (NORCO/VICODIN) 5-325 MG tablet  Last filled on 03/31/2020 from hospital,  # 30 with 0 refills  linezolid (ZYVOX) 600 MG tablet  Last filled on 03/31/2020 from hospital, # 20 with 0 refills

## 2020-04-22 ENCOUNTER — Other Ambulatory Visit (INDEPENDENT_AMBULATORY_CARE_PROVIDER_SITE_OTHER): Payer: Self-pay | Admitting: Internal Medicine

## 2020-04-26 ENCOUNTER — Other Ambulatory Visit (INDEPENDENT_AMBULATORY_CARE_PROVIDER_SITE_OTHER): Payer: Self-pay | Admitting: Nurse Practitioner

## 2020-04-26 DIAGNOSIS — E785 Hyperlipidemia, unspecified: Secondary | ICD-10-CM

## 2020-04-26 DIAGNOSIS — Z1329 Encounter for screening for other suspected endocrine disorder: Secondary | ICD-10-CM

## 2020-04-26 DIAGNOSIS — E559 Vitamin D deficiency, unspecified: Secondary | ICD-10-CM

## 2020-04-26 DIAGNOSIS — I700 Atherosclerosis of aorta: Secondary | ICD-10-CM

## 2020-04-29 ENCOUNTER — Telehealth (INDEPENDENT_AMBULATORY_CARE_PROVIDER_SITE_OTHER): Payer: Self-pay

## 2020-04-29 DIAGNOSIS — D649 Anemia, unspecified: Secondary | ICD-10-CM | POA: Diagnosis not present

## 2020-04-29 DIAGNOSIS — R296 Repeated falls: Secondary | ICD-10-CM | POA: Diagnosis not present

## 2020-04-29 DIAGNOSIS — E785 Hyperlipidemia, unspecified: Secondary | ICD-10-CM | POA: Diagnosis not present

## 2020-04-29 DIAGNOSIS — E440 Moderate protein-calorie malnutrition: Secondary | ICD-10-CM | POA: Diagnosis not present

## 2020-04-29 DIAGNOSIS — E46 Unspecified protein-calorie malnutrition: Secondary | ICD-10-CM | POA: Diagnosis not present

## 2020-04-29 DIAGNOSIS — R7881 Bacteremia: Secondary | ICD-10-CM | POA: Diagnosis not present

## 2020-04-29 DIAGNOSIS — I10 Essential (primary) hypertension: Secondary | ICD-10-CM | POA: Diagnosis not present

## 2020-04-29 DIAGNOSIS — G47419 Narcolepsy without cataplexy: Secondary | ICD-10-CM | POA: Diagnosis not present

## 2020-04-29 DIAGNOSIS — Z681 Body mass index (BMI) 19 or less, adult: Secondary | ICD-10-CM | POA: Diagnosis not present

## 2020-04-29 DIAGNOSIS — G35 Multiple sclerosis: Secondary | ICD-10-CM | POA: Diagnosis not present

## 2020-04-29 DIAGNOSIS — J449 Chronic obstructive pulmonary disease, unspecified: Secondary | ICD-10-CM | POA: Diagnosis not present

## 2020-04-29 DIAGNOSIS — R29898 Other symptoms and signs involving the musculoskeletal system: Secondary | ICD-10-CM | POA: Diagnosis not present

## 2020-04-29 DIAGNOSIS — L89152 Pressure ulcer of sacral region, stage 2: Secondary | ICD-10-CM | POA: Diagnosis not present

## 2020-04-29 DIAGNOSIS — Z8614 Personal history of Methicillin resistant Staphylococcus aureus infection: Secondary | ICD-10-CM | POA: Diagnosis not present

## 2020-04-29 DIAGNOSIS — L89153 Pressure ulcer of sacral region, stage 3: Secondary | ICD-10-CM | POA: Diagnosis not present

## 2020-04-29 NOTE — Telephone Encounter (Signed)
Cheri, a triage nurse from [facility], called and stated they need to verify the correct dosage of the following medication and need a refill sent to [facility]:  propranolol (INDERAL) 10 MG tablet  Last sent 03/31/2020, # 60 with 0 refills  Cheri is not sure if this is the correct dosage as last sent from hospitalist and wanted to verify if correct dosage.   Please advise and I will call her back at [phone]. Thank you.

## 2020-04-30 ENCOUNTER — Other Ambulatory Visit (INDEPENDENT_AMBULATORY_CARE_PROVIDER_SITE_OTHER): Payer: Self-pay | Admitting: Internal Medicine

## 2020-04-30 DIAGNOSIS — L89153 Pressure ulcer of sacral region, stage 3: Secondary | ICD-10-CM | POA: Diagnosis not present

## 2020-04-30 DIAGNOSIS — Z681 Body mass index (BMI) 19 or less, adult: Secondary | ICD-10-CM | POA: Diagnosis not present

## 2020-04-30 DIAGNOSIS — R296 Repeated falls: Secondary | ICD-10-CM | POA: Diagnosis not present

## 2020-04-30 DIAGNOSIS — E785 Hyperlipidemia, unspecified: Secondary | ICD-10-CM | POA: Diagnosis not present

## 2020-04-30 DIAGNOSIS — E46 Unspecified protein-calorie malnutrition: Secondary | ICD-10-CM | POA: Diagnosis not present

## 2020-04-30 DIAGNOSIS — D649 Anemia, unspecified: Secondary | ICD-10-CM | POA: Diagnosis not present

## 2020-04-30 DIAGNOSIS — G47419 Narcolepsy without cataplexy: Secondary | ICD-10-CM | POA: Diagnosis not present

## 2020-04-30 DIAGNOSIS — I10 Essential (primary) hypertension: Secondary | ICD-10-CM | POA: Diagnosis not present

## 2020-04-30 DIAGNOSIS — G35 Multiple sclerosis: Secondary | ICD-10-CM | POA: Diagnosis not present

## 2020-04-30 DIAGNOSIS — Z8614 Personal history of Methicillin resistant Staphylococcus aureus infection: Secondary | ICD-10-CM | POA: Diagnosis not present

## 2020-04-30 DIAGNOSIS — J449 Chronic obstructive pulmonary disease, unspecified: Secondary | ICD-10-CM | POA: Diagnosis not present

## 2020-04-30 MED ORDER — PROPRANOLOL HCL 10 MG PO TABS: 10.0000 mg | ORAL_TABLET | Freq: Two times a day (BID) | ORAL | 3 refills | Status: DC

## 2020-04-30 NOTE — Telephone Encounter (Signed)
Faith [name] is correct dose,I have sent Rx to pharmacy.

## 2020-04-30 NOTE — Telephone Encounter (Signed)
Called Judeen Hammans several times and was not able to leave a voice message. Called again and spoke to Tanaina and gave her the information and she is taking care of this for the patient. Ninfa Meeker verbalized an understanding.

## 2020-05-01 ENCOUNTER — Telehealth (INDEPENDENT_AMBULATORY_CARE_PROVIDER_SITE_OTHER): Payer: Self-pay

## 2020-05-01 DIAGNOSIS — L89153 Pressure ulcer of sacral region, stage 3: Secondary | ICD-10-CM | POA: Diagnosis not present

## 2020-05-01 DIAGNOSIS — G35 Multiple sclerosis: Secondary | ICD-10-CM | POA: Diagnosis not present

## 2020-05-01 DIAGNOSIS — R296 Repeated falls: Secondary | ICD-10-CM | POA: Diagnosis not present

## 2020-05-01 DIAGNOSIS — D649 Anemia, unspecified: Secondary | ICD-10-CM | POA: Diagnosis not present

## 2020-05-01 DIAGNOSIS — E46 Unspecified protein-calorie malnutrition: Secondary | ICD-10-CM | POA: Diagnosis not present

## 2020-05-01 DIAGNOSIS — I10 Essential (primary) hypertension: Secondary | ICD-10-CM | POA: Diagnosis not present

## 2020-05-01 DIAGNOSIS — Z681 Body mass index (BMI) 19 or less, adult: Secondary | ICD-10-CM | POA: Diagnosis not present

## 2020-05-01 DIAGNOSIS — Z8614 Personal history of Methicillin resistant Staphylococcus aureus infection: Secondary | ICD-10-CM | POA: Diagnosis not present

## 2020-05-01 DIAGNOSIS — E785 Hyperlipidemia, unspecified: Secondary | ICD-10-CM | POA: Diagnosis not present

## 2020-05-01 DIAGNOSIS — G47419 Narcolepsy without cataplexy: Secondary | ICD-10-CM | POA: Diagnosis not present

## 2020-05-01 DIAGNOSIS — J449 Chronic obstructive pulmonary disease, unspecified: Secondary | ICD-10-CM | POA: Diagnosis not present

## 2020-05-01 NOTE — Telephone Encounter (Signed)
Called patient and LMOVM to return call.  Left a detailed voice message to let patient know to finish this antibiotic and should not need another refill.

## 2020-05-01 NOTE — Telephone Encounter (Signed)
She should finish this antibiotic after the last refill.

## 2020-05-01 NOTE — Telephone Encounter (Signed)
[facility] called and stated that they did not know if the patient is taking this medication continuously or not:  linezolid (ZYVOX) 600 MG tablet  Last filled 04/21/2020, # 20 with 0 refills  If she needs to have a refill please to send to [place] or advise if not a long term antibiotic so I can call the patient and let her know.

## 2020-05-02 DIAGNOSIS — G47419 Narcolepsy without cataplexy: Secondary | ICD-10-CM | POA: Diagnosis not present

## 2020-05-02 DIAGNOSIS — R296 Repeated falls: Secondary | ICD-10-CM | POA: Diagnosis not present

## 2020-05-02 DIAGNOSIS — L89153 Pressure ulcer of sacral region, stage 3: Secondary | ICD-10-CM | POA: Diagnosis not present

## 2020-05-02 DIAGNOSIS — I10 Essential (primary) hypertension: Secondary | ICD-10-CM | POA: Diagnosis not present

## 2020-05-02 DIAGNOSIS — J449 Chronic obstructive pulmonary disease, unspecified: Secondary | ICD-10-CM | POA: Diagnosis not present

## 2020-05-02 DIAGNOSIS — Z681 Body mass index (BMI) 19 or less, adult: Secondary | ICD-10-CM | POA: Diagnosis not present

## 2020-05-02 DIAGNOSIS — E46 Unspecified protein-calorie malnutrition: Secondary | ICD-10-CM | POA: Diagnosis not present

## 2020-05-02 DIAGNOSIS — E785 Hyperlipidemia, unspecified: Secondary | ICD-10-CM | POA: Diagnosis not present

## 2020-05-02 DIAGNOSIS — G35 Multiple sclerosis: Secondary | ICD-10-CM | POA: Diagnosis not present

## 2020-05-02 DIAGNOSIS — Z8614 Personal history of Methicillin resistant Staphylococcus aureus infection: Secondary | ICD-10-CM | POA: Diagnosis not present

## 2020-05-02 DIAGNOSIS — D649 Anemia, unspecified: Secondary | ICD-10-CM | POA: Diagnosis not present

## 2020-05-03 DIAGNOSIS — L89153 Pressure ulcer of sacral region, stage 3: Secondary | ICD-10-CM | POA: Diagnosis not present

## 2020-05-03 DIAGNOSIS — G47419 Narcolepsy without cataplexy: Secondary | ICD-10-CM | POA: Diagnosis not present

## 2020-05-03 DIAGNOSIS — R296 Repeated falls: Secondary | ICD-10-CM | POA: Diagnosis not present

## 2020-05-03 DIAGNOSIS — D649 Anemia, unspecified: Secondary | ICD-10-CM | POA: Diagnosis not present

## 2020-05-03 DIAGNOSIS — G35 Multiple sclerosis: Secondary | ICD-10-CM | POA: Diagnosis not present

## 2020-05-03 DIAGNOSIS — I10 Essential (primary) hypertension: Secondary | ICD-10-CM | POA: Diagnosis not present

## 2020-05-03 DIAGNOSIS — E46 Unspecified protein-calorie malnutrition: Secondary | ICD-10-CM | POA: Diagnosis not present

## 2020-05-03 DIAGNOSIS — Z8614 Personal history of Methicillin resistant Staphylococcus aureus infection: Secondary | ICD-10-CM | POA: Diagnosis not present

## 2020-05-03 DIAGNOSIS — E785 Hyperlipidemia, unspecified: Secondary | ICD-10-CM | POA: Diagnosis not present

## 2020-05-03 DIAGNOSIS — Z681 Body mass index (BMI) 19 or less, adult: Secondary | ICD-10-CM | POA: Diagnosis not present

## 2020-05-03 DIAGNOSIS — J449 Chronic obstructive pulmonary disease, unspecified: Secondary | ICD-10-CM | POA: Diagnosis not present

## 2020-05-04 DIAGNOSIS — G35 Multiple sclerosis: Secondary | ICD-10-CM | POA: Diagnosis not present

## 2020-05-04 DIAGNOSIS — D649 Anemia, unspecified: Secondary | ICD-10-CM | POA: Diagnosis not present

## 2020-05-04 DIAGNOSIS — I10 Essential (primary) hypertension: Secondary | ICD-10-CM | POA: Diagnosis not present

## 2020-05-04 DIAGNOSIS — E785 Hyperlipidemia, unspecified: Secondary | ICD-10-CM | POA: Diagnosis not present

## 2020-05-04 DIAGNOSIS — G47419 Narcolepsy without cataplexy: Secondary | ICD-10-CM | POA: Diagnosis not present

## 2020-05-04 DIAGNOSIS — J449 Chronic obstructive pulmonary disease, unspecified: Secondary | ICD-10-CM | POA: Diagnosis not present

## 2020-05-04 DIAGNOSIS — Z681 Body mass index (BMI) 19 or less, adult: Secondary | ICD-10-CM | POA: Diagnosis not present

## 2020-05-04 DIAGNOSIS — R296 Repeated falls: Secondary | ICD-10-CM | POA: Diagnosis not present

## 2020-05-04 DIAGNOSIS — E46 Unspecified protein-calorie malnutrition: Secondary | ICD-10-CM | POA: Diagnosis not present

## 2020-05-04 DIAGNOSIS — Z8614 Personal history of Methicillin resistant Staphylococcus aureus infection: Secondary | ICD-10-CM | POA: Diagnosis not present

## 2020-05-04 DIAGNOSIS — L89153 Pressure ulcer of sacral region, stage 3: Secondary | ICD-10-CM | POA: Diagnosis not present

## 2020-05-05 DIAGNOSIS — J449 Chronic obstructive pulmonary disease, unspecified: Secondary | ICD-10-CM | POA: Diagnosis not present

## 2020-05-05 DIAGNOSIS — G35 Multiple sclerosis: Secondary | ICD-10-CM | POA: Diagnosis not present

## 2020-05-05 DIAGNOSIS — E785 Hyperlipidemia, unspecified: Secondary | ICD-10-CM | POA: Diagnosis not present

## 2020-05-05 DIAGNOSIS — Z681 Body mass index (BMI) 19 or less, adult: Secondary | ICD-10-CM | POA: Diagnosis not present

## 2020-05-05 DIAGNOSIS — R296 Repeated falls: Secondary | ICD-10-CM | POA: Diagnosis not present

## 2020-05-05 DIAGNOSIS — I10 Essential (primary) hypertension: Secondary | ICD-10-CM | POA: Diagnosis not present

## 2020-05-05 DIAGNOSIS — L89153 Pressure ulcer of sacral region, stage 3: Secondary | ICD-10-CM | POA: Diagnosis not present

## 2020-05-05 DIAGNOSIS — E46 Unspecified protein-calorie malnutrition: Secondary | ICD-10-CM | POA: Diagnosis not present

## 2020-05-05 DIAGNOSIS — Z8614 Personal history of Methicillin resistant Staphylococcus aureus infection: Secondary | ICD-10-CM | POA: Diagnosis not present

## 2020-05-05 DIAGNOSIS — G47419 Narcolepsy without cataplexy: Secondary | ICD-10-CM | POA: Diagnosis not present

## 2020-05-05 DIAGNOSIS — D649 Anemia, unspecified: Secondary | ICD-10-CM | POA: Diagnosis not present

## 2020-05-06 ENCOUNTER — Other Ambulatory Visit (INDEPENDENT_AMBULATORY_CARE_PROVIDER_SITE_OTHER): Payer: Self-pay | Admitting: Internal Medicine

## 2020-05-06 ENCOUNTER — Telehealth (INDEPENDENT_AMBULATORY_CARE_PROVIDER_SITE_OTHER): Payer: Self-pay

## 2020-05-06 DIAGNOSIS — R296 Repeated falls: Secondary | ICD-10-CM | POA: Diagnosis not present

## 2020-05-06 DIAGNOSIS — E785 Hyperlipidemia, unspecified: Secondary | ICD-10-CM | POA: Diagnosis not present

## 2020-05-06 DIAGNOSIS — E46 Unspecified protein-calorie malnutrition: Secondary | ICD-10-CM | POA: Diagnosis not present

## 2020-05-06 DIAGNOSIS — G47419 Narcolepsy without cataplexy: Secondary | ICD-10-CM | POA: Diagnosis not present

## 2020-05-06 DIAGNOSIS — D649 Anemia, unspecified: Secondary | ICD-10-CM | POA: Diagnosis not present

## 2020-05-06 DIAGNOSIS — Z681 Body mass index (BMI) 19 or less, adult: Secondary | ICD-10-CM | POA: Diagnosis not present

## 2020-05-06 DIAGNOSIS — G35 Multiple sclerosis: Secondary | ICD-10-CM | POA: Diagnosis not present

## 2020-05-06 DIAGNOSIS — J449 Chronic obstructive pulmonary disease, unspecified: Secondary | ICD-10-CM | POA: Diagnosis not present

## 2020-05-06 DIAGNOSIS — I10 Essential (primary) hypertension: Secondary | ICD-10-CM | POA: Diagnosis not present

## 2020-05-06 DIAGNOSIS — L89153 Pressure ulcer of sacral region, stage 3: Secondary | ICD-10-CM | POA: Diagnosis not present

## 2020-05-06 DIAGNOSIS — Z8614 Personal history of Methicillin resistant Staphylococcus aureus infection: Secondary | ICD-10-CM | POA: Diagnosis not present

## 2020-05-06 MED ORDER — PROPRANOLOL HCL 40 MG PO TABS: 40.0000 mg | ORAL_TABLET | Freq: Two times a day (BID) | ORAL | 3 refills | Status: DC

## 2020-05-06 NOTE — Telephone Encounter (Signed)
Okay, I have sent the correct dose to [facility].

## 2020-05-06 NOTE — Telephone Encounter (Signed)
Patient called and left a detailed voice message stating that she was taking Propranolol 40 mg twice daily and she just received a prescription from the pharmacy for the Propranolol 10 mg twice daily and she was not sure why she has a different mg?   Please advise.

## 2020-05-06 NOTE — Telephone Encounter (Signed)
Called patient and LMOVM to return call  Called patient and left a detailed voice message on her voice mail to let her know the correct medication has been sent to her pharmacy.

## 2020-05-07 DIAGNOSIS — Z8614 Personal history of Methicillin resistant Staphylococcus aureus infection: Secondary | ICD-10-CM | POA: Diagnosis not present

## 2020-05-07 DIAGNOSIS — R296 Repeated falls: Secondary | ICD-10-CM | POA: Diagnosis not present

## 2020-05-07 DIAGNOSIS — I10 Essential (primary) hypertension: Secondary | ICD-10-CM | POA: Diagnosis not present

## 2020-05-07 DIAGNOSIS — E46 Unspecified protein-calorie malnutrition: Secondary | ICD-10-CM | POA: Diagnosis not present

## 2020-05-07 DIAGNOSIS — L89153 Pressure ulcer of sacral region, stage 3: Secondary | ICD-10-CM | POA: Diagnosis not present

## 2020-05-07 DIAGNOSIS — E785 Hyperlipidemia, unspecified: Secondary | ICD-10-CM | POA: Diagnosis not present

## 2020-05-07 DIAGNOSIS — D649 Anemia, unspecified: Secondary | ICD-10-CM | POA: Diagnosis not present

## 2020-05-07 DIAGNOSIS — G47419 Narcolepsy without cataplexy: Secondary | ICD-10-CM | POA: Diagnosis not present

## 2020-05-07 DIAGNOSIS — G35 Multiple sclerosis: Secondary | ICD-10-CM | POA: Diagnosis not present

## 2020-05-07 DIAGNOSIS — J449 Chronic obstructive pulmonary disease, unspecified: Secondary | ICD-10-CM | POA: Diagnosis not present

## 2020-05-07 DIAGNOSIS — Z681 Body mass index (BMI) 19 or less, adult: Secondary | ICD-10-CM | POA: Diagnosis not present

## 2020-05-08 ENCOUNTER — Other Ambulatory Visit (INDEPENDENT_AMBULATORY_CARE_PROVIDER_SITE_OTHER): Payer: Self-pay | Admitting: Internal Medicine

## 2020-05-08 DIAGNOSIS — G35 Multiple sclerosis: Secondary | ICD-10-CM | POA: Diagnosis not present

## 2020-05-08 DIAGNOSIS — R296 Repeated falls: Secondary | ICD-10-CM | POA: Diagnosis not present

## 2020-05-08 DIAGNOSIS — Z681 Body mass index (BMI) 19 or less, adult: Secondary | ICD-10-CM | POA: Diagnosis not present

## 2020-05-08 DIAGNOSIS — J449 Chronic obstructive pulmonary disease, unspecified: Secondary | ICD-10-CM | POA: Diagnosis not present

## 2020-05-08 DIAGNOSIS — L89153 Pressure ulcer of sacral region, stage 3: Secondary | ICD-10-CM | POA: Diagnosis not present

## 2020-05-08 DIAGNOSIS — I10 Essential (primary) hypertension: Secondary | ICD-10-CM | POA: Diagnosis not present

## 2020-05-08 DIAGNOSIS — D649 Anemia, unspecified: Secondary | ICD-10-CM | POA: Diagnosis not present

## 2020-05-08 DIAGNOSIS — E785 Hyperlipidemia, unspecified: Secondary | ICD-10-CM | POA: Diagnosis not present

## 2020-05-08 DIAGNOSIS — Z8614 Personal history of Methicillin resistant Staphylococcus aureus infection: Secondary | ICD-10-CM | POA: Diagnosis not present

## 2020-05-08 DIAGNOSIS — E46 Unspecified protein-calorie malnutrition: Secondary | ICD-10-CM | POA: Diagnosis not present

## 2020-05-08 DIAGNOSIS — G47419 Narcolepsy without cataplexy: Secondary | ICD-10-CM | POA: Diagnosis not present

## 2020-05-09 DIAGNOSIS — R296 Repeated falls: Secondary | ICD-10-CM | POA: Diagnosis not present

## 2020-05-09 DIAGNOSIS — E46 Unspecified protein-calorie malnutrition: Secondary | ICD-10-CM | POA: Diagnosis not present

## 2020-05-09 DIAGNOSIS — J449 Chronic obstructive pulmonary disease, unspecified: Secondary | ICD-10-CM | POA: Diagnosis not present

## 2020-05-09 DIAGNOSIS — D649 Anemia, unspecified: Secondary | ICD-10-CM | POA: Diagnosis not present

## 2020-05-09 DIAGNOSIS — Z8614 Personal history of Methicillin resistant Staphylococcus aureus infection: Secondary | ICD-10-CM | POA: Diagnosis not present

## 2020-05-09 DIAGNOSIS — E785 Hyperlipidemia, unspecified: Secondary | ICD-10-CM | POA: Diagnosis not present

## 2020-05-09 DIAGNOSIS — Z681 Body mass index (BMI) 19 or less, adult: Secondary | ICD-10-CM | POA: Diagnosis not present

## 2020-05-09 DIAGNOSIS — G35 Multiple sclerosis: Secondary | ICD-10-CM | POA: Diagnosis not present

## 2020-05-09 DIAGNOSIS — L89153 Pressure ulcer of sacral region, stage 3: Secondary | ICD-10-CM | POA: Diagnosis not present

## 2020-05-09 DIAGNOSIS — I10 Essential (primary) hypertension: Secondary | ICD-10-CM | POA: Diagnosis not present

## 2020-05-09 DIAGNOSIS — G47419 Narcolepsy without cataplexy: Secondary | ICD-10-CM | POA: Diagnosis not present

## 2020-05-10 DIAGNOSIS — E785 Hyperlipidemia, unspecified: Secondary | ICD-10-CM | POA: Diagnosis not present

## 2020-05-10 DIAGNOSIS — G47419 Narcolepsy without cataplexy: Secondary | ICD-10-CM | POA: Diagnosis not present

## 2020-05-10 DIAGNOSIS — I10 Essential (primary) hypertension: Secondary | ICD-10-CM | POA: Diagnosis not present

## 2020-05-10 DIAGNOSIS — G35 Multiple sclerosis: Secondary | ICD-10-CM | POA: Diagnosis not present

## 2020-05-10 DIAGNOSIS — D649 Anemia, unspecified: Secondary | ICD-10-CM | POA: Diagnosis not present

## 2020-05-10 DIAGNOSIS — Z8614 Personal history of Methicillin resistant Staphylococcus aureus infection: Secondary | ICD-10-CM | POA: Diagnosis not present

## 2020-05-10 DIAGNOSIS — J449 Chronic obstructive pulmonary disease, unspecified: Secondary | ICD-10-CM | POA: Diagnosis not present

## 2020-05-10 DIAGNOSIS — L89153 Pressure ulcer of sacral region, stage 3: Secondary | ICD-10-CM | POA: Diagnosis not present

## 2020-05-10 DIAGNOSIS — Z681 Body mass index (BMI) 19 or less, adult: Secondary | ICD-10-CM | POA: Diagnosis not present

## 2020-05-10 DIAGNOSIS — R296 Repeated falls: Secondary | ICD-10-CM | POA: Diagnosis not present

## 2020-05-10 DIAGNOSIS — E46 Unspecified protein-calorie malnutrition: Secondary | ICD-10-CM | POA: Diagnosis not present

## 2020-05-11 DIAGNOSIS — R296 Repeated falls: Secondary | ICD-10-CM | POA: Diagnosis not present

## 2020-05-11 DIAGNOSIS — G35 Multiple sclerosis: Secondary | ICD-10-CM | POA: Diagnosis not present

## 2020-05-11 DIAGNOSIS — L89153 Pressure ulcer of sacral region, stage 3: Secondary | ICD-10-CM | POA: Diagnosis not present

## 2020-05-11 DIAGNOSIS — G47419 Narcolepsy without cataplexy: Secondary | ICD-10-CM | POA: Diagnosis not present

## 2020-05-11 DIAGNOSIS — E785 Hyperlipidemia, unspecified: Secondary | ICD-10-CM | POA: Diagnosis not present

## 2020-05-11 DIAGNOSIS — J449 Chronic obstructive pulmonary disease, unspecified: Secondary | ICD-10-CM | POA: Diagnosis not present

## 2020-05-11 DIAGNOSIS — Z8614 Personal history of Methicillin resistant Staphylococcus aureus infection: Secondary | ICD-10-CM | POA: Diagnosis not present

## 2020-05-11 DIAGNOSIS — Z681 Body mass index (BMI) 19 or less, adult: Secondary | ICD-10-CM | POA: Diagnosis not present

## 2020-05-11 DIAGNOSIS — E46 Unspecified protein-calorie malnutrition: Secondary | ICD-10-CM | POA: Diagnosis not present

## 2020-05-11 DIAGNOSIS — D649 Anemia, unspecified: Secondary | ICD-10-CM | POA: Diagnosis not present

## 2020-05-11 DIAGNOSIS — I10 Essential (primary) hypertension: Secondary | ICD-10-CM | POA: Diagnosis not present

## 2020-05-12 DIAGNOSIS — R296 Repeated falls: Secondary | ICD-10-CM | POA: Diagnosis not present

## 2020-05-12 DIAGNOSIS — I10 Essential (primary) hypertension: Secondary | ICD-10-CM | POA: Diagnosis not present

## 2020-05-12 DIAGNOSIS — E46 Unspecified protein-calorie malnutrition: Secondary | ICD-10-CM | POA: Diagnosis not present

## 2020-05-12 DIAGNOSIS — J449 Chronic obstructive pulmonary disease, unspecified: Secondary | ICD-10-CM | POA: Diagnosis not present

## 2020-05-12 DIAGNOSIS — G47419 Narcolepsy without cataplexy: Secondary | ICD-10-CM | POA: Diagnosis not present

## 2020-05-12 DIAGNOSIS — L89153 Pressure ulcer of sacral region, stage 3: Secondary | ICD-10-CM | POA: Diagnosis not present

## 2020-05-12 DIAGNOSIS — Z8614 Personal history of Methicillin resistant Staphylococcus aureus infection: Secondary | ICD-10-CM | POA: Diagnosis not present

## 2020-05-12 DIAGNOSIS — E785 Hyperlipidemia, unspecified: Secondary | ICD-10-CM | POA: Diagnosis not present

## 2020-05-12 DIAGNOSIS — G35 Multiple sclerosis: Secondary | ICD-10-CM | POA: Diagnosis not present

## 2020-05-12 DIAGNOSIS — Z681 Body mass index (BMI) 19 or less, adult: Secondary | ICD-10-CM | POA: Diagnosis not present

## 2020-05-12 DIAGNOSIS — D649 Anemia, unspecified: Secondary | ICD-10-CM | POA: Diagnosis not present

## 2020-05-13 DIAGNOSIS — I10 Essential (primary) hypertension: Secondary | ICD-10-CM | POA: Diagnosis not present

## 2020-05-13 DIAGNOSIS — D649 Anemia, unspecified: Secondary | ICD-10-CM | POA: Diagnosis not present

## 2020-05-13 DIAGNOSIS — E46 Unspecified protein-calorie malnutrition: Secondary | ICD-10-CM | POA: Diagnosis not present

## 2020-05-13 DIAGNOSIS — E785 Hyperlipidemia, unspecified: Secondary | ICD-10-CM | POA: Diagnosis not present

## 2020-05-13 DIAGNOSIS — Z8614 Personal history of Methicillin resistant Staphylococcus aureus infection: Secondary | ICD-10-CM | POA: Diagnosis not present

## 2020-05-13 DIAGNOSIS — J449 Chronic obstructive pulmonary disease, unspecified: Secondary | ICD-10-CM | POA: Diagnosis not present

## 2020-05-13 DIAGNOSIS — G47419 Narcolepsy without cataplexy: Secondary | ICD-10-CM | POA: Diagnosis not present

## 2020-05-13 DIAGNOSIS — R296 Repeated falls: Secondary | ICD-10-CM | POA: Diagnosis not present

## 2020-05-13 DIAGNOSIS — Z681 Body mass index (BMI) 19 or less, adult: Secondary | ICD-10-CM | POA: Diagnosis not present

## 2020-05-13 DIAGNOSIS — G35 Multiple sclerosis: Secondary | ICD-10-CM | POA: Diagnosis not present

## 2020-05-13 DIAGNOSIS — L89153 Pressure ulcer of sacral region, stage 3: Secondary | ICD-10-CM | POA: Diagnosis not present

## 2020-05-14 DIAGNOSIS — I10 Essential (primary) hypertension: Secondary | ICD-10-CM | POA: Diagnosis not present

## 2020-05-14 DIAGNOSIS — Z8614 Personal history of Methicillin resistant Staphylococcus aureus infection: Secondary | ICD-10-CM | POA: Diagnosis not present

## 2020-05-14 DIAGNOSIS — G47419 Narcolepsy without cataplexy: Secondary | ICD-10-CM | POA: Diagnosis not present

## 2020-05-14 DIAGNOSIS — L89153 Pressure ulcer of sacral region, stage 3: Secondary | ICD-10-CM | POA: Diagnosis not present

## 2020-05-14 DIAGNOSIS — G35 Multiple sclerosis: Secondary | ICD-10-CM | POA: Diagnosis not present

## 2020-05-14 DIAGNOSIS — D649 Anemia, unspecified: Secondary | ICD-10-CM | POA: Diagnosis not present

## 2020-05-14 DIAGNOSIS — Z681 Body mass index (BMI) 19 or less, adult: Secondary | ICD-10-CM | POA: Diagnosis not present

## 2020-05-14 DIAGNOSIS — R296 Repeated falls: Secondary | ICD-10-CM | POA: Diagnosis not present

## 2020-05-14 DIAGNOSIS — J449 Chronic obstructive pulmonary disease, unspecified: Secondary | ICD-10-CM | POA: Diagnosis not present

## 2020-05-14 DIAGNOSIS — E46 Unspecified protein-calorie malnutrition: Secondary | ICD-10-CM | POA: Diagnosis not present

## 2020-05-14 DIAGNOSIS — E785 Hyperlipidemia, unspecified: Secondary | ICD-10-CM | POA: Diagnosis not present

## 2020-05-15 DIAGNOSIS — Z8614 Personal history of Methicillin resistant Staphylococcus aureus infection: Secondary | ICD-10-CM | POA: Diagnosis not present

## 2020-05-15 DIAGNOSIS — L89153 Pressure ulcer of sacral region, stage 3: Secondary | ICD-10-CM | POA: Diagnosis not present

## 2020-05-15 DIAGNOSIS — G35 Multiple sclerosis: Secondary | ICD-10-CM | POA: Diagnosis not present

## 2020-05-15 DIAGNOSIS — J449 Chronic obstructive pulmonary disease, unspecified: Secondary | ICD-10-CM | POA: Diagnosis not present

## 2020-05-15 DIAGNOSIS — I10 Essential (primary) hypertension: Secondary | ICD-10-CM | POA: Diagnosis not present

## 2020-05-15 DIAGNOSIS — E46 Unspecified protein-calorie malnutrition: Secondary | ICD-10-CM | POA: Diagnosis not present

## 2020-05-15 DIAGNOSIS — R296 Repeated falls: Secondary | ICD-10-CM | POA: Diagnosis not present

## 2020-05-15 DIAGNOSIS — G47419 Narcolepsy without cataplexy: Secondary | ICD-10-CM | POA: Diagnosis not present

## 2020-05-15 DIAGNOSIS — E785 Hyperlipidemia, unspecified: Secondary | ICD-10-CM | POA: Diagnosis not present

## 2020-05-15 DIAGNOSIS — Z681 Body mass index (BMI) 19 or less, adult: Secondary | ICD-10-CM | POA: Diagnosis not present

## 2020-05-15 DIAGNOSIS — D649 Anemia, unspecified: Secondary | ICD-10-CM | POA: Diagnosis not present

## 2020-05-16 DIAGNOSIS — G35 Multiple sclerosis: Secondary | ICD-10-CM | POA: Diagnosis not present

## 2020-05-16 DIAGNOSIS — Z681 Body mass index (BMI) 19 or less, adult: Secondary | ICD-10-CM | POA: Diagnosis not present

## 2020-05-16 DIAGNOSIS — D649 Anemia, unspecified: Secondary | ICD-10-CM | POA: Diagnosis not present

## 2020-05-16 DIAGNOSIS — J449 Chronic obstructive pulmonary disease, unspecified: Secondary | ICD-10-CM | POA: Diagnosis not present

## 2020-05-16 DIAGNOSIS — E46 Unspecified protein-calorie malnutrition: Secondary | ICD-10-CM | POA: Diagnosis not present

## 2020-05-16 DIAGNOSIS — E785 Hyperlipidemia, unspecified: Secondary | ICD-10-CM | POA: Diagnosis not present

## 2020-05-16 DIAGNOSIS — Z8614 Personal history of Methicillin resistant Staphylococcus aureus infection: Secondary | ICD-10-CM | POA: Diagnosis not present

## 2020-05-16 DIAGNOSIS — L89153 Pressure ulcer of sacral region, stage 3: Secondary | ICD-10-CM | POA: Diagnosis not present

## 2020-05-16 DIAGNOSIS — I10 Essential (primary) hypertension: Secondary | ICD-10-CM | POA: Diagnosis not present

## 2020-05-16 DIAGNOSIS — G47419 Narcolepsy without cataplexy: Secondary | ICD-10-CM | POA: Diagnosis not present

## 2020-05-16 DIAGNOSIS — R296 Repeated falls: Secondary | ICD-10-CM | POA: Diagnosis not present

## 2020-05-17 DIAGNOSIS — J449 Chronic obstructive pulmonary disease, unspecified: Secondary | ICD-10-CM | POA: Diagnosis not present

## 2020-05-17 DIAGNOSIS — G47419 Narcolepsy without cataplexy: Secondary | ICD-10-CM | POA: Diagnosis not present

## 2020-05-17 DIAGNOSIS — G35 Multiple sclerosis: Secondary | ICD-10-CM | POA: Diagnosis not present

## 2020-05-17 DIAGNOSIS — E46 Unspecified protein-calorie malnutrition: Secondary | ICD-10-CM | POA: Diagnosis not present

## 2020-05-17 DIAGNOSIS — E785 Hyperlipidemia, unspecified: Secondary | ICD-10-CM | POA: Diagnosis not present

## 2020-05-17 DIAGNOSIS — Z8614 Personal history of Methicillin resistant Staphylococcus aureus infection: Secondary | ICD-10-CM | POA: Diagnosis not present

## 2020-05-17 DIAGNOSIS — L89153 Pressure ulcer of sacral region, stage 3: Secondary | ICD-10-CM | POA: Diagnosis not present

## 2020-05-17 DIAGNOSIS — I10 Essential (primary) hypertension: Secondary | ICD-10-CM | POA: Diagnosis not present

## 2020-05-17 DIAGNOSIS — R296 Repeated falls: Secondary | ICD-10-CM | POA: Diagnosis not present

## 2020-05-17 DIAGNOSIS — D649 Anemia, unspecified: Secondary | ICD-10-CM | POA: Diagnosis not present

## 2020-05-17 DIAGNOSIS — Z681 Body mass index (BMI) 19 or less, adult: Secondary | ICD-10-CM | POA: Diagnosis not present

## 2020-05-18 DIAGNOSIS — Z681 Body mass index (BMI) 19 or less, adult: Secondary | ICD-10-CM | POA: Diagnosis not present

## 2020-05-18 DIAGNOSIS — Z8614 Personal history of Methicillin resistant Staphylococcus aureus infection: Secondary | ICD-10-CM | POA: Diagnosis not present

## 2020-05-18 DIAGNOSIS — D649 Anemia, unspecified: Secondary | ICD-10-CM | POA: Diagnosis not present

## 2020-05-18 DIAGNOSIS — L89153 Pressure ulcer of sacral region, stage 3: Secondary | ICD-10-CM | POA: Diagnosis not present

## 2020-05-18 DIAGNOSIS — E785 Hyperlipidemia, unspecified: Secondary | ICD-10-CM | POA: Diagnosis not present

## 2020-05-18 DIAGNOSIS — R296 Repeated falls: Secondary | ICD-10-CM | POA: Diagnosis not present

## 2020-05-18 DIAGNOSIS — G35 Multiple sclerosis: Secondary | ICD-10-CM | POA: Diagnosis not present

## 2020-05-18 DIAGNOSIS — E46 Unspecified protein-calorie malnutrition: Secondary | ICD-10-CM | POA: Diagnosis not present

## 2020-05-18 DIAGNOSIS — J449 Chronic obstructive pulmonary disease, unspecified: Secondary | ICD-10-CM | POA: Diagnosis not present

## 2020-05-18 DIAGNOSIS — G47419 Narcolepsy without cataplexy: Secondary | ICD-10-CM | POA: Diagnosis not present

## 2020-05-18 DIAGNOSIS — I10 Essential (primary) hypertension: Secondary | ICD-10-CM | POA: Diagnosis not present

## 2020-05-19 DIAGNOSIS — E785 Hyperlipidemia, unspecified: Secondary | ICD-10-CM | POA: Diagnosis not present

## 2020-05-19 DIAGNOSIS — Z8614 Personal history of Methicillin resistant Staphylococcus aureus infection: Secondary | ICD-10-CM | POA: Diagnosis not present

## 2020-05-19 DIAGNOSIS — I10 Essential (primary) hypertension: Secondary | ICD-10-CM | POA: Diagnosis not present

## 2020-05-19 DIAGNOSIS — G47419 Narcolepsy without cataplexy: Secondary | ICD-10-CM | POA: Diagnosis not present

## 2020-05-19 DIAGNOSIS — R296 Repeated falls: Secondary | ICD-10-CM | POA: Diagnosis not present

## 2020-05-19 DIAGNOSIS — D649 Anemia, unspecified: Secondary | ICD-10-CM | POA: Diagnosis not present

## 2020-05-19 DIAGNOSIS — G35 Multiple sclerosis: Secondary | ICD-10-CM | POA: Diagnosis not present

## 2020-05-19 DIAGNOSIS — E46 Unspecified protein-calorie malnutrition: Secondary | ICD-10-CM | POA: Diagnosis not present

## 2020-05-19 DIAGNOSIS — L89153 Pressure ulcer of sacral region, stage 3: Secondary | ICD-10-CM | POA: Diagnosis not present

## 2020-05-19 DIAGNOSIS — Z681 Body mass index (BMI) 19 or less, adult: Secondary | ICD-10-CM | POA: Diagnosis not present

## 2020-05-19 DIAGNOSIS — J449 Chronic obstructive pulmonary disease, unspecified: Secondary | ICD-10-CM | POA: Diagnosis not present

## 2020-05-20 ENCOUNTER — Other Ambulatory Visit (INDEPENDENT_AMBULATORY_CARE_PROVIDER_SITE_OTHER): Payer: Self-pay | Admitting: Internal Medicine

## 2020-05-20 ENCOUNTER — Other Ambulatory Visit: Payer: Self-pay | Admitting: *Deleted

## 2020-05-20 DIAGNOSIS — J449 Chronic obstructive pulmonary disease, unspecified: Secondary | ICD-10-CM | POA: Diagnosis not present

## 2020-05-20 DIAGNOSIS — R296 Repeated falls: Secondary | ICD-10-CM | POA: Diagnosis not present

## 2020-05-20 DIAGNOSIS — Z681 Body mass index (BMI) 19 or less, adult: Secondary | ICD-10-CM | POA: Diagnosis not present

## 2020-05-20 DIAGNOSIS — Z8614 Personal history of Methicillin resistant Staphylococcus aureus infection: Secondary | ICD-10-CM | POA: Diagnosis not present

## 2020-05-20 DIAGNOSIS — G47419 Narcolepsy without cataplexy: Secondary | ICD-10-CM | POA: Diagnosis not present

## 2020-05-20 DIAGNOSIS — L89153 Pressure ulcer of sacral region, stage 3: Secondary | ICD-10-CM | POA: Diagnosis not present

## 2020-05-20 DIAGNOSIS — D649 Anemia, unspecified: Secondary | ICD-10-CM | POA: Diagnosis not present

## 2020-05-20 DIAGNOSIS — G35 Multiple sclerosis: Secondary | ICD-10-CM | POA: Diagnosis not present

## 2020-05-20 DIAGNOSIS — I10 Essential (primary) hypertension: Secondary | ICD-10-CM | POA: Diagnosis not present

## 2020-05-20 DIAGNOSIS — E46 Unspecified protein-calorie malnutrition: Secondary | ICD-10-CM | POA: Diagnosis not present

## 2020-05-20 DIAGNOSIS — E785 Hyperlipidemia, unspecified: Secondary | ICD-10-CM | POA: Diagnosis not present

## 2020-05-20 NOTE — Patient Outreach (Signed)
Care Coordination  05/20/2020  Faith [name] Jan 11, 1956 [id number]    Medicaid Managed Care   Unsuccessful Outreach Note  05/20/2020 Name: Faith [name] MRN: [id number] DOB: July 31, 1955  Referred by: Doree Albee, MD Reason for referral : High Risk Managed Medicaid (Unsuccessful RNCM initial outreach)   An unsuccessful telephone outreach was attempted today. The patient was referred to the case management team for assistance with care management and care coordination.   Follow Up Plan: A HIPAA compliant phone message was left for the patient providing contact information and requesting a return call.  The care management team will reach out to the patient again over the next 7-14 days.   Lurena Joiner RN, BSN [place]  [employer] manager

## 2020-05-20 NOTE — Patient Instructions (Signed)
Visit Information  Ms. Faith [name]  - as a part of your Medicaid benefit, you are eligible for care management and care coordination services at no cost or copay. I was unable to reach you by phone today but would be happy to help you with your health related needs. Please feel free to call me @ [phone].   A member of the Managed Medicaid care management team will reach out to you again over the next 7-14 days.   Lurena Joiner RN, BSN [place]  [employer] manager

## 2020-05-21 DIAGNOSIS — D649 Anemia, unspecified: Secondary | ICD-10-CM | POA: Diagnosis not present

## 2020-05-21 DIAGNOSIS — G47419 Narcolepsy without cataplexy: Secondary | ICD-10-CM | POA: Diagnosis not present

## 2020-05-21 DIAGNOSIS — Z8614 Personal history of Methicillin resistant Staphylococcus aureus infection: Secondary | ICD-10-CM | POA: Diagnosis not present

## 2020-05-21 DIAGNOSIS — R296 Repeated falls: Secondary | ICD-10-CM | POA: Diagnosis not present

## 2020-05-21 DIAGNOSIS — E46 Unspecified protein-calorie malnutrition: Secondary | ICD-10-CM | POA: Diagnosis not present

## 2020-05-21 DIAGNOSIS — I10 Essential (primary) hypertension: Secondary | ICD-10-CM | POA: Diagnosis not present

## 2020-05-21 DIAGNOSIS — J449 Chronic obstructive pulmonary disease, unspecified: Secondary | ICD-10-CM | POA: Diagnosis not present

## 2020-05-21 DIAGNOSIS — L89153 Pressure ulcer of sacral region, stage 3: Secondary | ICD-10-CM | POA: Diagnosis not present

## 2020-05-21 DIAGNOSIS — Z681 Body mass index (BMI) 19 or less, adult: Secondary | ICD-10-CM | POA: Diagnosis not present

## 2020-05-21 DIAGNOSIS — G35 Multiple sclerosis: Secondary | ICD-10-CM | POA: Diagnosis not present

## 2020-05-21 DIAGNOSIS — E785 Hyperlipidemia, unspecified: Secondary | ICD-10-CM | POA: Diagnosis not present

## 2020-05-22 DIAGNOSIS — E785 Hyperlipidemia, unspecified: Secondary | ICD-10-CM | POA: Diagnosis not present

## 2020-05-22 DIAGNOSIS — R296 Repeated falls: Secondary | ICD-10-CM | POA: Diagnosis not present

## 2020-05-22 DIAGNOSIS — D649 Anemia, unspecified: Secondary | ICD-10-CM | POA: Diagnosis not present

## 2020-05-22 DIAGNOSIS — G47419 Narcolepsy without cataplexy: Secondary | ICD-10-CM | POA: Diagnosis not present

## 2020-05-22 DIAGNOSIS — Z681 Body mass index (BMI) 19 or less, adult: Secondary | ICD-10-CM | POA: Diagnosis not present

## 2020-05-22 DIAGNOSIS — I10 Essential (primary) hypertension: Secondary | ICD-10-CM | POA: Diagnosis not present

## 2020-05-22 DIAGNOSIS — J449 Chronic obstructive pulmonary disease, unspecified: Secondary | ICD-10-CM | POA: Diagnosis not present

## 2020-05-22 DIAGNOSIS — Z8614 Personal history of Methicillin resistant Staphylococcus aureus infection: Secondary | ICD-10-CM | POA: Diagnosis not present

## 2020-05-22 DIAGNOSIS — E46 Unspecified protein-calorie malnutrition: Secondary | ICD-10-CM | POA: Diagnosis not present

## 2020-05-22 DIAGNOSIS — G35 Multiple sclerosis: Secondary | ICD-10-CM | POA: Diagnosis not present

## 2020-05-22 DIAGNOSIS — L89153 Pressure ulcer of sacral region, stage 3: Secondary | ICD-10-CM | POA: Diagnosis not present

## 2020-05-23 DIAGNOSIS — I10 Essential (primary) hypertension: Secondary | ICD-10-CM | POA: Diagnosis not present

## 2020-05-23 DIAGNOSIS — E46 Unspecified protein-calorie malnutrition: Secondary | ICD-10-CM | POA: Diagnosis not present

## 2020-05-23 DIAGNOSIS — L89153 Pressure ulcer of sacral region, stage 3: Secondary | ICD-10-CM | POA: Diagnosis not present

## 2020-05-23 DIAGNOSIS — Z681 Body mass index (BMI) 19 or less, adult: Secondary | ICD-10-CM | POA: Diagnosis not present

## 2020-05-23 DIAGNOSIS — D649 Anemia, unspecified: Secondary | ICD-10-CM | POA: Diagnosis not present

## 2020-05-23 DIAGNOSIS — Z8614 Personal history of Methicillin resistant Staphylococcus aureus infection: Secondary | ICD-10-CM | POA: Diagnosis not present

## 2020-05-23 DIAGNOSIS — R296 Repeated falls: Secondary | ICD-10-CM | POA: Diagnosis not present

## 2020-05-23 DIAGNOSIS — J449 Chronic obstructive pulmonary disease, unspecified: Secondary | ICD-10-CM | POA: Diagnosis not present

## 2020-05-23 DIAGNOSIS — G35 Multiple sclerosis: Secondary | ICD-10-CM | POA: Diagnosis not present

## 2020-05-23 DIAGNOSIS — G47419 Narcolepsy without cataplexy: Secondary | ICD-10-CM | POA: Diagnosis not present

## 2020-05-23 DIAGNOSIS — E785 Hyperlipidemia, unspecified: Secondary | ICD-10-CM | POA: Diagnosis not present

## 2020-05-24 DIAGNOSIS — E46 Unspecified protein-calorie malnutrition: Secondary | ICD-10-CM | POA: Diagnosis not present

## 2020-05-24 DIAGNOSIS — Z681 Body mass index (BMI) 19 or less, adult: Secondary | ICD-10-CM | POA: Diagnosis not present

## 2020-05-24 DIAGNOSIS — E785 Hyperlipidemia, unspecified: Secondary | ICD-10-CM | POA: Diagnosis not present

## 2020-05-24 DIAGNOSIS — J449 Chronic obstructive pulmonary disease, unspecified: Secondary | ICD-10-CM | POA: Diagnosis not present

## 2020-05-24 DIAGNOSIS — G35 Multiple sclerosis: Secondary | ICD-10-CM | POA: Diagnosis not present

## 2020-05-24 DIAGNOSIS — G47419 Narcolepsy without cataplexy: Secondary | ICD-10-CM | POA: Diagnosis not present

## 2020-05-24 DIAGNOSIS — R296 Repeated falls: Secondary | ICD-10-CM | POA: Diagnosis not present

## 2020-05-24 DIAGNOSIS — Z8614 Personal history of Methicillin resistant Staphylococcus aureus infection: Secondary | ICD-10-CM | POA: Diagnosis not present

## 2020-05-24 DIAGNOSIS — L89153 Pressure ulcer of sacral region, stage 3: Secondary | ICD-10-CM | POA: Diagnosis not present

## 2020-05-24 DIAGNOSIS — D649 Anemia, unspecified: Secondary | ICD-10-CM | POA: Diagnosis not present

## 2020-05-24 DIAGNOSIS — I10 Essential (primary) hypertension: Secondary | ICD-10-CM | POA: Diagnosis not present

## 2020-05-25 DIAGNOSIS — L89153 Pressure ulcer of sacral region, stage 3: Secondary | ICD-10-CM | POA: Diagnosis not present

## 2020-05-25 DIAGNOSIS — R296 Repeated falls: Secondary | ICD-10-CM | POA: Diagnosis not present

## 2020-05-25 DIAGNOSIS — D649 Anemia, unspecified: Secondary | ICD-10-CM | POA: Diagnosis not present

## 2020-05-25 DIAGNOSIS — G47419 Narcolepsy without cataplexy: Secondary | ICD-10-CM | POA: Diagnosis not present

## 2020-05-25 DIAGNOSIS — E785 Hyperlipidemia, unspecified: Secondary | ICD-10-CM | POA: Diagnosis not present

## 2020-05-25 DIAGNOSIS — J449 Chronic obstructive pulmonary disease, unspecified: Secondary | ICD-10-CM | POA: Diagnosis not present

## 2020-05-25 DIAGNOSIS — G35 Multiple sclerosis: Secondary | ICD-10-CM | POA: Diagnosis not present

## 2020-05-25 DIAGNOSIS — I10 Essential (primary) hypertension: Secondary | ICD-10-CM | POA: Diagnosis not present

## 2020-05-25 DIAGNOSIS — Z681 Body mass index (BMI) 19 or less, adult: Secondary | ICD-10-CM | POA: Diagnosis not present

## 2020-05-25 DIAGNOSIS — Z8614 Personal history of Methicillin resistant Staphylococcus aureus infection: Secondary | ICD-10-CM | POA: Diagnosis not present

## 2020-05-25 DIAGNOSIS — E46 Unspecified protein-calorie malnutrition: Secondary | ICD-10-CM | POA: Diagnosis not present

## 2020-05-26 DIAGNOSIS — Z8614 Personal history of Methicillin resistant Staphylococcus aureus infection: Secondary | ICD-10-CM | POA: Diagnosis not present

## 2020-05-26 DIAGNOSIS — E46 Unspecified protein-calorie malnutrition: Secondary | ICD-10-CM | POA: Diagnosis not present

## 2020-05-26 DIAGNOSIS — Z681 Body mass index (BMI) 19 or less, adult: Secondary | ICD-10-CM | POA: Diagnosis not present

## 2020-05-26 DIAGNOSIS — L89153 Pressure ulcer of sacral region, stage 3: Secondary | ICD-10-CM | POA: Diagnosis not present

## 2020-05-26 DIAGNOSIS — I10 Essential (primary) hypertension: Secondary | ICD-10-CM | POA: Diagnosis not present

## 2020-05-26 DIAGNOSIS — G47419 Narcolepsy without cataplexy: Secondary | ICD-10-CM | POA: Diagnosis not present

## 2020-05-26 DIAGNOSIS — J449 Chronic obstructive pulmonary disease, unspecified: Secondary | ICD-10-CM | POA: Diagnosis not present

## 2020-05-26 DIAGNOSIS — E785 Hyperlipidemia, unspecified: Secondary | ICD-10-CM | POA: Diagnosis not present

## 2020-05-26 DIAGNOSIS — R296 Repeated falls: Secondary | ICD-10-CM | POA: Diagnosis not present

## 2020-05-26 DIAGNOSIS — G35 Multiple sclerosis: Secondary | ICD-10-CM | POA: Diagnosis not present

## 2020-05-26 DIAGNOSIS — D649 Anemia, unspecified: Secondary | ICD-10-CM | POA: Diagnosis not present

## 2020-05-27 DIAGNOSIS — L89153 Pressure ulcer of sacral region, stage 3: Secondary | ICD-10-CM | POA: Diagnosis not present

## 2020-05-27 DIAGNOSIS — Z681 Body mass index (BMI) 19 or less, adult: Secondary | ICD-10-CM | POA: Diagnosis not present

## 2020-05-27 DIAGNOSIS — E785 Hyperlipidemia, unspecified: Secondary | ICD-10-CM | POA: Diagnosis not present

## 2020-05-27 DIAGNOSIS — R296 Repeated falls: Secondary | ICD-10-CM | POA: Diagnosis not present

## 2020-05-27 DIAGNOSIS — E46 Unspecified protein-calorie malnutrition: Secondary | ICD-10-CM | POA: Diagnosis not present

## 2020-05-27 DIAGNOSIS — Z8614 Personal history of Methicillin resistant Staphylococcus aureus infection: Secondary | ICD-10-CM | POA: Diagnosis not present

## 2020-05-27 DIAGNOSIS — G35 Multiple sclerosis: Secondary | ICD-10-CM | POA: Diagnosis not present

## 2020-05-27 DIAGNOSIS — J449 Chronic obstructive pulmonary disease, unspecified: Secondary | ICD-10-CM | POA: Diagnosis not present

## 2020-05-27 DIAGNOSIS — I10 Essential (primary) hypertension: Secondary | ICD-10-CM | POA: Diagnosis not present

## 2020-05-27 DIAGNOSIS — G47419 Narcolepsy without cataplexy: Secondary | ICD-10-CM | POA: Diagnosis not present

## 2020-05-27 DIAGNOSIS — D649 Anemia, unspecified: Secondary | ICD-10-CM | POA: Diagnosis not present

## 2020-05-28 DIAGNOSIS — Z8614 Personal history of Methicillin resistant Staphylococcus aureus infection: Secondary | ICD-10-CM | POA: Diagnosis not present

## 2020-05-28 DIAGNOSIS — E785 Hyperlipidemia, unspecified: Secondary | ICD-10-CM | POA: Diagnosis not present

## 2020-05-28 DIAGNOSIS — Z681 Body mass index (BMI) 19 or less, adult: Secondary | ICD-10-CM | POA: Diagnosis not present

## 2020-05-28 DIAGNOSIS — R296 Repeated falls: Secondary | ICD-10-CM | POA: Diagnosis not present

## 2020-05-28 DIAGNOSIS — G47419 Narcolepsy without cataplexy: Secondary | ICD-10-CM | POA: Diagnosis not present

## 2020-05-28 DIAGNOSIS — E46 Unspecified protein-calorie malnutrition: Secondary | ICD-10-CM | POA: Diagnosis not present

## 2020-05-28 DIAGNOSIS — G35 Multiple sclerosis: Secondary | ICD-10-CM | POA: Diagnosis not present

## 2020-05-28 DIAGNOSIS — I10 Essential (primary) hypertension: Secondary | ICD-10-CM | POA: Diagnosis not present

## 2020-05-28 DIAGNOSIS — L89153 Pressure ulcer of sacral region, stage 3: Secondary | ICD-10-CM | POA: Diagnosis not present

## 2020-05-28 DIAGNOSIS — D649 Anemia, unspecified: Secondary | ICD-10-CM | POA: Diagnosis not present

## 2020-05-28 DIAGNOSIS — J449 Chronic obstructive pulmonary disease, unspecified: Secondary | ICD-10-CM | POA: Diagnosis not present

## 2020-05-29 DIAGNOSIS — E785 Hyperlipidemia, unspecified: Secondary | ICD-10-CM | POA: Diagnosis not present

## 2020-05-29 DIAGNOSIS — G35 Multiple sclerosis: Secondary | ICD-10-CM | POA: Diagnosis not present

## 2020-05-29 DIAGNOSIS — L89153 Pressure ulcer of sacral region, stage 3: Secondary | ICD-10-CM | POA: Diagnosis not present

## 2020-05-29 DIAGNOSIS — G47419 Narcolepsy without cataplexy: Secondary | ICD-10-CM | POA: Diagnosis not present

## 2020-05-29 DIAGNOSIS — D649 Anemia, unspecified: Secondary | ICD-10-CM | POA: Diagnosis not present

## 2020-05-29 DIAGNOSIS — R296 Repeated falls: Secondary | ICD-10-CM | POA: Diagnosis not present

## 2020-05-29 DIAGNOSIS — J449 Chronic obstructive pulmonary disease, unspecified: Secondary | ICD-10-CM | POA: Diagnosis not present

## 2020-05-29 DIAGNOSIS — E46 Unspecified protein-calorie malnutrition: Secondary | ICD-10-CM | POA: Diagnosis not present

## 2020-05-29 DIAGNOSIS — I10 Essential (primary) hypertension: Secondary | ICD-10-CM | POA: Diagnosis not present

## 2020-05-29 DIAGNOSIS — Z681 Body mass index (BMI) 19 or less, adult: Secondary | ICD-10-CM | POA: Diagnosis not present

## 2020-05-29 DIAGNOSIS — Z8614 Personal history of Methicillin resistant Staphylococcus aureus infection: Secondary | ICD-10-CM | POA: Diagnosis not present

## 2020-05-30 DIAGNOSIS — E785 Hyperlipidemia, unspecified: Secondary | ICD-10-CM | POA: Diagnosis not present

## 2020-05-30 DIAGNOSIS — E46 Unspecified protein-calorie malnutrition: Secondary | ICD-10-CM | POA: Diagnosis not present

## 2020-05-30 DIAGNOSIS — G47419 Narcolepsy without cataplexy: Secondary | ICD-10-CM | POA: Diagnosis not present

## 2020-05-30 DIAGNOSIS — G35 Multiple sclerosis: Secondary | ICD-10-CM | POA: Diagnosis not present

## 2020-05-30 DIAGNOSIS — Z681 Body mass index (BMI) 19 or less, adult: Secondary | ICD-10-CM | POA: Diagnosis not present

## 2020-05-30 DIAGNOSIS — D649 Anemia, unspecified: Secondary | ICD-10-CM | POA: Diagnosis not present

## 2020-05-30 DIAGNOSIS — I10 Essential (primary) hypertension: Secondary | ICD-10-CM | POA: Diagnosis not present

## 2020-05-30 DIAGNOSIS — J449 Chronic obstructive pulmonary disease, unspecified: Secondary | ICD-10-CM | POA: Diagnosis not present

## 2020-05-30 DIAGNOSIS — Z8614 Personal history of Methicillin resistant Staphylococcus aureus infection: Secondary | ICD-10-CM | POA: Diagnosis not present

## 2020-05-30 DIAGNOSIS — L89153 Pressure ulcer of sacral region, stage 3: Secondary | ICD-10-CM | POA: Diagnosis not present

## 2020-05-30 DIAGNOSIS — R296 Repeated falls: Secondary | ICD-10-CM | POA: Diagnosis not present

## 2020-05-31 DIAGNOSIS — E785 Hyperlipidemia, unspecified: Secondary | ICD-10-CM | POA: Diagnosis not present

## 2020-05-31 DIAGNOSIS — G35 Multiple sclerosis: Secondary | ICD-10-CM | POA: Diagnosis not present

## 2020-05-31 DIAGNOSIS — L89153 Pressure ulcer of sacral region, stage 3: Secondary | ICD-10-CM | POA: Diagnosis not present

## 2020-05-31 DIAGNOSIS — R296 Repeated falls: Secondary | ICD-10-CM | POA: Diagnosis not present

## 2020-05-31 DIAGNOSIS — Z8614 Personal history of Methicillin resistant Staphylococcus aureus infection: Secondary | ICD-10-CM | POA: Diagnosis not present

## 2020-05-31 DIAGNOSIS — D649 Anemia, unspecified: Secondary | ICD-10-CM | POA: Diagnosis not present

## 2020-05-31 DIAGNOSIS — E46 Unspecified protein-calorie malnutrition: Secondary | ICD-10-CM | POA: Diagnosis not present

## 2020-05-31 DIAGNOSIS — Z681 Body mass index (BMI) 19 or less, adult: Secondary | ICD-10-CM | POA: Diagnosis not present

## 2020-05-31 DIAGNOSIS — J449 Chronic obstructive pulmonary disease, unspecified: Secondary | ICD-10-CM | POA: Diagnosis not present

## 2020-05-31 DIAGNOSIS — G47419 Narcolepsy without cataplexy: Secondary | ICD-10-CM | POA: Diagnosis not present

## 2020-05-31 DIAGNOSIS — I10 Essential (primary) hypertension: Secondary | ICD-10-CM | POA: Diagnosis not present

## 2020-06-01 DIAGNOSIS — G35 Multiple sclerosis: Secondary | ICD-10-CM | POA: Diagnosis not present

## 2020-06-01 DIAGNOSIS — E785 Hyperlipidemia, unspecified: Secondary | ICD-10-CM | POA: Diagnosis not present

## 2020-06-01 DIAGNOSIS — Z681 Body mass index (BMI) 19 or less, adult: Secondary | ICD-10-CM | POA: Diagnosis not present

## 2020-06-01 DIAGNOSIS — L89153 Pressure ulcer of sacral region, stage 3: Secondary | ICD-10-CM | POA: Diagnosis not present

## 2020-06-01 DIAGNOSIS — Z8614 Personal history of Methicillin resistant Staphylococcus aureus infection: Secondary | ICD-10-CM | POA: Diagnosis not present

## 2020-06-01 DIAGNOSIS — D649 Anemia, unspecified: Secondary | ICD-10-CM | POA: Diagnosis not present

## 2020-06-01 DIAGNOSIS — I10 Essential (primary) hypertension: Secondary | ICD-10-CM | POA: Diagnosis not present

## 2020-06-01 DIAGNOSIS — G47419 Narcolepsy without cataplexy: Secondary | ICD-10-CM | POA: Diagnosis not present

## 2020-06-01 DIAGNOSIS — E46 Unspecified protein-calorie malnutrition: Secondary | ICD-10-CM | POA: Diagnosis not present

## 2020-06-01 DIAGNOSIS — J449 Chronic obstructive pulmonary disease, unspecified: Secondary | ICD-10-CM | POA: Diagnosis not present

## 2020-06-01 DIAGNOSIS — R296 Repeated falls: Secondary | ICD-10-CM | POA: Diagnosis not present

## 2020-06-02 DIAGNOSIS — D649 Anemia, unspecified: Secondary | ICD-10-CM | POA: Diagnosis not present

## 2020-06-02 DIAGNOSIS — L89153 Pressure ulcer of sacral region, stage 3: Secondary | ICD-10-CM | POA: Diagnosis not present

## 2020-06-02 DIAGNOSIS — Z8614 Personal history of Methicillin resistant Staphylococcus aureus infection: Secondary | ICD-10-CM | POA: Diagnosis not present

## 2020-06-02 DIAGNOSIS — G47419 Narcolepsy without cataplexy: Secondary | ICD-10-CM | POA: Diagnosis not present

## 2020-06-02 DIAGNOSIS — E46 Unspecified protein-calorie malnutrition: Secondary | ICD-10-CM | POA: Diagnosis not present

## 2020-06-02 DIAGNOSIS — G35 Multiple sclerosis: Secondary | ICD-10-CM | POA: Diagnosis not present

## 2020-06-02 DIAGNOSIS — I10 Essential (primary) hypertension: Secondary | ICD-10-CM | POA: Diagnosis not present

## 2020-06-02 DIAGNOSIS — Z681 Body mass index (BMI) 19 or less, adult: Secondary | ICD-10-CM | POA: Diagnosis not present

## 2020-06-02 DIAGNOSIS — J449 Chronic obstructive pulmonary disease, unspecified: Secondary | ICD-10-CM | POA: Diagnosis not present

## 2020-06-02 DIAGNOSIS — R296 Repeated falls: Secondary | ICD-10-CM | POA: Diagnosis not present

## 2020-06-02 DIAGNOSIS — E785 Hyperlipidemia, unspecified: Secondary | ICD-10-CM | POA: Diagnosis not present

## 2020-06-03 DIAGNOSIS — J449 Chronic obstructive pulmonary disease, unspecified: Secondary | ICD-10-CM | POA: Diagnosis not present

## 2020-06-03 DIAGNOSIS — R296 Repeated falls: Secondary | ICD-10-CM | POA: Diagnosis not present

## 2020-06-03 DIAGNOSIS — L89153 Pressure ulcer of sacral region, stage 3: Secondary | ICD-10-CM | POA: Diagnosis not present

## 2020-06-03 DIAGNOSIS — I10 Essential (primary) hypertension: Secondary | ICD-10-CM | POA: Diagnosis not present

## 2020-06-03 DIAGNOSIS — E785 Hyperlipidemia, unspecified: Secondary | ICD-10-CM | POA: Diagnosis not present

## 2020-06-03 DIAGNOSIS — E46 Unspecified protein-calorie malnutrition: Secondary | ICD-10-CM | POA: Diagnosis not present

## 2020-06-03 DIAGNOSIS — G47419 Narcolepsy without cataplexy: Secondary | ICD-10-CM | POA: Diagnosis not present

## 2020-06-03 DIAGNOSIS — Z8614 Personal history of Methicillin resistant Staphylococcus aureus infection: Secondary | ICD-10-CM | POA: Diagnosis not present

## 2020-06-03 DIAGNOSIS — Z681 Body mass index (BMI) 19 or less, adult: Secondary | ICD-10-CM | POA: Diagnosis not present

## 2020-06-03 DIAGNOSIS — D649 Anemia, unspecified: Secondary | ICD-10-CM | POA: Diagnosis not present

## 2020-06-03 DIAGNOSIS — G35 Multiple sclerosis: Secondary | ICD-10-CM | POA: Diagnosis not present

## 2020-06-04 DIAGNOSIS — J449 Chronic obstructive pulmonary disease, unspecified: Secondary | ICD-10-CM | POA: Diagnosis not present

## 2020-06-04 DIAGNOSIS — R296 Repeated falls: Secondary | ICD-10-CM | POA: Diagnosis not present

## 2020-06-04 DIAGNOSIS — E46 Unspecified protein-calorie malnutrition: Secondary | ICD-10-CM | POA: Diagnosis not present

## 2020-06-04 DIAGNOSIS — G35 Multiple sclerosis: Secondary | ICD-10-CM | POA: Diagnosis not present

## 2020-06-04 DIAGNOSIS — L89153 Pressure ulcer of sacral region, stage 3: Secondary | ICD-10-CM | POA: Diagnosis not present

## 2020-06-04 DIAGNOSIS — D649 Anemia, unspecified: Secondary | ICD-10-CM | POA: Diagnosis not present

## 2020-06-04 DIAGNOSIS — E785 Hyperlipidemia, unspecified: Secondary | ICD-10-CM | POA: Diagnosis not present

## 2020-06-04 DIAGNOSIS — Z681 Body mass index (BMI) 19 or less, adult: Secondary | ICD-10-CM | POA: Diagnosis not present

## 2020-06-04 DIAGNOSIS — I10 Essential (primary) hypertension: Secondary | ICD-10-CM | POA: Diagnosis not present

## 2020-06-04 DIAGNOSIS — Z8614 Personal history of Methicillin resistant Staphylococcus aureus infection: Secondary | ICD-10-CM | POA: Diagnosis not present

## 2020-06-04 DIAGNOSIS — G47419 Narcolepsy without cataplexy: Secondary | ICD-10-CM | POA: Diagnosis not present

## 2020-06-05 DIAGNOSIS — D649 Anemia, unspecified: Secondary | ICD-10-CM | POA: Diagnosis not present

## 2020-06-05 DIAGNOSIS — E785 Hyperlipidemia, unspecified: Secondary | ICD-10-CM | POA: Diagnosis not present

## 2020-06-05 DIAGNOSIS — R296 Repeated falls: Secondary | ICD-10-CM | POA: Diagnosis not present

## 2020-06-05 DIAGNOSIS — L89153 Pressure ulcer of sacral region, stage 3: Secondary | ICD-10-CM | POA: Diagnosis not present

## 2020-06-05 DIAGNOSIS — E46 Unspecified protein-calorie malnutrition: Secondary | ICD-10-CM | POA: Diagnosis not present

## 2020-06-05 DIAGNOSIS — G47419 Narcolepsy without cataplexy: Secondary | ICD-10-CM | POA: Diagnosis not present

## 2020-06-05 DIAGNOSIS — G35 Multiple sclerosis: Secondary | ICD-10-CM | POA: Diagnosis not present

## 2020-06-05 DIAGNOSIS — J449 Chronic obstructive pulmonary disease, unspecified: Secondary | ICD-10-CM | POA: Diagnosis not present

## 2020-06-05 DIAGNOSIS — I10 Essential (primary) hypertension: Secondary | ICD-10-CM | POA: Diagnosis not present

## 2020-06-05 DIAGNOSIS — Z8614 Personal history of Methicillin resistant Staphylococcus aureus infection: Secondary | ICD-10-CM | POA: Diagnosis not present

## 2020-06-05 DIAGNOSIS — Z681 Body mass index (BMI) 19 or less, adult: Secondary | ICD-10-CM | POA: Diagnosis not present

## 2020-06-06 DIAGNOSIS — G47419 Narcolepsy without cataplexy: Secondary | ICD-10-CM | POA: Diagnosis not present

## 2020-06-06 DIAGNOSIS — G35 Multiple sclerosis: Secondary | ICD-10-CM | POA: Diagnosis not present

## 2020-06-06 DIAGNOSIS — J449 Chronic obstructive pulmonary disease, unspecified: Secondary | ICD-10-CM | POA: Diagnosis not present

## 2020-06-06 DIAGNOSIS — D649 Anemia, unspecified: Secondary | ICD-10-CM | POA: Diagnosis not present

## 2020-06-06 DIAGNOSIS — E785 Hyperlipidemia, unspecified: Secondary | ICD-10-CM | POA: Diagnosis not present

## 2020-06-06 DIAGNOSIS — E46 Unspecified protein-calorie malnutrition: Secondary | ICD-10-CM | POA: Diagnosis not present

## 2020-06-06 DIAGNOSIS — Z681 Body mass index (BMI) 19 or less, adult: Secondary | ICD-10-CM | POA: Diagnosis not present

## 2020-06-06 DIAGNOSIS — Z8614 Personal history of Methicillin resistant Staphylococcus aureus infection: Secondary | ICD-10-CM | POA: Diagnosis not present

## 2020-06-06 DIAGNOSIS — R296 Repeated falls: Secondary | ICD-10-CM | POA: Diagnosis not present

## 2020-06-06 DIAGNOSIS — I10 Essential (primary) hypertension: Secondary | ICD-10-CM | POA: Diagnosis not present

## 2020-06-06 DIAGNOSIS — L89153 Pressure ulcer of sacral region, stage 3: Secondary | ICD-10-CM | POA: Diagnosis not present

## 2020-06-07 DIAGNOSIS — G47419 Narcolepsy without cataplexy: Secondary | ICD-10-CM | POA: Diagnosis not present

## 2020-06-07 DIAGNOSIS — L89153 Pressure ulcer of sacral region, stage 3: Secondary | ICD-10-CM | POA: Diagnosis not present

## 2020-06-07 DIAGNOSIS — Z8614 Personal history of Methicillin resistant Staphylococcus aureus infection: Secondary | ICD-10-CM | POA: Diagnosis not present

## 2020-06-07 DIAGNOSIS — J449 Chronic obstructive pulmonary disease, unspecified: Secondary | ICD-10-CM | POA: Diagnosis not present

## 2020-06-07 DIAGNOSIS — R296 Repeated falls: Secondary | ICD-10-CM | POA: Diagnosis not present

## 2020-06-07 DIAGNOSIS — E46 Unspecified protein-calorie malnutrition: Secondary | ICD-10-CM | POA: Diagnosis not present

## 2020-06-07 DIAGNOSIS — E785 Hyperlipidemia, unspecified: Secondary | ICD-10-CM | POA: Diagnosis not present

## 2020-06-07 DIAGNOSIS — D649 Anemia, unspecified: Secondary | ICD-10-CM | POA: Diagnosis not present

## 2020-06-07 DIAGNOSIS — G35 Multiple sclerosis: Secondary | ICD-10-CM | POA: Diagnosis not present

## 2020-06-07 DIAGNOSIS — I10 Essential (primary) hypertension: Secondary | ICD-10-CM | POA: Diagnosis not present

## 2020-06-07 DIAGNOSIS — Z681 Body mass index (BMI) 19 or less, adult: Secondary | ICD-10-CM | POA: Diagnosis not present

## 2020-06-08 DIAGNOSIS — R296 Repeated falls: Secondary | ICD-10-CM | POA: Diagnosis not present

## 2020-06-08 DIAGNOSIS — G47419 Narcolepsy without cataplexy: Secondary | ICD-10-CM | POA: Diagnosis not present

## 2020-06-08 DIAGNOSIS — E46 Unspecified protein-calorie malnutrition: Secondary | ICD-10-CM | POA: Diagnosis not present

## 2020-06-08 DIAGNOSIS — D649 Anemia, unspecified: Secondary | ICD-10-CM | POA: Diagnosis not present

## 2020-06-08 DIAGNOSIS — Z8614 Personal history of Methicillin resistant Staphylococcus aureus infection: Secondary | ICD-10-CM | POA: Diagnosis not present

## 2020-06-08 DIAGNOSIS — Z681 Body mass index (BMI) 19 or less, adult: Secondary | ICD-10-CM | POA: Diagnosis not present

## 2020-06-08 DIAGNOSIS — J449 Chronic obstructive pulmonary disease, unspecified: Secondary | ICD-10-CM | POA: Diagnosis not present

## 2020-06-08 DIAGNOSIS — G35 Multiple sclerosis: Secondary | ICD-10-CM | POA: Diagnosis not present

## 2020-06-08 DIAGNOSIS — L89153 Pressure ulcer of sacral region, stage 3: Secondary | ICD-10-CM | POA: Diagnosis not present

## 2020-06-08 DIAGNOSIS — I10 Essential (primary) hypertension: Secondary | ICD-10-CM | POA: Diagnosis not present

## 2020-06-08 DIAGNOSIS — E785 Hyperlipidemia, unspecified: Secondary | ICD-10-CM | POA: Diagnosis not present

## 2020-06-09 DIAGNOSIS — D649 Anemia, unspecified: Secondary | ICD-10-CM | POA: Diagnosis not present

## 2020-06-09 DIAGNOSIS — G35 Multiple sclerosis: Secondary | ICD-10-CM | POA: Diagnosis not present

## 2020-06-09 DIAGNOSIS — E46 Unspecified protein-calorie malnutrition: Secondary | ICD-10-CM | POA: Diagnosis not present

## 2020-06-09 DIAGNOSIS — I10 Essential (primary) hypertension: Secondary | ICD-10-CM | POA: Diagnosis not present

## 2020-06-09 DIAGNOSIS — G47419 Narcolepsy without cataplexy: Secondary | ICD-10-CM | POA: Diagnosis not present

## 2020-06-09 DIAGNOSIS — E785 Hyperlipidemia, unspecified: Secondary | ICD-10-CM | POA: Diagnosis not present

## 2020-06-09 DIAGNOSIS — Z681 Body mass index (BMI) 19 or less, adult: Secondary | ICD-10-CM | POA: Diagnosis not present

## 2020-06-09 DIAGNOSIS — R296 Repeated falls: Secondary | ICD-10-CM | POA: Diagnosis not present

## 2020-06-09 DIAGNOSIS — L89153 Pressure ulcer of sacral region, stage 3: Secondary | ICD-10-CM | POA: Diagnosis not present

## 2020-06-09 DIAGNOSIS — Z8614 Personal history of Methicillin resistant Staphylococcus aureus infection: Secondary | ICD-10-CM | POA: Diagnosis not present

## 2020-06-09 DIAGNOSIS — J449 Chronic obstructive pulmonary disease, unspecified: Secondary | ICD-10-CM | POA: Diagnosis not present

## 2020-06-10 DIAGNOSIS — Z681 Body mass index (BMI) 19 or less, adult: Secondary | ICD-10-CM | POA: Diagnosis not present

## 2020-06-10 DIAGNOSIS — G47419 Narcolepsy without cataplexy: Secondary | ICD-10-CM | POA: Diagnosis not present

## 2020-06-10 DIAGNOSIS — E785 Hyperlipidemia, unspecified: Secondary | ICD-10-CM | POA: Diagnosis not present

## 2020-06-10 DIAGNOSIS — I10 Essential (primary) hypertension: Secondary | ICD-10-CM | POA: Diagnosis not present

## 2020-06-10 DIAGNOSIS — E46 Unspecified protein-calorie malnutrition: Secondary | ICD-10-CM | POA: Diagnosis not present

## 2020-06-10 DIAGNOSIS — L89153 Pressure ulcer of sacral region, stage 3: Secondary | ICD-10-CM | POA: Diagnosis not present

## 2020-06-10 DIAGNOSIS — D649 Anemia, unspecified: Secondary | ICD-10-CM | POA: Diagnosis not present

## 2020-06-10 DIAGNOSIS — R296 Repeated falls: Secondary | ICD-10-CM | POA: Diagnosis not present

## 2020-06-10 DIAGNOSIS — G35 Multiple sclerosis: Secondary | ICD-10-CM | POA: Diagnosis not present

## 2020-06-10 DIAGNOSIS — J449 Chronic obstructive pulmonary disease, unspecified: Secondary | ICD-10-CM | POA: Diagnosis not present

## 2020-06-10 DIAGNOSIS — Z8614 Personal history of Methicillin resistant Staphylococcus aureus infection: Secondary | ICD-10-CM | POA: Diagnosis not present

## 2020-06-11 DIAGNOSIS — L89153 Pressure ulcer of sacral region, stage 3: Secondary | ICD-10-CM | POA: Diagnosis not present

## 2020-06-11 DIAGNOSIS — G35 Multiple sclerosis: Secondary | ICD-10-CM | POA: Diagnosis not present

## 2020-06-11 DIAGNOSIS — R296 Repeated falls: Secondary | ICD-10-CM | POA: Diagnosis not present

## 2020-06-11 DIAGNOSIS — G47419 Narcolepsy without cataplexy: Secondary | ICD-10-CM | POA: Diagnosis not present

## 2020-06-11 DIAGNOSIS — J449 Chronic obstructive pulmonary disease, unspecified: Secondary | ICD-10-CM | POA: Diagnosis not present

## 2020-06-11 DIAGNOSIS — D649 Anemia, unspecified: Secondary | ICD-10-CM | POA: Diagnosis not present

## 2020-06-11 DIAGNOSIS — I10 Essential (primary) hypertension: Secondary | ICD-10-CM | POA: Diagnosis not present

## 2020-06-11 DIAGNOSIS — E46 Unspecified protein-calorie malnutrition: Secondary | ICD-10-CM | POA: Diagnosis not present

## 2020-06-11 DIAGNOSIS — Z8614 Personal history of Methicillin resistant Staphylococcus aureus infection: Secondary | ICD-10-CM | POA: Diagnosis not present

## 2020-06-11 DIAGNOSIS — Z681 Body mass index (BMI) 19 or less, adult: Secondary | ICD-10-CM | POA: Diagnosis not present

## 2020-06-11 DIAGNOSIS — E785 Hyperlipidemia, unspecified: Secondary | ICD-10-CM | POA: Diagnosis not present

## 2020-06-12 ENCOUNTER — Other Ambulatory Visit (HOSPITAL_COMMUNITY): Payer: Medicaid Other

## 2020-06-12 DIAGNOSIS — I10 Essential (primary) hypertension: Secondary | ICD-10-CM | POA: Diagnosis not present

## 2020-06-12 DIAGNOSIS — G35 Multiple sclerosis: Secondary | ICD-10-CM | POA: Diagnosis not present

## 2020-06-12 DIAGNOSIS — D649 Anemia, unspecified: Secondary | ICD-10-CM | POA: Diagnosis not present

## 2020-06-12 DIAGNOSIS — E46 Unspecified protein-calorie malnutrition: Secondary | ICD-10-CM | POA: Diagnosis not present

## 2020-06-12 DIAGNOSIS — J449 Chronic obstructive pulmonary disease, unspecified: Secondary | ICD-10-CM | POA: Diagnosis not present

## 2020-06-12 DIAGNOSIS — R296 Repeated falls: Secondary | ICD-10-CM | POA: Diagnosis not present

## 2020-06-12 DIAGNOSIS — Z681 Body mass index (BMI) 19 or less, adult: Secondary | ICD-10-CM | POA: Diagnosis not present

## 2020-06-12 DIAGNOSIS — L89153 Pressure ulcer of sacral region, stage 3: Secondary | ICD-10-CM | POA: Diagnosis not present

## 2020-06-12 DIAGNOSIS — E785 Hyperlipidemia, unspecified: Secondary | ICD-10-CM | POA: Diagnosis not present

## 2020-06-12 DIAGNOSIS — G47419 Narcolepsy without cataplexy: Secondary | ICD-10-CM | POA: Diagnosis not present

## 2020-06-12 DIAGNOSIS — Z8614 Personal history of Methicillin resistant Staphylococcus aureus infection: Secondary | ICD-10-CM | POA: Diagnosis not present

## 2020-06-13 DIAGNOSIS — G35 Multiple sclerosis: Secondary | ICD-10-CM | POA: Diagnosis not present

## 2020-06-13 DIAGNOSIS — Z8614 Personal history of Methicillin resistant Staphylococcus aureus infection: Secondary | ICD-10-CM | POA: Diagnosis not present

## 2020-06-13 DIAGNOSIS — R296 Repeated falls: Secondary | ICD-10-CM | POA: Diagnosis not present

## 2020-06-13 DIAGNOSIS — E46 Unspecified protein-calorie malnutrition: Secondary | ICD-10-CM | POA: Diagnosis not present

## 2020-06-13 DIAGNOSIS — L89153 Pressure ulcer of sacral region, stage 3: Secondary | ICD-10-CM | POA: Diagnosis not present

## 2020-06-13 DIAGNOSIS — E785 Hyperlipidemia, unspecified: Secondary | ICD-10-CM | POA: Diagnosis not present

## 2020-06-13 DIAGNOSIS — D649 Anemia, unspecified: Secondary | ICD-10-CM | POA: Diagnosis not present

## 2020-06-13 DIAGNOSIS — G47419 Narcolepsy without cataplexy: Secondary | ICD-10-CM | POA: Diagnosis not present

## 2020-06-13 DIAGNOSIS — I10 Essential (primary) hypertension: Secondary | ICD-10-CM | POA: Diagnosis not present

## 2020-06-13 DIAGNOSIS — J449 Chronic obstructive pulmonary disease, unspecified: Secondary | ICD-10-CM | POA: Diagnosis not present

## 2020-06-13 DIAGNOSIS — Z681 Body mass index (BMI) 19 or less, adult: Secondary | ICD-10-CM | POA: Diagnosis not present

## 2020-06-14 DIAGNOSIS — Z8614 Personal history of Methicillin resistant Staphylococcus aureus infection: Secondary | ICD-10-CM | POA: Diagnosis not present

## 2020-06-14 DIAGNOSIS — R296 Repeated falls: Secondary | ICD-10-CM | POA: Diagnosis not present

## 2020-06-14 DIAGNOSIS — I10 Essential (primary) hypertension: Secondary | ICD-10-CM | POA: Diagnosis not present

## 2020-06-14 DIAGNOSIS — G35 Multiple sclerosis: Secondary | ICD-10-CM | POA: Diagnosis not present

## 2020-06-14 DIAGNOSIS — L89153 Pressure ulcer of sacral region, stage 3: Secondary | ICD-10-CM | POA: Diagnosis not present

## 2020-06-14 DIAGNOSIS — D649 Anemia, unspecified: Secondary | ICD-10-CM | POA: Diagnosis not present

## 2020-06-14 DIAGNOSIS — E46 Unspecified protein-calorie malnutrition: Secondary | ICD-10-CM | POA: Diagnosis not present

## 2020-06-14 DIAGNOSIS — Z681 Body mass index (BMI) 19 or less, adult: Secondary | ICD-10-CM | POA: Diagnosis not present

## 2020-06-14 DIAGNOSIS — G47419 Narcolepsy without cataplexy: Secondary | ICD-10-CM | POA: Diagnosis not present

## 2020-06-14 DIAGNOSIS — J449 Chronic obstructive pulmonary disease, unspecified: Secondary | ICD-10-CM | POA: Diagnosis not present

## 2020-06-14 DIAGNOSIS — E785 Hyperlipidemia, unspecified: Secondary | ICD-10-CM | POA: Diagnosis not present

## 2020-06-15 DIAGNOSIS — I10 Essential (primary) hypertension: Secondary | ICD-10-CM | POA: Diagnosis not present

## 2020-06-15 DIAGNOSIS — G35 Multiple sclerosis: Secondary | ICD-10-CM | POA: Diagnosis not present

## 2020-06-15 DIAGNOSIS — L89153 Pressure ulcer of sacral region, stage 3: Secondary | ICD-10-CM | POA: Diagnosis not present

## 2020-06-15 DIAGNOSIS — E46 Unspecified protein-calorie malnutrition: Secondary | ICD-10-CM | POA: Diagnosis not present

## 2020-06-15 DIAGNOSIS — E785 Hyperlipidemia, unspecified: Secondary | ICD-10-CM | POA: Diagnosis not present

## 2020-06-15 DIAGNOSIS — Z8614 Personal history of Methicillin resistant Staphylococcus aureus infection: Secondary | ICD-10-CM | POA: Diagnosis not present

## 2020-06-15 DIAGNOSIS — G47419 Narcolepsy without cataplexy: Secondary | ICD-10-CM | POA: Diagnosis not present

## 2020-06-15 DIAGNOSIS — J449 Chronic obstructive pulmonary disease, unspecified: Secondary | ICD-10-CM | POA: Diagnosis not present

## 2020-06-15 DIAGNOSIS — R296 Repeated falls: Secondary | ICD-10-CM | POA: Diagnosis not present

## 2020-06-15 DIAGNOSIS — D649 Anemia, unspecified: Secondary | ICD-10-CM | POA: Diagnosis not present

## 2020-06-15 DIAGNOSIS — Z681 Body mass index (BMI) 19 or less, adult: Secondary | ICD-10-CM | POA: Diagnosis not present

## 2020-06-16 DIAGNOSIS — G47419 Narcolepsy without cataplexy: Secondary | ICD-10-CM | POA: Diagnosis not present

## 2020-06-16 DIAGNOSIS — Z8614 Personal history of Methicillin resistant Staphylococcus aureus infection: Secondary | ICD-10-CM | POA: Diagnosis not present

## 2020-06-16 DIAGNOSIS — D649 Anemia, unspecified: Secondary | ICD-10-CM | POA: Diagnosis not present

## 2020-06-16 DIAGNOSIS — E785 Hyperlipidemia, unspecified: Secondary | ICD-10-CM | POA: Diagnosis not present

## 2020-06-16 DIAGNOSIS — J449 Chronic obstructive pulmonary disease, unspecified: Secondary | ICD-10-CM | POA: Diagnosis not present

## 2020-06-16 DIAGNOSIS — E46 Unspecified protein-calorie malnutrition: Secondary | ICD-10-CM | POA: Diagnosis not present

## 2020-06-16 DIAGNOSIS — Z681 Body mass index (BMI) 19 or less, adult: Secondary | ICD-10-CM | POA: Diagnosis not present

## 2020-06-16 DIAGNOSIS — L89153 Pressure ulcer of sacral region, stage 3: Secondary | ICD-10-CM | POA: Diagnosis not present

## 2020-06-16 DIAGNOSIS — I10 Essential (primary) hypertension: Secondary | ICD-10-CM | POA: Diagnosis not present

## 2020-06-16 DIAGNOSIS — G35 Multiple sclerosis: Secondary | ICD-10-CM | POA: Diagnosis not present

## 2020-06-16 DIAGNOSIS — R296 Repeated falls: Secondary | ICD-10-CM | POA: Diagnosis not present

## 2020-06-17 DIAGNOSIS — G47419 Narcolepsy without cataplexy: Secondary | ICD-10-CM | POA: Diagnosis not present

## 2020-06-17 DIAGNOSIS — R296 Repeated falls: Secondary | ICD-10-CM | POA: Diagnosis not present

## 2020-06-17 DIAGNOSIS — L89153 Pressure ulcer of sacral region, stage 3: Secondary | ICD-10-CM | POA: Diagnosis not present

## 2020-06-17 DIAGNOSIS — E785 Hyperlipidemia, unspecified: Secondary | ICD-10-CM | POA: Diagnosis not present

## 2020-06-17 DIAGNOSIS — J449 Chronic obstructive pulmonary disease, unspecified: Secondary | ICD-10-CM | POA: Diagnosis not present

## 2020-06-17 DIAGNOSIS — Z681 Body mass index (BMI) 19 or less, adult: Secondary | ICD-10-CM | POA: Diagnosis not present

## 2020-06-17 DIAGNOSIS — G35 Multiple sclerosis: Secondary | ICD-10-CM | POA: Diagnosis not present

## 2020-06-17 DIAGNOSIS — E46 Unspecified protein-calorie malnutrition: Secondary | ICD-10-CM | POA: Diagnosis not present

## 2020-06-17 DIAGNOSIS — I10 Essential (primary) hypertension: Secondary | ICD-10-CM | POA: Diagnosis not present

## 2020-06-17 DIAGNOSIS — D649 Anemia, unspecified: Secondary | ICD-10-CM | POA: Diagnosis not present

## 2020-06-17 DIAGNOSIS — Z8614 Personal history of Methicillin resistant Staphylococcus aureus infection: Secondary | ICD-10-CM | POA: Diagnosis not present

## 2020-06-18 ENCOUNTER — Telehealth (INDEPENDENT_AMBULATORY_CARE_PROVIDER_SITE_OTHER): Payer: Self-pay

## 2020-06-18 ENCOUNTER — Other Ambulatory Visit (INDEPENDENT_AMBULATORY_CARE_PROVIDER_SITE_OTHER): Payer: Self-pay | Admitting: Internal Medicine

## 2020-06-18 DIAGNOSIS — E46 Unspecified protein-calorie malnutrition: Secondary | ICD-10-CM | POA: Diagnosis not present

## 2020-06-18 DIAGNOSIS — J449 Chronic obstructive pulmonary disease, unspecified: Secondary | ICD-10-CM | POA: Diagnosis not present

## 2020-06-18 DIAGNOSIS — L89153 Pressure ulcer of sacral region, stage 3: Secondary | ICD-10-CM | POA: Diagnosis not present

## 2020-06-18 DIAGNOSIS — R296 Repeated falls: Secondary | ICD-10-CM | POA: Diagnosis not present

## 2020-06-18 DIAGNOSIS — G35 Multiple sclerosis: Secondary | ICD-10-CM | POA: Diagnosis not present

## 2020-06-18 DIAGNOSIS — Z8614 Personal history of Methicillin resistant Staphylococcus aureus infection: Secondary | ICD-10-CM | POA: Diagnosis not present

## 2020-06-18 DIAGNOSIS — D649 Anemia, unspecified: Secondary | ICD-10-CM | POA: Diagnosis not present

## 2020-06-18 DIAGNOSIS — Z681 Body mass index (BMI) 19 or less, adult: Secondary | ICD-10-CM | POA: Diagnosis not present

## 2020-06-18 DIAGNOSIS — E785 Hyperlipidemia, unspecified: Secondary | ICD-10-CM | POA: Diagnosis not present

## 2020-06-18 DIAGNOSIS — G47419 Narcolepsy without cataplexy: Secondary | ICD-10-CM | POA: Diagnosis not present

## 2020-06-18 DIAGNOSIS — I10 Essential (primary) hypertension: Secondary | ICD-10-CM | POA: Diagnosis not present

## 2020-06-18 MED ORDER — HYDROCODONE-ACETAMINOPHEN 5-325 MG PO TABS: 1.0000 | ORAL_TABLET | Freq: Four times a day (QID) | ORAL | 0 refills | Status: DC | PRN

## 2020-06-18 NOTE — Telephone Encounter (Signed)
Rickey Barbara an RN with [facility] in [place] called and saw patient today and stated this patient needs a refill of the following medication:  HYDROcodone-acetaminophen (NORCO/VICODIN) 5-325 MG tablet  Last filled 05/20/2020, # 30 with 0 refills  Last OV 04/03/2020  Next OV 10/02/2020

## 2020-06-19 ENCOUNTER — Ambulatory Visit (HOSPITAL_COMMUNITY): Payer: Medicaid Other | Admitting: Hematology

## 2020-06-19 DIAGNOSIS — Z681 Body mass index (BMI) 19 or less, adult: Secondary | ICD-10-CM | POA: Diagnosis not present

## 2020-06-19 DIAGNOSIS — Z8614 Personal history of Methicillin resistant Staphylococcus aureus infection: Secondary | ICD-10-CM | POA: Diagnosis not present

## 2020-06-19 DIAGNOSIS — E785 Hyperlipidemia, unspecified: Secondary | ICD-10-CM | POA: Diagnosis not present

## 2020-06-19 DIAGNOSIS — J449 Chronic obstructive pulmonary disease, unspecified: Secondary | ICD-10-CM | POA: Diagnosis not present

## 2020-06-19 DIAGNOSIS — G47419 Narcolepsy without cataplexy: Secondary | ICD-10-CM | POA: Diagnosis not present

## 2020-06-19 DIAGNOSIS — E46 Unspecified protein-calorie malnutrition: Secondary | ICD-10-CM | POA: Diagnosis not present

## 2020-06-19 DIAGNOSIS — L89153 Pressure ulcer of sacral region, stage 3: Secondary | ICD-10-CM | POA: Diagnosis not present

## 2020-06-19 DIAGNOSIS — G35 Multiple sclerosis: Secondary | ICD-10-CM | POA: Diagnosis not present

## 2020-06-19 DIAGNOSIS — I10 Essential (primary) hypertension: Secondary | ICD-10-CM | POA: Diagnosis not present

## 2020-06-19 DIAGNOSIS — R296 Repeated falls: Secondary | ICD-10-CM | POA: Diagnosis not present

## 2020-06-19 DIAGNOSIS — D649 Anemia, unspecified: Secondary | ICD-10-CM | POA: Diagnosis not present

## 2020-06-20 DIAGNOSIS — Z681 Body mass index (BMI) 19 or less, adult: Secondary | ICD-10-CM | POA: Diagnosis not present

## 2020-06-20 DIAGNOSIS — J449 Chronic obstructive pulmonary disease, unspecified: Secondary | ICD-10-CM | POA: Diagnosis not present

## 2020-06-20 DIAGNOSIS — Z8614 Personal history of Methicillin resistant Staphylococcus aureus infection: Secondary | ICD-10-CM | POA: Diagnosis not present

## 2020-06-20 DIAGNOSIS — I10 Essential (primary) hypertension: Secondary | ICD-10-CM | POA: Diagnosis not present

## 2020-06-20 DIAGNOSIS — E46 Unspecified protein-calorie malnutrition: Secondary | ICD-10-CM | POA: Diagnosis not present

## 2020-06-20 DIAGNOSIS — L89153 Pressure ulcer of sacral region, stage 3: Secondary | ICD-10-CM | POA: Diagnosis not present

## 2020-06-20 DIAGNOSIS — G47419 Narcolepsy without cataplexy: Secondary | ICD-10-CM | POA: Diagnosis not present

## 2020-06-20 DIAGNOSIS — E785 Hyperlipidemia, unspecified: Secondary | ICD-10-CM | POA: Diagnosis not present

## 2020-06-20 DIAGNOSIS — R296 Repeated falls: Secondary | ICD-10-CM | POA: Diagnosis not present

## 2020-06-20 DIAGNOSIS — G35 Multiple sclerosis: Secondary | ICD-10-CM | POA: Diagnosis not present

## 2020-06-20 DIAGNOSIS — D649 Anemia, unspecified: Secondary | ICD-10-CM | POA: Diagnosis not present

## 2020-06-21 DIAGNOSIS — L89153 Pressure ulcer of sacral region, stage 3: Secondary | ICD-10-CM | POA: Diagnosis not present

## 2020-06-21 DIAGNOSIS — Z8614 Personal history of Methicillin resistant Staphylococcus aureus infection: Secondary | ICD-10-CM | POA: Diagnosis not present

## 2020-06-21 DIAGNOSIS — J449 Chronic obstructive pulmonary disease, unspecified: Secondary | ICD-10-CM | POA: Diagnosis not present

## 2020-06-21 DIAGNOSIS — D649 Anemia, unspecified: Secondary | ICD-10-CM | POA: Diagnosis not present

## 2020-06-21 DIAGNOSIS — G35 Multiple sclerosis: Secondary | ICD-10-CM | POA: Diagnosis not present

## 2020-06-21 DIAGNOSIS — G47419 Narcolepsy without cataplexy: Secondary | ICD-10-CM | POA: Diagnosis not present

## 2020-06-21 DIAGNOSIS — I10 Essential (primary) hypertension: Secondary | ICD-10-CM | POA: Diagnosis not present

## 2020-06-21 DIAGNOSIS — Z681 Body mass index (BMI) 19 or less, adult: Secondary | ICD-10-CM | POA: Diagnosis not present

## 2020-06-21 DIAGNOSIS — R296 Repeated falls: Secondary | ICD-10-CM | POA: Diagnosis not present

## 2020-06-21 DIAGNOSIS — E785 Hyperlipidemia, unspecified: Secondary | ICD-10-CM | POA: Diagnosis not present

## 2020-06-21 DIAGNOSIS — E46 Unspecified protein-calorie malnutrition: Secondary | ICD-10-CM | POA: Diagnosis not present

## 2020-06-22 DIAGNOSIS — J449 Chronic obstructive pulmonary disease, unspecified: Secondary | ICD-10-CM | POA: Diagnosis not present

## 2020-06-22 DIAGNOSIS — E46 Unspecified protein-calorie malnutrition: Secondary | ICD-10-CM | POA: Diagnosis not present

## 2020-06-22 DIAGNOSIS — D649 Anemia, unspecified: Secondary | ICD-10-CM | POA: Diagnosis not present

## 2020-06-22 DIAGNOSIS — G35 Multiple sclerosis: Secondary | ICD-10-CM | POA: Diagnosis not present

## 2020-06-22 DIAGNOSIS — I10 Essential (primary) hypertension: Secondary | ICD-10-CM | POA: Diagnosis not present

## 2020-06-22 DIAGNOSIS — R296 Repeated falls: Secondary | ICD-10-CM | POA: Diagnosis not present

## 2020-06-22 DIAGNOSIS — E785 Hyperlipidemia, unspecified: Secondary | ICD-10-CM | POA: Diagnosis not present

## 2020-06-22 DIAGNOSIS — G47419 Narcolepsy without cataplexy: Secondary | ICD-10-CM | POA: Diagnosis not present

## 2020-06-22 DIAGNOSIS — Z681 Body mass index (BMI) 19 or less, adult: Secondary | ICD-10-CM | POA: Diagnosis not present

## 2020-06-22 DIAGNOSIS — Z8614 Personal history of Methicillin resistant Staphylococcus aureus infection: Secondary | ICD-10-CM | POA: Diagnosis not present

## 2020-06-22 DIAGNOSIS — L89153 Pressure ulcer of sacral region, stage 3: Secondary | ICD-10-CM | POA: Diagnosis not present

## 2020-06-23 DIAGNOSIS — E785 Hyperlipidemia, unspecified: Secondary | ICD-10-CM | POA: Diagnosis not present

## 2020-06-23 DIAGNOSIS — R296 Repeated falls: Secondary | ICD-10-CM | POA: Diagnosis not present

## 2020-06-23 DIAGNOSIS — D649 Anemia, unspecified: Secondary | ICD-10-CM | POA: Diagnosis not present

## 2020-06-23 DIAGNOSIS — Z681 Body mass index (BMI) 19 or less, adult: Secondary | ICD-10-CM | POA: Diagnosis not present

## 2020-06-23 DIAGNOSIS — Z8614 Personal history of Methicillin resistant Staphylococcus aureus infection: Secondary | ICD-10-CM | POA: Diagnosis not present

## 2020-06-23 DIAGNOSIS — I10 Essential (primary) hypertension: Secondary | ICD-10-CM | POA: Diagnosis not present

## 2020-06-23 DIAGNOSIS — L89153 Pressure ulcer of sacral region, stage 3: Secondary | ICD-10-CM | POA: Diagnosis not present

## 2020-06-23 DIAGNOSIS — G47419 Narcolepsy without cataplexy: Secondary | ICD-10-CM | POA: Diagnosis not present

## 2020-06-23 DIAGNOSIS — J449 Chronic obstructive pulmonary disease, unspecified: Secondary | ICD-10-CM | POA: Diagnosis not present

## 2020-06-23 DIAGNOSIS — G35 Multiple sclerosis: Secondary | ICD-10-CM | POA: Diagnosis not present

## 2020-06-23 DIAGNOSIS — E46 Unspecified protein-calorie malnutrition: Secondary | ICD-10-CM | POA: Diagnosis not present

## 2020-06-24 DIAGNOSIS — E785 Hyperlipidemia, unspecified: Secondary | ICD-10-CM | POA: Diagnosis not present

## 2020-06-24 DIAGNOSIS — G35 Multiple sclerosis: Secondary | ICD-10-CM | POA: Diagnosis not present

## 2020-06-24 DIAGNOSIS — E46 Unspecified protein-calorie malnutrition: Secondary | ICD-10-CM | POA: Diagnosis not present

## 2020-06-24 DIAGNOSIS — J449 Chronic obstructive pulmonary disease, unspecified: Secondary | ICD-10-CM | POA: Diagnosis not present

## 2020-06-24 DIAGNOSIS — I10 Essential (primary) hypertension: Secondary | ICD-10-CM | POA: Diagnosis not present

## 2020-06-24 DIAGNOSIS — Z681 Body mass index (BMI) 19 or less, adult: Secondary | ICD-10-CM | POA: Diagnosis not present

## 2020-06-24 DIAGNOSIS — L89153 Pressure ulcer of sacral region, stage 3: Secondary | ICD-10-CM | POA: Diagnosis not present

## 2020-06-24 DIAGNOSIS — D649 Anemia, unspecified: Secondary | ICD-10-CM | POA: Diagnosis not present

## 2020-06-24 DIAGNOSIS — R296 Repeated falls: Secondary | ICD-10-CM | POA: Diagnosis not present

## 2020-06-24 DIAGNOSIS — Z8614 Personal history of Methicillin resistant Staphylococcus aureus infection: Secondary | ICD-10-CM | POA: Diagnosis not present

## 2020-06-24 DIAGNOSIS — G47419 Narcolepsy without cataplexy: Secondary | ICD-10-CM | POA: Diagnosis not present

## 2020-06-25 DIAGNOSIS — G35 Multiple sclerosis: Secondary | ICD-10-CM | POA: Diagnosis not present

## 2020-06-25 DIAGNOSIS — Z681 Body mass index (BMI) 19 or less, adult: Secondary | ICD-10-CM | POA: Diagnosis not present

## 2020-06-25 DIAGNOSIS — J449 Chronic obstructive pulmonary disease, unspecified: Secondary | ICD-10-CM | POA: Diagnosis not present

## 2020-06-25 DIAGNOSIS — Z8614 Personal history of Methicillin resistant Staphylococcus aureus infection: Secondary | ICD-10-CM | POA: Diagnosis not present

## 2020-06-25 DIAGNOSIS — E46 Unspecified protein-calorie malnutrition: Secondary | ICD-10-CM | POA: Diagnosis not present

## 2020-06-25 DIAGNOSIS — I10 Essential (primary) hypertension: Secondary | ICD-10-CM | POA: Diagnosis not present

## 2020-06-25 DIAGNOSIS — E785 Hyperlipidemia, unspecified: Secondary | ICD-10-CM | POA: Diagnosis not present

## 2020-06-25 DIAGNOSIS — L89153 Pressure ulcer of sacral region, stage 3: Secondary | ICD-10-CM | POA: Diagnosis not present

## 2020-06-25 DIAGNOSIS — R296 Repeated falls: Secondary | ICD-10-CM | POA: Diagnosis not present

## 2020-06-25 DIAGNOSIS — G47419 Narcolepsy without cataplexy: Secondary | ICD-10-CM | POA: Diagnosis not present

## 2020-06-25 DIAGNOSIS — D649 Anemia, unspecified: Secondary | ICD-10-CM | POA: Diagnosis not present

## 2020-06-26 DIAGNOSIS — D649 Anemia, unspecified: Secondary | ICD-10-CM | POA: Diagnosis not present

## 2020-06-26 DIAGNOSIS — L89153 Pressure ulcer of sacral region, stage 3: Secondary | ICD-10-CM | POA: Diagnosis not present

## 2020-06-26 DIAGNOSIS — G47419 Narcolepsy without cataplexy: Secondary | ICD-10-CM | POA: Diagnosis not present

## 2020-06-26 DIAGNOSIS — I10 Essential (primary) hypertension: Secondary | ICD-10-CM | POA: Diagnosis not present

## 2020-06-26 DIAGNOSIS — E785 Hyperlipidemia, unspecified: Secondary | ICD-10-CM | POA: Diagnosis not present

## 2020-06-26 DIAGNOSIS — J449 Chronic obstructive pulmonary disease, unspecified: Secondary | ICD-10-CM | POA: Diagnosis not present

## 2020-06-26 DIAGNOSIS — G35 Multiple sclerosis: Secondary | ICD-10-CM | POA: Diagnosis not present

## 2020-06-26 DIAGNOSIS — E46 Unspecified protein-calorie malnutrition: Secondary | ICD-10-CM | POA: Diagnosis not present

## 2020-06-26 DIAGNOSIS — Z681 Body mass index (BMI) 19 or less, adult: Secondary | ICD-10-CM | POA: Diagnosis not present

## 2020-06-26 DIAGNOSIS — Z8614 Personal history of Methicillin resistant Staphylococcus aureus infection: Secondary | ICD-10-CM | POA: Diagnosis not present

## 2020-06-26 DIAGNOSIS — R296 Repeated falls: Secondary | ICD-10-CM | POA: Diagnosis not present

## 2020-06-27 DIAGNOSIS — D649 Anemia, unspecified: Secondary | ICD-10-CM | POA: Diagnosis not present

## 2020-06-27 DIAGNOSIS — L89153 Pressure ulcer of sacral region, stage 3: Secondary | ICD-10-CM | POA: Diagnosis not present

## 2020-06-27 DIAGNOSIS — E785 Hyperlipidemia, unspecified: Secondary | ICD-10-CM | POA: Diagnosis not present

## 2020-06-27 DIAGNOSIS — G47419 Narcolepsy without cataplexy: Secondary | ICD-10-CM | POA: Diagnosis not present

## 2020-06-27 DIAGNOSIS — J449 Chronic obstructive pulmonary disease, unspecified: Secondary | ICD-10-CM | POA: Diagnosis not present

## 2020-06-27 DIAGNOSIS — G35 Multiple sclerosis: Secondary | ICD-10-CM | POA: Diagnosis not present

## 2020-06-27 DIAGNOSIS — Z8614 Personal history of Methicillin resistant Staphylococcus aureus infection: Secondary | ICD-10-CM | POA: Diagnosis not present

## 2020-06-27 DIAGNOSIS — R296 Repeated falls: Secondary | ICD-10-CM | POA: Diagnosis not present

## 2020-06-27 DIAGNOSIS — I10 Essential (primary) hypertension: Secondary | ICD-10-CM | POA: Diagnosis not present

## 2020-06-27 DIAGNOSIS — Z681 Body mass index (BMI) 19 or less, adult: Secondary | ICD-10-CM | POA: Diagnosis not present

## 2020-06-27 DIAGNOSIS — E46 Unspecified protein-calorie malnutrition: Secondary | ICD-10-CM | POA: Diagnosis not present

## 2020-06-28 DIAGNOSIS — G47419 Narcolepsy without cataplexy: Secondary | ICD-10-CM | POA: Diagnosis not present

## 2020-06-28 DIAGNOSIS — G35 Multiple sclerosis: Secondary | ICD-10-CM | POA: Diagnosis not present

## 2020-06-28 DIAGNOSIS — Z681 Body mass index (BMI) 19 or less, adult: Secondary | ICD-10-CM | POA: Diagnosis not present

## 2020-06-28 DIAGNOSIS — J449 Chronic obstructive pulmonary disease, unspecified: Secondary | ICD-10-CM | POA: Diagnosis not present

## 2020-06-28 DIAGNOSIS — D649 Anemia, unspecified: Secondary | ICD-10-CM | POA: Diagnosis not present

## 2020-06-28 DIAGNOSIS — E46 Unspecified protein-calorie malnutrition: Secondary | ICD-10-CM | POA: Diagnosis not present

## 2020-06-28 DIAGNOSIS — R296 Repeated falls: Secondary | ICD-10-CM | POA: Diagnosis not present

## 2020-06-28 DIAGNOSIS — Z8614 Personal history of Methicillin resistant Staphylococcus aureus infection: Secondary | ICD-10-CM | POA: Diagnosis not present

## 2020-06-28 DIAGNOSIS — I10 Essential (primary) hypertension: Secondary | ICD-10-CM | POA: Diagnosis not present

## 2020-06-28 DIAGNOSIS — L89153 Pressure ulcer of sacral region, stage 3: Secondary | ICD-10-CM | POA: Diagnosis not present

## 2020-06-28 DIAGNOSIS — E785 Hyperlipidemia, unspecified: Secondary | ICD-10-CM | POA: Diagnosis not present

## 2020-06-29 DIAGNOSIS — E46 Unspecified protein-calorie malnutrition: Secondary | ICD-10-CM | POA: Diagnosis not present

## 2020-06-29 DIAGNOSIS — Z8614 Personal history of Methicillin resistant Staphylococcus aureus infection: Secondary | ICD-10-CM | POA: Diagnosis not present

## 2020-06-29 DIAGNOSIS — D649 Anemia, unspecified: Secondary | ICD-10-CM | POA: Diagnosis not present

## 2020-06-29 DIAGNOSIS — J449 Chronic obstructive pulmonary disease, unspecified: Secondary | ICD-10-CM | POA: Diagnosis not present

## 2020-06-29 DIAGNOSIS — I10 Essential (primary) hypertension: Secondary | ICD-10-CM | POA: Diagnosis not present

## 2020-06-29 DIAGNOSIS — G35 Multiple sclerosis: Secondary | ICD-10-CM | POA: Diagnosis not present

## 2020-06-29 DIAGNOSIS — L89153 Pressure ulcer of sacral region, stage 3: Secondary | ICD-10-CM | POA: Diagnosis not present

## 2020-06-29 DIAGNOSIS — E785 Hyperlipidemia, unspecified: Secondary | ICD-10-CM | POA: Diagnosis not present

## 2020-06-29 DIAGNOSIS — G47419 Narcolepsy without cataplexy: Secondary | ICD-10-CM | POA: Diagnosis not present

## 2020-06-29 DIAGNOSIS — Z681 Body mass index (BMI) 19 or less, adult: Secondary | ICD-10-CM | POA: Diagnosis not present

## 2020-06-29 DIAGNOSIS — R296 Repeated falls: Secondary | ICD-10-CM | POA: Diagnosis not present

## 2020-06-30 DIAGNOSIS — E46 Unspecified protein-calorie malnutrition: Secondary | ICD-10-CM | POA: Diagnosis not present

## 2020-06-30 DIAGNOSIS — R296 Repeated falls: Secondary | ICD-10-CM | POA: Diagnosis not present

## 2020-06-30 DIAGNOSIS — D649 Anemia, unspecified: Secondary | ICD-10-CM | POA: Diagnosis not present

## 2020-06-30 DIAGNOSIS — G35 Multiple sclerosis: Secondary | ICD-10-CM | POA: Diagnosis not present

## 2020-06-30 DIAGNOSIS — L89153 Pressure ulcer of sacral region, stage 3: Secondary | ICD-10-CM | POA: Diagnosis not present

## 2020-06-30 DIAGNOSIS — E785 Hyperlipidemia, unspecified: Secondary | ICD-10-CM | POA: Diagnosis not present

## 2020-06-30 DIAGNOSIS — G47419 Narcolepsy without cataplexy: Secondary | ICD-10-CM | POA: Diagnosis not present

## 2020-06-30 DIAGNOSIS — Z681 Body mass index (BMI) 19 or less, adult: Secondary | ICD-10-CM | POA: Diagnosis not present

## 2020-06-30 DIAGNOSIS — Z8614 Personal history of Methicillin resistant Staphylococcus aureus infection: Secondary | ICD-10-CM | POA: Diagnosis not present

## 2020-06-30 DIAGNOSIS — I10 Essential (primary) hypertension: Secondary | ICD-10-CM | POA: Diagnosis not present

## 2020-06-30 DIAGNOSIS — J449 Chronic obstructive pulmonary disease, unspecified: Secondary | ICD-10-CM | POA: Diagnosis not present

## 2020-07-01 DIAGNOSIS — D649 Anemia, unspecified: Secondary | ICD-10-CM | POA: Diagnosis not present

## 2020-07-01 DIAGNOSIS — I10 Essential (primary) hypertension: Secondary | ICD-10-CM | POA: Diagnosis not present

## 2020-07-01 DIAGNOSIS — E46 Unspecified protein-calorie malnutrition: Secondary | ICD-10-CM | POA: Diagnosis not present

## 2020-07-01 DIAGNOSIS — R296 Repeated falls: Secondary | ICD-10-CM | POA: Diagnosis not present

## 2020-07-01 DIAGNOSIS — J449 Chronic obstructive pulmonary disease, unspecified: Secondary | ICD-10-CM | POA: Diagnosis not present

## 2020-07-01 DIAGNOSIS — Z8614 Personal history of Methicillin resistant Staphylococcus aureus infection: Secondary | ICD-10-CM | POA: Diagnosis not present

## 2020-07-01 DIAGNOSIS — Z681 Body mass index (BMI) 19 or less, adult: Secondary | ICD-10-CM | POA: Diagnosis not present

## 2020-07-01 DIAGNOSIS — E785 Hyperlipidemia, unspecified: Secondary | ICD-10-CM | POA: Diagnosis not present

## 2020-07-01 DIAGNOSIS — G47419 Narcolepsy without cataplexy: Secondary | ICD-10-CM | POA: Diagnosis not present

## 2020-07-01 DIAGNOSIS — L89153 Pressure ulcer of sacral region, stage 3: Secondary | ICD-10-CM | POA: Diagnosis not present

## 2020-07-01 DIAGNOSIS — G35 Multiple sclerosis: Secondary | ICD-10-CM | POA: Diagnosis not present

## 2020-07-02 DIAGNOSIS — G47419 Narcolepsy without cataplexy: Secondary | ICD-10-CM | POA: Diagnosis not present

## 2020-07-02 DIAGNOSIS — G35 Multiple sclerosis: Secondary | ICD-10-CM | POA: Diagnosis not present

## 2020-07-02 DIAGNOSIS — R296 Repeated falls: Secondary | ICD-10-CM | POA: Diagnosis not present

## 2020-07-02 DIAGNOSIS — J449 Chronic obstructive pulmonary disease, unspecified: Secondary | ICD-10-CM | POA: Diagnosis not present

## 2020-07-02 DIAGNOSIS — E46 Unspecified protein-calorie malnutrition: Secondary | ICD-10-CM | POA: Diagnosis not present

## 2020-07-02 DIAGNOSIS — D649 Anemia, unspecified: Secondary | ICD-10-CM | POA: Diagnosis not present

## 2020-07-02 DIAGNOSIS — L89153 Pressure ulcer of sacral region, stage 3: Secondary | ICD-10-CM | POA: Diagnosis not present

## 2020-07-02 DIAGNOSIS — I10 Essential (primary) hypertension: Secondary | ICD-10-CM | POA: Diagnosis not present

## 2020-07-02 DIAGNOSIS — Z8614 Personal history of Methicillin resistant Staphylococcus aureus infection: Secondary | ICD-10-CM | POA: Diagnosis not present

## 2020-07-02 DIAGNOSIS — Z681 Body mass index (BMI) 19 or less, adult: Secondary | ICD-10-CM | POA: Diagnosis not present

## 2020-07-02 DIAGNOSIS — E785 Hyperlipidemia, unspecified: Secondary | ICD-10-CM | POA: Diagnosis not present

## 2020-07-03 DIAGNOSIS — Z681 Body mass index (BMI) 19 or less, adult: Secondary | ICD-10-CM | POA: Diagnosis not present

## 2020-07-03 DIAGNOSIS — I10 Essential (primary) hypertension: Secondary | ICD-10-CM | POA: Diagnosis not present

## 2020-07-03 DIAGNOSIS — L89153 Pressure ulcer of sacral region, stage 3: Secondary | ICD-10-CM | POA: Diagnosis not present

## 2020-07-03 DIAGNOSIS — G35 Multiple sclerosis: Secondary | ICD-10-CM | POA: Diagnosis not present

## 2020-07-03 DIAGNOSIS — D649 Anemia, unspecified: Secondary | ICD-10-CM | POA: Diagnosis not present

## 2020-07-03 DIAGNOSIS — Z8614 Personal history of Methicillin resistant Staphylococcus aureus infection: Secondary | ICD-10-CM | POA: Diagnosis not present

## 2020-07-03 DIAGNOSIS — G47419 Narcolepsy without cataplexy: Secondary | ICD-10-CM | POA: Diagnosis not present

## 2020-07-03 DIAGNOSIS — R296 Repeated falls: Secondary | ICD-10-CM | POA: Diagnosis not present

## 2020-07-03 DIAGNOSIS — E46 Unspecified protein-calorie malnutrition: Secondary | ICD-10-CM | POA: Diagnosis not present

## 2020-07-03 DIAGNOSIS — E785 Hyperlipidemia, unspecified: Secondary | ICD-10-CM | POA: Diagnosis not present

## 2020-07-03 DIAGNOSIS — J449 Chronic obstructive pulmonary disease, unspecified: Secondary | ICD-10-CM | POA: Diagnosis not present

## 2020-07-04 DIAGNOSIS — I10 Essential (primary) hypertension: Secondary | ICD-10-CM | POA: Diagnosis not present

## 2020-07-04 DIAGNOSIS — Z8614 Personal history of Methicillin resistant Staphylococcus aureus infection: Secondary | ICD-10-CM | POA: Diagnosis not present

## 2020-07-04 DIAGNOSIS — D649 Anemia, unspecified: Secondary | ICD-10-CM | POA: Diagnosis not present

## 2020-07-04 DIAGNOSIS — J449 Chronic obstructive pulmonary disease, unspecified: Secondary | ICD-10-CM | POA: Diagnosis not present

## 2020-07-04 DIAGNOSIS — G35 Multiple sclerosis: Secondary | ICD-10-CM | POA: Diagnosis not present

## 2020-07-04 DIAGNOSIS — E785 Hyperlipidemia, unspecified: Secondary | ICD-10-CM | POA: Diagnosis not present

## 2020-07-04 DIAGNOSIS — G47419 Narcolepsy without cataplexy: Secondary | ICD-10-CM | POA: Diagnosis not present

## 2020-07-04 DIAGNOSIS — R296 Repeated falls: Secondary | ICD-10-CM | POA: Diagnosis not present

## 2020-07-04 DIAGNOSIS — L89153 Pressure ulcer of sacral region, stage 3: Secondary | ICD-10-CM | POA: Diagnosis not present

## 2020-07-04 DIAGNOSIS — Z681 Body mass index (BMI) 19 or less, adult: Secondary | ICD-10-CM | POA: Diagnosis not present

## 2020-07-04 DIAGNOSIS — E46 Unspecified protein-calorie malnutrition: Secondary | ICD-10-CM | POA: Diagnosis not present

## 2020-07-05 DIAGNOSIS — Z681 Body mass index (BMI) 19 or less, adult: Secondary | ICD-10-CM | POA: Diagnosis not present

## 2020-07-05 DIAGNOSIS — I10 Essential (primary) hypertension: Secondary | ICD-10-CM | POA: Diagnosis not present

## 2020-07-05 DIAGNOSIS — E46 Unspecified protein-calorie malnutrition: Secondary | ICD-10-CM | POA: Diagnosis not present

## 2020-07-05 DIAGNOSIS — Z8614 Personal history of Methicillin resistant Staphylococcus aureus infection: Secondary | ICD-10-CM | POA: Diagnosis not present

## 2020-07-05 DIAGNOSIS — J449 Chronic obstructive pulmonary disease, unspecified: Secondary | ICD-10-CM | POA: Diagnosis not present

## 2020-07-05 DIAGNOSIS — E785 Hyperlipidemia, unspecified: Secondary | ICD-10-CM | POA: Diagnosis not present

## 2020-07-05 DIAGNOSIS — G47419 Narcolepsy without cataplexy: Secondary | ICD-10-CM | POA: Diagnosis not present

## 2020-07-05 DIAGNOSIS — R296 Repeated falls: Secondary | ICD-10-CM | POA: Diagnosis not present

## 2020-07-05 DIAGNOSIS — L89153 Pressure ulcer of sacral region, stage 3: Secondary | ICD-10-CM | POA: Diagnosis not present

## 2020-07-05 DIAGNOSIS — D649 Anemia, unspecified: Secondary | ICD-10-CM | POA: Diagnosis not present

## 2020-07-05 DIAGNOSIS — G35 Multiple sclerosis: Secondary | ICD-10-CM | POA: Diagnosis not present

## 2020-07-06 DIAGNOSIS — G47419 Narcolepsy without cataplexy: Secondary | ICD-10-CM | POA: Diagnosis not present

## 2020-07-06 DIAGNOSIS — G35 Multiple sclerosis: Secondary | ICD-10-CM | POA: Diagnosis not present

## 2020-07-06 DIAGNOSIS — Z681 Body mass index (BMI) 19 or less, adult: Secondary | ICD-10-CM | POA: Diagnosis not present

## 2020-07-06 DIAGNOSIS — R296 Repeated falls: Secondary | ICD-10-CM | POA: Diagnosis not present

## 2020-07-06 DIAGNOSIS — E46 Unspecified protein-calorie malnutrition: Secondary | ICD-10-CM | POA: Diagnosis not present

## 2020-07-06 DIAGNOSIS — I10 Essential (primary) hypertension: Secondary | ICD-10-CM | POA: Diagnosis not present

## 2020-07-06 DIAGNOSIS — J449 Chronic obstructive pulmonary disease, unspecified: Secondary | ICD-10-CM | POA: Diagnosis not present

## 2020-07-06 DIAGNOSIS — E785 Hyperlipidemia, unspecified: Secondary | ICD-10-CM | POA: Diagnosis not present

## 2020-07-06 DIAGNOSIS — L89153 Pressure ulcer of sacral region, stage 3: Secondary | ICD-10-CM | POA: Diagnosis not present

## 2020-07-06 DIAGNOSIS — Z8614 Personal history of Methicillin resistant Staphylococcus aureus infection: Secondary | ICD-10-CM | POA: Diagnosis not present

## 2020-07-06 DIAGNOSIS — D649 Anemia, unspecified: Secondary | ICD-10-CM | POA: Diagnosis not present

## 2020-07-07 DIAGNOSIS — E46 Unspecified protein-calorie malnutrition: Secondary | ICD-10-CM | POA: Diagnosis not present

## 2020-07-07 DIAGNOSIS — J449 Chronic obstructive pulmonary disease, unspecified: Secondary | ICD-10-CM | POA: Diagnosis not present

## 2020-07-07 DIAGNOSIS — G35 Multiple sclerosis: Secondary | ICD-10-CM | POA: Diagnosis not present

## 2020-07-07 DIAGNOSIS — I10 Essential (primary) hypertension: Secondary | ICD-10-CM | POA: Diagnosis not present

## 2020-07-07 DIAGNOSIS — Z8614 Personal history of Methicillin resistant Staphylococcus aureus infection: Secondary | ICD-10-CM | POA: Diagnosis not present

## 2020-07-07 DIAGNOSIS — L89153 Pressure ulcer of sacral region, stage 3: Secondary | ICD-10-CM | POA: Diagnosis not present

## 2020-07-07 DIAGNOSIS — D649 Anemia, unspecified: Secondary | ICD-10-CM | POA: Diagnosis not present

## 2020-07-07 DIAGNOSIS — E785 Hyperlipidemia, unspecified: Secondary | ICD-10-CM | POA: Diagnosis not present

## 2020-07-07 DIAGNOSIS — R296 Repeated falls: Secondary | ICD-10-CM | POA: Diagnosis not present

## 2020-07-07 DIAGNOSIS — Z681 Body mass index (BMI) 19 or less, adult: Secondary | ICD-10-CM | POA: Diagnosis not present

## 2020-07-07 DIAGNOSIS — G47419 Narcolepsy without cataplexy: Secondary | ICD-10-CM | POA: Diagnosis not present

## 2020-07-08 DIAGNOSIS — G47419 Narcolepsy without cataplexy: Secondary | ICD-10-CM | POA: Diagnosis not present

## 2020-07-08 DIAGNOSIS — Z8614 Personal history of Methicillin resistant Staphylococcus aureus infection: Secondary | ICD-10-CM | POA: Diagnosis not present

## 2020-07-08 DIAGNOSIS — G35 Multiple sclerosis: Secondary | ICD-10-CM | POA: Diagnosis not present

## 2020-07-08 DIAGNOSIS — I10 Essential (primary) hypertension: Secondary | ICD-10-CM | POA: Diagnosis not present

## 2020-07-08 DIAGNOSIS — J449 Chronic obstructive pulmonary disease, unspecified: Secondary | ICD-10-CM | POA: Diagnosis not present

## 2020-07-08 DIAGNOSIS — E785 Hyperlipidemia, unspecified: Secondary | ICD-10-CM | POA: Diagnosis not present

## 2020-07-08 DIAGNOSIS — R296 Repeated falls: Secondary | ICD-10-CM | POA: Diagnosis not present

## 2020-07-08 DIAGNOSIS — Z681 Body mass index (BMI) 19 or less, adult: Secondary | ICD-10-CM | POA: Diagnosis not present

## 2020-07-08 DIAGNOSIS — E46 Unspecified protein-calorie malnutrition: Secondary | ICD-10-CM | POA: Diagnosis not present

## 2020-07-08 DIAGNOSIS — D649 Anemia, unspecified: Secondary | ICD-10-CM | POA: Diagnosis not present

## 2020-07-08 DIAGNOSIS — L89153 Pressure ulcer of sacral region, stage 3: Secondary | ICD-10-CM | POA: Diagnosis not present

## 2020-07-09 ENCOUNTER — Other Ambulatory Visit (INDEPENDENT_AMBULATORY_CARE_PROVIDER_SITE_OTHER): Payer: Self-pay | Admitting: Internal Medicine

## 2020-07-09 DIAGNOSIS — G35 Multiple sclerosis: Secondary | ICD-10-CM | POA: Diagnosis not present

## 2020-07-09 DIAGNOSIS — D649 Anemia, unspecified: Secondary | ICD-10-CM | POA: Diagnosis not present

## 2020-07-09 DIAGNOSIS — I10 Essential (primary) hypertension: Secondary | ICD-10-CM | POA: Diagnosis not present

## 2020-07-09 DIAGNOSIS — E785 Hyperlipidemia, unspecified: Secondary | ICD-10-CM | POA: Diagnosis not present

## 2020-07-09 DIAGNOSIS — Z681 Body mass index (BMI) 19 or less, adult: Secondary | ICD-10-CM | POA: Diagnosis not present

## 2020-07-09 DIAGNOSIS — G47419 Narcolepsy without cataplexy: Secondary | ICD-10-CM | POA: Diagnosis not present

## 2020-07-09 DIAGNOSIS — R296 Repeated falls: Secondary | ICD-10-CM | POA: Diagnosis not present

## 2020-07-09 DIAGNOSIS — L89153 Pressure ulcer of sacral region, stage 3: Secondary | ICD-10-CM | POA: Diagnosis not present

## 2020-07-09 DIAGNOSIS — Z8614 Personal history of Methicillin resistant Staphylococcus aureus infection: Secondary | ICD-10-CM | POA: Diagnosis not present

## 2020-07-09 DIAGNOSIS — E46 Unspecified protein-calorie malnutrition: Secondary | ICD-10-CM | POA: Diagnosis not present

## 2020-07-09 DIAGNOSIS — J449 Chronic obstructive pulmonary disease, unspecified: Secondary | ICD-10-CM | POA: Diagnosis not present

## 2020-07-10 DIAGNOSIS — D649 Anemia, unspecified: Secondary | ICD-10-CM | POA: Diagnosis not present

## 2020-07-10 DIAGNOSIS — R296 Repeated falls: Secondary | ICD-10-CM | POA: Diagnosis not present

## 2020-07-10 DIAGNOSIS — Z681 Body mass index (BMI) 19 or less, adult: Secondary | ICD-10-CM | POA: Diagnosis not present

## 2020-07-10 DIAGNOSIS — G47419 Narcolepsy without cataplexy: Secondary | ICD-10-CM | POA: Diagnosis not present

## 2020-07-10 DIAGNOSIS — I10 Essential (primary) hypertension: Secondary | ICD-10-CM | POA: Diagnosis not present

## 2020-07-10 DIAGNOSIS — L89153 Pressure ulcer of sacral region, stage 3: Secondary | ICD-10-CM | POA: Diagnosis not present

## 2020-07-10 DIAGNOSIS — J449 Chronic obstructive pulmonary disease, unspecified: Secondary | ICD-10-CM | POA: Diagnosis not present

## 2020-07-10 DIAGNOSIS — E785 Hyperlipidemia, unspecified: Secondary | ICD-10-CM | POA: Diagnosis not present

## 2020-07-10 DIAGNOSIS — Z8614 Personal history of Methicillin resistant Staphylococcus aureus infection: Secondary | ICD-10-CM | POA: Diagnosis not present

## 2020-07-10 DIAGNOSIS — E46 Unspecified protein-calorie malnutrition: Secondary | ICD-10-CM | POA: Diagnosis not present

## 2020-07-10 DIAGNOSIS — G35 Multiple sclerosis: Secondary | ICD-10-CM | POA: Diagnosis not present

## 2020-07-11 DIAGNOSIS — G47419 Narcolepsy without cataplexy: Secondary | ICD-10-CM | POA: Diagnosis not present

## 2020-07-11 DIAGNOSIS — L89153 Pressure ulcer of sacral region, stage 3: Secondary | ICD-10-CM | POA: Diagnosis not present

## 2020-07-11 DIAGNOSIS — G35 Multiple sclerosis: Secondary | ICD-10-CM | POA: Diagnosis not present

## 2020-07-11 DIAGNOSIS — E46 Unspecified protein-calorie malnutrition: Secondary | ICD-10-CM | POA: Diagnosis not present

## 2020-07-11 DIAGNOSIS — I10 Essential (primary) hypertension: Secondary | ICD-10-CM | POA: Diagnosis not present

## 2020-07-11 DIAGNOSIS — D649 Anemia, unspecified: Secondary | ICD-10-CM | POA: Diagnosis not present

## 2020-07-11 DIAGNOSIS — R296 Repeated falls: Secondary | ICD-10-CM | POA: Diagnosis not present

## 2020-07-11 DIAGNOSIS — Z8614 Personal history of Methicillin resistant Staphylococcus aureus infection: Secondary | ICD-10-CM | POA: Diagnosis not present

## 2020-07-11 DIAGNOSIS — Z681 Body mass index (BMI) 19 or less, adult: Secondary | ICD-10-CM | POA: Diagnosis not present

## 2020-07-11 DIAGNOSIS — J449 Chronic obstructive pulmonary disease, unspecified: Secondary | ICD-10-CM | POA: Diagnosis not present

## 2020-07-11 DIAGNOSIS — E785 Hyperlipidemia, unspecified: Secondary | ICD-10-CM | POA: Diagnosis not present

## 2020-07-12 DIAGNOSIS — E46 Unspecified protein-calorie malnutrition: Secondary | ICD-10-CM | POA: Diagnosis not present

## 2020-07-12 DIAGNOSIS — L89153 Pressure ulcer of sacral region, stage 3: Secondary | ICD-10-CM | POA: Diagnosis not present

## 2020-07-12 DIAGNOSIS — J449 Chronic obstructive pulmonary disease, unspecified: Secondary | ICD-10-CM | POA: Diagnosis not present

## 2020-07-12 DIAGNOSIS — Z681 Body mass index (BMI) 19 or less, adult: Secondary | ICD-10-CM | POA: Diagnosis not present

## 2020-07-12 DIAGNOSIS — G35 Multiple sclerosis: Secondary | ICD-10-CM | POA: Diagnosis not present

## 2020-07-12 DIAGNOSIS — Z8614 Personal history of Methicillin resistant Staphylococcus aureus infection: Secondary | ICD-10-CM | POA: Diagnosis not present

## 2020-07-12 DIAGNOSIS — E785 Hyperlipidemia, unspecified: Secondary | ICD-10-CM | POA: Diagnosis not present

## 2020-07-12 DIAGNOSIS — D649 Anemia, unspecified: Secondary | ICD-10-CM | POA: Diagnosis not present

## 2020-07-12 DIAGNOSIS — G47419 Narcolepsy without cataplexy: Secondary | ICD-10-CM | POA: Diagnosis not present

## 2020-07-12 DIAGNOSIS — R296 Repeated falls: Secondary | ICD-10-CM | POA: Diagnosis not present

## 2020-07-12 DIAGNOSIS — I10 Essential (primary) hypertension: Secondary | ICD-10-CM | POA: Diagnosis not present

## 2020-07-13 DIAGNOSIS — I10 Essential (primary) hypertension: Secondary | ICD-10-CM | POA: Diagnosis not present

## 2020-07-13 DIAGNOSIS — J449 Chronic obstructive pulmonary disease, unspecified: Secondary | ICD-10-CM | POA: Diagnosis not present

## 2020-07-13 DIAGNOSIS — E46 Unspecified protein-calorie malnutrition: Secondary | ICD-10-CM | POA: Diagnosis not present

## 2020-07-13 DIAGNOSIS — L89153 Pressure ulcer of sacral region, stage 3: Secondary | ICD-10-CM | POA: Diagnosis not present

## 2020-07-13 DIAGNOSIS — G47419 Narcolepsy without cataplexy: Secondary | ICD-10-CM | POA: Diagnosis not present

## 2020-07-13 DIAGNOSIS — Z681 Body mass index (BMI) 19 or less, adult: Secondary | ICD-10-CM | POA: Diagnosis not present

## 2020-07-13 DIAGNOSIS — D649 Anemia, unspecified: Secondary | ICD-10-CM | POA: Diagnosis not present

## 2020-07-13 DIAGNOSIS — G35 Multiple sclerosis: Secondary | ICD-10-CM | POA: Diagnosis not present

## 2020-07-13 DIAGNOSIS — E785 Hyperlipidemia, unspecified: Secondary | ICD-10-CM | POA: Diagnosis not present

## 2020-07-13 DIAGNOSIS — Z8614 Personal history of Methicillin resistant Staphylococcus aureus infection: Secondary | ICD-10-CM | POA: Diagnosis not present

## 2020-07-13 DIAGNOSIS — R296 Repeated falls: Secondary | ICD-10-CM | POA: Diagnosis not present

## 2020-07-14 DIAGNOSIS — E46 Unspecified protein-calorie malnutrition: Secondary | ICD-10-CM | POA: Diagnosis not present

## 2020-07-14 DIAGNOSIS — Z681 Body mass index (BMI) 19 or less, adult: Secondary | ICD-10-CM | POA: Diagnosis not present

## 2020-07-14 DIAGNOSIS — R296 Repeated falls: Secondary | ICD-10-CM | POA: Diagnosis not present

## 2020-07-14 DIAGNOSIS — J449 Chronic obstructive pulmonary disease, unspecified: Secondary | ICD-10-CM | POA: Diagnosis not present

## 2020-07-14 DIAGNOSIS — G47419 Narcolepsy without cataplexy: Secondary | ICD-10-CM | POA: Diagnosis not present

## 2020-07-14 DIAGNOSIS — L89153 Pressure ulcer of sacral region, stage 3: Secondary | ICD-10-CM | POA: Diagnosis not present

## 2020-07-14 DIAGNOSIS — E785 Hyperlipidemia, unspecified: Secondary | ICD-10-CM | POA: Diagnosis not present

## 2020-07-14 DIAGNOSIS — Z8614 Personal history of Methicillin resistant Staphylococcus aureus infection: Secondary | ICD-10-CM | POA: Diagnosis not present

## 2020-07-14 DIAGNOSIS — G35 Multiple sclerosis: Secondary | ICD-10-CM | POA: Diagnosis not present

## 2020-07-14 DIAGNOSIS — I10 Essential (primary) hypertension: Secondary | ICD-10-CM | POA: Diagnosis not present

## 2020-07-14 DIAGNOSIS — D649 Anemia, unspecified: Secondary | ICD-10-CM | POA: Diagnosis not present

## 2020-07-15 DIAGNOSIS — G47419 Narcolepsy without cataplexy: Secondary | ICD-10-CM | POA: Diagnosis not present

## 2020-07-15 DIAGNOSIS — D649 Anemia, unspecified: Secondary | ICD-10-CM | POA: Diagnosis not present

## 2020-07-15 DIAGNOSIS — L89153 Pressure ulcer of sacral region, stage 3: Secondary | ICD-10-CM | POA: Diagnosis not present

## 2020-07-15 DIAGNOSIS — I10 Essential (primary) hypertension: Secondary | ICD-10-CM | POA: Diagnosis not present

## 2020-07-15 DIAGNOSIS — E785 Hyperlipidemia, unspecified: Secondary | ICD-10-CM | POA: Diagnosis not present

## 2020-07-15 DIAGNOSIS — Z681 Body mass index (BMI) 19 or less, adult: Secondary | ICD-10-CM | POA: Diagnosis not present

## 2020-07-15 DIAGNOSIS — J449 Chronic obstructive pulmonary disease, unspecified: Secondary | ICD-10-CM | POA: Diagnosis not present

## 2020-07-15 DIAGNOSIS — R296 Repeated falls: Secondary | ICD-10-CM | POA: Diagnosis not present

## 2020-07-15 DIAGNOSIS — Z8614 Personal history of Methicillin resistant Staphylococcus aureus infection: Secondary | ICD-10-CM | POA: Diagnosis not present

## 2020-07-15 DIAGNOSIS — E46 Unspecified protein-calorie malnutrition: Secondary | ICD-10-CM | POA: Diagnosis not present

## 2020-07-15 DIAGNOSIS — G35 Multiple sclerosis: Secondary | ICD-10-CM | POA: Diagnosis not present

## 2020-07-16 DIAGNOSIS — D649 Anemia, unspecified: Secondary | ICD-10-CM | POA: Diagnosis not present

## 2020-07-16 DIAGNOSIS — R296 Repeated falls: Secondary | ICD-10-CM | POA: Diagnosis not present

## 2020-07-16 DIAGNOSIS — Z681 Body mass index (BMI) 19 or less, adult: Secondary | ICD-10-CM | POA: Diagnosis not present

## 2020-07-16 DIAGNOSIS — J449 Chronic obstructive pulmonary disease, unspecified: Secondary | ICD-10-CM | POA: Diagnosis not present

## 2020-07-16 DIAGNOSIS — I10 Essential (primary) hypertension: Secondary | ICD-10-CM | POA: Diagnosis not present

## 2020-07-16 DIAGNOSIS — Z8614 Personal history of Methicillin resistant Staphylococcus aureus infection: Secondary | ICD-10-CM | POA: Diagnosis not present

## 2020-07-16 DIAGNOSIS — E785 Hyperlipidemia, unspecified: Secondary | ICD-10-CM | POA: Diagnosis not present

## 2020-07-16 DIAGNOSIS — L89153 Pressure ulcer of sacral region, stage 3: Secondary | ICD-10-CM | POA: Diagnosis not present

## 2020-07-16 DIAGNOSIS — G47419 Narcolepsy without cataplexy: Secondary | ICD-10-CM | POA: Diagnosis not present

## 2020-07-16 DIAGNOSIS — E46 Unspecified protein-calorie malnutrition: Secondary | ICD-10-CM | POA: Diagnosis not present

## 2020-07-16 DIAGNOSIS — G35 Multiple sclerosis: Secondary | ICD-10-CM | POA: Diagnosis not present

## 2020-07-17 DIAGNOSIS — Z681 Body mass index (BMI) 19 or less, adult: Secondary | ICD-10-CM | POA: Diagnosis not present

## 2020-07-17 DIAGNOSIS — J449 Chronic obstructive pulmonary disease, unspecified: Secondary | ICD-10-CM | POA: Diagnosis not present

## 2020-07-17 DIAGNOSIS — L89153 Pressure ulcer of sacral region, stage 3: Secondary | ICD-10-CM | POA: Diagnosis not present

## 2020-07-17 DIAGNOSIS — G35 Multiple sclerosis: Secondary | ICD-10-CM | POA: Diagnosis not present

## 2020-07-17 DIAGNOSIS — I10 Essential (primary) hypertension: Secondary | ICD-10-CM | POA: Diagnosis not present

## 2020-07-17 DIAGNOSIS — E46 Unspecified protein-calorie malnutrition: Secondary | ICD-10-CM | POA: Diagnosis not present

## 2020-07-17 DIAGNOSIS — Z8614 Personal history of Methicillin resistant Staphylococcus aureus infection: Secondary | ICD-10-CM | POA: Diagnosis not present

## 2020-07-17 DIAGNOSIS — D649 Anemia, unspecified: Secondary | ICD-10-CM | POA: Diagnosis not present

## 2020-07-17 DIAGNOSIS — R296 Repeated falls: Secondary | ICD-10-CM | POA: Diagnosis not present

## 2020-07-17 DIAGNOSIS — G47419 Narcolepsy without cataplexy: Secondary | ICD-10-CM | POA: Diagnosis not present

## 2020-07-17 DIAGNOSIS — E785 Hyperlipidemia, unspecified: Secondary | ICD-10-CM | POA: Diagnosis not present

## 2020-07-18 DIAGNOSIS — E785 Hyperlipidemia, unspecified: Secondary | ICD-10-CM | POA: Diagnosis not present

## 2020-07-18 DIAGNOSIS — I10 Essential (primary) hypertension: Secondary | ICD-10-CM | POA: Diagnosis not present

## 2020-07-18 DIAGNOSIS — Z8614 Personal history of Methicillin resistant Staphylococcus aureus infection: Secondary | ICD-10-CM | POA: Diagnosis not present

## 2020-07-18 DIAGNOSIS — R296 Repeated falls: Secondary | ICD-10-CM | POA: Diagnosis not present

## 2020-07-18 DIAGNOSIS — J449 Chronic obstructive pulmonary disease, unspecified: Secondary | ICD-10-CM | POA: Diagnosis not present

## 2020-07-18 DIAGNOSIS — L89153 Pressure ulcer of sacral region, stage 3: Secondary | ICD-10-CM | POA: Diagnosis not present

## 2020-07-18 DIAGNOSIS — G47419 Narcolepsy without cataplexy: Secondary | ICD-10-CM | POA: Diagnosis not present

## 2020-07-18 DIAGNOSIS — G35 Multiple sclerosis: Secondary | ICD-10-CM | POA: Diagnosis not present

## 2020-07-18 DIAGNOSIS — Z681 Body mass index (BMI) 19 or less, adult: Secondary | ICD-10-CM | POA: Diagnosis not present

## 2020-07-18 DIAGNOSIS — D649 Anemia, unspecified: Secondary | ICD-10-CM | POA: Diagnosis not present

## 2020-07-18 DIAGNOSIS — E46 Unspecified protein-calorie malnutrition: Secondary | ICD-10-CM | POA: Diagnosis not present

## 2020-07-19 DIAGNOSIS — Z8614 Personal history of Methicillin resistant Staphylococcus aureus infection: Secondary | ICD-10-CM | POA: Diagnosis not present

## 2020-07-19 DIAGNOSIS — E46 Unspecified protein-calorie malnutrition: Secondary | ICD-10-CM | POA: Diagnosis not present

## 2020-07-19 DIAGNOSIS — E785 Hyperlipidemia, unspecified: Secondary | ICD-10-CM | POA: Diagnosis not present

## 2020-07-19 DIAGNOSIS — I10 Essential (primary) hypertension: Secondary | ICD-10-CM | POA: Diagnosis not present

## 2020-07-19 DIAGNOSIS — G47419 Narcolepsy without cataplexy: Secondary | ICD-10-CM | POA: Diagnosis not present

## 2020-07-19 DIAGNOSIS — D649 Anemia, unspecified: Secondary | ICD-10-CM | POA: Diagnosis not present

## 2020-07-19 DIAGNOSIS — L89153 Pressure ulcer of sacral region, stage 3: Secondary | ICD-10-CM | POA: Diagnosis not present

## 2020-07-19 DIAGNOSIS — Z681 Body mass index (BMI) 19 or less, adult: Secondary | ICD-10-CM | POA: Diagnosis not present

## 2020-07-19 DIAGNOSIS — J449 Chronic obstructive pulmonary disease, unspecified: Secondary | ICD-10-CM | POA: Diagnosis not present

## 2020-07-19 DIAGNOSIS — R296 Repeated falls: Secondary | ICD-10-CM | POA: Diagnosis not present

## 2020-07-19 DIAGNOSIS — G35 Multiple sclerosis: Secondary | ICD-10-CM | POA: Diagnosis not present

## 2020-07-20 DIAGNOSIS — E46 Unspecified protein-calorie malnutrition: Secondary | ICD-10-CM | POA: Diagnosis not present

## 2020-07-20 DIAGNOSIS — E785 Hyperlipidemia, unspecified: Secondary | ICD-10-CM | POA: Diagnosis not present

## 2020-07-20 DIAGNOSIS — D649 Anemia, unspecified: Secondary | ICD-10-CM | POA: Diagnosis not present

## 2020-07-20 DIAGNOSIS — L89153 Pressure ulcer of sacral region, stage 3: Secondary | ICD-10-CM | POA: Diagnosis not present

## 2020-07-20 DIAGNOSIS — G47419 Narcolepsy without cataplexy: Secondary | ICD-10-CM | POA: Diagnosis not present

## 2020-07-20 DIAGNOSIS — Z8614 Personal history of Methicillin resistant Staphylococcus aureus infection: Secondary | ICD-10-CM | POA: Diagnosis not present

## 2020-07-20 DIAGNOSIS — I10 Essential (primary) hypertension: Secondary | ICD-10-CM | POA: Diagnosis not present

## 2020-07-20 DIAGNOSIS — J449 Chronic obstructive pulmonary disease, unspecified: Secondary | ICD-10-CM | POA: Diagnosis not present

## 2020-07-20 DIAGNOSIS — Z681 Body mass index (BMI) 19 or less, adult: Secondary | ICD-10-CM | POA: Diagnosis not present

## 2020-07-20 DIAGNOSIS — R296 Repeated falls: Secondary | ICD-10-CM | POA: Diagnosis not present

## 2020-07-20 DIAGNOSIS — G35 Multiple sclerosis: Secondary | ICD-10-CM | POA: Diagnosis not present

## 2020-07-21 DIAGNOSIS — E46 Unspecified protein-calorie malnutrition: Secondary | ICD-10-CM | POA: Diagnosis not present

## 2020-07-21 DIAGNOSIS — D649 Anemia, unspecified: Secondary | ICD-10-CM | POA: Diagnosis not present

## 2020-07-21 DIAGNOSIS — L89153 Pressure ulcer of sacral region, stage 3: Secondary | ICD-10-CM | POA: Diagnosis not present

## 2020-07-21 DIAGNOSIS — G35 Multiple sclerosis: Secondary | ICD-10-CM | POA: Diagnosis not present

## 2020-07-21 DIAGNOSIS — E785 Hyperlipidemia, unspecified: Secondary | ICD-10-CM | POA: Diagnosis not present

## 2020-07-21 DIAGNOSIS — Z681 Body mass index (BMI) 19 or less, adult: Secondary | ICD-10-CM | POA: Diagnosis not present

## 2020-07-21 DIAGNOSIS — G47419 Narcolepsy without cataplexy: Secondary | ICD-10-CM | POA: Diagnosis not present

## 2020-07-21 DIAGNOSIS — Z8614 Personal history of Methicillin resistant Staphylococcus aureus infection: Secondary | ICD-10-CM | POA: Diagnosis not present

## 2020-07-21 DIAGNOSIS — I10 Essential (primary) hypertension: Secondary | ICD-10-CM | POA: Diagnosis not present

## 2020-07-21 DIAGNOSIS — J449 Chronic obstructive pulmonary disease, unspecified: Secondary | ICD-10-CM | POA: Diagnosis not present

## 2020-07-21 DIAGNOSIS — R296 Repeated falls: Secondary | ICD-10-CM | POA: Diagnosis not present

## 2020-07-22 DIAGNOSIS — Z8614 Personal history of Methicillin resistant Staphylococcus aureus infection: Secondary | ICD-10-CM | POA: Diagnosis not present

## 2020-07-22 DIAGNOSIS — Z681 Body mass index (BMI) 19 or less, adult: Secondary | ICD-10-CM | POA: Diagnosis not present

## 2020-07-22 DIAGNOSIS — G47419 Narcolepsy without cataplexy: Secondary | ICD-10-CM | POA: Diagnosis not present

## 2020-07-22 DIAGNOSIS — J449 Chronic obstructive pulmonary disease, unspecified: Secondary | ICD-10-CM | POA: Diagnosis not present

## 2020-07-22 DIAGNOSIS — D649 Anemia, unspecified: Secondary | ICD-10-CM | POA: Diagnosis not present

## 2020-07-22 DIAGNOSIS — E785 Hyperlipidemia, unspecified: Secondary | ICD-10-CM | POA: Diagnosis not present

## 2020-07-22 DIAGNOSIS — E46 Unspecified protein-calorie malnutrition: Secondary | ICD-10-CM | POA: Diagnosis not present

## 2020-07-22 DIAGNOSIS — I10 Essential (primary) hypertension: Secondary | ICD-10-CM | POA: Diagnosis not present

## 2020-07-22 DIAGNOSIS — L89153 Pressure ulcer of sacral region, stage 3: Secondary | ICD-10-CM | POA: Diagnosis not present

## 2020-07-22 DIAGNOSIS — R296 Repeated falls: Secondary | ICD-10-CM | POA: Diagnosis not present

## 2020-07-22 DIAGNOSIS — G35 Multiple sclerosis: Secondary | ICD-10-CM | POA: Diagnosis not present

## 2020-07-23 DIAGNOSIS — E785 Hyperlipidemia, unspecified: Secondary | ICD-10-CM | POA: Diagnosis not present

## 2020-07-23 DIAGNOSIS — J449 Chronic obstructive pulmonary disease, unspecified: Secondary | ICD-10-CM | POA: Diagnosis not present

## 2020-07-23 DIAGNOSIS — L89153 Pressure ulcer of sacral region, stage 3: Secondary | ICD-10-CM | POA: Diagnosis not present

## 2020-07-23 DIAGNOSIS — G47419 Narcolepsy without cataplexy: Secondary | ICD-10-CM | POA: Diagnosis not present

## 2020-07-23 DIAGNOSIS — E46 Unspecified protein-calorie malnutrition: Secondary | ICD-10-CM | POA: Diagnosis not present

## 2020-07-23 DIAGNOSIS — Z8614 Personal history of Methicillin resistant Staphylococcus aureus infection: Secondary | ICD-10-CM | POA: Diagnosis not present

## 2020-07-23 DIAGNOSIS — Z681 Body mass index (BMI) 19 or less, adult: Secondary | ICD-10-CM | POA: Diagnosis not present

## 2020-07-23 DIAGNOSIS — G35 Multiple sclerosis: Secondary | ICD-10-CM | POA: Diagnosis not present

## 2020-07-23 DIAGNOSIS — I10 Essential (primary) hypertension: Secondary | ICD-10-CM | POA: Diagnosis not present

## 2020-07-23 DIAGNOSIS — R296 Repeated falls: Secondary | ICD-10-CM | POA: Diagnosis not present

## 2020-07-23 DIAGNOSIS — D649 Anemia, unspecified: Secondary | ICD-10-CM | POA: Diagnosis not present

## 2020-07-24 ENCOUNTER — Other Ambulatory Visit (INDEPENDENT_AMBULATORY_CARE_PROVIDER_SITE_OTHER): Payer: Self-pay | Admitting: Internal Medicine

## 2020-07-24 DIAGNOSIS — R296 Repeated falls: Secondary | ICD-10-CM | POA: Diagnosis not present

## 2020-07-24 DIAGNOSIS — E785 Hyperlipidemia, unspecified: Secondary | ICD-10-CM | POA: Diagnosis not present

## 2020-07-24 DIAGNOSIS — D649 Anemia, unspecified: Secondary | ICD-10-CM | POA: Diagnosis not present

## 2020-07-24 DIAGNOSIS — J449 Chronic obstructive pulmonary disease, unspecified: Secondary | ICD-10-CM | POA: Diagnosis not present

## 2020-07-24 DIAGNOSIS — Z8614 Personal history of Methicillin resistant Staphylococcus aureus infection: Secondary | ICD-10-CM | POA: Diagnosis not present

## 2020-07-24 DIAGNOSIS — L89153 Pressure ulcer of sacral region, stage 3: Secondary | ICD-10-CM | POA: Diagnosis not present

## 2020-07-24 DIAGNOSIS — G35 Multiple sclerosis: Secondary | ICD-10-CM | POA: Diagnosis not present

## 2020-07-24 DIAGNOSIS — I10 Essential (primary) hypertension: Secondary | ICD-10-CM | POA: Diagnosis not present

## 2020-07-24 DIAGNOSIS — Z681 Body mass index (BMI) 19 or less, adult: Secondary | ICD-10-CM | POA: Diagnosis not present

## 2020-07-24 DIAGNOSIS — G47419 Narcolepsy without cataplexy: Secondary | ICD-10-CM | POA: Diagnosis not present

## 2020-07-24 DIAGNOSIS — E46 Unspecified protein-calorie malnutrition: Secondary | ICD-10-CM | POA: Diagnosis not present

## 2020-07-24 NOTE — Telephone Encounter (Signed)
refill 

## 2020-07-25 DIAGNOSIS — E46 Unspecified protein-calorie malnutrition: Secondary | ICD-10-CM | POA: Diagnosis not present

## 2020-07-25 DIAGNOSIS — Z681 Body mass index (BMI) 19 or less, adult: Secondary | ICD-10-CM | POA: Diagnosis not present

## 2020-07-25 DIAGNOSIS — I10 Essential (primary) hypertension: Secondary | ICD-10-CM | POA: Diagnosis not present

## 2020-07-25 DIAGNOSIS — G35 Multiple sclerosis: Secondary | ICD-10-CM | POA: Diagnosis not present

## 2020-07-25 DIAGNOSIS — G47419 Narcolepsy without cataplexy: Secondary | ICD-10-CM | POA: Diagnosis not present

## 2020-07-25 DIAGNOSIS — D649 Anemia, unspecified: Secondary | ICD-10-CM | POA: Diagnosis not present

## 2020-07-25 DIAGNOSIS — R296 Repeated falls: Secondary | ICD-10-CM | POA: Diagnosis not present

## 2020-07-25 DIAGNOSIS — Z8614 Personal history of Methicillin resistant Staphylococcus aureus infection: Secondary | ICD-10-CM | POA: Diagnosis not present

## 2020-07-25 DIAGNOSIS — J449 Chronic obstructive pulmonary disease, unspecified: Secondary | ICD-10-CM | POA: Diagnosis not present

## 2020-07-25 DIAGNOSIS — L89153 Pressure ulcer of sacral region, stage 3: Secondary | ICD-10-CM | POA: Diagnosis not present

## 2020-07-25 DIAGNOSIS — E785 Hyperlipidemia, unspecified: Secondary | ICD-10-CM | POA: Diagnosis not present

## 2020-07-26 DIAGNOSIS — Z681 Body mass index (BMI) 19 or less, adult: Secondary | ICD-10-CM | POA: Diagnosis not present

## 2020-07-26 DIAGNOSIS — E785 Hyperlipidemia, unspecified: Secondary | ICD-10-CM | POA: Diagnosis not present

## 2020-07-26 DIAGNOSIS — J449 Chronic obstructive pulmonary disease, unspecified: Secondary | ICD-10-CM | POA: Diagnosis not present

## 2020-07-26 DIAGNOSIS — R296 Repeated falls: Secondary | ICD-10-CM | POA: Diagnosis not present

## 2020-07-26 DIAGNOSIS — G35 Multiple sclerosis: Secondary | ICD-10-CM | POA: Diagnosis not present

## 2020-07-26 DIAGNOSIS — E46 Unspecified protein-calorie malnutrition: Secondary | ICD-10-CM | POA: Diagnosis not present

## 2020-07-26 DIAGNOSIS — I10 Essential (primary) hypertension: Secondary | ICD-10-CM | POA: Diagnosis not present

## 2020-07-26 DIAGNOSIS — L89153 Pressure ulcer of sacral region, stage 3: Secondary | ICD-10-CM | POA: Diagnosis not present

## 2020-07-26 DIAGNOSIS — Z8614 Personal history of Methicillin resistant Staphylococcus aureus infection: Secondary | ICD-10-CM | POA: Diagnosis not present

## 2020-07-26 DIAGNOSIS — D649 Anemia, unspecified: Secondary | ICD-10-CM | POA: Diagnosis not present

## 2020-07-26 DIAGNOSIS — G47419 Narcolepsy without cataplexy: Secondary | ICD-10-CM | POA: Diagnosis not present

## 2020-07-27 DIAGNOSIS — E46 Unspecified protein-calorie malnutrition: Secondary | ICD-10-CM | POA: Diagnosis not present

## 2020-07-27 DIAGNOSIS — L89153 Pressure ulcer of sacral region, stage 3: Secondary | ICD-10-CM | POA: Diagnosis not present

## 2020-07-27 DIAGNOSIS — J449 Chronic obstructive pulmonary disease, unspecified: Secondary | ICD-10-CM | POA: Diagnosis not present

## 2020-07-27 DIAGNOSIS — E785 Hyperlipidemia, unspecified: Secondary | ICD-10-CM | POA: Diagnosis not present

## 2020-07-27 DIAGNOSIS — Z8614 Personal history of Methicillin resistant Staphylococcus aureus infection: Secondary | ICD-10-CM | POA: Diagnosis not present

## 2020-07-27 DIAGNOSIS — G47419 Narcolepsy without cataplexy: Secondary | ICD-10-CM | POA: Diagnosis not present

## 2020-07-27 DIAGNOSIS — R296 Repeated falls: Secondary | ICD-10-CM | POA: Diagnosis not present

## 2020-07-27 DIAGNOSIS — I10 Essential (primary) hypertension: Secondary | ICD-10-CM | POA: Diagnosis not present

## 2020-07-27 DIAGNOSIS — Z681 Body mass index (BMI) 19 or less, adult: Secondary | ICD-10-CM | POA: Diagnosis not present

## 2020-07-27 DIAGNOSIS — D649 Anemia, unspecified: Secondary | ICD-10-CM | POA: Diagnosis not present

## 2020-07-27 DIAGNOSIS — G35 Multiple sclerosis: Secondary | ICD-10-CM | POA: Diagnosis not present

## 2020-07-28 DIAGNOSIS — J449 Chronic obstructive pulmonary disease, unspecified: Secondary | ICD-10-CM | POA: Diagnosis not present

## 2020-07-28 DIAGNOSIS — D649 Anemia, unspecified: Secondary | ICD-10-CM | POA: Diagnosis not present

## 2020-07-28 DIAGNOSIS — Z681 Body mass index (BMI) 19 or less, adult: Secondary | ICD-10-CM | POA: Diagnosis not present

## 2020-07-28 DIAGNOSIS — E46 Unspecified protein-calorie malnutrition: Secondary | ICD-10-CM | POA: Diagnosis not present

## 2020-07-28 DIAGNOSIS — L89153 Pressure ulcer of sacral region, stage 3: Secondary | ICD-10-CM | POA: Diagnosis not present

## 2020-07-28 DIAGNOSIS — G47419 Narcolepsy without cataplexy: Secondary | ICD-10-CM | POA: Diagnosis not present

## 2020-07-28 DIAGNOSIS — E785 Hyperlipidemia, unspecified: Secondary | ICD-10-CM | POA: Diagnosis not present

## 2020-07-28 DIAGNOSIS — I10 Essential (primary) hypertension: Secondary | ICD-10-CM | POA: Diagnosis not present

## 2020-07-28 DIAGNOSIS — G35 Multiple sclerosis: Secondary | ICD-10-CM | POA: Diagnosis not present

## 2020-07-28 DIAGNOSIS — R296 Repeated falls: Secondary | ICD-10-CM | POA: Diagnosis not present

## 2020-07-28 DIAGNOSIS — Z8614 Personal history of Methicillin resistant Staphylococcus aureus infection: Secondary | ICD-10-CM | POA: Diagnosis not present

## 2020-07-29 DIAGNOSIS — E785 Hyperlipidemia, unspecified: Secondary | ICD-10-CM | POA: Diagnosis not present

## 2020-07-29 DIAGNOSIS — J449 Chronic obstructive pulmonary disease, unspecified: Secondary | ICD-10-CM | POA: Diagnosis not present

## 2020-07-29 DIAGNOSIS — E46 Unspecified protein-calorie malnutrition: Secondary | ICD-10-CM | POA: Diagnosis not present

## 2020-07-29 DIAGNOSIS — G47419 Narcolepsy without cataplexy: Secondary | ICD-10-CM | POA: Diagnosis not present

## 2020-07-29 DIAGNOSIS — R296 Repeated falls: Secondary | ICD-10-CM | POA: Diagnosis not present

## 2020-07-29 DIAGNOSIS — Z681 Body mass index (BMI) 19 or less, adult: Secondary | ICD-10-CM | POA: Diagnosis not present

## 2020-07-29 DIAGNOSIS — D649 Anemia, unspecified: Secondary | ICD-10-CM | POA: Diagnosis not present

## 2020-07-29 DIAGNOSIS — I10 Essential (primary) hypertension: Secondary | ICD-10-CM | POA: Diagnosis not present

## 2020-07-29 DIAGNOSIS — L89153 Pressure ulcer of sacral region, stage 3: Secondary | ICD-10-CM | POA: Diagnosis not present

## 2020-07-29 DIAGNOSIS — Z8614 Personal history of Methicillin resistant Staphylococcus aureus infection: Secondary | ICD-10-CM | POA: Diagnosis not present

## 2020-07-29 DIAGNOSIS — G35 Multiple sclerosis: Secondary | ICD-10-CM | POA: Diagnosis not present

## 2020-07-30 DIAGNOSIS — J449 Chronic obstructive pulmonary disease, unspecified: Secondary | ICD-10-CM | POA: Diagnosis not present

## 2020-07-30 DIAGNOSIS — Z8614 Personal history of Methicillin resistant Staphylococcus aureus infection: Secondary | ICD-10-CM | POA: Diagnosis not present

## 2020-07-30 DIAGNOSIS — I10 Essential (primary) hypertension: Secondary | ICD-10-CM | POA: Diagnosis not present

## 2020-07-30 DIAGNOSIS — R296 Repeated falls: Secondary | ICD-10-CM | POA: Diagnosis not present

## 2020-07-30 DIAGNOSIS — D649 Anemia, unspecified: Secondary | ICD-10-CM | POA: Diagnosis not present

## 2020-07-30 DIAGNOSIS — E46 Unspecified protein-calorie malnutrition: Secondary | ICD-10-CM | POA: Diagnosis not present

## 2020-07-30 DIAGNOSIS — G35 Multiple sclerosis: Secondary | ICD-10-CM | POA: Diagnosis not present

## 2020-07-30 DIAGNOSIS — E785 Hyperlipidemia, unspecified: Secondary | ICD-10-CM | POA: Diagnosis not present

## 2020-07-30 DIAGNOSIS — L89153 Pressure ulcer of sacral region, stage 3: Secondary | ICD-10-CM | POA: Diagnosis not present

## 2020-07-30 DIAGNOSIS — Z681 Body mass index (BMI) 19 or less, adult: Secondary | ICD-10-CM | POA: Diagnosis not present

## 2020-07-30 DIAGNOSIS — G47419 Narcolepsy without cataplexy: Secondary | ICD-10-CM | POA: Diagnosis not present

## 2020-07-31 DIAGNOSIS — E785 Hyperlipidemia, unspecified: Secondary | ICD-10-CM | POA: Diagnosis not present

## 2020-07-31 DIAGNOSIS — R296 Repeated falls: Secondary | ICD-10-CM | POA: Diagnosis not present

## 2020-07-31 DIAGNOSIS — Z681 Body mass index (BMI) 19 or less, adult: Secondary | ICD-10-CM | POA: Diagnosis not present

## 2020-07-31 DIAGNOSIS — D649 Anemia, unspecified: Secondary | ICD-10-CM | POA: Diagnosis not present

## 2020-07-31 DIAGNOSIS — J449 Chronic obstructive pulmonary disease, unspecified: Secondary | ICD-10-CM | POA: Diagnosis not present

## 2020-07-31 DIAGNOSIS — L89153 Pressure ulcer of sacral region, stage 3: Secondary | ICD-10-CM | POA: Diagnosis not present

## 2020-07-31 DIAGNOSIS — G35 Multiple sclerosis: Secondary | ICD-10-CM | POA: Diagnosis not present

## 2020-07-31 DIAGNOSIS — I10 Essential (primary) hypertension: Secondary | ICD-10-CM | POA: Diagnosis not present

## 2020-07-31 DIAGNOSIS — Z8614 Personal history of Methicillin resistant Staphylococcus aureus infection: Secondary | ICD-10-CM | POA: Diagnosis not present

## 2020-07-31 DIAGNOSIS — E46 Unspecified protein-calorie malnutrition: Secondary | ICD-10-CM | POA: Diagnosis not present

## 2020-07-31 DIAGNOSIS — G47419 Narcolepsy without cataplexy: Secondary | ICD-10-CM | POA: Diagnosis not present

## 2020-08-01 DIAGNOSIS — R296 Repeated falls: Secondary | ICD-10-CM | POA: Diagnosis not present

## 2020-08-01 DIAGNOSIS — E785 Hyperlipidemia, unspecified: Secondary | ICD-10-CM | POA: Diagnosis not present

## 2020-08-01 DIAGNOSIS — I10 Essential (primary) hypertension: Secondary | ICD-10-CM | POA: Diagnosis not present

## 2020-08-01 DIAGNOSIS — G35 Multiple sclerosis: Secondary | ICD-10-CM | POA: Diagnosis not present

## 2020-08-01 DIAGNOSIS — Z681 Body mass index (BMI) 19 or less, adult: Secondary | ICD-10-CM | POA: Diagnosis not present

## 2020-08-01 DIAGNOSIS — D649 Anemia, unspecified: Secondary | ICD-10-CM | POA: Diagnosis not present

## 2020-08-01 DIAGNOSIS — J449 Chronic obstructive pulmonary disease, unspecified: Secondary | ICD-10-CM | POA: Diagnosis not present

## 2020-08-01 DIAGNOSIS — G47419 Narcolepsy without cataplexy: Secondary | ICD-10-CM | POA: Diagnosis not present

## 2020-08-01 DIAGNOSIS — L89153 Pressure ulcer of sacral region, stage 3: Secondary | ICD-10-CM | POA: Diagnosis not present

## 2020-08-01 DIAGNOSIS — E46 Unspecified protein-calorie malnutrition: Secondary | ICD-10-CM | POA: Diagnosis not present

## 2020-08-01 DIAGNOSIS — Z8614 Personal history of Methicillin resistant Staphylococcus aureus infection: Secondary | ICD-10-CM | POA: Diagnosis not present

## 2020-08-02 DIAGNOSIS — Z8614 Personal history of Methicillin resistant Staphylococcus aureus infection: Secondary | ICD-10-CM | POA: Diagnosis not present

## 2020-08-02 DIAGNOSIS — L89153 Pressure ulcer of sacral region, stage 3: Secondary | ICD-10-CM | POA: Diagnosis not present

## 2020-08-02 DIAGNOSIS — J449 Chronic obstructive pulmonary disease, unspecified: Secondary | ICD-10-CM | POA: Diagnosis not present

## 2020-08-02 DIAGNOSIS — E46 Unspecified protein-calorie malnutrition: Secondary | ICD-10-CM | POA: Diagnosis not present

## 2020-08-02 DIAGNOSIS — G47419 Narcolepsy without cataplexy: Secondary | ICD-10-CM | POA: Diagnosis not present

## 2020-08-02 DIAGNOSIS — E785 Hyperlipidemia, unspecified: Secondary | ICD-10-CM | POA: Diagnosis not present

## 2020-08-02 DIAGNOSIS — D649 Anemia, unspecified: Secondary | ICD-10-CM | POA: Diagnosis not present

## 2020-08-02 DIAGNOSIS — R296 Repeated falls: Secondary | ICD-10-CM | POA: Diagnosis not present

## 2020-08-02 DIAGNOSIS — Z681 Body mass index (BMI) 19 or less, adult: Secondary | ICD-10-CM | POA: Diagnosis not present

## 2020-08-02 DIAGNOSIS — I10 Essential (primary) hypertension: Secondary | ICD-10-CM | POA: Diagnosis not present

## 2020-08-02 DIAGNOSIS — G35 Multiple sclerosis: Secondary | ICD-10-CM | POA: Diagnosis not present

## 2020-08-03 DIAGNOSIS — E785 Hyperlipidemia, unspecified: Secondary | ICD-10-CM | POA: Diagnosis not present

## 2020-08-03 DIAGNOSIS — Z681 Body mass index (BMI) 19 or less, adult: Secondary | ICD-10-CM | POA: Diagnosis not present

## 2020-08-03 DIAGNOSIS — R296 Repeated falls: Secondary | ICD-10-CM | POA: Diagnosis not present

## 2020-08-03 DIAGNOSIS — I10 Essential (primary) hypertension: Secondary | ICD-10-CM | POA: Diagnosis not present

## 2020-08-03 DIAGNOSIS — L89153 Pressure ulcer of sacral region, stage 3: Secondary | ICD-10-CM | POA: Diagnosis not present

## 2020-08-03 DIAGNOSIS — D649 Anemia, unspecified: Secondary | ICD-10-CM | POA: Diagnosis not present

## 2020-08-03 DIAGNOSIS — J449 Chronic obstructive pulmonary disease, unspecified: Secondary | ICD-10-CM | POA: Diagnosis not present

## 2020-08-03 DIAGNOSIS — E46 Unspecified protein-calorie malnutrition: Secondary | ICD-10-CM | POA: Diagnosis not present

## 2020-08-03 DIAGNOSIS — G47419 Narcolepsy without cataplexy: Secondary | ICD-10-CM | POA: Diagnosis not present

## 2020-08-03 DIAGNOSIS — G35 Multiple sclerosis: Secondary | ICD-10-CM | POA: Diagnosis not present

## 2020-08-03 DIAGNOSIS — Z8614 Personal history of Methicillin resistant Staphylococcus aureus infection: Secondary | ICD-10-CM | POA: Diagnosis not present

## 2020-08-04 DIAGNOSIS — D649 Anemia, unspecified: Secondary | ICD-10-CM | POA: Diagnosis not present

## 2020-08-04 DIAGNOSIS — G47419 Narcolepsy without cataplexy: Secondary | ICD-10-CM | POA: Diagnosis not present

## 2020-08-04 DIAGNOSIS — Z681 Body mass index (BMI) 19 or less, adult: Secondary | ICD-10-CM | POA: Diagnosis not present

## 2020-08-04 DIAGNOSIS — G35 Multiple sclerosis: Secondary | ICD-10-CM | POA: Diagnosis not present

## 2020-08-04 DIAGNOSIS — I10 Essential (primary) hypertension: Secondary | ICD-10-CM | POA: Diagnosis not present

## 2020-08-04 DIAGNOSIS — L89153 Pressure ulcer of sacral region, stage 3: Secondary | ICD-10-CM | POA: Diagnosis not present

## 2020-08-04 DIAGNOSIS — Z8614 Personal history of Methicillin resistant Staphylococcus aureus infection: Secondary | ICD-10-CM | POA: Diagnosis not present

## 2020-08-04 DIAGNOSIS — R296 Repeated falls: Secondary | ICD-10-CM | POA: Diagnosis not present

## 2020-08-04 DIAGNOSIS — J449 Chronic obstructive pulmonary disease, unspecified: Secondary | ICD-10-CM | POA: Diagnosis not present

## 2020-08-04 DIAGNOSIS — E46 Unspecified protein-calorie malnutrition: Secondary | ICD-10-CM | POA: Diagnosis not present

## 2020-08-04 DIAGNOSIS — E785 Hyperlipidemia, unspecified: Secondary | ICD-10-CM | POA: Diagnosis not present

## 2020-08-05 DIAGNOSIS — L89153 Pressure ulcer of sacral region, stage 3: Secondary | ICD-10-CM | POA: Diagnosis not present

## 2020-08-05 DIAGNOSIS — E785 Hyperlipidemia, unspecified: Secondary | ICD-10-CM | POA: Diagnosis not present

## 2020-08-05 DIAGNOSIS — Z8614 Personal history of Methicillin resistant Staphylococcus aureus infection: Secondary | ICD-10-CM | POA: Diagnosis not present

## 2020-08-05 DIAGNOSIS — D649 Anemia, unspecified: Secondary | ICD-10-CM | POA: Diagnosis not present

## 2020-08-05 DIAGNOSIS — Z681 Body mass index (BMI) 19 or less, adult: Secondary | ICD-10-CM | POA: Diagnosis not present

## 2020-08-05 DIAGNOSIS — G35 Multiple sclerosis: Secondary | ICD-10-CM | POA: Diagnosis not present

## 2020-08-05 DIAGNOSIS — G47419 Narcolepsy without cataplexy: Secondary | ICD-10-CM | POA: Diagnosis not present

## 2020-08-05 DIAGNOSIS — J449 Chronic obstructive pulmonary disease, unspecified: Secondary | ICD-10-CM | POA: Diagnosis not present

## 2020-08-05 DIAGNOSIS — I10 Essential (primary) hypertension: Secondary | ICD-10-CM | POA: Diagnosis not present

## 2020-08-05 DIAGNOSIS — R296 Repeated falls: Secondary | ICD-10-CM | POA: Diagnosis not present

## 2020-08-05 DIAGNOSIS — E46 Unspecified protein-calorie malnutrition: Secondary | ICD-10-CM | POA: Diagnosis not present

## 2020-08-06 DIAGNOSIS — G47419 Narcolepsy without cataplexy: Secondary | ICD-10-CM | POA: Diagnosis not present

## 2020-08-06 DIAGNOSIS — L89153 Pressure ulcer of sacral region, stage 3: Secondary | ICD-10-CM | POA: Diagnosis not present

## 2020-08-06 DIAGNOSIS — E46 Unspecified protein-calorie malnutrition: Secondary | ICD-10-CM | POA: Diagnosis not present

## 2020-08-06 DIAGNOSIS — Z8614 Personal history of Methicillin resistant Staphylococcus aureus infection: Secondary | ICD-10-CM | POA: Diagnosis not present

## 2020-08-06 DIAGNOSIS — Z681 Body mass index (BMI) 19 or less, adult: Secondary | ICD-10-CM | POA: Diagnosis not present

## 2020-08-06 DIAGNOSIS — E785 Hyperlipidemia, unspecified: Secondary | ICD-10-CM | POA: Diagnosis not present

## 2020-08-06 DIAGNOSIS — G35 Multiple sclerosis: Secondary | ICD-10-CM | POA: Diagnosis not present

## 2020-08-06 DIAGNOSIS — R296 Repeated falls: Secondary | ICD-10-CM | POA: Diagnosis not present

## 2020-08-06 DIAGNOSIS — J449 Chronic obstructive pulmonary disease, unspecified: Secondary | ICD-10-CM | POA: Diagnosis not present

## 2020-08-06 DIAGNOSIS — I10 Essential (primary) hypertension: Secondary | ICD-10-CM | POA: Diagnosis not present

## 2020-08-06 DIAGNOSIS — D649 Anemia, unspecified: Secondary | ICD-10-CM | POA: Diagnosis not present

## 2020-08-07 DIAGNOSIS — D649 Anemia, unspecified: Secondary | ICD-10-CM | POA: Diagnosis not present

## 2020-08-07 DIAGNOSIS — E46 Unspecified protein-calorie malnutrition: Secondary | ICD-10-CM | POA: Diagnosis not present

## 2020-08-07 DIAGNOSIS — G47419 Narcolepsy without cataplexy: Secondary | ICD-10-CM | POA: Diagnosis not present

## 2020-08-07 DIAGNOSIS — I10 Essential (primary) hypertension: Secondary | ICD-10-CM | POA: Diagnosis not present

## 2020-08-07 DIAGNOSIS — G35 Multiple sclerosis: Secondary | ICD-10-CM | POA: Diagnosis not present

## 2020-08-07 DIAGNOSIS — Z8614 Personal history of Methicillin resistant Staphylococcus aureus infection: Secondary | ICD-10-CM | POA: Diagnosis not present

## 2020-08-07 DIAGNOSIS — R296 Repeated falls: Secondary | ICD-10-CM | POA: Diagnosis not present

## 2020-08-07 DIAGNOSIS — Z681 Body mass index (BMI) 19 or less, adult: Secondary | ICD-10-CM | POA: Diagnosis not present

## 2020-08-07 DIAGNOSIS — L89153 Pressure ulcer of sacral region, stage 3: Secondary | ICD-10-CM | POA: Diagnosis not present

## 2020-08-07 DIAGNOSIS — E785 Hyperlipidemia, unspecified: Secondary | ICD-10-CM | POA: Diagnosis not present

## 2020-08-07 DIAGNOSIS — J449 Chronic obstructive pulmonary disease, unspecified: Secondary | ICD-10-CM | POA: Diagnosis not present

## 2020-08-08 DIAGNOSIS — I10 Essential (primary) hypertension: Secondary | ICD-10-CM | POA: Diagnosis not present

## 2020-08-08 DIAGNOSIS — R296 Repeated falls: Secondary | ICD-10-CM | POA: Diagnosis not present

## 2020-08-08 DIAGNOSIS — G35 Multiple sclerosis: Secondary | ICD-10-CM | POA: Diagnosis not present

## 2020-08-08 DIAGNOSIS — Z8614 Personal history of Methicillin resistant Staphylococcus aureus infection: Secondary | ICD-10-CM | POA: Diagnosis not present

## 2020-08-08 DIAGNOSIS — G47419 Narcolepsy without cataplexy: Secondary | ICD-10-CM | POA: Diagnosis not present

## 2020-08-08 DIAGNOSIS — L89153 Pressure ulcer of sacral region, stage 3: Secondary | ICD-10-CM | POA: Diagnosis not present

## 2020-08-08 DIAGNOSIS — J449 Chronic obstructive pulmonary disease, unspecified: Secondary | ICD-10-CM | POA: Diagnosis not present

## 2020-08-08 DIAGNOSIS — Z681 Body mass index (BMI) 19 or less, adult: Secondary | ICD-10-CM | POA: Diagnosis not present

## 2020-08-08 DIAGNOSIS — D649 Anemia, unspecified: Secondary | ICD-10-CM | POA: Diagnosis not present

## 2020-08-08 DIAGNOSIS — E785 Hyperlipidemia, unspecified: Secondary | ICD-10-CM | POA: Diagnosis not present

## 2020-08-08 DIAGNOSIS — E46 Unspecified protein-calorie malnutrition: Secondary | ICD-10-CM | POA: Diagnosis not present

## 2020-08-09 DIAGNOSIS — E46 Unspecified protein-calorie malnutrition: Secondary | ICD-10-CM | POA: Diagnosis not present

## 2020-08-09 DIAGNOSIS — I10 Essential (primary) hypertension: Secondary | ICD-10-CM | POA: Diagnosis not present

## 2020-08-09 DIAGNOSIS — G35 Multiple sclerosis: Secondary | ICD-10-CM | POA: Diagnosis not present

## 2020-08-09 DIAGNOSIS — D649 Anemia, unspecified: Secondary | ICD-10-CM | POA: Diagnosis not present

## 2020-08-09 DIAGNOSIS — E785 Hyperlipidemia, unspecified: Secondary | ICD-10-CM | POA: Diagnosis not present

## 2020-08-09 DIAGNOSIS — J449 Chronic obstructive pulmonary disease, unspecified: Secondary | ICD-10-CM | POA: Diagnosis not present

## 2020-08-09 DIAGNOSIS — G47419 Narcolepsy without cataplexy: Secondary | ICD-10-CM | POA: Diagnosis not present

## 2020-08-09 DIAGNOSIS — Z8614 Personal history of Methicillin resistant Staphylococcus aureus infection: Secondary | ICD-10-CM | POA: Diagnosis not present

## 2020-08-09 DIAGNOSIS — Z681 Body mass index (BMI) 19 or less, adult: Secondary | ICD-10-CM | POA: Diagnosis not present

## 2020-08-09 DIAGNOSIS — R296 Repeated falls: Secondary | ICD-10-CM | POA: Diagnosis not present

## 2020-08-09 DIAGNOSIS — L89153 Pressure ulcer of sacral region, stage 3: Secondary | ICD-10-CM | POA: Diagnosis not present

## 2020-08-10 DIAGNOSIS — I10 Essential (primary) hypertension: Secondary | ICD-10-CM | POA: Diagnosis not present

## 2020-08-10 DIAGNOSIS — R296 Repeated falls: Secondary | ICD-10-CM | POA: Diagnosis not present

## 2020-08-10 DIAGNOSIS — Z8614 Personal history of Methicillin resistant Staphylococcus aureus infection: Secondary | ICD-10-CM | POA: Diagnosis not present

## 2020-08-10 DIAGNOSIS — D649 Anemia, unspecified: Secondary | ICD-10-CM | POA: Diagnosis not present

## 2020-08-10 DIAGNOSIS — G47419 Narcolepsy without cataplexy: Secondary | ICD-10-CM | POA: Diagnosis not present

## 2020-08-10 DIAGNOSIS — L89153 Pressure ulcer of sacral region, stage 3: Secondary | ICD-10-CM | POA: Diagnosis not present

## 2020-08-10 DIAGNOSIS — G35 Multiple sclerosis: Secondary | ICD-10-CM | POA: Diagnosis not present

## 2020-08-10 DIAGNOSIS — E785 Hyperlipidemia, unspecified: Secondary | ICD-10-CM | POA: Diagnosis not present

## 2020-08-10 DIAGNOSIS — Z681 Body mass index (BMI) 19 or less, adult: Secondary | ICD-10-CM | POA: Diagnosis not present

## 2020-08-10 DIAGNOSIS — E46 Unspecified protein-calorie malnutrition: Secondary | ICD-10-CM | POA: Diagnosis not present

## 2020-08-10 DIAGNOSIS — J449 Chronic obstructive pulmonary disease, unspecified: Secondary | ICD-10-CM | POA: Diagnosis not present

## 2020-08-11 DIAGNOSIS — I10 Essential (primary) hypertension: Secondary | ICD-10-CM | POA: Diagnosis not present

## 2020-08-11 DIAGNOSIS — E46 Unspecified protein-calorie malnutrition: Secondary | ICD-10-CM | POA: Diagnosis not present

## 2020-08-11 DIAGNOSIS — L89153 Pressure ulcer of sacral region, stage 3: Secondary | ICD-10-CM | POA: Diagnosis not present

## 2020-08-11 DIAGNOSIS — G35 Multiple sclerosis: Secondary | ICD-10-CM | POA: Diagnosis not present

## 2020-08-11 DIAGNOSIS — E785 Hyperlipidemia, unspecified: Secondary | ICD-10-CM | POA: Diagnosis not present

## 2020-08-11 DIAGNOSIS — G47419 Narcolepsy without cataplexy: Secondary | ICD-10-CM | POA: Diagnosis not present

## 2020-08-11 DIAGNOSIS — J449 Chronic obstructive pulmonary disease, unspecified: Secondary | ICD-10-CM | POA: Diagnosis not present

## 2020-08-11 DIAGNOSIS — Z681 Body mass index (BMI) 19 or less, adult: Secondary | ICD-10-CM | POA: Diagnosis not present

## 2020-08-11 DIAGNOSIS — R296 Repeated falls: Secondary | ICD-10-CM | POA: Diagnosis not present

## 2020-08-11 DIAGNOSIS — Z8614 Personal history of Methicillin resistant Staphylococcus aureus infection: Secondary | ICD-10-CM | POA: Diagnosis not present

## 2020-08-11 DIAGNOSIS — D649 Anemia, unspecified: Secondary | ICD-10-CM | POA: Diagnosis not present

## 2020-08-12 DIAGNOSIS — Z8614 Personal history of Methicillin resistant Staphylococcus aureus infection: Secondary | ICD-10-CM | POA: Diagnosis not present

## 2020-08-12 DIAGNOSIS — E46 Unspecified protein-calorie malnutrition: Secondary | ICD-10-CM | POA: Diagnosis not present

## 2020-08-12 DIAGNOSIS — Z681 Body mass index (BMI) 19 or less, adult: Secondary | ICD-10-CM | POA: Diagnosis not present

## 2020-08-12 DIAGNOSIS — I10 Essential (primary) hypertension: Secondary | ICD-10-CM | POA: Diagnosis not present

## 2020-08-12 DIAGNOSIS — J449 Chronic obstructive pulmonary disease, unspecified: Secondary | ICD-10-CM | POA: Diagnosis not present

## 2020-08-12 DIAGNOSIS — E785 Hyperlipidemia, unspecified: Secondary | ICD-10-CM | POA: Diagnosis not present

## 2020-08-12 DIAGNOSIS — G47419 Narcolepsy without cataplexy: Secondary | ICD-10-CM | POA: Diagnosis not present

## 2020-08-12 DIAGNOSIS — D649 Anemia, unspecified: Secondary | ICD-10-CM | POA: Diagnosis not present

## 2020-08-12 DIAGNOSIS — R296 Repeated falls: Secondary | ICD-10-CM | POA: Diagnosis not present

## 2020-08-12 DIAGNOSIS — G35 Multiple sclerosis: Secondary | ICD-10-CM | POA: Diagnosis not present

## 2020-08-12 DIAGNOSIS — L89153 Pressure ulcer of sacral region, stage 3: Secondary | ICD-10-CM | POA: Diagnosis not present

## 2020-08-13 DIAGNOSIS — D649 Anemia, unspecified: Secondary | ICD-10-CM | POA: Diagnosis not present

## 2020-08-13 DIAGNOSIS — G35 Multiple sclerosis: Secondary | ICD-10-CM | POA: Diagnosis not present

## 2020-08-13 DIAGNOSIS — Z681 Body mass index (BMI) 19 or less, adult: Secondary | ICD-10-CM | POA: Diagnosis not present

## 2020-08-13 DIAGNOSIS — Z8614 Personal history of Methicillin resistant Staphylococcus aureus infection: Secondary | ICD-10-CM | POA: Diagnosis not present

## 2020-08-13 DIAGNOSIS — E46 Unspecified protein-calorie malnutrition: Secondary | ICD-10-CM | POA: Diagnosis not present

## 2020-08-13 DIAGNOSIS — R296 Repeated falls: Secondary | ICD-10-CM | POA: Diagnosis not present

## 2020-08-13 DIAGNOSIS — L89153 Pressure ulcer of sacral region, stage 3: Secondary | ICD-10-CM | POA: Diagnosis not present

## 2020-08-13 DIAGNOSIS — I10 Essential (primary) hypertension: Secondary | ICD-10-CM | POA: Diagnosis not present

## 2020-08-13 DIAGNOSIS — J449 Chronic obstructive pulmonary disease, unspecified: Secondary | ICD-10-CM | POA: Diagnosis not present

## 2020-08-13 DIAGNOSIS — G47419 Narcolepsy without cataplexy: Secondary | ICD-10-CM | POA: Diagnosis not present

## 2020-08-13 DIAGNOSIS — E785 Hyperlipidemia, unspecified: Secondary | ICD-10-CM | POA: Diagnosis not present

## 2020-08-14 ENCOUNTER — Other Ambulatory Visit (INDEPENDENT_AMBULATORY_CARE_PROVIDER_SITE_OTHER): Payer: Self-pay | Admitting: Internal Medicine

## 2020-08-14 DIAGNOSIS — Z8614 Personal history of Methicillin resistant Staphylococcus aureus infection: Secondary | ICD-10-CM | POA: Diagnosis not present

## 2020-08-14 DIAGNOSIS — D649 Anemia, unspecified: Secondary | ICD-10-CM | POA: Diagnosis not present

## 2020-08-14 DIAGNOSIS — Z681 Body mass index (BMI) 19 or less, adult: Secondary | ICD-10-CM | POA: Diagnosis not present

## 2020-08-14 DIAGNOSIS — R296 Repeated falls: Secondary | ICD-10-CM | POA: Diagnosis not present

## 2020-08-14 DIAGNOSIS — L89153 Pressure ulcer of sacral region, stage 3: Secondary | ICD-10-CM | POA: Diagnosis not present

## 2020-08-14 DIAGNOSIS — G35 Multiple sclerosis: Secondary | ICD-10-CM | POA: Diagnosis not present

## 2020-08-14 DIAGNOSIS — E46 Unspecified protein-calorie malnutrition: Secondary | ICD-10-CM | POA: Diagnosis not present

## 2020-08-14 DIAGNOSIS — J449 Chronic obstructive pulmonary disease, unspecified: Secondary | ICD-10-CM | POA: Diagnosis not present

## 2020-08-14 DIAGNOSIS — I10 Essential (primary) hypertension: Secondary | ICD-10-CM | POA: Diagnosis not present

## 2020-08-14 DIAGNOSIS — E785 Hyperlipidemia, unspecified: Secondary | ICD-10-CM | POA: Diagnosis not present

## 2020-08-14 DIAGNOSIS — G47419 Narcolepsy without cataplexy: Secondary | ICD-10-CM | POA: Diagnosis not present

## 2020-08-15 DIAGNOSIS — G35 Multiple sclerosis: Secondary | ICD-10-CM | POA: Diagnosis not present

## 2020-08-15 DIAGNOSIS — G47419 Narcolepsy without cataplexy: Secondary | ICD-10-CM | POA: Diagnosis not present

## 2020-08-15 DIAGNOSIS — L89153 Pressure ulcer of sacral region, stage 3: Secondary | ICD-10-CM | POA: Diagnosis not present

## 2020-08-15 DIAGNOSIS — R296 Repeated falls: Secondary | ICD-10-CM | POA: Diagnosis not present

## 2020-08-15 DIAGNOSIS — Z681 Body mass index (BMI) 19 or less, adult: Secondary | ICD-10-CM | POA: Diagnosis not present

## 2020-08-15 DIAGNOSIS — D649 Anemia, unspecified: Secondary | ICD-10-CM | POA: Diagnosis not present

## 2020-08-15 DIAGNOSIS — J449 Chronic obstructive pulmonary disease, unspecified: Secondary | ICD-10-CM | POA: Diagnosis not present

## 2020-08-15 DIAGNOSIS — Z8614 Personal history of Methicillin resistant Staphylococcus aureus infection: Secondary | ICD-10-CM | POA: Diagnosis not present

## 2020-08-15 DIAGNOSIS — E785 Hyperlipidemia, unspecified: Secondary | ICD-10-CM | POA: Diagnosis not present

## 2020-08-15 DIAGNOSIS — E46 Unspecified protein-calorie malnutrition: Secondary | ICD-10-CM | POA: Diagnosis not present

## 2020-08-15 DIAGNOSIS — I10 Essential (primary) hypertension: Secondary | ICD-10-CM | POA: Diagnosis not present

## 2020-08-16 DIAGNOSIS — G35 Multiple sclerosis: Secondary | ICD-10-CM | POA: Diagnosis not present

## 2020-08-16 DIAGNOSIS — L89153 Pressure ulcer of sacral region, stage 3: Secondary | ICD-10-CM | POA: Diagnosis not present

## 2020-08-16 DIAGNOSIS — R296 Repeated falls: Secondary | ICD-10-CM | POA: Diagnosis not present

## 2020-08-16 DIAGNOSIS — E46 Unspecified protein-calorie malnutrition: Secondary | ICD-10-CM | POA: Diagnosis not present

## 2020-08-16 DIAGNOSIS — I10 Essential (primary) hypertension: Secondary | ICD-10-CM | POA: Diagnosis not present

## 2020-08-16 DIAGNOSIS — Z8614 Personal history of Methicillin resistant Staphylococcus aureus infection: Secondary | ICD-10-CM | POA: Diagnosis not present

## 2020-08-16 DIAGNOSIS — D649 Anemia, unspecified: Secondary | ICD-10-CM | POA: Diagnosis not present

## 2020-08-16 DIAGNOSIS — G47419 Narcolepsy without cataplexy: Secondary | ICD-10-CM | POA: Diagnosis not present

## 2020-08-16 DIAGNOSIS — E785 Hyperlipidemia, unspecified: Secondary | ICD-10-CM | POA: Diagnosis not present

## 2020-08-16 DIAGNOSIS — Z681 Body mass index (BMI) 19 or less, adult: Secondary | ICD-10-CM | POA: Diagnosis not present

## 2020-08-16 DIAGNOSIS — J449 Chronic obstructive pulmonary disease, unspecified: Secondary | ICD-10-CM | POA: Diagnosis not present

## 2020-08-17 DIAGNOSIS — G35 Multiple sclerosis: Secondary | ICD-10-CM | POA: Diagnosis not present

## 2020-08-17 DIAGNOSIS — Z681 Body mass index (BMI) 19 or less, adult: Secondary | ICD-10-CM | POA: Diagnosis not present

## 2020-08-17 DIAGNOSIS — Z8614 Personal history of Methicillin resistant Staphylococcus aureus infection: Secondary | ICD-10-CM | POA: Diagnosis not present

## 2020-08-17 DIAGNOSIS — J449 Chronic obstructive pulmonary disease, unspecified: Secondary | ICD-10-CM | POA: Diagnosis not present

## 2020-08-17 DIAGNOSIS — E46 Unspecified protein-calorie malnutrition: Secondary | ICD-10-CM | POA: Diagnosis not present

## 2020-08-17 DIAGNOSIS — L89153 Pressure ulcer of sacral region, stage 3: Secondary | ICD-10-CM | POA: Diagnosis not present

## 2020-08-17 DIAGNOSIS — I10 Essential (primary) hypertension: Secondary | ICD-10-CM | POA: Diagnosis not present

## 2020-08-17 DIAGNOSIS — G47419 Narcolepsy without cataplexy: Secondary | ICD-10-CM | POA: Diagnosis not present

## 2020-08-17 DIAGNOSIS — E785 Hyperlipidemia, unspecified: Secondary | ICD-10-CM | POA: Diagnosis not present

## 2020-08-17 DIAGNOSIS — R296 Repeated falls: Secondary | ICD-10-CM | POA: Diagnosis not present

## 2020-08-17 DIAGNOSIS — D649 Anemia, unspecified: Secondary | ICD-10-CM | POA: Diagnosis not present

## 2020-08-18 ENCOUNTER — Other Ambulatory Visit (INDEPENDENT_AMBULATORY_CARE_PROVIDER_SITE_OTHER): Payer: Self-pay | Admitting: Internal Medicine

## 2020-08-18 DIAGNOSIS — Z8614 Personal history of Methicillin resistant Staphylococcus aureus infection: Secondary | ICD-10-CM | POA: Diagnosis not present

## 2020-08-18 DIAGNOSIS — E46 Unspecified protein-calorie malnutrition: Secondary | ICD-10-CM | POA: Diagnosis not present

## 2020-08-18 DIAGNOSIS — G47419 Narcolepsy without cataplexy: Secondary | ICD-10-CM | POA: Diagnosis not present

## 2020-08-18 DIAGNOSIS — R296 Repeated falls: Secondary | ICD-10-CM | POA: Diagnosis not present

## 2020-08-18 DIAGNOSIS — E785 Hyperlipidemia, unspecified: Secondary | ICD-10-CM | POA: Diagnosis not present

## 2020-08-18 DIAGNOSIS — L89153 Pressure ulcer of sacral region, stage 3: Secondary | ICD-10-CM | POA: Diagnosis not present

## 2020-08-18 DIAGNOSIS — Z681 Body mass index (BMI) 19 or less, adult: Secondary | ICD-10-CM | POA: Diagnosis not present

## 2020-08-18 DIAGNOSIS — J449 Chronic obstructive pulmonary disease, unspecified: Secondary | ICD-10-CM | POA: Diagnosis not present

## 2020-08-18 DIAGNOSIS — D649 Anemia, unspecified: Secondary | ICD-10-CM | POA: Diagnosis not present

## 2020-08-18 DIAGNOSIS — G35 Multiple sclerosis: Secondary | ICD-10-CM | POA: Diagnosis not present

## 2020-08-18 DIAGNOSIS — I10 Essential (primary) hypertension: Secondary | ICD-10-CM | POA: Diagnosis not present

## 2020-08-19 DIAGNOSIS — I10 Essential (primary) hypertension: Secondary | ICD-10-CM | POA: Diagnosis not present

## 2020-08-19 DIAGNOSIS — E785 Hyperlipidemia, unspecified: Secondary | ICD-10-CM | POA: Diagnosis not present

## 2020-08-19 DIAGNOSIS — G35 Multiple sclerosis: Secondary | ICD-10-CM | POA: Diagnosis not present

## 2020-08-19 DIAGNOSIS — Z681 Body mass index (BMI) 19 or less, adult: Secondary | ICD-10-CM | POA: Diagnosis not present

## 2020-08-19 DIAGNOSIS — J449 Chronic obstructive pulmonary disease, unspecified: Secondary | ICD-10-CM | POA: Diagnosis not present

## 2020-08-19 DIAGNOSIS — L89153 Pressure ulcer of sacral region, stage 3: Secondary | ICD-10-CM | POA: Diagnosis not present

## 2020-08-19 DIAGNOSIS — G47419 Narcolepsy without cataplexy: Secondary | ICD-10-CM | POA: Diagnosis not present

## 2020-08-19 DIAGNOSIS — R296 Repeated falls: Secondary | ICD-10-CM | POA: Diagnosis not present

## 2020-08-19 DIAGNOSIS — Z8614 Personal history of Methicillin resistant Staphylococcus aureus infection: Secondary | ICD-10-CM | POA: Diagnosis not present

## 2020-08-19 DIAGNOSIS — E46 Unspecified protein-calorie malnutrition: Secondary | ICD-10-CM | POA: Diagnosis not present

## 2020-08-19 DIAGNOSIS — D649 Anemia, unspecified: Secondary | ICD-10-CM | POA: Diagnosis not present

## 2020-08-20 DIAGNOSIS — G35 Multiple sclerosis: Secondary | ICD-10-CM | POA: Diagnosis not present

## 2020-08-20 DIAGNOSIS — Z681 Body mass index (BMI) 19 or less, adult: Secondary | ICD-10-CM | POA: Diagnosis not present

## 2020-08-20 DIAGNOSIS — E785 Hyperlipidemia, unspecified: Secondary | ICD-10-CM | POA: Diagnosis not present

## 2020-08-20 DIAGNOSIS — D649 Anemia, unspecified: Secondary | ICD-10-CM | POA: Diagnosis not present

## 2020-08-20 DIAGNOSIS — Z8614 Personal history of Methicillin resistant Staphylococcus aureus infection: Secondary | ICD-10-CM | POA: Diagnosis not present

## 2020-08-20 DIAGNOSIS — L89153 Pressure ulcer of sacral region, stage 3: Secondary | ICD-10-CM | POA: Diagnosis not present

## 2020-08-20 DIAGNOSIS — R296 Repeated falls: Secondary | ICD-10-CM | POA: Diagnosis not present

## 2020-08-20 DIAGNOSIS — G47419 Narcolepsy without cataplexy: Secondary | ICD-10-CM | POA: Diagnosis not present

## 2020-08-20 DIAGNOSIS — E46 Unspecified protein-calorie malnutrition: Secondary | ICD-10-CM | POA: Diagnosis not present

## 2020-08-20 DIAGNOSIS — J449 Chronic obstructive pulmonary disease, unspecified: Secondary | ICD-10-CM | POA: Diagnosis not present

## 2020-08-20 DIAGNOSIS — I10 Essential (primary) hypertension: Secondary | ICD-10-CM | POA: Diagnosis not present

## 2020-08-21 DIAGNOSIS — G47419 Narcolepsy without cataplexy: Secondary | ICD-10-CM | POA: Diagnosis not present

## 2020-08-21 DIAGNOSIS — R296 Repeated falls: Secondary | ICD-10-CM | POA: Diagnosis not present

## 2020-08-21 DIAGNOSIS — E46 Unspecified protein-calorie malnutrition: Secondary | ICD-10-CM | POA: Diagnosis not present

## 2020-08-21 DIAGNOSIS — Z681 Body mass index (BMI) 19 or less, adult: Secondary | ICD-10-CM | POA: Diagnosis not present

## 2020-08-21 DIAGNOSIS — Z8614 Personal history of Methicillin resistant Staphylococcus aureus infection: Secondary | ICD-10-CM | POA: Diagnosis not present

## 2020-08-21 DIAGNOSIS — L89153 Pressure ulcer of sacral region, stage 3: Secondary | ICD-10-CM | POA: Diagnosis not present

## 2020-08-21 DIAGNOSIS — J449 Chronic obstructive pulmonary disease, unspecified: Secondary | ICD-10-CM | POA: Diagnosis not present

## 2020-08-21 DIAGNOSIS — I10 Essential (primary) hypertension: Secondary | ICD-10-CM | POA: Diagnosis not present

## 2020-08-21 DIAGNOSIS — G35 Multiple sclerosis: Secondary | ICD-10-CM | POA: Diagnosis not present

## 2020-08-21 DIAGNOSIS — D649 Anemia, unspecified: Secondary | ICD-10-CM | POA: Diagnosis not present

## 2020-08-21 DIAGNOSIS — E785 Hyperlipidemia, unspecified: Secondary | ICD-10-CM | POA: Diagnosis not present

## 2020-08-22 DIAGNOSIS — G35 Multiple sclerosis: Secondary | ICD-10-CM | POA: Diagnosis not present

## 2020-08-22 DIAGNOSIS — E46 Unspecified protein-calorie malnutrition: Secondary | ICD-10-CM | POA: Diagnosis not present

## 2020-08-22 DIAGNOSIS — J449 Chronic obstructive pulmonary disease, unspecified: Secondary | ICD-10-CM | POA: Diagnosis not present

## 2020-08-22 DIAGNOSIS — I10 Essential (primary) hypertension: Secondary | ICD-10-CM | POA: Diagnosis not present

## 2020-08-22 DIAGNOSIS — L89153 Pressure ulcer of sacral region, stage 3: Secondary | ICD-10-CM | POA: Diagnosis not present

## 2020-08-22 DIAGNOSIS — Z681 Body mass index (BMI) 19 or less, adult: Secondary | ICD-10-CM | POA: Diagnosis not present

## 2020-08-22 DIAGNOSIS — R296 Repeated falls: Secondary | ICD-10-CM | POA: Diagnosis not present

## 2020-08-22 DIAGNOSIS — Z8614 Personal history of Methicillin resistant Staphylococcus aureus infection: Secondary | ICD-10-CM | POA: Diagnosis not present

## 2020-08-22 DIAGNOSIS — G47419 Narcolepsy without cataplexy: Secondary | ICD-10-CM | POA: Diagnosis not present

## 2020-08-22 DIAGNOSIS — E785 Hyperlipidemia, unspecified: Secondary | ICD-10-CM | POA: Diagnosis not present

## 2020-08-22 DIAGNOSIS — D649 Anemia, unspecified: Secondary | ICD-10-CM | POA: Diagnosis not present

## 2020-08-23 DIAGNOSIS — L89153 Pressure ulcer of sacral region, stage 3: Secondary | ICD-10-CM | POA: Diagnosis not present

## 2020-08-23 DIAGNOSIS — R296 Repeated falls: Secondary | ICD-10-CM | POA: Diagnosis not present

## 2020-08-23 DIAGNOSIS — G47419 Narcolepsy without cataplexy: Secondary | ICD-10-CM | POA: Diagnosis not present

## 2020-08-23 DIAGNOSIS — Z681 Body mass index (BMI) 19 or less, adult: Secondary | ICD-10-CM | POA: Diagnosis not present

## 2020-08-23 DIAGNOSIS — E785 Hyperlipidemia, unspecified: Secondary | ICD-10-CM | POA: Diagnosis not present

## 2020-08-23 DIAGNOSIS — Z8614 Personal history of Methicillin resistant Staphylococcus aureus infection: Secondary | ICD-10-CM | POA: Diagnosis not present

## 2020-08-23 DIAGNOSIS — G35 Multiple sclerosis: Secondary | ICD-10-CM | POA: Diagnosis not present

## 2020-08-23 DIAGNOSIS — E46 Unspecified protein-calorie malnutrition: Secondary | ICD-10-CM | POA: Diagnosis not present

## 2020-08-23 DIAGNOSIS — D649 Anemia, unspecified: Secondary | ICD-10-CM | POA: Diagnosis not present

## 2020-08-23 DIAGNOSIS — I10 Essential (primary) hypertension: Secondary | ICD-10-CM | POA: Diagnosis not present

## 2020-08-23 DIAGNOSIS — J449 Chronic obstructive pulmonary disease, unspecified: Secondary | ICD-10-CM | POA: Diagnosis not present

## 2020-08-24 DIAGNOSIS — L89153 Pressure ulcer of sacral region, stage 3: Secondary | ICD-10-CM | POA: Diagnosis not present

## 2020-08-24 DIAGNOSIS — Z8614 Personal history of Methicillin resistant Staphylococcus aureus infection: Secondary | ICD-10-CM | POA: Diagnosis not present

## 2020-08-24 DIAGNOSIS — E46 Unspecified protein-calorie malnutrition: Secondary | ICD-10-CM | POA: Diagnosis not present

## 2020-08-24 DIAGNOSIS — G47419 Narcolepsy without cataplexy: Secondary | ICD-10-CM | POA: Diagnosis not present

## 2020-08-24 DIAGNOSIS — I10 Essential (primary) hypertension: Secondary | ICD-10-CM | POA: Diagnosis not present

## 2020-08-24 DIAGNOSIS — J449 Chronic obstructive pulmonary disease, unspecified: Secondary | ICD-10-CM | POA: Diagnosis not present

## 2020-08-24 DIAGNOSIS — D649 Anemia, unspecified: Secondary | ICD-10-CM | POA: Diagnosis not present

## 2020-08-24 DIAGNOSIS — G35 Multiple sclerosis: Secondary | ICD-10-CM | POA: Diagnosis not present

## 2020-08-24 DIAGNOSIS — E785 Hyperlipidemia, unspecified: Secondary | ICD-10-CM | POA: Diagnosis not present

## 2020-08-24 DIAGNOSIS — Z681 Body mass index (BMI) 19 or less, adult: Secondary | ICD-10-CM | POA: Diagnosis not present

## 2020-08-24 DIAGNOSIS — R296 Repeated falls: Secondary | ICD-10-CM | POA: Diagnosis not present

## 2020-08-25 DIAGNOSIS — J449 Chronic obstructive pulmonary disease, unspecified: Secondary | ICD-10-CM | POA: Diagnosis not present

## 2020-08-25 DIAGNOSIS — G35 Multiple sclerosis: Secondary | ICD-10-CM | POA: Diagnosis not present

## 2020-08-25 DIAGNOSIS — E46 Unspecified protein-calorie malnutrition: Secondary | ICD-10-CM | POA: Diagnosis not present

## 2020-08-25 DIAGNOSIS — L89153 Pressure ulcer of sacral region, stage 3: Secondary | ICD-10-CM | POA: Diagnosis not present

## 2020-08-25 DIAGNOSIS — R296 Repeated falls: Secondary | ICD-10-CM | POA: Diagnosis not present

## 2020-08-25 DIAGNOSIS — Z681 Body mass index (BMI) 19 or less, adult: Secondary | ICD-10-CM | POA: Diagnosis not present

## 2020-08-25 DIAGNOSIS — D649 Anemia, unspecified: Secondary | ICD-10-CM | POA: Diagnosis not present

## 2020-08-25 DIAGNOSIS — E785 Hyperlipidemia, unspecified: Secondary | ICD-10-CM | POA: Diagnosis not present

## 2020-08-25 DIAGNOSIS — Z8614 Personal history of Methicillin resistant Staphylococcus aureus infection: Secondary | ICD-10-CM | POA: Diagnosis not present

## 2020-08-25 DIAGNOSIS — I10 Essential (primary) hypertension: Secondary | ICD-10-CM | POA: Diagnosis not present

## 2020-08-25 DIAGNOSIS — G47419 Narcolepsy without cataplexy: Secondary | ICD-10-CM | POA: Diagnosis not present

## 2020-08-26 ENCOUNTER — Other Ambulatory Visit (INDEPENDENT_AMBULATORY_CARE_PROVIDER_SITE_OTHER): Payer: Self-pay | Admitting: Internal Medicine

## 2020-08-26 ENCOUNTER — Other Ambulatory Visit: Payer: Self-pay | Admitting: Physical Medicine and Rehabilitation

## 2020-08-26 DIAGNOSIS — G35 Multiple sclerosis: Secondary | ICD-10-CM | POA: Diagnosis not present

## 2020-08-26 DIAGNOSIS — E46 Unspecified protein-calorie malnutrition: Secondary | ICD-10-CM | POA: Diagnosis not present

## 2020-08-26 DIAGNOSIS — Z681 Body mass index (BMI) 19 or less, adult: Secondary | ICD-10-CM | POA: Diagnosis not present

## 2020-08-26 DIAGNOSIS — E785 Hyperlipidemia, unspecified: Secondary | ICD-10-CM | POA: Diagnosis not present

## 2020-08-26 DIAGNOSIS — G47419 Narcolepsy without cataplexy: Secondary | ICD-10-CM | POA: Diagnosis not present

## 2020-08-26 DIAGNOSIS — J449 Chronic obstructive pulmonary disease, unspecified: Secondary | ICD-10-CM | POA: Diagnosis not present

## 2020-08-26 DIAGNOSIS — R296 Repeated falls: Secondary | ICD-10-CM | POA: Diagnosis not present

## 2020-08-26 DIAGNOSIS — D649 Anemia, unspecified: Secondary | ICD-10-CM | POA: Diagnosis not present

## 2020-08-26 DIAGNOSIS — I10 Essential (primary) hypertension: Secondary | ICD-10-CM | POA: Diagnosis not present

## 2020-08-26 DIAGNOSIS — Z8614 Personal history of Methicillin resistant Staphylococcus aureus infection: Secondary | ICD-10-CM | POA: Diagnosis not present

## 2020-08-26 DIAGNOSIS — L89153 Pressure ulcer of sacral region, stage 3: Secondary | ICD-10-CM | POA: Diagnosis not present

## 2020-08-27 ENCOUNTER — Other Ambulatory Visit (INDEPENDENT_AMBULATORY_CARE_PROVIDER_SITE_OTHER): Payer: Self-pay | Admitting: Internal Medicine

## 2020-08-27 DIAGNOSIS — E785 Hyperlipidemia, unspecified: Secondary | ICD-10-CM | POA: Diagnosis not present

## 2020-08-27 DIAGNOSIS — E46 Unspecified protein-calorie malnutrition: Secondary | ICD-10-CM | POA: Diagnosis not present

## 2020-08-27 DIAGNOSIS — G35 Multiple sclerosis: Secondary | ICD-10-CM | POA: Diagnosis not present

## 2020-08-27 DIAGNOSIS — Z8614 Personal history of Methicillin resistant Staphylococcus aureus infection: Secondary | ICD-10-CM | POA: Diagnosis not present

## 2020-08-27 DIAGNOSIS — J449 Chronic obstructive pulmonary disease, unspecified: Secondary | ICD-10-CM | POA: Diagnosis not present

## 2020-08-27 DIAGNOSIS — L89153 Pressure ulcer of sacral region, stage 3: Secondary | ICD-10-CM | POA: Diagnosis not present

## 2020-08-27 DIAGNOSIS — I10 Essential (primary) hypertension: Secondary | ICD-10-CM | POA: Diagnosis not present

## 2020-08-27 DIAGNOSIS — G47419 Narcolepsy without cataplexy: Secondary | ICD-10-CM | POA: Diagnosis not present

## 2020-08-27 DIAGNOSIS — Z681 Body mass index (BMI) 19 or less, adult: Secondary | ICD-10-CM | POA: Diagnosis not present

## 2020-08-27 DIAGNOSIS — D649 Anemia, unspecified: Secondary | ICD-10-CM | POA: Diagnosis not present

## 2020-08-27 DIAGNOSIS — R296 Repeated falls: Secondary | ICD-10-CM | POA: Diagnosis not present

## 2020-08-28 DIAGNOSIS — L89153 Pressure ulcer of sacral region, stage 3: Secondary | ICD-10-CM | POA: Diagnosis not present

## 2020-08-28 DIAGNOSIS — G47419 Narcolepsy without cataplexy: Secondary | ICD-10-CM | POA: Diagnosis not present

## 2020-08-28 DIAGNOSIS — G35 Multiple sclerosis: Secondary | ICD-10-CM | POA: Diagnosis not present

## 2020-08-28 DIAGNOSIS — Z8614 Personal history of Methicillin resistant Staphylococcus aureus infection: Secondary | ICD-10-CM | POA: Diagnosis not present

## 2020-08-28 DIAGNOSIS — I10 Essential (primary) hypertension: Secondary | ICD-10-CM | POA: Diagnosis not present

## 2020-08-28 DIAGNOSIS — E46 Unspecified protein-calorie malnutrition: Secondary | ICD-10-CM | POA: Diagnosis not present

## 2020-08-28 DIAGNOSIS — Z681 Body mass index (BMI) 19 or less, adult: Secondary | ICD-10-CM | POA: Diagnosis not present

## 2020-08-28 DIAGNOSIS — D649 Anemia, unspecified: Secondary | ICD-10-CM | POA: Diagnosis not present

## 2020-08-28 DIAGNOSIS — J449 Chronic obstructive pulmonary disease, unspecified: Secondary | ICD-10-CM | POA: Diagnosis not present

## 2020-08-28 DIAGNOSIS — R296 Repeated falls: Secondary | ICD-10-CM | POA: Diagnosis not present

## 2020-08-28 DIAGNOSIS — E785 Hyperlipidemia, unspecified: Secondary | ICD-10-CM | POA: Diagnosis not present

## 2020-08-29 DIAGNOSIS — E46 Unspecified protein-calorie malnutrition: Secondary | ICD-10-CM | POA: Diagnosis not present

## 2020-08-29 DIAGNOSIS — D649 Anemia, unspecified: Secondary | ICD-10-CM | POA: Diagnosis not present

## 2020-08-29 DIAGNOSIS — E785 Hyperlipidemia, unspecified: Secondary | ICD-10-CM | POA: Diagnosis not present

## 2020-08-29 DIAGNOSIS — G35 Multiple sclerosis: Secondary | ICD-10-CM | POA: Diagnosis not present

## 2020-08-29 DIAGNOSIS — R296 Repeated falls: Secondary | ICD-10-CM | POA: Diagnosis not present

## 2020-08-29 DIAGNOSIS — L89153 Pressure ulcer of sacral region, stage 3: Secondary | ICD-10-CM | POA: Diagnosis not present

## 2020-08-29 DIAGNOSIS — Z681 Body mass index (BMI) 19 or less, adult: Secondary | ICD-10-CM | POA: Diagnosis not present

## 2020-08-29 DIAGNOSIS — J449 Chronic obstructive pulmonary disease, unspecified: Secondary | ICD-10-CM | POA: Diagnosis not present

## 2020-08-29 DIAGNOSIS — G47419 Narcolepsy without cataplexy: Secondary | ICD-10-CM | POA: Diagnosis not present

## 2020-08-29 DIAGNOSIS — Z8614 Personal history of Methicillin resistant Staphylococcus aureus infection: Secondary | ICD-10-CM | POA: Diagnosis not present

## 2020-08-29 DIAGNOSIS — I10 Essential (primary) hypertension: Secondary | ICD-10-CM | POA: Diagnosis not present

## 2020-08-30 DIAGNOSIS — R296 Repeated falls: Secondary | ICD-10-CM | POA: Diagnosis not present

## 2020-08-30 DIAGNOSIS — Z8614 Personal history of Methicillin resistant Staphylococcus aureus infection: Secondary | ICD-10-CM | POA: Diagnosis not present

## 2020-08-30 DIAGNOSIS — I10 Essential (primary) hypertension: Secondary | ICD-10-CM | POA: Diagnosis not present

## 2020-08-30 DIAGNOSIS — E46 Unspecified protein-calorie malnutrition: Secondary | ICD-10-CM | POA: Diagnosis not present

## 2020-08-30 DIAGNOSIS — D649 Anemia, unspecified: Secondary | ICD-10-CM | POA: Diagnosis not present

## 2020-08-30 DIAGNOSIS — J449 Chronic obstructive pulmonary disease, unspecified: Secondary | ICD-10-CM | POA: Diagnosis not present

## 2020-08-30 DIAGNOSIS — Z681 Body mass index (BMI) 19 or less, adult: Secondary | ICD-10-CM | POA: Diagnosis not present

## 2020-08-30 DIAGNOSIS — G35 Multiple sclerosis: Secondary | ICD-10-CM | POA: Diagnosis not present

## 2020-08-30 DIAGNOSIS — E785 Hyperlipidemia, unspecified: Secondary | ICD-10-CM | POA: Diagnosis not present

## 2020-08-30 DIAGNOSIS — L89153 Pressure ulcer of sacral region, stage 3: Secondary | ICD-10-CM | POA: Diagnosis not present

## 2020-08-30 DIAGNOSIS — G47419 Narcolepsy without cataplexy: Secondary | ICD-10-CM | POA: Diagnosis not present

## 2020-08-31 DIAGNOSIS — E785 Hyperlipidemia, unspecified: Secondary | ICD-10-CM | POA: Diagnosis not present

## 2020-08-31 DIAGNOSIS — I10 Essential (primary) hypertension: Secondary | ICD-10-CM | POA: Diagnosis not present

## 2020-08-31 DIAGNOSIS — Z8614 Personal history of Methicillin resistant Staphylococcus aureus infection: Secondary | ICD-10-CM | POA: Diagnosis not present

## 2020-08-31 DIAGNOSIS — Z681 Body mass index (BMI) 19 or less, adult: Secondary | ICD-10-CM | POA: Diagnosis not present

## 2020-08-31 DIAGNOSIS — D649 Anemia, unspecified: Secondary | ICD-10-CM | POA: Diagnosis not present

## 2020-08-31 DIAGNOSIS — G47419 Narcolepsy without cataplexy: Secondary | ICD-10-CM | POA: Diagnosis not present

## 2020-08-31 DIAGNOSIS — E46 Unspecified protein-calorie malnutrition: Secondary | ICD-10-CM | POA: Diagnosis not present

## 2020-08-31 DIAGNOSIS — L89153 Pressure ulcer of sacral region, stage 3: Secondary | ICD-10-CM | POA: Diagnosis not present

## 2020-08-31 DIAGNOSIS — G35 Multiple sclerosis: Secondary | ICD-10-CM | POA: Diagnosis not present

## 2020-08-31 DIAGNOSIS — J449 Chronic obstructive pulmonary disease, unspecified: Secondary | ICD-10-CM | POA: Diagnosis not present

## 2020-08-31 DIAGNOSIS — R296 Repeated falls: Secondary | ICD-10-CM | POA: Diagnosis not present

## 2020-09-01 DIAGNOSIS — G35 Multiple sclerosis: Secondary | ICD-10-CM | POA: Diagnosis not present

## 2020-09-01 DIAGNOSIS — D649 Anemia, unspecified: Secondary | ICD-10-CM | POA: Diagnosis not present

## 2020-09-01 DIAGNOSIS — E785 Hyperlipidemia, unspecified: Secondary | ICD-10-CM | POA: Diagnosis not present

## 2020-09-01 DIAGNOSIS — Z8614 Personal history of Methicillin resistant Staphylococcus aureus infection: Secondary | ICD-10-CM | POA: Diagnosis not present

## 2020-09-01 DIAGNOSIS — E46 Unspecified protein-calorie malnutrition: Secondary | ICD-10-CM | POA: Diagnosis not present

## 2020-09-01 DIAGNOSIS — G47419 Narcolepsy without cataplexy: Secondary | ICD-10-CM | POA: Diagnosis not present

## 2020-09-01 DIAGNOSIS — I10 Essential (primary) hypertension: Secondary | ICD-10-CM | POA: Diagnosis not present

## 2020-09-01 DIAGNOSIS — J449 Chronic obstructive pulmonary disease, unspecified: Secondary | ICD-10-CM | POA: Diagnosis not present

## 2020-09-01 DIAGNOSIS — R296 Repeated falls: Secondary | ICD-10-CM | POA: Diagnosis not present

## 2020-09-01 DIAGNOSIS — L89153 Pressure ulcer of sacral region, stage 3: Secondary | ICD-10-CM | POA: Diagnosis not present

## 2020-09-01 DIAGNOSIS — Z681 Body mass index (BMI) 19 or less, adult: Secondary | ICD-10-CM | POA: Diagnosis not present

## 2020-09-02 DIAGNOSIS — D649 Anemia, unspecified: Secondary | ICD-10-CM | POA: Diagnosis not present

## 2020-09-02 DIAGNOSIS — E46 Unspecified protein-calorie malnutrition: Secondary | ICD-10-CM | POA: Diagnosis not present

## 2020-09-02 DIAGNOSIS — Z681 Body mass index (BMI) 19 or less, adult: Secondary | ICD-10-CM | POA: Diagnosis not present

## 2020-09-02 DIAGNOSIS — Z8614 Personal history of Methicillin resistant Staphylococcus aureus infection: Secondary | ICD-10-CM | POA: Diagnosis not present

## 2020-09-02 DIAGNOSIS — E785 Hyperlipidemia, unspecified: Secondary | ICD-10-CM | POA: Diagnosis not present

## 2020-09-02 DIAGNOSIS — R296 Repeated falls: Secondary | ICD-10-CM | POA: Diagnosis not present

## 2020-09-02 DIAGNOSIS — J449 Chronic obstructive pulmonary disease, unspecified: Secondary | ICD-10-CM | POA: Diagnosis not present

## 2020-09-02 DIAGNOSIS — I10 Essential (primary) hypertension: Secondary | ICD-10-CM | POA: Diagnosis not present

## 2020-09-02 DIAGNOSIS — G35 Multiple sclerosis: Secondary | ICD-10-CM | POA: Diagnosis not present

## 2020-09-02 DIAGNOSIS — G47419 Narcolepsy without cataplexy: Secondary | ICD-10-CM | POA: Diagnosis not present

## 2020-09-02 DIAGNOSIS — L89153 Pressure ulcer of sacral region, stage 3: Secondary | ICD-10-CM | POA: Diagnosis not present

## 2020-09-03 DIAGNOSIS — E46 Unspecified protein-calorie malnutrition: Secondary | ICD-10-CM | POA: Diagnosis not present

## 2020-09-03 DIAGNOSIS — R296 Repeated falls: Secondary | ICD-10-CM | POA: Diagnosis not present

## 2020-09-03 DIAGNOSIS — Z681 Body mass index (BMI) 19 or less, adult: Secondary | ICD-10-CM | POA: Diagnosis not present

## 2020-09-03 DIAGNOSIS — E785 Hyperlipidemia, unspecified: Secondary | ICD-10-CM | POA: Diagnosis not present

## 2020-09-03 DIAGNOSIS — D649 Anemia, unspecified: Secondary | ICD-10-CM | POA: Diagnosis not present

## 2020-09-03 DIAGNOSIS — Z8614 Personal history of Methicillin resistant Staphylococcus aureus infection: Secondary | ICD-10-CM | POA: Diagnosis not present

## 2020-09-03 DIAGNOSIS — J449 Chronic obstructive pulmonary disease, unspecified: Secondary | ICD-10-CM | POA: Diagnosis not present

## 2020-09-03 DIAGNOSIS — G47419 Narcolepsy without cataplexy: Secondary | ICD-10-CM | POA: Diagnosis not present

## 2020-09-03 DIAGNOSIS — L89153 Pressure ulcer of sacral region, stage 3: Secondary | ICD-10-CM | POA: Diagnosis not present

## 2020-09-03 DIAGNOSIS — G35 Multiple sclerosis: Secondary | ICD-10-CM | POA: Diagnosis not present

## 2020-09-03 DIAGNOSIS — I10 Essential (primary) hypertension: Secondary | ICD-10-CM | POA: Diagnosis not present

## 2020-09-04 DIAGNOSIS — J449 Chronic obstructive pulmonary disease, unspecified: Secondary | ICD-10-CM | POA: Diagnosis not present

## 2020-09-04 DIAGNOSIS — G35 Multiple sclerosis: Secondary | ICD-10-CM | POA: Diagnosis not present

## 2020-09-04 DIAGNOSIS — D649 Anemia, unspecified: Secondary | ICD-10-CM | POA: Diagnosis not present

## 2020-09-04 DIAGNOSIS — G47419 Narcolepsy without cataplexy: Secondary | ICD-10-CM | POA: Diagnosis not present

## 2020-09-04 DIAGNOSIS — E46 Unspecified protein-calorie malnutrition: Secondary | ICD-10-CM | POA: Diagnosis not present

## 2020-09-04 DIAGNOSIS — E785 Hyperlipidemia, unspecified: Secondary | ICD-10-CM | POA: Diagnosis not present

## 2020-09-04 DIAGNOSIS — L89153 Pressure ulcer of sacral region, stage 3: Secondary | ICD-10-CM | POA: Diagnosis not present

## 2020-09-04 DIAGNOSIS — Z8614 Personal history of Methicillin resistant Staphylococcus aureus infection: Secondary | ICD-10-CM | POA: Diagnosis not present

## 2020-09-04 DIAGNOSIS — Z681 Body mass index (BMI) 19 or less, adult: Secondary | ICD-10-CM | POA: Diagnosis not present

## 2020-09-04 DIAGNOSIS — I10 Essential (primary) hypertension: Secondary | ICD-10-CM | POA: Diagnosis not present

## 2020-09-04 DIAGNOSIS — R296 Repeated falls: Secondary | ICD-10-CM | POA: Diagnosis not present

## 2020-09-05 DIAGNOSIS — E46 Unspecified protein-calorie malnutrition: Secondary | ICD-10-CM | POA: Diagnosis not present

## 2020-09-05 DIAGNOSIS — R296 Repeated falls: Secondary | ICD-10-CM | POA: Diagnosis not present

## 2020-09-05 DIAGNOSIS — I10 Essential (primary) hypertension: Secondary | ICD-10-CM | POA: Diagnosis not present

## 2020-09-05 DIAGNOSIS — E785 Hyperlipidemia, unspecified: Secondary | ICD-10-CM | POA: Diagnosis not present

## 2020-09-05 DIAGNOSIS — Z681 Body mass index (BMI) 19 or less, adult: Secondary | ICD-10-CM | POA: Diagnosis not present

## 2020-09-05 DIAGNOSIS — G35 Multiple sclerosis: Secondary | ICD-10-CM | POA: Diagnosis not present

## 2020-09-05 DIAGNOSIS — L89153 Pressure ulcer of sacral region, stage 3: Secondary | ICD-10-CM | POA: Diagnosis not present

## 2020-09-05 DIAGNOSIS — J449 Chronic obstructive pulmonary disease, unspecified: Secondary | ICD-10-CM | POA: Diagnosis not present

## 2020-09-05 DIAGNOSIS — Z8614 Personal history of Methicillin resistant Staphylococcus aureus infection: Secondary | ICD-10-CM | POA: Diagnosis not present

## 2020-09-05 DIAGNOSIS — D649 Anemia, unspecified: Secondary | ICD-10-CM | POA: Diagnosis not present

## 2020-09-05 DIAGNOSIS — G47419 Narcolepsy without cataplexy: Secondary | ICD-10-CM | POA: Diagnosis not present

## 2020-09-06 DIAGNOSIS — G47419 Narcolepsy without cataplexy: Secondary | ICD-10-CM | POA: Diagnosis not present

## 2020-09-06 DIAGNOSIS — Z681 Body mass index (BMI) 19 or less, adult: Secondary | ICD-10-CM | POA: Diagnosis not present

## 2020-09-06 DIAGNOSIS — D649 Anemia, unspecified: Secondary | ICD-10-CM | POA: Diagnosis not present

## 2020-09-06 DIAGNOSIS — G35 Multiple sclerosis: Secondary | ICD-10-CM | POA: Diagnosis not present

## 2020-09-06 DIAGNOSIS — Z8614 Personal history of Methicillin resistant Staphylococcus aureus infection: Secondary | ICD-10-CM | POA: Diagnosis not present

## 2020-09-06 DIAGNOSIS — J449 Chronic obstructive pulmonary disease, unspecified: Secondary | ICD-10-CM | POA: Diagnosis not present

## 2020-09-06 DIAGNOSIS — E46 Unspecified protein-calorie malnutrition: Secondary | ICD-10-CM | POA: Diagnosis not present

## 2020-09-06 DIAGNOSIS — I10 Essential (primary) hypertension: Secondary | ICD-10-CM | POA: Diagnosis not present

## 2020-09-06 DIAGNOSIS — R296 Repeated falls: Secondary | ICD-10-CM | POA: Diagnosis not present

## 2020-09-06 DIAGNOSIS — E785 Hyperlipidemia, unspecified: Secondary | ICD-10-CM | POA: Diagnosis not present

## 2020-09-06 DIAGNOSIS — L89153 Pressure ulcer of sacral region, stage 3: Secondary | ICD-10-CM | POA: Diagnosis not present

## 2020-09-07 DIAGNOSIS — E46 Unspecified protein-calorie malnutrition: Secondary | ICD-10-CM | POA: Diagnosis not present

## 2020-09-07 DIAGNOSIS — I10 Essential (primary) hypertension: Secondary | ICD-10-CM | POA: Diagnosis not present

## 2020-09-07 DIAGNOSIS — L89153 Pressure ulcer of sacral region, stage 3: Secondary | ICD-10-CM | POA: Diagnosis not present

## 2020-09-07 DIAGNOSIS — R296 Repeated falls: Secondary | ICD-10-CM | POA: Diagnosis not present

## 2020-09-07 DIAGNOSIS — E785 Hyperlipidemia, unspecified: Secondary | ICD-10-CM | POA: Diagnosis not present

## 2020-09-07 DIAGNOSIS — Z681 Body mass index (BMI) 19 or less, adult: Secondary | ICD-10-CM | POA: Diagnosis not present

## 2020-09-07 DIAGNOSIS — Z8614 Personal history of Methicillin resistant Staphylococcus aureus infection: Secondary | ICD-10-CM | POA: Diagnosis not present

## 2020-09-07 DIAGNOSIS — G35 Multiple sclerosis: Secondary | ICD-10-CM | POA: Diagnosis not present

## 2020-09-07 DIAGNOSIS — G47419 Narcolepsy without cataplexy: Secondary | ICD-10-CM | POA: Diagnosis not present

## 2020-09-07 DIAGNOSIS — J449 Chronic obstructive pulmonary disease, unspecified: Secondary | ICD-10-CM | POA: Diagnosis not present

## 2020-09-07 DIAGNOSIS — D649 Anemia, unspecified: Secondary | ICD-10-CM | POA: Diagnosis not present

## 2020-09-08 DIAGNOSIS — L89153 Pressure ulcer of sacral region, stage 3: Secondary | ICD-10-CM | POA: Diagnosis not present

## 2020-09-08 DIAGNOSIS — G35 Multiple sclerosis: Secondary | ICD-10-CM | POA: Diagnosis not present

## 2020-09-08 DIAGNOSIS — R296 Repeated falls: Secondary | ICD-10-CM | POA: Diagnosis not present

## 2020-09-08 DIAGNOSIS — Z8614 Personal history of Methicillin resistant Staphylococcus aureus infection: Secondary | ICD-10-CM | POA: Diagnosis not present

## 2020-09-08 DIAGNOSIS — E46 Unspecified protein-calorie malnutrition: Secondary | ICD-10-CM | POA: Diagnosis not present

## 2020-09-08 DIAGNOSIS — G47419 Narcolepsy without cataplexy: Secondary | ICD-10-CM | POA: Diagnosis not present

## 2020-09-08 DIAGNOSIS — J449 Chronic obstructive pulmonary disease, unspecified: Secondary | ICD-10-CM | POA: Diagnosis not present

## 2020-09-08 DIAGNOSIS — I10 Essential (primary) hypertension: Secondary | ICD-10-CM | POA: Diagnosis not present

## 2020-09-08 DIAGNOSIS — D649 Anemia, unspecified: Secondary | ICD-10-CM | POA: Diagnosis not present

## 2020-09-08 DIAGNOSIS — E785 Hyperlipidemia, unspecified: Secondary | ICD-10-CM | POA: Diagnosis not present

## 2020-09-08 DIAGNOSIS — Z681 Body mass index (BMI) 19 or less, adult: Secondary | ICD-10-CM | POA: Diagnosis not present

## 2020-09-09 DIAGNOSIS — Z8614 Personal history of Methicillin resistant Staphylococcus aureus infection: Secondary | ICD-10-CM | POA: Diagnosis not present

## 2020-09-09 DIAGNOSIS — I10 Essential (primary) hypertension: Secondary | ICD-10-CM | POA: Diagnosis not present

## 2020-09-09 DIAGNOSIS — Z681 Body mass index (BMI) 19 or less, adult: Secondary | ICD-10-CM | POA: Diagnosis not present

## 2020-09-09 DIAGNOSIS — G47419 Narcolepsy without cataplexy: Secondary | ICD-10-CM | POA: Diagnosis not present

## 2020-09-09 DIAGNOSIS — R296 Repeated falls: Secondary | ICD-10-CM | POA: Diagnosis not present

## 2020-09-09 DIAGNOSIS — D649 Anemia, unspecified: Secondary | ICD-10-CM | POA: Diagnosis not present

## 2020-09-09 DIAGNOSIS — J449 Chronic obstructive pulmonary disease, unspecified: Secondary | ICD-10-CM | POA: Diagnosis not present

## 2020-09-09 DIAGNOSIS — E785 Hyperlipidemia, unspecified: Secondary | ICD-10-CM | POA: Diagnosis not present

## 2020-09-09 DIAGNOSIS — E46 Unspecified protein-calorie malnutrition: Secondary | ICD-10-CM | POA: Diagnosis not present

## 2020-09-09 DIAGNOSIS — L89153 Pressure ulcer of sacral region, stage 3: Secondary | ICD-10-CM | POA: Diagnosis not present

## 2020-09-09 DIAGNOSIS — G35 Multiple sclerosis: Secondary | ICD-10-CM | POA: Diagnosis not present

## 2020-09-10 DIAGNOSIS — D649 Anemia, unspecified: Secondary | ICD-10-CM | POA: Diagnosis not present

## 2020-09-10 DIAGNOSIS — L89153 Pressure ulcer of sacral region, stage 3: Secondary | ICD-10-CM | POA: Diagnosis not present

## 2020-09-10 DIAGNOSIS — Z681 Body mass index (BMI) 19 or less, adult: Secondary | ICD-10-CM | POA: Diagnosis not present

## 2020-09-10 DIAGNOSIS — I10 Essential (primary) hypertension: Secondary | ICD-10-CM | POA: Diagnosis not present

## 2020-09-10 DIAGNOSIS — G35 Multiple sclerosis: Secondary | ICD-10-CM | POA: Diagnosis not present

## 2020-09-10 DIAGNOSIS — J449 Chronic obstructive pulmonary disease, unspecified: Secondary | ICD-10-CM | POA: Diagnosis not present

## 2020-09-10 DIAGNOSIS — Z8614 Personal history of Methicillin resistant Staphylococcus aureus infection: Secondary | ICD-10-CM | POA: Diagnosis not present

## 2020-09-10 DIAGNOSIS — E46 Unspecified protein-calorie malnutrition: Secondary | ICD-10-CM | POA: Diagnosis not present

## 2020-09-10 DIAGNOSIS — G47419 Narcolepsy without cataplexy: Secondary | ICD-10-CM | POA: Diagnosis not present

## 2020-09-10 DIAGNOSIS — E785 Hyperlipidemia, unspecified: Secondary | ICD-10-CM | POA: Diagnosis not present

## 2020-09-10 DIAGNOSIS — R296 Repeated falls: Secondary | ICD-10-CM | POA: Diagnosis not present

## 2020-09-11 ENCOUNTER — Other Ambulatory Visit (INDEPENDENT_AMBULATORY_CARE_PROVIDER_SITE_OTHER): Payer: Self-pay | Admitting: Internal Medicine

## 2020-09-11 DIAGNOSIS — E785 Hyperlipidemia, unspecified: Secondary | ICD-10-CM | POA: Diagnosis not present

## 2020-09-11 DIAGNOSIS — E46 Unspecified protein-calorie malnutrition: Secondary | ICD-10-CM | POA: Diagnosis not present

## 2020-09-11 DIAGNOSIS — J449 Chronic obstructive pulmonary disease, unspecified: Secondary | ICD-10-CM | POA: Diagnosis not present

## 2020-09-11 DIAGNOSIS — Z681 Body mass index (BMI) 19 or less, adult: Secondary | ICD-10-CM | POA: Diagnosis not present

## 2020-09-11 DIAGNOSIS — G35 Multiple sclerosis: Secondary | ICD-10-CM | POA: Diagnosis not present

## 2020-09-11 DIAGNOSIS — Z8614 Personal history of Methicillin resistant Staphylococcus aureus infection: Secondary | ICD-10-CM | POA: Diagnosis not present

## 2020-09-11 DIAGNOSIS — R296 Repeated falls: Secondary | ICD-10-CM | POA: Diagnosis not present

## 2020-09-11 DIAGNOSIS — L89153 Pressure ulcer of sacral region, stage 3: Secondary | ICD-10-CM | POA: Diagnosis not present

## 2020-09-11 DIAGNOSIS — D649 Anemia, unspecified: Secondary | ICD-10-CM | POA: Diagnosis not present

## 2020-09-11 DIAGNOSIS — I10 Essential (primary) hypertension: Secondary | ICD-10-CM | POA: Diagnosis not present

## 2020-09-11 DIAGNOSIS — G47419 Narcolepsy without cataplexy: Secondary | ICD-10-CM | POA: Diagnosis not present

## 2020-09-12 DIAGNOSIS — L89153 Pressure ulcer of sacral region, stage 3: Secondary | ICD-10-CM | POA: Diagnosis not present

## 2020-09-12 DIAGNOSIS — E785 Hyperlipidemia, unspecified: Secondary | ICD-10-CM | POA: Diagnosis not present

## 2020-09-12 DIAGNOSIS — I10 Essential (primary) hypertension: Secondary | ICD-10-CM | POA: Diagnosis not present

## 2020-09-12 DIAGNOSIS — G47419 Narcolepsy without cataplexy: Secondary | ICD-10-CM | POA: Diagnosis not present

## 2020-09-12 DIAGNOSIS — J449 Chronic obstructive pulmonary disease, unspecified: Secondary | ICD-10-CM | POA: Diagnosis not present

## 2020-09-12 DIAGNOSIS — G35 Multiple sclerosis: Secondary | ICD-10-CM | POA: Diagnosis not present

## 2020-09-12 DIAGNOSIS — R296 Repeated falls: Secondary | ICD-10-CM | POA: Diagnosis not present

## 2020-09-12 DIAGNOSIS — Z8614 Personal history of Methicillin resistant Staphylococcus aureus infection: Secondary | ICD-10-CM | POA: Diagnosis not present

## 2020-09-12 DIAGNOSIS — D649 Anemia, unspecified: Secondary | ICD-10-CM | POA: Diagnosis not present

## 2020-09-12 DIAGNOSIS — Z681 Body mass index (BMI) 19 or less, adult: Secondary | ICD-10-CM | POA: Diagnosis not present

## 2020-09-12 DIAGNOSIS — E46 Unspecified protein-calorie malnutrition: Secondary | ICD-10-CM | POA: Diagnosis not present

## 2020-09-13 DIAGNOSIS — E785 Hyperlipidemia, unspecified: Secondary | ICD-10-CM | POA: Diagnosis not present

## 2020-09-13 DIAGNOSIS — R296 Repeated falls: Secondary | ICD-10-CM | POA: Diagnosis not present

## 2020-09-13 DIAGNOSIS — L89153 Pressure ulcer of sacral region, stage 3: Secondary | ICD-10-CM | POA: Diagnosis not present

## 2020-09-13 DIAGNOSIS — I10 Essential (primary) hypertension: Secondary | ICD-10-CM | POA: Diagnosis not present

## 2020-09-13 DIAGNOSIS — G47419 Narcolepsy without cataplexy: Secondary | ICD-10-CM | POA: Diagnosis not present

## 2020-09-13 DIAGNOSIS — J449 Chronic obstructive pulmonary disease, unspecified: Secondary | ICD-10-CM | POA: Diagnosis not present

## 2020-09-13 DIAGNOSIS — G35 Multiple sclerosis: Secondary | ICD-10-CM | POA: Diagnosis not present

## 2020-09-13 DIAGNOSIS — Z681 Body mass index (BMI) 19 or less, adult: Secondary | ICD-10-CM | POA: Diagnosis not present

## 2020-09-13 DIAGNOSIS — D649 Anemia, unspecified: Secondary | ICD-10-CM | POA: Diagnosis not present

## 2020-09-13 DIAGNOSIS — E46 Unspecified protein-calorie malnutrition: Secondary | ICD-10-CM | POA: Diagnosis not present

## 2020-09-13 DIAGNOSIS — Z8614 Personal history of Methicillin resistant Staphylococcus aureus infection: Secondary | ICD-10-CM | POA: Diagnosis not present

## 2020-09-14 DIAGNOSIS — G47419 Narcolepsy without cataplexy: Secondary | ICD-10-CM | POA: Diagnosis not present

## 2020-09-14 DIAGNOSIS — E46 Unspecified protein-calorie malnutrition: Secondary | ICD-10-CM | POA: Diagnosis not present

## 2020-09-14 DIAGNOSIS — L89153 Pressure ulcer of sacral region, stage 3: Secondary | ICD-10-CM | POA: Diagnosis not present

## 2020-09-14 DIAGNOSIS — Z8614 Personal history of Methicillin resistant Staphylococcus aureus infection: Secondary | ICD-10-CM | POA: Diagnosis not present

## 2020-09-14 DIAGNOSIS — E785 Hyperlipidemia, unspecified: Secondary | ICD-10-CM | POA: Diagnosis not present

## 2020-09-14 DIAGNOSIS — G35 Multiple sclerosis: Secondary | ICD-10-CM | POA: Diagnosis not present

## 2020-09-14 DIAGNOSIS — Z681 Body mass index (BMI) 19 or less, adult: Secondary | ICD-10-CM | POA: Diagnosis not present

## 2020-09-14 DIAGNOSIS — J449 Chronic obstructive pulmonary disease, unspecified: Secondary | ICD-10-CM | POA: Diagnosis not present

## 2020-09-14 DIAGNOSIS — R296 Repeated falls: Secondary | ICD-10-CM | POA: Diagnosis not present

## 2020-09-14 DIAGNOSIS — I10 Essential (primary) hypertension: Secondary | ICD-10-CM | POA: Diagnosis not present

## 2020-09-14 DIAGNOSIS — D649 Anemia, unspecified: Secondary | ICD-10-CM | POA: Diagnosis not present

## 2020-09-15 DIAGNOSIS — J449 Chronic obstructive pulmonary disease, unspecified: Secondary | ICD-10-CM | POA: Diagnosis not present

## 2020-09-15 DIAGNOSIS — R296 Repeated falls: Secondary | ICD-10-CM | POA: Diagnosis not present

## 2020-09-15 DIAGNOSIS — D649 Anemia, unspecified: Secondary | ICD-10-CM | POA: Diagnosis not present

## 2020-09-15 DIAGNOSIS — E785 Hyperlipidemia, unspecified: Secondary | ICD-10-CM | POA: Diagnosis not present

## 2020-09-15 DIAGNOSIS — G47419 Narcolepsy without cataplexy: Secondary | ICD-10-CM | POA: Diagnosis not present

## 2020-09-15 DIAGNOSIS — G35 Multiple sclerosis: Secondary | ICD-10-CM | POA: Diagnosis not present

## 2020-09-15 DIAGNOSIS — Z8614 Personal history of Methicillin resistant Staphylococcus aureus infection: Secondary | ICD-10-CM | POA: Diagnosis not present

## 2020-09-15 DIAGNOSIS — Z681 Body mass index (BMI) 19 or less, adult: Secondary | ICD-10-CM | POA: Diagnosis not present

## 2020-09-15 DIAGNOSIS — I10 Essential (primary) hypertension: Secondary | ICD-10-CM | POA: Diagnosis not present

## 2020-09-15 DIAGNOSIS — L89153 Pressure ulcer of sacral region, stage 3: Secondary | ICD-10-CM | POA: Diagnosis not present

## 2020-09-15 DIAGNOSIS — E46 Unspecified protein-calorie malnutrition: Secondary | ICD-10-CM | POA: Diagnosis not present

## 2020-09-16 DIAGNOSIS — R296 Repeated falls: Secondary | ICD-10-CM | POA: Diagnosis not present

## 2020-09-16 DIAGNOSIS — E785 Hyperlipidemia, unspecified: Secondary | ICD-10-CM | POA: Diagnosis not present

## 2020-09-16 DIAGNOSIS — Z8614 Personal history of Methicillin resistant Staphylococcus aureus infection: Secondary | ICD-10-CM | POA: Diagnosis not present

## 2020-09-16 DIAGNOSIS — J449 Chronic obstructive pulmonary disease, unspecified: Secondary | ICD-10-CM | POA: Diagnosis not present

## 2020-09-16 DIAGNOSIS — L89153 Pressure ulcer of sacral region, stage 3: Secondary | ICD-10-CM | POA: Diagnosis not present

## 2020-09-16 DIAGNOSIS — E46 Unspecified protein-calorie malnutrition: Secondary | ICD-10-CM | POA: Diagnosis not present

## 2020-09-16 DIAGNOSIS — G35 Multiple sclerosis: Secondary | ICD-10-CM | POA: Diagnosis not present

## 2020-09-16 DIAGNOSIS — Z681 Body mass index (BMI) 19 or less, adult: Secondary | ICD-10-CM | POA: Diagnosis not present

## 2020-09-16 DIAGNOSIS — D649 Anemia, unspecified: Secondary | ICD-10-CM | POA: Diagnosis not present

## 2020-09-16 DIAGNOSIS — I10 Essential (primary) hypertension: Secondary | ICD-10-CM | POA: Diagnosis not present

## 2020-09-16 DIAGNOSIS — G47419 Narcolepsy without cataplexy: Secondary | ICD-10-CM | POA: Diagnosis not present

## 2020-09-17 DIAGNOSIS — Z8614 Personal history of Methicillin resistant Staphylococcus aureus infection: Secondary | ICD-10-CM | POA: Diagnosis not present

## 2020-09-17 DIAGNOSIS — I10 Essential (primary) hypertension: Secondary | ICD-10-CM | POA: Diagnosis not present

## 2020-09-17 DIAGNOSIS — L89153 Pressure ulcer of sacral region, stage 3: Secondary | ICD-10-CM | POA: Diagnosis not present

## 2020-09-17 DIAGNOSIS — G35 Multiple sclerosis: Secondary | ICD-10-CM | POA: Diagnosis not present

## 2020-09-17 DIAGNOSIS — G47419 Narcolepsy without cataplexy: Secondary | ICD-10-CM | POA: Diagnosis not present

## 2020-09-17 DIAGNOSIS — D649 Anemia, unspecified: Secondary | ICD-10-CM | POA: Diagnosis not present

## 2020-09-17 DIAGNOSIS — J449 Chronic obstructive pulmonary disease, unspecified: Secondary | ICD-10-CM | POA: Diagnosis not present

## 2020-09-17 DIAGNOSIS — E785 Hyperlipidemia, unspecified: Secondary | ICD-10-CM | POA: Diagnosis not present

## 2020-09-17 DIAGNOSIS — E46 Unspecified protein-calorie malnutrition: Secondary | ICD-10-CM | POA: Diagnosis not present

## 2020-09-17 DIAGNOSIS — R296 Repeated falls: Secondary | ICD-10-CM | POA: Diagnosis not present

## 2020-09-17 DIAGNOSIS — Z681 Body mass index (BMI) 19 or less, adult: Secondary | ICD-10-CM | POA: Diagnosis not present

## 2020-09-18 DIAGNOSIS — Z681 Body mass index (BMI) 19 or less, adult: Secondary | ICD-10-CM | POA: Diagnosis not present

## 2020-09-18 DIAGNOSIS — J449 Chronic obstructive pulmonary disease, unspecified: Secondary | ICD-10-CM | POA: Diagnosis not present

## 2020-09-18 DIAGNOSIS — G47419 Narcolepsy without cataplexy: Secondary | ICD-10-CM | POA: Diagnosis not present

## 2020-09-18 DIAGNOSIS — I10 Essential (primary) hypertension: Secondary | ICD-10-CM | POA: Diagnosis not present

## 2020-09-18 DIAGNOSIS — L89153 Pressure ulcer of sacral region, stage 3: Secondary | ICD-10-CM | POA: Diagnosis not present

## 2020-09-18 DIAGNOSIS — G35 Multiple sclerosis: Secondary | ICD-10-CM | POA: Diagnosis not present

## 2020-09-18 DIAGNOSIS — R296 Repeated falls: Secondary | ICD-10-CM | POA: Diagnosis not present

## 2020-09-18 DIAGNOSIS — E46 Unspecified protein-calorie malnutrition: Secondary | ICD-10-CM | POA: Diagnosis not present

## 2020-09-18 DIAGNOSIS — E785 Hyperlipidemia, unspecified: Secondary | ICD-10-CM | POA: Diagnosis not present

## 2020-09-18 DIAGNOSIS — D649 Anemia, unspecified: Secondary | ICD-10-CM | POA: Diagnosis not present

## 2020-09-18 DIAGNOSIS — Z8614 Personal history of Methicillin resistant Staphylococcus aureus infection: Secondary | ICD-10-CM | POA: Diagnosis not present

## 2020-09-19 DIAGNOSIS — R296 Repeated falls: Secondary | ICD-10-CM | POA: Diagnosis not present

## 2020-09-19 DIAGNOSIS — I10 Essential (primary) hypertension: Secondary | ICD-10-CM | POA: Diagnosis not present

## 2020-09-19 DIAGNOSIS — L89153 Pressure ulcer of sacral region, stage 3: Secondary | ICD-10-CM | POA: Diagnosis not present

## 2020-09-19 DIAGNOSIS — Z8614 Personal history of Methicillin resistant Staphylococcus aureus infection: Secondary | ICD-10-CM | POA: Diagnosis not present

## 2020-09-19 DIAGNOSIS — Z681 Body mass index (BMI) 19 or less, adult: Secondary | ICD-10-CM | POA: Diagnosis not present

## 2020-09-19 DIAGNOSIS — E46 Unspecified protein-calorie malnutrition: Secondary | ICD-10-CM | POA: Diagnosis not present

## 2020-09-19 DIAGNOSIS — G35 Multiple sclerosis: Secondary | ICD-10-CM | POA: Diagnosis not present

## 2020-09-19 DIAGNOSIS — E785 Hyperlipidemia, unspecified: Secondary | ICD-10-CM | POA: Diagnosis not present

## 2020-09-19 DIAGNOSIS — D649 Anemia, unspecified: Secondary | ICD-10-CM | POA: Diagnosis not present

## 2020-09-19 DIAGNOSIS — J449 Chronic obstructive pulmonary disease, unspecified: Secondary | ICD-10-CM | POA: Diagnosis not present

## 2020-09-19 DIAGNOSIS — G47419 Narcolepsy without cataplexy: Secondary | ICD-10-CM | POA: Diagnosis not present

## 2020-09-20 DIAGNOSIS — G47419 Narcolepsy without cataplexy: Secondary | ICD-10-CM | POA: Diagnosis not present

## 2020-09-20 DIAGNOSIS — J449 Chronic obstructive pulmonary disease, unspecified: Secondary | ICD-10-CM | POA: Diagnosis not present

## 2020-09-20 DIAGNOSIS — L89153 Pressure ulcer of sacral region, stage 3: Secondary | ICD-10-CM | POA: Diagnosis not present

## 2020-09-20 DIAGNOSIS — E46 Unspecified protein-calorie malnutrition: Secondary | ICD-10-CM | POA: Diagnosis not present

## 2020-09-20 DIAGNOSIS — Z681 Body mass index (BMI) 19 or less, adult: Secondary | ICD-10-CM | POA: Diagnosis not present

## 2020-09-20 DIAGNOSIS — R296 Repeated falls: Secondary | ICD-10-CM | POA: Diagnosis not present

## 2020-09-20 DIAGNOSIS — I10 Essential (primary) hypertension: Secondary | ICD-10-CM | POA: Diagnosis not present

## 2020-09-20 DIAGNOSIS — E785 Hyperlipidemia, unspecified: Secondary | ICD-10-CM | POA: Diagnosis not present

## 2020-09-20 DIAGNOSIS — Z8614 Personal history of Methicillin resistant Staphylococcus aureus infection: Secondary | ICD-10-CM | POA: Diagnosis not present

## 2020-09-20 DIAGNOSIS — D649 Anemia, unspecified: Secondary | ICD-10-CM | POA: Diagnosis not present

## 2020-09-20 DIAGNOSIS — G35 Multiple sclerosis: Secondary | ICD-10-CM | POA: Diagnosis not present

## 2020-09-21 DIAGNOSIS — E46 Unspecified protein-calorie malnutrition: Secondary | ICD-10-CM | POA: Diagnosis not present

## 2020-09-21 DIAGNOSIS — I10 Essential (primary) hypertension: Secondary | ICD-10-CM | POA: Diagnosis not present

## 2020-09-21 DIAGNOSIS — L89153 Pressure ulcer of sacral region, stage 3: Secondary | ICD-10-CM | POA: Diagnosis not present

## 2020-09-21 DIAGNOSIS — Z8614 Personal history of Methicillin resistant Staphylococcus aureus infection: Secondary | ICD-10-CM | POA: Diagnosis not present

## 2020-09-21 DIAGNOSIS — E785 Hyperlipidemia, unspecified: Secondary | ICD-10-CM | POA: Diagnosis not present

## 2020-09-21 DIAGNOSIS — G35 Multiple sclerosis: Secondary | ICD-10-CM | POA: Diagnosis not present

## 2020-09-21 DIAGNOSIS — Z681 Body mass index (BMI) 19 or less, adult: Secondary | ICD-10-CM | POA: Diagnosis not present

## 2020-09-21 DIAGNOSIS — G47419 Narcolepsy without cataplexy: Secondary | ICD-10-CM | POA: Diagnosis not present

## 2020-09-21 DIAGNOSIS — R296 Repeated falls: Secondary | ICD-10-CM | POA: Diagnosis not present

## 2020-09-21 DIAGNOSIS — J449 Chronic obstructive pulmonary disease, unspecified: Secondary | ICD-10-CM | POA: Diagnosis not present

## 2020-09-21 DIAGNOSIS — D649 Anemia, unspecified: Secondary | ICD-10-CM | POA: Diagnosis not present

## 2020-09-22 DIAGNOSIS — G47419 Narcolepsy without cataplexy: Secondary | ICD-10-CM | POA: Diagnosis not present

## 2020-09-22 DIAGNOSIS — Z681 Body mass index (BMI) 19 or less, adult: Secondary | ICD-10-CM | POA: Diagnosis not present

## 2020-09-22 DIAGNOSIS — Z8614 Personal history of Methicillin resistant Staphylococcus aureus infection: Secondary | ICD-10-CM | POA: Diagnosis not present

## 2020-09-22 DIAGNOSIS — R296 Repeated falls: Secondary | ICD-10-CM | POA: Diagnosis not present

## 2020-09-22 DIAGNOSIS — E785 Hyperlipidemia, unspecified: Secondary | ICD-10-CM | POA: Diagnosis not present

## 2020-09-22 DIAGNOSIS — L89153 Pressure ulcer of sacral region, stage 3: Secondary | ICD-10-CM | POA: Diagnosis not present

## 2020-09-22 DIAGNOSIS — D649 Anemia, unspecified: Secondary | ICD-10-CM | POA: Diagnosis not present

## 2020-09-22 DIAGNOSIS — E46 Unspecified protein-calorie malnutrition: Secondary | ICD-10-CM | POA: Diagnosis not present

## 2020-09-22 DIAGNOSIS — I10 Essential (primary) hypertension: Secondary | ICD-10-CM | POA: Diagnosis not present

## 2020-09-22 DIAGNOSIS — J449 Chronic obstructive pulmonary disease, unspecified: Secondary | ICD-10-CM | POA: Diagnosis not present

## 2020-09-22 DIAGNOSIS — G35 Multiple sclerosis: Secondary | ICD-10-CM | POA: Diagnosis not present

## 2020-09-23 DIAGNOSIS — L89153 Pressure ulcer of sacral region, stage 3: Secondary | ICD-10-CM | POA: Diagnosis not present

## 2020-09-23 DIAGNOSIS — G47419 Narcolepsy without cataplexy: Secondary | ICD-10-CM | POA: Diagnosis not present

## 2020-09-23 DIAGNOSIS — D649 Anemia, unspecified: Secondary | ICD-10-CM | POA: Diagnosis not present

## 2020-09-23 DIAGNOSIS — E46 Unspecified protein-calorie malnutrition: Secondary | ICD-10-CM | POA: Diagnosis not present

## 2020-09-23 DIAGNOSIS — Z8614 Personal history of Methicillin resistant Staphylococcus aureus infection: Secondary | ICD-10-CM | POA: Diagnosis not present

## 2020-09-23 DIAGNOSIS — E785 Hyperlipidemia, unspecified: Secondary | ICD-10-CM | POA: Diagnosis not present

## 2020-09-23 DIAGNOSIS — R296 Repeated falls: Secondary | ICD-10-CM | POA: Diagnosis not present

## 2020-09-23 DIAGNOSIS — Z681 Body mass index (BMI) 19 or less, adult: Secondary | ICD-10-CM | POA: Diagnosis not present

## 2020-09-23 DIAGNOSIS — G35 Multiple sclerosis: Secondary | ICD-10-CM | POA: Diagnosis not present

## 2020-09-23 DIAGNOSIS — J449 Chronic obstructive pulmonary disease, unspecified: Secondary | ICD-10-CM | POA: Diagnosis not present

## 2020-09-23 DIAGNOSIS — I10 Essential (primary) hypertension: Secondary | ICD-10-CM | POA: Diagnosis not present

## 2020-09-24 DIAGNOSIS — J449 Chronic obstructive pulmonary disease, unspecified: Secondary | ICD-10-CM | POA: Diagnosis not present

## 2020-09-24 DIAGNOSIS — E785 Hyperlipidemia, unspecified: Secondary | ICD-10-CM | POA: Diagnosis not present

## 2020-09-24 DIAGNOSIS — G47419 Narcolepsy without cataplexy: Secondary | ICD-10-CM | POA: Diagnosis not present

## 2020-09-24 DIAGNOSIS — G35 Multiple sclerosis: Secondary | ICD-10-CM | POA: Diagnosis not present

## 2020-09-24 DIAGNOSIS — R296 Repeated falls: Secondary | ICD-10-CM | POA: Diagnosis not present

## 2020-09-24 DIAGNOSIS — L89153 Pressure ulcer of sacral region, stage 3: Secondary | ICD-10-CM | POA: Diagnosis not present

## 2020-09-24 DIAGNOSIS — Z8614 Personal history of Methicillin resistant Staphylococcus aureus infection: Secondary | ICD-10-CM | POA: Diagnosis not present

## 2020-09-24 DIAGNOSIS — D649 Anemia, unspecified: Secondary | ICD-10-CM | POA: Diagnosis not present

## 2020-09-24 DIAGNOSIS — Z681 Body mass index (BMI) 19 or less, adult: Secondary | ICD-10-CM | POA: Diagnosis not present

## 2020-09-24 DIAGNOSIS — E46 Unspecified protein-calorie malnutrition: Secondary | ICD-10-CM | POA: Diagnosis not present

## 2020-09-24 DIAGNOSIS — I10 Essential (primary) hypertension: Secondary | ICD-10-CM | POA: Diagnosis not present

## 2020-09-25 DIAGNOSIS — R296 Repeated falls: Secondary | ICD-10-CM | POA: Diagnosis not present

## 2020-09-25 DIAGNOSIS — J449 Chronic obstructive pulmonary disease, unspecified: Secondary | ICD-10-CM | POA: Diagnosis not present

## 2020-09-25 DIAGNOSIS — L89153 Pressure ulcer of sacral region, stage 3: Secondary | ICD-10-CM | POA: Diagnosis not present

## 2020-09-25 DIAGNOSIS — G35 Multiple sclerosis: Secondary | ICD-10-CM | POA: Diagnosis not present

## 2020-09-25 DIAGNOSIS — I10 Essential (primary) hypertension: Secondary | ICD-10-CM | POA: Diagnosis not present

## 2020-09-25 DIAGNOSIS — E785 Hyperlipidemia, unspecified: Secondary | ICD-10-CM | POA: Diagnosis not present

## 2020-09-25 DIAGNOSIS — G47419 Narcolepsy without cataplexy: Secondary | ICD-10-CM | POA: Diagnosis not present

## 2020-09-25 DIAGNOSIS — D649 Anemia, unspecified: Secondary | ICD-10-CM | POA: Diagnosis not present

## 2020-09-25 DIAGNOSIS — Z681 Body mass index (BMI) 19 or less, adult: Secondary | ICD-10-CM | POA: Diagnosis not present

## 2020-09-25 DIAGNOSIS — E46 Unspecified protein-calorie malnutrition: Secondary | ICD-10-CM | POA: Diagnosis not present

## 2020-09-25 DIAGNOSIS — Z8614 Personal history of Methicillin resistant Staphylococcus aureus infection: Secondary | ICD-10-CM | POA: Diagnosis not present

## 2020-09-26 ENCOUNTER — Other Ambulatory Visit: Payer: Self-pay | Admitting: Physical Medicine and Rehabilitation

## 2020-09-26 DIAGNOSIS — Z681 Body mass index (BMI) 19 or less, adult: Secondary | ICD-10-CM | POA: Diagnosis not present

## 2020-09-26 DIAGNOSIS — D649 Anemia, unspecified: Secondary | ICD-10-CM | POA: Diagnosis not present

## 2020-09-26 DIAGNOSIS — E46 Unspecified protein-calorie malnutrition: Secondary | ICD-10-CM | POA: Diagnosis not present

## 2020-09-26 DIAGNOSIS — G35 Multiple sclerosis: Secondary | ICD-10-CM | POA: Diagnosis not present

## 2020-09-26 DIAGNOSIS — J449 Chronic obstructive pulmonary disease, unspecified: Secondary | ICD-10-CM | POA: Diagnosis not present

## 2020-09-26 DIAGNOSIS — G47419 Narcolepsy without cataplexy: Secondary | ICD-10-CM | POA: Diagnosis not present

## 2020-09-26 DIAGNOSIS — L89153 Pressure ulcer of sacral region, stage 3: Secondary | ICD-10-CM | POA: Diagnosis not present

## 2020-09-26 DIAGNOSIS — E785 Hyperlipidemia, unspecified: Secondary | ICD-10-CM | POA: Diagnosis not present

## 2020-09-26 DIAGNOSIS — R296 Repeated falls: Secondary | ICD-10-CM | POA: Diagnosis not present

## 2020-09-26 DIAGNOSIS — I10 Essential (primary) hypertension: Secondary | ICD-10-CM | POA: Diagnosis not present

## 2020-09-26 DIAGNOSIS — Z8614 Personal history of Methicillin resistant Staphylococcus aureus infection: Secondary | ICD-10-CM | POA: Diagnosis not present

## 2020-09-27 DIAGNOSIS — L89153 Pressure ulcer of sacral region, stage 3: Secondary | ICD-10-CM | POA: Diagnosis not present

## 2020-09-27 DIAGNOSIS — G47419 Narcolepsy without cataplexy: Secondary | ICD-10-CM | POA: Diagnosis not present

## 2020-09-27 DIAGNOSIS — Z8614 Personal history of Methicillin resistant Staphylococcus aureus infection: Secondary | ICD-10-CM | POA: Diagnosis not present

## 2020-09-27 DIAGNOSIS — J449 Chronic obstructive pulmonary disease, unspecified: Secondary | ICD-10-CM | POA: Diagnosis not present

## 2020-09-27 DIAGNOSIS — Z681 Body mass index (BMI) 19 or less, adult: Secondary | ICD-10-CM | POA: Diagnosis not present

## 2020-09-27 DIAGNOSIS — G35 Multiple sclerosis: Secondary | ICD-10-CM | POA: Diagnosis not present

## 2020-09-27 DIAGNOSIS — R296 Repeated falls: Secondary | ICD-10-CM | POA: Diagnosis not present

## 2020-09-27 DIAGNOSIS — I10 Essential (primary) hypertension: Secondary | ICD-10-CM | POA: Diagnosis not present

## 2020-09-27 DIAGNOSIS — E46 Unspecified protein-calorie malnutrition: Secondary | ICD-10-CM | POA: Diagnosis not present

## 2020-09-27 DIAGNOSIS — D649 Anemia, unspecified: Secondary | ICD-10-CM | POA: Diagnosis not present

## 2020-09-27 DIAGNOSIS — E785 Hyperlipidemia, unspecified: Secondary | ICD-10-CM | POA: Diagnosis not present

## 2020-09-28 DIAGNOSIS — R296 Repeated falls: Secondary | ICD-10-CM | POA: Diagnosis not present

## 2020-09-28 DIAGNOSIS — G47419 Narcolepsy without cataplexy: Secondary | ICD-10-CM | POA: Diagnosis not present

## 2020-09-28 DIAGNOSIS — I10 Essential (primary) hypertension: Secondary | ICD-10-CM | POA: Diagnosis not present

## 2020-09-28 DIAGNOSIS — L89153 Pressure ulcer of sacral region, stage 3: Secondary | ICD-10-CM | POA: Diagnosis not present

## 2020-09-28 DIAGNOSIS — D649 Anemia, unspecified: Secondary | ICD-10-CM | POA: Diagnosis not present

## 2020-09-28 DIAGNOSIS — E785 Hyperlipidemia, unspecified: Secondary | ICD-10-CM | POA: Diagnosis not present

## 2020-09-28 DIAGNOSIS — Z681 Body mass index (BMI) 19 or less, adult: Secondary | ICD-10-CM | POA: Diagnosis not present

## 2020-09-28 DIAGNOSIS — Z8614 Personal history of Methicillin resistant Staphylococcus aureus infection: Secondary | ICD-10-CM | POA: Diagnosis not present

## 2020-09-28 DIAGNOSIS — J449 Chronic obstructive pulmonary disease, unspecified: Secondary | ICD-10-CM | POA: Diagnosis not present

## 2020-09-28 DIAGNOSIS — E46 Unspecified protein-calorie malnutrition: Secondary | ICD-10-CM | POA: Diagnosis not present

## 2020-09-28 DIAGNOSIS — G35 Multiple sclerosis: Secondary | ICD-10-CM | POA: Diagnosis not present

## 2020-10-02 ENCOUNTER — Ambulatory Visit (INDEPENDENT_AMBULATORY_CARE_PROVIDER_SITE_OTHER): Payer: Medicaid Other | Admitting: Internal Medicine

## 2020-10-02 ENCOUNTER — Encounter (INDEPENDENT_AMBULATORY_CARE_PROVIDER_SITE_OTHER): Payer: Self-pay

## 2020-10-08 DIAGNOSIS — G894 Chronic pain syndrome: Secondary | ICD-10-CM | POA: Diagnosis not present

## 2020-10-08 DIAGNOSIS — R296 Repeated falls: Secondary | ICD-10-CM | POA: Diagnosis not present

## 2020-10-08 DIAGNOSIS — G250 Essential tremor: Secondary | ICD-10-CM | POA: Diagnosis not present

## 2020-10-08 DIAGNOSIS — I10 Essential (primary) hypertension: Secondary | ICD-10-CM | POA: Diagnosis not present

## 2020-10-08 DIAGNOSIS — I952 Hypotension due to drugs: Secondary | ICD-10-CM | POA: Diagnosis not present

## 2020-10-08 DIAGNOSIS — Z79899 Other long term (current) drug therapy: Secondary | ICD-10-CM | POA: Diagnosis not present

## 2020-10-08 DIAGNOSIS — R261 Paralytic gait: Secondary | ICD-10-CM | POA: Diagnosis not present

## 2020-10-08 DIAGNOSIS — G35 Multiple sclerosis: Secondary | ICD-10-CM | POA: Diagnosis not present

## 2020-10-16 ENCOUNTER — Other Ambulatory Visit: Payer: Self-pay | Admitting: Physical Medicine and Rehabilitation

## 2020-10-17 ENCOUNTER — Other Ambulatory Visit: Payer: Self-pay | Admitting: Physical Medicine and Rehabilitation

## 2020-10-21 ENCOUNTER — Encounter (HOSPITAL_COMMUNITY): Admission: RE | Admit: 2020-10-21 | Payer: Medicaid Other | Source: Ambulatory Visit

## 2020-10-22 ENCOUNTER — Encounter (HOSPITAL_COMMUNITY): Admission: RE | Admit: 2020-10-22 | Payer: Medicaid Other | Source: Ambulatory Visit

## 2020-10-23 ENCOUNTER — Encounter (HOSPITAL_COMMUNITY)
Admission: RE | Admit: 2020-10-23 | Discharge: 2020-10-23 | Disposition: A | Discharge status: Home / Self Care | Payer: Medicaid Other | Source: Ambulatory Visit | Attending: Neurology | Admitting: Neurology

## 2020-10-27 DIAGNOSIS — G35 Multiple sclerosis: Secondary | ICD-10-CM | POA: Diagnosis not present

## 2020-10-28 ENCOUNTER — Encounter (HOSPITAL_COMMUNITY): Admission: RE | Admit: 2020-10-28 | Payer: Medicaid Other | Source: Ambulatory Visit

## 2020-10-28 DIAGNOSIS — G35 Multiple sclerosis: Secondary | ICD-10-CM | POA: Diagnosis not present

## 2020-10-29 ENCOUNTER — Encounter (HOSPITAL_COMMUNITY): Admission: RE | Admit: 2020-10-29 | Payer: Medicaid Other | Source: Ambulatory Visit

## 2020-10-29 DIAGNOSIS — G35 Multiple sclerosis: Secondary | ICD-10-CM | POA: Diagnosis not present

## 2020-10-30 ENCOUNTER — Encounter (HOSPITAL_COMMUNITY): Admission: RE | Admit: 2020-10-30 | Payer: Medicaid Other | Source: Ambulatory Visit

## 2020-10-30 DIAGNOSIS — E46 Unspecified protein-calorie malnutrition: Secondary | ICD-10-CM | POA: Diagnosis not present

## 2020-10-30 DIAGNOSIS — G47419 Narcolepsy without cataplexy: Secondary | ICD-10-CM | POA: Diagnosis not present

## 2020-10-30 DIAGNOSIS — D649 Anemia, unspecified: Secondary | ICD-10-CM | POA: Diagnosis not present

## 2020-10-30 DIAGNOSIS — L89153 Pressure ulcer of sacral region, stage 3: Secondary | ICD-10-CM | POA: Diagnosis not present

## 2020-10-30 DIAGNOSIS — Z8614 Personal history of Methicillin resistant Staphylococcus aureus infection: Secondary | ICD-10-CM | POA: Diagnosis not present

## 2020-10-30 DIAGNOSIS — I10 Essential (primary) hypertension: Secondary | ICD-10-CM | POA: Diagnosis not present

## 2020-10-30 DIAGNOSIS — Z681 Body mass index (BMI) 19 or less, adult: Secondary | ICD-10-CM | POA: Diagnosis not present

## 2020-10-30 DIAGNOSIS — E785 Hyperlipidemia, unspecified: Secondary | ICD-10-CM | POA: Diagnosis not present

## 2020-10-30 DIAGNOSIS — R296 Repeated falls: Secondary | ICD-10-CM | POA: Diagnosis not present

## 2020-10-30 DIAGNOSIS — J449 Chronic obstructive pulmonary disease, unspecified: Secondary | ICD-10-CM | POA: Diagnosis not present

## 2020-10-30 DIAGNOSIS — G35 Multiple sclerosis: Secondary | ICD-10-CM | POA: Diagnosis not present

## 2020-10-31 DIAGNOSIS — Z8614 Personal history of Methicillin resistant Staphylococcus aureus infection: Secondary | ICD-10-CM | POA: Diagnosis not present

## 2020-10-31 DIAGNOSIS — R296 Repeated falls: Secondary | ICD-10-CM | POA: Diagnosis not present

## 2020-10-31 DIAGNOSIS — Z681 Body mass index (BMI) 19 or less, adult: Secondary | ICD-10-CM | POA: Diagnosis not present

## 2020-10-31 DIAGNOSIS — E46 Unspecified protein-calorie malnutrition: Secondary | ICD-10-CM | POA: Diagnosis not present

## 2020-10-31 DIAGNOSIS — G35 Multiple sclerosis: Secondary | ICD-10-CM | POA: Diagnosis not present

## 2020-10-31 DIAGNOSIS — I10 Essential (primary) hypertension: Secondary | ICD-10-CM | POA: Diagnosis not present

## 2020-10-31 DIAGNOSIS — D649 Anemia, unspecified: Secondary | ICD-10-CM | POA: Diagnosis not present

## 2020-10-31 DIAGNOSIS — L89153 Pressure ulcer of sacral region, stage 3: Secondary | ICD-10-CM | POA: Diagnosis not present

## 2020-10-31 DIAGNOSIS — E785 Hyperlipidemia, unspecified: Secondary | ICD-10-CM | POA: Diagnosis not present

## 2020-10-31 DIAGNOSIS — J449 Chronic obstructive pulmonary disease, unspecified: Secondary | ICD-10-CM | POA: Diagnosis not present

## 2020-10-31 DIAGNOSIS — G47419 Narcolepsy without cataplexy: Secondary | ICD-10-CM | POA: Diagnosis not present

## 2020-11-01 DIAGNOSIS — Z8614 Personal history of Methicillin resistant Staphylococcus aureus infection: Secondary | ICD-10-CM | POA: Diagnosis not present

## 2020-11-01 DIAGNOSIS — L89153 Pressure ulcer of sacral region, stage 3: Secondary | ICD-10-CM | POA: Diagnosis not present

## 2020-11-01 DIAGNOSIS — G35 Multiple sclerosis: Secondary | ICD-10-CM | POA: Diagnosis not present

## 2020-11-01 DIAGNOSIS — R296 Repeated falls: Secondary | ICD-10-CM | POA: Diagnosis not present

## 2020-11-01 DIAGNOSIS — D649 Anemia, unspecified: Secondary | ICD-10-CM | POA: Diagnosis not present

## 2020-11-01 DIAGNOSIS — E46 Unspecified protein-calorie malnutrition: Secondary | ICD-10-CM | POA: Diagnosis not present

## 2020-11-01 DIAGNOSIS — I10 Essential (primary) hypertension: Secondary | ICD-10-CM | POA: Diagnosis not present

## 2020-11-01 DIAGNOSIS — Z681 Body mass index (BMI) 19 or less, adult: Secondary | ICD-10-CM | POA: Diagnosis not present

## 2020-11-01 DIAGNOSIS — G47419 Narcolepsy without cataplexy: Secondary | ICD-10-CM | POA: Diagnosis not present

## 2020-11-01 DIAGNOSIS — E785 Hyperlipidemia, unspecified: Secondary | ICD-10-CM | POA: Diagnosis not present

## 2020-11-01 DIAGNOSIS — J449 Chronic obstructive pulmonary disease, unspecified: Secondary | ICD-10-CM | POA: Diagnosis not present

## 2020-11-02 DIAGNOSIS — G35 Multiple sclerosis: Secondary | ICD-10-CM | POA: Diagnosis not present

## 2020-11-02 DIAGNOSIS — J449 Chronic obstructive pulmonary disease, unspecified: Secondary | ICD-10-CM | POA: Diagnosis not present

## 2020-11-02 DIAGNOSIS — E46 Unspecified protein-calorie malnutrition: Secondary | ICD-10-CM | POA: Diagnosis not present

## 2020-11-02 DIAGNOSIS — G47419 Narcolepsy without cataplexy: Secondary | ICD-10-CM | POA: Diagnosis not present

## 2020-11-02 DIAGNOSIS — Z681 Body mass index (BMI) 19 or less, adult: Secondary | ICD-10-CM | POA: Diagnosis not present

## 2020-11-02 DIAGNOSIS — D649 Anemia, unspecified: Secondary | ICD-10-CM | POA: Diagnosis not present

## 2020-11-02 DIAGNOSIS — Z8614 Personal history of Methicillin resistant Staphylococcus aureus infection: Secondary | ICD-10-CM | POA: Diagnosis not present

## 2020-11-02 DIAGNOSIS — E785 Hyperlipidemia, unspecified: Secondary | ICD-10-CM | POA: Diagnosis not present

## 2020-11-02 DIAGNOSIS — R296 Repeated falls: Secondary | ICD-10-CM | POA: Diagnosis not present

## 2020-11-02 DIAGNOSIS — I10 Essential (primary) hypertension: Secondary | ICD-10-CM | POA: Diagnosis not present

## 2020-11-02 DIAGNOSIS — L89153 Pressure ulcer of sacral region, stage 3: Secondary | ICD-10-CM | POA: Diagnosis not present

## 2020-11-03 DIAGNOSIS — Z681 Body mass index (BMI) 19 or less, adult: Secondary | ICD-10-CM | POA: Diagnosis not present

## 2020-11-03 DIAGNOSIS — L89153 Pressure ulcer of sacral region, stage 3: Secondary | ICD-10-CM | POA: Diagnosis not present

## 2020-11-03 DIAGNOSIS — R296 Repeated falls: Secondary | ICD-10-CM | POA: Diagnosis not present

## 2020-11-03 DIAGNOSIS — I10 Essential (primary) hypertension: Secondary | ICD-10-CM | POA: Diagnosis not present

## 2020-11-03 DIAGNOSIS — Z8614 Personal history of Methicillin resistant Staphylococcus aureus infection: Secondary | ICD-10-CM | POA: Diagnosis not present

## 2020-11-03 DIAGNOSIS — G47419 Narcolepsy without cataplexy: Secondary | ICD-10-CM | POA: Diagnosis not present

## 2020-11-03 DIAGNOSIS — J449 Chronic obstructive pulmonary disease, unspecified: Secondary | ICD-10-CM | POA: Diagnosis not present

## 2020-11-03 DIAGNOSIS — D649 Anemia, unspecified: Secondary | ICD-10-CM | POA: Diagnosis not present

## 2020-11-03 DIAGNOSIS — G35 Multiple sclerosis: Secondary | ICD-10-CM | POA: Diagnosis not present

## 2020-11-03 DIAGNOSIS — E785 Hyperlipidemia, unspecified: Secondary | ICD-10-CM | POA: Diagnosis not present

## 2020-11-03 DIAGNOSIS — E46 Unspecified protein-calorie malnutrition: Secondary | ICD-10-CM | POA: Diagnosis not present

## 2020-11-04 ENCOUNTER — Encounter (HOSPITAL_COMMUNITY)
Admission: RE | Admit: 2020-11-04 | Discharge: 2020-11-04 | Disposition: A | Discharge status: Home / Self Care | Payer: Medicaid Other | Source: Ambulatory Visit | Attending: Neurology | Admitting: Neurology

## 2020-11-04 DIAGNOSIS — G35 Multiple sclerosis: Secondary | ICD-10-CM | POA: Diagnosis not present

## 2020-11-04 DIAGNOSIS — Z681 Body mass index (BMI) 19 or less, adult: Secondary | ICD-10-CM | POA: Diagnosis not present

## 2020-11-04 DIAGNOSIS — E785 Hyperlipidemia, unspecified: Secondary | ICD-10-CM | POA: Diagnosis not present

## 2020-11-04 DIAGNOSIS — J449 Chronic obstructive pulmonary disease, unspecified: Secondary | ICD-10-CM | POA: Diagnosis not present

## 2020-11-04 DIAGNOSIS — G47419 Narcolepsy without cataplexy: Secondary | ICD-10-CM | POA: Diagnosis not present

## 2020-11-04 DIAGNOSIS — I10 Essential (primary) hypertension: Secondary | ICD-10-CM | POA: Diagnosis not present

## 2020-11-04 DIAGNOSIS — L89153 Pressure ulcer of sacral region, stage 3: Secondary | ICD-10-CM | POA: Diagnosis not present

## 2020-11-04 DIAGNOSIS — D649 Anemia, unspecified: Secondary | ICD-10-CM | POA: Diagnosis not present

## 2020-11-04 DIAGNOSIS — R296 Repeated falls: Secondary | ICD-10-CM | POA: Diagnosis not present

## 2020-11-04 DIAGNOSIS — Z8614 Personal history of Methicillin resistant Staphylococcus aureus infection: Secondary | ICD-10-CM | POA: Diagnosis not present

## 2020-11-04 DIAGNOSIS — E46 Unspecified protein-calorie malnutrition: Secondary | ICD-10-CM | POA: Diagnosis not present

## 2020-11-05 ENCOUNTER — Encounter (HOSPITAL_COMMUNITY)
Admission: RE | Admit: 2020-11-05 | Discharge: 2020-11-05 | Disposition: A | Discharge status: Home / Self Care | Payer: Medicaid Other | Source: Ambulatory Visit | Attending: Neurology | Admitting: Neurology

## 2020-11-05 DIAGNOSIS — G47419 Narcolepsy without cataplexy: Secondary | ICD-10-CM | POA: Diagnosis not present

## 2020-11-05 DIAGNOSIS — E785 Hyperlipidemia, unspecified: Secondary | ICD-10-CM | POA: Diagnosis not present

## 2020-11-05 DIAGNOSIS — G35 Multiple sclerosis: Secondary | ICD-10-CM | POA: Diagnosis not present

## 2020-11-05 DIAGNOSIS — E46 Unspecified protein-calorie malnutrition: Secondary | ICD-10-CM | POA: Diagnosis not present

## 2020-11-05 DIAGNOSIS — Z8614 Personal history of Methicillin resistant Staphylococcus aureus infection: Secondary | ICD-10-CM | POA: Diagnosis not present

## 2020-11-05 DIAGNOSIS — I10 Essential (primary) hypertension: Secondary | ICD-10-CM | POA: Diagnosis not present

## 2020-11-05 DIAGNOSIS — R296 Repeated falls: Secondary | ICD-10-CM | POA: Diagnosis not present

## 2020-11-05 DIAGNOSIS — D649 Anemia, unspecified: Secondary | ICD-10-CM | POA: Diagnosis not present

## 2020-11-05 DIAGNOSIS — J449 Chronic obstructive pulmonary disease, unspecified: Secondary | ICD-10-CM | POA: Diagnosis not present

## 2020-11-05 DIAGNOSIS — L89153 Pressure ulcer of sacral region, stage 3: Secondary | ICD-10-CM | POA: Diagnosis not present

## 2020-11-05 DIAGNOSIS — Z681 Body mass index (BMI) 19 or less, adult: Secondary | ICD-10-CM | POA: Diagnosis not present

## 2020-11-06 ENCOUNTER — Encounter (HOSPITAL_COMMUNITY): Admission: RE | Admit: 2020-11-06 | Payer: Medicaid Other | Source: Ambulatory Visit

## 2020-11-06 DIAGNOSIS — L89153 Pressure ulcer of sacral region, stage 3: Secondary | ICD-10-CM | POA: Diagnosis not present

## 2020-11-06 DIAGNOSIS — E785 Hyperlipidemia, unspecified: Secondary | ICD-10-CM | POA: Diagnosis not present

## 2020-11-06 DIAGNOSIS — G47419 Narcolepsy without cataplexy: Secondary | ICD-10-CM | POA: Diagnosis not present

## 2020-11-06 DIAGNOSIS — R296 Repeated falls: Secondary | ICD-10-CM | POA: Diagnosis not present

## 2020-11-06 DIAGNOSIS — E46 Unspecified protein-calorie malnutrition: Secondary | ICD-10-CM | POA: Diagnosis not present

## 2020-11-06 DIAGNOSIS — D649 Anemia, unspecified: Secondary | ICD-10-CM | POA: Diagnosis not present

## 2020-11-06 DIAGNOSIS — I10 Essential (primary) hypertension: Secondary | ICD-10-CM | POA: Diagnosis not present

## 2020-11-06 DIAGNOSIS — G35 Multiple sclerosis: Secondary | ICD-10-CM | POA: Diagnosis not present

## 2020-11-06 DIAGNOSIS — Z681 Body mass index (BMI) 19 or less, adult: Secondary | ICD-10-CM | POA: Diagnosis not present

## 2020-11-06 DIAGNOSIS — J449 Chronic obstructive pulmonary disease, unspecified: Secondary | ICD-10-CM | POA: Diagnosis not present

## 2020-11-06 DIAGNOSIS — Z8614 Personal history of Methicillin resistant Staphylococcus aureus infection: Secondary | ICD-10-CM | POA: Diagnosis not present

## 2020-11-07 DIAGNOSIS — D649 Anemia, unspecified: Secondary | ICD-10-CM | POA: Diagnosis not present

## 2020-11-07 DIAGNOSIS — E785 Hyperlipidemia, unspecified: Secondary | ICD-10-CM | POA: Diagnosis not present

## 2020-11-07 DIAGNOSIS — L89153 Pressure ulcer of sacral region, stage 3: Secondary | ICD-10-CM | POA: Diagnosis not present

## 2020-11-07 DIAGNOSIS — I10 Essential (primary) hypertension: Secondary | ICD-10-CM | POA: Diagnosis not present

## 2020-11-07 DIAGNOSIS — Z681 Body mass index (BMI) 19 or less, adult: Secondary | ICD-10-CM | POA: Diagnosis not present

## 2020-11-07 DIAGNOSIS — Z8614 Personal history of Methicillin resistant Staphylococcus aureus infection: Secondary | ICD-10-CM | POA: Diagnosis not present

## 2020-11-07 DIAGNOSIS — G35 Multiple sclerosis: Secondary | ICD-10-CM | POA: Diagnosis not present

## 2020-11-07 DIAGNOSIS — J449 Chronic obstructive pulmonary disease, unspecified: Secondary | ICD-10-CM | POA: Diagnosis not present

## 2020-11-07 DIAGNOSIS — G47419 Narcolepsy without cataplexy: Secondary | ICD-10-CM | POA: Diagnosis not present

## 2020-11-07 DIAGNOSIS — E46 Unspecified protein-calorie malnutrition: Secondary | ICD-10-CM | POA: Diagnosis not present

## 2020-11-07 DIAGNOSIS — R296 Repeated falls: Secondary | ICD-10-CM | POA: Diagnosis not present

## 2020-11-08 DIAGNOSIS — E46 Unspecified protein-calorie malnutrition: Secondary | ICD-10-CM | POA: Diagnosis not present

## 2020-11-08 DIAGNOSIS — D649 Anemia, unspecified: Secondary | ICD-10-CM | POA: Diagnosis not present

## 2020-11-08 DIAGNOSIS — J449 Chronic obstructive pulmonary disease, unspecified: Secondary | ICD-10-CM | POA: Diagnosis not present

## 2020-11-08 DIAGNOSIS — Z8614 Personal history of Methicillin resistant Staphylococcus aureus infection: Secondary | ICD-10-CM | POA: Diagnosis not present

## 2020-11-08 DIAGNOSIS — G47419 Narcolepsy without cataplexy: Secondary | ICD-10-CM | POA: Diagnosis not present

## 2020-11-08 DIAGNOSIS — Z681 Body mass index (BMI) 19 or less, adult: Secondary | ICD-10-CM | POA: Diagnosis not present

## 2020-11-08 DIAGNOSIS — R296 Repeated falls: Secondary | ICD-10-CM | POA: Diagnosis not present

## 2020-11-08 DIAGNOSIS — E785 Hyperlipidemia, unspecified: Secondary | ICD-10-CM | POA: Diagnosis not present

## 2020-11-08 DIAGNOSIS — I10 Essential (primary) hypertension: Secondary | ICD-10-CM | POA: Diagnosis not present

## 2020-11-08 DIAGNOSIS — G35 Multiple sclerosis: Secondary | ICD-10-CM | POA: Diagnosis not present

## 2020-11-08 DIAGNOSIS — L89153 Pressure ulcer of sacral region, stage 3: Secondary | ICD-10-CM | POA: Diagnosis not present

## 2020-11-09 DIAGNOSIS — E785 Hyperlipidemia, unspecified: Secondary | ICD-10-CM | POA: Diagnosis not present

## 2020-11-09 DIAGNOSIS — Z681 Body mass index (BMI) 19 or less, adult: Secondary | ICD-10-CM | POA: Diagnosis not present

## 2020-11-09 DIAGNOSIS — J449 Chronic obstructive pulmonary disease, unspecified: Secondary | ICD-10-CM | POA: Diagnosis not present

## 2020-11-09 DIAGNOSIS — G47419 Narcolepsy without cataplexy: Secondary | ICD-10-CM | POA: Diagnosis not present

## 2020-11-09 DIAGNOSIS — D649 Anemia, unspecified: Secondary | ICD-10-CM | POA: Diagnosis not present

## 2020-11-09 DIAGNOSIS — Z8614 Personal history of Methicillin resistant Staphylococcus aureus infection: Secondary | ICD-10-CM | POA: Diagnosis not present

## 2020-11-09 DIAGNOSIS — E46 Unspecified protein-calorie malnutrition: Secondary | ICD-10-CM | POA: Diagnosis not present

## 2020-11-09 DIAGNOSIS — R296 Repeated falls: Secondary | ICD-10-CM | POA: Diagnosis not present

## 2020-11-09 DIAGNOSIS — G35 Multiple sclerosis: Secondary | ICD-10-CM | POA: Diagnosis not present

## 2020-11-09 DIAGNOSIS — I10 Essential (primary) hypertension: Secondary | ICD-10-CM | POA: Diagnosis not present

## 2020-11-09 DIAGNOSIS — L89153 Pressure ulcer of sacral region, stage 3: Secondary | ICD-10-CM | POA: Diagnosis not present

## 2020-11-10 DIAGNOSIS — G47419 Narcolepsy without cataplexy: Secondary | ICD-10-CM | POA: Diagnosis not present

## 2020-11-10 DIAGNOSIS — D649 Anemia, unspecified: Secondary | ICD-10-CM | POA: Diagnosis not present

## 2020-11-10 DIAGNOSIS — E785 Hyperlipidemia, unspecified: Secondary | ICD-10-CM | POA: Diagnosis not present

## 2020-11-10 DIAGNOSIS — G35 Multiple sclerosis: Secondary | ICD-10-CM | POA: Diagnosis not present

## 2020-11-10 DIAGNOSIS — E46 Unspecified protein-calorie malnutrition: Secondary | ICD-10-CM | POA: Diagnosis not present

## 2020-11-10 DIAGNOSIS — L89153 Pressure ulcer of sacral region, stage 3: Secondary | ICD-10-CM | POA: Diagnosis not present

## 2020-11-10 DIAGNOSIS — I10 Essential (primary) hypertension: Secondary | ICD-10-CM | POA: Diagnosis not present

## 2020-11-10 DIAGNOSIS — Z8614 Personal history of Methicillin resistant Staphylococcus aureus infection: Secondary | ICD-10-CM | POA: Diagnosis not present

## 2020-11-10 DIAGNOSIS — Z681 Body mass index (BMI) 19 or less, adult: Secondary | ICD-10-CM | POA: Diagnosis not present

## 2020-11-10 DIAGNOSIS — R296 Repeated falls: Secondary | ICD-10-CM | POA: Diagnosis not present

## 2020-11-10 DIAGNOSIS — J449 Chronic obstructive pulmonary disease, unspecified: Secondary | ICD-10-CM | POA: Diagnosis not present

## 2020-11-11 DIAGNOSIS — Z681 Body mass index (BMI) 19 or less, adult: Secondary | ICD-10-CM | POA: Diagnosis not present

## 2020-11-11 DIAGNOSIS — Z8614 Personal history of Methicillin resistant Staphylococcus aureus infection: Secondary | ICD-10-CM | POA: Diagnosis not present

## 2020-11-11 DIAGNOSIS — E46 Unspecified protein-calorie malnutrition: Secondary | ICD-10-CM | POA: Diagnosis not present

## 2020-11-11 DIAGNOSIS — I10 Essential (primary) hypertension: Secondary | ICD-10-CM | POA: Diagnosis not present

## 2020-11-11 DIAGNOSIS — G47419 Narcolepsy without cataplexy: Secondary | ICD-10-CM | POA: Diagnosis not present

## 2020-11-11 DIAGNOSIS — L89153 Pressure ulcer of sacral region, stage 3: Secondary | ICD-10-CM | POA: Diagnosis not present

## 2020-11-11 DIAGNOSIS — G35 Multiple sclerosis: Secondary | ICD-10-CM | POA: Diagnosis not present

## 2020-11-11 DIAGNOSIS — E785 Hyperlipidemia, unspecified: Secondary | ICD-10-CM | POA: Diagnosis not present

## 2020-11-11 DIAGNOSIS — R296 Repeated falls: Secondary | ICD-10-CM | POA: Diagnosis not present

## 2020-11-11 DIAGNOSIS — J449 Chronic obstructive pulmonary disease, unspecified: Secondary | ICD-10-CM | POA: Diagnosis not present

## 2020-11-11 DIAGNOSIS — D649 Anemia, unspecified: Secondary | ICD-10-CM | POA: Diagnosis not present

## 2020-11-12 DIAGNOSIS — J449 Chronic obstructive pulmonary disease, unspecified: Secondary | ICD-10-CM | POA: Diagnosis not present

## 2020-11-12 DIAGNOSIS — E785 Hyperlipidemia, unspecified: Secondary | ICD-10-CM | POA: Diagnosis not present

## 2020-11-12 DIAGNOSIS — I10 Essential (primary) hypertension: Secondary | ICD-10-CM | POA: Diagnosis not present

## 2020-11-12 DIAGNOSIS — R296 Repeated falls: Secondary | ICD-10-CM | POA: Diagnosis not present

## 2020-11-12 DIAGNOSIS — E46 Unspecified protein-calorie malnutrition: Secondary | ICD-10-CM | POA: Diagnosis not present

## 2020-11-12 DIAGNOSIS — Z8614 Personal history of Methicillin resistant Staphylococcus aureus infection: Secondary | ICD-10-CM | POA: Diagnosis not present

## 2020-11-12 DIAGNOSIS — G47419 Narcolepsy without cataplexy: Secondary | ICD-10-CM | POA: Diagnosis not present

## 2020-11-12 DIAGNOSIS — Z681 Body mass index (BMI) 19 or less, adult: Secondary | ICD-10-CM | POA: Diagnosis not present

## 2020-11-12 DIAGNOSIS — L89153 Pressure ulcer of sacral region, stage 3: Secondary | ICD-10-CM | POA: Diagnosis not present

## 2020-11-12 DIAGNOSIS — G35 Multiple sclerosis: Secondary | ICD-10-CM | POA: Diagnosis not present

## 2020-11-12 DIAGNOSIS — D649 Anemia, unspecified: Secondary | ICD-10-CM | POA: Diagnosis not present

## 2020-11-13 DIAGNOSIS — G47419 Narcolepsy without cataplexy: Secondary | ICD-10-CM | POA: Diagnosis not present

## 2020-11-13 DIAGNOSIS — E785 Hyperlipidemia, unspecified: Secondary | ICD-10-CM | POA: Diagnosis not present

## 2020-11-13 DIAGNOSIS — J449 Chronic obstructive pulmonary disease, unspecified: Secondary | ICD-10-CM | POA: Diagnosis not present

## 2020-11-13 DIAGNOSIS — Z8614 Personal history of Methicillin resistant Staphylococcus aureus infection: Secondary | ICD-10-CM | POA: Diagnosis not present

## 2020-11-13 DIAGNOSIS — D649 Anemia, unspecified: Secondary | ICD-10-CM | POA: Diagnosis not present

## 2020-11-13 DIAGNOSIS — L89153 Pressure ulcer of sacral region, stage 3: Secondary | ICD-10-CM | POA: Diagnosis not present

## 2020-11-13 DIAGNOSIS — R296 Repeated falls: Secondary | ICD-10-CM | POA: Diagnosis not present

## 2020-11-13 DIAGNOSIS — I10 Essential (primary) hypertension: Secondary | ICD-10-CM | POA: Diagnosis not present

## 2020-11-13 DIAGNOSIS — E46 Unspecified protein-calorie malnutrition: Secondary | ICD-10-CM | POA: Diagnosis not present

## 2020-11-13 DIAGNOSIS — G35 Multiple sclerosis: Secondary | ICD-10-CM | POA: Diagnosis not present

## 2020-11-13 DIAGNOSIS — Z681 Body mass index (BMI) 19 or less, adult: Secondary | ICD-10-CM | POA: Diagnosis not present

## 2020-11-14 DIAGNOSIS — G47419 Narcolepsy without cataplexy: Secondary | ICD-10-CM | POA: Diagnosis not present

## 2020-11-14 DIAGNOSIS — J449 Chronic obstructive pulmonary disease, unspecified: Secondary | ICD-10-CM | POA: Diagnosis not present

## 2020-11-14 DIAGNOSIS — E46 Unspecified protein-calorie malnutrition: Secondary | ICD-10-CM | POA: Diagnosis not present

## 2020-11-14 DIAGNOSIS — G35 Multiple sclerosis: Secondary | ICD-10-CM | POA: Diagnosis not present

## 2020-11-14 DIAGNOSIS — Z681 Body mass index (BMI) 19 or less, adult: Secondary | ICD-10-CM | POA: Diagnosis not present

## 2020-11-14 DIAGNOSIS — I10 Essential (primary) hypertension: Secondary | ICD-10-CM | POA: Diagnosis not present

## 2020-11-14 DIAGNOSIS — D649 Anemia, unspecified: Secondary | ICD-10-CM | POA: Diagnosis not present

## 2020-11-14 DIAGNOSIS — L89153 Pressure ulcer of sacral region, stage 3: Secondary | ICD-10-CM | POA: Diagnosis not present

## 2020-11-14 DIAGNOSIS — E785 Hyperlipidemia, unspecified: Secondary | ICD-10-CM | POA: Diagnosis not present

## 2020-11-14 DIAGNOSIS — Z8614 Personal history of Methicillin resistant Staphylococcus aureus infection: Secondary | ICD-10-CM | POA: Diagnosis not present

## 2020-11-14 DIAGNOSIS — R296 Repeated falls: Secondary | ICD-10-CM | POA: Diagnosis not present

## 2020-11-15 DIAGNOSIS — Z681 Body mass index (BMI) 19 or less, adult: Secondary | ICD-10-CM | POA: Diagnosis not present

## 2020-11-15 DIAGNOSIS — J449 Chronic obstructive pulmonary disease, unspecified: Secondary | ICD-10-CM | POA: Diagnosis not present

## 2020-11-15 DIAGNOSIS — E46 Unspecified protein-calorie malnutrition: Secondary | ICD-10-CM | POA: Diagnosis not present

## 2020-11-15 DIAGNOSIS — G47419 Narcolepsy without cataplexy: Secondary | ICD-10-CM | POA: Diagnosis not present

## 2020-11-15 DIAGNOSIS — I10 Essential (primary) hypertension: Secondary | ICD-10-CM | POA: Diagnosis not present

## 2020-11-15 DIAGNOSIS — L89153 Pressure ulcer of sacral region, stage 3: Secondary | ICD-10-CM | POA: Diagnosis not present

## 2020-11-15 DIAGNOSIS — Z8614 Personal history of Methicillin resistant Staphylococcus aureus infection: Secondary | ICD-10-CM | POA: Diagnosis not present

## 2020-11-15 DIAGNOSIS — E785 Hyperlipidemia, unspecified: Secondary | ICD-10-CM | POA: Diagnosis not present

## 2020-11-15 DIAGNOSIS — D649 Anemia, unspecified: Secondary | ICD-10-CM | POA: Diagnosis not present

## 2020-11-15 DIAGNOSIS — R296 Repeated falls: Secondary | ICD-10-CM | POA: Diagnosis not present

## 2020-11-15 DIAGNOSIS — G35 Multiple sclerosis: Secondary | ICD-10-CM | POA: Diagnosis not present

## 2020-11-16 DIAGNOSIS — R296 Repeated falls: Secondary | ICD-10-CM | POA: Diagnosis not present

## 2020-11-16 DIAGNOSIS — Z8614 Personal history of Methicillin resistant Staphylococcus aureus infection: Secondary | ICD-10-CM | POA: Diagnosis not present

## 2020-11-16 DIAGNOSIS — L89153 Pressure ulcer of sacral region, stage 3: Secondary | ICD-10-CM | POA: Diagnosis not present

## 2020-11-16 DIAGNOSIS — G35 Multiple sclerosis: Secondary | ICD-10-CM | POA: Diagnosis not present

## 2020-11-16 DIAGNOSIS — J449 Chronic obstructive pulmonary disease, unspecified: Secondary | ICD-10-CM | POA: Diagnosis not present

## 2020-11-16 DIAGNOSIS — Z681 Body mass index (BMI) 19 or less, adult: Secondary | ICD-10-CM | POA: Diagnosis not present

## 2020-11-16 DIAGNOSIS — G47419 Narcolepsy without cataplexy: Secondary | ICD-10-CM | POA: Diagnosis not present

## 2020-11-16 DIAGNOSIS — E46 Unspecified protein-calorie malnutrition: Secondary | ICD-10-CM | POA: Diagnosis not present

## 2020-11-16 DIAGNOSIS — I10 Essential (primary) hypertension: Secondary | ICD-10-CM | POA: Diagnosis not present

## 2020-11-16 DIAGNOSIS — D649 Anemia, unspecified: Secondary | ICD-10-CM | POA: Diagnosis not present

## 2020-11-16 DIAGNOSIS — E785 Hyperlipidemia, unspecified: Secondary | ICD-10-CM | POA: Diagnosis not present

## 2020-11-17 DIAGNOSIS — G35 Multiple sclerosis: Secondary | ICD-10-CM | POA: Diagnosis not present

## 2020-11-17 DIAGNOSIS — E46 Unspecified protein-calorie malnutrition: Secondary | ICD-10-CM | POA: Diagnosis not present

## 2020-11-17 DIAGNOSIS — I10 Essential (primary) hypertension: Secondary | ICD-10-CM | POA: Diagnosis not present

## 2020-11-17 DIAGNOSIS — Z681 Body mass index (BMI) 19 or less, adult: Secondary | ICD-10-CM | POA: Diagnosis not present

## 2020-11-17 DIAGNOSIS — D649 Anemia, unspecified: Secondary | ICD-10-CM | POA: Diagnosis not present

## 2020-11-17 DIAGNOSIS — G47419 Narcolepsy without cataplexy: Secondary | ICD-10-CM | POA: Diagnosis not present

## 2020-11-17 DIAGNOSIS — J449 Chronic obstructive pulmonary disease, unspecified: Secondary | ICD-10-CM | POA: Diagnosis not present

## 2020-11-17 DIAGNOSIS — L89153 Pressure ulcer of sacral region, stage 3: Secondary | ICD-10-CM | POA: Diagnosis not present

## 2020-11-17 DIAGNOSIS — E785 Hyperlipidemia, unspecified: Secondary | ICD-10-CM | POA: Diagnosis not present

## 2020-11-17 DIAGNOSIS — R296 Repeated falls: Secondary | ICD-10-CM | POA: Diagnosis not present

## 2020-11-17 DIAGNOSIS — Z8614 Personal history of Methicillin resistant Staphylococcus aureus infection: Secondary | ICD-10-CM | POA: Diagnosis not present

## 2020-11-18 DIAGNOSIS — Z681 Body mass index (BMI) 19 or less, adult: Secondary | ICD-10-CM | POA: Diagnosis not present

## 2020-11-18 DIAGNOSIS — Z8614 Personal history of Methicillin resistant Staphylococcus aureus infection: Secondary | ICD-10-CM | POA: Diagnosis not present

## 2020-11-18 DIAGNOSIS — J449 Chronic obstructive pulmonary disease, unspecified: Secondary | ICD-10-CM | POA: Diagnosis not present

## 2020-11-18 DIAGNOSIS — R296 Repeated falls: Secondary | ICD-10-CM | POA: Diagnosis not present

## 2020-11-18 DIAGNOSIS — I10 Essential (primary) hypertension: Secondary | ICD-10-CM | POA: Diagnosis not present

## 2020-11-18 DIAGNOSIS — E46 Unspecified protein-calorie malnutrition: Secondary | ICD-10-CM | POA: Diagnosis not present

## 2020-11-18 DIAGNOSIS — L89153 Pressure ulcer of sacral region, stage 3: Secondary | ICD-10-CM | POA: Diagnosis not present

## 2020-11-18 DIAGNOSIS — D649 Anemia, unspecified: Secondary | ICD-10-CM | POA: Diagnosis not present

## 2020-11-18 DIAGNOSIS — G47419 Narcolepsy without cataplexy: Secondary | ICD-10-CM | POA: Diagnosis not present

## 2020-11-18 DIAGNOSIS — E785 Hyperlipidemia, unspecified: Secondary | ICD-10-CM | POA: Diagnosis not present

## 2020-11-18 DIAGNOSIS — G35 Multiple sclerosis: Secondary | ICD-10-CM | POA: Diagnosis not present

## 2020-11-19 DIAGNOSIS — R296 Repeated falls: Secondary | ICD-10-CM | POA: Diagnosis not present

## 2020-11-19 DIAGNOSIS — J449 Chronic obstructive pulmonary disease, unspecified: Secondary | ICD-10-CM | POA: Diagnosis not present

## 2020-11-19 DIAGNOSIS — D649 Anemia, unspecified: Secondary | ICD-10-CM | POA: Diagnosis not present

## 2020-11-19 DIAGNOSIS — Z8614 Personal history of Methicillin resistant Staphylococcus aureus infection: Secondary | ICD-10-CM | POA: Diagnosis not present

## 2020-11-19 DIAGNOSIS — I10 Essential (primary) hypertension: Secondary | ICD-10-CM | POA: Diagnosis not present

## 2020-11-19 DIAGNOSIS — L89153 Pressure ulcer of sacral region, stage 3: Secondary | ICD-10-CM | POA: Diagnosis not present

## 2020-11-19 DIAGNOSIS — Z681 Body mass index (BMI) 19 or less, adult: Secondary | ICD-10-CM | POA: Diagnosis not present

## 2020-11-19 DIAGNOSIS — E785 Hyperlipidemia, unspecified: Secondary | ICD-10-CM | POA: Diagnosis not present

## 2020-11-19 DIAGNOSIS — E46 Unspecified protein-calorie malnutrition: Secondary | ICD-10-CM | POA: Diagnosis not present

## 2020-11-19 DIAGNOSIS — G35 Multiple sclerosis: Secondary | ICD-10-CM | POA: Diagnosis not present

## 2020-11-19 DIAGNOSIS — G47419 Narcolepsy without cataplexy: Secondary | ICD-10-CM | POA: Diagnosis not present

## 2020-11-20 DIAGNOSIS — G47419 Narcolepsy without cataplexy: Secondary | ICD-10-CM | POA: Diagnosis not present

## 2020-11-20 DIAGNOSIS — E46 Unspecified protein-calorie malnutrition: Secondary | ICD-10-CM | POA: Diagnosis not present

## 2020-11-20 DIAGNOSIS — E785 Hyperlipidemia, unspecified: Secondary | ICD-10-CM | POA: Diagnosis not present

## 2020-11-20 DIAGNOSIS — D649 Anemia, unspecified: Secondary | ICD-10-CM | POA: Diagnosis not present

## 2020-11-20 DIAGNOSIS — I10 Essential (primary) hypertension: Secondary | ICD-10-CM | POA: Diagnosis not present

## 2020-11-20 DIAGNOSIS — Z8614 Personal history of Methicillin resistant Staphylococcus aureus infection: Secondary | ICD-10-CM | POA: Diagnosis not present

## 2020-11-20 DIAGNOSIS — R296 Repeated falls: Secondary | ICD-10-CM | POA: Diagnosis not present

## 2020-11-20 DIAGNOSIS — Z681 Body mass index (BMI) 19 or less, adult: Secondary | ICD-10-CM | POA: Diagnosis not present

## 2020-11-20 DIAGNOSIS — L89153 Pressure ulcer of sacral region, stage 3: Secondary | ICD-10-CM | POA: Diagnosis not present

## 2020-11-20 DIAGNOSIS — J449 Chronic obstructive pulmonary disease, unspecified: Secondary | ICD-10-CM | POA: Diagnosis not present

## 2020-11-20 DIAGNOSIS — G35 Multiple sclerosis: Secondary | ICD-10-CM | POA: Diagnosis not present

## 2020-11-21 DIAGNOSIS — R296 Repeated falls: Secondary | ICD-10-CM | POA: Diagnosis not present

## 2020-11-21 DIAGNOSIS — L89153 Pressure ulcer of sacral region, stage 3: Secondary | ICD-10-CM | POA: Diagnosis not present

## 2020-11-21 DIAGNOSIS — G35 Multiple sclerosis: Secondary | ICD-10-CM | POA: Diagnosis not present

## 2020-11-21 DIAGNOSIS — I10 Essential (primary) hypertension: Secondary | ICD-10-CM | POA: Diagnosis not present

## 2020-11-21 DIAGNOSIS — J449 Chronic obstructive pulmonary disease, unspecified: Secondary | ICD-10-CM | POA: Diagnosis not present

## 2020-11-21 DIAGNOSIS — G47419 Narcolepsy without cataplexy: Secondary | ICD-10-CM | POA: Diagnosis not present

## 2020-11-21 DIAGNOSIS — Z8614 Personal history of Methicillin resistant Staphylococcus aureus infection: Secondary | ICD-10-CM | POA: Diagnosis not present

## 2020-11-21 DIAGNOSIS — E785 Hyperlipidemia, unspecified: Secondary | ICD-10-CM | POA: Diagnosis not present

## 2020-11-21 DIAGNOSIS — D649 Anemia, unspecified: Secondary | ICD-10-CM | POA: Diagnosis not present

## 2020-11-21 DIAGNOSIS — E46 Unspecified protein-calorie malnutrition: Secondary | ICD-10-CM | POA: Diagnosis not present

## 2020-11-21 DIAGNOSIS — Z681 Body mass index (BMI) 19 or less, adult: Secondary | ICD-10-CM | POA: Diagnosis not present

## 2020-11-22 DIAGNOSIS — E46 Unspecified protein-calorie malnutrition: Secondary | ICD-10-CM | POA: Diagnosis not present

## 2020-11-22 DIAGNOSIS — Z8614 Personal history of Methicillin resistant Staphylococcus aureus infection: Secondary | ICD-10-CM | POA: Diagnosis not present

## 2020-11-22 DIAGNOSIS — D649 Anemia, unspecified: Secondary | ICD-10-CM | POA: Diagnosis not present

## 2020-11-22 DIAGNOSIS — G47419 Narcolepsy without cataplexy: Secondary | ICD-10-CM | POA: Diagnosis not present

## 2020-11-22 DIAGNOSIS — L89153 Pressure ulcer of sacral region, stage 3: Secondary | ICD-10-CM | POA: Diagnosis not present

## 2020-11-22 DIAGNOSIS — G35 Multiple sclerosis: Secondary | ICD-10-CM | POA: Diagnosis not present

## 2020-11-22 DIAGNOSIS — J449 Chronic obstructive pulmonary disease, unspecified: Secondary | ICD-10-CM | POA: Diagnosis not present

## 2020-11-22 DIAGNOSIS — Z681 Body mass index (BMI) 19 or less, adult: Secondary | ICD-10-CM | POA: Diagnosis not present

## 2020-11-22 DIAGNOSIS — E785 Hyperlipidemia, unspecified: Secondary | ICD-10-CM | POA: Diagnosis not present

## 2020-11-22 DIAGNOSIS — I10 Essential (primary) hypertension: Secondary | ICD-10-CM | POA: Diagnosis not present

## 2020-11-22 DIAGNOSIS — R296 Repeated falls: Secondary | ICD-10-CM | POA: Diagnosis not present

## 2020-11-23 DIAGNOSIS — Z8614 Personal history of Methicillin resistant Staphylococcus aureus infection: Secondary | ICD-10-CM | POA: Diagnosis not present

## 2020-11-23 DIAGNOSIS — J449 Chronic obstructive pulmonary disease, unspecified: Secondary | ICD-10-CM | POA: Diagnosis not present

## 2020-11-23 DIAGNOSIS — L89153 Pressure ulcer of sacral region, stage 3: Secondary | ICD-10-CM | POA: Diagnosis not present

## 2020-11-23 DIAGNOSIS — E46 Unspecified protein-calorie malnutrition: Secondary | ICD-10-CM | POA: Diagnosis not present

## 2020-11-23 DIAGNOSIS — G47419 Narcolepsy without cataplexy: Secondary | ICD-10-CM | POA: Diagnosis not present

## 2020-11-23 DIAGNOSIS — G35 Multiple sclerosis: Secondary | ICD-10-CM | POA: Diagnosis not present

## 2020-11-23 DIAGNOSIS — E785 Hyperlipidemia, unspecified: Secondary | ICD-10-CM | POA: Diagnosis not present

## 2020-11-23 DIAGNOSIS — D649 Anemia, unspecified: Secondary | ICD-10-CM | POA: Diagnosis not present

## 2020-11-23 DIAGNOSIS — Z681 Body mass index (BMI) 19 or less, adult: Secondary | ICD-10-CM | POA: Diagnosis not present

## 2020-11-23 DIAGNOSIS — R296 Repeated falls: Secondary | ICD-10-CM | POA: Diagnosis not present

## 2020-11-23 DIAGNOSIS — I10 Essential (primary) hypertension: Secondary | ICD-10-CM | POA: Diagnosis not present

## 2020-11-24 DIAGNOSIS — J449 Chronic obstructive pulmonary disease, unspecified: Secondary | ICD-10-CM | POA: Diagnosis not present

## 2020-11-24 DIAGNOSIS — I10 Essential (primary) hypertension: Secondary | ICD-10-CM | POA: Diagnosis not present

## 2020-11-24 DIAGNOSIS — Z681 Body mass index (BMI) 19 or less, adult: Secondary | ICD-10-CM | POA: Diagnosis not present

## 2020-11-24 DIAGNOSIS — D649 Anemia, unspecified: Secondary | ICD-10-CM | POA: Diagnosis not present

## 2020-11-24 DIAGNOSIS — R296 Repeated falls: Secondary | ICD-10-CM | POA: Diagnosis not present

## 2020-11-24 DIAGNOSIS — G35 Multiple sclerosis: Secondary | ICD-10-CM | POA: Diagnosis not present

## 2020-11-24 DIAGNOSIS — L89153 Pressure ulcer of sacral region, stage 3: Secondary | ICD-10-CM | POA: Diagnosis not present

## 2020-11-24 DIAGNOSIS — E785 Hyperlipidemia, unspecified: Secondary | ICD-10-CM | POA: Diagnosis not present

## 2020-11-24 DIAGNOSIS — G47419 Narcolepsy without cataplexy: Secondary | ICD-10-CM | POA: Diagnosis not present

## 2020-11-24 DIAGNOSIS — Z8614 Personal history of Methicillin resistant Staphylococcus aureus infection: Secondary | ICD-10-CM | POA: Diagnosis not present

## 2020-11-24 DIAGNOSIS — E46 Unspecified protein-calorie malnutrition: Secondary | ICD-10-CM | POA: Diagnosis not present

## 2020-11-25 DIAGNOSIS — G35 Multiple sclerosis: Secondary | ICD-10-CM | POA: Diagnosis not present

## 2020-11-25 DIAGNOSIS — L89153 Pressure ulcer of sacral region, stage 3: Secondary | ICD-10-CM | POA: Diagnosis not present

## 2020-11-25 DIAGNOSIS — E46 Unspecified protein-calorie malnutrition: Secondary | ICD-10-CM | POA: Diagnosis not present

## 2020-11-25 DIAGNOSIS — D649 Anemia, unspecified: Secondary | ICD-10-CM | POA: Diagnosis not present

## 2020-11-25 DIAGNOSIS — I10 Essential (primary) hypertension: Secondary | ICD-10-CM | POA: Diagnosis not present

## 2020-11-25 DIAGNOSIS — Z681 Body mass index (BMI) 19 or less, adult: Secondary | ICD-10-CM | POA: Diagnosis not present

## 2020-11-25 DIAGNOSIS — E785 Hyperlipidemia, unspecified: Secondary | ICD-10-CM | POA: Diagnosis not present

## 2020-11-25 DIAGNOSIS — J449 Chronic obstructive pulmonary disease, unspecified: Secondary | ICD-10-CM | POA: Diagnosis not present

## 2020-11-25 DIAGNOSIS — G47419 Narcolepsy without cataplexy: Secondary | ICD-10-CM | POA: Diagnosis not present

## 2020-11-25 DIAGNOSIS — R296 Repeated falls: Secondary | ICD-10-CM | POA: Diagnosis not present

## 2020-11-25 DIAGNOSIS — Z8614 Personal history of Methicillin resistant Staphylococcus aureus infection: Secondary | ICD-10-CM | POA: Diagnosis not present

## 2020-11-26 DIAGNOSIS — R296 Repeated falls: Secondary | ICD-10-CM | POA: Diagnosis not present

## 2020-11-26 DIAGNOSIS — G35 Multiple sclerosis: Secondary | ICD-10-CM | POA: Diagnosis not present

## 2020-11-26 DIAGNOSIS — Z8614 Personal history of Methicillin resistant Staphylococcus aureus infection: Secondary | ICD-10-CM | POA: Diagnosis not present

## 2020-11-26 DIAGNOSIS — J449 Chronic obstructive pulmonary disease, unspecified: Secondary | ICD-10-CM | POA: Diagnosis not present

## 2020-11-26 DIAGNOSIS — E46 Unspecified protein-calorie malnutrition: Secondary | ICD-10-CM | POA: Diagnosis not present

## 2020-11-26 DIAGNOSIS — I10 Essential (primary) hypertension: Secondary | ICD-10-CM | POA: Diagnosis not present

## 2020-11-26 DIAGNOSIS — E785 Hyperlipidemia, unspecified: Secondary | ICD-10-CM | POA: Diagnosis not present

## 2020-11-26 DIAGNOSIS — L89153 Pressure ulcer of sacral region, stage 3: Secondary | ICD-10-CM | POA: Diagnosis not present

## 2020-11-26 DIAGNOSIS — D649 Anemia, unspecified: Secondary | ICD-10-CM | POA: Diagnosis not present

## 2020-11-26 DIAGNOSIS — G47419 Narcolepsy without cataplexy: Secondary | ICD-10-CM | POA: Diagnosis not present

## 2020-11-26 DIAGNOSIS — Z681 Body mass index (BMI) 19 or less, adult: Secondary | ICD-10-CM | POA: Diagnosis not present

## 2020-11-26 IMAGING — CR DG CERVICAL SPINE COMPLETE 4+V
6 series · 6 of 6 positions shown · non-contrast
Comparison: Procedural fluoroscopy.

CLINICAL DATA: ACDF C3-C4

EXAM:
CERVICAL SPINE - COMPLETE 4+ VIEW

[AP (1 of 6)]
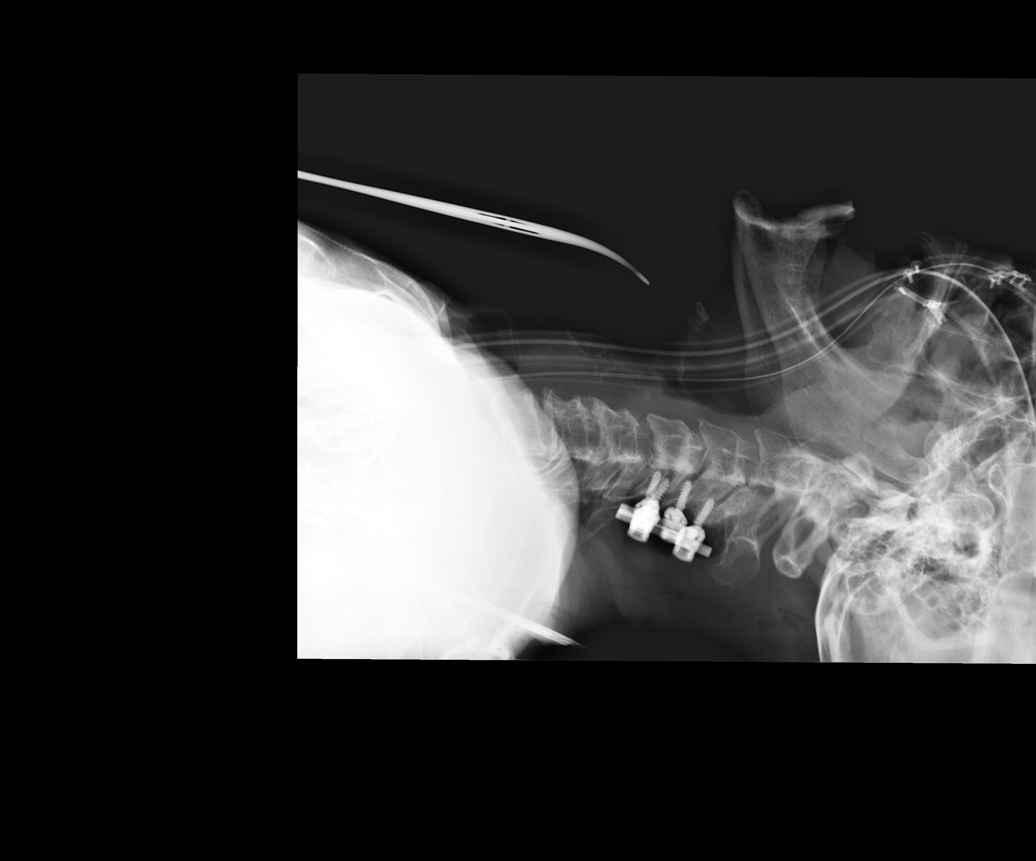

[AP (2 of 6)]
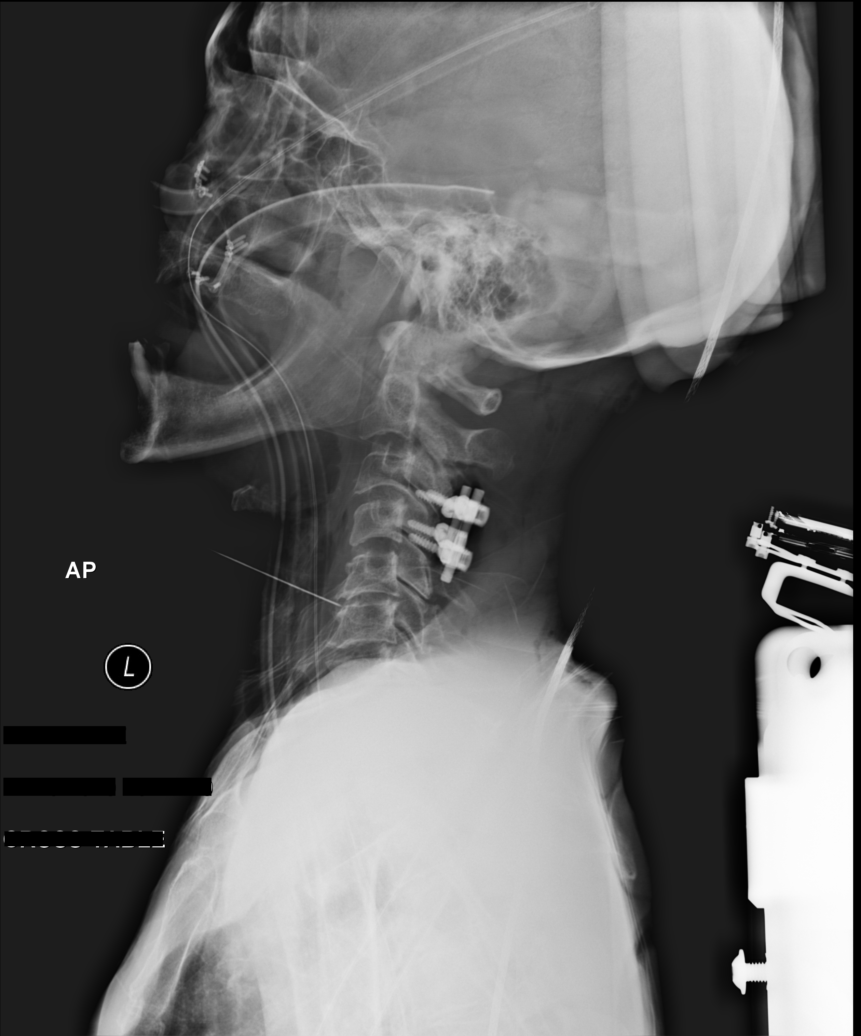

[AP (3 of 6)]
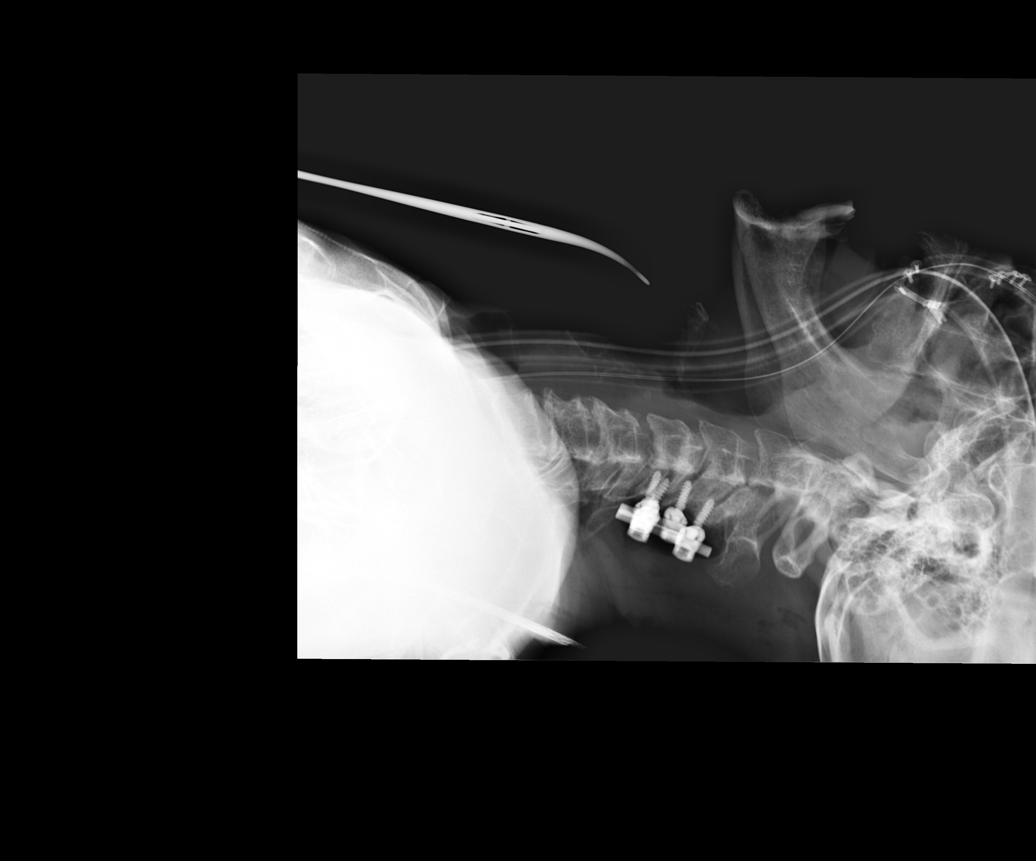

[AP (4 of 6)]
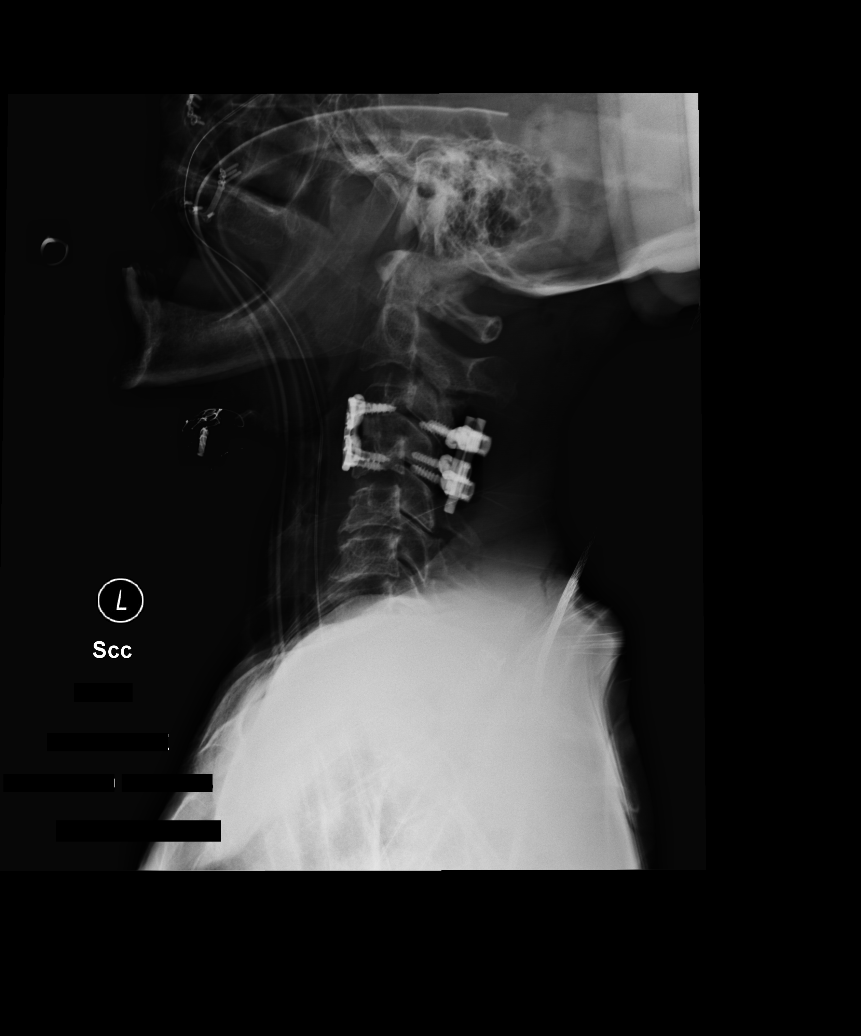

[AP (5 of 6)]
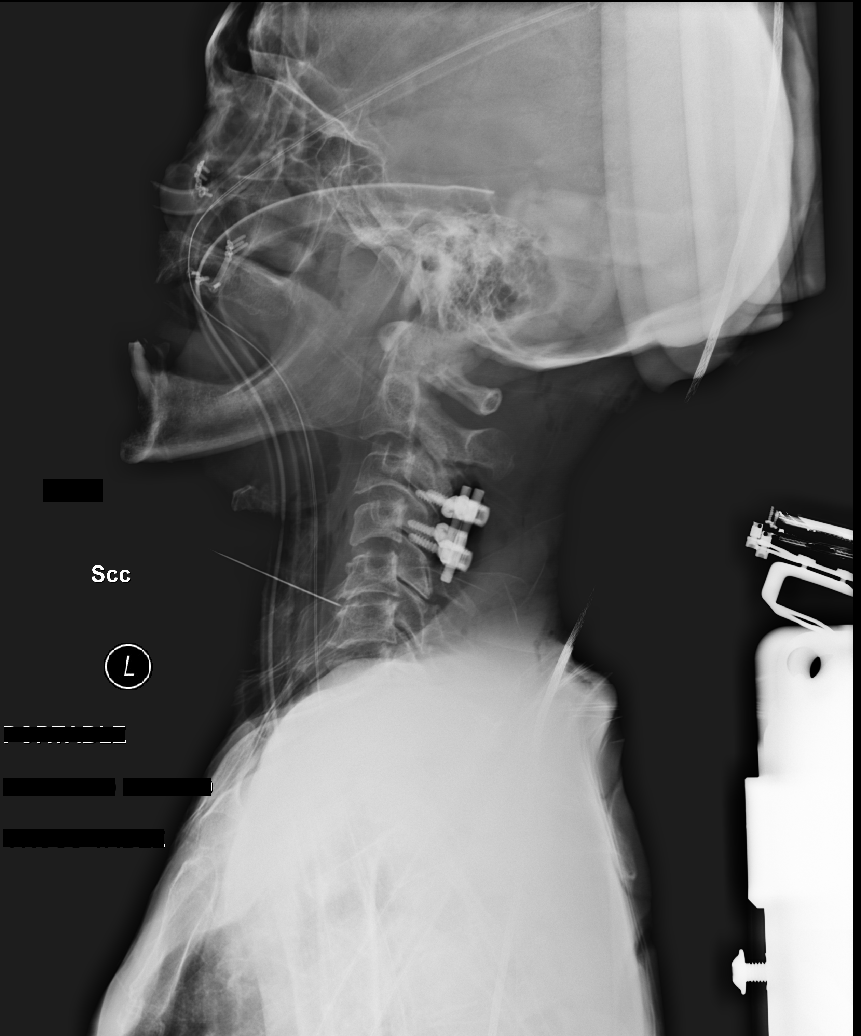

[AP (6 of 6)]
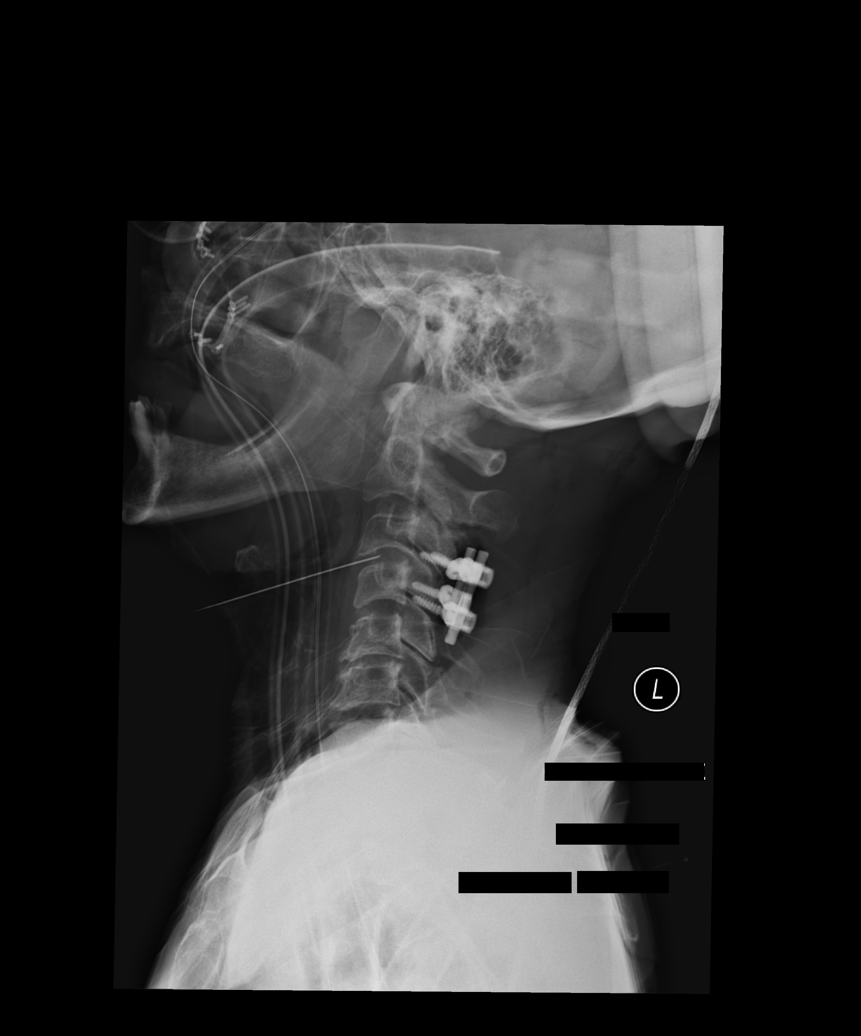

[6 of 6 positions shown; findings below may reference images not displayed]

FINDINGS: Multiple cross-table lateral spot views obtained in the operating
room. Patient has prior C3-C4 posterior fusion. Images obtained
surgical instruments localizing anteriorly. Subsequent anterior
C3-C4 fusion.
IMPRESSION: Cross-table lateral spot views obtained in the operating room during
surgical localization and anterior C3-C4 fusion.

## 2020-11-26 IMAGING — RF DG C-ARM 1-60 MIN
1 series · 1 of 1 positions shown · non-contrast
Comparison: 01/18/2019 lumbar spine MRI

CLINICAL DATA: Posterior cervical laminectomy, removal of synovial
cyst and posterior cervical fusion

EXAM:
DG CERVICAL SPINE - 1 VIEW; DG C-ARM 1-60 MIN

[Series 1: run · 1 of 1 slices shown]
[im 1/1]
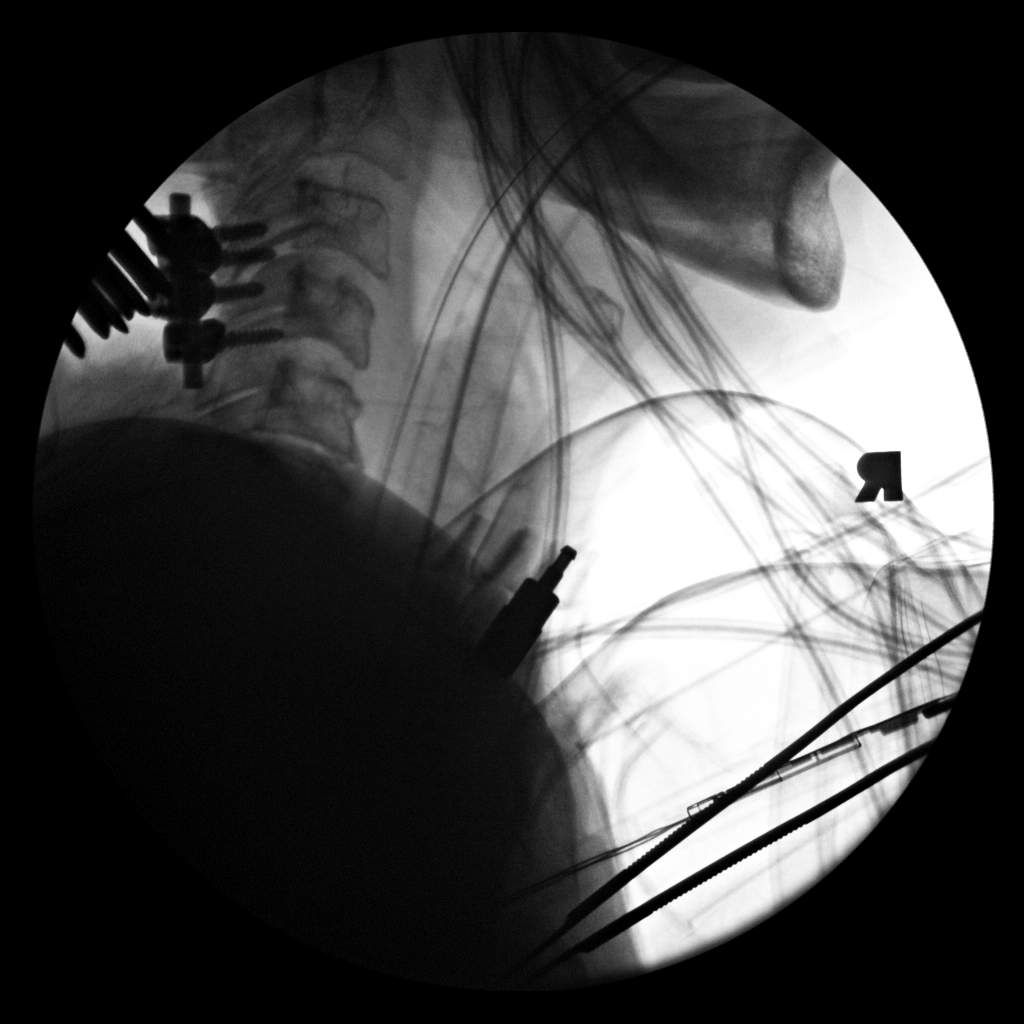

[1 of 1 positions shown; findings below may reference images not displayed]

FLUOROSCOPY TIME:  Fluoroscopy Time:  0 minutes 14 seconds

Number of Acquired Spot Images: 1
FINDINGS: Nondiagnostic single spot fluoroscopic lateral intraoperative
cervical spine radiograph demonstrates postsurgical changes from
bilateral posterior spinal fusion at C3-4.
IMPRESSION: Intraoperative fluoroscopic guidance for posterior spinal fusion.

## 2020-11-27 DIAGNOSIS — R296 Repeated falls: Secondary | ICD-10-CM | POA: Diagnosis not present

## 2020-11-27 DIAGNOSIS — D649 Anemia, unspecified: Secondary | ICD-10-CM | POA: Diagnosis not present

## 2020-11-27 DIAGNOSIS — E785 Hyperlipidemia, unspecified: Secondary | ICD-10-CM | POA: Diagnosis not present

## 2020-11-27 DIAGNOSIS — E46 Unspecified protein-calorie malnutrition: Secondary | ICD-10-CM | POA: Diagnosis not present

## 2020-11-27 DIAGNOSIS — G47419 Narcolepsy without cataplexy: Secondary | ICD-10-CM | POA: Diagnosis not present

## 2020-11-27 DIAGNOSIS — L89153 Pressure ulcer of sacral region, stage 3: Secondary | ICD-10-CM | POA: Diagnosis not present

## 2020-11-27 DIAGNOSIS — I10 Essential (primary) hypertension: Secondary | ICD-10-CM | POA: Diagnosis not present

## 2020-11-27 DIAGNOSIS — Z681 Body mass index (BMI) 19 or less, adult: Secondary | ICD-10-CM | POA: Diagnosis not present

## 2020-11-27 DIAGNOSIS — Z8614 Personal history of Methicillin resistant Staphylococcus aureus infection: Secondary | ICD-10-CM | POA: Diagnosis not present

## 2020-11-27 DIAGNOSIS — G35 Multiple sclerosis: Secondary | ICD-10-CM | POA: Diagnosis not present

## 2020-11-27 DIAGNOSIS — J449 Chronic obstructive pulmonary disease, unspecified: Secondary | ICD-10-CM | POA: Diagnosis not present

## 2020-11-28 DIAGNOSIS — G47419 Narcolepsy without cataplexy: Secondary | ICD-10-CM | POA: Diagnosis not present

## 2020-11-28 DIAGNOSIS — R296 Repeated falls: Secondary | ICD-10-CM | POA: Diagnosis not present

## 2020-11-28 DIAGNOSIS — I10 Essential (primary) hypertension: Secondary | ICD-10-CM | POA: Diagnosis not present

## 2020-11-28 DIAGNOSIS — L89153 Pressure ulcer of sacral region, stage 3: Secondary | ICD-10-CM | POA: Diagnosis not present

## 2020-11-28 DIAGNOSIS — J449 Chronic obstructive pulmonary disease, unspecified: Secondary | ICD-10-CM | POA: Diagnosis not present

## 2020-11-28 DIAGNOSIS — Z8614 Personal history of Methicillin resistant Staphylococcus aureus infection: Secondary | ICD-10-CM | POA: Diagnosis not present

## 2020-11-28 DIAGNOSIS — E46 Unspecified protein-calorie malnutrition: Secondary | ICD-10-CM | POA: Diagnosis not present

## 2020-11-28 DIAGNOSIS — D649 Anemia, unspecified: Secondary | ICD-10-CM | POA: Diagnosis not present

## 2020-11-28 DIAGNOSIS — G35 Multiple sclerosis: Secondary | ICD-10-CM | POA: Diagnosis not present

## 2020-11-28 DIAGNOSIS — E785 Hyperlipidemia, unspecified: Secondary | ICD-10-CM | POA: Diagnosis not present

## 2020-11-28 DIAGNOSIS — Z681 Body mass index (BMI) 19 or less, adult: Secondary | ICD-10-CM | POA: Diagnosis not present

## 2020-11-29 DIAGNOSIS — G35 Multiple sclerosis: Secondary | ICD-10-CM | POA: Diagnosis not present

## 2020-11-29 DIAGNOSIS — Z8614 Personal history of Methicillin resistant Staphylococcus aureus infection: Secondary | ICD-10-CM | POA: Diagnosis not present

## 2020-11-29 DIAGNOSIS — J449 Chronic obstructive pulmonary disease, unspecified: Secondary | ICD-10-CM | POA: Diagnosis not present

## 2020-11-29 DIAGNOSIS — G47419 Narcolepsy without cataplexy: Secondary | ICD-10-CM | POA: Diagnosis not present

## 2020-11-29 DIAGNOSIS — D649 Anemia, unspecified: Secondary | ICD-10-CM | POA: Diagnosis not present

## 2020-11-29 DIAGNOSIS — E785 Hyperlipidemia, unspecified: Secondary | ICD-10-CM | POA: Diagnosis not present

## 2020-11-29 DIAGNOSIS — I10 Essential (primary) hypertension: Secondary | ICD-10-CM | POA: Diagnosis not present

## 2020-11-29 DIAGNOSIS — R296 Repeated falls: Secondary | ICD-10-CM | POA: Diagnosis not present

## 2020-11-29 DIAGNOSIS — Z681 Body mass index (BMI) 19 or less, adult: Secondary | ICD-10-CM | POA: Diagnosis not present

## 2020-11-29 DIAGNOSIS — L89153 Pressure ulcer of sacral region, stage 3: Secondary | ICD-10-CM | POA: Diagnosis not present

## 2020-11-29 DIAGNOSIS — E46 Unspecified protein-calorie malnutrition: Secondary | ICD-10-CM | POA: Diagnosis not present

## 2020-11-30 DIAGNOSIS — R296 Repeated falls: Secondary | ICD-10-CM | POA: Diagnosis not present

## 2020-11-30 DIAGNOSIS — G35 Multiple sclerosis: Secondary | ICD-10-CM | POA: Diagnosis not present

## 2020-11-30 DIAGNOSIS — D649 Anemia, unspecified: Secondary | ICD-10-CM | POA: Diagnosis not present

## 2020-11-30 DIAGNOSIS — E46 Unspecified protein-calorie malnutrition: Secondary | ICD-10-CM | POA: Diagnosis not present

## 2020-11-30 DIAGNOSIS — Z8614 Personal history of Methicillin resistant Staphylococcus aureus infection: Secondary | ICD-10-CM | POA: Diagnosis not present

## 2020-11-30 DIAGNOSIS — E785 Hyperlipidemia, unspecified: Secondary | ICD-10-CM | POA: Diagnosis not present

## 2020-11-30 DIAGNOSIS — J449 Chronic obstructive pulmonary disease, unspecified: Secondary | ICD-10-CM | POA: Diagnosis not present

## 2020-11-30 DIAGNOSIS — I10 Essential (primary) hypertension: Secondary | ICD-10-CM | POA: Diagnosis not present

## 2020-11-30 DIAGNOSIS — Z681 Body mass index (BMI) 19 or less, adult: Secondary | ICD-10-CM | POA: Diagnosis not present

## 2020-11-30 DIAGNOSIS — G47419 Narcolepsy without cataplexy: Secondary | ICD-10-CM | POA: Diagnosis not present

## 2020-11-30 DIAGNOSIS — L89153 Pressure ulcer of sacral region, stage 3: Secondary | ICD-10-CM | POA: Diagnosis not present

## 2020-12-01 DIAGNOSIS — J449 Chronic obstructive pulmonary disease, unspecified: Secondary | ICD-10-CM | POA: Diagnosis not present

## 2020-12-01 DIAGNOSIS — G47419 Narcolepsy without cataplexy: Secondary | ICD-10-CM | POA: Diagnosis not present

## 2020-12-01 DIAGNOSIS — L89153 Pressure ulcer of sacral region, stage 3: Secondary | ICD-10-CM | POA: Diagnosis not present

## 2020-12-01 DIAGNOSIS — I10 Essential (primary) hypertension: Secondary | ICD-10-CM | POA: Diagnosis not present

## 2020-12-01 DIAGNOSIS — D649 Anemia, unspecified: Secondary | ICD-10-CM | POA: Diagnosis not present

## 2020-12-01 DIAGNOSIS — G35 Multiple sclerosis: Secondary | ICD-10-CM | POA: Diagnosis not present

## 2020-12-01 DIAGNOSIS — Z681 Body mass index (BMI) 19 or less, adult: Secondary | ICD-10-CM | POA: Diagnosis not present

## 2020-12-01 DIAGNOSIS — R296 Repeated falls: Secondary | ICD-10-CM | POA: Diagnosis not present

## 2020-12-01 DIAGNOSIS — E46 Unspecified protein-calorie malnutrition: Secondary | ICD-10-CM | POA: Diagnosis not present

## 2020-12-01 DIAGNOSIS — E785 Hyperlipidemia, unspecified: Secondary | ICD-10-CM | POA: Diagnosis not present

## 2020-12-01 DIAGNOSIS — Z8614 Personal history of Methicillin resistant Staphylococcus aureus infection: Secondary | ICD-10-CM | POA: Diagnosis not present

## 2020-12-02 DIAGNOSIS — E46 Unspecified protein-calorie malnutrition: Secondary | ICD-10-CM | POA: Diagnosis not present

## 2020-12-02 DIAGNOSIS — G35 Multiple sclerosis: Secondary | ICD-10-CM | POA: Diagnosis not present

## 2020-12-02 DIAGNOSIS — Z8614 Personal history of Methicillin resistant Staphylococcus aureus infection: Secondary | ICD-10-CM | POA: Diagnosis not present

## 2020-12-02 DIAGNOSIS — L89153 Pressure ulcer of sacral region, stage 3: Secondary | ICD-10-CM | POA: Diagnosis not present

## 2020-12-02 DIAGNOSIS — Z681 Body mass index (BMI) 19 or less, adult: Secondary | ICD-10-CM | POA: Diagnosis not present

## 2020-12-02 DIAGNOSIS — G47419 Narcolepsy without cataplexy: Secondary | ICD-10-CM | POA: Diagnosis not present

## 2020-12-02 DIAGNOSIS — I10 Essential (primary) hypertension: Secondary | ICD-10-CM | POA: Diagnosis not present

## 2020-12-02 DIAGNOSIS — E785 Hyperlipidemia, unspecified: Secondary | ICD-10-CM | POA: Diagnosis not present

## 2020-12-02 DIAGNOSIS — R296 Repeated falls: Secondary | ICD-10-CM | POA: Diagnosis not present

## 2020-12-02 DIAGNOSIS — J449 Chronic obstructive pulmonary disease, unspecified: Secondary | ICD-10-CM | POA: Diagnosis not present

## 2020-12-02 DIAGNOSIS — D649 Anemia, unspecified: Secondary | ICD-10-CM | POA: Diagnosis not present

## 2020-12-03 DIAGNOSIS — G35 Multiple sclerosis: Secondary | ICD-10-CM | POA: Diagnosis not present

## 2020-12-03 DIAGNOSIS — D649 Anemia, unspecified: Secondary | ICD-10-CM | POA: Diagnosis not present

## 2020-12-03 DIAGNOSIS — Z681 Body mass index (BMI) 19 or less, adult: Secondary | ICD-10-CM | POA: Diagnosis not present

## 2020-12-03 DIAGNOSIS — Z8614 Personal history of Methicillin resistant Staphylococcus aureus infection: Secondary | ICD-10-CM | POA: Diagnosis not present

## 2020-12-03 DIAGNOSIS — E46 Unspecified protein-calorie malnutrition: Secondary | ICD-10-CM | POA: Diagnosis not present

## 2020-12-03 DIAGNOSIS — I10 Essential (primary) hypertension: Secondary | ICD-10-CM | POA: Diagnosis not present

## 2020-12-03 DIAGNOSIS — L89153 Pressure ulcer of sacral region, stage 3: Secondary | ICD-10-CM | POA: Diagnosis not present

## 2020-12-03 DIAGNOSIS — G47419 Narcolepsy without cataplexy: Secondary | ICD-10-CM | POA: Diagnosis not present

## 2020-12-03 DIAGNOSIS — J449 Chronic obstructive pulmonary disease, unspecified: Secondary | ICD-10-CM | POA: Diagnosis not present

## 2020-12-03 DIAGNOSIS — R296 Repeated falls: Secondary | ICD-10-CM | POA: Diagnosis not present

## 2020-12-03 DIAGNOSIS — E785 Hyperlipidemia, unspecified: Secondary | ICD-10-CM | POA: Diagnosis not present

## 2020-12-04 DIAGNOSIS — G47419 Narcolepsy without cataplexy: Secondary | ICD-10-CM | POA: Diagnosis not present

## 2020-12-04 DIAGNOSIS — E46 Unspecified protein-calorie malnutrition: Secondary | ICD-10-CM | POA: Diagnosis not present

## 2020-12-04 DIAGNOSIS — L89153 Pressure ulcer of sacral region, stage 3: Secondary | ICD-10-CM | POA: Diagnosis not present

## 2020-12-04 DIAGNOSIS — G35 Multiple sclerosis: Secondary | ICD-10-CM | POA: Diagnosis not present

## 2020-12-04 DIAGNOSIS — Z681 Body mass index (BMI) 19 or less, adult: Secondary | ICD-10-CM | POA: Diagnosis not present

## 2020-12-04 DIAGNOSIS — R296 Repeated falls: Secondary | ICD-10-CM | POA: Diagnosis not present

## 2020-12-04 DIAGNOSIS — J449 Chronic obstructive pulmonary disease, unspecified: Secondary | ICD-10-CM | POA: Diagnosis not present

## 2020-12-04 DIAGNOSIS — I10 Essential (primary) hypertension: Secondary | ICD-10-CM | POA: Diagnosis not present

## 2020-12-04 DIAGNOSIS — E785 Hyperlipidemia, unspecified: Secondary | ICD-10-CM | POA: Diagnosis not present

## 2020-12-04 DIAGNOSIS — D649 Anemia, unspecified: Secondary | ICD-10-CM | POA: Diagnosis not present

## 2020-12-04 DIAGNOSIS — Z8614 Personal history of Methicillin resistant Staphylococcus aureus infection: Secondary | ICD-10-CM | POA: Diagnosis not present

## 2020-12-05 DIAGNOSIS — L89153 Pressure ulcer of sacral region, stage 3: Secondary | ICD-10-CM | POA: Diagnosis not present

## 2020-12-05 DIAGNOSIS — I10 Essential (primary) hypertension: Secondary | ICD-10-CM | POA: Diagnosis not present

## 2020-12-05 DIAGNOSIS — E785 Hyperlipidemia, unspecified: Secondary | ICD-10-CM | POA: Diagnosis not present

## 2020-12-05 DIAGNOSIS — Z681 Body mass index (BMI) 19 or less, adult: Secondary | ICD-10-CM | POA: Diagnosis not present

## 2020-12-05 DIAGNOSIS — G35 Multiple sclerosis: Secondary | ICD-10-CM | POA: Diagnosis not present

## 2020-12-05 DIAGNOSIS — G47419 Narcolepsy without cataplexy: Secondary | ICD-10-CM | POA: Diagnosis not present

## 2020-12-05 DIAGNOSIS — D649 Anemia, unspecified: Secondary | ICD-10-CM | POA: Diagnosis not present

## 2020-12-05 DIAGNOSIS — J449 Chronic obstructive pulmonary disease, unspecified: Secondary | ICD-10-CM | POA: Diagnosis not present

## 2020-12-05 DIAGNOSIS — R296 Repeated falls: Secondary | ICD-10-CM | POA: Diagnosis not present

## 2020-12-05 DIAGNOSIS — Z8614 Personal history of Methicillin resistant Staphylococcus aureus infection: Secondary | ICD-10-CM | POA: Diagnosis not present

## 2020-12-05 DIAGNOSIS — E46 Unspecified protein-calorie malnutrition: Secondary | ICD-10-CM | POA: Diagnosis not present

## 2020-12-06 DIAGNOSIS — E785 Hyperlipidemia, unspecified: Secondary | ICD-10-CM | POA: Diagnosis not present

## 2020-12-06 DIAGNOSIS — J449 Chronic obstructive pulmonary disease, unspecified: Secondary | ICD-10-CM | POA: Diagnosis not present

## 2020-12-06 DIAGNOSIS — L89153 Pressure ulcer of sacral region, stage 3: Secondary | ICD-10-CM | POA: Diagnosis not present

## 2020-12-06 DIAGNOSIS — G35 Multiple sclerosis: Secondary | ICD-10-CM | POA: Diagnosis not present

## 2020-12-06 DIAGNOSIS — R296 Repeated falls: Secondary | ICD-10-CM | POA: Diagnosis not present

## 2020-12-06 DIAGNOSIS — D649 Anemia, unspecified: Secondary | ICD-10-CM | POA: Diagnosis not present

## 2020-12-06 DIAGNOSIS — G47419 Narcolepsy without cataplexy: Secondary | ICD-10-CM | POA: Diagnosis not present

## 2020-12-06 DIAGNOSIS — E46 Unspecified protein-calorie malnutrition: Secondary | ICD-10-CM | POA: Diagnosis not present

## 2020-12-06 DIAGNOSIS — Z681 Body mass index (BMI) 19 or less, adult: Secondary | ICD-10-CM | POA: Diagnosis not present

## 2020-12-06 DIAGNOSIS — Z8614 Personal history of Methicillin resistant Staphylococcus aureus infection: Secondary | ICD-10-CM | POA: Diagnosis not present

## 2020-12-06 DIAGNOSIS — I10 Essential (primary) hypertension: Secondary | ICD-10-CM | POA: Diagnosis not present

## 2020-12-07 DIAGNOSIS — G35 Multiple sclerosis: Secondary | ICD-10-CM | POA: Diagnosis not present

## 2020-12-07 DIAGNOSIS — R296 Repeated falls: Secondary | ICD-10-CM | POA: Diagnosis not present

## 2020-12-07 DIAGNOSIS — Z681 Body mass index (BMI) 19 or less, adult: Secondary | ICD-10-CM | POA: Diagnosis not present

## 2020-12-07 DIAGNOSIS — Z8614 Personal history of Methicillin resistant Staphylococcus aureus infection: Secondary | ICD-10-CM | POA: Diagnosis not present

## 2020-12-07 DIAGNOSIS — E46 Unspecified protein-calorie malnutrition: Secondary | ICD-10-CM | POA: Diagnosis not present

## 2020-12-07 DIAGNOSIS — L89153 Pressure ulcer of sacral region, stage 3: Secondary | ICD-10-CM | POA: Diagnosis not present

## 2020-12-07 DIAGNOSIS — G47419 Narcolepsy without cataplexy: Secondary | ICD-10-CM | POA: Diagnosis not present

## 2020-12-07 DIAGNOSIS — D649 Anemia, unspecified: Secondary | ICD-10-CM | POA: Diagnosis not present

## 2020-12-07 DIAGNOSIS — I10 Essential (primary) hypertension: Secondary | ICD-10-CM | POA: Diagnosis not present

## 2020-12-07 DIAGNOSIS — E785 Hyperlipidemia, unspecified: Secondary | ICD-10-CM | POA: Diagnosis not present

## 2020-12-07 DIAGNOSIS — J449 Chronic obstructive pulmonary disease, unspecified: Secondary | ICD-10-CM | POA: Diagnosis not present

## 2020-12-08 DIAGNOSIS — E785 Hyperlipidemia, unspecified: Secondary | ICD-10-CM | POA: Diagnosis not present

## 2020-12-08 DIAGNOSIS — Z681 Body mass index (BMI) 19 or less, adult: Secondary | ICD-10-CM | POA: Diagnosis not present

## 2020-12-08 DIAGNOSIS — Z8614 Personal history of Methicillin resistant Staphylococcus aureus infection: Secondary | ICD-10-CM | POA: Diagnosis not present

## 2020-12-08 DIAGNOSIS — R296 Repeated falls: Secondary | ICD-10-CM | POA: Diagnosis not present

## 2020-12-08 DIAGNOSIS — G47419 Narcolepsy without cataplexy: Secondary | ICD-10-CM | POA: Diagnosis not present

## 2020-12-08 DIAGNOSIS — I10 Essential (primary) hypertension: Secondary | ICD-10-CM | POA: Diagnosis not present

## 2020-12-08 DIAGNOSIS — J449 Chronic obstructive pulmonary disease, unspecified: Secondary | ICD-10-CM | POA: Diagnosis not present

## 2020-12-08 DIAGNOSIS — E46 Unspecified protein-calorie malnutrition: Secondary | ICD-10-CM | POA: Diagnosis not present

## 2020-12-08 DIAGNOSIS — L89153 Pressure ulcer of sacral region, stage 3: Secondary | ICD-10-CM | POA: Diagnosis not present

## 2020-12-08 DIAGNOSIS — G35 Multiple sclerosis: Secondary | ICD-10-CM | POA: Diagnosis not present

## 2020-12-08 DIAGNOSIS — D649 Anemia, unspecified: Secondary | ICD-10-CM | POA: Diagnosis not present

## 2020-12-09 DIAGNOSIS — E46 Unspecified protein-calorie malnutrition: Secondary | ICD-10-CM | POA: Diagnosis not present

## 2020-12-09 DIAGNOSIS — I10 Essential (primary) hypertension: Secondary | ICD-10-CM | POA: Diagnosis not present

## 2020-12-09 DIAGNOSIS — G35 Multiple sclerosis: Secondary | ICD-10-CM | POA: Diagnosis not present

## 2020-12-09 DIAGNOSIS — G47419 Narcolepsy without cataplexy: Secondary | ICD-10-CM | POA: Diagnosis not present

## 2020-12-09 DIAGNOSIS — E785 Hyperlipidemia, unspecified: Secondary | ICD-10-CM | POA: Diagnosis not present

## 2020-12-09 DIAGNOSIS — Z8614 Personal history of Methicillin resistant Staphylococcus aureus infection: Secondary | ICD-10-CM | POA: Diagnosis not present

## 2020-12-09 DIAGNOSIS — Z681 Body mass index (BMI) 19 or less, adult: Secondary | ICD-10-CM | POA: Diagnosis not present

## 2020-12-09 DIAGNOSIS — R296 Repeated falls: Secondary | ICD-10-CM | POA: Diagnosis not present

## 2020-12-09 DIAGNOSIS — D649 Anemia, unspecified: Secondary | ICD-10-CM | POA: Diagnosis not present

## 2020-12-09 DIAGNOSIS — J449 Chronic obstructive pulmonary disease, unspecified: Secondary | ICD-10-CM | POA: Diagnosis not present

## 2020-12-09 DIAGNOSIS — L89153 Pressure ulcer of sacral region, stage 3: Secondary | ICD-10-CM | POA: Diagnosis not present

## 2020-12-10 ENCOUNTER — Emergency Department (HOSPITAL_COMMUNITY): Payer: Medicaid Other

## 2020-12-10 ENCOUNTER — Encounter (HOSPITAL_COMMUNITY): Payer: Self-pay

## 2020-12-10 ENCOUNTER — Inpatient Hospital Stay (HOSPITAL_COMMUNITY): Payer: Medicaid Other

## 2020-12-10 ENCOUNTER — Other Ambulatory Visit: Payer: Self-pay

## 2020-12-10 ENCOUNTER — Inpatient Hospital Stay (HOSPITAL_COMMUNITY)
Admission: EM | Admit: 2020-12-10 | Discharge: 2020-12-12 | DRG: 539 | Disposition: A | Discharge status: Home / Self Care | Payer: Medicaid Other | Attending: Internal Medicine | Admitting: Internal Medicine

## 2020-12-10 DIAGNOSIS — M47816 Spondylosis without myelopathy or radiculopathy, lumbar region: Secondary | ICD-10-CM | POA: Diagnosis not present

## 2020-12-10 DIAGNOSIS — I959 Hypotension, unspecified: Secondary | ICD-10-CM | POA: Diagnosis not present

## 2020-12-10 DIAGNOSIS — Z833 Family history of diabetes mellitus: Secondary | ICD-10-CM

## 2020-12-10 DIAGNOSIS — G35 Multiple sclerosis: Secondary | ICD-10-CM | POA: Diagnosis present

## 2020-12-10 DIAGNOSIS — R531 Weakness: Secondary | ICD-10-CM | POA: Diagnosis not present

## 2020-12-10 DIAGNOSIS — J439 Emphysema, unspecified: Secondary | ICD-10-CM | POA: Diagnosis present

## 2020-12-10 DIAGNOSIS — R32 Unspecified urinary incontinence: Secondary | ICD-10-CM | POA: Diagnosis present

## 2020-12-10 DIAGNOSIS — Z79899 Other long term (current) drug therapy: Secondary | ICD-10-CM

## 2020-12-10 DIAGNOSIS — E279 Disorder of adrenal gland, unspecified: Secondary | ICD-10-CM | POA: Diagnosis present

## 2020-12-10 DIAGNOSIS — Z981 Arthrodesis status: Secondary | ICD-10-CM

## 2020-12-10 DIAGNOSIS — L89159 Pressure ulcer of sacral region, unspecified stage: Secondary | ICD-10-CM | POA: Diagnosis not present

## 2020-12-10 DIAGNOSIS — J449 Chronic obstructive pulmonary disease, unspecified: Secondary | ICD-10-CM | POA: Diagnosis present

## 2020-12-10 DIAGNOSIS — Z8614 Personal history of Methicillin resistant Staphylococcus aureus infection: Secondary | ICD-10-CM | POA: Diagnosis not present

## 2020-12-10 DIAGNOSIS — Z888 Allergy status to other drugs, medicaments and biological substances status: Secondary | ICD-10-CM | POA: Diagnosis not present

## 2020-12-10 DIAGNOSIS — Z885 Allergy status to narcotic agent status: Secondary | ICD-10-CM | POA: Diagnosis not present

## 2020-12-10 DIAGNOSIS — L89150 Pressure ulcer of sacral region, unstageable: Secondary | ICD-10-CM

## 2020-12-10 DIAGNOSIS — E46 Unspecified protein-calorie malnutrition: Secondary | ICD-10-CM | POA: Diagnosis not present

## 2020-12-10 DIAGNOSIS — Z8673 Personal history of transient ischemic attack (TIA), and cerebral infarction without residual deficits: Secondary | ICD-10-CM | POA: Diagnosis not present

## 2020-12-10 DIAGNOSIS — G459 Transient cerebral ischemic attack, unspecified: Secondary | ICD-10-CM | POA: Diagnosis not present

## 2020-12-10 DIAGNOSIS — R296 Repeated falls: Secondary | ICD-10-CM | POA: Diagnosis not present

## 2020-12-10 DIAGNOSIS — E782 Mixed hyperlipidemia: Secondary | ICD-10-CM | POA: Diagnosis present

## 2020-12-10 DIAGNOSIS — M869 Osteomyelitis, unspecified: Secondary | ICD-10-CM | POA: Diagnosis not present

## 2020-12-10 DIAGNOSIS — Z681 Body mass index (BMI) 19 or less, adult: Secondary | ICD-10-CM | POA: Diagnosis not present

## 2020-12-10 DIAGNOSIS — Z841 Family history of disorders of kidney and ureter: Secondary | ICD-10-CM

## 2020-12-10 DIAGNOSIS — M8610 Other acute osteomyelitis, unspecified site: Secondary | ICD-10-CM | POA: Diagnosis not present

## 2020-12-10 DIAGNOSIS — M4628 Osteomyelitis of vertebra, sacral and sacrococcygeal region: Principal | ICD-10-CM | POA: Diagnosis present

## 2020-12-10 DIAGNOSIS — G47419 Narcolepsy without cataplexy: Secondary | ICD-10-CM | POA: Diagnosis present

## 2020-12-10 DIAGNOSIS — F17210 Nicotine dependence, cigarettes, uncomplicated: Secondary | ICD-10-CM | POA: Diagnosis present

## 2020-12-10 DIAGNOSIS — I10 Essential (primary) hypertension: Secondary | ICD-10-CM | POA: Diagnosis present

## 2020-12-10 DIAGNOSIS — Z7401 Bed confinement status: Secondary | ICD-10-CM | POA: Diagnosis not present

## 2020-12-10 DIAGNOSIS — R0902 Hypoxemia: Secondary | ICD-10-CM | POA: Diagnosis not present

## 2020-12-10 DIAGNOSIS — L89154 Pressure ulcer of sacral region, stage 4: Secondary | ICD-10-CM | POA: Diagnosis present

## 2020-12-10 DIAGNOSIS — L0231 Cutaneous abscess of buttock: Secondary | ICD-10-CM | POA: Diagnosis present

## 2020-12-10 DIAGNOSIS — D649 Anemia, unspecified: Secondary | ICD-10-CM | POA: Diagnosis not present

## 2020-12-10 DIAGNOSIS — Z8249 Family history of ischemic heart disease and other diseases of the circulatory system: Secondary | ICD-10-CM | POA: Diagnosis not present

## 2020-12-10 DIAGNOSIS — Z20822 Contact with and (suspected) exposure to covid-19: Secondary | ICD-10-CM | POA: Diagnosis present

## 2020-12-10 DIAGNOSIS — L89310 Pressure ulcer of right buttock, unstageable: Secondary | ICD-10-CM | POA: Diagnosis present

## 2020-12-10 DIAGNOSIS — Z993 Dependence on wheelchair: Secondary | ICD-10-CM | POA: Diagnosis not present

## 2020-12-10 DIAGNOSIS — E785 Hyperlipidemia, unspecified: Secondary | ICD-10-CM | POA: Diagnosis present

## 2020-12-10 DIAGNOSIS — Z880 Allergy status to penicillin: Secondary | ICD-10-CM | POA: Diagnosis not present

## 2020-12-10 DIAGNOSIS — A419 Sepsis, unspecified organism: Secondary | ICD-10-CM | POA: Diagnosis not present

## 2020-12-10 DIAGNOSIS — L8990 Pressure ulcer of unspecified site, unspecified stage: Secondary | ICD-10-CM | POA: Diagnosis present

## 2020-12-10 DIAGNOSIS — K8020 Calculus of gallbladder without cholecystitis without obstruction: Secondary | ICD-10-CM | POA: Diagnosis not present

## 2020-12-10 DIAGNOSIS — L98422 Non-pressure chronic ulcer of back with fat layer exposed: Secondary | ICD-10-CM

## 2020-12-10 DIAGNOSIS — M8668 Other chronic osteomyelitis, other site: Secondary | ICD-10-CM | POA: Diagnosis not present

## 2020-12-10 DIAGNOSIS — L89153 Pressure ulcer of sacral region, stage 3: Secondary | ICD-10-CM | POA: Diagnosis not present

## 2020-12-10 DIAGNOSIS — G319 Degenerative disease of nervous system, unspecified: Secondary | ICD-10-CM | POA: Diagnosis not present

## 2020-12-10 DIAGNOSIS — L89319 Pressure ulcer of right buttock, unspecified stage: Secondary | ICD-10-CM | POA: Diagnosis not present

## 2020-12-10 DIAGNOSIS — Z83438 Family history of other disorder of lipoprotein metabolism and other lipidemia: Secondary | ICD-10-CM | POA: Diagnosis not present

## 2020-12-10 DIAGNOSIS — R5381 Other malaise: Secondary | ICD-10-CM | POA: Diagnosis not present

## 2020-12-10 DIAGNOSIS — E860 Dehydration: Secondary | ICD-10-CM | POA: Diagnosis not present

## 2020-12-10 DIAGNOSIS — R54 Age-related physical debility: Secondary | ICD-10-CM | POA: Diagnosis present

## 2020-12-10 LAB — CBC WITH DIFFERENTIAL/PLATELET
Abs Immature Granulocytes: 0.07 10*3/uL (ref 0.00–0.07)
Basophils Absolute: 0.1 10*3/uL (ref 0.0–0.1)
Basophils Relative: 0 %
Eosinophils Absolute: 0.6 10*3/uL — ABNORMAL HIGH (ref 0.0–0.5)
Eosinophils Relative: 3 %
HCT: 40.9 % (ref 36.0–46.0)
Hemoglobin: 13.2 g/dL (ref 12.0–15.0)
Immature Granulocytes: 0 %
Lymphocytes Relative: 9 %
Lymphs Abs: 1.6 10*3/uL (ref 0.7–4.0)
MCH: 30.5 pg (ref 26.0–34.0)
MCHC: 32.3 g/dL (ref 30.0–36.0)
MCV: 94.5 fL (ref 80.0–100.0)
Monocytes Absolute: 1.5 10*3/uL — ABNORMAL HIGH (ref 0.1–1.0)
Monocytes Relative: 9 %
Neutro Abs: 13.6 10*3/uL — ABNORMAL HIGH (ref 1.7–7.7)
Neutrophils Relative %: 79 %
Platelets: 286 10*3/uL (ref 150–400)
RBC: 4.33 MIL/uL (ref 3.87–5.11)
RDW: 17.2 % — ABNORMAL HIGH (ref 11.5–15.5)
WBC: 17.3 10*3/uL — ABNORMAL HIGH (ref 4.0–10.5)
nRBC: 0 % (ref 0.0–0.2)

## 2020-12-10 LAB — COMPREHENSIVE METABOLIC PANEL
ALT: 12 U/L (ref 0–44)
AST: 17 U/L (ref 15–41)
Albumin: 3.4 g/dL — ABNORMAL LOW (ref 3.5–5.0)
Alkaline Phosphatase: 63 U/L (ref 38–126)
Anion gap: 7 (ref 5–15)
BUN: 13 mg/dL (ref 8–23)
CO2: 24 mmol/L (ref 22–32)
Calcium: 8.7 mg/dL — ABNORMAL LOW (ref 8.9–10.3)
Chloride: 106 mmol/L (ref 98–111)
Creatinine, Ser: 0.57 mg/dL (ref 0.44–1.00)
GFR, Estimated: >60 mL/min (ref >60)
Glucose, Bld: 97 mg/dL (ref 70–99)
Potassium: 4.5 mmol/L (ref 3.5–5.1)
Sodium: 137 mmol/L (ref 135–145)
Total Bilirubin: 0.5 mg/dL (ref 0.3–1.2)
Total Protein: 7 g/dL (ref 6.5–8.1)

## 2020-12-10 LAB — LACTIC ACID, PLASMA
Lactic Acid, Venous: 1.9 mmol/L (ref 0.5–1.9)
Lactic Acid, Venous: 1.9 mmol/L (ref 0.5–1.9)

## 2020-12-10 LAB — PROTIME-INR
INR: 1.1 (ref 0.8–1.2)
Prothrombin Time: 13.7 seconds (ref 11.4–15.2)

## 2020-12-10 LAB — APTT: aPTT: 30 seconds (ref 24–36)

## 2020-12-10 LAB — RESP PANEL BY RT-PCR (FLU A&B, COVID) ARPGX2
Influenza A by PCR: NEGATIVE
Influenza B by PCR: NEGATIVE
SARS Coronavirus 2 by RT PCR: NEGATIVE

## 2020-12-10 MED ORDER — BACLOFEN 10 MG PO TABS
10.0000 mg | ORAL_TABLET | Freq: Three times a day (TID) | ORAL | Status: DC
  Administered 2020-12-10 – 2020-12-12 (×6): 10 mg via ORAL
  Filled 2020-12-10 (×6): qty 1

## 2020-12-10 MED ORDER — LACTATED RINGERS IV BOLUS
1000.0000 mL | Freq: Once | INTRAVENOUS | Status: AC
  Administered 2020-12-10: 1000 mL via INTRAVENOUS

## 2020-12-10 MED ORDER — GADOBUTROL 1 MMOL/ML IV SOLN
4.5000 mL | Freq: Once | INTRAVENOUS | Status: AC | PRN
  Administered 2020-12-10: 4.5 mL via INTRAVENOUS

## 2020-12-10 MED ORDER — IOHEXOL 300 MG/ML  SOLN
100.0000 mL | Freq: Once | INTRAMUSCULAR | Status: AC | PRN
  Administered 2020-12-10: 80 mL via INTRAVENOUS

## 2020-12-10 MED ORDER — HYDROCODONE-ACETAMINOPHEN 5-325 MG PO TABS
1.0000 | ORAL_TABLET | Freq: Four times a day (QID) | ORAL | Status: DC | PRN
  Administered 2020-12-10 – 2020-12-11 (×3): 1 via ORAL
  Filled 2020-12-10 (×4): qty 1

## 2020-12-10 MED ORDER — SODIUM CHLORIDE 0.9 % IV SOLN: 2.0000 g | INTRAVENOUS | Status: DC

## 2020-12-10 MED ORDER — LACTATED RINGERS IV SOLN: INTRAVENOUS | Status: DC

## 2020-12-10 MED ORDER — SODIUM CHLORIDE 0.9 % IV SOLN
2.0000 g | Freq: Two times a day (BID) | INTRAVENOUS | Status: DC
  Administered 2020-12-10 – 2020-12-12 (×4): 2 g via INTRAVENOUS
  Filled 2020-12-10 (×4): qty 2

## 2020-12-10 MED ORDER — SODIUM CHLORIDE 0.9 % IV SOLN: INTRAVENOUS | Status: DC

## 2020-12-10 MED ORDER — DIAZEPAM 2 MG PO TABS
2.0000 mg | ORAL_TABLET | Freq: Four times a day (QID) | ORAL | Status: DC | PRN
  Administered 2020-12-10: 2 mg via ORAL
  Filled 2020-12-10: qty 1

## 2020-12-10 MED ORDER — SODIUM CHLORIDE 0.9 % IV SOLN
2.0000 g | Freq: Once | INTRAVENOUS | Status: DC
  Filled 2020-12-10: qty 2

## 2020-12-10 MED ORDER — VANCOMYCIN HCL IN DEXTROSE 1-5 GM/200ML-% IV SOLN
1000.0000 mg | Freq: Once | INTRAVENOUS | Status: AC
  Administered 2020-12-10: 1000 mg via INTRAVENOUS
  Filled 2020-12-10: qty 200

## 2020-12-10 MED ORDER — HEPARIN SODIUM (PORCINE) 5000 UNIT/ML IJ SOLN
5000.0000 [IU] | Freq: Three times a day (TID) | INTRAMUSCULAR | Status: DC
  Administered 2020-12-10 – 2020-12-11 (×2): 5000 [IU] via SUBCUTANEOUS
  Filled 2020-12-10 (×2): qty 1

## 2020-12-10 MED ORDER — VITAMIN D 25 MCG (1000 UNIT) PO TABS
ORAL_TABLET | Freq: Every day | ORAL | Status: DC
  Administered 2020-12-11 – 2020-12-12 (×2): 1000 [IU] via ORAL
  Filled 2020-12-10 (×2): qty 1

## 2020-12-10 MED ORDER — CELECOXIB 100 MG PO CAPS
200.0000 mg | ORAL_CAPSULE | Freq: Two times a day (BID) | ORAL | Status: DC
  Administered 2020-12-10 – 2020-12-12 (×4): 200 mg via ORAL
  Filled 2020-12-10 (×4): qty 2

## 2020-12-10 MED ORDER — GABAPENTIN 300 MG PO CAPS
600.0000 mg | ORAL_CAPSULE | Freq: Three times a day (TID) | ORAL | Status: DC
  Administered 2020-12-10 – 2020-12-12 (×6): 600 mg via ORAL
  Filled 2020-12-10 (×6): qty 2

## 2020-12-10 MED ORDER — ATORVASTATIN CALCIUM 20 MG PO TABS
20.0000 mg | ORAL_TABLET | Freq: Every day | ORAL | Status: DC
  Administered 2020-12-10 – 2020-12-12 (×3): 20 mg via ORAL
  Filled 2020-12-10 (×3): qty 1

## 2020-12-10 MED ORDER — SODIUM CHLORIDE 0.9 % IV BOLUS
1000.0000 mL | Freq: Once | INTRAVENOUS | Status: DC
Start: 2020-12-10 — End: 2020-12-10

## 2020-12-10 MED ORDER — ACETAMINOPHEN 325 MG PO TABS: 650.0000 mg | ORAL_TABLET | Freq: Four times a day (QID) | ORAL | Status: DC | PRN

## 2020-12-10 MED ORDER — ACETAMINOPHEN 650 MG RE SUPP: 650.0000 mg | Freq: Four times a day (QID) | RECTAL | Status: DC | PRN

## 2020-12-10 MED ORDER — SODIUM CHLORIDE 0.9 % IV SOLN
2.0000 g | INTRAVENOUS | Status: DC
  Administered 2020-12-10: 2 g via INTRAVENOUS
  Filled 2020-12-10: qty 20

## 2020-12-10 MED ORDER — VANCOMYCIN HCL 750 MG/150ML IV SOLN
750.0000 mg | INTRAVENOUS | Status: DC
  Administered 2020-12-11: 750 mg via INTRAVENOUS
  Filled 2020-12-10: qty 150

## 2020-12-10 NOTE — H&P (Addendum)
TRH H&P   Patient Demographics:    Faith [name], is a 65 y.o. female  MRN: [id number]   DOB - 1955-11-29  Admit Date - 12/10/2020  Outpatient Primary MD for the patient is Pcp, No  Referring MD/NP/PA: PA Faulkner   Patient coming from: Home  Chief Complaint  Patient presents with   Loss of Consciousness      HPI:    Faith [name]  is a 65 y.o. female, with past medical history of hypertension, multiple sclerosis, wheelchair dependent, pressure ulcer in the right buttock, patient presents to ED secondary to complaints of syncopal episodes, woke up this morning, felt overall extremely weak, at baseline she is able to assist herself from bed to chair, reports few episodes of urinary incontinence, no dysuria, no fever, no chills, she does report generalized weakness and poor appetite, patient reports that she has a pressure ulcers, where she had a visiting nurse to check doing daily dressing for hair, reports she had an episode of loss of consciousness, reactivity noted, no stool or urine incontinence.  She denies any cough, chest pain, shortness of breath, no nausea, vomiting or diarrhea. - in ED to be hypothermic 96 Fahrenheit rectal temperature, work-up significant for leukocytosis at 17.3, urine analysis still pending, CT abdomen pelvis significant for acute osteomyelitis and her pressure ulcer area, [facility] hospitalist consulted to admit.    Review of systems:    In addition to the HPI above, No Fever-chills, No Headache, No changes with Vision or hearing, No problems swallowing food or Liquids, but she has poor appetite No Chest pain, Cough or Shortness of Breath, No Abdominal pain, No Nausea or Vommitting, Bowel movements are regular, she had increased urinary frequency and incontinence No dysuria, She has pressure ulcers No new joints pains-aches,  No new weakness, tingling,  numbness in any extremity, No recent weight gain or loss, No polyuria, polydypsia or polyphagia, No significant Mental Stressors.  A full 10 point Review of Systems was done, except as stated above, all other Review of Systems were negative.   With Past History of the following :    Past Medical History:  Diagnosis Date   Anemia    my whole life, Used to take iron   Arthritis    spine, lumbar   COPD (chronic obstructive pulmonary disease) (HCC)    Emphysema of lung ([place])    HTN (hypertension) 03/01/2020   Hyperlipidemia 11/28/2019   Narcolepsy    by history, has nightmares   Neuromuscular disorder ([place])    leg issues from spine      Past Surgical History:  Procedure Laterality Date   ANTERIOR CERVICAL DECOMP/DISCECTOMY FUSION N/A 01/19/2019   Procedure: ANTERIOR CERVICAL DECOMPRESSION/DISCECTOMY Cervical three - four;  Surgeon: Ashok Pall, MD;  Location: [facility];  Service: Neurosurgery;  Laterality: N/A;   EYE SURGERY Right    ORIF FACIAL FRACTURE  POSTERIOR CERVICAL LAMINECTOMY N/A 01/19/2019   Procedure: POSTERIOR CERVICAL LAMINECTOMY Removal of Synovial Cyst and posterior cervical fusion;  Surgeon: Ashok Pall, MD;  Location: [facility];  Service: Neurosurgery;  Laterality: N/A;   TEE WITHOUT CARDIOVERSION N/A 03/17/2020   Procedure: TRANSESOPHAGEAL ECHOCARDIOGRAM (TEE) WITH PROPOFOL;  Surgeon: Satira Sark, MD;  [facility];  Service: Cardiovascular;  Laterality: N/A;   TUBAL LIGATION     tubaligation        Social History:     Social History   Tobacco Use   Smoking status: Every Day    Packs/day: 0.25    Years: 45.00    Pack years: 11.25    Types: Cigarettes   Smokeless tobacco: Never   Tobacco comments:    cutting down  Substance Use Topics   Alcohol use: Yes    Alcohol/week: 2.0 standard drinks    Types: 2 Cans of beer per week     Lives -at home with her son  Mobility -wheelchair dependent     Family History :     Family  History  Problem Relation Age of Onset   Heart attack Mother    Kidney failure Mother    Diabetes Mother    Heart disease Mother    Hyperlipidemia Mother    Hypertension Mother    Heart attack Father 37   Hypertension Father    Hypertension Sister    Diabetes Sister    Other Brother        house fire   Hypertension Brother    Seizures Brother    Heart disease Brother        mitral valve   Arthritis Daughter        DJD, AS fibromyalgia   Polymyositis Daughter    Cancer Maternal Grandfather        lung   Heart disease Maternal Grandfather    Other Paternal Grandmother        house fire   Alcohol abuse Son       Home Medications:   Prior to Admission medications   Medication Sig Start Date End Date Taking? Authorizing Provider  baclofen (LIORESAL) 10 MG tablet Take 1 tablet (10 mg total) by mouth 3 (three) times daily. 09/24/19  Yes Lovorn, Jinny Blossom, MD  celecoxib (CELEBREX) 200 MG capsule Take 1 capsule (200 mg total) by mouth 2 (two) times daily. 08/18/20  Yes Gosrani, Nimish C, MD  Cholecalciferol 50 MCG (2000 UT) TABS Take 2,000 Int'l Units by mouth daily.   Yes [provider]  diazepam (VALIUM) 5 MG tablet TAKE (1) TABLET BY MOUTH EVERY SIX HOURS AS NEEDED FOR MUSCLE SPASMS. 09/29/20  Yes Lovorn, Jinny Blossom, MD  gabapentin (NEURONTIN) 300 MG capsule Take 2 capsules (600 mg total) by mouth 3 (three) times daily. 02/05/19  Yes Angiulli, Lavon Paganini, PA-C  HYDROcodone-acetaminophen (NORCO/VICODIN) 5-325 MG tablet Take 1 tablet by mouth every 6 (six) hours as needed for moderate pain. 09/11/20  Yes Gosrani, Nimish C, MD  ibuprofen (ADVIL) 800 MG tablet Take 800 mg by mouth every 8 (eight) hours as needed for fever, headache or mild pain.   Yes [provider]  metroNIDAZOLE (FLAGYL) 500 MG tablet 500 mg daily. Topical for wound 11/18/20  Yes [provider]  propranolol (INDERAL) 40 MG tablet Take 1 tablet (40 mg total) by mouth 2 (two) times daily. 08/26/20  Yes  Gosrani, Nimish C, MD  atorvastatin (LIPITOR) 20 MG tablet Take 1 tablet (20 mg total)  by mouth daily. Patient not taking: Reported on 12/10/2020 01/30/20   Ailene Ards, NP  linezolid (ZYVOX) 600 MG tablet Take 1 tablet (600 mg total) by mouth 2 (two) times daily. Patient not taking: Reported on 12/10/2020 04/21/20   Doree Albee, MD     Allergies:     Allergies  Allergen Reactions   Darvon [Propoxyphene] Other (See Comments)    Hallicuations   Oxycodone-Acetaminophen     Does not have any affect on her   Penicillins Rash    Did it involve swelling of the face/tongue/throat, SOB, or low BP? Unknown Did it involve sudden or severe rash/hives, skin peeling, or any reaction on the inside of your mouth or nose? Unknown Did you need to seek medical attention at a hospital or doctor's office? Unknown When did it last happen?      Unknown If all above answers are "NO", may proceed with cephalosporin use.      Physical Exam:   Vitals  Blood pressure 103/72, pulse 73, temperature 97.6 F (36.4 C), temperature source Oral, resp. rate 15, height 5\' 4"  (1.626 m), weight 44.4 kg, SpO2 99 %.   1. General frail, thin appearing, ill-appearing female, laying in bed, no apparent distress  2. Normal affect and insight, Not Suicidal or Homicidal, Awake Alert, Oriented X 3.  3. No F.N deficits, ALL C.Nerves Intact, moves all extremities, but with generalized weakness related to MS.  2-3/5  4. Ears and Eyes appear Normal, Conjunctivae clear, PERRLA. Moist Oral Mucosa.  5. Supple Neck, No JVD, No cervical lymphadenopathy appriciated, No Carotid Bruits.  6. Symmetrical Chest wall movement, Good air movement bilaterally, CTAB.  7. RRR, No Gallops, Rubs or Murmurs, No Parasternal Heave.  8. Positive Bowel Sounds, Abdomen Soft, No tenderness, No organomegaly appriciated,No rebound -guarding or rigidity.  9.  Gluteal ulcer, with some discharge, foul-smelling odor, please see picture  below  10.  He is with significant muscle wasting, joints appear normal with no effusion  11. No Palpable Lymph Nodes in Neck or Axillae      Data Review:    CBC Recent Labs  Lab 12/10/20 1336  WBC 17.3*  HGB 13.2  HCT 40.9  PLT 286  MCV 94.5  MCH 30.5  MCHC 32.3  RDW 17.2*  LYMPHSABS 1.6  MONOABS 1.5*  EOSABS 0.6*  BASOSABS 0.1   ------------------------------------------------------------------------------------------------------------------  Chemistries  Recent Labs  Lab 12/10/20 1336  NA 137  K 4.5  CL 106  CO2 24  GLUCOSE 97  BUN 13  CREATININE 0.57  CALCIUM 8.7*  AST 17  ALT 12  ALKPHOS 63  BILITOT 0.5   ------------------------------------------------------------------------------------------------------------------ estimated creatinine clearance is 49.8 mL/min (by C-G formula based on SCr of 0.57 mg/dL). ------------------------------------------------------------------------------------------------------------------ No results for input(s): TSH, T4TOTAL, T3FREE, THYROIDAB in the last 72 hours.  Invalid input(s): FREET3  Coagulation profile Recent Labs  Lab 12/10/20 1639  INR 1.1   ------------------------------------------------------------------------------------------------------------------- No results for input(s): DDIMER in the last 72 hours. -------------------------------------------------------------------------------------------------------------------  Cardiac Enzymes No results for input(s): CKMB, TROPONINI, MYOGLOBIN in the last 168 hours.  Invalid input(s): CK ------------------------------------------------------------------------------------------------------------------ No results found for: BNP   ---------------------------------------------------------------------------------------------------------------  Urinalysis    Component Value Date/Time   COLORURINE AMBER (A) 03/13/2020 1020   APPEARANCEUR HAZY (A)  03/13/2020 1020   LABSPEC 1.017 03/13/2020 1020   PHURINE 6.0 03/13/2020 1020   GLUCOSEU NEGATIVE 03/13/2020 1020   HGBUR NEGATIVE 03/13/2020 [place] 03/13/2020 1020   KETONESUR  20 (A) 03/13/2020 1020   PROTEINUR 100 (A) 03/13/2020 1020   NITRITE NEGATIVE 03/13/2020 1020   LEUKOCYTESUR NEGATIVE 03/13/2020 1020    ----------------------------------------------------------------------------------------------------------------   Imaging Results:    CT Head Wo Contrast  Result Date: 12/10/2020 CLINICAL DATA:  Transient ischemic attack. Positive loss of consciousness. EXAM: CT HEAD WITHOUT CONTRAST TECHNIQUE: Contiguous axial images were obtained from the base of the skull through the vertex without intravenous contrast. COMPARISON:  January 18, 2019.  January 05, 2009. FINDINGS: Brain: Mild diffuse cortical atrophy is noted. Multiple periventricular white matter low densities are noted consistent with history of multiple sclerosis. No mass effect or midline shift is noted. Ventricular size is within normal limits. There is no evidence of mass lesion, hemorrhage or acute infarction. Vascular: No hyperdense vessel or unexpected calcification. Skull: Normal. Negative for fracture or focal lesion. Sinuses/Orbits: No acute finding. Other: None. IMPRESSION: Findings consistent with multiple sclerosis. No acute intracranial abnormality seen. Electronically Signed   By: Marijo Conception M.D.   On: 12/10/2020 14:52   CT ABDOMEN PELVIS W CONTRAST  Result Date: 12/10/2020 CLINICAL DATA:  History of multiple sclerosis with loss of consciousness. EXAM: CT ABDOMEN AND PELVIS WITH CONTRAST TECHNIQUE: Multidetector CT imaging of the abdomen and pelvis was performed using the standard protocol following bolus administration of intravenous contrast. CONTRAST:  74mL OMNIPAQUE IOHEXOL 300 MG/ML  SOLN COMPARISON:  None. FINDINGS: Lower chest: Mild atelectasis is seen within the bilateral lung  bases. Elevation of the left hemidiaphragm is noted. Hepatobiliary: No focal liver abnormality is seen. Multiple gallstones are seen within a moderately distended gallbladder. There is no evidence of gallbladder wall thickening or pericholecystic inflammation. Pancreas: Unremarkable. No pancreatic ductal dilatation or surrounding inflammatory changes. Spleen: Normal in size without focal abnormality. Adrenals/Urinary Tract: There is a 2.0 cm x 1.1 cm low-attenuation left adrenal mass (approximately 80.94 Hounsfield units). The right adrenal gland is normal in appearance. Kidneys are normal in size without focal lesions or obstructing renal calculi. Prominent bilateral extrarenal pelvis are noted. The urinary bladder is moderately distended and otherwise normal in appearance. Stomach/Bowel: Mild thickening of the distal esophagus is seen to the level of the gastroesophageal junction. Stomach is within normal limits. Appendix appears normal. No evidence of bowel wall thickening, distention, or inflammatory changes. Vascular/Lymphatic: There is marked severity calcification and atherosclerosis of the abdominal aorta and bilateral common iliac arteries. Extension to involve the bilateral internal and bilateral external common iliac arteries is noted. No enlarged abdominal or pelvic lymph nodes. Reproductive: Numerous dilated and tortuous vessels are seen along the periphery of an otherwise normal appearing uterus. The bilateral adnexa are unremarkable. Other: A 6.9 cm x 2.7 cm decubitus ulcer is seen along the posterior aspect of the lower coccyx. This extends inferiorly to the level of the rectum (approximately 4.6 cm in length). Cortical destruction involving lower coccyx cannot be excluded. No abdominopelvic ascites. Musculoskeletal: Degenerative changes are seen throughout the lumbar spine, most prominent at the level of L4-L5. IMPRESSION: 1. Large decubitus ulcer along the posterior aspect of the lower coccyx, as  described above. Associated acute osteomyelitis cannot completely be excluded. Correlation with follow-up MRI is recommended. 2. Cholelithiasis without evidence of acute cholecystitis. 3. Left adrenal mass which may represent adrenal adenoma. Further evaluation with adrenal protocol CT is recommended. 4. Findings suggestive of pelvic congestion syndrome. 5. Degenerative changes of the lumbar spine, most prominent at the level of L4-L5. Aortic Atherosclerosis (ICD10-I70.0). Electronically Signed   By: Joyce Gross.D.  On: 12/10/2020 16:12   DG Chest Portable 1 View  Result Date: 12/10/2020 CLINICAL DATA:  Weakness. EXAM: PORTABLE CHEST 1 VIEW COMPARISON:  March 13, 2020. FINDINGS: The heart size and mediastinal contours are within normal limits. Both lungs are clear. The visualized skeletal structures are unremarkable. IMPRESSION: No active disease. Electronically Signed   By: Marijo Conception M.D.   On: 12/10/2020 15:06       Assessment & Plan:    Active Problems:   Multiple sclerosis (HCC)   Hyperlipidemia   COPD (chronic obstructive pulmonary disease) (HCC)   Decubital ulcer   Acute osteomyelitis (HCC)    Buttocks/decubitus pressure ulcer with possible acute osteomyelitis -patient with local wound care at home, she presented with generalized weakness/syncope, most likely related to her infected pressure ulcer with possible acute osteomyelitis. -We will obtain MRI of pelvis to confirm presence of acute osteomyelitis as this will determine treatment. -Blood cultures were sent, will follow on results. -Continue empirically with IV vancomycin and Rocephin. -General surgery consult requested in epic, regarding wound debridement and possible bone biopsy so we can narrow her antibiotics.  Syncope -This is most likely related to infectious process, blood pressure has improved currently, no significant events on telemetry, EKG nonacute.  MS -Continue with home medications including  aspirin, Celebrex, Valium as needed, gabapentin and as needed Vicodin  Hyperlipidemia -Continue with statin  Hypertension -On low-dose propranolol, will resume with more stable  Left adrenal mass -Incidental findings of 2 cm x 1.1 cm left adrenal mass, work-up can be pursued as an outpatient including an adrenal protocol CT.  Goals of care -And is actively followed by with oral care, she confirms she is a full code, will request palliative care consult   DVT Prophylaxis   Lovenox   AM Labs Ordered, also please review Full Orders  Family Communication: Admission, patients condition and plan of care including tests being ordered have been discussed with the patient and daughter phone who indicate understanding and agree with the plan and Code Status.  Code Status Full   Likely DC to  Home  Condition GUARDED    Consults called: general surgery requested in EPIC  Admission status: inpatient   Time spent in minutes : 60 minutes   Phillips Climes M.D on 12/10/2020 at 5:24 PM   [facility] - Office  [phone]

## 2020-12-10 NOTE — ED Notes (Signed)
Patient transported to CT 

## 2020-12-10 NOTE — Progress Notes (Addendum)
Pharmacy Antibiotic Note  Faith [name] is a 65 y.o. female admitted on 12/10/2020 with cellulitis.  Pharmacy has been consulted for Vancomycin and Cefepime dosing. Patient has tolerated ceftriaxone in the past.  Plan: Vancomycin 1gm IV loading dose, then 750mg  IV q24h for AUC 548 Cefepime 2gm IV q12h F/U cxs and clinical progress Monitor V/S, labs  Height: 5\' 4"  (162.6 cm) Weight: 44.4 kg (97 lb 14.4 oz) IBW/kg (Calculated) : 54.7  Temp (24hrs), Avg:97.6 F (36.4 C), Min:97.6 F (36.4 C), Max:97.6 F (36.4 C)  Recent Labs  Lab 12/10/20 1336  WBC 17.3*  CREATININE 0.57  LATICACIDVEN 1.9    Estimated Creatinine Clearance: 49.8 mL/min (by C-G formula based on SCr of 0.57 mg/dL).    Allergies  Allergen Reactions   Darvon [Propoxyphene] Other (See Comments)    Hallicuations   Oxycodone-Acetaminophen     Does not have any affect on her   Penicillins Rash    Did it involve swelling of the face/tongue/throat, SOB, or low BP? Unknown Did it involve sudden or severe rash/hives, skin peeling, or any reaction on the inside of your mouth or nose? Unknown Did you need to seek medical attention at a hospital or doctor's office? Unknown When did it last happen?      Unknown If all above answers are "NO", may proceed with cephalosporin use.     Antimicrobials this admission: ceftriaxone 10/12 >> 10/12 Cefepime 10/12>> Vancomycin 10/12 >>   Microbiology results: 10/12 BCx: pending  Thank you for allowing pharmacy to be a part of this patient's care.  Isac Sarna, BS [place], [place] Clinical Pharmacist Pager [phone] 12/10/2020 3:54 PM

## 2020-12-10 NOTE — ED Notes (Signed)
This pt. Has a purulent sacral wound. Re-dressed wound.

## 2020-12-10 NOTE — ED Provider Notes (Signed)
[facility] EMERGENCY DEPARTMENT Provider Note   CSN: [id number] Arrival date & time: 12/10/20  1133     History Chief Complaint  Patient presents with   Loss of Consciousness    Faith [name] is a 65 y.o. female.  HPI  Patient with significant medical history of hypertension, MS, stage I decubitus ulcer of the right buttocks presents to the emergency department with chief complaint of almost syncopal episode.  Patient states today she woke up around 9 AM states that she felt overall extremely weak in her upper and lower extremities, states that she had to urinate 4 times today which is unusual for her, she does not endorse dysuria, hematuria, foul-smelling urine, she states on the fourth time after using the commode she went back into her wheelchair and was unable to get up, she states that she generally can transfer from her wheelchair into bed but was unable to move.  She states that she was just overall weak, she stated her right arm was the weakest part of  her whole body, she had no weakness in her right leg no headaches, change in vision, paresthesias in the upper or lower extremities.  She denies recent head trauma, is not on anticoagulant, she has no associated fevers, chills, neck pain, worsening pedal edema, no history of IV drug use.  She states that she called for her son to help her out of her wheelchair, he laid on the ground and she states that she does not remember this part.  Patient states episode happened around 11:00 today, she states that she still feels overall extremely weak, she states she is feeling slightly better but still is weak.  She has no other complaints at this time.  She states that she is tolerating p.o., she denies fevers, chills, nasal congestion, chest pain, shortness of breath, cough, stomach pains, nausea vomiting diarrhea.    Past Medical History:  Diagnosis Date   Anemia    my whole life, Used to take iron   Arthritis    spine, lumbar   COPD  (chronic obstructive pulmonary disease) (HCC)    Emphysema of lung ([place])    HTN (hypertension) 03/01/2020   Hyperlipidemia 11/28/2019   Narcolepsy    by history, has nightmares   Neuromuscular disorder ([place])    leg issues from spine    Patient Active Problem List   Diagnosis Date Noted   Malnutrition of moderate degree 03/29/2020   Bacteremia    Sacral decubitus ulcer, stage II ([place])    Acute appendicitis    Decubital ulcer 03/13/2020   SIRS (systemic inflammatory response syndrome) ([place]) 03/13/2020   Bilateral leg weakness 03/01/2020   COPD (chronic obstructive pulmonary disease) ([place]) 03/01/2020   HTN (hypertension) 03/01/2020   Pressure injury of right buttock, stage 2 ([place]) 02/08/2020   PVD (peripheral vascular disease) ([place]) 11/28/2019   Hyperlipidemia 11/28/2019   Wheelchair dependence 09/24/2019   Cervical myelopathy ([place]) 01/22/2019   Spinal cord compression due to degenerative disorder of spinal column 01/20/2019   Synovial cyst    Spondylolisthesis, cervical region 01/19/2019   Chronic back pain 01/18/2019   Multiple sclerosis exacerbation ([place]) 01/17/2019   Erythrocytosis 09/13/2018   Vitamin D deficiency 03/09/2016   Multiple sclerosis ([place]) 02/24/2016   Tobacco use 12/11/2015   Aortic atherosclerosis ([place]) 12/11/2015   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   Falls frequently 12/11/2015   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   Other emphysema ([place]) 12/11/2015  Past Surgical History:  Procedure Laterality Date   ANTERIOR CERVICAL DECOMP/DISCECTOMY FUSION N/A 01/19/2019   Procedure: ANTERIOR CERVICAL DECOMPRESSION/DISCECTOMY Cervical three - four;  Surgeon: Ashok Pall, MD;  Location: [place];  Service: Neurosurgery;  Laterality: N/A;   EYE SURGERY Right    ORIF FACIAL FRACTURE     POSTERIOR CERVICAL LAMINECTOMY N/A 01/19/2019   Procedure: POSTERIOR CERVICAL LAMINECTOMY Removal of Synovial Cyst and posterior cervical  fusion;  Surgeon: Ashok Pall, MD;  Location: [place];  Service: Neurosurgery;  Laterality: N/A;   TEE WITHOUT CARDIOVERSION N/A 03/17/2020   Procedure: TRANSESOPHAGEAL ECHOCARDIOGRAM (TEE) WITH PROPOFOL;  Surgeon: Satira Sark, MD;  [facility];  Service: Cardiovascular;  Laterality: N/A;   TUBAL LIGATION     tubaligation       OB History     Gravida  3   Para  3   Term  3   Preterm      AB      Living         SAB      IAB      Ectopic      Multiple      Live Births              Family History  Problem Relation Age of Onset   Heart attack Mother    Kidney failure Mother    Diabetes Mother    Heart disease Mother    Hyperlipidemia Mother    Hypertension Mother    Heart attack Father 6   Hypertension Father    Hypertension Sister    Diabetes Sister    Other Brother        house fire   Hypertension Brother    Seizures Brother    Heart disease Brother        mitral valve   Arthritis Daughter        DJD, AS fibromyalgia   Polymyositis Daughter    Cancer Maternal Grandfather        lung   Heart disease Maternal Grandfather    Other Paternal Grandmother        house fire   Alcohol abuse Son     Social History   Tobacco Use   Smoking status: Every Day    Packs/day: 0.25    Years: 45.00    Pack years: 11.25    Types: Cigarettes   Smokeless tobacco: Never   Tobacco comments:    cutting down  Vaping Use   Vaping Use: Never used  Substance Use Topics   Alcohol use: Yes    Alcohol/week: 2.0 standard drinks    Types: 2 Cans of beer per week   Drug use: No    Home Medications Prior to Admission medications   Medication Sig Start Date End Date Taking? Authorizing Provider  baclofen (LIORESAL) 10 MG tablet Take 1 tablet (10 mg total) by mouth 3 (three) times daily. 09/24/19  Yes Lovorn, Jinny Blossom, MD  celecoxib (CELEBREX) 200 MG capsule Take 1 capsule (200 mg total) by mouth 2 (two) times daily. 08/18/20  Yes Gosrani, Nimish C, MD   Cholecalciferol 50 MCG (2000 UT) TABS Take 2,000 Int'l Units by mouth daily.   Yes [provider]  diazepam (VALIUM) 5 MG tablet TAKE (1) TABLET BY MOUTH EVERY SIX HOURS AS NEEDED FOR MUSCLE SPASMS. 09/29/20  Yes Lovorn, Jinny Blossom, MD  gabapentin (NEURONTIN) 300 MG capsule Take 2 capsules (600 mg total) by mouth 3 (three) times daily. 02/05/19  Yes Angiulli, Lavon Paganini, PA-C  HYDROcodone-acetaminophen (NORCO/VICODIN) 5-325 MG tablet Take 1 tablet by mouth every 6 (six) hours as needed for moderate pain. 09/11/20  Yes Gosrani, Nimish C, MD  ibuprofen (ADVIL) 800 MG tablet Take 800 mg by mouth every 8 (eight) hours as needed for fever, headache or mild pain.   Yes [provider]  metroNIDAZOLE (FLAGYL) 500 MG tablet 500 mg daily. Topical for wound 11/18/20  Yes [provider]  propranolol (INDERAL) 40 MG tablet Take 1 tablet (40 mg total) by mouth 2 (two) times daily. 08/26/20  Yes Gosrani, Nimish C, MD  atorvastatin (LIPITOR) 20 MG tablet Take 1 tablet (20 mg total) by mouth daily. Patient not taking: Reported on 12/10/2020 01/30/20   Ailene Ards, NP  linezolid (ZYVOX) 600 MG tablet Take 1 tablet (600 mg total) by mouth 2 (two) times daily. Patient not taking: Reported on 12/10/2020 04/21/20   Doree Albee, MD    Allergies    Darvon [propoxyphene], Oxycodone-acetaminophen, and Penicillins  Review of Systems   Review of Systems  Constitutional:  Positive for fatigue. Negative for chills and fever.  HENT:  Negative for congestion.   Respiratory:  Negative for shortness of breath.   Cardiovascular:  Negative for chest pain.  Gastrointestinal:  Negative for abdominal pain.  Genitourinary:  Negative for enuresis.  Musculoskeletal:  Negative for back pain.  Skin:  Negative for rash.  Neurological:  Positive for weakness. Negative for dizziness and headaches.  Hematological:  Does not bruise/bleed easily.   Physical Exam Updated Vital Signs BP 97/74   Pulse 78   Temp  97.6 F (36.4 C) (Oral)   Resp 14   Ht '5\' 4"'  (1.626 m)   Wt 44.4 kg   SpO2 100%   BMI 16.80 kg/m   Physical Exam Vitals and nursing note reviewed.  Constitutional:      General: She is not in acute distress.    Appearance: She is ill-appearing.     Comments: Ill-appearing, deconditioned state.  HENT:     Head: Normocephalic and atraumatic.     Nose: No congestion.     Mouth/Throat:     Mouth: Mucous membranes are moist.     Pharynx: Oropharynx is clear.  Eyes:     Extraocular Movements: Extraocular movements intact.     Conjunctiva/sclera: Conjunctivae normal.     Pupils: Pupils are equal, round, and reactive to light.  Cardiovascular:     Rate and Rhythm: Normal rate and regular rhythm.     Pulses: Normal pulses.     Heart sounds: No murmur heard.   No friction rub. No gallop.  Pulmonary:     Effort: No respiratory distress.     Breath sounds: No wheezing, rhonchi or rales.  Abdominal:     Palpations: Abdomen is soft.     Tenderness: There is no abdominal tenderness. There is no right CVA tenderness or left CVA tenderness.  Musculoskeletal:     Cervical back: Neck supple.     Comments: Stable to move all 4 extremities, she has globalized weakness.  2 out of 5 strength in the upper and lower extremities.  Neurovascularly intact.  Skin:    General: Skin is warm and dry.     Comments: With chaperone present sacral ulcer was examined, she has a large ulcer which is about the size of a tennis ball, she has exposed fat tissue present, unclear if there is necrotic bone or muscle tissue, surrounding erythema, purulent  discharge present.  Please see pictures for full detail.  Neurological:     Mental Status: She is alert.     GCS: GCS eye subscore is 4. GCS verbal subscore is 5. GCS motor subscore is 6.     Comments: Cranial nerves II through XII grossly intact, no diffuse word finding, no slurring of words, able to follow two-step commands, no unilateral weakness present.   Globally generalized weakness 2 out of 5 strength, 2+ patellar reflexes bilaterally.  Psychiatric:        Mood and Affect: Mood normal.     ED Results / Procedures / Treatments   Labs (all labs ordered are listed, but only abnormal results are displayed) Labs Reviewed  COMPREHENSIVE METABOLIC PANEL - Abnormal; Notable for the following components:      Result Value   Calcium 8.7 (*)    Albumin 3.4 (*)    All other components within normal limits  CBC WITH DIFFERENTIAL/PLATELET - Abnormal; Notable for the following components:   WBC 17.3 (*)    RDW 17.2 (*)    Neutro Abs 13.6 (*)    Monocytes Absolute 1.5 (*)    Eosinophils Absolute 0.6 (*)    All other components within normal limits  CULTURE, BLOOD (ROUTINE X 2)  CULTURE, BLOOD (ROUTINE X 2)  RESP PANEL BY RT-PCR (FLU A&B, COVID) ARPGX2  URINE CULTURE  LACTIC ACID, PLASMA  LACTIC ACID, PLASMA  URINALYSIS, ROUTINE W REFLEX MICROSCOPIC  PROTIME-INR  APTT    EKG None  Radiology CT Head Wo Contrast  Result Date: 12/10/2020 CLINICAL DATA:  Transient ischemic attack. Positive loss of consciousness. EXAM: CT HEAD WITHOUT CONTRAST TECHNIQUE: Contiguous axial images were obtained from the base of the skull through the vertex without intravenous contrast. COMPARISON:  January 18, 2019.  January 05, 2009. FINDINGS: Brain: Mild diffuse cortical atrophy is noted. Multiple periventricular white matter low densities are noted consistent with history of multiple sclerosis. No mass effect or midline shift is noted. Ventricular size is within normal limits. There is no evidence of mass lesion, hemorrhage or acute infarction. Vascular: No hyperdense vessel or unexpected calcification. Skull: Normal. Negative for fracture or focal lesion. Sinuses/Orbits: No acute finding. Other: None. IMPRESSION: Findings consistent with multiple sclerosis. No acute intracranial abnormality seen. Electronically Signed   By: Marijo Conception M.D.   On: 12/10/2020  14:52   CT ABDOMEN PELVIS W CONTRAST  Result Date: 12/10/2020 CLINICAL DATA:  History of multiple sclerosis with loss of consciousness. EXAM: CT ABDOMEN AND PELVIS WITH CONTRAST TECHNIQUE: Multidetector CT imaging of the abdomen and pelvis was performed using the standard protocol following bolus administration of intravenous contrast. CONTRAST:  59m OMNIPAQUE IOHEXOL 300 MG/ML  SOLN COMPARISON:  None. FINDINGS: Lower chest: Mild atelectasis is seen within the bilateral lung bases. Elevation of the left hemidiaphragm is noted. Hepatobiliary: No focal liver abnormality is seen. Multiple gallstones are seen within a moderately distended gallbladder. There is no evidence of gallbladder wall thickening or pericholecystic inflammation. Pancreas: Unremarkable. No pancreatic ductal dilatation or surrounding inflammatory changes. Spleen: Normal in size without focal abnormality. Adrenals/Urinary Tract: There is a 2.0 cm x 1.1 cm low-attenuation left adrenal mass (approximately 80.94 Hounsfield units). The right adrenal gland is normal in appearance. Kidneys are normal in size without focal lesions or obstructing renal calculi. Prominent bilateral extrarenal pelvis are noted. The urinary bladder is moderately distended and otherwise normal in appearance. Stomach/Bowel: Mild thickening of the distal esophagus is seen to the level of  the gastroesophageal junction. Stomach is within normal limits. Appendix appears normal. No evidence of bowel wall thickening, distention, or inflammatory changes. Vascular/Lymphatic: There is marked severity calcification and atherosclerosis of the abdominal aorta and bilateral common iliac arteries. Extension to involve the bilateral internal and bilateral external common iliac arteries is noted. No enlarged abdominal or pelvic lymph nodes. Reproductive: Numerous dilated and tortuous vessels are seen along the periphery of an otherwise normal appearing uterus. The bilateral adnexa are  unremarkable. Other: A 6.9 cm x 2.7 cm decubitus ulcer is seen along the posterior aspect of the lower coccyx. This extends inferiorly to the level of the rectum (approximately 4.6 cm in length). Cortical destruction involving lower coccyx cannot be excluded. No abdominopelvic ascites. Musculoskeletal: Degenerative changes are seen throughout the lumbar spine, most prominent at the level of L4-L5. IMPRESSION: 1. Large decubitus ulcer along the posterior aspect of the lower coccyx, as described above. Associated acute osteomyelitis cannot completely be excluded. Correlation with follow-up MRI is recommended. 2. Cholelithiasis without evidence of acute cholecystitis. 3. Left adrenal mass which may represent adrenal adenoma. Further evaluation with adrenal protocol CT is recommended. 4. Findings suggestive of pelvic congestion syndrome. 5. Degenerative changes of the lumbar spine, most prominent at the level of L4-L5. Aortic Atherosclerosis (ICD10-I70.0). Electronically Signed   By: Virgina Norfolk M.D.   On: 12/10/2020 16:12   DG Chest Portable 1 View  Result Date: 12/10/2020 CLINICAL DATA:  Weakness. EXAM: PORTABLE CHEST 1 VIEW COMPARISON:  March 13, 2020. FINDINGS: The heart size and mediastinal contours are within normal limits. Both lungs are clear. The visualized skeletal structures are unremarkable. IMPRESSION: No active disease. Electronically Signed   By: Marijo Conception M.D.   On: 12/10/2020 15:06    Procedures .Critical Care E&M Performed by: Marcello Fennel, PA-C  Critical care provider statement:    Critical care time (minutes):  45   Critical care time was exclusive of:  Separately billable procedures and treating other patients   Critical care was necessary to treat or prevent imminent or life-threatening deterioration of the following conditions:  Sepsis   Critical care was time spent personally by me on the following activities:  Discussions with consultants, blood draw for  specimens, evaluation of patient's response to treatment, obtaining history from patient or surrogate, ordering and performing treatments and interventions, ordering and review of laboratory studies, ordering and review of radiographic studies, pulse oximetry, re-evaluation of patient's condition and review of old charts   I assumed direction of critical care for this patient from another provider in my specialty: no     Care discussed with: admitting provider   After initial E/M assessment, critical care services were subsequently performed that were exclusive of separately billable procedures or treatment.     Medications Ordered in ED Medications  lactated ringers infusion (has no administration in time range)  vancomycin (VANCOCIN) IVPB 1000 mg/200 mL premix (has no administration in time range)  lactated ringers bolus 1,000 mL (has no administration in time range)  cefTRIAXone (ROCEPHIN) 2 g in sodium chloride 0.9 % 100 mL IVPB (0 g Intravenous Stopped 12/10/20 1631)  vancomycin (VANCOREADY) IVPB 750 mg/150 mL (has no administration in time range)  lactated ringers bolus 1,000 mL (0 mLs Intravenous Stopped 12/10/20 1631)  iohexol (OMNIPAQUE) 300 MG/ML solution 100 mL (80 mLs Intravenous Contrast Given 12/10/20 1539)    ED Course  I have reviewed the triage vital signs and the nursing notes.  Pertinent labs & imaging results  that were available during my care of the patient were reviewed by me and considered in my medical decision making (see chart for details).  Clinical Course as of 12/10/20 1641  Wed Dec 11, 2511  7018 65 year old female history of MS here after loss of consciousness at home.  EMS states initial BP was 80/60.  Improvement with IV fluids.  Patient getting labs EKG.  Disposition per results of testing. [MB]    Clinical Course User Index [MB] Hayden Rasmussen, MD   MDM Rules/Calculators/A&P                          Initial impression-patient presents with  generalized weakness.  She is alert, does not appear acute stress, vital signs noted for hypotension.  I am concerned for possible sepsis, will obtain sepsis labs work-up and reassess.  Work-up-CBC shows leukocytosis of 17.3, CMP shows low calcium of 8.7, first lactic is 1.9, chest x-ray is unremarkable, CT head negative for acute findings, CT abdomen pelvis concerning for possible acute osteomyelitis, cholelithiasis without acute cholecystitis, left adrenal mass represent likely adrenal adenoma, pelvic congestion syndrome, degenerative change of the lumbar spine.  Reassessment-after ulcer was examined his rectal temperature was obtained and she was found to be hypothermic, with an elevated white count, hypotension, and a source will initiate code sepsis.  We will also obtain CT abdomen pelvis for further evaluation of the sacral ulcer.  Patient was reassessed after additional liter of fluids, BP stays in the upper 90s to low 100s, mag is stable at this time, she has no other complaints.  Will recommend admission for sepsis from sacral ulcer patient agreement this plan.  Consult- Spoke with Dr Waldron [facility] who will admit the patient.  Rule out-I have low suspicion for liver or gallbladder abnormality as she has no right upper quadrant tenderness, no elevation liver enzymes, alk phos, T bili.  Low suspicion for intracranial head bleed and or mass as CT head is negative for acute findings.  Low suspicion for TIA, CVA as she has no focal deficits present my exam, presentation is a tubal etiology, more consistent with bacterial infection.  I have low suspicion for MS flareup as presentation is atypical of etiology.  Plan-admission for continued management of sepsis likely due to sacral ulcer.  Final Clinical Impression(s) / ED Diagnoses Final diagnoses:  Sepsis without acute organ dysfunction, due to unspecified organism [facility])  Skin ulcer of sacrum with fat layer exposed [facility])    Rx / [place] Orders ED  Discharge Orders     None        Marcello Fennel, PA-C 12/10/20 1644    Hayden Rasmussen, MD 12/10/20 Pauline Aus

## 2020-12-10 NOTE — Sepsis Progress Note (Signed)
Code sepsis protocol being monitored by eLink. 

## 2020-12-10 NOTE — ED Notes (Signed)
ED TO INPATIENT HANDOFF REPORT  ED Nurse Name and Phone #:   S Name/Age/Gender Faith [name] 65 y.o. female Room/Bed: APA01/APA01  Code Status   Code Status: Prior  Home/SNF/Other Home Patient oriented to: self, place, time, and situation Is this baseline? Yes   Triage Complete: Triage complete  Chief Complaint Acute osteomyelitis ([employer]) [M86.10]  Triage Note Pt. Arrived via [facility]. Per ems pt. Lost consciousness. EMS states pt was alert when they arrived. Pts. B/P was 80/60 on scene. EMS has given 600 ml of fluid.   Allergies Allergies  Allergen Reactions   Darvon [Propoxyphene] Other (See Comments)    Hallicuations   Oxycodone-Acetaminophen     Does not have any affect on her   Penicillins Rash    Did it involve swelling of the face/tongue/throat, SOB, or low BP? Unknown Did it involve sudden or severe rash/hives, skin peeling, or any reaction on the inside of your mouth or nose? Unknown Did you need to seek medical attention at a hospital or doctor's office? Unknown When did it last happen?      Unknown If all above answers are "NO", may proceed with cephalosporin use.     Level of Care/Admitting Diagnosis ED Disposition     ED Disposition  Admit   Condition  --   [place]: [facility] [[id number]]  Level of Care: Med-Surg [16]  Covid Evaluation: Asymptomatic Screening Protocol (No Symptoms)  Diagnosis: Acute osteomyelitis [facility]) [[id number]]  Admitting Physician: Manfred Shirts  Attending Physician: Waldron Labs, DAWOOD S [[id number]]  Estimated length of stay: past midnight tomorrow  Certification:: I certify this patient will need inpatient services for at least 2 midnights          B Medical/Surgery History Past Medical History:  Diagnosis Date   Anemia    my whole life, Used to take iron   Arthritis    spine, lumbar   COPD (chronic obstructive pulmonary disease) ([place])    Emphysema of lung ([place])    HTN (hypertension)  03/01/2020   Hyperlipidemia 11/28/2019   Narcolepsy    by history, has nightmares   Neuromuscular disorder ([place])    leg issues from spine   Past Surgical History:  Procedure Laterality Date   ANTERIOR CERVICAL DECOMP/DISCECTOMY FUSION N/A 01/19/2019   Procedure: ANTERIOR CERVICAL DECOMPRESSION/DISCECTOMY Cervical three - four;  Surgeon: Ashok Pall, MD;  Location: [place];  Service: Neurosurgery;  Laterality: N/A;   EYE SURGERY Right    ORIF FACIAL FRACTURE     POSTERIOR CERVICAL LAMINECTOMY N/A 01/19/2019   Procedure: POSTERIOR CERVICAL LAMINECTOMY Removal of Synovial Cyst and posterior cervical fusion;  Surgeon: Ashok Pall, MD;  Location: [place];  Service: Neurosurgery;  Laterality: N/A;   TEE WITHOUT CARDIOVERSION N/A 03/17/2020   Procedure: TRANSESOPHAGEAL ECHOCARDIOGRAM (TEE) WITH PROPOFOL;  Surgeon: Satira Sark, MD;  [facility];  Service: Cardiovascular;  Laterality: N/A;   TUBAL LIGATION     tubaligation       A IV Location/Drains/Wounds Patient Lines/Drains/Airways Status     Active Line/Drains/Airways     Name Placement date Placement time Site Days   Peripheral IV 12/10/20 22 G Anterior;Right Forearm 12/10/20  1230  Forearm  less than 1   Peripheral IV 12/10/20 20 G Anterior;Left;Proximal Forearm 12/10/20  1337  Forearm  less than 1   External Urinary Catheter 03/17/20  0900  --  268   Incision (Closed) 01/19/19 Neck 01/19/19  2119  -- 691  Incision (Closed) 01/19/19 Neck 01/19/19  2119  -- 691   Pressure Injury 03/13/20 Sacrum Medial Deep Tissue Pressure Injury - Purple or maroon localized area of discolored intact skin or blood-filled blister due to damage of underlying soft tissue from pressure and/or shear. 6.5 cm x 5.5 cm 03/13/20  1800  -- 272            Intake/Output Last 24 hours  Intake/Output Summary (Last 24 hours) at 12/10/2020 1726 Last data filed at 12/10/2020 1631 Gross per 24 hour  Intake 1100 ml  Output --  Net 1100 ml     Labs/Imaging Results for orders placed or performed during the hospital encounter of 12/10/20 (from the past 48 hour(s))  Resp Panel by RT-PCR (Flu A&B, Covid) Nasopharyngeal Swab     Status: None   Collection Time: 12/10/20  1:03 PM   Specimen: Nasopharyngeal Swab; Nasopharyngeal(NP) swabs in vial transport medium  Result Value Ref Range   SARS Coronavirus 2 by RT PCR NEGATIVE NEGATIVE    Comment: (NOTE) SARS-CoV-2 target nucleic acids are NOT DETECTED.  The SARS-CoV-2 RNA is generally detectable in upper respiratory specimens during the acute phase of infection. The lowest concentration of SARS-CoV-2 viral copies this assay can detect is 138 copies/mL. A negative result does not preclude SARS-Cov-2 infection and should not be used as the sole basis for treatment or other patient management decisions. A negative result may occur with  improper specimen collection/handling, submission of specimen other than nasopharyngeal swab, presence of viral mutation(s) within the areas targeted by this assay, and inadequate number of viral copies(<138 copies/mL). A negative result must be combined with clinical observations, patient history, and epidemiological information. The expected result is Negative.  Fact Sheet for Patients:  EntrepreneurPulse.com.au  Fact Sheet for Healthcare Providers:  IncredibleEmployment.be  This test is no t yet approved or cleared by the [place] FDA and  has been authorized for detection and/or diagnosis of SARS-CoV-2 by FDA under an Emergency Use Authorization (EUA). This EUA will remain  in effect (meaning this test can be used) for the duration of the COVID-19 declaration under Section 564(b)(1) of the Act, 21 U.S.C.section 360bbb-3(b)(1), unless the authorization is terminated  or revoked sooner.       Influenza A by PCR NEGATIVE NEGATIVE   Influenza B by PCR NEGATIVE NEGATIVE    Comment: (NOTE) The Xpert  Xpress SARS-CoV-2/FLU/RSV plus assay is intended as an aid in the diagnosis of influenza from Nasopharyngeal swab specimens and should not be used as a sole basis for treatment. Nasal washings and aspirates are unacceptable for Xpert Xpress SARS-CoV-2/FLU/RSV testing.  Fact Sheet for Patients: EntrepreneurPulse.com.au  Fact Sheet for Healthcare Providers: IncredibleEmployment.be  This test is not yet approved or cleared by the [place] FDA and has been authorized for detection and/or diagnosis of SARS-CoV-2 by FDA under an Emergency Use Authorization (EUA). This EUA will remain in effect (meaning this test can be used) for the duration of the COVID-19 declaration under Section 564(b)(1) of the Act, 21 U.S.C. section 360bbb-3(b)(1), unless the authorization is terminated or revoked.  Performed at [facility], [address], [place], [place] [address]   Comprehensive metabolic panel     Status: Abnormal   Collection Time: 12/10/20  1:36 PM  Result Value Ref Range   Sodium 137 135 - 145 mmol/L   Potassium 4.5 3.5 - 5.1 mmol/L   Chloride 106 98 - 111 mmol/L   CO2 24 22 - 32 mmol/L  Glucose, Bld 97 70 - 99 mg/dL    Comment: Glucose reference range applies only to samples taken after fasting for at least 8 hours.   BUN 13 8 - 23 mg/dL   Creatinine, Ser 0.57 0.44 - 1.00 mg/dL   Calcium 8.7 (L) 8.9 - 10.3 mg/dL   Total Protein 7.0 6.5 - 8.1 g/dL   Albumin 3.4 (L) 3.5 - 5.0 g/dL   AST 17 15 - 41 U/L   ALT 12 0 - 44 U/L   Alkaline Phosphatase 63 38 - 126 U/L   Total Bilirubin 0.5 0.3 - 1.2 mg/dL   GFR, Estimated >60 >60 mL/min    Comment: (NOTE) Calculated using the CKD-EPI Creatinine Equation (2021)    Anion gap 7 5 - 15    Comment: Performed at [facility], [address] [place], [place] [address]  Lactic acid, plasma     Status: None   Collection Time: 12/10/20  1:36 PM  Result Value Ref Range   Lactic Acid, Venous 1.9 0.5 - 1.9  mmol/L    Comment: Performed at [facility], [address], [place], [place] [address]  CBC with Differential     Status: Abnormal   Collection Time: 12/10/20  1:36 PM  Result Value Ref Range   WBC 17.3 (H) 4.0 - 10.5 K/uL   RBC 4.33 3.87 - 5.11 MIL/uL   Hemoglobin 13.2 12.0 - 15.0 g/dL   HCT 40.9 36.0 - 46.0 %   MCV 94.5 80.0 - 100.0 fL   MCH 30.5 26.0 - 34.0 pg   MCHC 32.3 30.0 - 36.0 g/dL   RDW 17.2 (H) 11.5 - 15.5 %   Platelets 286 150 - 400 K/uL   nRBC 0.0 0.0 - 0.2 %   Neutrophils Relative % 79 %   Neutro Abs 13.6 (H) 1.7 - 7.7 K/uL   Lymphocytes Relative 9 %   Lymphs Abs 1.6 0.7 - 4.0 K/uL   Monocytes Relative 9 %   Monocytes Absolute 1.5 (H) 0.1 - 1.0 K/uL   Eosinophils Relative 3 %   Eosinophils Absolute 0.6 (H) 0.0 - 0.5 K/uL   Basophils Relative 0 %   Basophils Absolute 0.1 0.0 - 0.1 K/uL   Immature Granulocytes 0 %   Abs Immature Granulocytes 0.07 0.00 - 0.07 K/uL    Comment: Performed at [facility], [address] [place], [place] [address]  Culture, blood (routine x 2)     Status: None (Preliminary result)   Collection Time: 12/10/20  1:36 PM   Specimen: BLOOD RIGHT HAND  Result Value Ref Range   Specimen Description BLOOD RIGHT HAND    Special Requests      Blood Culture adequate volume BOTTLES DRAWN AEROBIC AND ANAEROBIC Performed at [facility], [address] [place], [place] [address]    Culture PENDING    Report Status PENDING   Culture, blood (routine x 2)     Status: None (Preliminary result)   Collection Time: 12/10/20  1:36 PM   Specimen: BLOOD LEFT HAND  Result Value Ref Range   Specimen Description BLOOD LEFT HAND    Special Requests      Blood Culture results may not be optimal due to an inadequate volume of blood received in culture bottles BOTTLES DRAWN AEROBIC AND ANAEROBIC Performed at [facility], [address], [place], [place] [address]    Culture PENDING    Report Status PENDING   Lactic acid, plasma     Status: None  Collection Time: 12/10/20  4:05 PM  Result Value Ref Range   Lactic Acid, Venous 1.9 0.5 - 1.9 mmol/L    Comment: Performed at [facility], [address], [place], [place] [address]  Protime-INR     Status: None   Collection Time: 12/10/20  4:39 PM  Result Value Ref Range   Prothrombin Time 13.7 11.4 - 15.2 seconds   INR 1.1 0.8 - 1.2    Comment: (NOTE) INR goal varies based on device and disease states. Performed at [facility], [address], [place], [place] [address]   APTT     Status: None   Collection Time: 12/10/20  4:39 PM  Result Value Ref Range   aPTT 30 24 - 36 seconds    Comment: Performed at [facility], [address] [place], [place] [address]   CT Head Wo Contrast  Result Date: 12/10/2020 CLINICAL DATA:  Transient ischemic attack. Positive loss of consciousness. EXAM: CT HEAD WITHOUT CONTRAST TECHNIQUE: Contiguous axial images were obtained from the base of the skull through the vertex without intravenous contrast. COMPARISON:  January 18, 2019.  January 05, 2009. FINDINGS: Brain: Mild diffuse cortical atrophy is noted. Multiple periventricular white matter low densities are noted consistent with history of multiple sclerosis. No mass effect or midline shift is noted. Ventricular size is within normal limits. There is no evidence of mass lesion, hemorrhage or acute infarction. Vascular: No hyperdense vessel or unexpected calcification. Skull: Normal. Negative for fracture or focal lesion. Sinuses/Orbits: No acute finding. Other: None. IMPRESSION: Findings consistent with multiple sclerosis. No acute intracranial abnormality seen. Electronically Signed   By: Marijo Conception M.D.   On: 12/10/2020 14:52   CT ABDOMEN PELVIS W CONTRAST  Result Date: 12/10/2020 CLINICAL DATA:  History of multiple sclerosis with loss of consciousness. EXAM: CT ABDOMEN AND PELVIS WITH CONTRAST TECHNIQUE: Multidetector CT imaging of the abdomen and pelvis was performed using the standard  protocol following bolus administration of intravenous contrast. CONTRAST:  52mL OMNIPAQUE IOHEXOL 300 MG/ML  SOLN COMPARISON:  None. FINDINGS: Lower chest: Mild atelectasis is seen within the bilateral lung bases. Elevation of the left hemidiaphragm is noted. Hepatobiliary: No focal liver abnormality is seen. Multiple gallstones are seen within a moderately distended gallbladder. There is no evidence of gallbladder wall thickening or pericholecystic inflammation. Pancreas: Unremarkable. No pancreatic ductal dilatation or surrounding inflammatory changes. Spleen: Normal in size without focal abnormality. Adrenals/Urinary Tract: There is a 2.0 cm x 1.1 cm low-attenuation left adrenal mass (approximately 80.94 Hounsfield units). The right adrenal gland is normal in appearance. Kidneys are normal in size without focal lesions or obstructing renal calculi. Prominent bilateral extrarenal pelvis are noted. The urinary bladder is moderately distended and otherwise normal in appearance. Stomach/Bowel: Mild thickening of the distal esophagus is seen to the level of the gastroesophageal junction. Stomach is within normal limits. Appendix appears normal. No evidence of bowel wall thickening, distention, or inflammatory changes. Vascular/Lymphatic: There is marked severity calcification and atherosclerosis of the abdominal aorta and bilateral common iliac arteries. Extension to involve the bilateral internal and bilateral external common iliac arteries is noted. No enlarged abdominal or pelvic lymph nodes. Reproductive: Numerous dilated and tortuous vessels are seen along the periphery of an otherwise normal appearing uterus. The bilateral adnexa are unremarkable. Other: A 6.9 cm x 2.7 cm decubitus ulcer is seen along the posterior aspect of the lower coccyx. This extends inferiorly to the level of the rectum (approximately 4.6 cm in length). Cortical destruction involving  lower coccyx cannot be excluded. No abdominopelvic  ascites. Musculoskeletal: Degenerative changes are seen throughout the lumbar spine, most prominent at the level of L4-L5. IMPRESSION: 1. Large decubitus ulcer along the posterior aspect of the lower coccyx, as described above. Associated acute osteomyelitis cannot completely be excluded. Correlation with follow-up MRI is recommended. 2. Cholelithiasis without evidence of acute cholecystitis. 3. Left adrenal mass which may represent adrenal adenoma. Further evaluation with adrenal protocol CT is recommended. 4. Findings suggestive of pelvic congestion syndrome. 5. Degenerative changes of the lumbar spine, most prominent at the level of L4-L5. Aortic Atherosclerosis (ICD10-I70.0). Electronically Signed   By: Virgina Norfolk M.D.   On: 12/10/2020 16:12   DG Chest Portable 1 View  Result Date: 12/10/2020 CLINICAL DATA:  Weakness. EXAM: PORTABLE CHEST 1 VIEW COMPARISON:  March 13, 2020. FINDINGS: The heart size and mediastinal contours are within normal limits. Both lungs are clear. The visualized skeletal structures are unremarkable. IMPRESSION: No active disease. Electronically Signed   By: Marijo Conception M.D.   On: 12/10/2020 15:06    Pending Labs Unresulted Labs (From admission, onward)     Start     Ordered   12/10/20 1452  Urine Culture  (Septic presentation on arrival (screening labs, nursing and treatment orders for obvious sepsis))  ONCE - STAT,   STAT       Question:  Indication  Answer:  Urgency/frequency   12/10/20 1452   12/10/20 1243  Urinalysis, Routine w reflex microscopic  Once,   STAT        12/10/20 1243   Signed and Held  CBC  (heparin)  Once,   R       Comments: Baseline for heparin therapy IF NOT ALREADY DRAWN.  Notify MD if PLT < 100 K.    Signed and Held   Signed and Held  Creatinine, serum  (heparin)  Once,   R       Comments: Baseline for heparin therapy IF NOT ALREADY DRAWN.    Signed and Held   Signed and Held  Basic metabolic panel  Tomorrow morning,   R         Signed and Held   Signed and Held  CBC  Tomorrow morning,   R        Signed and Held            Vitals/Pain Today's Vitals   12/10/20 1430 12/10/20 1500 12/10/20 1600 12/10/20 1630  BP: 90/60 106/80 97/74 103/72  Pulse: 76 69 78 73  Resp: 15 12 14 15   Temp:      TempSrc:      SpO2: 96% 100% 100% 99%  Weight:      Height:      PainSc:        Isolation Precautions No active isolations  Medications Medications  lactated ringers infusion (has no administration in time range)  vancomycin (VANCOCIN) IVPB 1000 mg/200 mL premix (1,000 mg Intravenous New Bag/Given 12/10/20 1635)  vancomycin (VANCOREADY) IVPB 750 mg/150 mL (has no administration in time range)  ceFEPIme (MAXIPIME) 2 g in sodium chloride 0.9 % 100 mL IVPB (has no administration in time range)  atorvastatin (LIPITOR) tablet 20 mg (has no administration in time range)  baclofen (LIORESAL) tablet 10 mg (has no administration in time range)  celecoxib (CELEBREX) capsule 200 mg (has no administration in time range)  cholecalciferol (VITAMIN D3) tablet (has no administration in time range)  diazepam (VALIUM) tablet 2 mg (has no administration  in time range)  gabapentin (NEURONTIN) capsule 600 mg (has no administration in time range)  HYDROcodone-acetaminophen (NORCO/VICODIN) 5-325 MG per tablet 1 tablet (has no administration in time range)  lactated ringers bolus 1,000 mL (0 mLs Intravenous Stopped 12/10/20 1631)  lactated ringers bolus 1,000 mL (1,000 mLs Intravenous New Bag/Given 12/10/20 1640)  iohexol (OMNIPAQUE) 300 MG/ML solution 100 mL (80 mLs Intravenous Contrast Given 12/10/20 1539)    Mobility manual wheelchair High fall risk   Focused Assessments    R Recommendations: See Admitting Provider Note  Report given to:   Additional Notes:

## 2020-12-10 NOTE — ED Triage Notes (Signed)
Pt. Arrived via [facility]. Per ems pt. Lost consciousness. EMS states pt was alert when they arrived. Pts. B/P was 80/60 on scene. EMS has given 600 ml of fluid.

## 2020-12-10 NOTE — Progress Notes (Signed)
Manufacturing engineer [facility])      This patient is active with [facility] for hospice services, with a terminal diagnosis of multiple sclerosis. [facility] will continue to follow for any discharge planning needs and to coordinate continuation of hospice care.    Thank you for the opportunity to participate in this patient's care.     Domenic Moras, BSN, RN [facility] Liaison [phone] (24h on call)

## 2020-12-10 NOTE — ED Notes (Signed)
Attempted report x1. 

## 2020-12-10 NOTE — ED Notes (Signed)
Patient transported to MRI 

## 2020-12-11 DIAGNOSIS — L89154 Pressure ulcer of sacral region, stage 4: Secondary | ICD-10-CM

## 2020-12-11 DIAGNOSIS — M8610 Other acute osteomyelitis, unspecified site: Secondary | ICD-10-CM

## 2020-12-11 DIAGNOSIS — M869 Osteomyelitis, unspecified: Secondary | ICD-10-CM

## 2020-12-11 LAB — BASIC METABOLIC PANEL
Anion gap: 5 (ref 5–15)
BUN: 8 mg/dL (ref 8–23)
CO2: 24 mmol/L (ref 22–32)
Calcium: 7.9 mg/dL — ABNORMAL LOW (ref 8.9–10.3)
Chloride: 109 mmol/L (ref 98–111)
Creatinine, Ser: 0.43 mg/dL — ABNORMAL LOW (ref 0.44–1.00)
GFR, Estimated: >60 mL/min (ref >60)
Glucose, Bld: 81 mg/dL (ref 70–99)
Potassium: 3.6 mmol/L (ref 3.5–5.1)
Sodium: 138 mmol/L (ref 135–145)

## 2020-12-11 LAB — CBC
HCT: 33.9 % — ABNORMAL LOW (ref 36.0–46.0)
Hemoglobin: 10.9 g/dL — ABNORMAL LOW (ref 12.0–15.0)
MCH: 30.4 pg (ref 26.0–34.0)
MCHC: 32.2 g/dL (ref 30.0–36.0)
MCV: 94.7 fL (ref 80.0–100.0)
Platelets: 262 10*3/uL (ref 150–400)
RBC: 3.58 MIL/uL — ABNORMAL LOW (ref 3.87–5.11)
RDW: 17.1 % — ABNORMAL HIGH (ref 11.5–15.5)
WBC: 9.1 10*3/uL (ref 4.0–10.5)
nRBC: 0 % (ref 0.0–0.2)

## 2020-12-11 MED ORDER — SODIUM CHLORIDE 0.9 % IV SOLN
INTRAVENOUS | Status: AC
Start: 2020-12-11 — End: 2020-12-12

## 2020-12-11 MED ORDER — PROSOURCE PLUS PO LIQD
30.0000 mL | Freq: Two times a day (BID) | ORAL | Status: DC
  Administered 2020-12-12 (×2): 30 mL via ORAL
  Filled 2020-12-11 (×2): qty 30

## 2020-12-11 MED ORDER — ENOXAPARIN SODIUM 40 MG/0.4ML IJ SOSY
40.0000 mg | PREFILLED_SYRINGE | INTRAMUSCULAR | Status: DC
  Administered 2020-12-11: 40 mg via SUBCUTANEOUS
  Filled 2020-12-11: qty 0.4

## 2020-12-11 MED ORDER — ENSURE ENLIVE PO LIQD
237.0000 mL | Freq: Three times a day (TID) | ORAL | Status: DC
  Administered 2020-12-11 – 2020-12-12 (×3): 237 mL via ORAL

## 2020-12-11 NOTE — Progress Notes (Signed)
PROGRESS NOTE  Faith [name] SEG:[id number] DOB: 15-Dec-1955 DOA: 12/10/2020 PCP: Pcp, No  HPI/Recap of past 24 hours: Faith [name] is a 65 y.o. female, with past medical history of HTN, multiple sclerosis, wheelchair dependent, pressure ulcer in the right buttock, presents to ED with complaints of syncopal episodes, generalized weakness. At baseline she is able to assist herself from bed to chair. Patient reports that she has a pressure ulcer, where she has a visiting nurse, doing daily dressing. In the ED, noted to be hypothermic 96 Fahrenheit rectal temperature, work-up significant for leukocytosis at 17.3, CT abdomen pelvis significant for acute osteomyelitis at her pressure ulcer area. [facility] hospitalist consulted to admit.    Today, patient reports having chills, denies any fever, chest pain, shortness of breath, abdominal pain, nausea/vomiting.   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, medial right gluteal abscess Currently afebrile, with resolved leukocytosis BC x2 NGTD MRI of the pelvis confirmed coccygeal osteomyelitis as well as right gluteal abscess General surgery consulted Continue empirically with IV vancomycin and cefepime for now IV fluids Wound care   ??Syncope/generalized weakness Likely related to infectious process Management as above Telemetry   Multiple sclerosis Wheelchair-bound Continue with home medications including baclofen, aspirin, Celebrex, Valium as needed, gabapentin and as needed Vicodin   Hyperlipidemia Continue with statin   Hypertension On low-dose propranolol, will resume when more stable   Left adrenal mass Incidental findings of 2 cm x 1.1 cm left adrenal mass, work-up can be pursued as an outpatient including an  adrenal protocol CT   Goals of care Followed by home hospice Palliative consult      Estimated body mass index is 17.41 kg/m as calculated from the following:   Height as of this encounter: 5\' 4"  (1.626 m).   Weight as of this encounter: 46 kg.     Code Status: Full  Family Communication: None at bedside  Disposition Plan: Status is: Inpatient  Remains inpatient appropriate because: Level of care  Dispo: The patient is from: Home              Anticipated d/c is to: Home              Patient currently is not medically stable to d/c.   Difficult to place patient No    Consultants: General surgery  Procedures: None  Antimicrobials: Cefepime Vancomycin  DVT prophylaxis: Lovenox   Objective: Vitals:   12/10/20 2133 12/11/20 0040 12/11/20 0212 12/11/20 0329  BP: 117/77 96/62 127/84 110/73  Pulse: 87 70 78 76  Resp:  18 18 19   Temp:  97.8 F (36.6 C) 97.8 F (36.6 C) 98 F (36.7 C)  TempSrc:   Oral   SpO2: 97% 100% 96% 95%  Weight:      Height:        Intake/Output Summary (Last 24 hours) at 12/11/2020 1234 Last data filed at 12/11/2020 0900 Gross per 24 hour  Intake 2706.37 ml  Output 1300 ml  Net 1406.37 ml   Filed Weights   12/10/20 1137 12/10/20 1900  Weight: 44.4 kg 46 kg    Exam: General: NAD  Cardiovascular: S1, S2 present Respiratory: CTAB Abdomen: Soft, nontender, nondistended, bowel sounds present Musculoskeletal: No bilateral pedal edema noted Skin: Noted unstageable large right gluteal decubitus ulcer, with noted  drainage Psychiatry: Normal mood     Data Reviewed: CBC: Recent Labs  Lab 12/10/20 1336 12/11/20 0544  WBC 17.3* 9.1  NEUTROABS 13.6*  --   HGB 13.2 10.9*  HCT 40.9 33.9*  MCV 94.5 94.7  PLT 286 858   Basic Metabolic Panel: Recent Labs  Lab 12/10/20 1336 12/11/20 0544  NA 137 138  K 4.5 3.6  CL 106 109  CO2 24 24  GLUCOSE 97 81  BUN 13 8  CREATININE 0.57 0.43*  CALCIUM 8.7* 7.9*    GFR: Estimated Creatinine Clearance: 51.6 mL/min (A) (by C-G formula based on SCr of 0.43 mg/dL (L)). Liver Function Tests: Recent Labs  Lab 12/10/20 1336  AST 17  ALT 12  ALKPHOS 63  BILITOT 0.5  PROT 7.0  ALBUMIN 3.4*   No results for input(s): LIPASE, AMYLASE in the last 168 hours. No results for input(s): AMMONIA in the last 168 hours. Coagulation Profile: Recent Labs  Lab 12/10/20 1639  INR 1.1   Cardiac Enzymes: No results for input(s): CKTOTAL, CKMB, CKMBINDEX, TROPONINI in the last 168 hours. BNP (last 3 results) No results for input(s): PROBNP in the last 8760 hours. HbA1C: No results for input(s): HGBA1C in the last 72 hours. CBG: No results for input(s): GLUCAP in the last 168 hours. Lipid Profile: No results for input(s): CHOL, HDL, LDLCALC, TRIG, CHOLHDL, LDLDIRECT in the last 72 hours. Thyroid Function Tests: No results for input(s): TSH, T4TOTAL, FREET4, T3FREE, THYROIDAB in the last 72 hours. Anemia Panel: No results for input(s): VITAMINB12, FOLATE, FERRITIN, TIBC, IRON, RETICCTPCT in the last 72 hours. Urine analysis:    Component Value Date/Time   COLORURINE AMBER (A) 03/13/2020 1020   APPEARANCEUR HAZY (A) 03/13/2020 1020   LABSPEC 1.017 03/13/2020 1020   PHURINE 6.0 03/13/2020 1020   GLUCOSEU NEGATIVE 03/13/2020 1020   HGBUR NEGATIVE 03/13/2020 1020   BILIRUBINUR NEGATIVE 03/13/2020 1020   KETONESUR 20 (A) 03/13/2020 1020   PROTEINUR 100 (A) 03/13/2020 1020   NITRITE NEGATIVE 03/13/2020 1020   LEUKOCYTESUR NEGATIVE 03/13/2020 1020   Sepsis Labs: @LABRCNTIP (procalcitonin:4,lacticidven:4)  ) Recent Results (from the past 240 hour(s))  Resp Panel by RT-PCR (Flu A&B, Covid) Nasopharyngeal Swab     Status: None   Collection Time: 12/10/20  1:03 PM   Specimen: Nasopharyngeal Swab; Nasopharyngeal(NP) swabs in vial transport medium  Result Value Ref Range Status   SARS Coronavirus 2 by RT PCR NEGATIVE NEGATIVE Final    Comment:  (NOTE) SARS-CoV-2 target nucleic acids are NOT DETECTED.  The SARS-CoV-2 RNA is generally detectable in upper respiratory specimens during the acute phase of infection. The lowest concentration of SARS-CoV-2 viral copies this assay can detect is 138 copies/mL. A negative result does not preclude SARS-Cov-2 infection and should not be used as the sole basis for treatment or other patient management decisions. A negative result may occur with  improper specimen collection/handling, submission of specimen other than nasopharyngeal swab, presence of viral mutation(s) within the areas targeted by this assay, and inadequate number of viral copies(<138 copies/mL). A negative result must be combined with clinical observations, patient history, and epidemiological information. The expected result is Negative.  Fact Sheet for Patients:  EntrepreneurPulse.com.au  Fact Sheet for Healthcare Providers:  IncredibleEmployment.be  This test is no t yet approved or cleared by the [place] FDA and  has been authorized for detection and/or diagnosis of SARS-CoV-2 by FDA under an Emergency Use Authorization (EUA). This EUA will remain  in effect (meaning this test  can be used) for the duration of the COVID-19 declaration under Section 564(b)(1) of the Act, 21 U.S.C.section 360bbb-3(b)(1), unless the authorization is terminated  or revoked sooner.       Influenza A by PCR NEGATIVE NEGATIVE Final   Influenza B by PCR NEGATIVE NEGATIVE Final    Comment: (NOTE) The Xpert Xpress SARS-CoV-2/FLU/RSV plus assay is intended as an aid in the diagnosis of influenza from Nasopharyngeal swab specimens and should not be used as a sole basis for treatment. Nasal washings and aspirates are unacceptable for Xpert Xpress SARS-CoV-2/FLU/RSV testing.  Fact Sheet for Patients: EntrepreneurPulse.com.au  Fact Sheet for Healthcare  Providers: IncredibleEmployment.be  This test is not yet approved or cleared by the [place] FDA and has been authorized for detection and/or diagnosis of SARS-CoV-2 by FDA under an Emergency Use Authorization (EUA). This EUA will remain in effect (meaning this test can be used) for the duration of the COVID-19 declaration under Section 564(b)(1) of the Act, 21 U.S.C. section 360bbb-3(b)(1), unless the authorization is terminated or revoked.  Performed at [facility], [address] [place], [place] [address]   Culture, blood (routine x 2)     Status: None (Preliminary result)   Collection Time: 12/10/20  1:36 PM   Specimen: BLOOD RIGHT HAND  Result Value Ref Range Status   Specimen Description BLOOD RIGHT HAND  Final   Special Requests   Final    Blood Culture adequate volume BOTTLES DRAWN AEROBIC AND ANAEROBIC   Culture   Final    NO GROWTH < 24 HOURS Performed at [facility], [address], [place], [place] [address]    Report Status PENDING  Incomplete  Culture, blood (routine x 2)     Status: None (Preliminary result)   Collection Time: 12/10/20  1:36 PM   Specimen: BLOOD LEFT HAND  Result Value Ref Range Status   Specimen Description BLOOD LEFT HAND  Final   Special Requests   Final    Blood Culture results may not be optimal due to an inadequate volume of blood received in culture bottles BOTTLES DRAWN AEROBIC AND ANAEROBIC   Culture   Final    NO GROWTH < 24 HOURS Performed at [facility], [address], [place], [place] [address]    Report Status PENDING  Incomplete      Studies: CT Head Wo Contrast  Result Date: 12/10/2020 CLINICAL DATA:  Transient ischemic attack. Positive loss of consciousness. EXAM: CT HEAD WITHOUT CONTRAST TECHNIQUE: Contiguous axial images were obtained from the base of the skull through the vertex without intravenous contrast. COMPARISON:  January 18, 2019.  January 05, 2009. FINDINGS: Brain: Mild diffuse cortical  atrophy is noted. Multiple periventricular white matter low densities are noted consistent with history of multiple sclerosis. No mass effect or midline shift is noted. Ventricular size is within normal limits. There is no evidence of mass lesion, hemorrhage or acute infarction. Vascular: No hyperdense vessel or unexpected calcification. Skull: Normal. Negative for fracture or focal lesion. Sinuses/Orbits: No acute finding. Other: None. IMPRESSION: Findings consistent with multiple sclerosis. No acute intracranial abnormality seen. Electronically Signed   By: Marijo Conception M.D.   On: 12/10/2020 14:52   MR PELVIS W WO CONTRAST  Result Date: 12/10/2020 CLINICAL DATA:  65 year old female with history of decubitus ulcer of the right buttock region. Evaluate for osteomyelitis. EXAM: MRI PELVIS WITHOUT AND WITH CONTRAST TECHNIQUE: Multiplanar multisequence MR imaging of the pelvis was performed both before and after administration of intravenous  contrast. CONTRAST:  4.63mL GADAVIST GADOBUTROL 1 MMOL/ML IV SOLN COMPARISON:  No prior pelvic MRI. CT the abdomen and pelvis 12/10/2020. FINDINGS: Urinary Tract: Visualized portions of distal ureters and urinary bladder unremarkable. Bowel:  Unremarkable visualized pelvic bowel loops. Vascular/Lymphatic: No pathologically enlarged lymph nodes. No significant vascular abnormality seen. Reproductive:  Uterus and ovaries are unremarkable in appearance. Other: No significant volume of ascites noted in the visualized portions of the low anatomic pelvis. Musculoskeletal: Large area of abnormal peripheral enhancement in the low anatomic pelvis predominantly in the medial aspect of the right gluteal region involving the subcutaneous fat overall measuring approximately 7.8 x 3.3 x 6.4 cm (axial image 39 of series 10 and coronal image 8 of series 11). This region demonstrates complex internal signal characteristics, including some signal void which corresponds to gas within the  lesion seen on the prior CT examination, compatible with a deep decubitus ulcer and associated gluteal abscess. Some of the abnormal enhancing soft tissue also extends across the midline to involve the medial aspect of the left gluteal region superficially. The most cephalad extent of the enhancing soft tissue comes in close proximity to the inferior aspect of the sacrum and the adjacent coccyx. T2 weighted images demonstrate no definitive increased T2 signal intensity within the sacrum, although there does appear to be some patchy increased T2 signal intensity within the coccyx where there is also some very mild post gadolinium enhancement, concerning for osteomyelitis. IMPRESSION: 1. 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 Electronically Signed   By: Vinnie Langton M.D.   On: 12/10/2020 19:18   CT ABDOMEN PELVIS W CONTRAST  Result Date: 12/10/2020 CLINICAL DATA:  History of multiple sclerosis with loss of consciousness. EXAM: CT ABDOMEN AND PELVIS WITH CONTRAST TECHNIQUE: Multidetector CT imaging of the abdomen and pelvis was performed using the standard protocol following bolus administration of intravenous contrast. CONTRAST:  1mL OMNIPAQUE IOHEXOL 300 MG/ML  SOLN COMPARISON:  None. FINDINGS: Lower chest: Mild atelectasis is seen within the bilateral lung bases. Elevation of the left hemidiaphragm is noted. Hepatobiliary: No focal liver abnormality is seen. Multiple gallstones are seen within a moderately distended gallbladder. There is no evidence of gallbladder wall thickening or pericholecystic inflammation. Pancreas: Unremarkable. No pancreatic ductal dilatation or surrounding inflammatory changes. Spleen: Normal in size without focal abnormality. Adrenals/Urinary Tract: There is a 2.0 cm x 1.1 cm low-attenuation left adrenal mass (approximately 80.94 Hounsfield units). The right  adrenal gland is normal in appearance. Kidneys are normal in size without focal lesions or obstructing renal calculi. Prominent bilateral extrarenal pelvis are noted. The urinary bladder is moderately distended and otherwise normal in appearance. Stomach/Bowel: Mild thickening of the distal esophagus is seen to the level of the gastroesophageal junction. Stomach is within normal limits. Appendix appears normal. No evidence of bowel wall thickening, distention, or inflammatory changes. Vascular/Lymphatic: There is marked severity calcification and atherosclerosis of the abdominal aorta and bilateral common iliac arteries. Extension to involve the bilateral internal and bilateral external common iliac arteries is noted. No enlarged abdominal or pelvic lymph nodes. Reproductive: Numerous dilated and tortuous vessels are seen along the periphery of an otherwise normal appearing uterus. The bilateral adnexa are unremarkable. Other: A 6.9 cm x 2.7 cm decubitus ulcer is seen along the posterior aspect of the lower coccyx. This extends inferiorly to the level of the rectum (approximately 4.6 cm in length). Cortical destruction involving lower coccyx  cannot be excluded. No abdominopelvic ascites. Musculoskeletal: Degenerative changes are seen throughout the lumbar spine, most prominent at the level of L4-L5. IMPRESSION: 1. Large decubitus ulcer along the posterior aspect of the lower coccyx, as described above. Associated acute osteomyelitis cannot completely be excluded. Correlation with follow-up MRI is recommended. 2. Cholelithiasis without evidence of acute cholecystitis. 3. Left adrenal mass which may represent adrenal adenoma. Further evaluation with adrenal protocol CT is recommended. 4. Findings suggestive of pelvic congestion syndrome. 5. Degenerative changes of the lumbar spine, most prominent at the level of L4-L5. Aortic Atherosclerosis (ICD10-I70.0). Electronically Signed   By: Virgina Norfolk M.D.   On:  12/10/2020 16:12   DG Chest Portable 1 View  Result Date: 12/10/2020 CLINICAL DATA:  Weakness. EXAM: PORTABLE CHEST 1 VIEW COMPARISON:  March 13, 2020. FINDINGS: The heart size and mediastinal contours are within normal limits. Both lungs are clear. The visualized skeletal structures are unremarkable. IMPRESSION: No active disease. Electronically Signed   By: Marijo Conception M.D.   On: 12/10/2020 15:06    Scheduled Meds:  atorvastatin  20 mg Oral Daily   baclofen  10 mg Oral TID   celecoxib  200 mg Oral BID   cholecalciferol   Oral Daily   gabapentin  600 mg Oral TID   heparin  5,000 Units Subcutaneous Q8H    Continuous Infusions:  ceFEPime (MAXIPIME) IV 2 g (12/11/20 8921)   vancomycin       LOS: 1 day     Alma Friendly, MD [facility]  If 7PM-7AM, please contact night-coverage [email or web address] 12/11/2020, 12:34 PM

## 2020-12-11 NOTE — Progress Notes (Signed)
Initial Nutrition Assessment  DOCUMENTATION CODES:   Underweight  INTERVENTION:  Ensure Enlive po BID, each supplement provides 350 kcal and 20 grams of protein   ProSource Plus 30 ml TID (each 30 ml provides 100 kcal, 15 gr protein)   Recommend OT assessment -(weighted utensils)  NUTRITION DIAGNOSIS:   Increased nutrient needs related to wound healing as evidenced by estimated needs.   GOAL:  Patient will meet greater than or equal to 90% of their needs   MONITOR:  Supplement acceptance, Skin, Weight trends, Labs, PO intake  REASON FOR ASSESSMENT:   Low [place]    ASSESSMENT: RD working remotely.  Patient is a 65 yo female with hx of MS (wheelchair bound), COPD, chronic unstageable right gluteal pressure ulcer.  CT- concern for coccygeal osteomyelitis with large deep decubitus ulcer in the gluteal region, with medial right gluteal abscess  Talked with patient by telephone. Home diet regular. She eats 2 "good" meals daily and then drinks a boost or ensure at breakfast. Able to feed herself with weighted utensils at home. Endorsed good appetite.   Last MD visit pt reports wt of 101 lb (45.9 kg). Review of hospital records reflects same range  +/- 2 kg for past 15 months.  Medications reviewed and include: vitamin D3, Lipitor, baclofen  IVF- NS @ 75 ml/hr.   Labs: BMP Latest Ref Rng & Units 12/11/2020 12/10/2020 03/31/2020  Glucose 70 - 99 mg/dL 81 97 -  BUN 8 - 23 mg/dL 8 13 -  Creatinine 0.44 - 1.00 mg/dL 0.43(L) 0.57 0.38(L)  BUN/Creat Ratio 6 - 22 (calc) - - -  Sodium 135 - 145 mmol/L 138 137 -  Potassium 3.5 - 5.1 mmol/L 3.6 4.5 -  Chloride 98 - 111 mmol/L 109 106 -  CO2 22 - 32 mmol/L 24 24 -  Calcium 8.9 - 10.3 mg/dL 7.9(L) 8.7(L) -      NUTRITION - FOCUSED PHYSICAL EXAM: Unable to complete Nutrition-Focused physical exam at this time.  RD working remotely   Diet Order:   Diet Order             Diet regular Room service appropriate? Yes; Fluid  consistency: Thin  Diet effective now                   EDUCATION NEEDS:  Education needs have been addressed  Skin:  Skin Assessment: Skin Integrity Issues: Skin Integrity Issues:: Unstageable Unstageable: right buttocck  Last BM:  10/12  Height:   Ht Readings from Last 1 Encounters:  12/10/20 5\' 4"  (1.626 m)    Weight:   Wt Readings from Last 1 Encounters:  12/10/20 46 kg    Ideal Body Weight:   55 kg  BMI:  Body mass index is 17.41 kg/m.  Estimated Nutritional Needs:   Kcal:  1700-1800  Protein:  82-89 gr  Fluid:  >1300 ml daily   Colman Cater MS,RD,CSG,LDN Contact:AMION
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
## 2020-12-11 NOTE — Consult Note (Addendum)
[facility] Nurse Consult Note: Reason for Consult:Consult requested for right buttock.  Performed remotely after review of progress notes and photos in the EMR.   According to Ct scan results, "Findings are concerning for coccygeal osteomyelitis with large deep decubitus ulcer in the gluteal region, with medial right gluteal abscess." This complex medical condition is beyond the scope of practice for [facility] nurses, and a surgical consult has been requested by the primary team and is pending. Please refer to their team for further plan of care. Wound type: Chronic Unstageable pressure injury to right buttock with large amt slough and green-yellow drainage.  Pressure Injury POA: Yes Dressing procedure/placement/frequency: Topical treatment orders provided for bedside nurses to perform as follows until further orders are available from the surgical team: Apply moist gauze to right buttock Q day, then cover with ABD pad and tape. Please re-consult if further assistance is needed.  Thank-you,  Julien Girt MSN, [place], [place], [place], [place]

## 2020-12-11 NOTE — Progress Notes (Signed)
Faith [name] Room [address] [facility]) Hospitalized Hospice Patient  Faith [name] is a current hospice patient with a terminal diagnosis of multiple sclerosis per Dr. Karie Georges, [facility] MD. On 12/10/20, Faith [name] began to feel bad, called for her son in the other room, he reported seizure type activity and unresponsiveness. EMS was activated and the decision was made to send her to the ED for further evaluation. She is admitted for work up of suspected osteomyelitis and syncopal episode, this is a related hospital admission per Dr. Karie Georges, [facility] MD.  Faith [name] report with nursing staff. Visited patient at the bedside, daughter present during visit. Faith [name] is alert and oriented, reports she is feeling better and is eager to return home. They report that surgery consulted but did not feel there was need for surgical intervention at this time. Initial imaging, which suggested osteomyelitis, was not apparent during visual exam by the surgeon.  Faith [name] remains inpatient appropriate for treatment of suspected osteomyelitis, requiring IV antibiotics and fluids.   V/S: 98 oral, 110/73, HR 76, RR 19, SPO2 95% on RA I&O: 2226/400 Labs: WBCs 17.3 on admission, today 9.1 Imaging: MRI (10/12) - Findings are concerning for coccygeal osteomyelitis with large deep decubitus ulcer in the gluteal region, with medial right gluteal abscess IV/PRN: NS continuous @ 75 ml/hr, maxipeme 2 g IV BID, LR 1 liter bolus IV x 1, vancomycin 1 g IV x 1, vancomycin 750 mg IV x 1, valium 2 mg IV x 1, hydrocodone 5-325 mg PO x 2   Problem List: - Suspected osteomyelitis of coccyx - IV antibiotics, surgery consulted and per family will not be requiring surgery  - Seizure activity - no hx of seizure disorder, suspected syncopal episode, alert and oriented to her baseline today  - Multiple sclerosis - under hospice services for this. Valium as needed for muscle spasms.  - Left adrenal mass - incidental finding, further work  up will be dependent upon what Faith [name] chooses to do once she is discharged from the hospital   [place]: Ongoing. She is under hospice services, remains a full code. D/C planning: Return back home with her son and hospice support. She will need ambulance transportation back home. IDT: Hospice team updated. Family: Daughter at the bedside.  Transfer summary and med list to shadow chart.  Thank you, Venia Carbon RN, BSN, [facility] Liaison

## 2020-12-11 NOTE — Consult Note (Signed)
[facility] Consult  Reason for Consult: Sacral decubitus ulcer, osteo Referring Physician: Dr. Horris Latino   Chief Complaint   Loss of Consciousness     HPI: Faith [name] is a 65 y.o. female with HTN, MS, known pressure ulcer who lives at home and reportedly was very week and lost consciousness. She has been having a RN help her change her packing daily she reports. She is wheelchair bound. In the ED she was hypothermic and had a pelvic CT with concern for right g luteal abscess associated with her pressure sore and a leukocytosis. She had an MRI that demonstrated osteo of the coccygeal area.   She was admitted for further workup.   Past Medical History:  Diagnosis Date  . Anemia    my whole life, Used to take iron  . Arthritis    spine, lumbar  . COPD (chronic obstructive pulmonary disease) ([place])   . Emphysema of lung ([place])   . HTN (hypertension) 03/01/2020  . Hyperlipidemia 11/28/2019  . Narcolepsy    by history, has nightmares  . Neuromuscular disorder ([place])    leg issues from spine    Past Surgical History:  Procedure Laterality Date  . ANTERIOR CERVICAL DECOMP/DISCECTOMY FUSION N/A 01/19/2019   Procedure: ANTERIOR CERVICAL DECOMPRESSION/DISCECTOMY Cervical three - four;  Surgeon: Ashok Pall, MD;  Location: [place];  Service: Neurosurgery;  Laterality: N/A;  . EYE SURGERY Right   . ORIF FACIAL FRACTURE    . POSTERIOR CERVICAL LAMINECTOMY N/A 01/19/2019   Procedure: POSTERIOR CERVICAL LAMINECTOMY Removal of Synovial Cyst and posterior cervical fusion;  Surgeon: Ashok Pall, MD;  Location: [place];  Service: Neurosurgery;  Laterality: N/A;  . TEE WITHOUT CARDIOVERSION N/A 03/17/2020   Procedure: TRANSESOPHAGEAL ECHOCARDIOGRAM (TEE) WITH PROPOFOL;  Surgeon: Satira Sark, MD;  [facility];  Service: Cardiovascular;  Laterality: N/A;  . TUBAL LIGATION    . tubaligation      Family History  Problem Relation Age of Onset  . Heart attack  Mother   . Kidney failure Mother   . Diabetes Mother   . Heart disease Mother   . Hyperlipidemia Mother   . Hypertension Mother   . Heart attack Father 3  . Hypertension Father   . Hypertension Sister   . Diabetes Sister   . Other Brother        house fire  . Hypertension Brother   . Seizures Brother   . Heart disease Brother        mitral valve  . Arthritis Daughter        DJD, AS fibromyalgia  . Polymyositis Daughter   . Cancer Maternal Grandfather        lung  . Heart disease Maternal Grandfather   . Other Paternal Grandmother        house fire  . Alcohol abuse Son     Social History   Tobacco Use  . Smoking status: Every Day    Packs/day: 0.25    Years: 45.00    Pack years: 11.25    Types: Cigarettes  . Smokeless tobacco: Never  . Tobacco comments:    cutting down  Vaping Use  . Vaping Use: Never used  Substance Use Topics  . Alcohol use: Yes    Alcohol/week: 2.0 standard drinks    Types: 2 Cans of beer per week  . Drug use: No    Medications: I have reviewed the patient's current medications. Prior to Admission:  Medications Prior to  Admission  Medication Sig Dispense Refill Last Dose  . baclofen (LIORESAL) 10 MG tablet Take 1 tablet (10 mg total) by mouth 3 (three) times daily. 90 each 11 12/10/2020  . celecoxib (CELEBREX) 200 MG capsule Take 1 capsule (200 mg total) by mouth 2 (two) times daily. 60 capsule 2 12/10/2020  . Cholecalciferol 50 MCG (2000 UT) TABS Take 2,000 Int'l Units by mouth daily.   12/10/2020  . diazepam (VALIUM) 5 MG tablet TAKE (1) TABLET BY MOUTH EVERY SIX HOURS AS NEEDED FOR MUSCLE SPASMS. 90 tablet 5 Past Week  . gabapentin (NEURONTIN) 300 MG capsule Take 2 capsules (600 mg total) by mouth 3 (three) times daily. 180 capsule 1 12/10/2020  . HYDROcodone-acetaminophen (NORCO/VICODIN) 5-325 MG tablet Take 1 tablet by mouth every 6 (six) hours as needed for moderate pain. 60 tablet 0 12/10/2020  . ibuprofen (ADVIL) 800 MG tablet Take  800 mg by mouth every 8 (eight) hours as needed for fever, headache or mild pain.   PRN  . metroNIDAZOLE (FLAGYL) 500 MG tablet 500 mg daily. Topical for wound   12/09/2020  . propranolol (INDERAL) 40 MG tablet Take 1 tablet (40 mg total) by mouth 2 (two) times daily. 60 tablet 3 12/10/2020 at 0800  . atorvastatin (LIPITOR) 20 MG tablet Take 1 tablet (20 mg total) by mouth daily. (Patient not taking: Reported on 12/10/2020) 90 tablet 0 Not Taking  . linezolid (ZYVOX) 600 MG tablet Take 1 tablet (600 mg total) by mouth 2 (two) times daily. (Patient not taking: Reported on 12/10/2020) 20 tablet 0 Not Taking   Scheduled: . [START ON 12/12/2020] (feeding supplement) PROSource Plus  30 mL Oral BID BM  . atorvastatin  20 mg Oral Daily  . baclofen  10 mg Oral TID  . celecoxib  200 mg Oral BID  . cholecalciferol   Oral Daily  . [START ON 12/12/2020] enoxaparin (LOVENOX) injection  40 mg Subcutaneous Q24H  . feeding supplement  237 mL Oral TID BM  . gabapentin  600 mg Oral TID   Continuous: . sodium chloride 75 mL/hr at 12/11/20 1331  . ceFEPime (MAXIPIME) IV 2 g (12/11/20 4097)  . vancomycin 750 mg (12/11/20 1753)   DZH:[id number] **OR** acetaminophen, diazepam, HYDROcodone-acetaminophen  Allergies  Allergen Reactions  . Darvon [Propoxyphene] Other (See Comments)    Hallicuations  . Oxycodone-Acetaminophen     Does not have any affect on her  . Penicillins Rash    Did it involve swelling of the face/tongue/throat, SOB, or low BP? Unknown Did it involve sudden or severe rash/hives, skin peeling, or any reaction on the inside of your mouth or nose? Unknown Did you need to seek medical attention at a hospital or doctor's office? Unknown When did it last happen?      Unknown If all above answers are "NO", may proceed with cephalosporin use.      ROS:  A comprehensive review of systems was negative except for: Constitutional: positive for malaise and weakness, syncope Musculoskeletal:  positive for known sacral decub  Blood pressure 110/73, pulse 76, temperature 98 F (36.7 C), resp. rate 19, height 5\' 4"  (1.626 m), weight 46 kg, SpO2 95 %. Physical Exam Vitals reviewed.  HENT:     Head: Normocephalic.     Nose: Nose normal.  Eyes:     Extraocular Movements: Extraocular movements intact.  Cardiovascular:     Rate and Rhythm: Normal rate.  Pulmonary:     Effort: Pulmonary effort is normal.  Abdominal:  General: There is no distension.     Palpations: Abdomen is soft.  Musculoskeletal:        General: No swelling.     Comments: Sacral region with 5cm skin loss, stage IV decubitus down to the bone with minimal fibrinous tissue, edematous muscle and undermining 4cm superior and 6cm inferior, area corresponding to fluid pocket/ abscess probed with finger and no drainage or signs of active infection  Skin:    General: Skin is warm.  Neurological:     General: No focal deficit present.     Mental Status: She is alert and oriented to person, place, and time.  Psychiatric:        Mood and Affect: Mood normal.       Results: Results for orders placed or performed during the hospital encounter of 12/10/20 (from the past 48 hour(s))  Resp Panel by RT-PCR (Flu A&B, Covid) Nasopharyngeal Swab     Status: None   Collection Time: 12/10/20  1:03 PM   Specimen: Nasopharyngeal Swab; Nasopharyngeal(NP) swabs in vial transport medium  Result Value Ref Range   SARS Coronavirus 2 by RT PCR NEGATIVE NEGATIVE    Comment: (NOTE) SARS-CoV-2 target nucleic acids are NOT DETECTED.  The SARS-CoV-2 RNA is generally detectable in upper respiratory specimens during the acute phase of infection. The lowest concentration of SARS-CoV-2 viral copies this assay can detect is 138 copies/mL. A negative result does not preclude SARS-Cov-2 infection and should not be used as the sole basis for treatment or other patient management decisions. A negative result may occur with  improper  specimen collection/handling, submission of specimen other than nasopharyngeal swab, presence of viral mutation(s) within the areas targeted by this assay, and inadequate number of viral copies(<138 copies/mL). A negative result must be combined with clinical observations, patient history, and epidemiological information. The expected result is Negative.  Fact Sheet for Patients:  EntrepreneurPulse.com.au  Fact Sheet for Healthcare Providers:  IncredibleEmployment.be  This test is no t yet approved or cleared by the [place] FDA and  has been authorized for detection and/or diagnosis of SARS-CoV-2 by FDA under an Emergency Use Authorization (EUA). This EUA will remain  in effect (meaning this test can be used) for the duration of the COVID-19 declaration under Section 564(b)(1) of the Act, 21 U.S.C.section 360bbb-3(b)(1), unless the authorization is terminated  or revoked sooner.       Influenza A by PCR NEGATIVE NEGATIVE   Influenza B by PCR NEGATIVE NEGATIVE    Comment: (NOTE) The Xpert Xpress SARS-CoV-2/FLU/RSV plus assay is intended as an aid in the diagnosis of influenza from Nasopharyngeal swab specimens and should not be used as a sole basis for treatment. Nasal washings and aspirates are unacceptable for Xpert Xpress SARS-CoV-2/FLU/RSV testing.  Fact Sheet for Patients: EntrepreneurPulse.com.au  Fact Sheet for Healthcare Providers: IncredibleEmployment.be  This test is not yet approved or cleared by the [place] FDA and has been authorized for detection and/or diagnosis of SARS-CoV-2 by FDA under an Emergency Use Authorization (EUA). This EUA will remain in effect (meaning this test can be used) for the duration of the COVID-19 declaration under Section 564(b)(1) of the Act, 21 U.S.C. section 360bbb-3(b)(1), unless the authorization is terminated or revoked.  Performed at [facility], [address] [place], [place] [address]   Comprehensive metabolic panel     Status: Abnormal   Collection Time: 12/10/20  1:36 PM  Result Value Ref Range   Sodium 137 135 - 145 mmol/L  Potassium 4.5 3.5 - 5.1 mmol/L   Chloride 106 98 - 111 mmol/L   CO2 24 22 - 32 mmol/L   Glucose, Bld 97 70 - 99 mg/dL    Comment: Glucose reference range applies only to samples taken after fasting for at least 8 hours.   BUN 13 8 - 23 mg/dL   Creatinine, Ser 0.57 0.44 - 1.00 mg/dL   Calcium 8.7 (L) 8.9 - 10.3 mg/dL   Total Protein 7.0 6.5 - 8.1 g/dL   Albumin 3.4 (L) 3.5 - 5.0 g/dL   AST 17 15 - 41 U/L   ALT 12 0 - 44 U/L   Alkaline Phosphatase 63 38 - 126 U/L   Total Bilirubin 0.5 0.3 - 1.2 mg/dL   GFR, Estimated >60 >60 mL/min    Comment: (NOTE) Calculated using the CKD-EPI Creatinine Equation (2021)    Anion gap 7 5 - 15    Comment: Performed at [facility], [address], [place], [place] [address]  Lactic acid, plasma     Status: None   Collection Time: 12/10/20  1:36 PM  Result Value Ref Range   Lactic Acid, Venous 1.9 0.5 - 1.9 mmol/L    Comment: Performed at [facility], [address], [place], [place] [address]  CBC with Differential     Status: Abnormal   Collection Time: 12/10/20  1:36 PM  Result Value Ref Range   WBC 17.3 (H) 4.0 - 10.5 K/uL   RBC 4.33 3.87 - 5.11 MIL/uL   Hemoglobin 13.2 12.0 - 15.0 g/dL   HCT 40.9 36.0 - 46.0 %   MCV 94.5 80.0 - 100.0 fL   MCH 30.5 26.0 - 34.0 pg   MCHC 32.3 30.0 - 36.0 g/dL   RDW 17.2 (H) 11.5 - 15.5 %   Platelets 286 150 - 400 K/uL   nRBC 0.0 0.0 - 0.2 %   Neutrophils Relative % 79 %   Neutro Abs 13.6 (H) 1.7 - 7.7 K/uL   Lymphocytes Relative 9 %   Lymphs Abs 1.6 0.7 - 4.0 K/uL   Monocytes Relative 9 %   Monocytes Absolute 1.5 (H) 0.1 - 1.0 K/uL   Eosinophils Relative 3 %   Eosinophils Absolute 0.6 (H) 0.0 - 0.5 K/uL   Basophils Relative 0 %   Basophils Absolute 0.1 0.0 - 0.1 K/uL   Immature Granulocytes 0 %   Abs Immature  Granulocytes 0.07 0.00 - 0.07 K/uL    Comment: Performed at [facility], [address], [place], [place] [address]  Culture, blood (routine x 2)     Status: None (Preliminary result)   Collection Time: 12/10/20  1:36 PM   Specimen: BLOOD RIGHT HAND  Result Value Ref Range   Specimen Description BLOOD RIGHT HAND    Special Requests      Blood Culture adequate volume BOTTLES DRAWN AEROBIC AND ANAEROBIC   Culture      NO GROWTH < 24 HOURS Performed at [facility], [address], [place], [place] [address]    Report Status PENDING   Culture, blood (routine x 2)     Status: None (Preliminary result)   Collection Time: 12/10/20  1:36 PM   Specimen: BLOOD LEFT HAND  Result Value Ref Range   Specimen Description BLOOD LEFT HAND    Special Requests      Blood Culture results may not be optimal due to an inadequate volume of blood received in culture bottles BOTTLES DRAWN AEROBIC AND ANAEROBIC  Culture      NO GROWTH < 24 HOURS Performed at [facility], [address], [place], [place] [address]    Report Status PENDING   Lactic acid, plasma     Status: None   Collection Time: 12/10/20  4:05 PM  Result Value Ref Range   Lactic Acid, Venous 1.9 0.5 - 1.9 mmol/L    Comment: Performed at [facility], [address] [place], [place] [address]  Protime-INR     Status: None   Collection Time: 12/10/20  4:39 PM  Result Value Ref Range   Prothrombin Time 13.7 11.4 - 15.2 seconds   INR 1.1 0.8 - 1.2    Comment: (NOTE) INR goal varies based on device and disease states. Performed at [facility], [address], [place], [place] [address]   APTT     Status: None   Collection Time: 12/10/20  4:39 PM  Result Value Ref Range   aPTT 30 24 - 36 seconds    Comment: Performed at [facility], [address] [place], [place] [address]  Basic metabolic panel     Status: Abnormal   Collection Time: 12/11/20  5:44 AM  Result Value Ref Range   Sodium 138 135 - 145 mmol/L   Potassium 3.6 3.5 - 5.1  mmol/L    Comment: DELTA CHECK NOTED   Chloride 109 98 - 111 mmol/L   CO2 24 22 - 32 mmol/L   Glucose, Bld 81 70 - 99 mg/dL    Comment: Glucose reference range applies only to samples taken after fasting for at least 8 hours.   BUN 8 8 - 23 mg/dL   Creatinine, Ser 0.43 (L) 0.44 - 1.00 mg/dL   Calcium 7.9 (L) 8.9 - 10.3 mg/dL   GFR, Estimated >60 >60 mL/min    Comment: (NOTE) Calculated using the CKD-EPI Creatinine Equation (2021)    Anion gap 5 5 - 15    Comment: Performed at [facility], [address], [place], [place] [address]  CBC     Status: Abnormal   Collection Time: 12/11/20  5:44 AM  Result Value Ref Range   WBC 9.1 4.0 - 10.5 K/uL   RBC 3.58 (L) 3.87 - 5.11 MIL/uL   Hemoglobin 10.9 (L) 12.0 - 15.0 g/dL   HCT 33.9 (L) 36.0 - 46.0 %   MCV 94.7 80.0 - 100.0 fL   MCH 30.4 26.0 - 34.0 pg   MCHC 32.2 30.0 - 36.0 g/dL   RDW 17.1 (H) 11.5 - 15.5 %   Platelets 262 150 - 400 K/uL   nRBC 0.0 0.0 - 0.2 %    Comment: Performed at [facility], [address] [place], [place] [address]   Personally reviewed- fluid pocket medial glute on right extending from the open skin CT Head Wo Contrast  Result Date: 12/10/2020 CLINICAL DATA:  Transient ischemic attack. Positive loss of consciousness. EXAM: CT HEAD WITHOUT CONTRAST TECHNIQUE: Contiguous axial images were obtained from the base of the skull through the vertex without intravenous contrast. COMPARISON:  January 18, 2019.  January 05, 2009. FINDINGS: Brain: Mild diffuse cortical atrophy is noted. Multiple periventricular white matter low densities are noted consistent with history of multiple sclerosis. No mass effect or midline shift is noted. Ventricular size is within normal limits. There is no evidence of mass lesion, hemorrhage or acute infarction. Vascular: No hyperdense vessel or unexpected calcification. Skull: Normal. Negative for fracture or focal lesion. Sinuses/Orbits: No acute finding. Other: None. IMPRESSION: Findings  consistent with multiple sclerosis. No acute intracranial abnormality seen. Electronically Signed   By: Marijo Conception M.D.   On: 12/10/2020 14:52   MR PELVIS W WO CONTRAST  Result Date: 12/10/2020 CLINICAL DATA:  65 year old female with history of decubitus ulcer of the right buttock region. Evaluate for osteomyelitis. EXAM: MRI PELVIS WITHOUT AND WITH CONTRAST TECHNIQUE: Multiplanar multisequence MR imaging of the pelvis was performed both before and after administration of intravenous contrast. CONTRAST:  4.30mL GADAVIST GADOBUTROL 1 MMOL/ML IV SOLN COMPARISON:  No prior pelvic MRI. CT the abdomen and pelvis 12/10/2020. FINDINGS: Urinary Tract: Visualized portions of distal ureters and urinary bladder unremarkable. Bowel:  Unremarkable visualized pelvic bowel loops. Vascular/Lymphatic: No pathologically enlarged lymph nodes. No significant vascular abnormality seen. Reproductive:  Uterus and ovaries are unremarkable in appearance. Other: No significant volume of ascites noted in the visualized portions of the low anatomic pelvis. Musculoskeletal: Large area of abnormal peripheral enhancement in the low anatomic pelvis predominantly in the medial aspect of the right gluteal region involving the subcutaneous fat overall measuring approximately 7.8 x 3.3 x 6.4 cm (axial image 39 of series 10 and coronal image 8 of series 11). This region demonstrates complex internal signal characteristics, including some signal void which corresponds to gas within the lesion seen on the prior CT examination, compatible with a deep decubitus ulcer and associated gluteal abscess. Some of the abnormal enhancing soft tissue also extends across the midline to involve the medial aspect of the left gluteal region superficially. The most cephalad extent of the enhancing soft tissue comes in close proximity to the inferior aspect of the sacrum and the adjacent coccyx. T2 weighted images demonstrate no definitive increased T2 signal  intensity within the sacrum, although there does appear to be some patchy increased T2 signal intensity within the coccyx where there is also some very mild post gadolinium enhancement, concerning for osteomyelitis. IMPRESSION: 1. 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 Electronically Signed   By: Vinnie Langton M.D.   On: 12/10/2020 19:18   CT ABDOMEN PELVIS W CONTRAST  Result Date: 12/10/2020 CLINICAL DATA:  History of multiple sclerosis with loss of consciousness. EXAM: CT ABDOMEN AND PELVIS WITH CONTRAST TECHNIQUE: Multidetector CT imaging of the abdomen and pelvis was performed using the standard protocol following bolus administration of intravenous contrast. CONTRAST:  69mL OMNIPAQUE IOHEXOL 300 MG/ML  SOLN COMPARISON:  None. FINDINGS: Lower chest: Mild atelectasis is seen within the bilateral lung bases. Elevation of the left hemidiaphragm is noted. Hepatobiliary: No focal liver abnormality is seen. Multiple gallstones are seen within a moderately distended gallbladder. There is no evidence of gallbladder wall thickening or pericholecystic inflammation. Pancreas: Unremarkable. No pancreatic ductal dilatation or surrounding inflammatory changes. Spleen: Normal in size without focal abnormality. Adrenals/Urinary Tract: There is a 2.0 cm x 1.1 cm low-attenuation left adrenal mass (approximately 80.94 Hounsfield units). The right adrenal gland is normal in appearance. Kidneys are normal in size without focal lesions or obstructing renal calculi. Prominent bilateral extrarenal pelvis are noted. The urinary bladder is moderately distended and otherwise normal in appearance. Stomach/Bowel: Mild thickening of the distal esophagus is seen to the level of the gastroesophageal junction. Stomach is within normal limits. Appendix appears normal. No evidence of bowel wall thickening, distention,  or inflammatory changes. Vascular/Lymphatic: There is marked severity calcification and atherosclerosis of the abdominal aorta and bilateral common iliac arteries. Extension to involve the  bilateral internal and bilateral external common iliac arteries is noted. No enlarged abdominal or pelvic lymph nodes. Reproductive: Numerous dilated and tortuous vessels are seen along the periphery of an otherwise normal appearing uterus. The bilateral adnexa are unremarkable. Other: A 6.9 cm x 2.7 cm decubitus ulcer is seen along the posterior aspect of the lower coccyx. This extends inferiorly to the level of the rectum (approximately 4.6 cm in length). Cortical destruction involving lower coccyx cannot be excluded. No abdominopelvic ascites. Musculoskeletal: Degenerative changes are seen throughout the lumbar spine, most prominent at the level of L4-L5. IMPRESSION: 1. Large decubitus ulcer along the posterior aspect of the lower coccyx, as described above. Associated acute osteomyelitis cannot completely be excluded. Correlation with follow-up MRI is recommended. 2. Cholelithiasis without evidence of acute cholecystitis. 3. Left adrenal mass which may represent adrenal adenoma. Further evaluation with adrenal protocol CT is recommended. 4. Findings suggestive of pelvic congestion syndrome. 5. Degenerative changes of the lumbar spine, most prominent at the level of L4-L5. Aortic Atherosclerosis (ICD10-I70.0). Electronically Signed   By: Virgina Norfolk M.D.   On: 12/10/2020 16:12   DG Chest Portable 1 View  Result Date: 12/10/2020 CLINICAL DATA:  Weakness. EXAM: PORTABLE CHEST 1 VIEW COMPARISON:  March 13, 2020. FINDINGS: The heart size and mediastinal contours are within normal limits. Both lungs are clear. The visualized skeletal structures are unremarkable. IMPRESSION: No active disease. Electronically Signed   By: Marijo Conception M.D.   On: 12/10/2020 15:06     Assessment & Plan:  AMNEET CENDEJAS is a 65 y.o.  female with stage IV decubitus ulcer and osteomyelitis which is not unexpected given that bone is exposed. No drainable collection, the muscle is edematous but no fluid pockets noted. Healthy muscle and some granulation with minimal fibrinous tissue over the bone. Overall no need for debridement. Continue packing with saline dampened gauze Would defer treatment of osteo to ID as this is a chronic issues and will persist as long as bone remains exposed Pressure off loading  All questions were answered to the satisfaction of the patient . Discussed with team.    Virl Cagey 12/11/2020, 3:25 PM

## 2020-12-12 DIAGNOSIS — M8668 Other chronic osteomyelitis, other site: Secondary | ICD-10-CM

## 2020-12-12 LAB — CBC WITH DIFFERENTIAL/PLATELET
Abs Immature Granulocytes: 0.03 10*3/uL (ref 0.00–0.07)
Basophils Absolute: 0 10*3/uL (ref 0.0–0.1)
Basophils Relative: 1 %
Eosinophils Absolute: 0 10*3/uL (ref 0.0–0.5)
Eosinophils Relative: 0 %
HCT: 32.6 % — ABNORMAL LOW (ref 36.0–46.0)
Hemoglobin: 10.8 g/dL — ABNORMAL LOW (ref 12.0–15.0)
Immature Granulocytes: 0 %
Lymphocytes Relative: 27 %
Lymphs Abs: 2.2 10*3/uL (ref 0.7–4.0)
MCH: 30.4 pg (ref 26.0–34.0)
MCHC: 33.1 g/dL (ref 30.0–36.0)
MCV: 91.8 fL (ref 80.0–100.0)
Monocytes Absolute: 0.9 10*3/uL (ref 0.1–1.0)
Monocytes Relative: 10 %
Neutro Abs: 5.1 10*3/uL (ref 1.7–7.7)
Neutrophils Relative %: 62 %
Platelets: 251 10*3/uL (ref 150–400)
RBC: 3.55 MIL/uL — ABNORMAL LOW (ref 3.87–5.11)
RDW: 16.7 % — ABNORMAL HIGH (ref 11.5–15.5)
WBC: 8.3 10*3/uL (ref 4.0–10.5)
nRBC: 0 % (ref 0.0–0.2)

## 2020-12-12 LAB — BASIC METABOLIC PANEL
Anion gap: 6 (ref 5–15)
BUN: 14 mg/dL (ref 8–23)
CO2: 26 mmol/L (ref 22–32)
Calcium: 8.3 mg/dL — ABNORMAL LOW (ref 8.9–10.3)
Chloride: 109 mmol/L (ref 98–111)
Creatinine, Ser: 0.49 mg/dL (ref 0.44–1.00)
GFR, Estimated: >60 mL/min (ref >60)
Glucose, Bld: 85 mg/dL (ref 70–99)
Potassium: 3.6 mmol/L (ref 3.5–5.1)
Sodium: 141 mmol/L (ref 135–145)

## 2020-12-12 MED ORDER — PROSOURCE PLUS PO LIQD: 30.0000 mL | Freq: Two times a day (BID) | ORAL | 0 refills | Status: AC

## 2020-12-12 MED ORDER — ENSURE ENLIVE PO LIQD: 237.0000 mL | Freq: Three times a day (TID) | ORAL | 0 refills | Status: AC
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
## 2020-12-12 NOTE — Discharge Summary (Signed)
Discharge Summary  Faith [name] RJJ:[id number] DOB: [birth year]  PCP: Pcp, No  Admit date: 12/10/2020 Discharge date: 12/12/2020  Time spent: 40 mins  Recommendations for Outpatient Follow-up:  Hospice care [facility] RN for extensive wound care  Discharge Diagnoses:  Active Hospital Problems   Diagnosis Date Noted   Acute osteomyelitis ([place]) 12/10/2020   Sacral decubitus ulcer, stage IV ([place])    Decubital ulcer 03/13/2020   COPD (chronic obstructive pulmonary disease) ([place]) 03/01/2020   Hyperlipidemia 11/28/2019   Multiple sclerosis ([place]) 02/24/2016    Resolved Hospital Problems  No resolved problems to display.    Discharge Condition: Stable  Diet recommendation: Regular diet with nutritional supplementation  Vitals:   12/12/20 0427 12/12/20 1431  BP: 139/76 (!) 152/98  Pulse: 75 77  Resp: 19 16  Temp: 98.1 F (36.7 C) 98.1 F (36.7 C)  SpO2: 96% 98%    History of present illness:  Faith [name] is a 65 y.o. female, with past medical history of HTN, multiple sclerosis, wheelchair dependent, pressure ulcer in the right buttock, presents to ED with complaints of syncopal episodes, generalized weakness. At baseline she is able to assist herself from bed to chair. Patient reports that she has a pressure ulcer, where she has a visiting nurse, doing daily dressing. In the ED, noted to be hypothermic 96 Fahrenheit rectal temperature, work-up significant for leukocytosis at 17.3, CT abdomen pelvis significant for acute osteomyelitis at her pressure ulcer area. [facility] hospitalist consulted to admit.   Today, pt denies any new complaints, feels safe going home. Denies any fever/chills, abdominal pain, N/V, chest pain, SOB. Pt is being followed by hospice services at home. Daily wound dressing is done by [facility].    Hospital Course:  Active Problems:   Multiple sclerosis (HCC)   Hyperlipidemia   COPD (chronic obstructive pulmonary disease) (HCC)   Decubital ulcer   Sacral decubitus  ulcer, stage IV (HCC)   Acute osteomyelitis (HCC)    Chronic coccygeal osteomyelitis Unstageable R Gluteal large deep decubitus pressure ulcer, medial right gluteal abscess Currently afebrile, with resolved leukocytosis BC x2 NGTD MRI of the pelvis confirmed coccygeal osteomyelitis as well as right gluteal abscess General surgery consulted- no further management as chronic osteo with no drainable abscess Discussed with ID Dr [place] Bali on 12/12/20, recommended no further antibiotics as osteo is chronic and no drainable abscess noted. Recommended to monitor off antibiotics, maintain strict wound care, offloading and adequate nutritional supplement S/P IV vancomycin and cefepime, no further needed as above Strict wound care   ??Syncope/generalized weakness Resolved Telemetry with no significant events   Multiple sclerosis Wheelchair-bound Continue with home medications including baclofen, aspirin, Celebrex, Valium as needed, gabapentin and as needed Vicodin   Hyperlipidemia Continue with statin   Hypertension Continue home propranolol  Left adrenal mass Incidental findings of 2 cm x 1.1 cm left adrenal mass, work-up can be pursued as an outpatient including an adrenal protocol CT if desired since hospice   Goals of care Followed by home hospice- Full code    Malnutrition Type:  Nutrition Problem: Increased nutrient needs Etiology: wound healing   Malnutrition Characteristics:  Signs/Symptoms: estimated needs   Nutrition Interventions:  Interventions: Ensure Enlive (each supplement provides 350kcal and 20 grams of protein), Prostat, MVI   Estimated body mass index is 17.41 kg/m as calculated from the following:   Height as of this encounter: 5\' 4"  (1.626 m).   Weight as of this encounter: 46 kg.    Procedures: None  Consultations:  Gen surgery  Discharge Exam: BP (!) 152/98 (BP Location: Right Arm)   Pulse 77   Temp 98.1 F (36.7 C)   Resp 16   Ht 5\' 4"   (1.626 m)   Wt 46 kg   SpO2 98%   BMI 17.41 kg/m    General: NAD  Cardiovascular: S1, S2 present Respiratory: CTAB Abdomen: Soft, nontender, nondistended, bowel sounds present Musculoskeletal: No bilateral pedal edema noted Skin: Noted unstageable large R gluteal decubitus ulcer with dressing Psychiatry: Normal mood    Discharge Instructions You were cared for by a hospitalist during your hospital stay. If you have any questions about your discharge medications or the care you received while you were in the hospital after you are discharged, you can call the unit and asked to speak with the hospitalist on call if the hospitalist that took care of you is not available. Once you are discharged, your primary care physician will handle any further medical issues. Please note that NO REFILLS for any discharge medications will be authorized once you are discharged, as it is imperative that you return to your primary care physician (or establish a relationship with a primary care physician if you do not have one) for your aftercare needs so that they can reassess your need for medications and monitor your lab values.   Allergies as of 12/12/2020       Reactions   Darvon [propoxyphene] Other (See Comments)   Hallicuations   Oxycodone-acetaminophen    Does not have any affect on her   Penicillins Rash   Did it involve swelling of the face/tongue/throat, SOB, or low BP? Unknown Did it involve sudden or severe rash/hives, skin peeling, or any reaction on the inside of your mouth or nose? Unknown Did you need to seek medical attention at a hospital or doctor's office? Unknown When did it last happen?      Unknown If all above answers are "NO", may proceed with cephalosporin use.        Medication List     STOP taking these medications    linezolid 600 MG tablet Commonly known as: Zyvox       TAKE these medications    atorvastatin 20 MG tablet Commonly known as: LIPITOR Take 1  tablet (20 mg total) by mouth daily.   baclofen 10 MG tablet Commonly known as: LIORESAL Take 1 tablet (10 mg total) by mouth 3 (three) times daily.   celecoxib 200 MG capsule Commonly known as: CELEBREX Take 1 capsule (200 mg total) by mouth 2 (two) times daily.   Cholecalciferol 50 MCG (2000 UT) Tabs Take 2,000 Int'l Units by mouth daily.   diazepam 5 MG tablet Commonly known as: VALIUM TAKE (1) TABLET BY MOUTH EVERY SIX HOURS AS NEEDED FOR MUSCLE SPASMS.   feeding supplement Liqd Take 237 mLs by mouth 3 (three) times daily between meals.   (feeding supplement) PROSource Plus liquid Take 30 mLs by mouth 2 (two) times daily between meals.   gabapentin 300 MG capsule Commonly known as: Neurontin Take 2 capsules (600 mg total) by mouth 3 (three) times daily.   HYDROcodone-acetaminophen 5-325 MG tablet Commonly known as: NORCO/VICODIN Take 1 tablet by mouth every 6 (six) hours as needed for moderate pain.   ibuprofen 800 MG tablet Commonly known as: ADVIL Take 800 mg by mouth every 8 (eight) hours as needed for fever, headache or mild pain.   metroNIDAZOLE 500 MG tablet Commonly known as: FLAGYL 500 mg daily. Topical for  wound   propranolol 40 MG tablet Commonly known as: INDERAL Take 1 tablet (40 mg total) by mouth 2 (two) times daily.       Allergies  Allergen Reactions   Darvon [Propoxyphene] Other (See Comments)    Hallicuations   Oxycodone-Acetaminophen     Does not have any affect on her   Penicillins Rash    Did it involve swelling of the face/tongue/throat, SOB, or low BP? Unknown Did it involve sudden or severe rash/hives, skin peeling, or any reaction on the inside of your mouth or nose? Unknown Did you need to seek medical attention at a hospital or doctor's office? Unknown When did it last happen?      Unknown If all above answers are "NO", may proceed with cephalosporin use.       The results of significant diagnostics from this  hospitalization (including imaging, microbiology, ancillary and laboratory) are listed below for reference.    Significant Diagnostic Studies: CT Head Wo Contrast  Result Date: 12/10/2020 CLINICAL DATA:  Transient ischemic attack. Positive loss of consciousness. EXAM: CT HEAD WITHOUT CONTRAST TECHNIQUE: Contiguous axial images were obtained from the base of the skull through the vertex without intravenous contrast. COMPARISON:  January 18, 2019.  January 05, 2009. FINDINGS: Brain: Mild diffuse cortical atrophy is noted. Multiple periventricular white matter low densities are noted consistent with history of multiple sclerosis. No mass effect or midline shift is noted. Ventricular size is within normal limits. There is no evidence of mass lesion, hemorrhage or acute infarction. Vascular: No hyperdense vessel or unexpected calcification. Skull: Normal. Negative for fracture or focal lesion. Sinuses/Orbits: No acute finding. Other: None. IMPRESSION: Findings consistent with multiple sclerosis. No acute intracranial abnormality seen. Electronically Signed   By: Marijo Conception M.D.   On: 12/10/2020 14:52   MR PELVIS W WO CONTRAST  Result Date: 12/10/2020 CLINICAL DATA:  65 year old female with history of decubitus ulcer of the right buttock region. Evaluate for osteomyelitis. EXAM: MRI PELVIS WITHOUT AND WITH CONTRAST TECHNIQUE: Multiplanar multisequence MR imaging of the pelvis was performed both before and after administration of intravenous contrast. CONTRAST:  4.42mL GADAVIST GADOBUTROL 1 MMOL/ML IV SOLN COMPARISON:  No prior pelvic MRI. CT the abdomen and pelvis 12/10/2020. FINDINGS: Urinary Tract: Visualized portions of distal ureters and urinary bladder unremarkable. Bowel:  Unremarkable visualized pelvic bowel loops. Vascular/Lymphatic: No pathologically enlarged lymph nodes. No significant vascular abnormality seen. Reproductive:  Uterus and ovaries are unremarkable in appearance. Other: No  significant volume of ascites noted in the visualized portions of the low anatomic pelvis. Musculoskeletal: Large area of abnormal peripheral enhancement in the low anatomic pelvis predominantly in the medial aspect of the right gluteal region involving the subcutaneous fat overall measuring approximately 7.8 x 3.3 x 6.4 cm (axial image 39 of series 10 and coronal image 8 of series 11). This region demonstrates complex internal signal characteristics, including some signal void which corresponds to gas within the lesion seen on the prior CT examination, compatible with a deep decubitus ulcer and associated gluteal abscess. Some of the abnormal enhancing soft tissue also extends across the midline to involve the medial aspect of the left gluteal region superficially. The most cephalad extent of the enhancing soft tissue comes in close proximity to the inferior aspect of the sacrum and the adjacent coccyx. T2 weighted images demonstrate no definitive increased T2 signal intensity within the sacrum, although there does appear to be some patchy increased T2 signal intensity within the coccyx where there  is also some very mild post gadolinium enhancement, concerning for osteomyelitis. IMPRESSION: 1. 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 Electronically Signed   By: Vinnie Langton M.D.   On: 12/10/2020 19:18   CT ABDOMEN PELVIS W CONTRAST  Result Date: 12/10/2020 CLINICAL DATA:  History of multiple sclerosis with loss of consciousness. EXAM: CT ABDOMEN AND PELVIS WITH CONTRAST TECHNIQUE: Multidetector CT imaging of the abdomen and pelvis was performed using the standard protocol following bolus administration of intravenous contrast. CONTRAST:  29mL OMNIPAQUE IOHEXOL 300 MG/ML  SOLN COMPARISON:  None. FINDINGS: Lower chest: Mild atelectasis is seen within the bilateral lung bases. Elevation of  the left hemidiaphragm is noted. Hepatobiliary: No focal liver abnormality is seen. Multiple gallstones are seen within a moderately distended gallbladder. There is no evidence of gallbladder wall thickening or pericholecystic inflammation. Pancreas: Unremarkable. No pancreatic ductal dilatation or surrounding inflammatory changes. Spleen: Normal in size without focal abnormality. Adrenals/Urinary Tract: There is a 2.0 cm x 1.1 cm low-attenuation left adrenal mass (approximately 80.94 Hounsfield units). The right adrenal gland is normal in appearance. Kidneys are normal in size without focal lesions or obstructing renal calculi. Prominent bilateral extrarenal pelvis are noted. The urinary bladder is moderately distended and otherwise normal in appearance. Stomach/Bowel: Mild thickening of the distal esophagus is seen to the level of the gastroesophageal junction. Stomach is within normal limits. Appendix appears normal. No evidence of bowel wall thickening, distention, or inflammatory changes. Vascular/Lymphatic: There is marked severity calcification and atherosclerosis of the abdominal aorta and bilateral common iliac arteries. Extension to involve the bilateral internal and bilateral external common iliac arteries is noted. No enlarged abdominal or pelvic lymph nodes. Reproductive: Numerous dilated and tortuous vessels are seen along the periphery of an otherwise normal appearing uterus. The bilateral adnexa are unremarkable. Other: A 6.9 cm x 2.7 cm decubitus ulcer is seen along the posterior aspect of the lower coccyx. This extends inferiorly to the level of the rectum (approximately 4.6 cm in length). Cortical destruction involving lower coccyx cannot be excluded. No abdominopelvic ascites. Musculoskeletal: Degenerative changes are seen throughout the lumbar spine, most prominent at the level of L4-L5. IMPRESSION: 1. Large decubitus ulcer along the posterior aspect of the lower coccyx, as described above.  Associated acute osteomyelitis cannot completely be excluded. Correlation with follow-up MRI is recommended. 2. Cholelithiasis without evidence of acute cholecystitis. 3. Left adrenal mass which may represent adrenal adenoma. Further evaluation with adrenal protocol CT is recommended. 4. Findings suggestive of pelvic congestion syndrome. 5. Degenerative changes of the lumbar spine, most prominent at the level of L4-L5. Aortic Atherosclerosis (ICD10-I70.0). Electronically Signed   By: Virgina Norfolk M.D.   On: 12/10/2020 16:12   DG Chest Portable 1 View  Result Date: 12/10/2020 CLINICAL DATA:  Weakness. EXAM: PORTABLE CHEST 1 VIEW COMPARISON:  March 13, 2020. FINDINGS: The heart size and mediastinal contours are within normal limits. Both lungs are clear. The visualized skeletal structures are unremarkable. IMPRESSION: No active disease. Electronically Signed   By: Marijo Conception M.D.   On: 12/10/2020 15:06    Microbiology: Recent Results (from the past 240 hour(s))  Resp Panel by RT-PCR (Flu A&B, Covid) Nasopharyngeal Swab     Status: None   Collection Time: 12/10/20  1:03 PM   Specimen: Nasopharyngeal Swab; Nasopharyngeal(NP) swabs in vial transport medium  Result Value Ref Range Status   SARS Coronavirus 2 by  RT PCR NEGATIVE NEGATIVE Final    Comment: (NOTE) SARS-CoV-2 target nucleic acids are NOT DETECTED.  The SARS-CoV-2 RNA is generally detectable in upper respiratory specimens during the acute phase of infection. The lowest concentration of SARS-CoV-2 viral copies this assay can detect is 138 copies/mL. A negative result does not preclude SARS-Cov-2 infection and should not be used as the sole basis for treatment or other patient management decisions. A negative result may occur with  improper specimen collection/handling, submission of specimen other than nasopharyngeal swab, presence of viral mutation(s) within the areas targeted by this assay, and inadequate number of  viral copies(<138 copies/mL). A negative result must be combined with clinical observations, patient history, and epidemiological information. The expected result is Negative.  Fact Sheet for Patients:  EntrepreneurPulse.com.au  Fact Sheet for Healthcare Providers:  IncredibleEmployment.be  This test is no t yet approved or cleared by the [place] FDA and  has been authorized for detection and/or diagnosis of SARS-CoV-2 by FDA under an Emergency Use Authorization (EUA). This EUA will remain  in effect (meaning this test can be used) for the duration of the COVID-19 declaration under Section 564(b)(1) of the Act, 21 U.S.C.section 360bbb-3(b)(1), unless the authorization is terminated  or revoked sooner.       Influenza A by PCR NEGATIVE NEGATIVE Final   Influenza B by PCR NEGATIVE NEGATIVE Final    Comment: (NOTE) The Xpert Xpress SARS-CoV-2/FLU/RSV plus assay is intended as an aid in the diagnosis of influenza from Nasopharyngeal swab specimens and should not be used as a sole basis for treatment. Nasal washings and aspirates are unacceptable for Xpert Xpress SARS-CoV-2/FLU/RSV testing.  Fact Sheet for Patients: EntrepreneurPulse.com.au  Fact Sheet for Healthcare Providers: IncredibleEmployment.be  This test is not yet approved or cleared by the [place] FDA and has been authorized for detection and/or diagnosis of SARS-CoV-2 by FDA under an Emergency Use Authorization (EUA). This EUA will remain in effect (meaning this test can be used) for the duration of the COVID-19 declaration under Section 564(b)(1) of the Act, 21 U.S.C. section 360bbb-3(b)(1), unless the authorization is terminated or revoked.  Performed at [facility], [address] [place], [place] [address]   Culture, blood (routine x 2)     Status: None (Preliminary result)   Collection Time: 12/10/20  1:36 PM   Specimen: BLOOD  RIGHT HAND  Result Value Ref Range Status   Specimen Description BLOOD RIGHT HAND  Final   Special Requests   Final    Blood Culture adequate volume BOTTLES DRAWN AEROBIC AND ANAEROBIC   Culture   Final    NO GROWTH 2 DAYS Performed at [facility], [address], [place], [place] [address]    Report Status PENDING  Incomplete  Culture, blood (routine x 2)     Status: None (Preliminary result)   Collection Time: 12/10/20  1:36 PM   Specimen: BLOOD LEFT HAND  Result Value Ref Range Status   Specimen Description BLOOD LEFT HAND  Final   Special Requests   Final    Blood Culture results may not be optimal due to an inadequate volume of blood received in culture bottles BOTTLES DRAWN AEROBIC AND ANAEROBIC   Culture   Final    NO GROWTH 2 DAYS Performed at [facility], [address], [place], [place] [address]    Report Status PENDING  Incomplete     Labs: Basic Metabolic Panel: Recent Labs  Lab 12/10/20 1336 12/11/20 0544 12/12/20 0528  NA  137 138 141  K 4.5 3.6 3.6  CL 106 109 109  CO2 24 24 26   GLUCOSE 97 81 85  BUN 13 8 14   CREATININE 0.57 0.43* 0.49  CALCIUM 8.7* 7.9* 8.3*   Liver Function Tests: Recent Labs  Lab 12/10/20 1336  AST 17  ALT 12  ALKPHOS 63  BILITOT 0.5  PROT 7.0  ALBUMIN 3.4*   No results for input(s): LIPASE, AMYLASE in the last 168 hours. No results for input(s): AMMONIA in the last 168 hours. CBC: Recent Labs  Lab 12/10/20 1336 12/11/20 0544 12/12/20 0528  WBC 17.3* 9.1 8.3  NEUTROABS 13.6*  --  5.1  HGB 13.2 10.9* 10.8*  HCT 40.9 33.9* 32.6*  MCV 94.5 94.7 91.8  PLT 286 262 251   Cardiac Enzymes: No results for input(s): CKTOTAL, CKMB, CKMBINDEX, TROPONINI in the last 168 hours. BNP: BNP (last 3 results) No results for input(s): BNP in the last 8760 hours.  ProBNP (last 3 results) No results for input(s): PROBNP in the last 8760 hours.  CBG: No results for input(s): GLUCAP in the last 168  hours.     Signed:  Alma Friendly, MD [facility] 12/12/2020, 3:15 PM

## 2020-12-12 NOTE — Progress Notes (Signed)
Faith [name] Room [address] [facility]) Hospitalized Hospice Patient  Faith [name] is a current hospice patient with a terminal diagnosis of multiple sclerosis per Dr. Karie Georges, [facility] MD. On 12/10/20, Faith [name] began to feel bad, called for her son in the other room, he reported seizure type activity and unresponsiveness. EMS was activated and the decision was made to send her to the ED for further evaluation. She is admitted for work up of suspected osteomyelitis and syncopal episode, this is a related hospital admission per Dr. Karie Georges, [facility] MD.  Faith [name] report with nursing staff. Visited patient at the bedside who endorses pain after just being cleaned and repositioned.  RN aware.  Pt understands plan to hear from ID about possible treatment for osteomyelitis.  Discussed briefly what pt would want if IV abx were offered; pt reports preference to attempt to treat with oral abx. This RN encouraged pt to discuss this with ID as appropriate.  Pt declined for this RN to reach out to her son, reports just having spoken with him by phone.  Faith [name] remains inpatient appropriate for treatment of suspected osteomyelitis, requiring IV antibiotics and fluids.   V/S: 98.1 oral, 139/76, HR 75, RR 19, SPO2 96% on RA I&O: 3643/3650 Labs: WBC 8.3, Hgb 10.8, Ca 8.3 Imaging: no new scans IV/PRN: NS continuous @ 75 ml/hr, maxipeme 2 g IV BID, vancomycin 750 mg daily, valium 2 mg IV x 0, hydrocodone 5-325 mg PO x 2   Problem List: - Suspected osteomyelitis of coccyx - IV antibiotics, surgery consulted and per family will not be requiring surgery. ID consult pending  - Seizure activity - no hx of seizure disorder, suspected syncopal episode, alert and oriented to her baseline today  - Multiple sclerosis - under hospice services for this. Valium as needed for muscle spasms.  - Left adrenal mass - incidental finding, further work up will be dependent upon what Faith [name] chooses to do once she is discharged  from the hospital   [place]: Ongoing. She is under hospice services, remains a full code. D/C planning: Return back home with her son and hospice support. She will need ambulance transportation back home. IDT: Hospice team updated Family: Pt Aox4, declines for this RN to reach out to son, prefers to communicate for herself.   Thank you for the opportunity to participate in this pt's care.  Domenic Moras, BSN, RN [facility] Liaison

## 2020-12-13 DIAGNOSIS — Z8614 Personal history of Methicillin resistant Staphylococcus aureus infection: Secondary | ICD-10-CM | POA: Diagnosis not present

## 2020-12-13 DIAGNOSIS — E46 Unspecified protein-calorie malnutrition: Secondary | ICD-10-CM | POA: Diagnosis not present

## 2020-12-13 DIAGNOSIS — I10 Essential (primary) hypertension: Secondary | ICD-10-CM | POA: Diagnosis not present

## 2020-12-13 DIAGNOSIS — R296 Repeated falls: Secondary | ICD-10-CM | POA: Diagnosis not present

## 2020-12-13 DIAGNOSIS — G47419 Narcolepsy without cataplexy: Secondary | ICD-10-CM | POA: Diagnosis not present

## 2020-12-13 DIAGNOSIS — L89153 Pressure ulcer of sacral region, stage 3: Secondary | ICD-10-CM | POA: Diagnosis not present

## 2020-12-13 DIAGNOSIS — E785 Hyperlipidemia, unspecified: Secondary | ICD-10-CM | POA: Diagnosis not present

## 2020-12-13 DIAGNOSIS — G35 Multiple sclerosis: Secondary | ICD-10-CM | POA: Diagnosis not present

## 2020-12-13 DIAGNOSIS — J449 Chronic obstructive pulmonary disease, unspecified: Secondary | ICD-10-CM | POA: Diagnosis not present

## 2020-12-13 DIAGNOSIS — Z681 Body mass index (BMI) 19 or less, adult: Secondary | ICD-10-CM | POA: Diagnosis not present

## 2020-12-13 DIAGNOSIS — D649 Anemia, unspecified: Secondary | ICD-10-CM | POA: Diagnosis not present

## 2020-12-14 DIAGNOSIS — L89153 Pressure ulcer of sacral region, stage 3: Secondary | ICD-10-CM | POA: Diagnosis not present

## 2020-12-14 DIAGNOSIS — D649 Anemia, unspecified: Secondary | ICD-10-CM | POA: Diagnosis not present

## 2020-12-14 DIAGNOSIS — G47419 Narcolepsy without cataplexy: Secondary | ICD-10-CM | POA: Diagnosis not present

## 2020-12-14 DIAGNOSIS — I10 Essential (primary) hypertension: Secondary | ICD-10-CM | POA: Diagnosis not present

## 2020-12-14 DIAGNOSIS — Z8614 Personal history of Methicillin resistant Staphylococcus aureus infection: Secondary | ICD-10-CM | POA: Diagnosis not present

## 2020-12-14 DIAGNOSIS — E46 Unspecified protein-calorie malnutrition: Secondary | ICD-10-CM | POA: Diagnosis not present

## 2020-12-14 DIAGNOSIS — E785 Hyperlipidemia, unspecified: Secondary | ICD-10-CM | POA: Diagnosis not present

## 2020-12-14 DIAGNOSIS — R296 Repeated falls: Secondary | ICD-10-CM | POA: Diagnosis not present

## 2020-12-14 DIAGNOSIS — J449 Chronic obstructive pulmonary disease, unspecified: Secondary | ICD-10-CM | POA: Diagnosis not present

## 2020-12-14 DIAGNOSIS — G35 Multiple sclerosis: Secondary | ICD-10-CM | POA: Diagnosis not present

## 2020-12-14 DIAGNOSIS — Z681 Body mass index (BMI) 19 or less, adult: Secondary | ICD-10-CM | POA: Diagnosis not present

## 2020-12-15 ENCOUNTER — Telehealth: Payer: Self-pay

## 2020-12-15 DIAGNOSIS — R296 Repeated falls: Secondary | ICD-10-CM | POA: Diagnosis not present

## 2020-12-15 DIAGNOSIS — L89153 Pressure ulcer of sacral region, stage 3: Secondary | ICD-10-CM | POA: Diagnosis not present

## 2020-12-15 DIAGNOSIS — J449 Chronic obstructive pulmonary disease, unspecified: Secondary | ICD-10-CM | POA: Diagnosis not present

## 2020-12-15 DIAGNOSIS — D649 Anemia, unspecified: Secondary | ICD-10-CM | POA: Diagnosis not present

## 2020-12-15 DIAGNOSIS — E785 Hyperlipidemia, unspecified: Secondary | ICD-10-CM | POA: Diagnosis not present

## 2020-12-15 DIAGNOSIS — G35 Multiple sclerosis: Secondary | ICD-10-CM | POA: Diagnosis not present

## 2020-12-15 DIAGNOSIS — Z681 Body mass index (BMI) 19 or less, adult: Secondary | ICD-10-CM | POA: Diagnosis not present

## 2020-12-15 DIAGNOSIS — I10 Essential (primary) hypertension: Secondary | ICD-10-CM | POA: Diagnosis not present

## 2020-12-15 DIAGNOSIS — G47419 Narcolepsy without cataplexy: Secondary | ICD-10-CM | POA: Diagnosis not present

## 2020-12-15 DIAGNOSIS — E46 Unspecified protein-calorie malnutrition: Secondary | ICD-10-CM | POA: Diagnosis not present

## 2020-12-15 DIAGNOSIS — Z8614 Personal history of Methicillin resistant Staphylococcus aureus infection: Secondary | ICD-10-CM | POA: Diagnosis not present

## 2020-12-15 LAB — CULTURE, BLOOD (ROUTINE X 2)
Culture: NO GROWTH
Culture: NO GROWTH
Special Requests: ADEQUATE

## 2020-12-15 NOTE — Telephone Encounter (Signed)
Transition Care Management Follow-up Telephone Call Date of discharge and from where: 12/12/2020-[place]  How have you been since you were released from the hospital? Patient stated she is doing ok and was currently with the Hospice Nurse  Any questions or concerns? No  Items Reviewed: Did the pt receive and understand the discharge instructions provided? Yes  Medications obtained and verified? Yes  Other? No  Any new allergies since your discharge? No  Dietary orders reviewed? No Do you have support at home? Yes   Home Care and Equipment/Supplies: Were home health services ordered? not applicable If so, what is the name of the agency? N/A  Has the agency set up a time to come to the patient's home? not applicable Were any new equipment or medical supplies ordered?  No What is the name of the medical supply agency? N/A Were you able to get the supplies/equipment? not applicable Do you have any questions related to the use of the equipment or supplies? No  Functional Questionnaire: (I = Independent and D = Dependent) ADLs: I  Bathing/Dressing- I  Meal Prep- I  Eating- I  Maintaining continence- I  Transferring/Ambulation- I  Managing Meds- I  Follow up appointments reviewed:  PCP Hospital f/u appt confirmed? Yes  Scheduled to see Dr. Posey Pronto on 12/22/2020 @ 3pm. [facility] f/u appt confirmed? No   Are transportation arrangements needed? No  If their condition worsens, is the pt aware to call PCP or go to the Emergency Dept.? Yes Was the patient provided with contact information for the PCP's office or ED? Yes Was to pt encouraged to call back with questions or concerns? Yes

## 2020-12-16 DIAGNOSIS — R296 Repeated falls: Secondary | ICD-10-CM | POA: Diagnosis not present

## 2020-12-16 DIAGNOSIS — J449 Chronic obstructive pulmonary disease, unspecified: Secondary | ICD-10-CM | POA: Diagnosis not present

## 2020-12-16 DIAGNOSIS — E785 Hyperlipidemia, unspecified: Secondary | ICD-10-CM | POA: Diagnosis not present

## 2020-12-16 DIAGNOSIS — G47419 Narcolepsy without cataplexy: Secondary | ICD-10-CM | POA: Diagnosis not present

## 2020-12-16 DIAGNOSIS — I10 Essential (primary) hypertension: Secondary | ICD-10-CM | POA: Diagnosis not present

## 2020-12-16 DIAGNOSIS — L89153 Pressure ulcer of sacral region, stage 3: Secondary | ICD-10-CM | POA: Diagnosis not present

## 2020-12-16 DIAGNOSIS — Z8614 Personal history of Methicillin resistant Staphylococcus aureus infection: Secondary | ICD-10-CM | POA: Diagnosis not present

## 2020-12-16 DIAGNOSIS — Z681 Body mass index (BMI) 19 or less, adult: Secondary | ICD-10-CM | POA: Diagnosis not present

## 2020-12-16 DIAGNOSIS — E46 Unspecified protein-calorie malnutrition: Secondary | ICD-10-CM | POA: Diagnosis not present

## 2020-12-16 DIAGNOSIS — G35 Multiple sclerosis: Secondary | ICD-10-CM | POA: Diagnosis not present

## 2020-12-16 DIAGNOSIS — D649 Anemia, unspecified: Secondary | ICD-10-CM | POA: Diagnosis not present

## 2020-12-17 DIAGNOSIS — D649 Anemia, unspecified: Secondary | ICD-10-CM | POA: Diagnosis not present

## 2020-12-17 DIAGNOSIS — L89153 Pressure ulcer of sacral region, stage 3: Secondary | ICD-10-CM | POA: Diagnosis not present

## 2020-12-17 DIAGNOSIS — R296 Repeated falls: Secondary | ICD-10-CM | POA: Diagnosis not present

## 2020-12-17 DIAGNOSIS — G35 Multiple sclerosis: Secondary | ICD-10-CM | POA: Diagnosis not present

## 2020-12-17 DIAGNOSIS — E785 Hyperlipidemia, unspecified: Secondary | ICD-10-CM | POA: Diagnosis not present

## 2020-12-17 DIAGNOSIS — I10 Essential (primary) hypertension: Secondary | ICD-10-CM | POA: Diagnosis not present

## 2020-12-17 DIAGNOSIS — Z681 Body mass index (BMI) 19 or less, adult: Secondary | ICD-10-CM | POA: Diagnosis not present

## 2020-12-17 DIAGNOSIS — Z8614 Personal history of Methicillin resistant Staphylococcus aureus infection: Secondary | ICD-10-CM | POA: Diagnosis not present

## 2020-12-17 DIAGNOSIS — G47419 Narcolepsy without cataplexy: Secondary | ICD-10-CM | POA: Diagnosis not present

## 2020-12-17 DIAGNOSIS — J449 Chronic obstructive pulmonary disease, unspecified: Secondary | ICD-10-CM | POA: Diagnosis not present

## 2020-12-17 DIAGNOSIS — E46 Unspecified protein-calorie malnutrition: Secondary | ICD-10-CM | POA: Diagnosis not present

## 2020-12-18 DIAGNOSIS — L89153 Pressure ulcer of sacral region, stage 3: Secondary | ICD-10-CM | POA: Diagnosis not present

## 2020-12-18 DIAGNOSIS — Z681 Body mass index (BMI) 19 or less, adult: Secondary | ICD-10-CM | POA: Diagnosis not present

## 2020-12-18 DIAGNOSIS — I10 Essential (primary) hypertension: Secondary | ICD-10-CM | POA: Diagnosis not present

## 2020-12-18 DIAGNOSIS — G35 Multiple sclerosis: Secondary | ICD-10-CM | POA: Diagnosis not present

## 2020-12-18 DIAGNOSIS — J449 Chronic obstructive pulmonary disease, unspecified: Secondary | ICD-10-CM | POA: Diagnosis not present

## 2020-12-18 DIAGNOSIS — D649 Anemia, unspecified: Secondary | ICD-10-CM | POA: Diagnosis not present

## 2020-12-18 DIAGNOSIS — Z8614 Personal history of Methicillin resistant Staphylococcus aureus infection: Secondary | ICD-10-CM | POA: Diagnosis not present

## 2020-12-18 DIAGNOSIS — E785 Hyperlipidemia, unspecified: Secondary | ICD-10-CM | POA: Diagnosis not present

## 2020-12-18 DIAGNOSIS — R296 Repeated falls: Secondary | ICD-10-CM | POA: Diagnosis not present

## 2020-12-18 DIAGNOSIS — G47419 Narcolepsy without cataplexy: Secondary | ICD-10-CM | POA: Diagnosis not present

## 2020-12-18 DIAGNOSIS — E46 Unspecified protein-calorie malnutrition: Secondary | ICD-10-CM | POA: Diagnosis not present

## 2020-12-19 DIAGNOSIS — L89153 Pressure ulcer of sacral region, stage 3: Secondary | ICD-10-CM | POA: Diagnosis not present

## 2020-12-19 DIAGNOSIS — G47419 Narcolepsy without cataplexy: Secondary | ICD-10-CM | POA: Diagnosis not present

## 2020-12-19 DIAGNOSIS — Z681 Body mass index (BMI) 19 or less, adult: Secondary | ICD-10-CM | POA: Diagnosis not present

## 2020-12-19 DIAGNOSIS — I10 Essential (primary) hypertension: Secondary | ICD-10-CM | POA: Diagnosis not present

## 2020-12-19 DIAGNOSIS — Z8614 Personal history of Methicillin resistant Staphylococcus aureus infection: Secondary | ICD-10-CM | POA: Diagnosis not present

## 2020-12-19 DIAGNOSIS — E785 Hyperlipidemia, unspecified: Secondary | ICD-10-CM | POA: Diagnosis not present

## 2020-12-19 DIAGNOSIS — D649 Anemia, unspecified: Secondary | ICD-10-CM | POA: Diagnosis not present

## 2020-12-19 DIAGNOSIS — E46 Unspecified protein-calorie malnutrition: Secondary | ICD-10-CM | POA: Diagnosis not present

## 2020-12-19 DIAGNOSIS — G35 Multiple sclerosis: Secondary | ICD-10-CM | POA: Diagnosis not present

## 2020-12-19 DIAGNOSIS — J449 Chronic obstructive pulmonary disease, unspecified: Secondary | ICD-10-CM | POA: Diagnosis not present

## 2020-12-19 DIAGNOSIS — R296 Repeated falls: Secondary | ICD-10-CM | POA: Diagnosis not present

## 2020-12-20 DIAGNOSIS — G35 Multiple sclerosis: Secondary | ICD-10-CM | POA: Diagnosis not present

## 2020-12-20 DIAGNOSIS — J449 Chronic obstructive pulmonary disease, unspecified: Secondary | ICD-10-CM | POA: Diagnosis not present

## 2020-12-20 DIAGNOSIS — Z8614 Personal history of Methicillin resistant Staphylococcus aureus infection: Secondary | ICD-10-CM | POA: Diagnosis not present

## 2020-12-20 DIAGNOSIS — D649 Anemia, unspecified: Secondary | ICD-10-CM | POA: Diagnosis not present

## 2020-12-20 DIAGNOSIS — E46 Unspecified protein-calorie malnutrition: Secondary | ICD-10-CM | POA: Diagnosis not present

## 2020-12-20 DIAGNOSIS — L89153 Pressure ulcer of sacral region, stage 3: Secondary | ICD-10-CM | POA: Diagnosis not present

## 2020-12-20 DIAGNOSIS — I10 Essential (primary) hypertension: Secondary | ICD-10-CM | POA: Diagnosis not present

## 2020-12-20 DIAGNOSIS — Z681 Body mass index (BMI) 19 or less, adult: Secondary | ICD-10-CM | POA: Diagnosis not present

## 2020-12-20 DIAGNOSIS — R296 Repeated falls: Secondary | ICD-10-CM | POA: Diagnosis not present

## 2020-12-20 DIAGNOSIS — E785 Hyperlipidemia, unspecified: Secondary | ICD-10-CM | POA: Diagnosis not present

## 2020-12-20 DIAGNOSIS — G47419 Narcolepsy without cataplexy: Secondary | ICD-10-CM | POA: Diagnosis not present

## 2020-12-21 DIAGNOSIS — L89153 Pressure ulcer of sacral region, stage 3: Secondary | ICD-10-CM | POA: Diagnosis not present

## 2020-12-21 DIAGNOSIS — E46 Unspecified protein-calorie malnutrition: Secondary | ICD-10-CM | POA: Diagnosis not present

## 2020-12-21 DIAGNOSIS — J449 Chronic obstructive pulmonary disease, unspecified: Secondary | ICD-10-CM | POA: Diagnosis not present

## 2020-12-21 DIAGNOSIS — R296 Repeated falls: Secondary | ICD-10-CM | POA: Diagnosis not present

## 2020-12-21 DIAGNOSIS — Z681 Body mass index (BMI) 19 or less, adult: Secondary | ICD-10-CM | POA: Diagnosis not present

## 2020-12-21 DIAGNOSIS — E785 Hyperlipidemia, unspecified: Secondary | ICD-10-CM | POA: Diagnosis not present

## 2020-12-21 DIAGNOSIS — I10 Essential (primary) hypertension: Secondary | ICD-10-CM | POA: Diagnosis not present

## 2020-12-21 DIAGNOSIS — D649 Anemia, unspecified: Secondary | ICD-10-CM | POA: Diagnosis not present

## 2020-12-21 DIAGNOSIS — G35 Multiple sclerosis: Secondary | ICD-10-CM | POA: Diagnosis not present

## 2020-12-21 DIAGNOSIS — Z8614 Personal history of Methicillin resistant Staphylococcus aureus infection: Secondary | ICD-10-CM | POA: Diagnosis not present

## 2020-12-21 DIAGNOSIS — G47419 Narcolepsy without cataplexy: Secondary | ICD-10-CM | POA: Diagnosis not present

## 2020-12-22 ENCOUNTER — Ambulatory Visit: Payer: Medicaid Other | Admitting: Internal Medicine

## 2020-12-22 DIAGNOSIS — L89153 Pressure ulcer of sacral region, stage 3: Secondary | ICD-10-CM | POA: Diagnosis not present

## 2020-12-22 DIAGNOSIS — E785 Hyperlipidemia, unspecified: Secondary | ICD-10-CM | POA: Diagnosis not present

## 2020-12-22 DIAGNOSIS — I10 Essential (primary) hypertension: Secondary | ICD-10-CM | POA: Diagnosis not present

## 2020-12-22 DIAGNOSIS — R296 Repeated falls: Secondary | ICD-10-CM | POA: Diagnosis not present

## 2020-12-22 DIAGNOSIS — Z681 Body mass index (BMI) 19 or less, adult: Secondary | ICD-10-CM | POA: Diagnosis not present

## 2020-12-22 DIAGNOSIS — G47419 Narcolepsy without cataplexy: Secondary | ICD-10-CM | POA: Diagnosis not present

## 2020-12-22 DIAGNOSIS — D649 Anemia, unspecified: Secondary | ICD-10-CM | POA: Diagnosis not present

## 2020-12-22 DIAGNOSIS — E46 Unspecified protein-calorie malnutrition: Secondary | ICD-10-CM | POA: Diagnosis not present

## 2020-12-22 DIAGNOSIS — G35 Multiple sclerosis: Secondary | ICD-10-CM | POA: Diagnosis not present

## 2020-12-22 DIAGNOSIS — J449 Chronic obstructive pulmonary disease, unspecified: Secondary | ICD-10-CM | POA: Diagnosis not present

## 2020-12-22 DIAGNOSIS — Z8614 Personal history of Methicillin resistant Staphylococcus aureus infection: Secondary | ICD-10-CM | POA: Diagnosis not present

## 2020-12-23 DIAGNOSIS — J449 Chronic obstructive pulmonary disease, unspecified: Secondary | ICD-10-CM | POA: Diagnosis not present

## 2020-12-23 DIAGNOSIS — G35 Multiple sclerosis: Secondary | ICD-10-CM | POA: Diagnosis not present

## 2020-12-23 DIAGNOSIS — G47419 Narcolepsy without cataplexy: Secondary | ICD-10-CM | POA: Diagnosis not present

## 2020-12-23 DIAGNOSIS — Z681 Body mass index (BMI) 19 or less, adult: Secondary | ICD-10-CM | POA: Diagnosis not present

## 2020-12-23 DIAGNOSIS — Z8614 Personal history of Methicillin resistant Staphylococcus aureus infection: Secondary | ICD-10-CM | POA: Diagnosis not present

## 2020-12-23 DIAGNOSIS — L89153 Pressure ulcer of sacral region, stage 3: Secondary | ICD-10-CM | POA: Diagnosis not present

## 2020-12-23 DIAGNOSIS — E46 Unspecified protein-calorie malnutrition: Secondary | ICD-10-CM | POA: Diagnosis not present

## 2020-12-23 DIAGNOSIS — R296 Repeated falls: Secondary | ICD-10-CM | POA: Diagnosis not present

## 2020-12-23 DIAGNOSIS — D649 Anemia, unspecified: Secondary | ICD-10-CM | POA: Diagnosis not present

## 2020-12-23 DIAGNOSIS — E785 Hyperlipidemia, unspecified: Secondary | ICD-10-CM | POA: Diagnosis not present

## 2020-12-23 DIAGNOSIS — I10 Essential (primary) hypertension: Secondary | ICD-10-CM | POA: Diagnosis not present

## 2020-12-24 DIAGNOSIS — Z681 Body mass index (BMI) 19 or less, adult: Secondary | ICD-10-CM | POA: Diagnosis not present

## 2020-12-24 DIAGNOSIS — I10 Essential (primary) hypertension: Secondary | ICD-10-CM | POA: Diagnosis not present

## 2020-12-24 DIAGNOSIS — L89153 Pressure ulcer of sacral region, stage 3: Secondary | ICD-10-CM | POA: Diagnosis not present

## 2020-12-24 DIAGNOSIS — Z8614 Personal history of Methicillin resistant Staphylococcus aureus infection: Secondary | ICD-10-CM | POA: Diagnosis not present

## 2020-12-24 DIAGNOSIS — E46 Unspecified protein-calorie malnutrition: Secondary | ICD-10-CM | POA: Diagnosis not present

## 2020-12-24 DIAGNOSIS — R296 Repeated falls: Secondary | ICD-10-CM | POA: Diagnosis not present

## 2020-12-24 DIAGNOSIS — J449 Chronic obstructive pulmonary disease, unspecified: Secondary | ICD-10-CM | POA: Diagnosis not present

## 2020-12-24 DIAGNOSIS — E785 Hyperlipidemia, unspecified: Secondary | ICD-10-CM | POA: Diagnosis not present

## 2020-12-24 DIAGNOSIS — D649 Anemia, unspecified: Secondary | ICD-10-CM | POA: Diagnosis not present

## 2020-12-24 DIAGNOSIS — G47419 Narcolepsy without cataplexy: Secondary | ICD-10-CM | POA: Diagnosis not present

## 2020-12-24 DIAGNOSIS — G35 Multiple sclerosis: Secondary | ICD-10-CM | POA: Diagnosis not present

## 2020-12-25 ENCOUNTER — Encounter (HOSPITAL_COMMUNITY): Payer: Self-pay | Admitting: *Deleted

## 2020-12-25 ENCOUNTER — Emergency Department (HOSPITAL_COMMUNITY): Payer: Medicaid Other

## 2020-12-25 ENCOUNTER — Emergency Department (HOSPITAL_COMMUNITY)
Admission: EM | Admit: 2020-12-25 | Discharge: 2020-12-25 | Disposition: A | Discharge status: Home / Self Care | Payer: Medicaid Other | Attending: Emergency Medicine | Admitting: Emergency Medicine

## 2020-12-25 ENCOUNTER — Other Ambulatory Visit: Payer: Self-pay

## 2020-12-25 DIAGNOSIS — D72829 Elevated white blood cell count, unspecified: Secondary | ICD-10-CM | POA: Diagnosis not present

## 2020-12-25 DIAGNOSIS — L89159 Pressure ulcer of sacral region, unspecified stage: Secondary | ICD-10-CM | POA: Diagnosis not present

## 2020-12-25 DIAGNOSIS — R55 Syncope and collapse: Secondary | ICD-10-CM | POA: Diagnosis not present

## 2020-12-25 DIAGNOSIS — R197 Diarrhea, unspecified: Secondary | ICD-10-CM | POA: Insufficient documentation

## 2020-12-25 DIAGNOSIS — K5900 Constipation, unspecified: Secondary | ICD-10-CM | POA: Insufficient documentation

## 2020-12-25 DIAGNOSIS — M545 Low back pain, unspecified: Secondary | ICD-10-CM | POA: Diagnosis not present

## 2020-12-25 DIAGNOSIS — R109 Unspecified abdominal pain: Secondary | ICD-10-CM | POA: Diagnosis not present

## 2020-12-25 DIAGNOSIS — F17210 Nicotine dependence, cigarettes, uncomplicated: Secondary | ICD-10-CM | POA: Diagnosis not present

## 2020-12-25 DIAGNOSIS — I10 Essential (primary) hypertension: Secondary | ICD-10-CM | POA: Diagnosis not present

## 2020-12-25 DIAGNOSIS — R0902 Hypoxemia: Secondary | ICD-10-CM | POA: Diagnosis not present

## 2020-12-25 DIAGNOSIS — E785 Hyperlipidemia, unspecified: Secondary | ICD-10-CM | POA: Diagnosis not present

## 2020-12-25 DIAGNOSIS — N858 Other specified noninflammatory disorders of uterus: Secondary | ICD-10-CM | POA: Diagnosis not present

## 2020-12-25 DIAGNOSIS — I959 Hypotension, unspecified: Secondary | ICD-10-CM | POA: Diagnosis not present

## 2020-12-25 DIAGNOSIS — R296 Repeated falls: Secondary | ICD-10-CM | POA: Diagnosis not present

## 2020-12-25 DIAGNOSIS — L89153 Pressure ulcer of sacral region, stage 3: Secondary | ICD-10-CM | POA: Diagnosis not present

## 2020-12-25 DIAGNOSIS — E860 Dehydration: Secondary | ICD-10-CM | POA: Diagnosis not present

## 2020-12-25 DIAGNOSIS — Z79899 Other long term (current) drug therapy: Secondary | ICD-10-CM | POA: Diagnosis not present

## 2020-12-25 DIAGNOSIS — Z8614 Personal history of Methicillin resistant Staphylococcus aureus infection: Secondary | ICD-10-CM | POA: Diagnosis not present

## 2020-12-25 DIAGNOSIS — J449 Chronic obstructive pulmonary disease, unspecified: Secondary | ICD-10-CM | POA: Insufficient documentation

## 2020-12-25 DIAGNOSIS — D649 Anemia, unspecified: Secondary | ICD-10-CM | POA: Diagnosis not present

## 2020-12-25 DIAGNOSIS — G47419 Narcolepsy without cataplexy: Secondary | ICD-10-CM | POA: Diagnosis not present

## 2020-12-25 DIAGNOSIS — E46 Unspecified protein-calorie malnutrition: Secondary | ICD-10-CM | POA: Diagnosis not present

## 2020-12-25 DIAGNOSIS — R531 Weakness: Secondary | ICD-10-CM | POA: Diagnosis present

## 2020-12-25 DIAGNOSIS — Z681 Body mass index (BMI) 19 or less, adult: Secondary | ICD-10-CM | POA: Diagnosis not present

## 2020-12-25 DIAGNOSIS — K8020 Calculus of gallbladder without cholecystitis without obstruction: Secondary | ICD-10-CM | POA: Diagnosis not present

## 2020-12-25 DIAGNOSIS — Z7401 Bed confinement status: Secondary | ICD-10-CM | POA: Diagnosis not present

## 2020-12-25 DIAGNOSIS — G35 Multiple sclerosis: Secondary | ICD-10-CM | POA: Diagnosis not present

## 2020-12-25 LAB — CBC WITH DIFFERENTIAL/PLATELET
Abs Immature Granulocytes: 0.07 10*3/uL (ref 0.00–0.07)
Basophils Absolute: 0 10*3/uL (ref 0.0–0.1)
Basophils Relative: 0 %
Eosinophils Absolute: 0.4 10*3/uL (ref 0.0–0.5)
Eosinophils Relative: 2 %
HCT: 41.8 % (ref 36.0–46.0)
Hemoglobin: 13.5 g/dL (ref 12.0–15.0)
Immature Granulocytes: 0 %
Lymphocytes Relative: 10 %
Lymphs Abs: 1.7 10*3/uL (ref 0.7–4.0)
MCH: 31 pg (ref 26.0–34.0)
MCHC: 32.3 g/dL (ref 30.0–36.0)
MCV: 96.1 fL (ref 80.0–100.0)
Monocytes Absolute: 1.3 10*3/uL — ABNORMAL HIGH (ref 0.1–1.0)
Monocytes Relative: 8 %
Neutro Abs: 12.8 10*3/uL — ABNORMAL HIGH (ref 1.7–7.7)
Neutrophils Relative %: 80 %
Platelets: 281 10*3/uL (ref 150–400)
RBC: 4.35 MIL/uL (ref 3.87–5.11)
RDW: 17 % — ABNORMAL HIGH (ref 11.5–15.5)
WBC: 16.2 10*3/uL — ABNORMAL HIGH (ref 4.0–10.5)
nRBC: 0 % (ref 0.0–0.2)

## 2020-12-25 LAB — LACTIC ACID, PLASMA: Lactic Acid, Venous: 0.7 mmol/L (ref 0.5–1.9)

## 2020-12-25 LAB — COMPREHENSIVE METABOLIC PANEL
ALT: 14 U/L (ref 0–44)
AST: 18 U/L (ref 15–41)
Albumin: 3.5 g/dL (ref 3.5–5.0)
Alkaline Phosphatase: 59 U/L (ref 38–126)
Anion gap: 6 (ref 5–15)
BUN: 27 mg/dL — ABNORMAL HIGH (ref 8–23)
CO2: 25 mmol/L (ref 22–32)
Calcium: 9 mg/dL (ref 8.9–10.3)
Chloride: 104 mmol/L (ref 98–111)
Creatinine, Ser: 0.6 mg/dL (ref 0.44–1.00)
GFR, Estimated: >60 mL/min (ref >60)
Glucose, Bld: 96 mg/dL (ref 70–99)
Potassium: 5.2 mmol/L — ABNORMAL HIGH (ref 3.5–5.1)
Sodium: 135 mmol/L (ref 135–145)
Total Bilirubin: 0.4 mg/dL (ref 0.3–1.2)
Total Protein: 7 g/dL (ref 6.5–8.1)

## 2020-12-25 LAB — LIPASE, BLOOD: Lipase: 31 U/L (ref 11–51)

## 2020-12-25 MED ORDER — IOHEXOL 300 MG/ML  SOLN
100.0000 mL | Freq: Once | INTRAMUSCULAR | Status: AC | PRN
  Administered 2020-12-25: 100 mL via INTRAVENOUS

## 2020-12-25 MED ORDER — SODIUM CHLORIDE 0.9 % IV BOLUS
1000.0000 mL | Freq: Once | INTRAVENOUS | Status: AC
  Administered 2020-12-25: 1000 mL via INTRAVENOUS

## 2020-12-25 MED ORDER — POLYETHYLENE GLYCOL 3350 17 G PO PACK: 17.0000 g | PACK | Freq: Every day | ORAL | 0 refills | Status: DC

## 2020-12-25 MED ORDER — SODIUM CHLORIDE 0.9 % IV SOLN: INTRAVENOUS | Status: DC

## 2020-12-25 NOTE — ED Notes (Signed)
Patient transported to CT 

## 2020-12-25 NOTE — ED Triage Notes (Signed)
Pt brought in by [facility] from home with c/o diarrhea and weakness. Pt was seen here last week for dehydration and placed on antibiotics. Pt has been having diarrhea since leaving the hospital. Today she felt like she was going to pass out. EMS reports initial SBP 77. After 246ml NS BP 101/70.

## 2020-12-25 NOTE — Discharge Instructions (Signed)
Return for any new or worse symptoms.  No evidence of any significant infection.  Small intestines were normal.  Actually large intestine showed actually a stool burden consistent with constipation.  So some of the diarrhea may be some loose stool moving around the area of constipation.  But no signs of any bowel obstruction.  Decubitus wounds based on CT seem to be stable.  Blood pressures seem to improved with fluids.  Would recommend some MiraLAX daily to help with the constipation.  Push fluids.  Return for any new or worse symptoms return for fever return for recurrent low blood pressure.  Can appointment follow-up with your doctor.

## 2020-12-25 NOTE — Progress Notes (Signed)
Manufacturing engineer [facility])      This patient is active with [facility] for hospice services, with a terminal diagnosis of multiple sclerosis. [facility] sees pt daily for dressing changes to sacral wound.  Dressing has not been changed today.  [facility] was not notified of transfer until after pt had been sent to [facility].  [facility] will continue to follow for any discharge planning needs and to coordinate continuation of hospice care.     Thank you for the opportunity to participate in this patient's care.     Domenic Moras, BSN, RN [facility] Liaison [phone] (24h on call)

## 2020-12-25 NOTE — ED Provider Notes (Signed)
[facility] EMERGENCY DEPARTMENT Provider Note   CSN: [id number] Arrival date & time: 12/25/20  1027     History Chief Complaint  Patient presents with   Near Syncope    Faith [name] is a 65 y.o. female.  Patient brought in today by EMS for diarrhea and weakness.  Patient feels that she is dehydrated.  Patient was admitted September 12 through 14 for what was presumed to be sepsis.  Really they determined more of an acute or chronic osteomyelitis.  Seen by infectious disease recommended no further antibiotics.  Patient initially was treated for the sepsis with Vanco and cefepime.  Discharged with hospice and home health nurse.  Patient known to have a chronic sacral decubitus stage IV.  Was seen by general surgery during hospitalization 2 and they felt that there was no immediate wound care needed.  Patient has a history of COPD and is bed ridden mostly due to having multiple sclerosis.  And as mentioned ID recommended no further antibiotics since they felt the osteowas chronic.  EMS reported that patient had an initial systolic blood pressure of 77.  Supposedly after just 200 mL of normal saline it came up to 101.  Patient did arrive here with a blood pressure of 90/71.  Patient was not tachycardic no fever.  Not tachypneic.  Oxygen saturation is 99%.  Regarding the diarrhea patient states she just has about 2 loose bowel movements a day.  No abdominal pain.  No blood.  Patient feels that home health nurse feel that sacral wound is unchanged.      Past Medical History:  Diagnosis Date   Anemia    my whole life, Used to take iron   Arthritis    spine, lumbar   COPD (chronic obstructive pulmonary disease) (HCC)    Emphysema of lung ([place])    HTN (hypertension) 03/01/2020   Hyperlipidemia 11/28/2019   Narcolepsy    by history, has nightmares   Neuromuscular disorder ([place])    leg issues from spine    Patient Active Problem List   Diagnosis Date Noted   Acute osteomyelitis ([place])  12/10/2020   Malnutrition of moderate degree 03/29/2020   Bacteremia    Sacral decubitus ulcer, stage IV (HCC)    Acute appendicitis    Decubital ulcer 03/13/2020   SIRS (systemic inflammatory response syndrome) ([place]) 03/13/2020   Bilateral leg weakness 03/01/2020   COPD (chronic obstructive pulmonary disease) ([place]) 03/01/2020   HTN (hypertension) 03/01/2020   Pressure injury of right buttock, stage 2 ([place]) 02/08/2020   PVD (peripheral vascular disease) ([place]) 11/28/2019   Hyperlipidemia 11/28/2019   Wheelchair dependence 09/24/2019   Cervical myelopathy ([place]) 01/22/2019   Spinal cord compression due to degenerative disorder of spinal column 01/20/2019   Synovial cyst    Spondylolisthesis, cervical region 01/19/2019   Chronic back pain 01/18/2019   Multiple sclerosis exacerbation ([place]) 01/17/2019   Erythrocytosis 09/13/2018   Vitamin D deficiency 03/09/2016   Multiple sclerosis ([place]) 02/24/2016   Tobacco use 12/11/2015   Aortic atherosclerosis ([place]) 12/11/2015   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   Falls frequently 12/11/2015   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   Other emphysema ([place]) 12/11/2015    Past Surgical History:  Procedure Laterality Date   ANTERIOR CERVICAL DECOMP/DISCECTOMY FUSION N/A 01/19/2019   Procedure: ANTERIOR CERVICAL DECOMPRESSION/DISCECTOMY Cervical three - four;  Surgeon: Ashok Pall, MD;  Location: [facility];  Service: Neurosurgery;  Laterality: N/A;   EYE SURGERY  Right    ORIF FACIAL FRACTURE     POSTERIOR CERVICAL LAMINECTOMY N/A 01/19/2019   Procedure: POSTERIOR CERVICAL LAMINECTOMY Removal of Synovial Cyst and posterior cervical fusion;  Surgeon: Ashok Pall, MD;  Location: [place];  Service: Neurosurgery;  Laterality: N/A;   TEE WITHOUT CARDIOVERSION N/A 03/17/2020   Procedure: TRANSESOPHAGEAL ECHOCARDIOGRAM (TEE) WITH PROPOFOL;  Surgeon: Satira Sark, MD;  [facility];  Service:  Cardiovascular;  Laterality: N/A;   TUBAL LIGATION     tubaligation       OB History     Gravida  3   Para  3   Term  3   Preterm      AB      Living         SAB      IAB      Ectopic      Multiple      Live Births              Family History  Problem Relation Age of Onset   Heart attack Mother    Kidney failure Mother    Diabetes Mother    Heart disease Mother    Hyperlipidemia Mother    Hypertension Mother    Heart attack Father 19   Hypertension Father    Hypertension Sister    Diabetes Sister    Other Brother        house fire   Hypertension Brother    Seizures Brother    Heart disease Brother        mitral valve   Arthritis Daughter        DJD, AS fibromyalgia   Polymyositis Daughter    Cancer Maternal Grandfather        lung   Heart disease Maternal Grandfather    Other Paternal Grandmother        house fire   Alcohol abuse Son     Social History   Tobacco Use   Smoking status: Every Day    Packs/day: 0.25    Years: 45.00    Pack years: 11.25    Types: Cigarettes   Smokeless tobacco: Never   Tobacco comments:    cutting down  Vaping Use   Vaping Use: Never used  Substance Use Topics   Alcohol use: Yes    Alcohol/week: 1.0 standard drink    Types: 1 Cans of beer per week   Drug use: No    Home Medications Prior to Admission medications   Medication Sig Start Date End Date Taking? Authorizing Provider  baclofen (LIORESAL) 10 MG tablet Take 1 tablet (10 mg total) by mouth 3 (three) times daily. 09/24/19  Yes Lovorn, Jinny Blossom, MD  celecoxib (CELEBREX) 200 MG capsule Take 1 capsule (200 mg total) by mouth 2 (two) times daily. 08/18/20  Yes Gosrani, Nimish C, MD  Cholecalciferol 50 MCG (2000 UT) TABS Take 2,000 Int'l Units by mouth daily.   Yes [provider]  diazepam (VALIUM) 5 MG tablet TAKE (1) TABLET BY MOUTH EVERY SIX HOURS AS NEEDED FOR MUSCLE SPASMS. 09/29/20  Yes Lovorn, Jinny Blossom, MD  feeding supplement (ENSURE  ENLIVE / ENSURE PLUS) LIQD Take 237 mLs by mouth 3 (three) times daily between meals. 12/12/20  Yes Alma Friendly, MD  gabapentin (NEURONTIN) 300 MG capsule Take 2 capsules (600 mg total) by mouth 3 (three) times daily. 02/05/19  Yes Angiulli, Lavon Paganini, PA-C  HYDROcodone-acetaminophen (NORCO/VICODIN) 5-325 MG tablet Take 1 tablet by mouth  every 6 (six) hours as needed for moderate pain. 09/11/20  Yes Gosrani, Nimish C, MD  ibuprofen (ADVIL) 800 MG tablet Take 800 mg by mouth every 8 (eight) hours as needed for fever, headache or mild pain.   Yes [provider]  metroNIDAZOLE (FLAGYL) 500 MG tablet 500 mg daily. Topical for wound 11/18/20  Yes [provider]  Nutritional Supplements (,FEEDING SUPPLEMENT, PROSOURCE PLUS) liquid Take 30 mLs by mouth 2 (two) times daily between meals. 12/12/20 01/11/21 Yes Alma Friendly, MD  polyethylene glycol (MIRALAX) 17 g packet Take 17 g by mouth daily. 12/25/20  Yes Fredia Sorrow, MD  propranolol (INDERAL) 40 MG tablet Take 1 tablet (40 mg total) by mouth 2 (two) times daily. 08/26/20  Yes Gosrani, Nimish C, MD  atorvastatin (LIPITOR) 20 MG tablet Take 1 tablet (20 mg total) by mouth daily. Patient not taking: No sig reported 01/30/20   Ailene Ards, NP    Allergies    Darvon [propoxyphene] and Penicillins  Review of Systems   Review of Systems  Constitutional:  Positive for fatigue. Negative for chills and fever.  HENT:  Negative for ear pain and sore throat.   Eyes:  Negative for pain and visual disturbance.  Respiratory:  Negative for cough and shortness of breath.   Cardiovascular:  Negative for chest pain and palpitations.  Gastrointestinal:  Positive for diarrhea. Negative for abdominal pain, blood in stool and vomiting.  Genitourinary:  Negative for dysuria and hematuria.  Musculoskeletal:  Negative for arthralgias and back pain.  Skin:  Negative for color change, rash and wound.  Neurological:  Positive for  weakness. Negative for seizures and syncope.  All other systems reviewed and are negative.  Physical Exam Updated Vital Signs BP 118/74   Pulse 72   Temp 97.8 F (36.6 C) (Oral)   Resp 17   Ht 1.626 m (5\' 4" )   Wt 46 kg   SpO2 99%   BMI 17.41 kg/m   Physical Exam Vitals and nursing note reviewed.  Constitutional:      General: She is not in acute distress.    Appearance: Normal appearance. She is well-developed.  HENT:     Head: Normocephalic and atraumatic.     Mouth/Throat:     Mouth: Mucous membranes are dry.  Eyes:     Extraocular Movements: Extraocular movements intact.     Conjunctiva/sclera: Conjunctivae normal.     Pupils: Pupils are equal, round, and reactive to light.  Cardiovascular:     Rate and Rhythm: Normal rate and regular rhythm.     Heart sounds: No murmur heard. Pulmonary:     Effort: Pulmonary effort is normal. No respiratory distress.     Breath sounds: Normal breath sounds.  Abdominal:     General: There is no distension.     Palpations: Abdomen is soft.     Tenderness: There is no abdominal tenderness.  Musculoskeletal:        General: No swelling. Normal range of motion.     Cervical back: Normal range of motion and neck supple.     Comments: Stage IV sacral decubitus.  Skin:    General: Skin is warm and dry.  Neurological:     Mental Status: She is alert and oriented to person, place, and time. Mental status is at baseline.    ED Results / Procedures / Treatments   Labs (all labs ordered are listed, but only abnormal results are displayed) Labs Reviewed  COMPREHENSIVE METABOLIC  PANEL - Abnormal; Notable for the following components:      Result Value   Potassium 5.2 (*)    BUN 27 (*)    All other components within normal limits  CBC WITH DIFFERENTIAL/PLATELET - Abnormal; Notable for the following components:   WBC 16.2 (*)    RDW 17.0 (*)    Neutro Abs 12.8 (*)    Monocytes Absolute 1.3 (*)    All other components within normal  limits  CULTURE, BLOOD (ROUTINE X 2)  CULTURE, BLOOD (ROUTINE X 2)  C DIFFICILE QUICK SCREEN W PCR REFLEX    LIPASE, BLOOD  LACTIC ACID, PLASMA    EKG None  Radiology CT Abdomen Pelvis W Contrast  Result Date: 12/25/2020 CLINICAL DATA:  Abdominal pain, diarrhea and weakness, recent antibiotic treatment, dehydration, terminal MS EXAM: CT ABDOMEN AND PELVIS WITH CONTRAST TECHNIQUE: Multidetector CT imaging of the abdomen and pelvis was performed using the standard protocol following bolus administration of intravenous contrast. Sagittal and coronal MPR images reconstructed from axial data set. CONTRAST:  130mL OMNIPAQUE IOHEXOL 300 MG/ML SOLN IV. No oral contrast. COMPARISON:  12/10/2020 FINDINGS: Lower chest: Dependent atelectasis at both lung bases greater on RIGHT. Hepatobiliary: Low-attenuation lesion lateral segment LEFT lobe liver 8 mm diameter indeterminate but unchanged. Remainder of liver unremarkable. Noncalcified gallstones without gallbladder wall thickening. Pancreas: Normal appearance Spleen: Normal appearance Adrenals/Urinary Tract: Adrenal thickening without mass. Kidneys, ureters, and bladder normal appearance Stomach/Bowel: Appendix not visualized but no pericecal inflammatory process seen. Stomach decompressed. Increased stool throughout colon especially rectum. Remaining bowel loops unremarkable. Vascular/Lymphatic: Atherosclerotic calcifications aorta and iliac arteries without aneurysm. No adenopathy. Scattered pelvic phleboliths. Multiple enhancing veins particularly in LEFT adnexa, can be seen with pelvic congestion syndrome. Reproductive: Atrophic uterus with unremarkable LEFT ovary. RIGHT ovary not seen. Other: No free air or free fluid. No hernia or inflammatory process. Musculoskeletal: Demineralized IMPRESSION: Increased stool throughout colon especially rectum. Multiple enhancing veins particularly in LEFT adnexa, can be seen with pelvic congestion syndrome. Cholelithiasis.  No acute process identified. Aortic Atherosclerosis (ICD10-I70.0). Electronically Signed   By: Lavonia Dana M.D.   On: 12/25/2020 14:34    Procedures Procedures   Medications Ordered in ED Medications  0.9 %  sodium chloride infusion ( Intravenous New Bag/Given 12/25/20 1312)  sodium chloride 0.9 % bolus 1,000 mL (0 mLs Intravenous Stopped 12/25/20 1451)  iohexol (OMNIPAQUE) 300 MG/ML solution 100 mL (100 mLs Intravenous Contrast Given 12/25/20 1409)    ED Course  I have reviewed the triage vital signs and the nursing notes.  Pertinent labs & imaging results that were available during my care of the patient were reviewed by me and considered in my medical decision making (see chart for details).    MDM Rules/Calculators/A&P                         Patient's blood pressure has stabilized here.  All blood pressures have been 90 or better.  Currently blood pressures in the low 100s.  Work-up without any acute findings.  CT scan of the abdomen showed the decubitus to be somewhat stable.  There are no acute findings there.  Small intestines was normal.  And large intestines though did have a stool burden.  But no signs of obstruction.  Patient's diarrhea may have been some loose stool moving around the constipation.  We will treat with MiraLAX daily for this.  Patient still has a leukocytosis.  But lactic acid is normal  which rules out serious infection.  Not febrile here.  Not tachycardic.  Patient's did have blood cultures done they are pending.  White blood cell count still 16.2 it was 17.2 when she was admitted before but this could be just due to the chronic osteomyelitis.  Hemoglobin stable at 13.5.  T did show evidence of some cholelithiasis but nothing consistent with acute cholecystitis.   Patient stable for discharge home with precautions.  I feel that blood pressures have been reasonably stable here.  It is somewhat questionable that initial blood pressure was 77 and came up to 101 which  is 200 cc of fluid.  But overall I think patient is status quo.  And safe for discharge home with precautions.  Final Clinical Impression(s) / ED Diagnoses Final diagnoses:  Dehydration  Constipation, unspecified constipation type    Rx / DC Orders ED Discharge Orders          Ordered    polyethylene glycol (MIRALAX) 17 g packet  Daily        12/25/20 1612             Fredia Sorrow, MD 12/25/20 1620

## 2020-12-25 NOTE — ED Notes (Signed)
Secretary advised she needs an ambulance home.

## 2020-12-25 NOTE — ED Notes (Signed)
Pt transported to CT ?

## 2020-12-26 ENCOUNTER — Telehealth: Payer: Self-pay

## 2020-12-26 DIAGNOSIS — M545 Low back pain, unspecified: Secondary | ICD-10-CM | POA: Diagnosis not present

## 2020-12-26 DIAGNOSIS — G47419 Narcolepsy without cataplexy: Secondary | ICD-10-CM | POA: Diagnosis not present

## 2020-12-26 DIAGNOSIS — R000 Tachycardia, unspecified: Secondary | ICD-10-CM | POA: Diagnosis not present

## 2020-12-26 DIAGNOSIS — E46 Unspecified protein-calorie malnutrition: Secondary | ICD-10-CM | POA: Diagnosis not present

## 2020-12-26 DIAGNOSIS — Z681 Body mass index (BMI) 19 or less, adult: Secondary | ICD-10-CM | POA: Diagnosis not present

## 2020-12-26 DIAGNOSIS — L89153 Pressure ulcer of sacral region, stage 3: Secondary | ICD-10-CM | POA: Diagnosis not present

## 2020-12-26 DIAGNOSIS — K8020 Calculus of gallbladder without cholecystitis without obstruction: Secondary | ICD-10-CM | POA: Diagnosis not present

## 2020-12-26 DIAGNOSIS — Z8614 Personal history of Methicillin resistant Staphylococcus aureus infection: Secondary | ICD-10-CM | POA: Diagnosis not present

## 2020-12-26 DIAGNOSIS — L89159 Pressure ulcer of sacral region, unspecified stage: Secondary | ICD-10-CM | POA: Diagnosis not present

## 2020-12-26 DIAGNOSIS — D649 Anemia, unspecified: Secondary | ICD-10-CM | POA: Diagnosis not present

## 2020-12-26 DIAGNOSIS — R296 Repeated falls: Secondary | ICD-10-CM | POA: Diagnosis not present

## 2020-12-26 DIAGNOSIS — J449 Chronic obstructive pulmonary disease, unspecified: Secondary | ICD-10-CM | POA: Diagnosis not present

## 2020-12-26 DIAGNOSIS — I10 Essential (primary) hypertension: Secondary | ICD-10-CM | POA: Diagnosis not present

## 2020-12-26 DIAGNOSIS — N858 Other specified noninflammatory disorders of uterus: Secondary | ICD-10-CM | POA: Diagnosis not present

## 2020-12-26 DIAGNOSIS — M549 Dorsalgia, unspecified: Secondary | ICD-10-CM | POA: Diagnosis not present

## 2020-12-26 DIAGNOSIS — G35 Multiple sclerosis: Secondary | ICD-10-CM | POA: Diagnosis not present

## 2020-12-26 DIAGNOSIS — E785 Hyperlipidemia, unspecified: Secondary | ICD-10-CM | POA: Diagnosis not present

## 2020-12-26 NOTE — Telephone Encounter (Signed)
Transition Care Management Unsuccessful Follow-up Telephone Call  Date of discharge and from where:  12/25/2020-[facility]   Attempts:  1st Attempt  Reason for unsuccessful TCM follow-up call:  Left voice message

## 2020-12-27 DIAGNOSIS — R296 Repeated falls: Secondary | ICD-10-CM | POA: Diagnosis not present

## 2020-12-27 DIAGNOSIS — G35 Multiple sclerosis: Secondary | ICD-10-CM | POA: Diagnosis not present

## 2020-12-27 DIAGNOSIS — E785 Hyperlipidemia, unspecified: Secondary | ICD-10-CM | POA: Diagnosis not present

## 2020-12-27 DIAGNOSIS — Z681 Body mass index (BMI) 19 or less, adult: Secondary | ICD-10-CM | POA: Diagnosis not present

## 2020-12-27 DIAGNOSIS — J449 Chronic obstructive pulmonary disease, unspecified: Secondary | ICD-10-CM | POA: Diagnosis not present

## 2020-12-27 DIAGNOSIS — D649 Anemia, unspecified: Secondary | ICD-10-CM | POA: Diagnosis not present

## 2020-12-27 DIAGNOSIS — G47419 Narcolepsy without cataplexy: Secondary | ICD-10-CM | POA: Diagnosis not present

## 2020-12-27 DIAGNOSIS — I10 Essential (primary) hypertension: Secondary | ICD-10-CM | POA: Diagnosis not present

## 2020-12-27 DIAGNOSIS — Z8614 Personal history of Methicillin resistant Staphylococcus aureus infection: Secondary | ICD-10-CM | POA: Diagnosis not present

## 2020-12-27 DIAGNOSIS — L89153 Pressure ulcer of sacral region, stage 3: Secondary | ICD-10-CM | POA: Diagnosis not present

## 2020-12-27 DIAGNOSIS — E46 Unspecified protein-calorie malnutrition: Secondary | ICD-10-CM | POA: Diagnosis not present

## 2020-12-28 DIAGNOSIS — L89154 Pressure ulcer of sacral region, stage 4: Secondary | ICD-10-CM | POA: Diagnosis not present

## 2020-12-28 DIAGNOSIS — L89153 Pressure ulcer of sacral region, stage 3: Secondary | ICD-10-CM | POA: Diagnosis not present

## 2020-12-28 DIAGNOSIS — Z681 Body mass index (BMI) 19 or less, adult: Secondary | ICD-10-CM | POA: Diagnosis not present

## 2020-12-28 DIAGNOSIS — Z8614 Personal history of Methicillin resistant Staphylococcus aureus infection: Secondary | ICD-10-CM | POA: Diagnosis not present

## 2020-12-28 DIAGNOSIS — E46 Unspecified protein-calorie malnutrition: Secondary | ICD-10-CM | POA: Diagnosis not present

## 2020-12-28 DIAGNOSIS — D649 Anemia, unspecified: Secondary | ICD-10-CM | POA: Diagnosis not present

## 2020-12-28 DIAGNOSIS — E876 Hypokalemia: Secondary | ICD-10-CM | POA: Diagnosis not present

## 2020-12-28 DIAGNOSIS — G47419 Narcolepsy without cataplexy: Secondary | ICD-10-CM | POA: Diagnosis not present

## 2020-12-28 DIAGNOSIS — J449 Chronic obstructive pulmonary disease, unspecified: Secondary | ICD-10-CM | POA: Diagnosis not present

## 2020-12-28 DIAGNOSIS — E785 Hyperlipidemia, unspecified: Secondary | ICD-10-CM | POA: Diagnosis not present

## 2020-12-28 DIAGNOSIS — R296 Repeated falls: Secondary | ICD-10-CM | POA: Diagnosis not present

## 2020-12-28 DIAGNOSIS — I10 Essential (primary) hypertension: Secondary | ICD-10-CM | POA: Diagnosis not present

## 2020-12-28 DIAGNOSIS — G35 Multiple sclerosis: Secondary | ICD-10-CM | POA: Diagnosis not present

## 2020-12-29 DIAGNOSIS — Z681 Body mass index (BMI) 19 or less, adult: Secondary | ICD-10-CM | POA: Diagnosis not present

## 2020-12-29 DIAGNOSIS — J449 Chronic obstructive pulmonary disease, unspecified: Secondary | ICD-10-CM | POA: Diagnosis not present

## 2020-12-29 DIAGNOSIS — E46 Unspecified protein-calorie malnutrition: Secondary | ICD-10-CM | POA: Diagnosis not present

## 2020-12-29 DIAGNOSIS — D649 Anemia, unspecified: Secondary | ICD-10-CM | POA: Diagnosis not present

## 2020-12-29 DIAGNOSIS — I10 Essential (primary) hypertension: Secondary | ICD-10-CM | POA: Diagnosis not present

## 2020-12-29 DIAGNOSIS — L89153 Pressure ulcer of sacral region, stage 3: Secondary | ICD-10-CM | POA: Diagnosis not present

## 2020-12-29 DIAGNOSIS — E785 Hyperlipidemia, unspecified: Secondary | ICD-10-CM | POA: Diagnosis not present

## 2020-12-29 DIAGNOSIS — G35 Multiple sclerosis: Secondary | ICD-10-CM | POA: Diagnosis not present

## 2020-12-29 DIAGNOSIS — Z8614 Personal history of Methicillin resistant Staphylococcus aureus infection: Secondary | ICD-10-CM | POA: Diagnosis not present

## 2020-12-29 DIAGNOSIS — R296 Repeated falls: Secondary | ICD-10-CM | POA: Diagnosis not present

## 2020-12-29 DIAGNOSIS — G47419 Narcolepsy without cataplexy: Secondary | ICD-10-CM | POA: Diagnosis not present

## 2020-12-30 LAB — CULTURE, BLOOD (ROUTINE X 2)
Culture: NO GROWTH
Culture: NO GROWTH

## 2020-12-30 NOTE — Telephone Encounter (Signed)
Transition Care Management Unsuccessful Follow-up Telephone Call  Date of discharge and from where:  12/25/2020 from [facility]  Attempts:  2nd Attempt  Reason for unsuccessful TCM follow-up call:  Left voice message

## 2020-12-31 DIAGNOSIS — G35 Multiple sclerosis: Secondary | ICD-10-CM | POA: Diagnosis not present

## 2020-12-31 NOTE — Telephone Encounter (Signed)
Transition Care Management Unsuccessful Follow-up Telephone Call  Date of discharge and from where:  12/25/2020 from [facility]  Attempts:  3rd Attempt  Reason for unsuccessful TCM follow-up call:  Unable to reach patient

## 2021-01-01 DIAGNOSIS — G35 Multiple sclerosis: Secondary | ICD-10-CM | POA: Diagnosis not present

## 2021-01-02 DIAGNOSIS — G35 Multiple sclerosis: Secondary | ICD-10-CM | POA: Diagnosis not present

## 2021-01-05 DIAGNOSIS — G35 Multiple sclerosis: Secondary | ICD-10-CM | POA: Diagnosis not present

## 2021-01-06 DIAGNOSIS — G35 Multiple sclerosis: Secondary | ICD-10-CM | POA: Diagnosis not present

## 2021-01-07 DIAGNOSIS — G35 Multiple sclerosis: Secondary | ICD-10-CM | POA: Diagnosis not present

## 2021-01-08 DIAGNOSIS — G35 Multiple sclerosis: Secondary | ICD-10-CM | POA: Diagnosis not present

## 2021-02-02 ENCOUNTER — Other Ambulatory Visit: Payer: Self-pay | Admitting: Hospitalist

## 2021-02-02 DIAGNOSIS — J449 Chronic obstructive pulmonary disease, unspecified: Secondary | ICD-10-CM

## 2021-02-03 ENCOUNTER — Other Ambulatory Visit: Payer: Self-pay | Admitting: Hospitalist

## 2021-02-03 ENCOUNTER — Other Ambulatory Visit: Payer: Self-pay | Admitting: Neurology

## 2021-02-03 DIAGNOSIS — Z1231 Encounter for screening mammogram for malignant neoplasm of breast: Secondary | ICD-10-CM

## 2021-02-06 ENCOUNTER — Other Ambulatory Visit: Payer: Self-pay | Admitting: Hospitalist

## 2021-02-06 ENCOUNTER — Other Ambulatory Visit (HOSPITAL_COMMUNITY): Payer: Self-pay | Admitting: Hospitalist

## 2021-02-06 DIAGNOSIS — M810 Age-related osteoporosis without current pathological fracture: Secondary | ICD-10-CM

## 2021-02-06 DIAGNOSIS — J449 Chronic obstructive pulmonary disease, unspecified: Secondary | ICD-10-CM

## 2021-02-06 DIAGNOSIS — Z1231 Encounter for screening mammogram for malignant neoplasm of breast: Secondary | ICD-10-CM

## 2021-02-14 ENCOUNTER — Emergency Department (HOSPITAL_COMMUNITY): Payer: Medicare (Managed Care)

## 2021-02-14 ENCOUNTER — Observation Stay (HOSPITAL_COMMUNITY)
Admission: EM | Admit: 2021-02-14 | Discharge: 2021-02-17 | Disposition: A | Discharge status: Skilled Nursing Facility | Payer: Medicare (Managed Care) | Attending: Internal Medicine | Admitting: Internal Medicine

## 2021-02-14 ENCOUNTER — Other Ambulatory Visit: Payer: Self-pay

## 2021-02-14 ENCOUNTER — Encounter (HOSPITAL_COMMUNITY): Payer: Self-pay | Admitting: Emergency Medicine

## 2021-02-14 DIAGNOSIS — D649 Anemia, unspecified: Secondary | ICD-10-CM | POA: Insufficient documentation

## 2021-02-14 DIAGNOSIS — E860 Dehydration: Secondary | ICD-10-CM | POA: Diagnosis not present

## 2021-02-14 DIAGNOSIS — E876 Hypokalemia: Secondary | ICD-10-CM | POA: Diagnosis not present

## 2021-02-14 DIAGNOSIS — L89153 Pressure ulcer of sacral region, stage 3: Secondary | ICD-10-CM | POA: Insufficient documentation

## 2021-02-14 DIAGNOSIS — Z79899 Other long term (current) drug therapy: Secondary | ICD-10-CM | POA: Insufficient documentation

## 2021-02-14 DIAGNOSIS — I10 Essential (primary) hypertension: Secondary | ICD-10-CM | POA: Diagnosis not present

## 2021-02-14 DIAGNOSIS — U071 COVID-19: Secondary | ICD-10-CM | POA: Diagnosis not present

## 2021-02-14 DIAGNOSIS — F17210 Nicotine dependence, cigarettes, uncomplicated: Secondary | ICD-10-CM | POA: Insufficient documentation

## 2021-02-14 DIAGNOSIS — J449 Chronic obstructive pulmonary disease, unspecified: Secondary | ICD-10-CM | POA: Diagnosis not present

## 2021-02-14 DIAGNOSIS — E872 Acidosis, unspecified: Secondary | ICD-10-CM | POA: Diagnosis not present

## 2021-02-14 DIAGNOSIS — G35 Multiple sclerosis: Secondary | ICD-10-CM | POA: Diagnosis present

## 2021-02-14 DIAGNOSIS — I959 Hypotension, unspecified: Secondary | ICD-10-CM | POA: Insufficient documentation

## 2021-02-14 DIAGNOSIS — L8990 Pressure ulcer of unspecified site, unspecified stage: Secondary | ICD-10-CM | POA: Insufficient documentation

## 2021-02-14 DIAGNOSIS — R627 Adult failure to thrive: Secondary | ICD-10-CM

## 2021-02-14 DIAGNOSIS — R531 Weakness: Secondary | ICD-10-CM | POA: Diagnosis present

## 2021-02-14 DIAGNOSIS — R5381 Other malaise: Secondary | ICD-10-CM

## 2021-02-14 DIAGNOSIS — E782 Mixed hyperlipidemia: Secondary | ICD-10-CM

## 2021-02-14 LAB — BASIC METABOLIC PANEL
Anion gap: 7 (ref 5–15)
BUN: 27 mg/dL — ABNORMAL HIGH (ref 8–23)
CO2: 26 mmol/L (ref 22–32)
Calcium: 7.8 mg/dL — ABNORMAL LOW (ref 8.9–10.3)
Chloride: 104 mmol/L (ref 98–111)
Creatinine, Ser: 0.88 mg/dL (ref 0.44–1.00)
GFR, Estimated: >60 mL/min (ref >60)
Glucose, Bld: 126 mg/dL — ABNORMAL HIGH (ref 70–99)
Potassium: 3.4 mmol/L — ABNORMAL LOW (ref 3.5–5.1)
Sodium: 137 mmol/L (ref 135–145)

## 2021-02-14 LAB — CBC
HCT: 33.5 % — ABNORMAL LOW (ref 36.0–46.0)
Hemoglobin: 10.7 g/dL — ABNORMAL LOW (ref 12.0–15.0)
MCH: 30.6 pg (ref 26.0–34.0)
MCHC: 31.9 g/dL (ref 30.0–36.0)
MCV: 95.7 fL (ref 80.0–100.0)
Platelets: 188 10*3/uL (ref 150–400)
RBC: 3.5 MIL/uL — ABNORMAL LOW (ref 3.87–5.11)
RDW: 14.8 % (ref 11.5–15.5)
WBC: 8.1 10*3/uL (ref 4.0–10.5)
nRBC: 0 % (ref 0.0–0.2)

## 2021-02-14 LAB — LACTIC ACID, PLASMA: Lactic Acid, Venous: 2.1 mmol/L (ref 0.5–1.9)

## 2021-02-14 LAB — CBG MONITORING, ED: Glucose-Capillary: 121 mg/dL — ABNORMAL HIGH (ref 70–99)

## 2021-02-14 MED ORDER — SODIUM CHLORIDE 0.9 % IV BOLUS
1000.0000 mL | Freq: Once | INTRAVENOUS | Status: AC
  Administered 2021-02-14: 1000 mL via INTRAVENOUS

## 2021-02-14 NOTE — ED Provider Notes (Signed)
[facility] EMERGENCY DEPARTMENT Provider Note   CSN: [id number] Arrival date & time: 02/14/21  2125     History Chief Complaint  Patient presents with   Weakness    Faith [name] is a 65 y.o. female.  HPI She presents by EMS for evaluation of general weakness.  She has also been taking in less orally than usual.  She is receiving ongoing wound care for sacral wound.  EMS found her blood pressure low at 80/60 with heart rate in the high 50s.  Oxygen saturation was low at 87% on room air on their arrival.  On nasal cannula oxygen, her oxygenation improved.  Patient states she has been generally weak and sleepy today.  She attributes that to being weak after a car ride to [facility] yesterday.  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  She denies nausea, vomiting, cough, shortness of breath, or constipation.  She states she has not been eating as much as usual today.  She denies other recent illnesses.    Past Medical History:  Diagnosis Date   Anemia    my whole life, Used to take iron   Arthritis    spine, lumbar   COPD (chronic obstructive pulmonary disease) (HCC)    Emphysema of lung ([place])    HTN (hypertension) 03/01/2020   Hyperlipidemia 11/28/2019   Narcolepsy    by history, has nightmares   Neuromuscular disorder ([place])    leg issues from spine    Patient Active Problem List   Diagnosis Date Noted   Acute osteomyelitis ([place]) 12/10/2020   Malnutrition of moderate degree 03/29/2020   Bacteremia    Sacral decubitus ulcer, stage IV (HCC)    Acute appendicitis    Decubital ulcer 03/13/2020   SIRS (systemic inflammatory response syndrome) ([place]) 03/13/2020   Bilateral leg weakness 03/01/2020   COPD (chronic obstructive pulmonary disease) ([place]) 03/01/2020   HTN (hypertension) 03/01/2020   Pressure injury of right buttock, stage 2 ([place]) 02/08/2020   PVD (peripheral vascular disease) ([place]) 11/28/2019   Hyperlipidemia 11/28/2019   Wheelchair  dependence 09/24/2019   Cervical myelopathy ([place]) 01/22/2019   Spinal cord compression due to degenerative disorder of spinal column 01/20/2019   Synovial cyst    Spondylolisthesis, cervical region 01/19/2019   Chronic back pain 01/18/2019   Multiple sclerosis exacerbation ([place]) 01/17/2019   Erythrocytosis 09/13/2018   Vitamin D deficiency 03/09/2016   Multiple sclerosis ([place]) 02/24/2016   Tobacco use 12/11/2015   Aortic atherosclerosis ([place]) 12/11/2015   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   Falls frequently 12/11/2015   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   Other emphysema ([place]) 12/11/2015    Past Surgical History:  Procedure Laterality Date   ANTERIOR CERVICAL DECOMP/DISCECTOMY FUSION N/A 01/19/2019   Procedure: ANTERIOR CERVICAL DECOMPRESSION/DISCECTOMY Cervical three - four;  Surgeon: Ashok Pall, MD;  Location: [place];  Service: Neurosurgery;  Laterality: N/A;   EYE SURGERY Right    ORIF FACIAL FRACTURE     POSTERIOR CERVICAL LAMINECTOMY N/A 01/19/2019   Procedure: POSTERIOR CERVICAL LAMINECTOMY Removal of Synovial Cyst and posterior cervical fusion;  Surgeon: Ashok Pall, MD;  Location: [place];  Service: Neurosurgery;  Laterality: N/A;   TEE WITHOUT CARDIOVERSION N/A 03/17/2020   Procedure: TRANSESOPHAGEAL ECHOCARDIOGRAM (TEE) WITH PROPOFOL;  Surgeon: Satira Sark, MD;  [facility];  Service: Cardiovascular;  Laterality: N/A;   TUBAL LIGATION     tubaligation  OB History     Gravida  3   Para  3   Term  3   Preterm      AB      Living         SAB      IAB      Ectopic      Multiple      Live Births              Family History  Problem Relation Age of Onset   Heart attack Mother    Kidney failure Mother    Diabetes Mother    Heart disease Mother    Hyperlipidemia Mother    Hypertension Mother    Heart attack Father 49   Hypertension Father    Hypertension Sister    Diabetes  Sister    Other Brother        house fire   Hypertension Brother    Seizures Brother    Heart disease Brother        mitral valve   Arthritis Daughter        DJD, AS fibromyalgia   Polymyositis Daughter    Cancer Maternal Grandfather        lung   Heart disease Maternal Grandfather    Other Paternal Grandmother        house fire   Alcohol abuse Son     Social History   Tobacco Use   Smoking status: Every Day    Packs/day: 0.25    Years: 45.00    Pack years: 11.25    Types: Cigarettes   Smokeless tobacco: Never   Tobacco comments:    cutting down  Vaping Use   Vaping Use: Never used  Substance Use Topics   Alcohol use: Yes    Alcohol/week: 1.0 standard drink    Types: 1 Cans of beer per week   Drug use: No    Home Medications Prior to Admission medications   Medication Sig Start Date End Date Taking? Authorizing Provider  atorvastatin (LIPITOR) 20 MG tablet Take 1 tablet (20 mg total) by mouth daily. Patient not taking: No sig reported 01/30/20   Ailene Ards, NP  baclofen (LIORESAL) 10 MG tablet Take 1 tablet (10 mg total) by mouth 3 (three) times daily. 09/24/19   Lovorn, Jinny Blossom, MD  celecoxib (CELEBREX) 200 MG capsule Take 1 capsule (200 mg total) by mouth 2 (two) times daily. 08/18/20   Doree Albee, MD  Cholecalciferol 50 MCG (2000 UT) TABS Take 2,000 Int'l Units by mouth daily.    [provider]  diazepam (VALIUM) 5 MG tablet TAKE (1) TABLET BY MOUTH EVERY SIX HOURS AS NEEDED FOR MUSCLE SPASMS. 09/29/20   Lovorn, Jinny Blossom, MD  feeding supplement (ENSURE ENLIVE / ENSURE PLUS) LIQD Take 237 mLs by mouth 3 (three) times daily between meals. 12/12/20   Alma Friendly, MD  gabapentin (NEURONTIN) 300 MG capsule Take 2 capsules (600 mg total) by mouth 3 (three) times daily. 02/05/19   Angiulli, Lavon Paganini, PA-C  HYDROcodone-acetaminophen (NORCO/VICODIN) 5-325 MG tablet Take 1 tablet by mouth every 6 (six) hours as needed for moderate pain. 09/11/20   Doree Albee, MD  ibuprofen (ADVIL) 800 MG tablet Take 800 mg by mouth every 8 (eight) hours as needed for fever, headache or mild pain.    [provider]  metroNIDAZOLE (FLAGYL) 500 MG tablet 500 mg daily. Topical for wound 11/18/20   [provider]  polyethylene glycol (MIRALAX) 17 g packet Take 17 g by mouth daily. 12/25/20   Fredia Sorrow, MD  propranolol (INDERAL) 40 MG tablet Take 1 tablet (40 mg total) by mouth 2 (two) times daily. 08/26/20   Doree Albee, MD    Allergies    Darvon [propoxyphene] and Penicillins  Review of Systems   Review of Systems  All other systems reviewed and are negative.  Physical Exam Updated Vital Signs BP (!) 89/70    Pulse 61    Temp 97.6 F (36.4 C) (Oral)    Resp 20    Ht 5\' 4"  (1.626 m)    Wt 46 kg    SpO2 100%    BMI 17.41 kg/m   Physical Exam Vitals and nursing note reviewed.  Constitutional:      General: She is not in acute distress.    Appearance: She is well-developed. She is not ill-appearing, toxic-appearing or diaphoretic.     Comments: Under nourished  HENT:     Head: Normocephalic and atraumatic.     Right Ear: External ear normal.     Left Ear: External ear normal.     Mouth/Throat:     Mouth: Mucous membranes are moist.     Pharynx: No oropharyngeal exudate.  Eyes:     Conjunctiva/sclera: Conjunctivae normal.     Pupils: Pupils are equal, round, and reactive to light.  Neck:     Trachea: Phonation normal.  Cardiovascular:     Rate and Rhythm: Normal rate and regular rhythm.     Heart sounds: Normal heart sounds.  Pulmonary:     Effort: Pulmonary effort is normal.     Breath sounds: Normal breath sounds.  Abdominal:     General: There is no distension.     Palpations: Abdomen is soft.     Tenderness: There is no abdominal tenderness.  Musculoskeletal:        General: Normal range of motion.     Cervical back: Normal range of motion and neck supple.  Skin:    General: Skin is warm and dry.      Comments: Large sacral decubitus, stage III, covered with an appropriate bandage.  Bandages were removed briefly for observation, there is no associated drainage, redness or bleeding from this wound.  Neurological:     Mental Status: She is alert and oriented to person, place, and time.     Cranial Nerves: No cranial nerve deficit.     Sensory: No sensory deficit.     Motor: No abnormal muscle tone.     Coordination: Coordination normal.  Psychiatric:        Mood and Affect: Mood normal.        Behavior: Behavior normal.        Thought Content: Thought content normal.        Judgment: Judgment normal.    ED Results / Procedures / Treatments   Labs (all labs ordered are listed, but only abnormal results are displayed) Labs Reviewed  CBG MONITORING, ED - Abnormal; Notable for the following components:      Result Value   Glucose-Capillary 121 (*)    All other components within normal limits  CULTURE, BLOOD (ROUTINE X 2)  CULTURE, BLOOD (ROUTINE X 2)  RESP PANEL BY RT-PCR (FLU A&B, COVID) ARPGX2  BASIC METABOLIC PANEL  CBC  URINALYSIS, ROUTINE W REFLEX MICROSCOPIC  LACTIC ACID, PLASMA  LACTIC ACID, PLASMA    EKG EKG Interpretation  Date/Time:  Saturday February 14 2021 21:53:29 EST Ventricular Rate:  61 PR Interval:  193 QRS Duration: 96 QT Interval:  473 QTC Calculation: 477 R Axis:   79 Text Interpretation: Sinus rhythm RSR' in V1 or V2, probably normal variant since last tracing no significant change Confirmed by Daleen Bo [phone]) on 02/14/2021 10:02:47 PM  Radiology No results found.  Procedures Procedures   Medications Ordered in ED Medications  sodium chloride 0.9 % bolus 1,000 mL (1,000 mLs Intravenous New Bag/Given 02/14/21 2233)    ED Course  I have reviewed the triage vital signs and the nursing notes.  Pertinent labs & imaging results that were available during my care of the patient were reviewed by me and considered in my medical decision  making (see chart for details).    MDM Rules/Calculators/A&P                          Patient Vitals for the past 24 hrs:  BP Temp Temp src Pulse Resp SpO2 Height Weight  02/14/21 2230 (!) 89/70 -- -- 61 20 100 % -- --  02/14/21 2200 (!) 83/63 -- -- 62 15 100 % -- --  02/14/21 2139 -- 97.6 F (36.4 C) Oral -- -- -- -- --  02/14/21 2135 -- -- -- -- -- -- 5\' 4"  (1.626 m) 46 kg      Medical Decision Making:  This patient is presenting for evaluation of weakness, hypotension, hypoxia and lethargy, which does require a range of treatment options, and is a complaint that involves a moderate risk of morbidity and mortality. The differential diagnoses include acute illness, infection, viral illness, complications from multiple sclerosis. I decided to review old records, and in summary elderly female, living at home, presenting with nonspecific symptoms requiring evaluation.  She has multiple medical problems.  She has chronic pain and is on chronic narcotics.  History of multiple sclerosis, degenerative joint disease, peripheral vascular disease, COPD and hypertension I did not require additional historical information from anyone.  Clinical Laboratory Tests Ordered, included CBC, Metabolic panel, Urinalysis, and lactate, blood cultures, viral panel . Radiologic Tests Ordered, included chest x-ray.  I independently Visualized: Chest x-ray images, which show no acute abnormalities  Cardiac Monitor Tracing which shows normal sinus rhythm    Critical Interventions-clinical evaluation, laboratory testing, radiography, IV fluids, observation    CRITICAL CARE-no Performed by: Daleen Bo  Nursing Notes Reviewed/ Care Coordinated Applicable Imaging Reviewed Interpretation of Laboratory Data incorporated into ED treatment  Care transferred to Dr. Sedonia Small to evaluate after return of labs and consider appropriate disposition.       Final Clinical Impression(s) / ED Diagnoses Final  diagnoses:  COVID-19  Weakness  Hypotension, unspecified hypotension type  Dehydration    Rx / DC Orders ED Discharge Orders     None        Daleen Bo, MD 02/15/21 1651

## 2021-02-14 NOTE — ED Provider Notes (Signed)
°  Provider Note MRN:  [id number]  Arrival date & time: 02/15/21    ED Course and Medical Decision Making  Assumed care from Dr. Eulis Foster at shift change.  EMS called for weakness.  Poor p.o. intake.  Reportedly with some low blood pressures also with hypoxia in the field.  Testing reveals that patient is COVID-positive, likely explaining her reported hypoxia.  On room air at this time, initially hypotensive but responding to fluids, still with some soft pressures.  We will request admission for dehydration, COVID.  Procedures  Final Clinical Impressions(s) / ED Diagnoses     ICD-10-CM   1. COVID-19  U07.1     2. Weakness  R53.1     3. Hypotension, unspecified hypotension type  I95.9     4. Dehydration  E86.0       ED Discharge Orders     None       Discharge Instructions   None     Barth Kirks. Sedonia Small, [place] [email or web address]    Maudie Flakes, MD 02/15/21 [phone]

## 2021-02-14 NOTE — ED Triage Notes (Signed)
Pt brought in by [facility] for c/o weakness over last couple of days. Family states pt has had a decrease in her po intake.  Per EMS, pt has a sacral wound for which she receives wound care for. EMS states pt's BP was 80/60, HR 56-62, and O2 sats were 87% on R/A upon their arrival. Pt placed on O2 @2l  nasal cannula via EMS and given approximately 153ml NS en route. Pt has MS.

## 2021-02-14 NOTE — ED Notes (Signed)
Pt says her BP usually runs around 90 systolic.

## 2021-02-14 NOTE — ED Notes (Signed)
Pt being treated daily at wound care center in [place], pt says dressing was changed yesterday, visualized wound with Dr Eulis Foster, wound bed is pale, no slough or eschar, no signs of infection, no odor. 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 Pt says her son, [name] called EMS because he thought she was "weaker than usual", however, pt says she has been sleeping a lot today, because the trip to [employer] yesterday for wound care "Just wore me out", "so I slept most of the day"

## 2021-02-14 NOTE — ED Notes (Signed)
Pt drinking water per request- pt placed on bed pan in attempt to get urine sample-pt has call bell in hand

## 2021-02-14 NOTE — ED Notes (Signed)
Pt says her BP always runs low

## 2021-02-15 DIAGNOSIS — G35 Multiple sclerosis: Secondary | ICD-10-CM

## 2021-02-15 DIAGNOSIS — U071 COVID-19: Secondary | ICD-10-CM | POA: Diagnosis not present

## 2021-02-15 DIAGNOSIS — E782 Mixed hyperlipidemia: Secondary | ICD-10-CM

## 2021-02-15 DIAGNOSIS — I10 Essential (primary) hypertension: Secondary | ICD-10-CM | POA: Diagnosis not present

## 2021-02-15 DIAGNOSIS — E860 Dehydration: Secondary | ICD-10-CM

## 2021-02-15 DIAGNOSIS — R531 Weakness: Secondary | ICD-10-CM

## 2021-02-15 DIAGNOSIS — R5381 Other malaise: Secondary | ICD-10-CM

## 2021-02-15 DIAGNOSIS — R627 Adult failure to thrive: Secondary | ICD-10-CM | POA: Diagnosis not present

## 2021-02-15 DIAGNOSIS — E872 Acidosis, unspecified: Secondary | ICD-10-CM

## 2021-02-15 DIAGNOSIS — E876 Hypokalemia: Secondary | ICD-10-CM

## 2021-02-15 DIAGNOSIS — L8990 Pressure ulcer of unspecified site, unspecified stage: Secondary | ICD-10-CM | POA: Insufficient documentation

## 2021-02-15 LAB — CBC WITH DIFFERENTIAL/PLATELET
Abs Immature Granulocytes: 0.03 10*3/uL (ref 0.00–0.07)
Basophils Absolute: 0 10*3/uL (ref 0.0–0.1)
Basophils Relative: 0 %
Eosinophils Absolute: 0 10*3/uL (ref 0.0–0.5)
Eosinophils Relative: 0 %
HCT: 31.2 % — ABNORMAL LOW (ref 36.0–46.0)
Hemoglobin: 10 g/dL — ABNORMAL LOW (ref 12.0–15.0)
Immature Granulocytes: 1 %
Lymphocytes Relative: 32 %
Lymphs Abs: 1.8 10*3/uL (ref 0.7–4.0)
MCH: 29.9 pg (ref 26.0–34.0)
MCHC: 32.1 g/dL (ref 30.0–36.0)
MCV: 93.4 fL (ref 80.0–100.0)
Monocytes Absolute: 0.7 10*3/uL (ref 0.1–1.0)
Monocytes Relative: 12 %
Neutro Abs: 3.2 10*3/uL (ref 1.7–7.7)
Neutrophils Relative %: 55 %
Platelets: 181 10*3/uL (ref 150–400)
RBC: 3.34 MIL/uL — ABNORMAL LOW (ref 3.87–5.11)
RDW: 14.7 % (ref 11.5–15.5)
WBC: 5.7 10*3/uL (ref 4.0–10.5)
nRBC: 0 % (ref 0.0–0.2)

## 2021-02-15 LAB — URINALYSIS, ROUTINE W REFLEX MICROSCOPIC
Bilirubin Urine: NEGATIVE
Glucose, UA: NEGATIVE mg/dL
Hgb urine dipstick: NEGATIVE
Ketones, ur: NEGATIVE mg/dL
Leukocytes,Ua: NEGATIVE
Nitrite: NEGATIVE
Protein, ur: 30 mg/dL — AB
Specific Gravity, Urine: 1.024 (ref 1.005–1.030)
pH: 5 (ref 5.0–8.0)

## 2021-02-15 LAB — PHOSPHORUS: Phosphorus: 2.8 mg/dL (ref 2.5–4.6)

## 2021-02-15 LAB — LACTIC ACID, PLASMA: Lactic Acid, Venous: 1.3 mmol/L (ref 0.5–1.9)

## 2021-02-15 LAB — RESP PANEL BY RT-PCR (FLU A&B, COVID) ARPGX2
Influenza A by PCR: NEGATIVE
Influenza B by PCR: NEGATIVE
SARS Coronavirus 2 by RT PCR: POSITIVE — AB

## 2021-02-15 LAB — MAGNESIUM: Magnesium: 1.7 mg/dL (ref 1.7–2.4)

## 2021-02-15 LAB — D-DIMER, QUANTITATIVE: D-Dimer, Quant: 0.73 ug/mL-FEU — ABNORMAL HIGH (ref 0.00–0.50)

## 2021-02-15 LAB — C-REACTIVE PROTEIN: CRP: 0.7 mg/dL (ref <1.0)

## 2021-02-15 LAB — FERRITIN: Ferritin: 45 ng/mL (ref 11–307)

## 2021-02-15 MED ORDER — ASCORBIC ACID 500 MG PO TABS
500.0000 mg | ORAL_TABLET | Freq: Every day | ORAL | Status: DC
  Administered 2021-02-15 – 2021-02-17 (×3): 500 mg via ORAL
  Filled 2021-02-15 (×3): qty 1

## 2021-02-15 MED ORDER — DM-GUAIFENESIN ER 30-600 MG PO TB12: 1.0000 | ORAL_TABLET | Freq: Two times a day (BID) | ORAL | Status: DC

## 2021-02-15 MED ORDER — ZINC SULFATE 220 (50 ZN) MG PO CAPS
220.0000 mg | ORAL_CAPSULE | Freq: Every day | ORAL | Status: DC
  Administered 2021-02-15 – 2021-02-17 (×3): 220 mg via ORAL
  Filled 2021-02-15 (×3): qty 1

## 2021-02-15 MED ORDER — LACTATED RINGERS IV BOLUS
1000.0000 mL | Freq: Once | INTRAVENOUS | Status: AC
  Administered 2021-02-15: 03:00:00 1000 mL via INTRAVENOUS

## 2021-02-15 MED ORDER — HYDROCODONE-ACETAMINOPHEN 5-325 MG PO TABS
1.0000 | ORAL_TABLET | Freq: Four times a day (QID) | ORAL | Status: DC | PRN
  Administered 2021-02-15 – 2021-02-17 (×6): 1 via ORAL
  Filled 2021-02-15 (×6): qty 1

## 2021-02-15 MED ORDER — BACLOFEN 10 MG PO TABS
10.0000 mg | ORAL_TABLET | Freq: Three times a day (TID) | ORAL | Status: DC
  Administered 2021-02-15 – 2021-02-17 (×7): 10 mg via ORAL
  Filled 2021-02-15 (×7): qty 1

## 2021-02-15 MED ORDER — ENSURE ENLIVE PO LIQD
237.0000 mL | Freq: Two times a day (BID) | ORAL | Status: DC
  Administered 2021-02-15 – 2021-02-17 (×6): 237 mL via ORAL

## 2021-02-15 MED ORDER — CELECOXIB 100 MG PO CAPS
200.0000 mg | ORAL_CAPSULE | Freq: Two times a day (BID) | ORAL | Status: DC
  Administered 2021-02-15 – 2021-02-17 (×5): 200 mg via ORAL
  Filled 2021-02-15 (×5): qty 2

## 2021-02-15 MED ORDER — ONDANSETRON HCL 4 MG/2ML IJ SOLN: 4.0000 mg | Freq: Four times a day (QID) | INTRAMUSCULAR | Status: DC | PRN

## 2021-02-15 MED ORDER — ENOXAPARIN SODIUM 40 MG/0.4ML IJ SOSY
40.0000 mg | PREFILLED_SYRINGE | INTRAMUSCULAR | Status: DC
  Administered 2021-02-15 – 2021-02-17 (×3): 40 mg via SUBCUTANEOUS
  Filled 2021-02-15 (×3): qty 0.4

## 2021-02-15 MED ORDER — PANTOPRAZOLE SODIUM 40 MG IV SOLR
40.0000 mg | Freq: Every day | INTRAVENOUS | Status: DC
  Administered 2021-02-15 – 2021-02-16 (×2): 40 mg via INTRAVENOUS
  Filled 2021-02-15 (×2): qty 40

## 2021-02-15 MED ORDER — SODIUM CHLORIDE 0.9 % IV SOLN: 100.0000 mg | Freq: Every day | INTRAVENOUS | Status: DC

## 2021-02-15 MED ORDER — DIAZEPAM 5 MG PO TABS
5.0000 mg | ORAL_TABLET | Freq: Three times a day (TID) | ORAL | Status: DC | PRN
  Administered 2021-02-15 – 2021-02-17 (×4): 5 mg via ORAL
  Filled 2021-02-15 (×4): qty 1

## 2021-02-15 MED ORDER — SODIUM CHLORIDE 0.9 % IV SOLN: Freq: Once | INTRAVENOUS | Status: AC

## 2021-02-15 MED ORDER — POTASSIUM CHLORIDE CRYS ER 20 MEQ PO TBCR: 40.0000 meq | EXTENDED_RELEASE_TABLET | Freq: Once | ORAL | Status: DC

## 2021-02-15 MED ORDER — HYDROCOD POLST-CPM POLST ER 10-8 MG/5ML PO SUER: 5.0000 mL | Freq: Two times a day (BID) | ORAL | Status: DC | PRN

## 2021-02-15 MED ORDER — GUAIFENESIN-DM 100-10 MG/5ML PO SYRP: 10.0000 mL | ORAL_SOLUTION | ORAL | Status: DC | PRN

## 2021-02-15 MED ORDER — GABAPENTIN 300 MG PO CAPS
600.0000 mg | ORAL_CAPSULE | Freq: Three times a day (TID) | ORAL | Status: DC
  Administered 2021-02-15 – 2021-02-17 (×7): 600 mg via ORAL
  Filled 2021-02-15 (×7): qty 2

## 2021-02-15 MED ORDER — ONDANSETRON HCL 4 MG PO TABS: 4.0000 mg | ORAL_TABLET | Freq: Four times a day (QID) | ORAL | Status: DC | PRN

## 2021-02-15 MED ORDER — ACETAMINOPHEN 325 MG PO TABS
650.0000 mg | ORAL_TABLET | Freq: Four times a day (QID) | ORAL | Status: DC | PRN
  Administered 2021-02-15: 11:00:00 650 mg via ORAL
  Filled 2021-02-15: qty 2

## 2021-02-15 MED ORDER — SODIUM CHLORIDE 0.9 % IV SOLN: 100.0000 mg | INTRAVENOUS | Status: DC

## 2021-02-15 MED ORDER — GUAIFENESIN-DM 100-10 MG/5ML PO SYRP: 5.0000 mL | ORAL_SOLUTION | ORAL | Status: DC | PRN

## 2021-02-15 NOTE — Consult Note (Signed)
WOC Nurse Consult Note: Reason for Consult: Stage 4 pressure injury to sacrum.  Patient receives dressing changes 6xW at [facility]. Admitted due to +Covid test, weakness. Wheelchair bound in community due to [place]. Communication with Dr. Eliseo Squires via [place] enhanced the development of this patient's integumentary POC. Last seen by my associate D. Barbie Haggis in October of this year. Consult performed remotely with the assistance of Dr. Eliseo Squires, review of the EMR and Nursing staff. Wound type:Pressure in patient with FTT; nutritional deficiency present Pressure Injury POA: Yes Measurement: 4cm x 4xm  2.5cm with undermining per Nursing Flow Sheet Wound bed: deeply hued, last photo with deep red wound bed, moist. Drainage (amount, consistency, odor) moderate serous Periwound: intact, closed wound edges (epibole) Dressing procedure/placement/frequency: Patient to be placed on a mattress replacement with low air loss feature, heels floated in Prevalon boots and dressed with silicone foams.   Topical care to Stage 4 pressure injury to sacrum will be to cleanse with NS and pat dry, then use a silver hydrofiber to fill defect to skin level (Aquacel Ag+ Advantage, [place] # [id number]), then top with dry gauze, ABD pad and secure with Medipore tape. Changes will be daily and PRN soiling. Turning and repositioning is in place, but time in the supine position is to be minimized.    Recommend patient follow up with outpatient [facility] of her choosing post discharge. RD consult would be beneficial while in house as supplements are needed to ensure protein goals are met. If you agree, please consult.  Photos in the EMR of this wound are from October 2022. New photographs are recommended for TOC. If you agree, please photograph and upload to Media Tab of EMR.  [place] nursing team will not follow, but will remain available to this patient, the nursing and medical teams.  Please re-consult if needed. Thanks, Maudie Flakes, MSN, RN, [place],  Arther Abbott  Pager# [phone]

## 2021-02-15 NOTE — Progress Notes (Signed)
Patient placed in observation after midnight. Please see H&P.  Currently she feels she is at her baseline and does not want any treatment for COVID.  She is not hypoxic.  TOC consult as [facility] for [facility] care and with a + COVID swab not sure she will be able to do that. Eulogio Bear DO

## 2021-02-15 NOTE — Progress Notes (Signed)
Heel foam dressing applied to both heels, pt c/o heels being sore. No open areas, skin intact, no redness. Foam applied for comfort.

## 2021-02-15 NOTE — H&P (Addendum)
History and Physical  DORIAN RENFRO TIR:[id number] DOB: 06-16-1955 DOA: 02/14/2021  Referring physician: Maudie Flakes, MD PCP: Janifer Adie, MD  Patient coming from: Home  Chief Complaint: Weakness, low blood pressure and hypoxia  HPI: Faith [name] is a 65 y.o. female with medical history significant for COPD, hypertension, multiple sclerosis, multiple sclerosis, wheelchair dependent, pressure ulcer in the right buttock who presents to the emergency department via EMS due to several days onset of weakness and oral intake.  Patient states that she went to [facility] yesterday with her son to the wound center due to her stage IV decubitus pressure ulcer and that she has been so tired and weak due to the trip, so she spent most of yesterday sleeping.  Son activated EMS because he thought that she was "weaker than usual".  On arrival of EMS team, patient was said to be hypotensive with SBP in the 80s and hypoxic with O2 sat of 87% on room air.  Supplemental oxygen therapy was provided,  IV NS 164mls was provided en route.  There was no report of fever, chills, chest pain, shortness of breath.  Patient denies loss of taste or smell, she states that her decreased appetite was related to history of MS.  Patient states that she had second COVID-19 booster on Friday (12/16).  ED Course:  In the emergency department, BP was within hypotensive range at 83/63, other vital signs were within normal range.  Work-up in the ED showed normocytic anemia, hypokalemia, lactic acidosis, lactic acid was 2.1 > 1.3.  Influenza A, B was negative.  SARS coronavirus 2 was positive. Chest x-ray showed no evidence of acute chest disease. IV hydration was provided and remdesivir was started.  Hospitalist was asked to admit patient for further evaluation and management.  Review of Systems: Constitutional: Negative for chills and fever.  HENT: Negative for ear pain and sore throat.   Eyes: Negative for pain and visual  disturbance.  Respiratory: Negative for cough, chest tightness and shortness of breath.   Cardiovascular: Negative for chest pain and palpitations.  Gastrointestinal: Negative for abdominal pain and vomiting.  Endocrine: Negative for polyphagia and polyuria.  Genitourinary: Negative for decreased urine volume, dysuria, enuresis Musculoskeletal: Negative for arthralgias and back pain.  Skin: Negative for color change and rash.  Allergic/Immunologic: Negative for immunocompromised state.  Neurological: Positive for weakness (due to yesterday's trip to [place]).  Negative for tremors, syncope, speech difficulty Hematological: Does not bruise/bleed easily.   A full 10 point Review of Systems was done, except as stated above, all other Review of systems were negative.   Past Medical History:  Diagnosis Date   Anemia    my whole life, Used to take iron   Arthritis    spine, lumbar   COPD (chronic obstructive pulmonary disease) (HCC)    Emphysema of lung ([place])    HTN (hypertension) 03/01/2020   Hyperlipidemia 11/28/2019   Narcolepsy    by history, has nightmares   Neuromuscular disorder ([place])    leg issues from spine   Past Surgical History:  Procedure Laterality Date   ANTERIOR CERVICAL DECOMP/DISCECTOMY FUSION N/A 01/19/2019   Procedure: ANTERIOR CERVICAL DECOMPRESSION/DISCECTOMY Cervical three - four;  Surgeon: Ashok Pall, MD;  Location: [facility];  Service: Neurosurgery;  Laterality: N/A;   EYE SURGERY Right    ORIF FACIAL FRACTURE     POSTERIOR CERVICAL LAMINECTOMY N/A 01/19/2019   Procedure: POSTERIOR CERVICAL LAMINECTOMY Removal of Synovial Cyst and posterior cervical fusion;  Surgeon: Ashok Pall, MD;  Location: [facility];  Service: Neurosurgery;  Laterality: N/A;   TEE WITHOUT CARDIOVERSION N/A 03/17/2020   Procedure: TRANSESOPHAGEAL ECHOCARDIOGRAM (TEE) WITH PROPOFOL;  Surgeon: Satira Sark, MD;  [facility];  Service: Cardiovascular;  Laterality: N/A;   TUBAL  LIGATION     tubaligation      Social History:  reports that she has been smoking cigarettes. She has a 11.25 pack-year smoking history. She has never used smokeless tobacco. She reports current alcohol use of about 1.0 standard drink per week. She reports that she does not use drugs.   Allergies  Allergen Reactions   Darvon [Propoxyphene] Other (See Comments)    Hallicuations   Penicillins Rash    Did it involve swelling of the face/tongue/throat, SOB, or low BP? Unknown Did it involve sudden or severe rash/hives, skin peeling, or any reaction on the inside of your mouth or nose? Unknown Did you need to seek medical attention at a hospital or doctor's office? Unknown When did it last happen?      Unknown If all above answers are NO, may proceed with cephalosporin use.     Family History  Problem Relation Age of Onset   Heart attack Mother    Kidney failure Mother    Diabetes Mother    Heart disease Mother    Hyperlipidemia Mother    Hypertension Mother    Heart attack Father 9   Hypertension Father    Hypertension Sister    Diabetes Sister    Other Brother        house fire   Hypertension Brother    Seizures Brother    Heart disease Brother        mitral valve   Arthritis Daughter        DJD, AS fibromyalgia   Polymyositis Daughter    Cancer Maternal Grandfather        lung   Heart disease Maternal Grandfather    Other Paternal Grandmother        house fire   Alcohol abuse Son      Prior to Admission medications   Medication Sig Start Date End Date Taking? Authorizing Provider  atorvastatin (LIPITOR) 20 MG tablet Take 1 tablet (20 mg total) by mouth daily. Patient not taking: No sig reported 01/30/20   Ailene Ards, NP  baclofen (LIORESAL) 10 MG tablet Take 1 tablet (10 mg total) by mouth 3 (three) times daily. 09/24/19   Lovorn, Jinny Blossom, MD  celecoxib (CELEBREX) 200 MG capsule Take 1 capsule (200 mg total) by mouth 2 (two) times daily. 08/18/20   Doree Albee, MD  Cholecalciferol 50 MCG (2000 UT) TABS Take 2,000 Int'l Units by mouth daily.    [provider]  diazepam (VALIUM) 5 MG tablet TAKE (1) TABLET BY MOUTH EVERY SIX HOURS AS NEEDED FOR MUSCLE SPASMS. 09/29/20   Lovorn, Jinny Blossom, MD  feeding supplement (ENSURE ENLIVE / ENSURE PLUS) LIQD Take 237 mLs by mouth 3 (three) times daily between meals. 12/12/20   Alma Friendly, MD  gabapentin (NEURONTIN) 300 MG capsule Take 2 capsules (600 mg total) by mouth 3 (three) times daily. 02/05/19   Angiulli, Lavon Paganini, PA-C  HYDROcodone-acetaminophen (NORCO/VICODIN) 5-325 MG tablet Take 1 tablet by mouth every 6 (six) hours as needed for moderate pain. 09/11/20   Doree Albee, MD  ibuprofen (ADVIL) 800 MG tablet Take 800 mg by mouth every 8 (eight) hours as needed for fever, headache  or mild pain.    [provider]  metroNIDAZOLE (FLAGYL) 500 MG tablet 500 mg daily. Topical for wound 11/18/20   [provider]  polyethylene glycol (MIRALAX) 17 g packet Take 17 g by mouth daily. 12/25/20   Fredia Sorrow, MD  propranolol (INDERAL) 40 MG tablet Take 1 tablet (40 mg total) by mouth 2 (two) times daily. 08/26/20   Doree Albee, MD    Physical Exam: BP (!) 89/63    Pulse 65    Temp 97.6 F (36.4 C) (Oral)    Resp 15    Ht 5\' 4"  (1.626 m)    Wt 46 kg    SpO2 100%    BMI 17.41 kg/m   General: 65 y.o. year-old female well developed well nourished in no acute distress.  Alert and oriented x3. HEENT: Dry mucous membrane.  NCAT, EOMI Neck: Supple, trachea medial Cardiovascular: Regular rate and rhythm with no rubs or gallops.  No thyromegaly or JVD noted.  No lower extremity edema. 2/4 pulses in all 4 extremities. Respiratory: Clear to auscultation with no wheezes or rales. Good inspiratory effort. Abdomen: Soft, nontender nondistended with normal bowel sounds x4 quadrants. Muskuloskeletal: No cyanosis, clubbing or edema noted bilaterally Lymphatic: No axillary or  supraclavicular lymphadenopathy Neuro: CN II-XII intact, strength 5/5 x 4, sensation, reflexes intact Skin: No ulcerative lesions noted or rashes Psychiatry: Judgement and insight appear normal. Mood is appropriate for condition and setting          Labs on Admission:  Basic Metabolic Panel: Recent Labs  Lab 02/14/21 2218  NA 137  K 3.4*  CL 104  CO2 26  GLUCOSE 126*  BUN 27*  CREATININE 0.88  CALCIUM 7.8*   Liver Function Tests: No results for input(s): AST, ALT, ALKPHOS, BILITOT, PROT, ALBUMIN in the last 168 hours. No results for input(s): LIPASE, AMYLASE in the last 168 hours. No results for input(s): AMMONIA in the last 168 hours. CBC: Recent Labs  Lab 02/14/21 2218  WBC 8.1  HGB 10.7*  HCT 33.5*  MCV 95.7  PLT 188   Cardiac Enzymes: No results for input(s): CKTOTAL, CKMB, CKMBINDEX, TROPONINI in the last 168 hours.  BNP (last 3 results) No results for input(s): BNP in the last 8760 hours.  ProBNP (last 3 results) No results for input(s): PROBNP in the last 8760 hours.  CBG: Recent Labs  Lab 02/14/21 2155  GLUCAP 121*    Radiological Exams on Admission: DG Chest Port 1 View  Result Date: 02/14/2021 CLINICAL DATA:  Weakness, shortness of breath and hypoxia. EXAM: PORTABLE CHEST 1 VIEW COMPARISON:  Portable chest 12/10/2020. FINDINGS: Heart size and central vasculature are normal. There is patchy aortic calcification and slight tortuosity. The sulci are sharp. The lungs are emphysematous but clear. Osteopenia and thoracic spondylosis. Multiple overlying monitor wires. IMPRESSION: No evidence of acute chest disease.  Stable COPD chest. Electronically Signed   By: Telford Nab M.D.   On: 02/14/2021 23:16    EKG: I independently viewed the EKG done and my findings are as followed: Normal sinus rhythm at a rate of 61 bpm  Assessment/Plan Present on Admission:  COVID-19 virus infection  Multiple sclerosis ([place])  Principal Problem:   COVID-19 virus  infection Active Problems:   Multiple sclerosis ([place])   Mixed hyperlipidemia   Essential hypertension   Dehydration   Failure to thrive in adult   Hypokalemia   Lactic acidosis  Generalized weakness Failure to thrive in an adult Patient states  that this was related to a history of MS Protein supplement to be provided Dietitian will be consulted and we shall await further recommendation  COVID-19 virus infection-incidental finding only Patient presents with generalized weakness and decreased oral intake which she states was related to chronic history of MS.  She was reported to have hypoxia by EMS, however patient has maintained normal O2 sat on room air since arrival to the ED, so, steroid will be held at this time Patient states that she just had her second COVID 19 booster on Friday-12/16 Continue vitamin-C 500 mg p.o. Daily Continue zinc 220 mg p.o. Daily Continue Tylenol p.r.n. for fever Continue airborne isolation precaution Continue monitoring daily inflammatory markers  Dehydration Continue IV hydration  Lactic acidosis-resolved Lactic acid 2.1 > 1.3  Hypokalemia K+ 3.4; this will be replenished  MS Continue Celebrex,  gabapentin and as needed Vicodin   Hyperlipidemia Patient was not taking statin per med rec, we shall await updated med rec   Hypertension This will be held at this time due to soft BP  Goals of care Palliative care will be consulted   DVT prophylaxis: Lovenox  Code Status: Full code  Family Communication: None at bedside  Disposition Plan:  Patient is from:                        home Anticipated DC to:                   SNF or family members home Anticipated DC date:               2-3 days Anticipated DC barriers:          Patient requires inpatient management due to generalized weakness and failure to thrive in an adult and pending dietitian consult  Consults called: Dietitian  Admission status: Observation    Bernadette Hoit  MD [facility]  02/15/2021, 2:01 AM

## 2021-02-16 DIAGNOSIS — U071 COVID-19: Secondary | ICD-10-CM | POA: Diagnosis not present

## 2021-02-16 LAB — CBC WITH DIFFERENTIAL/PLATELET
Abs Immature Granulocytes: 0.02 10*3/uL (ref 0.00–0.07)
Basophils Absolute: 0 10*3/uL (ref 0.0–0.1)
Basophils Relative: 0 %
Eosinophils Absolute: 0 10*3/uL (ref 0.0–0.5)
Eosinophils Relative: 0 %
HCT: 33.1 % — ABNORMAL LOW (ref 36.0–46.0)
Hemoglobin: 10.6 g/dL — ABNORMAL LOW (ref 12.0–15.0)
Immature Granulocytes: 0 %
Lymphocytes Relative: 27 %
Lymphs Abs: 1.7 10*3/uL (ref 0.7–4.0)
MCH: 29.5 pg (ref 26.0–34.0)
MCHC: 32 g/dL (ref 30.0–36.0)
MCV: 92.2 fL (ref 80.0–100.0)
Monocytes Absolute: 0.6 10*3/uL (ref 0.1–1.0)
Monocytes Relative: 10 %
Neutro Abs: 3.9 10*3/uL (ref 1.7–7.7)
Neutrophils Relative %: 63 %
Platelets: 196 10*3/uL (ref 150–400)
RBC: 3.59 MIL/uL — ABNORMAL LOW (ref 3.87–5.11)
RDW: 14.6 % (ref 11.5–15.5)
WBC: 6.2 10*3/uL (ref 4.0–10.5)
nRBC: 0.3 % — ABNORMAL HIGH (ref 0.0–0.2)

## 2021-02-16 LAB — BASIC METABOLIC PANEL
Anion gap: 7 (ref 5–15)
BUN: 12 mg/dL (ref 8–23)
CO2: 24 mmol/L (ref 22–32)
Calcium: 7.8 mg/dL — ABNORMAL LOW (ref 8.9–10.3)
Chloride: 109 mmol/L (ref 98–111)
Creatinine, Ser: 0.45 mg/dL (ref 0.44–1.00)
GFR, Estimated: >60 mL/min (ref >60)
Glucose, Bld: 76 mg/dL (ref 70–99)
Potassium: 3.4 mmol/L — ABNORMAL LOW (ref 3.5–5.1)
Sodium: 140 mmol/L (ref 135–145)

## 2021-02-16 LAB — C-REACTIVE PROTEIN: CRP: 0.8 mg/dL (ref <1.0)

## 2021-02-16 LAB — PHOSPHORUS: Phosphorus: 2.9 mg/dL (ref 2.5–4.6)

## 2021-02-16 LAB — D-DIMER, QUANTITATIVE: D-Dimer, Quant: 0.76 ug/mL-FEU — ABNORMAL HIGH (ref 0.00–0.50)

## 2021-02-16 LAB — MAGNESIUM: Magnesium: 1.6 mg/dL — ABNORMAL LOW (ref 1.7–2.4)

## 2021-02-16 LAB — FERRITIN: Ferritin: 39 ng/mL (ref 11–307)

## 2021-02-16 MED ORDER — MAGNESIUM SULFATE 2 GM/50ML IV SOLN
2.0000 g | Freq: Once | INTRAVENOUS | Status: AC
  Administered 2021-02-16: 18:00:00 2 g via INTRAVENOUS
  Filled 2021-02-16: qty 50

## 2021-02-16 MED ORDER — POTASSIUM CHLORIDE CRYS ER 10 MEQ PO TBCR
40.0000 meq | EXTENDED_RELEASE_TABLET | Freq: Once | ORAL | Status: AC
  Administered 2021-02-16: 15:00:00 40 meq via ORAL
  Filled 2021-02-16: qty 4

## 2021-02-16 MED ORDER — PROPRANOLOL HCL 20 MG PO TABS
20.0000 mg | ORAL_TABLET | Freq: Two times a day (BID) | ORAL | Status: DC
  Administered 2021-02-16 – 2021-02-17 (×3): 20 mg via ORAL
  Filled 2021-02-16 (×3): qty 1

## 2021-02-16 NOTE — Progress Notes (Signed)
Progress Note    [name]  LOV:[id number] DOB: Jul 23, 1955  DOA: 02/14/2021 PCP: Janifer Adie, MD    Brief Narrative:    Medical records reviewed and are as summarized below:  Faith [name] is an 65 y.o. female with medical history significant for COPD, hypertension, multiple sclerosis, multiple sclerosis, wheelchair dependent, pressure ulcer in the right buttock who presents to the emergency department via EMS due to several days onset of weakness and oral intake.  Incidentally found to be COVID positive.  Not symptomatic and patient currently feels like normal self.     Assessment/Plan:   Principal Problem:   COVID-19 virus infection Active Problems:   Multiple sclerosis (HCC)   Mixed hyperlipidemia   Essential hypertension   Dehydration   Failure to thrive in adult   Hypokalemia   Lactic acidosis   Generalized weakness   Pressure injury of skin   Generalized weakness -patient says she is back to her baseline   COVID-19 virus infection-incidental finding only -does not want treatment, says she is not symptomatic Patient states that she just had her second COVID 19 booster on Friday-12/16 Continue vitamin-C 500 mg p.o. Daily Continue zinc 220 mg p.o. Daily Continue Tylenol p.r.n. for fever Continue airborne isolation precaution  Hypomagnesemia -IV Mg   Dehydration -resolved  Lactic acidosis -resolved  Hypokalemia -appears to have never gotten the ordered Kdur yesterday   MS Continue Celebrex,  gabapentin and as needed Vicodin   Hypertension -resume home meds at a lower dose as BP improved    Family Communication/Anticipated D/C date and plan/Code Status   DVT prophylaxis: Lovenox ordered. Code Status: Full Code.  Disposition Plan: Status is: Observation  The patient will require care spanning > 2 midnights and should be moved to inpatient because: will need SNF for respite care       Medical Consultants:    None.    Subjective:   Feels like he at his baseline  Objective:    Vitals:   02/15/21 0427 02/15/21 1453 02/15/21 2242 02/16/21 0437  BP: 101/69 100/65 132/79 134/84  Pulse: 65 68 72 82  Resp: 17 18 18 18   Temp: 97.6 F (36.4 C) 98.3 F (36.8 C) 98.6 F (37 C) 98.1 F (36.7 C)  TempSrc: Oral Oral    SpO2: 99% 95% 98% 95%  Weight:      Height:        Intake/Output Summary (Last 24 hours) at 02/16/2021 1313 Last data filed at 02/16/2021 0500 Gross per 24 hour  Intake 927.43 ml  Output 900 ml  Net 27.43 ml   Filed Weights   02/14/21 2135 02/15/21 0400  Weight: 46 kg 46.3 kg    Exam:  General: Appearance:    Frail female in no acute distress, slightly flushed appearing     Lungs:     respirations unlabored, not on O2  Heart:    Normal heart rate.       Neurologic:   Awake, alert, pleasant and cooperative     Data Reviewed:   I have personally reviewed following labs and imaging studies:  Labs: Labs show the following:   Basic Metabolic Panel: Recent Labs  Lab 02/14/21 2218 02/15/21 0540 02/16/21 0506  NA 137  --  140  K 3.4*  --  3.4*  CL 104  --  109  CO2 26  --  24  GLUCOSE 126*  --  76  BUN 27*  --  12  CREATININE 0.88  --  0.45  CALCIUM 7.8*  --  7.8*  MG  --  1.7 1.6*  PHOS  --  2.8 2.9   GFR Estimated Creatinine Clearance: 51.9 mL/min (by C-G formula based on SCr of 0.45 mg/dL). Liver Function Tests: No results for input(s): AST, ALT, ALKPHOS, BILITOT, PROT, ALBUMIN in the last 168 hours. No results for input(s): LIPASE, AMYLASE in the last 168 hours. No results for input(s): AMMONIA in the last 168 hours. Coagulation profile No results for input(s): INR, PROTIME in the last 168 hours.  CBC: Recent Labs  Lab 02/14/21 2218 02/15/21 0540 02/16/21 0506  WBC 8.1 5.7 6.2  NEUTROABS  --  3.2 3.9  HGB 10.7* 10.0* 10.6*  HCT 33.5* 31.2* 33.1*  MCV 95.7 93.4 92.2  PLT 188 181 196   Cardiac Enzymes: No results for input(s):  CKTOTAL, CKMB, CKMBINDEX, TROPONINI in the last 168 hours. BNP (last 3 results) No results for input(s): PROBNP in the last 8760 hours. CBG: Recent Labs  Lab 02/14/21 2155  GLUCAP 121*   D-Dimer: Recent Labs    02/15/21 0540 02/16/21 0506  DDIMER 0.73* 0.76*   Hgb A1c: No results for input(s): HGBA1C in the last 72 hours. Lipid Profile: No results for input(s): CHOL, HDL, LDLCALC, TRIG, CHOLHDL, LDLDIRECT in the last 72 hours. Thyroid function studies: No results for input(s): TSH, T4TOTAL, T3FREE, THYROIDAB in the last 72 hours.  Invalid input(s): FREET3 Anemia work up: Recent Labs    02/15/21 0540 02/16/21 0507  FERRITIN 45 39   Sepsis Labs: Recent Labs  Lab 02/14/21 2218 02/15/21 0029 02/15/21 0540 02/16/21 0506  WBC 8.1  --  5.7 6.2  LATICACIDVEN 2.1* 1.3  --   --     Microbiology Recent Results (from the past 240 hour(s))  Culture, blood (routine x 2)     Status: None (Preliminary result)   Collection Time: 02/14/21 10:19 PM   Specimen: Right Antecubital; Blood  Result Value Ref Range Status   Specimen Description   Final    RIGHT ANTECUBITAL BOTTLES DRAWN AEROBIC AND ANAEROBIC   Special Requests Blood Culture adequate volume  Final   Culture   Final    NO GROWTH 2 DAYS Performed at [facility], [address], [place], [place] [address]    Report Status PENDING  Incomplete  Resp Panel by RT-PCR (Flu A&B, Covid) Nasopharyngeal Swab     Status: Abnormal   Collection Time: 02/14/21 10:21 PM   Specimen: Nasopharyngeal Swab; Nasopharyngeal(NP) swabs in vial transport medium  Result Value Ref Range Status   SARS Coronavirus 2 by RT PCR POSITIVE (A) NEGATIVE Final    Comment: (NOTE) SARS-CoV-2 target nucleic acids are DETECTED.  The SARS-CoV-2 RNA is generally detectable in upper respiratory specimens during the acute phase of infection. Positive results are indicative of the presence of the identified virus, but do not rule out bacterial infection or  co-infection with other pathogens not detected by the test. Clinical correlation with patient history and other diagnostic information is necessary to determine patient infection status. The expected result is Negative.  Fact Sheet for Patients: EntrepreneurPulse.com.au  Fact Sheet for Healthcare Providers: IncredibleEmployment.be  This test is not yet approved or cleared by the [place] FDA and  has been authorized for detection and/or diagnosis of SARS-CoV-2 by FDA under an Emergency Use Authorization (EUA).  This EUA will remain in effect (meaning this test can be used) for the duration of  the COVID-19 declaration under  Section 564(b)(1) of the A ct, 21 U.S.C. section 360bbb-3(b)(1), unless the authorization is terminated or revoked sooner.     Influenza A by PCR NEGATIVE NEGATIVE Final   Influenza B by PCR NEGATIVE NEGATIVE Final    Comment: (NOTE) The Xpert Xpress SARS-CoV-2/FLU/RSV plus assay is intended as an aid in the diagnosis of influenza from Nasopharyngeal swab specimens and should not be used as a sole basis for treatment. Nasal washings and aspirates are unacceptable for Xpert Xpress SARS-CoV-2/FLU/RSV testing.  Fact Sheet for Patients: EntrepreneurPulse.com.au  Fact Sheet for Healthcare Providers: IncredibleEmployment.be  This test is not yet approved or cleared by the [place] FDA and has been authorized for detection and/or diagnosis of SARS-CoV-2 by FDA under an Emergency Use Authorization (EUA). This EUA will remain in effect (meaning this test can be used) for the duration of the COVID-19 declaration under Section 564(b)(1) of the Act, 21 U.S.C. section 360bbb-3(b)(1), unless the authorization is terminated or revoked.  Performed at [facility], [address], [place], [place] [address]   Culture, blood (routine x 2)     Status: None (Preliminary result)   Collection  Time: 02/14/21 10:24 PM   Specimen: Right Antecubital; Blood  Result Value Ref Range Status   Specimen Description   Final    RIGHT ANTECUBITAL BOTTLES DRAWN AEROBIC AND ANAEROBIC   Special Requests Blood Culture adequate volume  Final   Culture   Final    NO GROWTH 2 DAYS Performed at [facility], [address], [place], [place] [address]    Report Status PENDING  Incomplete    Procedures and diagnostic studies:  DG Chest Port 1 View  Result Date: 02/14/2021 CLINICAL DATA:  Weakness, shortness of breath and hypoxia. EXAM: PORTABLE CHEST 1 VIEW COMPARISON:  Portable chest 12/10/2020. FINDINGS: Heart size and central vasculature are normal. There is patchy aortic calcification and slight tortuosity. The sulci are sharp. The lungs are emphysematous but clear. Osteopenia and thoracic spondylosis. Multiple overlying monitor wires. IMPRESSION: No evidence of acute chest disease.  Stable COPD chest. Electronically Signed   By: Telford Nab M.D.   On: 02/14/2021 23:16    Medications:    vitamin C  500 mg Oral Daily   baclofen  10 mg Oral TID   celecoxib  200 mg Oral BID   enoxaparin (LOVENOX) injection  40 mg Subcutaneous Q24H   feeding supplement  237 mL Oral BID BM   gabapentin  600 mg Oral TID   pantoprazole (PROTONIX) IV  40 mg Intravenous QHS   potassium chloride  40 mEq Oral Once   zinc sulfate  220 mg Oral Daily   Continuous Infusions:   LOS: 1 day   Geradine Girt  [facility]   How to contact the [facility] Attending or Consulting provider [place] or covering provider during after hours [place], for this patient?  Check the care team in [facility] and look for a) attending/consulting [facility] provider listed and b) the [facility] team listed Log into [email or web address] and use [place]'s universal password to access. If you do not have the password, please contact the hospital operator. Locate the [facility] provider you are looking for under [facility] and page to a number that you can be  directly reached. If you still have difficulty reaching the provider, please page the [facility] (Director on Call) for the Hospitalists listed on amion for assistance.  02/16/2021, 1:13 PM

## 2021-02-16 NOTE — TOC Initial Note (Signed)
Transition of Care [facility]) - Initial/Assessment Note    Patient Details  Name: Faith [name] MRN: [id number] Date of Birth: 1956/01/21  Transition of Care [facility]) CM/SW Contact:    Shade Flood, LCSW Phone Number: 02/16/2021, 11:34 AM  Clinical Narrative:                  Pt admitted from home. She is a [employer] [place]. Pt normally goes to a [facility] 6 days a week for sacral dressing packing/change. Spoke with pt's [facility] Social Worker, Estill Bamberg, today to update on pt testing positive for Covid and asking if she can still go to the clinic for the dressing changes. Estill Bamberg called pt's PCP and was informed that pt should go to SNF for a week. Estill Bamberg states that the [employer] are [employer] [employer], [place]. Unsure if these facilities are accepting covid positive patients.   Attempted to reach pt by hospital room phone and her cell phone and patient doesn't answer either one. Spoke with pt's daughter by phone to update on above. Daughter agreeable to referrals stating that family cannot do the dressing changes at home.  Will refer out and follow with hopeful dc for later today.  Expected Discharge Plan: Skilled Nursing Facility Barriers to Discharge: SNF Pending bed offer   Patient Goals and CMS Choice Patient states their goals for this hospitalization and ongoing recovery are:: get better CMS Medicare.gov Compare Post Acute Care list provided to:: Patient Represenative (must comment) Choice offered to / list presented to : Adult Children  Expected Discharge Plan and Services Expected Discharge Plan: [place] In-house Referral: Clinical Social Work   Post Acute Care Choice: [place] Living arrangements for the past 2 months: [place]                                      Prior Living Arrangements/Services Living arrangements for the past 2 months: Single Family Home Lives with:: Adult  Children Patient language and need for interpreter reviewed:: Yes Do you feel safe going back to the place where you live?: Yes      Need for Family Participation in Patient Care: Yes (Comment) Care giver support system in place?: Yes (comment) Current home services: DME Criminal Activity/Legal Involvement Pertinent to Current Situation/Hospitalization: No - Comment as needed  Activities of Daily Living Home Assistive Devices/Equipment: Wheelchair, Hospital bed ADL Screening (condition at time of admission) Patient's cognitive ability adequate to safely complete daily activities?: Yes Is the patient deaf or have difficulty hearing?: No Does the patient have difficulty seeing, even when wearing glasses/contacts?: No Does the patient have difficulty concentrating, remembering, or making decisions?: No Patient able to express need for assistance with ADLs?: Yes Does the patient have difficulty dressing or bathing?: Yes Independently performs ADLs?: No Communication: Independent Dressing (OT): Dependent Is this a change from baseline?: Pre-admission baseline Grooming: Dependent Is this a change from baseline?: Pre-admission baseline Feeding: Needs assistance Is this a change from baseline?: Pre-admission baseline Bathing: Dependent Is this a change from baseline?: Pre-admission baseline Toileting: Dependent Is this a change from baseline?: Pre-admission baseline In/Out Bed: Dependent Is this a change from baseline?: Pre-admission baseline Walks in Home: Dependent Is this a change from baseline?: Pre-admission baseline Does the patient have difficulty walking or climbing stairs?: Yes Weakness of Legs: Both Weakness of Arms/Hands: Both  Permission  Sought/Granted Permission sought to share information with : Facility Art therapist granted to share information with : Yes, Verbal Permission Granted     Permission granted to share info w AGENCY: snfs         Emotional Assessment       Orientation: : Oriented to Self, Oriented to Place, Oriented to  Time, Oriented to Situation Alcohol / Substance Use: Not Applicable Psych Involvement: No (comment)  Admission diagnosis:  Dehydration [E86.0] Weakness [R53.1] Generalized weakness [R53.1] Hypotension, unspecified hypotension type [I95.9] COVID-19 virus infection [U07.1] COVID-19 [U07.1] Patient Active Problem List   Diagnosis Date Noted   COVID-19 virus infection 02/15/2021   Dehydration 02/15/2021   Failure to thrive in adult 02/15/2021   Hypokalemia 02/15/2021   Lactic acidosis 02/15/2021   Generalized weakness 02/15/2021   Pressure injury of skin 02/15/2021   Acute osteomyelitis ([place]) 12/10/2020   Malnutrition of moderate degree 03/29/2020   Bacteremia    Sacral decubitus ulcer, stage IV ([place])    Acute appendicitis    Decubital ulcer 03/13/2020   SIRS (systemic inflammatory response syndrome) ([place]) 03/13/2020   Bilateral leg weakness 03/01/2020   COPD (chronic obstructive pulmonary disease) ([place]) 03/01/2020   Essential hypertension 03/01/2020   Pressure injury of right buttock, stage 2 ([place]) 02/08/2020   PVD (peripheral vascular disease) ([place]) 11/28/2019   Mixed hyperlipidemia 11/28/2019   Wheelchair dependence 09/24/2019   Cervical myelopathy ([place]) 01/22/2019   Spinal cord compression due to degenerative disorder of spinal column 01/20/2019   Synovial cyst    Spondylolisthesis, cervical region 01/19/2019   Chronic back pain 01/18/2019   Multiple sclerosis exacerbation ([place]) 01/17/2019   Erythrocytosis 09/13/2018   Vitamin D deficiency 03/09/2016   Multiple sclerosis ([place]) 02/24/2016   Tobacco use 12/11/2015   Aortic atherosclerosis ([place]) 12/11/2015   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 [address] Phone: [phone] Fax: [phone]     Social Determinants of Health (SDOH) Interventions    Readmission Risk Interventions Readmission Risk Prevention Plan 02/16/2021 12/12/2020 03/31/2020  Post Dischage Appt - Complete Complete  Medication Screening - Complete Complete  Transportation Screening Complete Complete Complete  Home Care Screening Complete - -  Medication Review (RN CM) Complete - -  Some recent data might be hidden

## 2021-02-16 NOTE — NC FL2 (Signed)
[place] LEVEL OF CARE SCREENING TOOL     IDENTIFICATION  Patient Name: Faith [name] Birthdate: 1955/09/08 Sex: female Admission Date (Current Location): 02/14/2021  [facility] and [place] Number:  [employer] and Address:  [facility] [address] [place]      Provider Number: [id number]  Attending Physician Name and Address:  Geradine Girt, DO  Relative Name and Phone Number:       Current Level of Care: Hospital Recommended Level of Care: [place] Prior Approval Number:    Date Approved/Denied:   PASRR Number: [id number]  Discharge Plan: SNF    Current Diagnoses: Patient Active Problem List   Diagnosis Date Noted   COVID-19 virus infection 02/15/2021   Dehydration 02/15/2021   Failure to thrive in adult 02/15/2021   Hypokalemia 02/15/2021   Lactic acidosis 02/15/2021   Generalized weakness 02/15/2021   Pressure injury of skin 02/15/2021   Acute osteomyelitis ([place]) 12/10/2020   Malnutrition of moderate degree 03/29/2020   Bacteremia    Sacral decubitus ulcer, stage IV ([place])    Acute appendicitis    Decubital ulcer 03/13/2020   SIRS (systemic inflammatory response syndrome) ([place]) 03/13/2020   Bilateral leg weakness 03/01/2020   COPD (chronic obstructive pulmonary disease) ([place]) 03/01/2020   Essential hypertension 03/01/2020   Pressure injury of right buttock, stage 2 ([place]) 02/08/2020   PVD (peripheral vascular disease) ([place]) 11/28/2019   Mixed hyperlipidemia 11/28/2019   Wheelchair dependence 09/24/2019   Cervical myelopathy ([place]) 01/22/2019   Spinal cord compression due to degenerative disorder of spinal column 01/20/2019   Synovial cyst    Spondylolisthesis, cervical region 01/19/2019   Chronic back pain 01/18/2019   Multiple sclerosis exacerbation ([place]) 01/17/2019   Erythrocytosis 09/13/2018   Vitamin D deficiency 03/09/2016   Multiple sclerosis ([place]) 02/24/2016   Tobacco use  12/11/2015   Aortic atherosclerosis ([place]) 12/11/2015   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   Falls frequently 12/11/2015   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   Other emphysema ([place]) 12/11/2015    Orientation RESPIRATION BLADDER Height & Weight     Self, Time, Situation, Place  Normal Continent Weight: 102 lb 1.2 oz (46.3 kg) Height:  5\' 4"  (162.6 cm)  BEHAVIORAL SYMPTOMS/MOOD NEUROLOGICAL BOWEL NUTRITION STATUS      Continent Diet (see dc summary)  AMBULATORY STATUS COMMUNICATION OF NEEDS Skin   Limited Assist Verbally PU Stage and Appropriate Care (medial sacrum)       PU Stage 4 Dressing: Daily               Personal Care Assistance Level of Assistance  Bathing, Feeding, Dressing Bathing Assistance: Limited assistance Feeding assistance: Independent Dressing Assistance: Limited assistance     Functional Limitations Info  Sight, Hearing, Speech Sight Info: Adequate Hearing Info: Adequate Speech Info: Adequate    SPECIAL CARE FACTORS FREQUENCY                       Contractures Contractures Info: Not present    Additional Factors Info  Code Status, Allergies Code Status Info: Full Allergies Info: Darvon, Penicillins           Current Medications (02/16/2021):  This is the current hospital active medication list Current Facility-Administered Medications  Medication Dose Route Frequency Provider Last Rate Last Admin   acetaminophen (TYLENOL) tablet 650 mg  650 mg Oral Q6H PRN Adefeso, Oladapo, DO  650 mg at 02/15/21 1052   ascorbic acid (VITAMIN C) tablet 500 mg  500 mg Oral Daily Adefeso, Oladapo, DO   500 mg at 02/16/21 0852   baclofen (LIORESAL) tablet 10 mg  10 mg Oral TID Eulogio Bear U, DO   10 mg at 02/16/21 1594   celecoxib (CELEBREX) capsule 200 mg  200 mg Oral BID Adefeso, Oladapo, DO   200 mg at 02/16/21 0852   diazepam (VALIUM) tablet 5 mg  5 mg Oral Q8H PRN Eulogio Bear U, DO   5 mg at 02/16/21  0628   enoxaparin (LOVENOX) injection 40 mg  40 mg Subcutaneous Q24H Adefeso, Oladapo, DO   40 mg at 02/16/21 5859   feeding supplement (ENSURE ENLIVE / ENSURE PLUS) liquid 237 mL  237 mL Oral BID BM Adefeso, Oladapo, DO   237 mL at 02/16/21 0853   gabapentin (NEURONTIN) capsule 600 mg  600 mg Oral TID Adefeso, Oladapo, DO   600 mg at 02/16/21 0852   guaiFENesin-dextromethorphan (ROBITUSSIN DM) 100-10 MG/5ML syrup 5 mL  5 mL Oral Q4H PRN Adefeso, Oladapo, DO       HYDROcodone-acetaminophen (NORCO/VICODIN) 5-325 MG per tablet 1 tablet  1 tablet Oral Q6H PRN Adefeso, Oladapo, DO   1 tablet at 02/16/21 0628   ondansetron (ZOFRAN) tablet 4 mg  4 mg Oral Q6H PRN Adefeso, Oladapo, DO       Or   ondansetron (ZOFRAN) injection 4 mg  4 mg Intravenous Q6H PRN Adefeso, Oladapo, DO       pantoprazole (PROTONIX) injection 40 mg  40 mg Intravenous QHS Adefeso, Oladapo, DO   40 mg at 02/15/21 2251   potassium chloride SA (KLOR-CON M) CR tablet 40 mEq  40 mEq Oral Once Adefeso, Oladapo, DO       zinc sulfate capsule 220 mg  220 mg Oral Daily Adefeso, Oladapo, DO   220 mg at 02/16/21 2924     Discharge Medications: Please see discharge summary for a list of discharge medications.  Relevant Imaging Results:  Relevant Lab Results:   Additional Information SSN: [place] [address] [place]

## 2021-02-16 NOTE — Progress Notes (Signed)
Spoke with daughter this morning. She states that her brother visited patient yesterday. Yesterday evening he reported he was not feeling well and possibly running a fever. They would like [place] to be aware and on the lookout for any symptoms like this from the patient.

## 2021-02-17 DIAGNOSIS — U071 COVID-19: Secondary | ICD-10-CM | POA: Diagnosis not present

## 2021-02-17 LAB — CBC WITH DIFFERENTIAL/PLATELET
Abs Immature Granulocytes: 0.02 10*3/uL (ref 0.00–0.07)
Basophils Absolute: 0 10*3/uL (ref 0.0–0.1)
Basophils Relative: 0 %
Eosinophils Absolute: 0 10*3/uL (ref 0.0–0.5)
Eosinophils Relative: 0 %
HCT: 31.2 % — ABNORMAL LOW (ref 36.0–46.0)
Hemoglobin: 10 g/dL — ABNORMAL LOW (ref 12.0–15.0)
Immature Granulocytes: 0 %
Lymphocytes Relative: 31 %
Lymphs Abs: 1.8 10*3/uL (ref 0.7–4.0)
MCH: 30.1 pg (ref 26.0–34.0)
MCHC: 32.1 g/dL (ref 30.0–36.0)
MCV: 94 fL (ref 80.0–100.0)
Monocytes Absolute: 0.7 10*3/uL (ref 0.1–1.0)
Monocytes Relative: 11 %
Neutro Abs: 3.5 10*3/uL (ref 1.7–7.7)
Neutrophils Relative %: 58 %
Platelets: 176 10*3/uL (ref 150–400)
RBC: 3.32 MIL/uL — ABNORMAL LOW (ref 3.87–5.11)
RDW: 14.9 % (ref 11.5–15.5)
WBC: 6 10*3/uL (ref 4.0–10.5)
nRBC: 0 % (ref 0.0–0.2)

## 2021-02-17 LAB — MAGNESIUM: Magnesium: 2 mg/dL (ref 1.7–2.4)

## 2021-02-17 LAB — PHOSPHORUS: Phosphorus: 3.2 mg/dL (ref 2.5–4.6)

## 2021-02-17 MED ORDER — ASCORBIC ACID 500 MG PO TABS: 500.0000 mg | ORAL_TABLET | Freq: Every day | ORAL | Status: AC

## 2021-02-17 MED ORDER — BACLOFEN 10 MG PO TABS: 10.0000 mg | ORAL_TABLET | Freq: Three times a day (TID) | ORAL | 0 refills | Status: DC

## 2021-02-17 MED ORDER — HYDROCODONE-ACETAMINOPHEN 5-325 MG PO TABS: 1.0000 | ORAL_TABLET | Freq: Four times a day (QID) | ORAL | 0 refills | Status: DC | PRN

## 2021-02-17 MED ORDER — DIAZEPAM 5 MG PO TABS: 5.0000 mg | ORAL_TABLET | ORAL | 0 refills | Status: AC

## 2021-02-17 MED ORDER — ZINC SULFATE 220 (50 ZN) MG PO CAPS: 220.0000 mg | ORAL_CAPSULE | Freq: Every day | ORAL | Status: DC

## 2021-02-17 MED ORDER — PROPRANOLOL HCL 40 MG PO TABS: 20.0000 mg | ORAL_TABLET | Freq: Two times a day (BID) | ORAL | 3 refills | Status: DC

## 2021-02-17 MED ORDER — PANTOPRAZOLE SODIUM 40 MG PO TBEC: 40.0000 mg | DELAYED_RELEASE_TABLET | Freq: Every day | ORAL | 0 refills | Status: DC

## 2021-02-17 NOTE — Progress Notes (Signed)
Patient discharged to [facility], transported by [facility] transport. Discharge paperwork given to [facility] transport to give to facility. Belongings sent with patient. Patient transported in there own wheelchair to SNF.

## 2021-02-17 NOTE — Progress Notes (Signed)
Progress Note    Faith [name]  TSV:[id number] DOB: 07-18-1955  DOA: 02/14/2021 PCP: Janifer Adie, MD    Brief Narrative:    Medical records reviewed and are as summarized below:  Faith [name] is an 65 y.o. female with medical history significant for COPD, hypertension, multiple sclerosis, multiple sclerosis, wheelchair dependent, pressure ulcer in the right buttock who presents to the emergency department via EMS due to several days onset of weakness and oral intake.  Incidentally found to be COVID positive.  Not symptomatic and patient currently feels like normal self.     Assessment/Plan:   Principal Problem:   COVID-19 virus infection Active Problems:   Multiple sclerosis (HCC)   Mixed hyperlipidemia   Essential hypertension   Dehydration   Failure to thrive in adult   Hypokalemia   Lactic acidosis   Generalized weakness   Pressure injury of skin   Generalized weakness -patient says she is back to her baseline PT eval while patient is in the hospital   COVID-19 virus infection-incidental finding only -does not want treatment, says she is not symptomatic Patient states that she just had her second COVID 19 booster on Friday-12/16 Continue vitamin-C 500 mg p.o. Daily Continue zinc 220 mg p.o. Daily Continue Tylenol p.r.n. for fever Continue airborne isolation precaution  Hypomagnesemia -IV Mg   Dehydration -resolved  Lactic acidosis -resolved  Hypokalemia -replete and recheck in AM   MS Continue Celebrex,  gabapentin and as needed Vicodin   Hypertension -resume home meds at a lower dose as BP improved   Will need short stay at SNF until [facility] able to resume her wound care   Family Communication/Anticipated D/C date and plan/Code Status   DVT prophylaxis: Lovenox ordered. Code Status: Full Code.  Disposition Plan: Status is: Observation  The patient will require care spanning > 2 midnights and should be moved to inpatient because: will  need SNF for respite care       Medical Consultants:   None.    Subjective:   No overnight events, no fever, no cough, no SOB  Objective:    Vitals:   02/16/21 1446 02/16/21 2132 02/17/21 0535 02/17/21 0844  BP: 127/79 132/79 127/81 126/85  Pulse: 89 75 65 69  Resp:  18 18   Temp:  98 F (36.7 C) 98.2 F (36.8 C)   TempSrc:      SpO2: 95% 100% 96%   Weight:      Height:        Intake/Output Summary (Last 24 hours) at 02/17/2021 1019 Last data filed at 02/17/2021 0534 Gross per 24 hour  Intake 240.54 ml  Output 1000 ml  Net -759.46 ml   Filed Weights   02/14/21 2135 02/15/21 0400  Weight: 46 kg 46.3 kg    Exam:  General: Appearance:    Thin female in no acute distress     Lungs:     Poor effort, diminished, respirations unlabored  Heart:    Normal heart rate.    MS:   All extremities are intact.    Neurologic:   Awake, alert, pleasant and cooperative       Data Reviewed:   I have personally reviewed following labs and imaging studies:  Labs: Labs show the following:   Basic Metabolic Panel: Recent Labs  Lab 02/14/21 2218 02/15/21 0540 02/16/21 0506 02/17/21 0641  NA 137  --  140  --   K 3.4*  --  3.4*  --  CL 104  --  109  --   CO2 26  --  24  --   GLUCOSE 126*  --  76  --   BUN 27*  --  12  --   CREATININE 0.88  --  0.45  --   CALCIUM 7.8*  --  7.8*  --   MG  --  1.7 1.6* 2.0  PHOS  --  2.8 2.9 3.2   GFR Estimated Creatinine Clearance: 51.9 mL/min (by C-G formula based on SCr of 0.45 mg/dL). Liver Function Tests: No results for input(s): AST, ALT, ALKPHOS, BILITOT, PROT, ALBUMIN in the last 168 hours. No results for input(s): LIPASE, AMYLASE in the last 168 hours. No results for input(s): AMMONIA in the last 168 hours. Coagulation profile No results for input(s): INR, PROTIME in the last 168 hours.  CBC: Recent Labs  Lab 02/14/21 2218 02/15/21 0540 02/16/21 0506 02/17/21 0641  WBC 8.1 5.7 6.2 6.0  NEUTROABS  --  3.2  3.9 3.5  HGB 10.7* 10.0* 10.6* 10.0*  HCT 33.5* 31.2* 33.1* 31.2*  MCV 95.7 93.4 92.2 94.0  PLT 188 181 196 176   Cardiac Enzymes: No results for input(s): CKTOTAL, CKMB, CKMBINDEX, TROPONINI in the last 168 hours. BNP (last 3 results) No results for input(s): PROBNP in the last 8760 hours. CBG: Recent Labs  Lab 02/14/21 2155  GLUCAP 121*   D-Dimer: Recent Labs    02/15/21 0540 02/16/21 0506  DDIMER 0.73* 0.76*   Hgb A1c: No results for input(s): HGBA1C in the last 72 hours. Lipid Profile: No results for input(s): CHOL, HDL, LDLCALC, TRIG, CHOLHDL, LDLDIRECT in the last 72 hours. Thyroid function studies: No results for input(s): TSH, T4TOTAL, T3FREE, THYROIDAB in the last 72 hours.  Invalid input(s): FREET3 Anemia work up: Recent Labs    02/15/21 0540 02/16/21 0507  FERRITIN 45 39   Sepsis Labs: Recent Labs  Lab 02/14/21 2218 02/15/21 0029 02/15/21 0540 02/16/21 0506 02/17/21 0641  WBC 8.1  --  5.7 6.2 6.0  LATICACIDVEN 2.1* 1.3  --   --   --     Microbiology Recent Results (from the past 240 hour(s))  Culture, blood (routine x 2)     Status: None (Preliminary result)   Collection Time: 02/14/21 10:19 PM   Specimen: Right Antecubital; Blood  Result Value Ref Range Status   Specimen Description   Final    RIGHT ANTECUBITAL BOTTLES DRAWN AEROBIC AND ANAEROBIC   Special Requests Blood Culture adequate volume  Final   Culture   Final    NO GROWTH 2 DAYS Performed at [facility], [address] [place], [place] [address]    Report Status PENDING  Incomplete  Resp Panel by RT-PCR (Flu A&B, Covid) Nasopharyngeal Swab     Status: Abnormal   Collection Time: 02/14/21 10:21 PM   Specimen: Nasopharyngeal Swab; Nasopharyngeal(NP) swabs in vial transport medium  Result Value Ref Range Status   SARS Coronavirus 2 by RT PCR POSITIVE (A) NEGATIVE Final    Comment: (NOTE) SARS-CoV-2 target nucleic acids are DETECTED.  The SARS-CoV-2 RNA is generally detectable  in upper respiratory specimens during the acute phase of infection. Positive results are indicative of the presence of the identified virus, but do not rule out bacterial infection or co-infection with other pathogens not detected by the test. Clinical correlation with patient history and other diagnostic information is necessary to determine patient infection status. The expected result is Negative.  Fact Sheet for Patients: EntrepreneurPulse.com.au  Fact Sheet for Healthcare Providers: IncredibleEmployment.be  This test is not yet approved or cleared by the [place] FDA and  has been authorized for detection and/or diagnosis of SARS-CoV-2 by FDA under an Emergency Use Authorization (EUA).  This EUA will remain in effect (meaning this test can be used) for the duration of  the COVID-19 declaration under Section 564(b)(1) of the A ct, 21 U.S.C. section 360bbb-3(b)(1), unless the authorization is terminated or revoked sooner.     Influenza A by PCR NEGATIVE NEGATIVE Final   Influenza B by PCR NEGATIVE NEGATIVE Final    Comment: (NOTE) The Xpert Xpress SARS-CoV-2/FLU/RSV plus assay is intended as an aid in the diagnosis of influenza from Nasopharyngeal swab specimens and should not be used as a sole basis for treatment. Nasal washings and aspirates are unacceptable for Xpert Xpress SARS-CoV-2/FLU/RSV testing.  Fact Sheet for Patients: EntrepreneurPulse.com.au  Fact Sheet for Healthcare Providers: IncredibleEmployment.be  This test is not yet approved or cleared by the [place] FDA and has been authorized for detection and/or diagnosis of SARS-CoV-2 by FDA under an Emergency Use Authorization (EUA). This EUA will remain in effect (meaning this test can be used) for the duration of the COVID-19 declaration under Section 564(b)(1) of the Act, 21 U.S.C. section 360bbb-3(b)(1), unless the authorization  is terminated or revoked.  Performed at [facility], [address], [place], [place] [address]   Culture, blood (routine x 2)     Status: None (Preliminary result)   Collection Time: 02/14/21 10:24 PM   Specimen: Right Antecubital; Blood  Result Value Ref Range Status   Specimen Description   Final    RIGHT ANTECUBITAL BOTTLES DRAWN AEROBIC AND ANAEROBIC   Special Requests Blood Culture adequate volume  Final   Culture   Final    NO GROWTH 2 DAYS Performed at [facility], [address], [place], [place] [address]    Report Status PENDING  Incomplete    Procedures and diagnostic studies:  No results found.  Medications:    vitamin C  500 mg Oral Daily   baclofen  10 mg Oral TID   celecoxib  200 mg Oral BID   enoxaparin (LOVENOX) injection  40 mg Subcutaneous Q24H   feeding supplement  237 mL Oral BID BM   gabapentin  600 mg Oral TID   pantoprazole (PROTONIX) IV  40 mg Intravenous QHS   propranolol  20 mg Oral BID   zinc sulfate  220 mg Oral Daily   Continuous Infusions:   LOS: 1 day   Geradine Girt  [facility]   How to contact the [facility] Attending or Consulting provider [place] or covering provider during after hours [place], for this patient?  Check the care team in [facility] and look for a) attending/consulting TRH provider listed and b) the [facility] team listed Log into [email or web address] and use [place]'s universal password to access. If you do not have the password, please contact the hospital operator. Locate the [facility] provider you are looking for under [facility] and page to a number that you can be directly reached. If you still have difficulty reaching the provider, please page the [facility] (Director on Call) for the Hospitalists listed on amion for assistance.  02/17/2021, 10:19 AM

## 2021-02-17 NOTE — TOC Transition Note (Signed)
Transition of Care [facility]) - CM/SW Discharge Note   Patient Details  Name: Faith [name] MRN: [id number] Date of Birth: 1955/09/26  [facility] CM/SW Contact:  Iona Beard, [place] Phone Number: 02/17/2021, 2:08 PM   Clinical Narrative:    CSW confirmed with [facility] that they have received the needed contract from [facility] and pt can arrive to facility. Pt will be going to B16 bed 2. CSW provided RN with number for report and room number pt will be going to at the facility. CSW spoke with pts son [name] to update of pts discharge. CSW also left HIPPA compliant vm for pts daughter [name]. TOC signing off.   Final next level of care: Skilled Nursing Facility Barriers to Discharge: Barriers Resolved   Patient Goals and CMS Choice Patient states their goals for this hospitalization and ongoing recovery are:: Go to SNF CMS Medicare.gov Compare Post Acute Care list provided to:: Patient Represenative (must comment) Choice offered to / list presented to : Adult Children  Discharge Placement              Patient chooses bed at: Other - please specify in the comment section below: Patient to be transferred to facility by: [facility] Name of family member notified: [name] and [name] Patient and family notified of of transfer: 02/17/21  Discharge Plan and Services In-house Referral: Clinical Social Work   Post Acute Care Choice: [place]                               Social Determinants of Health (SDOH) Interventions     Readmission Risk Interventions Readmission Risk Prevention Plan 02/16/2021 12/12/2020 03/31/2020  Post Dischage Appt - Complete Complete  Medication Screening - Complete Complete  Transportation Screening Complete Complete Complete  Home Care Screening Complete - -  Medication Review (RN CM) Complete - -  Some recent data might be hidden

## 2021-02-17 NOTE — Progress Notes (Addendum)
Initial Nutrition Assessment  DOCUMENTATION CODES:   Non-severe (moderate) malnutrition in context of chronic illness  INTERVENTION:  Ensure Enlive po BID  Ensure Max -daily (Protein supplement- 150 kcal, 30 gr protein)  Multivitamin daily (recommend continue at home)  NUTRITION DIAGNOSIS:   Moderate Malnutrition related to chronic illness (COPD, MS, PI) as evidenced by  (Eating pattern of 2 meals daily in the setting of increased protein/energy needs related to Stage 4 wound and lung disease) and BMI 17.52.   GOAL:  Patient will meet greater than or equal to 90% of their needs   MONITOR:  PO intake, Supplement acceptance, Labs, Skin, Weight trends  REASON FOR ASSESSMENT:   Consult, Malnutrition Screening Tool    ASSESSMENT: Patient is an underweight 65 yo female from home with hx of MS, COPD (emphysema), pressure ulcer (stage 4 -sacrum) who presents with weakness, hypotension and hypoxia. COVID-19 infection, dehydration, Failure to thrive.   Patient is observation status. Regular diet with po intake 50-75% meals. Patient denies change in eating pattern since last hospitalization in October. She typically eats 2 meals daily. Encouraged a balanced diet and foods that are rich in nutrients.  Able to feed herself. Patient affirms she has access to food. Lives with son. Chronic suboptimal nutrition intake in the setting of increased protein/energy needs with stage 4 PI.   Medications: Vit C, Zinc, Protonix.   Patient has maintained weight between 44-46 kg the past 14 months. Minimal fat /muscle reserves.  Labs: BMP Latest Ref Rng & Units 02/16/2021 02/14/2021 12/25/2020  Glucose 70 - 99 mg/dL 76 126(H) 96  BUN 8 - 23 mg/dL 12 27(H) 27(H)  Creatinine 0.44 - 1.00 mg/dL 0.45 0.88 0.60  BUN/Creat Ratio 6 - 22 (calc) - - -  Sodium 135 - 145 mmol/L 140 137 135  Potassium 3.5 - 5.1 mmol/L 3.4(L) 3.4(L) 5.2(H)  Chloride 98 - 111 mmol/L 109 104 104  CO2 22 - 32 mmol/L 24 26 25    Calcium 8.9 - 10.3 mg/dL 7.8(L) 7.8(L) 9.0     Wheelchair dependent the past 2 years per patient.  Nutrition-Focused physical exam completed. Findings are moderate buccal, severe upper arms, thoracic fat depletion, moderate dorsal and severe clavicle/deltoid muscle depletion, and no edema.     Diet Order:   Diet Order             Diet regular Room service appropriate? Yes; Fluid consistency: Thin  Diet effective now                   EDUCATION NEEDS:  Education needs have been addressed  Skin:  Skin Assessment: Skin Integrity Issues: Skin Integrity Issues:: Stage IV Stage IV: sacrum  Last BM:  12/17  Height:   Ht Readings from Last 1 Encounters:  02/15/21 5\' 4"  (1.626 m)    Weight:   Wt Readings from Last 1 Encounters:  02/15/21 46.3 kg    Ideal Body Weight:   55 kg  BMI:  Body mass index is 17.52 kg/m.  Estimated Nutritional Needs:   Kcal:  1600-1700  Protein:  70-75 gr  Fluid:  >1300 ml daily  Colman Cater MS,RD,CSG,LDN Contact: Shea Evans

## 2021-02-17 NOTE — Discharge Summary (Signed)
Physician Discharge Summary  ROSENDA GEFFRARD XNA:[id number] DOB: Apr 20, 1955 DOA: 02/14/2021  PCP: Janifer Adie, MD  Admit date: 02/14/2021 Discharge date: 02/17/2021  Admitted From: home Discharge disposition: short term SNF   Recommendations for Outpatient Follow-Up:   See wound care below BMP 1 week re: K Inderal at a lower dose, adjust as able   Discharge Diagnosis:   Principal Problem:   COVID-19 virus infection Active Problems:   Multiple sclerosis ([place])   Mixed hyperlipidemia   Essential hypertension   Dehydration   Failure to thrive in adult   Hypokalemia   Lactic acidosis   Generalized weakness   Pressure injury of skin    Discharge Condition: Improved.  Diet recommendation:   Regular. Ensure Enlive po BID   Ensure Max -daily (Protein supplement- 150 kcal, 30 gr protein)   Continue Vitamin C- 500 mg daily  Wound care: see below  Code status: Full.   History of Present Illness:   [name] is a 65 y.o. female with medical history significant for COPD, hypertension, multiple sclerosis, multiple sclerosis, wheelchair dependent, pressure ulcer in the right buttock who presents to the emergency department via EMS due to several days onset of weakness and oral intake.  Patient states that she went to [facility] yesterday with her son to the wound center due to her stage IV decubitus pressure ulcer and that she has been so tired and weak due to the trip, so she spent most of yesterday sleeping.  Son activated EMS because he thought that she was "weaker than usual".  On arrival of EMS team, patient was said to be hypotensive with SBP in the 80s and hypoxic with O2 sat of 87% on room air.  Supplemental oxygen therapy was provided,  IV NS 191mls was provided en route.  There was no report of fever, chills, chest pain, shortness of breath.  Patient denies loss of taste or smell, she states that her decreased appetite was related to history of MS.  Patient  states that she had second COVID-19 booster on Friday (12/16).   Hospital Course by Problem:   Generalized weakness -patient says she is back to her baseline PT eval while patient is in the hospital and at SNF    COVID-19 virus infection-incidental finding only -does not want treatment, says she is not symptomatic Patient states that she just had her second COVID 19 booster on Friday-12/16 Continue vitamin-C 500 mg p.o. Daily Continue zinc 220 mg p.o. Daily Continue Tylenol p.r.n. for fever  Hypomagnesemia -repelted   Dehydration -resolved  Lactic acidosis -resolved  Hypokalemia -replete   MS Continue Celebrex,  gabapentin and as needed Vicodin   Hypertension -resume home meds at a lower dose as BP improved     Will need short stay at SNF until [facility] able to resume her wound care-- ? 10 days after positive test      Medical Consultants:      Discharge Exam:   Vitals:   02/17/21 0535 02/17/21 0844  BP: 127/81 126/85  Pulse: 65 69  Resp: 18   Temp: 98.2 F (36.8 C)   SpO2: 96%    Vitals:   02/16/21 1446 02/16/21 2132 02/17/21 0535 02/17/21 0844  BP: 127/79 132/79 127/81 126/85  Pulse: 89 75 65 69  Resp:  18 18   Temp:  98 F (36.7 C) 98.2 F (36.8 C)   TempSrc:      SpO2: 95% 100% 96%  Weight:      Height:        General exam: Appears calm and comfortable.   The results of significant diagnostics from this hospitalization (including imaging, microbiology, ancillary and laboratory) are listed below for reference.     Procedures and Diagnostic Studies:   DG Chest Port 1 View  Result Date: 02/14/2021 CLINICAL DATA:  Weakness, shortness of breath and hypoxia. EXAM: PORTABLE CHEST 1 VIEW COMPARISON:  Portable chest 12/10/2020. FINDINGS: Heart size and central vasculature are normal. There is patchy aortic calcification and slight tortuosity. The sulci are sharp. The lungs are emphysematous but clear. Osteopenia and thoracic spondylosis.  Multiple overlying monitor wires. IMPRESSION: No evidence of acute chest disease.  Stable COPD chest. Electronically Signed   By: Telford Nab M.D.   On: 02/14/2021 23:16     Labs:   Basic Metabolic Panel: Recent Labs  Lab 02/14/21 2218 02/15/21 0540 02/16/21 0506 02/17/21 0641  NA 137  --  140  --   K 3.4*  --  3.4*  --   CL 104  --  109  --   CO2 26  --  24  --   GLUCOSE 126*  --  76  --   BUN 27*  --  12  --   CREATININE 0.88  --  0.45  --   CALCIUM 7.8*  --  7.8*  --   MG  --  1.7 1.6* 2.0  PHOS  --  2.8 2.9 3.2   GFR Estimated Creatinine Clearance: 51.9 mL/min (by C-G formula based on SCr of 0.45 mg/dL). Liver Function Tests: No results for input(s): AST, ALT, ALKPHOS, BILITOT, PROT, ALBUMIN in the last 168 hours. No results for input(s): LIPASE, AMYLASE in the last 168 hours. No results for input(s): AMMONIA in the last 168 hours. Coagulation profile No results for input(s): INR, PROTIME in the last 168 hours.  CBC: Recent Labs  Lab 02/14/21 2218 02/15/21 0540 02/16/21 0506 02/17/21 0641  WBC 8.1 5.7 6.2 6.0  NEUTROABS  --  3.2 3.9 3.5  HGB 10.7* 10.0* 10.6* 10.0*  HCT 33.5* 31.2* 33.1* 31.2*  MCV 95.7 93.4 92.2 94.0  PLT 188 181 196 176   Cardiac Enzymes: No results for input(s): CKTOTAL, CKMB, CKMBINDEX, TROPONINI in the last 168 hours. BNP: Invalid input(s): POCBNP CBG: Recent Labs  Lab 02/14/21 2155  GLUCAP 121*   D-Dimer Recent Labs    02/15/21 0540 02/16/21 0506  DDIMER 0.73* 0.76*   Hgb A1c No results for input(s): HGBA1C in the last 72 hours. Lipid Profile No results for input(s): CHOL, HDL, LDLCALC, TRIG, CHOLHDL, LDLDIRECT in the last 72 hours. Thyroid function studies No results for input(s): TSH, T4TOTAL, T3FREE, THYROIDAB in the last 72 hours.  Invalid input(s): FREET3 Anemia work up Recent Labs    02/15/21 0540 02/16/21 0507  FERRITIN 45 31   Microbiology Recent Results (from the past 240 hour(s))  Culture, blood  (routine x 2)     Status: None (Preliminary result)   Collection Time: 02/14/21 10:19 PM   Specimen: Right Antecubital; Blood  Result Value Ref Range Status   Specimen Description   Final    RIGHT ANTECUBITAL BOTTLES DRAWN AEROBIC AND ANAEROBIC   Special Requests Blood Culture adequate volume  Final   Culture   Final    NO GROWTH 2 DAYS Performed at [facility], [address] [place], [place] [address]    Report Status PENDING  Incomplete  Resp Panel by RT-PCR (  Flu A&B, Covid) Nasopharyngeal Swab     Status: Abnormal   Collection Time: 02/14/21 10:21 PM   Specimen: Nasopharyngeal Swab; Nasopharyngeal(NP) swabs in vial transport medium  Result Value Ref Range Status   SARS Coronavirus 2 by RT PCR POSITIVE (A) NEGATIVE Final    Comment: (NOTE) SARS-CoV-2 target nucleic acids are DETECTED.  The SARS-CoV-2 RNA is generally detectable in upper respiratory specimens during the acute phase of infection. Positive results are indicative of the presence of the identified virus, but do not rule out bacterial infection or co-infection with other pathogens not detected by the test. Clinical correlation with patient history and other diagnostic information is necessary to determine patient infection status. The expected result is Negative.  Fact Sheet for Patients: EntrepreneurPulse.com.au  Fact Sheet for Healthcare Providers: IncredibleEmployment.be  This test is not yet approved or cleared by the [place] FDA and  has been authorized for detection and/or diagnosis of SARS-CoV-2 by FDA under an Emergency Use Authorization (EUA).  This EUA will remain in effect (meaning this test can be used) for the duration of  the COVID-19 declaration under Section 564(b)(1) of the A ct, 21 U.S.C. section 360bbb-3(b)(1), unless the authorization is terminated or revoked sooner.     Influenza A by PCR NEGATIVE NEGATIVE Final   Influenza B by PCR NEGATIVE  NEGATIVE Final    Comment: (NOTE) The Xpert Xpress SARS-CoV-2/FLU/RSV plus assay is intended as an aid in the diagnosis of influenza from Nasopharyngeal swab specimens and should not be used as a sole basis for treatment. Nasal washings and aspirates are unacceptable for Xpert Xpress SARS-CoV-2/FLU/RSV testing.  Fact Sheet for Patients: EntrepreneurPulse.com.au  Fact Sheet for Healthcare Providers: IncredibleEmployment.be  This test is not yet approved or cleared by the [place] FDA and has been authorized for detection and/or diagnosis of SARS-CoV-2 by FDA under an Emergency Use Authorization (EUA). This EUA will remain in effect (meaning this test can be used) for the duration of the COVID-19 declaration under Section 564(b)(1) of the Act, 21 U.S.C. section 360bbb-3(b)(1), unless the authorization is terminated or revoked.  Performed at [facility], [address] [place], [place] [address]   Culture, blood (routine x 2)     Status: None (Preliminary result)   Collection Time: 02/14/21 10:24 PM   Specimen: Right Antecubital; Blood  Result Value Ref Range Status   Specimen Description   Final    RIGHT ANTECUBITAL BOTTLES DRAWN AEROBIC AND ANAEROBIC   Special Requests Blood Culture adequate volume  Final   Culture   Final    NO GROWTH 2 DAYS Performed at [facility], [address], [place], [place] [address]    Report Status PENDING  Incomplete     Discharge Instructions:   Discharge Instructions     Diet general   Complete by: As directed    Discharge wound care:   Complete by: As directed    Wound care to sacral Stage 4 pressure injury:  Cleanse with NS, pat gently dry. Fill defect to skin level with silver hydrofiber (Aquacel Ag Sheliah Hatch # [id number]). Top with dry gauze, ABD pad and secure with Medipore tape. Change daily and PRN soiling. Turn patient side to side.   Increase activity slowly   Complete by: As  directed       Allergies as of 02/17/2021       Reactions   Darvon [propoxyphene] Other (See Comments)   Hallicuations   Penicillins Rash   Did it involve swelling  of the face/tongue/throat, SOB, or low BP? Unknown Did it involve sudden or severe rash/hives, skin peeling, or any reaction on the inside of your mouth or nose? Unknown Did you need to seek medical attention at a hospital or doctor's office? Unknown When did it last happen?      Unknown If all above answers are NO, may proceed with cephalosporin use.        Medication List     STOP taking these medications    atorvastatin 20 MG tablet Commonly known as: LIPITOR   ibuprofen 800 MG tablet Commonly known as: ADVIL       TAKE these medications    ascorbic acid 500 MG tablet Commonly known as: VITAMIN C Take 1 tablet (500 mg total) by mouth daily. Start taking on: February 18, 2021   baclofen 10 MG tablet Commonly known as: LIORESAL Take 1 tablet (10 mg total) by mouth 3 (three) times daily.   celecoxib 200 MG capsule Commonly known as: CELEBREX Take 1 capsule (200 mg total) by mouth 2 (two) times daily.   Cholecalciferol 50 MCG (2000 UT) Tabs Take 2,000 Int'l Units by mouth daily.   diazepam 5 MG tablet Commonly known as: VALIUM Take 1 tablet (5 mg total) by mouth See admin instructions. TAKE (1) TABLET BY MOUTH EVERY SIX HOURS AS NEEDED FOR MUSCLE SPASMS. What changed: See the new instructions.   feeding supplement Liqd Take 237 mLs by mouth 3 (three) times daily between meals.   gabapentin 300 MG capsule Commonly known as: Neurontin Take 2 capsules (600 mg total) by mouth 3 (three) times daily.   HYDROcodone-acetaminophen 5-325 MG tablet Commonly known as: NORCO/VICODIN Take 1 tablet by mouth every 6 (six) hours as needed for moderate pain.   metroNIDAZOLE 500 MG tablet Commonly known as: FLAGYL 500 mg daily. Topical for wound   pantoprazole 40 MG tablet Commonly known as:  Protonix Take 1 tablet (40 mg total) by mouth daily.   polyethylene glycol 17 g packet Commonly known as: MiraLax Take 17 g by mouth daily.   propranolol 40 MG tablet Commonly known as: INDERAL Take 0.5 tablets (20 mg total) by mouth 2 (two) times daily. What changed: how much to take   sodium hypochlorite 0.125 % Soln Commonly known as: DAKIN'S 1/4 STRENGTH Apply topically.   zinc sulfate 220 (50 Zn) MG capsule Take 1 capsule (220 mg total) by mouth daily. Start taking on: February 18, 2021               Discharge Care Instructions  (From admission, onward)           Start     Ordered   02/17/21 0000  Discharge wound care:       Comments: Wound care to sacral Stage 4 pressure injury:  Cleanse with NS, pat gently dry. Fill defect to skin level with silver hydrofiber (Aquacel Ag Sheliah Hatch # [id number]). Top with dry gauze, ABD pad and secure with Medipore tape. Change daily and PRN soiling. Turn patient side to side.   02/17/21 1221            Follow-up Information     Janifer Adie, MD Follow up in 1 week(s).   Specialty: Family Medicine Contact information: [place] [address] [phone]                  Time coordinating discharge: 35 min  Signed:  Geradine Girt DO  [facility]  02/17/2021, 12:29 PM

## 2021-02-18 ENCOUNTER — Telehealth: Payer: Self-pay

## 2021-02-18 NOTE — Telephone Encounter (Signed)
Transition Care Management Unsuccessful Follow-up Telephone Call  Date of discharge and from where:  02/17/2021[name]   Attempts:  1st Attempt  Reason for unsuccessful TCM follow-up call:  Left voice message

## 2021-02-19 LAB — CULTURE, BLOOD (ROUTINE X 2)
Culture: NO GROWTH
Culture: NO GROWTH
Special Requests: ADEQUATE
Special Requests: ADEQUATE

## 2021-02-19 NOTE — Telephone Encounter (Signed)
Transition Care Management Unsuccessful Follow-up Telephone Call  Date of discharge and from where:  02/17/2021 from [facility]  Attempts:  2nd Attempt  Reason for unsuccessful TCM follow-up call:  Left voice message

## 2021-02-20 ENCOUNTER — Ambulatory Visit: Payer: Medicaid Other | Admitting: Physician Assistant

## 2021-02-20 NOTE — Telephone Encounter (Signed)
Transition Care Management Unsuccessful Follow-up Telephone Call  Date of discharge and from where:  02/17/2021-[facility]  Attempts:  3rd Attempt  Reason for unsuccessful TCM follow-up call:  Left voice message

## 2021-03-05 ENCOUNTER — Encounter: Payer: Medicaid Other | Attending: Physician Assistant | Admitting: Physician Assistant

## 2021-03-05 ENCOUNTER — Other Ambulatory Visit: Payer: Self-pay

## 2021-03-05 DIAGNOSIS — I10 Essential (primary) hypertension: Secondary | ICD-10-CM | POA: Diagnosis not present

## 2021-03-05 DIAGNOSIS — L89154 Pressure ulcer of sacral region, stage 4: Secondary | ICD-10-CM | POA: Diagnosis present

## 2021-03-05 DIAGNOSIS — G35 Multiple sclerosis: Secondary | ICD-10-CM | POA: Insufficient documentation

## 2021-03-05 DIAGNOSIS — J449 Chronic obstructive pulmonary disease, unspecified: Secondary | ICD-10-CM | POA: Diagnosis not present

## 2021-03-05 DIAGNOSIS — F17210 Nicotine dependence, cigarettes, uncomplicated: Secondary | ICD-10-CM | POA: Insufficient documentation

## 2021-03-05 NOTE — Progress Notes (Addendum)
LEODA, SMITHHART ([id number]) Visit Report for 03/05/2021 Allergy List Details Patient Name: Faith [name], [name] Date of Service: 03/05/2021 10:00 AM Medical Record Number: [id number] Patient Account Number: [id number] Date of Birth/Sex: [birth year] (65 y.o. F) Treating RN: Donnamarie Poag Primary Care Thomas Rhude: Barney Drain Other Clinician: Referring Jaylyn Booher: Barney Drain Treating Zilphia Kozinski/Extender: Jeri Cos Weeks in Treatment: 0 Allergies Active Allergies penicillin Severity: Moderate Darvon Severity: Moderate Allergy Notes Electronic Signature(s) Signed: 03/05/2021 1:17:01 PM By: Donnamarie Poag Entered By: Donnamarie Poag on 03/05/2021 10:36:28 Mallen, Lakyia [name] Kitchen ([id number]) -------------------------------------------------------------------------------- Arrival Information Details Patient Name: Faith [name], [name] Date of Service: 03/05/2021 10:00 AM Medical Record Number: [id number] Patient Account Number: [id number] Date of Birth/Sex: 1955-11-25 (65 y.o. F) Treating RN: Donnamarie Poag Primary Care Rigel Filsinger: Barney Drain Other Clinician: Referring Rukiya Hodgkins: Barney Drain Treating Brendin Situ/Extender: Skipper Cliche in Treatment: 0 Visit Information Patient Arrived: Wheel Chair Arrival Time: 10:23 Accompanied By: self Transfer Assistance: Manual Patient Identification Verified: Yes Secondary Verification Process Completed: Yes Patient Requires Transmission-Based Precautions: No Patient Has Alerts: Yes Patient Alerts: NOT diabetic Fax to [facility] Electronic Signature(s) Signed: 03/12/2021 10:58:27 AM By: Donnamarie Poag Previous Signature: 03/05/2021 1:17:01 PM Version By: Donnamarie Poag Entered By: Donnamarie Poag on 03/12/2021 10:58:27 Faith [name], [name] ([id number]) -------------------------------------------------------------------------------- Clinic Level of Care Assessment Details Patient Name: Faith Hale A. Date of Service: 03/05/2021 10:00 AM Medical Record Number: [id number] Patient Account  Number: [id number] Date of Birth/Sex: Aug 11, 1955 (65 y.o. F) Treating RN: Donnamarie Poag Primary Care Trimaine Maser: Barney Drain Other Clinician: Referring Jhade Berko: Barney Drain Treating Anuja Manka/Extender: Skipper Cliche in Treatment: 0 Clinic Level of Care Assessment Items TOOL 2 Quantity Score []  - Use when only an EandM is performed on the INITIAL visit 0 ASSESSMENTS - Nursing Assessment / Reassessment X - General Physical Exam (combine w/ comprehensive assessment (listed just below) when performed on new 1 20 pt. evals) X- 1 25 Comprehensive Assessment (HX, ROS, Risk Assessments, Wounds Hx, etc.) ASSESSMENTS - Wound and Skin Assessment / Reassessment X - Simple Wound Assessment / Reassessment - one wound 1 5 []  - 0 Complex Wound Assessment / Reassessment - multiple wounds []  - 0 Dermatologic / Skin Assessment (not related to wound area) ASSESSMENTS - Ostomy and/or Continence Assessment and Care []  - Incontinence Assessment and Management 0 []  - 0 Ostomy Care Assessment and Management (repouching, etc.) PROCESS - Coordination of Care []  - Simple Patient / Family Education for ongoing care 0 X- 1 20 Complex (extensive) Patient / Family Education for ongoing care X- 1 10 Staff obtains Consents, Records, Test Results / Process Orders X- 1 10 Staff telephones HHA, Nursing Homes / Clarify orders / etc []  - 0 Routine Transfer to another Facility (non-emergent condition) []  - 0 Routine Hospital Admission (non-emergent condition) []  - 0 New Admissions / Biomedical engineer / Ordering NPWT, Apligraf, etc. []  - 0 Emergency Hospital Admission (emergent condition) X- 1 10 Simple Discharge Coordination []  - 0 Complex (extensive) Discharge Coordination PROCESS - Special Needs []  - Pediatric / Minor Patient Management 0 []  - 0 Isolation Patient Management []  - 0 Hearing / Language / Visual special needs []  - 0 Assessment of Community assistance (transportation, D/C  planning, etc.) []  - 0 Additional assistance / Altered mentation []  - 0 Support Surface(s) Assessment (bed, cushion, seat, etc.) INTERVENTIONS - Wound Cleansing / Measurement X - Wound Imaging (photographs - any number of wounds) 1 5 []  - 0 Wound Tracing (instead of photographs) X- 1 5 Simple Wound Measurement - one wound []  -  0 Complex Wound Measurement - multiple wounds Faith [name], [name] ([id number]) X- 1 5 Simple Wound Cleansing - one wound []  - 0 Complex Wound Cleansing - multiple wounds INTERVENTIONS - Wound Dressings []  - Small Wound Dressing one or multiple wounds 0 []  - 0 Medium Wound Dressing one or multiple wounds X- 1 20 Large Wound Dressing one or multiple wounds []  - 0 Application of Medications - injection INTERVENTIONS - Miscellaneous []  - External ear exam 0 []  - 0 Specimen Collection (cultures, biopsies, blood, body fluids, etc.) []  - 0 Specimen(s) / Culture(s) sent or taken to Lab for analysis []  - 0 Patient Transfer (multiple staff / Civil Service fast streamer / Similar devices) []  - 0 Simple Staple / Suture removal (25 or less) []  - 0 Complex Staple / Suture removal (26 or more) []  - 0 Hypo / Hyperglycemic Management (close monitor of Blood Glucose) []  - 0 Ankle / Brachial Index (ABI) - do not check if billed separately Has the patient been seen at the hospital within the last three years: Yes Total Score: 135 Level Of Care: New/Established - Level 4 Electronic Signature(s) Signed: 03/05/2021 1:17:01 PM By: Donnamarie Poag Entered By: Donnamarie Poag on 03/05/2021 11:31:56 Faith [name], [name] ([id number]) -------------------------------------------------------------------------------- Encounter Discharge Information Details Patient Name: Faith [name], [name] Date of Service: 03/05/2021 10:00 AM Medical Record Number: [id number] Patient Account Number: [id number] Date of Birth/Sex: Sep 23, 1955 (65 y.o. F) Treating RN: Donnamarie Poag Primary Care Lorry Furber: Barney Drain Other  Clinician: Referring Sharief Wainwright: Barney Drain Treating Avagrace Botelho/Extender: Skipper Cliche in Treatment: 0 Encounter Discharge Information Items Discharge Condition: Stable Ambulatory Status: Wheelchair Discharge Destination: Home Transportation: Other Accompanied By: self Schedule Follow-up Appointment: Yes Clinical Summary of Care: Electronic Signature(s) Signed: 03/05/2021 1:17:01 PM By: Donnamarie Poag Entered By: Donnamarie Poag on 03/05/2021 11:39:10 Aicher, Alicia A. ([id number]) -------------------------------------------------------------------------------- Lower Extremity Assessment Details Patient Name: Faith [name], Breezy A. Date of Service: 03/05/2021 10:00 AM Medical Record Number: [id number] Patient Account Number: [id number] Date of Birth/Sex: 1955/12/05 (65 y.o. F) Treating RN: Donnamarie Poag Primary Care Tatayana Beshears: Barney Drain Other Clinician: Referring Myracle Febres: Barney Drain Treating Ester Hilley/Extender: Jeri Cos Weeks in Treatment: 0 Electronic Signature(s) Signed: 03/05/2021 1:17:01 PM By: Donnamarie Poag Entered By: Donnamarie Poag on 03/05/2021 10:39:32 Brien, Burnett A. ([id number]) -------------------------------------------------------------------------------- Multi Wound Chart Details Patient Name: Faith [name], Laurann A. Date of Service: 03/05/2021 10:00 AM Medical Record Number: [id number] Patient Account Number: [id number] Date of Birth/Sex: 02/25/1956 (65 y.o. F) Treating RN: Donnamarie Poag Primary Care Azilee Pirro: Barney Drain Other Clinician: Referring Oaklyn Jakubek: Barney Drain Treating Kareema Keitt/Extender: Skipper Cliche in Treatment: 0 Vital Signs Height(in): 93 Pulse(bpm): 20 Weight(lbs): 53 Blood Pressure(mmHg): 106/71 Body Mass Index(BMI): 16 Temperature(F): 97.4 Respiratory Rate(breaths/min): 16 Photos: [N/A:N/A] Wound Location: Sacrum N/A N/A Wounding Event: Pressure Injury N/A N/A Primary Etiology: Pressure Ulcer N/A N/A Secondary Etiology: Auto-immune N/A  N/A Comorbid History: Anemia, Chronic Obstructive N/A N/A Pulmonary Disease (COPD), Coronary Artery Disease, Hypertension, Osteoarthritis Date Acquired: 08/03/2020 N/A N/A Weeks of Treatment: 0 N/A N/A Wound Status: Open N/A N/A Measurements L x W x D (cm) 5.7x2.6x2.5 N/A N/A Area (cm) : 11.64 N/A N/A Volume (cm) : 29.099 N/A N/A Position 1 (o'clock): 6 Maximum Distance 1 (cm): 7 Position 2 (o'clock): Starting Position 1 (o'clock): 12 Ending Position 1 (o'clock): 12 Maximum Distance 1 (cm): 4.2 Tunneling: Yes N/A N/A Undermining: Yes N/A N/A Classification: Category/Stage IV N/A N/A Exudate Amount: Large N/A N/A Exudate Type: Serosanguineous N/A N/A Exudate Color: red, Winegar N/A N/A Granulation Amount: Medium (34-66%) N/A N/A Granulation  Quality: Red, Pink N/A N/A Necrotic Amount: Medium (34-66%) N/A N/A Exposed Structures: Fat Layer (Subcutaneous Tissue): N/A N/A Yes Muscle: Yes Fascia: No Tendon: No Joint: No Bone: No Treatment Notes Electronic Signature(s) ADDIE, ALONGE A. ([id number]) Signed: 03/05/2021 1:17:01 PM By: Donnamarie Poag Entered By: Donnamarie Poag on 03/05/2021 11:07:35 Faith [name], [name] ([id number]) -------------------------------------------------------------------------------- [place] Details Patient Name: Faith Hale A. Date of Service: 03/05/2021 10:00 AM Medical Record Number: [id number] Patient Account Number: [id number] Date of Birth/Sex: [birth year] (65 y.o. F) Treating RN: Donnamarie Poag Primary Care Sidrah Harden: Barney Drain Other Clinician: Referring Nikolus Marczak: Barney Drain Treating Torion Hulgan/Extender: Skipper Cliche in Treatment: 0 Active Inactive Orientation to the Wound Care Program Nursing Diagnoses: Knowledge deficit related to the wound healing center program Goals: Patient/caregiver will verbalize understanding of the [facility] Date Initiated: 03/05/2021 Target Resolution Date: 03/15/2021 Goal Status:  Active Interventions: Provide education on orientation to the wound center Notes: Pressure Nursing Diagnoses: Knowledge deficit related to causes and risk factors for pressure ulcer development Knowledge deficit related to management of pressures ulcers Potential for impaired tissue integrity related to pressure, friction, moisture, and shear Goals: Patient will remain free from development of additional pressure ulcers Date Initiated: 03/05/2021 Target Resolution Date: 03/31/2021 Goal Status: Active Patient/caregiver will verbalize risk factors for pressure ulcer development Date Initiated: 03/05/2021 Target Resolution Date: 03/31/2021 Goal Status: Active Interventions: Assess: immobility, friction, shearing, incontinence upon admission and as needed Notes: Wound/Skin Impairment Nursing Diagnoses: Impaired tissue integrity Knowledge deficit related to smoking impact on wound healing Knowledge deficit related to ulceration/compromised skin integrity Goals: Patient will demonstrate a reduced rate of smoking or cessation of smoking Date Initiated: 03/05/2021 Target Resolution Date: 04/28/2021 Goal Status: Active Patient/caregiver will verbalize understanding of skin care regimen Date Initiated: 03/05/2021 Target Resolution Date: 03/25/2021 Goal Status: Active Ulcer/skin breakdown will have a volume reduction of 30% by week 4 Date Initiated: 03/05/2021 Target Resolution Date: 04/02/2021 Goal Status: Active Ulcer/skin breakdown will have a volume reduction of 50% by week 8 Faith [name], Faith [name] ([id number]) Date Initiated: 03/05/2021 Target Resolution Date: 04/30/2021 Goal Status: Active Ulcer/skin breakdown will have a volume reduction of 80% by week 12 Date Initiated: 03/05/2021 Target Resolution Date: 05/28/2021 Goal Status: Active Ulcer/skin breakdown will heal within 14 weeks Date Initiated: 03/05/2021 Target Resolution Date: 06/25/2021 Goal Status: Active Interventions: Assess patient/caregiver  ability to obtain necessary supplies Assess patient/caregiver ability to perform ulcer/skin care regimen upon admission and as needed Assess ulceration(s) every visit Provide education on smoking Notes: Electronic Signature(s) Signed: 03/05/2021 1:17:01 PM By: Donnamarie Poag Entered By: Donnamarie Poag on 03/05/2021 11:07:19 Faith [name], Faith [place]. ([id number]) -------------------------------------------------------------------------------- Pain Assessment Details Patient Name: Faith [name], Ayana A. Date of Service: 03/05/2021 10:00 AM Medical Record Number: [id number] Patient Account Number: [id number] Date of Birth/Sex: [birth year] (65 y.o. F) Treating RN: Donnamarie Poag Primary Care Cassie Shedlock: Barney Drain Other Clinician: Referring Aleese Kamps: Barney Drain Treating Khalib Fendley/Extender: Skipper Cliche in Treatment: 0 Active Problems Location of Pain Severity and Description of Pain Patient Has Paino Yes Site Locations Pain Location: Generalized Pain, Pain in Ulcers Rate the pain. Current Pain Level: 4 Pain Management and Medication Current Pain Management: Electronic Signature(s) Signed: 03/05/2021 1:17:01 PM By: Donnamarie Poag Entered By: Donnamarie Poag on 03/05/2021 10:34:34 Faith [name], [name] ([id number]) -------------------------------------------------------------------------------- Patient/Caregiver Education Details Patient Name: Faith Hale A. Date of Service: 03/05/2021 10:00 AM Medical Record Number: [id number] Patient Account Number: [id number] Date of Birth/Gender: [birth year] (65 y.o. F) Treating RN: Donnamarie Poag Primary Care Physician: Barney Drain Other  Clinician: Referring Physician: Barney Drain Treating Physician/Extender: Skipper Cliche in Treatment: 0 Education Assessment Education Provided To: Patient Education Topics Provided Basic Hygiene: Smoking and Wound Healing: Welcome [facility]: Wound/Skin Impairment: Electronic Signature(s) Signed: 03/05/2021 1:17:01 PM By:  Donnamarie Poag Entered By: Donnamarie Poag on 03/05/2021 11:35:38 Faith [name], [name] ([id number]) -------------------------------------------------------------------------------- Wound Assessment Details Patient Name: Faith [name], Caidance A. Date of Service: 03/05/2021 10:00 AM Medical Record Number: [id number] Patient Account Number: [id number] Date of Birth/Sex: 1955-03-16 (65 y.o. F) Treating RN: Donnamarie Poag Primary Care Jenaro Souder: Barney Drain Other Clinician: Referring Samanyu Tinnell: Barney Drain Treating Demarquez Ciolek/Extender: Jeri Cos Weeks in Treatment: 0 Wound Status Wound Number: 1 Primary Pressure Ulcer Etiology: Wound Location: Sacrum Secondary Auto-immune Wounding Event: Pressure Injury Etiology: Date Acquired: 08/03/2020 Wound Open Weeks Of Treatment: 0 Status: Clustered Wound: No Comorbid Anemia, Chronic Obstructive Pulmonary Disease (COPD), History: Coronary Artery Disease, Hypertension, Osteoarthritis Photos Wound Measurements Length: (cm) 5.7 % Reducti Width: (cm) 2.6 % Reducti Depth: (cm) 2.5 Tunneling Area: (cm) 11.64 Locati Volume: (cm) 29.099 Pos Maxi Locatio on in Area: on in Volume: : Yes on 1 ition (o'clock): 6 mum Distance: (cm) 7 n 2 Undermining: Yes Starting Position (o'clock): 12 Ending Position (o'clock): 12 Maximum Distance: (cm) 4.2 Wound Description Classification: Category/Stage IV Foul Odo Exudate Amount: Large Slough/F Exudate Type: Serosanguineous Exudate Color: red, Revard r After Cleansing: No ibrino Yes Wound Bed Granulation Amount: Medium (34-66%) Exposed Structure Granulation Quality: Red, Pink Fascia Exposed: No Necrotic Amount: Medium (34-66%) Fat Layer (Subcutaneous Tissue) Exposed: Yes Necrotic Quality: Adherent Slough Tendon Exposed: No Muscle Exposed: Yes Necrosis of Muscle: No Joint Exposed: No Bone Exposed: No Faith [name], Madelin A. ([id number]) Treatment Notes Wound #1 (Sacrum) Cleanser Normal Saline Discharge Instruction: Wash  your hands with soap and water. Remove old dressing, discard into plastic bag and place into trash. Cleanse the wound with Normal Saline prior to applying a clean dressing using gauze sponges, not tissues or cotton balls. Do not scrub or use excessive force. Pat dry using gauze sponges, not tissue or cotton balls. Wound Cleanser Discharge Instruction: Wash your hands with soap and water. Remove old dressing, discard into plastic bag and place into trash. Cleanse the wound with Wound Cleanser prior to applying a clean dressing using gauze sponges, not tissues or cotton balls. Do not scrub or use excessive force. Pat dry using gauze sponges, not tissue or cotton balls. Peri-Wound Care Topical Primary Dressing Hydrofera Blue Ready Transfer Foam, 4x5 (in/in) Discharge Instruction: CUT TO FIT TO INSERT INTO WOUND AND UNDER UNDERMINING EDGES-Apply Hydrofera Blue Ready to wound bed as directed Secondary Dressing Allevyn Adhesive Foam Sacrum Dressing, 6.75x6.75 (in/in) Discharge Instruction: or alternative border foam large dressing Secured With Compression Wrap Compression Stockings Add-Ons Electronic Signature(s) Signed: 03/05/2021 1:17:01 PM By: Donnamarie Poag Entered By: Donnamarie Poag on 03/05/2021 10:59:59 Smock, Ivan A. ([id number]) -------------------------------------------------------------------------------- [place] Details Patient Name: Faith [name], Taryne A. Date of Service: 03/05/2021 10:00 AM Medical Record Number: [id number] Patient Account Number: [id number] Date of Birth/Sex: 18-Jul-1955 (65 y.o. F) Treating RN: Donnamarie Poag Primary Care Kamee Bobst: Barney Drain Other Clinician: Referring Kamarie Veno: Barney Drain Treating Kenishia Plack/Extender: Skipper Cliche in Treatment: 0 Vital Signs Time Taken: 10:30 Temperature (F): 97.4 Height (in): 64 Pulse (bpm): 69 Source: Stated Respiratory Rate (breaths/min): 16 Weight (lbs): 96 Blood Pressure (mmHg): 106/71 Source: Stated Reference  Range: 80 - 120 mg / dl Body Mass Index (BMI): 16.5 Electronic Signature(s) Signed: 03/05/2021 1:17:01 PM By: Donnamarie Poag Entered ByDonnamarie Poag on 03/05/2021  10:35:14 °

## 2021-03-05 NOTE — Progress Notes (Signed)
Faith [name], Faith [name] ([id number]) Visit Report for 03/05/2021 Abuse/Suicide Risk Screen Details Patient Name: Faith [name], [name] Date of Service: 03/05/2021 10:00 AM Medical Record Number: [id number] Patient Account Number: [id number] Date of Birth/Sex: [birth year] (65 y.o. F) Treating RN: Donnamarie Poag Primary Care Amalie Koran: Barney Drain Other Clinician: Referring Chondra Boyde: Barney Drain Treating Roshell Brigham/Extender: Skipper Cliche in Treatment: 0 Abuse/Suicide Risk Screen Items Answer ABUSE RISK SCREEN: Has anyone close to you tried to hurt or harm you recentlyo No Do you feel uncomfortable with anyone in your familyo No Has anyone forced you do things that you didnot want to doo No Electronic Signature(s) Signed: 03/05/2021 1:17:01 PM By: Donnamarie Poag Entered By: Donnamarie Poag on 03/05/2021 10:36:55 Faith [name], [name] ([id number]) -------------------------------------------------------------------------------- Activities of Daily Living Details Patient Name: GAUBERT, Bryahna A. Date of Service: 03/05/2021 10:00 AM Medical Record Number: [id number] Patient Account Number: [id number] Date of Birth/Sex: 05-06-1955 (65 y.o. F) Treating RN: Donnamarie Poag Primary Care Essex Perry: Barney Drain Other Clinician: Referring Tabia Landowski: Barney Drain Treating Kamilla Hands/Extender: Skipper Cliche in Treatment: 0 Activities of Daily Living Items Answer Activities of Daily Living (Please select one for each item) Drive Automobile Not Able Take Medications Need Assistance Use Telephone Completely Able Care for Appearance Need Assistance Use Toilet Need Assistance Bath / Shower Need Assistance Dress Self Need Assistance Feed Self Need Assistance Walk Not Able Get In / Out Bed Need Assistance Housework Not Able Prepare Meals Not Able Handle Money Need Assistance Shop for Self Not Able Electronic Signature(s) Signed: 03/05/2021 1:17:01 PM By: Donnamarie Poag Entered By: Donnamarie Poag on 03/05/2021 10:37:44 Faith [name], Faith AMarland Kitchen  ([id number]) -------------------------------------------------------------------------------- Education Screening Details Patient Name: Faith [name], [name] Date of Service: 03/05/2021 10:00 AM Medical Record Number: [id number] Patient Account Number: [id number] Date of Birth/Sex: March 22, 1955 (65 y.o. F) Treating RN: Donnamarie Poag Primary Care Orly Quimby: Barney Drain Other Clinician: Referring Oriel Ojo: Barney Drain Treating Wilmer Berryhill/Extender: Skipper Cliche in Treatment: 0 Primary Learner Assessed: Patient Learning Preferences/Education Level/Primary Language Learning Preference: Explanation Highest Education Level: High School Preferred Language: English Cognitive Barrier Language Barrier: No Translator Needed: No Memory Deficit: No Emotional Barrier: No Cultural/Religious Beliefs Affecting Medical Care: No Physical Barrier Impaired Vision: No Impaired Hearing: No Decreased Hand dexterity: Yes Limitations: MS Knowledge/Comprehension Knowledge Level: High Comprehension Level: High Ability to understand written instructions: High Ability to understand verbal instructions: High Motivation Anxiety Level: Calm Cooperation: Cooperative Education Importance: Acknowledges Need Interest in Health Problems: Asks Questions Perception: Coherent Willingness to Engage in Self-Management High Activities: Readiness to Engage in Self-Management High Activities: Electronic Signature(s) Signed: 03/05/2021 1:17:01 PM By: Donnamarie Poag Entered By: Donnamarie Poag on 03/05/2021 10:38:19 Faith [name], Faith [name] ([id number]) -------------------------------------------------------------------------------- Fall Risk Assessment Details Patient Name: Faith [name], [name] Date of Service: 03/05/2021 10:00 AM Medical Record Number: [id number] Patient Account Number: [id number] Date of Birth/Sex: 1955/11/18 (65 y.o. F) Treating RN: Donnamarie Poag Primary Care Thania Woodlief: Barney Drain Other Clinician: Referring Antonious Omahoney:  Barney Drain Treating Guss Farruggia/Extender: Skipper Cliche in Treatment: 0 Fall Risk Assessment Items Have you had 2 or more falls in the last 12 monthso 0 No Have you had any fall that resulted in injury in the last 12 monthso 0 No FALLS RISK SCREEN History of falling - immediate or within 3 months 0 No Secondary diagnosis (Do you have 2 or more medical diagnoseso) 0 No Ambulatory aid None/bed rest/wheelchair/nurse 0 Yes Crutches/cane/walker 0 No Furniture 0 No Intravenous therapy Access/Saline/Heparin Lock 0 No Gait/Transferring Normal/ bed rest/ wheelchair 0 Yes Weak (short steps with or without  shuffle, stooped but able to lift head while walking, may 0 No seek support from furniture) Impaired (short steps with shuffle, may have difficulty arising from chair, head down, impaired 0 No balance) Mental Status Oriented to own ability 0 Yes Electronic Signature(s) Signed: 03/05/2021 1:17:01 PM By: Donnamarie Poag Entered By: Donnamarie Poag on 03/05/2021 10:45:00 Faith [name], [name] ([id number]) -------------------------------------------------------------------------------- Foot Assessment Details Patient Name: Faith [name], [name] Date of Service: 03/05/2021 10:00 AM Medical Record Number: [id number] Patient Account Number: [id number] Date of Birth/Sex: Apr 21, 1955 (65 y.o. F) Treating RN: Donnamarie Poag Primary Care Lewellyn Fultz: Barney Drain Other Clinician: Referring Neo Yepiz: Barney Drain Treating Diannia Hogenson/Extender: Skipper Cliche in Treatment: 0 Foot Assessment Items Site Locations + = Sensation present, - = Sensation absent, C = Callus, U = Ulcer R = Redness, W = Warmth, M = Maceration, PU = Pre-ulcerative lesion F = Fissure, S = Swelling, D = Dryness Assessment Right: Left: Other Deformity: No No Prior Foot Ulcer: No No Prior Amputation: No No Charcot Joint: No No Ambulatory Status: Non-ambulatory Assistance Device: Wheelchair Gait: Electronic Signature(s) Signed: 03/05/2021  1:17:01 PM By: Donnamarie Poag Entered By: Donnamarie Poag on 03/05/2021 10:44:31 Faith [name], [name] ([id number]) -------------------------------------------------------------------------------- Nutrition Risk Screening Details Patient Name: Faith [name], [name] Date of Service: 03/05/2021 10:00 AM Medical Record Number: [id number] Patient Account Number: [id number] Date of Birth/Sex: 01-09-1956 (65 y.o. F) Treating RN: Donnamarie Poag Primary Care Talisa Petrak: Barney Drain Other Clinician: Referring Brendon Christoffel: Barney Drain Treating Husein Guedes/Extender: Jeri Cos Weeks in Treatment: 0 Height (in): 64 Weight (lbs): 96 Body Mass Index (BMI): 16.5 Nutrition Risk Screening Items Score Screening NUTRITION RISK SCREEN: I have an illness or condition that made me change the kind and/or amount of food I eat 0 No I eat fewer than two meals per day 0 No I eat few fruits and vegetables, or milk products 0 No I have three or more drinks of beer, liquor or wine almost every day 0 No I have tooth or mouth problems that make it hard for me to eat 0 No I don't always have enough money to buy the food I need 0 No I eat alone most of the time 0 No I take three or more different prescribed or over-the-counter drugs a day 1 Yes Without wanting to, I have lost or gained 10 pounds in the last six months 0 No I am not always physically able to shop, cook and/or feed myself 2 Yes Nutrition Protocols Good Risk Protocol 0 No interventions needed Moderate Risk Protocol High Risk Proctocol Risk Level: Moderate Risk Score: 3 Electronic Signature(s) Signed: 03/05/2021 1:17:01 PM By: Donnamarie Poag Entered By: Donnamarie Poag on 03/05/2021 10:43:59

## 2021-03-06 NOTE — Progress Notes (Addendum)
BREELEY, BISCHOF ([id number]) Visit Report for 03/05/2021 Chief Complaint Document Details Patient Name: Faith [name], Faith [name]. Date of Service: 03/05/2021 10:00 AM Medical Record Number: [id number] Patient Account Number: [id number] Date of Birth/Sex: 1955-08-06 (65 y.o. F) Treating RN: Faith [name] Primary Care Provider: Barney [name] Other Clinician: Referring Provider: Barney [name] Treating Provider/Extender: Faith [name] in Treatment: 0 Information Obtained from: Patient Chief Complaint Sacral pressure ulcer Electronic Signature(s) Signed: 03/05/2021 11:04:34 AM By: Worthy Keeler PA-C Entered By: Worthy [name] on 03/05/2021 11:04:34 Faith [name], Faith [name] Faith [name] ([id number]) -------------------------------------------------------------------------------- HPI Details Patient Name: Faith [name]. Date of Service: 03/05/2021 10:00 AM Medical Record Number: [id number] Patient Account Number: [id number] Date of Birth/Sex: 03-Jun-1955 (65 y.o. 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 This actually began somewhere around August 03, 2020 according to what she tells me. She tells me she was in any pending hospital when it started. In December she was in the hospital as well for [name] short stent of time before she went home she was in [name] skilled nursing facility as well it sounds like for [name] bit. [facility] and [facility]. Currently she tells me that they have been doing some of the dressing changes and keeping it covered with just [name] border foam dressing at times. She is also had this open to air sometimes so that it can "get  air". Nonetheless I explained to her today that we definitely do not want it to just be left open to air that can actually be one of the worst things at this point. Nonetheless she does have [name] significant wound here and this is can be [name] very difficult wound to heal. The patient does have [name] history of multiple sclerosis, hypertension, COPD, and she does smoke cigarettes 3/day she tells [place]. Electronic Signature(s) Signed: 03/06/2021 5:09:25 PM By: Worthy Keeler PA-C Entered By: Worthy [name] on 03/06/2021 17:09:24 Faith [name] ([id number]) -------------------------------------------------------------------------------- Physical Exam Details Patient Name: Faith [name], Faith [name]. Date of Service: 03/05/2021 10:00 AM Medical Record Number: [id number] Patient Account Number: [id number] Date of Birth/Sex: [birth year] (65 y.o. F) Treating RN: Faith [name] Primary Care Provider: Barney [name] Other Clinician: Referring Provider: Barney [name] Treating Provider/Extender: Faith [name] in Treatment: 0 Constitutional sitting or standing blood pressure is within target range for patient.. pulse regular and within target range for patient.Marland Faith [name] respirations regular, non- labored and within target range for patient.Marland Faith [name] temperature within target range for patient.. Well-nourished and well-hydrated in no acute distress. Eyes conjunctiva clear no eyelid edema noted. pupils equal round and reactive to light and accommodation. Ears, Nose, Mouth, and Throat no gross abnormality of ear auricles or external auditory canals. normal hearing noted during conversation. mucus membranes moist. Respiratory normal breathing without difficulty. Musculoskeletal Patient unable to walk without assistance. Psychiatric this patient is able to make decisions and demonstrates good insight into disease process. Alert and Oriented x 3. pleasant and cooperative. Notes Upon inspection patient's wound bed actually showed signs of having  extremely loose skin around the gluteal region which is only on to complicate this area as far as healing is concerned. She does have significant undermining as well which also  is going to be [name] problem here. The actual wound size internally is significantly larger than the external would reveal. She also is generally weak due to multiple sclerosis and therefore does not have the easiest time trying to offload she is able to hold herself up for dressing changes holding onto [name] bar [facility] her at [facility]. Nonetheless in general she is not able to just get up and ambulate which is going to be problematic to try to keep pressure off I do believe she had some ability however utilizing her chair as well have some offloading positions that she can get in. Electronic Signature(s) Signed: 03/06/2021 5:10:30 PM By: Worthy Keeler PA-C Entered By: Worthy [name] on 03/06/2021 17:10:30 Faith [name], Faith [name] Faith [name] ([id number]) -------------------------------------------------------------------------------- Physician Orders Details Patient Name: Faith [name]. Date of Service: 03/05/2021 10:00 AM Medical Record Number: [id number] Patient Account Number: [id number] Date of Birth/Sex: 1955-03-16 (65 y.o. F) Treating RN: Faith [name] Primary Care Provider: Barney [name] Other Clinician: Referring Provider: Barney [name] Treating Provider/Extender: Faith [name] in Treatment: 0 Verbal / Phone Orders: No Diagnosis Coding ICD-10 Coding Code Description L89.154 Pressure ulcer of sacral region, stage 4 G35 Multiple sclerosis I10 Essential (primary) hypertension J44.9 Chronic obstructive pulmonary disease, unspecified F17.218 Nicotine dependence, cigarettes, with other nicotine-induced disorders Follow-up Appointments o Return Appointment in 2 weeks. [facility] Orders/Instructions: - [facility] to [facility] and order xrays and supplies [facility]DERS TO [facility] @[facility]  phone [phone]; fax  [phone] Bathing/ Shower/ Hygiene o Clean wound with Normal Saline or wound cleanser. o No tub bath. Anesthetic (Use 'Patient Medications' Section for Anesthetic Order Entry) o Lidocaine applied to wound bed Off-Loading o Roho cushion for wheelchair o Hospital bed/mattress o Turn and reposition every 2 hours Additional Orders / Instructions o Follow Nutritious Diet and Increase Protein Intake Wound Treatment Wound #1 - Sacrum Cleanser: Normal Saline 3 x Per Week/15 Days Discharge Instructions: Wash your hands with soap and water. Remove old dressing, discard into plastic bag and place into trash. Cleanse the wound with Normal Saline prior to applying [name] clean dressing using gauze sponges, not tissues or cotton balls. Do not scrub or use excessive force. Pat dry using gauze sponges, not tissue or cotton balls. Cleanser: Wound Cleanser 3 x Per Week/15 Days Discharge Instructions: Wash your hands with soap and water. Remove old dressing, discard into plastic bag and place into trash. Cleanse the wound with Wound Cleanser prior to applying [name] clean dressing using gauze sponges, not tissues or cotton balls. Do not scrub or use excessive force. Pat dry using gauze sponges, not tissue or cotton balls. Primary Dressing: Hydrofera Blue Ready Transfer Foam, 4x5 (in/in) 3 x Per Week/15 Days Discharge Instructions: CUT TO FIT TO INSERT INTO WOUND AND UNDER UNDERMINING EDGES-Apply Hydrofera Blue Ready to wound bed as directed Secondary Dressing: Allevyn Adhesive Foam Sacrum Dressing, 6.75x6.75 (in/in) 3 x Per Week/15 Days Discharge Instructions: or alternative border foam large dressing Radiology o Saccrum And Coccyx - non healing wound of sacrum - (ICD10 L89.154 - Pressure ulcer of sacral region, stage 4) Faith [name], Faith [name]. ([id number]) Electronic Signature(s) Signed: 03/31/2021 5:15:51 PM By: Worthy Keeler PA-C Signed: 04/08/2021 4:26:39 PM By: Faith [name] Previous Signature:  03/05/2021 1:17:01 PM Version By: Faith [name] Previous Signature: 03/06/2021 5:21:10 PM Version By: Worthy Keeler PA-C Entered By: Faith [name] on 03/12/2021 10:58:58 Faith [name], Faith [name] Faith [name] ([id number]) -------------------------------------------------------------------------------- Prescription 03/05/2021 Patient Name: Faith [name] Provider: Jeri Cos PA-C Date of Birth: Dec 18, 1955  NPI#: [id number] Sex: F DEA#: [id number] Phone #: [phone] License #: Patient Address: [address] [facility] [place], [place] [address] [address], [place], [place] [address] [phone] Allergies penicillin; Darvon Provider's Orders o Saccrum And Coccyx - ICD10: L89.154 - non healing wound of sacrum Hand Signature: Date(s): Electronic Signature(s) Signed: 03/31/2021 5:15:51 PM By: Worthy Keeler PA-C Signed: 04/08/2021 4:26:39 PM By: Faith [name] Previous Signature: 03/05/2021 1:17:01 PM Version By: Faith [name] Previous Signature: 03/06/2021 5:21:10 PM Version By: Worthy Keeler PA-C Entered By: Faith [name] on 03/12/2021 10:58:59 Faith [name], Faith [name]. ([id number]) --------------------------------------------------------------------------------  Problem List Details Patient Name: Faith [name], Sebrena [name]. Date of Service: 03/05/2021 10:00 AM Medical Record Number: [id number] Patient Account Number: [id number] Date of Birth/Sex: August 03, 1955 (65 y.o. F) Treating RN: Faith [name] Primary Care Provider: Barney [name] Other Clinician: Referring Provider: Barney [name] Treating Provider/Extender: Jeri Cos Weeks in Treatment: 0 Active Problems ICD-10 Encounter Code Description Active Date MDM Diagnosis L89.154 Pressure ulcer of sacral region, stage 4 03/05/2021 No Yes G35 Multiple sclerosis 03/05/2021 No Yes I10 Essential (primary) hypertension 03/05/2021 No Yes J44.9 Chronic obstructive pulmonary disease, unspecified 03/05/2021 No Yes F17.218  Nicotine dependence, cigarettes, with other nicotine-induced disorders 03/05/2021 No Yes Inactive Problems Resolved Problems Electronic Signature(s) Signed: 03/05/2021 11:04:15 AM By: Worthy Keeler PA-C Entered By: Worthy [name] on 03/05/2021 11:04:15 Mustard, Jaylen [name]. ([id number]) -------------------------------------------------------------------------------- Progress Note Details Patient Name: Faith [name], Kerina [name]. Date of Service: 03/05/2021 10:00 AM Medical Record Number: [id number] Patient Account Number: [id number] Date of Birth/Sex: 14-Feb-1956 (65 y.o. 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 This actually began somewhere around August 03, 2020 according to what she tells me. She tells me she was in any pending hospital when it started. In December she was in the hospital as well for [name] short stent of time before she went home she was in [name] skilled nursing facility as well it sounds like for [name] bit. [facility] and [facility] her [facility] changes [facility] to [facility]. Currently she tells me that they have been doing some of the dressing changes and keeping it covered with just [name] border foam dressing at times. She is also had this open to air sometimes so that it can "get air". Nonetheless I explained to her today that we definitely do not want it to just be left open to air that can actually be one of the worst things at this point. Nonetheless she does have [name] significant wound here and this is can be [name] very difficult wound to heal. The patient does have [name] history of multiple  sclerosis, hypertension, COPD, and she does smoke cigarettes 3/day she tells [place]. Patient History Information obtained from Patient. Allergies penicillin (Severity: Moderate), Darvon (Severity: Moderate) Social History Current every day smoker - 3 per day, Marital Status - Separated, Alcohol Use - Never, Drug Use - No History, Caffeine Use - Daily. Medical History Hematologic/Lymphatic Patient has history of Anemia Respiratory Patient has history of Chronic Obstructive Pulmonary Disease (COPD) Cardiovascular Patient has history of Coronary Artery Disease, Hypertension Musculoskeletal Patient has history of  Osteoarthritis Neurologic Denies history of Seizure Disorder Review of Systems (ROS) Constitutional Symptoms (General Health) Denies complaints or symptoms of Fatigue, Fever, Chills, Marked Weight Change. Eyes Complains or has symptoms of Vision Changes, poor vision Ear/Nose/Mouth/Throat Denies complaints or symptoms of Difficult clearing ears, Sinusitis. Respiratory Denies complaints or symptoms of Chronic or frequent coughs, Shortness of Breath, emphysema Gastrointestinal Denies complaints or symptoms of Frequent diarrhea, Nausea, Vomiting. Endocrine Denies complaints or symptoms of Hepatitis, Thyroid disease, Polydypsia (Excessive Thirst). Genitourinary Denies complaints or symptoms of Kidney failure/ Dialysis, Incontinence/dribbling. Immunological Denies complaints or symptoms of Hives, Itching. Integumentary (Skin) Denies complaints or symptoms of Wounds, Bleeding or bruising tendency, Breakdown, Swelling. Musculoskeletal Complains or has symptoms of Muscle Weakness. 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 Faith [name], Faith [name] ([id number]) Objective Constitutional sitting or standing blood pressure is within target range for patient.. pulse regular  and within target range for patient.Marland Faith [name] respirations regular, non- labored and within target range for patient.Marland Faith [name] temperature within target range for patient.. Well-nourished and well-hydrated in no acute distress. Vitals Time Taken: 10:30 AM, Height: 64 in, Source: Stated, Weight: 96 lbs, Source: Stated, BMI: 16.5, Temperature: 97.4 F, Pulse: 69 bpm, Respiratory Rate: 16 breaths/min, Blood Pressure: 106/71 mmHg. Eyes conjunctiva clear no eyelid edema noted. pupils equal round and reactive to light and accommodation. Ears, Nose, Mouth, and Throat no gross abnormality of ear auricles or external auditory canals. normal hearing noted during conversation. mucus membranes moist. Respiratory normal breathing without difficulty. Musculoskeletal Patient unable to walk without assistance. Psychiatric this patient is able to make decisions and demonstrates good insight into disease process. Alert and Oriented x 3. pleasant and cooperative. General Notes: Upon inspection patient's wound bed actually showed signs of having extremely loose skin around the gluteal region which is only on to complicate this area as far as healing is concerned. She does have significant undermining as well which also is going to be [name] problem here. The actual wound size internally is significantly larger than the external would reveal. She also is generally weak due to multiple sclerosis and therefore does not have the easiest time trying to offload she is able to hold herself up for dressing changes holding onto [name] bar above her at [facility]. Nonetheless in general she is not able to just get up and ambulate which is going to be problematic to try to keep pressure off I do believe she had some ability however utilizing her chair as well have some offloading positions that she can get in. Integumentary (Hair, Skin) Wound #1 status is Open. Original cause of wound was Pressure Injury. The date acquired was: 08/03/2020. The wound is  located on the Sacrum. The wound measures 5.7cm length x 2.6cm width x 2.5cm depth; 11.64cm^2 area and 29.099cm^3 volume. There is muscle and Fat Layer (Subcutaneous Tissue) exposed. Tunneling has been noted at 6:00 with [name] maximum distance of 7cm. There is additional tunneling and at :00. Undermining begins at 12:00 and ends at 12:00 with [name] maximum distance of 4.2cm. There is [name] large amount of serosanguineous drainage noted. There is medium (34-66%) red, pink granulation within the wound bed. There is [name] medium (34-66%) amount of necrotic tissue within the wound bed including Adherent Slough. Assessment Active Problems ICD-10 Pressure ulcer of sacral region, stage 4 Multiple sclerosis Essential (primary) hypertension Chronic obstructive pulmonary disease, unspecified Nicotine dependence, cigarettes, with other nicotine-induced disorders Plan Follow-up Appointments: Return Appointment in  2 weeks. Home Health: Other Home Health Orders/Instructions: - [facility] and order xrays and supplies [facility]DERS TO [facility] @[phone] Bathing/ Shower/ Hygiene: Faith [name], Faith [name] ([id number]) Clean wound with Normal Saline or wound cleanser. No tub bath. Anesthetic (Use 'Patient Medications' Section for Anesthetic Order Entry): Lidocaine applied to wound bed Off-Loading: Roho cushion for wheelchair Hospital bed/mattress Turn and reposition every 2 hours Additional Orders / Instructions: Follow Nutritious Diet and Increase Protein Intake Radiology ordered were: Saccrum And Coccyx - non healing wound of sacrum WOUND #1: - Sacrum Wound Laterality: Cleanser: Normal Saline 3 x Per Week/15 Days Discharge Instructions: Wash your hands with soap and water. Remove old dressing, discard into plastic bag and place into trash. Cleanse the wound with Normal Saline prior to applying [name] clean dressing using gauze sponges, not tissues or cotton balls. Do not scrub or use excessive force. Pat dry using gauze  sponges, not tissue or cotton balls. Cleanser: Wound Cleanser 3 x Per Week/15 Days Discharge Instructions: Wash your hands with soap and water. Remove old dressing, discard into plastic bag and place into trash. Cleanse the wound with Wound Cleanser prior to applying [name] clean dressing using gauze sponges, not tissues or cotton balls. Do not scrub or use excessive force. Pat dry using gauze sponges, not tissue or cotton balls. Primary Dressing: Hydrofera Blue Ready Transfer Foam, 4x5 (in/in) 3 x Per Week/15 Days Discharge Instructions: CUT TO FIT TO INSERT INTO WOUND AND UNDER UNDERMINING EDGES-Apply Hydrofera Blue Ready to wound bed as directed Secondary Dressing: Allevyn Adhesive Foam Sacrum Dressing, 6.75x6.75 (in/in) 3 x Per Week/15 Days Discharge Instructions: or alternative border foam large dressing 1. I would recommend at this point aggressive offloading which I think is can to be optimal thing for her to focus on at this point I discussed that with her today. This would include when she is in the bed, when she is in the chair at home, and when she is in her motorized wheelchair. 2. I am also going to recommend that she continue to have patient change her dressings will be using Hydrofera Blue ready. I would suggest this be cut to roughly the internal size and cut down and then subsequently the external dry dressing applied. 3. I do think she needs to have this changed 3 times per week and depending on the drainage this may need to be more often. We will see patient back for reevaluation in 2 weeks here in the clinic. If anything worsens or changes patient will contact our office for additional recommendations. Electronic Signature(s) Signed: 03/06/2021 5:11:45 PM By: Worthy Keeler PA-C Entered By: Worthy [name] on 03/06/2021 17:11:45 Faith [name], Faith [name] Faith [name] ([id number]) -------------------------------------------------------------------------------- ROS/PFSH Details Patient Name: Faith Hale  [name]. Date of Service: 03/05/2021 10:00 AM Medical Record Number: [id number] Patient Account Number: [id number] Date of Birth/Sex: 1955-08-22 (65 y.o. F) Treating RN: Faith [name] Primary Care Provider: Barney [name] Other Clinician: Referring Provider: Barney [name] Treating Provider/Extender: Faith [name] in Treatment: 0 Information Obtained From Patient Constitutional Symptoms (General Health) Complaints and Symptoms: Negative for: Fatigue; Fever; Chills; Marked Weight Change Eyes Complaints and Symptoms: Positive for: Vision Changes Review of System Notes: poor vision Ear/Nose/Mouth/Throat Complaints and Symptoms: Negative for: Difficult clearing ears; Sinusitis Respiratory Complaints and Symptoms: Negative for: Chronic or frequent coughs; Shortness of Breath Review of System Notes: emphysema Medical History: Positive for: Chronic Obstructive Pulmonary Disease (COPD) Gastrointestinal Complaints and Symptoms: Negative for: Frequent diarrhea; Nausea; Vomiting Endocrine Complaints and Symptoms: Negative  for: Hepatitis; Thyroid disease; Polydypsia (Excessive Thirst) Genitourinary Complaints and Symptoms: Negative for: Kidney failure/ Dialysis; Incontinence/dribbling Immunological Complaints and Symptoms: Negative for: Hives; Itching Integumentary (Skin) Complaints and Symptoms: Negative for: Wounds; Bleeding or bruising tendency; Breakdown; Swelling Musculoskeletal Complaints and Symptoms: Positive for: Muscle Weakness Faith [name], Faith [name]. 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 ([id number]) -------------------------------------------------------------------------------- SuperBill Details Patient Name: Faith [name], Rylinn [name]. Date of Service: 03/05/2021 Medical Record Number: [id number] Patient Account Number: [id number] Date of Birth/Sex: [birth year] (66 y.o. F) Treating RN: Faith [name] Primary Care Provider: Barney [name] Other Clinician: Referring Provider: Barney [name] Treating Provider/Extender: Faith [name] in Treatment: 0 Diagnosis Coding ICD-10 Codes Code Description [phone] Pressure ulcer of sacral region, stage 4 G35 Multiple sclerosis I10 Essential (primary) hypertension J44.9 Chronic obstructive pulmonary disease, unspecified F17.218 Nicotine dependence, cigarettes, with other nicotine-induced disorders Facility Procedures CPT4 Code: [id number] Description: 99214 - WOUND CARE VISIT-LEV 4 EST PT Modifier: Quantity: 1 Physician Procedures CPT4 Code: [id number] Description: 28786 - WC PHYS LEVEL 4 - NEW PT Modifier: Quantity: 1 CPT4 Code: Description: ICD-10 Diagnosis Description L89.154 Pressure ulcer of sacral region, stage 4 G35 Multiple sclerosis I10 Essential (primary) hypertension J44.9 Chronic obstructive pulmonary disease, unspecified Modifier: Quantity: Electronic Signature(s) Signed: 03/06/2021 5:18:36 PM By: Worthy Keeler PA-C Previous Signature: 03/05/2021 1:17:01 PM Version By: Faith [name] Entered By: Worthy [name] on 03/06/2021 17:18:36

## 2021-03-12 ENCOUNTER — Ambulatory Visit: Payer: Medicaid Other

## 2021-03-17 ENCOUNTER — Encounter (HOSPITAL_COMMUNITY): Payer: Self-pay

## 2021-03-17 ENCOUNTER — Ambulatory Visit (HOSPITAL_COMMUNITY): Admission: RE | Admit: 2021-03-17 | Payer: Medicare Other | Source: Ambulatory Visit

## 2021-03-18 ENCOUNTER — Ambulatory Visit
Admission: RE | Admit: 2021-03-18 | Discharge: 2021-03-18 | Disposition: A | Discharge status: Home / Self Care | Payer: Medicare Other | Source: Ambulatory Visit | Attending: Hospitalist | Admitting: Hospitalist

## 2021-03-18 ENCOUNTER — Other Ambulatory Visit: Payer: Self-pay | Admitting: Hospitalist

## 2021-03-18 DIAGNOSIS — L8944 Pressure ulcer of contiguous site of back, buttock and hip, stage 4: Secondary | ICD-10-CM

## 2021-03-18 DIAGNOSIS — M8668 Other chronic osteomyelitis, other site: Secondary | ICD-10-CM

## 2021-03-19 ENCOUNTER — Other Ambulatory Visit: Payer: Self-pay

## 2021-03-19 DIAGNOSIS — L89154 Pressure ulcer of sacral region, stage 4: Secondary | ICD-10-CM | POA: Diagnosis not present

## 2021-03-19 NOTE — Progress Notes (Signed)
AYODELE, HARTSOCK ([id number]) Visit Report for 03/19/2021 Physician Orders Details Patient Name: Faith [name], [name] Date of Service: 03/19/2021 2:30 PM Medical Record Number: [id number] Patient Account Number: [id number] Date of Birth/Sex: 04/15/1955 (66 y.o. F) Treating RN: Donnamarie Poag Primary Care Provider: Barney Drain Other Clinician: Referring Provider: Barney Drain Treating Provider/Extender: Skipper Cliche in Treatment: 2 Verbal / Phone Orders: No Diagnosis Coding Follow-up Appointments o Return Appointment in 2 weeks. o Nurse Visit as needed [place] Orders/Instructions: - [facility] to do dressings and order xrays and supplies [facility]X ORDERS TO [facility]  phone [phone]; fax [phone] Bathing/ Shower/ Hygiene o Clean wound with Normal Saline or wound cleanser. o No tub bath. Anesthetic (Use 'Patient Medications' Section for Anesthetic Order Entry) o Lidocaine applied to wound bed Off-Loading o Roho cushion for wheelchair o Hospital bed/mattress o Turn and reposition every 2 hours o Other: - [facility]----MAKE SURE TO CUT THE HYDROFERA BLUE FOAM TO FIT INSIDE THE WOUND and not float on top of the wound--order the larger ready foam to use and send home with pt Additional Orders / Instructions o Follow Nutritious Diet and Increase Protein Intake Wound Treatment Wound #1 - Sacrum Cleanser: Normal Saline 3 x Per Week/15 Days Discharge Instructions: Wash your hands with soap and water. Remove old dressing, discard into plastic bag and place into trash. Cleanse the wound with Normal Saline prior to applying a clean dressing using gauze sponges, not tissues or cotton balls. Do not scrub or use excessive force. Pat dry using gauze sponges, not tissue or cotton balls. Cleanser: Wound Cleanser 3 x Per Week/15 Days Discharge Instructions: Wash your hands with soap and water. Remove old dressing, discard into plastic bag and place into  trash. Cleanse the wound with Wound Cleanser prior to applying a clean dressing using gauze sponges, not tissues or cotton balls. Do not scrub or use excessive force. Pat dry using gauze sponges, not tissue or cotton balls. Primary Dressing: Hydrofera Blue Ready Transfer Foam, 4x5 (in/in) 3 x Per Week/15 Days Discharge Instructions: CUT TO FIT TO INSERT INTO WOUND AND UNDER UNDERMINING EDGES-Apply Hydrofera Blue Ready to wound bed as directed Secondary Dressing: Allevyn Adhesive Foam Sacrum Dressing, 6.75x6.75 (in/in) 3 x Per Week/15 Days Discharge Instructions: or alternative border foam large dressing Electronic Signature(s) Signed: 03/19/2021 2:08:42 PM By: Worthy Keeler PA-C Signed: 03/19/2021 2:32:27 PM By: Donnamarie Poag Entered By: Donnamarie Poag on 03/19/2021 13:07:31 Castrillon, Sharona A. ([id number]) -------------------------------------------------------------------------------- SuperBill Details Patient Name: Faith Shark, [name] Date of Service: 03/19/2021 Medical Record Number: [id number] Patient Account Number: [id number] Date of Birth/Sex: 1955/05/22 (66 y.o. F) Treating RN: Donnamarie Poag Primary Care Provider: Barney Drain Other Clinician: Referring Provider: Barney Drain Treating Provider/Extender: Skipper Cliche in Treatment: 2 Diagnosis Coding ICD-10 Codes Code Description [phone] Pressure ulcer of sacral region, stage 4 G35 Multiple sclerosis I10 Essential (primary) hypertension J44.9 Chronic obstructive pulmonary disease, unspecified F17.218 Nicotine dependence, cigarettes, with other nicotine-induced disorders Facility Procedures CPT4 Code: 47425956 Description: 38756 - WOUND CARE VISIT-LEV 3 EST PT Modifier: Quantity: 1 Electronic Signature(s) Signed: 03/19/2021 2:08:42 PM By: Worthy Keeler PA-C Signed: 03/19/2021 2:32:27 PM By: Donnamarie Poag Entered ByDonnamarie Poag on 03/19/2021 13:12:54

## 2021-03-19 NOTE — Progress Notes (Signed)
Faith, [name] ([id number]) Visit Report for 03/19/2021 Arrival Information Details Patient Name: Faith, [name] Date of Service: 03/19/2021 2:30 PM Medical Record Number: [id number] Patient Account Number: [id number] Date of Birth/Sex: 08-07-1955 (65 y.o. F) Treating RN: Donnamarie Poag Primary Care Leroy Trim: Barney Drain Other Clinician: Referring Talley Casco: Barney Drain Treating Shya Kovatch/Extender: Skipper Cliche in Treatment: 2 Visit Information History Since Last Visit Added or deleted any medications: No Patient Arrived: Wheel Chair Had a fall or experienced change in No Arrival Time: 12:55 activities of daily living that may affect Accompanied By: self risk of falls: Transfer Assistance: EasyPivot Patient Hospitalized since last visit: No Lift Has Dressing in Place as Prescribed: Yes Patient Identification Verified: Yes Pain Present Now: No Secondary Verification Process Completed: Yes Patient Requires Transmission-Based No Precautions: Patient Has Alerts: Yes Patient Alerts: NOT diabetic [facility]ax to [facility] Electronic Signature(s) Signed: 03/19/2021 2:32:27 PM By: Donnamarie Poag Entered By: Donnamarie Poag on 03/19/2021 12:55:30 [name] ([id number]) -------------------------------------------------------------------------------- Clinic Level of Care Assessment Details Patient Name: [name], [name] Date of Service: 03/19/2021 2:30 PM Medical Record Number: [id number] Patient Account Number: [id number] Date of Birth/Sex: 12/29/1955 (65 y.o. F) Treating RN: Donnamarie Poag Primary Care Kymiah Araiza: Barney Drain Other Clinician: Referring Zamar Odwyer: Barney Drain Treating Nashali Ditmer/Extender: Skipper Cliche in Treatment: 2 Clinic Level of Care Assessment Items TOOL 4 Quantity Score []  - Use when only an EandM is performed on FOLLOW-UP visit 0 ASSESSMENTS - Nursing Assessment / Reassessment []  - Reassessment of Co-morbidities (includes updates in patient status) 0 []  -  0 Reassessment of Adherence to Treatment Plan ASSESSMENTS - Wound and Skin Assessment / Reassessment X - Simple Wound Assessment / Reassessment - one wound 1 5 []  - 0 Complex Wound Assessment / Reassessment - multiple wounds []  - 0 Dermatologic / Skin Assessment (not related to wound area) ASSESSMENTS - Focused Assessment []  - Circumferential Edema Measurements - multi extremities 0 []  - 0 Nutritional Assessment / Counseling / Intervention []  - 0 Lower Extremity Assessment (monofilament, tuning fork, pulses) []  - 0 Peripheral Arterial Disease Assessment (using hand held doppler) ASSESSMENTS - Ostomy and/or Continence Assessment and Care []  - Incontinence Assessment and Management 0 []  - 0 Ostomy Care Assessment and Management (repouching, etc.) PROCESS - Coordination of Care X - Simple Patient / Family Education for ongoing care 1 15 []  - 0 Complex (extensive) Patient / Family Education for ongoing care []  - 0 Staff obtains Programmer, systems, Records, Test Results / Process Orders []  - 0 Staff telephones HHA, Nursing Homes / Clarify orders / etc []  - 0 Routine Transfer to another Facility (non-emergent condition) []  - 0 Routine Hospital Admission (non-emergent condition) []  - 0 New Admissions / Biomedical engineer / Ordering NPWT, Apligraf, etc. []  - 0 Emergency Hospital Admission (emergent condition) X- 1 10 Simple Discharge Coordination []  - 0 Complex (extensive) Discharge Coordination PROCESS - Special Needs []  - Pediatric / Minor Patient Management 0 []  - 0 Isolation Patient Management []  - 0 Hearing / Language / Visual special needs X- 1 15 Assessment of Community assistance (transportation, D/C planning, etc.) []  - 0 Additional assistance / Altered mentation X- 1 15 Support Surface(s) Assessment (bed, cushion, seat, etc.) INTERVENTIONS - Wound Cleansing / Measurement [name], [name] ([id number]) X- 1 5 Simple Wound Cleansing - one wound []  - 0 Complex Wound  Cleansing - multiple wounds []  - 0 Wound Imaging (photographs - any number of wounds) []  - 0 Wound Tracing (instead of photographs) X- 1 5 Simple  Wound Measurement - one wound []  - 0 Complex Wound Measurement - multiple wounds INTERVENTIONS - Wound Dressings []  - Small Wound Dressing one or multiple wounds 0 []  - 0 Medium Wound Dressing one or multiple wounds X- 1 20 Large Wound Dressing one or multiple wounds []  - 0 Application of Medications - topical []  - 0 Application of Medications - injection INTERVENTIONS - Miscellaneous []  - External ear exam 0 []  - 0 Specimen Collection (cultures, biopsies, blood, body fluids, etc.) []  - 0 Specimen(s) / Culture(s) sent or taken to Lab for analysis []  - 0 Patient Transfer (multiple staff / Civil Service fast streamer / Similar devices) []  - 0 Simple Staple / Suture removal (25 or less) []  - 0 Complex Staple / Suture removal (26 or more) []  - 0 Hypo / Hyperglycemic Management (close monitor of Blood Glucose) []  - 0 Ankle / Brachial Index (ABI) - do not check if billed separately []  - 0 Vital Signs Has the patient been seen at the hospital within the last three years: Yes Total Score: 90 Level Of Care: New/Established - Level 3 Electronic Signature(s) Signed: 03/19/2021 2:32:27 PM By: Donnamarie Poag Entered By: Donnamarie Poag on 03/19/2021 13:12:47 [name] ([id number]) -------------------------------------------------------------------------------- Encounter Discharge Information Details Patient Name: Faith Shark, [name] Date of Service: 03/19/2021 2:30 PM Medical Record Number: [id number] Patient Account Number: [id number] Date of Birth/Sex: [birth year] (65 y.o. F) Treating RN: Donnamarie Poag Primary Care Quetzally Callas: Barney Drain Other Clinician: Referring Ashlei Chinchilla: Barney Drain Treating Ovadia Lopp/Extender: Skipper Cliche in Treatment: 2 Encounter Discharge Information Items Discharge Condition: Stable Ambulatory Status: Wheelchair Discharge  Destination: Home Transportation: Other Accompanied By: self Schedule Follow-up Appointment: Yes Clinical Summary of Care: Electronic Signature(s) Signed: 03/19/2021 2:32:27 PM By: Donnamarie Poag Entered By: Donnamarie Poag on 03/19/2021 13:11:49 Waggle, [name] ([id number]) -------------------------------------------------------------------------------- Wound Assessment Details Patient Name: Faith Shark, [name] Date of Service: 03/19/2021 2:30 PM Medical Record Number: [id number] Patient Account Number: [id number] Date of Birth/Sex: 1955-11-13 (65 y.o. F) Treating RN: Donnamarie Poag Primary Care Joaquina Nissen: Barney Drain Other Clinician: Referring Melisha Eggleton: Barney Drain Treating Domique Reardon/Extender: Jeri Cos Weeks in Treatment: 2 Wound Status Wound Number: 1 Primary Pressure Ulcer Etiology: Wound Location: Sacrum Secondary Auto-immune Wounding Event: Pressure Injury Etiology: Date Acquired: 08/03/2020 Wound Open Weeks Of Treatment: 2 Status: Clustered Wound: No Comorbid Anemia, Chronic Obstructive Pulmonary Disease History: (COPD), Coronary Artery Disease, Hypertension, Osteoarthritis Wound Measurements Length: (cm) 5.7 Width: (cm) 2.6 Depth: (cm) 2.5 Area: (cm) 11.64 Volume: (cm) 29.099 % Reduction in Area: 0% % Reduction in Volume: 0% Wound Description Classification: Category/Stage IV Exudate Amount: Large Exudate Type: Serosanguineous Exudate Color: red, Patel Foul Odor After Cleansing: No Slough/Fibrino Yes Wound Bed Granulation Amount: Medium (34-66%) Exposed Structure Granulation Quality: Red, Pink Fascia Exposed: No Necrotic Amount: Medium (34-66%) Fat Layer (Subcutaneous Tissue) Exposed: Yes Necrotic Quality: Adherent Slough Tendon Exposed: No Muscle Exposed: Yes Necrosis of Muscle: No Joint Exposed: No Bone Exposed: No Treatment Notes Wound #1 (Sacrum) Cleanser Normal Saline Discharge Instruction: Wash your hands with soap and water. Remove old dressing,  discard into plastic bag and place into trash. Cleanse the wound with Normal Saline prior to applying a clean dressing using gauze sponges, not tissues or cotton balls. Do not scrub or use excessive force. Pat dry using gauze sponges, not tissue or cotton balls. Wound Cleanser Discharge Instruction: Wash your hands with soap and water. Remove old dressing, discard into plastic bag and place into trash. Cleanse the wound with Wound Cleanser prior to applying a clean  dressing using gauze sponges, not tissues or cotton balls. Do not scrub or use excessive force. Pat dry using gauze sponges, not tissue or cotton balls. Peri-Wound Care Topical Primary Dressing Hydrofera Blue Ready Transfer Foam, 4x5 (in/in) Discharge Instruction: CUT TO FIT TO INSERT INTO WOUND AND UNDER UNDERMINING EDGES-Apply Hydrofera Blue Ready to wound bed as directed Allum, Ysabella A. ([id number]) Secondary Dressing Allevyn Adhesive Foam Sacrum Dressing, 6.75x6.75 (in/in) Discharge Instruction: or alternative border foam large dressing Secured With Compression Wrap Compression Stockings Add-Ons Electronic Signature(s) Signed: 03/19/2021 2:32:27 PM By: Donnamarie Poag Entered ByDonnamarie Poag on 03/19/2021 13:02:46

## 2021-03-27 ENCOUNTER — Ambulatory Visit
Admission: RE | Admit: 2021-03-27 | Discharge: 2021-03-27 | Disposition: A | Discharge status: Home / Self Care | Payer: Medicare Other | Source: Ambulatory Visit | Attending: Hospitalist | Admitting: Hospitalist

## 2021-03-27 DIAGNOSIS — Z1231 Encounter for screening mammogram for malignant neoplasm of breast: Secondary | ICD-10-CM

## 2021-03-30 ENCOUNTER — Ambulatory Visit: Payer: Medicaid Other | Admitting: Internal Medicine

## 2021-04-02 ENCOUNTER — Ambulatory Visit: Payer: Medicaid Other

## 2021-04-09 ENCOUNTER — Other Ambulatory Visit: Payer: Self-pay

## 2021-04-09 ENCOUNTER — Ambulatory Visit (HOSPITAL_COMMUNITY)
Admission: RE | Admit: 2021-04-09 | Discharge: 2021-04-09 | Disposition: A | Discharge status: Home / Self Care | Payer: Medicare Other | Source: Ambulatory Visit | Attending: Hospitalist | Admitting: Hospitalist

## 2021-04-09 DIAGNOSIS — F17210 Nicotine dependence, cigarettes, uncomplicated: Secondary | ICD-10-CM | POA: Insufficient documentation

## 2021-04-09 DIAGNOSIS — Z122 Encounter for screening for malignant neoplasm of respiratory organs: Secondary | ICD-10-CM | POA: Insufficient documentation

## 2021-04-09 DIAGNOSIS — J449 Chronic obstructive pulmonary disease, unspecified: Secondary | ICD-10-CM | POA: Insufficient documentation

## 2021-04-11 ENCOUNTER — Other Ambulatory Visit: Payer: Self-pay

## 2021-04-11 ENCOUNTER — Emergency Department (HOSPITAL_COMMUNITY): Payer: Medicare Other

## 2021-04-11 ENCOUNTER — Inpatient Hospital Stay (HOSPITAL_COMMUNITY)
Admission: EM | Admit: 2021-04-11 | Discharge: 2021-04-15 | DRG: 058 | Disposition: A | Discharge status: Home / Self Care | Payer: Medicare Other | Attending: Internal Medicine | Admitting: Internal Medicine

## 2021-04-11 ENCOUNTER — Encounter (HOSPITAL_COMMUNITY): Payer: Self-pay

## 2021-04-11 DIAGNOSIS — R739 Hyperglycemia, unspecified: Secondary | ICD-10-CM | POA: Diagnosis present

## 2021-04-11 DIAGNOSIS — L89154 Pressure ulcer of sacral region, stage 4: Secondary | ICD-10-CM | POA: Diagnosis present

## 2021-04-11 DIAGNOSIS — Z791 Long term (current) use of non-steroidal anti-inflammatories (NSAID): Secondary | ICD-10-CM | POA: Diagnosis not present

## 2021-04-11 DIAGNOSIS — D72829 Elevated white blood cell count, unspecified: Secondary | ICD-10-CM | POA: Diagnosis present

## 2021-04-11 DIAGNOSIS — G35 Multiple sclerosis: Secondary | ICD-10-CM | POA: Diagnosis present

## 2021-04-11 DIAGNOSIS — Z993 Dependence on wheelchair: Secondary | ICD-10-CM | POA: Diagnosis not present

## 2021-04-11 DIAGNOSIS — Z122 Encounter for screening for malignant neoplasm of respiratory organs: Secondary | ICD-10-CM

## 2021-04-11 DIAGNOSIS — Z20822 Contact with and (suspected) exposure to covid-19: Secondary | ICD-10-CM | POA: Diagnosis present

## 2021-04-11 DIAGNOSIS — F17210 Nicotine dependence, cigarettes, uncomplicated: Secondary | ICD-10-CM | POA: Diagnosis present

## 2021-04-11 DIAGNOSIS — Z8249 Family history of ischemic heart disease and other diseases of the circulatory system: Secondary | ICD-10-CM | POA: Diagnosis not present

## 2021-04-11 DIAGNOSIS — K219 Gastro-esophageal reflux disease without esophagitis: Secondary | ICD-10-CM | POA: Diagnosis present

## 2021-04-11 DIAGNOSIS — Z79899 Other long term (current) drug therapy: Secondary | ICD-10-CM

## 2021-04-11 DIAGNOSIS — L89310 Pressure ulcer of right buttock, unstageable: Secondary | ICD-10-CM | POA: Diagnosis present

## 2021-04-11 DIAGNOSIS — Z885 Allergy status to narcotic agent status: Secondary | ICD-10-CM

## 2021-04-11 DIAGNOSIS — Z880 Allergy status to penicillin: Secondary | ICD-10-CM | POA: Diagnosis not present

## 2021-04-11 DIAGNOSIS — Z83438 Family history of other disorder of lipoprotein metabolism and other lipidemia: Secondary | ICD-10-CM

## 2021-04-11 DIAGNOSIS — J449 Chronic obstructive pulmonary disease, unspecified: Secondary | ICD-10-CM | POA: Diagnosis present

## 2021-04-11 DIAGNOSIS — R251 Tremor, unspecified: Secondary | ICD-10-CM | POA: Diagnosis present

## 2021-04-11 DIAGNOSIS — T380X5A Adverse effect of glucocorticoids and synthetic analogues, initial encounter: Secondary | ICD-10-CM | POA: Diagnosis present

## 2021-04-11 DIAGNOSIS — R531 Weakness: Secondary | ICD-10-CM

## 2021-04-11 DIAGNOSIS — E785 Hyperlipidemia, unspecified: Secondary | ICD-10-CM | POA: Diagnosis present

## 2021-04-11 DIAGNOSIS — I10 Essential (primary) hypertension: Secondary | ICD-10-CM | POA: Diagnosis present

## 2021-04-11 DIAGNOSIS — G35D Multiple sclerosis, unspecified: Secondary | ICD-10-CM | POA: Diagnosis present

## 2021-04-11 DIAGNOSIS — Z981 Arthrodesis status: Secondary | ICD-10-CM | POA: Diagnosis not present

## 2021-04-11 DIAGNOSIS — R29898 Other symptoms and signs involving the musculoskeletal system: Secondary | ICD-10-CM | POA: Diagnosis present

## 2021-04-11 LAB — COMPREHENSIVE METABOLIC PANEL
ALT: 23 U/L (ref 0–44)
AST: 27 U/L (ref 15–41)
Albumin: 3.9 g/dL (ref 3.5–5.0)
Alkaline Phosphatase: 95 U/L (ref 38–126)
Anion gap: 8 (ref 5–15)
BUN: 15 mg/dL (ref 8–23)
CO2: 28 mmol/L (ref 22–32)
Calcium: 9.4 mg/dL (ref 8.9–10.3)
Chloride: 101 mmol/L (ref 98–111)
Creatinine, Ser: 0.56 mg/dL (ref 0.44–1.00)
GFR, Estimated: >60 mL/min (ref >60)
Glucose, Bld: 137 mg/dL — ABNORMAL HIGH (ref 70–99)
Potassium: 3.9 mmol/L (ref 3.5–5.1)
Sodium: 137 mmol/L (ref 135–145)
Total Bilirubin: 0.5 mg/dL (ref 0.3–1.2)
Total Protein: 7.7 g/dL (ref 6.5–8.1)

## 2021-04-11 LAB — URINALYSIS, ROUTINE W REFLEX MICROSCOPIC
Bilirubin Urine: NEGATIVE
Glucose, UA: NEGATIVE mg/dL
Hgb urine dipstick: NEGATIVE
Ketones, ur: NEGATIVE mg/dL
Leukocytes,Ua: NEGATIVE
Nitrite: NEGATIVE
Protein, ur: NEGATIVE mg/dL
Specific Gravity, Urine: 1.01 (ref 1.005–1.030)
pH: 7 (ref 5.0–8.0)

## 2021-04-11 LAB — CBC WITH DIFFERENTIAL/PLATELET
Abs Immature Granulocytes: 0.08 10*3/uL — ABNORMAL HIGH (ref 0.00–0.07)
Basophils Absolute: 0 10*3/uL (ref 0.0–0.1)
Basophils Relative: 0 %
Eosinophils Absolute: 0 10*3/uL (ref 0.0–0.5)
Eosinophils Relative: 0 %
HCT: 39.2 % (ref 36.0–46.0)
Hemoglobin: 12.3 g/dL (ref 12.0–15.0)
Immature Granulocytes: 1 %
Lymphocytes Relative: 5 %
Lymphs Abs: 0.8 10*3/uL (ref 0.7–4.0)
MCH: 28.6 pg (ref 26.0–34.0)
MCHC: 31.4 g/dL (ref 30.0–36.0)
MCV: 91.2 fL (ref 80.0–100.0)
Monocytes Absolute: 1 10*3/uL (ref 0.1–1.0)
Monocytes Relative: 6 %
Neutro Abs: 14.4 10*3/uL — ABNORMAL HIGH (ref 1.7–7.7)
Neutrophils Relative %: 88 %
Platelets: 331 10*3/uL (ref 150–400)
RBC: 4.3 MIL/uL (ref 3.87–5.11)
RDW: 15.1 % (ref 11.5–15.5)
WBC: 16.4 10*3/uL — ABNORMAL HIGH (ref 4.0–10.5)
nRBC: 0 % (ref 0.0–0.2)

## 2021-04-11 LAB — RESP PANEL BY RT-PCR (FLU A&B, COVID) ARPGX2
Influenza A by PCR: NEGATIVE
Influenza B by PCR: NEGATIVE
SARS Coronavirus 2 by RT PCR: NEGATIVE

## 2021-04-11 LAB — LACTIC ACID, PLASMA: Lactic Acid, Venous: 1.2 mmol/L (ref 0.5–1.9)

## 2021-04-11 MED ORDER — SODIUM CHLORIDE 0.9 % IV BOLUS
1000.0000 mL | Freq: Once | INTRAVENOUS | Status: AC
  Administered 2021-04-11: 1000 mL via INTRAVENOUS

## 2021-04-11 MED ORDER — SODIUM CHLORIDE 0.9 % IV SOLN
1000.0000 mg | Freq: Once | INTRAVENOUS | Status: AC
  Administered 2021-04-11: 1000 mg via INTRAVENOUS
  Filled 2021-04-11: qty 16

## 2021-04-11 NOTE — Assessment & Plan Note (Signed)
Reactive

## 2021-04-11 NOTE — ED Provider Notes (Signed)
I spoke with Dr. Cheral Marker about Ms. [name] and her exacerbation of her multiple sclerosis.  He stated to make sure she did not have an infection anywhere and if she did not have an infection we could start her on Solu-Medrol 1 g a day for 5 days.  She will need to be admitted over at [facility] and get an MRI of the lumbar spine and thoracic spine with and without contrast and have neurology consulted.  The patient urinalysis is completely normal chest x-ray was normal so we went ahead and started the Solu-Medrol   Milton Ferguson, MD 04/11/21 2210

## 2021-04-11 NOTE — H&P (Signed)
History and Physical    Patient: Faith [name] LNL:[id number] DOB: 1955/10/03 DOA: 04/11/2021 DOS: the patient was seen and examined on 04/12/2021 PCP: Janifer Adie, MD  Patient coming from:   Chief Complaint: Weakness Chief Complaint  Patient presents with   generalized weakness    HPI: JADELIN [name] is a 66 y.o. female with medical history significant of  COPD, hypertension, multiple sclerosis, multiple sclerosis, wheelchair and walker dependent, pressure ulcer in the right buttock who presents to the emergency department via EMS due to weakness.  Patient states that she usually can get out of bed to a wheelchair or walker to go to the bathroom, but on waking up this morning, she was unable to lift legs off the bed.  She thought that this may be related to her multiple sclerosis, EMS was activated and patient was sent to the ED for further evaluation and management.  She denies fever, chills, headache, blurry vision, nausea, vomiting, chest pain or shortness of breath  ED course: In the emergency department, she was hemodynamically stable, work-up in the ED showed normal CBC except for leukocytosis and normal BMP except for hyperglycemia, urinalysis was normal, lactic acid was 1.2.  Influenza A, B, SARS-CoV-2 was negative. Chest x-ray showed no active disease. CT chest without contrast low-dose for lung cancer screening showed lung RADS 2, benign appearance or behavior. Neurology at [facility] was consulted and recommended Solu-Medrol 1000 mg for 5 days and that patient should be admitted to [facility] for neurology follow-up per ED physician  Review of Systems: As mentioned in the history of present illness. All other systems reviewed and are negative. Past Medical History:  Diagnosis Date   Anemia    my whole life, Used to take iron   Arthritis    spine, lumbar   COPD (chronic obstructive pulmonary disease) (HCC)    Emphysema of lung ([place])    HTN (hypertension) 03/01/2020   Hyperlipidemia  11/28/2019   Narcolepsy    by history, has nightmares   Neuromuscular disorder ([place])    leg issues from spine   Past Surgical History:  Procedure Laterality Date   ANTERIOR CERVICAL DECOMP/DISCECTOMY FUSION N/A 01/19/2019   Procedure: ANTERIOR CERVICAL DECOMPRESSION/DISCECTOMY Cervical three - four;  Surgeon: Ashok Pall, MD;  Location: [place];  Service: Neurosurgery;  Laterality: N/A;   EYE SURGERY Right    ORIF FACIAL FRACTURE     POSTERIOR CERVICAL LAMINECTOMY N/A 01/19/2019   Procedure: POSTERIOR CERVICAL LAMINECTOMY Removal of Synovial Cyst and posterior cervical fusion;  Surgeon: Ashok Pall, MD;  Location: [facility];  Service: Neurosurgery;  Laterality: N/A;   TEE WITHOUT CARDIOVERSION N/A 03/17/2020   Procedure: TRANSESOPHAGEAL ECHOCARDIOGRAM (TEE) WITH PROPOFOL;  Surgeon: Satira Sark, MD;  [facility];  Service: Cardiovascular;  Laterality: N/A;   TUBAL LIGATION     tubaligation     Social History:  reports that she has been smoking cigarettes. She has a 11.25 pack-year smoking history. She has never used smokeless tobacco. She reports current alcohol use of about 1.0 standard drink per week. She reports that she does not use drugs.  Allergies  Allergen Reactions   Darvon [Propoxyphene] Other (See Comments)    Hallicuations   Penicillins Rash    Did it involve swelling of the face/tongue/throat, SOB, or low BP? Unknown Did it involve sudden or severe rash/hives, skin peeling, or any reaction on the inside of your mouth or nose? Unknown Did you need to seek medical attention  at a hospital or doctor's office? Unknown When did it last happen?      Unknown If all above answers are NO, may proceed with cephalosporin use.     Family History  Problem Relation Age of Onset   Heart attack Mother    Kidney failure Mother    Diabetes Mother    Heart disease Mother    Hyperlipidemia Mother    Hypertension Mother    Heart attack Father 1   Hypertension Father     Hypertension Sister    Diabetes Sister    Other Brother        house fire   Hypertension Brother    Seizures Brother    Heart disease Brother        mitral valve   Arthritis Daughter        DJD, AS fibromyalgia   Polymyositis Daughter    Cancer Maternal Grandfather        lung   Heart disease Maternal Grandfather    Other Paternal Grandmother        house fire   Alcohol abuse Son     Prior to Admission medications   Medication Sig Start Date End Date Taking? Authorizing Provider  ascorbic acid (VITAMIN C) 500 MG tablet Take 1 tablet (500 mg total) by mouth daily. 02/18/21   Geradine Girt, DO  baclofen (LIORESAL) 10 MG tablet Take 1 tablet (10 mg total) by mouth 3 (three) times daily. 02/17/21   Geradine Girt, DO  celecoxib (CELEBREX) 200 MG capsule Take 1 capsule (200 mg total) by mouth 2 (two) times daily. 08/18/20   Doree Albee, MD  Cholecalciferol 50 MCG (2000 UT) TABS Take 2,000 Int'l Units by mouth daily.    [provider]  diazepam (VALIUM) 5 MG tablet Take 1 tablet (5 mg total) by mouth See admin instructions. TAKE (1) TABLET BY MOUTH EVERY SIX HOURS AS NEEDED FOR MUSCLE SPASMS. 02/17/21   Geradine Girt, DO  feeding supplement (ENSURE ENLIVE / ENSURE PLUS) LIQD Take 237 mLs by mouth 3 (three) times daily between meals. 12/12/20   Alma [facility], MD  gabapentin (NEURONTIN) 300 MG capsule Take 2 capsules (600 mg total) by mouth 3 (three) times daily. 02/05/19   Angiulli, Lavon Paganini, PA-C  HYDROcodone-acetaminophen (NORCO/VICODIN) 5-325 MG tablet Take 1 tablet by mouth every 6 (six) hours as needed for moderate pain. 02/17/21   Geradine Girt, DO  metroNIDAZOLE (FLAGYL) 500 MG tablet 500 mg daily. Topical for wound 11/18/20   [provider]  pantoprazole (PROTONIX) 40 MG tablet Take 1 tablet (40 mg total) by mouth daily. 02/17/21 02/17/22  Geradine Girt, DO  polyethylene glycol (MIRALAX) 17 g packet Take 17 g by mouth daily. 12/25/20    Fredia Sorrow, MD  propranolol (INDERAL) 40 MG tablet Take 0.5 tablets (20 mg total) by mouth 2 (two) times daily. 02/17/21   Geradine Girt, DO  sodium hypochlorite (DAKIN'S 1/4 STRENGTH) 0.125 % SOLN Apply topically. 12/30/20   [provider]  zinc sulfate 220 (50 Zn) MG capsule Take 1 capsule (220 mg total) by mouth daily. 02/18/21   Geradine Girt, DO    Physical Exam: Vitals:   04/11/21 2030 04/11/21 2100 04/11/21 2130 04/11/21 2300  BP: 113/72 122/81 108/67 105/75  Pulse: 91 (!) 102  86  Resp: 15 (!) 23 14 14   Temp:      SpO2: 94% 98%  98%  Weight:  Height:        Physical Exam  Gen:- Awake and alert.  Patient was in no acute distress HEENT:- [place].AT, No sclera icterus Neck-Supple Neck,No JVD,.  Lungs-  CTAB, no wheezes/rhonchi CV- S1, S2 normal, no rubs/gallops Abd-  +ve B.Sounds, Abd Soft, No tenderness Musculoskeletal: Strength upper extremities 5/5 bilaterally, lower extremities 2/5 bilaterally Extremity/Skin:- No  edema, skin warm and dry Psych-affect is appropriate, oriented x3 Neuro-no new focal deficits, no tremors   Data Reviewed: No new data to review at this time  Assessment and Plan: * Exacerbation of multiple sclerosis ([place])- (present on admission) Cont. steroids  Leukocytosis Reactive  Hyperglycemia Reactive  Screening for lung cancer CT  Generalized weakness Due to MS  Sacral decubitus ulcer, stage IV (HCC)- (present on admission) Wound care  Essential hypertension Controlled  Bilateral leg weakness- (present on admission) Due to MS exacerbation   Generalized weakness in the setting of exacerbation of multiple sclerosis Bilateral weakness in the setting of above Neurology at [facility] consulted on recommended admitting patient to [facility] for further neurology follow-up/work-up Continue Solu-Medrol 1000 mg daily MRI of thoracic and lumbar spine with and without contrast pending Continue fall precaution, neurochecks Continue  PT/OT eval and treat We shall await further recommendation from neurology  Leukocytosis possibly reactive WBC 16.4, no obvious sign of acute infectious process at this time Continue to monitor WBC with morning labs  Hyperglycemia possibly reactive CBG 137, continue to monitor blood glucose levels with morning labs  Sacral decubitus ulcer Continue wound care  GERD Continue Protonix   Advance Care Planning:   Code Status: Prior   Consults: Neurology  Family Communication: None at bedside  Severity of Illness: The appropriate patient status for this patient is INPATIENT. Inpatient status is judged to be reasonable and necessary in order to provide the required intensity of service to ensure the patient's safety. The patient's presenting symptoms, physical exam findings, and initial radiographic and laboratory data in the context of their chronic comorbidities is felt to place them at high risk for further clinical deterioration. Furthermore, it is not anticipated that the patient will be medically stable for discharge from the hospital within 2 midnights of admission.   * I certify that at the point of admission it is my clinical judgment that the patient will require inpatient hospital care spanning beyond 2 midnights from the point of admission due to high intensity of service, high risk for further deterioration and high frequency of surveillance required.*  Author: Bernadette Hoit, DO 04/12/2021 12:19 AM  For on call review [email or web address].

## 2021-04-11 NOTE — Assessment & Plan Note (Addendum)
Likely from steroids.  Low suspicious for infection etiology.  Blood cultures have not shown any growth

## 2021-04-11 NOTE — Assessment & Plan Note (Signed)
Controlled.  

## 2021-04-11 NOTE — Assessment & Plan Note (Signed)
CT

## 2021-04-11 NOTE — Assessment & Plan Note (Addendum)
Due to MS exacerbation She is wheelchair-bound at baseline.PT recommended outpatient follow-up

## 2021-04-11 NOTE — ED Triage Notes (Addendum)
EMS called out for generalized weakness, pt from home, son lives there, upon arrival pt was sitting in chair, c/o weakness and not being able to get to bathroom. Pt was admitted here for Covid 02/18/21. Vitals WNL per EMS

## 2021-04-11 NOTE — Assessment & Plan Note (Addendum)
Without evidence of any underlying infection Continue home medications including baclofen, Valium, Neurontin, Norco Continue Solu-Medrol 1 g daily for 5-day course, day 4/5 Neurology was consulted. MRI showed Extensive patchy cord signal abnormality throughout the thoracic spinal cord, consistent with history of chronic demyelinating disease/multiple sclerosis.  No new evidence of demyelination

## 2021-04-11 NOTE — Assessment & Plan Note (Addendum)
Present on admission WOC consulted

## 2021-04-11 NOTE — ED Provider Notes (Signed)
[facility] EMERGENCY DEPARTMENT Provider Note   CSN: [id number] Arrival date & time: 04/11/21  1906     History  Chief Complaint  Patient presents with   generalized weakness    Faith [name] is a 66 y.o. female.  Patient has a history of multiple sclerosis.  She stated that normally she can get out of bed and into a wheelchair.  She can also walk some with a walker to the bathroom.  She does have significant weakness in her leg.  Today she was unable to get out of bed she can barely lift her legs off the bed for a couple seconds  The history is provided by the patient and medical records. No language interpreter was used.  Weakness Severity:  Moderate Onset quality:  Sudden Timing:  Constant Progression:  Worsening Chronicity:  Recurrent Context: not alcohol use   Relieved by:  None tried Worsened by:  Nothing Ineffective treatments:  None tried Associated symptoms: no abdominal pain, no chest pain, no cough, no diarrhea, no frequency, no headaches and no seizures       Home Medications Prior to Admission medications   Medication Sig Start Date End Date Taking? Authorizing Provider  ascorbic acid (VITAMIN C) 500 MG tablet Take 1 tablet (500 mg total) by mouth daily. 02/18/21   Geradine Girt, DO  baclofen (LIORESAL) 10 MG tablet Take 1 tablet (10 mg total) by mouth 3 (three) times daily. 02/17/21   Geradine Girt, DO  celecoxib (CELEBREX) 200 MG capsule Take 1 capsule (200 mg total) by mouth 2 (two) times daily. 08/18/20   Doree Albee, MD  Cholecalciferol 50 MCG (2000 UT) TABS Take 2,000 Int'l Units by mouth daily.    [provider]  diazepam (VALIUM) 5 MG tablet Take 1 tablet (5 mg total) by mouth See admin instructions. TAKE (1) TABLET BY MOUTH EVERY SIX HOURS AS NEEDED FOR MUSCLE SPASMS. 02/17/21   Geradine Girt, DO  feeding supplement (ENSURE ENLIVE / ENSURE PLUS) LIQD Take 237 mLs by mouth 3 (three) times daily between meals. 12/12/20   Alma Friendly, MD  gabapentin (NEURONTIN) 300 MG capsule Take 2 capsules (600 mg total) by mouth 3 (three) times daily. 02/05/19   Angiulli, Lavon Paganini, PA-C  HYDROcodone-acetaminophen (NORCO/VICODIN) 5-325 MG tablet Take 1 tablet by mouth every 6 (six) hours as needed for moderate pain. 02/17/21   Geradine Girt, DO  metroNIDAZOLE (FLAGYL) 500 MG tablet 500 mg daily. Topical for wound 11/18/20   [provider]  pantoprazole (PROTONIX) 40 MG tablet Take 1 tablet (40 mg total) by mouth daily. 02/17/21 02/17/22  Geradine Girt, DO  polyethylene glycol (MIRALAX) 17 g packet Take 17 g by mouth daily. 12/25/20   Fredia Sorrow, MD  propranolol (INDERAL) 40 MG tablet Take 0.5 tablets (20 mg total) by mouth 2 (two) times daily. 02/17/21   Geradine Girt, DO  sodium hypochlorite (DAKIN'S 1/4 STRENGTH) 0.125 % SOLN Apply topically. 12/30/20   [provider]  zinc sulfate 220 (50 Zn) MG capsule Take 1 capsule (220 mg total) by mouth daily. 02/18/21   Geradine Girt, DO      Allergies    Darvon [propoxyphene] and Penicillins    Review of Systems   Review of Systems  Constitutional:  Negative for appetite change and fatigue.  HENT:  Negative for congestion, ear discharge and sinus pressure.   Eyes:  Negative for discharge.  Respiratory:  Negative for cough.  Cardiovascular:  Negative for chest pain.  Gastrointestinal:  Negative for abdominal pain and diarrhea.  Genitourinary:  Negative for frequency and hematuria.  Musculoskeletal:  Negative for back pain.  Skin:  Negative for rash.  Neurological:  Positive for weakness. Negative for seizures and headaches.  Psychiatric/Behavioral:  Negative for hallucinations.    Physical Exam Updated Vital Signs BP 108/67    Pulse (!) 102    Temp 98.1 F (36.7 C)    Resp 14    Ht 5\' 4"  (1.626 m)    Wt 46.3 kg    SpO2 98%    BMI 17.52 kg/m  Physical Exam Vitals and nursing note reviewed.  Constitutional:      Appearance: She is  well-developed.  HENT:     Head: Normocephalic.     Nose: Nose normal.  Eyes:     General: No scleral icterus.    Conjunctiva/sclera: Conjunctivae normal.  Neck:     Thyroid: No thyromegaly.  Cardiovascular:     Rate and Rhythm: Normal rate and regular rhythm.     Heart sounds: No murmur heard.   No friction rub. No gallop.  Pulmonary:     Breath sounds: No stridor. No wheezing or rales.  Chest:     Chest wall: No tenderness.  Abdominal:     General: There is no distension.     Tenderness: There is no abdominal tenderness. There is no rebound.  Musculoskeletal:        General: Normal range of motion.     Cervical back: Neck supple.     Comments: Patient with profound weakness to both lower extremities.  She can just lift them off the bed a couple inches just for seconds.  Usually she can get out of bed and she can walk with a walker  Lymphadenopathy:     Cervical: No cervical adenopathy.  Skin:    Findings: No erythema or rash.     Comments: Patient has stage IV decubitus that which is clean yesterday does not look infected  Neurological:     Mental Status: She is alert and oriented to person, place, and time.     Motor: No abnormal muscle tone.     Coordination: Coordination normal.  Psychiatric:        Behavior: Behavior normal.    ED Results / Procedures / Treatments   Labs (all labs ordered are listed, but only abnormal results are displayed) Labs Reviewed  CBC WITH DIFFERENTIAL/PLATELET - Abnormal; Notable for the following components:      Result Value   WBC 16.4 (*)    Neutro Abs 14.4 (*)    Abs Immature Granulocytes 0.08 (*)    All other components within normal limits  COMPREHENSIVE METABOLIC PANEL - Abnormal; Notable for the following components:   Glucose, Bld 137 (*)    All other components within normal limits  CULTURE, BLOOD (ROUTINE X 2)  CULTURE, BLOOD (ROUTINE X 2)  RESP PANEL BY RT-PCR (FLU A&B, COVID) ARPGX2  URINALYSIS, ROUTINE W REFLEX  MICROSCOPIC  LACTIC ACID, PLASMA    EKG None  Radiology DG Chest Port 1 View  Result Date: 04/11/2021 CLINICAL DATA:  Weakness. EXAM: PORTABLE CHEST 1 VIEW COMPARISON:  Chest radiograph dated 02/14/2021 and CT dated 04/09/2021. FINDINGS: Emphysematous changes of the lungs. No focal consolidation, pleural effusion, or pneumothorax. The cardiac silhouette is within limits. Atherosclerotic calcification of the aorta. No acute osseous pathology. IMPRESSION: No active disease. Electronically Signed   By: Milas Hock  Radparvar M.D.   On: 04/11/2021 20:44    Procedures Procedures    Medications Ordered in ED Medications  methylPREDNISolone sodium succinate (SOLU-MEDROL) 1,000 mg in sodium chloride 0.9 % 50 mL IVPB (has no administration in time range)  sodium chloride 0.9 % bolus 1,000 mL (1,000 mLs Intravenous New Bag/Given 04/11/21 2128)  CRITICAL CARE Performed by: Milton Ferguson Total critical care time: 40 minutes Critical care time was exclusive of separately billable procedures and treating other patients. Critical care was necessary to treat or prevent imminent or life-threatening deterioration. Critical care was time spent personally by me on the following activities: development of treatment plan with patient and/or surrogate as well as nursing, discussions with consultants, evaluation of patient's response to treatment, examination of patient, obtaining history from patient or surrogate, ordering and performing treatments and interventions, ordering and review of laboratory studies, ordering and review of radiographic studies, pulse oximetry and re-evaluation of patient's condition.   ED Course/ Medical Decision Making/ A&P                           Medical Decision Making Amount and/or Complexity of Data Reviewed Labs: ordered. Radiology: ordered.  Risk Decision regarding hospitalization.   Patient with MS exacerbation.  She will be admitted over to [facility] with  neurology consult.  She will get an MRI of the thoracic spine and lumbar spine with and without and be started on Solu-Medrol 1 g a day for 5 days   This patient presents to the ED for concern of weakness in her legs, this involves an extensive number of treatment options, and is a complaint that carries with it a high risk of complications and morbidity.  The differential diagnosis includes stroke, worsening MS   Co morbidities that complicate the patient evaluation  Multiple sclerosis   Additional history obtained:  Additional history obtained from patient External records from outside source obtained and reviewed including hospital records   Lab Tests:  I Ordered, and personally interpreted labs.  The pertinent results include: CBC shows elevated white count 1.7 1110.6   Imaging Studies ordered:  I ordered imaging studies including chest x-ray unremarkable I independently visualized and interpreted imaging which showed unremarkable I agree with the radiologist interpretation   Cardiac Monitoring:  The patient was maintained on a cardiac monitor.  I personally viewed and interpreted the cardiac monitored which showed an underlying rhythm of: Normal sinus rhythm   Medicines ordered and prescription drug management:  I ordered medication including high-dose Solu-Medrol for MS Reevaluation of the patient after these medicines showed that the patient stayed the same I have reviewed the patients home medicines and have made adjustments as needed   Test Considered:  MRI lumbar and thoracic spine   Critical Interventions:  Neurology consult   Consultations Obtained:  I requested consultation with the neurology and hospitalist,  and discussed lab and imaging findings as well as pertinent plan - they recommend: Neurology recommended Solu-Medrol and transferred ported patient to [facility] for [facility].  Hospitalist will admit   Problem List / ED Course:  Weakness in legs  and multiple sclerosis   Reevaluation:  After the interventions noted above, I reevaluated the patient and found that they have :stayed the same   Social Determinants of Health:  Wheelchair-bound   Dispostion:  After consideration of the diagnostic results and the patients response to treatment, I feel that the patent would benefit from admission to the  hospital with high-dose steroids.          Final Clinical Impression(s) / ED Diagnoses Final diagnoses:  Multiple sclerosis exacerbation ([place])    Rx / DC Orders ED Discharge Orders     None         Milton Ferguson, MD 04/13/21 1058

## 2021-04-11 NOTE — Assessment & Plan Note (Deleted)
Due to MS

## 2021-04-12 ENCOUNTER — Inpatient Hospital Stay (HOSPITAL_COMMUNITY): Payer: Medicare Other

## 2021-04-12 ENCOUNTER — Encounter (HOSPITAL_COMMUNITY): Payer: Self-pay | Admitting: Internal Medicine

## 2021-04-12 DIAGNOSIS — K219 Gastro-esophageal reflux disease without esophagitis: Secondary | ICD-10-CM

## 2021-04-12 DIAGNOSIS — G35 Multiple sclerosis: Secondary | ICD-10-CM | POA: Diagnosis not present

## 2021-04-12 HISTORY — DX: Gastro-esophageal reflux disease without esophagitis: K21.9

## 2021-04-12 LAB — COMPREHENSIVE METABOLIC PANEL
ALT: 19 U/L (ref 0–44)
AST: 23 U/L (ref 15–41)
Albumin: 3 g/dL — ABNORMAL LOW (ref 3.5–5.0)
Alkaline Phosphatase: 76 U/L (ref 38–126)
Anion gap: 4 — ABNORMAL LOW (ref 5–15)
BUN: 13 mg/dL (ref 8–23)
CO2: 24 mmol/L (ref 22–32)
Calcium: 8.5 mg/dL — ABNORMAL LOW (ref 8.9–10.3)
Chloride: 111 mmol/L (ref 98–111)
Creatinine, Ser: 0.56 mg/dL (ref 0.44–1.00)
GFR, Estimated: >60 mL/min (ref >60)
Glucose, Bld: 149 mg/dL — ABNORMAL HIGH (ref 70–99)
Potassium: 3.7 mmol/L (ref 3.5–5.1)
Sodium: 139 mmol/L (ref 135–145)
Total Bilirubin: 0.1 mg/dL — ABNORMAL LOW (ref 0.3–1.2)
Total Protein: 6.4 g/dL — ABNORMAL LOW (ref 6.5–8.1)

## 2021-04-12 LAB — CBC
HCT: 33.5 % — ABNORMAL LOW (ref 36.0–46.0)
Hemoglobin: 10.6 g/dL — ABNORMAL LOW (ref 12.0–15.0)
MCH: 28.4 pg (ref 26.0–34.0)
MCHC: 31.6 g/dL (ref 30.0–36.0)
MCV: 89.8 fL (ref 80.0–100.0)
Platelets: 287 10*3/uL (ref 150–400)
RBC: 3.73 MIL/uL — ABNORMAL LOW (ref 3.87–5.11)
RDW: 15 % (ref 11.5–15.5)
WBC: 12.7 10*3/uL — ABNORMAL HIGH (ref 4.0–10.5)
nRBC: 0 % (ref 0.0–0.2)

## 2021-04-12 LAB — APTT: aPTT: 34 seconds (ref 24–36)

## 2021-04-12 LAB — MAGNESIUM: Magnesium: 1.8 mg/dL (ref 1.7–2.4)

## 2021-04-12 LAB — PHOSPHORUS: Phosphorus: 3.2 mg/dL (ref 2.5–4.6)

## 2021-04-12 MED ORDER — GADOBUTROL 1 MMOL/ML IV SOLN
4.5000 mL | Freq: Once | INTRAVENOUS | Status: AC | PRN
  Administered 2021-04-12: 4.5 mL via INTRAVENOUS

## 2021-04-12 MED ORDER — POLYETHYLENE GLYCOL 3350 17 G PO PACK
17.0000 g | PACK | Freq: Every day | ORAL | Status: DC
  Administered 2021-04-12 – 2021-04-15 (×4): 17 g via ORAL
  Filled 2021-04-12 (×4): qty 1

## 2021-04-12 MED ORDER — PROPRANOLOL HCL 20 MG PO TABS
20.0000 mg | ORAL_TABLET | Freq: Two times a day (BID) | ORAL | Status: DC
  Administered 2021-04-12 – 2021-04-15 (×5): 20 mg via ORAL
  Filled 2021-04-12 (×4): qty 1
  Filled 2021-04-12: qty 2
  Filled 2021-04-12 (×3): qty 1

## 2021-04-12 MED ORDER — PANTOPRAZOLE SODIUM 40 MG PO TBEC
40.0000 mg | DELAYED_RELEASE_TABLET | Freq: Every day | ORAL | Status: DC
  Administered 2021-04-12 – 2021-04-15 (×4): 40 mg via ORAL
  Filled 2021-04-12 (×4): qty 1

## 2021-04-12 MED ORDER — BACLOFEN 10 MG PO TABS
10.0000 mg | ORAL_TABLET | Freq: Three times a day (TID) | ORAL | Status: DC
  Administered 2021-04-12 – 2021-04-15 (×11): 10 mg via ORAL
  Filled 2021-04-12 (×11): qty 1

## 2021-04-12 MED ORDER — GABAPENTIN 300 MG PO CAPS
600.0000 mg | ORAL_CAPSULE | Freq: Three times a day (TID) | ORAL | Status: DC
  Administered 2021-04-12 – 2021-04-15 (×11): 600 mg via ORAL
  Filled 2021-04-12 (×11): qty 2

## 2021-04-12 MED ORDER — ENSURE ENLIVE PO LIQD
237.0000 mL | Freq: Three times a day (TID) | ORAL | Status: DC
  Administered 2021-04-12 – 2021-04-15 (×11): 237 mL via ORAL
  Filled 2021-04-12 (×5): qty 237

## 2021-04-12 MED ORDER — ENOXAPARIN SODIUM 40 MG/0.4ML IJ SOSY
40.0000 mg | PREFILLED_SYRINGE | INTRAMUSCULAR | Status: DC
  Administered 2021-04-12 – 2021-04-15 (×4): 40 mg via SUBCUTANEOUS
  Filled 2021-04-12 (×4): qty 0.4

## 2021-04-12 MED ORDER — DIAZEPAM 5 MG PO TABS
5.0000 mg | ORAL_TABLET | Freq: Four times a day (QID) | ORAL | Status: DC | PRN
  Administered 2021-04-12 – 2021-04-15 (×4): 5 mg via ORAL
  Filled 2021-04-12 (×4): qty 1

## 2021-04-12 MED ORDER — SODIUM CHLORIDE 0.9 % IV SOLN
1000.0000 mg | Freq: Every day | INTRAVENOUS | Status: AC
  Administered 2021-04-12 – 2021-04-15 (×4): 1000 mg via INTRAVENOUS
  Filled 2021-04-12 (×6): qty 16

## 2021-04-12 MED ORDER — CELECOXIB 200 MG PO CAPS
200.0000 mg | ORAL_CAPSULE | Freq: Two times a day (BID) | ORAL | Status: DC
  Administered 2021-04-12 – 2021-04-15 (×7): 200 mg via ORAL
  Filled 2021-04-12: qty 2
  Filled 2021-04-12 (×6): qty 1

## 2021-04-12 MED ORDER — HYDROCODONE-ACETAMINOPHEN 5-325 MG PO TABS
1.0000 | ORAL_TABLET | Freq: Four times a day (QID) | ORAL | Status: DC | PRN
Start: 2021-04-12 — End: 2021-04-15
  Administered 2021-04-12 – 2021-04-14 (×5): 1 via ORAL
  Filled 2021-04-12 (×5): qty 1

## 2021-04-12 NOTE — ED Notes (Signed)
Feed pt breakfast

## 2021-04-12 NOTE — ED Notes (Signed)
Spoke with [facility] to setup transportation at this time.

## 2021-04-12 NOTE — Consult Note (Signed)
NEURO HOSPITALIST CONSULT NOTE   Requestig physician: Dr. Tawanna Solo  Reason for Consult: Possible MS exacerbation  History obtained from:   Patient and Chart     HPI:                                                                                                                                          Faith [name] is an 66 y.o. female with multiple sclerosis and chronic sacral decubitus ulcer, formerly on Tysabri but not currently on any disease modifying therapy, recent admission for Covid infection Dec 21, who presented to the [facility] ED via EMS with symptoms of worsened BLE weakness. The patient stated to the ED staff that she felt that she was having an MS exacerbation. Her symptoms consisted of generalized weakness and not being able to ambulate to the bathroom with her walker as she normally does. At baseline she states that she is able to ambulate with her walker to and from her wheelchair and/or the bathroom about 10 times per day.    In the [facility] ED, she was hemodynamically stable, with normal CBC except for leukocytosis, normal BMP except for hyperglycemia, normal U/A and lactic acid of 1.2.  Influenza A, B and SARS-CoV-2 PCR tests were negative. Chest x-ray showed no active disease.  She was transferred to [facility] for evaluation and management of probable MS exacerbation to include MRI of thoracic spine and pulsed dose steroid treatment.   Her PMHx also includes HTN, COPD, right buttock pressure ulcer, anemia and HLD. Epic also lists "narcolepsy by history, has nightmares". Surgical history includes prior cervical ACDF.   Home medications include baclofen and Neurontin.   Past Medical History:  Diagnosis Date   Anemia    my whole life, Used to take iron   Arthritis    spine, lumbar   COPD (chronic obstructive pulmonary disease) (HCC)    Emphysema of lung ([place])    GERD (gastroesophageal reflux disease) 04/12/2021   HTN (hypertension) 03/01/2020   Hyperlipidemia  11/28/2019   Narcolepsy    by history, has nightmares   Neuromuscular disorder ([place])    leg issues from spine    Past Surgical History:  Procedure Laterality Date   ANTERIOR CERVICAL DECOMP/DISCECTOMY FUSION N/A 01/19/2019   Procedure: ANTERIOR CERVICAL DECOMPRESSION/DISCECTOMY Cervical three - four;  Surgeon: Ashok Pall, MD;  Location: [facility];  Service: Neurosurgery;  Laterality: N/A;   EYE SURGERY Right    ORIF FACIAL FRACTURE     POSTERIOR CERVICAL LAMINECTOMY N/A 01/19/2019   Procedure: POSTERIOR CERVICAL LAMINECTOMY Removal of Synovial Cyst and posterior cervical fusion;  Surgeon: Ashok Pall, MD;  Location: [facility];  Service: Neurosurgery;  Laterality: N/A;   TEE WITHOUT CARDIOVERSION N/A 03/17/2020   Procedure: TRANSESOPHAGEAL ECHOCARDIOGRAM (TEE) WITH PROPOFOL;  Surgeon: Satira Sark, MD;  [facility];  Service: Cardiovascular;  Laterality: N/A;   TUBAL LIGATION     tubaligation      Family History  Problem Relation Age of Onset   Heart attack Mother    Kidney failure Mother    Diabetes Mother    Heart disease Mother    Hyperlipidemia Mother    Hypertension Mother    Heart attack Father 6   Hypertension Father    Hypertension Sister    Diabetes Sister    Other Brother        house fire   Hypertension Brother    Seizures Brother    Heart disease Brother        mitral valve   Arthritis Daughter        DJD, AS fibromyalgia   Polymyositis Daughter    Cancer Maternal Grandfather        lung   Heart disease Maternal Grandfather    Other Paternal Grandmother        house fire   Alcohol abuse Son            Social History:  reports that she has been smoking cigarettes. She has a 11.25 pack-year smoking history. She has never used smokeless tobacco. She reports current alcohol use of about 1.0 standard drink per week. She reports that she does not use drugs.  Allergies  Allergen Reactions   Darvon [Propoxyphene] Other (See Comments)     Hallicuations   Penicillins Rash    Did it involve swelling of the face/tongue/throat, SOB, or low BP? Unknown Did it involve sudden or severe rash/hives, skin peeling, or any reaction on the inside of your mouth or nose? Unknown Did you need to seek medical attention at a hospital or doctor's office? Unknown When did it last happen?      Unknown If all above answers are NO, may proceed with cephalosporin use.     MEDICATIONS:                                                                                                                     Prior to Admission:  Medications Prior to Admission  Medication Sig Dispense Refill Last Dose   baclofen (LIORESAL) 10 MG tablet Take 1 tablet (10 mg total) by mouth 3 (three) times daily. 10 each 0 04/11/2021   celecoxib (CELEBREX) 200 MG capsule Take 1 capsule (200 mg total) by mouth 2 (two) times daily. 60 capsule 2 04/11/2021   Cholecalciferol (VITAMIN D3) 50 MCG (2000 UT) capsule Take 2,000 Units by mouth daily.   04/11/2021   diazepam (VALIUM) 5 MG tablet Take 1 tablet (5 mg total) by mouth See admin instructions. TAKE (1) TABLET BY MOUTH EVERY SIX HOURS AS NEEDED FOR MUSCLE SPASMS. 10 tablet 0 04/11/2021   feeding supplement (ENSURE ENLIVE / ENSURE PLUS) LIQD Take 237 mLs by mouth 3 (three) times daily between meals. 237 mL 0 04/11/2021   gabapentin (NEURONTIN) 300  MG capsule Take 2 capsules (600 mg total) by mouth 3 (three) times daily. 180 capsule 1 04/11/2021   HYDROcodone-acetaminophen (NORCO/VICODIN) 5-325 MG tablet Take 1 tablet by mouth every 6 (six) hours as needed for moderate pain. 10 tablet 0 04/11/2021   metroNIDAZOLE (FLAGYL) 500 MG tablet 500 mg daily. Topical for wound   04/11/2021   pantoprazole (PROTONIX) 40 MG tablet Take 1 tablet (40 mg total) by mouth daily. 30 tablet 0 04/11/2021   polyethylene glycol (MIRALAX) 17 g packet Take 17 g by mouth daily. 14 each 0 unknown   propranolol (INDERAL) 40 MG tablet Take 0.5 tablets (20 mg total) by  mouth 2 (two) times daily. (Patient taking differently: Take 60 mg by mouth at bedtime.) 60 tablet 3 04/11/2021 at 2200   sodium hypochlorite (DAKIN'S 1/4 STRENGTH) 0.125 % SOLN Apply topically.   unknown   zinc sulfate 220 (50 Zn) MG capsule Take 1 capsule (220 mg total) by mouth daily.   unknown   ascorbic acid (VITAMIN C) 500 MG tablet Take 1 tablet (500 mg total) by mouth daily. (Patient not taking: Reported on 04/12/2021)   Not Taking   Cholecalciferol 50 MCG (2000 UT) TABS Take 2,000 Int'l Units by mouth daily.      Scheduled:  baclofen  10 mg Oral TID   celecoxib  200 mg Oral BID   enoxaparin (LOVENOX) injection  40 mg Subcutaneous Q24H   feeding supplement  237 mL Oral TID BM   gabapentin  600 mg Oral TID   pantoprazole  40 mg Oral Daily   polyethylene glycol  17 g Oral Daily   propranolol  20 mg Oral BID   Continuous:  methylPREDNISolone (SOLU-MEDROL) injection Stopped (04/12/21 1156)     ROS:                                                                                                                                       As per HPI. Does not endorse any additional symptoms.    Blood pressure 122/78, pulse 94, temperature 97.7 F (36.5 C), temperature source Oral, resp. rate 16, height 5\' 4"  (1.626 m), weight 46.3 kg, SpO2 94 %.   General Examination:                                                                                                       Physical Exam  HEENT-  [place]/AT    Lungs-Respirations unlabored  Extremities- No edema.   Neurological Examination Mental  Status: Awake, alert and oriented. Able to answer detailed questions about her medical history and recent symptoms. Speech fluent without evidence of aphasia.  Able to follow all commands without difficulty. Cranial Nerves: II: Temporal visual fields intact bilaterally with no extinction to DSS. PERRL.   III,IV, VI: No ptosis. EOMI with saccadic pursuits noted. No nystagmus.  V: Temp sensation equal  bilaterally.  VII: Smile symmetric VIII: Hearing intact to voice IX,X: No hypophonia or hoarseness XI: Symmetric XII: Midline tongue extension Motor: BUE 4+/5 proximally and distally RLE 3/5 HF, 4/5 KE, 4/5 ADF LLE 2-3/5 HF, 4-/5 KE, 4-/5 ADF Decreased muscle bulk x 4. Spastic tone all 4 extremities.  Sensory: Temp and light touch intact throughout, bilaterally Deep Tendon Reflexes: 3+ bilateral brachioradialis and biceps. 4+ bilateral patellae and achilles. Sustained clonus is elicited by passive dorsiflexion of ankles.  Plantars: Upgoing bilaterally  Cerebellar: Prominent action tremor is present with FNF bilaterally. Slight ataxia is also noted.  Gait: Unable to assess   Lab Results: Basic Metabolic Panel: Recent Labs  Lab 04/11/21 1933 04/12/21 0546  NA 137 139  K 3.9 3.7  CL 101 111  CO2 28 24  GLUCOSE 137* 149*  BUN 15 13  CREATININE 0.56 0.56  CALCIUM 9.4 8.5*  MG  --  1.8  PHOS  --  3.2    CBC: Recent Labs  Lab 04/11/21 1933 04/12/21 0546  WBC 16.4* 12.7*  NEUTROABS 14.4*  --   HGB 12.3 10.6*  HCT 39.2 33.5*  MCV 91.2 89.8  PLT 331 287    Cardiac Enzymes: No results for input(s): CKTOTAL, CKMB, CKMBINDEX, TROPONINI in the last 168 hours.  Lipid Panel: No results for input(s): CHOL, TRIG, HDL, CHOLHDL, VLDL, LDLCALC in the last 168 hours.  Imaging: DG Chest Port 1 View  Result Date: 04/11/2021 CLINICAL DATA:  Weakness. EXAM: PORTABLE CHEST 1 VIEW COMPARISON:  Chest radiograph dated 02/14/2021 and CT dated 04/09/2021. FINDINGS: Emphysematous changes of the lungs. No focal consolidation, pleural effusion, or pneumothorax. The cardiac silhouette is within limits. Atherosclerotic calcification of the aorta. No acute osseous pathology. IMPRESSION: No active disease. Electronically Signed   By: Anner Crete M.D.   On: 04/11/2021 20:44     Assessment: 66 year old female with multiple sclerosis, presenting with symptoms of possible exacerbation 1. Exam  reveals neurological deficits referable to significant chronic spinal cord pathology with a possible acute component.  2. MRI of thoracic spine with and without contrast: Extensive patchy cord signal abnormality throughout the thoracic spinal cord, consistent with history of chronic demyelinating disease/multiple sclerosis. Overall, appearance is grossly similar as compared to 03/01/2020, with no evidence of significant disease progression. No abnormal enhancement to suggest active demyelination. Underlying mild for age thoracic spondylosis without significant spinal stenosis. 3. MRI of lumbar spine reveals soft tissue edema at the location of her decubitus ulcer, but no findings to explain her worsened lower extremity weakness.  4. Elevated WBC has trended down to 12.7.  5. Overall presentation is most consistent with an MS exacerbation. Patients with predominantly spinal cord manifestations often present with exacerbations that do not show a detectable acute enhancing lesion(s) on MRI. Infections can result in pseudo-exacerbations, but her WBC is trending down and she does not appear septic.   Recommendations: 1. Continue IV Solumedrol at 1000 mg qd for a total of 5 days. Monday will be day 3/5 with last dose on Wednesday.  2. Monitor blood glucose, electrolytes and CBC while on steroids.  3.  Management of her decubitus ulcer.  4. PT/OT 5. Will need Case Management consult to assess for possible modifications to her insurance. The patient is 66 years of age, which should clear the way for full Medicare benefits, but patient states that she has been having trouble with obtaining full coverage, stating "all I can get is D". She seems poorly educated regarding her coverage options. She has not  been seeing a Neurologist for her MS, and this may be due to insurance issues.  6. Will need outpatient Neurology follow up. Recommend Dr. Felecia Shelling of [facility].   Electronically signed: Dr.  Kerney Elbe 04/12/2021, 10:07 PM

## 2021-04-12 NOTE — ED Notes (Signed)
Pt unable to eat meal tray. Got her a applesauce and a Ensure . Had sec call and order pt a peanutbutter and jelly sandwich.Marland Kitchen

## 2021-04-12 NOTE — Hospital Course (Addendum)
Faith [name] is a 66 y.o. female with medical history significant of COPD, hypertension, multiple sclerosis, wheelchair dependent, pressure ulcer in the right buttock POA who presents to the emergency department via EMS due to weakness at [facility].  Patient stated that she usually can get out of bed to a wheelchair or walker to go to the bathroom, but  she was unable to lift legs off the bed.  She thought that this may be related to her multiple sclerosis.  EMS was activated and patient was sent to the ED for further evaluation and management.  Neurology was consulted and recommended to start IV solumedrol and transfer to [facility] hospital for neurology evaluation. Plan for 5 days course.day 4/5.PT/OT  recommended outpatient follow up.  Discharge planning to home with home tomorrow after she completes 5 days course of Solu-Medrol

## 2021-04-12 NOTE — Assessment & Plan Note (Addendum)
Continue Protonix °

## 2021-04-12 NOTE — Assessment & Plan Note (Signed)
-  Continue propranolol 

## 2021-04-12 NOTE — ED Notes (Signed)
Checked on pt got her another Ensure not sure when her sandwich will come

## 2021-04-12 NOTE — Progress Notes (Signed)
°  Progress Note   Patient: Faith [name] KPT:[id number] DOB: 17-Jan-1956 DOA: 04/11/2021     1 DOS: the patient was seen and examined on 04/12/2021   Brief hospital course: Faith [name] is a 66 y.o. female with medical history significant of COPD, hypertension, multiple sclerosis, wheelchair dependent, pressure ulcer in the right buttock POA who presents to the emergency department via EMS due to weakness.  Patient states that she usually can get out of bed to a wheelchair or walker to go to the bathroom, but on waking up this morning, she was unable to lift legs off the bed.  She thought that this may be related to her multiple sclerosis.  EMS was activated and patient was sent to the ED for further evaluation and management.  She denies fever, chills, headache, blurry vision, nausea, vomiting, chest pain or shortness of breath.   Work up in ED was negative for infection. Neurology was consulted and recommended to start IV solumedrol and transfer to [facility] hospital for neurology evaluation.   2/12: Patient seen in the emergency department today.  She denies any fevers, chills, shortness of breath, cough, nausea, vomiting, diarrhea, dysuria.  She was in normal state of health until she woke up yesterday with weakness.  She is typically wheelchair-bound but able to transfer herself.  When she woke up, she was unable to lift her legs.  Assessment and Plan: * Exacerbation of multiple sclerosis (Faith [name])- (present on admission) Without evidence of any underlying infection Continue home medications including baclofen, Valium, Neurontin, Norco Continue Solu-Medrol 1 g daily for 5-day course MRI unable to be obtained at [facility] as MRI is unavailable over the weekend Neurology consulted at [facility]   Bilateral leg weakness- (present on admission) Due to MS exacerbation She is wheelchair-bound at baseline  GERD (gastroesophageal reflux disease)- (present on admission) Continue  Protonix  Leukocytosis- (present on admission) Reactive versus hemoconcentration Improving Without any evidence of underlying infection Blood cultures are pending  Hyperglycemia Reactive  Screening for lung cancer CT  Generalized weakness Due to MS  Sacral decubitus ulcer, stage IV ([place])- (present on admission) Present on admission WOC consult  Essential hypertension Controlled  Tremor- (present on admission) Continue propranolol          Physical Exam: Vitals:   04/12/21 0800 04/12/21 0815 04/12/21 0830 04/12/21 0845  BP: 113/67  116/72   Pulse:      Resp: 16 (!) 28 15 16   Temp:      SpO2:      Weight:      Height:       Examination: General exam: Appears calm and comfortable  Respiratory system: Clear to auscultation. Respiratory effort normal. Cardiovascular system: S1 & S2 heard, RRR. No pedal edema. Gastrointestinal system: Abdomen is nondistended, soft and nontender. Normal bowel sounds heard. Central nervous system: Alert and oriented. +bilateral LE weakness 3/5  Extremities: Symmetric in appearance bilaterally  Skin: No rashes, lesions or ulcers on exposed skin  Psychiatry: Judgement and insight appear stable. Mood & affect appropriate.    Data Reviewed:  WBC 12.7, UA negative, Covid/flu negative.   Family Communication: none at bedside   Disposition: Status is: Inpatient Remains inpatient appropriate because: remains on IV solumedrol for 5 day course, neurology consult at [facility] hospital.          Planned Discharge Destination: Home      Author: Dessa Phi, DO 04/12/2021 9:56 AM  For on call review [email or web address].

## 2021-04-13 DIAGNOSIS — G35 Multiple sclerosis: Secondary | ICD-10-CM | POA: Diagnosis not present

## 2021-04-13 LAB — CBC
HCT: 30.6 % — ABNORMAL LOW (ref 36.0–46.0)
Hemoglobin: 10 g/dL — ABNORMAL LOW (ref 12.0–15.0)
MCH: 28.7 pg (ref 26.0–34.0)
MCHC: 32.7 g/dL (ref 30.0–36.0)
MCV: 87.7 fL (ref 80.0–100.0)
Platelets: 310 10*3/uL (ref 150–400)
RBC: 3.49 MIL/uL — ABNORMAL LOW (ref 3.87–5.11)
RDW: 15.4 % (ref 11.5–15.5)
WBC: 21.7 10*3/uL — ABNORMAL HIGH (ref 4.0–10.5)
nRBC: 0 % (ref 0.0–0.2)

## 2021-04-13 LAB — BASIC METABOLIC PANEL
Anion gap: 9 (ref 5–15)
BUN: 22 mg/dL (ref 8–23)
CO2: 27 mmol/L (ref 22–32)
Calcium: 9 mg/dL (ref 8.9–10.3)
Chloride: 103 mmol/L (ref 98–111)
Creatinine, Ser: 0.61 mg/dL (ref 0.44–1.00)
GFR, Estimated: >60 mL/min (ref >60)
Glucose, Bld: 149 mg/dL — ABNORMAL HIGH (ref 70–99)
Potassium: 3.7 mmol/L (ref 3.5–5.1)
Sodium: 139 mmol/L (ref 135–145)

## 2021-04-13 LAB — MRSA NEXT GEN BY PCR, NASAL: MRSA by PCR Next Gen: NOT DETECTED

## 2021-04-13 MED ORDER — SODIUM CHLORIDE 0.9 % IV SOLN: INTRAVENOUS | Status: DC | PRN

## 2021-04-13 NOTE — Progress Notes (Signed)
PROGRESS NOTE  ARSENIA GORACKE  JIR:[id number] DOB: 1955-07-19 DOA: 04/11/2021 PCP: Janifer Adie, MD   Brief Narrative: RINOA GARRAMONE is a 66 y.o. female with medical history significant of COPD, hypertension, multiple sclerosis, wheelchair dependent, pressure ulcer in the right buttock POA who presents to the emergency department via EMS due to weakness at [facility].  Patient stated that she usually can get out of bed to a wheelchair or walker to go to the bathroom, but  she was unable to lift legs off the bed.  She thought that this may be related to her multiple sclerosis.  EMS was activated and patient was sent to the ED for further evaluation and management.  Neurology was consulted and recommended to start IV solumedrol and transfer to [facility] hospital for neurology evaluation. Plan for 5 days course.PT recommended outpatient follow up.    Assessment & Plan:  Principal Problem:   Exacerbation of multiple sclerosis ([place]) Active Problems:   Tremor   Bilateral leg weakness   Sacral decubitus ulcer, stage IV (HCC)   Leukocytosis   GERD (gastroesophageal reflux disease)   Assessment and Plan: * Exacerbation of multiple sclerosis ([place])- (present on admission) Without evidence of any underlying infection Continue home medications including baclofen, Valium, Neurontin, Norco Continue Solu-Medrol 1 g daily for 5-day course, day 3/5 Neurology was consulted. MRI showed Extensive patchy cord signal abnormality throughout the thoracic spinal cord, consistent with history of chronic demyelinating disease/multiple sclerosis.  No new evidence of demyelination     GERD (gastroesophageal reflux disease)- (present on admission) Continue Protonix  Leukocytosis- (present on admission) Likely from steroids.  Low suspicious for infection etiology.  Blood cultures have not shown any growth  Sacral decubitus ulcer, stage IV (H[facility])- (present on admission) Present on admission WOC  consulted  Essential hypertension Controlled  Bilateral leg weakness- (present on admission) Due to MS exacerbation She is wheelchair-bound at baseline.PT recommended outpatient follow-up  Tremor- (present on admission) Continue propranolol   Hyperglycemia-resolved as of 04/13/2021 Reactive  Screening for lung cancer-resolved as of 04/13/2021 CT         DVT prophylaxis:enoxaparin (LOVENOX) injection 40 mg Start: 04/12/21 1000 SCDs Start: 04/12/21 0519     Code Status: Full Code  Family Communication: Called and discussed with daughter on phone on 2/13   Patient status:Inpatient  Patient is from :Home  Anticipated discharge BP:ZWCH  Estimated DC date:day after tomorrow   Consultants: neurology  Procedures:None  Antimicrobials:  Anti-infectives (From admission, onward)    None       Subjective: Patient seen and examined at the bedside this morning.  Hemodynamically stable.  Very deconditioned, chronically ill looking, weak, lying in bed.  She is feels that her bilateral lower extremity strength have improved since admission.  Denies any new complaints.  Objective: Vitals:   04/12/21 2342 04/13/21 0337 04/13/21 0821 04/13/21 1208  BP: 135/63 118/67 108/77 113/71  Pulse: 100 95 85 77  Resp: 18 17 18 18   Temp: 98 F (36.7 C) 98.4 F (36.9 C) 97.6 F (36.4 C) (!) 97.3 F (36.3 C)  TempSrc: Oral Oral Oral Oral  SpO2: 98% 98%    Weight:      Height:        Intake/Output Summary (Last 24 hours) at 04/13/2021 1331 Last data filed at 04/13/2021 1314 Gross per 24 hour  Intake --  Output 550 ml  Net -550 ml   Filed Weights   04/11/21 1924  Weight: 46.3 kg  Examination:  General exam: Very deconditioned, chronically ill looking HEENT: PERRL Respiratory system:  no wheezes or crackles  Cardiovascular system: S1 & S2 heard, RRR.  Gastrointestinal system: Abdomen is nondistended, soft and nontender. Central nervous system: Alert and oriented,  bilateral lower extremity weakness Extremities: No edema, no clubbing ,no cyanosis Skin: No rashes, no icterus   Pressure Injury 03/13/20 Buttocks Right Unstageable - Full thickness tissue loss in which the base of the injury is covered by slough (yellow, tan, gray, green or [place]) and/or eschar (tan, General or black) in the wound bed. (Active)  03/13/20 1800  Location: Buttocks  Location Orientation: Right  Staging: Unstageable - Full thickness tissue loss in which the base of the injury is covered by slough (yellow, tan, gray, green or Naim) and/or eschar (tan, [place] or black) in the wound bed.  Wound Description (Comments):   Present on Admission: Yes     Pressure Injury 02/15/21 Sacrum Medial Stage 4 - Full thickness tissue loss with exposed bone, tendon or muscle. 4cm x 4 cm x 2.5cm (Active)  02/15/21 0420  Location: Sacrum  Location Orientation: Medial  Staging: Stage 4 - Full thickness tissue loss with exposed bone, tendon or muscle.  Wound Description (Comments): 4cm x 4 cm x 2.5cm  Present on Admission: Yes         Data Reviewed: I have personally reviewed following labs and imaging studies  CBC: Recent Labs  Lab 04/11/21 1933 04/12/21 0546 04/13/21 0038  WBC 16.4* 12.7* 21.7*  NEUTROABS 14.4*  --   --   HGB 12.3 10.6* 10.0*  HCT 39.2 33.5* 30.6*  MCV 91.2 89.8 87.7  PLT 331 287 280   Basic Metabolic Panel: Recent Labs  Lab 04/11/21 1933 04/12/21 0546 04/13/21 0038  NA 137 139 139  K 3.9 3.7 3.7  CL 101 111 103  CO2 28 24 27   GLUCOSE 137* 149* 149*  BUN 15 13 22   CREATININE 0.56 0.56 0.61  CALCIUM 9.4 8.5* 9.0  MG  --  1.8  --   PHOS  --  3.2  --      Recent Results (from the past 240 hour(s))  Resp Panel by RT-PCR (Flu A&B, Covid) Nasopharyngeal Swab     Status: None   Collection Time: 04/11/21  8:57 PM   Specimen: Nasopharyngeal Swab; Nasopharyngeal(NP) swabs in vial transport medium  Result Value Ref Range Status   SARS Coronavirus 2 by RT  PCR NEGATIVE NEGATIVE Final    Comment: (NOTE) SARS-CoV-2 target nucleic acids are NOT DETECTED.  The SARS-CoV-2 RNA is generally detectable in upper respiratory specimens during the acute phase of infection. The lowest concentration of SARS-CoV-2 viral copies this assay can detect is 138 copies/mL. A negative result does not preclude SARS-Cov-2 infection and should not be used as the sole basis for treatment or other patient management decisions. A negative result may occur with  improper specimen collection/handling, submission of specimen other than nasopharyngeal swab, presence of viral mutation(s) within the areas targeted by this assay, and inadequate number of viral copies(<138 copies/mL). A negative result must be combined with clinical observations, patient history, and epidemiological information. The expected result is Negative.  Fact Sheet for Patients:  EntrepreneurPulse.com.au  Fact Sheet for Healthcare Providers:  IncredibleEmployment.be  This test is no t yet approved or cleared by the [place] FDA and  has been authorized for detection and/or diagnosis of SARS-CoV-2 by FDA under an Emergency Use Authorization (EUA). This EUA will remain  in effect (meaning this test can be used) for the duration of the COVID-19 declaration under Section 564(b)(1) of the Act, 21 U.S.C.section 360bbb-3(b)(1), unless the authorization is terminated  or revoked sooner.       Influenza A by PCR NEGATIVE NEGATIVE Final   Influenza B by PCR NEGATIVE NEGATIVE Final    Comment: (NOTE) The Xpert Xpress SARS-CoV-2/FLU/RSV plus assay is intended as an aid in the diagnosis of influenza from Nasopharyngeal swab specimens and should not be used as a sole basis for treatment. Nasal washings and aspirates are unacceptable for Xpert Xpress SARS-CoV-2/FLU/RSV testing.  Fact Sheet for Patients: EntrepreneurPulse.com.au  Fact Sheet for  Healthcare Providers: IncredibleEmployment.be  This test is not yet approved or cleared by the [place] FDA and has been authorized for detection and/or diagnosis of SARS-CoV-2 by FDA under an Emergency Use Authorization (EUA). This EUA will remain in effect (meaning this test can be used) for the duration of the COVID-19 declaration under Section 564(b)(1) of the Act, 21 U.S.C. section 360bbb-3(b)(1), unless the authorization is terminated or revoked.  Performed at [facility], [address] [place], [place] [address]   Blood culture (routine x 2)     Status: None (Preliminary result)   Collection Time: 04/11/21  9:14 PM   Specimen: Right Antecubital; Blood  Result Value Ref Range Status   Specimen Description RIGHT ANTECUBITAL  Final   Special Requests   Final    BOTTLES DRAWN AEROBIC AND ANAEROBIC Blood Culture adequate volume   Culture   Final    NO GROWTH 2 DAYS Performed at [facility], [address], [place], [place] [address]    Report Status PENDING  Incomplete  Blood culture (routine x 2)     Status: None (Preliminary result)   Collection Time: 04/11/21  9:15 PM   Specimen: Left Antecubital; Blood  Result Value Ref Range Status   Specimen Description LEFT ANTECUBITAL  Final   Special Requests   Final    BOTTLES DRAWN AEROBIC AND ANAEROBIC Blood Culture adequate volume   Culture   Final    NO GROWTH 2 DAYS Performed at [facility], [address], [place], [place] [address]    Report Status PENDING  Incomplete  MRSA Next Gen by PCR, Nasal     Status: None   Collection Time: 04/13/21  3:36 AM   Specimen: Nasal Mucosa; Nasal Swab  Result Value Ref Range Status   MRSA by PCR Next Gen NOT DETECTED NOT DETECTED Final    Comment: (NOTE) The GeneXpert MRSA Assay (FDA approved for NASAL specimens only), is one component of a comprehensive MRSA colonization surveillance program. It is not intended to diagnose MRSA infection nor to guide or  monitor treatment for MRSA infections. Test performance is not FDA approved in patients less than 4 years old. Performed at [facility], [place] [address], [place],  [address]      Radiology Studies: MR THORACIC SPINE W WO CONTRAST  Result Date: 04/12/2021 CLINICAL DATA:  Initial evaluation for acute lower extremity weakness, history of multiple sclerosis. EXAM: MRI THORACIC WITHOUT AND WITH CONTRAST TECHNIQUE: Multiplanar and multiecho pulse sequences of the thoracic spine were obtained without and with intravenous contrast. CONTRAST:  4.50mL GADAVIST GADOBUTROL 1 MMOL/ML IV SOLN COMPARISON:  Previous MRI from 03/01/2020. FINDINGS: Alignment:  Examination mildly degraded by motion artifact. Mild dextroscoliosis. Alignment otherwise normal preservation of the normal thoracic kyphosis. No listhesis. Vertebrae: Vertebral body height maintained without acute or chronic fracture.  Bone marrow signal intensity within normal limits. Benign hemangioma noted within the T4 vertebral body. No worrisome osseous lesions. No abnormal marrow edema or enhancement. Cord: Again seen is fairly extensive patchy cord signal abnormality throughout the thoracic spinal cord, extending from the cervicothoracic region to approximately T10-11 most obvious of these lesions seen involving the right dorsal cord at the level of T4 (series 18, image 12). Few scattered areas of associated spinal cord atrophy/volume loss. No visible cord expansion, edema, or enhancement to suggest active demyelination. In comparison with previous exam, overall appearance is grossly similar, with no convincing evidence of significant disease progression. Paraspinal and other soft tissues: Paraspinous soft tissues demonstrate no acute finding. Partially visualized lungs are grossly clear. Atheromatous change noted within the visualized intrathoracic aorta. Disc levels: T7-8: Small central disc protrusion indents the ventral thecal sac (series 18,  image 23). No significant spinal stenosis or frank cord deformity. Foramina remain patent. Otherwise, no other significant disc pathology seen elsewhere within the thoracic spine for age. No other focal disc herniation. Scattered foraminal stenosis about the cervicothoracic junction is similar to previous. No new or progressive findings. IMPRESSION: 1. Extensive patchy cord signal abnormality throughout the thoracic spinal cord, consistent with history of chronic demyelinating disease/multiple sclerosis. Overall, appearance is grossly similar as compared to 03/01/2020, with no evidence of significant disease progression. No abnormal enhancement to suggest active demyelination. 2. Underlying mild for age thoracic spondylosis without significant spinal stenosis. Electronically Signed   By: Jeannine Boga M.D.   On: 04/12/2021 23:23   MR Lumbar Spine W Wo Contrast  Result Date: 04/12/2021 CLINICAL DATA:  Initial evaluation for acute lower extremity weakness, history of multiple sclerosis. EXAM: MRI LUMBAR SPINE WITHOUT AND WITH CONTRAST TECHNIQUE: Multiplanar and multiecho pulse sequences of the lumbar spine were obtained without and with intravenous contrast. CONTRAST:  4.63mL GADAVIST GADOBUTROL 1 MMOL/ML IV SOLN COMPARISON:  Prior MRI from 03/01/2020. FINDINGS: Segmentation: Standard. Lowest well-formed disc space labeled the L5-S1 level. Alignment: Mild levoscoliosis. Trace facet mediated retrolisthesis of L2 on L3, with trace anterolisthesis of L3 on L4. Vertebrae: Vertebral body height maintained without acute or chronic fracture. Bone marrow signal intensity within normal limits. No discrete or worrisome osseous lesions. No abnormal marrow edema or enhancement. Conus medullaris and cauda equina: Conus extends to the T12 level. Conus and cauda equina appear normal. No abnormal enhancement. Paraspinal and other soft tissues: Mild edema and enhancement seen within the subcutaneous fat of the lower  back/gluteal region, likely related to known history of decubitus ulcer. No loculated or drainable fluid collections visible. No visible evidence for osteomyelitis within the underlying sacrum. Paraspinous soft tissues demonstrate no other acute finding. Subcentimeter benign appearing cyst noted within the left kidney. Visualized visceral structures otherwise unremarkable. Disc levels: L1-2: Normal interspace. Mild bilateral facet hypertrophy. No canal or foraminal stenosis. L2-3: Degenerative intervertebral disc space narrowing with diffuse disc bulge and disc desiccation, eccentric to the right. Associated reactive endplate spurring. Mild to moderate bilateral facet hypertrophy with associated trace joint effusions. Moderate narrowing of the right lateral recess. Central canal remains patent. Mild right L2 foraminal stenosis. Left neural foramen remains patent. L3-4: Circumferential disc bulge with disc desiccation. Superimposed right foraminal to extraforaminal disc protrusion contacts the exiting right L3 nerve root (series 26, image 23). Moderate bilateral facet hypertrophy with associated trace joint effusions. Resultant mild canal with mild right greater than left lateral recess stenosis, descending L4 nerve root level. Mild right L3 foraminal stenosis. No significant left foraminal  encroachment. L4-5: Moderate intervertebral disc space narrowing with diffuse disc bulge, disc desiccation, and reactive endplate spurring. Moderate bilateral facet arthrosis, worse on the left. Mild to moderate bilateral lateral recess narrowing, also slightly worse on the left. Central canal remains patent. Moderate left with mild-to-moderate right L4 foraminal stenosis. L5-S1: Disc desiccation without significant disc bulge. Left-sided endplate spurring. Moderate bilateral facet arthrosis. No significant spinal stenosis. Mild left L5 foraminal narrowing. Right neural foramina remains patent. IMPRESSION: 1. Mild edema and  enhancement within the subcutaneous fat of the lower back/gluteal region, consistent with known history of decubitus ulcer. No loculated or drainable fluid collections visible. No evidence for osteomyelitis or other infection within the visualized underlying sacrum or lumbar spine. 2. Normal MRI appearance of the conus medullaris and nerve roots of the cauda equina. 3. Right eccentric disc bulge with facet hypertrophy at L2-3 with resultant moderate right lateral recess stenosis. 4. Right foraminal to extraforaminal disc protrusion at L3-4, contacting and potentially affecting the exiting right L3 nerve root. 5. Disc bulging with facet hypertrophy at L4-5 with resultant mild to moderate left greater than right lateral recess stenosis. Electronically Signed   By: Jeannine Boga M.D.   On: 04/12/2021 23:05   DG Chest Port 1 View  Result Date: 04/11/2021 CLINICAL DATA:  Weakness. EXAM: PORTABLE CHEST 1 VIEW COMPARISON:  Chest radiograph dated 02/14/2021 and CT dated 04/09/2021. FINDINGS: Emphysematous changes of the lungs. No focal consolidation, pleural effusion, or pneumothorax. The cardiac silhouette is within limits. Atherosclerotic calcification of the aorta. No acute osseous pathology. IMPRESSION: No active disease. Electronically Signed   By: Anner Crete M.D.   On: 04/11/2021 20:44    Scheduled Meds:  baclofen  10 mg Oral TID   celecoxib  200 mg Oral BID   enoxaparin (LOVENOX) injection  40 mg Subcutaneous Q24H   feeding supplement  237 mL Oral TID BM   gabapentin  600 mg Oral TID   pantoprazole  40 mg Oral Daily   polyethylene glycol  17 g Oral Daily   propranolol  20 mg Oral BID   Continuous Infusions:  sodium chloride 10 mL/hr at 04/13/21 1313   methylPREDNISolone (SOLU-MEDROL) injection 1,000 mg (04/13/21 1314)     LOS: 2 days   Shelly Coss, MD [facility] P2/13/2023, 1:31 PM

## 2021-04-13 NOTE — Consult Note (Addendum)
[place] Nurse Consult Note: Patient receiving care in [facility] [phone] Reason for Consult: Chronic sacral wound Wound type: Chronic Stage 4 sacral PI Pressure Injury POA: Yes Measurement: 6 x 4 x 4.5 undermining at 6 o'clock. Undermining at 12 o'clock 3 cm. 3 and 9 o'clock undermining of 2 cm. Epibole around edges of wound with minor maceration surrounding.  Wound bed: Pink, shiny red Drainage (amount, consistency, odor) sanguinous Dressing procedure/placement/frequency: Clean the sacral area with soap and water. Place a piece of Aquacel Advantage into the wound for absorption and then secure with sacral foam dressing. Change twice daily. Foam dressing may be used up to 3 days unless soiled. Order and place patient on standard air mattress  Monitor the wound area(s) for worsening of condition such as: Signs/symptoms of infection, increase in size, development of or worsening of odor, development of pain, or increased pain at the affected locations.   Notify the medical team if any of these develop.  Thank you for the consult. [place] nurse will not follow at this time.   Please re-consult the [place] team if needed.  Cathlean Marseilles Tamala Julian, MSN, RN, [place], [place]le Pearl, [facility] Wound Treatment Associate Pager [phone]

## 2021-04-13 NOTE — Evaluation (Signed)
Physical Therapy Evaluation Patient Details Name: Faith [name] MRN: [id number] DOB: 09/09/1955 Today's Date: 04/13/2021  History of Present Illness  Pt is a 66 y.o. female admitted 04/11/21 with worsening BLE weakness; workup for suspected MS exacerbation. Thoracic MRI without evidence of significant disease progression. Lumbar MRI with soft tissue edema at sacral ulcer; no findings to explain worsening LE weakness. PMH includes multiple sclerosis, COPD, HTN, anemia, arthritis, s/p cervical sx.   Clinical Impression  Pt presents with an overall decrease in functional mobility secondary to above. PTA, pt typically mod indep with transfers to/from electric scooter; lives with son who assists with ADL/iADLs; pt also has [facility]; [facility] goes to [facility] and works with PT there. Today, pt notes significant improvement in BLE strength (functionally at least 3/5); pt requiring up to minA for mobility. Pt would benefit from continued acute PT services to maximize functional mobility and independence prior to d/c with continued therapies at [facility].     Recommendations for follow up therapy are one component of a multi-disciplinary discharge planning process, led by the attending physician.  Recommendations may be updated based on patient status, additional functional criteria and insurance authorization.  Follow Up Recommendations Outpatient PT (at [facility])    Assistance Recommended at Discharge Intermittent Supervision/Assistance  Patient can return home with the following  A little help with walking and/or transfers;A little help with bathing/dressing/bathroom;Assistance with feeding;Assistance with cooking/housework;Assist for transportation;Help with stairs or ramp for entrance    Equipment Recommendations None recommended by PT  Recommendations for Other Services       Functional Status Assessment Patient has had a recent decline in their functional status and demonstrates the  ability to make significant improvements in function in a reasonable and predictable amount of time.     Precautions / Restrictions Precautions Precautions: Fall Restrictions Weight Bearing Restrictions: No      Mobility  Bed Mobility Overal bed mobility: Needs Assistance Bed Mobility: Supine to Sit     Supine to sit: Min assist, HOB elevated     General bed mobility comments: MinA for BLE management, pt assisting self to EOB well with use of bed rail    Transfers Overall transfer level: Needs assistance Equipment used: Rolling walker (2 wheels) Transfers: Sit to/from Stand, Bed to chair/wheelchair/BSC Sit to Stand: Min guard   Step pivot transfers: Min guard       General transfer comment: heavy reliance on BUE support to push to standing to RW, min guard for balance; pivotal steps to recliner with RW and min guard; noted knee hyperextension    Ambulation/Gait                  Stairs            Wheelchair Mobility    Modified Rankin (Stroke Patients Only)       Balance Overall balance assessment: Needs assistance   Sitting balance-Leahy Scale: Fair     Standing balance support: Reliant on assistive device for balance, During functional activity Standing balance-Leahy Scale: Poor                               Pertinent Vitals/Pain Pain Assessment Pain Assessment: Faces Faces Pain Scale: Hurts little more Pain Location: back Pain Descriptors / Indicators: Discomfort Pain Intervention(s): Monitored during session, Limited activity within patient's tolerance, Repositioned    Home Living Family/patient expects to be discharged to:: Private residence  Living Arrangements: Children Available Help at Discharge: Family;Personal care attendant;Available PRN/intermittently Type of Home: House Home Access: Ramped entrance       Home Layout: One level Home Equipment: Conservation officer, nature (2 wheels);Rollator (4  wheels);BSC/3in1;Wheelchair - power Additional Comments: [facility] and [facility]L assist; has daughter-in-law visit the other 2x/wk for sacral wound care. Lives with son who "is there almost all the time"    Prior Function Prior Level of Function : Needs assist             Mobility Comments: Typically mod indep with standing/step pivot transfers to/from electric scooter with rollator (prefers use of rollator with brakes locked). Sleeps in recliner, sometimes using a donut pillow due to sacral wound; reports frequent repositioning for pressure relief ADLs Comments: Reports aide assist for bathing, but has not had hair washed in ~6 wks. Uses BSC; wears pad daily "to catch bleeding from my wound"     Hand Dominance        Extremity/Trunk Assessment   Upper Extremity Assessment Upper Extremity Assessment: RUE deficits/detail;LUE deficits/detail RUE Deficits / Details: functionally at least 3/5 strength throughout; noted tremors with all mobility limiting ability to feed self (typically uses weights on wrists and weighted utensils at home) LUE Deficits / Details: functionally at least 3/5 strength throughout; noted tremors with all mobility limiting ability to feed self (typically uses weights on wrists and weighted utensils at home)    Lower Extremity Assessment Lower Extremity Assessment: RLE deficits/detail;LLE deficits/detail RLE Deficits / Details: Hip and knee 3+/5, limited ankle DF/PF (pt reports, "I've been working on that for a while"); pt denies numbness/tingling or decreased sensation LLE Deficits / Details: Hip and knee 3/5, limited ankle DF/PF (pt reports, "I've been working on that for a while"); pt denies numbness/tingling or decreased sensation    Cervical / Trunk Assessment Cervical / Trunk Assessment: Kyphotic  Communication   Communication: Expressive difficulties  Cognition Arousal/Alertness: Awake/alert Behavior During Therapy: WFL for  tasks assessed/performed, Flat affect Overall Cognitive Status: Within Functional Limits for tasks assessed                                          General Comments General comments (skin integrity, edema, etc.): NT aware pt requires assist to eat her breakfast tray    Exercises     Assessment/Plan    PT Assessment Patient needs continued PT services  PT Problem List Decreased strength;Decreased activity tolerance;Decreased balance;Decreased mobility;Decreased coordination;Pain;Decreased skin integrity       PT Treatment Interventions DME instruction;Gait training;Functional mobility training;Therapeutic activities;Therapeutic exercise;Balance training;Patient/family education;Wheelchair mobility training    PT Goals (Current goals can be found in the Care Plan section)  Acute Rehab PT Goals Patient Stated Goal: Return home with son's help, keep working with therapies at [facility] PT Goal Formulation: With patient Time For Goal Achievement: 04/27/21 Potential to Achieve Goals: Good    Frequency Min 3X/week     Co-evaluation               AM-PAC PT "6 Clicks" Mobility  Outcome Measure Help needed turning from your back to your side while in a flat bed without using bedrails?: A Little Help needed moving from lying on your back to sitting on the side of a flat bed without using bedrails?: A Little Help needed moving to and from a bed to a  chair (including a wheelchair)?: A Little Help needed standing up from a chair using your arms (e.g., wheelchair or bedside chair)?: A Little Help needed to walk in hospital room?: Total Help needed climbing 3-5 steps with a railing? : Total 6 Click Score: 14    End of Session Equipment Utilized During Treatment: Gait belt Activity Tolerance: Patient tolerated treatment well Patient left: in chair;with call bell/phone within reach;with chair alarm set Nurse Communication: Mobility status;Other (comment) (pt ready to  eat her breakfast tray) PT Visit Diagnosis: Other abnormalities of gait and mobility (R26.89);Muscle weakness (generalized) (M62.81);Other symptoms and signs involving the nervous system (R29.898)    Time: 8315-1761 PT Time Calculation (min) (ACUTE ONLY): 24 min   Charges:   PT Evaluation $PT Eval Moderate Complexity: 1 Mod        Mabeline Caras, PT, DPT Acute Rehabilitation Services  Pager [phone] Office [phone]  Derry Lory 04/13/2021, 11:05 AM

## 2021-04-13 NOTE — Evaluation (Signed)
Occupational Therapy Evaluation Patient Details Name: Faith [name] MRN: [id number] DOB: 1955-03-25 Today's Date: 04/13/2021   History of Present Illness Pt is a 66 y.o. female admitted 04/11/21 with worsening BLE weakness; workup for suspected MS exacerbation. Thoracic MRI without evidence of significant disease progression. Lumbar MRI with soft tissue edema at sacral ulcer; no findings to explain worsening LE weakness. PMH includes multiple sclerosis, COPD, HTN, anemia, arthritis, s/p cervical sx.   Clinical Impression   Pt presents with decline in function and safety with ADLs and ADL mobility with impaired strength, balance and endurance (pt with hx of MS). PTA pt lived at home with her son, has an aide 5 days/wk x2 hours for bathing and LB dressing assist. Pt used a scooter and was able to SPT from bed and to toilet. Pt currently requires min A with UB ADLs, min guard A with grooming, max A with toileting and total - max A with LB selfcare. Pt's son provided assist with grocery shopping, cooking and home mgt, pt gets Meals on Wheels.Pt also [facility] and works with therapy there.  Pt would benefit from acute OT services to maximize level of function and safety     Recommendations for follow up therapy are one component of a multi-disciplinary discharge planning process, led by the attending physician.  Recommendations may be updated based on patient status, additional functional criteria and insurance authorization.   Follow Up Recommendations  Outpatient OT (return to [facility] for therapies)    Assistance Recommended at Discharge Intermittent Supervision/Assistance  Patient can return home with the following A lot of help with bathing/dressing/bathroom;A little help with walking and/or transfers;Assistance with cooking/housework;Direct supervision/assist for medications management;Assist for transportation;Help with stairs or ramp for entrance    Functional Status Assessment   Patient has had a recent decline in their functional status and demonstrates the ability to make significant improvements in function in a reasonable and predictable amount of time.  Equipment Recommendations  None recommended by OT    Recommendations for Other Services       Precautions / Restrictions Precautions Precautions: Fall Restrictions Weight Bearing Restrictions: No      Mobility Bed Mobility Overal bed mobility: Needs Assistance Bed Mobility: Sit to Supine       Sit to supine: Mod assist   General bed mobility comments: pt in recliner upon arrival, assisted pt back to bed at end of session, mod A with LEs back onto bed    Transfers Overall transfer level: Needs assistance Equipment used: Rolling walker (2 wheels) Transfers: Sit to/from Stand, Bed to chair/wheelchair/BSC Sit to Stand: Min guard     Step pivot transfers: Min guard     General transfer comment: min guard for balance      Balance Overall balance assessment: Needs assistance Sitting-balance support: Single extremity supported, No upper extremity supported, Feet supported Sitting balance-Leahy Scale: Fair     Standing balance support: Reliant on assistive device for balance, During functional activity, Bilateral upper extremity supported Standing balance-Leahy Scale: Poor                             ADL either performed or assessed with clinical judgement   ADL Overall ADL's : Needs assistance/impaired Eating/Feeding: Set up;Independent   Grooming: Min guard;Wash/dry hands;Wash/dry face;Sitting   Upper Body Bathing: Minimal assistance   Lower Body Bathing: Maximal assistance   Upper Body Dressing : Minimal assistance   Lower  Body Dressing: Maximal assistance   Toilet Transfer: Min guard;Stand-pivot;Rolling walker (2 wheels)   Toileting- Clothing Manipulation and Hygiene: Maximal assistance       Functional mobility during ADLs: Min guard;Rolling walker (2  wheels) General ADL Comments: pt is max A with LB selfcare and toilet hygiene at baseline     Vision Baseline Vision/History: 1 Wears glasses Ability to See in Adequate Light: 1 Impaired (pt reports vision has been worsening over time) Patient Visual Report: No change from baseline       Perception     Praxis      Pertinent Vitals/Pain Pain Assessment Pain Assessment: Faces Faces Pain Scale: Hurts little more Pain Location: back Pain Descriptors / Indicators: Discomfort Pain Intervention(s): Monitored during session, Repositioned, Limited activity within patient's tolerance     Hand Dominance Right   Extremity/Trunk Assessment Upper Extremity Assessment Upper Extremity Assessment: Generalized weakness;RUE deficits/detail;LUE deficits/detail RUE Deficits / Details: functionally at least 3/5 strength throughout; noted tremors with all mobility limiting ability to feed self (typically uses weights on wrists and weighted utensils at home) LUE Deficits / Details: functionally at least 3/5 strength throughout; noted tremors with all mobility limiting ability to feed self (typically uses weights on wrists and weighted utensils at home)   Lower Extremity Assessment Lower Extremity Assessment: Defer to PT evaluation RLE Deficits / Details: Hip and knee 3+/5, limited ankle DF/PF (pt reports, "I've been working on that for a while"); pt denies numbness/tingling or decreased sensation LLE Deficits / Details: Hip and knee 3/5, limited ankle DF/PF (pt reports, "I've been working on that for a while"); pt denies numbness/tingling or decreased sensation   Cervical / Trunk Assessment Cervical / Trunk Assessment: Kyphotic   Communication Communication Communication: Expressive difficulties   Cognition Arousal/Alertness: Awake/alert Behavior During Therapy: WFL for tasks assessed/performed, Flat affect Overall Cognitive Status: Within Functional Limits for tasks assessed                                        General Comments  NT aware pt requires assist to eat her breakfast tray    Exercises     Shoulder Instructions      Home Living Family/patient expects to be discharged to:: Private residence Living Arrangements: Children Available Help at Discharge: Family;Personal care attendant;Available PRN/intermittently Type of Home: House Home Access: Ramped entrance     Home Layout: One level     Bathroom Shower/Tub: Walk-in shower;Tub/shower unit   Bathroom Toilet: Standard     Home Equipment: Conservation officer, nature (2 wheels);Rollator (4 wheels);BSC/3in1;Wheelchair - power;Adaptive equipment Adaptive Equipment: Reacher Additional Comments: [facility] and ADL assist; has daughter-in-law visit the other 2x/wk for sacral wound care. Lives with son who "is there almost all the time"      Prior Functioning/Environment Prior Level of Function : Needs assist             Mobility Comments: Typically mod indep with standing/step pivot transfers to/from electric scooter with rollator (prefers use of rollator with brakes locked). Sleeps in recliner, sometimes using a donut pillow due to sacral wound; reports frequent repositioning for pressure relief ADLs Comments: Reports aide assist for bathing and dressing, but has not had hair washed in ~6 wks. Uses BSC; wears pad daily "to catch bleeding from my wound"        OT Problem List: Decreased strength;Impaired balance (sitting and/or  standing);Pain;Decreased activity tolerance;Decreased coordination;Decreased knowledge of use of DME or AE      OT Treatment/Interventions: Self-care/ADL training;Patient/family education;Balance training;Therapeutic activities;DME and/or AE instruction    OT Goals(Current goals can be found in the care plan section) Acute Rehab OT Goals Patient Stated Goal: go home OT Goal Formulation: With patient Time For Goal Achievement: 04/27/21 Potential to  Achieve Goals: Fair ADL Goals Pt Will Perform Grooming: with supervision;with set-up;sitting Pt Will Perform Upper Body Bathing: with min guard assist;with supervision;with set-up;sitting Pt Will Perform Upper Body Dressing: with min guard assist;with supervision;with set-up;sitting Pt Will Transfer to Toilet: with supervision;stand pivot transfer;bedside commode  OT Frequency: Min 2X/week    Co-evaluation              AM-PAC OT "6 Clicks" Daily Activity     Outcome Measure Help from another person eating meals?: None Help from another person taking care of personal grooming?: A Little Help from another person toileting, which includes using toliet, bedpan, or urinal?: A Lot Help from another person bathing (including washing, rinsing, drying)?: A Lot Help from another person to put on and taking off regular upper body clothing?: A Little Help from another person to put on and taking off regular lower body clothing?: Total 6 Click Score: 15   End of Session Equipment Utilized During Treatment: Gait belt;Rolling walker (2 wheels)  Activity Tolerance: Patient limited by fatigue;Patient limited by pain Patient left: in bed;with call bell/phone within reach;with bed alarm set  OT Visit Diagnosis: Unsteadiness on feet (R26.81);Other abnormalities of gait and mobility (R26.89);Muscle weakness (generalized) (M62.81)                Time: 3875-6433 OT Time Calculation (min): 23 min Charges:  OT General Charges $OT Visit: 1 Visit OT Evaluation $OT Eval Moderate Complexity: 1 Mod OT Treatments $Therapeutic Activity: 8-22 mins    Britt Bottom 04/13/2021, 1:40 PM

## 2021-04-13 NOTE — TOC Progression Note (Signed)
Transition of Care [facility]) - Progression Note    Patient Details  Name: Faith [name] MRN: [id number] Date of Birth: [birth year]  Transition of Care [facility]) CM/SW [place], LCSW Phone Number: 04/13/2021, 11:14 AM  Clinical Narrative:    CSW received call from [facility] with [place] [phone]). She will be able to provide transport for patient at discharge.        Expected Discharge Plan and Services                                                 Social Determinants of Health (SDOH) Interventions    Readmission Risk Interventions Readmission Risk Prevention Plan 02/16/2021 12/12/2020 03/31/2020  Post Dischage Appt - Complete Complete  Medication Screening - Complete Complete  Transportation Screening Complete Complete Complete  Home Care Screening Complete - -  Medication Review (RN CM) Complete - -  Some recent data might be hidden

## 2021-04-14 DIAGNOSIS — G35 Multiple sclerosis: Secondary | ICD-10-CM | POA: Diagnosis not present

## 2021-04-14 LAB — BASIC METABOLIC PANEL
Anion gap: 10 (ref 5–15)
BUN: 34 mg/dL — ABNORMAL HIGH (ref 8–23)
CO2: 25 mmol/L (ref 22–32)
Calcium: 8.9 mg/dL (ref 8.9–10.3)
Chloride: 104 mmol/L (ref 98–111)
Creatinine, Ser: 0.72 mg/dL (ref 0.44–1.00)
GFR, Estimated: >60 mL/min (ref >60)
Glucose, Bld: 170 mg/dL — ABNORMAL HIGH (ref 70–99)
Potassium: 3.8 mmol/L (ref 3.5–5.1)
Sodium: 139 mmol/L (ref 135–145)

## 2021-04-14 LAB — CBC WITH DIFFERENTIAL/PLATELET
Abs Immature Granulocytes: 0.14 10*3/uL — ABNORMAL HIGH (ref 0.00–0.07)
Basophils Absolute: 0 10*3/uL (ref 0.0–0.1)
Basophils Relative: 0 %
Eosinophils Absolute: 0 10*3/uL (ref 0.0–0.5)
Eosinophils Relative: 0 %
HCT: 31.9 % — ABNORMAL LOW (ref 36.0–46.0)
Hemoglobin: 10.2 g/dL — ABNORMAL LOW (ref 12.0–15.0)
Immature Granulocytes: 1 %
Lymphocytes Relative: 2 %
Lymphs Abs: 0.5 10*3/uL — ABNORMAL LOW (ref 0.7–4.0)
MCH: 28.6 pg (ref 26.0–34.0)
MCHC: 32 g/dL (ref 30.0–36.0)
MCV: 89.4 fL (ref 80.0–100.0)
Monocytes Absolute: 0.4 10*3/uL (ref 0.1–1.0)
Monocytes Relative: 2 %
Neutro Abs: 21.3 10*3/uL — ABNORMAL HIGH (ref 1.7–7.7)
Neutrophils Relative %: 95 %
Platelets: 313 10*3/uL (ref 150–400)
RBC: 3.57 MIL/uL — ABNORMAL LOW (ref 3.87–5.11)
RDW: 15.5 % (ref 11.5–15.5)
WBC: 22.3 10*3/uL — ABNORMAL HIGH (ref 4.0–10.5)
nRBC: 0 % (ref 0.0–0.2)

## 2021-04-14 LAB — HIV ANTIBODY (ROUTINE TESTING W REFLEX): HIV Screen 4th Generation wRfx: NONREACTIVE

## 2021-04-14 NOTE — TOC Progression Note (Signed)
Transition of Care [facility]) - Progression Note    Patient Details  Name: Faith [name] MRN: [id number] Date of Birth: September 12, 1955  Transition of Care [facility]) CM/SW [place], LCSW Phone Number: 04/14/2021, 1:47 PM  Clinical Narrative:    CSW provided update to [facility] with [facility]. She confirmed that patient receives wound care at [facility] and her daughter changes dressings the rest of the time. At discharge, Estill Bamberg [phone]) can contact [facility] transportation to pick patient up.    Expected Discharge Plan: Home/Self Care Barriers to Discharge: Continued Medical Work up  Expected Discharge Plan and Services Expected Discharge Plan: Home/Self Care     Post Acute Care Choice: Resumption of Svcs/PTA Provider ([facility]) Living arrangements for the past 2 months: Single Family Home                                       Social Determinants of Health (SDOH) Interventions    Readmission Risk Interventions Readmission Risk Prevention Plan 02/16/2021 12/12/2020 03/31/2020  Post Dischage Appt - Complete Complete  Medication Screening - Complete Complete  Transportation Screening Complete Complete Complete  Home Care Screening Complete - -  Medication Review (RN CM) Complete - -  Some recent data might be hidden

## 2021-04-14 NOTE — Progress Notes (Signed)
PROGRESS NOTE  Faith [name]  BTD:[id number] DOB: 01-19-1956 DOA: 04/11/2021 PCP: Janifer Adie, MD   Brief Narrative: Faith [name] is a 66 y.o. female with medical history significant of COPD, hypertension, multiple sclerosis, wheelchair dependent, pressure ulcer in the right buttock POA who presents to the emergency department via EMS due to weakness at [facility].  Patient stated that she usually can get out of bed to a wheelchair or walker to go to the bathroom, but  she was unable to lift legs off the bed.  She thought that this may be related to her multiple sclerosis.  EMS was activated and patient was sent to the ED for further evaluation and management.  Neurology was consulted and recommended to start IV solumedrol and transfer to [facility] hospital for neurology evaluation. Plan for 5 days course.day 4/5.PT/OT  recommended outpatient follow up.  Discharge planning to home with home tomorrow after she completes 5 days course of Solu-Medrol    Assessment & Plan:  Principal Problem:   Exacerbation of multiple sclerosis (HCC) Active Problems:   Tremor   Bilateral leg weakness   Sacral decubitus ulcer, stage IV (HCC)   Leukocytosis   GERD (gastroesophageal reflux disease)   Assessment and Plan: * Exacerbation of multiple sclerosis ([place])- (present on admission) Without evidence of any underlying infection Continue home medications including baclofen, Valium, Neurontin, Norco Continue Solu-Medrol 1 g daily for 5-day course, day 4/5 Neurology was consulted. MRI showed Extensive patchy cord signal abnormality throughout the thoracic spinal cord, consistent with history of chronic demyelinating disease/multiple sclerosis.  No new evidence of demyelination     GERD (gastroesophageal reflux disease)- (present on admission) Continue Protonix  Leukocytosis- (present on admission) Likely from steroids.  Low suspicious for infection etiology.  Blood cultures have not shown any  growth  Sacral decubitus ulcer, stage IV (HCC)- (present on admission) Present on admission WOC consulted  Essential hypertension Controlled  Bilateral leg weakness- (present on admission) Due to MS exacerbation She is wheelchair-bound at baseline.PT recommended outpatient follow-up  Tremor- (present on admission) Continue propranolol   Hyperglycemia-resolved as of 04/13/2021 Reactive  Screening for lung cancer-resolved as of 04/13/2021 CT         DVT prophylaxis:enoxaparin (LOVENOX) injection 40 mg Start: 04/12/21 1000 SCDs Start: 04/12/21 0519     Code Status: Full Code  Family Communication: Called and discussed with daughter on phone on 2/14   Patient status:Inpatient  Patient is from :Home  Anticipated discharge TG:GYIR  Estimated DC date: tomorrow   Consultants: neurology  Procedures:None  Antimicrobials:  Anti-infectives (From admission, onward)    None       Subjective: Patient seen and examined at the bedside this morning.  Hemodynamically stable.  She had looked comfortable, without any new complaints.  Strength on bilateral lower extremities have improved.  Objective: Vitals:   04/14/21 0043 04/14/21 0400 04/14/21 0805 04/14/21 1140  BP: 126/76 127/82 (!) 145/86 124/75  Pulse: 87 86 79 76  Resp: 20 20 20 20   Temp: 97.8 F (36.6 C) 97.9 F (36.6 C) 97.9 F (36.6 C) 98 F (36.7 C)  TempSrc: Oral Oral Oral Oral  SpO2:  94%    Weight:      Height:        Intake/Output Summary (Last 24 hours) at 04/14/2021 1200 Last data filed at 04/14/2021 0923 Gross per 24 hour  Intake 352.58 ml  Output 900 ml  Net -547.42 ml   Filed Weights   04/11/21  1924  Weight: 46.3 kg    Examination:  General exam: Overall comfortable, not in distress, very deconditioned, chronically looking HEENT: PERRL Respiratory system:  no wheezes or crackles  Cardiovascular system: S1 & S2 heard, RRR.  Gastrointestinal system: Abdomen is nondistended, soft  and nontender. Central nervous system: Alert and oriented, bilateral lower extremity weakness Extremities: No edema, no clubbing ,no cyanosis Skin: No rashes, no ulcers,no icterus   Pressure Injury 03/13/20 Buttocks Right Unstageable - Full thickness tissue loss in which the base of the injury is covered by slough (yellow, tan, gray, green or [place]) and/or eschar (tan, Sumners or black) in the wound bed. (Active)  03/13/20 1800  Location: Buttocks  Location Orientation: Right  Staging: Unstageable - Full thickness tissue loss in which the base of the injury is covered by slough (yellow, tan, gray, green or Matsen) and/or eschar (tan, Kreitz or black) in the wound bed.  Wound Description (Comments):   Present on Admission: Yes     Pressure Injury 02/15/21 Sacrum Medial Stage 4 - Full thickness tissue loss with exposed bone, tendon or muscle. 4cm x 4 cm x 2.5cm (Active)  02/15/21 0420  Location: Sacrum  Location Orientation: Medial  Staging: Stage 4 - Full thickness tissue loss with exposed bone, tendon or muscle.  Wound Description (Comments): 4cm x 4 cm x 2.5cm  Present on Admission: Yes         Data Reviewed: I have personally reviewed following labs and imaging studies  CBC: Recent Labs  Lab 04/11/21 1933 04/12/21 0546 04/13/21 0038 04/14/21 0033  WBC 16.4* 12.7* 21.7* 22.3*  NEUTROABS 14.4*  --   --  21.3*  HGB 12.3 10.6* 10.0* 10.2*  HCT 39.2 33.5* 30.6* 31.9*  MCV 91.2 89.8 87.7 89.4  PLT 331 287 310 993   Basic Metabolic Panel: Recent Labs  Lab 04/11/21 1933 04/12/21 0546 04/13/21 0038 04/14/21 0033  NA 137 139 139 139  K 3.9 3.7 3.7 3.8  CL 101 111 103 104  CO2 28 24 27 25   GLUCOSE 137* 149* 149* 170*  BUN 15 13 22  34*  CREATININE 0.56 0.56 0.61 0.72  CALCIUM 9.4 8.5* 9.0 8.9  MG  --  1.8  --   --   PHOS  --  3.2  --   --      Recent Results (from the past 240 hour(s))  Resp Panel by RT-PCR (Flu A&B, Covid) Nasopharyngeal Swab     Status: None    Collection Time: 04/11/21  8:57 PM   Specimen: Nasopharyngeal Swab; Nasopharyngeal(NP) swabs in vial transport medium  Result Value Ref Range Status   SARS Coronavirus 2 by RT PCR NEGATIVE NEGATIVE Final    Comment: (NOTE) SARS-CoV-2 target nucleic acids are NOT DETECTED.  The SARS-CoV-2 RNA is generally detectable in upper respiratory specimens during the acute phase of infection. The lowest concentration of SARS-CoV-2 viral copies this assay can detect is 138 copies/mL. A negative result does not preclude SARS-Cov-2 infection and should not be used as the sole basis for treatment or other patient management decisions. A negative result may occur with  improper specimen collection/handling, submission of specimen other than nasopharyngeal swab, presence of viral mutation(s) within the areas targeted by this assay, and inadequate number of viral copies(<138 copies/mL). A negative result must be combined with clinical observations, patient history, and epidemiological information. The expected result is Negative.  Fact Sheet for Patients:  EntrepreneurPulse.com.au  Fact Sheet for Healthcare Providers:  IncredibleEmployment.be  This  test is no t yet approved or cleared by the [place] and  has been authorized for detection and/or diagnosis of SARS-CoV-2 by FDA under an Emergency Use Authorization (EUA). This EUA will remain  in effect (meaning this test can be used) for the duration of the COVID-19 declaration under Section 564(b)(1) of the Act, 21 U.S.C.section 360bbb-3(b)(1), unless the authorization is terminated  or revoked sooner.       Influenza A by PCR NEGATIVE NEGATIVE Final   Influenza B by PCR NEGATIVE NEGATIVE Final    Comment: (NOTE) The Xpert Xpress SARS-CoV-2/FLU/RSV plus assay is intended as an aid in the diagnosis of influenza from Nasopharyngeal swab specimens and should not be used as a sole basis for treatment.  Nasal washings and aspirates are unacceptable for Xpert Xpress SARS-CoV-2/FLU/RSV testing.  Fact Sheet for Patients: EntrepreneurPulse.com.au  Fact Sheet for Healthcare Providers: IncredibleEmployment.be  This test is not yet approved or cleared by the [place] FDA and has been authorized for detection and/or diagnosis of SARS-CoV-2 by FDA under an Emergency Use Authorization (EUA). This EUA will remain in effect (meaning this test can be used) for the duration of the COVID-19 declaration under Section 564(b)(1) of the Act, 21 U.S.C. section 360bbb-3(b)(1), unless the authorization is terminated or revoked.  Performed at [facility], [address], [place], [place] [address]   Blood culture (routine x 2)     Status: None (Preliminary result)   Collection Time: 04/11/21  9:14 PM   Specimen: Right Antecubital; Blood  Result Value Ref Range Status   Specimen Description RIGHT ANTECUBITAL  Final   Special Requests   Final    BOTTLES DRAWN AEROBIC AND ANAEROBIC Blood Culture adequate volume   Culture   Final    NO GROWTH 2 DAYS Performed at [facility], [address], [place], [place] [address]    Report Status PENDING  Incomplete  Blood culture (routine x 2)     Status: None (Preliminary result)   Collection Time: 04/11/21  9:15 PM   Specimen: Left Antecubital; Blood  Result Value Ref Range Status   Specimen Description LEFT ANTECUBITAL  Final   Special Requests   Final    BOTTLES DRAWN AEROBIC AND ANAEROBIC Blood Culture adequate volume   Culture   Final    NO GROWTH 2 DAYS Performed at [facility], [address] [place], [place] [address]    Report Status PENDING  Incomplete  MRSA Next Gen by PCR, Nasal     Status: None   Collection Time: 04/13/21  3:36 AM   Specimen: Nasal Mucosa; Nasal Swab  Result Value Ref Range Status   MRSA by PCR Next Gen NOT DETECTED NOT DETECTED Final    Comment: (NOTE) The GeneXpert MRSA Assay (FDA  approved for NASAL specimens only), is one component of a comprehensive MRSA colonization surveillance program. It is not intended to diagnose MRSA infection nor to guide or monitor treatment for MRSA infections. Test performance is not FDA approved in patients less than 66 years old. Performed at [facility], [place] [address], [place], [place] [address]      Radiology Studies: MR THORACIC SPINE W WO CONTRAST  Result Date: 04/12/2021 CLINICAL DATA:  Initial evaluation for acute lower extremity weakness, history of multiple sclerosis. EXAM: MRI THORACIC WITHOUT AND WITH CONTRAST TECHNIQUE: Multiplanar and multiecho pulse sequences of the thoracic spine were obtained without and with intravenous contrast. CONTRAST:  4.68mL GADAVIST GADOBUTROL 1 MMOL/ML IV SOLN COMPARISON:  Previous MRI from 03/01/2020. FINDINGS: Alignment:  Examination mildly degraded by motion artifact. Mild dextroscoliosis. Alignment otherwise normal preservation of the normal thoracic kyphosis. No listhesis. Vertebrae: Vertebral body height maintained without acute or chronic fracture. Bone marrow signal intensity within normal limits. Benign hemangioma noted within the T4 vertebral body. No worrisome osseous lesions. No abnormal marrow edema or enhancement. Cord: Again seen is fairly extensive patchy cord signal abnormality throughout the thoracic spinal cord, extending from the cervicothoracic region to approximately T10-11 most obvious of these lesions seen involving the right dorsal cord at the level of T4 (series 18, image 12). Few scattered areas of associated spinal cord atrophy/volume loss. No visible cord expansion, edema, or enhancement to suggest active demyelination. In comparison with previous exam, overall appearance is grossly similar, with no convincing evidence of significant disease progression. Paraspinal and other soft tissues: Paraspinous soft tissues demonstrate no acute finding. Partially visualized lungs are  grossly clear. Atheromatous change noted within the visualized intrathoracic aorta. Disc levels: T7-8: Small central disc protrusion indents the ventral thecal sac (series 18, image 23). No significant spinal stenosis or frank cord deformity. Foramina remain patent. Otherwise, no other significant disc pathology seen elsewhere within the thoracic spine for age. No other focal disc herniation. Scattered foraminal stenosis about the cervicothoracic junction is similar to previous. No new or progressive findings. IMPRESSION: 1. Extensive patchy cord signal abnormality throughout the thoracic spinal cord, consistent with history of chronic demyelinating disease/multiple sclerosis. Overall, appearance is grossly similar as compared to 03/01/2020, with no evidence of significant disease progression. No abnormal enhancement to suggest active demyelination. 2. Underlying mild for age thoracic spondylosis without significant spinal stenosis. Electronically Signed   By: Jeannine Boga M.D.   On: 04/12/2021 23:23   MR Lumbar Spine W Wo Contrast  Result Date: 04/12/2021 CLINICAL DATA:  Initial evaluation for acute lower extremity weakness, history of multiple sclerosis. EXAM: MRI LUMBAR SPINE WITHOUT AND WITH CONTRAST TECHNIQUE: Multiplanar and multiecho pulse sequences of the lumbar spine were obtained without and with intravenous contrast. CONTRAST:  4.41mL GADAVIST GADOBUTROL 1 MMOL/ML IV SOLN COMPARISON:  Prior MRI from 03/01/2020. FINDINGS: Segmentation: Standard. Lowest well-formed disc space labeled the L5-S1 level. Alignment: Mild levoscoliosis. Trace facet mediated retrolisthesis of L2 on L3, with trace anterolisthesis of L3 on L4. Vertebrae: Vertebral body height maintained without acute or chronic fracture. Bone marrow signal intensity within normal limits. No discrete or worrisome osseous lesions. No abnormal marrow edema or enhancement. Conus medullaris and cauda equina: Conus extends to the T12 level.  Conus and cauda equina appear normal. No abnormal enhancement. Paraspinal and other soft tissues: Mild edema and enhancement seen within the subcutaneous fat of the lower back/gluteal region, likely related to known history of decubitus ulcer. No loculated or drainable fluid collections visible. No visible evidence for osteomyelitis within the underlying sacrum. Paraspinous soft tissues demonstrate no other acute finding. Subcentimeter benign appearing cyst noted within the left kidney. Visualized visceral structures otherwise unremarkable. Disc levels: L1-2: Normal interspace. Mild bilateral facet hypertrophy. No canal or foraminal stenosis. L2-3: Degenerative intervertebral disc space narrowing with diffuse disc bulge and disc desiccation, eccentric to the right. Associated reactive endplate spurring. Mild to moderate bilateral facet hypertrophy with associated trace joint effusions. Moderate narrowing of the right lateral recess. Central canal remains patent. Mild right L2 foraminal stenosis. Left neural foramen remains patent. L3-4: Circumferential disc bulge with disc desiccation. Superimposed right foraminal to extraforaminal disc protrusion contacts the exiting right L3 nerve root (series 26, image  23). Moderate bilateral facet hypertrophy with associated trace joint effusions. Resultant mild canal with mild right greater than left lateral recess stenosis, descending L4 nerve root level. Mild right L3 foraminal stenosis. No significant left foraminal encroachment. L4-5: Moderate intervertebral disc space narrowing with diffuse disc bulge, disc desiccation, and reactive endplate spurring. Moderate bilateral facet arthrosis, worse on the left. Mild to moderate bilateral lateral recess narrowing, also slightly worse on the left. Central canal remains patent. Moderate left with mild-to-moderate right L4 foraminal stenosis. L5-S1: Disc desiccation without significant disc bulge. Left-sided endplate spurring.  Moderate bilateral facet arthrosis. No significant spinal stenosis. Mild left L5 foraminal narrowing. Right neural foramina remains patent. IMPRESSION: 1. Mild edema and enhancement within the subcutaneous fat of the lower back/gluteal region, consistent with known history of decubitus ulcer. No loculated or drainable fluid collections visible. No evidence for osteomyelitis or other infection within the visualized underlying sacrum or lumbar spine. 2. Normal MRI appearance of the conus medullaris and nerve roots of the cauda equina. 3. Right eccentric disc bulge with facet hypertrophy at L2-3 with resultant moderate right lateral recess stenosis. 4. Right foraminal to extraforaminal disc protrusion at L3-4, contacting and potentially affecting the exiting right L3 nerve root. 5. Disc bulging with facet hypertrophy at L4-5 with resultant mild to moderate left greater than right lateral recess stenosis. Electronically Signed   By: Jeannine Boga M.D.   On: 04/12/2021 23:05    Scheduled Meds:  baclofen  10 mg Oral TID   celecoxib  200 mg Oral BID   enoxaparin (LOVENOX) injection  40 mg Subcutaneous Q24H   feeding supplement  237 mL Oral TID BM   gabapentin  600 mg Oral TID   pantoprazole  40 mg Oral Daily   polyethylene glycol  17 g Oral Daily   propranolol  20 mg Oral BID   Continuous Infusions:  sodium chloride 10 mL/hr at 04/13/21 1853   methylPREDNISolone (SOLU-MEDROL) injection Stopped (04/13/21 1415)     LOS: 3 days   Shelly Coss, MD [facility] P2/14/2023, 12:00 PM

## 2021-04-15 DIAGNOSIS — G35 Multiple sclerosis: Secondary | ICD-10-CM | POA: Diagnosis not present

## 2021-04-15 DIAGNOSIS — R29898 Other symptoms and signs involving the musculoskeletal system: Secondary | ICD-10-CM

## 2021-04-15 LAB — CBC WITH DIFFERENTIAL/PLATELET
Abs Immature Granulocytes: 0.06 10*3/uL (ref 0.00–0.07)
Basophils Absolute: 0 10*3/uL (ref 0.0–0.1)
Basophils Relative: 0 %
Eosinophils Absolute: 0 10*3/uL (ref 0.0–0.5)
Eosinophils Relative: 0 %
HCT: 32.9 % — ABNORMAL LOW (ref 36.0–46.0)
Hemoglobin: 10.4 g/dL — ABNORMAL LOW (ref 12.0–15.0)
Immature Granulocytes: 1 %
Lymphocytes Relative: 4 %
Lymphs Abs: 0.5 10*3/uL — ABNORMAL LOW (ref 0.7–4.0)
MCH: 28.1 pg (ref 26.0–34.0)
MCHC: 31.6 g/dL (ref 30.0–36.0)
MCV: 88.9 fL (ref 80.0–100.0)
Monocytes Absolute: 0.2 10*3/uL (ref 0.1–1.0)
Monocytes Relative: 2 %
Neutro Abs: 11.7 10*3/uL — ABNORMAL HIGH (ref 1.7–7.7)
Neutrophils Relative %: 93 %
Platelets: 321 10*3/uL (ref 150–400)
RBC: 3.7 MIL/uL — ABNORMAL LOW (ref 3.87–5.11)
RDW: 15.5 % (ref 11.5–15.5)
WBC: 12.4 10*3/uL — ABNORMAL HIGH (ref 4.0–10.5)
nRBC: 0 % (ref 0.0–0.2)

## 2021-04-15 MED ORDER — CELECOXIB 200 MG PO CAPS: 200.0000 mg | ORAL_CAPSULE | Freq: Two times a day (BID) | ORAL | 2 refills | Status: DC | PRN

## 2021-04-15 NOTE — Progress Notes (Signed)
Occupational Therapy Treatment Patient Details Name: Faith [name] MRN: [id number] DOB: 1955-09-05 Today's Date: 04/15/2021   History of present illness Pt is a 66 y.o. female admitted 04/11/21 with worsening BLE weakness; workup for suspected MS exacerbation. Thoracic MRI without evidence of significant disease progression. Lumbar MRI with soft tissue edema at sacral ulcer; no findings to explain worsening LE weakness. PMH includes multiple sclerosis, COPD, HTN, anemia, arthritis, s/p cervical sx.   OT comments  Pt making good progress with functional goals. Pt reports that she is feeling much better and is eager to d/c home later this afternoon. Pt will continue to have family and aide assist at home and with [facility] program.    Recommendations for follow up therapy are one component of a multi-disciplinary discharge planning process, led by the attending physician.  Recommendations may be updated based on patient status, additional functional criteria and insurance authorization.    Follow Up Recommendations  Outpatient OT (continue with therapy services at [facility])    Assistance Recommended at Discharge Intermittent Supervision/Assistance  Patient can return home with the following  A lot of help with bathing/dressing/bathroom;A little help with walking and/or transfers;Assistance with cooking/housework;Direct supervision/assist for medications management;Assist for transportation;Help with stairs or ramp for entrance   Equipment Recommendations  None recommended by OT    Recommendations for Other Services      Precautions / Restrictions Precautions Precautions: Fall Restrictions Weight Bearing Restrictions: No       Mobility Bed Mobility Overal bed mobility: Needs Assistance Bed Mobility: Sit to Supine     Supine to sit: Min assist, HOB elevated     General bed mobility comments: pt sitting EOB with NT upon arrival    Transfers Overall transfer level: Needs  assistance Equipment used: Rolling walker (2 wheels) Transfers: Sit to/from Stand, Bed to chair/wheelchair/BSC Sit to Stand: Min guard     Step pivot transfers: Min guard     General transfer comment: min guard for balance     Balance Overall balance assessment: Needs assistance Sitting-balance support: Single extremity supported, No upper extremity supported, Feet supported Sitting balance-Leahy Scale: Fair     Standing balance support: Reliant on assistive device for balance, During functional activity, Bilateral upper extremity supported Standing balance-Leahy Scale: Poor                             ADL either performed or assessed with clinical judgement   ADL       Grooming: Wash/dry hands;Wash/dry face;Sitting;Supervision/safety;Set up           Upper Body Dressing : Min guard;Sitting   Lower Body Dressing: Maximal assistance;Sit to/from stand   Toilet Transfer: Min guard;Stand-pivot;Rolling walker (2 wheels)           Functional mobility during ADLs: Min guard;Rolling walker (2 wheels) General ADL Comments: pt stood at EOB for staff to assist with donning pants    Extremity/Trunk Assessment Upper Extremity Assessment Upper Extremity Assessment: Generalized weakness;RUE deficits/detail;LUE deficits/detail RUE Deficits / Details: functionally at least 3/5 strength throughout; noted tremors with all mobility limiting ability to feed self (typically uses weights on wrists and weighted utensils at home) LUE Deficits / Details: functionally at least 3/5 strength throughout; noted tremors with all mobility limiting ability to feed self (typically uses weights on wrists and weighted utensils at home)   Lower Extremity Assessment Lower Extremity Assessment: Defer to PT evaluation   Cervical / Trunk Assessment Cervical /  Trunk Assessment: Kyphotic    Vision Baseline Vision/History: 1 Wears glasses Ability to See in Adequate Light: 1  Impaired Patient Visual Report: No change from baseline     Perception     Praxis      Cognition Arousal/Alertness: Awake/alert Behavior During Therapy: WFL for tasks assessed/performed, Flat affect Overall Cognitive Status: Within Functional Limits for tasks assessed                                          Exercises      Shoulder Instructions       General Comments      Pertinent Vitals/ Pain       Pain Assessment Pain Assessment: Faces Faces Pain Scale: Hurts a little bit Pain Location: back Pain Descriptors / Indicators: Discomfort Pain Intervention(s): Monitored during session, Repositioned  Home Living                                          Prior Functioning/Environment              Frequency  Min 2X/week        Progress Toward Goals  OT Goals(current goals can now be found in the care plan section)  Progress towards OT goals: Progressing toward goals     Plan Discharge plan remains appropriate    Co-evaluation                 AM-PAC OT "6 Clicks" Daily Activity     Outcome Measure   Help from another person eating meals?: None Help from another person taking care of personal grooming?: A Little Help from another person toileting, which includes using toliet, bedpan, or urinal?: A Lot Help from another person bathing (including washing, rinsing, drying)?: A Lot Help from another person to put on and taking off regular upper body clothing?: A Little Help from another person to put on and taking off regular lower body clothing?: A Lot 6 Click Score: 16    End of Session Equipment Utilized During Treatment: Gait belt;Rolling walker (2 wheels)  OT Visit Diagnosis: Unsteadiness on feet (R26.81);Other abnormalities of gait and mobility (R26.89);Muscle weakness (generalized) (M62.81)   Activity Tolerance Patient limited by fatigue;Patient limited by pain   Patient Left in bed;with call bell/phone  within reach;with nursing/sitter in room   Nurse Communication          Time: 1310-1330 OT Time Calculation (min): 20 min  Charges: OT General Charges $OT Visit: 1 Visit OT Treatments $Self Care/Home Management : 8-22 mins   Britt Bottom 04/15/2021, 3:42 PM

## 2021-04-15 NOTE — Plan of Care (Signed)

## 2021-04-15 NOTE — Plan of Care (Signed)

## 2021-04-15 NOTE — Progress Notes (Signed)
Reviewed discharge packet with pt, no questions or concerns. Pt safely picked up by [facility].

## 2021-04-15 NOTE — Discharge Summary (Signed)
Physician Discharge Summary  RICKIYA PICARIELLO WJX:[id number] DOB: 24-Aug-1955 DOA: 04/11/2021  PCP: Janifer Adie, MD  Admit date: 04/11/2021 Discharge date: 04/15/2021  Admitted From: Home Disposition:  Home  Discharge Condition:Stable CODE STATUS:FULL Diet recommendation: Regular    Brief/Interim Summary:  LISBET BUSKER is a 66 y.o. female with medical history significant of COPD, hypertension, multiple sclerosis, wheelchair dependent, pressure ulcer in the right buttock POA who presents to the emergency department via EMS due to weakness at [facility].  Patient stated that she usually can get out of bed to a wheelchair or walker to go to the bathroom, but  she was unable to lift legs off the bed.  She thought that this may be related to her multiple sclerosis.  EMS was activated and patient was sent to the ED for further evaluation and management.  Neurology was consulted and recommended to start IV solumedrol and transfer to [facility] hospital for neurology evaluation.  She was started on high-dose Solu-Medrol, she completed 5 days course.  Neurology recommended outpatient follow-up.  She is medically stable for discharge today.  Following problems were addressed during her hospitalization:  Exacerbation of multiple sclerosis ([place])- (present on admission) Without evidence of any underlying infection Continue home medications including baclofen, Valium, Neurontin, Norco Completed Solu-Medrol 1 g daily for 5-day course Neurology was consulted. MRI showed Extensive patchy cord signal abnormality throughout the thoracic spinal cord, consistent with history of chronic demyelinating disease/multiple sclerosis.  No new evidence of demyelination. We recommend outpatient follow-up with neurology   GERD (gastroesophageal reflux disease)- (present on admission) Continue Protonix   Leukocytosis- (present on admission) Likely from steroids.  Low suspicious for infection etiology.  Blood cultures  have not shown any growth.  Improved   Sacral decubitus ulcer, stage IV (HCC)- (present on admission) Present on admission WOC consulted   Essential hypertension Controlled   Bilateral leg weakness- (present on admission) Due to MS exacerbation She is wheelchair-bound at baseline.PT recommended outpatient follow-up   Tremor- (present on admission) Continue propranolol   Discharge Diagnoses:  Principal Problem:   Exacerbation of multiple sclerosis ([place]) Active Problems:   Tremor   Bilateral leg weakness   Sacral decubitus ulcer, stage IV (HCC)   Leukocytosis   GERD (gastroesophageal reflux disease)    Discharge Instructions  Discharge Instructions     Diet - low sodium heart healthy   Complete by: As directed    Discharge instructions   Complete by: As directed    1)Please follow up with your PCP and neurologist as an outpatient   Discharge wound care:   Complete by: As directed    As per wound care   Increase activity slowly   Complete by: As directed       Allergies as of 04/15/2021       Reactions   Darvon [propoxyphene] Other (See Comments)   Hallicuations   Penicillins Rash   Did it involve swelling of the face/tongue/throat, SOB, or low BP? Unknown Did it involve sudden or severe rash/hives, skin peeling, or any reaction on the inside of your mouth or nose? Unknown Did you need to seek medical attention at a hospital or doctor's office? Unknown When did it last happen?      Unknown If all above answers are NO, may proceed with cephalosporin use.        Medication List     TAKE these medications    ascorbic acid 500 MG tablet Commonly known as: VITAMIN C  Take 1 tablet (500 mg total) by mouth daily.   baclofen 10 MG tablet Commonly known as: LIORESAL Take 1 tablet (10 mg total) by mouth 3 (three) times daily.   celecoxib 200 MG capsule Commonly known as: CELEBREX Take 1 capsule (200 mg total) by mouth 2 (two) times daily as needed. What  changed:  when to take this reasons to take this   Cholecalciferol 50 MCG (2000 UT) Tabs Take 2,000 Int'l Units by mouth daily.   Vitamin D3 50 MCG (2000 UT) capsule Take 2,000 Units by mouth daily.   diazepam 5 MG tablet Commonly known as: VALIUM Take 1 tablet (5 mg total) by mouth See admin instructions. TAKE (1) TABLET BY MOUTH EVERY SIX HOURS AS NEEDED FOR MUSCLE SPASMS.   feeding supplement Liqd Take 237 mLs by mouth 3 (three) times daily between meals.   gabapentin 300 MG capsule Commonly known as: Neurontin Take 2 capsules (600 mg total) by mouth 3 (three) times daily.   HYDROcodone-acetaminophen 5-325 MG tablet Commonly known as: NORCO/VICODIN Take 1 tablet by mouth every 6 (six) hours as needed for moderate pain.   metroNIDAZOLE 500 MG tablet Commonly known as: FLAGYL 500 mg daily. Topical for wound   pantoprazole 40 MG tablet Commonly known as: Protonix Take 1 tablet (40 mg total) by mouth daily.   polyethylene glycol 17 g packet Commonly known as: MiraLax Take 17 g by mouth daily.   propranolol 40 MG tablet Commonly known as: INDERAL Take 0.5 tablets (20 mg total) by mouth 2 (two) times daily. What changed:  how much to take when to take this   sodium hypochlorite 0.125 % Soln Commonly known as: DAKIN'S 1/4 STRENGTH Apply topically.   zinc sulfate 220 (50 Zn) MG capsule Take 1 capsule (220 mg total) by mouth daily.               Discharge Care Instructions  (From admission, onward)           Start     Ordered   04/15/21 0000  Discharge wound care:       Comments: As per wound care   04/15/21 1320            Follow-up Information     Janifer Adie, MD. Schedule an appointment as soon as possible for a visit in 1 week(s).   Specialty: Family Medicine Contact information: [place] [address] [phone]                Allergies  Allergen Reactions   Darvon [Propoxyphene] Other (See Comments)     Hallicuations   Penicillins Rash    Did it involve swelling of the face/tongue/throat, SOB, or low BP? Unknown Did it involve sudden or severe rash/hives, skin peeling, or any reaction on the inside of your mouth or nose? Unknown Did you need to seek medical attention at a hospital or doctor's office? Unknown When did it last happen?      Unknown If all above answers are NO, may proceed with cephalosporin use.     Consultations: Neurology   Procedures/Studies: DG Sacrum/Coccyx  Result Date: 03/18/2021 CLINICAL DATA:  Pressure ulcer, osteomyelitis EXAM: SACRUM AND COCCYX - 2+ VIEW COMPARISON:  12/26/2020 FINDINGS: Frontal and lateral views of the sacrum and coccyx are obtained. I do not see any acute or destructive bony lesions. No periosteal reaction to suggest osteomyelitis. Evaluation of the soft tissues on the lateral view is limited by suboptimal technique.  Visualized portions of the bony pelvis are unremarkable. Stable lower lumbar spondylosis. IMPRESSION: 1. No acute or destructive bony lesions. No radiographic evidence of osteomyelitis. Electronically Signed   By: Randa Ngo M.D.   On: 03/18/2021 23:44   MR THORACIC SPINE W WO CONTRAST  Result Date: 04/12/2021 CLINICAL DATA:  Initial evaluation for acute lower extremity weakness, history of multiple sclerosis. EXAM: MRI THORACIC WITHOUT AND WITH CONTRAST TECHNIQUE: Multiplanar and multiecho pulse sequences of the thoracic spine were obtained without and with intravenous contrast. CONTRAST:  4.73mL GADAVIST GADOBUTROL 1 MMOL/ML IV SOLN COMPARISON:  Previous MRI from 03/01/2020. FINDINGS: Alignment:  Examination mildly degraded by motion artifact. Mild dextroscoliosis. Alignment otherwise normal preservation of the normal thoracic kyphosis. No listhesis. Vertebrae: Vertebral body height maintained without acute or chronic fracture. Bone marrow signal intensity within normal limits. Benign hemangioma noted within the T4 vertebral  body. No worrisome osseous lesions. No abnormal marrow edema or enhancement. Cord: Again seen is fairly extensive patchy cord signal abnormality throughout the thoracic spinal cord, extending from the cervicothoracic region to approximately T10-11 most obvious of these lesions seen involving the right dorsal cord at the level of T4 (series 18, image 12). Few scattered areas of associated spinal cord atrophy/volume loss. No visible cord expansion, edema, or enhancement to suggest active demyelination. In comparison with previous exam, overall appearance is grossly similar, with no convincing evidence of significant disease progression. Paraspinal and other soft tissues: Paraspinous soft tissues demonstrate no acute finding. Partially visualized lungs are grossly clear. Atheromatous change noted within the visualized intrathoracic aorta. Disc levels: T7-8: Small central disc protrusion indents the ventral thecal sac (series 18, image 23). No significant spinal stenosis or frank cord deformity. Foramina remain patent. Otherwise, no other significant disc pathology seen elsewhere within the thoracic spine for age. No other focal disc herniation. Scattered foraminal stenosis about the cervicothoracic junction is similar to previous. No new or progressive findings. IMPRESSION: 1. Extensive patchy cord signal abnormality throughout the thoracic spinal cord, consistent with history of chronic demyelinating disease/multiple sclerosis. Overall, appearance is grossly similar as compared to 03/01/2020, with no evidence of significant disease progression. No abnormal enhancement to suggest active demyelination. 2. Underlying mild for age thoracic spondylosis without significant spinal stenosis. Electronically Signed   By: Jeannine Boga M.D.   On: 04/12/2021 23:23   MR Lumbar Spine W Wo Contrast  Result Date: 04/12/2021 CLINICAL DATA:  Initial evaluation for acute lower extremity weakness, history of multiple  sclerosis. EXAM: MRI LUMBAR SPINE WITHOUT AND WITH CONTRAST TECHNIQUE: Multiplanar and multiecho pulse sequences of the lumbar spine were obtained without and with intravenous contrast. CONTRAST:  4.92mL GADAVIST GADOBUTROL 1 MMOL/ML IV SOLN COMPARISON:  Prior MRI from 03/01/2020. FINDINGS: Segmentation: Standard. Lowest well-formed disc space labeled the L5-S1 level. Alignment: Mild levoscoliosis. Trace facet mediated retrolisthesis of L2 on L3, with trace anterolisthesis of L3 on L4. Vertebrae: Vertebral body height maintained without acute or chronic fracture. Bone marrow signal intensity within normal limits. No discrete or worrisome osseous lesions. No abnormal marrow edema or enhancement. Conus medullaris and cauda equina: Conus extends to the T12 level. Conus and cauda equina appear normal. No abnormal enhancement. Paraspinal and other soft tissues: Mild edema and enhancement seen within the subcutaneous fat of the lower back/gluteal region, likely related to known history of decubitus ulcer. No loculated or drainable fluid collections visible. No visible evidence for osteomyelitis within the underlying sacrum. Paraspinous soft tissues demonstrate no other acute finding. Subcentimeter benign appearing cyst noted  within the left kidney. Visualized visceral structures otherwise unremarkable. Disc levels: L1-2: Normal interspace. Mild bilateral facet hypertrophy. No canal or foraminal stenosis. L2-3: Degenerative intervertebral disc space narrowing with diffuse disc bulge and disc desiccation, eccentric to the right. Associated reactive endplate spurring. Mild to moderate bilateral facet hypertrophy with associated trace joint effusions. Moderate narrowing of the right lateral recess. Central canal remains patent. Mild right L2 foraminal stenosis. Left neural foramen remains patent. L3-4: Circumferential disc bulge with disc desiccation. Superimposed right foraminal to extraforaminal disc protrusion contacts the  exiting right L3 nerve root (series 26, image 23). Moderate bilateral facet hypertrophy with associated trace joint effusions. Resultant mild canal with mild right greater than left lateral recess stenosis, descending L4 nerve root level. Mild right L3 foraminal stenosis. No significant left foraminal encroachment. L4-5: Moderate intervertebral disc space narrowing with diffuse disc bulge, disc desiccation, and reactive endplate spurring. Moderate bilateral facet arthrosis, worse on the left. Mild to moderate bilateral lateral recess narrowing, also slightly worse on the left. Central canal remains patent. Moderate left with mild-to-moderate right L4 foraminal stenosis. L5-S1: Disc desiccation without significant disc bulge. Left-sided endplate spurring. Moderate bilateral facet arthrosis. No significant spinal stenosis. Mild left L5 foraminal narrowing. Right neural foramina remains patent. IMPRESSION: 1. Mild edema and enhancement within the subcutaneous fat of the lower back/gluteal region, consistent with known history of decubitus ulcer. No loculated or drainable fluid collections visible. No evidence for osteomyelitis or other infection within the visualized underlying sacrum or lumbar spine. 2. Normal MRI appearance of the conus medullaris and nerve roots of the cauda equina. 3. Right eccentric disc bulge with facet hypertrophy at L2-3 with resultant moderate right lateral recess stenosis. 4. Right foraminal to extraforaminal disc protrusion at L3-4, contacting and potentially affecting the exiting right L3 nerve root. 5. Disc bulging with facet hypertrophy at L4-5 with resultant mild to moderate left greater than right lateral recess stenosis. Electronically Signed   By: Jeannine Boga M.D.   On: 04/12/2021 23:05   DG Chest Port 1 View  Result Date: 04/11/2021 CLINICAL DATA:  Weakness. EXAM: PORTABLE CHEST 1 VIEW COMPARISON:  Chest radiograph dated 02/14/2021 and CT dated 04/09/2021. FINDINGS:  Emphysematous changes of the lungs. No focal consolidation, pleural effusion, or pneumothorax. The cardiac silhouette is within limits. Atherosclerotic calcification of the aorta. No acute osseous pathology. IMPRESSION: No active disease. Electronically Signed   By: Anner Crete M.D.   On: 04/11/2021 20:44   MM 3D SCREEN BREAST BILATERAL  Result Date: 03/30/2021 CLINICAL DATA:  Screening. EXAM: DIGITAL SCREENING BILATERAL MAMMOGRAM WITH TOMOSYNTHESIS AND CAD TECHNIQUE: Bilateral screening digital craniocaudal and mediolateral oblique mammograms were obtained. Bilateral screening digital breast tomosynthesis was performed. The images were evaluated with computer-aided detection. COMPARISON:  Previous exam(s). ACR Breast Density Category d: The breast tissue is extremely dense, which lowers the sensitivity of mammography FINDINGS: The images are limited due to the patient's mobility restriction. Within this limitation, there are no findings suspicious for malignancy. IMPRESSION: No mammographic evidence of malignancy. A result letter of this screening mammogram will be mailed directly to the patient. RECOMMENDATION: Screening mammogram in one year. (Code:[id number]) BI-RADS CATEGORY  1: Negative. Electronically Signed   By: Ammie Ferrier M.D.   On: 03/30/2021 09:54   CT CHEST LUNG CA SCREEN LOW DOSE W/O CM  Result Date: 04/10/2021 CLINICAL DATA:  Current smoker, 25 pack-year history. EXAM: CT CHEST WITHOUT CONTRAST LOW-DOSE FOR LUNG CANCER SCREENING TECHNIQUE: Multidetector CT imaging of the chest was performed  following the standard protocol without IV contrast. RADIATION DOSE REDUCTION: This exam was performed according to the departmental dose-optimization program which includes automated exposure control, adjustment of the mA and/or kV according to patient size and/or use of iterative reconstruction technique. COMPARISON:  01/09/2020. FINDINGS: Cardiovascular: Atherosclerotic calcification of the  aorta, aortic valve and coronary arteries. Heart size normal. No pericardial effusion. Mediastinum/Nodes: No pathologically enlarged mediastinal or axillary lymph nodes. Hilar regions are difficult to definitively evaluate without IV contrast. Esophagus is grossly unremarkable. Lungs/Pleura: Centrilobular and paraseptal emphysema. Debris in right lower lobe bronchi. 2.3 mm right upper lobe nodule, unchanged. No new pulmonary nodules. No pleural fluid. Debris is seen in the airway. Upper Abdomen: Visualized portions of the liver and right adrenal gland are unremarkable. Low-attenuation thickening of the medial limb left adrenal gland. Visualized portions of the kidneys, spleen, pancreas, stomach and bowel are otherwise unremarkable. Musculoskeletal: Degenerative changes in the spine. No worrisome lytic or sclerotic lesions. IMPRESSION: 1. Lung-RADS 2, benign appearance or behavior. Continue annual screening with low-dose chest CT without contrast in 12 months. 2. Aortic atherosclerosis (ICD10-I70.0). Coronary artery calcification. 3.  Emphysema (ICD10-J43.9). Electronically Signed   By: Lorin Picket M.D.   On: 04/10/2021 12:59      Subjective:  Patient seen and examined at the bedside this morning.  Hemodynamically stable for discharge today. Discharge Exam: Vitals:   04/15/21 0829 04/15/21 1150  BP: 136/80 99/65  Pulse: 72 63  Resp: 16 16  Temp: 97.9 F (36.6 C) 98 F (36.7 C)  SpO2: 97% 98%   Vitals:   04/14/21 2022 04/15/21 0153 04/15/21 0829 04/15/21 1150  BP: (!) 149/83  136/80 99/65  Pulse: 80  72 63  Resp: 16 18 16 16   Temp: (!) 97.5 F (36.4 C) (!) 97.5 F (36.4 C) 97.9 F (36.6 C) 98 F (36.7 C)  TempSrc: Oral Axillary Oral Oral  SpO2: 94%  97% 98%  Weight:      Height:        General: Pt is alert, awake, not in acute distress Cardiovascular: RRR, S1/S2 +, no rubs, no gallops Respiratory: CTA bilaterally, no wheezing, no rhonchi Abdominal: Soft, NT, ND, bowel sounds  + Extremities: no edema, no cyanosis    The results of significant diagnostics from this hospitalization (including imaging, microbiology, ancillary and laboratory) are listed below for reference.     Microbiology: Recent Results (from the past 240 hour(s))  Resp Panel by RT-PCR (Flu A&B, Covid) Nasopharyngeal Swab     Status: None   Collection Time: 04/11/21  8:57 PM   Specimen: Nasopharyngeal Swab; Nasopharyngeal(NP) swabs in vial transport medium  Result Value Ref Range Status   SARS Coronavirus 2 by RT PCR NEGATIVE NEGATIVE Final    Comment: (NOTE) SARS-CoV-2 target nucleic acids are NOT DETECTED.  The SARS-CoV-2 RNA is generally detectable in upper respiratory specimens during the acute phase of infection. The lowest concentration of SARS-CoV-2 viral copies this assay can detect is 138 copies/mL. A negative result does not preclude SARS-Cov-2 infection and should not be used as the sole basis for treatment or other patient management decisions. A negative result may occur with  improper specimen collection/handling, submission of specimen other than nasopharyngeal swab, presence of viral mutation(s) within the areas targeted by this assay, and inadequate number of viral copies(<138 copies/mL). A negative result must be combined with clinical observations, patient history, and epidemiological information. The expected result is Negative.  Fact Sheet for Patients:  EntrepreneurPulse.com.au  Fact Sheet for  Healthcare Providers:  IncredibleEmployment.be  This test is no t yet approved or cleared by the [place] and  has been authorized for detection and/or diagnosis of SARS-CoV-2 by FDA under an Emergency Use Authorization (EUA). This EUA will remain  in effect (meaning this test can be used) for the duration of the COVID-19 declaration under Section 564(b)(1) of the Act, 21 U.S.C.section 360bbb-3(b)(1), unless the authorization  is terminated  or revoked sooner.       Influenza A by PCR NEGATIVE NEGATIVE Final   Influenza B by PCR NEGATIVE NEGATIVE Final    Comment: (NOTE) The Xpert Xpress SARS-CoV-2/FLU/RSV plus assay is intended as an aid in the diagnosis of influenza from Nasopharyngeal swab specimens and should not be used as a sole basis for treatment. Nasal washings and aspirates are unacceptable for Xpert Xpress SARS-CoV-2/FLU/RSV testing.  Fact Sheet for Patients: EntrepreneurPulse.com.au  Fact Sheet for Healthcare Providers: IncredibleEmployment.be  This test is not yet approved or cleared by the [place] FDA and has been authorized for detection and/or diagnosis of SARS-CoV-2 by FDA under an Emergency Use Authorization (EUA). This EUA will remain in effect (meaning this test can be used) for the duration of the COVID-19 declaration under Section 564(b)(1) of the Act, 21 U.S.C. section 360bbb-3(b)(1), unless the authorization is terminated or revoked.  Performed at [facility], [address] [place], [place] [address]   Blood culture (routine x 2)     Status: None (Preliminary result)   Collection Time: 04/11/21  9:14 PM   Specimen: Right Antecubital; Blood  Result Value Ref Range Status   Specimen Description RIGHT ANTECUBITAL  Final   Special Requests   Final    BOTTLES DRAWN AEROBIC AND ANAEROBIC Blood Culture adequate volume   Culture   Final    NO GROWTH 4 DAYS Performed at [facility], [address], [place], [place] [address]    Report Status PENDING  Incomplete  Blood culture (routine x 2)     Status: None (Preliminary result)   Collection Time: 04/11/21  9:15 PM   Specimen: Left Antecubital; Blood  Result Value Ref Range Status   Specimen Description LEFT ANTECUBITAL  Final   Special Requests   Final    BOTTLES DRAWN AEROBIC AND ANAEROBIC Blood Culture adequate volume   Culture   Final    NO GROWTH 4 DAYS Performed at [facility], [address] [place], [place] [address]    Report Status PENDING  Incomplete  MRSA Next Gen by PCR, Nasal     Status: None   Collection Time: 04/13/21  3:36 AM   Specimen: Nasal Mucosa; Nasal Swab  Result Value Ref Range Status   MRSA by PCR Next Gen NOT DETECTED NOT DETECTED Final    Comment: (NOTE) The GeneXpert MRSA Assay (FDA approved for NASAL specimens only), is one component of a comprehensive MRSA colonization surveillance program. It is not intended to diagnose MRSA infection nor to guide or monitor treatment for MRSA infections. Test performance is not FDA approved in patients less than 51 years old. Performed at [facility], [place] [address] [place], [place] [address]      Labs: BNP (last 3 results) No results for input(s): BNP in the last 8760 hours. Basic Metabolic Panel: Recent Labs  Lab 04/11/21 1933 04/12/21 0546 04/13/21 0038 04/14/21 0033  NA 137 139 139 139  K 3.9 3.7 3.7 3.8  CL 101 111 103 104  CO2 28 24 27  25  GLUCOSE 137* 149* 149* 170*  BUN 15 13 22  34*  CREATININE 0.56 0.56 0.61 0.72  CALCIUM 9.4 8.5* 9.0 8.9  MG  --  1.8  --   --   PHOS  --  3.2  --   --    Liver Function Tests: Recent Labs  Lab 04/11/21 1933 04/12/21 0546  AST 27 23  ALT 23 19  ALKPHOS 95 76  BILITOT 0.5 0.1*  PROT 7.7 6.4*  ALBUMIN 3.9 3.0*   No results for input(s): LIPASE, AMYLASE in the last 168 hours. No results for input(s): AMMONIA in the last 168 hours. CBC: Recent Labs  Lab 04/11/21 1933 04/12/21 0546 04/13/21 0038 04/14/21 0033 04/15/21 0107  WBC 16.4* 12.7* 21.7* 22.3* 12.4*  NEUTROABS 14.4*  --   --  21.3* 11.7*  HGB 12.3 10.6* 10.0* 10.2* 10.4*  HCT 39.2 33.5* 30.6* 31.9* 32.9*  MCV 91.2 89.8 87.7 89.4 88.9  PLT 331 287 310 313 321   Cardiac Enzymes: No results for input(s): CKTOTAL, CKMB, CKMBINDEX, TROPONINI in the last 168 hours. BNP: Invalid input(s): POCBNP CBG: No results for input(s): GLUCAP in the last 168  hours. D-Dimer No results for input(s): DDIMER in the last 72 hours. Hgb A1c No results for input(s): HGBA1C in the last 72 hours. Lipid Profile No results for input(s): CHOL, HDL, LDLCALC, TRIG, CHOLHDL, LDLDIRECT in the last 72 hours. Thyroid function studies No results for input(s): TSH, T4TOTAL, T3FREE, THYROIDAB in the last 72 hours.  Invalid input(s): FREET3 Anemia work up No results for input(s): VITAMINB12, FOLATE, FERRITIN, TIBC, IRON, RETICCTPCT in the last 72 hours. Urinalysis    Component Value Date/Time   COLORURINE YELLOW 04/11/2021 1952   APPEARANCEUR CLEAR 04/11/2021 1952   LABSPEC 1.010 04/11/2021 [place] 7.0 04/11/2021 1952   GLUCOSEU NEGATIVE 04/11/2021 1952   HGBUR NEGATIVE 04/11/2021 [place] NEGATIVE 04/11/2021 [place] NEGATIVE 04/11/2021 1952   PROTEINUR NEGATIVE 04/11/2021 1952   NITRITE NEGATIVE 04/11/2021 1952   LEUKOCYTESUR NEGATIVE 04/11/2021 1952   Sepsis Labs Invalid input(s): PROCALCITONIN,  WBC,  LACTICIDVEN Microbiology Recent Results (from the past 240 hour(s))  Resp Panel by RT-PCR (Flu A&B, Covid) Nasopharyngeal Swab     Status: None   Collection Time: 04/11/21  8:57 PM   Specimen: Nasopharyngeal Swab; Nasopharyngeal(NP) swabs in vial transport medium  Result Value Ref Range Status   SARS Coronavirus 2 by RT PCR NEGATIVE NEGATIVE Final    Comment: (NOTE) SARS-CoV-2 target nucleic acids are NOT DETECTED.  The SARS-CoV-2 RNA is generally detectable in upper respiratory specimens during the acute phase of infection. The lowest concentration of SARS-CoV-2 viral copies this assay can detect is 138 copies/mL. A negative result does not preclude SARS-Cov-2 infection and should not be used as the sole basis for treatment or other patient management decisions. A negative result may occur with  improper specimen collection/handling, submission of specimen other than nasopharyngeal swab, presence of viral mutation(s)  within the areas targeted by this assay, and inadequate number of viral copies(<138 copies/mL). A negative result must be combined with clinical observations, patient history, and epidemiological information. The expected result is Negative.  Fact Sheet for Patients:  EntrepreneurPulse.com.au  Fact Sheet for Healthcare Providers:  IncredibleEmployment.be  This test is no t yet approved or cleared by the [place] FDA and  has been authorized for detection and/or diagnosis of SARS-CoV-2 by FDA under an Emergency Use Authorization (EUA). This EUA will remain  in  effect (meaning this test can be used) for the duration of the COVID-19 declaration under Section 564(b)(1) of the Act, 21 U.S.C.section 360bbb-3(b)(1), unless the authorization is terminated  or revoked sooner.       Influenza A by PCR NEGATIVE NEGATIVE Final   Influenza B by PCR NEGATIVE NEGATIVE Final    Comment: (NOTE) The Xpert Xpress SARS-CoV-2/FLU/RSV plus assay is intended as an aid in the diagnosis of influenza from Nasopharyngeal swab specimens and should not be used as a sole basis for treatment. Nasal washings and aspirates are unacceptable for Xpert Xpress SARS-CoV-2/FLU/RSV testing.  Fact Sheet for Patients: EntrepreneurPulse.com.au  Fact Sheet for Healthcare Providers: IncredibleEmployment.be  This test is not yet approved or cleared by the [place] FDA and has been authorized for detection and/or diagnosis of SARS-CoV-2 by FDA under an Emergency Use Authorization (EUA). This EUA will remain in effect (meaning this test can be used) for the duration of the COVID-19 declaration under Section 564(b)(1) of the Act, 21 U.S.C. section 360bbb-3(b)(1), unless the authorization is terminated or revoked.  Performed at [facility], [address] [place], [place] [address]   Blood culture (routine x 2)     Status: None  (Preliminary result)   Collection Time: 04/11/21  9:14 PM   Specimen: Right Antecubital; Blood  Result Value Ref Range Status   Specimen Description RIGHT ANTECUBITAL  Final   Special Requests   Final    BOTTLES DRAWN AEROBIC AND ANAEROBIC Blood Culture adequate volume   Culture   Final    NO GROWTH 4 DAYS Performed at [facility], [address], [place], [place] [address]    Report Status PENDING  Incomplete  Blood culture (routine x 2)     Status: None (Preliminary result)   Collection Time: 04/11/21  9:15 PM   Specimen: Left Antecubital; Blood  Result Value Ref Range Status   Specimen Description LEFT ANTECUBITAL  Final   Special Requests   Final    BOTTLES DRAWN AEROBIC AND ANAEROBIC Blood Culture adequate volume   Culture   Final    NO GROWTH 4 DAYS Performed at [facility], [address], [place], [place] [address]    Report Status PENDING  Incomplete  MRSA Next Gen by PCR, Nasal     Status: None   Collection Time: 04/13/21  3:36 AM   Specimen: Nasal Mucosa; Nasal Swab  Result Value Ref Range Status   MRSA by PCR Next Gen NOT DETECTED NOT DETECTED Final    Comment: (NOTE) The GeneXpert MRSA Assay (FDA approved for NASAL specimens only), is one component of a comprehensive MRSA colonization surveillance program. It is not intended to diagnose MRSA infection nor to guide or monitor treatment for MRSA infections. Test performance is not FDA approved in patients less than 56 years old. Performed at [facility], [place] [address], [place], [place] [address]     Please note: You were cared for by a hospitalist during your hospital stay. Once you are discharged, your primary care physician will handle any further medical issues. Please note that NO REFILLS for any discharge medications will be authorized once you are discharged, as it is imperative that you return to your primary care physician (or establish a relationship with a primary care physician if you do not have  one) for your post hospital discharge needs so that they can reassess your need for medications and monitor your lab values.    Time coordinating discharge: 40 minutes  SIGNED:   Shelly Coss, MD  [facility] 04/15/2021, 1:21 PM Pager [phone]  If 7PM-7AM, please contact night-coverage [email or web address] Password [email or web address]

## 2021-04-15 NOTE — Progress Notes (Signed)
Physical Therapy Treatment Patient Details Name: Faith [name] MRN: [id number] DOB: 1955-03-06 Today's Date: 04/15/2021   History of Present Illness Pt is a 66 y.o. female admitted 04/11/21 with worsening BLE weakness; workup for suspected MS exacerbation. Thoracic MRI without evidence of significant disease progression. Lumbar MRI with soft tissue edema at sacral ulcer; no findings to explain worsening LE weakness. PMH includes multiple sclerosis, COPD, HTN, anemia, arthritis, s/p cervical sx.    PT Comments    Continuing work on functional mobility and activity tolerance;  session focused on transfers bed <> recliner to approximate bed to power chair transfers; Overall good progress, and OK for dc home from PT standpoint    Recommendations for follow up therapy are one component of a multi-disciplinary discharge planning process, led by the attending physician.  Recommendations may be updated based on patient status, additional functional criteria and insurance authorization.  Follow Up Recommendations  Outpatient PT (at [facility])     Assistance Recommended at Discharge Intermittent Supervision/Assistance  Patient can return home with the following A little help with walking and/or transfers;A little help with bathing/dressing/bathroom;Assistance with feeding;Assistance with cooking/housework;Assist for transportation;Help with stairs or ramp for entrance   Equipment Recommendations  None recommended by PT    Recommendations for Other Services       Precautions / Restrictions Precautions Precautions: Fall Restrictions Weight Bearing Restrictions: No     Mobility  Bed Mobility Overal bed mobility: Needs Assistance Bed Mobility: Supine to Sit, Sit to Supine     Supine to sit: Min guard (without physical contact) Sit to supine: Min guard   General bed mobility comments: Able to get up to EOB and lay back down without physical assist    Transfers Overall transfer level: Needs  assistance Equipment used: Rolling walker (2 wheels) Transfers: Sit to/from Stand, Bed to chair/wheelchair/BSC Sit to Stand: Min guard   Step pivot transfers: Min guard       General transfer comment: min guard for balance    Ambulation/Gait                   Stairs             Wheelchair Mobility    Modified Rankin (Stroke Patients Only)       Balance     Sitting balance-Leahy Scale: Fair     Standing balance support: Reliant on assistive device for balance, During functional activity, Bilateral upper extremity supported Standing balance-Leahy Scale: Poor                              Cognition Arousal/Alertness: Awake/alert Behavior During Therapy: WFL for tasks assessed/performed, Flat affect Overall Cognitive Status: Within Functional Limits for tasks assessed                                          Exercises      General Comments General comments (skin integrity, edema, etc.): Pt [facility] to [facility]; tehy arranged for [facility] to have her power chair      Pertinent Vitals/Pain Pain Assessment Pain Assessment: Faces Faces Pain Scale: Hurts a little bit Pain Location: back Pain Descriptors / Indicators: Discomfort Pain Intervention(s): Monitored during session    Home Living  Prior Function            PT Goals (current goals can now be found in the care plan section) Acute Rehab PT Goals Patient Stated Goal: Return home with son's help, keep working with therapies at [facility] PT Goal Formulation: With patient Time For Goal Achievement: 04/27/21 Potential to Achieve Goals: Good Progress towards PT goals: Progressing toward goals    Frequency    Min 3X/week      PT Plan Current plan remains appropriate    Co-evaluation              AM-PAC PT "6 Clicks" Mobility   Outcome Measure  Help needed turning from your back to your side while in a  flat bed without using bedrails?: None Help needed moving from lying on your back to sitting on the side of a flat bed without using bedrails?: None Help needed moving to and from a bed to a chair (including a wheelchair)?: A Little Help needed standing up from a chair using your arms (e.g., wheelchair or bedside chair)?: A Little Help needed to walk in hospital room?: A Little Help needed climbing 3-5 steps with a railing? : A Lot 6 Click Score: 19    End of Session Equipment Utilized During Treatment: Gait belt Activity Tolerance: Patient tolerated treatment well Patient left: in bed;with call bell/phone within reach Nurse Communication: Mobility status PT Visit Diagnosis: Other abnormalities of gait and mobility (R26.89);Muscle weakness (generalized) (M62.81);Other symptoms and signs involving the nervous system (R29.898)     Time: 4562-5638 PT Time Calculation (min) (ACUTE ONLY): 21 min  Charges:  $Therapeutic Activity: 8-22 mins                     Roney Marion, PT  Acute Rehabilitation Services Pager [phone] Office [place] 04/15/2021, 4:24 PM

## 2021-04-15 NOTE — Progress Notes (Signed)
Neurology Progress Note  Subjective: No acute overnight events Patient states that she feels she is at her baseline functional status with improvement since presentation.    Exam: Vitals:   04/15/21 0829 04/15/21 1150  BP: 136/80 99/65  Pulse: 72 63  Resp: 16 16  Temp: 97.9 F (36.6 C) 98 F (36.7 C)  SpO2: 97% 98%   Gen: Sitting up in bed, watching television, in no acute distress Resp: non-labored breathing, no respiratory distress on room air Abd: soft, non-tender, non-distended  Neuro: Mental Status: Awake, alert, and oriented. She is able to provide a clear and coherent history of present illness.  Speech is fluent without dysarthria or aphasia.  She follows commands without difficulty.  No neglect is noted.  Cranial Nerves: PERRL, visual fields are full, EOMI without gaze preference or nystagmus, facial sensation is intact and symmetric to light touch, face is symmetric resting and with movement, hearing is intact to voice, shoulders shrug symmetrically, tongue is midline.  Motor: BUE 4+/5 throughout  RLE 4/5 HF, 4/5 KE, 4/5 ADF LLE 4-/5 HF, 4/5 KE, 4-/5 ADF Decreased muscle bulk x 4. Patient does have tremors with intention noted on exam.  Sensory: Intact and symmetric to light touch in bilateral upper and lower extremities. DTR: 3+ patellae and biceps Plantars: Toes upgoing bilatearlly Gait: Deferred  Pertinent Labs: CBC    Component Value Date/Time   WBC 12.4 (H) 04/15/2021 0107   RBC 3.70 (L) 04/15/2021 0107   HGB 10.4 (L) 04/15/2021 0107   HCT 32.9 (L) 04/15/2021 0107   PLT 321 04/15/2021 0107   MCV 88.9 04/15/2021 0107   MCH 28.1 04/15/2021 0107   MCHC 31.6 04/15/2021 0107   RDW 15.5 04/15/2021 0107   LYMPHSABS 0.5 (L) 04/15/2021 0107   MONOABS 0.2 04/15/2021 0107   EOSABS 0.0 04/15/2021 0107   BASOSABS 0.0 04/15/2021 0107   CMP     Component Value Date/Time   NA 139 04/14/2021 0033   K 3.8 04/14/2021 0033   CL 104 04/14/2021 0033   CO2 25  04/14/2021 0033   GLUCOSE 170 (H) 04/14/2021 0033   BUN 34 (H) 04/14/2021 0033   CREATININE 0.72 04/14/2021 0033   CREATININE 0.53 11/28/2019 1339   CALCIUM 8.9 04/14/2021 0033   PROT 6.4 (L) 04/12/2021 0546   ALBUMIN 3.0 (L) 04/12/2021 0546   AST 23 04/12/2021 0546   ALT 19 04/12/2021 0546   ALKPHOS 76 04/12/2021 0546   BILITOT 0.1 (L) 04/12/2021 0546   GFRNONAA >60 04/14/2021 0033   GFRNONAA 101 11/28/2019 1339   GFRAA 117 11/28/2019 1339   Imaging Reviewed: MR THORACIC SPINE W WO CONTRAST CLINICAL DATA:  Initial evaluation for acute lower extremity weakness, history of multiple sclerosis.  EXAM: MRI THORACIC WITHOUT AND WITH CONTRAST  TECHNIQUE: Multiplanar and multiecho pulse sequences of the thoracic spine were obtained without and with intravenous contrast.  CONTRAST:  4.40mL GADAVIST GADOBUTROL 1 MMOL/ML IV SOLN  COMPARISON:  Previous MRI from 03/01/2020.  FINDINGS: Alignment:  Examination mildly degraded by motion artifact.  Mild dextroscoliosis. Alignment otherwise normal preservation of the normal thoracic kyphosis. No listhesis.  Vertebrae: Vertebral body height maintained without acute or chronic fracture. Bone marrow signal intensity within normal limits. Benign hemangioma noted within the T4 vertebral body. No worrisome osseous lesions. No abnormal marrow edema or enhancement.  Cord: Again seen is fairly extensive patchy cord signal abnormality throughout the thoracic spinal cord, extending from the cervicothoracic region to approximately T10-11 most obvious of these  lesions seen involving the right dorsal cord at the level of T4 (series 18, image 12). Few scattered areas of associated spinal cord atrophy/volume loss. No visible cord expansion, edema, or enhancement to suggest active demyelination. In comparison with previous exam, overall appearance is grossly similar, with no convincing evidence of significant disease progression.  Paraspinal and  other soft tissues: Paraspinous soft tissues demonstrate no acute finding. Partially visualized lungs are grossly clear. Atheromatous change noted within the visualized intrathoracic aorta.  Disc levels:  T7-8: Small central disc protrusion indents the ventral thecal sac (series 18, image 23). No significant spinal stenosis or frank cord deformity. Foramina remain patent.  Otherwise, no other significant disc pathology seen elsewhere within the thoracic spine for age. No other focal disc herniation. Scattered foraminal stenosis about the cervicothoracic junction is similar to previous. No new or progressive findings.  IMPRESSION: 1. Extensive patchy cord signal abnormality throughout the thoracic spinal cord, consistent with history of chronic demyelinating disease/multiple sclerosis. Overall, appearance is grossly similar as compared to 03/01/2020, with no evidence of significant disease progression. No abnormal enhancement to suggest active demyelination. 2. Underlying mild for age thoracic spondylosis without significant spinal stenosis.  Electronically Signed   By: Jeannine Boga M.D.   On: 04/12/2021 23:23 MR Lumbar Spine W Wo Contrast CLINICAL DATA:  Initial evaluation for acute lower extremity weakness, history of multiple sclerosis.  EXAM: MRI LUMBAR SPINE WITHOUT AND WITH CONTRAST  TECHNIQUE: Multiplanar and multiecho pulse sequences of the lumbar spine were obtained without and with intravenous contrast.  CONTRAST:  4.89mL GADAVIST GADOBUTROL 1 MMOL/ML IV SOLN  COMPARISON:  Prior MRI from 03/01/2020.  FINDINGS: Segmentation: Standard. Lowest well-formed disc space labeled the L5-S1 level.  Alignment: Mild levoscoliosis. Trace facet mediated retrolisthesis of L2 on L3, with trace anterolisthesis of L3 on L4.  Vertebrae: Vertebral body height maintained without acute or chronic fracture. Bone marrow signal intensity within normal limits. No discrete  or worrisome osseous lesions. No abnormal marrow edema or enhancement.  Conus medullaris and cauda equina: Conus extends to the T12 level. Conus and cauda equina appear normal. No abnormal enhancement.  Paraspinal and other soft tissues: Mild edema and enhancement seen within the subcutaneous fat of the lower back/gluteal region, likely related to known history of decubitus ulcer. No loculated or drainable fluid collections visible. No visible evidence for osteomyelitis within the underlying sacrum. Paraspinous soft tissues demonstrate no other acute finding. Subcentimeter benign appearing cyst noted within the left kidney. Visualized visceral structures otherwise unremarkable.  Disc levels:  L1-2: Normal interspace. Mild bilateral facet hypertrophy. No canal or foraminal stenosis.  L2-3: Degenerative intervertebral disc space narrowing with diffuse disc bulge and disc desiccation, eccentric to the right. Associated reactive endplate spurring. Mild to moderate bilateral facet hypertrophy with associated trace joint effusions. Moderate narrowing of the right lateral recess. Central canal remains patent. Mild right L2 foraminal stenosis. Left neural foramen remains patent.  L3-4: Circumferential disc bulge with disc desiccation. Superimposed right foraminal to extraforaminal disc protrusion contacts the exiting right L3 nerve root (series 26, image 23). Moderate bilateral facet hypertrophy with associated trace joint effusions. Resultant mild canal with mild right greater than left lateral recess stenosis, descending L4 nerve root level. Mild right L3 foraminal stenosis. No significant left foraminal encroachment.  L4-5: Moderate intervertebral disc space narrowing with diffuse disc bulge, disc desiccation, and reactive endplate spurring. Moderate bilateral facet arthrosis, worse on the left. Mild to moderate bilateral lateral recess narrowing, also slightly worse on the  left. Central canal remains patent. Moderate left with mild-to-moderate right L4 foraminal stenosis.  L5-S1: Disc desiccation without significant disc bulge. Left-sided endplate spurring. Moderate bilateral facet arthrosis. No significant spinal stenosis. Mild left L5 foraminal narrowing. Right neural foramina remains patent.  IMPRESSION: 1. Mild edema and enhancement within the subcutaneous fat of the lower back/gluteal region, consistent with known history of decubitus ulcer. No loculated or drainable fluid collections visible. No evidence for osteomyelitis or other infection within the visualized underlying sacrum or lumbar spine. 2. Normal MRI appearance of the conus medullaris and nerve roots of the cauda equina. 3. Right eccentric disc bulge with facet hypertrophy at L2-3 with resultant moderate right lateral recess stenosis. 4. Right foraminal to extraforaminal disc protrusion at L3-4, contacting and potentially affecting the exiting right L3 nerve root. 5. Disc bulging with facet hypertrophy at L4-5 with resultant mild to moderate left greater than right lateral recess stenosis.  Electronically Signed   By: Jeannine Boga M.D.   On: 04/12/2021 23:05  Assessment:  66 year old female with multiple sclerosis, presenting with symptoms of possible exacerbation - Patient endorses that her weakness is at her baseline functional status. She is able to elevate bilateral lower extremities antigravity with some left > right lower extremity weakness.  - MRI of thoracic spine with and without contrast: Extensive patchy cord signal abnormality throughout the thoracic spinal cord, consistent with history of chronic demyelinating disease/multiple sclerosis. Overall, appearance is grossly similar as compared to 03/01/2020, with no evidence of significant disease progression. No abnormal enhancement to suggest active demyelination. Underlying mild for age thoracic spondylosis without  significant spinal stenosis. - MRI of lumbar spine reveals soft tissue edema at the location of her decubitus ulcer, but no findings to explain her worsened lower extremity weakness.  - Elevated WBC has trended down to 12.7 --> 12.4 today. - Overall presentation is most consistent with an MS exacerbation. Patients with predominantly spinal cord manifestations often present with exacerbations that do not show a detectable acute enhancing lesion(s) on MRI. Infections can result in pseudo-exacerbations, but her WBC is trending down and she does not appear septic. Patient states that after 5 doses of IV Solu-Medrol that she is at her baseline functional status.   Recommendations: - Management of comorbid conditions per primary team - Recommend outpatient neurology follow up with Dr. Felecia Shelling of [facility], referral placed - No further inpatient neurology recommendations at this time, please call for further questions or concerns.   Anibal Henderson, AGACNP-BC [facility] [phone]

## 2021-04-15 NOTE — TOC Transition Note (Signed)
Transition of Care [facility]) - CM/SW Discharge Note   Patient Details  Name: Faith [name] MRN: [id number] Date of Birth: 1955-04-14  [facility] of [facility]) CM/SW Contact:  Benard Halsted, LCSW Phone Number: 04/15/2021, 1:31 PM   Clinical Narrative:    CSW left voicemail for [facility]. [facility] will transport patient. CSW requested 3pm pickup time per RN.    Final next level of care: Home/Self Care Barriers to Discharge: Barriers Resolved   Patient Goals and CMS Choice Patient states their goals for this hospitalization and ongoing recovery are:: return home CMS Medicare.gov Compare Post Acute Care list provided to:: Patient Choice offered to / list presented to : Patient  Discharge Placement                Patient to be transferred to facility by: [facility]   Patient and family notified of of transfer: 04/15/21  Discharge Plan and Services     Post Acute Care Choice: Resumption of Svcs/PTA Provider ([facility])                               Social Determinants of Health (SDOH) Interventions     Readmission Risk Interventions Readmission Risk Prevention Plan 02/16/2021 12/12/2020 03/31/2020  Post Dischage Appt - Complete Complete  Medication Screening - Complete Complete  Transportation Screening Complete Complete Complete  Home Care Screening Complete - -  Medication Review (RN CM) Complete - -  Some recent data might be hidden

## 2021-04-16 ENCOUNTER — Ambulatory Visit: Payer: Self-pay

## 2021-04-16 LAB — CULTURE, BLOOD (ROUTINE X 2)
Culture: NO GROWTH
Culture: NO GROWTH
Special Requests: ADEQUATE
Special Requests: ADEQUATE

## 2021-04-28 ENCOUNTER — Ambulatory Visit (INDEPENDENT_AMBULATORY_CARE_PROVIDER_SITE_OTHER): Payer: Medicare Other | Admitting: Neurology

## 2021-04-28 ENCOUNTER — Telehealth: Payer: Self-pay | Admitting: *Deleted

## 2021-04-28 ENCOUNTER — Other Ambulatory Visit: Payer: Self-pay

## 2021-04-28 ENCOUNTER — Encounter: Payer: Self-pay | Admitting: Neurology

## 2021-04-28 VITALS — BP 116/76 | HR 78

## 2021-04-28 DIAGNOSIS — Z79899 Other long term (current) drug therapy: Secondary | ICD-10-CM | POA: Diagnosis not present

## 2021-04-28 DIAGNOSIS — R269 Unspecified abnormalities of gait and mobility: Secondary | ICD-10-CM | POA: Diagnosis not present

## 2021-04-28 DIAGNOSIS — G35 Multiple sclerosis: Secondary | ICD-10-CM

## 2021-04-28 DIAGNOSIS — G8220 Paraplegia, unspecified: Secondary | ICD-10-CM

## 2021-04-28 DIAGNOSIS — G959 Disease of spinal cord, unspecified: Secondary | ICD-10-CM | POA: Diagnosis not present

## 2021-04-28 DIAGNOSIS — R208 Other disturbances of skin sensation: Secondary | ICD-10-CM

## 2021-04-28 NOTE — Telephone Encounter (Signed)
Placed [facility] lab in [facility] lock box for routine lab pick up. Results pending. 

## 2021-04-28 NOTE — Progress Notes (Addendum)
[facility]  PATIENT: Faith [name] DOB: [birth year]  REFERRING DOCTOR OR PCP: Barney Drain, MD SOURCE: Patient, notes from PCP, notes from Dr. Merlene Laughter, note from recent hospital stay, imaging and lab reports, MRI images personally reviewed  _________________________________   HISTORICAL  CHIEF COMPLAINT:  Chief Complaint  Patient presents with   New Patient (Initial Visit)    Room 3. Pt alone. She was given 5 doses of steroids for her recent exacerbation. She has not had any medication for MS for a few years.      HISTORY OF PRESENT ILLNESS:  I had the pleasure of seeing your patient, Faith [name], at the [place] at [facility] for neurologic consultation regarding her multiple sclerosis.  She is a 66 year old woman who was diagnosed with MS around 2007 but had symptoms for several years before that.  She has only been treated for a few years, on Tysabri for about 2 years and receiving 1 dose of Ocrevus.  She has not been on any medication for the past 3 years.  Currently, she is able to take a few steps in a room with a walker but uses the wheelchair for the most parts.    She does some PT at [facility].   Both legs are weak.   She notes numbness in both legs.   Arms are good strength.    She is on baclofen for spasticity, propranolol for tremor and gabapentin for dysesthesias.  Bladder function is fine.    She was hospitalized for worsening strength and gait 01/2021 and had incontinence.   .  She states she developed a sacral wound while in the hospital.  She received IV steroids after transfer from [facility] to [facility].     She went to a Rehab center. afterwards    She is using a Pullup now.     Vision is bad (bilateral).     Cognition is doing well.    She denies faigue in the mornings and does simpe chores then.   She has more fatigue later in the day.     She denies depression.   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    She has  recent sacral decubitus  MS History: She was diagnosed with MS in 2007 after presenting with gradual worsening of gait.  She had noted progressive worsening of gait before that year but the change seemed more rapid at the time.  Two to three years earlier she had no major disability.    She saw Dr. Merlene Laughter in [place] who diagnosed er with MS.   She received IV steroids.   She was placed on Tysabri and felt she was stable x 2 years.   However, due to JCV it was stopped.   She was switched to [place] but felt very poorly ad feverish.   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   She did not resume ay medication after that one dose.    She feels she worsened after the surgery,  She was using a walker before then and needed a wheelchair afterwards.     Imaging personally reviewed: MRI of the brain 02/29/2020 shows scattered T2/FLAIR hyperintense foci in the hemispheres consistent with MS.  None of the foci enhanced.  There was mild atrophy.  Only 1 infratentorial focus in the right posterior pons.  MRI of the cervical spine 02/29/2020 showed a T2 hyperintense focus posteriorly towards the right adjacent  to C4.  She also has fusion with posterior fixation at C3-C4 and foraminal narrowing to the left involving C7, to the left at C3 and bilaterally at C6.  The effusion resolved the spinal stenosis with possible myelopathy noted on her previous MRI  MRI of the thoracic spine 03/01/2020 showed multiple T2 hyperintense foci within the spinal cord most defined at T7, T10 and T11..  There was no enhancement.  No significant degenerative change.  REVIEW OF SYSTEMS: Constitutional: No fevers, chills, sweats, or change in appetite Eyes: No visual changes, double vision, eye pain Ear, nose and throat: No hearing loss, ear pain, nasal congestion, sore throat Cardiovascular: No chest pain, palpitations Respiratory:  No shortness of breath at rest or with exertion.   No wheezes GastrointestinaI:  No nausea, vomiting, diarrhea, abdominal pain, fecal incontinence Genitourinary:  No dysuria, urinary retention or frequency.  No nocturia. Musculoskeletal:  No neck pain, back pain Integumentary: No rash, pruritus, skin lesions Neurological: as above Psychiatric: No depression at this time.  No anxiety Endocrine: No palpitations, diaphoresis, change in appetite, change in weigh or increased thirst Hematologic/Lymphatic: She has had anemia.. Allergic/Immunologic: No itchy/runny eyes, nasal congestion, recent allergic reactions, rashes  ALLERGIES: Allergies  Allergen Reactions   Darvon [Propoxyphene] Other (See Comments)    Hallicuations   Penicillins Rash    Did it involve swelling of the face/tongue/throat, SOB, or low BP? Unknown Did it involve sudden or severe rash/hives, skin peeling, or any reaction on the inside of your mouth or nose? Unknown Did you need to seek medical attention at a hospital or doctor's office? Unknown When did it last happen?      Unknown If all above answers are NO, may proceed with cephalosporin use.     HOME MEDICATIONS:  Current Outpatient Medications:    ascorbic acid (VITAMIN C) 500 MG tablet, Take 1 tablet (500 mg total) by mouth daily., Disp: , Rfl:    atorvastatin (LIPITOR) 20 MG tablet, Take 20 mg by mouth daily., Disp: , Rfl:    baclofen (LIORESAL) 10 MG tablet, Take 1 tablet (10 mg total) by mouth 3 (three) times daily., Disp: 10 each, Rfl: 0   celecoxib (CELEBREX) 200 MG capsule, Take 1 capsule (200 mg total) by mouth 2 (two) times daily as needed., Disp: 60 capsule, Rfl: 2   Cholecalciferol (VITAMIN D3) 50 MCG (2000 UT) capsule, Take 2,000 Units by mouth daily., Disp: , Rfl:    diazepam (VALIUM) 5 MG tablet, Take 1 tablet (5 mg total) by mouth See admin instructions. TAKE (1) TABLET BY MOUTH EVERY SIX HOURS AS NEEDED FOR MUSCLE SPASMS., Disp: 10 tablet, Rfl: 0   feeding supplement (ENSURE ENLIVE / ENSURE PLUS) LIQD, Take 237 mLs by mouth 3  (three) times daily between meals., Disp: 237 mL, Rfl: 0   gabapentin (NEURONTIN) 300 MG capsule, Take 2 capsules (600 mg total) by mouth 3 (three) times daily., Disp: 180 capsule, Rfl: 1   HYDROcodone-acetaminophen (NORCO/VICODIN) 5-325 MG tablet, Take 1 tablet by mouth every 6 (six) hours as needed for moderate pain., Disp: 10 tablet, Rfl: 0   polyethylene glycol (MIRALAX) 17 g packet, Take 17 g by mouth daily., Disp: 14 each, Rfl: 0   propranolol (INDERAL) 60 MG tablet, Take 60 mg by mouth 3 (three) times daily., Disp: , Rfl:    zinc sulfate 220 (50 Zn) MG capsule, Take 1 capsule (220 mg total) by mouth daily., Disp: , Rfl:   PAST MEDICAL HISTORY: Past Medical History:  Diagnosis Date   Anemia    my whole life, Used to take iron   Arthritis    spine, lumbar   COPD (chronic obstructive pulmonary disease) (HCC)    Emphysema of lung ([place])    GERD (gastroesophageal reflux disease) 04/12/2021   HTN (hypertension) 03/01/2020   Hyperlipidemia 11/28/2019   Narcolepsy    by history, has nightmares   Neuromuscular disorder ([place])    leg issues from spine    PAST SURGICAL HISTORY: Past Surgical History:  Procedure Laterality Date   ANTERIOR CERVICAL DECOMP/DISCECTOMY FUSION N/A 01/19/2019   Procedure: ANTERIOR CERVICAL DECOMPRESSION/DISCECTOMY Cervical three - four;  Surgeon: Ashok Pall, MD;  Location: [place];  Service: Neurosurgery;  Laterality: N/A;   EYE SURGERY Right    ORIF FACIAL FRACTURE     POSTERIOR CERVICAL LAMINECTOMY N/A 01/19/2019   Procedure: POSTERIOR CERVICAL LAMINECTOMY Removal of Synovial Cyst and posterior cervical fusion;  Surgeon: Ashok Pall, MD;  Location: [place];  Service: Neurosurgery;  Laterality: N/A;   TEE WITHOUT CARDIOVERSION N/A 03/17/2020   Procedure: TRANSESOPHAGEAL ECHOCARDIOGRAM (TEE) WITH PROPOFOL;  Surgeon: Satira Sark, MD;  [facility];  Service: Cardiovascular;  Laterality: N/A;   TUBAL LIGATION     tubaligation      FAMILY  HISTORY: Family History  Problem Relation Age of Onset   Heart attack Mother    Kidney failure Mother    Diabetes Mother    Heart disease Mother    Hyperlipidemia Mother    Hypertension Mother    Heart attack Father 74   Hypertension Father    Hypertension Sister    Diabetes Sister    Other Brother        house fire   Hypertension Brother    Seizures Brother    Heart disease Brother        mitral valve   Arthritis Daughter        DJD, AS fibromyalgia   Polymyositis Daughter    Cancer Maternal Grandfather        lung   Heart disease Maternal Grandfather    Other Paternal Grandmother        house fire   Alcohol abuse Son     SOCIAL HISTORY:  Social History   Socioeconomic History   Marital status: Single    Spouse name: Not on file   Number of children: 3   Years of education: 12   Highest education level: Not on file  Occupational History   Occupation: disabled    Comment: house painting  Tobacco Use   Smoking status: Every Day    Packs/day: 0.25    Years: 45.00    Pack years: 11.25    Types: Cigarettes   Smokeless tobacco: Never   Tobacco comments:    cutting down  Vaping Use   Vaping Use: Never used  Substance and Sexual Activity   Alcohol use: Yes    Alcohol/week: 1.0 standard drink    Types: 1 Cans of beer per week   Drug use: No   Sexual activity: Not Currently    Birth control/protection: Post-menopausal  Other Topics Concern   Not on file  Social History Narrative   Patient lives with her son. [name], age 46, frequently visits   Social Determinants of Health   Financial Resource Strain: Not on file  Food Insecurity: Not on file  Transportation Needs: Not on file  Physical Activity: Not on file  Stress: Not on file  Social Connections:  Not on file  Intimate Partner Violence: Not on file     PHYSICAL EXAM  Vitals:   04/28/21 1335  BP: 116/76  Pulse: 78    There is no height or weight on file to calculate  BMI.   General: The patient is well-developed and well-nourished and in no acute distress  HEENT:  Head is [place]/AT.  Sclera are anicteric.    Neck: No carotid bruits are noted.  The neck is nontender.  Mildly reduced range of motion  Cardiovascular: The heart has a regular rate and rhythm with a normal S1 and S2. There were no murmurs, gallops or rubs.    Skin: Extremities are without rash or  edema.  Musculoskeletal:  Back is nontender  Neurologic Exam  Mental status: The patient is alert and oriented x 3 at the time of the examination. The patient has apparent normal recent and remote memory, with an apparently normal attention span and concentration ability.   Speech is normal.  Cranial nerves: Extraocular movements are full. Pupils are equal, round, and reactive to light and accomodation.  Visual fields are full.  Facial symmetry is present. There is good facial sensation to soft touch bilaterally.Facial strength is normal.  Trapezius and sternocleidomastoid strength is normal. No dysarthria is noted.  The tongue is midline, and the patient has symmetric elevation of the soft palate. No obvious hearing deficits are noted.  Motor:  Muscle bulk is normal.   Tone is normal. Strength is  4+ to 5 / 5 in arms and 4-/5 both iliopsoas and feet and 4/5 in quads.  Sensory: Sensory testing shows reduced vibration in right hand , mild reduced vibration in both legs symmetrically  Touch is stronger on her left than the right.  Legs more symmetric.    Coordination: Cerebellar testing reveals good finger-nose-finger and poor heel-to-shin in the setting of leg weakness.  Gait and station: She cannot stand without strong bilateral support.  Reflexes: Deep tendon reflexes are symmetric and normal in arms but increased in legs (crossed adductors and clonus, multiple beats).   Plantar responses are extensor right and equivocal left.     DIAGNOSTIC DATA (LABS, IMAGING, TESTING) - I reviewed patient  records, labs, notes, testing and imaging myself where available.  Lab Results  Component Value Date   WBC 12.4 (H) 04/15/2021   HGB 10.4 (L) 04/15/2021   HCT 32.9 (L) 04/15/2021   MCV 88.9 04/15/2021   PLT 321 04/15/2021      Component Value Date/Time   NA 139 04/14/2021 0033   K 3.8 04/14/2021 0033   CL 104 04/14/2021 0033   CO2 25 04/14/2021 0033   GLUCOSE 170 (H) 04/14/2021 0033   BUN 34 (H) 04/14/2021 0033   CREATININE 0.72 04/14/2021 0033   CREATININE 0.53 11/28/2019 1339   CALCIUM 8.9 04/14/2021 0033   PROT 6.4 (L) 04/12/2021 0546   ALBUMIN 3.0 (L) 04/12/2021 0546   AST 23 04/12/2021 0546   ALT 19 04/12/2021 0546   ALKPHOS 76 04/12/2021 0546   BILITOT 0.1 (L) 04/12/2021 0546   GFRNONAA >60 04/14/2021 0033   GFRNONAA 101 11/28/2019 1339   GFRAA 117 11/28/2019 1339   Lab Results  Component Value Date   CHOL 199 11/28/2019   HDL 87 11/28/2019   LDLCALC 97 11/28/2019   TRIG 65 11/28/2019   CHOLHDL 2.3 11/28/2019   No results found for: HGBA1C Lab Results  Component Value Date   VITAMINB12 448 03/01/2020   Lab Results  Component Value Date   TSH 1.29 09/20/2019       ASSESSMENT AND PLAN  Multiple sclerosis ([place]) - Plan: Hepatitis B surface antibody,qualitative, Hep B Surface Antigen, Hepatitis B core antibody, total, Varicella zoster antibody, IgG, QuantiFERON-TB Gold Plus, CBC with Differential/Platelet, Hepatitis C antibody, Stratify JCV Antibody Test ([facility])  High risk medication use - Plan: Hepatitis B surface antibody,qualitative, Hep B Surface Antigen, Hepatitis B core antibody, total, Varicella zoster antibody, IgG, QuantiFERON-TB Gold Plus, CBC with Differential/Platelet, Hepatitis C antibody, Stratify JCV Antibody Test ([facility])  Cervical myelopathy (HCC)  Gait disturbance  Dysesthesia  Diplegia of both lower extremities ([place])  In summary, Ms. Nieland is a 66 year old woman who was diagnosed with MS in 2007.  Much of her worsening has occurred  without relapse but more recently she has had worsening that occurred more acutely.  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  This may respond somewhat to therapy and we discussed disease modifying therapies.  She felt she did very well on Tysabri.  It was stopped because of concern about PML but I do not know what her JCV antibody level was.  If she is negative or low positive, we could consider starting Tysabri.  I would avoid Ocrevus because she felt poorly when she took it and because of risk of infection.  Mavenclad might be another option as treatment.  We will check blood work to see if she is a candidate.  Based on the blood work we will decide between [place]  She will continue baclofen for spasticity and gabapentin for dysesthesias.  She reports having refills.  She will return to see me in several months based on her treatment decision.  She should call sooner if she has new or worsening neurologic symptoms.  Thank you for asking me to see Ms. [name].  Please let me know if I can be of further assistance with her or other patients in the future.      Tyshan Enderle A. Felecia Shelling, MD, [facility] 8/32/9191, 6:60 PM Certified in Neurology, Clinical Neurophysiology, Sleep Medicine and Neuroimaging  [facility] [address] [place], [place] [address] [phone]

## 2021-04-30 ENCOUNTER — Ambulatory Visit: Payer: Self-pay | Admitting: Physician Assistant

## 2021-05-01 ENCOUNTER — Other Ambulatory Visit: Payer: Self-pay

## 2021-05-01 ENCOUNTER — Encounter: Payer: Medicare Other | Attending: Physician Assistant | Admitting: Physician Assistant

## 2021-05-01 DIAGNOSIS — F17210 Nicotine dependence, cigarettes, uncomplicated: Secondary | ICD-10-CM | POA: Diagnosis not present

## 2021-05-01 DIAGNOSIS — I10 Essential (primary) hypertension: Secondary | ICD-10-CM | POA: Insufficient documentation

## 2021-05-01 DIAGNOSIS — J449 Chronic obstructive pulmonary disease, unspecified: Secondary | ICD-10-CM | POA: Insufficient documentation

## 2021-05-01 DIAGNOSIS — G35 Multiple sclerosis: Secondary | ICD-10-CM | POA: Diagnosis not present

## 2021-05-01 DIAGNOSIS — L89154 Pressure ulcer of sacral region, stage 4: Secondary | ICD-10-CM | POA: Diagnosis present

## 2021-05-01 LAB — CBC WITH DIFFERENTIAL/PLATELET
Basophils Absolute: 0.1 10*3/uL (ref 0.0–0.2)
Basos: 1 %
EOS (ABSOLUTE): 0 10*3/uL (ref 0.0–0.4)
Eos: 0 %
Hematocrit: 34 % (ref 34.0–46.6)
Hemoglobin: 11.1 g/dL (ref 11.1–15.9)
Immature Grans (Abs): 0.1 10*3/uL (ref 0.0–0.1)
Immature Granulocytes: 1 %
Lymphocytes Absolute: 2.6 10*3/uL (ref 0.7–3.1)
Lymphs: 26 %
MCH: 28.7 pg (ref 26.6–33.0)
MCHC: 32.6 g/dL (ref 31.5–35.7)
MCV: 88 fL (ref 79–97)
Monocytes Absolute: 0.8 10*3/uL (ref 0.1–0.9)
Monocytes: 8 %
Neutrophils Absolute: 6.4 10*3/uL (ref 1.4–7.0)
Neutrophils: 64 %
Platelets: 328 10*3/uL (ref 150–450)
RBC: 3.87 x10E6/uL (ref 3.77–5.28)
RDW: 14.9 % (ref 11.7–15.4)
WBC: 9.9 10*3/uL (ref 3.4–10.8)

## 2021-05-01 LAB — QUANTIFERON-TB GOLD PLUS
QuantiFERON Mitogen Value: >10 IU/mL
QuantiFERON Nil Value: 0 IU/mL
QuantiFERON TB1 Ag Value: 0 IU/mL
QuantiFERON TB2 Ag Value: 0.02 IU/mL
QuantiFERON-TB Gold Plus: NEGATIVE

## 2021-05-01 LAB — HEPATITIS B SURFACE ANTIGEN: Hepatitis B Surface Ag: NEGATIVE

## 2021-05-01 LAB — HEPATITIS C ANTIBODY: Hep C Virus Ab: NONREACTIVE

## 2021-05-01 LAB — HEPATITIS B CORE ANTIBODY, TOTAL: Hep B Core Total Ab: NEGATIVE

## 2021-05-01 LAB — VARICELLA ZOSTER ANTIBODY, IGG: Varicella zoster IgG: 3109 index (ref >165)

## 2021-05-01 LAB — HEPATITIS B SURFACE ANTIBODY,QUALITATIVE: Hep B Surface Ab, Qual: NONREACTIVE

## 2021-05-01 NOTE — Progress Notes (Addendum)
Faith [name], Faith [name] ([id number]) Visit Report for 05/01/2021 Arrival Information Details Patient Name: Faith [name], [name] Date of Service: 05/01/2021 9:45 AM Medical Record Number: [id number] Patient Account Number: [id number] Date of Birth/Sex: [birth year] (66 y.o. F) Treating RN: Levora Dredge Primary Care Ifeoma Vallin: Barney Drain Other Clinician: Referring Roena Sassaman: Barney Drain Treating Brandis Matsuura/Extender: Skipper Cliche in Treatment: 8 Visit Information History Since Last Visit Added or deleted any medications: No Patient Arrived: Wheel Chair Any new allergies or adverse reactions: No Arrival Time: 10:09 Had a fall or experienced change in No Accompanied By: self activities of daily living that may affect Transfer Assistance: None risk of falls: Patient Identification Verified: Yes Hospitalized since last visit: No Secondary Verification Process Completed: Yes Has Dressing in Place as Prescribed: Yes Patient Requires Transmission-Based No Pain Present Now: Yes Precautions: Patient Has Alerts: Yes Patient Alerts: NOT diabetic Fax to [facility] Electronic Signature(s) Signed: 05/01/2021 12:21:05 PM By: Levora Dredge Entered By: Levora Dredge on 05/01/2021 10:12:42 Livengood, Britt A. ([id number]) -------------------------------------------------------------------------------- Clinic Level of Care Assessment Details Patient Name: Faith [name], [name] Date of Service: 05/01/2021 9:45 AM Medical Record Number: [id number] Patient Account Number: [id number] Date of Birth/Sex: [birth year] (66 y.o. F) Treating RN: Levora Dredge Primary Care Tee Richeson: Barney Drain Other Clinician: Referring Chavie Kolinski: Barney Drain Treating Brock Mokry/Extender: Skipper Cliche in Treatment: 8 Clinic Level of Care Assessment Items TOOL 4 Quantity Score []  - Use when only an EandM is performed on FOLLOW-UP visit 0 ASSESSMENTS - Nursing Assessment / Reassessment []  - Reassessment of Co-morbidities  (includes updates in patient status) 0 X- 1 5 Reassessment of Adherence to Treatment Plan ASSESSMENTS - Wound and Skin Assessment / Reassessment X - Simple Wound Assessment / Reassessment - one wound 1 5 []  - 0 Complex Wound Assessment / Reassessment - multiple wounds []  - 0 Dermatologic / Skin Assessment (not related to wound area) ASSESSMENTS - Focused Assessment []  - Circumferential Edema Measurements - multi extremities 0 []  - 0 Nutritional Assessment / Counseling / Intervention []  - 0 Lower Extremity Assessment (monofilament, tuning fork, pulses) []  - 0 Peripheral Arterial Disease Assessment (using hand held doppler) ASSESSMENTS - Ostomy and/or Continence Assessment and Care []  - Incontinence Assessment and Management 0 []  - 0 Ostomy Care Assessment and Management (repouching, etc.) PROCESS - Coordination of Care X - Simple Patient / Family Education for ongoing care 1 15 []  - 0 Complex (extensive) Patient / Family Education for ongoing care []  - 0 Staff obtains Programmer, systems, Records, Test Results / Process Orders []  - 0 Staff telephones HHA, Nursing Homes / Clarify orders / etc []  - 0 Routine Transfer to another Facility (non-emergent condition) []  - 0 Routine Hospital Admission (non-emergent condition) []  - 0 New Admissions / Biomedical engineer / Ordering NPWT, Apligraf, etc. []  - 0 Emergency Hospital Admission (emergent condition) X- 1 10 Simple Discharge Coordination []  - 0 Complex (extensive) Discharge Coordination PROCESS - Special Needs []  - Pediatric / Minor Patient Management 0 []  - 0 Isolation Patient Management []  - 0 Hearing / Language / Visual special needs []  - 0 Assessment of Community assistance (transportation, D/C planning, etc.) []  - 0 Additional assistance / Altered mentation []  - 0 Support Surface(s) Assessment (bed, cushion, seat, etc.) INTERVENTIONS - Wound Cleansing / Measurement Faith [name], [name] ([id number]) X- 1 5 Simple Wound  Cleansing - one wound []  - 0 Complex Wound Cleansing - multiple wounds X- 1 5 Wound Imaging (photographs - any number of wounds) []  - 0 Wound Tracing (instead of  photographs) X- 1 5 Simple Wound Measurement - one wound []  - 0 Complex Wound Measurement - multiple wounds INTERVENTIONS - Wound Dressings X - Small Wound Dressing one or multiple wounds 1 10 []  - 0 Medium Wound Dressing one or multiple wounds []  - 0 Large Wound Dressing one or multiple wounds []  - 0 Application of Medications - topical []  - 0 Application of Medications - injection INTERVENTIONS - Miscellaneous []  - External ear exam 0 []  - 0 Specimen Collection (cultures, biopsies, blood, body fluids, etc.) []  - 0 Specimen(s) / Culture(s) sent or taken to Lab for analysis []  - 0 Patient Transfer (multiple staff / Civil Service fast streamer / Similar devices) []  - 0 Simple Staple / Suture removal (25 or less) []  - 0 Complex Staple / Suture removal (26 or more) []  - 0 Hypo / Hyperglycemic Management (close monitor of Blood Glucose) []  - 0 Ankle / Brachial Index (ABI) - do not check if billed separately X- 1 5 Vital Signs Has the patient been seen at the hospital within the last three years: Yes Total Score: 65 Level Of Care: New/Established - Level 2 Electronic Signature(s) Signed: 05/01/2021 12:21:05 PM By: Levora Dredge Entered By: Levora Dredge on 05/01/2021 12:05:06 Faith [name], [name] ([id number]) -------------------------------------------------------------------------------- Encounter Discharge Information Details Patient Name: Faith Hale A. Date of Service: 05/01/2021 9:45 AM Medical Record Number: [id number] Patient Account Number: [id number] Date of Birth/Sex: Feb 13, 1956 (66 y.o. F) Treating RN: Levora Dredge Primary Care Arvis Miguez: Barney Drain Other Clinician: Referring Seini Lannom: Barney Drain Treating Jb Dulworth/Extender: Skipper Cliche in Treatment: 8 Encounter Discharge Information Items Discharge  Condition: Stable Ambulatory Status: Wheelchair Discharge Destination: Skilled Nursing Facility Orders Sent: Yes Transportation: Other Accompanied By: self Schedule Follow-up Appointment: Yes Clinical Summary of Care: Electronic Signature(s) Signed: 05/01/2021 12:06:21 PM By: Levora Dredge Entered By: Levora Dredge on 05/01/2021 12:06:20 Faith [name] ([id number]) -------------------------------------------------------------------------------- Lower Extremity Assessment Details Patient Name: Faith [name], [name] Date of Service: 05/01/2021 9:45 AM Medical Record Number: [id number] Patient Account Number: [id number] Date of Birth/Sex: [birth year] (66 y.o. F) Treating RN: Levora Dredge Primary Care Houda Brau: Barney Drain Other Clinician: Referring Aleja Yearwood: Barney Drain Treating Giorgio Chabot/Extender: Jeri Cos Weeks in Treatment: 8 Electronic Signature(s) Signed: 05/01/2021 12:21:05 PM By: Levora Dredge Entered By: Levora Dredge on 05/01/2021 10:25:31 Buehl, Lynnie [name] Kitchen ([id number]) -------------------------------------------------------------------------------- Multi Wound Chart Details Patient Name: Faith [name], [name] Date of Service: 05/01/2021 9:45 AM Medical Record Number: [id number] Patient Account Number: [id number] Date of Birth/Sex: Aug 19, 1955 (66 y.o. F) Treating RN: Levora Dredge Primary Care Takari Lundahl: Barney Drain Other Clinician: Referring Marilena Trevathan: Barney Drain Treating Daquan Crapps/Extender: Skipper Cliche in Treatment: 8 Vital Signs Height(in): 64 Pulse(bpm): 22 Weight(lbs): 96 Blood Pressure(mmHg): 125/83 Body Mass Index(BMI): 16.5 Temperature(F): 97.7 Respiratory Rate(breaths/min): 18 Photos: [N/A:N/A] Wound Location: Sacrum N/A N/A Wounding Event: Pressure Injury N/A N/A Primary Etiology: Pressure Ulcer N/A N/A Secondary Etiology: Auto-immune N/A N/A Comorbid History: Anemia, Chronic Obstructive N/A N/A Pulmonary Disease (COPD), Coronary Artery  Disease, Hypertension, Osteoarthritis Date Acquired: 08/03/2020 N/A N/A Weeks of Treatment: 8 N/A N/A Wound Status: Open N/A N/A Wound Recurrence: No N/A N/A Measurements L x W x D (cm) 4x4x2.5 N/A N/A Area (cm) : 12.566 N/A N/A Volume (cm) : 31.416 N/A N/A % Reduction in Area: -8.00% N/A N/A % Reduction in Volume: -8.00% N/A N/A Position 1 (o'clock): 6 Maximum Distance 1 (cm): 4 Starting Position 1 (o'clock): 12 Ending Position 1 (o'clock): 12 Maximum Distance 1 (cm): 4 Tunneling: Yes N/A N/A Undermining: Yes N/A N/A Classification: Category/Stage IV  N/A N/A Exudate Amount: Large N/A N/A Exudate Type: Serosanguineous N/A N/A Exudate Color: red, Whirley N/A N/A Granulation Amount: Medium (34-66%) N/A N/A Granulation Quality: Red, Pink N/A N/A Necrotic Amount: Medium (34-66%) N/A N/A Exposed Structures: Fat Layer (Subcutaneous Tissue): N/A N/A Yes Muscle: Yes Fascia: No Tendon: No Joint: No Bone: No Epithelialization: None N/A N/A Treatment Notes Faith [name], Faith [name] ([id number]) Electronic Signature(s) Signed: 05/01/2021 12:21:05 PM By: Levora Dredge Entered By: Levora Dredge on 05/01/2021 10:37:01 Faith [name] ([id number]) -------------------------------------------------------------------------------- Multi-Disciplinary Care Plan Details Patient Name: Faith Hale A. Date of Service: 05/01/2021 9:45 AM Medical Record Number: [id number] Patient Account Number: [id number] Date of Birth/Sex: May 17, 1955 (66 y.o. F) Treating RN: Levora Dredge Primary Care Rashena Dowling: Barney Drain Other Clinician: Referring Caridad Silveira: Barney Drain Treating Hannahmarie Asberry/Extender: Jeri Cos Weeks in Treatment: 8 Active Inactive Electronic Signature(s) Signed: 05/01/2021 1:06:20 PM By: Donnamarie Poag Signed: 05/04/2021 3:20:36 PM By: Levora Dredge Previous Signature: 05/01/2021 12:21:05 PM Version By: Levora Dredge Entered By: Donnamarie Poag on 05/01/2021 13:06:20 Faith [name], [name]  ([id number]) -------------------------------------------------------------------------------- Pain Assessment Details Patient Name: Faith [name], Zamoria A. Date of Service: 05/01/2021 9:45 AM Medical Record Number: [id number] Patient Account Number: [id number] Date of Birth/Sex: February 12, 1956 (67 y.o. F) Treating RN: Levora Dredge Primary Care Bodhi Stenglein: Barney Drain Other Clinician: Referring Debany Vantol: Barney Drain Treating Seena Face/Extender: Skipper Cliche in Treatment: 8 Active Problems Location of Pain Severity and Description of Pain Patient Has Paino Yes Site Locations Rate the pain. Current Pain Level: 5 Pain Management and Medication Current Pain Management: Notes pt states pain at wound site to sacrum Electronic Signature(s) Signed: 05/01/2021 12:21:05 PM By: Levora Dredge Entered By: Levora Dredge on 05/01/2021 10:18:06 Faith [name], [name] ([id number]) -------------------------------------------------------------------------------- Patient/Caregiver Education Details Patient Name: Faith Hale A. Date of Service: 05/01/2021 9:45 AM Medical Record Number: [id number] Patient Account Number: [id number] Date of Birth/Gender: [birth year] (66 y.o. F) Treating RN: Levora Dredge Primary Care Physician: Barney Drain Other Clinician: Referring Physician: Barney Drain Treating Physician/Extender: Skipper Cliche in Treatment: 8 Education Assessment Education Provided To: Patient Education Topics Provided Wound/Skin Impairment: Handouts: Caring for Your Ulcer Methods: Explain/Verbal Responses: State content correctly Electronic Signature(s) Signed: 05/01/2021 12:21:05 PM By: Levora Dredge Entered By: Levora Dredge on 05/01/2021 12:05:26 Faith [name], [name] ([id number]) -------------------------------------------------------------------------------- Wound Assessment Details Patient Name: Faith [name], Elayjah A. Date of Service: 05/01/2021 9:45 AM Medical Record Number: [id number] Patient  Account Number: [id number] Date of Birth/Sex: [birth year] (66 y.o. F) Treating RN: Levora Dredge Primary Care Eathel Pajak: Barney Drain Other Clinician: Referring Josi Roediger: Barney Drain Treating Dorice Stiggers/Extender: Jeri Cos Weeks in Treatment: 8 Wound Status Wound Number: 1 Primary Pressure Ulcer Etiology: Wound Location: Sacrum Secondary Auto-immune Wounding Event: Pressure Injury Etiology: Date Acquired: 08/03/2020 Wound Open Weeks Of Treatment: 8 Status: Clustered Wound: No Comorbid Anemia, Chronic Obstructive Pulmonary Disease (COPD), History: Coronary Artery Disease, Hypertension, Osteoarthritis Photos Wound Measurements Length: (cm) 4 % Reductio Width: (cm) 4 % Reductio Depth: (cm) 2.5 Epithelial Area: (cm) 12.566 Tunneling Volume: (cm) 31.416 Positi Maximum n in Area: -8% n in Volume: -8% ization: None : Yes on (o'clock): 6 Distance: (cm) 4 Undermining: Yes Starting Position (o'clock): 12 Ending Position (o'clock): 12 Maximum Distance: (cm) 4 Wound Description Classification: Category/Stage IV Foul Odor Exudate Amount: Large Slough/Fi Exudate Type: Serosanguineous Exudate Color: red, Treiber After Cleansing: No brino Yes Wound Bed Granulation Amount: Medium (34-66%) Exposed Structure Granulation Quality: Red, Pink Fascia Exposed: No Necrotic Amount: Medium (34-66%) Fat Layer (Subcutaneous Tissue) Exposed: Yes Necrotic Quality: Adherent Slough Tendon Exposed: No Muscle Exposed: Yes  Necrosis of Muscle: No Joint Exposed: No Bone Exposed: No LANA, FLAIM A. ([id number]) Electronic Signature(s) Signed: 05/01/2021 12:21:05 PM By: Levora Dredge Entered By: Levora Dredge on 05/01/2021 10:24:30 Salyers, Shantana A. ([id number]) -------------------------------------------------------------------------------- [place] Details Patient Name: Faith [name], Anastaisa A. Date of Service: 05/01/2021 9:45 AM Medical Record Number: [id number] Patient Account Number: [id number] Date  of Birth/Sex: January 03, 1956 (66 y.o. F) Treating RN: Levora Dredge Primary Care Sharlyne Koeneman: Barney Drain Other Clinician: Referring July Nickson: Barney Drain Treating Auriella Wieand/Extender: Skipper Cliche in Treatment: 8 Vital Signs Time Taken: 10:15 Temperature (F): 97.7 Height (in): 64 Pulse (bpm): 77 Weight (lbs): 96 Respiratory Rate (breaths/min): 18 Body Mass Index (BMI): 16.5 Blood Pressure (mmHg): 125/83 Reference Range: 80 - 120 mg / dl Electronic Signature(s) Signed: 05/01/2021 12:21:05 PM By: Levora Dredge Entered By: Levora Dredge on 05/01/2021 10:17:44

## 2021-05-01 NOTE — Progress Notes (Addendum)
KENYATTE, GRUBER ([id number]) Visit Report for 05/01/2021 Chief Complaint Document Details Patient Name: Faith, FINDER [name]. Date of Service: 05/01/2021 9:45 AM Medical Record Number: [id number] Patient Account Number: [id number] Date of Birth/Sex: [birth year] (66 y.o. F) Treating RN: Levora Dredge Primary Care Provider: Barney Drain Other Clinician: Referring Provider: Barney Drain Treating Provider/Extender: Skipper Cliche in Treatment: 8 Information Obtained from: Patient Chief Complaint Sacral pressure ulcer Electronic Signature(s) Signed: 05/01/2021 9:36:33 AM By: Worthy Keeler PA-C Entered By: Worthy Keeler on 05/01/2021 09:36:33 Manalo, Faith [name] ([id number]) -------------------------------------------------------------------------------- HPI Details Patient Name: Faith [name]. Date of Service: 05/01/2021 9:45 AM Medical Record Number: [id number] Patient Account Number: [id number] Date of Birth/Sex: Aug 26, 1955 (66 y.o. 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 This actually began somewhere around August 03, 2020 according to what she tells me. She tells me she was in any pending hospital when it started. In December she was in the hospital as well for [name] short stent of time before she went home she was in [name] skilled nursing facility as well it sounds like for [name] bit. [facility] and [facility]. Currently she tells me that they have been doing some of the dressing changes and keeping it covered with just [name] border foam dressing at times. She is also had this open to air sometimes so that it can  "get air". Nonetheless I explained to her today that we definitely do not want it to just be left open to air that can actually be one of the worst things at this point. Nonetheless she does have [name] significant wound here and this is can be [name] very difficult wound to heal. The patient does have [name] history of multiple sclerosis, hypertension, COPD, and she does smoke cigarettes 3/day she tells [place]. 05/01/2021 upon evaluation today patient appears to be doing somewhat poorly in regard to her wound unfortunately. I do believe that part of the issue is that this may just be on its own not doing as well as would like to see that is progressing to [name] deeper wound. Subsequently the other issue is that it does not sound like all the nurses necessarily been packing the wound with the Hydrofera Blue. This has gotten [name] bit deeper compared to last time I saw her. There is also some evidence of some hypergranulation as well. I do believe she could benefit from the Dakin's moistened gauze packing. Electronic Signature(s) Signed: 05/01/2021 1:15:38 PM By: Worthy Keeler PA-C Entered By: Worthy Keeler on 05/01/2021 13:15:37 Faith [name], Faith [name] ([id number]) -------------------------------------------------------------------------------- Physical Exam Details Patient Name: Faith [name], Faith [name]. Date of Service: 05/01/2021 9:45 AM Medical Record Number: [id number] Patient Account Number: [id number] Date of Birth/Sex: [birth year] (66 y.o. F) Treating RN: Levora Dredge Primary Care Provider: Barney Drain Other Clinician: Referring Provider: Barney Drain Treating Provider/Extender: Skipper Cliche in Treatment: 8 Constitutional Well-nourished and well-hydrated in no acute distress. Respiratory normal breathing without difficulty. Psychiatric this patient is able to make decisions and demonstrates good insight into disease process. Alert and Oriented x 3. pleasant and cooperative. Notes Upon inspection patient's wound bed  actually showed signs again of some hypergranulation and  necrotic tissue. I do believe the Dakin's moistened gauze packing could be beneficial there is significant undermining here however which I think is good to be problematic as well to be perfectly honest. This all was discussed with the patient today. With that being said we will see how things continue to progress. Electronic Signature(s) Signed: 05/01/2021 1:16:01 PM By: Worthy Keeler PA-C Entered By: Worthy Keeler on 05/01/2021 13:16:01 [name], Faith [name] ([id number]) -------------------------------------------------------------------------------- Physician Orders Details Patient Name: Faith [name]. Date of Service: 05/01/2021 9:45 AM Medical Record Number: [id number] Patient Account Number: [id number] Date of Birth/Sex: 1955-06-24 (66 y.o. F) Treating RN: Levora Dredge Primary Care Provider: Barney Drain Other Clinician: Referring Provider: Barney Drain Treating Provider/Extender: Skipper Cliche in Treatment: 8 Verbal / Phone Orders: No Diagnosis Coding ICD-10 Coding Code Description L89.154 Pressure ulcer of sacral region, stage 4 G35 Multiple sclerosis I10 Essential (primary) hypertension J44.9 Chronic obstructive pulmonary disease, unspecified F17.218 Nicotine dependence, cigarettes, with other nicotine-induced disorders Follow-up Appointments o Return Appointment in 2 weeks. o Nurse Visit as needed [place] Orders/Instructions: - [facility] to do dress[facility] and order xrays and supplies [facility]DERS TO [facility] @[facility]  phone [phone]; fax [phone] Bathing/ Shower/ Hygiene o Clean wound with Normal Saline or wound cleanser. o No tub bath. Anesthetic (Use 'Patient Medications' Section for Anesthetic Order Entry) o Lidocaine applied to wound bed Off-Loading o Roho cushion for wheelchair o Hospital bed/mattress o Turn and reposition every 2 hours Additional Orders /  Instructions o Follow Nutritious Diet and Increase Protein Intake Wound Treatment Wound #1 - Sacrum Cleanser: Dakin 16 (oz) 0.25 1 x Per Day/15 Days Discharge Instructions: Use as directed. Cleanser: Normal Saline 1 x Per XBM/84 Days Discharge Instructions: Wash your hands with soap and water. Remove old dressing, discard into plastic bag and place into trash. Cleanse the wound with Normal Saline prior to applying [name] clean dressing using gauze sponges, not tissues or cotton balls. Do not scrub or use excessive force. Pat dry using gauze sponges, not tissue or cotton balls. Primary Dressing: Kerlix Roll Sterile, 4.5x3.1 (in/yd) 1 x Per Day/15 Days Discharge Instructions: moistened with Dakins Solution pack lightly, continuous piece of gauze to be used for packing Secondary Dressing: Allevyn Adhesive Foam Sacrum Dressing, 6.75x6.75 (in/in) 1 x Per XLK/44 Days Discharge Instructions: or alternative border foam large dressing Electronic Signature(s) Signed: 05/01/2021 12:21:05 PM By: Levora Dredge Signed: 05/01/2021 5:14:00 PM By: Worthy Keeler PA-C Entered By: Levora Dredge on 05/01/2021 10:53:49 Faith [name], Faith [name]. ([id number]) Faith [name], Faith [name]. ([id number]) -------------------------------------------------------------------------------- Problem List Details Patient Name: Faith [name], Faith [name]. Date of Service: 05/01/2021 9:45 AM Medical Record Number: [id number] Patient Account Number: [id number] Date of Birth/Sex: [birth year] (66 y.o. F) Treating RN: Levora Dredge Primary Care Provider: Barney Drain Other Clinician: Referring Provider: Barney Drain Treating Provider/Extender: Skipper Cliche in Treatment: 8 Active Problems ICD-10 Encounter Code Description Active Date MDM Diagnosis L89.154 Pressure ulcer of sacral region, stage 4 03/05/2021 No Yes G35 Multiple sclerosis 03/05/2021 No Yes I10 Essential (primary) hypertension 03/05/2021 No Yes J44.9 Chronic obstructive pulmonary disease,  unspecified 03/05/2021 No Yes F17.218 Nicotine dependence, cigarettes, with other nicotine-induced disorders 03/05/2021 No Yes Inactive Problems Resolved Problems Electronic Signature(s) Signed: 05/01/2021 9:36:27 AM By: Worthy Keeler PA-C Entered By: Worthy Keeler on 05/01/2021 09:36:27 Faith [name], Faith [name]. ([id number]) -------------------------------------------------------------------------------- Progress Note Details Patient Name: Faith [name], Bernese [name]. Date of Service: 05/01/2021 9:45 AM Medical Record Number: [id number] Patient Account Number: [id number] Date of Birth/Sex: Jul 27, 1955 (  66 y.o. 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 This actually began somewhere around August 03, 2020 according to what she tells me. She tells me she was in any pending hospital when it started. In December she was in the hospital as well for [name] short stent of time before she went home she was in [name] skilled nursing facility as well it sounds like for [name] bit. [facility] and [facility]. Currently she tells me that they have been doing some of the dressing changes and keeping it covered with just [name] border foam dressing at times. She is also had this open to air sometimes so that it can "get air". Nonetheless I explained to her today that we definitely do not want it to just be left open to air that can actually be one of the worst things at this point. Nonetheless she does have [name] significant wound here and this is can be [name] very difficult wound to heal. The  patient does have [name] history of multiple sclerosis, hypertension, COPD, and she does smoke cigarettes 3/day she tells [place]. 05/01/2021 upon evaluation today patient appears to be doing somewhat poorly in regard to her wound unfortunately. I do believe that part of the issue is that this may just be on its own not doing as well as would like to see that is progressing to [name] deeper wound. Subsequently the other issue is that it does not sound like all the nurses necessarily been packing the wound with the Hydrofera Blue. This has gotten [name] bit deeper compared to last time I saw her. There is also some evidence of some hypergranulation as well. I do believe she could benefit from the Dakin's moistened gauze packing. Objective Constitutional Well-nourished and well-hydrated in no acute distress. Vitals Time Taken: 10:15 AM, Height: 64 in, Weight: 96 lbs, BMI: 16.5, Temperature: 97.7 F, Pulse: 77 bpm, Respiratory Rate: 18 breaths/min, Blood Pressure: 125/83 mmHg. Respiratory normal breathing without difficulty. Psychiatric this patient is able to make decisions and demonstrates good insight into disease process. Alert and Oriented x 3. pleasant and cooperative. General Notes: Upon inspection patient's wound bed actually showed signs again of some hypergranulation and necrotic tissue. I do believe the Dakin's moistened gauze packing could be beneficial there is significant undermining here however which I think is good to be problematic as well to be perfectly honest. This all was discussed with the patient today. With that being said we will see how things continue to progress. Integumentary (Hair, Skin) Wound #1 status is Open. Original cause of wound was Pressure Injury. The date acquired was: 08/03/2020. The wound has been in treatment 8 weeks. The wound is located on the Sacrum. The wound measures 4cm length x 4cm width x 2.5cm depth; 12.566cm^2 area and 31.416cm^3 volume. There is muscle and Fat Layer  (Subcutaneous Tissue) exposed. Tunneling has been noted at 6:00 with [name] maximum distance of 4cm. Undermining begins at 12:00 and ends at 12:00 with [name] maximum distance of 4cm. There is [name] large amount of serosanguineous drainage noted. There is medium (34-66%) red, pink granulation within the  wound bed. There is [name] medium (34-66%) amount of necrotic tissue within the wound bed including Adherent Slough. Faith [name], Faith [name]. ([id number]) Assessment Active Problems ICD-10 Pressure ulcer of sacral region, stage 4 Multiple sclerosis Essential (primary) hypertension Chronic obstructive pulmonary disease, unspecified Nicotine dependence, cigarettes, with other nicotine-induced disorders Plan Follow-up Appointments: Return Appointment in 2 weeks. Nurse Visit as needed Home Health: Other Home Health Orders/Instructions: - [facility] to do dress[facility] and order xrays and supplies [facility]DERS TO [facility]  phone [phone]; fax [phone] Bathing/ Shower/ Hygiene: Clean wound with Normal Saline or wound cleanser. No tub bath. Anesthetic (Use 'Patient Medications' Section for Anesthetic Order Entry): Lidocaine applied to wound bed Off-Loading: Roho cushion for wheelchair Hospital bed/mattress Turn and reposition every 2 hours Additional Orders / Instructions: Follow Nutritious Diet and Increase Protein Intake WOUND #1: - Sacrum Wound Laterality: Cleanser: Dakin 16 (oz) 0.25 1 x Per Day/15 Days Discharge Instructions: Use as directed. Cleanser: Normal Saline 1 x Per FVC/94 Days Discharge Instructions: Wash your hands with soap and water. Remove old dressing, discard into plastic bag and place into trash. Cleanse the wound with Normal Saline prior to applying [name] clean dressing using gauze sponges, not tissues or cotton balls. Do not scrub or use excessive force. Pat dry using gauze sponges, not tissue or cotton balls. Primary Dressing: Kerlix Roll Sterile, 4.5x3.1 (in/yd) 1 x Per Day/15  Days Discharge Instructions: moistened with Dakins Solution pack lightly, continuous piece of gauze to be used for packing Secondary Dressing: Allevyn Adhesive Foam Sacrum Dressing, 6.75x6.75 (in/in) 1 x Per WHQ/75 Days Discharge Instructions: or alternative border foam large dressing 1. Would recommend currently that we going continue with the wound care measures as before with regard to packing the wound although we will switch to Dakin's moistened gauze at this point. 2. Also can recommend that we have the patient continue with the Allevyn border foam dressing to cover. 3. I also am going to suggest appropriate offloading should be undertaken. Obviously the more of this she can do the better. We will see patient back for reevaluation in 2 weeks here in the clinic. If anything worsens or changes patient will contact our office for additional recommendations. Unfortunately I did get word after the patient was leaving to schedule the appointment that she was not good to be coming back to the clinic that [facility] is actually good to be handling her care on their own and she no longer will require wound care services here at the clinic. Electronic Signature(s) Signed: 05/01/2021 1:16:51 PM By: Worthy Keeler PA-C Entered By: Worthy Keeler on 05/01/2021 13:16:51 Faith [name], Faith [name] ([id number]) -------------------------------------------------------------------------------- SuperBill Details Patient Name: Faith [name]. Date of Service: 05/01/2021 Medical Record Number: [id number] Patient Account Number: [id number] Date of Birth/Sex: [birth year] (66 y.o. F) Treating RN: Levora Dredge Primary Care Provider: Barney Drain Other Clinician: Referring Provider: Barney Drain Treating Provider/Extender: Skipper Cliche in Treatment: 8 Diagnosis Coding ICD-10 Codes Code Description [phone] Pressure ulcer of sacral region, stage 4 G35 Multiple sclerosis I10 Essential (primary) hypertension J44.9  Chronic obstructive pulmonary disease, unspecified F17.218 Nicotine dependence, cigarettes, with other nicotine-induced disorders Facility Procedures CPT4 Code: [id number] Description: [phone] - WOUND CARE VISIT-LEV 2 EST PT Modifier: Quantity: 1 Physician Procedures CPT4 Code: [id number] Description: 99214 - WC PHYS LEVEL 4 - EST PT Modifier: Quantity: 1 CPT4 Code: Description: ICD-10 Diagnosis Description L89.154 Pressure ulcer of sacral region, stage 4 G35 Multiple sclerosis I10 Essential (primary) hypertension J44.9 Chronic obstructive pulmonary disease, unspecified  Modifier: Quantity: Electronic Signature(s) Signed: 05/01/2021 1:17:10 PM By: Worthy Keeler PA-C Previous Signature: 05/01/2021 12:05:14 PM Version By: Levora Dredge Entered By: Worthy Keeler on 05/01/2021 13:17:10

## 2021-05-06 NOTE — Telephone Encounter (Signed)
Gave report below to MD for review. ? ? ? ? ?

## 2021-05-07 ENCOUNTER — Telehealth: Payer: Self-pay | Admitting: *Deleted

## 2021-05-07 NOTE — Telephone Encounter (Signed)
Faxed completed/signed Mavenclad start form to [place] at [phone]. Received fax confirmation. ?

## 2021-05-07 NOTE — Telephone Encounter (Signed)
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  Due to infections I prefer her not to go on Ocrevus. ? ?Therefore, we will have her begin [place]. ?

## 2021-05-07 NOTE — Telephone Encounter (Signed)
LVM for pt (ok per DPR) relaying results per Dr. Garth Bigness note. Advised we will send in start form she signed for [facility]. Advised she should be on look out for phone call from [facility] in the near future. Asked she call back if she has any further questions/concerns.  ?

## 2021-05-11 NOTE — Telephone Encounter (Signed)
Patient has Faith [name] of the [employer]. I called [facility] to find out how to do the PA for mavenclad with this insurance but was forced to leave a VM asking for a call back. ?

## 2021-05-11 NOTE — Telephone Encounter (Signed)
Mavenclad order faxed to [facility]. Received a receipt of confirmation. ? ?

## 2021-05-11 NOTE — Telephone Encounter (Signed)
[facility]) all orders faxed to Dr. Bradd Burner. ? ?Fax: [phone] ?

## 2021-05-18 NOTE — Telephone Encounter (Signed)
Received notice from [place] that a PA is needed for [facility]. I called [employer]. They initiated a PA on CMM for completion. ? ?Tecfidera, Vumerity, Gilenya, and fingolimod are the preferred formulary alternatives. ? ?I spoke with Dr. Felecia Shelling. All of the preferred formulary alternatives will likely be ineffective for her due to her advanced MS progression and disability. ? ?Sent PA to [facility]. Should have a determination within 1-3 business days. Key: [id number]. ?

## 2021-05-18 NOTE — Telephone Encounter (Signed)
Mavenclad PA was approved by [facility]: ?  ?"Member ID Number: [id number] ?As a member of [employer] ([place]), we are pleased to inform you that, upon review of  ?the information provided by you or your doctor, we have approved the requested coverage for  ?the following prescription drug(s): ?MAVENCLAD (4 TABS) Tab Ther Pack ?Type of coverage approved: Non-Formulary ?This approval authorizes your coverage from 04/01/2021 - 05/18/2022." ?

## 2021-05-18 NOTE — Telephone Encounter (Signed)
[facility] Caryl Pina) notify [facility] received second prescription for [facility] from Dr. Barney Drain.  ?

## 2021-05-19 NOTE — Telephone Encounter (Signed)
I have notified [facility] PA approval. ?

## 2021-05-22 ENCOUNTER — Ambulatory Visit: Payer: Medicare Other | Admitting: Physician Assistant

## 2021-05-26 NOTE — Telephone Encounter (Signed)
I called patient to discuss if she has received her mavenclad shipment. No answer, left a message asking her to call me back. ?

## 2021-05-27 NOTE — Telephone Encounter (Signed)
[facility] C[facility] P[facility]) notifying the physician pt will not speak with field nurse. Not quite sure if pt has received Mavenclad, she will not confirm. ? ?Contact nfo: [phone] ext [phone] ?

## 2021-06-01 NOTE — Telephone Encounter (Signed)
I called patient. Mavenclad was shipped to [facility] and she took her first dose today and has thus far tolerated it well. ? ?Her appointment with Dr. Felecia Shelling was moved up to 08/10/21 at 1:30 for the 2 month follow up/labs post mavenclad start. This was discussed with Kennyth Lose at [facility]. ? ?I called Ashley at [facility] back but no answer, left a message asking her to call us back. ?

## 2021-07-09 ENCOUNTER — Inpatient Hospital Stay: Admission: RE | Admit: 2021-07-09 | Payer: Medicaid Other | Source: Ambulatory Visit

## 2021-08-10 ENCOUNTER — Encounter: Payer: Self-pay | Admitting: Neurology

## 2021-08-10 ENCOUNTER — Ambulatory Visit: Payer: Medicare Other | Admitting: Neurology

## 2021-08-18 ENCOUNTER — Encounter (HOSPITAL_COMMUNITY): Payer: Self-pay | Admitting: Emergency Medicine

## 2021-08-18 ENCOUNTER — Other Ambulatory Visit: Payer: Self-pay

## 2021-08-18 ENCOUNTER — Emergency Department (HOSPITAL_COMMUNITY)
Admission: EM | Admit: 2021-08-18 | Discharge: 2021-08-19 | Disposition: A | Discharge status: Home / Self Care | Payer: Medicare Other | Attending: Emergency Medicine | Admitting: Emergency Medicine

## 2021-08-18 DIAGNOSIS — I10 Essential (primary) hypertension: Secondary | ICD-10-CM | POA: Insufficient documentation

## 2021-08-18 DIAGNOSIS — R531 Weakness: Secondary | ICD-10-CM | POA: Diagnosis present

## 2021-08-18 DIAGNOSIS — L89154 Pressure ulcer of sacral region, stage 4: Secondary | ICD-10-CM | POA: Insufficient documentation

## 2021-08-18 DIAGNOSIS — J449 Chronic obstructive pulmonary disease, unspecified: Secondary | ICD-10-CM | POA: Diagnosis not present

## 2021-08-18 NOTE — ED Triage Notes (Signed)
Pt c/o bleeding bed sore to sacrum.

## 2021-08-18 NOTE — ED Provider Notes (Signed)
[facility] EMERGENCY DEPARTMENT Provider Note   CSN: [id number] Arrival date & time: 08/18/21  2009     History  Chief Complaint  Patient presents with   Wound Check    Faith [name] is a 66 y.o. female.  Patient used her alert button to be brought in by EMS.  Patient known to have a longstanding sacral decubitus.  Had bleeding from it which alarmed her and the reason why she called EMS.  Patient has MS and is bedridden most of the time.  But able to get around with a walker.  She states that she is followed by wound care in [place].  Chart review cannot see any indication follow-up with them.  States that her daughter-in-law helps with dressing changes 4 times a week.  Past medical history significant for the MS COPD hyperlipidemia hypertension.  Patient last seen in the emergency department in February for multiple sclerosis exacerbation.  That was at [facility].  Patient presented with generalized weakness at that time.  Patient's vital signs on arrival very reassuring temp of 97.8 heart rate 82 respiration 17 blood pressure 129/88 oxygen sats 98%.       Home Medications Prior to Admission medications   Medication Sig Start Date End Date Taking? Authorizing Provider  ascorbic acid (VITAMIN C) 500 MG tablet Take 1 tablet (500 mg total) by mouth daily. 02/18/21   Geradine Girt, DO  atorvastatin (LIPITOR) 20 MG tablet Take 20 mg by mouth daily.    [provider]  baclofen (LIORESAL) 10 MG tablet Take 1 tablet (10 mg total) by mouth 3 (three) times daily. 02/17/21   Geradine Girt, DO  celecoxib (CELEBREX) 200 MG capsule Take 1 capsule (200 mg total) by mouth 2 (two) times daily as needed. 04/15/21   Shelly Coss, MD  Cholecalciferol (VITAMIN D3) 50 MCG (2000 UT) capsule Take 2,000 Units by mouth daily. 02/11/21   [provider]  Cladribine, 4 Tabs, (MAVENCLAD, 4 TABS,) 10 MG TBPK Take 10 mg by mouth as directed. Take 1 tablet PO each day for 4 days. Repeat  cycle one month later.    Sater, Nanine Means, MD  diazepam (VALIUM) 5 MG tablet Take 1 tablet (5 mg total) by mouth See admin instructions. TAKE (1) TABLET BY MOUTH EVERY SIX HOURS AS NEEDED FOR MUSCLE SPASMS. 02/17/21   Geradine Girt, DO  feeding supplement (ENSURE ENLIVE / ENSURE PLUS) LIQD Take 237 mLs by mouth 3 (three) times daily between meals. 12/12/20   Alma Friendly, MD  gabapentin (NEURONTIN) 300 MG capsule Take 2 capsules (600 mg total) by mouth 3 (three) times daily. 02/05/19   Angiulli, Lavon Paganini, PA-C  HYDROcodone-acetaminophen (NORCO/VICODIN) 5-325 MG tablet Take 1 tablet by mouth every 6 (six) hours as needed for moderate pain. 02/17/21   Geradine Girt, DO  polyethylene glycol (MIRALAX) 17 g packet Take 17 g by mouth daily. 12/25/20   Fredia Sorrow, MD  propranolol (INDERAL) 60 MG tablet Take 60 mg by mouth 3 (three) times daily.    [provider]  zinc sulfate 220 (50 Zn) MG capsule Take 1 capsule (220 mg total) by mouth daily. 02/18/21   Geradine Girt, DO      Allergies    Darvon [propoxyphene] and Penicillins    Review of Systems   Review of Systems  Constitutional:  Negative for chills and fever.  HENT:  Negative for ear pain and sore throat.   Eyes:  Negative  for pain and visual disturbance.  Respiratory:  Negative for cough and shortness of breath.   Cardiovascular:  Negative for chest pain and palpitations.  Gastrointestinal:  Negative for abdominal pain and vomiting.  Genitourinary:  Negative for dysuria and hematuria.  Musculoskeletal:  Negative for arthralgias and back pain.  Skin:  Positive for wound. Negative for color change and rash.  Neurological:  Positive for weakness. Negative for seizures and syncope.  All other systems reviewed and are negative.   Physical Exam Updated Vital Signs BP 128/82   Pulse 74   Temp 97.8 F (36.6 C) (Oral)   Resp 17   Ht 1.626 m ('5\' 4"'$ )   Wt 47 kg   SpO2 92%   BMI 17.79 kg/m  Physical  Exam Vitals and nursing note reviewed.  Constitutional:      General: She is not in acute distress.    Appearance: Normal appearance. She is well-developed.  HENT:     Head: Normocephalic and atraumatic.  Eyes:     Extraocular Movements: Extraocular movements intact.     Conjunctiva/sclera: Conjunctivae normal.     Pupils: Pupils are equal, round, and reactive to light.  Cardiovascular:     Rate and Rhythm: Normal rate and regular rhythm.     Heart sounds: No murmur heard. Pulmonary:     Effort: Pulmonary effort is normal. No respiratory distress.     Breath sounds: Normal breath sounds.  Abdominal:     Palpations: Abdomen is soft.     Tenderness: There is no abdominal tenderness.  Musculoskeletal:        General: No swelling.     Cervical back: Normal range of motion and neck supple.     Comments: Large chronic decubitus ulcer measuring about 8 cm round sacral area.  Would be stage IV deep into the tissues.  A little bit of blood clot in the area evaluated and cleaned up no active bleeding.  Bedpan did show some blood.  But no active bleeding no evidence of any serious infection.  Skin:    General: Skin is warm and dry.     Capillary Refill: Capillary refill takes less than 2 seconds.  Neurological:     Mental Status: She is alert and oriented to person, place, and time. Mental status is at baseline.  Psychiatric:        Mood and Affect: Mood normal.     ED Results / Procedures / Treatments   Labs (all labs ordered are listed, but only abnormal results are displayed) Labs Reviewed - No data to display  EKG None  Radiology No results found.  Procedures Procedures    Medications Ordered in ED Medications - No data to display  ED Course/ Medical Decision Making/ A&P                           Medical Decision Making  Patient's vital signs very reassuring no signs of sepsis.  Large chronic stage IV decubitus ulcer.  No active bleeding at this time.  No evidence of  any significant infection in the buttocks area.  No erythema.  Patient needs close follow-up with wound care.  Chart review seems to imply that she has not been seen by them recently.  We will give her wound care follow-up contact information and have her make an appointment to follow-up with them.  No indication really for CBC or basic metabolic panel tonight.  Vital signs very stable  no concerns for any serious infection.  No evidence of any significant bleeding at this time.   Final Clinical Impression(s) / ED Diagnoses Final diagnoses:  Pressure injury of sacral region, stage 4 [facility])    Rx / DC Orders ED Discharge Orders     None         Fredia Sorrow, MD 08/18/21 2345

## 2021-08-18 NOTE — Discharge Instructions (Signed)
Continue your dressing changes a family member is helping you with.  Do not see any appointments for wound care management.  But it may not be in our system.  Referral information provided above to [facility] health wound care center call and set up an appointment.  Also follow back up with your primary care doctor Dr. Bradd Burner.  Wound care is critical to trying to get better healing of this stage IV decubitus ulcer.  Follow-up is very important.

## 2021-08-19 NOTE — ED Notes (Signed)
[facility] arrived to transport pt home.

## 2021-08-19 NOTE — ED Notes (Signed)
Dr Sedonia Small at bedside.  Pt wound cleaned and dressed with wet to dry dressing.

## 2021-08-19 NOTE — ED Notes (Signed)
[facility] notified of patient needing transportation back home. 

## 2021-09-07 ENCOUNTER — Encounter (HOSPITAL_BASED_OUTPATIENT_CLINIC_OR_DEPARTMENT_OTHER): Payer: Medicare Other | Attending: General Surgery | Admitting: Internal Medicine

## 2021-09-07 DIAGNOSIS — J449 Chronic obstructive pulmonary disease, unspecified: Secondary | ICD-10-CM

## 2021-09-07 DIAGNOSIS — Z993 Dependence on wheelchair: Secondary | ICD-10-CM | POA: Insufficient documentation

## 2021-09-07 DIAGNOSIS — Z720 Tobacco use: Secondary | ICD-10-CM | POA: Diagnosis not present

## 2021-09-07 DIAGNOSIS — G35 Multiple sclerosis: Secondary | ICD-10-CM | POA: Insufficient documentation

## 2021-09-07 DIAGNOSIS — L89154 Pressure ulcer of sacral region, stage 4: Secondary | ICD-10-CM | POA: Diagnosis not present

## 2021-09-07 DIAGNOSIS — J439 Emphysema, unspecified: Secondary | ICD-10-CM | POA: Diagnosis not present

## 2021-09-07 NOTE — Progress Notes (Signed)
BRYER, GOTTSCH ([id number]) Visit Report for 09/07/2021 Abuse Risk Screen Details Patient Name: Date of Service: Faith [name], Faith [name] 09/07/2021 9:45 A M Medical Record Number: [id number] Patient Account Number: [id number] Date of Birth/Sex: Treating RN: 11/03/1955 (66 y.o. Faith [name], Faith [name] Primary Care Najee Cowens: Janifer Adie Other Clinician: Referring Sheral Pfahler: Treating Raegyn Renda/Extender: Kalman Shan ZA CKO WSKI, SCO TT Weeks in Treatment: 0 Abuse Risk Screen Items Answer ABUSE RISK SCREEN: Has anyone close to you tried to hurt or harm you recentlyo No Do you feel uncomfortable with anyone in your familyo No Has anyone forced you do things that you didnt want to doo No Electronic Signature(s) Signed: 09/07/2021 4:29:33 PM By: Deon Pilling RN, BSN Entered By: Deon Pilling on 09/07/2021 10:12:17 -------------------------------------------------------------------------------- Activities of Daily Living Details Patient Name: Date of Service: CHANNON, BROUGHER 09/07/2021 9:45 A M Medical Record Number: [id number] Patient Account Number: [id number] Date of Birth/Sex: Treating RN: [birth year] (66 y.o. Faith [name], Faith [name] Primary Care Jahnaya Branscome: Janifer Adie Other Clinician: Referring Alayiah Fontes: Treating Reesha Debes/Extender: Kalman Shan ZA CKO WSKI, [place] TT Weeks in Treatment: 0 Activities of Daily Living Items Answer Activities of Daily Living (Please select one for each item) Drive Automobile Not Able T Medications ake Completely Able Use T elephone Need Assistance Care for Appearance Not Able Use T oilet Need Assistance Bath / Shower Need Assistance Dress Self Need Assistance Feed Self Completely Able Walk Not Able Get In / Out Bed Need Assistance Housework Not Able Prepare Meals Not Person Not Able Shop for Self Not Able Electronic Signature(s) Signed: 09/07/2021 4:29:33 PM By: Deon Pilling RN, BSN Entered By: Deon Pilling on 09/07/2021  10:12:46 -------------------------------------------------------------------------------- Education Screening Details Patient Name: Date of Service: Faith Lack A. 09/07/2021 9:45 A M Medical Record Number: [id number] Patient Account Number: [id number] Date of Birth/Sex: Treating RN: 1955/07/16 (66 y.o. Faith [name], Meta.Reding Primary Care Sophina Mitten: Janifer Adie Other Clinician: Referring Maressa Apollo: Treating Treavor Blomquist/Extender: Kalman Shan ZA CKO [place] Duck, SCO TT Weeks in Treatment: 0 Primary Learner Assessed: Patient Learning Preferences/Education Level/Primary Language Learning Preference: Explanation, Demonstration, Printed Material Highest Education Level: High School Preferred Language: English Cognitive Barrier Language Barrier: No Translator Needed: No Memory Deficit: No Emotional Barrier: No Cultural/Religious Beliefs Affecting Medical Care: No Physical Barrier Impaired Vision: No Impaired Hearing: No Decreased Hand dexterity: No Knowledge/Comprehension Knowledge Level: High Comprehension Level: High Ability to understand written instructions: High Ability to understand verbal instructions: High Motivation Anxiety Level: Calm Cooperation: Cooperative Education Importance: Acknowledges Need Interest in Health Problems: Asks Questions Perception: Coherent Willingness to Engage in Self-Management High Activities: Readiness to Engage in Self-Management High Activities: Electronic Signature(s) Signed: 09/07/2021 4:29:33 PM By: Deon Pilling RN, BSN Entered By: Deon Pilling on 09/07/2021 10:13:02 -------------------------------------------------------------------------------- Fall Risk Assessment Details Patient Name: Date of Service: BRO WN, [name] 09/07/2021 9:45 A M Medical Record Number: [id number] Patient Account Number: [id number] Date of Birth/Sex: Treating RN: Sep 13, 1955 (66 y.o. Faith [name], Meta.Reding Primary Care Baelyn Doring: Janifer Adie Other  Clinician: Referring Grecia Lynk: Treating Boris Engelmann/Extender: Kalman Shan ZA CKO WSKI, SCO TT Weeks in Treatment: 0 Fall Risk Assessment Items Have you had 2 or more falls in the last 12 monthso 0 Yes Have you had any fall that resulted in injury in the last 12 monthso 0 No FALLS RISK SCREEN History of falling - immediate or within 3 months 0 No Secondary diagnosis (Do you have 2 or more medical diagnoseso) 0 No Ambulatory aid None/bed rest/wheelchair/nurse 0 Yes Crutches/cane/walker 0 No  Furniture 0 No Intravenous therapy Access/Saline/Heparin Lock 0 No Gait/Transferring Normal/ bed rest/ wheelchair 0 Yes Weak (short steps with or without shuffle, stooped but able to lift head while walking, may seek 0 No support from furniture) Impaired (short steps with shuffle, may have difficulty arising from chair, head down, impaired 0 No balance) Mental Status Oriented to own ability 0 Yes Electronic Signature(s) Signed: 09/07/2021 4:29:33 PM By: Deon Pilling RN, BSN Entered By: Deon Pilling on 09/07/2021 10:13:11 -------------------------------------------------------------------------------- Foot Assessment Details Patient Name: Date of Service: Sherrill [name], [name] 09/07/2021 9:45 A M Medical Record Number: [id number] Patient Account Number: [id number] Date of Birth/Sex: Treating RN: 03-09-1955 (66 y.o. [name] Primary Care Jojo Pehl: Janifer Adie Other Clinician: Referring Dezmen Alcock: Treating Whitney Hillegass/Extender: Kalman Shan ZA CKO WSKI, SCO TT Weeks in Treatment: 0 Foot Assessment Items Site Locations + = Sensation present, - = Sensation absent, C = Callus, U = Ulcer R = Redness, W = Warmth, M = Maceration, PU = Pre-ulcerative lesion F = Fissure, S = Swelling, D = Dryness Assessment Right: Left: Other Deformity: No No Prior Foot Ulcer: No No Prior Amputation: No No Charcot Joint: No No Ambulatory Status: Gait: Notes NO BLE wounds. Electronic  Signature(s) Signed: 09/07/2021 4:29:33 PM By: Deon Pilling RN, BSN Entered By: Deon Pilling on 09/07/2021 10:13:41 -------------------------------------------------------------------------------- Nutrition Risk Screening Details Patient Name: Date of Service: ROMELL, CAVANAH A. 09/07/2021 9:45 A M Medical Record Number: [id number] Patient Account Number: [id number] Date of Birth/Sex: Treating RN: October 14, 1955 (66 y.o. [name] Primary Care Tania Perrott: Janifer Adie Other Clinician: Referring Kalle Bernath: Treating Lyriq Finerty/Extender: Kalman Shan ZA CKO WSKI, SCO TT Weeks in Treatment: 0 Height (in): Weight (lbs): Body Mass Index (BMI): Nutrition Risk Screening Items Score Screening NUTRITION RISK SCREEN: I have an illness or condition that made me change the kind and/or amount of food I eat 2 Yes I eat fewer than two meals per day 0 No I eat few fruits and vegetables, or milk products 0 No I have three or more drinks of beer, liquor or wine almost every day 0 No I have tooth or mouth problems that make it hard for me to eat 0 No I don't always have enough money to buy the food I need 0 No I eat alone most of the time 0 No I take three or more different prescribed or over-the-counter drugs a day 1 Yes Without wanting to, I have lost or gained 10 pounds in the last six months 0 No I am not always physically able to shop, cook and/or feed myself 0 No Nutrition Protocols Good Risk Protocol Moderate Risk Protocol 0 Provide education on nutrition High Risk Proctocol Risk Level: Moderate Risk Score: 3 Electronic Signature(s) Signed: 09/07/2021 4:29:33 PM By: Deon Pilling RN, BSN Entered By: Deon Pilling on 09/07/2021 10:13:34

## 2021-09-07 NOTE — Progress Notes (Signed)
Faith, [name] ([id number]) Visit Report for 09/07/2021 Chief Complaint Document Details Patient Name: Date of Service: Faith, [name] 09/07/2021 9:45 A M Medical Record Number: [id number] Patient Account Number: [id number] Date of Birth/Sex: Treating RN: December 07, 1955 (66 y.o. [name] Primary Care Provider: Janifer Adie Other Clinician: Referring Provider: Treating Provider/Extender: Kalman Shan ZA CKO WSKI, [place] TT Weeks in Treatment: 0 Information Obtained from: Patient Chief Complaint 09/07/2021; stage IV sacral decubitus ulcer Electronic Signature(s) Signed: 09/07/2021 12:23:53 PM By: Kalman Shan DO Entered By: Kalman Shan on 09/07/2021 12:10:12 -------------------------------------------------------------------------------- HPI Details Patient Name: Date of Service: Faith [name], [name] 09/07/2021 9:45 A M Medical Record Number: [id number] Patient Account Number: [id number] Date of Birth/Sex: Treating RN: 1955-12-16 (66 y.o. [name] Primary Care Provider: Janifer Adie Other Clinician: Referring Provider: Treating Provider/Extender: Kalman Shan ZA Fae Pippin, [place] TT Weeks in Treatment: 0 History of Present Illness HPI Description: Admission 09/07/2021 Ms. 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 She has been using Hydrofera Blue to the wound bed. [facility] a [facility] She follows with Dr. Bradd Burner. She currently denies any issues and states the wound has been stable. She currently denies signs of infection. She had an MRI of the lumbar spine on 04/12/2021 at that time they noted no evidence of osteomyelitis to the sacrum or lumbar spine. She reports sleeping in a recliner. She does have a hospital bed but does not have an air mattress. She is able to ambulate with a walker however  mostly remains wheelchair-bound. Electronic Signature(s) Signed: 09/07/2021 12:23:53 PM By: Kalman Shan DO Entered By: Kalman Shan on 09/07/2021 12:15:05 -------------------------------------------------------------------------------- Physical Exam Details Patient Name: Date of Service: Faith [name] A. 09/07/2021 9:45 A M Medical Record Number: [id number] Patient Account Number: [id number] Date of Birth/Sex: Treating RN: [birth year] (66 y.o. [name] Primary Care Provider: Janifer Adie Other Clinician: Referring Provider: Treating Provider/Extender: Kalman Shan ZA CKO WSKI, [place] TT Weeks in Treatment: 0 Constitutional respirations regular, non-labored and within target range for patient.Marland Kitchen Psychiatric pleasant and cooperative. Notes Sacrum: Open wound with undermining circumferentially and pale granulation tissue at the opening. No signs of surrounding soft tissue infection. Electronic Signature(s) Signed: 09/07/2021 12:23:53 PM By: Kalman Shan DO Entered By: Kalman Shan on 09/07/2021 12:15:28 -------------------------------------------------------------------------------- Physician Orders Details Patient Name: Date of Service: Faith [name], [name] 09/07/2021 9:45 A M Medical Record Number: [id number] Patient Account Number: [id number] Date of Birth/Sex: Treating RN: [birth year] (66 y.o. [name], [name] Primary Care Provider: Janifer Adie Other Clinician: Referring Provider: Treating Provider/Extender: Kalman Shan ZA CKO WSKI, [place] TT Weeks in Treatment: 0 Verbal / Phone Orders: No Diagnosis Coding ICD-10 Coding Code Description L89.154 Pressure ulcer of sacral region, stage 4 G35 Multiple sclerosis J44.9 Chronic obstructive pulmonary disease, unspecified Z72.0 Tobacco use Follow-up Appointments ppointment in 2 weeks. - Dr. Heber [place] and [place], Room 8 1115 09/21/2021 Monday Return A Other: - [facility] for dressing change threes times  a week due to Ordering a wound vac black and white dressing. In the meantime, dakin's solution wet to dry dressings daily. Wound Vac ordered through [facility]. Bathing/ Shower/ Hygiene May shower with protection but do not get wound dressing(s) wet. Negative Presssure Wound Therapy Wound #1 Sacrum Wound Vac to wound continuously at  165m/hg pressure - apply three times a week by [facility]. Black and White Foam combination Off-Loading Low air-loss mattress (Group 2) - Placing an order for group 2 through [facility]. The Rep will call you to deliver the mattress and wound vac. Turn and reposition every 2 hours [place]wound care orders this week; continue Home Health for wound care. May utilize formulary equivalent dressing for wound treatment orders unless otherwise specified. - wound vac ordered and to be placed three times a week. Until wound vac arrives Dakin's Wet to dry dressings daily. Other Home Health Orders/Instructions: - [facility] to change three times a week wound vac. Wound Treatment Wound #1 - Sacrum Cleanser: Wound Cleanser (Home Health) 1 x Per Day/30 Days Discharge Instructions: Cleanse the wound with wound cleanser prior to applying a clean dressing using gauze sponges, not tissue or cotton balls. Peri-Wound Care: Skin Prep (Home Health) 1 x Per Day/30 Days Discharge Instructions: Use skin prep as directed Prim Dressing: Dakin's Solution 0.25%, 16 (oz) 1 x Per Day/30 Days ary Discharge Instructions: Moisten gauze with Dakin's solution lightly pack into undermining and wound bed. Once wound vac arrives discontinue the Dakin's. Secondary Dressing: Zetuvit Plus Silicone Border Dressing 7x7(in/in) (Home Health) 1 x Per Day/30 Days Discharge Instructions: Apply silicone border over primary dressing as directed. Electronic Signature(s) Signed: 09/07/2021 12:23:53 PM By: HKalman ShanDO Signed: 09/07/2021 4:29:33 PM By: DDeon PillingRN, BSN Entered By: DDeon Pillingon 09/07/2021 12:21:31 -------------------------------------------------------------------------------- Problem List Details Patient Name: Date of Service: BSherrill [name] [name] 09/07/2021 9:45 A M Medical Record Number: [id number] Account Number: [id number] of Birth/Sex: Treating RN: 1October 16, 1957(66y.o. FHelene Shoe BTammi KlippelPrimary Care Provider: KJanifer AdieOther Clinician: Referring Provider: Treating Provider/Extender: HKalman ShanZA CKO WSKI, [place]TT Weeks in Treatment: 0 Active Problems ICD-10 Encounter Code Description Active Date MDM Diagnosis L89.154 Pressure ulcer of sacral region, stage 4 09/07/2021 No Yes G35 Multiple sclerosis 09/07/2021 No Yes J44.9 Chronic obstructive pulmonary disease, unspecified 09/07/2021 No Yes Z72.0 Tobacco use 09/07/2021 No Yes Inactive Problems Resolved Problems Electronic Signature(s) Signed: 09/07/2021 12:23:53 PM By: HKalman ShanDO Entered By: HKalman Shanon 09/07/2021 12:08:42 -------------------------------------------------------------------------------- Progress Note Details Patient Name: Date of Service: BSherrill [name] Tymara A. 09/07/2021 9:45 A M Medical Record Number: [id number] Account Number: [id number] of Birth/Sex: Treating RN: 1September 24, 1957(66y.o. FDebby BudPrimary Care Provider: KJanifer AdieOther Clinician: Referring Provider: Treating Provider/Extender: HKalman ShanZA CFae Pippin [place]TT Weeks in Treatment: 0 Subjective Chief Complaint Information obtained from Patient 09/07/2021; stage IV sacral decubitus ulcer History of Present Illness (HPI) Admission 09/07/2021 Ms. 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 She has been using Hydrofera Blue to the wound bed. [facility] She follows with Dr. KBradd Burner She currently denies any issues and states the wound has been stable. She currently denies signs of infection. She had an MRI of the lumbar spine on 04/12/2021 at that time they noted no evidence of osteomyelitis to the sacrum or lumbar spine. She reports sleeping in a recliner. She does have a hospital bed but does not have an air mattress. She is able to ambulate with a walker however mostly remains wheelchair-bound. Patient History Information obtained from  Chart. Allergies Darvon (Reaction: hallicuations), penicillin (Reaction: rash) Family History Cancer - Maternal Grandparents, Diabetes - Mother, Heart Disease - Mother,Father,Siblings,Maternal Grandparents, Hypertension - Mother,Father,Siblings, Kidney Disease - Mother, Seizures - Siblings. Social History Current every day smoker - 0.25 pack a day, Alcohol Use - Never, Drug Use - No History, Caffeine Use - Never. Medical History Hematologic/Lymphatic Patient has history of Anemia Respiratory Patient has history of Chronic Obstructive Pulmonary Disease (COPD) Cardiovascular Patient has history of Hypertension Musculoskeletal Patient has history of Osteoarthritis Hospitalization/Surgery History - 01/19/2019 cervical laminectomy. - ORIF facial fx. - 03/2020 cardioversion. Medical A Surgical History Notes nd Constitutional Symptoms (General Health) MS Respiratory emphysema Cardiovascular hyperlipidemia PVD aortic atherosclerosis Musculoskeletal MS DJD cervical myelopathy spondylolisthesis of the cervical region. 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 Vitals Time Taken: 10:10 AM, Height: 65 in, Source: Stated, Weight: 101 lbs, Source: Stated, BMI: 16.8, Temperature: 97.9 F, Pulse: 67 bpm, Respiratory Rate: 16 breaths/min, Blood Pressure: 115/77 mmHg. Psychiatric pleasant and  cooperative. General Notes: Sacrum: Open wound with undermining circumferentially and pale granulation tissue at the opening. No signs of surrounding soft tissue infection. Integumentary (Hair, Skin) Wound #1 status is Open. Original cause of wound was Pressure Injury. The date acquired was: 08/29/2020. The wound is located on the Sacrum. The wound measures 6cm length x 4cm width x 1.5cm depth; 18.85cm^2 area and 28.274cm^3 volume. There is muscle and Fat Layer (Subcutaneous Tissue) exposed. There is no tunneling noted, however, there is undermining starting at 12:00 and ending at 12:00 with a maximum distance of 3.7cm. There is a large amount of serosanguineous drainage noted. The wound margin is epibole. There is large (67-100%) red, pink granulation within the wound bed. There is no necrotic tissue within the wound bed. Assessment Active Problems ICD-10 Pressure ulcer of sacral region, stage 4 Multiple sclerosis Chronic obstructive pulmonary disease, unspecified Tobacco use Patient presents with a 1 year history of nonhealing pressure ulcer to the sacrum. She is limited in mobility due to her multiple sclerosis. We had a long discussion about the importance of aggressive offloading for wound healing. She expressed understanding .I recommended she use an air flow mattress on her hospital bed. We will order this. Imaging did not show evidence of osteomyelitis to the sacrum. At this time I recommended Dakin's wet-to-dry dressings and we will order a wound VAC. She can stop the Dakin's and start the wound VAC when this arrives. We also discussed the importance of protein intake in wound healing. I recommended she increase her protein intake. Her albumin is 3.0. If patient is unable to maximize her nutritional status and do aggressive offloading this wound will not heal. She is well aware of this. Plan Follow-up Appointments: Return Appointment in 2 weeks. - Dr. Heber [place] and [place], Room 8 1115  09/21/2021 Other: - [facility] to order home health for patient to have dressing change threes times a week due to Ordering a wound vac black and white dressing. In the meantime, dakin's solution wet to dry dressings daily. Wound Vac ordered through [facility] Bathing/ Shower/ Hygiene: May shower with protection but do not get wound dressing(s) wet. Negative Presssure Wound Therapy: Wound #1 Sacrum: Wound Vac to wound continuously at 143m/hg pressure - apply three times a week. Black and White Foam combination Off-Loading: Low air-loss mattress (Group 2) - Placing an order for group 2 through [facility]. The Rep will call you to deliver the mattress and wound  vac. Turn and reposition every 2 hours Home Health: New wound care orders this week; continue Home Health for wound care. May utilize formulary equivalent dressing for wound treatment orders unless otherwise specified. - wound vac ordered and to be placed three times a week. Until wound vac arrives Dakin's Wet to dry dressings daily. Other Home Health Orders/Instructions: - [facility] to place order for home health to change three times a week wound vac changes. WOUND #1: - Sacrum Wound Laterality: Cleanser: Wound Cleanser (Home Health) 1 x Per Day/30 Days Discharge Instructions: Cleanse the wound with wound cleanser prior to applying a clean dressing using gauze sponges, not tissue or cotton balls. Peri-Wound Care: Skin Prep (Home Health) 1 x Per Day/30 Days Discharge Instructions: Use skin prep as directed Prim Dressing: Dakin's Solution 0.25%, 16 (oz) 1 x Per Day/30 Days ary Discharge Instructions: Moisten gauze with Dakin's solution lightly pack into undermining and wound bed. Once wound vac arrives discontinue the Dakin's. Secondary Dressing: Zetuvit Plus Silicone Border Dressing 7x7(in/in) (Home Health) 1 x Per Day/30 Days Discharge Instructions: Apply silicone border over primary dressing as  directed. 1. Dakin's wet-to-dry dressings 2. Aggressive offloadingooair flow mattress ordered 3. Order wound VAC 4. Follow-up in 2 weeks Electronic Signature(s) Signed: 09/07/2021 12:23:53 PM By: Kalman Shan DO Entered By: Kalman Shan on 09/07/2021 12:22:59 -------------------------------------------------------------------------------- HxROS Details Patient Name: Date of Service: Faith [name], [place] A. 09/07/2021 9:45 A M Medical Record Number: [id number] Patient Account Number: [id number] Date of Birth/Sex: Treating RN: 02-06-1956 (66 y.o. [name], [name] Primary Care Provider: Janifer Adie Other Clinician: Referring Provider: Treating Provider/Extender: Kalman Shan ZA CKO WSKI, [place] TT Weeks in Treatment: 0 Information Obtained From Chart Constitutional Symptoms (General Health) Medical History: Past Medical History Notes: MS Hematologic/Lymphatic Medical History: Positive for: Anemia Respiratory Medical History: Positive for: Chronic Obstructive Pulmonary Disease (COPD) Past Medical History Notes: emphysema Cardiovascular Medical History: Positive for: Hypertension Past Medical History Notes: hyperlipidemia PVD aortic atherosclerosis Musculoskeletal Medical History: Positive for: Osteoarthritis Past Medical History Notes: MS DJD cervical myelopathy spondylolisthesis of the cervical region. 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 Immunizations Pneumococcal Vaccine: Received Pneumococcal Vaccination: Yes Received Pneumococcal Vaccination On or After 60th Birthday: Yes Implantable Devices No devices added Hospitalization / Surgery History Type of Hospitalization/Surgery 01/19/2019 cervical laminectomy ORIF facial fx 03/2020 cardioversion Family and Social History Cancer: Yes - Maternal Grandparents; Diabetes: Yes - Mother; Heart Disease: Yes - Mother,Father,Siblings,Maternal Grandparents;  Hypertension: Yes - Mother,Father,Siblings; Kidney Disease: Yes - Mother; Seizures: Yes - Siblings; Current every day smoker - 0.25 pack a day; Alcohol Use: Never; Drug Use: No History; Caffeine Use: Never; Financial Concerns: No; Food, Clothing or Shelter Needs: No; Support System Lacking: No; Transportation Concerns: No Electronic Signature(s) Signed: 09/07/2021 12:23:53 PM By: Kalman Shan DO Signed: 09/07/2021 4:29:33 PM By: Deon Pilling RN, BSN Signed: 09/07/2021 4:29:33 PM By: Deon Pilling RN, BSN Entered By: Deon Pilling on 09/07/2021 10:17:52 -------------------------------------------------------------------------------- SuperBill Details Patient Name: Date of Service: Faith [name], Chaquita A. 09/07/2021 Medical Record Number: [id number] Patient Account Number: [id number] Date of Birth/Sex: Treating RN: [birth year] (66 y.o. [name] Primary Care Provider: Janifer Adie Other Clinician: Referring Provider: Treating Provider/Extender: Kalman Shan ZA CKO WSKI, S[place] TT Weeks in Treatment: 0 Diagnosis Coding ICD-10 Codes Code Description L89.154 Pressure ulcer of sacral region, stage 4 G35 Multiple sclerosis J44.9 Chronic obstructive pulmonary disease, unspecified Z72.0 Tobacco use Facility Procedures CPT4 Code: [id number] Description: [phone] - WOUND CARE VISIT-LEV 5  EST PT Modifier: Quantity: 1 Physician Procedures : CPT4 Code Description Modifier [id number] - WC PHYS LEVEL 4 - EST PT ICD-10 Diagnosis Description L89.154 Pressure ulcer of sacral region, stage 4 G35 Multiple sclerosis J44.9 Chronic obstructive pulmonary disease, unspecified Z72.0 Tobacco use Quantity: 1 Electronic Signature(s) Signed: 09/07/2021 12:23:53 PM By: Kalman Shan DO Entered By: Kalman Shan on 09/07/2021 12:23:13

## 2021-09-08 NOTE — Progress Notes (Signed)
ARSEMA, TUSING ([id number]) Visit Report for 09/07/2021 Allergy List Details Patient Name: Date of Service: Faith [name], Faith [name] 09/07/2021 9:45 A M Medical Record Number: [id number] Patient Account Number: [id number] Date of Birth/Sex: Treating RN: Jun 04, 1955 (66 y.o. Faith [name] Primary Care Faith [name]: Faith [name] Other Clinician: Referring Faith [name]: Treating Faith [name]/Extender: Kalman Shan ZA CKO WSKI, [facility]CO TT Weeks in Treatment: 0 Allergies Active Allergies Darvon Reaction: hallicuations penicillin Reaction: rash Allergy Notes Electronic Signature(s) Signed: 09/07/2021 4:29:33 PM By: Deon Pilling RN, BSN Entered By: Deon Pilling on 09/04/2021 08:58:20 -------------------------------------------------------------------------------- Arrival Information Details Patient Name: Date of Service: Faith [name], [name] 09/07/2021 9:45 A M Medical Record Number: [id number] Patient Account Number: [id number] Date of Birth/Sex: Treating RN: Mar 14, 1955 (66 y.o. Faith [name], Faith [name] Primary Care Faith [name]: Faith [name] Other Clinician: Referring Faith [name]: Treating Faith [name]/Extender: Kalman Shan ZA CKO WSKI, [facility] TT Weeks in Treatment: 0 Visit Information Patient Arrived: Wheel Chair Arrival Time: 10:10 Accompanied By: self Transfer Assistance: Transfer Board Patient Identification Verified: Yes Secondary Verification Process Completed: Yes Patient Requires Transmission-Based Precautions: No Patient Has Alerts: No Electronic Signature(s) Signed: 09/07/2021 4:29:33 PM By: Deon Pilling RN, BSN Entered By: Deon Pilling on 09/07/2021 10:11:49 -------------------------------------------------------------------------------- Clinic Level of Care Assessment Details Patient Name: Date of Service: Faith [name], [name] 09/07/2021 9:45 A M Medical Record Number: [id number] Patient Account Number: [id number] Date of Birth/Sex: Treating RN: 02-17-1956 (66 y.o. Faith [name] Primary Care  Faith [name]: Faith [name] Other Clinician: Referring Pietrina Jagodzinski: Treating Faith [name]/Extender: Kalman Shan ZA CKO WSKI, [facility] TT Weeks in Treatment: 0 Clinic Level of Care Assessment Items TOOL 2 Quantity Score X- 1 0 Use when only an EandM is performed on the INITIAL visit ASSESSMENTS - Nursing Assessment / Reassessment X- 1 20 General Physical Exam (combine w/ comprehensive assessment (listed just below) when performed on new pt. evals) X- 1 25 Comprehensive Assessment (HX, ROS, Risk Assessments, Wounds Hx, etc.) ASSESSMENTS - Wound and Skin A ssessment / Reassessment '[]'$  - 0 Simple Wound Assessment / Reassessment - one wound X- 1 5 Complex Wound Assessment / Reassessment - multiple wounds X- 1 10 Dermatologic / Skin Assessment (not related to wound area) ASSESSMENTS - Ostomy and/or Continence Assessment and Care '[]'$  - 0 Incontinence Assessment and Management '[]'$  - 0 Ostomy Care Assessment and Management (repouching, etc.) PROCESS - Coordination of Care '[]'$  - 0 Simple Patient / Family Education for ongoing care X- 1 20 Complex (extensive) Patient / Family Education for ongoing care X- 1 10 Staff obtains Programmer, systems, Records, T Results / Process Orders est X- 1 10 Staff telephones HHA, Nursing Homes / Clarify orders / etc '[]'$  - 0 Routine Transfer to another Facility (non-emergent condition) '[]'$  - 0 Routine Hospital Admission (non-emergent condition) X- 1 15 New Admissions / Biomedical engineer / Ordering NPWT Apligraf, etc. , '[]'$  - 0 Emergency Hospital Admission (emergent condition) '[]'$  - 0 Simple Discharge Coordination X- 1 15 Complex (extensive) Discharge Coordination PROCESS - Special Needs '[]'$  - 0 Pediatric / Minor Patient Management '[]'$  - 0 Isolation Patient Management '[]'$  - 0 Hearing / Language / Visual special needs '[]'$  - 0 Assessment of Community assistance (transportation, D/C planning, etc.) '[]'$  - 0 Additional assistance / Altered mentation X- 1  15 Support Surface(s) Assessment (bed, cushion, seat, etc.) INTERVENTIONS - Wound Cleansing / Measurement X- 1 5 Wound Imaging (photographs - any number of wounds) '[]'$  - 0 Wound Tracing (instead of photographs) X- 1 5 Simple Wound Measurement - one wound '[]'$  - 0  Complex Wound Measurement - multiple wounds X- 1 5 Simple Wound Cleansing - one wound '[]'$  - 0 Complex Wound Cleansing - multiple wounds INTERVENTIONS - Wound Dressings '[]'$  - 0 Small Wound Dressing one or multiple wounds X- 1 15 Medium Wound Dressing one or multiple wounds '[]'$  - 0 Large Wound Dressing one or multiple wounds '[]'$  - 0 Application of Medications - injection INTERVENTIONS - Miscellaneous '[]'$  - 0 External ear exam '[]'$  - 0 Specimen Collection (cultures, biopsies, blood, body fluids, etc.) '[]'$  - 0 Specimen(s) / Culture(s) sent or taken to Lab for analysis '[]'$  - 0 Patient Transfer (multiple staff / Civil Service fast streamer / Similar devices) '[]'$  - 0 Simple Staple / Suture removal (25 or less) '[]'$  - 0 Complex Staple / Suture removal (26 or more) '[]'$  - 0 Hypo / Hyperglycemic Management (close monitor of Blood Glucose) X- 1 15 Ankle / Brachial Index (ABI) - do not check if billed separately Has the patient been seen at the hospital within the last three years: Yes Total Score: 190 Level Of Care: New/Established - Level 5 Electronic Signature(s) Signed: 09/07/2021 4:29:33 PM By: Deon Pilling RN, BSN Entered By: Deon Pilling on 09/07/2021 11:32:52 -------------------------------------------------------------------------------- Encounter Discharge Information Details Patient Name: Date of Service: Faith [name], [name] 09/07/2021 9:45 A M Medical Record Number: [id number] Patient Account Number: [id number] Date of Birth/Sex: Treating RN: 1955-04-04 (66 y.o. Faith [name], Faith [name] Primary Care Vivien Barretto: Faith [name] Other Clinician: Referring Jamaira Sherk: Treating Leaha Cuervo/Extender: Kalman Shan ZA CKO WSKI, [place] TT Weeks in Treatment:  0 Encounter Discharge Information Items Discharge Condition: Stable Ambulatory Status: Wheelchair Discharge Destination: Home Transportation: Private Auto Accompanied By: self Schedule Follow-up Appointment: Yes Clinical Summary of Care: Electronic Signature(s) Signed: 09/07/2021 4:29:33 PM By: Deon Pilling RN, BSN Entered By: Deon Pilling on 09/07/2021 11:33:23 -------------------------------------------------------------------------------- Lower Extremity Assessment Details Patient Name: Date of Service: Faith [name], [name] 09/07/2021 9:45 A M Medical Record Number: [id number] Patient Account Number: [id number] Date of Birth/Sex: Treating RN: 09/19/1955 (66 y.o. Faith [name] Primary Care Liese Dizdarevic: Faith [name] Other Clinician: Referring Teonia Yager: Treating Anusha Claus/Extender: Kalman Shan ZA CKO WSKI, [place] TT Weeks in Treatment: 0 Notes No BLE wounds. Electronic Signature(s) Signed: 09/07/2021 4:29:33 PM By: Deon Pilling RN, BSN Entered By: Deon Pilling on 09/07/2021 10:13:53 -------------------------------------------------------------------------------- Multi Wound Chart Details Patient Name: Date of Service: Faith [name], [name] 09/07/2021 9:45 A M Medical Record Number: [id number] Patient Account Number: [id number] Date of Birth/Sex: Treating RN: 06-Dec-1955 (66 y.o. Faith [name], Meta.Reding Primary Care Josian Lanese: Faith [name] Other Clinician: Referring Sharlette Jansma: Treating Evaline Waltman/Extender: Kalman Shan ZA CKO WSKI, SCO TT Weeks in Treatment: 0 Vital Signs Height(in): 65 Pulse(bpm): 67 Weight(lbs): 101 Blood Pressure(mmHg): 115/77 Body Mass Index(BMI): 16.8 Temperature(F): 97.9 Respiratory Rate(breaths/min): 16 Photos: [N/A:N/A] Sacrum N/A N/A Wound Location: Pressure Injury N/A N/A Wounding Event: Pressure Ulcer N/A N/A Primary Etiology: Anemia, Chronic Obstructive N/A N/A Comorbid History: Pulmonary Disease (COPD), Hypertension,  Osteoarthritis 08/29/2020 N/A N/A Date Acquired: 0 N/A N/A Weeks of Treatment: Open N/A N/A Wound Status: No N/A N/A Wound Recurrence: 6x4x1.5 N/A N/A Measurements L x W x D (cm) 18.85 N/A N/A A (cm) : rea 28.274 N/A N/A Volume (cm) : 0.00% N/A N/A % Reduction in A rea: 0.00% N/A N/A % Reduction in Volume: 12 Starting Position 1 (o'clock): 12 Ending Position 1 (o'clock): 3.7 Maximum Distance 1 (cm): Yes N/A N/A Undermining: Category/Stage IV N/A N/A Classification: Large N/A N/A Exudate A mount: Serosanguineous N/A N/A Exudate Type: red, [place] N/A  N/A Exudate Color: Epibole N/A N/A Wound Margin: Large (67-100%) N/A N/A Granulation A mount: Red, Pink N/A N/A Granulation Quality: None Present (0%) N/A N/A Necrotic A mount: Fat Layer (Subcutaneous Tissue): Yes N/A N/A Exposed Structures: Muscle: Yes Fascia: No Tendon: No Joint: No Bone: No None N/A N/A Epithelialization: Treatment Notes Wound #1 (Sacrum) Cleanser Wound Cleanser Discharge Instruction: Cleanse the wound with wound cleanser prior to applying a clean dressing using gauze sponges, not tissue or cotton balls. Peri-Wound Care Skin Prep Discharge Instruction: Use skin prep as directed Topical Primary Dressing Dakin's Solution 0.25%, 16 (oz) Discharge Instruction: Moisten gauze with Dakin's solution lightly pack into undermining and wound bed. Once wound vac arrives discontinue the Dakin's. Secondary Dressing Zetuvit Plus Silicone Border Dressing 7x7(in/in) Discharge Instruction: Apply silicone border over primary dressing as directed. Secured With Compression Wrap Compression Stockings Environmental education officer) Signed: 09/07/2021 12:23:53 PM By: Kalman Shan DO Signed: 09/07/2021 4:29:33 PM By: Deon Pilling RN, BSN Entered By: Kalman Shan on 09/07/2021 12:09:39 -------------------------------------------------------------------------------- Multi-Disciplinary Care Plan  Details Patient Name: Date of Service: Faith Lack A. 09/07/2021 9:45 A M Medical Record Number: [id number] Patient Account Number: [id number] Date of Birth/Sex: Treating RN: 07-May-1955 (66 y.o. Faith [name], Faith [name] Primary Care Julus Kelley: Faith [name] Other Clinician: Referring Debra Colon: Treating Aiva Miskell/Extender: Kalman Shan ZA CKO WSKI, [facility] TT Weeks in Treatment: 0 Active Inactive Orientation to the Wound Care Program Nursing Diagnoses: Knowledge deficit related to the wound healing center program Goals: Patient/caregiver will verbalize understanding of the [place] Date Initiated: 09/07/2021 Target Resolution Date: 10/02/2021 Goal Status: Active Interventions: Provide education on orientation to the wound center Notes: Pressure Nursing Diagnoses: Potential for impaired tissue integrity related to pressure, friction, moisture, and shear Goals: Patient will remain free from development of additional pressure ulcers Date Initiated: 09/07/2021 Target Resolution Date: 10/02/2021 Goal Status: Active Patient/caregiver will verbalize understanding of pressure ulcer management Date Initiated: 09/07/2021 Target Resolution Date: 10/02/2021 Goal Status: Active Interventions: Assess: immobility, friction, shearing, incontinence upon admission and as needed Notes: Electronic Signature(s) Signed: 09/07/2021 4:29:33 PM By: Deon Pilling RN, BSN Entered By: Deon Pilling on 09/07/2021 10:42:49 -------------------------------------------------------------------------------- Pain Assessment Details Patient Name: Date of Service: Faith Lack A. 09/07/2021 9:45 A M Medical Record Number: [id number] Patient Account Number: [id number] Date of Birth/Sex: Treating RN: [birth year] (66 y.o. Faith [name] Primary Care Danial Hlavac: Faith [name] Other Clinician: Referring Elsye Mccollister: Treating Timonthy Hovater/Extender: Kalman Shan ZA CKO WSKI, [facility] TT Weeks in Treatment:  0 Active Problems Location of Pain Severity and Description of Pain Patient Has Paino Yes Site Locations Pain Location: Pain in Ulcers Rate the pain. Current Pain Level: 7 Pain Management and Medication Current Pain Management: Medication: No Cold Application: No Rest: No Massage: No Activity: No T.E.N.S.: No Heat Application: No Leg drop or elevation: No Is the Current Pain Management Adequate: Adequate How does your wound impact your activities of daily livingo Sleep: No Bathing: No Appetite: No Relationship With Others: No Bladder Continence: No Emotions: No Bowel Continence: No Work: No Toileting: No Drive: No Dressing: No Hobbies: No Engineer, maintenance) Signed: 09/07/2021 4:29:33 PM By: Deon Pilling RN, BSN Entered By: Deon Pilling on 09/07/2021 10:14:07 -------------------------------------------------------------------------------- Patient/Caregiver Education Details Patient Name: Date of Service: Faith [name] 7/10/2023andnbsp9:45 A M Medical Record Number: [id number] Patient Account Number: [id number] Date of Birth/Gender: Treating RN: [birth year] (66 y.o. Faith [name], Faith [name] Primary Care Physician: Faith [name] Other Clinician: Referring Physician: Treating Physician/Extender: Kalman Shan ZA Fae Pippin, [place]  TT Weeks in Treatment: 0 Education Assessment Education Provided To: Patient Education Topics Provided Welcome T [facility] [place]: o Handouts: Welcome T [facility] [place] o Methods: Explain/Verbal Responses: Reinforcements needed Electronic Signature(s) Signed: 09/07/2021 4:29:33 PM By: Deon Pilling RN, BSN Entered By: Deon Pilling on 09/07/2021 10:42:57 -------------------------------------------------------------------------------- Wound Assessment Details Patient Name: Date of Service: Faith Lack A. 09/07/2021 9:45 A M Medical Record Number: [id number] Patient Account Number: [id number] Date of  Birth/Sex: Treating RN: 12-29-1955 (66 y.o. Faith [name], Meta.Reding Primary Care Gemayel Mascio: Faith [name] Other Clinician: Referring Olesya Wike: Treating Yazir Koerber/Extender: Kalman Shan ZA CKO WSKI, SCO TT Weeks in Treatment: 0 Wound Status Wound Number: 1 Primary Pressure Ulcer Etiology: Wound Location: Sacrum Wound Open Wounding Event: Pressure Injury Status: Date Acquired: 08/29/2020 Comorbid Anemia, Chronic Obstructive Pulmonary Disease (COPD), Weeks Of Treatment: 0 History: Hypertension, Osteoarthritis Clustered Wound: No Photos Wound Measurements Length: (cm) 6 Width: (cm) 4 Depth: (cm) 1.5 Area: (cm) 18.85 Volume: (cm) 28.274 % Reduction in Area: 0% % Reduction in Volume: 0% Epithelialization: None Tunneling: No Undermining: Yes Starting Position (o'clock): 12 Ending Position (o'clock): 12 Maximum Distance: (cm) 3.7 Wound Description Classification: Category/Stage IV Wound Margin: Epibole Exudate Amount: Large Exudate Type: Serosanguineous Exudate Color: red, [place] Foul Odor After Cleansing: No Slough/Fibrino No Wound Bed Granulation Amount: Large (67-100%) Exposed Structure Granulation Quality: Red, Pink Fascia Exposed: No Necrotic Amount: None Present (0%) Fat Layer (Subcutaneous Tissue) Exposed: Yes Tendon Exposed: No Muscle Exposed: Yes Necrosis of Muscle: No Joint Exposed: No Bone Exposed: No Treatment Notes Wound #1 (Sacrum) Cleanser Wound Cleanser Discharge Instruction: Cleanse the wound with wound cleanser prior to applying a clean dressing using gauze sponges, not tissue or cotton balls. Peri-Wound Care Skin Prep Discharge Instruction: Use skin prep as directed Topical Primary Dressing Dakin's Solution 0.25%, 16 (oz) Discharge Instruction: Moisten gauze with Dakin's solution lightly pack into undermining and wound bed. Once wound vac arrives discontinue the Dakin's. Secondary Dressing Zetuvit Plus Silicone Border Dressing  7x7(in/in) Discharge Instruction: Apply silicone border over primary dressing as directed. Secured With Compression Wrap Compression Stockings Environmental education officer) Signed: 09/07/2021 4:29:33 PM By: Deon Pilling RN, BSN Signed: 09/08/2021 10:34:18 AM By: Erenest Blank Entered By: Erenest Blank on 09/07/2021 10:35:23 -------------------------------------------------------------------------------- Vitals Details Patient Name: Date of Service: Faith [name], Kiaira A. 09/07/2021 9:45 A M Medical Record Number: [id number] Patient Account Number: [id number] Date of Birth/Sex: Treating RN: October 29, 1955 (66 y.o. Faith [name], Meta.Reding Primary Care Gibson Telleria: Faith [name] Other Clinician: Referring Macedonio Scallon: Treating Greta Yung/Extender: Kalman Shan ZA CKO WSKI, [facility] TT Weeks in Treatment: 0 Vital Signs Time Taken: 10:10 Temperature (F): 97.9 Height (in): 65 Pulse (bpm): 67 Source: Stated Respiratory Rate (breaths/min): 16 Weight (lbs): 101 Blood Pressure (mmHg): 115/77 Source: Stated Reference Range: 80 - 120 mg / dl Body Mass Index (BMI): 16.8 Electronic Signature(s) Signed: 09/07/2021 4:29:33 PM By: Deon Pilling RN, BSN Entered By: Deon Pilling on 09/07/2021 10:18:46

## 2021-09-18 ENCOUNTER — Emergency Department (HOSPITAL_COMMUNITY): Payer: Medicare Other

## 2021-09-18 ENCOUNTER — Encounter (HOSPITAL_COMMUNITY): Payer: Self-pay

## 2021-09-18 ENCOUNTER — Emergency Department (HOSPITAL_COMMUNITY)
Admission: EM | Admit: 2021-09-18 | Discharge: 2021-09-18 | Disposition: A | Discharge status: Home / Self Care | Payer: Medicare Other | Source: Home / Self Care | Attending: Emergency Medicine | Admitting: Emergency Medicine

## 2021-09-18 DIAGNOSIS — E785 Hyperlipidemia, unspecified: Secondary | ICD-10-CM | POA: Diagnosis not present

## 2021-09-18 DIAGNOSIS — Z8249 Family history of ischemic heart disease and other diseases of the circulatory system: Secondary | ICD-10-CM | POA: Diagnosis not present

## 2021-09-18 DIAGNOSIS — L89154 Pressure ulcer of sacral region, stage 4: Secondary | ICD-10-CM | POA: Diagnosis not present

## 2021-09-18 DIAGNOSIS — N390 Urinary tract infection, site not specified: Secondary | ICD-10-CM | POA: Diagnosis not present

## 2021-09-18 DIAGNOSIS — Z801 Family history of malignant neoplasm of trachea, bronchus and lung: Secondary | ICD-10-CM | POA: Diagnosis not present

## 2021-09-18 DIAGNOSIS — Z20822 Contact with and (suspected) exposure to covid-19: Secondary | ICD-10-CM | POA: Diagnosis not present

## 2021-09-18 DIAGNOSIS — J439 Emphysema, unspecified: Secondary | ICD-10-CM | POA: Diagnosis not present

## 2021-09-18 DIAGNOSIS — R627 Adult failure to thrive: Secondary | ICD-10-CM | POA: Diagnosis not present

## 2021-09-18 DIAGNOSIS — M869 Osteomyelitis, unspecified: Secondary | ICD-10-CM | POA: Diagnosis present

## 2021-09-18 DIAGNOSIS — E440 Moderate protein-calorie malnutrition: Secondary | ICD-10-CM | POA: Diagnosis not present

## 2021-09-18 DIAGNOSIS — A419 Sepsis, unspecified organism: Secondary | ICD-10-CM | POA: Diagnosis not present

## 2021-09-18 DIAGNOSIS — M25561 Pain in right knee: Secondary | ICD-10-CM

## 2021-09-18 DIAGNOSIS — W1830XA Fall on same level, unspecified, initial encounter: Secondary | ICD-10-CM | POA: Diagnosis present

## 2021-09-18 DIAGNOSIS — D500 Iron deficiency anemia secondary to blood loss (chronic): Secondary | ICD-10-CM | POA: Diagnosis not present

## 2021-09-18 DIAGNOSIS — G35 Multiple sclerosis: Secondary | ICD-10-CM | POA: Diagnosis not present

## 2021-09-18 DIAGNOSIS — L89310 Pressure ulcer of right buttock, unstageable: Secondary | ICD-10-CM | POA: Diagnosis not present

## 2021-09-18 DIAGNOSIS — I10 Essential (primary) hypertension: Secondary | ICD-10-CM | POA: Insufficient documentation

## 2021-09-18 DIAGNOSIS — Z7401 Bed confinement status: Secondary | ICD-10-CM | POA: Diagnosis not present

## 2021-09-18 DIAGNOSIS — I959 Hypotension, unspecified: Secondary | ICD-10-CM | POA: Diagnosis not present

## 2021-09-18 DIAGNOSIS — J449 Chronic obstructive pulmonary disease, unspecified: Secondary | ICD-10-CM | POA: Insufficient documentation

## 2021-09-18 DIAGNOSIS — M4628 Osteomyelitis of vertebra, sacral and sacrococcygeal region: Secondary | ICD-10-CM | POA: Diagnosis not present

## 2021-09-18 DIAGNOSIS — Z79899 Other long term (current) drug therapy: Secondary | ICD-10-CM | POA: Insufficient documentation

## 2021-09-18 DIAGNOSIS — E876 Hypokalemia: Secondary | ICD-10-CM | POA: Diagnosis not present

## 2021-09-18 DIAGNOSIS — Z681 Body mass index (BMI) 19 or less, adult: Secondary | ICD-10-CM | POA: Diagnosis not present

## 2021-09-18 DIAGNOSIS — Z833 Family history of diabetes mellitus: Secondary | ICD-10-CM | POA: Diagnosis not present

## 2021-09-18 DIAGNOSIS — F17210 Nicotine dependence, cigarettes, uncomplicated: Secondary | ICD-10-CM | POA: Diagnosis not present

## 2021-09-18 DIAGNOSIS — Z83438 Family history of other disorder of lipoprotein metabolism and other lipidemia: Secondary | ICD-10-CM | POA: Diagnosis not present

## 2021-09-18 DIAGNOSIS — W19XXXA Unspecified fall, initial encounter: Secondary | ICD-10-CM

## 2021-09-18 DIAGNOSIS — G9341 Metabolic encephalopathy: Secondary | ICD-10-CM | POA: Diagnosis not present

## 2021-09-18 DIAGNOSIS — Z841 Family history of disorders of kidney and ureter: Secondary | ICD-10-CM | POA: Diagnosis not present

## 2021-09-18 NOTE — ED Provider Notes (Signed)
[facility] EMERGENCY DEPARTMENT Provider Note   CSN: [id number] Arrival date & time: 09/18/21  6629     History  Chief Complaint  Patient presents with   Faith [name] is a 66 y.o. female.  Patient is a 66 year old female with past medical history of GERD, hypertension, hyperlipidemia, COPD, sacral decubitus ulcer with wound VAC, and neuromuscular disorder presenting for right-sided knee pain after mechanical fall.  Patient states she was attempting to transfer from the wheelchair to the toilet when she fell forward injuring her right knee and catching herself on her hands.  Denies head trauma or neck trauma.  Denies any back pain at this time.  Admits to to skin tears but no bony pain in the hands.  Event occurred this morning directly prior to arrival.    Patient presenting from home where she lives with her son.  The history is provided by the patient. No language interpreter was used.  Fall Pertinent negatives include no chest pain, no abdominal pain and no shortness of breath.       Home Medications Prior to Admission medications   Medication Sig Start Date End Date Taking? Authorizing Provider  ascorbic acid (VITAMIN C) 500 MG tablet Take 1 tablet (500 mg total) by mouth daily. 02/18/21   Geradine Girt, DO  atorvastatin (LIPITOR) 20 MG tablet Take 20 mg by mouth daily.    [provider]  baclofen (LIORESAL) 10 MG tablet Take 1 tablet (10 mg total) by mouth 3 (three) times daily. 02/17/21   Geradine Girt, DO  celecoxib (CELEBREX) 200 MG capsule Take 1 capsule (200 mg total) by mouth 2 (two) times daily as needed. 04/15/21   Shelly Coss, MD  Cholecalciferol (VITAMIN D3) 50 MCG (2000 UT) capsule Take 2,000 Units by mouth daily. 02/11/21   [provider]  Cladribine, 4 Tabs, (MAVENCLAD, 4 TABS,) 10 MG TBPK Take 10 mg by mouth as directed. Take 1 tablet PO each day for 4 days. Repeat cycle one month later.    Sater, Nanine Means, MD  diazepam  (VALIUM) 5 MG tablet Take 1 tablet (5 mg total) by mouth See admin instructions. TAKE (1) TABLET BY MOUTH EVERY SIX HOURS AS NEEDED FOR MUSCLE SPASMS. 02/17/21   Geradine Girt, DO  feeding supplement (ENSURE ENLIVE / ENSURE PLUS) LIQD Take 237 mLs by mouth 3 (three) times daily between meals. 12/12/20   Alma Friendly, MD  gabapentin (NEURONTIN) 300 MG capsule Take 2 capsules (600 mg total) by mouth 3 (three) times daily. 02/05/19   Angiulli, Lavon Paganini, PA-C  HYDROcodone-acetaminophen (NORCO/VICODIN) 5-325 MG tablet Take 1 tablet by mouth every 6 (six) hours as needed for moderate pain. 02/17/21   Geradine Girt, DO  polyethylene glycol (MIRALAX) 17 g packet Take 17 g by mouth daily. 12/25/20   Fredia Sorrow, MD  propranolol (INDERAL) 60 MG tablet Take 60 mg by mouth 3 (three) times daily.    [provider]  zinc sulfate 220 (50 Zn) MG capsule Take 1 capsule (220 mg total) by mouth daily. 02/18/21   Geradine Girt, DO      Allergies    Darvon [propoxyphene] and Penicillins    Review of Systems   Review of Systems  Constitutional:  Negative for chills and fever.  HENT:  Negative for ear pain and sore throat.   Eyes:  Negative for pain and visual disturbance.  Respiratory:  Negative for cough and shortness of breath.  Cardiovascular:  Negative for chest pain and palpitations.  Gastrointestinal:  Negative for abdominal pain and vomiting.  Genitourinary:  Negative for dysuria and hematuria.  Musculoskeletal:  Negative for arthralgias and back pain.  Skin:  Negative for color change and rash.  Neurological:  Negative for seizures and syncope.  All other systems reviewed and are negative.   Physical Exam Updated Vital Signs BP 102/71   Pulse 79   Temp 98.4 F (36.9 C) (Oral)   Resp 15   Ht '5\' 4"'$  (1.626 m)   Wt 45.4 kg   SpO2 99%   BMI 17.16 kg/m  Physical Exam Vitals and nursing note reviewed.  Constitutional:      General: She is not in acute distress.     Appearance: She is well-developed.  HENT:     Head: Normocephalic and atraumatic.  Eyes:     Conjunctiva/sclera: Conjunctivae normal.  Cardiovascular:     Rate and Rhythm: Normal rate and regular rhythm.     Heart sounds: No murmur heard. Pulmonary:     Effort: Pulmonary effort is normal. No respiratory distress.     Breath sounds: Normal breath sounds.  Abdominal:     Palpations: Abdomen is soft.     Tenderness: There is no abdominal tenderness.  Musculoskeletal:        General: No swelling.     Cervical back: Neck supple.     Right hip: Normal.     Left hip: Normal.     Right upper leg: Normal.     Left upper leg: Normal.     Right knee: Bony tenderness present.     Left knee: Normal.     Right lower leg: Normal.     Left lower leg: Normal.     Right ankle: Normal. Normal pulse.     Left ankle: Normal. Normal pulse.  Skin:    General: Skin is warm and dry.     Capillary Refill: Capillary refill takes less than 2 seconds.     Comments: Sacral wound VAC in place  Neurological:     Mental Status: She is alert and oriented to person, place, and time.     GCS: GCS eye subscore is 4. GCS verbal subscore is 5. GCS motor subscore is 6.     Sensory: Sensation is intact.     Motor: Motor function is intact.  Psychiatric:        Mood and Affect: Mood normal.     ED Results / Procedures / Treatments   Labs (all labs ordered are listed, but only abnormal results are displayed) Labs Reviewed - No data to display  EKG None  Radiology DG Knee Right Port  Result Date: 09/18/2021 CLINICAL DATA:  Fall EXAM: PORTABLE RIGHT KNEE - 4 VIEW COMPARISON:  None Available. FINDINGS: No evidence of fracture, dislocation, or joint effusion. No significant degenerative changes. Diffuse demineralization. Vascular calcifications. IMPRESSION: No acute osseous abnormality. Electronically Signed   By: Yetta Glassman M.D.   On: 09/18/2021 08:29    Procedures Procedures    Medications Ordered  in ED Medications - No data to display  ED Course/ Medical Decision Making/ A&P                           Medical Decision Making Amount and/or Complexity of Data Reviewed Radiology: ordered.   42:80 AM  66 year old female with past medical history of GERD, hypertension, hyperlipidemia, COPD, and neuromuscular disorder presenting  for right-sided knee pain after mechanical fall.  Patient is alert and oriented x3, no acute distress, afebrile, stable vital signs.  Physical exam demonstrates tenderness palpation of the right knee at the superior aspect of the patella.  No ligamentous laxity.  Leg neurovascularly intact.  No hip pain or any other bony tenderness.  Patient had no head trauma, no headache, no midline spinal tenderness.  Neurovascularly intact.  This time think is fair to withhold any further cranial or spinal imaging at this time.  X-ray demonstrates no fractures.  Patient states her pain is controlled at this time and does not need any medications.  Patient in no distress and overall condition improved here in the ED. Detailed discussions were had with the patient regarding current findings, and need for close f/u with orthopedic surgery or on call doctor. The patient has been instructed to return immediately if the symptoms worsen in any way for re-evaluation. Patient verbalized understanding and is in agreement with current care plan. All questions answered prior to discharge.         Final Clinical Impression(s) / ED Diagnoses Final diagnoses:  Acute pain of right knee  Fall, initial encounter    Rx / DC Orders ED Discharge Orders     None         Lianne Cure, DO 11/28[birth year]55 [phone]

## 2021-09-18 NOTE — ED Notes (Signed)
Pt assisted onto bedpan.

## 2021-09-18 NOTE — Discharge Instructions (Signed)
Rest your right knee, elevate, ice, compression with Ace wrap, and pain medications as needed.  Follow-up with orthopedics in the next 2 weeks if pain does not improve.  Local physician provided in discharge handout.

## 2021-09-18 NOTE — ED Triage Notes (Signed)
Pt BIBA from home. Pt fell transferring from wheelchair to toilet, injuring right knee. Sin tear to right hand between thumb and index finger.

## 2021-09-19 ENCOUNTER — Emergency Department (HOSPITAL_COMMUNITY): Payer: Medicare Other

## 2021-09-19 ENCOUNTER — Encounter (HOSPITAL_COMMUNITY): Payer: Self-pay

## 2021-09-19 ENCOUNTER — Other Ambulatory Visit: Payer: Self-pay

## 2021-09-19 ENCOUNTER — Inpatient Hospital Stay (HOSPITAL_COMMUNITY)
Admission: EM | Admit: 2021-09-19 | Discharge: 2021-09-22 | DRG: 871 | Disposition: A | Discharge status: Home Health | Payer: Medicare Other | Attending: Family Medicine | Admitting: Family Medicine

## 2021-09-19 DIAGNOSIS — I959 Hypotension, unspecified: Secondary | ICD-10-CM | POA: Diagnosis not present

## 2021-09-19 DIAGNOSIS — L89310 Pressure ulcer of right buttock, unstageable: Secondary | ICD-10-CM | POA: Diagnosis not present

## 2021-09-19 DIAGNOSIS — G35 Multiple sclerosis: Secondary | ICD-10-CM | POA: Diagnosis not present

## 2021-09-19 DIAGNOSIS — M869 Osteomyelitis, unspecified: Secondary | ICD-10-CM | POA: Diagnosis present

## 2021-09-19 DIAGNOSIS — R82998 Other abnormal findings in urine: Secondary | ICD-10-CM | POA: Diagnosis present

## 2021-09-19 DIAGNOSIS — E440 Moderate protein-calorie malnutrition: Secondary | ICD-10-CM | POA: Diagnosis present

## 2021-09-19 DIAGNOSIS — J439 Emphysema, unspecified: Secondary | ICD-10-CM | POA: Diagnosis present

## 2021-09-19 DIAGNOSIS — Z681 Body mass index (BMI) 19 or less, adult: Secondary | ICD-10-CM | POA: Diagnosis not present

## 2021-09-19 DIAGNOSIS — Z20822 Contact with and (suspected) exposure to covid-19: Secondary | ICD-10-CM | POA: Diagnosis present

## 2021-09-19 DIAGNOSIS — D500 Iron deficiency anemia secondary to blood loss (chronic): Secondary | ICD-10-CM | POA: Diagnosis present

## 2021-09-19 DIAGNOSIS — R5381 Other malaise: Secondary | ICD-10-CM | POA: Diagnosis present

## 2021-09-19 DIAGNOSIS — W1830XA Fall on same level, unspecified, initial encounter: Secondary | ICD-10-CM | POA: Diagnosis present

## 2021-09-19 DIAGNOSIS — F17210 Nicotine dependence, cigarettes, uncomplicated: Secondary | ICD-10-CM | POA: Diagnosis present

## 2021-09-19 DIAGNOSIS — L89154 Pressure ulcer of sacral region, stage 4: Secondary | ICD-10-CM | POA: Diagnosis present

## 2021-09-19 DIAGNOSIS — A419 Sepsis, unspecified organism: Secondary | ICD-10-CM | POA: Diagnosis not present

## 2021-09-19 DIAGNOSIS — Z833 Family history of diabetes mellitus: Secondary | ICD-10-CM | POA: Diagnosis not present

## 2021-09-19 DIAGNOSIS — Z841 Family history of disorders of kidney and ureter: Secondary | ICD-10-CM

## 2021-09-19 DIAGNOSIS — R627 Adult failure to thrive: Secondary | ICD-10-CM | POA: Diagnosis not present

## 2021-09-19 DIAGNOSIS — N390 Urinary tract infection, site not specified: Secondary | ICD-10-CM | POA: Diagnosis not present

## 2021-09-19 DIAGNOSIS — E785 Hyperlipidemia, unspecified: Secondary | ICD-10-CM | POA: Diagnosis not present

## 2021-09-19 DIAGNOSIS — G9341 Metabolic encephalopathy: Secondary | ICD-10-CM | POA: Diagnosis not present

## 2021-09-19 DIAGNOSIS — E876 Hypokalemia: Secondary | ICD-10-CM | POA: Diagnosis not present

## 2021-09-19 DIAGNOSIS — N3000 Acute cystitis without hematuria: Secondary | ICD-10-CM

## 2021-09-19 DIAGNOSIS — R296 Repeated falls: Secondary | ICD-10-CM | POA: Diagnosis present

## 2021-09-19 DIAGNOSIS — Z888 Allergy status to other drugs, medicaments and biological substances status: Secondary | ICD-10-CM

## 2021-09-19 DIAGNOSIS — I10 Essential (primary) hypertension: Secondary | ICD-10-CM | POA: Diagnosis present

## 2021-09-19 DIAGNOSIS — Z7401 Bed confinement status: Secondary | ICD-10-CM | POA: Diagnosis not present

## 2021-09-19 DIAGNOSIS — M1909 Primary osteoarthritis, other specified site: Secondary | ICD-10-CM | POA: Diagnosis present

## 2021-09-19 DIAGNOSIS — M8608 Acute hematogenous osteomyelitis, other sites: Secondary | ICD-10-CM | POA: Diagnosis not present

## 2021-09-19 DIAGNOSIS — J449 Chronic obstructive pulmonary disease, unspecified: Secondary | ICD-10-CM | POA: Diagnosis not present

## 2021-09-19 DIAGNOSIS — K219 Gastro-esophageal reflux disease without esophagitis: Secondary | ICD-10-CM | POA: Diagnosis present

## 2021-09-19 DIAGNOSIS — Z9181 History of falling: Secondary | ICD-10-CM

## 2021-09-19 DIAGNOSIS — M4628 Osteomyelitis of vertebra, sacral and sacrococcygeal region: Secondary | ICD-10-CM | POA: Diagnosis not present

## 2021-09-19 DIAGNOSIS — Z8249 Family history of ischemic heart disease and other diseases of the circulatory system: Secondary | ICD-10-CM | POA: Diagnosis not present

## 2021-09-19 DIAGNOSIS — Z83438 Family history of other disorder of lipoprotein metabolism and other lipidemia: Secondary | ICD-10-CM | POA: Diagnosis not present

## 2021-09-19 DIAGNOSIS — Z880 Allergy status to penicillin: Secondary | ICD-10-CM

## 2021-09-19 DIAGNOSIS — Z79899 Other long term (current) drug therapy: Secondary | ICD-10-CM

## 2021-09-19 DIAGNOSIS — Z801 Family history of malignant neoplasm of trachea, bronchus and lung: Secondary | ICD-10-CM | POA: Diagnosis not present

## 2021-09-19 LAB — COMPREHENSIVE METABOLIC PANEL
ALT: 8 U/L (ref 0–44)
AST: 15 U/L (ref 15–41)
Albumin: 3 g/dL — ABNORMAL LOW (ref 3.5–5.0)
Alkaline Phosphatase: 69 U/L (ref 38–126)
Anion gap: 9 (ref 5–15)
BUN: 20 mg/dL (ref 8–23)
CO2: 26 mmol/L (ref 22–32)
Calcium: 8.4 mg/dL — ABNORMAL LOW (ref 8.9–10.3)
Chloride: 103 mmol/L (ref 98–111)
Creatinine, Ser: 0.64 mg/dL (ref 0.44–1.00)
GFR, Estimated: >60 mL/min (ref >60)
Glucose, Bld: 129 mg/dL — ABNORMAL HIGH (ref 70–99)
Potassium: 3.8 mmol/L (ref 3.5–5.1)
Sodium: 138 mmol/L (ref 135–145)
Total Bilirubin: 0.3 mg/dL (ref 0.3–1.2)
Total Protein: 6.9 g/dL (ref 6.5–8.1)

## 2021-09-19 LAB — CBC WITH DIFFERENTIAL/PLATELET
Abs Immature Granulocytes: 0.06 10*3/uL (ref 0.00–0.07)
Basophils Absolute: 0 10*3/uL (ref 0.0–0.1)
Basophils Relative: 0 %
Eosinophils Absolute: 0.3 10*3/uL (ref 0.0–0.5)
Eosinophils Relative: 3 %
HCT: 32 % — ABNORMAL LOW (ref 36.0–46.0)
Hemoglobin: 10.3 g/dL — ABNORMAL LOW (ref 12.0–15.0)
Immature Granulocytes: 1 %
Lymphocytes Relative: 4 %
Lymphs Abs: 0.6 10*3/uL — ABNORMAL LOW (ref 0.7–4.0)
MCH: 28 pg (ref 26.0–34.0)
MCHC: 32.2 g/dL (ref 30.0–36.0)
MCV: 87 fL (ref 80.0–100.0)
Monocytes Absolute: 1.3 10*3/uL — ABNORMAL HIGH (ref 0.1–1.0)
Monocytes Relative: 10 %
Neutro Abs: 10.5 10*3/uL — ABNORMAL HIGH (ref 1.7–7.7)
Neutrophils Relative %: 82 %
Platelets: 295 10*3/uL (ref 150–400)
RBC: 3.68 MIL/uL — ABNORMAL LOW (ref 3.87–5.11)
RDW: 16.5 % — ABNORMAL HIGH (ref 11.5–15.5)
WBC: 12.8 10*3/uL — ABNORMAL HIGH (ref 4.0–10.5)
nRBC: 0 % (ref 0.0–0.2)

## 2021-09-19 LAB — PROTIME-INR
INR: 1.1 (ref 0.8–1.2)
Prothrombin Time: 14.2 seconds (ref 11.4–15.2)

## 2021-09-19 LAB — URINALYSIS, ROUTINE W REFLEX MICROSCOPIC
Bilirubin Urine: NEGATIVE
Glucose, UA: NEGATIVE mg/dL
Hgb urine dipstick: NEGATIVE
Ketones, ur: 5 mg/dL — AB
Leukocytes,Ua: NEGATIVE
Nitrite: NEGATIVE
Protein, ur: 100 mg/dL — AB
Specific Gravity, Urine: 1.017 (ref 1.005–1.030)
pH: 6 (ref 5.0–8.0)

## 2021-09-19 LAB — RESP PANEL BY RT-PCR (FLU A&B, COVID) ARPGX2
Influenza A by PCR: NEGATIVE
Influenza B by PCR: NEGATIVE
SARS Coronavirus 2 by RT PCR: NEGATIVE

## 2021-09-19 LAB — APTT: aPTT: 35 seconds (ref 24–36)

## 2021-09-19 LAB — LACTIC ACID, PLASMA
Lactic Acid, Venous: 1.6 mmol/L (ref 0.5–1.9)
Lactic Acid, Venous: 1.3 mmol/L (ref 0.5–1.9)

## 2021-09-19 MED ORDER — HYDROCODONE-ACETAMINOPHEN 5-325 MG PO TABS
1.0000 | ORAL_TABLET | Freq: Four times a day (QID) | ORAL | Status: DC | PRN
  Administered 2021-09-19 – 2021-09-22 (×7): 1 via ORAL
  Filled 2021-09-19 (×7): qty 1

## 2021-09-19 MED ORDER — HEPARIN SODIUM (PORCINE) 5000 UNIT/ML IJ SOLN
5000.0000 [IU] | Freq: Three times a day (TID) | INTRAMUSCULAR | Status: DC
  Administered 2021-09-19 – 2021-09-20 (×4): 5000 [IU] via SUBCUTANEOUS
  Filled 2021-09-19 (×4): qty 1

## 2021-09-19 MED ORDER — ALBUMIN HUMAN 25 % IV SOLN
25.0000 g | Freq: Four times a day (QID) | INTRAVENOUS | Status: AC
  Administered 2021-09-19 – 2021-09-20 (×3): 25 g via INTRAVENOUS
  Filled 2021-09-19 (×3): qty 100

## 2021-09-19 MED ORDER — VANCOMYCIN HCL IN DEXTROSE 1-5 GM/200ML-% IV SOLN
1000.0000 mg | Freq: Once | INTRAVENOUS | Status: AC
  Administered 2021-09-19: 1000 mg via INTRAVENOUS
  Filled 2021-09-19: qty 200

## 2021-09-19 MED ORDER — ALBUMIN HUMAN 25 % IV SOLN
25.0000 g | Freq: Once | INTRAVENOUS | Status: AC
  Administered 2021-09-19: 25 g via INTRAVENOUS
  Filled 2021-09-19 (×2): qty 100

## 2021-09-19 MED ORDER — LACTATED RINGERS IV SOLN: INTRAVENOUS | Status: DC

## 2021-09-19 MED ORDER — GABAPENTIN 300 MG PO CAPS
600.0000 mg | ORAL_CAPSULE | Freq: Three times a day (TID) | ORAL | Status: DC
  Administered 2021-09-19 – 2021-09-22 (×10): 600 mg via ORAL
  Filled 2021-09-19 (×10): qty 2

## 2021-09-19 MED ORDER — POLYETHYLENE GLYCOL 3350 17 G PO PACK
17.0000 g | PACK | Freq: Every day | ORAL | Status: DC
  Administered 2021-09-19 – 2021-09-22 (×3): 17 g via ORAL
  Filled 2021-09-19 (×3): qty 1

## 2021-09-19 MED ORDER — LACTATED RINGERS IV BOLUS
1000.0000 mL | Freq: Once | INTRAVENOUS | Status: AC
  Administered 2021-09-19: 1000 mL via INTRAVENOUS

## 2021-09-19 MED ORDER — ZINC SULFATE 220 (50 ZN) MG PO CAPS
220.0000 mg | ORAL_CAPSULE | Freq: Every day | ORAL | Status: DC
  Administered 2021-09-19 – 2021-09-22 (×4): 220 mg via ORAL
  Filled 2021-09-19 (×4): qty 1

## 2021-09-19 MED ORDER — PROPRANOLOL HCL 10 MG PO TABS: 60.0000 mg | ORAL_TABLET | Freq: Three times a day (TID) | ORAL | Status: DC

## 2021-09-19 MED ORDER — LACTATED RINGERS IV BOLUS
250.0000 mL | Freq: Once | INTRAVENOUS | Status: AC
  Administered 2021-09-19: 250 mL via INTRAVENOUS

## 2021-09-19 MED ORDER — SODIUM CHLORIDE 0.9 % IV SOLN
2.0000 g | Freq: Two times a day (BID) | INTRAVENOUS | Status: DC
  Administered 2021-09-19 – 2021-09-21 (×6): 2 g via INTRAVENOUS
  Filled 2021-09-19 (×6): qty 12.5

## 2021-09-19 MED ORDER — ALBUTEROL SULFATE (2.5 MG/3ML) 0.083% IN NEBU: 2.5000 mg | INHALATION_SOLUTION | RESPIRATORY_TRACT | Status: DC | PRN

## 2021-09-19 MED ORDER — DIAZEPAM 5 MG PO TABS: 5.0000 mg | ORAL_TABLET | ORAL | Status: DC

## 2021-09-19 MED ORDER — BACLOFEN 10 MG PO TABS
10.0000 mg | ORAL_TABLET | Freq: Three times a day (TID) | ORAL | Status: DC
  Administered 2021-09-19 – 2021-09-22 (×10): 10 mg via ORAL
  Filled 2021-09-19 (×10): qty 1

## 2021-09-19 MED ORDER — ATORVASTATIN CALCIUM 20 MG PO TABS
20.0000 mg | ORAL_TABLET | Freq: Every day | ORAL | Status: DC
  Administered 2021-09-19 – 2021-09-22 (×4): 20 mg via ORAL
  Filled 2021-09-19 (×4): qty 1

## 2021-09-19 MED ORDER — SODIUM CHLORIDE 0.9 % IV SOLN
2.0000 g | Freq: Once | INTRAVENOUS | Status: AC
  Administered 2021-09-19: 2 g via INTRAVENOUS
  Filled 2021-09-19: qty 20

## 2021-09-19 MED ORDER — ASCORBIC ACID 500 MG PO TABS
500.0000 mg | ORAL_TABLET | Freq: Every day | ORAL | Status: DC
  Administered 2021-09-19 – 2021-09-22 (×4): 500 mg via ORAL
  Filled 2021-09-19 (×4): qty 1

## 2021-09-19 MED ORDER — VANCOMYCIN HCL 750 MG/150ML IV SOLN
750.0000 mg | INTRAVENOUS | Status: DC
  Administered 2021-09-20 – 2021-09-21 (×2): 750 mg via INTRAVENOUS
  Filled 2021-09-19 (×3): qty 150

## 2021-09-19 MED ORDER — ENSURE ENLIVE PO LIQD
237.0000 mL | Freq: Three times a day (TID) | ORAL | Status: DC
  Administered 2021-09-19 – 2021-09-22 (×10): 237 mL via ORAL
  Filled 2021-09-19 (×5): qty 237

## 2021-09-19 MED ORDER — DIAZEPAM 5 MG PO TABS
5.0000 mg | ORAL_TABLET | Freq: Four times a day (QID) | ORAL | Status: DC | PRN
Start: 2021-09-19 — End: 2021-09-22
  Administered 2021-09-21: 5 mg via ORAL
  Filled 2021-09-19: qty 1

## 2021-09-19 NOTE — Assessment & Plan Note (Addendum)
-   UA indicative of UTI -was on cefepime -Urine culture >>>> ESBL -Per infectious disease review recommendation this is a colonization and not a source of infection No further antibiotic needed for covering UTI

## 2021-09-19 NOTE — Assessment & Plan Note (Signed)
-   Albumin 3.0 -Continue feeding supplement -Continue to encourage nutrient dense food choices

## 2021-09-19 NOTE — Assessment & Plan Note (Addendum)
-  Resolved sepsis physiology  On admission met Sepsis criteria -secondary to osteomyelitis versus UTI 1. SIRS criteria temp 102.9, heart rate 93, respiratory rate 24, leukocytosis 12.8, with altered mental status 2. Most likely source of infection is osteomyelitis 3. Per surgery stage IV decubitus not source of sepsis 4. UTI-colonized ESBL per ID not source of sepsis 5. Life threatening organ dysfunction 2/2 infection is evidenced by: Altered mental status 6. Antibiotics was on  Rocephin and vancomycin, then cefepime and vancomycin >> to p.o. cefdinir and doxycycline 7. Fluids given prior to admission 2 L bolus 8. Continue LR at 125 mill per hour>> subsequently discontinued 9. Sepsis order set utilized

## 2021-09-19 NOTE — Assessment & Plan Note (Addendum)
-   stable on RA satting 100% -Current smoker -Counseled on the importance of cessation -no inhalers on home med list -albuterol PRN  -Continue to monitor

## 2021-09-19 NOTE — Progress Notes (Signed)
PROGRESS NOTE    Patient: Faith [name]                            PCP: Janifer Adie, MD                    DOB: 1955-06-18            DOA: 09/19/2021 OZD:[id number]             DOS: 09/19/2021, 10:42 AM   LOS: 0 days   Date of Service: The patient was seen and examined on 09/19/2021  Subjective:   The patient was seen and examined this morning. Stable at this time. Still complaining of :  Otherwise no issues overnight .  Brief Narrative:   Faith [name] is a 66 y.o. female with medical history significant of COPD, GERD, hypertension, hyperlipidemia, neuromuscular disorder- MS bedbound, presents  to ED with a chief complaint of sacral wound.   Patient reports wound started 1 year ago.  She came in today because she has been more fatigued and the pain has been worse.  She describes the pain in the wound as pressure and sharp.  It is worse with positional changes.  It is better when she takes her hydrocodone at home.  She denies fever.  She admits to yellow-green drainage from the wound.  Patient reports this drainage is malodorous.  No dysuria, does report malodorous urine.  She has had no hematuria.  Although patient visibly appears short of breath she reports no shortness of breath, chest pain, palpitations.  Patient reports no nausea vomiting or abdominal pain.  She does report she has had a decrease in appetite.  She is drinking plenty of water and Gatorade at home.  Patient has no other complaints at this time.  ED: Temp 98.4-102.9, respiratory rate 15-24, heart rate 76-93, blood pressure 94/61-108/74, satting at 92-100% Leukocytosis 12.8, hemoglobin 10.3, platelets 295 Chemistries unremarkable Albumin 3.0 UA is indicative of UTI Blood culture pending Urine culture pending CT C-spine is 3.4 mm anterior listhesis at C4 but stable, marked severity multi level degenerative changes CT head shows no acute intracranial abnormality CT pelvis shows mild slightly asymmetric urinary  bladder wall thickening and acute osteomyelitis -Follow-up MRI has been recommended on CT/has been determined to be nonurgent, so patient has been admitted to [facility]:   Principal Problem:   Sepsis ([place]) Active Problems:   Sacral decubitus ulcer, stage IV ([place])   Osteomyelitis ([place])   UTI (urinary tract infection)   COPD (chronic obstructive pulmonary disease) (HCC)   Essential hypertension   Protein-calorie malnutrition, moderate (HCC)   Debility     Assessment and Plan: * Sepsis ([place]) Blood pressure 108/64, pulse 88, temperature (!) 100.9 F (38.3 C), temperature source Rectal, resp. rate 20, height '5\' 4"'  (1.626 m), weight 45.4 kg, SpO2 100 %.   On admission met Sepsis criteria -secondary to osteomyelitis versus UTI SIRS criteria temp 102.9, heart rate 93, respiratory rate 24, leukocytosis 12.8, with altered mental status Most likely source of infection is osteomyelitis Life threatening organ dysfunction 2/2 infection is evidenced by: Altered mental status Antibiotics started initially started on Rocephin and vancomycin, will broaden to cefepime and vancomycin Fluids given prior to admission 2 L bolus Continue LR at 125 mill per hour Sepsis order set utilized  UTI (urinary tract infection) - UA indicative of UTI -Continue cefepime -Urine  culture >>>>   Osteomyelitis (HCC) - Osteomyelitis seen on CT scan of pelvis - We will Continue cefepime and vancomycin -Blood cultures >>> -Plan to narrow antibiotics based on cultures -We will likely have to be discharged on extended course of antibiotics -Continue to monitor  Sacral decubitus ulcer, stage IV (HCC) - Likely infected  -Associated osteomyelitis -Continue cefepime and vancomycin - Abx per sepsis  Protein-calorie malnutrition, moderate (HCC) - Albumin 3.0 -Continue feeding supplement -Continue to encourage nutrient dense food choices  Essential hypertension - BP soft, but stable  -  Cont. To Hold BB today   COPD (chronic obstructive pulmonary disease) (HCC) - stable on RA satting 100% -Current smoker -Counseled on the importance of cessation -no inhalers on home med list -albuterol PRN  -Continue to monitor  Debility - Severe debility, bedbound due to progressive MS -Continue monitoring closely, patient recently received a hospital bed at home -Recommend every 2 hours position change -PT OT and fall precautions            ----------------------------------------------------------------------------------------------------------------------------------------------- Nutritional status:  The patient's BMI is: Body mass index is 17.17 kg/m. I agree with the assessment and plan as outlined below: Nutrition Status:           Skin Assessment: I have examined the patient's skin and I agree with the wound assessment as performed by wound care team As outlined belowe: Pressure Injury 03/13/20 Buttocks Right Unstageable - Full thickness tissue loss in which the base of the injury is covered by slough (yellow, tan, gray, green or Sox) and/or eschar (tan, Hipp or black) in the wound bed. (Active)  03/13/20 1800  Location: Buttocks  Location Orientation: Right  Staging: Unstageable - Full thickness tissue loss in which the base of the injury is covered by slough (yellow, tan, gray, green or [place]amoto) and/or eschar (tan, [place] or black) in the wound bed.  Wound Description (Comments):   Present on Admission: Yes     Pressure Injury 02/15/21 Sacrum Medial Stage 4 - Full thickness tissue loss with exposed bone, tendon or muscle. 4cm x 4 cm x 2.5cm (Active)  02/15/21 0420  Location: Sacrum  Location Orientation: Medial  Staging: Stage 4 - Full thickness tissue loss with exposed bone, tendon or muscle.  Wound Description (Comments): 4cm x 4 cm x 2.5cm  Present on Admission: Yes      ---------------------------------------------------------------------------------------------------------------------------------------------------- Cultures; Blood Cultures x 2 >> NGT Urine Culture  >>> NGT  Wound culture >>         ------------------------------------------------------------------------------------------------------------------------------------------------  DVT prophylaxis:  heparin injection 5,000 Units Start: 09/19/21 0600 SCDs Start: 09/19/21 0523   Code Status:   Code Status: Full Code  Family Communication: No family member present at bedside- attempt will be made to update daily The above findings and plan of care has been discussed with patient (and family)  in detail,  they expressed understanding and agreement of above. -Advance care planning has been discussed.   Admission status:   Status is: Inpatient Remains inpatient appropriate because: Needing broad-spectrum IV antibiotics, IV fluids.  Surgical evaluation     Procedures:   No admission procedures for hospital encounter.   Antimicrobials:  Anti-infectives (From admission, onward)    Start     Dose/Rate Route Frequency Ordered Stop   09/20/21 0400  vancomycin (VANCOREADY) IVPB 750 mg/150 mL        750 mg 150 mL/hr over 60 Minutes Intravenous Every 24 hours 09/19/21 0554     09/19/21 0600  ceFEPIme (MAXIPIME)  2 g in sodium chloride 0.9 % 100 mL IVPB        2 g 200 mL/hr over 30 Minutes Intravenous Every 12 hours 09/19/21 0554     09/19/21 0415  vancomycin (VANCOCIN) IVPB 1000 mg/200 mL premix        1,000 mg 200 mL/hr over 60 Minutes Intravenous  Once 09/19/21 0403 09/19/21 0554   09/19/21 0145  cefTRIAXone (ROCEPHIN) 2 g in sodium chloride 0.9 % 100 mL IVPB        2 g 200 mL/hr over 30 Minutes Intravenous  Once 09/19/21 0143 09/19/21 0316        Medication:   ascorbic acid  500 mg Oral Daily   atorvastatin  20 mg Oral Daily   baclofen  10 mg Oral TID   diazepam   5 mg Oral See admin instructions   feeding supplement  237 mL Oral TID BM   gabapentin  600 mg Oral TID   heparin  5,000 Units Subcutaneous Q8H   polyethylene glycol  17 g Oral Daily   zinc sulfate  220 mg Oral Daily    albuterol, HYDROcodone-acetaminophen   Objective:   Vitals:   09/19/21 0841 09/19/21 0850 09/19/21 0853 09/19/21 0900  BP: (!) 93/58  97/60 95/60  Pulse: 88   86  Resp: '19  16 17  ' Temp:  99.5 F (37.5 C) 99.5 F (37.5 C) 99.5 F (37.5 C)  TempSrc:  Oral Oral Axillary  SpO2: 97%  97% 97%  Weight:      Height:        Intake/Output Summary (Last 24 hours) at 09/19/2021 1042 Last data filed at 09/19/2021 0900 Gross per 24 hour  Intake 2400 ml  Output 600 ml  Net 1800 ml   Filed Weights   09/19/21 0130  Weight: 45.4 kg     Examination:   Physical Exam  Constitution:  Alert, cooperative, no distress,  Appears calm and comfortable  Psychiatric:   Normal and stable mood and affect, cognition intact,   HEENT:        Normocephalic, PERRL, otherwise with in Normal limits  Chest:         Chest symmetric Cardio vascular:  S1/S2, RRR, No murmure, No Rubs or Gallops  pulmonary: Clear to auscultation bilaterally, respirations unlabored, negative wheezes / crackles Abdomen: Soft, non-tender, non-distended, bowel sounds,no masses, no organomegaly Muscular skeletal: Limited exam - in bed, able to move all 4 extremities,   Neuro: Severe lower extremity weakness, generalized weaknesses CNII-XII intact. , normal motor and sensation, reflexes intact  Extremities: No pitting edema lower extremities, +2 pulses  Skin: Dry, warm to touch, negative for any Rashes, deep stage IV sacral decubitus Wounds: per nursing documentation Pressure Injury 03/13/20 Buttocks Right Unstageable - Full thickness tissue loss in which the base of the injury is covered by slough (yellow, tan, gray, green or [place]rea) and/or eschar (tan, [place] or black) in the wound bed. (Active)  03/13/20 1800   Location: Buttocks  Location Orientation: Right  Staging: Unstageable - Full thickness tissue loss in which the base of the injury is covered by slough (yellow, tan, gray, green or [place]) and/or eschar (tan, Joye or black) in the wound bed.  Wound Description (Comments):   Present on Admission: Yes     Pressure Injury 02/15/21 Sacrum Medial Stage 4 - Full thickness tissue loss with exposed bone, tendon or muscle. 4cm x 4 cm x 2.5cm (Active)  02/15/21 0420  Location: Sacrum  Location  Orientation: Medial  Staging: Stage 4 - Full thickness tissue loss with exposed bone, tendon or muscle.  Wound Description (Comments): 4cm x 4 cm x 2.5cm  Present on Admission: Yes    ------------------------------------------------------------------------------------------------------------------------------------------    LABs:     Latest Ref Rng & Units 09/19/2021    1:35 AM 04/28/2021    2:32 PM 04/15/2021    1:07 AM  CBC  WBC 4.0 - 10.5 K/uL 12.8  9.9  12.4   Hemoglobin 12.0 - 15.0 g/dL 10.3  11.1  10.4   Hematocrit 36.0 - 46.0 % 32.0  34.0  32.9   Platelets 150 - 400 K/uL 295  328  321       Latest Ref Rng & Units 09/19/2021    1:35 AM 04/14/2021   12:33 AM 04/13/2021   12:38 AM  CMP  Glucose 70 - 99 mg/dL 129  170  149   BUN 8 - 23 mg/dL 20  34  22   Creatinine 0.44 - 1.00 mg/dL 0.64  0.72  0.61   Sodium 135 - 145 mmol/L 138  139  139   Potassium 3.5 - 5.1 mmol/L 3.8  3.8  3.7   Chloride 98 - 111 mmol/L 103  104  103   CO2 22 - 32 mmol/L '26  25  27   ' Calcium 8.9 - 10.3 mg/dL 8.4  8.9  9.0   Total Protein 6.5 - 8.1 g/dL 6.9     Total Bilirubin 0.3 - 1.2 mg/dL 0.3     Alkaline Phos 38 - 126 U/L 69     AST 15 - 41 U/L 15     ALT 0 - 44 U/L 8          Micro Results Recent Results (from the past 240 hour(s))  Culture, blood (Routine x 2)     Status: None (Preliminary result)   Collection Time: 09/19/21  1:35 AM   Specimen: BLOOD RIGHT FOREARM  Result Value Ref Range Status    Specimen Description   Final    BLOOD RIGHT FOREARM BOTTLES DRAWN AEROBIC AND ANAEROBIC   Special Requests   Final    Blood Culture adequate volume Performed at [facility], [address] [place], [place] [address]    Culture PENDING  Incomplete   Report Status PENDING  Incomplete  Resp Panel by RT-PCR (Flu A&B, Covid) Anterior Nasal Swab     Status: None   Collection Time: 09/19/21  1:35 AM   Specimen: Anterior Nasal Swab  Result Value Ref Range Status   SARS Coronavirus 2 by RT PCR NEGATIVE NEGATIVE Final    Comment: (NOTE) SARS-CoV-2 target nucleic acids are NOT DETECTED.  The SARS-CoV-2 RNA is generally detectable in upper respiratory specimens during the acute phase of infection. The lowest concentration of SARS-CoV-2 viral copies this assay can detect is 138 copies/mL. A negative result does not preclude SARS-Cov-2 infection and should not be used as the sole basis for treatment or other patient management decisions. A negative result may occur with  improper specimen collection/handling, submission of specimen other than nasopharyngeal swab, presence of viral mutation(s) within the areas targeted by this assay, and inadequate number of viral copies(<138 copies/mL). A negative result must be combined with clinical observations, patient history, and epidemiological information. The expected result is Negative.  Fact Sheet for Patients:  EntrepreneurPulse.com.au  Fact Sheet for Healthcare Providers:  IncredibleEmployment.be  This test is no t yet approved or cleared by the [place] FDA  and  has been authorized for detection and/or diagnosis of SARS-CoV-2 by FDA under an Emergency Use Authorization (EUA). This EUA will remain  in effect (meaning this test can be used) for the duration of the COVID-19 declaration under Section 564(b)(1) of the Act, 21 U.S.C.section 360bbb-3(b)(1), unless the authorization is terminated  or revoked  sooner.       Influenza A by PCR NEGATIVE NEGATIVE Final   Influenza B by PCR NEGATIVE NEGATIVE Final    Comment: (NOTE) The Xpert Xpress SARS-CoV-2/FLU/RSV plus assay is intended as an aid in the diagnosis of influenza from Nasopharyngeal swab specimens and should not be used as a sole basis for treatment. Nasal washings and aspirates are unacceptable for Xpert Xpress SARS-CoV-2/FLU/RSV testing.  Fact Sheet for Patients: EntrepreneurPulse.com.au  Fact Sheet for Healthcare Providers: IncredibleEmployment.be  This test is not yet approved or cleared by the [place] FDA and has been authorized for detection and/or diagnosis of SARS-CoV-2 by FDA under an Emergency Use Authorization (EUA). This EUA will remain in effect (meaning this test can be used) for the duration of the COVID-19 declaration under Section 564(b)(1) of the Act, 21 U.S.C. section 360bbb-3(b)(1), unless the authorization is terminated or revoked.  Performed at [facility], [address], [place], [place] [address]   Culture, blood (Routine x 2)     Status: None (Preliminary result)   Collection Time: 09/19/21  1:42 AM   Specimen: BLOOD RIGHT FOREARM  Result Value Ref Range Status   Specimen Description   Final    BLOOD RIGHT FOREARM BOTTLES DRAWN AEROBIC AND ANAEROBIC   Special Requests   Final    Blood Culture adequate volume Performed at [facility], [address], [place], [place] [address]    Culture PENDING  Incomplete   Report Status PENDING  Incomplete    Radiology Reports CT PELVIS WO CONTRAST  Result Date: 09/19/2021 CLINICAL DATA:  Status post fall. EXAM: CT PELVIS WITHOUT CONTRAST TECHNIQUE: Multidetector CT imaging of the pelvis was performed following the standard protocol without intravenous contrast. RADIATION DOSE REDUCTION: This exam was performed according to the departmental dose-optimization program which includes automated exposure control,  adjustment of the mA and/or kV according to patient size and/or use of iterative reconstruction technique. COMPARISON:  December 26, 2020 FINDINGS: Urinary Tract: There is mild urinary bladder wall thickening which is slightly more prominent along the anterolateral aspect of the bladder on the left. Bowel: There is no evidence of bowel dilatation. A large amount of stool is seen throughout the large bowel. Noninflamed diverticula are seen throughout the sigmoid colon. Vascular/Lymphatic: There is marked severity calcification of the visualized portion of the abdominal aorta and bilateral common iliac arteries, without evidence of aneurysmal dilatation. Reproductive: Uterine atrophy is suspected. The bilateral adnexa are unremarkable. Other:  None. Musculoskeletal: An ill-defined decubitus ulcer is seen along the posterior aspect of the lower sacrum. This represents a new finding when compared to the prior study. A mild amount of associated soft tissue air is seen which extends along the posterior cortex of the lower sacrum on the left (axial CT images 59 through 65, CT series 3). A mild amount of cortical destruction is seen. There is no evidence of an acute fracture or dislocation. Marked severity degenerative changes seen within the visualized portion of the lower lumbar spine. This is most prominent at the level of L4-L5. IMPRESSION: 1. Ill-defined sacral decubitus ulcer along the posterior aspect of the lower sacrum on the left, with additional findings  consistent with acute osteomyelitis. Further evaluation with MRI is recommended. 2. Sigmoid diverticulosis. 3. Mild, slightly asymmetric urinary bladder wall thickening. Correlation with urinalysis is recommended to exclude the presence of an underlying cystitis. 4. Aortic atherosclerosis. Aortic Atherosclerosis (ICD10-I70.0). Electronically Signed   By: Virgina Norfolk M.D.   On: 09/19/2021 04:30   CT CERVICAL SPINE WO CONTRAST  Result Date:  09/19/2021 CLINICAL DATA:  Altered mental status with recent fall. EXAM: CT CERVICAL SPINE WITHOUT CONTRAST TECHNIQUE: Multidetector CT imaging of the cervical spine was performed without intravenous contrast. Multiplanar CT image reconstructions were also generated. RADIATION DOSE REDUCTION: This exam was performed according to the departmental dose-optimization program which includes automated exposure control, adjustment of the mA and/or kV according to patient size and/or use of iterative reconstruction technique. COMPARISON:  Cervical spine CT, dated January 05, 2009 and cervical spine MR, dated March 01, 2020 FINDINGS: Alignment: There is mild reversal of the normal cervical spine lordosis. Stable, approximately 3.4 mm anterolisthesis of the C4 vertebral body is noted on C5 (sagittal reformatted image 32, CT series 5). Skull base and vertebrae: No acute fracture. A metallic density fusion plate and screws are seen along the anterior aspects of the C3 and C4 vertebral bodies. Bilateral metallic density pedicle screws are also seen at these levels, with prior posterior decompression seen at the levels of C3-C4 and C4-C5. Soft tissues and spinal canal: No prevertebral fluid or swelling. No visible canal hematoma. Disc levels: Marked severity endplate sclerosis and moderate severity anterior osteophyte formation are seen at the levels of C5-C6 and C6-C7. Mild to moderate severity endplate sclerosis is seen at the levels of C2-C3 and C4-C5. There is marked severity narrowing of the anterior atlantoaxial articulation, with marked severity intervertebral disc space narrowing seen at the levels of C4-C5, C5-C6 and C6-C7. Bilateral marked severity multilevel facet joint hypertrophy is noted. Upper chest: Negative. Other: None. IMPRESSION: 1. Stable, approximately 3.4 mm anterolisthesis of the C4 vertebral body on C5. 2. Marked severity multilevel degenerative changes, as described above, without evidence of an acute  cervical spine fracture. 3. Stable postoperative changes consistent with C3-C4 anterior fusion and posterior decompression at the levels of C3-C4 and C4-C5. Electronically Signed   By: Virgina Norfolk M.D.   On: 09/19/2021 04:16   CT HEAD WO CONTRAST (5MM)  Result Date: 09/19/2021 CLINICAL DATA:  Status post fall with altered mental status and fever. EXAM: CT HEAD WITHOUT CONTRAST TECHNIQUE: Contiguous axial images were obtained from the base of the skull through the vertex without intravenous contrast. RADIATION DOSE REDUCTION: This exam was performed according to the departmental dose-optimization program which includes automated exposure control, adjustment of the mA and/or kV according to patient size and/or use of iterative reconstruction technique. COMPARISON:  December 10, 2020 FINDINGS: Brain: There is mild cerebral atrophy with widening of the extra-axial spaces and ventricular dilatation. Stable areas of decreased attenuation are seen within the bilateral periventricular white matter. There is no evidence of an acute infarct, hemorrhage, hydrocephalus, extra-axial collection or mass lesion/mass effect. Vascular: No hyperdense vessel or unexpected calcification. Skull: Stable focal left frontal hyperostosis frontalis internus is seen. Sinuses/Orbits: No acute finding. Other: None. IMPRESSION: 1. No acute intracranial abnormality. 2. Stable areas of decreased attenuation within the bilateral periventricular white matter, consistent with the patient's history of multiple sclerosis. Electronically Signed   By: Virgina Norfolk M.D.   On: 09/19/2021 04:04   DG Chest 1 View  Result Date: 09/19/2021 CLINICAL DATA:  Altered mental status and  fever. EXAM: CHEST  1 VIEW COMPARISON:  April 11, 2021 FINDINGS: The heart size and mediastinal contours are within normal limits. There is marked severity calcification of the thoracic aorta. Both lungs are clear. Postoperative changes are seen within the  visualized portion of the cervical spine. No acute osseous abnormalities are identified. IMPRESSION: No active cardiopulmonary disease. Electronically Signed   By: Virgina Norfolk M.D.   On: 09/19/2021 02:19    SIGNED: Deatra James, MD, FHM. [facility],  Pager (please use [email or web address] to page/text) Please use Epic Secure Chat for non-urgent communication (7AM-7PM)  If 7PM-7AM, please contact night-coverage [email or web address], 09/19/2021, 10:42 AM

## 2021-09-19 NOTE — Progress Notes (Signed)
Pt being followed by [facility] for Sepsis protocol. 

## 2021-09-19 NOTE — ED Provider Notes (Signed)
[facility] Emergency Department Provider Note MRN:  [id number]  Arrival date & time: 09/19/21     Chief Complaint   Altered Mental Status   History of Present Illness   Faith [name] is a 66 y.o. year-old female with a history of neuromuscular disorder, COPD presenting to the ED with chief complaint of altered mental status.  Altered, coming by ambulance, reportedly febrile at home.  Had a fall earlier today and was seen in the emergency department.  Patient endorsing malaise, pain all over.  Review of Systems  I was unable to obtain a full/accurate HPI, PMH, or ROS due to the patient's altered mental status.  Patient's Health History    Past Medical History:  Diagnosis Date   Anemia    my whole life, Used to take iron   Arthritis    spine, lumbar   COPD (chronic obstructive pulmonary disease) ([place])    Emphysema of lung ([place])    GERD (gastroesophageal reflux disease) 04/12/2021   HTN (hypertension) 03/01/2020   Hyperlipidemia 11/28/2019   Narcolepsy    by history, has nightmares   Neuromuscular disorder ([place])    leg issues from spine    Past Surgical History:  Procedure Laterality Date   ANTERIOR CERVICAL DECOMP/DISCECTOMY FUSION N/A 01/19/2019   Procedure: ANTERIOR CERVICAL DECOMPRESSION/DISCECTOMY Cervical three - four;  Surgeon: Ashok Pall, MD;  Location: [place];  Service: Neurosurgery;  Laterality: N/A;   EYE SURGERY Right    ORIF FACIAL FRACTURE     POSTERIOR CERVICAL LAMINECTOMY N/A 01/19/2019   Procedure: POSTERIOR CERVICAL LAMINECTOMY Removal of Synovial Cyst and posterior cervical fusion;  Surgeon: Ashok Pall, MD;  Location: [place];  Service: Neurosurgery;  Laterality: N/A;   TEE WITHOUT CARDIOVERSION N/A 03/17/2020   Procedure: TRANSESOPHAGEAL ECHOCARDIOGRAM (TEE) WITH PROPOFOL;  Surgeon: Satira Sark, MD;  [facility];  Service: Cardiovascular;  Laterality: N/A;   TUBAL LIGATION     tubaligation      Family History   Problem Relation Age of Onset   Heart attack Mother    Kidney failure Mother    Diabetes Mother    Heart disease Mother    Hyperlipidemia Mother    Hypertension Mother    Heart attack Father 76   Hypertension Father    Hypertension Sister    Diabetes Sister    Other Brother        house fire   Hypertension Brother    Seizures Brother    Heart disease Brother        mitral valve   Arthritis Daughter        DJD, AS fibromyalgia   Polymyositis Daughter    Cancer Maternal Grandfather        lung   Heart disease Maternal Grandfather    Other Paternal Grandmother        house fire   Alcohol abuse Son     Social History   Socioeconomic History   Marital status: Single    Spouse name: Not on file   Number of children: 3   Years of education: 12   Highest education level: Not on file  Occupational History   Occupation: disabled    Comment: house painting  Tobacco Use   Smoking status: Every Day    Packs/day: 0.25    Years: 45.00    Total pack years: 11.25    Types: Cigarettes   Smokeless tobacco: Never   Tobacco comments:    cutting down  Vaping Use   Vaping Use: Never used  Substance and Sexual Activity   Alcohol use: Not Currently    Alcohol/week: 1.0 standard drink of alcohol    Types: 1 Cans of beer per week   Drug use: No   Sexual activity: Not Currently    Birth control/protection: Post-menopausal  Other Topics Concern   Not on file  Social History Narrative   Patient lives with her son. [name], age 37, frequently visits   Social Determinants of Health   Financial Resource Strain: Not on file  Food Insecurity: Not on file  Transportation Needs: Not on file  Physical Activity: Not on file  Stress: Not on file  Social Connections: Not on file  Intimate Partner Violence: Not on file     Physical Exam   Vitals:   09/19/21 0300 09/19/21 0330  BP: 96/69 107/65  Pulse: 90 91  Resp: (!) 22 20  Temp:    SpO2: 100% 92%    CONSTITUTIONAL:  Chronically ill-appearing, NAD NEURO/PSYCH: Somnolent, wakes to voice, oriented to name, paraplegic EYES:  eyes equal and reactive ENT/NECK:  no LAD, no JVD CARDIO: Regular rate, well-perfused, normal S1 and S2 PULM:  CTAB no wheezing or rhonchi GI/GU:  non-distended, non-tender; large sacral wound MSK/SPINE:  No gross deformities, no edema SKIN:  no rash, atraumatic   *Additional and/or pertinent findings included in MDM below  Diagnostic and Interventional Summary    EKG Interpretation  Date/Time:  Saturday September 19 2021 01:47:15 EDT Ventricular Rate:  92 PR Interval:  161 QRS Duration: 89 QT Interval:  351 QTC Calculation: 435 R Axis:   65 Text Interpretation: Sinus rhythm RSR' in V1 or V2, probably normal variant Borderline repolarization abnormality Confirmed by Gerlene Fee [phone]) on 09/19/2021 1:58:04 AM       Labs Reviewed  COMPREHENSIVE METABOLIC PANEL - Abnormal; Notable for the following components:      Result Value   Glucose, Bld 129 (*)    Calcium 8.4 (*)    Albumin 3.0 (*)    All other components within normal limits  CBC WITH DIFFERENTIAL/PLATELET - Abnormal; Notable for the following components:   WBC 12.8 (*)    RBC 3.68 (*)    Hemoglobin 10.3 (*)    HCT 32.0 (*)    RDW 16.5 (*)    Neutro Abs 10.5 (*)    Lymphs Abs 0.6 (*)    Monocytes Absolute 1.3 (*)    All other components within normal limits  URINALYSIS, ROUTINE W REFLEX MICROSCOPIC - Abnormal; Notable for the following components:   APPearance CLOUDY (*)    Ketones, ur 5 (*)    Protein, ur 100 (*)    Bacteria, UA RARE (*)    All other components within normal limits  CULTURE, BLOOD (ROUTINE X 2)  CULTURE, BLOOD (ROUTINE X 2)  RESP PANEL BY RT-PCR (FLU A&B, COVID) ARPGX2  URINE CULTURE  LACTIC ACID, PLASMA  LACTIC ACID, PLASMA  PROTIME-INR  APTT    CT HEAD WO CONTRAST (5MM)  Final Result    CT CERVICAL SPINE WO CONTRAST  Final Result    CT PELVIS WO CONTRAST  Final Result     DG Chest 1 View  Final Result      Medications  lactated ringers infusion (has no administration in time range)  vancomycin (VANCOCIN) IVPB 1000 mg/200 mL premix (has no administration in time range)  lactated ringers bolus 1,000 mL (1,000 mLs Intravenous New Bag/Given 09/19/21 0156)  lactated ringers bolus 1,000 mL (1,000 mLs Intravenous New Bag/Given 09/19/21 0157)  cefTRIAXone (ROCEPHIN) 2 g in sodium chloride 0.9 % 100 mL IVPB (0 g Intravenous Stopped 09/19/21 0316)     Procedures  /  Critical Care .Critical Care  Performed by: Maudie Flakes, MD Authorized by: Maudie Flakes, MD   Critical care provider statement:    Critical care time (minutes):  45   Critical care was necessary to treat or prevent imminent or life-threatening deterioration of the following conditions:  Sepsis   Critical care was time spent personally by me on the following activities:  Development of treatment plan with patient or surrogate, discussions with consultants, evaluation of patient's response to treatment, examination of patient, ordering and review of laboratory studies, ordering and review of radiographic studies, ordering and performing treatments and interventions, pulse oximetry, re-evaluation of patient's condition and review of old charts   ED Course and Medical Decision Making  Initial Impression and Ddx Concern for sepsis, unclear source, history of sacral osteomyelitis and so this is considered again.  The wound does not appear terribly infected.  UTI, pneumonia also considered.  Past medical/surgical history that increases complexity of ED encounter: Paraplegia  Interpretation of Diagnostics I personally reviewed the EKG and my interpretation is as follows: Sinus rhythm  Labs overall reassuring with no significant blood count or electrolyte disturbance.  CT with concern for sacral osteomyelitis  Patient Reassessment and Ultimate Disposition/Management     We will admit to medicine.   Received 2 L LR, Vanco, ceftriaxone, now on LR infusion.  Patient management required discussion with the following services or consulting groups:  Hospitalist Service  Complexity of Problems Addressed Acute illness or injury that poses threat of life of bodily function  Additional Data Reviewed and Analyzed Further history obtained from: EMS on arrival  Additional Factors Impacting ED Encounter Risk Consideration of hospitalization  Barth Kirks. [place] Small, [place] [email or web address]  Final Clinical Impressions(s) / ED Diagnoses     ICD-10-CM   1. Sepsis, due to unspecified organism, unspecified whether acute organ dysfunction present ([place])  A41.9     2. Sacral osteomyelitis ([place])  M46.28       ED Discharge Orders     None        Discharge Instructions Discussed with and Provided to Patient:   Discharge Instructions   None      Maudie Flakes, MD 09/19/21 [phone]

## 2021-09-19 NOTE — H&P (Signed)
History and Physical    Patient: Faith [name] LPF:[id number] DOB: 06-Jul-1955 DOA: 09/19/2021 DOS: the patient was seen and examined on 09/19/2021 PCP: Janifer Adie, MD  Patient coming from: Home  Chief Complaint:  Chief Complaint  Patient presents with   Altered Mental Status   HPI: Faith [name] is a 66 y.o. female with medical history significant of COPD, GERD, hypertension, hyperlipidemia, neuromuscular disorder, and more presents ED with a chief complaint of sacral wound.  Patient reports wound started 1 year ago.  She came in today because she has been more fatigued and the pain has been worse.  She describes the pain in the wound as pressure and sharp.  It is worse with positional changes.  It is better when she takes her hydrocodone at home.  She denies fever.  She admits to yellow-green drainage from the wound.  Patient reports this drainage is malodorous.  Orts no dysuria, does report malodorous urine.  She has had no hematuria.  Although patient visibly appears short of breath she reports no shortness of breath, chest pain, palpitations.  Patient reports no nausea vomiting or abdominal pain.  She does report she has had a decrease in appetite.  She is drinking plenty of water and Gatorade at home.  Patient has no other complaints at this time.  Patient is a current smoker, she does not drink alcohol, does not use illicit drugs.  She is vaccinated for COVID.  Patient is full code. Review of Systems: As mentioned in the history of present illness. All other systems reviewed and are negative. Past Medical History:  Diagnosis Date   Anemia    my whole life, Used to take iron   Arthritis    spine, lumbar   COPD (chronic obstructive pulmonary disease) ([facility])    Emphysema of lung ([place])    GERD (gastroesophageal reflux disease) 04/12/2021   HTN (hypertension) 03/01/2020   Hyperlipidemia 11/28/2019   Narcolepsy    by history, has nightmares   Neuromuscular disorder ([place])    leg issues  from spine   Past Surgical History:  Procedure Laterality Date   ANTERIOR CERVICAL DECOMP/DISCECTOMY FUSION N/A 01/19/2019   Procedure: ANTERIOR CERVICAL DECOMPRESSION/DISCECTOMY Cervical three - four;  Surgeon: Ashok Pall, MD;  Location: [place];  Service: Neurosurgery;  Laterality: N/A;   EYE SURGERY Right    ORIF FACIAL FRACTURE     POSTERIOR CERVICAL LAMINECTOMY N/A 01/19/2019   Procedure: POSTERIOR CERVICAL LAMINECTOMY Removal of Synovial Cyst and posterior cervical fusion;  Surgeon: Ashok Pall, MD;  Location: [place];  Service: Neurosurgery;  Laterality: N/A;   TEE WITHOUT CARDIOVERSION N/A 03/17/2020   Procedure: TRANSESOPHAGEAL ECHOCARDIOGRAM (TEE) WITH PROPOFOL;  Surgeon: Satira Sark, MD;  [facility];  Service: Cardiovascular;  Laterality: N/A;   TUBAL LIGATION     tubaligation     Social History:  reports that she has been smoking cigarettes. She has a 11.25 pack-year smoking history. She has never used smokeless tobacco. She reports that she does not currently use alcohol after a past usage of about 1.0 standard drink of alcohol per week. She reports that she does not use drugs.  Allergies  Allergen Reactions   Darvon [Propoxyphene] Other (See Comments)    Hallicuations   Penicillins Rash    Did it involve swelling of the face/tongue/throat, SOB, or low BP? Unknown Did it involve sudden or severe rash/hives, skin peeling, or any reaction on the inside of your mouth or nose? Unknown  Did you need to seek medical attention at a hospital or doctor's office? Unknown When did it last happen?      Unknown If all above answers are "NO", may proceed with cephalosporin use.     Family History  Problem Relation Age of Onset   Heart attack Mother    Kidney failure Mother    Diabetes Mother    Heart disease Mother    Hyperlipidemia Mother    Hypertension Mother    Heart attack Father 38   Hypertension Father    Hypertension Sister    Diabetes Sister     Other Brother        house fire   Hypertension Brother    Seizures Brother    Heart disease Brother        mitral valve   Arthritis Daughter        DJD, AS fibromyalgia   Polymyositis Daughter    Cancer Maternal Grandfather        lung   Heart disease Maternal Grandfather    Other Paternal Grandmother        house fire   Alcohol abuse Son     Prior to Admission medications   Medication Sig Start Date End Date Taking? Authorizing Provider  ascorbic acid (VITAMIN C) 500 MG tablet Take 1 tablet (500 mg total) by mouth daily. 02/18/21   Geradine Girt, DO  atorvastatin (LIPITOR) 20 MG tablet Take 20 mg by mouth daily.    [provider]  baclofen (LIORESAL) 10 MG tablet Take 1 tablet (10 mg total) by mouth 3 (three) times daily. 02/17/21   Geradine Girt, DO  celecoxib (CELEBREX) 200 MG capsule Take 1 capsule (200 mg total) by mouth 2 (two) times daily as needed. 04/15/21   Shelly Coss, MD  Cholecalciferol (VITAMIN D3) 50 MCG (2000 UT) capsule Take 2,000 Units by mouth daily. 02/11/21   [provider]  Cladribine, 4 Tabs, (MAVENCLAD, 4 TABS,) 10 MG TBPK Take 10 mg by mouth as directed. Take 1 tablet PO each day for 4 days. Repeat cycle one month later.    Sater, Nanine Means, MD  diazepam (VALIUM) 5 MG tablet Take 1 tablet (5 mg total) by mouth See admin instructions. TAKE (1) TABLET BY MOUTH EVERY SIX HOURS AS NEEDED FOR MUSCLE SPASMS. 02/17/21   Geradine Girt, DO  feeding supplement (ENSURE ENLIVE / ENSURE PLUS) LIQD Take 237 mLs by mouth 3 (three) times daily between meals. 12/12/20   Alma Friendly, MD  gabapentin (NEURONTIN) 300 MG capsule Take 2 capsules (600 mg total) by mouth 3 (three) times daily. 02/05/19   Angiulli, Lavon Paganini, PA-C  HYDROcodone-acetaminophen (NORCO/VICODIN) 5-325 MG tablet Take 1 tablet by mouth every 6 (six) hours as needed for moderate pain. 02/17/21   Geradine Girt, DO  polyethylene glycol (MIRALAX) 17 g packet Take 17 g by mouth  daily. 12/25/20   Fredia Sorrow, MD  propranolol (INDERAL) 60 MG tablet Take 60 mg by mouth 3 (three) times daily.    [provider]  zinc sulfate 220 (50 Zn) MG capsule Take 1 capsule (220 mg total) by mouth daily. 02/18/21   Geradine Girt, DO    Physical Exam: Vitals:   09/19/21 0200 09/19/21 0300 09/19/21 0330 09/19/21 0500  BP: 107/66 96/69 107/65 108/64  Pulse: 93 90 91 88  Resp: (!) 24 (!) '22 20 20  '$ Temp:    (!) 100.9 F (38.3 C)  TempSrc:  Rectal  SpO2: 94% 100% 92% 100%  Weight:      Height:       1.  General: Patient lying supine in bed,  no acute distress   2. Psychiatric: Alert and oriented x 3, mood and behavior normal for situation, pleasant and cooperative with exam   3. Neurologic: Speech and language are normal, face is symmetric, moves all 4 extremities voluntarily, at baseline without acute deficits on limited exam   4. HEENMT:  Head is atraumatic, normocephalic, pupils reactive to light, neck is supple, trachea is midline, mucous membranes are moist   5. Respiratory : Lungs are diminished in the lower lung fields bilaterally but otherwise without wheezing, rhonchi, rales, no cyanosis, no increase in work of breathing or accessory muscle use   6. Cardiovascular : Heart rate normal, rhythm is regular, no murmurs, rubs or gallops, no peripheral edema, peripheral pulses palpated   7. Gastrointestinal:  Abdomen is soft, nondistended, nontender to palpation bowel sounds active, no masses or organomegaly palpated   8. Skin:  Cyst size decubitus ulcer over the sacral region with clean edges, no purulent drainage, surrounding erythema   9.Musculoskeletal:  No acute deformities or trauma, no asymmetry in tone, no peripheral edema, peripheral pulses palpated, no tenderness to palpation in the extremities  Data Reviewed: In the ED Temp 98.4-102.9, respiratory rate 15-24, heart rate 76-93, blood pressure 94/61-108/74, satting at  92-100% Leukocytosis 12.8, hemoglobin 10.3, platelets 295 Chemistries unremarkable Albumin 3.0 UA is indicative of UTI Blood culture pending Urine culture pending CT C-spine is 3.4 mm anterior listhesis at C4 but stable, marked severity multi level degenerative changes CT head shows no acute intracranial abnormality CT pelvis shows mild slightly asymmetric urinary bladder wall thickening and acute osteomyelitis -Follow-up MRI has been recommended on CT/has been determined to be nonurgent, so patient has been admitted to [place] and Plan: * Osteomyelitis ([place]) - Osteomyelitis seen on CT scan of pelvis -Continue cefepime and vancomycin -Blood cultures pending -Plan to narrow antibiotics based on cultures -We will likely have to be discharged on extended course of antibiotics -Continue to monitor  UTI (urinary tract infection) - UA indicative of UTI -Patient reports malodorous urine -Continue cefepime -Urine culture pending  Sepsis ([place]) Sepsis secondary to osteomyelitis versus UTI SIRS criteria temp 102.9, heart rate 93, respiratory rate 24, leukocytosis 12.8, with altered mental status Most likely source of infection is osteomyelitis Life threatening organ dysfunction 2/2 infection is evidenced by: Altered mental status Antibiotics started initially started on Rocephin and vancomycin, will broaden to cefepime and vancomycin Fluids given prior to admission 2 L bolus Continue LR at 125 mill per hour Sepsis order set utilized  Protein-calorie malnutrition, moderate ([facility]) - Albumin 3.0 -Continue feeding supplement -Continue to encourage nutrient dense food choices  Sacral decubitus ulcer, stage IV ([facility]) - Likely infected  -Associated osteomyelitis -Continue cefepime and vancomycin -See plan for sepsis  Essential hypertension Hold beta-blocker in the setting of soft BP  COPD (chronic obstructive pulmonary disease) (H[facility]) -Current smoker -Counseled on the  importance of cessation -no inhalers on home med list -albuterol PRN for dyspnea/wheezing -Continue to monitor      Advance Care Planning:   Code Status: Full Code   Consults:   Family Communication: No family at bedside  Severity of Illness: The appropriate patient status for this patient is INPATIENT. Inpatient status is judged to be reasonable and necessary in order to provide the required intensity of service to ensure the patient's safety.  The patient's presenting symptoms, physical exam findings, and initial radiographic and laboratory data in the context of their chronic comorbidities is felt to place them at high risk for further clinical deterioration. Furthermore, it is not anticipated that the patient will be medically stable for discharge from the hospital within 2 midnights of admission.   * I certify that at the point of admission it is my clinical judgment that the patient will require inpatient hospital care spanning beyond 2 midnights from the point of admission due to high intensity of service, high risk for further deterioration and high frequency of surveillance required.*  Author: Rolla Plate, DO 09/19/2021 6:44 AM  For on call review [email or web address].

## 2021-09-19 NOTE — ED Notes (Signed)
Gave pt sandwich and chips.Per pts request and RN approved

## 2021-09-19 NOTE — Progress Notes (Signed)
Pharmacy Antibiotic Note  Faith [name] is a 66 y.o. female admitted on 09/19/2021 with  osteomyelitis .  Pharmacy has been consulted for vancomycin and cefepime dosing.  Plan: Vancomycin '1000mg'$  x1 then '750mg'$  IV Q24H. Goal AUC 400-550.  Expected AUC 500.  SCr used 0.8 (actual 0.64). Cefepime 2g IV Q12H.  Height: '5\' 4"'$  (162.6 cm) Weight: 45.4 kg (100 lb) IBW/kg (Calculated) : 54.7  Temp (24hrs), Avg:100.7 F (38.2 C), Min:98.4 F (36.9 C), Max:102.9 F (39.4 C)  Recent Labs  Lab 09/19/21 0135 09/19/21 0404  WBC 12.8*  --   CREATININE 0.64  --   LATICACIDVEN 1.6 1.3    Estimated Creatinine Clearance: 50.2 mL/min (by C-G formula based on SCr of 0.64 mg/dL).    Allergies  Allergen Reactions   Darvon [Propoxyphene] Other (See Comments)    Hallicuations   Penicillins Rash    Did it involve swelling of the face/tongue/throat, SOB, or low BP? Unknown Did it involve sudden or severe rash/hives, skin peeling, or any reaction on the inside of your mouth or nose? Unknown Did you need to seek medical attention at a hospital or doctor's office? Unknown When did it last happen?      Unknown If all above answers are "NO", may proceed with cephalosporin use.     Thank you for allowing pharmacy to be a part of this patient's care.  Wynona Neat, PharmD, BCPS  09/19/2021 5:49 AM

## 2021-09-19 NOTE — Assessment & Plan Note (Addendum)
-   Osteomyelitis seen on CT scan of pelvis - We will Continue cefepime and vancomycin -Blood cultures >>> -Plan to narrow antibiotics based on cultures -We will likely have to be discharged on extended course of antibiotics -Continue to monitor

## 2021-09-19 NOTE — Assessment & Plan Note (Addendum)
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

## 2021-09-19 NOTE — ED Notes (Signed)
Hospitalist at bedside 

## 2021-09-19 NOTE — Hospital Course (Addendum)
Faith [name] is a 66 y.o. female with medical history significant of COPD, GERD, hypertension, hyperlipidemia, neuromuscular disorder- MS bedbound, presents  to ED with a chief complaint of sacral wound.   Patient reports wound started 1 year ago.  She came in today because she has been more fatigued and the pain has been worse.  She describes the pain in the wound as pressure and sharp.  It is worse with positional changes.  It is better when she takes her hydrocodone at home.  She denies fever.  She admits to yellow-green drainage from the wound.  Patient reports this drainage is malodorous.  No dysuria, does report malodorous urine.  She has had no hematuria.  Although patient visibly appears short of breath she reports no shortness of breath, chest pain, palpitations.  Patient reports no nausea vomiting or abdominal pain.  She does report she has had a decrease in appetite.  She is drinking plenty of water and Gatorade at home.  Patient has no other complaints at this time.  ED: Temp 98.4-102.9, respiratory rate 15-24, heart rate 76-93, blood pressure 94/61-108/74, satting at 92-100% Leukocytosis 12.8, hemoglobin 10.3, platelets 295 Chemistries unremarkable Albumin 3.0 UA is indicative of UTI Blood culture pending Urine culture pending CT C-spine is 3.4 mm anterior listhesis at C4 but stable, marked severity multi level degenerative changes CT head shows no acute intracranial abnormality CT pelvis shows mild slightly asymmetric urinary bladder wall thickening and acute osteomyelitis -Follow-up MRI has been recommended on CT/has been determined to be nonurgent, so patient has been admitted to [facility]

## 2021-09-19 NOTE — Assessment & Plan Note (Addendum)
Blood pressure stabilized, resuming beta-blockers

## 2021-09-19 NOTE — Assessment & Plan Note (Addendum)
-   Likely infected  -Associated osteomyelitis -Continue cefepime and vancomycin - Abx per sepsis

## 2021-09-19 NOTE — ED Triage Notes (Signed)
Pt arrived from home via [facility] who report being called to residence by Pts son reporting Pt has increased altered mental status. Pt seen earlier today for a fall. EMS report Pt is febrile now with 102.7 F Temp.  Pt has c-collar in place.

## 2021-09-20 DIAGNOSIS — A419 Sepsis, unspecified organism: Secondary | ICD-10-CM | POA: Diagnosis not present

## 2021-09-20 DIAGNOSIS — L89154 Pressure ulcer of sacral region, stage 4: Secondary | ICD-10-CM | POA: Diagnosis not present

## 2021-09-20 DIAGNOSIS — R627 Adult failure to thrive: Secondary | ICD-10-CM | POA: Diagnosis not present

## 2021-09-20 DIAGNOSIS — D500 Iron deficiency anemia secondary to blood loss (chronic): Secondary | ICD-10-CM

## 2021-09-20 DIAGNOSIS — G35 Multiple sclerosis: Secondary | ICD-10-CM

## 2021-09-20 DIAGNOSIS — R5381 Other malaise: Secondary | ICD-10-CM | POA: Diagnosis not present

## 2021-09-20 DIAGNOSIS — E876 Hypokalemia: Secondary | ICD-10-CM

## 2021-09-20 DIAGNOSIS — M869 Osteomyelitis, unspecified: Secondary | ICD-10-CM | POA: Diagnosis not present

## 2021-09-20 DIAGNOSIS — I10 Essential (primary) hypertension: Secondary | ICD-10-CM | POA: Diagnosis not present

## 2021-09-20 LAB — BASIC METABOLIC PANEL
Anion gap: 5 (ref 5–15)
BUN: 12 mg/dL (ref 8–23)
CO2: 25 mmol/L (ref 22–32)
Calcium: 8.2 mg/dL — ABNORMAL LOW (ref 8.9–10.3)
Chloride: 109 mmol/L (ref 98–111)
Creatinine, Ser: 0.53 mg/dL (ref 0.44–1.00)
GFR, Estimated: >60 mL/min (ref >60)
Glucose, Bld: 94 mg/dL (ref 70–99)
Potassium: 3.3 mmol/L — ABNORMAL LOW (ref 3.5–5.1)
Sodium: 139 mmol/L (ref 135–145)

## 2021-09-20 LAB — PREPARE RBC (CROSSMATCH)

## 2021-09-20 LAB — HEMOGLOBIN AND HEMATOCRIT, BLOOD
HCT: 23.1 % — ABNORMAL LOW (ref 36.0–46.0)
Hemoglobin: 7.2 g/dL — ABNORMAL LOW (ref 12.0–15.0)

## 2021-09-20 LAB — CBC
HCT: 23.4 % — ABNORMAL LOW (ref 36.0–46.0)
Hemoglobin: 7.2 g/dL — ABNORMAL LOW (ref 12.0–15.0)
MCH: 27.2 pg (ref 26.0–34.0)
MCHC: 30.8 g/dL (ref 30.0–36.0)
MCV: 88.3 fL (ref 80.0–100.0)
Platelets: 228 10*3/uL (ref 150–400)
RBC: 2.65 MIL/uL — ABNORMAL LOW (ref 3.87–5.11)
RDW: 16.6 % — ABNORMAL HIGH (ref 11.5–15.5)
WBC: 7.8 10*3/uL (ref 4.0–10.5)
nRBC: 0 % (ref 0.0–0.2)

## 2021-09-20 LAB — PROTIME-INR
INR: 1.2 (ref 0.8–1.2)
Prothrombin Time: 15.1 seconds (ref 11.4–15.2)

## 2021-09-20 LAB — IRON AND TIBC
Iron: 6 ug/dL — ABNORMAL LOW (ref 28–170)
Saturation Ratios: 3 % — ABNORMAL LOW (ref 10.4–31.8)
TIBC: 186 ug/dL — ABNORMAL LOW (ref 250–450)
UIBC: 180 ug/dL

## 2021-09-20 LAB — PROCALCITONIN: Procalcitonin: <0.1 ng/mL

## 2021-09-20 LAB — MAGNESIUM: Magnesium: 1.9 mg/dL (ref 1.7–2.4)

## 2021-09-20 LAB — ABO/RH: ABO/RH(D): O POS

## 2021-09-20 LAB — TSH: TSH: 1.366 u[IU]/mL (ref 0.350–4.500)

## 2021-09-20 MED ORDER — DAKINS (1/4 STRENGTH) 0.125 % EX SOLN
Freq: Two times a day (BID) | CUTANEOUS | Status: DC
Start: 2021-09-20 — End: 2021-09-22
  Filled 2021-09-20: qty 473

## 2021-09-20 MED ORDER — FUROSEMIDE 10 MG/ML IJ SOLN
20.0000 mg | Freq: Once | INTRAMUSCULAR | Status: AC
Start: 2021-09-20 — End: 2021-09-20
  Administered 2021-09-20: 20 mg via INTRAVENOUS
  Filled 2021-09-20: qty 2

## 2021-09-20 MED ORDER — SODIUM CHLORIDE 0.9% IV SOLUTION
Freq: Once | INTRAVENOUS | Status: AC
Start: 2021-09-20 — End: 2021-09-20

## 2021-09-20 MED ORDER — POTASSIUM CHLORIDE CRYS ER 20 MEQ PO TBCR
40.0000 meq | EXTENDED_RELEASE_TABLET | Freq: Once | ORAL | Status: AC
  Administered 2021-09-20: 40 meq via ORAL
  Filled 2021-09-20: qty 2

## 2021-09-20 MED ORDER — SODIUM CHLORIDE 0.9 % IV SOLN
250.0000 mg | Freq: Every day | INTRAVENOUS | Status: AC
  Administered 2021-09-20 – 2021-09-22 (×3): 250 mg via INTRAVENOUS
  Filled 2021-09-20 (×3): qty 20

## 2021-09-20 NOTE — Consult Note (Signed)
Reason for Consult: Stage IV sacral decubitus ulcer Referring Physician: Dr. Kellie Shropshire is an 66 y.o. female.  HPI: Patient is a 66 year old white female with a significant history of COPD, hypertension, neuromuscular disorder, and chronic sacral wound who presented to the emergency room due to increasing fatigue.  Patient was noted to have sepsis of unknown etiology.  I have been asked to look at the sacral decubitus ulcer.  Past Medical History:  Diagnosis Date   Anemia    my whole life, Used to take iron   Arthritis    spine, lumbar   COPD (chronic obstructive pulmonary disease) (HCC)    Emphysema of lung ([place])    GERD (gastroesophageal reflux disease) 04/12/2021   HTN (hypertension) 03/01/2020   Hyperlipidemia 11/28/2019   Narcolepsy    by history, has nightmares   Neuromuscular disorder ([place])    leg issues from spine    Past Surgical History:  Procedure Laterality Date   ANTERIOR CERVICAL DECOMP/DISCECTOMY FUSION N/A 01/19/2019   Procedure: ANTERIOR CERVICAL DECOMPRESSION/DISCECTOMY Cervical three - four;  Surgeon: Ashok Pall, MD;  Location: [facility];  Service: Neurosurgery;  Laterality: N/A;   EYE SURGERY Right    ORIF FACIAL FRACTURE     POSTERIOR CERVICAL LAMINECTOMY N/A 01/19/2019   Procedure: POSTERIOR CERVICAL LAMINECTOMY Removal of Synovial Cyst and posterior cervical fusion;  Surgeon: Ashok Pall, MD;  Location: [facility];  Service: Neurosurgery;  Laterality: N/A;   TEE WITHOUT CARDIOVERSION N/A 03/17/2020   Procedure: TRANSESOPHAGEAL ECHOCARDIOGRAM (TEE) WITH PROPOFOL;  Surgeon: Satira Sark, MD;  [facility];  Service: Cardiovascular;  Laterality: N/A;   TUBAL LIGATION     tubaligation      Family History  Problem Relation Age of Onset   Heart attack Mother    Kidney failure Mother    Diabetes Mother    Heart disease Mother    Hyperlipidemia Mother    Hypertension Mother    Heart attack Father 78   Hypertension Father     Hypertension Sister    Diabetes Sister    Other Brother        house fire   Hypertension Brother    Seizures Brother    Heart disease Brother        mitral valve   Arthritis Daughter        DJD, AS fibromyalgia   Polymyositis Daughter    Cancer Maternal Grandfather        lung   Heart disease Maternal Grandfather    Other Paternal Grandmother        house fire   Alcohol abuse Son     Social History:  reports that she has been smoking cigarettes. She has a 11.25 pack-year smoking history. She has never used smokeless tobacco. She reports that she does not currently use alcohol after a past usage of about 1.0 standard drink of alcohol per week. She reports that she does not use drugs.  Allergies:  Allergies  Allergen Reactions   Darvon [Propoxyphene] Other (See Comments)    Hallicuations   Penicillins Rash    Did it involve swelling of the face/tongue/throat, SOB, or low BP? Unknown Did it involve sudden or severe rash/hives, skin peeling, or any reaction on the inside of your mouth or nose? Unknown Did you need to seek medical attention at a hospital or doctor's office? Unknown When did it last happen?      Unknown If all above answers are "NO", may proceed  with cephalosporin use.     Medications: I have reviewed the patient's current medications.  Results for orders placed or performed during the hospital encounter of 09/19/21 (from the past 48 hour(s))  Comprehensive metabolic panel     Status: Abnormal   Collection Time: 09/19/21  1:35 AM  Result Value Ref Range   Sodium 138 135 - 145 mmol/L   Potassium 3.8 3.5 - 5.1 mmol/L   Chloride 103 98 - 111 mmol/L   CO2 26 22 - 32 mmol/L   Glucose, Bld 129 (H) 70 - 99 mg/dL    Comment: Glucose reference range applies only to samples taken after fasting for at least 8 hours.   BUN 20 8 - 23 mg/dL   Creatinine, Ser 0.64 0.44 - 1.00 mg/dL   Calcium 8.4 (L) 8.9 - 10.3 mg/dL   Total Protein 6.9 6.5 - 8.1 g/dL   Albumin 3.0 (L)  3.5 - 5.0 g/dL   AST 15 15 - 41 U/L   ALT 8 0 - 44 U/L   Alkaline Phosphatase 69 38 - 126 U/L   Total Bilirubin 0.3 0.3 - 1.2 mg/dL   GFR, Estimated >60 >60 mL/min    Comment: (NOTE) Calculated using the CKD-EPI Creatinine Equation (2021)    Anion gap 9 5 - 15    Comment: Performed at [facility], [address], [place], [place] [address]  Lactic acid, plasma     Status: None   Collection Time: 09/19/21  1:35 AM  Result Value Ref Range   Lactic Acid, Venous 1.6 0.5 - 1.9 mmol/L    Comment: Performed at [facility], [address], [place], [place] [address]  CBC with Differential     Status: Abnormal   Collection Time: 09/19/21  1:35 AM  Result Value Ref Range   WBC 12.8 (H) 4.0 - 10.5 K/uL   RBC 3.68 (L) 3.87 - 5.11 MIL/uL   Hemoglobin 10.3 (L) 12.0 - 15.0 g/dL   HCT 32.0 (L) 36.0 - 46.0 %   MCV 87.0 80.0 - 100.0 fL   MCH 28.0 26.0 - 34.0 pg   MCHC 32.2 30.0 - 36.0 g/dL   RDW 16.5 (H) 11.5 - 15.5 %   Platelets 295 150 - 400 K/uL   nRBC 0.0 0.0 - 0.2 %   Neutrophils Relative % 82 %   Neutro Abs 10.5 (H) 1.7 - 7.7 K/uL   Lymphocytes Relative 4 %   Lymphs Abs 0.6 (L) 0.7 - 4.0 K/uL   Monocytes Relative 10 %   Monocytes Absolute 1.3 (H) 0.1 - 1.0 K/uL   Eosinophils Relative 3 %   Eosinophils Absolute 0.3 0.0 - 0.5 K/uL   Basophils Relative 0 %   Basophils Absolute 0.0 0.0 - 0.1 K/uL   Immature Granulocytes 1 %   Abs Immature Granulocytes 0.06 0.00 - 0.07 K/uL    Comment: Performed at [facility], [address], [place], [place] [address]  Protime-INR     Status: None   Collection Time: 09/19/21  1:35 AM  Result Value Ref Range   Prothrombin Time 14.2 11.4 - 15.2 seconds   INR 1.1 0.8 - 1.2    Comment: (NOTE) INR goal varies based on device and disease states. Performed at [facility], [address], [place],  [address]   Culture, blood (Routine x 2)     Status: None (Preliminary result)   Collection Time: 09/19/21  1:35 AM   Specimen: BLOOD RIGHT FOREARM   Result Value  Ref Range   Specimen Description      BLOOD RIGHT FOREARM BOTTLES DRAWN AEROBIC AND ANAEROBIC   Special Requests Blood Culture adequate volume    Culture      NO GROWTH < 24 HOURS Performed at [facility], [address], [place], [place] [address]    Report Status PENDING   Urinalysis, Routine w reflex microscopic Urine, Clean Catch     Status: Abnormal   Collection Time: 09/19/21  1:35 AM  Result Value Ref Range   Color, Urine YELLOW YELLOW   APPearance CLOUDY (A) CLEAR   Specific Gravity, Urine 1.017 1.005 - 1.030   pH 6.0 5.0 - 8.0   Glucose, UA NEGATIVE NEGATIVE mg/dL   Hgb urine dipstick NEGATIVE NEGATIVE   Bilirubin Urine NEGATIVE NEGATIVE   Ketones, ur 5 (A) NEGATIVE mg/dL   Protein, ur 100 (A) NEGATIVE mg/dL   Nitrite NEGATIVE NEGATIVE   Leukocytes,Ua NEGATIVE NEGATIVE   RBC / HPF 11-20 0 - 5 RBC/hpf   WBC, UA 21-50 0 - 5 WBC/hpf   Bacteria, UA RARE (A) NONE SEEN   Mucus PRESENT    Budding Yeast PRESENT     Comment: Performed at [facility], [address], [place], [place] [address]  Resp Panel by RT-PCR (Flu A&B, Covid) Anterior Nasal Swab     Status: None   Collection Time: 09/19/21  1:35 AM   Specimen: Anterior Nasal Swab  Result Value Ref Range   SARS Coronavirus 2 by RT PCR NEGATIVE NEGATIVE    Comment: (NOTE) SARS-CoV-2 target nucleic acids are NOT DETECTED.  The SARS-CoV-2 RNA is generally detectable in upper respiratory specimens during the acute phase of infection. The lowest concentration of SARS-CoV-2 viral copies this assay can detect is 138 copies/mL. A negative result does not preclude SARS-Cov-2 infection and should not be used as the sole basis for treatment or other patient management decisions. A negative result may occur with  improper specimen collection/handling, submission of specimen other than nasopharyngeal swab, presence of viral mutation(s) within the areas targeted by this assay, and inadequate number of  viral copies(<138 copies/mL). A negative result must be combined with clinical observations, patient history, and epidemiological information. The expected result is Negative.  Fact Sheet for Patients:  EntrepreneurPulse.com.au  Fact Sheet for Healthcare Providers:  IncredibleEmployment.be  This test is no t yet approved or cleared by the [place] FDA and  has been authorized for detection and/or diagnosis of SARS-CoV-2 by FDA under an Emergency Use Authorization (EUA). This EUA will remain  in effect (meaning this test can be used) for the duration of the COVID-19 declaration under Section 564(b)(1) of the Act, 21 U.S.C.section 360bbb-3(b)(1), unless the authorization is terminated  or revoked sooner.       Influenza A by PCR NEGATIVE NEGATIVE   Influenza B by PCR NEGATIVE NEGATIVE    Comment: (NOTE) The Xpert Xpress SARS-CoV-2/FLU/RSV plus assay is intended as an aid in the diagnosis of influenza from Nasopharyngeal swab specimens and should not be used as a sole basis for treatment. Nasal washings and aspirates are unacceptable for Xpert Xpress SARS-CoV-2/FLU/RSV testing.  Fact Sheet for Patients: EntrepreneurPulse.com.au  Fact Sheet for Healthcare Providers: IncredibleEmployment.be  This test is not yet approved or cleared by the [place] FDA and has been authorized for detection and/or diagnosis of SARS-CoV-2 by FDA under an Emergency Use Authorization (EUA). This EUA will remain in effect (meaning this test can be used) for the duration of the COVID-19 declaration  under Section 564(b)(1) of the Act, 21 U.S.C. section 360bbb-3(b)(1), unless the authorization is terminated or revoked.  Performed at [facility], [address], [place], [place] [address]   APTT     Status: None   Collection Time: 09/19/21  1:35 AM  Result Value Ref Range   aPTT 35 24 - 36 seconds    Comment: Performed  at [facility], [address], [place], [place] [address]  Culture, blood (Routine x 2)     Status: None (Preliminary result)   Collection Time: 09/19/21  1:42 AM   Specimen: BLOOD RIGHT FOREARM  Result Value Ref Range   Specimen Description      BLOOD RIGHT FOREARM BOTTLES DRAWN AEROBIC AND ANAEROBIC   Special Requests Blood Culture adequate volume    Culture      NO GROWTH < 24 HOURS Performed at [facility], [address] [place], [place] [address]    Report Status PENDING   Lactic acid, plasma     Status: None   Collection Time: 09/19/21  4:04 AM  Result Value Ref Range   Lactic Acid, Venous 1.3 0.5 - 1.9 mmol/L    Comment: Performed at [facility], [address] [place], [place] [address]  Protime-INR     Status: None   Collection Time: 09/20/21  6:32 AM  Result Value Ref Range   Prothrombin Time 15.1 11.4 - 15.2 seconds   INR 1.2 0.8 - 1.2    Comment: (NOTE) INR goal varies based on device and disease states. Performed at [facility], [address], [place], [place] [address]   Magnesium     Status: None   Collection Time: 09/20/21  6:32 AM  Result Value Ref Range   Magnesium 1.9 1.7 - 2.4 mg/dL    Comment: Performed at [facility], [address], [place], [place] [address]  Basic metabolic panel     Status: Abnormal   Collection Time: 09/20/21  6:32 AM  Result Value Ref Range   Sodium 139 135 - 145 mmol/L   Potassium 3.3 (L) 3.5 - 5.1 mmol/L   Chloride 109 98 - 111 mmol/L   CO2 25 22 - 32 mmol/L   Glucose, Bld 94 70 - 99 mg/dL    Comment: Glucose reference range applies only to samples taken after fasting for at least 8 hours.   BUN 12 8 - 23 mg/dL   Creatinine, Ser 0.53 0.44 - 1.00 mg/dL   Calcium 8.2 (L) 8.9 - 10.3 mg/dL   GFR, Estimated >60 >60 mL/min    Comment: (NOTE) Calculated using the CKD-EPI Creatinine Equation (2021)    Anion gap 5 5 - 15    Comment: Performed at [facility], [address] [place], [place] [address]    CT PELVIS WO  CONTRAST  Result Date: 09/19/2021 CLINICAL DATA:  Status post fall. EXAM: CT PELVIS WITHOUT CONTRAST TECHNIQUE: Multidetector CT imaging of the pelvis was performed following the standard protocol without intravenous contrast. RADIATION DOSE REDUCTION: This exam was performed according to the departmental dose-optimization program which includes automated exposure control, adjustment of the mA and/or kV according to patient size and/or use of iterative reconstruction technique. COMPARISON:  December 26, 2020 FINDINGS: Urinary Tract: There is mild urinary bladder wall thickening which is slightly more prominent along the anterolateral aspect of the bladder on the left. Bowel: There is no evidence of bowel dilatation. A large amount of stool is seen throughout the large bowel. Noninflamed diverticula are seen throughout  the sigmoid colon. Vascular/Lymphatic: There is marked severity calcification of the visualized portion of the abdominal aorta and bilateral common iliac arteries, without evidence of aneurysmal dilatation. Reproductive: Uterine atrophy is suspected. The bilateral adnexa are unremarkable. Other:  None. Musculoskeletal: An ill-defined decubitus ulcer is seen along the posterior aspect of the lower sacrum. This represents a new finding when compared to the prior study. A mild amount of associated soft tissue air is seen which extends along the posterior cortex of the lower sacrum on the left (axial CT images 59 through 65, CT series 3). A mild amount of cortical destruction is seen. There is no evidence of an acute fracture or dislocation. Marked severity degenerative changes seen within the visualized portion of the lower lumbar spine. This is most prominent at the level of L4-L5. IMPRESSION: 1. Ill-defined sacral decubitus ulcer along the posterior aspect of the lower sacrum on the left, with additional findings consistent with acute osteomyelitis. Further evaluation with MRI is recommended. 2.  Sigmoid diverticulosis. 3. Mild, slightly asymmetric urinary bladder wall thickening. Correlation with urinalysis is recommended to exclude the presence of an underlying cystitis. 4. Aortic atherosclerosis. Aortic Atherosclerosis (ICD10-I70.0). Electronically Signed   By: Virgina Norfolk M.D.   On: 09/19/2021 04:30   CT CERVICAL SPINE WO CONTRAST  Result Date: 09/19/2021 CLINICAL DATA:  Altered mental status with recent fall. EXAM: CT CERVICAL SPINE WITHOUT CONTRAST TECHNIQUE: Multidetector CT imaging of the cervical spine was performed without intravenous contrast. Multiplanar CT image reconstructions were also generated. RADIATION DOSE REDUCTION: This exam was performed according to the departmental dose-optimization program which includes automated exposure control, adjustment of the mA and/or kV according to patient size and/or use of iterative reconstruction technique. COMPARISON:  Cervical spine CT, dated January 05, 2009 and cervical spine MR, dated March 01, 2020 FINDINGS: Alignment: There is mild reversal of the normal cervical spine lordosis. Stable, approximately 3.4 mm anterolisthesis of the C4 vertebral body is noted on C5 (sagittal reformatted image 32, CT series 5). Skull base and vertebrae: No acute fracture. A metallic density fusion plate and screws are seen along the anterior aspects of the C3 and C4 vertebral bodies. Bilateral metallic density pedicle screws are also seen at these levels, with prior posterior decompression seen at the levels of C3-C4 and C4-C5. Soft tissues and spinal canal: No prevertebral fluid or swelling. No visible canal hematoma. Disc levels: Marked severity endplate sclerosis and moderate severity anterior osteophyte formation are seen at the levels of C5-C6 and C6-C7. Mild to moderate severity endplate sclerosis is seen at the levels of C2-C3 and C4-C5. There is marked severity narrowing of the anterior atlantoaxial articulation, with marked severity intervertebral  disc space narrowing seen at the levels of C4-C5, C5-C6 and C6-C7. Bilateral marked severity multilevel facet joint hypertrophy is noted. Upper chest: Negative. Other: None. IMPRESSION: 1. Stable, approximately 3.4 mm anterolisthesis of the C4 vertebral body on C5. 2. Marked severity multilevel degenerative changes, as described above, without evidence of an acute cervical spine fracture. 3. Stable postoperative changes consistent with C3-C4 anterior fusion and posterior decompression at the levels of C3-C4 and C4-C5. Electronically Signed   By: Virgina Norfolk M.D.   On: 09/19/2021 04:16   CT HEAD WO CONTRAST (5MM)  Result Date: 09/19/2021 CLINICAL DATA:  Status post fall with altered mental status and fever. EXAM: CT HEAD WITHOUT CONTRAST TECHNIQUE: Contiguous axial images were obtained from the base of the skull through the vertex without intravenous contrast. RADIATION DOSE REDUCTION: This exam  was performed according to the departmental dose-optimization program which includes automated exposure control, adjustment of the mA and/or kV according to patient size and/or use of iterative reconstruction technique. COMPARISON:  December 10, 2020 FINDINGS: Brain: There is mild cerebral atrophy with widening of the extra-axial spaces and ventricular dilatation. Stable areas of decreased attenuation are seen within the bilateral periventricular white matter. There is no evidence of an acute infarct, hemorrhage, hydrocephalus, extra-axial collection or mass lesion/mass effect. Vascular: No hyperdense vessel or unexpected calcification. Skull: Stable focal left frontal hyperostosis frontalis internus is seen. Sinuses/Orbits: No acute finding. Other: None. IMPRESSION: 1. No acute intracranial abnormality. 2. Stable areas of decreased attenuation within the bilateral periventricular white matter, consistent with the patient's history of multiple sclerosis. Electronically Signed   By: Virgina Norfolk M.D.   On:  09/19/2021 04:04   DG Chest 1 View  Result Date: 09/19/2021 CLINICAL DATA:  Altered mental status and fever. EXAM: CHEST  1 VIEW COMPARISON:  April 11, 2021 FINDINGS: The heart size and mediastinal contours are within normal limits. There is marked severity calcification of the thoracic aorta. Both lungs are clear. Postoperative changes are seen within the visualized portion of the cervical spine. No acute osseous abnormalities are identified. IMPRESSION: No active cardiopulmonary disease. Electronically Signed   By: Virgina Norfolk M.D.   On: 09/19/2021 02:19   DG Knee Right Port  Result Date: 09/18/2021 CLINICAL DATA:  Fall EXAM: PORTABLE RIGHT KNEE - 4 VIEW COMPARISON:  None Available. FINDINGS: No evidence of fracture, dislocation, or joint effusion. No significant degenerative changes. Diffuse demineralization. Vascular calcifications. IMPRESSION: No acute osseous abnormality. Electronically Signed   By: Yetta Glassman M.D.   On: 09/18/2021 08:29    ROS:  Pertinent items are noted in HPI.  Blood pressure 114/64, pulse 76, temperature 98.4 F (36.9 C), temperature source Oral, resp. rate 20, height '5\' 4"'$  (1.626 m), weight 45.4 kg, SpO2 97 %. Physical Exam: Pleasant white female no acute distress Head is normocephalic, atraumatic Skin: Patient has a large chronic sacral decubitus ulcer, stage IV.  There is some mild necrotic tissue at the base over the bone.  No abscess cavity is present.  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  This has been addressed by multiple clinics in the past.  I do not think this is the source of the patient's sepsis.  This is not amenable to debridement.  We will start Dakins solution dressings.  Discussed with Dr. Roger Shelter.  Aviva Signs 09/20/2021, 8:10 AM

## 2021-09-20 NOTE — Assessment & Plan Note (Addendum)
-   Severe acute on chronic iron deficiency anemia Iron/TIBC/Ferritin/ %Sat    Component Value Date/Time   IRON 6 (L) 09/20/2021 0823   TIBC 186 (L) 09/20/2021 0823   FERRITIN 39 02/16/2021 0507   IRONPCTSAT 3 (L) 09/20/2021 0823      Latest Ref Rng & Units 09/22/2021    4:42 AM 09/21/2021    6:23 AM 09/20/2021    8:23 AM  CBC  WBC 4.0 - 10.5 K/uL 7.8  8.6  7.8   Hemoglobin 12.0 - 15.0 g/dL 11.4  10.7  7.2    7.2   Hematocrit 36.0 - 46.0 % 34.7  33.3  23.1    23.4   Platelets 150 - 400 K/uL 275  250  76    Was Hypotensive with severe generalized weaknesses  -s/p  2 units of PRBC transfusion on  09/20/2021  -IV iron infusion x3 days... We will switch to p.o. supplements

## 2021-09-20 NOTE — Consult Note (Signed)
[facility] Nurse Consult Note: Reason for Consult:Chronic, nonhealing Stage 4 pressure injury.Dr. Arnoldo Morale (Surgery) has seen this morning and provided direction for twice daily sodium hypochlorite solution dampened gauze dressings for the wound.I am in agreement with his POC. Wound type:Pressure Pressure Injury POA: Yes Measurement:Per Nursing Flow Sheet, 6cm x 7cm x approximately 2.5cm depth with circumferential undermining Wound bed:red, non granulating Drainage (amount, consistency, odor) small to moderate serous Periwound:intact Dressing procedure/placement/frequency: Dressings are as noted above. I have added a mattress replacement with low air los feature and turning and repositioning per house protocol to the POC and also floatation of heels.  [facility] nursing team will not follow, but will remain available to this patient, the nursing and medical teams.  Please re-consult if needed.  Thank you for inviting [place] to participate in this patient's Plan of Care.  Maudie Flakes, MSN, RN, CNS, [place], Serita Grammes, [employer], [employer] phone:  [phone]

## 2021-09-20 NOTE — Plan of Care (Signed)
  Problem: Acute Rehab PT Goals(only PT should resolve) Goal: Pt Will Go Supine/Side To Sit Outcome: Progressing Flowsheets (Taken 09/20/2021 1056) Pt will go Supine/Side to Sit: with minimal assist Goal: Patient Will Transfer Sit To/From Stand Outcome: Progressing Flowsheets (Taken 09/20/2021 1056) Patient will transfer sit to/from stand: with minimal assist Goal: Pt Will Transfer Bed To Chair/Chair To Bed Outcome: Progressing Flowsheets (Taken 09/20/2021 1056) Pt will Transfer Bed to Chair/Chair to Bed: with min assist Goal: Pt Will Perform Standing Balance Or Pre-Gait Outcome: Progressing Flowsheets (Taken 09/20/2021 1056) Pt will perform standing balance or pre-gait:  with minimal assist  1-2 min

## 2021-09-20 NOTE — Evaluation (Signed)
Physical Therapy Evaluation Patient Details Name: Faith [name] MRN: [id number] DOB: May 23, 1955 Today's Date: 09/20/2021  History of Present Illness  Faith [name] is a 66 y.o. female with medical history significant of COPD, GERD, hypertension, hyperlipidemia, neuromuscular disorder, and more presents ED with a chief complaint of sacral wound.  Patient reports wound started 1 year ago.  She came in today because she has been more fatigued and the pain has been worse.  She describes the pain in the wound as pressure and sharp.  It is worse with positional changes.  It is better when she takes her hydrocodone at home.  She denies fever.  She admits to yellow-green drainage from the wound.  Patient reports this drainage is malodorous.  Orts no dysuria, does report malodorous urine.  She has had no hematuria.  Although patient visibly appears short of breath she reports no shortness of breath, chest pain, palpitations.  Patient reports no nausea vomiting or abdominal pain.  She does report she has had a decrease in appetite.  She is drinking plenty of water and Gatorade at home.  Patient has no other complaints at this time    Clinical Impression  Patient lying in bed on therapist arrival. She reports high pain levels but agreeable to therapy assessment. Supine to sit with mod A for legs and to come fully to edge of bed.  Noted weakness of the legs and trunk and pain that limits tolerance for movement.  She needs min to mod A to bring trunk fully upright and has to use both upper extremities to balance on edge of bed. Patient reports increased pain with movement and that she is light-headed with sitting up so we did not attempt to stand or transfer.  Patient returned back to lying ; needs mod A for legs and to scoot up in bed.  Nurse call bell left in reach. Therapist notified nursing of patient mobility status and high pain level.  Patient will benefit from continued skilled therapy services during the  remainder of her hospital stay and at the next recommended venue of care to address deficits and promote optimal function.       Recommendations for follow up therapy are one component of a multi-disciplinary discharge planning process, led by the attending physician.  Recommendations may be updated based on patient status, additional functional criteria and insurance authorization.  Follow Up Recommendations Skilled nursing-short term rehab (<3 hours/day) Can patient physically be transported by private vehicle: No    Assistance Recommended at Discharge Frequent or constant Supervision/Assistance  Patient can return home with the following  A lot of help with bathing/dressing/bathroom;A lot of help with walking and/or transfers;Help with stairs or ramp for entrance    Equipment Recommendations None recommended by PT  Recommendations for Other Services       Functional Status Assessment Patient has had a recent decline in their functional status and demonstrates the ability to make significant improvements in function in a reasonable and predictable amount of time.     Precautions / Restrictions Precautions Precautions: Fall Restrictions Weight Bearing Restrictions: No      Mobility  Bed Mobility Overal bed mobility: Needs Assistance Bed Mobility: Supine to Sit, Sit to Supine     Supine to sit: HOB elevated, Mod assist Sit to supine: Mod assist   General bed mobility comments: needs A for legs    Transfers  General transfer comment: patient lightheaded sitting up today; did not attempt transfer    Ambulation/Gait               General Gait Details: non ambulatory  Stairs            Wheelchair Mobility    Modified Rankin (Stroke Patients Only)       Balance Overall balance assessment: Needs assistance Sitting-balance support: Bilateral upper extremity supported, Feet supported Sitting balance-Leahy Scale: Fair Sitting  balance - Comments: fair sitting balance; tends to lean back and needs UE's to support for safety                                     Pertinent Vitals/Pain Pain Assessment Pain Assessment: 0-10 Pain Score: 8  Pain Location: sacral area Pain Intervention(s): Monitored during session, Limited activity within patient's tolerance, Patient requesting pain meds-RN notified    Home Living Family/patient expects to be discharged to:: Private residence Living Arrangements: Children Available Help at Discharge: Family;Personal care attendant;Available PRN/intermittently Type of Home: House Home Access: Ramped entrance       Home Layout: One level Home Equipment: Conservation officer, nature (2 wheels);Rollator (4 wheels);BSC/3in1;Wheelchair - power;Adaptive equipment Additional Comments: [facility] going to [facility] 3x/[facility] for therapies and ADL assist; has daughter-in-law visit the other 2x/wk for sacral wound care. Lives with son who "is there almost all the time"; did have a CNA but she has not been coming for the past 2 months; trying to find her another    Prior Function Prior Level of Function : Needs assist             Mobility Comments: Typically mod indep with standing/step pivot transfers to/from electric scooter with rollator (prefers use of rollator with brakes locked). Sleeps in recliner, sometimes using a donut pillow due to sacral wound; reports frequent repositioning for pressure relief ADLs Comments: needs assist for sponge bath; usually CNA assisted 2 x a week and had a shower at [facility] 1 x a week     Hand Dominance   Dominant Hand: Right    Extremity/Trunk Assessment   Upper Extremity Assessment Upper Extremity Assessment: Defer to OT evaluation    Lower Extremity Assessment Lower Extremity Assessment: Generalized weakness       Communication   Communication: No difficulties  Cognition Arousal/Alertness: Awake/alert Behavior During Therapy: WFL for tasks  assessed/performed Overall Cognitive Status: Within Functional Limits for tasks assessed                                          General Comments General comments (skin integrity, edema, etc.): sacral wound painful with bed mobility    Exercises     Assessment/Plan    PT Assessment Patient needs continued PT services  PT Problem List Decreased strength;Decreased range of motion;Decreased activity tolerance;Decreased skin integrity;Decreased balance;Pain;Decreased mobility       PT Treatment Interventions DME instruction;Balance training;Neuromuscular re-education;Functional mobility training;Patient/family education;Therapeutic activities;Therapeutic exercise    PT Goals (Current goals can be found in the Care Plan section)  Acute Rehab PT Goals Patient Stated Goal: return home PT Goal Formulation: With patient Time For Goal Achievement: 10/04/21 Potential to Achieve Goals: Good    Frequency Min 2X/week     Co-evaluation  AM-PAC PT "6 Clicks" Mobility  Outcome Measure Help needed turning from your back to your side while in a flat bed without using bedrails?: A Little Help needed moving from lying on your back to sitting on the side of a flat bed without using bedrails?: A Little Help needed moving to and from a bed to a chair (including a wheelchair)?: A Lot Help needed standing up from a chair using your arms (e.g., wheelchair or bedside chair)?: A Lot Help needed to walk in hospital room?: Total Help needed climbing 3-5 steps with a railing? : Total 6 Click Score: 12    End of Session   Activity Tolerance: Patient limited by pain Patient left: in bed;with call bell/phone within reach Nurse Communication: Mobility status PT Visit Diagnosis: Muscle weakness (generalized) (M62.81);Pain    Time: 3968-8648 PT Time Calculation (min) (ACUTE ONLY): 20 min   Charges:   PT Evaluation $PT Eval Low Complexity: 1 Low PT  Treatments $Therapeutic Activity: 8-22 mins        10:55 AM, 09/20/21 Dawnna Gritz Small Maguire Killmer MPT [place] [facility] [place] [phone]

## 2021-09-20 NOTE — Assessment & Plan Note (Addendum)
With severe debility, bedbound -Falling, -Steadily declining -Recommending palliative care-discussed with daughter in detail

## 2021-09-20 NOTE — Progress Notes (Signed)
Ferric Gluconate not given at 1330 due to blood currently infusing. Patient has two Ivs, but Pharmacy stated to wait until blood finished before hanging the ferric gluconate in case patient has a reaction. MD Shahmehdi notified.

## 2021-09-20 NOTE — Progress Notes (Signed)
Lab called stating hemoglobin went from 10.3 to 7.2. MD shahmehdi notified to see if he would like a hemoglobin and hematocrit redrawn.

## 2021-09-20 NOTE — Progress Notes (Signed)
Portable equipment called for an air mattress.

## 2021-09-20 NOTE — Consult Note (Signed)
[facility] for Infectious Disease       Reason for Consult: osteomyelitis    Referring Physician: Dr. Roger Shelter  Principal Problem:   Sepsis [facility]) Active Problems:   Multiple sclerosis ([place])   COPD (chronic obstructive pulmonary disease) ([place])   Essential hypertension   Sacral decubitus ulcer, stage IV (HCC)   Protein-calorie malnutrition, moderate (HCC)   Failure to thrive in adult   Hypokalemia   Debility   Osteomyelitis ([place])   UTI (urinary tract infection)   Iron deficiency anemia due to chronic blood loss    sodium chloride   Intravenous Once   ascorbic acid  500 mg Oral Daily   atorvastatin  20 mg Oral Daily   baclofen  10 mg Oral TID   feeding supplement  237 mL Oral TID BM   gabapentin  600 mg Oral TID   polyethylene glycol  17 g Oral Daily   sodium hypochlorite   Irrigation BID   zinc sulfate  220 mg Oral Daily    Recommendations: Continue with vancomycin and cefepime Monitor blood cultures  Assessment: She has a chronic, non-healing pressure ulcer and remains bedbound due to her underlying MS.  CT scan noted and there is a mild amount of cortical destruction noted which is equivocal for osteomyelitis.  She is a smoker and with a low albumin.  Also, it is unclear how much offloading she does.  At this time, no  for osteomyelitis and she should continue with wound care efforts, nutrition and smoking cessation and if it continues to be non-healing after discharge, can reevaluate for osteomyelitis with a multi-disciplinary approach.  Continue outpatient wound care. Fever - unclear etiology.  Lactate normal, WBC normal.  No clinical signs of infection of ulcer.  UA with minimal WBCs.  Continue with cefepime and vancomycin and monitor blood cultures.   Dr. Tommy Medal on tomorrow  Antibiotics: Vancomycin + cefepime  HPI: Faith [name] is a 66 y.o. female with a history of MS and largely bedbound, history of JCV, who came in with fever and tachycardia.   Temperature 102.9.  Lactate normal.  Mild leukocytosis now resolved.  Patient recently established care with neurology for MS treatment and was to start [facility] but did not go for follow up.   Does not appear she started or contnued this.  Followed by wound care.    Review of Systems:  Constitutional: negative for fevers and chills All other systems reviewed and are negative    Past Medical History:  Diagnosis Date   Anemia    my whole life, Used to take iron   Arthritis    spine, lumbar   COPD (chronic obstructive pulmonary disease) (HCC)    Emphysema of lung (HCC)    GERD (gastroesophageal reflux disease) 04/12/2021   HTN (hypertension) 03/01/2020   Hyperlipidemia 11/28/2019   Narcolepsy    by history, has nightmares   Neuromuscular disorder ([place])    leg issues from spine    Social History   Tobacco Use   Smoking status: Every Day    Packs/day: 0.25    Years: 45.00    Total pack years: 11.25    Types: Cigarettes   Smokeless tobacco: Never   Tobacco comments:    cutting down  Vaping Use   Vaping Use: Never used  Substance Use Topics   Alcohol use: Not Currently    Alcohol/week: 1.0 standard drink of alcohol    Types: 1 Cans of beer per week  Drug use: No    Family History  Problem Relation Age of Onset   Heart attack Mother    Kidney failure Mother    Diabetes Mother    Heart disease Mother    Hyperlipidemia Mother    Hypertension Mother    Heart attack Father 18   Hypertension Father    Hypertension Sister    Diabetes Sister    Other Brother        house fire   Hypertension Brother    Seizures Brother    Heart disease Brother        mitral valve   Arthritis Daughter        DJD, AS fibromyalgia   Polymyositis Daughter    Cancer Maternal Grandfather        lung   Heart disease Maternal Grandfather    Other Paternal Grandmother        house fire   Alcohol abuse Son     Allergies  Allergen Reactions   Darvon [Propoxyphene] Other (See Comments)     Hallicuations   Penicillins Rash    Did it involve swelling of the face/tongue/throat, SOB, or low BP? Unknown Did it involve sudden or severe rash/hives, skin peeling, or any reaction on the inside of your mouth or nose? Unknown Did you need to seek medical attention at a hospital or doctor's office? Unknown When did it last happen?      Unknown If all above answers are "NO", may proceed with cephalosporin use.     Physical Exam: Constitutional: in no apparent distress  Vitals:   09/20/21 0442 09/20/21 0820  BP: 114/64 117/69  Pulse: 76 75  Resp: 20 18  Temp: 98.4 F (36.9 C) 98.6 F (37 C)  SpO2: 97% 97%    Lab Results  Component Value Date   WBC 7.8 09/20/2021   HGB 7.2 (L) 09/20/2021   HGB 7.2 (L) 09/20/2021   HCT 23.1 (L) 09/20/2021   HCT 23.4 (L) 09/20/2021   MCV 88.3 09/20/2021   PLT 228 09/20/2021    Lab Results  Component Value Date   CREATININE 0.53 09/20/2021   BUN 12 09/20/2021   NA 139 09/20/2021   K 3.3 (L) 09/20/2021   CL 109 09/20/2021   CO2 25 09/20/2021    Lab Results  Component Value Date   ALT 8 09/19/2021   AST 15 09/19/2021   ALKPHOS 69 09/19/2021     Microbiology: Recent Results (from the past 240 hour(s))  Culture, blood (Routine x 2)     Status: None (Preliminary result)   Collection Time: 09/19/21  1:35 AM   Specimen: BLOOD RIGHT FOREARM  Result Value Ref Range Status   Specimen Description   Final    BLOOD RIGHT FOREARM BOTTLES DRAWN AEROBIC AND ANAEROBIC   Special Requests Blood Culture adequate volume  Final   Culture   Final    NO GROWTH < 24 HOURS Performed at [facility], [address], [place], [place] [address]    Report Status PENDING  Incomplete  Resp Panel by RT-PCR (Flu A&B, Covid) Anterior Nasal Swab     Status: None   Collection Time: 09/19/21  1:35 AM   Specimen: Anterior Nasal Swab  Result Value Ref Range Status   SARS Coronavirus 2 by RT PCR NEGATIVE NEGATIVE Final    Comment: (NOTE) SARS-CoV-2 target  nucleic acids are NOT DETECTED.  The SARS-CoV-2 RNA is generally detectable in upper respiratory specimens during the acute phase of  infection. The lowest concentration of SARS-CoV-2 viral copies this assay can detect is 138 copies/mL. A negative result does not preclude SARS-Cov-2 infection and should not be used as the sole basis for treatment or other patient management decisions. A negative result may occur with  improper specimen collection/handling, submission of specimen other than nasopharyngeal swab, presence of viral mutation(s) within the areas targeted by this assay, and inadequate number of viral copies(<138 copies/mL). A negative result must be combined with clinical observations, patient history, and epidemiological information. The expected result is Negative.  Fact Sheet for Patients:  EntrepreneurPulse.com.au  Fact Sheet for Healthcare Providers:  IncredibleEmployment.be  This test is no t yet approved or cleared by the [place] FDA and  has been authorized for detection and/or diagnosis of SARS-CoV-2 by FDA under an Emergency Use Authorization (EUA). This EUA will remain  in effect (meaning this test can be used) for the duration of the COVID-19 declaration under Section 564(b)(1) of the Act, 21 U.S.C.section 360bbb-3(b)(1), unless the authorization is terminated  or revoked sooner.       Influenza A by PCR NEGATIVE NEGATIVE Final   Influenza B by PCR NEGATIVE NEGATIVE Final    Comment: (NOTE) The Xpert Xpress SARS-CoV-2/FLU/RSV plus assay is intended as an aid in the diagnosis of influenza from Nasopharyngeal swab specimens and should not be used as a sole basis for treatment. Nasal washings and aspirates are unacceptable for Xpert Xpress SARS-CoV-2/FLU/RSV testing.  Fact Sheet for Patients: EntrepreneurPulse.com.au  Fact Sheet for Healthcare  Providers: IncredibleEmployment.be  This test is not yet approved or cleared by the [place] FDA and has been authorized for detection and/or diagnosis of SARS-CoV-2 by FDA under an Emergency Use Authorization (EUA). This EUA will remain in effect (meaning this test can be used) for the duration of the COVID-19 declaration under Section 564(b)(1) of the Act, 21 U.S.C. section 360bbb-3(b)(1), unless the authorization is terminated or revoked.  Performed at [facility], [address], [place], [place] [address]   Urine Culture     Status: None (Preliminary result)   Collection Time: 09/19/21  1:35 AM   Specimen: In/Out Cath Urine  Result Value Ref Range Status   Specimen Description   Final    IN/OUT CATH URINE Performed at [facility], [address] [place], [place] [address]    Special Requests   Final    NONE Performed at [facility], [address] [place], [place] [address]    Culture   Final    CULTURE REINCUBATED FOR BETTER GROWTH Performed at [facility], [place] [address] [place], [place] [address]    Report Status PENDING  Incomplete  Culture, blood (Routine x 2)     Status: None (Preliminary result)   Collection Time: 09/19/21  1:42 AM   Specimen: BLOOD RIGHT FOREARM  Result Value Ref Range Status   Specimen Description   Final    BLOOD RIGHT FOREARM BOTTLES DRAWN AEROBIC AND ANAEROBIC   Special Requests Blood Culture adequate volume  Final   Culture   Final    NO GROWTH < 24 HOURS Performed at [facility], [address], [place],  [address]    Report Status PENDING  Incomplete    Thayer Headings, [place] for Infectious Disease [facility] [email or web address] 09/20/2021, 12:49 PM

## 2021-09-20 NOTE — Progress Notes (Signed)
PROGRESS NOTE    Patient: Faith [name]                            PCP: Janifer Adie, MD                    DOB: 12/03/1955            DOA: 09/19/2021 [id number]             DOS: 09/20/2021, 11:28 AM   LOS: 1 day   Date of Service: The patient was seen and examined on 09/20/2021  Subjective:   Was seen and examined this morning, stable no acute distress, laying in bed comfortably,  Sacral wound was inspected and examined with nursing staff at bedside Negative for any discharge or malodorous... Not draining, tissue noted  No complaints at this time, hemodynamically stable For drop in H&H No report of bleeding per nursing staff or patient  Brief Narrative:   Faith [name] is a 66 y.o. female with medical history significant of COPD, GERD, hypertension, hyperlipidemia, neuromuscular disorder- MS bedbound, presents  to ED with a chief complaint of sacral wound.   Patient reports wound started 1 year ago.  She came in today because she has been more fatigued and the pain has been worse.  She describes the pain in the wound as pressure and sharp.  It is worse with positional changes.  It is better when she takes her hydrocodone at home.  She denies fever.  She admits to yellow-green drainage from the wound.  Patient reports this drainage is malodorous.  No dysuria, does report malodorous urine.  She has had no hematuria.  Although patient visibly appears short of breath she reports no shortness of breath, chest pain, palpitations.  Patient reports no nausea vomiting or abdominal pain.  She does report she has had a decrease in appetite.  She is drinking plenty of water and Gatorade at home.  Patient has no other complaints at this time.  ED: Temp 98.4-102.9, respiratory rate 15-24, heart rate 76-93, blood pressure 94/61-108/74, satting at 92-100% Leukocytosis 12.8, hemoglobin 10.3, platelets 295 Chemistries unremarkable Albumin 3.0 UA is indicative of UTI Blood culture pending Urine  culture pending CT C-spine is 3.4 mm anterior listhesis at C4 but stable, marked severity multi level degenerative changes CT head shows no acute intracranial abnormality CT pelvis shows mild slightly asymmetric urinary bladder wall thickening and acute osteomyelitis -Follow-up MRI has been recommended on CT/has been determined to be nonurgent, so patient has been admitted to [place]:   Principal Problem:   Sepsis ([place]) Active Problems:   Iron deficiency anemia due to chronic blood loss   Sacral decubitus ulcer, stage IV (HCC)   Osteomyelitis (HCC)   UTI (urinary tract infection)   COPD (chronic obstructive pulmonary disease) (HCC)   Essential hypertension   Protein-calorie malnutrition, moderate (HCC)   Multiple sclerosis (HCC)   Failure to thrive in adult   Hypokalemia   Debility     Assessment and Plan: * Sepsis ([place]) -Much improved -Resolved sepsis physiology  Blood pressure 117/69, pulse 75, temperature 98.6 F (37 C), resp. rate 18, height _0  (1.626 m), weight 45.4 kg, SpO2 97 %.    On admission met Sepsis criteria -secondary to osteomyelitis versus UTI SIRS criteria temp 102.9, heart rate 93, respiratory rate 24, leukocytosis 12.8, with altered mental status Most likely  source of infection is osteomyelitis Life threatening organ dysfunction 2/2 infection is evidenced by: Altered mental status Antibiotics started initially started on Rocephin and vancomycin, will broaden to cefepime and vancomycin Fluids given prior to admission 2 L bolus Continue LR at 125 mill per hour>>> will taper down Sepsis order set utilized  Iron deficiency anemia due to chronic blood loss - Severe acute on chronic iron deficiency anemia Iron/TIBC/Ferritin/ %Sat    Component Value Date/Time   IRON 6 (L) 09/20/2021 0823   TIBC 186 (L) 09/20/2021 0823   FERRITIN 39 02/16/2021 0507   IRONPCTSAT 3 (L) 09/20/2021 0823       Latest Ref Rng & Units 09/20/2021     8:23 AM 09/19/2021    1:35 AM 04/28/2021    2:32 PM  CBC  WBC 4.0 - 10.5 K/uL 7.8  12.8  9.9   Hemoglobin 12.0 - 15.0 g/dL 12.0 - 15.0 g/dL 7.2    7.2  10.3  11.1   Hematocrit 36.0 - 46.0 % 36.0 - 46.0 % 23.1    23.4  32.0  34.0   Platelets 150 - 400 K/uL 228  295  328     Hypotensive with severe generalized weaknesses  -Pros and cons of blood transfusion were discussed with the patient--- proceed with transfusing 2 units of PRBC today 09/20/2021  -IV iron infusion x3 days... We will switch to p.o. supplements  UTI (urinary tract infection) - UA indicative of UTI -Continue cefepime -Urine culture >>>> no growth to date  Osteomyelitis (HCC) - Osteomyelitis seen on CT scan of pelvis - We will Continue cefepime and vancomycin -Blood cultures >>> no growth to date -Plan to narrow antibiotics based on cultures -We will likely have to be discharged on extended course of antibiotics -Continue to monitor -ID consulted for further recommendation  Sacral decubitus ulcer, stage IV (HCC) - Consulted, status post thorough inspection -Restarted this is not a source of infection or sepsis  -Associated osteomyelitis -Continue cefepime and vancomycin >>> will narrow down antibiotics -Currently afebrile normotensive improved leukocytosis - Abx per sepsis  Protein-calorie malnutrition, moderate (HCC) - Albumin 3.0 -Continue feeding supplement -Continue to encourage nutrient dense food choices  Essential hypertension - BP soft, but stable  - Cont. To Hold BB today   COPD (chronic obstructive pulmonary disease) (HCC) - stable  -Remain on RA satting 100% -Current smoker -Counseled on the importance of cessation -no inhalers on home med list -albuterol PRN  -Continue to monitor  Debility -Stable - Severe debility, bedbound due to progressive MS -Continue monitoring closely, patient recently received a hospital bed at home -Recommend every 2 hours position change -PT OT and  fall precautions  Hypokalemia - Monitoring and repleting checking magnesium   Failure to thrive in adult Body mass index is 17.17 kg/m. -Due to chronic illness -Dietitian consulted Continue dietary supplements  Multiple sclerosis ([place]) With severe debility, bedbound   ----------------------------------------------------------------------------------------------------------------------------------------------- Nutritional status:  The patient's BMI is: Body mass index is 17.17 kg/m. I agree with the assessment and plan as outlined below:  Skin Assessment: I have examined the patient's skin and I agree with the wound assessment as performed by wound care team As outlined belowe: Pressure Injury 03/13/20 Buttocks Right Unstageable - Full thickness tissue loss in which the base of the injury is covered by slough (yellow, tan, gray, green or [place]oll[place]) and/or eschar (tan, [place] or black) in the wound bed. (Active)  03/13/20 1800  Location: Buttocks  Location Orientation: Right  Staging:  Unstageable - Full thickness tissue loss in which the base of the injury is covered by slough (yellow, tan, gray, green or [place]) and/or eschar (tan, [place] or black) in the wound bed.  Wound Description (Comments):   Present on Admission: Yes     Pressure Injury 02/15/21 Sacrum Medial Stage 4 - Full thickness tissue loss with exposed bone, tendon or muscle. 4cm x 4 cm x 2.5cm (Active)  02/15/21 0420  Location: Sacrum  Location Orientation: Medial  Staging: Stage 4 - Full thickness tissue loss with exposed bone, tendon or muscle.  Wound Description (Comments): 4cm x 4 cm x 2.5cm  Present on Admission: Yes     Pressure Injury 09/19/21 Sacrum Posterior Stage 4 - Full thickness tissue loss with exposed bone, tendon or muscle. (Active)  09/19/21 1111  Location: Sacrum  Location Orientation: Posterior  Staging: Stage 4 - Full thickness tissue loss with exposed bone, tendon or muscle.  Wound Description  (Comments):   Present on Admission: Yes  Dressing Type Foam - Lift dressing to assess site every shift;ABD;Moist to dry 09/19/21 2355     ---------------------------------------------------------------------------------------------------------------------------------------------------- Cultures; Blood Cultures x 2 >> NGT Urine Culture  >>> NGT  Wound culture >> no cultures were obtained  ------------------------------------------------------------------------------------------------------------------------------------------------  DVT prophylaxis:  heparin injection 5,000 Units Start: 09/19/21 0600 SCDs Start: 09/19/21 0523   Code Status:   Code Status: Full Code  Family Communication: No family member present at bedside- attempt will be made to update daily The above findings and plan of care has been discussed with patient (and family)  in detail,  they expressed understanding and agreement of above. -Advance care planning has been discussed.   Admission status:   Status is: Inpatient Remains inpatient appropriate because: Needing broad-spectrum IV antibiotics, IV fluids.  Surgical evaluation     Procedures:   No admission procedures for hospital encounter.   Antimicrobials:  Anti-infectives (From admission, onward)    Start     Dose/Rate Route Frequency Ordered Stop   09/20/21 0400  vancomycin (VANCOREADY) IVPB 750 mg/150 mL        750 mg 150 mL/hr over 60 Minutes Intravenous Every 24 hours 09/19/21 0554     09/19/21 0600  ceFEPIme (MAXIPIME) 2 g in sodium chloride 0.9 % 100 mL IVPB        2 g 200 mL/hr over 30 Minutes Intravenous Every 12 hours 09/19/21 0554     09/19/21 0415  vancomycin (VANCOCIN) IVPB 1000 mg/200 mL premix        1,000 mg 200 mL/hr over 60 Minutes Intravenous  Once 09/19/21 0403 09/19/21 0554   09/19/21 0145  cefTRIAXone (ROCEPHIN) 2 g in sodium chloride 0.9 % 100 mL IVPB        2 g 200 mL/hr over 30 Minutes Intravenous  Once 09/19/21 0143  09/19/21 0316        Medication:   sodium chloride   Intravenous Once   ascorbic acid  500 mg Oral Daily   atorvastatin  20 mg Oral Daily   baclofen  10 mg Oral TID   feeding supplement  237 mL Oral TID BM   furosemide  20 mg Intravenous Once   gabapentin  600 mg Oral TID   heparin  5,000 Units Subcutaneous Q8H   polyethylene glycol  17 g Oral Daily   sodium hypochlorite   Irrigation BID   zinc sulfate  220 mg Oral Daily    albuterol, diazepam, HYDROcodone-acetaminophen   Objective:   Vitals:  09/19/21 2356 09/20/21 0153 09/20/21 0442 09/20/21 0820  BP: 101/68 106/65 114/64 117/69  Pulse: 80 78 76 75  Resp: _0 Temp: 98.3 F (36.8 C) 99 F (37.2 C) 98.4 F (36.9 C) 98.6 F (37 C)  TempSrc: Oral Oral Oral   SpO2: 100% 97% 97% 97%  Weight:      Height:        Intake/Output Summary (Last 24 hours) at 09/20/2021 1128 Last data filed at 09/20/2021 0911 Gross per 24 hour  Intake 4454.33 ml  Output 1400 ml  Net 3054.33 ml   Filed Weights   09/19/21 0130  Weight: 45.4 kg     Examination:     Physical Exam:   General:  Cachectic chronically ill looking female AAO x 3,  cooperative, no distress;   HEENT:  Normocephalic, PERRL, otherwise with in Normal limits   Neuro:  CNII-XII intact. , normal motor and sensation, reflexes intact   Lungs:   Clear to auscultation BL, Respirations unlabored,  No wheezes / crackles  Cardio:    S1/S2, RRR, No murmure, No Rubs or Gallops   Abdomen:  Soft, non-tender, bowel sounds active all four quadrants, no guarding or peritoneal signs.  Muscular  skeletal:  Limited exam -global generalized weaknesses Bedbound - in bed, able to move all 4 extremities,   2+ pulses,  symmetric, No pitting edema  Skin:  Dry, warm to touch, negative for any Rashes, stage IV decubitus see below  Wounds: Please see nursing documentation  Pressure Injury 03/13/20 Buttocks Right Unstageable - Full thickness tissue loss in which the base  of the injury is covered by slough (yellow, tan, gray, green or [place]) and/or eschar (tan, [place] or black) in the wound bed. (Active)  03/13/20 1800  Location: Buttocks  Location Orientation: Right  Staging: Unstageable - Full thickness tissue loss in which the base of the injury is covered by slough (yellow, tan, gray, green or [place]) and/or eschar (tan, [place] or black) in the wound bed.  Wound Description (Comments):   Present on Admission: Yes     Pressure Injury 02/15/21 Sacrum Medial Stage 4 - Full thickness tissue loss with exposed bone, tendon or muscle. 4cm x 4 cm x 2.5cm (Active)  02/15/21 0420  Location: Sacrum  Location Orientation: Medial  Staging: Stage 4 - Full thickness tissue loss with exposed bone, tendon or muscle.  Wound Description (Comments): 4cm x 4 cm x 2.5cm  Present on Admission: Yes     Pressure Injury 09/19/21 Sacrum Posterior Stage 4 - Full thickness tissue loss with exposed bone, tendon or muscle. (Active)  09/19/21 1111  Location: Sacrum  Location Orientation: Posterior  Staging: Stage 4 - Full thickness tissue loss with exposed bone, tendon or muscle.  Wound Description (Comments):   Present on Admission: Yes  Dressing Type Foam - Lift dressing to assess site every shift;ABD;Moist to dry 09/19/21 2355         ------------------------------------------------------------------------------------------------------------------------------------------    LABs:     Latest Ref Rng & Units 09/20/2021    8:23 AM 09/19/2021    1:35 AM 04/28/2021    2:32 PM  CBC  WBC 4.0 - 10.5 K/uL 7.8  12.8  9.9   Hemoglobin 12.0 - 15.0 g/dL 12.0 - 15.0 g/dL 7.2    7.2  10.3  11.1   Hematocrit 36.0 - 46.0 % 36.0 - 46.0 % 23.1    23.4  32.0  34.0   Platelets 150 - 400 K/uL  228  295  328       Latest Ref Rng & Units 09/20/2021    6:32 AM 09/19/2021    1:35 AM 04/14/2021   12:33 AM  CMP  Glucose 70 - 99 mg/dL 94  129  170   BUN 8 - 23 mg/dL 12  20  34   Creatinine  0.44 - 1.00 mg/dL 0.53  0.64  0.72   Sodium 135 - 145 mmol/L 139  138  139   Potassium 3.5 - 5.1 mmol/L 3.3  3.8  3.8   Chloride 98 - 111 mmol/L 109  103  104   CO2 22 - 32 mmol/L _0 Calcium 8.9 - 10.3 mg/dL 8.2  8.4  8.9   Total Protein 6.5 - 8.1 g/dL  6.9    Total Bilirubin 0.3 - 1.2 mg/dL  0.3    Alkaline Phos 38 - 126 U/L  69    AST 15 - 41 U/L  15    ALT 0 - 44 U/L  8         Micro Results Recent Results (from the past 240 hour(s))  Culture, blood (Routine x 2)     Status: None (Preliminary result)   Collection Time: 09/19/21  1:35 AM   Specimen: BLOOD RIGHT FOREARM  Result Value Ref Range Status   Specimen Description   Final    BLOOD RIGHT FOREARM BOTTLES DRAWN AEROBIC AND ANAEROBIC   Special Requests Blood Culture adequate volume  Final   Culture   Final    NO GROWTH < 24 HOURS Performed at [facility], [address] [place], [place] [address]    Report Status PENDING  Incomplete  Resp Panel by RT-PCR (Flu A&B, Covid) Anterior Nasal Swab     Status: None   Collection Time: 09/19/21  1:35 AM   Specimen: Anterior Nasal Swab  Result Value Ref Range Status   SARS Coronavirus 2 by RT PCR NEGATIVE NEGATIVE Final    Comment: (NOTE) SARS-CoV-2 target nucleic acids are NOT DETECTED.  The SARS-CoV-2 RNA is generally detectable in upper respiratory specimens during the acute phase of infection. The lowest concentration of SARS-CoV-2 viral copies this assay can detect is 138 copies/mL. A negative result does not preclude SARS-Cov-2 infection and should not be used as the sole basis for treatment or other patient management decisions. A negative result may occur with  improper specimen collection/handling, submission of specimen other than nasopharyngeal swab, presence of viral mutation(s) within the areas targeted by this assay, and inadequate number of viral copies(<138 copies/mL). A negative result must be combined with clinical observations, patient history,  and epidemiological information. The expected result is Negative.  Fact Sheet for Patients:  EntrepreneurPulse.com.au  Fact Sheet for Healthcare Providers:  IncredibleEmployment.be  This test is no t yet approved or cleared by the [place] FDA and  has been authorized for detection and/or diagnosis of SARS-CoV-2 by FDA under an Emergency Use Authorization (EUA). This EUA will remain  in effect (meaning this test can be used) for the duration of the COVID-19 declaration under Section 564(b)(1) of the Act, 21 U.S.C.section 360bbb-3(b)(1), unless the authorization is terminated  or revoked sooner.       Influenza A by PCR NEGATIVE NEGATIVE Final   Influenza B by PCR NEGATIVE NEGATIVE Final    Comment: (NOTE) The Xpert Xpress SARS-CoV-2/FLU/RSV plus assay is intended as an aid in the diagnosis of influenza from Nasopharyngeal swab specimens and  should not be used as a sole basis for treatment. Nasal washings and aspirates are unacceptable for Xpert Xpress SARS-CoV-2/FLU/RSV testing.  Fact Sheet for Patients: EntrepreneurPulse.com.au  Fact Sheet for Healthcare Providers: IncredibleEmployment.be  This test is not yet approved or cleared by the [place] FDA and has been authorized for detection and/or diagnosis of SARS-CoV-2 by FDA under an Emergency Use Authorization (EUA). This EUA will remain in effect (meaning this test can be used) for the duration of the COVID-19 declaration under Section 564(b)(1) of the Act, 21 U.S.C. section 360bbb-3(b)(1), unless the authorization is terminated or revoked.  Performed at [facility], [address], [place], [place] [address]   Urine Culture     Status: None (Preliminary result)   Collection Time: 09/19/21  1:35 AM   Specimen: In/Out Cath Urine  Result Value Ref Range Status   Specimen Description   Final    IN/OUT CATH URINE Performed at [facility], [address] [place], [place] [address]    Special Requests   Final    NONE Performed at [facility], [address] [place], [place] [address]    Culture   Final    CULTURE REINCUBATED FOR BETTER GROWTH Performed at [facility], [place] [address], [place], [place] [address]    Report Status PENDING  Incomplete  Culture, blood (Routine x 2)     Status: None (Preliminary result)   Collection Time: 09/19/21  1:42 AM   Specimen: BLOOD RIGHT FOREARM  Result Value Ref Range Status   Specimen Description   Final    BLOOD RIGHT FOREARM BOTTLES DRAWN AEROBIC AND ANAEROBIC   Special Requests Blood Culture adequate volume  Final   Culture   Final    NO GROWTH < 24 HOURS Performed at [facility], [address], [place], [place] [address]    Report Status PENDING  Incomplete    Radiology Reports No results found.  SIGNED: Deatra James, MD, FHM. [facility],  Pager (please use [email or web address] to page/text) Please use Epic Secure Chat for non-urgent communication (7AM-7PM)  If 7PM-7AM, please contact night-coverage [email or web address], 09/20/2021, 11:28 AM

## 2021-09-20 NOTE — Assessment & Plan Note (Signed)
-  Resolved - Monitoring and repleting checking magnesium

## 2021-09-20 NOTE — Assessment & Plan Note (Signed)
Body mass index is 17.17 kg/m. -Due to chronic illness -Dietitian consulted Continue dietary supplements

## 2021-09-21 ENCOUNTER — Encounter (HOSPITAL_BASED_OUTPATIENT_CLINIC_OR_DEPARTMENT_OTHER): Payer: Medicare Other | Admitting: Internal Medicine

## 2021-09-21 DIAGNOSIS — R5381 Other malaise: Secondary | ICD-10-CM | POA: Diagnosis not present

## 2021-09-21 DIAGNOSIS — I10 Essential (primary) hypertension: Secondary | ICD-10-CM | POA: Diagnosis not present

## 2021-09-21 DIAGNOSIS — R627 Adult failure to thrive: Secondary | ICD-10-CM | POA: Diagnosis not present

## 2021-09-21 DIAGNOSIS — G6281 Critical illness polyneuropathy: Secondary | ICD-10-CM

## 2021-09-21 DIAGNOSIS — M869 Osteomyelitis, unspecified: Secondary | ICD-10-CM | POA: Diagnosis not present

## 2021-09-21 DIAGNOSIS — G35 Multiple sclerosis: Secondary | ICD-10-CM | POA: Diagnosis not present

## 2021-09-21 DIAGNOSIS — A021 Salmonella sepsis: Secondary | ICD-10-CM

## 2021-09-21 DIAGNOSIS — A419 Sepsis, unspecified organism: Secondary | ICD-10-CM | POA: Diagnosis not present

## 2021-09-21 DIAGNOSIS — J449 Chronic obstructive pulmonary disease, unspecified: Secondary | ICD-10-CM | POA: Diagnosis not present

## 2021-09-21 DIAGNOSIS — M4628 Osteomyelitis of vertebra, sacral and sacrococcygeal region: Secondary | ICD-10-CM

## 2021-09-21 DIAGNOSIS — R6520 Severe sepsis without septic shock: Secondary | ICD-10-CM

## 2021-09-21 LAB — CBC
HCT: 33.3 % — ABNORMAL LOW (ref 36.0–46.0)
Hemoglobin: 10.7 g/dL — ABNORMAL LOW (ref 12.0–15.0)
MCH: 28.2 pg (ref 26.0–34.0)
MCHC: 32.1 g/dL (ref 30.0–36.0)
MCV: 87.6 fL (ref 80.0–100.0)
Platelets: 250 10*3/uL (ref 150–400)
RBC: 3.8 MIL/uL — ABNORMAL LOW (ref 3.87–5.11)
RDW: 15.7 % — ABNORMAL HIGH (ref 11.5–15.5)
WBC: 8.6 10*3/uL (ref 4.0–10.5)
nRBC: 0 % (ref 0.0–0.2)

## 2021-09-21 LAB — TYPE AND SCREEN
ABO/RH(D): O POS
Antibody Screen: NEGATIVE
Unit division: 0
Unit division: 0

## 2021-09-21 LAB — BPAM RBC
Blood Product Expiration Date: 202308252359
Blood Product Expiration Date: 202308252359
ISSUE DATE / TIME: 202307231312
ISSUE DATE / TIME: 202307231633
Unit Type and Rh: 5100
Unit Type and Rh: 5100

## 2021-09-21 LAB — BASIC METABOLIC PANEL
Anion gap: 8 (ref 5–15)
BUN: 9 mg/dL (ref 8–23)
CO2: 27 mmol/L (ref 22–32)
Calcium: 8.6 mg/dL — ABNORMAL LOW (ref 8.9–10.3)
Chloride: 105 mmol/L (ref 98–111)
Creatinine, Ser: 0.5 mg/dL (ref 0.44–1.00)
GFR, Estimated: >60 mL/min (ref >60)
Glucose, Bld: 82 mg/dL (ref 70–99)
Potassium: 3.8 mmol/L (ref 3.5–5.1)
Sodium: 140 mmol/L (ref 135–145)

## 2021-09-21 MED ORDER — DOXYCYCLINE HYCLATE 100 MG PO TABS
100.0000 mg | ORAL_TABLET | Freq: Two times a day (BID) | ORAL | Status: DC
Start: 2021-09-21 — End: 2021-09-22
  Administered 2021-09-21 – 2021-09-22 (×2): 100 mg via ORAL
  Filled 2021-09-21 (×2): qty 1

## 2021-09-21 MED ORDER — CEFDINIR 300 MG PO CAPS
300.0000 mg | ORAL_CAPSULE | Freq: Two times a day (BID) | ORAL | Status: DC
  Administered 2021-09-21 – 2021-09-22 (×2): 300 mg via ORAL
  Filled 2021-09-21 (×2): qty 1

## 2021-09-21 NOTE — Evaluation (Signed)
Occupational Therapy Evaluation Patient Details Name: Faith COMMON MRN: [id number] DOB: January 01, 1956 Today's Date: 09/21/2021   History of Present Illness Faith [name] is a 66 y.o. female with medical history significant of COPD, GERD, hypertension, hyperlipidemia, MS, and more presents ED with a chief complaint of sacral wound.  Patient reports wound started 1 year ago.  She came in today because she has been more fatigued and the pain has been worse.  She describes the pain in the wound as pressure and sharp.  It is worse with positional changes.  It is better when she takes her hydrocodone at home.  She denies fever.  She admits to yellow-green drainage from the wound.  Patient reports this drainage is malodorous.  Orts no dysuria, does report malodorous urine.  She has had no hematuria.  Although patient visibly appears short of breath she reports no shortness of breath, chest pain, palpitations.  Patient reports no nausea vomiting or abdominal pain.  She does report she has had a decrease in appetite.   Clinical Impression   Pt agreeable to OT evaluation today, reports she feels soaked and would like to change. Pt performing mobility tasks with min/mod assist, transferring to chair and BSC today. No dizziness or lightheaded feelings today. Pt requiring increased time and set-up for seated ADLs, OT providing additional assistance for standing tasks such as doffing soiled underwear and sponge bathing. Pt will benefit from continued skilled OT services during admission, recommend [facility] OT services on discharge to improve ability to participate in ADLs and mobility tasks safely.       Recommendations for follow up therapy are one component of a multi-disciplinary discharge planning process, led by the attending physician.  Recommendations may be updated based on patient status, additional functional criteria and insurance authorization.   Follow Up Recommendations  Home health OT    Assistance  Recommended at Discharge Frequent or constant Supervision/Assistance  Patient can return home with the following A lot of help with walking and/or transfers;A lot of help with bathing/dressing/bathroom;Assistance with cooking/housework;Assistance with feeding;Assist for transportation;Help with stairs or ramp for entrance    Functional Status Assessment  Patient has had a recent decline in their functional status and demonstrates the ability to make significant improvements in function in a reasonable and predictable amount of time.  Equipment Recommendations  None recommended by OT    Recommendations for Other Services       Precautions / Restrictions Precautions Precautions: Fall Restrictions Weight Bearing Restrictions: No      Mobility Bed Mobility Overal bed mobility: Needs Assistance Bed Mobility: Supine to Sit     Supine to sit: HOB elevated, Min assist          Transfers Overall transfer level: Needs assistance Equipment used: Rolling walker (2 wheels) Transfers: Sit to/from Stand, Bed to chair/wheelchair/BSC Sit to Stand: Mod assist Stand pivot transfers: Min assist                    ADL either performed or assessed with clinical judgement   ADL Overall ADL's : Needs assistance/impaired Eating/Feeding: Set up;Sitting Eating/Feeding Details (indicate cue type and reason): Assist for opening containers, cutting foods Grooming: Wash/dry hands;Wash/dry face;Set up;Sitting Grooming Details (indicate cue type and reason): Pt provided with necessary items and completing task independently     Lower Body Bathing: Moderate assistance;Sitting/lateral leans;Sit to/from stand Lower Body Bathing Details (indicate cue type and reason): Upon standing OT assisting with sponge bathing backs of legs and  buttocks area Upper Body Dressing : Minimal assistance;Sitting Upper Body Dressing Details (indicate cue type and reason): Independent with doffing gown, min assist  to donn clean gown to manage ties and buttons Lower Body Dressing: Moderate assistance;Sitting/lateral leans;Sit to/from stand Lower Body Dressing Details (indicate cue type and reason): OT assisting with doffing mesh underwear while pt standing at RW. OT providing assist for donning socks at bed level Toilet Transfer: Minimal assistance;Stand-pivot;BSC/3in1;Rolling walker (2 wheels) Toilet Transfer Details (indicate cue type and reason): Pt transferring to/from [facility] with increased time, min assist and RW Toileting- Clothing Manipulation and Hygiene: Set up;Sitting/lateral lean Toileting - Clothing Manipulation Details (indicate cue type and reason): pt performing hygiene task with set-up             Vision Baseline Vision/History: 0 No visual deficits Ability to See in Adequate Light: 0 Adequate Patient Visual Report: No change from baseline Vision Assessment?: No apparent visual deficits            Pertinent Vitals/Pain Pain Assessment Pain Assessment: 0-10 Pain Score: 4  Pain Location: sacral area Pain Descriptors / Indicators: Aching, Discomfort, Grimacing Pain Intervention(s): Limited activity within patient's tolerance, Monitored during session, Repositioned     Hand Dominance Right   Extremity/Trunk Assessment Upper Extremity Assessment Upper Extremity Assessment: Generalized weakness   Lower Extremity Assessment Lower Extremity Assessment: Defer to PT evaluation   Cervical / Trunk Assessment Cervical / Trunk Assessment: Normal   Communication Communication Communication: No difficulties   Cognition Arousal/Alertness: Awake/alert Behavior During Therapy: WFL for tasks assessed/performed Overall Cognitive Status: Within Functional Limits for tasks assessed                                                  Home Living Family/patient expects to be discharged to:: Private residence Living Arrangements: Children Available Help at Discharge:  Family;Personal care attendant;Available PRN/intermittently Type of Home: House Home Access: Ramped entrance     Home Layout: One level     Bathroom Shower/Tub: Sponge bathes at baseline   Bathroom Toilet: Standard     Home Equipment: Conservation officer, nature (2 wheels);Rollator (4 wheels);BSC/3in1;Wheelchair - power;Adaptive equipment   Additional Comments: [facility] going to [facility] 3x[facility] for therapies and ADL assist; has daughter-in-law visit the other 2x/wk for sacral wound care. Lives with son who "is there almost all the time"; did have a CNA but she has not been coming for the past 2 months; trying to find her another      Prior Functioning/Environment Prior Level of Function : Needs assist             Mobility Comments: Typically mod indep with standing/step pivot transfers to/from electric scooter with rollator (prefers use of rollator with brakes locked). Sleeps in recliner, sometimes using a donut pillow due to sacral wound; reports frequent repositioning for pressure relief ADLs Comments: needs assist for sponge bath; usually CNA assisted 2 x a week and had a shower at [facility] 1 x a week        OT Problem List: Decreased strength;Decreased activity tolerance;Impaired balance (sitting and/or standing);Decreased coordination;Decreased knowledge of use of DME or AE;Impaired UE functional use;Pain      OT Treatment/Interventions: Self-care/ADL training;Therapeutic exercise;DME and/or AE instruction;Therapeutic activities;Patient/family education    OT Goals(Current goals can be found in the care plan section) Acute Rehab OT Goals Patient Stated Goal: To  get stronger and go home OT Goal Formulation: With patient Time For Goal Achievement: 10/05/21 Potential to Achieve Goals: Good  OT Frequency: Min 1X/week       AM-PAC OT "6 Clicks" Daily Activity     Outcome Measure Help from another person eating meals?: A Little Help from another person taking care of personal grooming?: A  Little Help from another person toileting, which includes using toliet, bedpan, or urinal?: A Little Help from another person bathing (including washing, rinsing, drying)?: A Lot Help from another person to put on and taking off regular upper body clothing?: A Little Help from another person to put on and taking off regular lower body clothing?: A Lot 6 Click Score: 16   End of Session Equipment Utilized During Treatment: Gait belt;Rolling walker (2 wheels) Nurse Communication: Other (comment) (pt called for nsg to come change purewick)  Activity Tolerance: Patient tolerated treatment well Patient left: in chair;with call bell/phone within reach  OT Visit Diagnosis: Unsteadiness on feet (R26.81);Muscle weakness (generalized) (M62.81);History of falling (Z91.81)                Time: 9458-5929 OT Time Calculation (min): 38 min Charges:  OT General Charges $OT Visit: 1 Visit OT Evaluation $OT Eval Low Complexity: 1 Low OT Treatments $Self Care/Home Management : 8-22 mins    Guadelupe Sabin, OTR/L  [phone] 09/21/2021, 9:50 AM

## 2021-09-21 NOTE — TOC Initial Note (Signed)
Transition of Care [facility]) - Initial/Assessment Note    Patient Details  Name: Faith [name] MRN: [id number] Date of Birth: [birth year]  Transition of Care [facility]) CM/SW Contact:    Faith Gully, LCSW Phone Number: 09/21/2021, 1:42 PM  Clinical Narrative:                 Patient from home with son. Has DME. Uses motorized wc. Active with [facility], goes 3x/week. Recommended for SNF. Declines SNF. Wants HH. Due to being active with [facility] she can get rehab in [facility] when she is there 3x/week. May need IV abx at d/c. Faith [name] with [facility] states that patient frequently declines services that are recommended that her family wants her to have. They are trying to get her a new aide because patient did not want her last aide to return. Faith [name], daughter, states that she and her brother want patient to go to SNF. Discussed that patient would have to agree to SNF. Daughter states that they have been trying to get patient in SNF for two years and she continues to refuse.  Daughter and son to speak with patient again in an attempt to get her to reconsider her decision.   Expected Discharge Plan: [place] Barriers to Discharge: Continued Medical Work up   Patient Goals and CMS Choice Patient states their goals for this hospitalization and ongoing recovery are:: patient wants to go home with [name] services      Expected Discharge Plan and Services Expected Discharge Plan: [place] Acute Care Choice: Durable Medical Equipment Living arrangements for the past 2 months: Single Family Home                                      Prior Living Arrangements/Services Living arrangements for the past 2 months: Single Family Home Lives with:: Adult Children Patient language and need for interpreter reviewed:: Yes        Need for Family Participation in Patient Care: Yes (Comment) Care giver support system in place?: Yes (comment) Current home services: DME, Other  (comment) ([facility]) Criminal Activity/Legal Involvement Pertinent to Current Situation/Hospitalization: No - Comment as needed  Activities of Daily Living Home Assistive Devices/Equipment: Bedside commode/3-in-1, Wheelchair, Hospital bed ADL Screening (condition at time of admission) Patient's cognitive ability adequate to safely complete daily activities?: Yes Is the patient deaf or have difficulty hearing?: No Does the patient have difficulty seeing, even when wearing glasses/contacts?: No Does the patient have difficulty concentrating, remembering, or making decisions?: No Patient able to express need for assistance with ADLs?: Yes Does the patient have difficulty dressing or bathing?: Yes Independently performs ADLs?: No Communication: Independent Dressing (OT): Needs assistance Is this a change from baseline?: Pre-admission baseline Grooming: Needs assistance Is this a change from baseline?: Pre-admission baseline Feeding: Independent Bathing: Needs assistance Is this a change from baseline?: Pre-admission baseline Toileting: Needs assistance Is this a change from baseline?: Pre-admission baseline In/Out Bed: Needs assistance Is this a change from baseline?: Pre-admission baseline Walks in Home: Needs assistance Is this a change from baseline?: Pre-admission baseline Does the patient have difficulty walking or climbing stairs?: Yes Weakness of Legs: Both Weakness of Arms/Hands: Both  Permission Sought/Granted Permission sought to share information with : Family Supports, Customer service manager    Share Information with NAME: Faith [name], daughter and Faith [name], [place]  Education officer, museum           Emotional Assessment Appearance:: Appears stated age   Affect (typically observed): Appropriate Orientation: : Oriented to Self, Oriented to Place, Oriented to  Time, Oriented to Situation Alcohol / Substance Use: Not Applicable Psych Involvement: No  (comment)  Admission diagnosis:  Osteomyelitis ([place]) [M86.9] 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] Sepsis, due to unspecified organism, unspecified whether acute organ dysfunction present [facility]) [A41.9] Patient Active Problem List   Diagnosis Date Noted   Iron deficiency anemia due to chronic blood loss 09/20/2021    Class: Chronic   Osteomyelitis ([place]) 09/19/2021   Sepsis ([place]) 09/19/2021   UTI (urinary tract infection) 09/19/2021   High risk medication use 04/28/2021   Gait disturbance 04/28/2021   Dysesthesia 04/28/2021   Diplegia of both lower extremities ([place]) 04/28/2021   GERD (gastroesophageal reflux disease) 04/12/2021   Exacerbation of multiple sclerosis ([place]) 04/11/2021   Leukocytosis 04/11/2021   COVID-19 virus infection 02/15/2021   Dehydration 02/15/2021   Failure to thrive in adult 02/15/2021   Hypokalemia 02/15/2021   Lactic acidosis 02/15/2021   Debility 02/15/2021   Pressure injury of skin 02/15/2021   Acute osteomyelitis ([place]) 12/10/2020   Protein-calorie malnutrition, moderate ([place]) 03/29/2020   Sacral decubitus ulcer, stage IV ([place])    Acute appendicitis    Decubital ulcer 03/13/2020   SIRS (systemic inflammatory response syndrome) ([place]) 03/13/2020   Bilateral leg weakness 03/01/2020   COPD (chronic obstructive pulmonary disease) ([place]) 03/01/2020   Essential hypertension 03/01/2020   Pressure injury of right buttock, stage 2 ([place]) 02/08/2020   PVD (peripheral vascular disease) ([place]) 11/28/2019   Mixed hyperlipidemia 11/28/2019   Wheelchair dependence 09/24/2019   Cervical myelopathy ([place]) 01/22/2019   Spinal cord compression due to degenerative disorder of spinal column 01/20/2019   Synovial cyst    Spondylolisthesis, cervical region 01/19/2019   Chronic back pain 01/18/2019   Multiple sclerosis exacerbation ([place]) 01/17/2019   Erythrocytosis 09/13/2018   Vitamin D deficiency 03/09/2016   Multiple sclerosis ([place]) 02/24/2016   Tobacco use 12/11/2015    Aortic atherosclerosis ([place]) 12/11/2015   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   Falls frequently 12/11/2015   Tremor 12/11/2015   Other emphysema ([place]) 12/11/2015   PCP:  Janifer Adie, MD Pharmacy:   [facility], [place] [address] Phone: [phone] Fax: [place], [place] [address] Phone: [phone] Fax: [phone]     Social Determinants of Health (SDOH) Interventions    Readmission Risk Interventions    02/16/2021   11:32 AM 12/12/2020   12:47 PM 03/31/2020    1:03 PM  Readmission Risk Prevention Plan  Post Dischage Appt  Complete Complete  Medication Screening  Complete Complete  Transportation Screening Complete Complete Complete  Home Care Screening Complete    Medication Review (RN CM) Complete

## 2021-09-21 NOTE — Progress Notes (Signed)
Wound care and ID notes reviewed.  We will check wound tomorrow in a.m.

## 2021-09-21 NOTE — NC FL2 (Signed)
[place] LEVEL OF CARE SCREENING TOOL     IDENTIFICATION  Patient Name: Faith [name] Birthdate: 02/12/1956 Sex: female Admission Date (Current Location): 09/19/2021  [facility] and [place] Number:  [employer] and Address:  [place] [address] [place]      Provider Number: [id number]  Attending Physician Name and Address:  Deatra James, MD  Relative Name and Phone Number:       Current Level of Care: Hospital Recommended Level of Care: [place] Prior Approval Number:    Date Approved/Denied:   PASRR Number: [id number]  Discharge Plan: SNF    Current Diagnoses: Patient Active Problem List   Diagnosis Date Noted   Iron deficiency anemia due to chronic blood loss 09/20/2021   Osteomyelitis ([place]) 09/19/2021   Sepsis ([place]) 09/19/2021   UTI (urinary tract infection) 09/19/2021   High risk medication use 04/28/2021   Gait disturbance 04/28/2021   Dysesthesia 04/28/2021   Diplegia of both lower extremities ([place]) 04/28/2021   GERD (gastroesophageal reflux disease) 04/12/2021   Exacerbation of multiple sclerosis ([place]) 04/11/2021   Leukocytosis 04/11/2021   COVID-19 virus infection 02/15/2021   Dehydration 02/15/2021   Failure to thrive in adult 02/15/2021   Hypokalemia 02/15/2021   Lactic acidosis 02/15/2021   Debility 02/15/2021   Pressure injury of skin 02/15/2021   Acute osteomyelitis ([place]) 12/10/2020   Protein-calorie malnutrition, moderate ([place]) 03/29/2020   Sacral decubitus ulcer, stage IV ([facility])    Acute appendicitis    Decubital ulcer 03/13/2020   SIRS (systemic inflammatory response syndrome) ([place]) 03/13/2020   Bilateral leg weakness 03/01/2020   COPD (chronic obstructive pulmonary disease) ([place]) 03/01/2020   Essential hypertension 03/01/2020   Pressure injury of right buttock, stage 2 ([place]) 02/08/2020   PVD (peripheral vascular disease) ([place]) 11/28/2019   Mixed hyperlipidemia  11/28/2019   Wheelchair dependence 09/24/2019   Cervical myelopathy ([place]) 01/22/2019   Spinal cord compression due to degenerative disorder of spinal column 01/20/2019   Synovial cyst    Spondylolisthesis, cervical region 01/19/2019   Chronic back pain 01/18/2019   Multiple sclerosis exacerbation ([place]) 01/17/2019   Erythrocytosis 09/13/2018   Vitamin D deficiency 03/09/2016   Multiple sclerosis ([place]) 02/24/2016   Tobacco use 12/11/2015   Aortic atherosclerosis ([place]) 12/11/2015   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   Falls frequently 12/11/2015   Tremor 12/11/2015   Other emphysema ([place]) 12/11/2015    Orientation RESPIRATION BLADDER Height & Weight     Self, Time, Place, Situation  Normal Continent Weight: 100 lb (45.4 kg) Height:  '5\' 4"'$  (162.6 cm)  BEHAVIORAL SYMPTOMS/MOOD NEUROLOGICAL BOWEL NUTRITION STATUS      Continent Diet (heart healthy)  AMBULATORY STATUS COMMUNICATION OF NEEDS Skin   Limited Assist Verbally PU Stage and Appropriate Care (Stage 4: sacrum, posterior)                       Personal Care Assistance Level of Assistance  Bathing, Feeding, Dressing Bathing Assistance: Limited assistance Feeding assistance: Independent Dressing Assistance: Limited assistance     Functional Limitations Info  Sight, Hearing, Speech Sight Info: Adequate Hearing Info: Adequate Speech Info: Adequate    SPECIAL CARE FACTORS FREQUENCY  PT (By licensed PT), OT (By licensed OT)     PT Frequency: 5x/week OT Frequency: 3x/week            Contractures Contractures Info: Not present  Additional Factors Info  Code Status, Allergies Code Status Info: Full Code Allergies Info: Darvon, Penicillins           Current Medications (09/21/2021):  This is the current hospital active medication list Current Facility-Administered Medications  Medication Dose Route Frequency Provider Last Rate Last Admin   albuterol  (PROVENTIL) (2.5 MG/3ML) 0.083% nebulizer solution 2.5 mg  2.5 mg Nebulization Q4H PRN Zierle-Ghosh, Asia B, DO       ascorbic acid (VITAMIN C) tablet 500 mg  500 mg Oral Daily Zierle-Ghosh, Asia B, DO   500 mg at 09/21/21 0839   atorvastatin (LIPITOR) tablet 20 mg  20 mg Oral Daily Zierle-Ghosh, Asia B, DO   20 mg at 09/21/21 0838   baclofen (LIORESAL) tablet 10 mg  10 mg Oral TID Zierle-Ghosh, Asia B, DO   10 mg at 09/21/21 0838   ceFEPIme (MAXIPIME) 2 g in sodium chloride 0.9 % 100 mL IVPB  2 g Intravenous Q12H Laren Everts, RPH   Stopped at 09/21/21 9449   diazepam (VALIUM) tablet 5 mg  5 mg Oral Q6H PRN Zierle-Ghosh, Asia B, DO   5 mg at 09/21/21 0838   feeding supplement (ENSURE ENLIVE / ENSURE PLUS) liquid 237 mL  237 mL Oral TID BM Zierle-Ghosh, Asia B, DO   237 mL at 09/21/21 1341   ferric gluconate (FERRLECIT) 250 mg in sodium chloride 0.9 % 250 mL IVPB  250 mg Intravenous Daily Skipper Cliche A, MD 135 mL/hr at 09/21/21 0954 250 mg at 09/21/21 0954   gabapentin (NEURONTIN) capsule 600 mg  600 mg Oral TID Zierle-Ghosh, Asia B, DO   600 mg at 09/21/21 0838   HYDROcodone-acetaminophen (NORCO/VICODIN) 5-325 MG per tablet 1 tablet  1 tablet Oral Q6H PRN Zierle-Ghosh, Asia B, DO   1 tablet at 09/21/21 1341   lactated ringers infusion   Intravenous Continuous Zierle-Ghosh, Asia B, DO 125 mL/hr at 09/21/21 0640 Infusion Verify at 09/21/21 0640   polyethylene glycol (MIRALAX / GLYCOLAX) packet 17 g  17 g Oral Daily Zierle-Ghosh, Asia B, DO   17 g at 09/21/21 0839   sodium hypochlorite (DAKIN'S 1/4 STRENGTH) topical solution   Irrigation BID Aviva Signs, MD   Given at 09/21/21 0839   vancomycin (VANCOREADY) IVPB 750 mg/150 mL  750 mg Intravenous Q24H Laren Everts, RPH   Stopped at 09/21/21 0310   zinc sulfate capsule 220 mg  220 mg Oral Daily Zierle-Ghosh, Asia B, DO   220 mg at 09/21/21 6759     Discharge Medications: Please see discharge summary for a list of discharge  medications.  Relevant Imaging Results:  Relevant Lab Results:   Additional Information SSN: [id number] Clydene Pugh, LCSW

## 2021-09-21 NOTE — Plan of Care (Signed)
  Problem: Acute Rehab OT Goals (only OT should resolve) Goal: Pt. Will Perform Grooming Flowsheets (Taken 09/21/2021 0952) Pt Will Perform Grooming:  with set-up  sitting Goal: Pt. Will Perform Upper Body Dressing Flowsheets (Taken 09/21/2021 0952) Pt Will Perform Upper Body Dressing:  with modified independence  sitting Goal: Pt. Will Perform Lower Body Dressing Flowsheets (Taken 09/21/2021 0952) Pt Will Perform Lower Body Dressing:  with min assist  sitting/lateral leans  sit to/from stand Goal: Pt. Will Transfer To Toilet Flowsheets (Taken 09/21/2021 [phone]) Pt Will Transfer to Toilet:  with supervision  stand pivot transfer  bedside commode Goal: Pt. Will Perform Toileting-Clothing Manipulation Flowsheets (Taken 09/21/2021 0952) Pt Will Perform Toileting - Clothing Manipulation and hygiene:  with supervision  sitting/lateral leans  sit to/from stand Goal: Pt/Caregiver Will Perform Home Exercise Program Flowsheets (Taken 09/21/2021 [phone]) Pt/caregiver will Perform Home Exercise Program:  Increased strength  Both right and left upper extremity  Independently  With written HEP provided

## 2021-09-21 NOTE — Progress Notes (Signed)
Virtual Visit via Video Note  I connected with Faith [name] on '@TODAY'$ @ at  by a video enabled telemedicine application and verified that I am speaking with the correct person using two identifiers.  Location: Patient: Faith [name] Provider: Home   I discussed the limitations of evaluation and management by telemedicine and the availability of in person appointments. The patient expressed understanding and agreed to proceed.               Subjective: No new complaints   Antibiotics:  Anti-infectives (From admission, onward)    Start     Dose/Rate Route Frequency Ordered Stop   09/20/21 0400  vancomycin (VANCOREADY) IVPB 750 mg/150 mL        750 mg 150 mL/hr over 60 Minutes Intravenous Every 24 hours 09/19/21 0554     09/19/21 0600  ceFEPIme (MAXIPIME) 2 g in sodium chloride 0.9 % 100 mL IVPB        2 g 200 mL/hr over 30 Minutes Intravenous Every 12 hours 09/19/21 0554     09/19/21 0415  vancomycin (VANCOCIN) IVPB 1000 mg/200 mL premix        1,000 mg 200 mL/hr over 60 Minutes Intravenous  Once 09/19/21 0403 09/19/21 0554   09/19/21 0145  cefTRIAXone (ROCEPHIN) 2 g in sodium chloride 0.9 % 100 mL IVPB        2 g 200 mL/hr over 30 Minutes Intravenous  Once 09/19/21 0143 09/19/21 0316       Medications: Scheduled Meds:  ascorbic acid  500 mg Oral Daily   atorvastatin  20 mg Oral Daily   baclofen  10 mg Oral TID   feeding supplement  237 mL Oral TID BM   gabapentin  600 mg Oral TID   polyethylene glycol  17 g Oral Daily   sodium hypochlorite   Irrigation BID   zinc sulfate  220 mg Oral Daily   Continuous Infusions:  ceFEPime (MAXIPIME) IV Stopped (09/21/21 1025)   ferric gluconate (FERRLECIT) IVPB 250 mg (09/21/21 0954)   lactated ringers 125 mL/hr at 09/21/21 0640   vancomycin Stopped (09/21/21 0310)   PRN Meds:.albuterol, diazepam, HYDROcodone-acetaminophen    Objective: Weight change:   Intake/Output Summary (Last 24 hours) at 09/21/2021  1442 Last data filed at 09/21/2021 0800 Gross per 24 hour  Intake 4658.85 ml  Output 1850 ml  Net 2808.85 ml   Blood pressure 127/77, pulse 75, temperature 98.3 F (36.8 C), resp. rate 20, height '5\' 4"'$  (1.626 m), weight 45.4 kg, SpO2 100 %. Temp:  [98.3 F (36.8 C)-98.6 F (37 C)] 98.3 F (36.8 C) (07/23 2119) Pulse Rate:  [62-75] 75 (07/23 2119) Resp:  [15-20] 20 (07/23 2119) BP: (127-137)/(77-84) 127/77 (07/23 2119) SpO2:  [98 %-100 %] 100 % (07/23 2119)  Physical Exam: Physical Exam   CBC:    BMET Recent Labs    09/20/21 0632 09/21/21 0623  NA 139 140  K 3.3* 3.8  CL 109 105  CO2 25 27  GLUCOSE 94 82  BUN 12 9  CREATININE 0.53 0.50  CALCIUM 8.2* 8.6*     Liver Panel  Recent Labs    09/19/21 0135  PROT 6.9  ALBUMIN 3.0*  AST 15  ALT 8  ALKPHOS 69  BILITOT 0.3       Sedimentation Rate No results for input(s): "ESRSEDRATE" in the last 72 hours. C-Reactive Protein No results for input(s): "CRP" in the last 72 hours.  Micro Results: Recent Results (from the  past 720 hour(s))  Culture, blood (Routine x 2)     Status: None (Preliminary result)   Collection Time: 09/19/21  1:35 AM   Specimen: BLOOD RIGHT FOREARM  Result Value Ref Range Status   Specimen Description   Final    BLOOD RIGHT FOREARM BOTTLES DRAWN AEROBIC AND ANAEROBIC   Special Requests Blood Culture adequate volume  Final   Culture   Final    NO GROWTH 2 DAYS Performed at [facility], [address], [place], [place] [address]    Report Status PENDING  Incomplete  Resp Panel by RT-PCR (Flu A&B, Covid) Anterior Nasal Swab     Status: None   Collection Time: 09/19/21  1:35 AM   Specimen: Anterior Nasal Swab  Result Value Ref Range Status   SARS Coronavirus 2 by RT PCR NEGATIVE NEGATIVE Final    Comment: (NOTE) SARS-CoV-2 target nucleic acids are NOT DETECTED.  The SARS-CoV-2 RNA is generally detectable in upper respiratory specimens during the acute phase of infection. The  lowest concentration of SARS-CoV-2 viral copies this assay can detect is 138 copies/mL. A negative result does not preclude SARS-Cov-2 infection and should not be used as the sole basis for treatment or other patient management decisions. A negative result may occur with  improper specimen collection/handling, submission of specimen other than nasopharyngeal swab, presence of viral mutation(s) within the areas targeted by this assay, and inadequate number of viral copies(<138 copies/mL). A negative result must be combined with clinical observations, patient history, and epidemiological information. The expected result is Negative.  Fact Sheet for Patients:  EntrepreneurPulse.com.au  Fact Sheet for Healthcare Providers:  IncredibleEmployment.be  This test is no t yet approved or cleared by the [place] FDA and  has been authorized for detection and/or diagnosis of SARS-CoV-2 by FDA under an Emergency Use Authorization (EUA). This EUA will remain  in effect (meaning this test can be used) for the duration of the COVID-19 declaration under Section 564(b)(1) of the Act, 21 U.S.C.section 360bbb-3(b)(1), unless the authorization is terminated  or revoked sooner.       Influenza A by PCR NEGATIVE NEGATIVE Final   Influenza B by PCR NEGATIVE NEGATIVE Final    Comment: (NOTE) The Xpert Xpress SARS-CoV-2/FLU/RSV plus assay is intended as an aid in the diagnosis of influenza from Nasopharyngeal swab specimens and should not be used as a sole basis for treatment. Nasal washings and aspirates are unacceptable for Xpert Xpress SARS-CoV-2/FLU/RSV testing.  Fact Sheet for Patients: EntrepreneurPulse.com.au  Fact Sheet for Healthcare Providers: IncredibleEmployment.be  This test is not yet approved or cleared by the [place] FDA and has been authorized for detection and/or diagnosis of SARS-CoV-2 by FDA under  an Emergency Use Authorization (EUA). This EUA will remain in effect (meaning this test can be used) for the duration of the COVID-19 declaration under Section 564(b)(1) of the Act, 21 U.S.C. section 360bbb-3(b)(1), unless the authorization is terminated or revoked.  Performed at [facility], [address] [place] [place], [place] [address]   Urine Culture     Status: None (Preliminary result)   Collection Time: 09/19/21  1:35 AM   Specimen: In/Out Cath Urine  Result Value Ref Range Status   Specimen Description   Final    IN/OUT CATH URINE Performed at [facility], [address] [place], [place] [address]    Special Requests   Final    NONE Performed at [facility], [address], [place],  [address]    Culture  Final    CULTURE REINCUBATED FOR BETTER GROWTH Performed at [facility], [place] [address] [place], [place] [address]    Report Status PENDING  Incomplete  Culture, blood (Routine x 2)     Status: None (Preliminary result)   Collection Time: 09/19/21  1:42 AM   Specimen: BLOOD RIGHT FOREARM  Result Value Ref Range Status   Specimen Description   Final    BLOOD RIGHT FOREARM BOTTLES DRAWN AEROBIC AND ANAEROBIC   Special Requests Blood Culture adequate volume  Final   Culture   Final    NO GROWTH 2 DAYS Performed at [facility], [address] [place], [place] [address]    Report Status PENDING  Incomplete    Studies/Results: No results found.    Assessment/Plan:  INTERVAL HISTORY: Blood cultures remain sterile   Principal Problem:   Sepsis ([place]) Active Problems:   Multiple sclerosis (HCC)   COPD (chronic obstructive pulmonary disease) (HCC)   Essential hypertension   Sacral decubitus ulcer, stage IV (HCC)   Protein-calorie malnutrition, moderate (HCC)   Failure to thrive in adult   Hypokalemia   Debility   Osteomyelitis (HCC)   UTI (urinary tract infection)   Iron deficiency anemia due to chronic blood loss    Faith [name] is a 66  y.o. female with history of multiple sclerosis bedbound JC virus who was admitted to the hospital fever tachycardia hypotension.  Blood cultures were taken which failed to reveal any organism.  ET scan showed some mild amount of cortical structure and which was equivocal for acute osteomyelitis.  She is stabilized and remained on vancomycin and cefepime.  She has been seen by surgery who did not feel that her deep decubitus ulcers are the source of her infection.  I had discussion with her about IV versus oral antibiotics.  I will discuss case with her hospitalist.  Might be easier and safer to have her go home on oral therapy rather than have a PICC line placed.  She does have a family brother who lives with her apparently and she claims to be turning herself.  I spent 36  minutes with the patient including than 50% of the time in face to face counseling of the patient video feed regarding possible osteomyelitis or admission with sepsis, personally reviewing T scan along with review of medical records in preparation for the visit and during the visit and in coordination of her care.    LOS: 2 days   Alcide Evener 09/21/2021, 2:42 PM    Rhina [place] 09/21/2021, 2:42 PM

## 2021-09-21 NOTE — Progress Notes (Signed)
Initial Nutrition Assessment  DOCUMENTATION CODES:   Underweight  INTERVENTION:  Liberalize diet to allow greater variety   Ensure Enlive po BID   Vitamin C and Zinc  NUTRITION DIAGNOSIS:   Increased nutrient needs related to wound healing as evidenced by estimated needs.   GOAL:  Patient will meet greater than or equal to 90% of their needs   MONITOR:  Supplement acceptance, Labs, PO intake, Weight trends, Skin  REASON FOR ASSESSMENT:   Malnutrition Screening Tool    ASSESSMENT: Patient is a 66 yo female with hx of COPD, emphysema, arthritis, neuromuscular disorder (MS), HTN, GERD, anemia, FTT in an adult and stage IV decubitus. Surgery following.   Patient is up in her electric wheelchair as desired. She uses weighted utensils and arm bands to assist with managing self feeding.   Meal intakes:  7/22-0% x 4 7/23- 80%,100% 7/24- pt reports a few bites of eggs. Her lunch is here but she has not started eating.   Patient weight has been stable between 45-47 kg the past 1.5 years.   Medications: Vitamin C, Zinc, ferric gluconate      Latest Ref Rng & Units 09/21/2021    6:23 AM 09/20/2021    6:32 AM 09/19/2021    1:35 AM  BMP  Glucose 70 - 99 mg/dL 82  94  129   BUN 8 - 23 mg/dL '9  12  20   '$ Creatinine 0.44 - 1.00 mg/dL 0.50  0.53  0.64   Sodium 135 - 145 mmol/L 140  139  138   Potassium 3.5 - 5.1 mmol/L 3.8  3.3  3.8   Chloride 98 - 111 mmol/L 105  109  103   CO2 22 - 32 mmol/L '27  25  26   '$ Calcium 8.9 - 10.3 mg/dL 8.6  8.2  8.4       NUTRITION - FOCUSED PHYSICAL EXAM: Nutrition-Focused physical exam findings- minimal fat  and muscle reserves, and no  edema.     Diet Order:   Diet Order             Diet Heart Room service appropriate? Yes; Fluid consistency: Thin  Diet effective now                   EDUCATION NEEDS:  Education needs have been addressed  Skin:  Skin Assessment: Skin Integrity Issues: Skin Integrity Issues:: Stage IV Stage  IV: sacrum  Last BM:  7/22  Height:   Ht Readings from Last 1 Encounters:  09/19/21 '5\' 4"'$  (1.626 m)    Weight:   Wt Readings from Last 1 Encounters:  09/19/21 45.4 kg    Ideal Body Weight:   55 kg  BMI:  Body mass index is 17.17 kg/m.  Estimated Nutritional Needs:   Kcal:  9604-5409  Protein:  80-85gr  Fluid:  >1300 ml daily   Colman Cater MS,RD,CSG,LDN Contact: Shea Evans

## 2021-09-21 NOTE — TOC Progression Note (Signed)
Transition of Care [facility]) - Progression Note    Patient Details  Name: Faith [name] MRN: [id number] Date of Birth: 1955/07/31  [facility]) CM/SW Contact  Ihor Gully, LCSW Phone Number: 09/21/2021, 2:17 PM  Clinical Narrative:    Daughter spoke with patient. Patient is agreeable to SNF. Referred to SNFs active with [facility].    Expected Discharge Plan: [place] Barriers to Discharge: Continued Medical Work up  Expected Discharge Plan and Services Expected Discharge Plan: [place] Choice: Durable Medical Equipment Living arrangements for the past 2 months: Single Family Home                                       Social Determinants of Health (SDOH) Interventions    Readmission Risk Interventions    02/16/2021   11:32 AM 12/12/2020   12:47 PM 03/31/2020    1:03 PM  Readmission Risk Prevention Plan  Post Dischage Appt  Complete Complete  Medication Screening  Complete Complete  Transportation Screening Complete Complete Complete  Home Care Screening Complete    Medication Review (RN CM) Complete

## 2021-09-21 NOTE — Progress Notes (Signed)
When patient is discharged from hospital, [facility] will transport [facility] home, [facility] number is [phone]

## 2021-09-21 NOTE — Progress Notes (Signed)
PROGRESS NOTE    Patient: Faith [name]                            PCP: Janifer Adie, MD                    DOB: 07-14-1955            DOA: 09/19/2021 OEV:[id number]             DOS: 09/21/2021, 11:07 AM   LOS: 2 days   Date of Service: The patient was seen and examined on 09/21/2021  Subjective:     Brief Narrative:   Faith [name] is a 66 y.o. female with medical history significant of COPD, GERD, hypertension, hyperlipidemia, neuromuscular disorder- MS bedbound, presents  to ED with a chief complaint of sacral wound.   Patient reports wound started 1 year ago.  She came in today because she has been more fatigued and the pain has been worse.  She describes the pain in the wound as pressure and sharp.  It is worse with positional changes.  It is better when she takes her hydrocodone at home.  She denies fever.  She admits to yellow-green drainage from the wound.  Patient reports this drainage is malodorous.  No dysuria, does report malodorous urine.  She has had no hematuria.  Although patient visibly appears short of breath she reports no shortness of breath, chest pain, palpitations.  Patient reports no nausea vomiting or abdominal pain.  She does report she has had a decrease in appetite.  She is drinking plenty of water and Gatorade at home.  Patient has no other complaints at this time.  ED: Temp 98.4-102.9, respiratory rate 15-24, heart rate 76-93, blood pressure 94/61-108/74, satting at 92-100% Leukocytosis 12.8, hemoglobin 10.3, platelets 295 Chemistries unremarkable Albumin 3.0 UA is indicative of UTI Blood culture pending Urine culture pending CT C-spine is 3.4 mm anterior listhesis at C4 but stable, marked severity multi level degenerative changes CT head shows no acute intracranial abnormality CT pelvis shows mild slightly asymmetric urinary bladder wall thickening and acute osteomyelitis -Follow-up MRI has been recommended on CT/has been determined to be nonurgent, so  patient has been admitted to [facility]:   Principal Problem:   Sepsis ([place]) Active Problems:   Iron deficiency anemia due to chronic blood loss   Sacral decubitus ulcer, stage IV (HCC)   Osteomyelitis (HCC)   UTI (urinary tract infection)   COPD (chronic obstructive pulmonary disease) (HCC)   Essential hypertension   Protein-calorie malnutrition, moderate (HCC)   Multiple sclerosis (HCC)   Failure to thrive in adult   Hypokalemia   Debility     Assessment and Plan: * Sepsis ([place]) -Much improved -Resolved sepsis physiology  Blood pressure 117/69, pulse 75, temperature 98.6 F (37 C), resp. rate 18, height 5' 4" (1.626 m), weight 45.4 kg, SpO2 97 %.    On admission met Sepsis criteria -secondary to osteomyelitis versus UTI SIRS criteria temp 102.9, heart rate 93, respiratory rate 24, leukocytosis 12.8, with altered mental status Most likely source of infection is osteomyelitis Life threatening organ dysfunction 2/2 infection is evidenced by: Altered mental status Antibiotics started initially started on Rocephin and vancomycin, will broaden to cefepime and vancomycin Fluids given prior to admission 2 L bolus Continue LR at 125 mill per hour>>> will taper down Sepsis order set utilized  Iron deficiency  anemia due to chronic blood loss - Severe acute on chronic iron deficiency anemia Iron/TIBC/Ferritin/ %Sat    Component Value Date/Time   IRON 6 (L) 09/20/2021 0823   TIBC 186 (L) 09/20/2021 0823   FERRITIN 39 02/16/2021 0507   IRONPCTSAT 3 (L) 09/20/2021 0823      Latest Ref Rng & Units 09/21/2021    6:23 AM 09/20/2021    8:23 AM 09/19/2021    1:35 AM  CBC  WBC 4.0 - 10.5 K/uL 8.6  7.8  12.8   Hemoglobin 12.0 - 15.0 g/dL 10.7  7.2    7.2  10.3   Hematocrit 36.0 - 46.0 % 33.3  23.1    23.4  32.0   Platelets 150 - 400 K/uL 250  228  72    Was Hypotensive with severe generalized weaknesses  -s/p  2 units of PRBC transfusion on   09/20/2021  -IV iron infusion x3 days... We will switch to p.o. supplements  UTI (urinary tract infection) - UA indicative of UTI -Continue cefepime -Urine culture >>>> no growth to date  Osteomyelitis (HCC) - Osteomyelitis seen on CT scan of pelvis - We will Continue cefepime and vancomycin -Blood cultures >>> no growth to date -Plan to narrow antibiotics based on cultures -We will likely have to be discharged on extended course of antibiotics -Continue to monitor -ID consulted >>> recommended to continue cefepime/Vanco for now   Sacral decubitus ulcer, stage IV ([place]) - General surgery consulted, status post evaluation recommending no further surgical invention or debridement -Surgery does not believe that this is the source of infection and sepsis  -Associated osteomyelitis of pelvic bone -Continue cefepime and vancomycin >>> will narrow down antibiotics -Currently afebrile normotensive improved leukocytosis - Abx per sepsis -ID consulted, following closely  Protein-calorie malnutrition, moderate (HCC) - Albumin 3.0 -Continue feeding supplement -Continue to encourage nutrient dense food choices  Essential hypertension - BP soft, but stable  - Cont. To Hold BB today   COPD (chronic obstructive pulmonary disease) (HCC) - remain stable  -Remain on RA satting 100% -Current smoker -Counseled on the importance of cessation -no inhalers on home med list -albuterol PRN  -Continue to monitor  Debility - Multiple falls  - Severe debility, bedbound due to progressive MS -Continue monitoring closely -Multiple hospitalizations   -Recommend every 2 hours position change -We strongly recommend skilled nursing facility-patient currently refusing -Discussed with the care team and patient's daughter will continue to pursue SNF options   Hypokalemia - Monitoring and repleting checking magnesium   Failure to thrive in adult Body mass index is 17.17 kg/m. -Due to chronic  illness -Dietitian consulted Continue dietary supplements  Multiple sclerosis ([place]) With severe debility, bedbound -Falling, -Steadily declining -Recommending palliative care-discussed with daughter in detail   Ethics: Full code Palliative care consulted Based on above assessments, multiple comorbidities, progressive MS with steep decline, bedbound, recurrent infection, ongoing osteomyelitis concluding the patient prognosis poor we recommend palliative care... Discussed with daughter who is agreeable to plan.  ----------------------------------------------------------------------------------------------------------------------------------------------- Nutritional status:  The patient's BMI is: Body mass index is 17.17 kg/m. I agree with the assessment and plan as outlined below:  Skin Assessment: I have examined the patient's skin and I agree with the wound assessment as performed by wound care team As outlined belowe: Pressure Injury 03/13/20 Buttocks Right Unstageable - Full thickness tissue loss in which the base of the injury is covered by slough (yellow, tan, gray, green or [place]) and/or eschar (tan, [place] or black) in  the wound bed. (Active)  03/13/20 1800  Location: Buttocks  Location Orientation: Right  Staging: Unstageable - Full thickness tissue loss in which the base of the injury is covered by slough (yellow, tan, gray, green or [place]) and/or eschar (tan, [place] or black) in the wound bed.  Wound Description (Comments):   Present on Admission: Yes     Pressure Injury 02/15/21 Sacrum Medial Stage 4 - Full thickness tissue loss with exposed bone, tendon or muscle. 4cm x 4 cm x 2.5cm (Active)  02/15/21 0420  Location: Sacrum  Location Orientation: Medial  Staging: Stage 4 - Full thickness tissue loss with exposed bone, tendon or muscle.  Wound Description (Comments): 4cm x 4 cm x 2.5cm  Present on Admission: Yes     Pressure Injury 09/19/21 Sacrum Posterior Stage 4 -  Full thickness tissue loss with exposed bone, tendon or muscle. (Active)  09/19/21 1111  Location: Sacrum  Location Orientation: Posterior  Staging: Stage 4 - Full thickness tissue loss with exposed bone, tendon or muscle.  Wound Description (Comments):   Present on Admission: Yes  Dressing Type Foam - Lift dressing to assess site every shift;ABD;Dakin's-soaked gauze 09/20/21 2119     ---------------------------------------------------------------------------------------------------------------------------------------------------- Cultures; Blood Cultures x 2 >> NGT Urine Culture  >>> NGT  Wound culture >> no cultures were obtained  -----------------------------------------------------------------------------------------------------------------------------------------------  DVT prophylaxis:  SCDs Start: 09/19/21 0523   Code Status:   Code Status: Full Code  Family Communication: Daughter updated via phone call The above findings and plan of care has been discussed with patient (and family)  in detail,  they expressed understanding and agreement of above. -Advance care planning has been discussed.   Admission status:   Status is: Inpatient Remains inpatient appropriate because: Needing broad-spectrum IV antibiotics, IV fluids.  Surgical evaluation     Procedures:   No admission procedures for hospital encounter.   Antimicrobials:  Anti-infectives (From admission, onward)    Start     Dose/Rate Route Frequency Ordered Stop   09/20/21 0400  vancomycin (VANCOREADY) IVPB 750 mg/150 mL        750 mg 150 mL/hr over 60 Minutes Intravenous Every 24 hours 09/19/21 0554     09/19/21 0600  ceFEPIme (MAXIPIME) 2 g in sodium chloride 0.9 % 100 mL IVPB        2 g 200 mL/hr over 30 Minutes Intravenous Every 12 hours 09/19/21 0554     09/19/21 0415  vancomycin (VANCOCIN) IVPB 1000 mg/200 mL premix        1,000 mg 200 mL/hr over 60 Minutes Intravenous  Once 09/19/21 0403 09/19/21  0554   09/19/21 0145  cefTRIAXone (ROCEPHIN) 2 g in sodium chloride 0.9 % 100 mL IVPB        2 g 200 mL/hr over 30 Minutes Intravenous  Once 09/19/21 0143 09/19/21 0316        Medication:   ascorbic acid  500 mg Oral Daily   atorvastatin  20 mg Oral Daily   baclofen  10 mg Oral TID   feeding supplement  237 mL Oral TID BM   gabapentin  600 mg Oral TID   polyethylene glycol  17 g Oral Daily   sodium hypochlorite   Irrigation BID   zinc sulfate  220 mg Oral Daily    albuterol, diazepam, HYDROcodone-acetaminophen   Objective:   Vitals:   09/20/21 1639 09/20/21 1654 09/20/21 1913 09/20/21 2119  BP: 137/83 133/84 (P) 133/82 127/77  Pulse: 62 71 (P) 73 75  Resp: 15 15 (P) 16 20  Temp: 98.5 F (36.9 C) 98.6 F (37 C) (P) 98.6 F (37 C) 98.3 F (36.8 C)  TempSrc: Oral  (P) Oral   SpO2:  100% (P) 98% 100%  Weight:      Height:        Intake/Output Summary (Last 24 hours) at 09/21/2021 1107 Last data filed at 09/21/2021 0800 Gross per 24 hour  Intake 5338.85 ml  Output 2800 ml  Net 2538.85 ml   Filed Weights   09/19/21 0130  Weight: 45.4 kg     Examination:       Physical Exam:   General:  AAO x 3,  cooperative, no distress;   HEENT:  Normocephalic, PERRL, otherwise with in Normal limits   Neuro:  CNII-XII intact. , normal motor and sensation, reflexes intact   Lungs:   Clear to auscultation BL, Respirations unlabored,  No wheezes / crackles  Cardio:    S1/S2, RRR, No murmure, No Rubs or Gallops   Abdomen:  Soft, non-tender, bowel sounds active all four quadrants, no guarding or peritoneal signs.  Muscular  skeletal:  Limited exam -global generalized weaknesses Bedbound - in bed, able to move all 4 extremities,   2+ pulses,  symmetric, No pitting edema  Skin:  Dry, warm to touch, negative for any Rashes, stage IV decub please see below  Wounds: Stage IV decubitus please see below description  Pressure Injury 03/13/20 Buttocks Right Unstageable - Full  thickness tissue loss in which the base of the injury is covered by slough (yellow, tan, gray, green or [place]) and/or eschar (tan, [place] or black) in the wound bed. (Active)  03/13/20 1800  Location: Buttocks  Location Orientation: Right  Staging: Unstageable - Full thickness tissue loss in which the base of the injury is covered by slough (yellow, tan, gray, green or [place]) and/or eschar (tan, [place] or black) in the wound bed.  Wound Description (Comments):   Present on Admission: Yes     Pressure Injury 02/15/21 Sacrum Medial Stage 4 - Full thickness tissue loss with exposed bone, tendon or muscle. 4cm x 4 cm x 2.5cm (Active)  02/15/21 0420  Location: Sacrum  Location Orientation: Medial  Staging: Stage 4 - Full thickness tissue loss with exposed bone, tendon or muscle.  Wound Description (Comments): 4cm x 4 cm x 2.5cm  Present on Admission: Yes     Pressure Injury 09/19/21 Sacrum Posterior Stage 4 - Full thickness tissue loss with exposed bone, tendon or muscle. (Active)  09/19/21 1111  Location: Sacrum  Location Orientation: Posterior  Staging: Stage 4 - Full thickness tissue loss with exposed bone, tendon or muscle.  Wound Description (Comments):   Present on Admission: Yes  Dressing Type Foam - Lift dressing to assess site every shift;ABD;Dakin's-soaked gauze 09/20/21 2119        ------------------------------------------------------------------------------------------------------------------------------------------    LABs:     Latest Ref Rng & Units 09/21/2021    6:23 AM 09/20/2021    8:23 AM 09/19/2021    1:35 AM  CBC  WBC 4.0 - 10.5 K/uL 8.6  7.8  12.8   Hemoglobin 12.0 - 15.0 g/dL 10.7  7.2    7.2  10.3   Hematocrit 36.0 - 46.0 % 33.3  23.1    23.4  32.0   Platelets 150 - 400 K/uL 250  228  295       Latest Ref Rng & Units 09/21/2021    6:23 AM 09/20/2021  6:32 AM 09/19/2021    1:35 AM  CMP  Glucose 70 - 99 mg/dL 82  94  129   BUN 8 - 23 mg/dL _0 Creatinine 0.44 - 1.00 mg/dL 0.50  0.53  0.64   Sodium 135 - 145 mmol/L 140  139  138   Potassium 3.5 - 5.1 mmol/L 3.8  3.3  3.8   Chloride 98 - 111 mmol/L 105  109  103   CO2 22 - 32 mmol/L _1 Calcium 8.9 - 10.3 mg/dL 8.6  8.2  8.4   Total Protein 6.5 - 8.1 g/dL   6.9   Total Bilirubin 0.3 - 1.2 mg/dL   0.3   Alkaline Phos 38 - 126 U/L   69   AST 15 - 41 U/L   15   ALT 0 - 44 U/L   8        Micro Results Recent Results (from the past 240 hour(s))  Culture, blood (Routine x 2)     Status: None (Preliminary result)   Collection Time: 09/19/21  1:35 AM   Specimen: BLOOD RIGHT FOREARM  Result Value Ref Range Status   Specimen Description   Final    BLOOD RIGHT FOREARM BOTTLES DRAWN AEROBIC AND ANAEROBIC   Special Requests Blood Culture adequate volume  Final   Culture   Final    NO GROWTH 2 DAYS Performed at [facility], [address] [place], [place] [address]    Report Status PENDING  Incomplete  Resp Panel by RT-PCR (Flu A&B, Covid) Anterior Nasal Swab     Status: None   Collection Time: 09/19/21  1:35 AM   Specimen: Anterior Nasal Swab  Result Value Ref Range Status   SARS Coronavirus 2 by RT PCR NEGATIVE NEGATIVE Final    Comment: (NOTE) SARS-CoV-2 target nucleic acids are NOT DETECTED.  The SARS-CoV-2 RNA is generally detectable in upper respiratory specimens during the acute phase of infection. The lowest concentration of SARS-CoV-2 viral copies this assay can detect is 138 copies/mL. A negative result does not preclude SARS-Cov-2 infection and should not be used as the sole basis for treatment or other patient management decisions. A negative result may occur with  improper specimen collection/handling, submission of specimen other than nasopharyngeal swab, presence of viral mutation(s) within the areas targeted by this assay, and inadequate number of viral copies(<138 copies/mL). A negative result must be combined with clinical observations, patient  history, and epidemiological information. The expected result is Negative.  Fact Sheet for Patients:  EntrepreneurPulse.com.au  Fact Sheet for Healthcare Providers:  IncredibleEmployment.be  This test is no t yet approved or cleared by the [place] FDA and  has been authorized for detection and/or diagnosis of SARS-CoV-2 by FDA under an Emergency Use Authorization (EUA). This EUA will remain  in effect (meaning this test can be used) for the duration of the COVID-19 declaration under Section 564(b)(1) of the Act, 21 U.S.C.section 360bbb-3(b)(1), unless the authorization is terminated  or revoked sooner.       Influenza A by PCR NEGATIVE NEGATIVE Final   Influenza B by PCR NEGATIVE NEGATIVE Final    Comment: (NOTE) The Xpert Xpress SARS-CoV-2/FLU/RSV plus assay is intended as an aid in the diagnosis of influenza from Nasopharyngeal swab specimens and should not be used as a sole basis for treatment. Nasal washings and aspirates are unacceptable for Xpert Xpress SARS-CoV-2/FLU/RSV testing.  Fact Sheet for Patients:  EntrepreneurPulse.com.au  Fact Sheet for Healthcare Providers: IncredibleEmployment.be  This test is not yet approved or cleared by the [place] FDA and has been authorized for detection and/or diagnosis of SARS-CoV-2 by FDA under an Emergency Use Authorization (EUA). This EUA will remain in effect (meaning this test can be used) for the duration of the COVID-19 declaration under Section 564(b)(1) of the Act, 21 U.S.C. section 360bbb-3(b)(1), unless the authorization is terminated or revoked.  Performed at [facility], [address] [place], [place] [address]   Urine Culture     Status: None (Preliminary result)   Collection Time: 09/19/21  1:35 AM   Specimen: In/Out Cath Urine  Result Value Ref Range Status   Specimen Description   Final    IN/OUT CATH URINE Performed at [facility], [address], [place], [place] [address]    Special Requests   Final    NONE Performed at [facility], [address], [place], [place] [address]    Culture   Final    CULTURE REINCUBATED FOR BETTER GROWTH Performed at [facility], [place] [address] [place], [place] [address]    Report Status PENDING  Incomplete  Culture, blood (Routine x 2)     Status: None (Preliminary result)   Collection Time: 09/19/21  1:42 AM   Specimen: BLOOD RIGHT FOREARM  Result Value Ref Range Status   Specimen Description   Final    BLOOD RIGHT FOREARM BOTTLES DRAWN AEROBIC AND ANAEROBIC   Special Requests Blood Culture adequate volume  Final   Culture   Final    NO GROWTH 2 DAYS Performed at [facility], [address], [place], [place] [address]    Report Status PENDING  Incomplete    Radiology Reports No results found.  SIGNED: Deatra James, MD, FHM. [facility],  Pager (please use [email or web address] to page/text) Please use Epic Secure Chat for non-urgent communication (7AM-7PM)  If 7PM-7AM, please contact night-coverage [email or web address], 09/21/2021, 11:07 AM

## 2021-09-22 DIAGNOSIS — Z7189 Other specified counseling: Secondary | ICD-10-CM

## 2021-09-22 DIAGNOSIS — Z515 Encounter for palliative care: Secondary | ICD-10-CM | POA: Diagnosis not present

## 2021-09-22 DIAGNOSIS — R5381 Other malaise: Secondary | ICD-10-CM | POA: Diagnosis not present

## 2021-09-22 DIAGNOSIS — A419 Sepsis, unspecified organism: Secondary | ICD-10-CM | POA: Diagnosis not present

## 2021-09-22 DIAGNOSIS — G35 Multiple sclerosis: Secondary | ICD-10-CM | POA: Diagnosis not present

## 2021-09-22 DIAGNOSIS — M869 Osteomyelitis, unspecified: Secondary | ICD-10-CM

## 2021-09-22 DIAGNOSIS — L89154 Pressure ulcer of sacral region, stage 4: Secondary | ICD-10-CM | POA: Diagnosis not present

## 2021-09-22 DIAGNOSIS — A021 Salmonella sepsis: Secondary | ICD-10-CM | POA: Diagnosis not present

## 2021-09-22 DIAGNOSIS — J449 Chronic obstructive pulmonary disease, unspecified: Secondary | ICD-10-CM | POA: Diagnosis not present

## 2021-09-22 DIAGNOSIS — I10 Essential (primary) hypertension: Secondary | ICD-10-CM | POA: Diagnosis not present

## 2021-09-22 LAB — CBC
HCT: 34.7 % — ABNORMAL LOW (ref 36.0–46.0)
Hemoglobin: 11.4 g/dL — ABNORMAL LOW (ref 12.0–15.0)
MCH: 28.6 pg (ref 26.0–34.0)
MCHC: 32.9 g/dL (ref 30.0–36.0)
MCV: 87.2 fL (ref 80.0–100.0)
Platelets: 275 10*3/uL (ref 150–400)
RBC: 3.98 MIL/uL (ref 3.87–5.11)
RDW: 15.7 % — ABNORMAL HIGH (ref 11.5–15.5)
WBC: 7.8 10*3/uL (ref 4.0–10.5)
nRBC: 0 % (ref 0.0–0.2)

## 2021-09-22 LAB — BASIC METABOLIC PANEL
Anion gap: 8 (ref 5–15)
BUN: 8 mg/dL (ref 8–23)
CO2: 26 mmol/L (ref 22–32)
Calcium: 8.8 mg/dL — ABNORMAL LOW (ref 8.9–10.3)
Chloride: 107 mmol/L (ref 98–111)
Creatinine, Ser: 0.51 mg/dL (ref 0.44–1.00)
GFR, Estimated: >60 mL/min (ref >60)
Glucose, Bld: 87 mg/dL (ref 70–99)
Potassium: 3.5 mmol/L (ref 3.5–5.1)
Sodium: 141 mmol/L (ref 135–145)

## 2021-09-22 LAB — URINE CULTURE: Culture: >=100000 — AB

## 2021-09-22 MED ORDER — CEFDINIR 300 MG PO CAPS: 300.0000 mg | ORAL_CAPSULE | Freq: Two times a day (BID) | ORAL | 0 refills | Status: AC

## 2021-09-22 MED ORDER — FERROUS SULFATE 325 (65 FE) MG PO TBEC: 325.0000 mg | DELAYED_RELEASE_TABLET | Freq: Two times a day (BID) | ORAL | 1 refills | Status: DC

## 2021-09-22 MED ORDER — LACTINEX PO CHEW: 1.0000 | CHEWABLE_TABLET | Freq: Three times a day (TID) | ORAL | 0 refills | Status: AC

## 2021-09-22 MED ORDER — DAKINS (1/4 STRENGTH) 0.125 % EX SOLN: Freq: Two times a day (BID) | CUTANEOUS | 0 refills | Status: DC

## 2021-09-22 MED ORDER — DOXYCYCLINE HYCLATE 100 MG PO TABS: 100.0000 mg | ORAL_TABLET | Freq: Two times a day (BID) | ORAL | 0 refills | Status: AC

## 2021-09-22 NOTE — Care Management Important Message (Signed)
Important Message  Patient Details  Name: Faith [name] MRN: [id number] Date of Birth: August 07, 1955   Medicare Important Message Given:  Yes     Tommy Medal 09/22/2021, 10:40 AM

## 2021-09-22 NOTE — Progress Notes (Signed)
  Subjective: No significant complaints.  Objective: Vital signs in last 24 hours: Temp:  [98.1 F (36.7 C)-98.8 F (37.1 C)] 98.1 F (36.7 C) (07/25 0447) Pulse Rate:  [71-76] 71 (07/25 0447) Resp:  [16-19] 16 (07/25 0447) BP: (128-129)/(71-79) 129/75 (07/25 0447) SpO2:  [92 %-100 %] 92 % (07/25 0447) Last BM Date : 09/19/21  Intake/Output from previous day: 07/24 0701 - 07/25 0700 In: 3361.5 [P.O.:960; I.V.:2400.5; IV Piggyback:1] Out: 8756 [Urine:3600] Intake/Output this shift: Total I/O In: -  Out: 100 [Urine:100]  General appearance: alert, cooperative, and no distress Skin: Sacral decubitus ulcer, stage IV, with granulation tissue present.  No purulent drainage noted.  No necrotic tissue seen.  Lab Results:  Recent Labs    09/21/21 0623 09/22/21 0442  WBC 8.6 7.8  HGB 10.7* 11.4*  HCT 33.3* 34.7*  PLT 250 275   BMET Recent Labs    09/21/21 0623 09/22/21 0442  NA 140 141  K 3.8 3.5  CL 105 107  CO2 27 26  GLUCOSE 82 87  BUN 9 8  CREATININE 0.50 0.51  CALCIUM 8.6* 8.8*   PT/INR Recent Labs    09/20/21 0632  LABPROT 15.1  INR 1.2    Studies/Results: No results found.  Anti-infectives: Anti-infectives (From admission, onward)    Start     Dose/Rate Route Frequency Ordered Stop   09/22/21 0000  cefdinir (OMNICEF) capsule 300 mg        300 mg Oral Every 12 hours 09/21/21 2305     09/22/21 0000  cefdinir (OMNICEF) 300 MG capsule        300 mg Oral Every 12 hours 09/22/21 0810 11/03/21 2359   09/22/21 0000  doxycycline (VIBRA-TABS) 100 MG tablet        100 mg Oral Every 12 hours 09/22/21 0810 10/22/21 2359   09/21/21 2315  doxycycline (VIBRA-TABS) tablet 100 mg        100 mg Oral Every 12 hours 09/21/21 2304     09/20/21 0400  vancomycin (VANCOREADY) IVPB 750 mg/150 mL  Status:  Discontinued        750 mg 150 mL/hr over 60 Minutes Intravenous Every 24 hours 09/19/21 0554 09/21/21 2303   09/19/21 0600  ceFEPIme (MAXIPIME) 2 g in sodium  chloride 0.9 % 100 mL IVPB  Status:  Discontinued        2 g 200 mL/hr over 30 Minutes Intravenous Every 12 hours 09/19/21 0554 09/21/21 2303   09/19/21 0415  vancomycin (VANCOCIN) IVPB 1000 mg/200 mL premix        1,000 mg 200 mL/hr over 60 Minutes Intravenous  Once 09/19/21 0403 09/19/21 0554   09/19/21 0145  cefTRIAXone (ROCEPHIN) 2 g in sodium chloride 0.9 % 100 mL IVPB        2 g 200 mL/hr over 30 Minutes Intravenous  Once 09/19/21 0143 09/19/21 0316       Assessment/Plan: Impression: Stage IV sacral decubitus ulcer, stable.  No need for debridement.  Would continue 1/4% Dakins solution to wound daily for 2 weeks.  May follow-up with wound care center as she was last seen by them on 09/07/2021.  LOS: 3 days    Aviva Signs 09/22/2021

## 2021-09-22 NOTE — Consult Note (Signed)
Consultation Note Date: 09/22/2021   Patient Name: Faith [name]  DOB: 1955/04/25  MRN: [id number]  Age / Sex: 66 y.o., female  PCP: Janifer Adie, MD Referring Physician: Deatra James, MD  Reason for Consultation: Establishing goals of care  HPI/Patient Profile: 66 y.o. female  with past medical history of multiple sclerosis, COPD, HTN, HLD, GERD admitted on 09/19/2021 with more fatigue and pain in sacral wound with drainage. CT with evidence of osteomyelitis of pelvis.   Clinical Assessment and Goals of Care: I met today with Faith [name] after reviewing records, history, and diagnostics. Noted ID recommendations. Noted that she previously had hospice care. Currently connected with [facility]. Faith [name] and I review her condition and the need for more help. She recognizes need for help but had a very bad experience at SNF previously noting that she was told to urinate/defecate in the bed and they will clean her every 2 hours. At home she reports that she can stand and pivot into her wheelchair and get herself to and from the bathroom - she reports this has been her baseline for many months but she does not believe for the past year. Her son's girlfriend does her dressing changes on the days that [facility] does not.   I begin to speak with Faith [name] more about her goals of care and the importance of these conversation. Faith [name] expresses understanding and acknowledges the importance of these conversations but also expresses that she has too much on her mind right now to really make these decisions. She reports that [facility] has tried to have these conversations with her but she needs more time in the conversations to really know what she wants and what to expect.   She is worried about going to a facility - she had a bad experience in the past. She is worried about getting her stuff to her - she was living with her son Faith [name] but she  says that he does not have a car to get to her. She is mostly concerned about her other son who has had a relapse from his sobriety and this is at the foremost of her thoughts at current time. She agrees to outpatient palliative care to follow, support, and continue goals of care conversations. She knows the importance of these conversations but is unable to truly focus on decisions and the future due to concerns for the unknown of going to a facility and worry for her son.   All questions/concerns addressed. Emotional support provided.   Primary Decision Maker She identified her surrogate decision maker as son Faith [name] but she has 3 adult children in total.     SUMMARY OF RECOMMENDATIONS   - Will need ongoing discussions - open to palliative to follow - Too distracted and stressed about other issues to make decisions at this time.   Code Status/Advance Care Planning: Full code - needs further conversation   Symptom Management:  Per attending.   Prognosis:  Overall prognosis poor with declined functional status in the setting of  MS and now with osteomyelitis.   Discharge Planning: [facility] for rehab with Palliative care service follow-up      Primary Diagnoses: Present on Admission:  Osteomyelitis ([place])  COPD (chronic obstructive pulmonary disease) ([place])  Essential hypertension  Sacral decubitus ulcer, stage IV ([facility])  Failure to thrive in adult  Multiple sclerosis ([place])  Hypokalemia   I have reviewed the medical record, interviewed the patient and family, and examined the patient. The following aspects are pertinent.  Past Medical History:  Diagnosis Date   Anemia    my whole life, Used to take iron   Arthritis    spine, lumbar   COPD (chronic obstructive pulmonary disease) (HCC)    Emphysema of lung ([facility])    GERD (gastroesophageal reflux disease) 04/12/2021   HTN (hypertension) 03/01/2020   Hyperlipidemia 11/28/2019   Narcolepsy    by history, has  nightmares   Neuromuscular disorder ([place])    leg issues from spine   Social History   Socioeconomic History   Marital status: Single    Spouse name: Not on file   Number of children: 3   Years of education: 12   Highest education level: Not on file  Occupational History   Occupation: disabled    Comment: house painting  Tobacco Use   Smoking status: Every Day    Packs/day: 0.25    Years: 45.00    Total pack years: 11.25    Types: Cigarettes   Smokeless tobacco: Never   Tobacco comments:    cutting down  Vaping Use   Vaping Use: Never used  Substance and Sexual Activity   Alcohol use: Not Currently    Alcohol/week: 1.0 standard drink of alcohol    Types: 1 Cans of beer per week   Drug use: No   Sexual activity: Not Currently    Birth control/protection: Post-menopausal  Other Topics Concern   Not on file  Social History Narrative   Patient lives with her son. Faith [name], age 2, frequently visits   Social Determinants of Health   Financial Resource Strain: Not on file  Food Insecurity: Not on file  Transportation Needs: Not on file  Physical Activity: Not on file  Stress: Not on file  Social Connections: Not on file   Family History  Problem Relation Age of Onset   Heart attack Mother    Kidney failure Mother    Diabetes Mother    Heart disease Mother    Hyperlipidemia Mother    Hypertension Mother    Heart attack Father 23   Hypertension Father    Hypertension Sister    Diabetes Sister    Other Brother        house fire   Hypertension Brother    Seizures Brother    Heart disease Brother        mitral valve   Arthritis Daughter        DJD, AS fibromyalgia   Polymyositis Daughter    Cancer Maternal Grandfather        lung   Heart disease Maternal Grandfather    Other Paternal Grandmother        house fire   Alcohol abuse Son    Scheduled Meds:  ascorbic acid  500 mg Oral Daily   atorvastatin  20 mg Oral Daily   baclofen  10 mg Oral TID    cefdinir  300 mg Oral Q12H   doxycycline  100 mg Oral Q12H   feeding supplement  237 mL Oral TID BM   gabapentin  600 mg Oral TID   polyethylene glycol  17 g Oral Daily   sodium hypochlorite   Irrigation BID   zinc sulfate  220 mg Oral Daily   Continuous Infusions:  ferric gluconate (FERRLECIT) IVPB 250 mg (09/21/21 0954)   lactated ringers 125 mL/hr at 09/22/21 0429   PRN Meds:.albuterol, diazepam, HYDROcodone-acetaminophen Allergies  Allergen Reactions   Darvon [Propoxyphene] Other (See Comments)    Hallicuations   Penicillins Rash    Did it involve swelling of the face/tongue/throat, SOB, or low BP? Unknown Did it involve sudden or severe rash/hives, skin peeling, or any reaction on the inside of your mouth or nose? Unknown Did you need to seek medical attention at a hospital or doctor's office? Unknown When did it last happen?      Unknown If all above answers are "NO", may proceed with cephalosporin use.    Review of Systems  Constitutional:  Negative for activity change and appetite change.  Respiratory:  Negative for shortness of breath.   Skin:  Positive for wound.  Neurological:  Positive for weakness.    Physical Exam Vitals and nursing note reviewed.  Constitutional:      General: She is not in acute distress.    Appearance: She is ill-appearing.  Cardiovascular:     Rate and Rhythm: Normal rate.  Pulmonary:     Effort: No tachypnea, accessory muscle usage or respiratory distress.  Neurological:     Mental Status: She is alert and oriented to person, place, and time.  Psychiatric:        Mood and Affect: Mood is anxious.     Comments: Stressed about going to facility and her son's relapse     Vital Signs: BP 129/75 (BP Location: Left Arm)   Pulse 71   Temp 98.1 F (36.7 C)   Resp 16   Ht 5' 4" (1.626 m)   Wt 45.4 kg   SpO2 92%   BMI 17.17 kg/m  Pain Scale: 0-10   Pain Score: 0-No pain   SpO2: SpO2: 92 % O2 Device:SpO2: 92 % O2 Flow Rate:  .   IO: Intake/output summary:  Intake/Output Summary (Last 24 hours) at 09/22/2021 1035 Last data filed at 09/22/2021 0800 Gross per 24 hour  Intake 3121.5 ml  Output 2500 ml  Net 621.5 ml    LBM: Last BM Date : 09/19/21 Baseline Weight: Weight: 45.4 kg Most recent weight: Weight: 45.4 kg     Palliative Assessment/Data:     Time In: 0950  Time Total: 55 min  Greater than 50%  of this time was spent counseling and coordinating care related to the above assessment and plan.  Signed by: Vinie Sill, NP Palliative Medicine Team Pager # [phone] (M-F 8a-5p) Team Phone # [phone] (Nights/Weekends)

## 2021-09-22 NOTE — Discharge Summary (Signed)
Physician Discharge Summary   Patient: Faith [name] MRN: [id number] DOB: September 13, 1955  Admit date:     09/19/2021  Discharge date: 09/22/21  Discharge Physician: Deatra James   PCP: Janifer Adie, MD   Recommendations at discharge:   Follow-up with infectious disease team Dr. Tommy Medal in 4-6 weeks Continue currently recommended antibiotics for 6 weeks Continue aggressive wound care, repositioning every 2 hours(stage IV decubitus) Wound care team consultation, dressing change per recommendations Follow-up with palliative care as an outpatient Follow-up with PCP in 2-4 weeks  Discharge Diagnoses: Principal Problem:   Sepsis ([place]) Active Problems:   Iron deficiency anemia due to chronic blood loss   Sacral decubitus ulcer, stage IV (HCC)   Osteomyelitis ([place])   UTI (urinary tract infection)   COPD (chronic obstructive pulmonary disease) ([place])   Essential hypertension   Protein-calorie malnutrition, moderate ([place])   Multiple sclerosis ([place])   Failure to thrive in adult   Hypokalemia   Debility   Sacral osteomyelitis ([place])  Resolved Problems:   * No resolved hospital problems. *  Hospital Course: Faith [name] is a 66 y.o. female with medical history significant of COPD, GERD, hypertension, hyperlipidemia, neuromuscular disorder- MS bedbound, presents  to ED with a chief complaint of sacral wound.   Patient reports wound started 1 year ago.  She came in today because she has been more fatigued and the pain has been worse.  She describes the pain in the wound as pressure and sharp.  It is worse with positional changes.  It is better when she takes her hydrocodone at home.  She denies fever.  She admits to yellow-green drainage from the wound.  Patient reports this drainage is malodorous.  No dysuria, does report malodorous urine.  She has had no hematuria.  Although patient visibly appears short of breath she reports no shortness of breath, chest pain, palpitations.  Patient  reports no nausea vomiting or abdominal pain.  She does report she has had a decrease in appetite.  She is drinking plenty of water and Gatorade at home.  Patient has no other complaints at this time.  ED: Temp 98.4-102.9, respiratory rate 15-24, heart rate 76-93, blood pressure 94/61-108/74, satting at 92-100% Leukocytosis 12.8, hemoglobin 10.3, platelets 295 Chemistries unremarkable Albumin 3.0 UA is indicative of UTI Blood culture pending Urine culture pending CT C-spine is 3.4 mm anterior listhesis at C4 but stable, marked severity multi level degenerative changes CT head shows no acute intracranial abnormality CT pelvis shows mild slightly asymmetric urinary bladder wall thickening and acute osteomyelitis -Follow-up MRI has been recommended on CT/has been determined to be nonurgent, so patient has been admitted to [facility] and Plan: * Sepsis ([place])  -Resolved sepsis physiology  On admission met Sepsis criteria -secondary to osteomyelitis versus UTI SIRS criteria temp 102.9, heart rate 93, respiratory rate 24, leukocytosis 12.8, with altered mental status Most likely source of infection is osteomyelitis Per surgery stage IV decubitus not source of sepsis UTI-colonized ESBL per ID not source of sepsis Life threatening organ dysfunction 2/2 infection is evidenced by: Altered mental status Antibiotics was on  Rocephin and vancomycin, then cefepime and vancomycin >> to p.o. cefdinir and doxycycline Fluids given prior to admission 2 L bolus Continue LR at 125 mill per hour>> subsequently discontinued Sepsis order set utilized  Iron deficiency anemia due to chronic blood loss - Severe acute on chronic iron deficiency anemia Iron/TIBC/Ferritin/ %Sat    Component Value Date/Time  IRON 6 (L) 09/20/2021 0823   TIBC 186 (L) 09/20/2021 0823   FERRITIN 39 02/16/2021 0507   IRONPCTSAT 3 (L) 09/20/2021 0823      Latest Ref Rng & Units 09/22/2021    4:42 AM 09/21/2021     6:23 AM 09/20/2021    8:23 AM  CBC  WBC 4.0 - 10.5 K/uL 7.8  8.6  7.8   Hemoglobin 12.0 - 15.0 g/dL 11.4  10.7  7.2    7.2   Hematocrit 36.0 - 46.0 % 34.7  33.3  23.1    23.4   Platelets 150 - 400 K/uL 275  250  2    Was Hypotensive with severe generalized weaknesses  -s/p  2 units of PRBC transfusion on  09/20/2021  -IV iron infusion x3 days... We will switch to p.o. supplements  UTI (urinary tract infection) - UA indicative of UTI -was on cefepime -Urine culture >>>> ESBL -Per infectious disease review recommendation this is a colonization and not a source of infection No further antibiotic needed for covering UTI  Osteomyelitis ([place]) - Osteomyelitis seen on CT scan of pelvis - We will Continue cefepime and vancomycin -Blood cultures >>> no growth to date -Plan to narrow antibiotics based on cultures -We will likely have to be discharged on extended course of antibiotics -Continue to monitor -ID consulted >>> status post aggressive treatment with broad-spectrum IV antibiotics of cefepime/Vanco After extensive discussion with ID Dr. Tommy Medal was decided the patient may benefit from p.o. antibiotics versus IV not a good candidate for PICC line -Therefore recommended p.o. cefdinir and doxycycline for 6 weeks Close follow-up as an outpatient  Sacral decubitus ulcer, stage IV ([place]) - General surgery consulted, status post evaluation recommending no further surgical invention or debridement -Surgery does not believe that this is the source of infection and sepsis  -Associated osteomyelitis of pelvic bone -Continue cefepime and vancomycin >>> discontinued 09/21/2021 -Currently afebrile normotensive improved leukocytosis - Abx per sepsis -ID consulted, following closely-switch to p.o. cefdinir and doxycycline  Protein-calorie malnutrition, moderate (HCC) - Albumin 3.0 -Continue feeding supplement -Continue to encourage nutrient dense food choices  Essential  hypertension Blood pressure stabilized, resuming beta-blockers   COPD (chronic obstructive pulmonary disease) ([place]) - remain stable  -Remain on RA satting 100% -Current smoker -Counseled on the importance of cessation -no inhalers on home med list -albuterol PRN    Debility - Multiple falls  - Severe debility, bedbound due to progressive MS -Continue monitoring closely -Multiple hospitalizations   -Recommend every 2 hours position change -We strongly recommend skilled nursing facility-patient currently refusing -Discussed with the care team and patient's daughter will  pursue SNF   Hypokalemia -Resolved - Monitoring and repleting checking magnesium   Failure to thrive in adult Body mass index is 17.17 kg/m. -Due to chronic illness -Dietitian consulted Continue dietary supplements  Multiple sclerosis ([place]) With severe debility, bedbound -Falling, -Steadily declining -Recommending palliative care-discussed with daughter in detail         Consultants: General surgery, infectious disease team Procedures performed: None only dressing changes to stage IV decubitus Disposition: Skilled nursing facility Diet recommendation:  Discharge Diet Orders (From admission, onward)     Start     Ordered   09/22/21 0000  Diet general       Comments: With dietary supplements per dietitian   09/22/21 0810           Regular diet DISCHARGE MEDICATION: Allergies as of 09/22/2021  Reactions   Darvon [propoxyphene] Other (See Comments)   Hallicuations   Penicillins Rash   Did it involve swelling of the face/tongue/throat, SOB, or low BP? Unknown Did it involve sudden or severe rash/hives, skin peeling, or any reaction on the inside of your mouth or nose? Unknown Did you need to seek medical attention at a hospital or doctor's office? Unknown When did it last happen?      Unknown If all above answers are "NO", may proceed with cephalosporin use.         Medication List     STOP taking these medications    propranolol 60 MG tablet Commonly known as: INDERAL       TAKE these medications    ascorbic acid 500 MG tablet Commonly known as: VITAMIN C Take 1 tablet (500 mg total) by mouth daily.   atorvastatin 20 MG tablet Commonly known as: LIPITOR Take 20 mg by mouth daily.   baclofen 10 MG tablet Commonly known as: LIORESAL Take 1 tablet (10 mg total) by mouth 3 (three) times daily.   cefdinir 300 MG capsule Commonly known as: OMNICEF Take 1 capsule (300 mg total) by mouth every 12 (twelve) hours.   celecoxib 200 MG capsule Commonly known as: CELEBREX Take 1 capsule (200 mg total) by mouth 2 (two) times daily as needed.   chlorhexidine 0.12 % solution Commonly known as: PERIDEX Use as directed 15 mLs in the mouth or throat 4 (four) times daily.   cycloSPORINE 0.05 % ophthalmic emulsion Commonly known as: RESTASIS Place 1 drop into both eyes 2 (two) times daily.   diazepam 5 MG tablet Commonly known as: VALIUM Take 1 tablet (5 mg total) by mouth See admin instructions. TAKE (1) TABLET BY MOUTH EVERY SIX HOURS AS NEEDED FOR MUSCLE SPASMS.   doxycycline 100 MG tablet Commonly known as: VIBRA-TABS Take 1 tablet (100 mg total) by mouth every 12 (twelve) hours.   feeding supplement Liqd Take 237 mLs by mouth 3 (three) times daily between meals.   gabapentin 300 MG capsule Commonly known as: Neurontin Take 2 capsules (600 mg total) by mouth 3 (three) times daily.   HYDROcodone-acetaminophen 5-325 MG tablet Commonly known as: NORCO/VICODIN Take 1 tablet by mouth every 6 (six) hours as needed for moderate pain.   lactobacillus acidophilus & bulgar chewable tablet Chew 1 tablet by mouth 3 (three) times daily with meals.   Mavenclad (4 Tabs) 10 MG Tbpk Generic drug: Cladribine (4 Tabs) Take 10 mg by mouth as directed. Take 1 tablet PO each day for 4 days. Repeat cycle one month later.   polyethylene glycol 17 g  packet Commonly known as: MiraLax Take 17 g by mouth daily.   sodium hypochlorite 0.125 % Soln Commonly known as: DAKIN'S 1/4 STRENGTH Irrigate with as directed 2 (two) times daily at 10 AM and 5 PM.   Vitamin D3 50 MCG (2000 UT) capsule Take 2,000 Units by mouth daily.   zinc sulfate 220 (50 Zn) MG capsule Take 1 capsule (220 mg total) by mouth daily.        Discharge Exam: Filed Weights   09/19/21 0130  Weight: 45.4 kg      Physical Exam:   General:  AAO x 3,  cooperative, no distress;   HEENT:  Normocephalic, PERRL, otherwise with in Normal limits   Neuro:  CNII-XII intact. , normal motor and sensation, reflexes intact   Lungs:   Clear to auscultation BL, Respirations unlabored,  No wheezes /  crackles  Cardio:    S1/S2, RRR, No murmure, No Rubs or Gallops   Abdomen:  Soft, non-tender, bowel sounds active all four quadrants, no guarding or peritoneal signs.  Muscular  skeletal:  Limited exam -severe global generalized weaknesses - in bed, able to move all 4 extremities,   2+ pulses,  symmetric, No pitting edema  Skin:  Dry, warm to touch, negative for any Rashes, stage IV sacral decubitus please see description below  Wounds: Please see nursing documentation  Pressure Injury 03/13/20 Buttocks Right Unstageable - Full thickness tissue loss in which the base of the injury is covered by slough (yellow, tan, gray, green or [place]) and/or eschar (tan, [place] or black) in the wound bed. (Active)  03/13/20 1800  Location: Buttocks  Location Orientation: Right  Staging: Unstageable - Full thickness tissue loss in which the base of the injury is covered by slough (yellow, tan, gray, green or [place]) and/or eschar (tan, [place] or black) in the wound bed.  Wound Description (Comments):   Present on Admission: Yes     Pressure Injury 02/15/21 Sacrum Medial Stage 4 - Full thickness tissue loss with exposed bone, tendon or muscle. 4cm x 4 cm x 2.5cm (Active)  02/15/21 0420   Location: Sacrum  Location Orientation: Medial  Staging: Stage 4 - Full thickness tissue loss with exposed bone, tendon or muscle.  Wound Description (Comments): 4cm x 4 cm x 2.5cm  Present on Admission: Yes     Pressure Injury 09/19/21 Sacrum Posterior Stage 4 - Full thickness tissue loss with exposed bone, tendon or muscle. (Active)  09/19/21 1111  Location: Sacrum  Location Orientation: Posterior  Staging: Stage 4 - Full thickness tissue loss with exposed bone, tendon or muscle.  Wound Description (Comments):   Present on Admission: Yes  Dressing Type Foam - Lift dressing to assess site every shift 09/21/21 2030          Condition at discharge: stable  The results of significant diagnostics from this hospitalization (including imaging, microbiology, ancillary and laboratory) are listed below for reference.   Imaging Studies: CT PELVIS WO CONTRAST  Result Date: 09/19/2021 CLINICAL DATA:  Status post fall. EXAM: CT PELVIS WITHOUT CONTRAST TECHNIQUE: Multidetector CT imaging of the pelvis was performed following the standard protocol without intravenous contrast. RADIATION DOSE REDUCTION: This exam was performed according to the departmental dose-optimization program which includes automated exposure control, adjustment of the mA and/or kV according to patient size and/or use of iterative reconstruction technique. COMPARISON:  December 26, 2020 FINDINGS: Urinary Tract: There is mild urinary bladder wall thickening which is slightly more prominent along the anterolateral aspect of the bladder on the left. Bowel: There is no evidence of bowel dilatation. A large amount of stool is seen throughout the large bowel. Noninflamed diverticula are seen throughout the sigmoid colon. Vascular/Lymphatic: There is marked severity calcification of the visualized portion of the abdominal aorta and bilateral common iliac arteries, without evidence of aneurysmal dilatation. Reproductive: Uterine atrophy  is suspected. The bilateral adnexa are unremarkable. Other:  None. Musculoskeletal: An ill-defined decubitus ulcer is seen along the posterior aspect of the lower sacrum. This represents a new finding when compared to the prior study. A mild amount of associated soft tissue air is seen which extends along the posterior cortex of the lower sacrum on the left (axial CT images 59 through 65, CT series 3). A mild amount of cortical destruction is seen. There is no evidence of an acute fracture or dislocation. Marked  severity degenerative changes seen within the visualized portion of the lower lumbar spine. This is most prominent at the level of L4-L5. IMPRESSION: 1. Ill-defined sacral decubitus ulcer along the posterior aspect of the lower sacrum on the left, with additional findings consistent with acute osteomyelitis. Further evaluation with MRI is recommended. 2. Sigmoid diverticulosis. 3. Mild, slightly asymmetric urinary bladder wall thickening. Correlation with urinalysis is recommended to exclude the presence of an underlying cystitis. 4. Aortic atherosclerosis. Aortic Atherosclerosis (ICD10-I70.0). Electronically Signed   By: Virgina Norfolk M.D.   On: 09/19/2021 04:30   CT CERVICAL SPINE WO CONTRAST  Result Date: 09/19/2021 CLINICAL DATA:  Altered mental status with recent fall. EXAM: CT CERVICAL SPINE WITHOUT CONTRAST TECHNIQUE: Multidetector CT imaging of the cervical spine was performed without intravenous contrast. Multiplanar CT image reconstructions were also generated. RADIATION DOSE REDUCTION: This exam was performed according to the departmental dose-optimization program which includes automated exposure control, adjustment of the mA and/or kV according to patient size and/or use of iterative reconstruction technique. COMPARISON:  Cervical spine CT, dated January 05, 2009 and cervical spine MR, dated March 01, 2020 FINDINGS: Alignment: There is mild reversal of the normal cervical spine  lordosis. Stable, approximately 3.4 mm anterolisthesis of the C4 vertebral body is noted on C5 (sagittal reformatted image 32, CT series 5). Skull base and vertebrae: No acute fracture. A metallic density fusion plate and screws are seen along the anterior aspects of the C3 and C4 vertebral bodies. Bilateral metallic density pedicle screws are also seen at these levels, with prior posterior decompression seen at the levels of C3-C4 and C4-C5. Soft tissues and spinal canal: No prevertebral fluid or swelling. No visible canal hematoma. Disc levels: Marked severity endplate sclerosis and moderate severity anterior osteophyte formation are seen at the levels of C5-C6 and C6-C7. Mild to moderate severity endplate sclerosis is seen at the levels of C2-C3 and C4-C5. There is marked severity narrowing of the anterior atlantoaxial articulation, with marked severity intervertebral disc space narrowing seen at the levels of C4-C5, C5-C6 and C6-C7. Bilateral marked severity multilevel facet joint hypertrophy is noted. Upper chest: Negative. Other: None. IMPRESSION: 1. Stable, approximately 3.4 mm anterolisthesis of the C4 vertebral body on C5. 2. Marked severity multilevel degenerative changes, as described above, without evidence of an acute cervical spine fracture. 3. Stable postoperative changes consistent with C3-C4 anterior fusion and posterior decompression at the levels of C3-C4 and C4-C5. Electronically Signed   By: Virgina Norfolk M.D.   On: 09/19/2021 04:16   CT HEAD WO CONTRAST (5MM)  Result Date: 09/19/2021 CLINICAL DATA:  Status post fall with altered mental status and fever. EXAM: CT HEAD WITHOUT CONTRAST TECHNIQUE: Contiguous axial images were obtained from the base of the skull through the vertex without intravenous contrast. RADIATION DOSE REDUCTION: This exam was performed according to the departmental dose-optimization program which includes automated exposure control, adjustment of the mA and/or kV  according to patient size and/or use of iterative reconstruction technique. COMPARISON:  December 10, 2020 FINDINGS: Brain: There is mild cerebral atrophy with widening of the extra-axial spaces and ventricular dilatation. Stable areas of decreased attenuation are seen within the bilateral periventricular white matter. There is no evidence of an acute infarct, hemorrhage, hydrocephalus, extra-axial collection or mass lesion/mass effect. Vascular: No hyperdense vessel or unexpected calcification. Skull: Stable focal left frontal hyperostosis frontalis internus is seen. Sinuses/Orbits: No acute finding. Other: None. IMPRESSION: 1. No acute intracranial abnormality. 2. Stable areas of decreased attenuation within the bilateral  periventricular white matter, consistent with the patient's history of multiple sclerosis. Electronically Signed   By: Virgina Norfolk M.D.   On: 09/19/2021 04:04   DG Chest 1 View  Result Date: 09/19/2021 CLINICAL DATA:  Altered mental status and fever. EXAM: CHEST  1 VIEW COMPARISON:  April 11, 2021 FINDINGS: The heart size and mediastinal contours are within normal limits. There is marked severity calcification of the thoracic aorta. Both lungs are clear. Postoperative changes are seen within the visualized portion of the cervical spine. No acute osseous abnormalities are identified. IMPRESSION: No active cardiopulmonary disease. Electronically Signed   By: Virgina Norfolk M.D.   On: 09/19/2021 02:19   DG Knee Right Port  Result Date: 09/18/2021 CLINICAL DATA:  Fall EXAM: PORTABLE RIGHT KNEE - 4 VIEW COMPARISON:  None Available. FINDINGS: No evidence of fracture, dislocation, or joint effusion. No significant degenerative changes. Diffuse demineralization. Vascular calcifications. IMPRESSION: No acute osseous abnormality. Electronically Signed   By: Yetta Glassman M.D.   On: 09/18/2021 08:29    Microbiology: Results for orders placed or performed during the hospital  encounter of 09/19/21  Culture, blood (Routine x 2)     Status: None (Preliminary result)   Collection Time: 09/19/21  1:35 AM   Specimen: BLOOD RIGHT FOREARM  Result Value Ref Range Status   Specimen Description   Final    BLOOD RIGHT FOREARM BOTTLES DRAWN AEROBIC AND ANAEROBIC   Special Requests Blood Culture adequate volume  Final   Culture   Final    NO GROWTH 3 DAYS Performed at [facility], [address], [place], [place] [address]    Report Status PENDING  Incomplete  Resp Panel by RT-PCR (Flu A&B, Covid) Anterior Nasal Swab     Status: None   Collection Time: 09/19/21  1:35 AM   Specimen: Anterior Nasal Swab  Result Value Ref Range Status   SARS Coronavirus 2 by RT PCR NEGATIVE NEGATIVE Final    Comment: (NOTE) SARS-CoV-2 target nucleic acids are NOT DETECTED.  The SARS-CoV-2 RNA is generally detectable in upper respiratory specimens during the acute phase of infection. The lowest concentration of SARS-CoV-2 viral copies this assay can detect is 138 copies/mL. A negative result does not preclude SARS-Cov-2 infection and should not be used as the sole basis for treatment or other patient management decisions. A negative result may occur with  improper specimen collection/handling, submission of specimen other than nasopharyngeal swab, presence of viral mutation(s) within the areas targeted by this assay, and inadequate number of viral copies(<138 copies/mL). A negative result must be combined with clinical observations, patient history, and epidemiological information. The expected result is Negative.  Fact Sheet for Patients:  EntrepreneurPulse.com.au  Fact Sheet for Healthcare Providers:  IncredibleEmployment.be  This test is no t yet approved or cleared by the [place] FDA and  has been authorized for detection and/or diagnosis of SARS-CoV-2 by FDA under an Emergency Use Authorization (EUA). This EUA will remain  in effect  (meaning this test can be used) for the duration of the COVID-19 declaration under Section 564(b)(1) of the Act, 21 U.S.C.section 360bbb-3(b)(1), unless the authorization is terminated  or revoked sooner.       Influenza A by PCR NEGATIVE NEGATIVE Final   Influenza B by PCR NEGATIVE NEGATIVE Final    Comment: (NOTE) The Xpert Xpress SARS-CoV-2/FLU/RSV plus assay is intended as an aid in the diagnosis of influenza from Nasopharyngeal swab specimens and should not be used as a sole basis for  treatment. Nasal washings and aspirates are unacceptable for Xpert Xpress SARS-CoV-2/FLU/RSV testing.  Fact Sheet for Patients: EntrepreneurPulse.com.au  Fact Sheet for Healthcare Providers: IncredibleEmployment.be  This test is not yet approved or cleared by the [place] FDA and has been authorized for detection and/or diagnosis of SARS-CoV-2 by FDA under an Emergency Use Authorization (EUA). This EUA will remain in effect (meaning this test can be used) for the duration of the COVID-19 declaration under Section 564(b)(1) of the Act, 21 U.S.C. section 360bbb-3(b)(1), unless the authorization is terminated or revoked.  Performed at [facility], [address] [place], [place] [address]   Urine Culture     Status: Abnormal (Preliminary result)   Collection Time: 09/19/21  1:35 AM   Specimen: In/Out Cath Urine  Result Value Ref Range Status   Specimen Description   Final    IN/OUT CATH URINE Performed at [facility], [address] [place], [place] [address]    Special Requests   Final    NONE Performed at [facility], [address] [place], [place] [address]    Culture (A)  Final    >=100,000 COLONIES/mL AEROCOCCUS SPECIES Standardized susceptibility testing for this organism is not available. Performed at [facility], [place] [address] [place], [place] [address]    Report Status PENDING  Incomplete  Culture, blood (Routine x 2)      Status: None (Preliminary result)   Collection Time: 09/19/21  1:42 AM   Specimen: BLOOD RIGHT FOREARM  Result Value Ref Range Status   Specimen Description   Final    BLOOD RIGHT FOREARM BOTTLES DRAWN AEROBIC AND ANAEROBIC   Special Requests Blood Culture adequate volume  Final   Culture   Final    NO GROWTH 3 DAYS Performed at [facility], [address], [place], [place] [address]    Report Status PENDING  Incomplete    Labs: CBC: Recent Labs  Lab 09/19/21 0135 09/20/21 0823 09/21/21 0623 09/22/21 0442  WBC 12.8* 7.8 8.6 7.8  NEUTROABS 10.5*  --   --   --   HGB 10.3* 7.2*  7.2* 10.7* 11.4*  HCT 32.0* 23.4*  23.1* 33.3* 34.7*  MCV 87.0 88.3 87.6 87.2  PLT 295 228 250 338   Basic Metabolic Panel: Recent Labs  Lab 09/19/21 0135 09/20/21 0632 09/21/21 0623 09/22/21 0442  NA 138 139 140 141  K 3.8 3.3* 3.8 3.5  CL 103 109 105 107  CO2 '26 25 27 26  ' GLUCOSE 129* 94 82 87  BUN '20 12 9 8  ' CREATININE 0.64 0.53 0.50 0.51  CALCIUM 8.4* 8.2* 8.6* 8.8*  MG  --  1.9  --   --    Liver Function Tests: Recent Labs  Lab 09/19/21 0135  AST 15  ALT 8  ALKPHOS 69  BILITOT 0.3  PROT 6.9  ALBUMIN 3.0*   CBG: No results for input(s): "GLUCAP" in the last 168 hours.  Discharge time spent: greater than 55 minutes.  Signed: Deatra James, MD [facility] 09/22/2021

## 2021-09-22 NOTE — TOC Transition Note (Signed)
Transition of Care [facility]) - CM/SW Discharge Note   Patient Details  Name: Faith [name] MRN: [id number] Date of Birth: Jul 26, 1955  Transition of Care [facility]) CM/SW Contact:  Ihor Gully, LCSW Phone Number: 09/22/2021, 12:36 PM   Clinical Narrative:    Estill Bamberg at [facility] indicates that [facility] will not approve SNF placement. Patient will be provided PT/OT at [facility]. Daughter notified.   Final next level of care: Other (comment) (HOme with [facility] services.) Barriers to Discharge: No Barriers Identified   Patient Goals and CMS Choice Patient states their goals for this hospitalization and ongoing recovery are:: patient wants to go home with [facility] services      Discharge Placement                Patient to be transferred to facility by: [facility] Name of family member notified: daughter Patient and family notified of of transfer: 09/22/21  Discharge Plan and Services     Post Acute Care Choice: Durable Medical Equipment                               Social Determinants of Health (SDOH) Interventions     Readmission Risk Interventions    02/16/2021   11:32 AM 12/12/2020   12:47 PM 03/31/2020    1:03 PM  Readmission Risk Prevention Plan  Post Dischage Appt  Complete Complete  Medication Screening  Complete Complete  Transportation Screening Complete Complete Complete  Home Care Screening Complete    Medication Review (RN CM) Complete

## 2021-09-24 LAB — CULTURE, BLOOD (ROUTINE X 2)
Culture: NO GROWTH
Culture: NO GROWTH
Special Requests: ADEQUATE
Special Requests: ADEQUATE

## 2021-09-25 ENCOUNTER — Emergency Department (HOSPITAL_COMMUNITY)
Admission: EM | Admit: 2021-09-25 | Discharge: 2021-09-26 | Disposition: A | Discharge status: Home / Self Care | Payer: Medicare Other | Attending: Emergency Medicine | Admitting: Emergency Medicine

## 2021-09-25 DIAGNOSIS — Z4800 Encounter for change or removal of nonsurgical wound dressing: Secondary | ICD-10-CM | POA: Diagnosis not present

## 2021-09-25 DIAGNOSIS — Z79899 Other long term (current) drug therapy: Secondary | ICD-10-CM | POA: Diagnosis not present

## 2021-09-25 DIAGNOSIS — J449 Chronic obstructive pulmonary disease, unspecified: Secondary | ICD-10-CM | POA: Diagnosis not present

## 2021-09-25 DIAGNOSIS — L89154 Pressure ulcer of sacral region, stage 4: Secondary | ICD-10-CM | POA: Diagnosis not present

## 2021-09-25 DIAGNOSIS — I10 Essential (primary) hypertension: Secondary | ICD-10-CM | POA: Diagnosis present

## 2021-09-25 NOTE — ED Triage Notes (Signed)
Pt comes via [facility] to have a wound repacked, bleeding from wound, left hospital today

## 2021-09-26 ENCOUNTER — Encounter (HOSPITAL_COMMUNITY): Payer: Self-pay

## 2021-09-26 NOTE — ED Provider Notes (Signed)
[facility] EMERGENCY DEPARTMENT Provider Note   CSN: [id number] Arrival date & time: 09/25/21  2342     History  Chief Complaint  Patient presents with   Wound Check    Faith [name] is a 66 y.o. female.  Patient is a 66 year old female with past medical history of multiple sclerosis with very limited mobility, COPD, hypertension, and recent admission for sepsis from a stage IV decubitus ulcer.  Patient received antibiotics and wound care and was discharged yesterday.  She was seen today at [facility] and had a dressing change.  This evening, she began having bleeding from the decubitus ulcer and was transported here for evaluation of this.  She denies any new complaints.  She denies any fevers.  She is receiving IV antibiotics at home.  The history is provided by the patient.       Home Medications Prior to Admission medications   Medication Sig Start Date End Date Taking? Authorizing Provider  ascorbic acid (VITAMIN C) 500 MG tablet Take 1 tablet (500 mg total) by mouth daily. 02/18/21   Geradine Girt, DO  atorvastatin (LIPITOR) 20 MG tablet Take 20 mg by mouth daily.    [provider]  baclofen (LIORESAL) 10 MG tablet Take 1 tablet (10 mg total) by mouth 3 (three) times daily. 02/17/21   Geradine Girt, DO  cefdinir (OMNICEF) 300 MG capsule Take 1 capsule (300 mg total) by mouth every 12 (twelve) hours. 09/22/21 11/03/21  Shahmehdi, Valeria Batman, MD  celecoxib (CELEBREX) 200 MG capsule Take 1 capsule (200 mg total) by mouth 2 (two) times daily as needed. 04/15/21   Shelly Coss, MD  chlorhexidine (PERIDEX) 0.12 % solution Use as directed 15 mLs in the mouth or throat 4 (four) times daily.    [provider]  Cholecalciferol (VITAMIN D3) 50 MCG (2000 UT) capsule Take 2,000 Units by mouth daily. 02/11/21   [provider]  Cladribine, 4 Tabs, (MAVENCLAD, 4 TABS,) 10 MG TBPK Take 10 mg by mouth as directed. Take 1 tablet PO each day for 4 days. Repeat cycle one  month later.    Sater, Nanine Means, MD  cycloSPORINE (RESTASIS) 0.05 % ophthalmic emulsion Place 1 drop into both eyes 2 (two) times daily.    [provider]  diazepam (VALIUM) 5 MG tablet Take 1 tablet (5 mg total) by mouth See admin instructions. TAKE (1) TABLET BY MOUTH EVERY SIX HOURS AS NEEDED FOR MUSCLE SPASMS. 02/17/21   Geradine Girt, DO  doxycycline (VIBRA-TABS) 100 MG tablet Take 1 tablet (100 mg total) by mouth every 12 (twelve) hours. 09/22/21 10/22/21  Shahmehdi, Valeria Batman, MD  feeding supplement (ENSURE ENLIVE / ENSURE PLUS) LIQD Take 237 mLs by mouth 3 (three) times daily between meals. 12/12/20   Alma Friendly, MD  ferrous sulfate 325 (65 FE) MG EC tablet Take 1 tablet (325 mg total) by mouth in the morning and at bedtime. 09/22/21 11/21/21  Shahmehdi, Valeria Batman, MD  gabapentin (NEURONTIN) 300 MG capsule Take 2 capsules (600 mg total) by mouth 3 (three) times daily. 02/05/19   Angiulli, Lavon Paganini, PA-C  HYDROcodone-acetaminophen (NORCO/VICODIN) 5-325 MG tablet Take 1 tablet by mouth every 6 (six) hours as needed for moderate pain. 02/17/21   Geradine Girt, DO  lactobacillus acidophilus & bulgar (LACTINEX) chewable tablet Chew 1 tablet by mouth 3 (three) times daily with meals. 09/22/21 10/22/21  Shahmehdi, Valeria Batman, MD  polyethylene glycol (MIRALAX) 17 g packet Take 17 g  by mouth daily. 12/25/20   Fredia Sorrow, MD  sodium hypochlorite (DAKIN'S 1/4 STRENGTH) 0.125 % SOLN Irrigate with as directed 2 (two) times daily at 10 AM and 5 PM. 09/22/21   Shahmehdi, Valeria Batman, MD  zinc sulfate 220 (50 Zn) MG capsule Take 1 capsule (220 mg total) by mouth daily. 02/18/21   Geradine Girt, DO      Allergies    Darvon [propoxyphene] and Penicillins    Review of Systems   Review of Systems  All other systems reviewed and are negative.   Physical Exam Updated Vital Signs BP 105/68   Pulse 79   Temp 98 F (36.7 C)   Resp 18   SpO2 94%  Physical Exam Vitals and nursing note  reviewed.  Constitutional:      Appearance: Normal appearance.  HENT:     Head: Normocephalic and atraumatic.  Pulmonary:     Effort: Pulmonary effort is normal.  Skin:    General: Skin is warm and dry.     Comments: Examination of the ulcer reveals a full-thickness decubitus sore.  There is no purulent drainage or active bleeding.  There is no surrounding erythema.  There is visible bone and muscle.  Neurological:     Mental Status: She is alert.     ED Results / Procedures / Treatments   Labs (all labs ordered are listed, but only abnormal results are displayed) Labs Reviewed - No data to display  EKG None  Radiology No results found.  Procedures Procedures    Medications Ordered in ED Medications - No data to display  ED Course/ Medical Decision Making/ A&P  Patient's wound was packed with no further bleeding.  She is nontoxic-appearing and I do not believe additional work-up is indicated.  She will be discharged with as needed return.  Final Clinical Impression(s) / ED Diagnoses Final diagnoses:  None    Rx / DC Orders ED Discharge Orders     None         Veryl Speak, MD 09/26/21 [phone]

## 2021-09-26 NOTE — ED Notes (Signed)
Wound cleaned, and clean wet to dry dressing applied

## 2021-09-26 NOTE — Discharge Instructions (Signed)
Continue antibiotics and packing as previously instructed.

## 2021-10-06 ENCOUNTER — Encounter (HOSPITAL_BASED_OUTPATIENT_CLINIC_OR_DEPARTMENT_OTHER): Payer: Medicare Other | Attending: Internal Medicine | Admitting: Internal Medicine

## 2021-10-20 ENCOUNTER — Encounter (HOSPITAL_BASED_OUTPATIENT_CLINIC_OR_DEPARTMENT_OTHER): Payer: Medicare Other | Attending: Internal Medicine | Admitting: Internal Medicine

## 2021-10-20 DIAGNOSIS — J449 Chronic obstructive pulmonary disease, unspecified: Secondary | ICD-10-CM | POA: Diagnosis not present

## 2021-10-20 DIAGNOSIS — L89154 Pressure ulcer of sacral region, stage 4: Secondary | ICD-10-CM | POA: Diagnosis not present

## 2021-10-20 DIAGNOSIS — Z681 Body mass index (BMI) 19 or less, adult: Secondary | ICD-10-CM | POA: Diagnosis not present

## 2021-10-20 DIAGNOSIS — R627 Adult failure to thrive: Secondary | ICD-10-CM | POA: Diagnosis not present

## 2021-10-20 DIAGNOSIS — G35 Multiple sclerosis: Secondary | ICD-10-CM | POA: Diagnosis not present

## 2021-10-20 DIAGNOSIS — Z993 Dependence on wheelchair: Secondary | ICD-10-CM | POA: Diagnosis not present

## 2021-10-20 DIAGNOSIS — J439 Emphysema, unspecified: Secondary | ICD-10-CM | POA: Diagnosis not present

## 2021-10-20 DIAGNOSIS — E46 Unspecified protein-calorie malnutrition: Secondary | ICD-10-CM | POA: Insufficient documentation

## 2021-10-20 DIAGNOSIS — Z7401 Bed confinement status: Secondary | ICD-10-CM | POA: Diagnosis not present

## 2021-10-20 DIAGNOSIS — M4628 Osteomyelitis of vertebra, sacral and sacrococcygeal region: Secondary | ICD-10-CM | POA: Diagnosis not present

## 2021-10-20 DIAGNOSIS — F17210 Nicotine dependence, cigarettes, uncomplicated: Secondary | ICD-10-CM | POA: Diagnosis not present

## 2021-10-20 DIAGNOSIS — Z8619 Personal history of other infectious and parasitic diseases: Secondary | ICD-10-CM | POA: Insufficient documentation

## 2021-10-20 NOTE — Progress Notes (Signed)
PALESTINE, MOSCO ([id number]) Visit Report for 10/20/2021 Arrival Information Details Patient Name: Date of Service: Faith [name], Faith [name] 10/20/2021 12:45 PM Medical Record Number: [id number] Patient Account Number: [id number] Date of Birth/Sex: Treating RN: 01/26/1956 (66 y.o. Faith [name], Faith [name] Primary Care [name]: Faith [name] Other Clinician: Referring Faith [name]: Treating Faith [name]/Extender: Faith [name] in Treatment: 6 Visit Information History Since Last Visit Added or deleted any medications: No Patient Arrived: Wheel Chair Any new allergies or adverse reactions: No Arrival Time: 12:38 Had a fall or experienced change in No Accompanied By: self activities of daily living that may affect Transfer Assistance: Transfer Board risk of falls: Patient Identification Verified: Yes Signs or symptoms of abuse/neglect since last visito No Secondary Verification Process Completed: Yes Hospitalized since last visit: No Patient Requires Transmission-Based Precautions: No Implantable device outside of the clinic excluding No Patient Has Alerts: No cellular tissue based products placed in the center since last visit: Has Dressing in Place as Prescribed: Yes Pain Present Now: Yes Electronic Signature(s) Signed: 10/20/2021 3:41:21 PM By: Faith Hammock RN Entered By: Faith [name] on 10/20/2021 12:39:08 -------------------------------------------------------------------------------- Clinic Level of Care Assessment Details Patient Name: Date of Service: Faith [name] 10/20/2021 12:45 PM Medical Record Number: [id number] Patient Account Number: [id number] Date of Birth/Sex: Treating RN: 1955/04/11 (66 y.o. Faith [name] Primary Care Maxson Oddo: Faith [name] Other Clinician: Referring Diamante Rubin: Treating Jakayden Cancio/Extender: Faith [name] in Treatment: 6 Clinic Level of Care Assessment Items TOOL 4 Quantity Score X- 1  0 Use when only an EandM is performed on FOLLOW-UP visit ASSESSMENTS - Nursing Assessment / Reassessment X- 1 10 Reassessment of Co-morbidities (includes updates in patient status) X- 1 5 Reassessment of Adherence to Treatment Plan ASSESSMENTS - Wound and Skin A ssessment / Reassessment '[]'$  - 0 Simple Wound Assessment / Reassessment - one wound X- 1 5 Complex Wound Assessment / Reassessment - multiple wounds '[]'$  - 0 Dermatologic / Skin Assessment (not related to wound area) ASSESSMENTS - Focused Assessment '[]'$  - 0 Circumferential Edema Measurements - multi extremities X- 1 10 Nutritional Assessment / Counseling / Intervention '[]'$  - 0 Lower Extremity Assessment (monofilament, tuning fork, pulses) '[]'$  - 0 Peripheral Arterial Disease Assessment (using hand held doppler) ASSESSMENTS - Ostomy and/or Continence Assessment and Care '[]'$  - 0 Incontinence Assessment and Management '[]'$  - 0 Ostomy Care Assessment and Management (repouching, etc.) PROCESS - Coordination of Care '[]'$  - 0 Simple Patient / Family Education for ongoing care X- 1 20 Complex (extensive) Patient / Family Education for ongoing care X- 1 10 Staff obtains Programmer, systems, Records, T Results / Process Orders est X- 1 10 Staff telephones HHA, Nursing Homes / Clarify orders / etc '[]'$  - 0 Routine Transfer to another Facility (non-emergent condition) '[]'$  - 0 Routine Hospital Admission (non-emergent condition) '[]'$  - 0 New Admissions / Biomedical engineer / Ordering NPWT Apligraf, etc. , '[]'$  - 0 Emergency Hospital Admission (emergent condition) '[]'$  - 0 Simple Discharge Coordination X- 1 15 Complex (extensive) Discharge Coordination PROCESS - Special Needs '[]'$  - 0 Pediatric / Minor Patient Management '[]'$  - 0 Isolation Patient Management '[]'$  - 0 Hearing / Language / Visual special needs '[]'$  - 0 Assessment of Community assistance (transportation, D/C planning, etc.) '[]'$  - 0 Additional assistance / Altered mentation '[]'$  -  0 Support Surface(s) Assessment (bed, cushion, seat, etc.) INTERVENTIONS - Wound Cleansing / Measurement '[]'$  - 0 Simple Wound Cleansing - one wound X- 1 5 Complex Wound Cleansing -  multiple wounds X- 1 5 Wound Imaging (photographs - any number of wounds) '[]'$  - 0 Wound Tracing (instead of photographs) '[]'$  - 0 Simple Wound Measurement - one wound X- 1 5 Complex Wound Measurement - multiple wounds INTERVENTIONS - Wound Dressings '[]'$  - 0 Small Wound Dressing one or multiple wounds X- 1 15 Medium Wound Dressing one or multiple wounds '[]'$  - 0 Large Wound Dressing one or multiple wounds '[]'$  - 0 Application of Medications - topical '[]'$  - 0 Application of Medications - injection INTERVENTIONS - Miscellaneous '[]'$  - 0 External ear exam '[]'$  - 0 Specimen Collection (cultures, biopsies, blood, body fluids, etc.) '[]'$  - 0 Specimen(s) / Culture(s) sent or taken to Lab for analysis '[]'$  - 0 Patient Transfer (multiple staff / Civil Service fast streamer / Similar devices) '[]'$  - 0 Simple Staple / Suture removal (25 or less) '[]'$  - 0 Complex Staple / Suture removal (26 or more) '[]'$  - 0 Hypo / Hyperglycemic Management (close monitor of Blood Glucose) '[]'$  - 0 Ankle / Brachial Index (ABI) - do not check if billed separately X- 1 5 Vital Signs Has the patient been seen at the hospital within the last three years: Yes Total Score: 120 Level Of Care: New/Established - Level 4 Electronic Signature(s) Signed: 10/20/2021 3:55:04 PM By: Deon Pilling RN, BSN Entered By: Deon Pilling on 10/20/2021 13:21:54 -------------------------------------------------------------------------------- Encounter Discharge Information Details Patient Name: Date of Service: Faith Lack A. 10/20/2021 12:45 PM Medical Record Number: [id number] Patient Account Number: [id number] Date of Birth/Sex: Treating RN: 1955/10/28 (66 y.o. Faith [name], Faith [name] Primary Care Rylyn Zawistowski: Faith [name] Other Clinician: Referring Celester Morgan: Treating  [name]/Extender: Faith [name] in Treatment: 6 Encounter Discharge Information Items Discharge Condition: Stable Ambulatory Status: Wheelchair Discharge Destination: Home Transportation: Private Auto Accompanied By: self Schedule Follow-up Appointment: No Clinical Summary of Care: Electronic Signature(s) Signed: 10/20/2021 3:55:04 PM By: Deon Pilling RN, BSN Entered By: Deon Pilling on 10/20/2021 13:22:45 -------------------------------------------------------------------------------- Lower Extremity Assessment Details Patient Name: Date of Service: Faith Lack A. 10/20/2021 12:45 PM Medical Record Number: [id number] Patient Account Number: [id number] Date of Birth/Sex: Treating RN: 1955/12/22 (66 y.o. Faith [name], Faith [name] Primary Care Tram Wrenn: Faith [name] Other Clinician: Referring Ellah Otte: Treating Montee Tallman/Extender: Faith [name] in Treatment: 6 Electronic Signature(s) Signed: 10/20/2021 3:41:21 PM By: Faith Hammock RN Entered By: Faith [name] on 10/20/2021 12:41:22 -------------------------------------------------------------------------------- Multi Wound Chart Details Patient Name: Date of Service: Faith Lack A. 10/20/2021 12:45 PM Medical Record Number: [id number] Patient Account Number: [id number] Date of Birth/Sex: Treating RN: 27-Feb-1956 (66 y.o. Faith [name], Faith [name] Primary Care Daniella Dewberry: Faith [name] Other Clinician: Referring Purcell Jungbluth: Treating Adelise Buswell/Extender: Faith [name] in Treatment: 6 Vital Signs Height(in): 65 Pulse(bpm): 70 Weight(lbs): 101 Blood Pressure(mmHg): 106/67 Body Mass Index(BMI): 16.8 Temperature(F): 98 Respiratory Rate(breaths/min): 16 Photos: [N/A:N/A] Sacrum N/A N/A Wound Location: Pressure Injury N/A N/A Wounding Event: Pressure Ulcer N/A N/A Primary Etiology: Anemia, Chronic Obstructive N/A N/A Comorbid  History: Pulmonary Disease (COPD), Hypertension, Osteoarthritis 08/29/2020 N/A N/A Date Acquired: 6 N/A N/A Weeks of Treatment: Open N/A N/A Wound Status: No N/A N/A Wound Recurrence: 5.8x4.4x1.4 N/A N/A Measurements L x W x D (cm) 20.043 N/A N/A A (cm) : rea 28.061 N/A N/A Volume (cm) : -6.30% N/A N/A % Reduction in A rea: 0.80% N/A N/A % Reduction in Volume: 12 Starting Position 1 (o'clock): 12 Ending Position 1 (o'clock): 4.2 Maximum Distance 1 (cm): Yes N/A N/A Undermining: Category/Stage IV N/A N/A Classification:  Large N/A N/A Exudate A mount: Serosanguineous N/A N/A Exudate Type: red, Steely N/A N/A Exudate Color: Epibole N/A N/A Wound Margin: Large (67-100%) N/A N/A Granulation A mount: Red, Pink N/A N/A Granulation Quality: None Present (0%) N/A N/A Necrotic A mount: Fat Layer (Subcutaneous Tissue): Yes N/A N/A Exposed Structures: Muscle: Yes Fascia: No Tendon: No Joint: No Bone: No None N/A N/A Epithelialization: Treatment Notes Wound #1 (Sacrum) Cleanser Peri-Wound Care Topical Primary Dressing Dakin's Solution 0.25%, 16 (oz) Discharge Instruction: Moisten gauze with Dakin's solution Secondary Dressing ALLEVYN Gentle Border, 5x5 (in/in) Discharge Instruction: Apply over primary dressing as directed. Secured With Compression Wrap Compression Stockings Environmental education officer) Signed: 10/20/2021 3:51:23 PM By: Kalman Shan DO Signed: 10/20/2021 3:55:04 PM By: Deon Pilling RN, BSN Entered By: Kalman Shan on 10/20/2021 13:23:56 -------------------------------------------------------------------------------- Multi-Disciplinary Care Plan Details Patient Name: Date of Service: Faith Lack A. 10/20/2021 12:45 PM Medical Record Number: [id number] Patient Account Number: [id number] Date of Birth/Sex: Treating RN: June 17, 1955 (66 y.o. Faith [name], Faith [name] Primary Care Davontay Watlington: Faith [name] Other Clinician: Referring  Raydan Schlabach: Treating Kendyl Festa/Extender: Faith [name] in Treatment: 6 Active Inactive Electronic Signature(s) Signed: 10/20/2021 3:41:21 PM By: Faith Hammock RN Signed: 10/20/2021 3:55:04 PM By: Deon Pilling RN, BSN Entered By: Deon Pilling on 10/20/2021 13:22:55 -------------------------------------------------------------------------------- Pain Assessment Details Patient Name: Date of Service: Faith Lack A. 10/20/2021 12:45 PM Medical Record Number: [id number] Patient Account Number: [id number] Date of Birth/Sex: Treating RN: [birth year] (66 y.o. Faith [name], Faith [name] Primary Care Neng Albee: Faith [name] Other Clinician: Referring Makaiah Terwilliger: Treating Andreka Stucki/Extender: Faith [name] in Treatment: 6 Active Problems Location of Pain Severity and Description of Pain Patient Has Paino Yes Site Locations Pain Location: Pain Location: Generalized Pain, Pain in Ulcers Rate the pain. Current Pain Level: 4 Pain Management and Medication Current Pain Management: Medication: No Cold Application: No Rest: No Massage: No Activity: No T.E.N.S.: No Heat Application: No Leg drop or elevation: No Is the Current Pain Management Adequate: Adequate How does your wound impact your activities of daily livingo Sleep: No Bathing: No Appetite: No Relationship With Others: No Bladder Continence: No Emotions: No Bowel Continence: No Work: No Toileting: No Drive: No Dressing: No Hobbies: No Electronic Signature(s) Signed: 10/20/2021 3:41:21 PM By: Faith Hammock RN Entered By: Faith [name] on 10/20/2021 12:40:53 -------------------------------------------------------------------------------- Patient/Caregiver Education Details Patient Name: Date of Service: Faith [name] 8/22/2023andnbsp12:45 PM Medical Record Number: [id number] Patient Account Number: [id number] Date of Birth/Gender: Treating RN: [birth year] (66  y.o. [name] Primary Care Physician: Faith [name] Other Clinician: Referring Physician: Treating Physician/Extender: Faith [name] in Treatment: 6 Education Assessment Education Provided To: Patient Education Topics Provided Wound/Skin Impairment: Handouts: Skin Care Do's and Dont's Methods: Explain/Verbal Responses: Reinforcements needed Electronic Signature(s) Signed: 10/20/2021 3:55:04 PM By: Deon Pilling RN, BSN Entered By: Deon Pilling on 10/20/2021 12:51:28 -------------------------------------------------------------------------------- Wound Assessment Details Patient Name: Date of Service: Faith Lack A. 10/20/2021 12:45 PM Medical Record Number: [id number] Patient Account Number: [id number] Date of Birth/Sex: Treating RN: 1955-05-17 (66 y.o. Faith [name], Faith [name] Primary Care Elysia Grand: Faith [name] Other Clinician: Referring Amalee Olsen: Treating Zeenat Jeanbaptiste/Extender: Faith [name] in Treatment: 6 Wound Status Wound Number: 1 Primary Pressure Ulcer Etiology: Wound Location: Sacrum Wound Open Wounding Event: Pressure Injury Status: Date Acquired: 08/29/2020 Comorbid Anemia, Chronic Obstructive Pulmonary Disease (COPD), Weeks Of Treatment: 6 History: Hypertension, Osteoarthritis Clustered Wound: No Photos Wound Measurements Length: (cm) 5.8 Width: (cm)  4.4 Depth: (cm) 1.4 Area: (cm) 20.043 Volume: (cm) 28.061 % Reduction in Area: -6.3% % Reduction in Volume: 0.8% Epithelialization: None Tunneling: No Undermining: Yes Starting Position (o'clock): 12 Ending Position (o'clock): 12 Maximum Distance: (cm) 4.2 Wound Description Classification: Category/Stage IV Wound Margin: Epibole Exudate Amount: Large Exudate Type: Serosanguineous Exudate Color: red, Mostafa Foul Odor After Cleansing: No Slough/Fibrino No Wound Bed Granulation Amount: Large (67-100%) Exposed Structure Granulation  Quality: Red, Pink Fascia Exposed: No Necrotic Amount: None Present (0%) Fat Layer (Subcutaneous Tissue) Exposed: Yes Tendon Exposed: No Muscle Exposed: Yes Necrosis of Muscle: No Joint Exposed: No Bone Exposed: No Electronic Signature(s) Signed: 10/20/2021 3:41:21 PM By: Faith Hammock RN Entered By: Faith [name] on 10/20/2021 12:46:22 -------------------------------------------------------------------------------- Wound Assessment Details Patient Name: Date of Service: Faith Lack A. 10/20/2021 12:45 PM Medical Record Number: [id number] Patient Account Number: [id number] Date of Birth/Sex: Treating RN: 29-Apr-1955 (66 y.o. Faith [name] Primary Care Simren Popson: Faith [name] Other Clinician: Referring Everette Mall: Treating Adel Burch/Extender: Faith [name] in Treatment: 6 Wound Status Wound Number: 2 Primary Pressure Ulcer Etiology: Wound Location: Right, Dorsal Foot Wound Open Wounding Event: Pressure Injury Status: Date Acquired: 07/30/2020 Comorbid Anemia, Chronic Obstructive Pulmonary Disease (COPD), Weeks Of Treatment: 0 History: Hypertension, Osteoarthritis Clustered Wound: No Photos Wound Measurements Length: (cm) 1.5 Width: (cm) 1.8 Depth: (cm) 0.2 Area: (cm) 2.121 Volume: (cm) 0.424 % Reduction in Area: % Reduction in Volume: Epithelialization: Small (1-33%) Tunneling: No Undermining: No Wound Description Classification: Category/Stage III Exudate Amount: Medium Exudate Type: Serosanguineous Exudate Color: red, Bobst Foul Odor After Cleansing: No Slough/Fibrino No Wound Bed Granulation Amount: Medium (34-66%) Exposed Structure Granulation Quality: Pink Fascia Exposed: No Necrotic Amount: Small (1-33%) Fat Layer (Subcutaneous Tissue) Exposed: Yes Necrotic Quality: Adherent Slough Tendon Exposed: No Muscle Exposed: No Joint Exposed: No Bone Exposed: No Electronic Signature(s) Signed: 10/20/2021 3:55:04 PM  By: Deon Pilling RN, BSN Signed: 10/20/2021 4:08:49 PM By: Erenest Blank Entered By: Erenest Blank on 10/20/2021 13:44:26 -------------------------------------------------------------------------------- Vitals Details Patient Name: Date of Service: Faith Lack A. 10/20/2021 12:45 PM Medical Record Number: [id number] Patient Account Number: [id number] Date of Birth/Sex: Treating RN: 10/04/1955 (66 y.o. Faith [name], Faith [name] Primary Care Pranish Akhavan: Faith [name] Other Clinician: Referring Marilyne Haseley: Treating Baby Stairs/Extender: Faith [name] in Treatment: 6 Vital Signs Time Taken: 12:40 Temperature (F): 98 Height (in): 65 Pulse (bpm): 70 Weight (lbs): 101 Respiratory Rate (breaths/min): 16 Body Mass Index (BMI): 16.8 Blood Pressure (mmHg): 106/67 Reference Range: 80 - 120 mg / dl Electronic Signature(s) Signed: 10/20/2021 3:41:21 PM By: Faith Hammock RN Entered By: Faith [name] on 10/20/2021 12:41:09

## 2021-10-20 NOTE — Progress Notes (Signed)
Faith [name], Faith [name] ([id number]) Visit Report for 10/20/2021 Chief Complaint Document Details Patient Name: Date of Service: Faith [name], Faith [name] 10/20/2021 12:45 PM Medical Record Number: [id number] Patient Account Number: [id number] Date of Birth/Sex: Treating RN: 04-03-1955 (66 y.o. Faith [name], [name] Primary Care Provider: Janifer Adie Other Clinician: Referring Provider: Treating Provider/Extender: Idelia Salm in Treatment: 6 Information Obtained from: Patient Chief Complaint 09/07/2021; stage IV sacral decubitus ulcer Electronic Signature(s) Signed: 10/20/2021 3:51:23 PM By: Kalman Shan DO Entered By: Kalman Shan on 10/20/2021 13:24:28 -------------------------------------------------------------------------------- HPI Details Patient Name: Date of Service: Faith Lack A. 10/20/2021 12:45 PM Medical Record Number: [id number] Patient Account Number: [id number] Date of Birth/Sex: Treating RN: Nov 14, 1955 (66 y.o. Faith [name] Primary Care Provider: Janifer Adie Other Clinician: Referring Provider: Treating Provider/Extender: Idelia Salm in Treatment: 6 History of Present Illness HPI Description: Admission 09/07/2021 Ms. 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 She has been using Hydrofera Blue to the wound bed. She is a patient of [facility]. She follows with Dr. Bradd Burner. She currently denies any issues and states the wound has been stable. She currently denies signs of infection. She had an MRI of the lumbar spine on 04/12/2021 at that time they noted no evidence of osteomyelitis to the sacrum or lumbar spine. She reports sleeping in a recliner. She does have a hospital bed but does not have an air mattress. She is able to ambulate with a walker however mostly  remains wheelchair-bound. 10/20/2021; patient presents for follow-up. She reports that the wound VAC was started however quickly discontinued due to green drainage. She is currently using Dakin's wet-to-dry dressings. She was admitted to the hospital on 09/19/2021 for sepsis secondary to her stage IV sacral ulcer. She is currently on p.o. cefdinir and doxycycline for total of 6 weeks And following with infectious disease. During the admission it was recommended that she follow-up with palliative care as an outpatient. Electronic Signature(s) Signed: 10/20/2021 3:51:23 PM By: Kalman Shan DO Entered By: Kalman Shan on 10/20/2021 13:31:32 -------------------------------------------------------------------------------- Physical Exam Details Patient Name: Date of Service: Faith [name], Faith [name] 10/20/2021 12:45 PM Medical Record Number: [id number] Patient Account Number: [id number] Date of Birth/Sex: Treating RN: 1955/07/10 (66 y.o. Faith [name] Primary Care Provider: Janifer Adie Other Clinician: Referring Provider: Treating Provider/Extender: Idelia Salm in Treatment: 6 Constitutional respirations regular, non-labored and within target range for patient.Marland Kitchen Psychiatric pleasant and cooperative. Notes Sacrum: Open wound with undermining circumferentially and pale granulation tissue at the opening. No signs of surrounding soft tissue infection. Electronic Signature(s) Signed: 10/20/2021 3:51:23 PM By: Kalman Shan DO Entered By: Kalman Shan on 10/20/2021 13:31:56 -------------------------------------------------------------------------------- Physician Orders Details Patient Name: Date of Service: Faith Lack A. 10/20/2021 12:45 PM Medical Record Number: [id number] Patient Account Number: [id number] Date of Birth/Sex: Treating RN: Mar 07, 1955 (66 y.o. Faith [name] Primary Care Provider: Janifer Adie Other Clinician: Referring  Provider: Treating Provider/Extender: Idelia Salm in Treatment: 6 Verbal / Phone Orders: No Diagnosis Coding ICD-10 Coding Code Description L89.154 Pressure ulcer of sacral region, stage 4 G35 Multiple sclerosis J44.9 Chronic obstructive pulmonary disease, unspecified Z72.0 Tobacco use Discharge From [facility] Services Discharge from [place] to follow with patient. Doctor's  recommendation is Dakin's Wet to dry dressings will see patient as needed. This is a nonhealing wound that would benefit from palliative care. Patient can follow up as needed here at the wound care center. Anesthetic (In clinic) Topical Lidocaine 5% applied to wound bed Home Health Other Home Health Orders/Instructions: - [facility] Patient Medications llergies: Darvon, penicillin A Notifications Medication Indication Start End 10/20/2021 lidocaine DOSE topical 5 % gel - gel topical applied only in clinic for any debridements. Electronic Signature(s) Signed: 10/20/2021 3:51:23 PM By: Kalman Shan DO Entered By: Kalman Shan on 10/20/2021 13:32:08 -------------------------------------------------------------------------------- Problem List Details Patient Name: Date of Service: Faith Lack A. 10/20/2021 12:45 PM Medical Record Number: [id number] Patient Account Number: [id number] Date of Birth/Sex: Treating RN: February 08, 1956 (66 y.o. Tonita Phoenix, Lauren Primary Care Provider: Janifer Adie Other Clinician: Referring Provider: Treating Provider/Extender: Idelia Salm in Treatment: 6 Active Problems ICD-10 Encounter Code Description Active Date MDM Diagnosis L89.154 Pressure ulcer of sacral region, stage 4 09/07/2021 No Yes G35 Multiple sclerosis 09/07/2021 No Yes J44.9 Chronic obstructive pulmonary disease, unspecified 09/07/2021 No Yes Z72.0 Tobacco use 09/07/2021 No Yes M46.28 Osteomyelitis of vertebra,  sacral and sacrococcygeal region 10/20/2021 No Yes Inactive Problems Resolved Problems Electronic Signature(s) Signed: 10/20/2021 3:51:23 PM By: Kalman Shan DO Entered By: Kalman Shan on 10/20/2021 13:42:15 -------------------------------------------------------------------------------- Progress Note Details Patient Name: Date of Service: Faith Lack A. 10/20/2021 12:45 PM Medical Record Number: [id number] Patient Account Number: [id number] Date of Birth/Sex: Treating RN: 09/16/1955 (66 y.o. Faith [name] Primary Care Provider: Janifer Adie Other Clinician: Referring Provider: Treating Provider/Extender: Idelia Salm in Treatment: 6 Subjective Chief Complaint Information obtained from Patient 09/07/2021; stage IV sacral decubitus ulcer History of Present Illness (HPI) Admission 09/07/2021 Ms. 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 She has been using Hydrofera Blue to the wound bed. She is a patient of [facility]. She follows with Dr. Bradd Burner. She currently denies any issues and states the wound has been stable. She currently denies signs of infection. She had an MRI of the lumbar spine on 04/12/2021 at that time they noted no evidence of osteomyelitis to the sacrum or lumbar spine. She reports sleeping in a recliner. She does have a hospital bed but does not have an air mattress. She is able to ambulate with a walker however mostly remains wheelchair-bound. 10/20/2021; patient presents for follow-up. She reports that the wound VAC was started however quickly discontinued due to green drainage. She is currently using Dakin's wet-to-dry dressings. She was admitted to the hospital on 09/19/2021 for sepsis secondary to her stage IV sacral ulcer. She is currently on p.o. cefdinir and  doxycycline for total of 6 weeks And following with infectious disease. During the admission it was recommended that she follow-up with palliative care as an outpatient. Patient History Information obtained from Chart. Family History Cancer - Maternal Grandparents, Diabetes - Mother, Heart Disease - Mother,Father,Siblings,Maternal Grandparents, Hypertension - Mother,Father,Siblings, Kidney Disease - Mother, Seizures - Siblings. Social History Current every day smoker - 0.25 pack a day, Alcohol Use - Never, Drug Use - No History, Caffeine Use - Never. Medical History Hematologic/Lymphatic Patient has history of Anemia Respiratory Patient has history of Chronic Obstructive Pulmonary Disease (COPD) Cardiovascular Patient has history of Hypertension Musculoskeletal Patient  has history of Osteoarthritis Hospitalization/Surgery History - 01/19/2019 cervical laminectomy. - ORIF facial fx. - 03/2020 cardioversion. - sepsis 7/22-7/25/2023 UTI. Medical A Surgical History Notes nd Constitutional Symptoms (General Health) MS Respiratory emphysema Cardiovascular hyperlipidemia PVD aortic atherosclerosis Musculoskeletal MS DJD cervical myelopathy spondylolisthesis of the cervical region. 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 Vitals Time Taken: 12:40 PM, Height: 65 in, Weight: 101 lbs, BMI: 16.8, Temperature: 98 F, Pulse: 70 bpm, Respiratory Rate: 16 breaths/min, Blood Pressure: 106/67 mmHg. Psychiatric pleasant and cooperative. General Notes: Sacrum: Open wound with undermining circumferentially and pale granulation tissue at the opening. No signs of surrounding soft tissue infection. Integumentary (Hair, Skin) Wound #1 status is Open. Original cause of wound was Pressure Injury. The date acquired was: 08/29/2020. The wound has been in treatment 6 weeks. The wound is located on the Sacrum. The  wound measures 5.8cm length x 4.4cm width x 1.4cm depth; 20.043cm^2 area and 28.061cm^3 volume. There is muscle and Fat Layer (Subcutaneous Tissue) exposed. There is no tunneling noted, however, there is undermining starting at 12:00 and ending at 12:00 with a maximum distance of 4.2cm. There is a large amount of serosanguineous drainage noted. The wound margin is epibole. There is large (67-100%) red, pink granulation within the wound bed. There is no necrotic tissue within the wound bed. Assessment Active Problems ICD-10 Pressure ulcer of sacral region, stage 4 Multiple sclerosis Chronic obstructive pulmonary disease, unspecified Tobacco use Patient has a stage IV sacral decubitus ulcer with findings of osteomyelitis on recent CT scan done 09/19/2021. She is currently on oral antibiotics and following with infectious disease. In addition to this she has multiple barriers to her wound healing which include being bedbound due to multiple sclerosis, COPD with current tobacco use, protein malnutrition with last albumin of 3.0. This is a nonhealing wound in a patient with a failure to thrive picture And wound will likely never heal. She is not a candidate for the wound VAC anymore. She is a patient of [facility]. And sees them daily for her wound care. At this time I recommended continuing Dakin's wet-to-dry dressings. This will likely be the dressing she uses Indefinitely. I also recommended she follow-up with palliative care. We will see her as needed. This was discussed with the patient and she expressed understanding. Plan Discharge From [facility] Services: Discharge from [place] to follow with patient. Doctor's recommendation is Dakin's Wet to dry dressings will see patient as needed. This is a nonhealing wound that would benefit from palliative care. Patient can follow up as needed here at the wound care center. Anesthetic: (In clinic) Topical Lidocaine 5%  applied to wound bed Home Health: Other Home Health Orders/Instructions: - [facility] The following medication(s) was prescribed: lidocaine topical 5 % gel gel topical applied only in clinic for any debridements. was prescribed at facility 1. Continue Dakin's wet-to-dry dressing 2. Aggressive offloading 3. Follow-up as needed Electronic Signature(s) Signed: 10/20/2021 3:51:23 PM By: Kalman Shan DO Entered By: Kalman Shan on 10/20/2021 13:41:35 -------------------------------------------------------------------------------- HxROS Details Patient Name: Date of Service: Faith [name], [name] 10/20/2021 12:45 PM Medical Record Number: [id number] Patient Account Number: [id number] Date of Birth/Sex: Treating RN: 1955-12-26 (66 y.o. Tonita Phoenix, Lauren Primary Care Provider: Janifer Adie Other Clinician: Referring Provider: Treating Provider/Extender: Idelia Salm in Treatment: 6 Information Obtained From Chart Constitutional Symptoms (General Health) Medical History: Past Medical  History Notes: MS Hematologic/Lymphatic Medical History: Positive for: Anemia Respiratory Medical History: Positive for: Chronic Obstructive Pulmonary Disease (COPD) Past Medical History Notes: emphysema Cardiovascular Medical History: Positive for: Hypertension Past Medical History Notes: hyperlipidemia PVD aortic atherosclerosis Musculoskeletal Medical History: Positive for: Osteoarthritis Past Medical History Notes: MS DJD cervical myelopathy spondylolisthesis of the cervical region. 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 Immunizations Pneumococcal Vaccine: Received Pneumococcal Vaccination: Yes Received Pneumococcal Vaccination On or After 60th Birthday: Yes Implantable Devices No devices added Hospitalization / Surgery History Type of Hospitalization/Surgery 01/19/2019 cervical laminectomy ORIF  facial fx 03/2020 cardioversion sepsis 7/22-7/25/2023 UTI Family and Social History Cancer: Yes - Maternal Grandparents; Diabetes: Yes - Mother; Heart Disease: Yes - Mother,Father,Siblings,Maternal Grandparents; Hypertension: Yes - Mother,Father,Siblings; Kidney Disease: Yes - Mother; Seizures: Yes - Siblings; Current every day smoker - 0.25 pack a day; Alcohol Use: Never; Drug Use: No History; Caffeine Use: Never; Financial Concerns: No; Food, Clothing or Shelter Needs: No; Support System Lacking: No; Transportation Concerns: No Electronic Signature(s) Signed: 10/20/2021 3:41:21 PM By: Rhae Hammock RN Signed: 10/20/2021 3:51:23 PM By: Kalman Shan DO Entered By: Kalman Shan on 10/20/2021 13:31:49 -------------------------------------------------------------------------------- SuperBill Details Patient Name: Date of Service: Faith [name] 10/20/2021 Medical Record Number: [id number] Patient Account Number: [id number] Date of Birth/Sex: Treating RN: 1955-09-21 (66 y.o. Faith [name], [name] Primary Care Provider: Janifer Adie Other Clinician: Referring Provider: Treating Provider/Extender: Idelia Salm in Treatment: 6 Diagnosis Coding ICD-10 Codes Code Description [phone] Pressure ulcer of sacral region, stage 4 G35 Multiple sclerosis J44.9 Chronic obstructive pulmonary disease, unspecified Z72.0 Tobacco use M46.28 Osteomyelitis of vertebra, sacral and sacrococcygeal region Facility Procedures CPT4 Code: [id number] Description: 99214 - WOUND CARE VISIT-LEV 4 EST PT Modifier: Quantity: 1 Physician Procedures : CPT4 Code Description Modifier [id number] - WC PHYS LEVEL 3 - EST PT ICD-10 Diagnosis Description L89.154 Pressure ulcer of sacral region, stage 4 G35 Multiple sclerosis J44.9 Chronic obstructive pulmonary disease, unspecified M46.28 Osteomyelitis  of vertebra, sacral and sacrococcygeal region Quantity: 1 Electronic  Signature(s) Signed: 10/20/2021 3:51:23 PM By: Kalman Shan DO Entered By: Kalman Shan on 10/20/2021 13:42:43

## 2021-10-22 ENCOUNTER — Ambulatory Visit: Payer: Medicare Other | Admitting: Neurology

## 2021-12-02 ENCOUNTER — Encounter (HOSPITAL_COMMUNITY): Payer: Self-pay

## 2021-12-02 ENCOUNTER — Emergency Department (HOSPITAL_COMMUNITY)
Admission: EM | Admit: 2021-12-02 | Discharge: 2021-12-02 | Disposition: A | Discharge status: Home / Self Care | Payer: Medicare Other | Attending: Emergency Medicine | Admitting: Emergency Medicine

## 2021-12-02 ENCOUNTER — Other Ambulatory Visit: Payer: Self-pay

## 2021-12-02 ENCOUNTER — Emergency Department (HOSPITAL_COMMUNITY): Payer: Medicare Other

## 2021-12-02 DIAGNOSIS — R42 Dizziness and giddiness: Secondary | ICD-10-CM | POA: Diagnosis present

## 2021-12-02 DIAGNOSIS — I10 Essential (primary) hypertension: Secondary | ICD-10-CM | POA: Diagnosis not present

## 2021-12-02 DIAGNOSIS — Z79899 Other long term (current) drug therapy: Secondary | ICD-10-CM | POA: Diagnosis not present

## 2021-12-02 HISTORY — DX: Multiple sclerosis: G35

## 2021-12-02 HISTORY — DX: Multiple sclerosis, unspecified: G35.D

## 2021-12-02 LAB — CBC WITH DIFFERENTIAL/PLATELET
Abs Immature Granulocytes: 0.07 10*3/uL (ref 0.00–0.07)
Basophils Absolute: 0.1 10*3/uL (ref 0.0–0.1)
Basophils Relative: 0 %
Eosinophils Absolute: 0.5 10*3/uL (ref 0.0–0.5)
Eosinophils Relative: 4 %
HCT: 37.9 % (ref 36.0–46.0)
Hemoglobin: 12.4 g/dL (ref 12.0–15.0)
Immature Granulocytes: 1 %
Lymphocytes Relative: 6 %
Lymphs Abs: 0.8 10*3/uL (ref 0.7–4.0)
MCH: 30.5 pg (ref 26.0–34.0)
MCHC: 32.7 g/dL (ref 30.0–36.0)
MCV: 93.3 fL (ref 80.0–100.0)
Monocytes Absolute: 0.9 10*3/uL (ref 0.1–1.0)
Monocytes Relative: 7 %
Neutro Abs: 11.6 10*3/uL — ABNORMAL HIGH (ref 1.7–7.7)
Neutrophils Relative %: 82 %
Platelets: 217 10*3/uL (ref 150–400)
RBC: 4.06 MIL/uL (ref 3.87–5.11)
RDW: 19.5 % — ABNORMAL HIGH (ref 11.5–15.5)
WBC: 13.9 10*3/uL — ABNORMAL HIGH (ref 4.0–10.5)
nRBC: 0 % (ref 0.0–0.2)

## 2021-12-02 LAB — COMPREHENSIVE METABOLIC PANEL
ALT: 22 U/L (ref 0–44)
AST: 25 U/L (ref 15–41)
Albumin: 3.6 g/dL (ref 3.5–5.0)
Alkaline Phosphatase: 72 U/L (ref 38–126)
Anion gap: 7 (ref 5–15)
BUN: 14 mg/dL (ref 8–23)
CO2: 28 mmol/L (ref 22–32)
Calcium: 9.1 mg/dL (ref 8.9–10.3)
Chloride: 103 mmol/L (ref 98–111)
Creatinine, Ser: 0.69 mg/dL (ref 0.44–1.00)
GFR, Estimated: >60 mL/min (ref >60)
Glucose, Bld: 78 mg/dL (ref 70–99)
Potassium: 3.7 mmol/L (ref 3.5–5.1)
Sodium: 138 mmol/L (ref 135–145)
Total Bilirubin: 0.4 mg/dL (ref 0.3–1.2)
Total Protein: 7.1 g/dL (ref 6.5–8.1)

## 2021-12-02 LAB — URINALYSIS, ROUTINE W REFLEX MICROSCOPIC
Bilirubin Urine: NEGATIVE
Glucose, UA: NEGATIVE mg/dL
Hgb urine dipstick: NEGATIVE
Ketones, ur: NEGATIVE mg/dL
Leukocytes,Ua: NEGATIVE
Nitrite: NEGATIVE
Protein, ur: NEGATIVE mg/dL
Specific Gravity, Urine: 1.011 (ref 1.005–1.030)
pH: 6 (ref 5.0–8.0)

## 2021-12-02 MED ORDER — SODIUM CHLORIDE 0.9 % IV BOLUS
1000.0000 mL | Freq: Once | INTRAVENOUS | Status: AC
  Administered 2021-12-02: 1000 mL via INTRAVENOUS

## 2021-12-02 NOTE — ED Notes (Signed)
Patient has deubitis ulcer to sacrrum that is properly healing and being followed outpatient. Dressing changed and mepaplex applied. Patient also had purewick applied and new pants and brief.

## 2021-12-02 NOTE — Discharge Instructions (Signed)
Make sure you are drinking plenty of fluids to avoid dehydration.  Your labs are normal today.  Your blood pressure was initially low upon arrival here today making me concerned that your new metoprolol may be dropping your blood pressure to a point that is making you symptomatic.  I strongly recommend calling your primary doctor to discuss today's visit and your recognized increased tremor from switching to this medication from your propranolol.  A simple switch back to this medicine may help you feel better, I recommend discussing this with Dr. Bradd Burner.

## 2021-12-02 NOTE — ED Triage Notes (Signed)
Patient states she was sitting up and drinking her coffee PTA Patient states dizziness lasted about 10-15 minutes and then stopped before she called 911. Patient has hx of MS and is wheelchair bound. Patient states she recently switched medicines this week and thinks this may have caused her dizziness. Patient alert & oriented X3.

## 2021-12-02 NOTE — ED Provider Notes (Signed)
[facility] EMERGENCY DEPARTMENT Provider Note   CSN: [id number] Arrival date & time: 12/02/21  6720     History  Chief Complaint  Patient presents with   Dizziness    Faith [name] is a 66 y.o. female with a history significant for MS who is wheelchair dependent but can stand and pivot with a walker, hypertension, history of sacral osteomyelitis with a sacral decubitus, currently receiving wound care daily, presenting with a near syncopal event prior to arrival.  She was sitting in her chair drinking coffee when she felt lightheaded, she knocked her cell phone off of the table and bent over to try and pick it up and had a near syncopal event.  She reports she has been dehydrated in the past and had similar symptoms, but also states she is drinks plenty of fluids and has no reason for being dehydrated today.  She denies chest pain, palpitations, shortness of breath, diaphoresis, headache, also no nausea or vomiting, abdominal pain, diarrhea, dysuria.  She has seen her primary provider within the last week and states her propranolol was changed to metoprolol and she has noticed increased tremor on this new medication.  She is unsure of the prior propranolol dose.  It is noted that she is hypotensive here with a blood pressure of 93/65.  She denies history of hypotension.  The history is provided by the patient.       Home Medications Prior to Admission medications   Medication Sig Start Date End Date Taking? Authorizing Provider  acetaminophen (TYLENOL) 650 MG CR tablet Take 1,300 mg by mouth in the morning and at bedtime. Take 2 tablets by mouth BID for chronic pain   Yes [provider]  atorvastatin (LIPITOR) 20 MG tablet Take 20 mg by mouth daily.   Yes [provider]  Cholecalciferol (VITAMIN D3) 50 MCG (2000 UT) capsule Take 2,000 Units by mouth daily. 02/11/21  Yes [provider]  diazepam (VALIUM) 5 MG tablet Take 1 tablet (5 mg total) by mouth See admin  instructions. TAKE (1) TABLET BY MOUTH EVERY SIX HOURS AS NEEDED FOR MUSCLE SPASMS. Patient taking differently: Take 5 mg by mouth 3 (three) times daily before meals. Take 1 tablet TID before meals to make eating easier and for less tremors. 02/17/21  Yes Eulogio Bear U, DO  gabapentin (NEURONTIN) 300 MG capsule Take 2 capsules (600 mg total) by mouth 3 (three) times daily. 02/05/19  Yes Angiulli, Lavon Paganini, PA-C  HYDROcodone-acetaminophen (NORCO/VICODIN) 5-325 MG tablet Take 1 tablet by mouth every 6 (six) hours as needed for moderate pain. 02/17/21  Yes Eulogio Bear U, DO  metoprolol succinate (TOPROL-XL) 50 MG 24 hr tablet Take 50 mg by mouth at bedtime. Take 1 tablet by mouth at bedtime for tremor   Yes [provider]  ascorbic acid (VITAMIN C) 500 MG tablet Take 1 tablet (500 mg total) by mouth daily. Patient not taking: Reported on 12/02/2021 02/18/21   Geradine Girt, DO  baclofen (LIORESAL) 10 MG tablet Take 1 tablet (10 mg total) by mouth 3 (three) times daily. Patient not taking: Reported on 12/02/2021 02/17/21   Geradine Girt, DO  celecoxib (CELEBREX) 200 MG capsule Take 1 capsule (200 mg total) by mouth 2 (two) times daily as needed. Patient not taking: Reported on 12/02/2021 04/15/21   Shelly Coss, MD  chlorhexidine (PERIDEX) 0.12 % solution Use as directed 15 mLs in the mouth or throat 4 (four) times daily. Patient not taking: Reported  on 12/02/2021    [provider]  feeding supplement (ENSURE ENLIVE / ENSURE PLUS) LIQD Take 237 mLs by mouth 3 (three) times daily between meals. Patient not taking: Reported on 12/02/2021 12/12/20   Alma Friendly, MD  sodium hypochlorite (DAKIN'S 1/4 STRENGTH) 0.125 % SOLN Irrigate with as directed 2 (two) times daily at 10 AM and 5 PM. Patient not taking: Reported on 12/02/2021 09/22/21   Deatra James, MD  zinc sulfate 220 (50 Zn) MG capsule Take 1 capsule (220 mg total) by mouth daily. Patient not taking: Reported on  12/02/2021 02/18/21   Geradine Girt, DO      Allergies    Darvon [propoxyphene] and Penicillins    Review of Systems   Review of Systems  Constitutional:  Negative for chills and fever.  HENT:  Negative for congestion and sore throat.   Eyes: Negative.   Respiratory:  Negative for chest tightness and shortness of breath.   Cardiovascular:  Negative for chest pain, palpitations and leg swelling.  Gastrointestinal:  Negative for abdominal pain and nausea.  Genitourinary: Negative.   Musculoskeletal:  Negative for arthralgias, joint swelling and neck pain.  Skin: Negative.  Negative for rash and wound.  Neurological:  Positive for light-headedness. Negative for dizziness, weakness, numbness and headaches.  Psychiatric/Behavioral: Negative.      Physical Exam Updated Vital Signs BP 122/78   Pulse 82   Temp 97.7 F (36.5 C) (Oral)   Resp 20   Ht '5\' 3"'  (1.6 m)   Wt 43.1 kg   SpO2 100%   BMI 16.83 kg/m  Physical Exam Vitals and nursing note reviewed.  Constitutional:      Appearance: She is well-developed.  HENT:     Head: Normocephalic and atraumatic.  Eyes:     Conjunctiva/sclera: Conjunctivae normal.  Cardiovascular:     Rate and Rhythm: Normal rate and regular rhythm.     Heart sounds: Normal heart sounds.  Pulmonary:     Effort: Pulmonary effort is normal.     Breath sounds: Normal breath sounds. No wheezing.  Abdominal:     General: Bowel sounds are normal.     Palpations: Abdomen is soft.     Tenderness: There is no abdominal tenderness.  Musculoskeletal:        General: Normal range of motion.     Cervical back: Normal range of motion.  Skin:    General: Skin is warm and dry.  Neurological:     Mental Status: She is alert.     ED Results / Procedures / Treatments   Labs (all labs ordered are listed, but only abnormal results are displayed) Labs Reviewed  CBC WITH DIFFERENTIAL/PLATELET - Abnormal; Notable for the following components:      Result  Value   WBC 13.9 (*)    RDW 19.5 (*)    Neutro Abs 11.6 (*)    All other components within normal limits  COMPREHENSIVE METABOLIC PANEL  URINALYSIS, ROUTINE W REFLEX MICROSCOPIC    EKG EKG Interpretation  Date/Time:  Wednesday December 02 2021 11:28:49 EDT Ventricular Rate:  78 PR Interval:  69 QRS Duration: 84 QT Interval:  385 QTC Calculation: 439 R Axis:   72 Text Interpretation: Sinus rhythm Right atrial enlargement Artifact in lead(s) I II aVR V1 Poor data quality in current ECG precludes serial comparison Confirmed by Dorie Rank [id number] on 12/02/2021 11:37:16 AM  Radiology DG Chest Portable 1 View  Result Date: 12/02/2021 CLINICAL DATA:  Near  syncope EXAM: PORTABLE CHEST 1 VIEW COMPARISON:  09/19/2021 FINDINGS: Normal heart size and mediastinal contours. Hyperinflation and apical lucency. No acute infiltrate or edema. No effusion or pneumothorax. No acute osseous findings. IMPRESSION: COPD without acute superimposed finding. Electronically Signed   By: Jorje Guild M.D.   On: 12/02/2021 11:26    Procedures Procedures    Medications Ordered in ED Medications  sodium chloride 0.9 % bolus 1,000 mL (0 mLs Intravenous Stopped 12/02/21 1323)  sodium chloride 0.9 % bolus 1,000 mL (1,000 mLs Intravenous New Bag/Given 12/02/21 1747)    ED Course/ Medical Decision Making/ A&P                           Medical Decision Making Patient with an episode of lightheadedness which has resolved by the time of arrival.  She also describes worsening tremor since being switched from propranolol to metoprolol 1 week ago.  Her exam and labs today are reassuring, she tolerated p.o. intake here, she was given IV fluids as well.  She initially presented with a low blood pressure which has significantly improved here.  I have a suspicion her metoprolol may be the source of her lightheadedness.  She may better tolerate propranolol, she does not know and it is unclear why the switch from propranolol  to metoprolol was made, however she was advised to contact her primary provider to discuss today's symptoms and whether she should have her medicines adjusted.  Amount and/or Complexity of Data Reviewed Labs: ordered.    Details: C-Met and urinalysis are normal.  She does have an elevated WBC count at 13.9 of unclear etiology.  She has no symptoms to suggest an infection, her urine is clear, chest x-ray is clear. Radiology: ordered and independent interpretation performed.    Details: Chest x-ray clear ECG/medicine tests: ordered.    Details: Sinus rhythm           Final Clinical Impression(s) / ED Diagnoses Final diagnoses:  Lightheadedness    Rx / DC Orders ED Discharge Orders     None         Landis Martins 12/02/21 1847    Dorie Rank, MD 12/03/21 [phone]

## 2021-12-02 NOTE — ED Notes (Signed)
Patient given sprite at this time and brief changed. Patient readjusted in bed.

## 2021-12-02 NOTE — ED Notes (Signed)
Discharge instructions reviewed with the patient. Patient had no questions or concerns to report. Patient discharged.  

## 2021-12-02 NOTE — ED Notes (Signed)
[facility] called this nurse and states if the patient is admitted or discharged they would give patient a ride home. Please contact [phone]

## 2021-12-02 NOTE — ED Notes (Signed)
Patient called out to be changed due to urination. Patient was changed and placed on a external urinary catheter.

## 2021-12-15 ENCOUNTER — Encounter: Payer: Self-pay | Admitting: Neurology

## 2021-12-15 ENCOUNTER — Ambulatory Visit (INDEPENDENT_AMBULATORY_CARE_PROVIDER_SITE_OTHER): Payer: Medicaid Other | Admitting: Neurology

## 2021-12-15 VITALS — BP 106/73 | HR 81

## 2021-12-15 DIAGNOSIS — Z79899 Other long term (current) drug therapy: Secondary | ICD-10-CM

## 2021-12-15 DIAGNOSIS — R269 Unspecified abnormalities of gait and mobility: Secondary | ICD-10-CM | POA: Diagnosis not present

## 2021-12-15 DIAGNOSIS — G959 Disease of spinal cord, unspecified: Secondary | ICD-10-CM | POA: Diagnosis not present

## 2021-12-15 DIAGNOSIS — G35 Multiple sclerosis: Secondary | ICD-10-CM | POA: Diagnosis not present

## 2021-12-15 DIAGNOSIS — G8220 Paraplegia, unspecified: Secondary | ICD-10-CM

## 2021-12-15 NOTE — Progress Notes (Signed)
[facility]  PATIENT: Faith [name] DOB: [birth year]  REFERRING DOCTOR OR PCP: Barney Drain, MD SOURCE: Patient, notes from PCP, notes from Dr. Merlene Laughter, note from recent hospital stay, imaging and lab reports, MRI images personally reviewed  _________________________________   HISTORICAL  CHIEF COMPLAINT:  Chief Complaint  Patient presents with   Follow-up    Pt in room #1 and alone. Pt here today for f/u with her MS.     HISTORY OF PRESENT ILLNESS:  Faith [name] is a 66 y.o. woman with multiple sclerosis.  Update 12/15/2021: Faith [name] took [facility] March and April (4 pills each of the cycles).  Faith [name] had lymphocytes 0.6 on 7/22.2023 and 0.8 12/02/2021.  Faith [name] does not feel any effect of the [place].  Faith [name] did not have any trouble tolerating it.  Faith [name] does note that her neck strength has slowly worsened this year..   If Faith [name] has only been treated for a few years, on Tysabri for about 2 years and receiving 1 dose of Ocrevus.  Faith [name] has not been on any medication for the past 3 years.  Faith [name] spends most of her time in the wheelchair.  Faith [name] is not able to take 2 or 3 steps with a walker.   Both legs are weak.   Faith [name] notes numbness in both legs.   Arms are good strength.    Faith [name] is on baclofen for spasticity, propranolol for tremor and gabapentin for dysesthesias.  Bladder function is fine.  Faith [name] has a sacral decubitus that is being followed by wound management and is expected to be healed by the end of the year.  Vision is poor (bilateral).     Cognition is doing well.    Faith [name] denies faigue in the mornings and does simpe chores then.   Faith [name] has more fatigue later in the day.     Faith [name] denies depression.   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      MS History: Faith [name] was diagnosed with MS in 2007 after presenting with gradual worsening of gait.  Faith [name] had noted progressive worsening of gait before that year but the change seemed more rapid at the time.  Two to three years earlier  Faith [name] had no major disability.    Faith [name] saw Dr. Merlene Laughter in [place] who diagnosed er with MS.   Faith [name] received IV steroids.   Faith [name] was placed on Tysabri and felt Faith [name] was stable x 2 years.   However, due to JCV it was stopped.   Faith [name] was switched to [place] but felt very poorly ad feverish.   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   Faith [name] did not resume ay medication after that one dose.    Faith [name] feels Faith [name] worsened after the surgery,  Faith [name] was using a walker before then and needed a wheelchair afterwards.  Faith [name] had her first course of [place] March and April 2023.  Faith [name] tolerated it well.   Imaging personally reviewed: MRI of the brain 02/29/2020 shows scattered T2/FLAIR hyperintense foci in the hemispheres consistent with MS.  None of the foci enhanced.  There was mild atrophy.  Only 1 infratentorial focus in the right posterior pons.  MRI of the cervical spine 02/29/2020 showed a T2 hyperintense focus posteriorly towards the right adjacent to C4.  Faith [name] also has fusion with posterior fixation at C3-C4 and foraminal narrowing to the left involving C7, to the left at C3 and bilaterally at C6.  The effusion  resolved the spinal stenosis with possible myelopathy noted on her previous MRI  MRI of the thoracic spine 03/01/2020 showed multiple T2 hyperintense foci within the spinal cord most defined at T7, T10 and T11..  There was no enhancement.  No significant degenerative change.  REVIEW OF SYSTEMS: Constitutional: No fevers, chills, sweats, or change in appetite Eyes: No visual changes, double vision, eye pain Ear, nose and throat: No hearing loss, ear pain, nasal congestion, sore throat Cardiovascular: No chest pain, palpitations Respiratory:  No shortness of breath at rest or with exertion.   No wheezes GastrointestinaI: No nausea, vomiting, diarrhea, abdominal pain, fecal incontinence Genitourinary:  No dysuria, urinary retention or frequency.  No nocturia. Musculoskeletal:  No neck pain,  back pain Integumentary: No rash, pruritus, skin lesions Neurological: as above Psychiatric: No depression at this time.  No anxiety Endocrine: No palpitations, diaphoresis, change in appetite, change in weigh or increased thirst Hematologic/Lymphatic: Faith [name] has had anemia.. Allergic/Immunologic: No itchy/runny eyes, nasal congestion, recent allergic reactions, rashes  ALLERGIES: Allergies  Allergen Reactions   Darvon [Propoxyphene] Other (See Comments)    Hallicuations   Penicillins Rash    Did it involve swelling of the face/tongue/throat, SOB, or low BP? Unknown Did it involve sudden or severe rash/hives, skin peeling, or any reaction on the inside of your mouth or nose? Unknown Did you need to seek medical attention at a hospital or doctor's office? Unknown When did it last happen?      Unknown If all above answers are "NO", may proceed with cephalosporin use.     HOME MEDICATIONS:  Current Outpatient Medications:    acetaminophen (TYLENOL) 650 MG CR tablet, Take 1,300 mg by mouth in the morning and at bedtime. Take 2 tablets by mouth BID for chronic pain, Disp: , Rfl:    atorvastatin (LIPITOR) 20 MG tablet, Take 20 mg by mouth daily., Disp: , Rfl:    Cholecalciferol (VITAMIN D3) 50 MCG (2000 UT) capsule, Take 2,000 Units by mouth daily., Disp: , Rfl:    diazepam (VALIUM) 5 MG tablet, Take 1 tablet (5 mg total) by mouth See admin instructions. TAKE (1) TABLET BY MOUTH EVERY SIX HOURS AS NEEDED FOR MUSCLE SPASMS., Disp: 10 tablet, Rfl: 0   feeding supplement (ENSURE ENLIVE / ENSURE PLUS) LIQD, Take 237 mLs by mouth 3 (three) times daily between meals., Disp: 237 mL, Rfl: 0   gabapentin (NEURONTIN) 300 MG capsule, Take 2 capsules (600 mg total) by mouth 3 (three) times daily., Disp: 180 capsule, Rfl: 1   HYDROcodone-acetaminophen (NORCO/VICODIN) 5-325 MG tablet, Take 1 tablet by mouth every 6 (six) hours as needed for moderate pain., Disp: 10 tablet, Rfl: 0   metoprolol succinate  (TOPROL-XL) 50 MG 24 hr tablet, Take 50 mg by mouth at bedtime. Take 1 tablet by mouth at bedtime for tremor, Disp: , Rfl:    ascorbic acid (VITAMIN C) 500 MG tablet, Take 1 tablet (500 mg total) by mouth daily. (Patient not taking: Reported on 12/15/2021), Disp: , Rfl:    baclofen (LIORESAL) 10 MG tablet, Take 1 tablet (10 mg total) by mouth 3 (three) times daily. (Patient not taking: Reported on 12/15/2021), Disp: 10 each, Rfl: 0   celecoxib (CELEBREX) 200 MG capsule, Take 1 capsule (200 mg total) by mouth 2 (two) times daily as needed. (Patient not taking: Reported on 12/15/2021), Disp: 60 capsule, Rfl: 2   chlorhexidine (PERIDEX) 0.12 % solution, Use as directed 15 mLs in the mouth or throat 4 (four) times daily. (  Patient not taking: Reported on 12/15/2021), Disp: , Rfl:    sodium hypochlorite (DAKIN'S 1/4 STRENGTH) 0.125 % SOLN, Irrigate with as directed 2 (two) times daily at 10 AM and 5 PM. (Patient not taking: Reported on 12/15/2021), Disp: 473 mL, Rfl: 0   zinc sulfate 220 (50 Zn) MG capsule, Take 1 capsule (220 mg total) by mouth daily. (Patient not taking: Reported on 12/15/2021), Disp: , Rfl:   PAST MEDICAL HISTORY: Past Medical History:  Diagnosis Date   Anemia    my whole life, Used to take iron   Arthritis    spine, lumbar   COPD (chronic obstructive pulmonary disease) ([place])    Emphysema of lung ([place])    GERD (gastroesophageal reflux disease) 04/12/2021   HTN (hypertension) 03/01/2020   Hyperlipidemia 11/28/2019   Multiple sclerosis ([place])    Narcolepsy    by history, has nightmares   Neuromuscular disorder ([place])    leg issues from spine    PAST SURGICAL HISTORY: Past Surgical History:  Procedure Laterality Date   ANTERIOR CERVICAL DECOMP/DISCECTOMY FUSION N/A 01/19/2019   Procedure: ANTERIOR CERVICAL DECOMPRESSION/DISCECTOMY Cervical three - four;  Surgeon: Ashok Pall, MD;  Location: [facility];  Service: Neurosurgery;  Laterality: N/A;   EYE SURGERY Right    ORIF FACIAL  FRACTURE     POSTERIOR CERVICAL LAMINECTOMY N/A 01/19/2019   Procedure: POSTERIOR CERVICAL LAMINECTOMY Removal of Synovial Cyst and posterior cervical fusion;  Surgeon: Ashok Pall, MD;  Location: [facility];  Service: Neurosurgery;  Laterality: N/A;   TEE WITHOUT CARDIOVERSION N/A 03/17/2020   Procedure: TRANSESOPHAGEAL ECHOCARDIOGRAM (TEE) WITH PROPOFOL;  Surgeon: Satira Sark, MD;  [facility];  Service: Cardiovascular;  Laterality: N/A;   TUBAL LIGATION     tubaligation      FAMILY HISTORY: Family History  Problem Relation Age of Onset   Heart attack Mother    Kidney failure Mother    Diabetes Mother    Heart disease Mother    Hyperlipidemia Mother    Hypertension Mother    Heart attack Father 79   Hypertension Father    Hypertension Sister    Diabetes Sister    Other Brother        house fire   Hypertension Brother    Seizures Brother    Heart disease Brother        mitral valve   Arthritis Daughter        DJD, AS fibromyalgia   Polymyositis Daughter    Cancer Maternal Grandfather        lung   Heart disease Maternal Grandfather    Other Paternal Grandmother        house fire   Alcohol abuse Son     SOCIAL HISTORY:  Social History   Socioeconomic History   Marital status: Single    Spouse name: Not on file   Number of children: 3   Years of education: 12   Highest education level: Not on file  Occupational History   Occupation: disabled    Comment: house painting  Tobacco Use   Smoking status: Every Day    Packs/day: 0.25    Years: 45.00    Total pack years: 11.25    Types: Cigarettes   Smokeless tobacco: Never   Tobacco comments:    cutting down  Vaping Use   Vaping Use: Never used  Substance and Sexual Activity   Alcohol use: Not Currently    Alcohol/week: 1.0 standard drink of alcohol  Types: 1 Cans of beer per week   Drug use: No   Sexual activity: Not Currently    Birth control/protection: Post-menopausal  Other Topics  Concern   Not on file  Social History Narrative   Patient lives with her son. [name], age 16, frequently visits   Social Determinants of Health   Financial Resource Strain: Not on file  Food Insecurity: Not on file  Transportation Needs: Not on file  Physical Activity: Not on file  Stress: Not on file  Social Connections: Not on file  Intimate Partner Violence: Not on file     PHYSICAL EXAM  Vitals:   12/15/21 1052  BP: 106/73  Pulse: 81    There is no height or weight on file to calculate BMI.   General: The patient is well-developed and well-nourished and in no acute distress  HEENT:  Head is [place]/AT.  Sclera are anicteric.        Neurologic Exam  Mental status: The patient is alert and oriented x 3 at the time of the examination. The patient has apparent normal recent and remote memory, with an apparently normal attention span and concentration ability.   Speech is normal.  Cranial nerves: Extraocular movements are full. Pupils are equal, round, and reactive to light and accomodation.  Visual fields are full.  Facial symmetry is present. There is good facial sensation to soft touch bilaterally.Facial strength is normal.  Trapezius and sternocleidomastoid strength is normal. No dysarthria is noted.  Hearing was symmetric.  Motor:  Muscle bulk is normal.   Tone is normal. Strength is  4+ to 5 / 5 in arms (better on right) and 4-/5 both iliopsoas and feet and 4/5 in quads.  Sensory: Sensory testing shows reduced vibration in right hand , mild reduced vibration in both legs symmetrically  Touch is stronger on her left than the right.  Legs more symmetric.    Coordination: Cerebellar testing reveals good left and mildly reduced right finger-nose-finger and poor heel-to-shin in the setting of leg weakness.  Gait and station: Faith [name] cannot stand without strong bilateral support.  Reflexes: Deep tendon reflexes are symmetric and normal in arms but increased in legs  (crossed adductors and clonus, multiple beats).      DIAGNOSTIC DATA (LABS, IMAGING, TESTING) - I reviewed patient records, labs, notes, testing and imaging myself where available.  Lab Results  Component Value Date   WBC 13.9 (H) 12/02/2021   HGB 12.4 12/02/2021   HCT 37.9 12/02/2021   MCV 93.3 12/02/2021   PLT 217 12/02/2021      Component Value Date/Time   NA 138 12/02/2021 1133   K 3.7 12/02/2021 1133   CL 103 12/02/2021 1133   CO2 28 12/02/2021 1133   GLUCOSE 78 12/02/2021 1133   BUN 14 12/02/2021 1133   CREATININE 0.69 12/02/2021 1133   CREATININE 0.53 11/28/2019 1339   CALCIUM 9.1 12/02/2021 1133   PROT 7.1 12/02/2021 1133   ALBUMIN 3.6 12/02/2021 1133   AST 25 12/02/2021 1133   ALT 22 12/02/2021 1133   ALKPHOS 72 12/02/2021 1133   BILITOT 0.4 12/02/2021 1133   GFRNONAA >60 12/02/2021 1133   GFRNONAA 101 11/28/2019 1339   GFRAA 117 11/28/2019 1339   Lab Results  Component Value Date   CHOL 199 11/28/2019   HDL 87 11/28/2019   LDLCALC 97 11/28/2019   TRIG 65 11/28/2019   CHOLHDL 2.3 11/28/2019   No results found for: "HGBA1C" Lab Results  Component Value  Date   [id number] [id number] 03/01/2020   Lab Results  Component Value Date   TSH 1.366 09/20/2021       ASSESSMENT AND PLAN  Multiple sclerosis ([place])  High risk medication use  Cervical myelopathy ([facility])  Gait disturbance  Diplegia of both lower extremities ([place])  Faith [name] completed the first year of [place] without difficulty.  Lymphocyte count is now back in the normal range.  Unfortunately, Faith [name] has noted continued slow progression.  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 I would like her to do the second year in March/April 2024.  I would consider one of the BTK inhibitors when they become approved as they may have more benefit for secondary progressive MS. Faith [name] will return to see me in 5 months and we will check blood work at that time  including the chronic infections in preparation for the year 2 oh [place].  Faith [name] should call sooner if new or worsening neurologic symptoms.     Starleen Trussell A. Felecia Shelling, MD, [facility] 60/63/0160, 1:09 PM Certified in Neurology, Clinical Neurophysiology, Sleep Medicine and Neuroimaging  [facility] [address], [place], [place] [address] [phone]

## 2021-12-21 ENCOUNTER — Other Ambulatory Visit: Payer: Self-pay

## 2021-12-21 ENCOUNTER — Ambulatory Visit (INDEPENDENT_AMBULATORY_CARE_PROVIDER_SITE_OTHER): Payer: Medicare Other | Admitting: Infectious Disease

## 2021-12-21 DIAGNOSIS — I739 Peripheral vascular disease, unspecified: Secondary | ICD-10-CM | POA: Diagnosis not present

## 2021-12-21 DIAGNOSIS — D72829 Elevated white blood cell count, unspecified: Secondary | ICD-10-CM | POA: Diagnosis not present

## 2021-12-21 DIAGNOSIS — R42 Dizziness and giddiness: Secondary | ICD-10-CM | POA: Diagnosis not present

## 2021-12-21 DIAGNOSIS — G35 Multiple sclerosis: Secondary | ICD-10-CM | POA: Diagnosis not present

## 2021-12-21 DIAGNOSIS — M4628 Osteomyelitis of vertebra, sacral and sacrococcygeal region: Secondary | ICD-10-CM | POA: Diagnosis present

## 2021-12-21 DIAGNOSIS — M4710 Other spondylosis with myelopathy, site unspecified: Secondary | ICD-10-CM | POA: Diagnosis not present

## 2021-12-21 NOTE — Progress Notes (Signed)
Subjective:  Chief complaint follow-up for deep sacral decubitus ulcer  Patient ID: Faith [name], female    DOB: Jan 17, 1956, 66 y.o.   MRN: [id number]  HPI  with history of multiple sclerosis bedbound JC virus who was admitted to the hospital fever tachycardia hypotension.  Blood cultures were taken which failed to reveal any organism.  CT scan of the pelvis showed some mild amount of cortical structure and which was equivocal for acute osteomyelitis.   I consulted with her remotely using telehealth while she was at [facility] and we decided to treat with oral antibiotics in the form of doxycycline and Augmentin.  She followed up with wound care in August and was doing well.  She continues to [facility].  She does not have chills nausea or other systemic symptoms to suggest active infection she has not had worsening drainage from the wound.  She has been told that the wound is doing well and that will potentially heal up in the next couple of months.  She was seen in the ER recently with some dizziness but this was attributed to a change in her beta-blocker.    Past Medical History:  Diagnosis Date   Anemia    my whole life, Used to take iron   Arthritis    spine, lumbar   COPD (chronic obstructive pulmonary disease) (HCC)    Emphysema of lung ([place])    GERD (gastroesophageal reflux disease) 04/12/2021   HTN (hypertension) 03/01/2020   Hyperlipidemia 11/28/2019   Multiple sclerosis ([place])    Narcolepsy    by history, has nightmares   Neuromuscular disorder ([place])    leg issues from spine    Past Surgical History:  Procedure Laterality Date   ANTERIOR CERVICAL DECOMP/DISCECTOMY FUSION N/A 01/19/2019   Procedure: ANTERIOR CERVICAL DECOMPRESSION/DISCECTOMY Cervical three - four;  Surgeon: Ashok Pall, MD;  Location: [place];  Service: Neurosurgery;  Laterality: N/A;   EYE SURGERY Right    ORIF FACIAL FRACTURE     POSTERIOR CERVICAL LAMINECTOMY N/A  01/19/2019   Procedure: POSTERIOR CERVICAL LAMINECTOMY Removal of Synovial Cyst and posterior cervical fusion;  Surgeon: Ashok Pall, MD;  Location: [place];  Service: Neurosurgery;  Laterality: N/A;   TEE WITHOUT CARDIOVERSION N/A 03/17/2020   Procedure: TRANSESOPHAGEAL ECHOCARDIOGRAM (TEE) WITH PROPOFOL;  Surgeon: Satira Sark, MD;  [facility];  Service: Cardiovascular;  Laterality: N/A;   TUBAL LIGATION     tubaligation      Family History  Problem Relation Age of Onset   Heart attack Mother    Kidney failure Mother    Diabetes Mother    Heart disease Mother    Hyperlipidemia Mother    Hypertension Mother    Heart attack Father 28   Hypertension Father    Hypertension Sister    Diabetes Sister    Other Brother        house fire   Hypertension Brother    Seizures Brother    Heart disease Brother        mitral valve   Arthritis Daughter        DJD, AS fibromyalgia   Polymyositis Daughter    Cancer Maternal Grandfather        lung   Heart disease Maternal Grandfather    Other Paternal Grandmother        house fire   Alcohol abuse Son       Social History   Socioeconomic History  Marital status: Single    Spouse name: Not on file   Number of children: 3   Years of education: 72   Highest education level: Not on file  Occupational History   Occupation: disabled    Comment: house painting  Tobacco Use   Smoking status: Every Day    Packs/day: 0.25    Years: 45.00    Total pack years: 11.25    Types: Cigarettes   Smokeless tobacco: Never   Tobacco comments:    cutting down  Vaping Use   Vaping Use: Never used  Substance and Sexual Activity   Alcohol use: Not Currently    Alcohol/week: 1.0 standard drink of alcohol    Types: 1 Cans of beer per week   Drug use: No   Sexual activity: Not Currently    Birth control/protection: Post-menopausal  Other Topics Concern   Not on file  Social History Narrative   Patient lives with her son.  [name], age 20, frequently visits   Social Determinants of Health   Financial Resource Strain: Not on file  Food Insecurity: Not on file  Transportation Needs: Not on file  Physical Activity: Not on file  Stress: Not on file  Social Connections: Not on file    Allergies  Allergen Reactions   Darvon [Propoxyphene] Other (See Comments)    Hallicuations   Penicillins Rash    Did it involve swelling of the face/tongue/throat, SOB, or low BP? Unknown Did it involve sudden or severe rash/hives, skin peeling, or any reaction on the inside of your mouth or nose? Unknown Did you need to seek medical attention at a hospital or doctor's office? Unknown When did it last happen?      Unknown If all above answers are "NO", may proceed with cephalosporin use.      Current Outpatient Medications:    acetaminophen (TYLENOL) 650 MG CR tablet, Take 1,300 mg by mouth in the morning and at bedtime. Take 2 tablets by mouth BID for chronic pain, Disp: , Rfl:    ascorbic acid (VITAMIN C) 500 MG tablet, Take 1 tablet (500 mg total) by mouth daily. (Patient not taking: Reported on 12/15/2021), Disp: , Rfl:    atorvastatin (LIPITOR) 20 MG tablet, Take 20 mg by mouth daily., Disp: , Rfl:    baclofen (LIORESAL) 10 MG tablet, Take 1 tablet (10 mg total) by mouth 3 (three) times daily. (Patient not taking: Reported on 12/15/2021), Disp: 10 each, Rfl: 0   celecoxib (CELEBREX) 200 MG capsule, Take 1 capsule (200 mg total) by mouth 2 (two) times daily as needed. (Patient not taking: Reported on 12/15/2021), Disp: 60 capsule, Rfl: 2   chlorhexidine (PERIDEX) 0.12 % solution, Use as directed 15 mLs in the mouth or throat 4 (four) times daily. (Patient not taking: Reported on 12/15/2021), Disp: , Rfl:    Cholecalciferol (VITAMIN D3) 50 MCG (2000 UT) capsule, Take 2,000 Units by mouth daily., Disp: , Rfl:    diazepam (VALIUM) 5 MG tablet, Take 1 tablet (5 mg total) by mouth See admin instructions. TAKE (1)  TABLET BY MOUTH EVERY SIX HOURS AS NEEDED FOR MUSCLE SPASMS., Disp: 10 tablet, Rfl: 0   feeding supplement (ENSURE ENLIVE / ENSURE PLUS) LIQD, Take 237 mLs by mouth 3 (three) times daily between meals., Disp: 237 mL, Rfl: 0   gabapentin (NEURONTIN) 300 MG capsule, Take 2 capsules (600 mg total) by mouth 3 (three) times daily., Disp: 180 capsule, Rfl: 1   HYDROcodone-acetaminophen (NORCO/VICODIN) 5-325  MG tablet, Take 1 tablet by mouth every 6 (six) hours as needed for moderate pain., Disp: 10 tablet, Rfl: 0   metoprolol succinate (TOPROL-XL) 50 MG 24 hr tablet, Take 50 mg by mouth at bedtime. Take 1 tablet by mouth at bedtime for tremor, Disp: , Rfl:    sodium hypochlorite (DAKIN'S 1/4 STRENGTH) 0.125 % SOLN, Irrigate with as directed 2 (two) times daily at 10 AM and 5 PM. (Patient not taking: Reported on 12/15/2021), Disp: 473 mL, Rfl: 0   zinc sulfate 220 (50 Zn) MG capsule, Take 1 capsule (220 mg total) by mouth daily. (Patient not taking: Reported on 12/15/2021), Disp: , Rfl:     Review of Systems  Constitutional:  Negative for activity change, appetite change, chills, diaphoresis, fatigue, fever and unexpected weight change.  HENT:  Negative for congestion, rhinorrhea, sinus pressure, sneezing, sore throat and trouble swallowing.   Eyes:  Negative for photophobia and visual disturbance.  Respiratory:  Negative for cough, chest tightness, shortness of breath, wheezing and stridor.   Cardiovascular:  Negative for chest pain, palpitations and leg swelling.  Gastrointestinal:  Negative for abdominal distention, abdominal pain, anal bleeding, blood in stool, constipation, diarrhea, nausea and vomiting.  Genitourinary:  Negative for difficulty urinating, dysuria, flank pain and hematuria.  Musculoskeletal:  Negative for arthralgias, back pain, gait problem, joint swelling and myalgias.  Skin:  Positive for wound. Negative for color change, pallor and rash.  Neurological:  Positive for weakness.  Negative for dizziness, tremors and light-headedness.  Hematological:  Negative for adenopathy. Does not bruise/bleed easily.  Psychiatric/Behavioral:  Negative for agitation, behavioral problems, confusion, decreased concentration, dysphoric mood and sleep disturbance.        Objective:   Physical Exam Constitutional:      General: She is not in acute distress.    Appearance: Normal appearance. She is well-developed and underweight. She is not ill-appearing or diaphoretic.  HENT:     Head: Normocephalic and atraumatic.     Right Ear: Hearing and external ear normal.     Left Ear: Hearing and external ear normal.     Nose: No nasal deformity or rhinorrhea.  Eyes:     General: No scleral icterus.    Conjunctiva/sclera: Conjunctivae normal.     Right eye: Right conjunctiva is not injected.     Left eye: Left conjunctiva is not injected.     Pupils: Pupils are equal, round, and reactive to light.  Neck:     Vascular: No JVD.  Cardiovascular:     Rate and Rhythm: Normal rate and regular rhythm.     Heart sounds: S1 normal and S2 normal.  Pulmonary:     Effort: Pulmonary effort is normal. No respiratory distress.     Breath sounds: No wheezing.  Abdominal:     Palpations: Abdomen is soft.     Tenderness: There is no abdominal tenderness.  Musculoskeletal:        General: Normal range of motion.     Right shoulder: Normal.     Left shoulder: Normal.     Cervical back: Normal range of motion and neck supple.     Right hip: Normal.     Left hip: Normal.     Right knee: Normal.     Left knee: Normal.  Lymphadenopathy:     Head:     Right side of head: No submandibular, preauricular or posterior auricular adenopathy.     Left side of head: No submandibular, preauricular or posterior  auricular adenopathy.     Cervical: No cervical adenopathy.     Right cervical: No superficial or deep cervical adenopathy.    Left cervical: No superficial or deep cervical adenopathy.  Skin:     General: Skin is warm and dry.     Coloration: Skin is not pale.     Findings: No abrasion, bruising, ecchymosis, erythema, lesion or rash.     Nails: There is no clubbing.  Neurological:     Mental Status: She is alert and oriented to person, place, and time.     Sensory: No sensory deficit.     Coordination: Coordination normal.  Psychiatric:        Attention and Perception: She is attentive.        Mood and Affect: Mood normal.        Speech: Speech normal.        Behavior: Behavior normal. Behavior is cooperative.        Thought Content: Thought content normal.        Judgment: Judgment normal.    Sacral wound 12/21/2021:          Assessment & Plan:   Stage IV decubitus ulcer with osteomyelitis status post 6 weeks of oral antibiotics.  Wound is without evidence of clear-cut infection at this point in time.  She should continue with wound care and offloading.  Certainly would be happy to see her to develop any acute worsening of her infection.  Lightheadedness dizziness this was due to a change in beta-blockers leukocytosis: Seen on labs done in the ER is of uncertain significance.  Is not seem to be clinically infected at this point in time.  Multiple sclerosis: We will continue to follow with neurology.

## 2022-01-04 ENCOUNTER — Other Ambulatory Visit: Payer: Self-pay | Admitting: Family Medicine

## 2022-01-04 DIAGNOSIS — Z0001 Encounter for general adult medical examination with abnormal findings: Secondary | ICD-10-CM

## 2022-03-08 ENCOUNTER — Other Ambulatory Visit: Payer: Self-pay

## 2022-03-08 DIAGNOSIS — Z122 Encounter for screening for malignant neoplasm of respiratory organs: Secondary | ICD-10-CM

## 2022-03-08 DIAGNOSIS — Z87891 Personal history of nicotine dependence: Secondary | ICD-10-CM

## 2022-03-08 NOTE — Progress Notes (Signed)
Order placed for LDCT per protocol. Scan scheduled for 02/23 at 1245

## 2022-03-09 ENCOUNTER — Telehealth: Payer: Self-pay | Admitting: Neurology

## 2022-03-09 NOTE — Telephone Encounter (Signed)
Patient is not due for her second year of [place] until March 2024. She has an upcoming appointment with Dr. Felecia Shelling.

## 2022-03-09 NOTE — Telephone Encounter (Signed)
Addy is calling. Asking have you received fax for [facility] and to let the nurse know the new window has open.

## 2022-04-06 ENCOUNTER — Other Ambulatory Visit: Payer: Self-pay | Admitting: Internal Medicine

## 2022-04-06 DIAGNOSIS — Z0001 Encounter for general adult medical examination with abnormal findings: Secondary | ICD-10-CM

## 2022-04-09 ENCOUNTER — Emergency Department (HOSPITAL_COMMUNITY)
Admission: EM | Admit: 2022-04-09 | Discharge: 2022-04-09 | Disposition: A | Discharge status: Home / Self Care | Payer: Medicare Other | Attending: Emergency Medicine | Admitting: Emergency Medicine

## 2022-04-09 ENCOUNTER — Other Ambulatory Visit: Payer: Self-pay

## 2022-04-09 ENCOUNTER — Encounter (HOSPITAL_COMMUNITY): Payer: Self-pay | Admitting: Emergency Medicine

## 2022-04-09 DIAGNOSIS — G35 Multiple sclerosis: Secondary | ICD-10-CM

## 2022-04-09 DIAGNOSIS — E860 Dehydration: Secondary | ICD-10-CM | POA: Diagnosis not present

## 2022-04-09 DIAGNOSIS — I10 Essential (primary) hypertension: Secondary | ICD-10-CM | POA: Diagnosis not present

## 2022-04-09 DIAGNOSIS — F17200 Nicotine dependence, unspecified, uncomplicated: Secondary | ICD-10-CM | POA: Insufficient documentation

## 2022-04-09 DIAGNOSIS — J449 Chronic obstructive pulmonary disease, unspecified: Secondary | ICD-10-CM | POA: Insufficient documentation

## 2022-04-09 DIAGNOSIS — R531 Weakness: Secondary | ICD-10-CM | POA: Diagnosis present

## 2022-04-09 LAB — CBC WITH DIFFERENTIAL/PLATELET
Abs Immature Granulocytes: 0 10*3/uL (ref 0.00–0.07)
Band Neutrophils: 1 %
Basophils Absolute: 0 10*3/uL (ref 0.0–0.1)
Basophils Relative: 0 %
Eosinophils Absolute: 0.6 10*3/uL — ABNORMAL HIGH (ref 0.0–0.5)
Eosinophils Relative: 6 %
HCT: 37.1 % (ref 36.0–46.0)
Hemoglobin: 11.9 g/dL — ABNORMAL LOW (ref 12.0–15.0)
Lymphocytes Relative: 5 %
Lymphs Abs: 0.5 10*3/uL — ABNORMAL LOW (ref 0.7–4.0)
MCH: 31.5 pg (ref 26.0–34.0)
MCHC: 32.1 g/dL (ref 30.0–36.0)
MCV: 98.1 fL (ref 80.0–100.0)
Monocytes Absolute: 0.7 10*3/uL (ref 0.1–1.0)
Monocytes Relative: 7 %
Neutro Abs: 8.5 10*3/uL — ABNORMAL HIGH (ref 1.7–7.7)
Neutrophils Relative %: 81 %
Platelets: 291 10*3/uL (ref 150–400)
RBC: 3.78 MIL/uL — ABNORMAL LOW (ref 3.87–5.11)
RDW: 15.4 % (ref 11.5–15.5)
WBC Morphology: REACTIVE
WBC: 10.4 10*3/uL (ref 4.0–10.5)
nRBC: 0 % (ref 0.0–0.2)

## 2022-04-09 LAB — BASIC METABOLIC PANEL
Anion gap: 9 (ref 5–15)
BUN: 14 mg/dL (ref 8–23)
CO2: 29 mmol/L (ref 22–32)
Calcium: 8.9 mg/dL (ref 8.9–10.3)
Chloride: 102 mmol/L (ref 98–111)
Creatinine, Ser: 0.66 mg/dL (ref 0.44–1.00)
GFR, Estimated: >60 mL/min (ref >60)
Glucose, Bld: 87 mg/dL (ref 70–99)
Potassium: 4.1 mmol/L (ref 3.5–5.1)
Sodium: 140 mmol/L (ref 135–145)

## 2022-04-09 LAB — CBG MONITORING, ED: Glucose-Capillary: 95 mg/dL (ref 70–99)

## 2022-04-09 MED ORDER — SODIUM CHLORIDE 0.9 % IV BOLUS
1000.0000 mL | Freq: Once | INTRAVENOUS | Status: AC
  Administered 2022-04-09: 1000 mL via INTRAVENOUS

## 2022-04-09 MED ORDER — SODIUM CHLORIDE 0.9 % IV SOLN: INTRAVENOUS | Status: DC

## 2022-04-09 NOTE — ED Provider Notes (Signed)
[facility] Provider Note   CSN: [id number] Arrival date & time: 04/09/22  1006     History  Chief Complaint  Patient presents with   Dizziness   Weakness    Faith [name] is a 67 y.o. female.  Patient has a history of MS.  She says sometimes she gets dehydrated.  She has some dizziness but no real room spinning.  Just generalized weakness nothing out of the ordinary or worse per her baseline MS.  Patient also has a history of anemia.  Initial blood pressure when she got here was 87/74.  Patient uses a mobile chair and she was sitting drinking her coffee when she just started to feel dizzy.  She felt that she probably was dehydrated this has happened before.  Past medical history significant for anemia COPD patient is a current smoker.  Emphysema of the lungs neuromuscular disorder narcolepsy hyperlipidemia hypertension gastroesophageal reflux disease and multiple sclerosis.       Home Medications Prior to Admission medications   Medication Sig Start Date End Date Taking? Authorizing Provider  acetaminophen (TYLENOL) 650 MG CR tablet Take 1,300 mg by mouth in the morning and at bedtime. Take 2 tablets by mouth BID for chronic pain    [provider]  ascorbic acid (VITAMIN C) 500 MG tablet Take 1 tablet (500 mg total) by mouth daily. Patient not taking: Reported on 12/15/2021 02/18/21   Geradine Girt, DO  atorvastatin (LIPITOR) 20 MG tablet Take 20 mg by mouth daily.    [provider]  baclofen (LIORESAL) 10 MG tablet Take 1 tablet (10 mg total) by mouth 3 (three) times daily. Patient not taking: Reported on 12/15/2021 02/17/21   Geradine Girt, DO  celecoxib (CELEBREX) 200 MG capsule Take 1 capsule (200 mg total) by mouth 2 (two) times daily as needed. Patient not taking: Reported on 12/15/2021 04/15/21   Shelly Coss, MD  chlorhexidine (PERIDEX) 0.12 % solution Use as directed 15 mLs in the mouth or throat 4  (four) times daily. Patient not taking: Reported on 12/15/2021    [provider]  Cholecalciferol (VITAMIN D3) 50 MCG (2000 UT) capsule Take 2,000 Units by mouth daily. 02/11/21   [provider]  diazepam (VALIUM) 5 MG tablet Take 1 tablet (5 mg total) by mouth See admin instructions. TAKE (1) TABLET BY MOUTH EVERY SIX HOURS AS NEEDED FOR MUSCLE SPASMS. 02/17/21   Geradine Girt, DO  feeding supplement (ENSURE ENLIVE / ENSURE PLUS) LIQD Take 237 mLs by mouth 3 (three) times daily between meals. 12/12/20   Alma Friendly, MD  gabapentin (NEURONTIN) 300 MG capsule Take 2 capsules (600 mg total) by mouth 3 (three) times daily. 02/05/19   Angiulli, Lavon Paganini, PA-C  HYDROcodone-acetaminophen (NORCO/VICODIN) 5-325 MG tablet Take 1 tablet by mouth every 6 (six) hours as needed for moderate pain. 02/17/21   Geradine Girt, DO  metoprolol succinate (TOPROL-XL) 50 MG 24 hr tablet Take 50 mg by mouth at bedtime. Take 1 tablet by mouth at bedtime for tremor    [provider]  sodium hypochlorite (DAKIN'S 1/4 STRENGTH) 0.125 % SOLN Irrigate with as directed 2 (two) times daily at 10 AM and 5 PM. Patient not taking: Reported on 12/15/2021 09/22/21   Deatra James, MD  zinc sulfate 220 (50 Zn) MG capsule Take 1 capsule (220 mg total) by mouth daily. Patient not taking: Reported on 12/15/2021 02/18/21   Eulogio Bear  U, DO      Allergies    Darvon [propoxyphene] and Penicillins    Review of Systems   Review of Systems  Constitutional:  Positive for fatigue. Negative for chills and fever.  HENT:  Negative for ear pain and sore throat.   Eyes:  Negative for pain and visual disturbance.  Respiratory:  Negative for cough and shortness of breath.   Cardiovascular:  Negative for chest pain and palpitations.  Gastrointestinal:  Negative for abdominal pain and vomiting.  Genitourinary:  Negative for dysuria and hematuria.  Musculoskeletal:  Negative for arthralgias and back  pain.  Skin:  Negative for color change and rash.  Neurological:  Positive for dizziness and weakness. Negative for seizures and syncope.  All other systems reviewed and are negative.   Physical Exam Updated Vital Signs BP 100/70   Pulse 76   Temp 97.7 F (36.5 C) (Oral)   Resp 16   Ht 1.6 m (5' 3"$ )   Wt 43 kg   SpO2 95%   BMI 16.79 kg/m  Physical Exam Vitals and nursing note reviewed.  Constitutional:      General: She is not in acute distress.    Appearance: Normal appearance. She is well-developed.  HENT:     Head: Normocephalic and atraumatic.     Mouth/Throat:     Mouth: Mucous membranes are dry.  Eyes:     Extraocular Movements: Extraocular movements intact.     Conjunctiva/sclera: Conjunctivae normal.     Pupils: Pupils are equal, round, and reactive to light.  Cardiovascular:     Rate and Rhythm: Normal rate and regular rhythm.     Heart sounds: No murmur heard. Pulmonary:     Effort: Pulmonary effort is normal. No respiratory distress.     Breath sounds: Normal breath sounds.  Abdominal:     Palpations: Abdomen is soft.     Tenderness: There is no abdominal tenderness.  Musculoskeletal:        General: No swelling.     Cervical back: Normal range of motion and neck supple.  Skin:    General: Skin is warm and dry.     Capillary Refill: Capillary refill takes less than 2 seconds.  Neurological:     Mental Status: She is alert and oriented to person, place, and time. Mental status is at baseline.  Psychiatric:        Mood and Affect: Mood normal.     ED Results / Procedures / Treatments   Labs (all labs ordered are listed, but only abnormal results are displayed) Labs Reviewed  CBC WITH DIFFERENTIAL/PLATELET - Abnormal; Notable for the following components:      Result Value   RBC 3.78 (*)    Hemoglobin 11.9 (*)    Neutro Abs 8.5 (*)    Lymphs Abs 0.5 (*)    Eosinophils Absolute 0.6 (*)    All other components within normal limits  BASIC  METABOLIC PANEL  PATHOLOGIST SMEAR REVIEW  CBG MONITORING, ED    EKG None  Radiology No results found.  Procedures Procedures    Medications Ordered in ED Medications  0.9 %  sodium chloride infusion (has no administration in time range)  sodium chloride 0.9 % bolus 1,000 mL (1,000 mLs Intravenous New Bag/Given 04/09/22 1254)    ED Course/ Medical Decision Making/ A&P  Medical Decision Making Amount and/or Complexity of Data Reviewed Labs: ordered.  Risk Prescription drug management.   Patient given IV fluids here.  Repeat blood pressures have been [id number] systolic or little bit better.  Patient overall feels much better.  CBC white count 10.4 hemoglobin 11.9 platelets 291.  Basic metabolic panel without any significant abnormality renal function GFR is greater than 60.  Patient feels as if she can be discharged home.  Other vital signs no fever.  Heart rate 78 respirations 18 oxygen saturations on room air 99%.  No evidence of any other significant illness at this time.   Final Clinical Impression(s) / ED Diagnoses Final diagnoses:  Dehydration  MS (multiple sclerosis) ([place])    Rx / DC Orders ED Discharge Orders     None         Fredia Sorrow, MD 04/09/22 1626

## 2022-04-09 NOTE — ED Triage Notes (Signed)
Pt c/o recent dizziness and weakness. Pt has hx of MS, T2D and anemia.

## 2022-04-09 NOTE — Discharge Instructions (Addendum)
Return for any new or worse symptoms.  Try to encourage yourself to drink fluids.

## 2022-04-23 ENCOUNTER — Ambulatory Visit (HOSPITAL_COMMUNITY): Payer: Medicare Other

## 2022-05-24 ENCOUNTER — Inpatient Hospital Stay: Admission: RE | Admit: 2022-05-24 | Payer: Medicare Other | Source: Ambulatory Visit

## 2022-05-26 ENCOUNTER — Telehealth: Payer: Self-pay | Admitting: Neurology

## 2022-05-26 ENCOUNTER — Encounter: Payer: Self-pay | Admitting: Neurology

## 2022-05-26 ENCOUNTER — Ambulatory Visit (INDEPENDENT_AMBULATORY_CARE_PROVIDER_SITE_OTHER): Payer: Medicare Other | Admitting: Neurology

## 2022-05-26 VITALS — BP 94/63 | HR 83

## 2022-05-26 DIAGNOSIS — E559 Vitamin D deficiency, unspecified: Secondary | ICD-10-CM

## 2022-05-26 DIAGNOSIS — Z79899 Other long term (current) drug therapy: Secondary | ICD-10-CM | POA: Diagnosis not present

## 2022-05-26 DIAGNOSIS — G35 Multiple sclerosis: Secondary | ICD-10-CM | POA: Diagnosis not present

## 2022-05-26 DIAGNOSIS — G8220 Paraplegia, unspecified: Secondary | ICD-10-CM

## 2022-05-26 NOTE — Progress Notes (Signed)
[facility]  PATIENT: Faith [name] DOB: 12/05/1955  REFERRING DOCTOR OR PCP: Barney Drain, MD SOURCE: Patient, notes from PCP, notes from Dr. Merlene Laughter, note from recent hospital stay, imaging and lab reports, MRI images personally reviewed  _________________________________   HISTORICAL  CHIEF COMPLAINT:  Chief Complaint  Patient presents with   Room 11    Pt is here Alone. Pt states that she has muscle weakness in both of her legs. Pt states that she hasn't had any falls recently. Pt states that she doesn't have any burning or tingling sensations in her hand or feet, but she does have numbness.      HISTORY OF PRESENT ILLNESS:  Faith [name] is a 67 y.o. woman with multiple sclerosis.  Update 05/26/2022: She took [facility] March and April 2023 (4 pills each of the cycles).  She had lymphocytes 0.6 on 7/22.2023 and 0.8 (normal)12/02/2021 but 0.5 04/09/2022.  She does not feel any effect of the Mavenclad either way.  She did not have any trouble tolerating it.  She has active secondary progressive MS and has slowly worsened strength over the past couple years   if she has only been treated for a few years, on Tysabri for about 2 years and receiving 1 dose of Ocrevus.     She spends most of her time in the wheelchair. Arms are strong though legs are very weak and she can transfer from chair to recliner or bedside commode.     She notes numbness in both legs.     She is on baclofen for spasticity, propranolol for tremor and gabapentin for dysesthesias.  She also uses weighted utensils for her tremor.   Bladder function is fine.  She has a sacral decubitus that is being followed by wound management .  It is better than last year and almost healed    Vision is poor (bilateral).   She feels it is stable  Cognition is doing well.    She notes some fatigue in the afternoons but not in the morning.      She denies depression.   Sleep is variable due to sleep maintenance  insomnia.      MS History: She was diagnosed with MS in 2007 after presenting with gradual worsening of gait.  She had noted progressive worsening of gait before that year but the change seemed more rapid at the time.  Two to three years earlier she had no major disability.    She saw Dr. Merlene Laughter in [place] who diagnosed er with MS.   She received IV steroids.   She was placed on Tysabri and felt she was stable x 2 years.   However, due to JCV it was stopped.   She was switched to Alpha but felt very poorly and feverish so did not do the second half of the dose.   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   She did not resume any medication after that one dose.    She feels she worsened after the surgery,  She was using a walker before then and needed a wheelchair afterwards.  She had her first course of [place] March and April 2023.  She tolerated it well.  The second year of Mavenclad will be April and May 2024   Imaging personally reviewed: MRI of the brain 02/29/2020 shows scattered T2/FLAIR hyperintense foci in the hemispheres consistent with MS.  None of the foci enhanced.  There was mild atrophy.  Only 1 infratentorial focus in the right posterior pons.  MRI of the cervical spine 02/29/2020 showed a T2 hyperintense focus posteriorly towards the right adjacent to C4.  She also has fusion with posterior fixation at C3-C4 and foraminal narrowing to the left involving C7, to the left at C3 and bilaterally at C6.  The effusion resolved the spinal stenosis with possible myelopathy noted on her previous MRI  MRI of the thoracic spine 03/01/2020 showed multiple T2 hyperintense foci within the spinal cord most defined at T7, T10 and T11..  There was no enhancement.  No significant degenerative change.  MRI of the thoracic spine 04/12/2021 was unchanged compared to 03/01/2020.  MRI of the lumbar spine 04/12/2021 showed disc protrusion at L3-L4 that could affect the right L3 nerve  root.  REVIEW OF SYSTEMS: Constitutional: No fevers, chills, sweats, or change in appetite Eyes: No visual changes, double vision, eye pain Ear, nose and throat: No hearing loss, ear pain, nasal congestion, sore throat Cardiovascular: No chest pain, palpitations Respiratory:  No shortness of breath at rest or with exertion.   No wheezes GastrointestinaI: No nausea, vomiting, diarrhea, abdominal pain, fecal incontinence Genitourinary:  No dysuria, urinary retention or frequency.  No nocturia. Musculoskeletal:  No neck pain, back pain Integumentary: No rash, pruritus, skin lesions Neurological: as above Psychiatric: No depression at this time.  No anxiety Endocrine: No palpitations, diaphoresis, change in appetite, change in weigh or increased thirst Hematologic/Lymphatic: She has had anemia.. Allergic/Immunologic: No itchy/runny eyes, nasal congestion, recent allergic reactions, rashes  ALLERGIES: Allergies  Allergen Reactions   Darvon [Propoxyphene] Other (See Comments)    Hallicuations   Penicillins Rash    Did it involve swelling of the face/tongue/throat, SOB, or low BP? Unknown Did it involve sudden or severe rash/hives, skin peeling, or any reaction on the inside of your mouth or nose? Unknown Did you need to seek medical attention at a hospital or doctor's office? Unknown When did it last happen?      Unknown If all above answers are "NO", may proceed with cephalosporin use.     HOME MEDICATIONS:  Current Outpatient Medications:    acetaminophen (TYLENOL) 650 MG CR tablet, Take 1,300 mg by mouth in the morning and at bedtime. Take 2 tablets by mouth BID for chronic pain, Disp: , Rfl:    atorvastatin (LIPITOR) 20 MG tablet, Take 20 mg by mouth daily., Disp: , Rfl:    Cholecalciferol (VITAMIN D3) 50 MCG (2000 UT) capsule, Take 2,000 Units by mouth daily., Disp: , Rfl:    diazepam (VALIUM) 5 MG tablet, Take 1 tablet (5 mg total) by mouth See admin instructions. TAKE (1)  TABLET BY MOUTH EVERY SIX HOURS AS NEEDED FOR MUSCLE SPASMS., Disp: 10 tablet, Rfl: 0   feeding supplement (ENSURE ENLIVE / ENSURE PLUS) LIQD, Take 237 mLs by mouth 3 (three) times daily between meals., Disp: 237 mL, Rfl: 0   gabapentin (NEURONTIN) 300 MG capsule, Take 2 capsules (600 mg total) by mouth 3 (three) times daily., Disp: 180 capsule, Rfl: 1   HYDROcodone-acetaminophen (NORCO/VICODIN) 5-325 MG tablet, Take 1 tablet by mouth every 6 (six) hours as needed for moderate pain., Disp: 10 tablet, Rfl: 0   ascorbic acid (VITAMIN C) 500 MG tablet, Take 1 tablet (500 mg total) by mouth daily. (Patient not taking: Reported on 12/15/2021), Disp: , Rfl:    baclofen (LIORESAL) 10 MG tablet, Take 1 tablet (10 mg total) by mouth 3 (three) times daily. (Patient not taking: Reported  on 12/15/2021), Disp: 10 each, Rfl: 0   celecoxib (CELEBREX) 200 MG capsule, Take 1 capsule (200 mg total) by mouth 2 (two) times daily as needed. (Patient not taking: Reported on 12/15/2021), Disp: 60 capsule, Rfl: 2   chlorhexidine (PERIDEX) 0.12 % solution, Use as directed 15 mLs in the mouth or throat 4 (four) times daily. (Patient not taking: Reported on 12/15/2021), Disp: , Rfl:    metoprolol succinate (TOPROL-XL) 50 MG 24 hr tablet, Take 50 mg by mouth at bedtime. Take 1 tablet by mouth at bedtime for tremor (Patient not taking: Reported on 05/26/2022), Disp: , Rfl:    sodium hypochlorite (DAKIN'S 1/4 STRENGTH) 0.125 % SOLN, Irrigate with as directed 2 (two) times daily at 10 AM and 5 PM. (Patient not taking: Reported on 12/15/2021), Disp: 473 mL, Rfl: 0   zinc sulfate 220 (50 Zn) MG capsule, Take 1 capsule (220 mg total) by mouth daily. (Patient not taking: Reported on 12/15/2021), Disp: , Rfl:   PAST MEDICAL HISTORY: Past Medical History:  Diagnosis Date   Anemia    my whole life, Used to take iron   Arthritis    spine, lumbar   COPD (chronic obstructive pulmonary disease) ([place])    Emphysema of lung ([place])    GERD  (gastroesophageal reflux disease) 04/12/2021   HTN (hypertension) 03/01/2020   Hyperlipidemia 11/28/2019   Multiple sclerosis ([place])    Narcolepsy    by history, has nightmares   Neuromuscular disorder ([place])    leg issues from spine    PAST SURGICAL HISTORY: Past Surgical History:  Procedure Laterality Date   ANTERIOR CERVICAL DECOMP/DISCECTOMY FUSION N/A 01/19/2019   Procedure: ANTERIOR CERVICAL DECOMPRESSION/DISCECTOMY Cervical three - four;  Surgeon: Ashok Pall, MD;  Location: [place];  Service: Neurosurgery;  Laterality: N/A;   EYE SURGERY Right    ORIF FACIAL FRACTURE     POSTERIOR CERVICAL LAMINECTOMY N/A 01/19/2019   Procedure: POSTERIOR CERVICAL LAMINECTOMY Removal of Synovial Cyst and posterior cervical fusion;  Surgeon: Ashok Pall, MD;  Location: [facility];  Service: Neurosurgery;  Laterality: N/A;   TEE WITHOUT CARDIOVERSION N/A 03/17/2020   Procedure: TRANSESOPHAGEAL ECHOCARDIOGRAM (TEE) WITH PROPOFOL;  Surgeon: Satira Sark, MD;  [facility];  Service: Cardiovascular;  Laterality: N/A;   TUBAL LIGATION     tubaligation      FAMILY HISTORY: Family History  Problem Relation Age of Onset   Heart attack Mother    Kidney failure Mother    Diabetes Mother    Heart disease Mother    Hyperlipidemia Mother    Hypertension Mother    Heart attack Father 109   Hypertension Father    Hypertension Sister    Diabetes Sister    Other Brother        house fire   Hypertension Brother    Seizures Brother    Heart disease Brother        mitral valve   Arthritis Daughter        DJD, AS fibromyalgia   Polymyositis Daughter    Cancer Maternal Grandfather        lung   Heart disease Maternal Grandfather    Other Paternal Grandmother        house fire   Alcohol abuse Son     SOCIAL HISTORY:  Social History   Socioeconomic History   Marital status: Single    Spouse name: Not on file   Number of children: 3   Years of education: 18  Highest education  level: Not on file  Occupational History   Occupation: disabled    Comment: house painting  Tobacco Use   Smoking status: Every Day    Packs/day: 0.25    Years: 45.00    Additional pack years: 0.00    Total pack years: 11.25    Types: Cigarettes   Smokeless tobacco: Never   Tobacco comments:    cutting down  Vaping Use   Vaping Use: Never used  Substance and Sexual Activity   Alcohol use: Not Currently    Alcohol/week: 1.0 standard drink of alcohol    Types: 1 Cans of beer per week   Drug use: No   Sexual activity: Not Currently    Birth control/protection: Post-menopausal  Other Topics Concern   Not on file  Social History Narrative   Patient lives with her son. [name], age 31, frequently visits   Social Determinants of Health   Financial Resource Strain: Not on file  Food Insecurity: Not on file  Transportation Needs: Not on file  Physical Activity: Not on file  Stress: Not on file  Social Connections: Not on file  Intimate Partner Violence: Not on file     PHYSICAL EXAM  Vitals:   05/26/22 1414  BP: 94/63  Pulse: 83    There is no height or weight on file to calculate BMI.   General: The patient is well-developed and well-nourished and in no acute distress  HEENT:  Head is [place]/AT.  Sclera are anicteric.        Neurologic Exam  Mental status: The patient is alert and oriented x 3 at the time of the examination. The patient has apparent normal recent and remote memory, with an apparently normal attention span and concentration ability.   Speech is normal.  Cranial nerves: Extraocular movements are full. Pupils are equal, round, and reactive to light and accomodation.  Visual fields are full.  Facial symmetry is present. There is good facial sensation to soft touch bilaterally.Facial strength is normal.  Trapezius and sternocleidomastoid strength is normal. No dysarthria is noted.  Hearing was symmetric.  Motor:  Muscle bulk is normal.   Tone is  normal. Strength is  4+ to 5 / 5 in arms (better on right) and 4-/5 both iliopsoas and feet and 4/5 in quads.  Sensory: Sensory testing shows reduced vibration in right hand , mild reduced vibration in both legs symmetrically  Touch is stronger on her left than the right.  Legs more symmetric.    Coordination: Cerebellar testing reveals good left and mildly reduced right finger-nose-finger and poor heel-to-shin in the setting of leg weakness.  Gait and station: She cannot stand   Reflexes: Deep tendon reflexes are symmetric and normal in arms but increased in legs (crossed adductors at knees and clonus at ankles, multiple beats).      DIAGNOSTIC DATA (LABS, IMAGING, TESTING) - I reviewed patient records, labs, notes, testing and imaging myself where available.  Lab Results  Component Value Date   WBC 10.4 04/09/2022   HGB 11.9 (L) 04/09/2022   HCT 37.1 04/09/2022   MCV 98.1 04/09/2022   PLT 291 04/09/2022      Component Value Date/Time   NA 140 04/09/2022 1241   K 4.1 04/09/2022 1241   CL 102 04/09/2022 1241   CO2 29 04/09/2022 1241   GLUCOSE 87 04/09/2022 1241   BUN 14 04/09/2022 1241   CREATININE 0.66 04/09/2022 1241   CREATININE 0.53 11/28/2019 1339  CALCIUM 8.9 04/09/2022 1241   PROT 7.1 12/02/2021 1133   ALBUMIN 3.6 12/02/2021 1133   AST 25 12/02/2021 1133   ALT 22 12/02/2021 1133   ALKPHOS 72 12/02/2021 1133   BILITOT 0.4 12/02/2021 1133   GFRNONAA >60 04/09/2022 1241   GFRNONAA 101 11/28/2019 1339   GFRAA 117 11/28/2019 1339   Lab Results  Component Value Date   CHOL 199 11/28/2019   HDL 87 11/28/2019   LDLCALC 97 11/28/2019   TRIG 65 11/28/2019   CHOLHDL 2.3 11/28/2019   No results found for: "HGBA1C" Lab Results  Component Value Date   VITAMINB12 448 03/01/2020   Lab Results  Component Value Date   TSH 1.366 09/20/2021       ASSESSMENT AND PLAN  Multiple sclerosis ([place]) - Plan: MR BRAIN W WO CONTRAST, MR CERVICAL SPINE W WO CONTRAST,  Hepatitis B surface antigen, QuantiFERON-TB Gold Plus, Hepatitis B core antibody, total, CBC with Differential/Platelet, Hepatic function panel  High risk medication use - Plan: Hepatitis B surface antigen, QuantiFERON-TB Gold Plus, Hepatitis B core antibody, total, CBC with Differential/Platelet, Hepatic function panel  Diplegia of both lower extremities (HCC) - Plan: MR BRAIN W WO CONTRAST, MR CERVICAL SPINE W WO CONTRAST  Vitamin D deficiency - Plan: VITAMIN D 25 Hydroxy (Vit-D Deficiency, Fractures)  She completed the first year of [place] without difficulty.  We will initiate the second year if lymphocyte count is back into the normal range.  Of note, it was normal October 2023 but low again in February 2024.  She had a decubitus ulcer last year but it has improved.  Unfortunately, she has noted continued slow progression.  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  I would consider one of the BTK inhibitors when they become approved if they show more benefit for secondary progressive MS. She will return to see me in 3 months  She should call sooner if new or worsening neurologic symptoms.     Sherri Mcarthy A. Felecia Shelling, MD, [facility] [id number], 123456 PM Certified in Neurology, Clinical Neurophysiology, Sleep Medicine and Neuroimaging  [facility] [address] [place], [place] [address] [phone]

## 2022-05-26 NOTE — Telephone Encounter (Signed)
medicare NPR sent to [facility] [phone] 

## 2022-05-27 ENCOUNTER — Telehealth: Payer: Self-pay | Admitting: *Deleted

## 2022-05-27 NOTE — Telephone Encounter (Signed)
[facility]clad service request form faxed to [phone] received.

## 2022-06-01 NOTE — Telephone Encounter (Signed)
Covermymeds key to complete PA for Mavenclad: [id number]

## 2022-06-01 NOTE — Telephone Encounter (Signed)
Attempted to complete PA for [facility] via CMM using Key provided. Received this error message: "No eligibility was found. Please reconfirm the member's health plan coverage before re-submitting."

## 2022-06-03 NOTE — Telephone Encounter (Addendum)
I called patient.  She reports that she changed insurances.  She thinks it is Community education officer.  She does not have a copy of her insurance card.  She asked me to call [facility] to obtain this information.  I called [facility].  I was forced to leave a voicemail with Latoya, clinical staff.  I inquired if we need to do a prior authorization for the [facility] or if they will do so.  It will be helpful if we had the updated insurance information on file, especially if we are required to do the prior authorization.  I asked Latoya to call us back.  If Latoya calls back please obtain patient's insurance information.  It is likely Community education officer.  Also ask her if our clinic is required to do the prior authorizations for her medications or if [facility] is doing that.

## 2022-06-05 LAB — CBC WITH DIFFERENTIAL/PLATELET
Basophils Absolute: 0 10*3/uL (ref 0.0–0.2)
Basos: 0 %
EOS (ABSOLUTE): 0 10*3/uL (ref 0.0–0.4)
Eos: 0 %
Hematocrit: 38.7 % (ref 34.0–46.6)
Hemoglobin: 12.8 g/dL (ref 11.1–15.9)
Immature Grans (Abs): 0 10*3/uL (ref 0.0–0.1)
Immature Granulocytes: 0 %
Lymphocytes Absolute: 1.9 10*3/uL (ref 0.7–3.1)
Lymphs: 24 %
MCH: 30.3 pg (ref 26.6–33.0)
MCHC: 33.1 g/dL (ref 31.5–35.7)
MCV: 92 fL (ref 79–97)
Monocytes Absolute: 0.8 10*3/uL (ref 0.1–0.9)
Monocytes: 10 %
Neutrophils Absolute: 5.4 10*3/uL (ref 1.4–7.0)
Neutrophils: 66 %
Platelets: 299 10*3/uL (ref 150–450)
RBC: 4.22 x10E6/uL (ref 3.77–5.28)
RDW: 13.9 % (ref 11.7–15.4)
WBC: 8.1 10*3/uL (ref 3.4–10.8)

## 2022-06-05 LAB — QUANTIFERON-TB GOLD PLUS
QuantiFERON Mitogen Value: 0.55 IU/mL
QuantiFERON Nil Value: 0.04 IU/mL
QuantiFERON TB1 Ag Value: 1 IU/mL
QuantiFERON TB2 Ag Value: 1.54 IU/mL
QuantiFERON-TB Gold Plus: POSITIVE — AB

## 2022-06-05 LAB — HEPATITIS B SURFACE ANTIGEN: Hepatitis B Surface Ag: NEGATIVE

## 2022-06-05 LAB — HEPATIC FUNCTION PANEL
ALT: 8 IU/L (ref 0–32)
AST: 13 IU/L (ref 0–40)
Albumin: 4 g/dL (ref 3.9–4.9)
Alkaline Phosphatase: 89 IU/L (ref 44–121)
Bilirubin Total: <0.2 mg/dL (ref 0.0–1.2)
Bilirubin, Direct: <0.1 mg/dL (ref 0.00–0.40)
Total Protein: 6.6 g/dL (ref 6.0–8.5)

## 2022-06-05 LAB — HEPATITIS B CORE ANTIBODY, TOTAL: Hep B Core Total Ab: NEGATIVE

## 2022-06-05 LAB — VITAMIN D 25 HYDROXY (VIT D DEFICIENCY, FRACTURES): Vit D, 25-Hydroxy: 86.3 ng/mL (ref 30.0–100.0)

## 2022-06-07 ENCOUNTER — Other Ambulatory Visit: Payer: Self-pay | Admitting: Neurology

## 2022-06-07 ENCOUNTER — Other Ambulatory Visit: Payer: Self-pay | Admitting: *Deleted

## 2022-06-07 DIAGNOSIS — R7612 Nonspecific reaction to cell mediated immunity measurement of gamma interferon antigen response without active tuberculosis: Secondary | ICD-10-CM

## 2022-06-07 DIAGNOSIS — G35D Multiple sclerosis, unspecified: Secondary | ICD-10-CM

## 2022-06-07 DIAGNOSIS — G35 Multiple sclerosis: Secondary | ICD-10-CM

## 2022-06-07 DIAGNOSIS — Z79899 Other long term (current) drug therapy: Secondary | ICD-10-CM

## 2022-06-08 ENCOUNTER — Telehealth: Payer: Self-pay | Admitting: *Deleted

## 2022-06-08 NOTE — Telephone Encounter (Signed)
Left message for patient

## 2022-06-08 NOTE — Telephone Encounter (Signed)
-----   Message from Asa Lente, MD sent at 06/07/2022  4:17 PM EDT ----- Please let her know that the TB test was positive.  This is usually a false positive but I need to have her repeat the test.  I will place the order.  She should go to a [facility] facility to make sure it is processed as rapidly as possible..  I will place the order.  We need this before we can have her do the [facility]

## 2022-06-09 NOTE — Telephone Encounter (Signed)
I called [facility]. I spoke with Latoya. She is unsure of who does the PA for [facility]. I advised her that [facility] approved her PA for [facility] last year but when we attempt the PA this year, it says that she is ineligible. Glee Arvin will look further into this and call us back. Latoya reports that they think the only insurance that the patient has is through [facility].

## 2022-06-09 NOTE — Telephone Encounter (Signed)
Patient informed. Pt verbalized she will go a [facility] in her area.  Order was released

## 2022-06-16 NOTE — Progress Notes (Signed)
Patient did not complete LDCT as scheduled, reminder letter mailed to patient's listed mailing address asking that the patient call me to schedule follow-up.

## 2022-06-18 NOTE — Telephone Encounter (Signed)
Received this notice from Addy at [facility]: "I have been in communication with the ins. rep. Leonor Liv, this is her phone number [phone].  She had advised that Pt has zero dollar copay and PA approved. [facility] and [facility] advised PA needed so I'll reach out to [place] again around noon today and update you?"

## 2022-06-21 NOTE — Telephone Encounter (Addendum)
Called pt at [phone] will bring her this Wednesday to complete repeat TB test. Aware we will call her with results once back.    She states [facility] saw her today and gave her what she though was TB shot (unsure what they gave her).  Called [facility] at  [phone]. Spoke w/ Shanda Bumps. Transferred me to clinic. LVM for Leodis Binet to call back to give clarification on what was given. She states she will call in 1 business day. If not, to call her supervisor: Felicie Morn [phone]

## 2022-06-22 ENCOUNTER — Ambulatory Visit (HOSPITAL_COMMUNITY)
Admission: RE | Admit: 2022-06-22 | Discharge: 2022-06-22 | Disposition: A | Discharge status: Home / Self Care | Payer: Medicare Other | Source: Ambulatory Visit | Attending: Neurology | Admitting: Neurology

## 2022-06-22 DIAGNOSIS — G35 Multiple sclerosis: Secondary | ICD-10-CM

## 2022-06-22 DIAGNOSIS — G8220 Paraplegia, unspecified: Secondary | ICD-10-CM | POA: Diagnosis present

## 2022-06-22 MED ORDER — GADOBUTROL 1 MMOL/ML IV SOLN
4.5000 mL | Freq: Once | INTRAVENOUS | Status: AC | PRN
  Administered 2022-06-22: 4.5 mL via INTRAVENOUS

## 2022-06-23 NOTE — Telephone Encounter (Signed)
Took call and spoke w/ Annice Pih. They did skin TB test. They will do TB lab test today while pt there. Faxed order to them at [phone]. Received fax confirmation.   She advised they help manage her care. Any future labs needing drawn can be faxed to them. Their phone number is [phone].

## 2022-06-26 LAB — QUANTIFERON-TB GOLD PLUS
QuantiFERON Mitogen Value: 1.01 IU/mL
QuantiFERON Nil Value: 0 IU/mL
QuantiFERON TB1 Ag Value: 0 IU/mL
QuantiFERON TB2 Ag Value: 0.26 IU/mL
QuantiFERON-TB Gold Plus: NEGATIVE

## 2022-07-07 ENCOUNTER — Ambulatory Visit: Payer: Medicare Other

## 2022-07-07 NOTE — Telephone Encounter (Signed)
Per Addie at [facility]: "I got an update [facility] that the year 2 month 1 shipped on 07/02/2022 for [employer] 1955/07/04."

## 2022-07-09 ENCOUNTER — Other Ambulatory Visit (HOSPITAL_COMMUNITY): Payer: Self-pay | Admitting: Internal Medicine

## 2022-07-09 DIAGNOSIS — Z0001 Encounter for general adult medical examination with abnormal findings: Secondary | ICD-10-CM

## 2022-07-12 ENCOUNTER — Other Ambulatory Visit (HOSPITAL_COMMUNITY): Payer: Self-pay | Admitting: Internal Medicine

## 2022-07-12 DIAGNOSIS — Z0001 Encounter for general adult medical examination with abnormal findings: Secondary | ICD-10-CM

## 2022-07-12 NOTE — Telephone Encounter (Signed)
I called patient to discuss if she received her second year of Mavenclad and has started taking it.  If so, I was wondering if she is tolerating it well.  No answer, left a voicemail asking her to call us back.  If patient calls back please ask her if she has started her second year of Mavenclad and if so, tolerating it well.

## 2022-07-14 ENCOUNTER — Other Ambulatory Visit: Payer: Self-pay | Admitting: Family Medicine

## 2022-07-14 DIAGNOSIS — J441 Chronic obstructive pulmonary disease with (acute) exacerbation: Secondary | ICD-10-CM

## 2022-07-14 DIAGNOSIS — F17210 Nicotine dependence, cigarettes, uncomplicated: Secondary | ICD-10-CM

## 2022-07-15 ENCOUNTER — Ambulatory Visit: Payer: Medicare Other

## 2022-07-16 ENCOUNTER — Other Ambulatory Visit (HOSPITAL_COMMUNITY): Payer: Self-pay | Admitting: Family Medicine

## 2022-07-16 ENCOUNTER — Other Ambulatory Visit (HOSPITAL_COMMUNITY): Payer: Self-pay | Admitting: Internal Medicine

## 2022-07-16 DIAGNOSIS — Z1231 Encounter for screening mammogram for malignant neoplasm of breast: Secondary | ICD-10-CM

## 2022-07-16 DIAGNOSIS — Z0001 Encounter for general adult medical examination with abnormal findings: Secondary | ICD-10-CM

## 2022-07-16 DIAGNOSIS — J441 Chronic obstructive pulmonary disease with (acute) exacerbation: Secondary | ICD-10-CM

## 2022-07-16 DIAGNOSIS — F17210 Nicotine dependence, cigarettes, uncomplicated: Secondary | ICD-10-CM

## 2022-07-19 ENCOUNTER — Ambulatory Visit (HOSPITAL_COMMUNITY): Payer: Medicare Other

## 2022-07-20 NOTE — Telephone Encounter (Signed)
I called patient. She reports that she completed Y2M1 of Mavenclad a few weeks ago and is due for Y2M2 in June. She tolerated it well. She will keep her upcoming appt in August and is aware that labs will be done at that appointment. She will let us know of interim questions or concerns.

## 2022-07-23 ENCOUNTER — Other Ambulatory Visit (HOSPITAL_COMMUNITY): Payer: Self-pay

## 2022-08-04 ENCOUNTER — Ambulatory Visit (INDEPENDENT_AMBULATORY_CARE_PROVIDER_SITE_OTHER): Payer: Medicare Other | Admitting: Podiatry

## 2022-08-04 ENCOUNTER — Ambulatory Visit: Payer: Medicare Other | Admitting: Podiatry

## 2022-08-04 DIAGNOSIS — Z91199 Patient's noncompliance with other medical treatment and regimen due to unspecified reason: Secondary | ICD-10-CM

## 2022-08-04 NOTE — Progress Notes (Signed)
No show

## 2022-08-09 NOTE — Telephone Encounter (Signed)
Received this note from Addy at [facility]: "I just got off the phone with [facility] and I spoke with Pt's nurse Albesa Seen that reached out to Pt and advised her to answer my call. Pt. and [facility] then [facility] and was able to set up delivery of M2  for 08/16/2022."

## 2022-08-11 ENCOUNTER — Inpatient Hospital Stay (HOSPITAL_COMMUNITY): Admission: RE | Admit: 2022-08-11 | Payer: Medicare Other | Source: Ambulatory Visit

## 2022-08-18 ENCOUNTER — Other Ambulatory Visit: Payer: Medicare Other

## 2022-08-27 ENCOUNTER — Inpatient Hospital Stay: Admission: RE | Admit: 2022-08-27 | Payer: Medicare Other | Source: Ambulatory Visit

## 2022-08-30 ENCOUNTER — Ambulatory Visit (INDEPENDENT_AMBULATORY_CARE_PROVIDER_SITE_OTHER): Payer: Medicare Other | Admitting: Podiatry

## 2022-08-30 DIAGNOSIS — Z91199 Patient's noncompliance with other medical treatment and regimen due to unspecified reason: Secondary | ICD-10-CM

## 2022-08-30 NOTE — Progress Notes (Signed)
No show

## 2022-09-07 ENCOUNTER — Ambulatory Visit: Payer: Medicare Other | Admitting: Infectious Disease

## 2022-09-07 NOTE — Progress Notes (Deleted)
Subjective:  Chief complaint     Patient ID: Faith [name], female    DOB: March 10, 1955, 67 y.o.   MRN: [id number]  HPI  with history of multiple sclerosis bedbound JC virus who was admitted to the hospital fever tachycardia hypotension.  Blood cultures were taken which failed to reveal any organism.  CT scan of the pelvis showed some mild amount of cortical structure and which was equivocal for acute osteomyelitis.   I consulted with her remotely using telehealth while she was at [facility] and we decided to treat with oral antibiotics in the form of doxycycline and Augmentin.  She followed up with wound care in August and was doing well.  She continues to [facility].  She does not have chills nausea or other systemic symptoms to suggest active infection she has not had worsening drainage from the wound.  She has been told that the wound is doing well and that will potentially heal up in the next couple of months.  She was seen in the ER recently with some dizziness but this was attributed to a change in her beta-blocker.    Past Medical History:  Diagnosis Date   Anemia    my whole life, Used to take iron   Arthritis    spine, lumbar   COPD (chronic obstructive pulmonary disease) (HCC)    Emphysema of lung (HCC)    GERD (gastroesophageal reflux disease) 04/12/2021   HTN (hypertension) 03/01/2020   Hyperlipidemia 11/28/2019   Multiple sclerosis (HCC)    Narcolepsy    by history, has nightmares   Neuromuscular disorder (HCC)    leg issues from spine    Past Surgical History:  Procedure Laterality Date   ANTERIOR CERVICAL DECOMP/DISCECTOMY FUSION N/A 01/19/2019   Procedure: ANTERIOR CERVICAL DECOMPRESSION/DISCECTOMY Cervical three - four;  Surgeon: Coletta Memos, MD;  Location: [facility] OR;  Service: Neurosurgery;  Laterality: N/A;   EYE SURGERY Right    ORIF FACIAL FRACTURE     POSTERIOR CERVICAL LAMINECTOMY N/A 01/19/2019   Procedure: POSTERIOR CERVICAL  LAMINECTOMY Removal of Synovial Cyst and posterior cervical fusion;  Surgeon: Coletta Memos, MD;  Location: [facility] OR;  Service: Neurosurgery;  Laterality: N/A;   TEE WITHOUT CARDIOVERSION N/A 03/17/2020   Procedure: TRANSESOPHAGEAL ECHOCARDIOGRAM (TEE) WITH PROPOFOL;  Surgeon: Jonelle Sidle, MD;  Location: [facility];  Service: Cardiovascular;  Laterality: N/A;   TUBAL LIGATION     tubaligation      Family History  Problem Relation Age of Onset   Heart attack Mother    Kidney failure Mother    Diabetes Mother    Heart disease Mother    Hyperlipidemia Mother    Hypertension Mother    Heart attack Father 14   Hypertension Father    Hypertension Sister    Diabetes Sister    Other Brother        house fire   Hypertension Brother    Seizures Brother    Heart disease Brother        mitral valve   Arthritis Daughter        DJD, AS fibromyalgia   Polymyositis Daughter    Cancer Maternal Grandfather        lung   Heart disease Maternal Grandfather    Other Paternal Grandmother        house fire   Alcohol abuse Son       Social History   Socioeconomic History   Marital  status: Single    Spouse name: Not on file   Number of children: 3   Years of education: 12   Highest education level: Not on file  Occupational History   Occupation: disabled    Comment: house painting  Tobacco Use   Smoking status: Every Day    Packs/day: 0.25    Years: 45.00    Additional pack years: 0.00    Total pack years: 11.25    Types: Cigarettes   Smokeless tobacco: Never   Tobacco comments:    cutting down  Vaping Use   Vaping Use: Never used  Substance and Sexual Activity   Alcohol use: Not Currently    Alcohol/week: 1.0 standard drink of alcohol    Types: 1 Cans of beer per week   Drug use: No   Sexual activity: Not Currently    Birth control/protection: Post-menopausal  Other Topics Concern   Not on file  Social History Narrative   Patient lives with her son. Mauro Kaufmann, age 71, frequently visits   Social Determinants of Health   Financial Resource Strain: Not on file  Food Insecurity: Not on file  Transportation Needs: Not on file  Physical Activity: Not on file  Stress: Not on file  Social Connections: Not on file    Allergies  Allergen Reactions   Darvon [Propoxyphene] Other (See Comments)    Hallicuations   Penicillins Rash    Did it involve swelling of the face/tongue/throat, SOB, or low BP? Unknown Did it involve sudden or severe rash/hives, skin peeling, or any reaction on the inside of your mouth or nose? Unknown Did you need to seek medical attention at a hospital or doctor's office? Unknown When did it last happen?      Unknown If all above answers are "NO", may proceed with cephalosporin use.      Current Outpatient Medications:    acetaminophen (TYLENOL) 650 MG CR tablet, Take 1,300 mg by mouth in the morning and at bedtime. Take 2 tablets by mouth BID for chronic pain, Disp: , Rfl:    ascorbic acid (VITAMIN C) 500 MG tablet, Take 1 tablet (500 mg total) by mouth daily. (Patient not taking: Reported on 12/15/2021), Disp: , Rfl:    atorvastatin (LIPITOR) 20 MG tablet, Take 20 mg by mouth daily., Disp: , Rfl:    baclofen (LIORESAL) 10 MG tablet, Take 1 tablet (10 mg total) by mouth 3 (three) times daily. (Patient not taking: Reported on 12/15/2021), Disp: 10 each, Rfl: 0   celecoxib (CELEBREX) 200 MG capsule, Take 1 capsule (200 mg total) by mouth 2 (two) times daily as needed. (Patient not taking: Reported on 12/15/2021), Disp: 60 capsule, Rfl: 2   chlorhexidine (PERIDEX) 0.12 % solution, Use as directed 15 mLs in the mouth or throat 4 (four) times daily. (Patient not taking: Reported on 12/15/2021), Disp: , Rfl:    Cholecalciferol (VITAMIN D3) 50 MCG (2000 UT) capsule, Take 2,000 Units by mouth daily., Disp: , Rfl:    Cladribine, 4 Tabs, (MAVENCLAD, 4 TABS,) 10 MG TBPK, Take 10 mg by mouth daily. Take 10mg  by mouth on days 1-4.  Repeat following month., Disp: , Rfl:    diazepam (VALIUM) 5 MG tablet, Take 1 tablet (5 mg total) by mouth See admin instructions. TAKE (1) TABLET BY MOUTH EVERY SIX HOURS AS NEEDED FOR MUSCLE SPASMS., Disp: 10 tablet, Rfl: 0   feeding supplement (ENSURE ENLIVE / ENSURE PLUS) LIQD, Take 237 mLs by mouth 3 (three) times  daily between meals., Disp: 237 mL, Rfl: 0   gabapentin (NEURONTIN) 300 MG capsule, Take 2 capsules (600 mg total) by mouth 3 (three) times daily., Disp: 180 capsule, Rfl: 1   HYDROcodone-acetaminophen (NORCO/VICODIN) 5-325 MG tablet, Take 1 tablet by mouth every 6 (six) hours as needed for moderate pain., Disp: 10 tablet, Rfl: 0   metoprolol succinate (TOPROL-XL) 50 MG 24 hr tablet, Take 50 mg by mouth at bedtime. Take 1 tablet by mouth at bedtime for tremor (Patient not taking: Reported on 05/26/2022), Disp: , Rfl:    sodium hypochlorite (DAKIN'S 1/4 STRENGTH) 0.125 % SOLN, Irrigate with as directed 2 (two) times daily at 10 AM and 5 PM. (Patient not taking: Reported on 12/15/2021), Disp: 473 mL, Rfl: 0   zinc sulfate 220 (50 Zn) MG capsule, Take 1 capsule (220 mg total) by mouth daily. (Patient not taking: Reported on 12/15/2021), Disp: , Rfl:     Review of Systems  Constitutional:  Negative for activity change, appetite change, chills, diaphoresis, fatigue, fever and unexpected weight change.  HENT:  Negative for congestion, rhinorrhea, sinus pressure, sneezing, sore throat and trouble swallowing.   Eyes:  Negative for photophobia and visual disturbance.  Respiratory:  Negative for cough, chest tightness, shortness of breath, wheezing and stridor.   Cardiovascular:  Negative for chest pain, palpitations and leg swelling.  Gastrointestinal:  Negative for abdominal distention, abdominal pain, anal bleeding, blood in stool, constipation, diarrhea, nausea and vomiting.  Genitourinary:  Negative for difficulty urinating, dysuria, flank pain and hematuria.  Musculoskeletal:  Negative  for arthralgias, back pain, gait problem, joint swelling and myalgias.  Skin:  Positive for wound. Negative for color change, pallor and rash.  Neurological:  Positive for weakness. Negative for dizziness, tremors and light-headedness.  Hematological:  Negative for adenopathy. Does not bruise/bleed easily.  Psychiatric/Behavioral:  Negative for agitation, behavioral problems, confusion, decreased concentration, dysphoric mood and sleep disturbance.        Objective:   Physical Exam Constitutional:      General: She is not in acute distress.    Appearance: Normal appearance. She is well-developed and underweight. She is not ill-appearing or diaphoretic.  HENT:     Head: Normocephalic and atraumatic.     Right Ear: Hearing and external ear normal.     Left Ear: Hearing and external ear normal.     Nose: No nasal deformity or rhinorrhea.  Eyes:     General: No scleral icterus.    Conjunctiva/sclera: Conjunctivae normal.     Right eye: Right conjunctiva is not injected.     Left eye: Left conjunctiva is not injected.     Pupils: Pupils are equal, round, and reactive to light.  Neck:     Vascular: No JVD.  Cardiovascular:     Rate and Rhythm: Normal rate and regular rhythm.     Heart sounds: S1 normal and S2 normal.  Pulmonary:     Effort: Pulmonary effort is normal. No respiratory distress.     Breath sounds: No wheezing.  Abdominal:     Palpations: Abdomen is soft.     Tenderness: There is no abdominal tenderness.  Musculoskeletal:        General: Normal range of motion.     Right shoulder: Normal.     Left shoulder: Normal.     Cervical back: Normal range of motion and neck supple.     Right hip: Normal.     Left hip: Normal.     Right knee:  Normal.     Left knee: Normal.  Lymphadenopathy:     Head:     Right side of head: No submandibular, preauricular or posterior auricular adenopathy.     Left side of head: No submandibular, preauricular or posterior auricular  adenopathy.     Cervical: No cervical adenopathy.     Right cervical: No superficial or deep cervical adenopathy.    Left cervical: No superficial or deep cervical adenopathy.  Skin:    General: Skin is warm and dry.     Coloration: Skin is not pale.     Findings: No abrasion, bruising, ecchymosis, erythema, lesion or rash.     Nails: There is no clubbing.  Neurological:     Mental Status: She is alert and oriented to person, place, and time.     Sensory: No sensory deficit.     Coordination: Coordination normal.  Psychiatric:        Attention and Perception: She is attentive.        Mood and Affect: Mood normal.        Speech: Speech normal.        Behavior: Behavior normal. Behavior is cooperative.        Thought Content: Thought content normal.        Judgment: Judgment normal.    Sacral wound 12/21/2021:          Assessment & Plan:   Stage IV decubitus ulcer with osteomyelitis status post 6 weeks of oral antibiotics.  Wound is without evidence of clear-cut infection at this point in time.  She should continue with wound care and offloading.  Certainly would be happy to see her to develop any acute worsening of her infection.  Lightheadedness dizziness this was due to a change in beta-blockers leukocytosis: Seen on labs done in the ER is of uncertain significance.  Is not seem to be clinically infected at this point in time.  Multiple sclerosis: We will continue to follow with neurology.

## 2022-09-16 ENCOUNTER — Encounter (HOSPITAL_BASED_OUTPATIENT_CLINIC_OR_DEPARTMENT_OTHER): Payer: Medicare (Managed Care) | Attending: General Surgery | Admitting: General Surgery

## 2022-09-16 DIAGNOSIS — F17210 Nicotine dependence, cigarettes, uncomplicated: Secondary | ICD-10-CM | POA: Insufficient documentation

## 2022-09-16 DIAGNOSIS — I739 Peripheral vascular disease, unspecified: Secondary | ICD-10-CM | POA: Insufficient documentation

## 2022-09-16 DIAGNOSIS — M8660 Other chronic osteomyelitis, unspecified site: Secondary | ICD-10-CM | POA: Diagnosis not present

## 2022-09-16 DIAGNOSIS — L89154 Pressure ulcer of sacral region, stage 4: Secondary | ICD-10-CM | POA: Insufficient documentation

## 2022-09-16 DIAGNOSIS — G35 Multiple sclerosis: Secondary | ICD-10-CM | POA: Diagnosis not present

## 2022-09-16 DIAGNOSIS — J449 Chronic obstructive pulmonary disease, unspecified: Secondary | ICD-10-CM | POA: Insufficient documentation

## 2022-09-16 NOTE — Progress Notes (Signed)
TASHANNA, DOLIN A ([id number]) [id number] Page 1 of 8 Visit Report for 09/16/2022 Chief Complaint Document Details Patient Name: Date of Service: Faith [name], Faith [name] 09/16/2022 12:45 PM Medical Record Number: [id number] Patient Account Number: [id number] Date of Birth/Sex: Treating RN: Jun 29, 1955 (66 y.o. F) Primary Care Provider: INC, PA CE Other Clinician: Referring Provider: Treating Provider/Extender: Darnelle Spangle, PA CE Weeks in Treatment: 0 Information Obtained from: Patient Chief Complaint 09/07/2021; stage IV sacral decubitus ulcer 09/15/2021: returns with same, unchanged from prior Electronic Signature(s) Signed: 09/16/2022 3:18:49 PM By: Duanne Guess MD FACS Entered By: Duanne Guess on 09/16/2022 15:18:49 -------------------------------------------------------------------------------- HPI Details Patient Name: Date of Service: Faith [name], [name] 09/16/2022 12:45 PM Medical Record Number: [id number] Patient Account Number: [id number] Date of Birth/Sex: Treating RN: 07/03/1955 (67 y.o. F) Primary Care Provider: INC, PA CE Other Clinician: Referring Provider: Treating Provider/Extender: Darnelle Spangle, PA CE Weeks in Treatment: 0 History of Present Illness HPI Description: Admission 09/07/2021 Ms. 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 She has been using Hydrofera Blue to the wound bed. She is a patient of [facility]. She follows with Dr. Dorothe Pea. She currently denies any issues and states the wound has been stable. She currently denies signs of infection. She had an MRI of the lumbar spine on 04/12/2021 at that time they noted no evidence of osteomyelitis to the sacrum or lumbar spine. She reports sleeping in a recliner. She does have a hospital bed but does not have an air mattress.  She is able to ambulate with a walker however mostly remains wheelchair-bound. 10/20/2021; patient presents for follow-up. She reports that the wound VAC was started however quickly discontinued due to green drainage. She is currently using Dakin's wet-to-dry dressings. She was admitted to the hospital on 09/19/2021 for sepsis secondary to her stage IV sacral ulcer. She is currently on p.o. cefdinir and doxycycline for total of 6 weeks And following with infectious disease. During the admission it was recommended that she follow-up with palliative care as an outpatient. READMISSION 09/16/2022: The patient returns with the same wound for which she was seen previously. There really has been no change to the site. She continues to spend roughly 90% of her time in a recliner such that the area is not being adequately offloaded. She continues to smoke. She does have a low-air-loss mattress but if she is on it, she is mostly on her back. She reports drinking 3-4 ensures per day. Her wound is being packed with Dakin's moistened gauze. Electronic Signature(s) Signed: 09/16/2022 3:21:48 PM By: Duanne Guess MD FACS Entered By: Duanne Guess on 09/16/2022 15:21:48 CLELA, HAGADORN A ([id number]) [id number] Page 2 of 8 -------------------------------------------------------------------------------- Physical Exam Details Patient Name: Date of Service: Faith, [name] 09/16/2022 12:45 PM Medical Record Number: [id number] Patient Account Number: [id number] Date of Birth/Sex: Treating RN: 11/24/1955 (67 y.o. F) Primary Care Provider: INC, PA CE Other Clinician: Referring Provider: Treating Provider/Extender: Duanne Guess INC, PA CE Weeks in Treatment: 0 Constitutional . . . . No acute distress. Respiratory Normal work of breathing on room air. Notes There is a stage IV pressure ulcer on the patient's sacrum. I can feel bone underneath a very thin layer of tissue. There is  circumferential undermining. The tissues are pale but moist. There is surrounding purple discoloration consistent with pressure-induced tissue  injury. Electronic Signature(s) Signed: 09/16/2022 3:22:47 PM By: Duanne Guess MD FACS Entered By: Duanne Guess on 09/16/2022 15:22:47 -------------------------------------------------------------------------------- Physician Orders Details Patient Name: Date of Service: Faith [name], [name] 09/16/2022 12:45 PM Medical Record Number: [id number] Patient Account Number: [id number] Date of Birth/Sex: Treating RN: 12-18-1955 (67 y.o. Fredderick Phenix Primary Care Provider: INC, [place] CE Other Clinician: Referring Provider: Treating Provider/Extender: Darnelle Spangle, PA CE Weeks in Treatment: 0 Verbal / Phone Orders: No Diagnosis Coding ICD-10 Coding Code Description L89.154 Pressure ulcer of sacral region, stage 4 G35 Multiple sclerosis J44.9 Chronic obstructive pulmonary disease, unspecified I73.9 Peripheral vascular disease, unspecified M86.60 Other chronic osteomyelitis, unspecified site Follow-up Appointments ppointment in 1 week. - Dr. Lady Gary - room 2 - 8/1 at 12:45 NEEDS ROOM 5 Return A Anesthetic (In clinic) Topical Lidocaine 4% applied to wound bed Bathing/ Shower/ Hygiene May shower and wash wound with soap and water. Off-Loading Low air-loss mattress (Group 2) Turn and reposition every 2 hours Other: - need to keep pressure off of the wound, staying in the recliner most of the day will cause pressure on the wound Additional Orders / Instructions Stop/Decrease Smoking - smoking inhibits wound healing KAELENE, ELLISTON A ([id number]) [phone].pdf Page 3 of 8 Follow Nutritious Diet - keep drinking protein shakes Other: - recommended to start taking vitamin C 500 mg x3 a day and zinc 30-50 mg once a day Home Health New wound care orders this week; continue Home Health for wound care. May utilize formulary  equivalent dressing for wound treatment orders unless otherwise specified. - recommended to start taking vitamin C 500 mg x3 a day and zinc 30-50 mg once a day Other Home Health Orders/Instructions: - [facility] Wound Treatment Wound #3 - Sacrum Cleanser: Soap and Water 1 x Per Day/30 Days Discharge Instructions: May shower and wash wound with dial antibacterial soap and water prior to dressing change. Cleanser: Vashe 5.8 (oz) 1 x Per Day/30 Days Discharge Instructions: Cleanse the wound with Vashe prior to applying a clean dressing using gauze sponges, not tissue or cotton balls. Prim Dressing: Dakin's Solution 0.25%, 16 (oz) 1 x Per Day/30 Days ary Discharge Instructions: Moisten gauze with Dakin's solution Secondary Dressing: Woven Gauze Sponge, Non-Sterile 4x4 in 1 x Per Day/30 Days Discharge Instructions: Apply over primary dressing as directed. Secondary Dressing: Zetuvit Plus Silicone Border Sacrum Dressing, Sm, 7x7 (in/in) 1 x Per Day/30 Days Discharge Instructions: Apply silicone border over primary dressing as directed. Patient Medications llergies: Darvon, penicillin A Notifications Medication Indication Start End 09/16/2022 lidocaine DOSE topical 4 % cream - cream topical Electronic Signature(s) Signed: 09/16/2022 4:23:46 PM By: Duanne Guess MD FACS Entered By: Duanne Guess on 09/16/2022 15:38:40 -------------------------------------------------------------------------------- Problem List Details Patient Name: Date of Service: Faith [name] A. 09/16/2022 12:45 PM Medical Record Number: [id number] Patient Account Number: [id number] Date of Birth/Sex: Treating RN: January 12, 1956 (67 y.o. F) Primary Care Provider: INC, PA CE Other Clinician: Referring Provider: Treating Provider/Extender: Darnelle Spangle, PA CE Weeks in Treatment: 0 Active Problems ICD-10 Encounter Code Description Active Date MDM Diagnosis L89.154 Pressure ulcer of sacral region, stage 4  09/16/2022 No Yes G35 Multiple sclerosis 09/16/2022 No Yes J44.9 Chronic obstructive pulmonary disease, unspecified 09/16/2022 No Yes I73.9 Peripheral vascular disease, unspecified 09/16/2022 No Yes BURNADETTE, BASKETT A ([id number]) [phone].pdf Page 4 of 8 M86.60 Other chronic osteomyelitis, unspecified site 09/16/2022 No Yes Inactive Problems Resolved Problems Electronic Signature(s) Signed: 09/16/2022 3:18:07 PM By: Duanne Guess MD FACS Previous Signature: 09/16/2022 12:50:18  PM Version By: Duanne Guess MD FACS Entered By: Duanne Guess on 09/16/2022 15:18:07 -------------------------------------------------------------------------------- Progress Note Details Patient Name: Date of Service: Faith [name], [name] 09/16/2022 12:45 PM Medical Record Number: [id number] Patient Account Number: [id number] Date of Birth/Sex: Treating RN: 03-30-1955 (67 y.o. F) Primary Care Provider: INC, PA CE Other Clinician: Referring Provider: Treating Provider/Extender: Darnelle Spangle, PA CE Weeks in Treatment: 0 Subjective Chief Complaint Information obtained from Patient 09/07/2021; stage IV sacral decubitus ulcer 09/15/2021: returns with same, unchanged from prior History of Present Illness (HPI) Admission 09/07/2021 Ms. 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 She has been using Hydrofera Blue to the wound bed. She is a patient of [facility]. She follows with Dr. Dorothe Pea. She currently denies any issues and states the wound has been stable. She currently denies signs of infection. She had an MRI of the lumbar spine on 04/12/2021 at that time they noted no evidence of osteomyelitis to the sacrum or lumbar spine. She reports sleeping in a recliner. She does have a hospital bed but does not have an air mattress. She is able  to ambulate with a walker however mostly remains wheelchair-bound. 10/20/2021; patient presents for follow-up. She reports that the wound VAC was started however quickly discontinued due to green drainage. She is currently using Dakin's wet-to-dry dressings. She was admitted to the hospital on 09/19/2021 for sepsis secondary to her stage IV sacral ulcer. She is currently on p.o. cefdinir and doxycycline for total of 6 weeks And following with infectious disease. During the admission it was recommended that she follow-up with palliative care as an outpatient. READMISSION 09/16/2022: The patient returns with the same wound for which she was seen previously. There really has been no change to the site. She continues to spend roughly 90% of her time in a recliner such that the area is not being adequately offloaded. She continues to smoke. She does have a low-air-loss mattress but if she is on it, she is mostly on her back. She reports drinking 3-4 ensures per day. Her wound is being packed with Dakin's moistened gauze. Patient History Information obtained from Chart. Allergies Darvon (Reaction: hallicuations), penicillin (Reaction: rash) Family History Cancer - Maternal Grandparents, Diabetes - Mother, Heart Disease - Mother,Father,Siblings,Maternal Grandparents, Hypertension - Mother,Father,Siblings, Kidney Disease - Mother, Seizures - Siblings. Social History Current every day smoker - 6 cigarettes a day, Marital Status - Single, Alcohol Use - Never, Drug Use - No History, Caffeine Use - Never. Medical History Hematologic/Lymphatic Patient has history of Anemia Respiratory Patient has history of Chronic Obstructive Pulmonary Disease (COPD) Cardiovascular Patient has history of Hypertension Musculoskeletal Patient has history of Osteoarthritis, Osteomyelitis CHESSIE, NEUHARTH A ([id number]) [id number] Page 5 of 8 Hospitalization/Surgery History - 01/19/2019 cervical  laminectomy. - ORIF facial fx. - 03/2020 cardioversion. - sepsis 7/22-7/25/2023 UTI. Medical A Surgical History Notes nd Constitutional Symptoms (General Health) MS Respiratory emphysema Cardiovascular hyperlipidemia PVD aortic atherosclerosis Musculoskeletal MS DJD cervical myelopathy spondylolisthesis of the cervical region. 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 Ear/Nose/Mouth/Throat Denies complaints or symptoms of Chronic sinus problems or rhinitis. Gastrointestinal Denies complaints or symptoms of Frequent diarrhea, Nausea, Vomiting. Endocrine Denies complaints or symptoms of Heat/cold intolerance. Genitourinary Denies complaints or symptoms of Frequent  urination. Integumentary (Skin) Complains or has symptoms of Wounds. Psychiatric Denies complaints or symptoms of Claustrophobia. Objective Constitutional No acute distress. Vitals Time Taken: 1:04 PM, Height: 64 in, Source: Stated, Weight: 94 lbs, Source: Stated, BMI: 16.1, Temperature: 97.7 F, Pulse: 67 bpm, Respiratory Rate: 18 breaths/min, Blood Pressure: 106/72 mmHg. Respiratory Normal work of breathing on room air. General Notes: There is a stage IV pressure ulcer on the patient's sacrum. I can feel bone underneath a very thin layer of tissue. There is circumferential undermining. The tissues are pale but moist. There is surrounding purple discoloration consistent with pressure-induced tissue injury. Integumentary (Hair, Skin) Wound #3 status is Open. Original cause of wound was Pressure Injury. The date acquired was: 03/01/2020. The wound is located on the Sacrum. The wound measures 3cm length x 3.5cm width x 0.7cm depth; 8.247cm^2 area and 5.773cm^3 volume. There is Fat Layer (Subcutaneous Tissue) exposed. There is no tunneling noted, however, there is undermining starting at  12:00 and ending at 12:00 with a maximum distance of 2.5cm. There is a medium amount of serosanguineous drainage noted. The wound margin is distinct with the outline attached to the wound base. There is large (67-100%) pink granulation within the wound bed. There is a small (1-33%) amount of necrotic tissue within the wound bed including Adherent Slough. The periwound skin appearance had no abnormalities noted for texture. The periwound skin appearance had no abnormalities noted for moisture. The periwound skin appearance exhibited: Rubor. Periwound temperature was noted as No Abnormality. Assessment Active Problems ICD-10 Pressure ulcer of sacral region, stage 4 Multiple sclerosis Chronic obstructive pulmonary disease, unspecified Peripheral vascular disease, unspecified Other chronic osteomyelitis, unspecified site Plan Follow-up Appointments: Return Appointment in 1 week. - Dr. Lady Gary - room 2 - 8/1 at 12:45 NEEDS ROOM 5 Anesthetic: (In clinic) Topical Lidocaine 4% applied to wound bed Bathing/ Shower/ Hygiene: ELEESHA, PURKEY ([id number]) [id number] Page 6 of 8 May shower and wash wound with soap and water. Off-Loading: Low air-loss mattress (Group 2) Turn and reposition every 2 hours Other: - need to keep pressure off of the wound, staying in the recliner most of the day will cause pressure on the wound Additional Orders / Instructions: Stop/Decrease Smoking - smoking inhibits wound healing Follow Nutritious Diet - keep drinking protein shakes Other: - recommended to start taking vitamin C 500 mg x3 a day and zinc 30-50 mg once a day Home Health: New wound care orders this week; continue Home Health for wound care. May utilize formulary equivalent dressing for wound treatment orders unless otherwise specified. - recommended to start taking vitamin C 500 mg x3 a day and zinc 30-50 mg once a day Other Home Health Orders/Instructions: - [facility] The following medication(s) was prescribed: lidocaine topical 4 % cream cream topical was prescribed at facility WOUND #3: - Sacrum Wound Laterality: Cleanser: Soap and Water 1 x Per Day/30 Days Discharge Instructions: May shower and wash wound with dial antibacterial soap and water prior to dressing change. Cleanser: Vashe 5.8 (oz) 1 x Per Day/30 Days Discharge Instructions: Cleanse the wound with Vashe prior to applying a clean dressing using gauze sponges, not tissue or cotton balls. Prim Dressing: Dakin's Solution 0.25%, 16 (oz) 1 x Per Day/30 Days ary Discharge Instructions: Moisten gauze with Dakin's solution Secondary Dressing: Woven Gauze Sponge, Non-Sterile 4x4 in 1 x Per Day/30 Days Discharge Instructions: Apply over primary dressing as directed. Secondary Dressing: Zetuvit Plus Silicone Border Sacrum Dressing, Sm, 7x7 (in/in) 1 x Per  Day/30 Days Discharge Instructions: Apply silicone border over primary dressing as directed. 09/16/2022: This is a 67 year old woman with multiple sclerosis and a chronic stage IV sacral pressure ulcer. She continues to smoke and does not adequately offload. She does seem to be getting adequate nutrition and there is no sign of wound infection. I am afraid that without significant improvement in her ability to offload the site, this wound will never heal. I had a frank discussion with the patient regarding that. Previously, she had been seen in our clinic and determined to be palliative only and her wound care was left up to [facility]. I told her we would likely end up in the same situation, but that if she wanted to make some effort to try and move toward healing, I would help her but she needed to make as substantial change in her habits as far as offloading goes and that she would need to quit smoking entirely. I have asked her to continue her protein shake intake and add 500 mg of vitamin C 3 times a day and 30 to 50 mg of zinc daily. We  will continue to pack the wound with Dakin's-moistened gauze. She will return in 2 weeks. Electronic Signature(s) Signed: 09/16/2022 3:42:01 PM By: Duanne Guess MD FACS Previous Signature: 09/16/2022 3:41:11 PM Version By: Duanne Guess MD FACS Entered By: Duanne Guess on 09/16/2022 15:42:01 -------------------------------------------------------------------------------- HxROS Details Patient Name: Date of Service: Faith [name], September A. 09/16/2022 12:45 PM Medical Record Number: [id number] Patient Account Number: [id number] Date of Birth/Sex: Treating RN: 21-Dec-1955 (67 y.o. Fredderick Phenix Primary Care Provider: INC, [place] CE Other Clinician: Referring Provider: Treating Provider/Extender: Darnelle Spangle, PA CE Weeks in Treatment: 0 Information Obtained From Chart Constitutional Symptoms (General Health) Complaints and Symptoms: Negative for: Fatigue; Fever; Chills; Marked Weight Change Medical History: Past Medical History Notes: MS Ear/Nose/Mouth/Throat Complaints and Symptoms: Negative for: Chronic sinus problems or rhinitis Gastrointestinal Complaints and Symptoms: Negative for: Frequent diarrhea; Nausea; Vomiting PEGGIE, HORNAK A ([id number]) [id number].pdf Page 7 of 8 Endocrine Complaints and Symptoms: Negative for: Heat/cold intolerance Genitourinary Complaints and Symptoms: Negative for: Frequent urination Integumentary (Skin) Complaints and Symptoms: Positive for: Wounds Psychiatric Complaints and Symptoms: Negative for: Claustrophobia Eyes Hematologic/Lymphatic Medical History: Positive for: Anemia Respiratory Medical History: Positive for: Chronic Obstructive Pulmonary Disease (COPD) Past Medical History Notes: emphysema Cardiovascular Medical History: Positive for: Hypertension Past Medical History Notes: hyperlipidemia PVD aortic atherosclerosis Immunological Musculoskeletal Medical History: Positive for:  Osteoarthritis; Osteomyelitis Past Medical History Notes: MS DJD cervical myelopathy spondylolisthesis of the cervical region. 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 Oncologic Immunizations Pneumococcal Vaccine: Received Pneumococcal Vaccination: Yes Received Pneumococcal Vaccination On or After 60th Birthday: Yes Implantable Devices No devices added Hospitalization / Surgery History Type of Hospitalization/Surgery 01/19/2019 cervical laminectomy ORIF facial fx 03/2020 cardioversion sepsis 7/22-7/25/2023 UTI KAYRA, CROWELL A ([id number]) [phone].pdf Page 8 of 8 Family and Social History Cancer: Yes - Maternal Grandparents; Diabetes: Yes - Mother; Heart Disease: Yes - Mother,Father,Siblings,Maternal Grandparents; Hypertension: Yes - Mother,Father,Siblings; Kidney Disease: Yes - Mother; Seizures: Yes - Siblings; Current every day smoker - 6 cigarettes a day; Marital Status - Single; Alcohol Use: Never; Drug Use: No History; Caffeine Use: Never; Financial Concerns: No; Food, Clothing or Shelter Needs: No; Support System Lacking: No; Transportation Concerns: No Electronic Signature(s) Signed: 09/16/2022 3:55:30 PM By: Samuella Bruin Signed: 09/16/2022 4:23:46 PM By: Duanne Guess MD FACS Entered By: Samuella Bruin on 09/16/2022 13:06:14 -------------------------------------------------------------------------------- SuperBill Details Patient Name:  Date of Service: Faith [name], Faith [name] 09/16/2022 Medical Record Number: [id number] Patient Account Number: [id number] Date of Birth/Sex: Treating RN: Jun 17, 1955 (67 y.o. Fredderick Phenix Primary Care Provider: INC, [place] CE Other Clinician: Referring Provider: Treating Provider/Extender: Darnelle Spangle, PA CE Weeks in Treatment: 0 Diagnosis Coding ICD-10 Codes Code Description L89.154 Pressure ulcer of sacral region, stage 4 G35 Multiple sclerosis J44.9  Chronic obstructive pulmonary disease, unspecified I73.9 Peripheral vascular disease, unspecified M86.60 Other chronic osteomyelitis, unspecified site Facility Procedures : CPT4 Code: [id number] Description: 99213 - WOUND CARE VISIT-LEV 3 EST PT Modifier: Quantity: 1 Physician Procedures : CPT4 Code Description Modifier [id number] 99214 - WC PHYS LEVEL 4 - EST PT ICD-10 Diagnosis Description L89.154 Pressure ulcer of sacral region, stage 4 G35 Multiple sclerosis M86.60 Other chronic osteomyelitis, unspecified site I73.9 Peripheral vascular  disease, unspecified Quantity: 1 Electronic Signature(s) Signed: 09/16/2022 3:42:26 PM By: Duanne Guess MD FACS Entered By: Duanne Guess on 09/16/2022 15:42:26

## 2022-09-16 NOTE — Progress Notes (Signed)
Called patient to reschedule follow-up LDCT. Patient states that she will follow-up with [facility] regarding follow-up LDCT. No further follow-up scheduled. Referral closed per patient request

## 2022-09-16 NOTE — Progress Notes (Signed)
Faith [name], Faith [name] ([id number]) [id number] Page 1 of 4 Visit Report for 09/16/2022 Abuse Risk Screen Details Patient Name: Date of Service: Faith [name], Faith [name] 09/16/2022 12:45 PM Medical Record Number: [id number] Patient Account Number: [id number] Date of Birth/Sex: Treating RN: [birth year] (67 y.o. Fredderick [facility] Clide Remmers: INC, [place] Other Clinician: Referring Sangeeta Youse: Treating Harshith Pursell/Extender: Darnelle Spangle, PA CE Weeks in Treatment: 0 Abuse Risk Screen Items Answer ABUSE RISK SCREEN: Has anyone close to you tried to hurt or harm you recentlyo No Do you feel uncomfortable with anyone in your familyo No Has anyone forced you do things that you didnt want to doo No Electronic Signature(s) Signed: 09/16/2022 3:55:30 PM By: Samuella Bruin Entered By: Samuella Bruin on 09/16/2022 13:06:22 -------------------------------------------------------------------------------- Activities of Daily Living Details Patient Name: Date of Service: Faith [name], Faith [name] 09/16/2022 12:45 PM Medical Record Number: [id number] Patient Account Number: [id number] Date of Birth/Sex: Treating RN: 1955-07-23 (67 y.o. Fredderick [facility]ix Primary [facility] Cayton Cuevas: [employer], [place] Other Clinician: Referring Elianie Hubers: Treating Devesh Monforte/Extender: Darnelle Spangle, PA CE Weeks in Treatment: 0 Activities of Daily Living Items Answer Activities of Daily Living (Please select one for each item) Drive Automobile Not Able T Medications ake Need Assistance Use T elephone Need Assistance Care for Appearance Need Assistance Use T oilet Need Assistance Bath / Shower Need Assistance Dress Self Need Assistance Feed Self Need Assistance Walk Not Able Get In / Out Bed Need Assistance Housework Not Able Prepare Meals Not Able Handle Money Not Able Shop for Self Not Able Electronic Signature(s) Signed: 09/16/2022 3:55:30 PM By: Samuella Bruin Entered By:  Samuella Bruin on 09/16/2022 13:07:21 Faith [name], Faith [name] ([id number]) [id number] Page 2 of 4 -------------------------------------------------------------------------------- Education Screening Details Patient Name: Date of Service: Faith [name], Faith [name] 09/16/2022 12:45 PM Medical Record Number: [id number] Patient Account Number: [id number] Date of Birth/Sex: Treating RN: 1955/06/29 (67 y.o. Fredderick [facility]ix Primary [facility] Brinson Tozzi: [employer], [place] Other Clinician: Referring Luqman Perrelli: Treating Jaimere Feutz/Extender: Darnelle Spangle, PA CE Weeks in Treatment: 0 Primary Learner Assessed: Patient Learning Preferences/Education Level/Primary Language Learning Preference: Explanation, Demonstration, Video, Printed Material Highest Education Level: High School Preferred Language: English Cognitive Barrier Language Barrier: No Translator Needed: No Memory Deficit: No Emotional Barrier: No Cultural/Religious Beliefs Affecting Medical Care: No Physical Barrier Impaired Vision: Yes Impaired Hearing: No Decreased Hand dexterity: No Knowledge/Comprehension Knowledge Level: Medium Comprehension Level: Medium Ability to understand written instructions: Medium Ability to understand verbal instructions: Medium Motivation Anxiety Level: Calm Cooperation: Cooperative Education Importance: Acknowledges Need Interest in Health Problems: Asks Questions Perception: Coherent Willingness to Engage in Self-Management Medium Activities: Readiness to Engage in Self-Management Medium Activities: Electronic Signature(s) Signed: 09/16/2022 3:55:30 PM By: Samuella Bruin Entered By: Samuella Bruin on 09/16/2022 13:07:39 -------------------------------------------------------------------------------- Fall Risk Assessment Details Patient Name: Date of Service: Faith [name], Faith [name]. 09/16/2022 12:45 PM Medical Record Number: [id number] Patient Account Number: [id number] Date  of Birth/Sex: Treating RN: [birth year] (67 y.o. Fredderick Phenix Primary [facility] Koron Godeaux: [employer], [place] Other Clinician: Referring Donnabelle Blanchard: Treating Brenin Heidelberger/Extender: Darnelle Spangle, PA CE Weeks in Treatment: 0 Fall Risk Assessment Items Have you had 2 or more falls in the last 12 monthso 0 Yes Faith [name], Faith [name] ([id number]) [phone] [id number] Page 3 of 4 Have you had any fall that resulted in injury in the last 12 monthso 0 No FALLS RISK SCREEN History of falling - immediate or within 3 months 0 No Secondary diagnosis (Do you have 2 or more medical diagnoseso) 15 Yes  Ambulatory aid None/bed rest/wheelchair/nurse 0 Yes Crutches/cane/walker 0 No Furniture 0 No Intravenous therapy Access/Saline/Heparin Lock 0 No Gait/Transferring Normal/ bed rest/ wheelchair 0 Yes Weak (short steps with or without shuffle, stooped but able to lift head while walking, may seek 0 No support from furniture) Impaired (short steps with shuffle, may have difficulty arising from chair, head down, impaired 0 No balance) Mental Status Oriented to own ability 0 Yes Electronic Signature(s) Signed: 09/16/2022 3:55:30 PM By: Samuella Bruin Entered By: Samuella Bruin on 09/16/2022 13:08:00 -------------------------------------------------------------------------------- Foot Assessment Details Patient Name: Date of Service: Faith Rous [name]. 09/16/2022 12:45 PM Medical Record Number: [id number] Patient Account Number: [id number] Date of Birth/Sex: Treating RN: 08-11-1955 (67 y.o. Fredderick [facility] Primary [facility] Sondra Blixt: [employer], [place] Other Clinician: Referring Tunis Gentle: Treating Kyrie Bun/Extender: Darnelle Spangle, PA CE Weeks in Treatment: 0 Foot Assessment Items Site Locations + = Sensation present, - = Sensation absent, C = Callus, U = Ulcer R = Redness, W = Warmth, M = Maceration, PU = Pre-ulcerative lesion F = Fissure, S = Swelling, D = Dryness Assessment Right:  Left: Other Deformity: No No Prior Foot Ulcer: No No Prior Amputation: No No Charcot Joint: No No Ambulatory Status: Non-ambulatory Assistance Device: Wheelchair Bradley [name] ([id number]) [phone].pdf Page 4 of 4 Gait: Electronic Signature(s) Signed: 09/16/2022 3:55:30 PM By: Samuella Bruin Entered By: Samuella Bruin on 09/16/2022 13:08:41 -------------------------------------------------------------------------------- Nutrition Risk Screening Details Patient Name: Date of Service: Faith [name], Faith [name] 09/16/2022 12:45 PM Medical Record Number: [id number] Patient Account Number: [id number] Date of Birth/Sex: Treating RN: 01/26/1956 (67 y.o. Fredderick [facility]ix Primary [facility] Laruth Hanger: [employer], [place] Other Clinician: Referring Truman Aceituno: Treating Kiley Solimine/Extender: Darnelle Spangle, PA CE Weeks in Treatment: 0 Height (in): 64 Weight (lbs): 94 Body Mass Index (BMI): 16.1 Nutrition Risk Screening Items Score Screening NUTRITION RISK SCREEN: I have an illness or condition that made me change the kind and/or amount of food I eat 0 No I eat fewer than two meals per day 0 No I eat few fruits and vegetables, or milk products 0 No I have three or more drinks of beer, liquor or wine almost every day 0 No I have tooth or mouth problems that make it hard for me to eat 0 No I don't always have enough money to buy the food I need 0 No I eat alone most of the time 0 No I take three or more different prescribed or over-the-counter drugs [name] day 1 Yes Without wanting to, I have lost or gained 10 pounds in the last six months 0 No I am not always physically able to shop, cook and/or feed myself 2 Yes Nutrition Protocols Good Risk Protocol Moderate Risk Protocol 0 Provide education on nutrition High Risk Proctocol Risk Level: Moderate Risk Score: 3 Electronic Signature(s) Signed: 09/16/2022 3:55:30 PM By: Samuella Bruin Entered By: Samuella Bruin on  09/16/2022 13:08:33

## 2022-09-16 NOTE — Progress Notes (Signed)
ALEASE, FAIT [name] ([id number]) [id number] Page 1 of 9 Visit Report for 09/16/2022 Allergy List Details Patient Name: Date of Service: Faith [name], Faith [name] 09/16/2022 12:45 PM Medical Record Number: [id number] Patient Account Number: [id number] Date of Birth/Sex: Treating RN: Jun 02, 1955 (67 y.o. Fredderick Phenix Primary Care Makail Watling: INC, [place] Other Clinician: Referring Bexley Laubach: Treating Jalacia Mattila/Extender: Darnelle Spangle, PA CE Weeks in Treatment: 0 Allergies Active Allergies Darvon Reaction: hallicuations penicillin Reaction: rash Allergy Notes Electronic Signature(s) Signed: 09/16/2022 3:55:30 PM By: Samuella Bruin Entered By: Samuella Bruin on 09/16/2022 13:04:59 -------------------------------------------------------------------------------- Arrival Information Details Patient Name: Date of Service: Faith [name], Faith [name]. 09/16/2022 12:45 PM Medical Record Number: [id number] Patient Account Number: [id number] Date of Birth/Sex: Treating RN: [birth year] (67 y.o. Fredderick Phenix Primary Care Shaima Sardinas: [place], [place] Other Clinician: Referring Julizza Sassone: Treating Mileena Rothenberger/Extender: Darnelle Spangle, PA CE Weeks in Treatment: 0 Visit Information Patient Arrived: Wheel Chair Arrival Time: 13:03 Accompanied By: self Transfer Assistance: Manual Patient Identification Verified: Yes Secondary Verification Process Completed: Yes Patient Requires Transmission-Based Precautions: No Patient Has Alerts: No History Since Last Visit Electronic Signature(s) Signed: 09/16/2022 3:55:30 PM By: Samuella Bruin Entered By: Samuella Bruin on 09/16/2022 13:04:15 Ehler, Quentina [name] ([id number]) [id number] Page 2 of 9 -------------------------------------------------------------------------------- Clinic Level of Care Assessment Details Patient Name: Date of Service: Faith [name], Faith [name] 09/16/2022 12:45 PM Medical Record Number:  [id number] Patient Account Number: [id number] Date of Birth/Sex: Treating RN: [birth year] (67 y.o. Fredderick Phenix Primary Care Terrin Meddaugh: [employer], [place] Other Clinician: Referring Zander Ingham: Treating Curren Mohrmann/Extender: Darnelle Spangle, PA CE Weeks in Treatment: 0 Clinic Level of Care Assessment Items TOOL 1 Quantity Score X- 1 0 Use when EandM and Procedure is performed on INITIAL visit ASSESSMENTS - Nursing Assessment / Reassessment X- 1 20 General Physical Exam (combine w/ comprehensive assessment (listed just below) when performed on new pt. evals) X- 1 25 Comprehensive Assessment (HX, ROS, Risk Assessments, Wounds Hx, etc.) ASSESSMENTS - Wound and Skin Assessment / Reassessment []  - 0 Dermatologic / Skin Assessment (not related to wound area) ASSESSMENTS - Ostomy and/or Continence Assessment and Care []  - 0 Incontinence Assessment and Management []  - 0 Ostomy Care Assessment and Management (repouching, etc.) PROCESS - Coordination of Care X - Simple Patient / Family Education for ongoing care 1 15 []  - 0 Complex (extensive) Patient / Family Education for ongoing care X- 1 10 Staff obtains Chiropractor, Records, T Results / Process Orders est []  - 0 Staff telephones HHA, Nursing Homes / Clarify orders / etc []  - 0 Routine Transfer to another Facility (non-emergent condition) []  - 0 Routine Hospital Admission (non-emergent condition) X- 1 15 New Admissions / Manufacturing engineer / Ordering NPWT Apligraf, etc. , []  - 0 Emergency Hospital Admission (emergent condition) PROCESS - Special Needs []  - 0 Pediatric / Minor Patient Management []  - 0 Isolation Patient Management []  - 0 Hearing / Language / Visual special needs []  - 0 Assessment of Community assistance (transportation, D/C planning, etc.) []  - 0 Additional assistance / Altered mentation X- 1 15 Support Surface(s) Assessment (bed, cushion, seat, etc.) INTERVENTIONS - Miscellaneous []  - 0 External  ear exam []  - 0 Patient Transfer (multiple staff / Nurse, adult / Similar devices) []  - 0 Simple Staple / Suture removal (25 or less) []  - 0 Complex Staple / Suture removal (26 or more) []  - 0 Hypo/Hyperglycemic Management (do not check if billed separately) []  - 0 Ankle / Brachial Index (ABI) - do not check if billed  separately Has the patient been seen at the hospital within the last three years: Yes Total Score: 100 Level Of Care: New/Established - Level 3 Electronic Signature(s) Signed: 09/16/2022 3:55:30 PM By: Samuella Bruin Entered By: Samuella Bruin on 09/16/2022 14:23:12 Mchale, Dorothy [name] ([id number]) [id number] Page 3 of 9 -------------------------------------------------------------------------------- Encounter Discharge Information Details Patient Name: Date of Service: Faith [name], Faith [name] 09/16/2022 12:45 PM Medical Record Number: [id number] Patient Account Number: [id number] Date of Birth/Sex: Treating RN: 1955-05-24 (67 y.o. Fredderick Phenix Primary Care Lisette Mancebo: [employer], [place] Other Clinician: Referring Starasia Sinko: Treating Benjiman Sedgwick/Extender: Darnelle Spangle, PA CE Weeks in Treatment: 0 Encounter Discharge Information Items Discharge Condition: Stable Ambulatory Status: Wheelchair Discharge Destination: Home Transportation: Private Auto Accompanied By: self Schedule Follow-up Appointment: Yes Clinical Summary of Care: Patient Declined Electronic Signature(s) Signed: 09/16/2022 3:55:30 PM By: Gelene Mink By: Samuella Bruin on 09/16/2022 14:23:43 -------------------------------------------------------------------------------- Lower Extremity Assessment Details Patient Name: Date of Service: Faith [name], Faith [name] 09/16/2022 12:45 PM Medical Record Number: [id number] Patient Account Number: [id number] Date of Birth/Sex: Treating RN: 04/13/1955 (68 y.o. Fredderick Phenix Primary Care Traevon Meiring: INC, [place] Other  Clinician: Referring Lasheka Kempner: Treating Staisha Winiarski/Extender: Darnelle Spangle, PA CE Weeks in Treatment: 0 Electronic Signature(s) Signed: 09/16/2022 3:55:30 PM By: Gelene Mink By: Samuella Bruin on 09/16/2022 13:08:45 -------------------------------------------------------------------------------- Multi Wound Chart Details Patient Name: Date of Service: Faith Rous [name]. 09/16/2022 12:45 PM Medical Record Number: [id number] Patient Account Number: [id number] Date of Birth/Sex: Treating RN: 1955-06-14 (67 y.o. F) Primary Care Quantrell Splitt: INC, [place] Other Clinician: Referring Divya Munshi: Treating Judyth Demarais/Extender: Darnelle Spangle, PA CE Weeks in Treatment: 0 Vital Signs Height(in): 64 Pulse(bpm): 67 Weight(lbs): 94 Blood Pressure(mmHg): 106/72 Body Mass Index(BMI): 16.1 Temperature(F): 97.7 Respiratory Rate(breaths/min): 18 Faith [name], Faith [name] ([id number]) [id number] Page 4 of 9 [3:Photos:] [N/[name]:N/[name]] Sacrum N/[name] N/[name] Wound Location: Pressure Injury N/[name] N/[name] Wounding Event: Pressure Ulcer N/[name] N/[name] Primary Etiology: Anemia, Chronic Obstructive N/[name] N/[name] Comorbid History: Pulmonary Disease (COPD), Hypertension, Osteoarthritis, Osteomyelitis 03/01/2020 N/[name] N/[name] Date Acquired: 0 N/[name] N/[name] Weeks of Treatment: Open N/[name] N/[name] Wound Status: No N/[name] N/[name] Wound Recurrence: 3x3.5x0.7 N/[name] N/[name] Measurements L x W x D (cm) 8.247 N/[name] N/[name] [name] (cm) : rea 5.773 N/[name] N/[name] Volume (cm) : 12 Starting Position 1 (o'clock): 12 Ending Position 1 (o'clock): 2.5 Maximum Distance 1 (cm): Yes N/[name] N/[name] Undermining: Category/Stage III N/[name] N/[name] Classification: Medium N/[name] N/[name] Exudate [name] mount: Serosanguineous N/[name] N/[name] Exudate Type: red, Adames N/[name] N/[name] Exudate Color: Distinct, outline attached N/[name] N/[name] Wound Margin: Large (67-100%) N/[name] N/[name] Granulation [name] mount: Pink N/[name] N/[name] Granulation Quality: Small (1-33%) N/[name] N/[name] Necrotic [name] mount: Fat Layer (Subcutaneous  Tissue): Yes N/[name] N/[name] Exposed Structures: Fascia: No Tendon: No Muscle: No Joint: No Bone: No None N/[name] N/[name] Epithelialization: No Abnormalities Noted N/[name] N/[name] Periwound Skin Texture: No Abnormalities Noted N/[name] N/[name] Periwound Skin Moisture: Rubor: Yes N/[name] N/[name] Periwound Skin Color: No Abnormality N/[name] N/[name] Temperature: Treatment Notes Wound #3 (Sacrum) Cleanser Soap and Water Discharge Instruction: May shower and wash wound with dial antibacterial soap and water prior to dressing change. Vashe 5.8 (oz) Discharge Instruction: Cleanse the wound with Vashe prior to applying [name] clean dressing using gauze sponges, not tissue or cotton balls. Peri-Wound Care Topical Primary Dressing Dakin's Solution 0.25%, 16 (oz) Discharge Instruction: Moisten gauze with Dakin's solution Secondary Dressing Woven Gauze Sponge, Non-Sterile 4x4 in Discharge Instruction: Apply over primary dressing as directed. Zetuvit Plus Silicone Border Sacrum Dressing, Sm, 7x7 (in/in) Discharge Instruction: Apply  silicone border over primary dressing as directed. Secured With Compression Wrap Compression Stockings Faith [name], Faith [name] ([id number]) [id number] Page 5 of 9 Add-Ons Electronic Signature(s) Signed: 09/16/2022 3:18:22 PM By: Duanne Guess MD FACS Entered By: Duanne Guess on 09/16/2022 15:18:21 -------------------------------------------------------------------------------- Multi-Disciplinary Care Plan Details Patient Name: Date of Service: Faith [name], Waynetta Sandy [name]. 09/16/2022 12:45 PM Medical Record Number: [id number] Patient Account Number: [id number] Date of Birth/Sex: Treating RN: [birth year] (67 y.o. Fredderick Phenix Primary Care Mayela Bullard: INC, [place] Other Clinician: Referring Savonna Birchmeier: Treating Infinity Jeffords/Extender: Darnelle Spangle, PA CE Weeks in Treatment: 0 Active Inactive Pressure Nursing Diagnoses: Knowledge deficit related to causes and risk factors for pressure ulcer  development Knowledge deficit related to management of pressures ulcers Potential for impaired tissue integrity related to pressure, friction, moisture, and shear Goals: Patient/caregiver will verbalize risk factors for pressure ulcer development Date Initiated: 09/16/2022 Target Resolution Date: 11/05/2022 Goal Status: Active Patient/caregiver will verbalize understanding of pressure ulcer management Date Initiated: 09/16/2022 Target Resolution Date: 11/05/2022 Goal Status: Active Interventions: Assess: immobility, friction, shearing, incontinence upon admission and as needed Assess offloading mechanisms upon admission and as needed Assess potential for pressure ulcer upon admission and as needed Provide education on pressure ulcers Notes: Wound/Skin Impairment Nursing Diagnoses: Impaired tissue integrity Knowledge deficit related to ulceration/compromised skin integrity Goals: Patient/caregiver will verbalize understanding of skin care regimen Date Initiated: 09/16/2022 Target Resolution Date: 11/05/2022 Goal Status: Active Interventions: Assess patient/caregiver ability to obtain necessary supplies Assess ulceration(s) every visit Treatment Activities: Skin care regimen initiated : 09/16/2022 Topical wound management initiated : 09/16/2022 Notes: Electronic Signature(s) Signed: 09/16/2022 3:55:30 PM By: Darcel Bayley, Waynetta Sandy [name] ([id number]) [id number] Page 6 of 9 Entered By: Samuella Bruin on 09/16/2022 13:27:23 -------------------------------------------------------------------------------- Pain Assessment Details Patient Name: Date of Service: Faith [name], Faith [name] 09/16/2022 12:45 PM Medical Record Number: [id number] Patient Account Number: [id number] Date of Birth/Sex: Treating RN: 24-May-1955 (67 y.o. Fredderick Phenix Primary Care Bessy Reaney: INC, [place] Other Clinician: Referring Laurna Shetley: Treating Alora Gorey/Extender: Darnelle Spangle, PA  CE Weeks in Treatment: 0 Active Problems Location of Pain Severity and Description of Pain Patient Has Paino No Site Locations Rate the pain. Current Pain Level: 0 Pain Management and Medication Current Pain Management: Electronic Signature(s) Signed: 09/16/2022 3:55:30 PM By: Samuella Bruin Entered By: Samuella Bruin on 09/16/2022 13:08:53 -------------------------------------------------------------------------------- Patient/Caregiver Education Details Patient Name: Date of Service: Faith [name] 7/18/2024andnbsp12:45 PM Medical Record Number: [id number] Patient Account Number: [id number] Date of Birth/Gender: Treating RN: 1955/06/27 (67 y.o. Fredderick Phenix Primary Care Physician: INC, [place] Other Clinician: Referring Physician: Treating Physician/Extender: Darnelle Spangle, PA CE Weeks in Treatment: 0 Education Assessment Education Provided To: Patient Education Topics Provided Wound Debridement: Faith [name], Faith [name] ([id number]) [id number] Page 7 of 9 Methods: Explain/Verbal Responses: Reinforcements needed, State content correctly Wound/Skin Impairment: Methods: Explain/Verbal Responses: Reinforcements needed, State content correctly Electronic Signature(s) Signed: 09/16/2022 3:55:30 PM By: Samuella Bruin Entered By: Samuella Bruin on 09/16/2022 13:27:35 -------------------------------------------------------------------------------- Wound Assessment Details Patient Name: Date of Service: Faith Rous [name]. 09/16/2022 12:45 PM Medical Record Number: [id number] Patient Account Number: [id number] Date of Birth/Sex: Treating RN: August 13, 1955 (67 y.o. Fredderick Phenix Primary Care Anastazja Isaac: [employer], [place] Other Clinician: Referring Avonelle Viveros: Treating Rozelle Caudle/Extender: Darnelle Spangle, PA CE Weeks in Treatment: 0 Wound Status Wound Number: 3 Primary Pressure Ulcer Etiology: Wound Location: Sacrum Wound Open Wounding  Event: Pressure Injury Status: Date Acquired: 03/01/2020 Comorbid Anemia, Chronic Obstructive Pulmonary Disease (COPD), Weeks Of Treatment: 0 History: Hypertension, Osteoarthritis, Osteomyelitis  Clustered Wound: No Photos Wound Measurements Length: (cm) 3 Width: (cm) 3.5 Depth: (cm) 0.7 Area: (cm) 8.247 Volume: (cm) 5.773 % Reduction in Area: % Reduction in Volume: Epithelialization: None Tunneling: No Undermining: Yes Starting Position (o'clock): 12 Ending Position (o'clock): 12 Maximum Distance: (cm) 2.5 Wound Description Classification: Category/Stage III Wound Margin: Distinct, outline attached Exudate Amount: Medium Exudate Type: Serosanguineous Exudate Color: red, [place] Foul Odor After Cleansing: No Slough/Fibrino Yes Wound Bed Granulation Amount: Large (67-100%) Exposed Structure Granulation Quality: Pink Fascia Exposed: No Necrotic Amount: Small (1-33%) Fat Layer (Subcutaneous Tissue) Exposed: Yes Faith [name], Faith [name] ([id number]) [id number] Page 8 of 9 Necrotic Quality: Adherent Slough Tendon Exposed: No Muscle Exposed: No Joint Exposed: No Bone Exposed: No Periwound Skin Texture Texture Color No Abnormalities Noted: Yes No Abnormalities Noted: No Rubor: Yes Moisture No Abnormalities Noted: Yes Temperature / Pain Temperature: No Abnormality Treatment Notes Wound #3 (Sacrum) Cleanser Soap and Water Discharge Instruction: May shower and wash wound with dial antibacterial soap and water prior to dressing change. Vashe 5.8 (oz) Discharge Instruction: Cleanse the wound with Vashe prior to applying [name] clean dressing using gauze sponges, not tissue or cotton balls. Peri-Wound Care Topical Primary Dressing Dakin's Solution 0.25%, 16 (oz) Discharge Instruction: Moisten gauze with Dakin's solution Secondary Dressing Woven Gauze Sponge, Non-Sterile 4x4 in Discharge Instruction: Apply over primary dressing as directed. Zetuvit Plus  Silicone Border Sacrum Dressing, Sm, 7x7 (in/in) Discharge Instruction: Apply silicone border over primary dressing as directed. Secured With Compression Wrap Compression Stockings Facilities manager) Signed: 09/16/2022 3:55:30 PM By: Samuella Bruin Entered By: Samuella Bruin on 09/16/2022 13:28:17 -------------------------------------------------------------------------------- Vitals Details Patient Name: Date of Service: Faith [name], Tanika [name]. 09/16/2022 12:45 PM Medical Record Number: [id number] Patient Account Number: [id number] Date of Birth/Sex: Treating RN: 12-20-1955 (67 y.o. Fredderick Phenix Primary Care Zackery Brine: INC, [place] Other Clinician: Referring Tashawna Thom: Treating Dayrin Stallone/Extender: Darnelle Spangle, PA CE Weeks in Treatment: 0 Vital Signs Time Taken: 13:04 Temperature (F): 97.7 Height (in): 64 Pulse (bpm): 67 Source: Stated Respiratory Rate (breaths/min): 18 Weight (lbs): 94 Blood Pressure (mmHg): 106/72 Source: Stated Reference Range: 80 - 120 mg / dl Body Mass Index (BMI): 16.1 Wix, Fredericka [name] ([id number]) [id number] Page 9 of 9 Electronic Signature(s) Signed: 09/16/2022 3:55:30 PM By: Samuella Bruin Entered By: Samuella Bruin on 09/16/2022 13:04:51

## 2022-09-30 ENCOUNTER — Ambulatory Visit (HOSPITAL_BASED_OUTPATIENT_CLINIC_OR_DEPARTMENT_OTHER): Payer: Medicare (Managed Care) | Admitting: General Surgery

## 2022-10-06 ENCOUNTER — Ambulatory Visit (INDEPENDENT_AMBULATORY_CARE_PROVIDER_SITE_OTHER): Payer: Medicare (Managed Care) | Admitting: Neurology

## 2022-10-06 ENCOUNTER — Encounter: Payer: Self-pay | Admitting: Neurology

## 2022-10-06 VITALS — BP 123/79 | HR 91

## 2022-10-06 DIAGNOSIS — G35 Multiple sclerosis: Secondary | ICD-10-CM

## 2022-10-06 DIAGNOSIS — G8220 Paraplegia, unspecified: Secondary | ICD-10-CM | POA: Diagnosis not present

## 2022-10-06 DIAGNOSIS — Z79899 Other long term (current) drug therapy: Secondary | ICD-10-CM

## 2022-10-06 DIAGNOSIS — G959 Disease of spinal cord, unspecified: Secondary | ICD-10-CM

## 2022-10-06 DIAGNOSIS — R208 Other disturbances of skin sensation: Secondary | ICD-10-CM

## 2022-10-06 DIAGNOSIS — R5383 Other fatigue: Secondary | ICD-10-CM

## 2022-10-06 MED ORDER — MODAFINIL 200 MG PO TABS: 200.0000 mg | ORAL_TABLET | Freq: Every morning | ORAL | 5 refills | Status: DC

## 2022-10-06 NOTE — Progress Notes (Signed)
[facility]  PATIENT: Faith [name] DOB: 08-02-1955  REFERRING DOCTOR OR PCP: Marny Lowenstein, MD SOURCE: Patient, notes from PCP, notes from Dr. Gerilyn Pilgrim, note from recent hospital stay, imaging and lab reports, MRI images personally reviewed  _________________________________   HISTORICAL  CHIEF COMPLAINT:  Chief Complaint  Patient presents with   Room 10    Pt is here Alone. Pt states that the [facility] hasn't helped her at all. Pt states that she had gotten sick after taken it and she has been weak. Pt states that she wants to talk about maybe trying a steroid to help with her weakness. Pt states that her right arm was weak that happened about 1 week ago.      HISTORY OF PRESENT ILLNESS:  Faith [name] is a 67 y.o. woman with multiple sclerosis.  Update 10/06/2022: She completed the second year of Mavenclad in June.  She had more GI issues and more fatigue.   She took [facility] March and April 2023 (4 pills each of the cycles).  She had lymphocytes 0.6 on 7/22.2023 and 0.8 (normal)12/02/2021 ,  0.5 04/09/2022 an back to normal 1.9 05/26/2022.  She does not feel any effect of the Mavenclad either way.    She has active secondary progressive MS and has slowly worsened strength over the past couple years   if she has only been treated for a few years, on Tysabri for about 2 years and receiving 1 dose of Ocrevus.     She spends most of her time in the wheelchair. Arms are strong though legs are very weak and she can transfer from chair to recliner or bedside commode.   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     She notes numbness in both legs.     She is on baclofen for spasticity, propranolol for tremor and gabapentin for dysesthesias.  She also uses weighted utensils for her tremor.   Bladder function is fine.  She has a sacral decubitus that is being followed by wound management .  It is better than last year and almost healed    Vision is poor (bilateral).   She feels it is  stable  Cognition is doing well.      She notes more fatigue especially       She denies depression.   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      MS History: She was diagnosed with MS in 2007 after presenting with gradual worsening of gait.  She had noted progressive worsening of gait before that year but the change seemed more rapid at the time.  Two to three years earlier she had no major disability.    She saw Dr. Gerilyn Pilgrim in [place] who diagnosed er with MS.   She received IV steroids.   She was placed on Tysabri and felt she was stable x 2 years.   However, due to JCV it was stopped.   She was switched to Ocrevus but felt very poorly and feverish so did not do the second half of the dose.   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   She did not resume any medication after that one dose.    She feels she worsened after the surgery,  She was using a walker before then and needed a wheelchair afterwards.  She had her first course of Mavenclad March and April 2023.  She tolerated it well.  The second year of Mavenclad  will be April and May 2024   Imaging personally reviewed: MRI of the brain 02/29/2020 shows scattered T2/FLAIR hyperintense foci in the hemispheres consistent with MS.  None of the foci enhanced.  There was mild atrophy.  Only 1 infratentorial focus in the right posterior pons.  MRI of the cervical spine 02/29/2020 showed a T2 hyperintense focus posteriorly towards the right adjacent to C4.  She also has fusion with posterior fixation at C3-C4 and foraminal narrowing to the left involving C7, to the left at C3 and bilaterally at C6.  The effusion resolved the spinal stenosis with possible myelopathy noted on her previous MRI  MRI of the thoracic spine 03/01/2020 showed multiple T2 hyperintense foci within the spinal cord most defined at T7, T10 and T11..  There was no enhancement.  No significant degenerative change.  MRI of the thoracic spine  04/12/2021 was unchanged compared to 03/01/2020.  MRI of the lumbar spine 04/12/2021 showed disc protrusion at L3-L4 that could affect the right L3 nerve root.  REVIEW OF SYSTEMS: Constitutional: No fevers, chills, sweats, or change in appetite Eyes: No visual changes, double vision, eye pain Ear, nose and throat: No hearing loss, ear pain, nasal congestion, sore throat Cardiovascular: No chest pain, palpitations Respiratory:  No shortness of breath at rest or with exertion.   No wheezes GastrointestinaI: No nausea, vomiting, diarrhea, abdominal pain, fecal incontinence Genitourinary:  No dysuria, urinary retention or frequency.  No nocturia. Musculoskeletal:  No neck pain, back pain Integumentary: No rash, pruritus, skin lesions Neurological: as above Psychiatric: No depression at this time.  No anxiety Endocrine: No palpitations, diaphoresis, change in appetite, change in weigh or increased thirst Hematologic/Lymphatic: She has had anemia.. Allergic/Immunologic: No itchy/runny eyes, nasal congestion, recent allergic reactions, rashes  ALLERGIES: Allergies  Allergen Reactions   Darvon [Propoxyphene] Other (See Comments)    Hallicuations   Penicillins Rash    Did it involve swelling of the face/tongue/throat, SOB, or low BP? Unknown Did it involve sudden or severe rash/hives, skin peeling, or any reaction on the inside of your mouth or nose? Unknown Did you need to seek medical attention at a hospital or doctor's office? Unknown When did it last happen?      Unknown If all above answers are "NO", may proceed with cephalosporin use.     HOME MEDICATIONS:  Current Outpatient Medications:    acetaminophen (TYLENOL) 650 MG CR tablet, Take 1,300 mg by mouth in the morning and at bedtime. Take 2 tablets by mouth BID for chronic pain, Disp: , Rfl:    ascorbic acid (VITAMIN C) 500 MG tablet, Take 1 tablet (500 mg total) by mouth daily., Disp: , Rfl:    atorvastatin (LIPITOR) 20 MG tablet,  Take 20 mg by mouth daily., Disp: , Rfl:    baclofen (LIORESAL) 10 MG tablet, Take 1 tablet (10 mg total) by mouth 3 (three) times daily., Disp: 10 each, Rfl: 0   celecoxib (CELEBREX) 200 MG capsule, Take 1 capsule (200 mg total) by mouth 2 (two) times daily as needed., Disp: 60 capsule, Rfl: 2   chlorhexidine (PERIDEX) 0.12 % solution, Use as directed 15 mLs in the mouth or throat 4 (four) times daily., Disp: , Rfl:    Cholecalciferol (VITAMIN D3) 50 MCG (2000 UT) capsule, Take 2,000 Units by mouth daily., Disp: , Rfl:    Cladribine, 4 Tabs, (MAVENCLAD, 4 TABS,) 10 MG TBPK, Take 10 mg by mouth daily. Take 10mg  by mouth on days 1-4. Repeat following month.,  Disp: , Rfl:    diazepam (VALIUM) 5 MG tablet, Take 1 tablet (5 mg total) by mouth See admin instructions. TAKE (1) TABLET BY MOUTH EVERY SIX HOURS AS NEEDED FOR MUSCLE SPASMS., Disp: 10 tablet, Rfl: 0   feeding supplement (ENSURE ENLIVE / ENSURE PLUS) LIQD, Take 237 mLs by mouth 3 (three) times daily between meals., Disp: 237 mL, Rfl: 0   gabapentin (NEURONTIN) 300 MG capsule, Take 2 capsules (600 mg total) by mouth 3 (three) times daily., Disp: 180 capsule, Rfl: 1   HYDROcodone-acetaminophen (NORCO/VICODIN) 5-325 MG tablet, Take 1 tablet by mouth every 6 (six) hours as needed for moderate pain., Disp: 10 tablet, Rfl: 0   metoprolol succinate (TOPROL-XL) 50 MG 24 hr tablet, Take 50 mg by mouth at bedtime. Take 1 tablet by mouth at bedtime for tremor, Disp: , Rfl:    modafinil (PROVIGIL) 200 MG tablet, Take 1 tablet (200 mg total) by mouth in the morning., Disp: 30 tablet, Rfl: 5   sodium hypochlorite (DAKIN'S 1/4 STRENGTH) 0.125 % SOLN, Irrigate with as directed 2 (two) times daily at 10 AM and 5 PM., Disp: 473 mL, Rfl: 0   zinc sulfate 220 (50 Zn) MG capsule, Take 1 capsule (220 mg total) by mouth daily., Disp: , Rfl:   PAST MEDICAL HISTORY: Past Medical History:  Diagnosis Date   Anemia    my whole life, Used to take iron   Arthritis     spine, lumbar   COPD (chronic obstructive pulmonary disease) (HCC)    Emphysema of lung (HCC)    GERD (gastroesophageal reflux disease) 04/12/2021   HTN (hypertension) 03/01/2020   Hyperlipidemia 11/28/2019   Multiple sclerosis (HCC)    Narcolepsy    by history, has nightmares   Neuromuscular disorder (HCC)    leg issues from spine    PAST SURGICAL HISTORY: Past Surgical History:  Procedure Laterality Date   ANTERIOR CERVICAL DECOMP/DISCECTOMY FUSION N/A 01/19/2019   Procedure: ANTERIOR CERVICAL DECOMPRESSION/DISCECTOMY Cervical three - four;  Surgeon: Coletta Memos, MD;  Location: [facility] OR;  Service: Neurosurgery;  Laterality: N/A;   EYE SURGERY Right    ORIF FACIAL FRACTURE     POSTERIOR CERVICAL LAMINECTOMY N/A 01/19/2019   Procedure: POSTERIOR CERVICAL LAMINECTOMY Removal of Synovial Cyst and posterior cervical fusion;  Surgeon: Coletta Memos, MD;  Location: [facility] OR;  Service: Neurosurgery;  Laterality: N/A;   TEE WITHOUT CARDIOVERSION N/A 03/17/2020   Procedure: TRANSESOPHAGEAL ECHOCARDIOGRAM (TEE) WITH PROPOFOL;  Surgeon: Jonelle Sidle, MD;  Location: [facility];  Service: Cardiovascular;  Laterality: N/A;   TUBAL LIGATION     tubaligation      FAMILY HISTORY: Family History  Problem Relation Age of Onset   Heart attack Mother    Kidney failure Mother    Diabetes Mother    Heart disease Mother    Hyperlipidemia Mother    Hypertension Mother    Heart attack Father 81   Hypertension Father    Hypertension Sister    Diabetes Sister    Other Brother        house fire   Hypertension Brother    Seizures Brother    Heart disease Brother        mitral valve   Arthritis Daughter        DJD, AS fibromyalgia   Polymyositis Daughter    Cancer Maternal Grandfather        lung   Heart disease Maternal Grandfather    Other Paternal Grandmother  house fire   Alcohol abuse Son     SOCIAL HISTORY:  Social History   Socioeconomic History   Marital status:  Single    Spouse name: Not on file   Number of children: 3   Years of education: 12   Highest education level: Not on file  Occupational History   Occupation: disabled    Comment: house painting  Tobacco Use   Smoking status: Every Day    Current packs/day: 0.25    Average packs/day: 0.3 packs/day for 45.0 years (11.3 ttl pk-yrs)    Types: Cigarettes   Smokeless tobacco: Never   Tobacco comments:    cutting down  Vaping Use   Vaping status: Never Used  Substance and Sexual Activity   Alcohol use: Not Currently    Alcohol/week: 1.0 standard drink of alcohol    Types: 1 Cans of beer per week   Drug use: No   Sexual activity: Not Currently    Birth control/protection: Post-menopausal  Other Topics Concern   Not on file  Social History Narrative   Patient lives with her son. Mauro Kaufmann, age 7, frequently visits   Social Determinants of Health   Financial Resource Strain: Not on file  Food Insecurity: Not on file  Transportation Needs: Not on file  Physical Activity: Not on file  Stress: Not on file  Social Connections: Not on file  Intimate Partner Violence: Not on file     PHYSICAL EXAM  Vitals:   10/06/22 1422  BP: 123/79  Pulse: 91     There is no height or weight on file to calculate BMI.   General: The patient is well-developed and well-nourished and in no acute distress  HEENT:  Head is [place]/AT.  Sclera are anicteric.        Neurologic Exam  Mental status: The patient is alert and oriented x 3 at the time of the examination. The patient has apparent normal recent and remote memory, with an apparently normal attention span and concentration ability.   Speech is normal.  Cranial nerves: Extraocular movements are full. Pupils are equal, round, and reactive to light and accomodation.  Visual fields are full.  Facial symmetry is present. There is good facial sensation to soft touch bilaterally.Facial strength is normal.  Trapezius and sternocleidomastoid  strength is normal. No dysarthria is noted.  Hearing was symmetric.  Motor:  Muscle bulk is normal.   Tone is normal. Strength is  4+ to 5 / 5 in arms (better on right) and 4-/5 both iliopsoas and feet and 4/5 in quads.  Sensory: Sensory testing shows fairly symmetric touch and vibration today.    Coordination: Cerebellar testing reveals good left and mildly reduced right finger-nose-finger and poor heel-to-shin in the setting of leg weakness.  Gait and station: She cannot stand   Reflexes: Deep tendon reflexes are symmetric and normal in arms but increased in legs (crossed adductors at knees and clonus at ankles, multiple beats).      DIAGNOSTIC DATA (LABS, IMAGING, TESTING) - I reviewed patient records, labs, notes, testing and imaging myself where available.  Lab Results  Component Value Date   WBC 8.1 05/26/2022   HGB 12.8 05/26/2022   HCT 38.7 05/26/2022   MCV 92 05/26/2022   PLT 299 05/26/2022      Component Value Date/Time   NA 140 04/09/2022 1241   K 4.1 04/09/2022 1241   CL 102 04/09/2022 1241   CO2 29 04/09/2022 1241   GLUCOSE 87  04/09/2022 1241   BUN 14 04/09/2022 1241   CREATININE 0.66 04/09/2022 1241   CREATININE 0.53 11/28/2019 1339   CALCIUM 8.9 04/09/2022 1241   PROT 6.6 05/26/2022 1444   ALBUMIN 4.0 05/26/2022 1444   AST 13 05/26/2022 1444   ALT 8 05/26/2022 1444   ALKPHOS 89 05/26/2022 1444   BILITOT <0.2 05/26/2022 1444   GFRNONAA >60 04/09/2022 1241   GFRNONAA 101 11/28/2019 1339   GFRAA 117 11/28/2019 1339   Lab Results  Component Value Date   CHOL 199 11/28/2019   HDL 87 11/28/2019   LDLCALC 97 11/28/2019   TRIG 65 11/28/2019   CHOLHDL 2.3 11/28/2019   No results found for: "HGBA1C" Lab Results  Component Value Date   VITAMINB12 448 03/01/2020   Lab Results  Component Value Date   TSH 1.366 09/20/2021       ASSESSMENT AND PLAN  Multiple sclerosis (HCC) - Plan: CBC with Differential/Platelet, Comprehensive metabolic  panel  High risk medication use - Plan: CBC with Differential/Platelet, Comprehensive metabolic panel  Diplegia of both lower extremities (HCC)  Cervical myelopathy (HCC)  Dysesthesia  Other fatigue  She completed the second  year of Mavenclad and had GI issues and more fstigeu.  Check labs.     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 IV Solumedrol 500 mg x 1.   Modafinil for MS related fatigue She will return to see me in 4 months  She should call sooner if new or worsening neurologic symptoms.   This visit is part of a comprehensive longitudinal care medical relationship regarding the patients primary diagnosis of MS and related concerns.    A. Epimenio Foot, MD, [facility] 10/06/2022, 2:46 PM Certified in Neurology, Clinical Neurophysiology, Sleep Medicine and Neuroimaging  [facility] [address], [address] [place], [place] [address] [phone]

## 2022-10-15 ENCOUNTER — Ambulatory Visit (INDEPENDENT_AMBULATORY_CARE_PROVIDER_SITE_OTHER): Payer: Medicare (Managed Care) | Admitting: Infectious Diseases

## 2022-10-15 ENCOUNTER — Encounter: Payer: Self-pay | Admitting: Infectious Diseases

## 2022-10-15 ENCOUNTER — Other Ambulatory Visit: Payer: Self-pay

## 2022-10-15 VITALS — BP 120/85 | HR 74 | Resp 16 | Ht 63.0 in | Wt 94.2 lb

## 2022-10-15 DIAGNOSIS — L89154 Pressure ulcer of sacral region, stage 4: Secondary | ICD-10-CM

## 2022-10-15 DIAGNOSIS — E46 Unspecified protein-calorie malnutrition: Secondary | ICD-10-CM

## 2022-10-15 DIAGNOSIS — F17200 Nicotine dependence, unspecified, uncomplicated: Secondary | ICD-10-CM | POA: Diagnosis not present

## 2022-10-15 NOTE — Progress Notes (Addendum)
Patient Active Problem List   Diagnosis Date Noted   Sacral osteomyelitis (HCC)    Iron deficiency anemia due to chronic blood loss 09/20/2021    Class: Chronic   Osteomyelitis (HCC) 09/19/2021   Sepsis (HCC) 09/19/2021   UTI (urinary tract infection) 09/19/2021   High risk medication use 04/28/2021   Gait disturbance 04/28/2021   Dysesthesia 04/28/2021   Diplegia of both lower extremities (HCC) 04/28/2021   GERD (gastroesophageal reflux disease) 04/12/2021   Exacerbation of multiple sclerosis (HCC) 04/11/2021   Leukocytosis 04/11/2021   COVID-19 virus infection 02/15/2021   Dehydration 02/15/2021   Failure to thrive in adult 02/15/2021   Hypokalemia 02/15/2021   Lactic acidosis 02/15/2021   Debility 02/15/2021   Pressure injury of skin 02/15/2021   Acute osteomyelitis (HCC) 12/10/2020   Protein-calorie malnutrition, moderate (HCC) 03/29/2020   Sacral decubitus ulcer, stage IV (HCC)    Acute appendicitis    Decubital ulcer 03/13/2020   SIRS (systemic inflammatory response syndrome) (HCC) 03/13/2020   Bilateral leg weakness 03/01/2020   COPD (chronic obstructive pulmonary disease) (HCC) 03/01/2020   Essential hypertension 03/01/2020   Pressure injury of right buttock, stage 2 (HCC) 02/08/2020   PVD (peripheral vascular disease) (HCC) 11/28/2019   Mixed hyperlipidemia 11/28/2019   Wheelchair dependence 09/24/2019   Cervical myelopathy (HCC) 01/22/2019   Spinal cord compression due to degenerative disorder of spinal column 01/20/2019   Synovial cyst    Spondylolisthesis, cervical region 01/19/2019   Chronic back pain 01/18/2019   Multiple sclerosis exacerbation (HCC) 01/17/2019   Erythrocytosis 09/13/2018   Vitamin D deficiency 03/09/2016   Multiple sclerosis (HCC) 02/24/2016   Tobacco use 12/11/2015   Aortic atherosclerosis (HCC) 12/11/2015   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 Take 2 tablets by mouth BID for chronic pain   ASCORBIC ACID (VITAMIN C) 500 MG TABLET    Take 1 tablet (500 mg total) by mouth daily.   ATORVASTATIN (LIPITOR) 20 MG TABLET    Take 20 mg by mouth daily.   BACLOFEN (LIORESAL) 10 MG TABLET    Take 1 tablet (10 mg total) by mouth 3 (three) times daily.   CELECOXIB (CELEBREX) 200 MG CAPSULE    Take 1 capsule (200 mg total) by mouth 2 (two) times daily as needed.   CHLORHEXIDINE (PERIDEX) 0.12 % SOLUTION    Use as directed 15 mLs in the mouth or throat 4 (four) times daily.   CHOLECALCIFEROL (VITAMIN D3) 50 MCG (2000 UT) CAPSULE    Take 2,000 Units by mouth daily.   CLADRIBINE, 4 TABS, (MAVENCLAD, 4 TABS,) 10 MG TBPK    Take 10 mg by mouth daily. Take 10mg  by mouth on days 1-4. Repeat following month.   DIAZEPAM (VALIUM) 5 MG TABLET    Take 1 tablet (5 mg total) by mouth See admin instructions. TAKE (1) TABLET BY MOUTH EVERY SIX HOURS AS NEEDED FOR MUSCLE SPASMS.   FEEDING SUPPLEMENT (ENSURE ENLIVE / ENSURE PLUS) LIQD    Take 237 mLs by mouth 3 (three) times daily between meals.   GABAPENTIN (NEURONTIN) 300 MG CAPSULE    Take 2 capsules (600  mg total) by mouth 3 (three) times daily.   HYDROCODONE-ACETAMINOPHEN (NORCO/VICODIN) 5-325 MG TABLET    Take 1 tablet by mouth every 6 (six) hours as needed for moderate pain.   METOPROLOL SUCCINATE (TOPROL-XL) 50 MG 24 HR TABLET    Take 50 mg by mouth at bedtime. Take 1 tablet by mouth at bedtime for tremor   MODAFINIL (PROVIGIL) 200 MG TABLET    Take 1 tablet (200 mg total) by mouth in the morning.   SODIUM HYPOCHLORITE (DAKIN'S 1/4 STRENGTH) 0.125 % SOLN    Irrigate with as directed 2 (two) times daily at 10 AM and 5 PM.   ZINC SULFATE 220 (50 ZN) MG CAPSULE    Take 1  capsule (220 mg total) by mouth daily.  Modified Medications   No medications on file  Discontinued Medications   No medications on file    Subjective: 67 Y O female with PMH as below including MS and wheel chair dependence, MRSA bacteremia in 03/2020  who is referred from wound care center for evaluation of sacral wound.   She was treated with 6 weeks of PO doxycycline and po augmentin in July 2023 and was seen in 11/2021 by Dr Daiva Eves at the office. She was last seen by wound care in 09/16/2022. Per wound care notes, no changes to the sacral wound as well as no signs of infection. Reports she mostly sits in her recliner and tries to offload the wound as much as possible.  He dressing change is done by her daughter or daughter in law and no reported signs of infection like purulent drainage, swelling, redness etc. Denies fevers, chills. Feels nauseus due to medication for MS but denies vomiting and diarrhea. She denies taking any antibiotics currently. She reports using low-air-loss mattress at home. Next fu with wound care is Dec 20. She is being followd by Neurology for MS and takes Modafinil.   Review of Systems: all systems reviewed with pertinent positives and negatives as listed above  Past Medical History:  Diagnosis Date   Anemia    my whole life, Used to take iron   Arthritis    spine, lumbar   COPD (chronic obstructive pulmonary disease) (HCC)    Emphysema of lung (HCC)    GERD (gastroesophageal reflux disease) 04/12/2021   HTN (hypertension) 03/01/2020   Hyperlipidemia 11/28/2019   Multiple sclerosis (HCC)    Narcolepsy    by history, has nightmares   Neuromuscular disorder (HCC)    leg issues from spine   Past Surgical History:  Procedure Laterality Date   ANTERIOR CERVICAL DECOMP/DISCECTOMY FUSION N/A 01/19/2019   Procedure: ANTERIOR CERVICAL DECOMPRESSION/DISCECTOMY Cervical three - four;  Surgeon: Coletta Memos, MD;  Location: [facility] OR;  Service: Neurosurgery;   Laterality: N/A;   EYE SURGERY Right    ORIF FACIAL FRACTURE     POSTERIOR CERVICAL LAMINECTOMY N/A 01/19/2019   Procedure: POSTERIOR CERVICAL LAMINECTOMY Removal of Synovial Cyst and posterior cervical fusion;  Surgeon: Coletta Memos, MD;  Location: [facility] OR;  Service: Neurosurgery;  Laterality: N/A;   TEE WITHOUT CARDIOVERSION N/A 03/17/2020   Procedure: TRANSESOPHAGEAL ECHOCARDIOGRAM (TEE) WITH PROPOFOL;  Surgeon: Jonelle Sidle, MD;  Location: [facility];  Service: Cardiovascular;  Laterality: N/A;   TUBAL LIGATION     tubaligation      Social History   Tobacco Use   Smoking status: Every Day    Current packs/day: 0.25    Average packs/day: 0.3 packs/day for 45.0 years (11.3 ttl  pk-yrs)    Types: Cigarettes   Smokeless tobacco: Never   Tobacco comments:    cutting down  Vaping Use   Vaping status: Never Used  Substance Use Topics   Alcohol use: Not Currently    Alcohol/week: 1.0 standard drink of alcohol    Types: 1 Cans of beer per week   Drug use: No    Family History  Problem Relation Age of Onset   Heart attack Mother    Kidney failure Mother    Diabetes Mother    Heart disease Mother    Hyperlipidemia Mother    Hypertension Mother    Heart attack Father 85   Hypertension Father    Hypertension Sister    Diabetes Sister    Other Brother        house fire   Hypertension Brother    Seizures Brother    Heart disease Brother        mitral valve   Arthritis Daughter        DJD, AS fibromyalgia   Polymyositis Daughter    Cancer Maternal Grandfather        lung   Heart disease Maternal Grandfather    Other Paternal Grandmother        house fire   Alcohol abuse Son     Allergies  Allergen Reactions   Darvon [Propoxyphene] Other (See Comments)    Hallicuations   Penicillins Rash    Did it involve swelling of the face/tongue/throat, SOB, or low BP? Unknown Did it involve sudden or severe rash/hives, skin peeling, or any reaction on the inside of your  mouth or nose? Unknown Did you need to seek medical attention at a hospital or doctor's office? Unknown When did it last happen?      Unknown If all above answers are "NO", may proceed with cephalosporin use.     Health Maintenance  Topic Date Due   Medicare Annual Wellness (AWV)  Never done   DTaP/Tdap/Td (1 - Tdap) Never done   Zoster Vaccines- Shingrix (1 of 2) Never done   Colonoscopy  Never done   Pneumonia Vaccine 54+ Years old (2 of 2 - PPSV23 or PCV20) 02/05/2016   COVID-19 Vaccine (3 - Moderna risk series) 07/27/2019   INFLUENZA VACCINE  09/30/2022   MAMMOGRAM  03/28/2023   DEXA SCAN  Completed   Hepatitis C Screening  Completed   HPV VACCINES  Aged Out    Objective:  BP 120/85   Pulse 74   Resp 16   Ht 5\' 3"  (1.6 m)   Wt 94 lb 3.2 oz (42.7 kg)   BMI 16.69 kg/m   Physical Exam Constitutional:      Appearance: elderly malnourished lady  HENT:     Head: Normocephalic and atraumatic.      Mouth: Mucous membranes are moist.  Eyes:    Conjunctiva/sclera: Conjunctivae normal.     Pupils:   Cardiovascular:     Rate and Rhythm: Normal rate and regular rhythm.     Heart sounds: s1s2  Pulmonary:     Effort: Pulmonary effort is normal.     Breath sounds: Normal breath sounds.   Abdominal:     General: Non distended     Palpations: soft.   Musculoskeletal:        General: sitting in a power wheel chair   Skin:    General: Skin is warm and dry.     Comments: Unable to examine sacral wound as patient  is non weight bearing and no hoyer available in the clinic for positioning   Neurological:     General:    Mental Status: awake, alert and oriented to person, place, and time.   Psychiatric:        Mood and Affect: Mood normal.   Lab Results Lab Results  Component Value Date   WBC 7.7 10/06/2022   HGB 13.7 10/06/2022   HCT 41.7 10/06/2022   MCV 91 10/06/2022   PLT 238 10/06/2022    Lab Results  Component Value Date   CREATININE 0.63 10/06/2022    BUN 15 10/06/2022   NA 140 10/06/2022   K 4.5 10/06/2022   CL 98 10/06/2022   CO2 26 10/06/2022    Lab Results  Component Value Date   ALT 14 10/06/2022   AST 22 10/06/2022   ALKPHOS 94 10/06/2022   BILITOT 0.2 10/06/2022    Lab Results  Component Value Date   CHOL 199 11/28/2019   HDL 87 11/28/2019   LDLCALC 97 11/28/2019   TRIG 65 11/28/2019   CHOLHDL 2.3 11/28/2019   No results found for: "LABRPR", "RPRTITER" No results found for: "HIV1RNAQUANT", "HIV1RNAVL", "CD4TABS"   Assessment/Plan # Chronic stage 4 Sacral ulcer/osteomyelitis  Does not appear to have signs of infection at this time on talking to patient and last wound care notes. I am unable to examine the wound today due to unavailable support with her non weight bearing status  Recommend wound care, adequate nutrition and off loading of wound  No need for abtx at this time  Fu with wound care  Fu with [place] as needed   # Smoker  Will benefit from cutting down or cessation  # Multiple Sclerosis - Follow Neurology, on modafinil  I have personally spent 60 minutes involved in face-to-face and non-face-to-face activities for this patient on the day of the visit. Professional time spent includes the following activities: Preparing to see the patient (review of tests), Obtaining and/or reviewing separately obtained history (admission/discharge record), Performing a medically appropriate examination and/or evaluation , Ordering medications/tests/procedures, referring and communicating with other health care professionals, Documenting clinical information in the EMR, Independently interpreting results (not separately reported), Communicating results to the patient/family/caregiver, Counseling and educating the patient/family/caregiver and Care coordination (not separately reported).   Victoriano Lain, MD [facility] 10/15/2022, 10:47 AM

## 2022-12-09 ENCOUNTER — Emergency Department (HOSPITAL_COMMUNITY): Payer: Medicare (Managed Care)

## 2022-12-09 ENCOUNTER — Emergency Department (HOSPITAL_COMMUNITY)
Admission: EM | Admit: 2022-12-09 | Discharge: 2022-12-09 | Disposition: A | Discharge status: Home / Self Care | Payer: Medicare (Managed Care) | Attending: Emergency Medicine | Admitting: Emergency Medicine

## 2022-12-09 ENCOUNTER — Encounter (HOSPITAL_COMMUNITY): Payer: Self-pay

## 2022-12-09 ENCOUNTER — Other Ambulatory Visit: Payer: Self-pay

## 2022-12-09 DIAGNOSIS — I959 Hypotension, unspecified: Secondary | ICD-10-CM | POA: Insufficient documentation

## 2022-12-09 DIAGNOSIS — R0602 Shortness of breath: Secondary | ICD-10-CM | POA: Diagnosis not present

## 2022-12-09 DIAGNOSIS — R531 Weakness: Secondary | ICD-10-CM | POA: Diagnosis present

## 2022-12-09 DIAGNOSIS — E860 Dehydration: Secondary | ICD-10-CM | POA: Insufficient documentation

## 2022-12-09 LAB — COMPREHENSIVE METABOLIC PANEL
ALT: 13 U/L (ref 0–44)
AST: 18 U/L (ref 15–41)
Albumin: 3.3 g/dL — ABNORMAL LOW (ref 3.5–5.0)
Alkaline Phosphatase: 60 U/L (ref 38–126)
Anion gap: 9 (ref 5–15)
BUN: 10 mg/dL (ref 8–23)
CO2: 24 mmol/L (ref 22–32)
Calcium: 8.4 mg/dL — ABNORMAL LOW (ref 8.9–10.3)
Chloride: 104 mmol/L (ref 98–111)
Creatinine, Ser: 0.53 mg/dL (ref 0.44–1.00)
GFR, Estimated: >60 mL/min (ref >60)
Glucose, Bld: 125 mg/dL — ABNORMAL HIGH (ref 70–99)
Potassium: 3.5 mmol/L (ref 3.5–5.1)
Sodium: 137 mmol/L (ref 135–145)
Total Bilirubin: 0.4 mg/dL (ref 0.3–1.2)
Total Protein: 6.2 g/dL — ABNORMAL LOW (ref 6.5–8.1)

## 2022-12-09 LAB — CBC WITH DIFFERENTIAL/PLATELET
Abs Immature Granulocytes: 0 10*3/uL (ref 0.00–0.07)
Basophils Absolute: 0 10*3/uL (ref 0.0–0.1)
Basophils Relative: 0 %
Eosinophils Absolute: 0.3 10*3/uL (ref 0.0–0.5)
Eosinophils Relative: 4 %
HCT: 40 % (ref 36.0–46.0)
Hemoglobin: 13.2 g/dL (ref 12.0–15.0)
Lymphocytes Relative: 10 %
Lymphs Abs: 0.8 10*3/uL (ref 0.7–4.0)
MCH: 30.8 pg (ref 26.0–34.0)
MCHC: 33 g/dL (ref 30.0–36.0)
MCV: 93.2 fL (ref 80.0–100.0)
Monocytes Absolute: 0.5 10*3/uL (ref 0.1–1.0)
Monocytes Relative: 7 %
Neutro Abs: 5.9 10*3/uL (ref 1.7–7.7)
Neutrophils Relative %: 79 %
Platelets: 177 10*3/uL (ref 150–400)
RBC: 4.29 MIL/uL (ref 3.87–5.11)
RDW: 14.4 % (ref 11.5–15.5)
WBC: 7.5 10*3/uL (ref 4.0–10.5)
nRBC: 0 % (ref 0.0–0.2)

## 2022-12-09 LAB — URINALYSIS, W/ REFLEX TO CULTURE (INFECTION SUSPECTED)
Bilirubin Urine: NEGATIVE
Glucose, UA: NEGATIVE mg/dL
Hgb urine dipstick: NEGATIVE
Ketones, ur: 5 mg/dL — AB
Leukocytes,Ua: NEGATIVE
Nitrite: NEGATIVE
Protein, ur: NEGATIVE mg/dL
Specific Gravity, Urine: 1.011 (ref 1.005–1.030)
pH: 5 (ref 5.0–8.0)

## 2022-12-09 LAB — PROTIME-INR
INR: 1 (ref 0.8–1.2)
Prothrombin Time: 13.1 s (ref 11.4–15.2)

## 2022-12-09 LAB — LACTIC ACID, PLASMA
Lactic Acid, Venous: 0.9 mmol/L (ref 0.5–1.9)
Lactic Acid, Venous: 1.2 mmol/L (ref 0.5–1.9)

## 2022-12-09 LAB — APTT: aPTT: 28 s (ref 24–36)

## 2022-12-09 NOTE — ED Notes (Signed)
Pt aware of dc and states son+gf will be there. Secretary called for ems to take home. IV's removed. Monitor remains in place.

## 2022-12-09 NOTE — ED Provider Notes (Signed)
[facility] EMERGENCY DEPARTMENT AT [facility] Provider Note   CSN: [id number] Arrival date & time: 12/09/22  0014     History  Chief Complaint  Patient presents with   Hypotension   Weakness    Faith [name] is a 67 y.o. female.  Patient presents to the emergency department for evaluation of generalized weakness.  She reports that she has not been eating or drinking very much.  She has no other specific complaints.  EMS report that the patient was hypotensive initially, has been given IV fluid bolus during transport.       Home Medications Prior to Admission medications   Medication Sig Start Date End Date Taking? Authorizing Provider  acetaminophen (TYLENOL) 650 MG CR tablet Take 1,300 mg by mouth in the morning and at bedtime. Take 2 tablets by mouth BID for chronic pain Patient not taking: Reported on 10/15/2022    [provider]  ascorbic acid (VITAMIN C) 500 MG tablet Take 1 tablet (500 mg total) by mouth daily. 02/18/21   Joseph Art, DO  atorvastatin (LIPITOR) 20 MG tablet Take 20 mg by mouth daily.    [provider]  baclofen (LIORESAL) 10 MG tablet Take 1 tablet (10 mg total) by mouth 3 (three) times daily. Patient not taking: Reported on 10/15/2022 02/17/21   Joseph Art, DO  celecoxib (CELEBREX) 200 MG capsule Take 1 capsule (200 mg total) by mouth 2 (two) times daily as needed. 04/15/21   Burnadette Pop, MD  chlorhexidine (PERIDEX) 0.12 % solution Use as directed 15 mLs in the mouth or throat 4 (four) times daily. Patient not taking: Reported on 10/15/2022    [provider]  Cholecalciferol (VITAMIN D3) 50 MCG (2000 UT) capsule Take 2,000 Units by mouth daily. 02/11/21   [provider]  Cladribine, 4 Tabs, (MAVENCLAD, 4 TABS,) 10 MG TBPK Take 10 mg by mouth daily. Take 10mg  by mouth on days 1-4. Repeat following month.    Sater, Pearletha Furl, MD  diazepam (VALIUM) 5 MG tablet Take 1 tablet (5 mg total) by mouth See  admin instructions. TAKE (1) TABLET BY MOUTH EVERY SIX HOURS AS NEEDED FOR MUSCLE SPASMS. 02/17/21   Joseph Art, DO  feeding supplement (ENSURE ENLIVE / ENSURE PLUS) LIQD Take 237 mLs by mouth 3 (three) times daily between meals. 12/12/20   Briant Cedar, MD  gabapentin (NEURONTIN) 300 MG capsule Take 2 capsules (600 mg total) by mouth 3 (three) times daily. 02/05/19   Angiulli, Mcarthur Rossetti, PA-C  HYDROcodone-acetaminophen (NORCO/VICODIN) 5-325 MG tablet Take 1 tablet by mouth every 6 (six) hours as needed for moderate pain. 02/17/21   Joseph Art, DO  metoprolol succinate (TOPROL-XL) 50 MG 24 hr tablet Take 50 mg by mouth at bedtime. Take 1 tablet by mouth at bedtime for tremor Patient not taking: Reported on 10/15/2022    [provider]  modafinil (PROVIGIL) 200 MG tablet Take 1 tablet (200 mg total) by mouth in the morning. Patient not taking: Reported on 10/15/2022 10/06/22   Asa Lente, MD  propranolol ER (INDERAL LA) 60 MG 24 hr capsule Take 60 mg by mouth daily.    [provider]  sodium hypochlorite (DAKIN'S 1/4 STRENGTH) 0.125 % SOLN Irrigate with as directed 2 (two) times daily at 10 AM and 5 PM. Patient not taking: Reported on 10/15/2022 09/22/21   Kendell Bane, MD  zinc sulfate 220 (50 Zn) MG capsule Take 1 capsule (220  mg total) by mouth daily. Patient not taking: Reported on 10/15/2022 02/18/21   Joseph Art, DO      Allergies    Darvon [propoxyphene] and Penicillins    Review of Systems   Review of Systems  Physical Exam Updated Vital Signs BP 107/72   Pulse 69   Temp (!) 95.2 F (35.1 C)   Resp 15   Ht 5\' 3"  (1.6 m)   Wt 42 kg   SpO2 95%   BMI 16.40 kg/m  Physical Exam Vitals and nursing note reviewed.  Constitutional:      General: She is not in acute distress.    Appearance: She is underweight. She is ill-appearing.  HENT:     Head: Normocephalic and atraumatic.     Mouth/Throat:     Mouth: Mucous membranes are moist.   Eyes:     General: Vision grossly intact. Gaze aligned appropriately.     Extraocular Movements: Extraocular movements intact.     Conjunctiva/sclera: Conjunctivae normal.  Cardiovascular:     Rate and Rhythm: Normal rate and regular rhythm.     Pulses: Normal pulses.     Heart sounds: Normal heart sounds, S1 normal and S2 normal. No murmur heard.    No friction rub. No gallop.  Pulmonary:     Effort: Pulmonary effort is normal. No respiratory distress.     Breath sounds: Normal breath sounds.  Abdominal:     General: Bowel sounds are normal.     Palpations: Abdomen is soft.     Tenderness: There is no abdominal tenderness. There is no guarding or rebound.     Hernia: No hernia is present.  Musculoskeletal:        General: No swelling.     Cervical back: Full passive range of motion without pain, normal range of motion and neck supple. No spinous process tenderness or muscular tenderness. Normal range of motion.     Right lower leg: No edema.     Left lower leg: No edema.  Skin:    General: Skin is warm and dry.     Capillary Refill: Capillary refill takes less than 2 seconds.     Findings: Wound (Mole drainage, no induration, fluctuance) present. No ecchymosis, erythema or rash.  Neurological:     General: No focal deficit present.     Mental Status: She is alert and oriented to person, place, and time.     GCS: GCS eye subscore is 4. GCS verbal subscore is 5. GCS motor subscore is 6.     Cranial Nerves: Cranial nerves 2-12 are intact.     Sensory: Sensation is intact.     Motor: Motor function is intact.     Coordination: Coordination is intact.  Psychiatric:        Attention and Perception: Attention normal.        Mood and Affect: Mood normal.        Speech: Speech normal.        Behavior: Behavior normal.     ED Results / Procedures / Treatments   Labs (all labs ordered are listed, but only abnormal results are displayed) Labs Reviewed  COMPREHENSIVE METABOLIC  PANEL - Abnormal; Notable for the following components:      Result Value   Glucose, Bld 125 (*)    Calcium 8.4 (*)    Total Protein 6.2 (*)    Albumin 3.3 (*)    All other components within normal limits  URINALYSIS, W/ REFLEX  TO CULTURE (INFECTION SUSPECTED) - Abnormal; Notable for the following components:   APPearance HAZY (*)    Ketones, ur 5 (*)    Bacteria, UA RARE (*)    All other components within normal limits  CULTURE, BLOOD (ROUTINE X 2)  CULTURE, BLOOD (ROUTINE X 2)  LACTIC ACID, PLASMA  LACTIC ACID, PLASMA  CBC WITH DIFFERENTIAL/PLATELET  PROTIME-INR  APTT    EKG EKG Interpretation Date/Time:  Thursday December 09 2022 00:28:15 EDT Ventricular Rate:  71 PR Interval:  174 QRS Duration:  105 QT Interval:  448 QTC Calculation: 487 R Axis:   67  Text Interpretation: Sinus rhythm RSR' in V1 or V2, right VCD or RVH Borderline prolonged QT interval Confirmed by Gilda Crease [phone]) on 12/09/2022 12:41:26 AM  Radiology DG Chest Port 1 View  Result Date: 12/09/2022 CLINICAL DATA:  Weakness, dehydration, hypotension EXAM: PORTABLE CHEST 1 VIEW COMPARISON:  Radiographs 12/02/2021 FINDINGS: Stable cardiomediastinal silhouette. Aortic atherosclerotic calcification. Hyperinflation. No focal consolidation, pleural effusion, or pneumothorax. No displaced rib fractures. IMPRESSION: No acute cardiopulmonary process.  Emphysema. Electronically Signed   By: Minerva Fester M.D.   On: 12/09/2022 01:13    Procedures Procedures    Medications Ordered in ED Medications - No data to display  ED Course/ Medical Decision Making/ A&P                                 Medical Decision Making Amount and/or Complexity of Data Reviewed Labs: ordered. Decision-making details documented in ED Course. Radiology: ordered and independent interpretation performed. Decision-making details documented in ED Course. ECG/medicine tests: ordered and independent interpretation  performed. Decision-making details documented in ED Course.   Presents to the emergency department for evaluation of generalized weakness.  He appears chronically ill.  She is basically bedbound and in 7.  This has caused a sacral decubitus.  She is being cared [facility] and has daily wound checks and dressing changes.  This does not look significantly infected, appears to be chronic but well cared for.  Patient appeared dehydrated at arrival.  She was given IV fluids, blood pressure is now improved and she feels much stronger, close to baseline.  Her lab work is unremarkable.  No leukocytosis, normal lactic acid.  I do not see any signs of infection or evidence of sepsis.  Urinalysis without infection.  Patient back to her normal baseline, workup reassuring, will return home.        Final Clinical Impression(s) / ED Diagnoses Final diagnoses:  Dehydration    Rx / DC Orders ED Discharge Orders     None         Kellon Chalk, Canary Brim, MD 12/09/22 [phone]

## 2022-12-09 NOTE — ED Triage Notes (Signed)
Pt presents with weakness, dehydration and hypotension. Pt blood pressure 76/54 when EMS arrived. Pt states that she has not been eating or drinking. Son found her somewhat responsive.

## 2022-12-12 NOTE — Progress Notes (Deleted)
[facility]  PATIENT: Faith [name] DOB: Nov 11, 1955  REFERRING DOCTOR OR PCP: Marny Lowenstein, MD SOURCE: Patient, notes from PCP, notes from Dr. Gerilyn Pilgrim, note from recent hospital stay, imaging and lab reports, MRI images personally reviewed  _________________________________   HISTORICAL  CHIEF COMPLAINT:  No chief complaint on file.    HISTORY OF PRESENT ILLNESS:  Faith [name] is a 67 y.o. woman with multiple sclerosis.  Update 10/06/2022: She went to the emergency room 12/09/2022 due to weakness and dehydration.  She had lab work and received IV fluids.  Chest x-ray showed emphysema but no infection.  CBC showed white blood cell count of 7.5.  The lymphocyte count was 0.8.  CMP showed mildly reduced albumin and protein.  Lactic acid in the plasma was normal.  She was discharged home.  She completed the second year of Mavenclad in June 2024.  She had more GI issues and more fatigue.   She took [facility] March and April 2023 (4 pills each of the cycles).  She had lymphocytes 0.6 on 7/22.2023 and 0.8 (normal)12/02/2021 ,  0.5 04/09/2022 an back to normal 1.9 05/26/2022.  She does not feel any effect of the Mavenclad either way.    She has active secondary progressive MS and has slowly worsened strength over the past couple years   if she has only been treated for a few years, on Tysabri for about 2 years and receiving 1 dose of Ocrevus.     She spends most of her time in the wheelchair. Arms are strong though legs are very weak and she can transfer from chair to recliner or bedside commode.   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     She notes numbness in both legs.     She is on baclofen for spasticity, propranolol for tremor and gabapentin for dysesthesias.  She also uses weighted utensils for her tremor.   Bladder function is fine.  She has a sacral decubitus that is being followed by wound management .  It is better than last year and almost healed    Vision is poor (bilateral).    She feels it is stable  Cognition is doing well.      She notes more fatigue especially       She denies depression.   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      MS History: She was diagnosed with MS in 2007 after presenting with gradual worsening of gait.  She had noted progressive worsening of gait before that year but the change seemed more rapid at the time.  Two to three years earlier she had no major disability.    She saw Dr. Gerilyn Pilgrim in [place] who diagnosed er with MS.   She received IV steroids.   She was placed on Tysabri and felt she was stable x 2 years.   However, due to JCV it was stopped.   She was switched to Ocrevus but felt very poorly and feverish so did not do the second half of the dose.   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   She did not resume any medication after that one dose.    She feels she worsened after the surgery,  She was using a walker before then and needed a wheelchair afterwards.  She had her first course of Mavenclad March and April 2023.  She tolerated it well.  The second year of Mavenclad will be April  and May 2024   Imaging personally reviewed: MRI of the brain 02/29/2020 shows scattered T2/FLAIR hyperintense foci in the hemispheres consistent with MS.  None of the foci enhanced.  There was mild atrophy.  Only 1 infratentorial focus in the right posterior pons.  MRI of the cervical spine 02/29/2020 showed a T2 hyperintense focus posteriorly towards the right adjacent to C4.  She also has fusion with posterior fixation at C3-C4 and foraminal narrowing to the left involving C7, to the left at C3 and bilaterally at C6.  The effusion resolved the spinal stenosis with possible myelopathy noted on her previous MRI  MRI of the thoracic spine 03/01/2020 showed multiple T2 hyperintense foci within the spinal cord most defined at T7, T10 and T11..  There was no enhancement.  No significant degenerative change.  MRI of the  thoracic spine 04/12/2021 was unchanged compared to 03/01/2020.  MRI of the lumbar spine 04/12/2021 showed disc protrusion at L3-L4 that could affect the right L3 nerve root.  REVIEW OF SYSTEMS: Constitutional: No fevers, chills, sweats, or change in appetite Eyes: No visual changes, double vision, eye pain Ear, nose and throat: No hearing loss, ear pain, nasal congestion, sore throat Cardiovascular: No chest pain, palpitations Respiratory:  No shortness of breath at rest or with exertion.   No wheezes GastrointestinaI: No nausea, vomiting, diarrhea, abdominal pain, fecal incontinence Genitourinary:  No dysuria, urinary retention or frequency.  No nocturia. Musculoskeletal:  No neck pain, back pain Integumentary: No rash, pruritus, skin lesions Neurological: as above Psychiatric: No depression at this time.  No anxiety Endocrine: No palpitations, diaphoresis, change in appetite, change in weigh or increased thirst Hematologic/Lymphatic: She has had anemia.. Allergic/Immunologic: No itchy/runny eyes, nasal congestion, recent allergic reactions, rashes  ALLERGIES: Allergies  Allergen Reactions   Darvon [Propoxyphene] Other (See Comments)    Hallicuations   Penicillins Rash    Did it involve swelling of the face/tongue/throat, SOB, or low BP? Unknown Did it involve sudden or severe rash/hives, skin peeling, or any reaction on the inside of your mouth or nose? Unknown Did you need to seek medical attention at a hospital or doctor's office? Unknown When did it last happen?      Unknown If all above answers are "NO", may proceed with cephalosporin use.     HOME MEDICATIONS:  Current Outpatient Medications:    acetaminophen (TYLENOL) 650 MG CR tablet, Take 1,300 mg by mouth in the morning and at bedtime. Take 2 tablets by mouth BID for chronic pain (Patient not taking: Reported on 10/15/2022), Disp: , Rfl:    ascorbic acid (VITAMIN C) 500 MG tablet, Take 1 tablet (500 mg total) by mouth  daily., Disp: , Rfl:    atorvastatin (LIPITOR) 20 MG tablet, Take 20 mg by mouth daily., Disp: , Rfl:    baclofen (LIORESAL) 10 MG tablet, Take 1 tablet (10 mg total) by mouth 3 (three) times daily. (Patient not taking: Reported on 10/15/2022), Disp: 10 each, Rfl: 0   celecoxib (CELEBREX) 200 MG capsule, Take 1 capsule (200 mg total) by mouth 2 (two) times daily as needed., Disp: 60 capsule, Rfl: 2   chlorhexidine (PERIDEX) 0.12 % solution, Use as directed 15 mLs in the mouth or throat 4 (four) times daily. (Patient not taking: Reported on 10/15/2022), Disp: , Rfl:    Cholecalciferol (VITAMIN D3) 50 MCG (2000 UT) capsule, Take 2,000 Units by mouth daily., Disp: , Rfl:    Cladribine, 4 Tabs, (MAVENCLAD, 4 TABS,) 10 MG TBPK, Take  10 mg by mouth daily. Take 10mg  by mouth on days 1-4. Repeat following month., Disp: , Rfl:    diazepam (VALIUM) 5 MG tablet, Take 1 tablet (5 mg total) by mouth See admin instructions. TAKE (1) TABLET BY MOUTH EVERY SIX HOURS AS NEEDED FOR MUSCLE SPASMS., Disp: 10 tablet, Rfl: 0   feeding supplement (ENSURE ENLIVE / ENSURE PLUS) LIQD, Take 237 mLs by mouth 3 (three) times daily between meals., Disp: 237 mL, Rfl: 0   gabapentin (NEURONTIN) 300 MG capsule, Take 2 capsules (600 mg total) by mouth 3 (three) times daily., Disp: 180 capsule, Rfl: 1   HYDROcodone-acetaminophen (NORCO/VICODIN) 5-325 MG tablet, Take 1 tablet by mouth every 6 (six) hours as needed for moderate pain., Disp: 10 tablet, Rfl: 0   metoprolol succinate (TOPROL-XL) 50 MG 24 hr tablet, Take 50 mg by mouth at bedtime. Take 1 tablet by mouth at bedtime for tremor (Patient not taking: Reported on 10/15/2022), Disp: , Rfl:    modafinil (PROVIGIL) 200 MG tablet, Take 1 tablet (200 mg total) by mouth in the morning. (Patient not taking: Reported on 10/15/2022), Disp: 30 tablet, Rfl: 5   propranolol ER (INDERAL LA) 60 MG 24 hr capsule, Take 60 mg by mouth daily., Disp: , Rfl:    sodium hypochlorite (DAKIN'S 1/4 STRENGTH)  0.125 % SOLN, Irrigate with as directed 2 (two) times daily at 10 AM and 5 PM. (Patient not taking: Reported on 10/15/2022), Disp: 473 mL, Rfl: 0   zinc sulfate 220 (50 Zn) MG capsule, Take 1 capsule (220 mg total) by mouth daily. (Patient not taking: Reported on 10/15/2022), Disp: , Rfl:   PAST MEDICAL HISTORY: Past Medical History:  Diagnosis Date   Anemia    my whole life, Used to take iron   Arthritis    spine, lumbar   COPD (chronic obstructive pulmonary disease) (HCC)    Emphysema of lung (HCC)    GERD (gastroesophageal reflux disease) 04/12/2021   HTN (hypertension) 03/01/2020   Hyperlipidemia 11/28/2019   Multiple sclerosis (HCC)    Narcolepsy    by history, has nightmares   Neuromuscular disorder (HCC)    leg issues from spine    PAST SURGICAL HISTORY: Past Surgical History:  Procedure Laterality Date   ANTERIOR CERVICAL DECOMP/DISCECTOMY FUSION N/A 01/19/2019   Procedure: ANTERIOR CERVICAL DECOMPRESSION/DISCECTOMY Cervical three - four;  Surgeon: Coletta Memos, MD;  Location: [facility] OR;  Service: Neurosurgery;  Laterality: N/A;   EYE SURGERY Right    ORIF FACIAL FRACTURE     POSTERIOR CERVICAL LAMINECTOMY N/A 01/19/2019   Procedure: POSTERIOR CERVICAL LAMINECTOMY Removal of Synovial Cyst and posterior cervical fusion;  Surgeon: Coletta Memos, MD;  Location: [facility] OR;  Service: Neurosurgery;  Laterality: N/A;   TEE WITHOUT CARDIOVERSION N/A 03/17/2020   Procedure: TRANSESOPHAGEAL ECHOCARDIOGRAM (TEE) WITH PROPOFOL;  Surgeon: Jonelle Sidle, MD;  Location: [facility];  Service: Cardiovascular;  Laterality: N/A;   TUBAL LIGATION     tubaligation      FAMILY HISTORY: Family History  Problem Relation Age of Onset   Heart attack Mother    Kidney failure Mother    Diabetes Mother    Heart disease Mother    Hyperlipidemia Mother    Hypertension Mother    Heart attack Father 42   Hypertension Father    Hypertension Sister    Diabetes Sister    Other Brother         house fire   Hypertension Brother    Seizures Brother  Heart disease Brother        mitral valve   Arthritis Daughter        DJD, AS fibromyalgia   Polymyositis Daughter    Cancer Maternal Grandfather        lung   Heart disease Maternal Grandfather    Other Paternal Grandmother        house fire   Alcohol abuse Son     SOCIAL HISTORY:  Social History   Socioeconomic History   Marital status: Single    Spouse name: Not on file   Number of children: 3   Years of education: 12   Highest education level: Not on file  Occupational History   Occupation: disabled    Comment: house painting  Tobacco Use   Smoking status: Every Day    Current packs/day: 0.25    Average packs/day: 0.3 packs/day for 45.0 years (11.3 ttl pk-yrs)    Types: Cigarettes   Smokeless tobacco: Never   Tobacco comments:    cutting down  Vaping Use   Vaping status: Never Used  Substance and Sexual Activity   Alcohol use: Not Currently    Alcohol/week: 1.0 standard drink of alcohol    Types: 1 Cans of beer per week   Drug use: No   Sexual activity: Not Currently    Birth control/protection: Post-menopausal  Other Topics Concern   Not on file  Social History Narrative   Patient lives with her son. Mauro Kaufmann, age 74, frequently visits   Social Determinants of Health   Financial Resource Strain: Not on file  Food Insecurity: Not on file  Transportation Needs: Not on file  Physical Activity: Not on file  Stress: Not on file  Social Connections: Not on file  Intimate Partner Violence: Not on file     PHYSICAL EXAM  There were no vitals filed for this visit.    There is no height or weight on file to calculate BMI.   General: The patient is well-developed and well-nourished and in no acute distress  HEENT:  Head is [place]/AT.  Sclera are anicteric.        Neurologic Exam  Mental status: The patient is alert and oriented x 3 at the time of the examination. The patient has  apparent normal recent and remote memory, with an apparently normal attention span and concentration ability.   Speech is normal.  Cranial nerves: Extraocular movements are full. Pupils are equal, round, and reactive to light and accomodation.  Visual fields are full.  Facial symmetry is present. There is good facial sensation to soft touch bilaterally.Facial strength is normal.  Trapezius and sternocleidomastoid strength is normal. No dysarthria is noted.  Hearing was symmetric.  Motor:  Muscle bulk is normal.   Tone is normal. Strength is  4+ to 5 / 5 in arms (better on right) and 4-/5 both iliopsoas and feet and 4/5 in quads.  Sensory: Sensory testing shows fairly symmetric touch and vibration today.    Coordination: Cerebellar testing reveals good left and mildly reduced right finger-nose-finger and poor heel-to-shin in the setting of leg weakness.  Gait and station: She cannot stand   Reflexes: Deep tendon reflexes are symmetric and normal in arms but increased in legs (crossed adductors at knees and clonus at ankles, multiple beats).      DIAGNOSTIC DATA (LABS, IMAGING, TESTING) - I reviewed patient records, labs, notes, testing and imaging myself where available.  Lab Results  Component Value Date   WBC  7.5 12/09/2022   HGB 13.2 12/09/2022   HCT 40.0 12/09/2022   MCV 93.2 12/09/2022   PLT 177 12/09/2022      Component Value Date/Time   NA 137 12/09/2022 0023   NA 140 10/06/2022 1546   K 3.5 12/09/2022 0023   CL 104 12/09/2022 0023   CO2 24 12/09/2022 0023   GLUCOSE 125 (H) 12/09/2022 0023   BUN 10 12/09/2022 0023   BUN 15 10/06/2022 1546   CREATININE 0.53 12/09/2022 0023   CREATININE 0.53 11/28/2019 1339   CALCIUM 8.4 (L) 12/09/2022 0023   PROT 6.2 (L) 12/09/2022 0023   PROT 6.7 10/06/2022 1546   ALBUMIN 3.3 (L) 12/09/2022 0023   ALBUMIN 4.1 10/06/2022 1546   AST 18 12/09/2022 0023   ALT 13 12/09/2022 0023   ALKPHOS 60 12/09/2022 0023   BILITOT 0.4 12/09/2022  0023   BILITOT 0.2 10/06/2022 1546   GFRNONAA >60 12/09/2022 0023   GFRNONAA 101 11/28/2019 1339   GFRAA 117 11/28/2019 1339   Lab Results  Component Value Date   CHOL 199 11/28/2019   HDL 87 11/28/2019   LDLCALC 97 11/28/2019   TRIG 65 11/28/2019   CHOLHDL 2.3 11/28/2019   No results found for: "HGBA1C" Lab Results  Component Value Date   VITAMINB12 448 03/01/2020   Lab Results  Component Value Date   TSH 1.366 09/20/2021       ASSESSMENT AND PLAN  No diagnosis found.  She completed the second  year of Mavenclad and had GI issues and more fstigeu.  Check labs.     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 IV Solumedrol 500 mg x 1.   Modafinil for MS related fatigue She will return to see me in 4 months  She should call sooner if new or worsening neurologic symptoms.   This visit is part of a comprehensive longitudinal care medical relationship regarding the patients primary diagnosis of MS and related concerns.   [place] Yepiz A. Epimenio Foot, MD, [facility] 12/12/2022, 8:06 PM Certified in Neurology, Clinical Neurophysiology, Sleep Medicine and Neuroimaging  [facility] [address] [place], [place] [address] [phone]

## 2022-12-13 ENCOUNTER — Encounter: Payer: Self-pay | Admitting: Neurology

## 2022-12-13 ENCOUNTER — Institutional Professional Consult (permissible substitution): Payer: Medicare (Managed Care) | Admitting: Neurology

## 2022-12-14 LAB — CULTURE, BLOOD (ROUTINE X 2)
Culture: NO GROWTH
Culture: NO GROWTH

## 2022-12-23 IMAGING — DX DG CHEST 1V PORT
1 series · 1 of 1 positions shown · non-contrast
Comparison: Portable chest 12/10/2020.

CLINICAL DATA: Weakness, shortness of breath and hypoxia.

EXAM:
PORTABLE CHEST 1 VIEW

[chest ap]
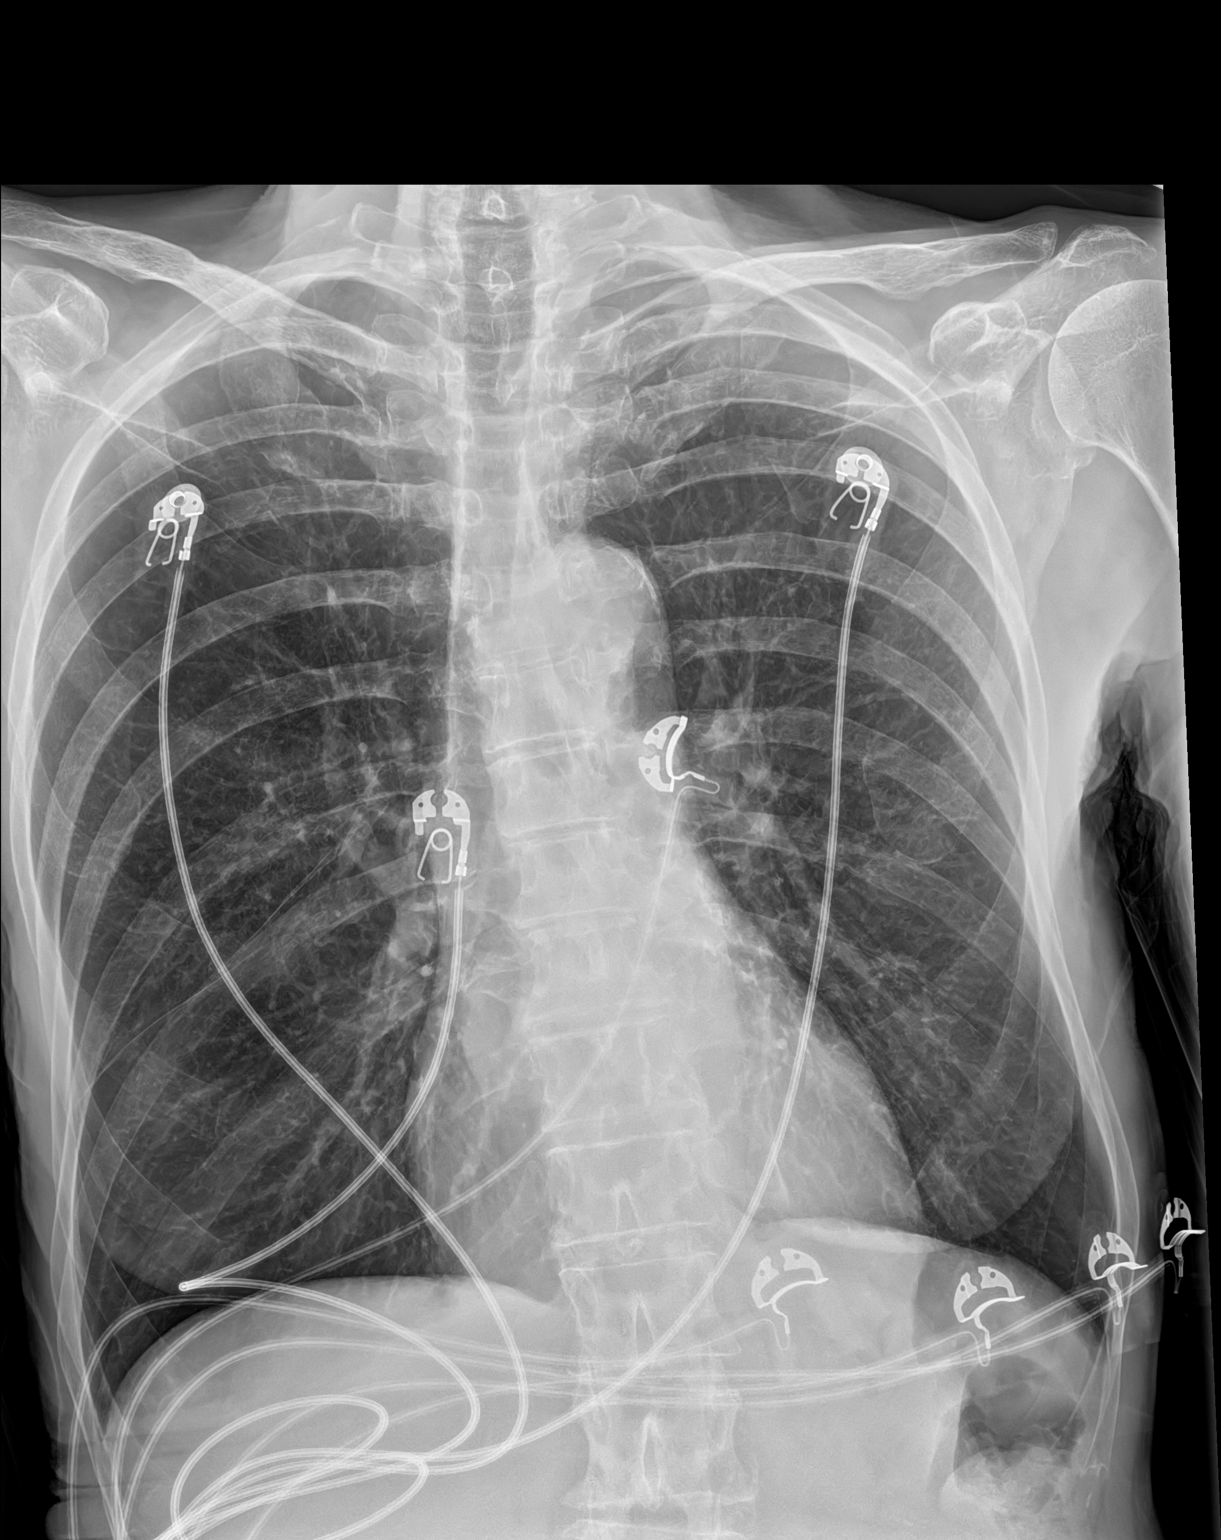

[1 of 1 positions shown; findings below may reference images not displayed]

FINDINGS: Heart size and central vasculature are normal. There is patchy
aortic calcification and slight tortuosity. The sulci are sharp. The
lungs are emphysematous but clear. Osteopenia and thoracic
spondylosis. Multiple overlying monitor wires.
IMPRESSION: No evidence of acute chest disease.  Stable COPD chest.

## 2023-02-15 ENCOUNTER — Ambulatory Visit (INDEPENDENT_AMBULATORY_CARE_PROVIDER_SITE_OTHER): Payer: Medicare (Managed Care) | Admitting: Neurology

## 2023-02-15 ENCOUNTER — Encounter: Payer: Self-pay | Admitting: Neurology

## 2023-02-15 VITALS — BP 98/70 | HR 80

## 2023-02-15 DIAGNOSIS — Z79899 Other long term (current) drug therapy: Secondary | ICD-10-CM

## 2023-02-15 DIAGNOSIS — G35D Multiple sclerosis, unspecified: Secondary | ICD-10-CM

## 2023-02-15 DIAGNOSIS — G8220 Paraplegia, unspecified: Secondary | ICD-10-CM | POA: Diagnosis not present

## 2023-02-15 DIAGNOSIS — R269 Unspecified abnormalities of gait and mobility: Secondary | ICD-10-CM

## 2023-02-15 DIAGNOSIS — R208 Other disturbances of skin sensation: Secondary | ICD-10-CM

## 2023-02-15 DIAGNOSIS — R5383 Other fatigue: Secondary | ICD-10-CM

## 2023-02-15 DIAGNOSIS — G35 Multiple sclerosis: Secondary | ICD-10-CM | POA: Diagnosis not present

## 2023-02-15 DIAGNOSIS — G959 Disease of spinal cord, unspecified: Secondary | ICD-10-CM

## 2023-02-15 MED ORDER — MODAFINIL 200 MG PO TABS: 200.0000 mg | ORAL_TABLET | Freq: Every morning | ORAL | 5 refills | Status: AC

## 2023-02-15 NOTE — Progress Notes (Signed)
[facility]  PATIENT: Faith [name] DOB: 1955-07-09  REFERRING DOCTOR OR PCP: Marny Lowenstein, MD SOURCE: Patient, notes from PCP, notes from Dr. Gerilyn Pilgrim, note from recent hospital stay, imaging and lab reports, MRI images personally reviewed  _________________________________   HISTORICAL  CHIEF COMPLAINT:  Chief Complaint  Patient presents with   Follow-up    Pt in room 10 alone. Here for MS follow up. Patient reports her standing/transferring is getting worse.  States she can still stand/transfer with help. States it hard to hold her head up .     HISTORY OF PRESENT ILLNESS:  Faith [name] is a 67 y.o. woman with multiple sclerosis.  Update 02/15/2023: She completed the second year of Mavenclad in June.  She had more GI issues and more fatigue.   She took [facility] March and April 2023 (4 pills each of the cycles) and again May/June 2024 for year 2.   She does not feel any effect of the Mavenclad either way.     She has active secondary progressive MS and has slowly worsened strength over the past couple years   She has more trouble transferring in/out of the wheelchair.  No falls while transferring  She spends most of her time in the wheelchair. Though strength is near normal in arms, she has reduced coordination and cannot write.  Legs are very weak and she can transfer from chair to recliner or bedside commode.   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     She notes numbness in both legs.     She is on baclofen for spasticity, propranolol for tremor but it does not help much.  She also uses weighted utensils for her tremor.  She is also gabapentin for dysesthesias with some benefit.      Bladder function is fine.  She has a sacral decubitus that is being followed by wound management .  It is not completely healed  A nurse comes to the house now and then but family dresses .    Vision is poor (bilateral).   She takes eye drops.   Eyes are dry.     Cognition is doing  well.      She notes more fatigue especially       She denies depression.   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      MS History: She was diagnosed with MS in 2007 after presenting with gradual worsening of gait.  She had noted progressive worsening of gait before that year but the change seemed more rapid at the time.  Two to three years earlier she had no major disability.    She saw Dr. Gerilyn Pilgrim in [place] who diagnosed er with MS.   She received IV steroids.   She was placed on Tysabri and felt she was stable x 2 years.   However, due to JCV it was stopped.   She was switched to Ocrevus but felt very poorly and feverish so did not do the second half of the dose.   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   She did not resume any medication after that one dose.    She feels she worsened after the surgery,  She was using a walker before then and needed a wheelchair afterwards.  She had her first course of Mavenclad March and April 2023.  She tolerated it well.  The second year of Mavenclad will be April and May 2024  Imaging personally reviewed: MRI of the brain 02/29/2020 shows scattered T2/FLAIR hyperintense foci in the hemispheres consistent with MS.  None of the foci enhanced.  There was mild atrophy.  Only 1 infratentorial focus in the right posterior pons.  MRI of the cervical spine 02/29/2020 showed a T2 hyperintense focus posteriorly towards the right adjacent to C4.  She also has fusion with posterior fixation at C3-C4 and foraminal narrowing to the left involving C7, to the left at C3 and bilaterally at C6.  The effusion resolved the spinal stenosis with possible myelopathy noted on her previous MRI  MRI of the thoracic spine 03/01/2020 showed multiple T2 hyperintense foci within the spinal cord most defined at T7, T10 and T11..  There was no enhancement.  No significant degenerative change.  MRI of the thoracic spine 04/12/2021 was unchanged compared  to 03/01/2020.  MRI of the lumbar spine 04/12/2021 showed disc protrusion at L3-L4 that could affect the right L3 nerve root.  REVIEW OF SYSTEMS: Constitutional: No fevers, chills, sweats, or change in appetite Eyes: No visual changes, double vision, eye pain Ear, nose and throat: No hearing loss, ear pain, nasal congestion, sore throat Cardiovascular: No chest pain, palpitations Respiratory:  No shortness of breath at rest or with exertion.   No wheezes GastrointestinaI: No nausea, vomiting, diarrhea, abdominal pain, fecal incontinence Genitourinary:  No dysuria, urinary retention or frequency.  No nocturia. Musculoskeletal:  No neck pain, back pain Integumentary: No rash, pruritus, skin lesions Neurological: as above Psychiatric: No depression at this time.  No anxiety Endocrine: No palpitations, diaphoresis, change in appetite, change in weigh or increased thirst Hematologic/Lymphatic: She has had anemia.. Allergic/Immunologic: No itchy/runny eyes, nasal congestion, recent allergic reactions, rashes  ALLERGIES: Allergies  Allergen Reactions   Darvon [Propoxyphene] Other (See Comments)    Hallicuations   Penicillins Rash    Did it involve swelling of the face/tongue/throat, SOB, or low BP? Unknown Did it involve sudden or severe rash/hives, skin peeling, or any reaction on the inside of your mouth or nose? Unknown Did you need to seek medical attention at a hospital or doctor's office? Unknown When did it last happen?      Unknown If all above answers are "NO", may proceed with cephalosporin use.     HOME MEDICATIONS:  Current Outpatient Medications:    ascorbic acid (VITAMIN C) 500 MG tablet, Take 1 tablet (500 mg total) by mouth daily., Disp: , Rfl:    atorvastatin (LIPITOR) 20 MG tablet, Take 20 mg by mouth daily., Disp: , Rfl:    Cholecalciferol (VITAMIN D3) 50 MCG (2000 UT) capsule, Take 2,000 Units by mouth daily., Disp: , Rfl:    diazepam (VALIUM) 5 MG tablet, Take 1  tablet (5 mg total) by mouth See admin instructions. TAKE (1) TABLET BY MOUTH EVERY SIX HOURS AS NEEDED FOR MUSCLE SPASMS., Disp: 10 tablet, Rfl: 0   feeding supplement (ENSURE ENLIVE / ENSURE PLUS) LIQD, Take 237 mLs by mouth 3 (three) times daily between meals., Disp: 237 mL, Rfl: 0   gabapentin (NEURONTIN) 300 MG capsule, Take 2 capsules (600 mg total) by mouth 3 (three) times daily., Disp: 180 capsule, Rfl: 1   HYDROcodone-acetaminophen (NORCO/VICODIN) 5-325 MG tablet, Take 1 tablet by mouth every 6 (six) hours as needed for moderate pain., Disp: 10 tablet, Rfl: 0   propranolol ER (INDERAL LA) 60 MG 24 hr capsule, Take 60 mg by mouth daily., Disp: , Rfl:    acetaminophen (TYLENOL) 650 MG CR tablet, Take  1,300 mg by mouth in the morning and at bedtime. Take 2 tablets by mouth BID for chronic pain (Patient not taking: Reported on 10/15/2022), Disp: , Rfl:    baclofen (LIORESAL) 10 MG tablet, Take 1 tablet (10 mg total) by mouth 3 (three) times daily. (Patient not taking: Reported on 10/15/2022), Disp: 10 each, Rfl: 0   celecoxib (CELEBREX) 200 MG capsule, Take 1 capsule (200 mg total) by mouth 2 (two) times daily as needed. (Patient not taking: Reported on 02/15/2023), Disp: 60 capsule, Rfl: 2   chlorhexidine (PERIDEX) 0.12 % solution, Use as directed 15 mLs in the mouth or throat 4 (four) times daily. (Patient not taking: Reported on 10/15/2022), Disp: , Rfl:    Cladribine, 4 Tabs, (MAVENCLAD, 4 TABS,) 10 MG TBPK, Take 10 mg by mouth daily. Take 10mg  by mouth on days 1-4. Repeat following month. (Patient not taking: Reported on 02/15/2023), Disp: , Rfl:    metoprolol succinate (TOPROL-XL) 50 MG 24 hr tablet, Take 50 mg by mouth at bedtime. Take 1 tablet by mouth at bedtime for tremor (Patient not taking: Reported on 10/15/2022), Disp: , Rfl:    modafinil (PROVIGIL) 200 MG tablet, Take 1 tablet (200 mg total) by mouth in the morning., Disp: 30 tablet, Rfl: 5   sodium hypochlorite (DAKIN'S 1/4 STRENGTH)  0.125 % SOLN, Irrigate with as directed 2 (two) times daily at 10 AM and 5 PM. (Patient not taking: Reported on 10/15/2022), Disp: 473 mL, Rfl: 0   zinc sulfate 220 (50 Zn) MG capsule, Take 1 capsule (220 mg total) by mouth daily. (Patient not taking: Reported on 10/15/2022), Disp: , Rfl:   PAST MEDICAL HISTORY: Past Medical History:  Diagnosis Date   Anemia    my whole life, Used to take iron   Arthritis    spine, lumbar   COPD (chronic obstructive pulmonary disease) (HCC)    Emphysema of lung (HCC)    GERD (gastroesophageal reflux disease) 04/12/2021   HTN (hypertension) 03/01/2020   Hyperlipidemia 11/28/2019   Multiple sclerosis (HCC)    Narcolepsy    by history, has nightmares   Neuromuscular disorder (HCC)    leg issues from spine    PAST SURGICAL HISTORY: Past Surgical History:  Procedure Laterality Date   ANTERIOR CERVICAL DECOMP/DISCECTOMY FUSION N/A 01/19/2019   Procedure: ANTERIOR CERVICAL DECOMPRESSION/DISCECTOMY Cervical three - four;  Surgeon: Coletta Memos, MD;  Location: [facility] OR;  Service: Neurosurgery;  Laterality: N/A;   EYE SURGERY Right    ORIF FACIAL FRACTURE     POSTERIOR CERVICAL LAMINECTOMY N/A 01/19/2019   Procedure: POSTERIOR CERVICAL LAMINECTOMY Removal of Synovial Cyst and posterior cervical fusion;  Surgeon: Coletta Memos, MD;  Location: [facility] OR;  Service: Neurosurgery;  Laterality: N/A;   TEE WITHOUT CARDIOVERSION N/A 03/17/2020   Procedure: TRANSESOPHAGEAL ECHOCARDIOGRAM (TEE) WITH PROPOFOL;  Surgeon: Jonelle Sidle, MD;  Location: [facility];  Service: Cardiovascular;  Laterality: N/A;   TUBAL LIGATION     tubaligation      FAMILY HISTORY: Family History  Problem Relation Age of Onset   Heart attack Mother    Kidney failure Mother    Diabetes Mother    Heart disease Mother    Hyperlipidemia Mother    Hypertension Mother    Heart attack Father 52   Hypertension Father    Hypertension Sister    Diabetes Sister    Other Brother         house fire   Hypertension Brother    Seizures  Brother    Heart disease Brother        mitral valve   Arthritis Daughter        DJD, AS fibromyalgia   Polymyositis Daughter    Cancer Maternal Grandfather        lung   Heart disease Maternal Grandfather    Other Paternal Grandmother        house fire   Alcohol abuse Son     SOCIAL HISTORY:  Social History   Socioeconomic History   Marital status: Single    Spouse name: Not on file   Number of children: 3   Years of education: 12   Highest education level: Not on file  Occupational History   Occupation: disabled    Comment: house painting  Tobacco Use   Smoking status: Every Day    Current packs/day: 0.25    Average packs/day: 0.3 packs/day for 45.0 years (11.3 ttl pk-yrs)    Types: Cigarettes   Smokeless tobacco: Never   Tobacco comments:    cutting down  Vaping Use   Vaping status: Never Used  Substance and Sexual Activity   Alcohol use: Not Currently    Alcohol/week: 1.0 standard drink of alcohol    Types: 1 Cans of beer per week   Drug use: No   Sexual activity: Not Currently    Birth control/protection: Post-menopausal  Other Topics Concern   Not on file  Social History Narrative   Patient lives with her son. Mauro Kaufmann, age 50, frequently visits   Social Drivers of Health   Financial Resource Strain: Not on file  Food Insecurity: Not on file  Transportation Needs: Not on file  Physical Activity: Not on file  Stress: Not on file  Social Connections: Not on file  Intimate Partner Violence: Not on file     PHYSICAL EXAM  Vitals:   02/15/23 1419  BP: 98/70  Pulse: 80     There is no height or weight on file to calculate BMI.   General: The patient is well-developed and well-nourished and in no acute distress  HEENT:  Head is [place]/AT.  Sclera are anicteric.        Neurologic Exam  Mental status: The patient is alert and oriented x 3 at the time of the examination. The patient has  apparent normal recent and remote memory, with an apparently normal attention span and concentration ability.   Speech is normal.  Cranial nerves: Extraocular movements are full. Pupils are equal, round, and reactive to light and accomodation.  Visual fields are full.  Facial symmetry is present. There is good facial sensation to soft touch bilaterally.Facial strength is normal.  Trapezius and sternocleidomastoid strength is normal. No dysarthria is noted.  Hearing was symmetric.  Motor:  Muscle bulk is normal.   Tone is normal. Strength is  4+/ 5 in arms, a little worse on right than left.   Legs are 4-/5 on right and 4/5 on left.   Reduced RAM  Sensory: Sensory testing shows fairly symmetric touch and vibration today.    Coordination: Cerebellar testing reveals reduced left and mildly reduced right finger-nose-finger   Gait and station: She cannot stand   Reflexes: Deep tendon reflexes are symmetric and normal in arms but increased in legs (crossed adductors at knees and clonus at ankles, multiple beats on right and 2 bears on left).      DIAGNOSTIC DATA (LABS, IMAGING, TESTING) - I reviewed patient records, labs, notes, testing and imaging  myself where available.  Lab Results  Component Value Date   WBC 7.5 12/09/2022   HGB 13.2 12/09/2022   HCT 40.0 12/09/2022   MCV 93.2 12/09/2022   PLT 177 12/09/2022      Component Value Date/Time   NA 137 12/09/2022 0023   NA 140 10/06/2022 1546   K 3.5 12/09/2022 0023   CL 104 12/09/2022 0023   CO2 24 12/09/2022 0023   GLUCOSE 125 (H) 12/09/2022 0023   BUN 10 12/09/2022 0023   BUN 15 10/06/2022 1546   CREATININE 0.53 12/09/2022 0023   CREATININE 0.53 11/28/2019 1339   CALCIUM 8.4 (L) 12/09/2022 0023   PROT 6.2 (L) 12/09/2022 0023   PROT 6.7 10/06/2022 1546   ALBUMIN 3.3 (L) 12/09/2022 0023   ALBUMIN 4.1 10/06/2022 1546   AST 18 12/09/2022 0023   ALT 13 12/09/2022 0023   ALKPHOS 60 12/09/2022 0023   BILITOT 0.4 12/09/2022 0023    BILITOT 0.2 10/06/2022 1546   GFRNONAA >60 12/09/2022 0023   GFRNONAA 101 11/28/2019 1339   GFRAA 117 11/28/2019 1339   Lab Results  Component Value Date   CHOL 199 11/28/2019   HDL 87 11/28/2019   LDLCALC 97 11/28/2019   TRIG 65 11/28/2019   CHOLHDL 2.3 11/28/2019   No results found for: "HGBA1C" Lab Results  Component Value Date   VITAMINB12 448 03/01/2020   Lab Results  Component Value Date   TSH 1.366 09/20/2021       ASSESSMENT AND PLAN  Multiple sclerosis (HCC)  High risk medication use  Diplegia of both lower extremities (HCC)  Cervical myelopathy (HCC)  Dysesthesia  Other fatigue  Gait disturbance  She completed the second  year of Mavenclad.  Labs were fine in 11/2022 so will not need to be repeated.   She has had some progression (active SPMS) but no new lesions on MRI form earlier 2024   Difficulties in her leg are likely a combination of MS and cervical compressive myelopathy. Modafinil for MS related fatigue She will return to see [place] in 6 months  She should call sooner if new or worsening neurologic symptoms.   This visit is part of a comprehensive longitudinal care medical relationship regarding the patients primary diagnosis of MS and related concerns.   Mackensey Bolte A. Epimenio Foot, MD, [facility] 02/15/2023, 2:51 PM Certified in Neurology, Clinical Neurophysiology, Sleep Medicine and Neuroimaging  [facility] [address], [address] [place], [place] [address] [phone]:

## 2023-02-24 ENCOUNTER — Telehealth: Payer: Self-pay | Admitting: Neurology

## 2023-02-24 NOTE — Telephone Encounter (Signed)
Latoya with [facility] states pt is saying she was prescribed a new medication at her last appt but hasn't received it or doesn't remember the name. Requesting call back [phone]

## 2023-02-25 NOTE — Telephone Encounter (Signed)
Called Latoya back and advised her that Dr Epimenio Foot did recommend her start Modafinil for MS fatigue and informed it was sent to pharmacy. Latoya states that typically their PCP MD will write the medication for the pt and they will provide. They are asking that we fax the last OV from Dr Epimenio Foot to their fax number and can attention to [facility]. The fax number is [phone], and then the MD there will order for the patient. Advised out office will reopen on Monday and that we will get that information faxed over to their location on Monday. She was appreciative for the call back and verbalized understanding.

## 2023-02-28 NOTE — Telephone Encounter (Signed)
Last office note faxed to the # below confirmation received.

## 2023-10-18 ENCOUNTER — Ambulatory Visit (INDEPENDENT_AMBULATORY_CARE_PROVIDER_SITE_OTHER): Payer: Medicare (Managed Care) | Admitting: Family Medicine

## 2023-10-18 ENCOUNTER — Encounter: Payer: Self-pay | Admitting: Family Medicine

## 2023-10-18 VITALS — BP 131/81 | HR 71

## 2023-10-18 DIAGNOSIS — G959 Disease of spinal cord, unspecified: Secondary | ICD-10-CM

## 2023-10-18 DIAGNOSIS — R1310 Dysphagia, unspecified: Secondary | ICD-10-CM

## 2023-10-18 DIAGNOSIS — G8220 Paraplegia, unspecified: Secondary | ICD-10-CM

## 2023-10-18 DIAGNOSIS — Z79899 Other long term (current) drug therapy: Secondary | ICD-10-CM | POA: Diagnosis not present

## 2023-10-18 DIAGNOSIS — G35 Multiple sclerosis: Secondary | ICD-10-CM

## 2023-10-18 DIAGNOSIS — R269 Unspecified abnormalities of gait and mobility: Secondary | ICD-10-CM

## 2023-10-18 DIAGNOSIS — R5383 Other fatigue: Secondary | ICD-10-CM

## 2023-10-18 DIAGNOSIS — R208 Other disturbances of skin sensation: Secondary | ICD-10-CM

## 2023-10-18 DIAGNOSIS — E559 Vitamin D deficiency, unspecified: Secondary | ICD-10-CM

## 2023-10-18 NOTE — Patient Instructions (Signed)
 Below is our plan:  We will continue to monitor. I will order a speech therapy evaluation to evaluate your swallowing. Please let PCP know as well.   Please make sure you are staying well hydrated. I recommend 50-60 ounces daily. Well balanced diet and regular exercise encouraged. Consistent sleep schedule with 6-8 hours recommended.   Please continue follow up with care team as directed.   Follow up with Dr Vear in 6 months   You may receive a survey regarding today's visit. I encourage you to leave honest feed back as I do use this information to improve patient care. Thank you for seeing me today!

## 2023-10-18 NOTE — Progress Notes (Signed)
 Chief Complaint  Patient presents with   RM2MS    Pt is here Alone. Pt states that she has been going down hill bad lately. Pt states that she has a hard time swallowing and eating. Pt states that she feels weak. Pt states that she feels tired and sluggish lately. Pt states she can only drink a half of cup of coffee and feels full. Pt states she can try to eat 3 meals a day,but she can't due to her not being able to swallow. Pt has to have help getting dressed. She can transfer from chair to bathroom by herself.     HISTORY OF PRESENT ILLNESS:  10/18/23 ALL:  Faith [name] is a 68 y.o. female here today for follow up for SPMS. She was last seen by Dr Vear 01/2023. She took [facility] March/April 2023 (4 pills each of the cycles) and again May/June 2024 for year 2. MRI brain and cervical spine were stable with no new lesions 05/2022.   Today, she reports slowly worsened strength over the past couple years   She has more trouble transferring in/out of the wheelchair.  No falls while transferring   She spends most of her time in the wheelchair. Having a hard time holding her head up at times. More trouble swallowing over the past two months. She reports difficulty swallowing liquids and solids. Feels something stuck in throat. No significant weight loss that she knows of. Recent weight with Pace was 102.4lbs. Previously documented weight of 92lbs.   She has significant weakness and fatigue. Upper ext strength near normal, as reduced coordination and cannot write. Legs remain weak. She can transfer from chair to recliner or bedside commode. 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 She notes numbness in both legs. She is no longer taking  baclofen . Taking diazepam  5mg  TID. She continues propranolol  for tremor but it does not help much.  She also uses weighted utensils for her tremor.  She is also gabapentin  for dysesthesias with some benefit.       Bladder function is fine.  She has a sacral decubitus. It is  not completely healed.  A nurse comes Tue and Thurs to the house now and then but family dresses. Takes hydrocodone  for intractable pain from sacral wound.  She is followed by wound care.    Vision is poor (bilateral).   She takes eye drops.   Eyes are dry.      Cognition is doing well. She lives with her son and his girlfriend. She continues to have chronic insomnia.    HISTORY (copied from Dr Duncan previous note)  [name] is a 68 y.o. woman with multiple sclerosis.   Update 02/15/2023: She completed the second year of Mavenclad in June.  She had more GI issues and more fatigue.   She took [facility] March and April 2023 (4 pills each of the cycles) and again May/June 2024 for year 2.   She does not feel any effect of the Mavenclad either way.      She has active secondary progressive MS and has slowly worsened strength over the past couple years   She has more trouble transferring in/out of the wheelchair.  No falls while transferring   She spends most of her time in the wheelchair. Though strength is near normal in arms, she has reduced coordination and cannot write.  Legs are very weak and she can transfer from chair to recliner or bedside commode.   She has  leg > arm spasticity.     She notes numbness in both legs.     She is on baclofen  for spasticity, propranolol  for tremor but it does not help much.  She also uses weighted utensils for her tremor.  She is also gabapentin  for dysesthesias with some benefit.       Bladder function is fine.  She has a sacral decubitus that is being followed by wound management .  It is not completely healed  A nurse comes to the house now and then but family dresses .     Vision is poor (bilateral).   She takes eye drops.   Eyes are dry.      Cognition is doing well.       She notes more fatigue especially       She denies depression.   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       MS History: She was diagnosed with MS in 2007 after  presenting with gradual worsening of gait.  She had noted progressive worsening of gait before that year but the change seemed more rapid at the time.  Two to three years earlier she had no major disability.    She saw Dr. Milton in [place] who diagnosed er with MS.   She received IV steroids.   She was placed on Tysabri  and felt she was stable x 2 years.   However, due to JCV it was stopped.   She was switched to Ocrevus  but felt very poorly and feverish so did not do the second half of the dose.   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   She did not resume any medication after that one dose.    She feels she worsened after the surgery,  She was using a walker before then and needed a wheelchair afterwards.  She had her first course of Mavenclad March and April 2023.  She tolerated it well.  The second year of Mavenclad will be April and May 2024     Imaging personally reviewed: MRI of the brain 02/29/2020 shows scattered T2/FLAIR hyperintense foci in the hemispheres consistent with MS.  None of the foci enhanced.  There was mild atrophy.  Only 1 infratentorial focus in the right posterior pons.   MRI of the cervical spine 02/29/2020 showed a T2 hyperintense focus posteriorly towards the right adjacent to C4.  She also has fusion with posterior fixation at C3-C4 and foraminal narrowing to the left involving C7, to the left at C3 and bilaterally at C6.  The effusion resolved the spinal stenosis with possible myelopathy noted on her previous MRI   MRI of the thoracic spine 03/01/2020 showed multiple T2 hyperintense foci within the spinal cord most defined at T7, T10 and T11..  There was no enhancement.  No significant degenerative change.   MRI of the thoracic spine 04/12/2021 was unchanged compared to 03/01/2020.   MRI of the lumbar spine 04/12/2021 showed disc protrusion at L3-L4 that could affect the right L3 nerve root.   REVIEW OF SYSTEMS: Out of a complete 14 system review of  symptoms, the patient complains only of the following symptoms, and all other reviewed systems are negative.   ALLERGIES: Allergies  Allergen Reactions   Darvon [Propoxyphene] Other (See Comments)    Hallicuations   Penicillins Rash    Did it involve swelling of the face/tongue/throat, SOB, or low BP? Unknown Did it involve sudden or severe  rash/hives, skin peeling, or any reaction on the inside of your mouth or nose? Unknown Did you need to seek medical attention at a hospital or doctor's office? Unknown When did it last happen?      Unknown If all above answers are "NO", may proceed with cephalosporin use.      HOME MEDICATIONS: Outpatient Medications Prior to Visit  Medication Sig Dispense Refill   ascorbic acid  (VITAMIN C) 500 MG tablet Take 1 tablet (500 mg total) by mouth daily.     Cholecalciferol  (VITAMIN D3) 50 MCG (2000 UT) capsule Take 2,000 Units by mouth daily.     diazepam  (VALIUM ) 5 MG tablet Take 1 tablet (5 mg total) by mouth See admin instructions. TAKE (1) TABLET BY MOUTH EVERY SIX HOURS AS NEEDED FOR MUSCLE SPASMS. 10 tablet 0   feeding supplement (ENSURE ENLIVE / ENSURE PLUS) LIQD Take 237 mLs by mouth 3 (three) times daily between meals. 237 mL 0   gabapentin  (NEURONTIN ) 300 MG capsule Take 2 capsules (600 mg total) by mouth 3 (three) times daily. 180 capsule 1   HYDROcodone -acetaminophen  (NORCO/VICODIN) 5-325 MG tablet Take 1 tablet by mouth every 6 (six) hours as needed for moderate pain. 10 tablet 0   modafinil  (PROVIGIL ) 200 MG tablet Take 1 tablet (200 mg total) by mouth in the morning. 30 tablet 5   propranolol  ER (INDERAL  LA) 60 MG 24 hr capsule Take 60 mg by mouth daily.     acetaminophen  (TYLENOL ) 650 MG CR tablet Take 1,300 mg by mouth in the morning and at bedtime. Take 2 tablets by mouth BID for chronic pain (Patient not taking: Reported on 10/18/2023)     atorvastatin  (LIPITOR) 20 MG tablet Take 20 mg by mouth daily. (Patient not taking: Reported on  10/18/2023)     baclofen  (LIORESAL ) 10 MG tablet Take 1 tablet (10 mg total) by mouth 3 (three) times daily. (Patient not taking: Reported on 10/18/2023) 10 each 0   celecoxib  (CELEBREX ) 200 MG capsule Take 1 capsule (200 mg total) by mouth 2 (two) times daily as needed. (Patient not taking: Reported on 10/18/2023) 60 capsule 2   chlorhexidine  (PERIDEX ) 0.12 % solution Use as directed 15 mLs in the mouth or throat 4 (four) times daily. (Patient not taking: Reported on 10/18/2023)     Cladribine, 4 Tabs, (MAVENCLAD, 4 TABS,) 10 MG TBPK Take 10 mg by mouth daily. Take 10mg  by mouth on days 1-4. Repeat following month. (Patient not taking: Reported on 10/18/2023)     metoprolol succinate (TOPROL-XL) 50 MG 24 hr tablet Take 50 mg by mouth at bedtime. Take 1 tablet by mouth at bedtime for tremor (Patient not taking: Reported on 10/18/2023)     sodium hypochlorite (DAKIN'S 1/4 STRENGTH) 0.125 % SOLN Irrigate with as directed 2 (two) times daily at 10 AM and 5 PM. (Patient not taking: Reported on 10/18/2023) 473 mL 0   zinc  sulfate 220 (50 Zn) MG capsule Take 1 capsule (220 mg total) by mouth daily. (Patient not taking: Reported on 10/18/2023)     No facility-administered medications prior to visit.     PAST MEDICAL HISTORY: Past Medical History:  Diagnosis Date   Anemia    my whole life, Used to take iron   Arthritis    spine, lumbar   COPD (chronic obstructive pulmonary disease) (HCC)    Emphysema of lung (HCC)    GERD (gastroesophageal reflux disease) 04/12/2021   HTN (hypertension) 03/01/2020   Hyperlipidemia 11/28/2019   Multiple  sclerosis (HCC)    Narcolepsy    by history, has nightmares   Neuromuscular disorder (HCC)    leg issues from spine     PAST SURGICAL HISTORY: Past Surgical History:  Procedure Laterality Date   ANTERIOR CERVICAL DECOMP/DISCECTOMY FUSION N/A 01/19/2019   Procedure: ANTERIOR CERVICAL DECOMPRESSION/DISCECTOMY Cervical three - four;  Surgeon: Gillie Duncans, MD;   Location: [facility] OR;  Service: Neurosurgery;  Laterality: N/A;   EYE SURGERY Right    ORIF FACIAL FRACTURE     POSTERIOR CERVICAL LAMINECTOMY N/A 01/19/2019   Procedure: POSTERIOR CERVICAL LAMINECTOMY Removal of Synovial Cyst and posterior cervical fusion;  Surgeon: Gillie Duncans, MD;  Location: [facility] OR;  Service: Neurosurgery;  Laterality: N/A;   TEE WITHOUT CARDIOVERSION N/A 03/17/2020   Procedure: TRANSESOPHAGEAL ECHOCARDIOGRAM (TEE) WITH PROPOFOL ;  Surgeon: Debera Jayson MATSU, MD;  Location: [facility];  Service: Cardiovascular;  Laterality: N/A;   TUBAL LIGATION     tubaligation       FAMILY HISTORY: Family History  Problem Relation Age of Onset   Heart attack Mother    Kidney failure Mother    Diabetes Mother    Heart disease Mother    Hyperlipidemia Mother    Hypertension Mother    Heart attack Father 90   Hypertension Father    Hypertension Sister    Diabetes Sister    Other Brother        house fire   Hypertension Brother    Seizures Brother    Heart disease Brother        mitral valve   Arthritis Daughter        DJD, AS fibromyalgia   Polymyositis Daughter    Cancer Maternal Grandfather        lung   Heart disease Maternal Grandfather    Other Paternal Grandmother        house fire   Alcohol abuse Son      SOCIAL HISTORY: Social History   Socioeconomic History   Marital status: Single    Spouse name: Not on file   Number of children: 3   Years of education: 12   Highest education level: Not on file  Occupational History   Occupation: disabled    Comment: house painting  Tobacco Use   Smoking status: Every Day    Current packs/day: 0.25    Average packs/day: 0.3 packs/day for 45.0 years (11.3 ttl pk-yrs)    Types: Cigarettes   Smokeless tobacco: Never   Tobacco comments:    cutting down  Vaping Use   Vaping status: Never Used  Substance and Sexual Activity   Alcohol use: Not Currently    Alcohol/week: 1.0 standard drink of alcohol    Types: 1  Cans of beer per week   Drug use: No   Sexual activity: Not Currently    Birth control/protection: Post-menopausal  Other Topics Concern   Not on file  Social History Narrative   Patient lives with her son. Darden Sieving, age 67, frequently visits   Social Drivers of Health   Financial Resource Strain: Not on file  Food Insecurity: Not on file  Transportation Needs: Not on file  Physical Activity: Not on file  Stress: Not on file  Social Connections: Not on file  Intimate Partner Violence: Not on file     PHYSICAL EXAM  Vitals:   10/18/23 1428  BP: 131/81  Pulse: 71  SpO2: 96%   There is no height or weight on file to calculate  BMI.  Generalized: Well developed, in no acute distress  Cardiology: normal rate and rhythm, no murmur auscultated  Respiratory: clear to auscultation bilaterally    Neurological examination  Mentation: Alert oriented to time, place, history taking. Follows all commands speech and language fluent Cranial nerve II-XII: Pupils were equal round reactive to light. Extraocular movements were full, visual field were full on confrontational test. Facial sensation and strength were normal. Uvula tongue midline. Head turning and shoulder shrug  were normal and symmetric. Motor: The motor testing reveals 5 over 5 strength of bilateral upper ext, 4/5 bilateral lower ext. SABRA Richard symmetric motor tone is noted throughout.  Sensory: Sensory testing is intact to soft touch on all 4 extremities. No evidence of extinction is noted.  Gait and station: Gait not assessed, can not stand, in motorized wheelchair    DIAGNOSTIC DATA (LABS, IMAGING, TESTING) - I reviewed patient records, labs, notes, testing and imaging myself where available.  Lab Results  Component Value Date   WBC 7.5 12/09/2022   HGB 13.2 12/09/2022   HCT 40.0 12/09/2022   MCV 93.2 12/09/2022   PLT 177 12/09/2022      Component Value Date/Time   NA 137 12/09/2022 0023   NA 140 10/06/2022  1546   K 3.5 12/09/2022 0023   CL 104 12/09/2022 0023   CO2 24 12/09/2022 0023   GLUCOSE 125 (H) 12/09/2022 0023   BUN 10 12/09/2022 0023   BUN 15 10/06/2022 1546   CREATININE 0.53 12/09/2022 0023   CREATININE 0.53 11/28/2019 1339   CALCIUM  8.4 (L) 12/09/2022 0023   PROT 6.2 (L) 12/09/2022 0023   PROT 6.7 10/06/2022 1546   ALBUMIN  3.3 (L) 12/09/2022 0023   ALBUMIN  4.1 10/06/2022 1546   AST 18 12/09/2022 0023   ALT 13 12/09/2022 0023   ALKPHOS 60 12/09/2022 0023   BILITOT 0.4 12/09/2022 0023   BILITOT 0.2 10/06/2022 1546   GFRNONAA >60 12/09/2022 0023   GFRNONAA 101 11/28/2019 1339   GFRAA 117 11/28/2019 1339   Lab Results  Component Value Date   CHOL 199 11/28/2019   HDL 87 11/28/2019   LDLCALC 97 11/28/2019   TRIG 65 11/28/2019   CHOLHDL 2.3 11/28/2019   No results found for: HGBA1C Lab Results  Component Value Date   VITAMINB12 448 03/01/2020   Lab Results  Component Value Date   TSH 1.366 09/20/2021        No data to display               No data to display           ASSESSMENT AND PLAN  68 y.o. year old female  has a past medical history of Anemia, Arthritis, COPD (chronic obstructive pulmonary disease) (HCC), Emphysema of lung (HCC), GERD (gastroesophageal reflux disease) (04/12/2021), HTN (hypertension) (03/01/2020), Hyperlipidemia (11/28/2019), Multiple sclerosis (HCC), Narcolepsy, and Neuromuscular disorder (HCC). here with    Secondary progressive multiple sclerosis (HCC) - Plan: CBC with Differential/Platelets, CMP  High risk medication use  Diplegia of both lower extremities (HCC)  Cervical myelopathy (HCC)  Dysesthesia  Other fatigue  Gait disturbance  Vitamin D  deficiency - Plan: Vitamin D , 25-hydroxy  Dysphagia, unspecified type - Plan: SLP clinical swallow evaluation  Acasia A Kersten reports progressive worsening of generalized weakness and fatigue. Now having trouble swallowing over the past 2 months. She will continue  current treatment plan. I will refer her to ST for swallowing eval. Labs updated, today. I have encouraged her to remain  as active as she can, mentally and physically. Healthy lifestyle habits encouraged. She will follow up with PCP as directed. She will return to see Dr Vear in 6 months, sooner if needed. She verbalizes understanding and agreement with this plan.   Orders Placed This Encounter  Procedures   CBC with Differential/Platelets   CMP   Vitamin D , 25-hydroxy   SLP clinical swallow evaluation    Standing Status:   Future    Expiration Date:   10/17/2024     No orders of the defined types were placed in this encounter.  I personally spent a total of 45 minutes in the care of the patient today including preparing to see the patient, getting/reviewing separately obtained history, performing a medically appropriate exam/evaluation, counseling and educating, placing orders, referring and communicating with other health care professionals, documenting clinical information in the EHR, independently interpreting results, and communicating results.   Greig Forbes, MSN, FNP-C 10/18/2023, 4:04 PM  [facility] [address], [address] [place], [place] [address] [phone]

## 2023-10-19 ENCOUNTER — Ambulatory Visit: Payer: Self-pay | Admitting: Family Medicine

## 2023-10-19 LAB — CBC WITH DIFFERENTIAL/PLATELET
Basophils Absolute: 0 x10E3/uL (ref 0.0–0.2)
Basos: 0 %
EOS (ABSOLUTE): 0.3 x10E3/uL (ref 0.0–0.4)
Eos: 4 %
Hematocrit: 43.1 % (ref 34.0–46.6)
Hemoglobin: 14.1 g/dL (ref 11.1–15.9)
Immature Grans (Abs): 0 x10E3/uL (ref 0.0–0.1)
Immature Granulocytes: 0 %
Lymphocytes Absolute: 0.7 x10E3/uL (ref 0.7–3.1)
Lymphs: 10 %
MCH: 31.8 pg (ref 26.6–33.0)
MCHC: 32.7 g/dL (ref 31.5–35.7)
MCV: 97 fL (ref 79–97)
Monocytes Absolute: 0.5 x10E3/uL (ref 0.1–0.9)
Monocytes: 8 %
Neutrophils Absolute: 5.1 x10E3/uL (ref 1.4–7.0)
Neutrophils: 77 %
Platelets: 201 x10E3/uL (ref 150–450)
RBC: 4.44 x10E6/uL (ref 3.77–5.28)
RDW: 13.2 % (ref 11.7–15.4)
WBC: 6.6 x10E3/uL (ref 3.4–10.8)

## 2023-10-19 LAB — COMPREHENSIVE METABOLIC PANEL WITH GFR
ALT: 15 IU/L (ref 0–32)
AST: 16 IU/L (ref 0–40)
Albumin: 4.2 g/dL (ref 3.9–4.9)
Alkaline Phosphatase: 98 IU/L (ref 44–121)
BUN/Creatinine Ratio: 8 — ABNORMAL LOW (ref 12–28)
BUN: 4 mg/dL — ABNORMAL LOW (ref 8–27)
Bilirubin Total: 0.3 mg/dL (ref 0.0–1.2)
CO2: 25 mmol/L (ref 20–29)
Calcium: 9.4 mg/dL (ref 8.7–10.3)
Chloride: 97 mmol/L (ref 96–106)
Creatinine, Ser: 0.51 mg/dL — ABNORMAL LOW (ref 0.57–1.00)
Globulin, Total: 2.1 g/dL (ref 1.5–4.5)
Glucose: 93 mg/dL (ref 70–99)
Potassium: 4.5 mmol/L (ref 3.5–5.2)
Sodium: 136 mmol/L (ref 134–144)
Total Protein: 6.3 g/dL (ref 6.0–8.5)
eGFR: 102 mL/min/1.73 (ref >59)

## 2023-10-19 LAB — VITAMIN D 25 HYDROXY (VIT D DEFICIENCY, FRACTURES): Vit D, 25-Hydroxy: 55.6 ng/mL (ref 30.0–100.0)

## 2023-10-23 ENCOUNTER — Other Ambulatory Visit: Payer: Self-pay

## 2023-10-23 ENCOUNTER — Encounter (HOSPITAL_COMMUNITY): Payer: Self-pay | Admitting: Emergency Medicine

## 2023-10-23 ENCOUNTER — Emergency Department (HOSPITAL_COMMUNITY)
Admission: EM | Admit: 2023-10-23 | Discharge: 2023-10-24 | Disposition: A | Discharge status: Home / Self Care | Payer: Medicare (Managed Care) | Attending: Emergency Medicine | Admitting: Emergency Medicine

## 2023-10-23 ENCOUNTER — Emergency Department (HOSPITAL_COMMUNITY): Payer: Medicare (Managed Care)

## 2023-10-23 DIAGNOSIS — F17210 Nicotine dependence, cigarettes, uncomplicated: Secondary | ICD-10-CM | POA: Diagnosis not present

## 2023-10-23 DIAGNOSIS — Z79899 Other long term (current) drug therapy: Secondary | ICD-10-CM | POA: Insufficient documentation

## 2023-10-23 DIAGNOSIS — W050XXA Fall from non-moving wheelchair, initial encounter: Secondary | ICD-10-CM | POA: Diagnosis not present

## 2023-10-23 DIAGNOSIS — J449 Chronic obstructive pulmonary disease, unspecified: Secondary | ICD-10-CM | POA: Diagnosis not present

## 2023-10-23 DIAGNOSIS — M25561 Pain in right knee: Secondary | ICD-10-CM | POA: Insufficient documentation

## 2023-10-23 DIAGNOSIS — I10 Essential (primary) hypertension: Secondary | ICD-10-CM | POA: Diagnosis not present

## 2023-10-23 MED ORDER — OXYCODONE-ACETAMINOPHEN 5-325 MG PO TABS
1.0000 | ORAL_TABLET | Freq: Once | ORAL | Status: AC
  Administered 2023-10-24: 1 via ORAL
  Filled 2023-10-23: qty 1

## 2023-10-23 NOTE — ED Provider Notes (Signed)
 Emergency Department Provider Note  TRIAGE NOTE: Pt here after fall while trying to maneuver from her wheelchair to a chair. C/o R knee pain.  HISTORY  Chief Complaint Fall   HPI Faith [name] is a 68 y.o. female  who presents after a fall while attempting to transfer from her wheelchair to a chair, resulting in an injury to her right knee. The incident occurred when she reached down from her wheelchair, causing her to slide out and her knee to become jammed between a chair and a dresser, twisting in the process. The patient reports significant pain localized more on the inside of the knee, though she is unsure if it is swollen. She has a history of using hydrocodone  for pain but has not taken any today. The patient lives with her son and typically uses a wheelchair, though she can transfer short distances with assistance. History was obtained from the patient. PMH Past Medical History:  Diagnosis Date   Anemia    my whole life, Used to take iron   Arthritis    spine, lumbar   COPD (chronic obstructive pulmonary disease) (HCC)    Emphysema of lung (HCC)    GERD (gastroesophageal reflux disease) 04/12/2021   HTN (hypertension) 03/01/2020   Hyperlipidemia 11/28/2019   Multiple sclerosis (HCC)    Narcolepsy    by history, has nightmares   Neuromuscular disorder (HCC)    leg issues from spine    Home Medications Prior to Admission medications   Medication Sig Start Date End Date Taking? Authorizing Provider  ascorbic acid  (VITAMIN C) 500 MG tablet Take 1 tablet (500 mg total) by mouth daily. 02/18/21   Vann, Jessica U, DO  Cholecalciferol  (VITAMIN D3) 50 MCG (2000 UT) capsule Take 2,000 Units by mouth daily. 02/11/21   [provider]  diazepam  (VALIUM ) 5 MG tablet Take 1 tablet (5 mg total) by mouth See admin instructions. TAKE (1) TABLET BY MOUTH EVERY SIX HOURS AS NEEDED FOR MUSCLE SPASMS. 02/17/21   Vann, Jessica U, DO  feeding supplement (ENSURE ENLIVE / ENSURE  PLUS) LIQD Take 237 mLs by mouth 3 (three) times daily between meals. 12/12/20   Ezenduka, Nkeiruka J, MD  gabapentin  (NEURONTIN ) 300 MG capsule Take 2 capsules (600 mg total) by mouth 3 (three) times daily. 02/05/19   Angiulli, Toribio PARAS, PA-C  HYDROcodone -acetaminophen  (NORCO/VICODIN) 5-325 MG tablet Take 1 tablet by mouth every 6 (six) hours as needed for moderate pain. 02/17/21   Vann, Jessica U, DO  modafinil  (PROVIGIL ) 200 MG tablet Take 1 tablet (200 mg total) by mouth in the morning. 02/15/23   Sater, Charlie LABOR, MD  propranolol  ER (INDERAL  LA) 60 MG 24 hr capsule Take 60 mg by mouth daily.    [provider]    Social History Social History   Tobacco Use   Smoking status: Every Day    Current packs/day: 0.25    Average packs/day: 0.3 packs/day for 45.0 years (11.3 ttl pk-yrs)    Types: Cigarettes   Smokeless tobacco: Never   Tobacco comments:    cutting down  Vaping Use   Vaping status: Never Used  Substance Use Topics   Alcohol use: Not Currently    Alcohol/week: 1.0 standard drink of alcohol    Types: 1 Cans of beer per week   Drug use: No    Review of Systems: Documented in HPI ____________________________________________  PHYSICAL EXAM: VITAL SIGNS: Triage: Blood pressure 130/80, pulse 68, temperature 97.7 F (36.5 C), temperature  source Oral, resp. rate 18, height 5' 3 (1.6 m), weight 46.7 kg, SpO2 96%.  Vitals:   10/24/23 0100 10/24/23 0200 10/24/23 0245 10/24/23 0300  BP:  113/72 126/76 127/77  Pulse: 64 62 64 63  Resp:  18 16 16   Temp:    98 F (36.7 C)  TempSrc:      SpO2: 96% 95% 97% 97%  Weight:      Height:        Physical Exam Vitals and nursing note reviewed.  Constitutional:      Appearance: She is well-developed.  HENT:     Head: Normocephalic and atraumatic.  Cardiovascular:     Rate and Rhythm: Normal rate and regular rhythm.  Pulmonary:     Effort: No respiratory distress.     Breath sounds: No stridor.  Abdominal:      General: There is no distension.  Musculoskeletal:     Cervical back: Normal range of motion.     Comments: Ttp to right medial knee  Neurological:     Mental Status: She is alert.       ____________________________________________   LABS (all labs ordered are listed, but only abnormal results are displayed)  Labs Reviewed - No data to display ____________________________________________  EKG   EKG Interpretation Date/Time:    Ventricular Rate:    PR Interval:    QRS Duration:    QT Interval:    QTC Calculation:   R Axis:      Text Interpretation:          ____________________________________________  RADIOLOGY  DG Knee Complete 4 Views Right Result Date: 10/24/2023 CLINICAL DATA:  Pt here after fall while trying to maneuver from her wheelchair to a chair. C/o R knee pain EXAM: RIGHT KNEE - COMPLETE 4+ VIEW COMPARISON:  Xr right knee 09/18/21 FINDINGS: No evidence of fracture, dislocation, or joint effusion. No evidence of arthropathy or other focal bone abnormality. Soft tissues are unremarkable. Vascular calcification. IMPRESSION: No acute displaced fracture or dislocation. Electronically Signed   By: Morgane  Naveau M.D.   On: 10/24/2023 00:16   ____________________________________________  PROCEDURES  Procedure(s) performed:   Procedures ____________________________________________  INITIAL IMPRESSION / ASSESSMENT AND PLAN   Initial Evaluation:  Patient fell while transferring from a wheelchair to a chair and jammed her right knee, causing pain and possible swelling. Plan:  Administer pain medication Obtain X-ray of the right knee to assess alignment and rule out fractures Consider MRI if no immediate improvement is observed within a week or two.  Initial IIk:Ipqqzmzwupjo Diagnosis: Differential diagnosis includes but is not limited to: knee sprain, meniscus tear, ligament injury, knee fracture, and contusion.  ED Course  The patient is a 68 year old  female who presented to the ED after a fall while attempting to transfer from a wheelchair to a chair. She reports her right knee got jammed between the chair and a dresser, resulting in a twisting injury. The patient denies taking her hydrocodone  today. Vital signs are stable. An X-ray of the knee was ordered to assess for fractures or dislocations. The patient was advised that an MRI might be necessary if symptoms do not improve within a week or two, to evaluate for potential ligament or meniscus injuries. The patient lives with her son and uses a wheelchair, with limited ambulation for short transfers.    XR viewed and interpreted by myself without obvious fracture/dislocation. Suspect possible strain vs sprain vs tear. Will fu w/ PCP in a week or so  if no improvement or if worsening symptoms.   FINAL IMPRESSION Final diagnoses:  Acute pain of right knee      Disposition A medical screening exam was performed and I feel the patient has had an appropriate workup for their chief complaint at this time and likelihood of emergent condition existing is low. They have been counseled on decision, DISCHARGE, follow up and which symptoms necessitate immediate return to the emergency department. They or their family verbally stated understanding and agreement with plan and discharged in stable condition.   ____________________________________________   NEW OUTPATIENT MEDICATIONS STARTED DURING THIS VISIT:  Discharge Medication List as of 10/24/2023  2:32 AM      Note:  This note was prepared with assistance of Dragon voice recognition software. Occasional wrong-word or sound-a-like substitutions may have occurred due to the inherent limitations of voice recognition software.    Tamarion Haymond, Selinda, MD 10/24/23 [phone]

## 2023-10-23 NOTE — ED Triage Notes (Signed)
 Pt here after fall while trying to maneuver from her wheelchair to a chair. C/o R knee pain.

## 2023-10-24 NOTE — Discharge Instructions (Signed)
 Use your home pain medicines as needed for the knee pain, if not improving in 1-2 weeks, please follow up with your primary doctor or orthopedics for reevaluation and consideration of MRI to rule out ligament/meniscus damage.

## 2023-12-28 ENCOUNTER — Other Ambulatory Visit (HOSPITAL_COMMUNITY): Payer: Self-pay | Admitting: Nurse Practitioner

## 2023-12-28 ENCOUNTER — Other Ambulatory Visit: Payer: Self-pay | Admitting: Nurse Practitioner

## 2023-12-28 DIAGNOSIS — Z1231 Encounter for screening mammogram for malignant neoplasm of breast: Secondary | ICD-10-CM

## 2024-01-19 ENCOUNTER — Ambulatory Visit: Payer: Medicare (Managed Care)

## 2024-01-20 ENCOUNTER — Encounter (HOSPITAL_COMMUNITY): Payer: Self-pay

## 2024-01-20 ENCOUNTER — Ambulatory Visit (HOSPITAL_COMMUNITY)
Admission: RE | Admit: 2024-01-20 | Discharge: 2024-01-20 | Disposition: A | Discharge status: Home / Self Care | Payer: Medicare (Managed Care) | Source: Ambulatory Visit | Attending: Nurse Practitioner | Admitting: Nurse Practitioner

## 2024-01-20 DIAGNOSIS — Z1231 Encounter for screening mammogram for malignant neoplasm of breast: Secondary | ICD-10-CM | POA: Diagnosis present

## 2024-02-21 ENCOUNTER — Telehealth (INDEPENDENT_AMBULATORY_CARE_PROVIDER_SITE_OTHER): Payer: Self-pay

## 2024-02-21 ENCOUNTER — Encounter (INDEPENDENT_AMBULATORY_CARE_PROVIDER_SITE_OTHER): Payer: Self-pay | Admitting: Gastroenterology

## 2024-02-21 ENCOUNTER — Telehealth (INDEPENDENT_AMBULATORY_CARE_PROVIDER_SITE_OTHER): Payer: Self-pay | Admitting: *Deleted

## 2024-02-21 ENCOUNTER — Ambulatory Visit (INDEPENDENT_AMBULATORY_CARE_PROVIDER_SITE_OTHER): Payer: Medicare (Managed Care) | Admitting: Gastroenterology

## 2024-02-21 VITALS — BP 139/82 | HR 66 | Temp 98.2°F | Ht 63.0 in | Wt 102.2 lb

## 2024-02-21 DIAGNOSIS — G35D Multiple sclerosis, unspecified: Secondary | ICD-10-CM | POA: Diagnosis not present

## 2024-02-21 DIAGNOSIS — R1310 Dysphagia, unspecified: Secondary | ICD-10-CM | POA: Diagnosis not present

## 2024-02-21 DIAGNOSIS — E611 Iron deficiency: Secondary | ICD-10-CM | POA: Diagnosis not present

## 2024-02-21 DIAGNOSIS — F17210 Nicotine dependence, cigarettes, uncomplicated: Secondary | ICD-10-CM | POA: Diagnosis not present

## 2024-02-21 DIAGNOSIS — R1319 Other dysphagia: Secondary | ICD-10-CM | POA: Insufficient documentation

## 2024-02-21 DIAGNOSIS — D649 Anemia, unspecified: Secondary | ICD-10-CM | POA: Insufficient documentation

## 2024-02-21 DIAGNOSIS — D500 Iron deficiency anemia secondary to blood loss (chronic): Secondary | ICD-10-CM

## 2024-02-21 NOTE — Telephone Encounter (Signed)
 Spoke to [place] at [facility], patient's speech therapist regarding speech therapy & MBSS - gave her Dr Jorie recommendation and she will reach out to patient's PCP at [facility] and have them write an order for the MBSS so she get so that for the patient

## 2024-02-21 NOTE — Patient Instructions (Signed)
 It was very nice to meet you today, as dicussed with will plan for the following :  1) Upper endoscopy  2) Speech and swallow evaluation

## 2024-02-21 NOTE — Telephone Encounter (Signed)
 Spoke with patient in the office, scheduled EGD/DIL for 03/23/2024 at 11:30am. Instructions given to patient in the office.

## 2024-02-21 NOTE — Progress Notes (Signed)
 Faith [name] , M.D. Gastroenterology & Hepatology [facility] [address] [place], [place] [address] Primary Care Physician: Cloria Annabella CROME, DO [address] [place] [address]  Chief Complaint: Dysphagia  History of Present Illness: Faith [name] is a 68 y.o. female with multiple sclerosis, COPD, chronic smoker who presents for evaluation of solid food dysphagia  Patient is accompanied with daughter in the clinic.  Patient reports progressive solid food dysphagia for the past 6 months.  She reports as if food is getting stuck in upper chest where she has to drink fluid after every bite of solid food.  Initially patient had a loss but now is gaining weight The patient denies having any nausea, vomiting, fever, chills, hematochezia, melena, hematemesis, abdominal distention, abdominal pain, diarrhea, jaundice, pruritus   Last EGD: Reports upper endoscopy 3 to 5 years ago at [facility]; records unavailable Last Colonoscopy:none  Social: Chronic cigarette smoker  Last labs in July 2025 normal liver enzymes hemoglobin 14.1 platelet 201  Past Medical History: Past Medical History:  Diagnosis Date   Anemia    my whole life, Used to take iron   Arthritis    spine, lumbar   COPD (chronic obstructive pulmonary disease) (HCC)    Emphysema of lung (HCC)    GERD (gastroesophageal reflux disease) 04/12/2021   HTN (hypertension) 03/01/2020   Hyperlipidemia 11/28/2019   Multiple sclerosis    Narcolepsy    by history, has nightmares   Neuromuscular disorder (HCC)    leg issues from spine    Past Surgical History: Past Surgical History:  Procedure Laterality Date   ANTERIOR CERVICAL DECOMP/DISCECTOMY FUSION N/A 01/19/2019   Procedure: ANTERIOR CERVICAL DECOMPRESSION/DISCECTOMY Cervical three - four;  Surgeon: Gillie Duncans, MD;  Location: [facility] OR;  Service: Neurosurgery;  Laterality: N/A;   EYE SURGERY Right    ORIF FACIAL  FRACTURE     POSTERIOR CERVICAL LAMINECTOMY N/A 01/19/2019   Procedure: POSTERIOR CERVICAL LAMINECTOMY Removal of Synovial Cyst and posterior cervical fusion;  Surgeon: Gillie Duncans, MD;  Location: [facility] OR;  Service: Neurosurgery;  Laterality: N/A;   TEE WITHOUT CARDIOVERSION N/A 03/17/2020   Procedure: TRANSESOPHAGEAL ECHOCARDIOGRAM (TEE) WITH PROPOFOL ;  Surgeon: Debera Jayson MATSU, MD;  Location: [facility] ENDO SUITE;  Service: Cardiovascular;  Laterality: N/A;   TUBAL LIGATION     tubaligation      Family History: Family History  Problem Relation Age of Onset   Heart attack Mother    Kidney failure Mother    Diabetes Mother    Heart disease Mother    Hyperlipidemia Mother    Hypertension Mother    Heart attack Father 47   Hypertension Father    Hypertension Sister    Diabetes Sister    Other Brother        house fire   Hypertension Brother    Seizures Brother    Heart disease Brother        mitral valve   Arthritis Daughter        DJD, AS fibromyalgia   Polymyositis Daughter    Cancer Maternal Grandfather        lung   Heart disease Maternal Grandfather    Other Paternal Grandmother        house fire   Alcohol abuse Son     Social History:Tobacco Use History[1] Social History   Substance and Sexual Activity  Alcohol Use Not Currently   Alcohol/week: 1.0 standard drink of alcohol   Types:  1 Cans of beer per week   Social History   Substance and Sexual Activity  Drug Use No    Allergies: Allergies[2]  Medications: Current Outpatient Medications  Medication Sig Dispense Refill   ascorbic acid  (VITAMIN C) 500 MG tablet Take 1 tablet (500 mg total) by mouth daily.     Cholecalciferol  (VITAMIN D3) 50 MCG (2000 UT) capsule Take 2,000 Units by mouth daily.     diazepam  (VALIUM ) 5 MG tablet Take 1 tablet (5 mg total) by mouth See admin instructions. TAKE (1) TABLET BY MOUTH EVERY SIX HOURS AS NEEDED FOR MUSCLE SPASMS. 10 tablet 0   feeding supplement (ENSURE ENLIVE /  ENSURE PLUS) LIQD Take 237 mLs by mouth 3 (three) times daily between meals. 237 mL 0   gabapentin  (NEURONTIN ) 300 MG capsule Take 2 capsules (600 mg total) by mouth 3 (three) times daily. 180 capsule 1   HYDROcodone -acetaminophen  (NORCO/VICODIN) 5-325 MG tablet Take 1 tablet by mouth every 6 (six) hours as needed for moderate pain. 10 tablet 0   modafinil  (PROVIGIL ) 200 MG tablet Take 1 tablet (200 mg total) by mouth in the morning. 30 tablet 5   pantoprazole  (PROTONIX ) 20 MG tablet Take 20 mg by mouth daily.     propranolol  ER (INDERAL  LA) 60 MG 24 hr capsule Take 60 mg by mouth daily.     No current facility-administered medications for this visit.    Review of Systems: GENERAL: negative for malaise, night sweats HEENT: No changes in hearing or vision, no nose bleeds or other nasal problems. NECK: Negative for lumps, goiter, pain and significant neck swelling RESPIRATORY: Negative for cough, wheezing CARDIOVASCULAR: Negative for chest pain, leg swelling, palpitations, orthopnea GI: SEE HPI MUSCULOSKELETAL: Negative for joint pain or swelling, back pain, and muscle pain. SKIN: Negative for lesions, rash HEMATOLOGY Negative for prolonged bleeding, bruising easily, and swollen nodes. ENDOCRINE: Negative for cold or heat intolerance, polyuria, polydipsia and goiter. NEURO: negative for tremor, gait imbalance, syncope and seizures. The remainder of the review of systems is noncontributory.   Physical Exam: BP 139/82   Pulse 66   Temp 98.2 F (36.8 C)   Ht 5' 3 (1.6 m)   Wt 102 lb 3.2 oz (46.4 kg) Comment: pt reported;unable to stand  BMI 18.10 kg/m  GENERAL: The patient is AO x3, in no acute distress. HEENT: Head is normocephalic and atraumatic. EOMI are intact. Mouth is well hydrated and without lesions. NECK: Supple. No masses LUNGS: Clear to auscultation. No presence of rhonchi/wheezing/rales. Adequate chest expansion HEART: RRR, normal s1 and s2. ABDOMEN: Soft, nontender, no  guarding, no peritoneal signs, and nondistended. BS +. No masses.   Imaging/Labs: as above     Latest Ref Rng & Units 10/18/2023    3:17 PM 12/09/2022   12:23 AM 10/06/2022    3:46 PM  CBC  WBC 3.4 - 10.8 x10E3/uL 6.6  7.5  7.7   Hemoglobin 11.1 - 15.9 g/dL 85.8  86.7  86.2   Hematocrit 34.0 - 46.6 % 43.1  40.0  41.7   Platelets 150 - 450 x10E3/uL 201  177  238    Lab Results  Component Value Date   IRON 6 (L) 09/20/2021   TIBC 186 (L) 09/20/2021   FERRITIN 39 02/16/2021    I personally reviewed and interpreted the available labs, imaging and endoscopic files.  Impression and Plan: BETRICE WANAT is a 68 y.o. female with multiple sclerosis, COPD, chronic smoker who presents for evaluation of  solid food dysphagia  #Solid food dysphagia  Patient has progressive solid food dysphagia.  This could be esophageal web ring or stricture Although given chronic smoking need to rule out malignancy  Although patient has multiple sclerosis and dysphagia is a frequent complication that occurs at all stages of disease.  This could be oropharyngeal dysphagia as well and can be complicated with aspiration pneumonia and malnutrition.  Though upper esophageal sphincter dysfunction has also been reported in patient with multiple sclerosis  At this time we will proceed with upper endoscopy with dilation Speech and swallow evaluation and possible modified barium swallow  I thoroughly discussed with the patient the procedure, including the risks involved. Patient understands what the procedure involves including the benefits and any risks. Patient understands alternatives to the proposed procedure. Risks including (but not limited to) bleeding, tearing of the lining (perforation), rupture of adjacent organs, problems with heart and lung function, infection, and medication reactions. A small percentage of complications may require surgery, hospitalization, repeat endoscopic procedure, and/or transfusion.   Patient understood and agreed.    #Iron deficiency without anemia  Last iron profile checked 2023 with iron saturation of 3% suggesting iron deficiency most recent labs without anemia  If repeat iron profile with iron deficiency may discuss colonoscopy otherwise patient can pursue colon cancer screening with two-step stool-based testing with FIT or Cologuard as she is average risk for colon cancer   All questions were answered.      Xuan Mateus Faizan Kaydence Baba, MD Gastroenterology and Hepatology [facility]   This chart has been completed using [facility] Dictation software, and while attempts have been made to ensure accuracy , certain words and phrases may not be transcribed as intended      [1]  Social History Tobacco Use  Smoking Status Every Day   Current packs/day: 0.25   Average packs/day: 0.3 packs/day for 45.0 years (11.3 ttl pk-yrs)   Types: Cigarettes  Smokeless Tobacco Never  Tobacco Comments   cutting down  [2]  Allergies Allergen Reactions   Darvon [Propoxyphene] Other (See Comments)    Hallicuations   Penicillins Rash    Did it involve swelling of the face/tongue/throat, SOB, or low BP? Unknown Did it involve sudden or severe rash/hives, skin peeling, or any reaction on the inside of your mouth or nose? Unknown Did you need to seek medical attention at a hospital or doctor's office? Unknown When did it last happen?      Unknown If all above answers are NO, may proceed with cephalosporin use.

## 2024-03-12 ENCOUNTER — Telehealth: Payer: Self-pay | Admitting: Neurology

## 2024-03-12 NOTE — Telephone Encounter (Signed)
 LVM informing pt her 3/25 appt needs to be rescheduled.macular degeneration is will be out.

## 2024-03-21 ENCOUNTER — Encounter (HOSPITAL_COMMUNITY)
Admission: RE | Admit: 2024-03-21 | Discharge: 2024-03-21 | Disposition: A | Discharge status: Home / Self Care | Payer: Medicare (Managed Care) | Source: Ambulatory Visit | Attending: Gastroenterology | Admitting: Gastroenterology

## 2024-03-21 ENCOUNTER — Encounter (HOSPITAL_COMMUNITY): Payer: Self-pay

## 2024-03-22 ENCOUNTER — Emergency Department (HOSPITAL_COMMUNITY)
Admission: EM | Admit: 2024-03-22 | Discharge: 2024-03-23 | Disposition: A | Discharge status: Home / Self Care | Payer: Medicare (Managed Care)

## 2024-03-22 ENCOUNTER — Other Ambulatory Visit: Payer: Self-pay

## 2024-03-22 ENCOUNTER — Encounter (HOSPITAL_COMMUNITY): Payer: Self-pay

## 2024-03-22 DIAGNOSIS — Z8249 Family history of ischemic heart disease and other diseases of the circulatory system: Secondary | ICD-10-CM | POA: Diagnosis not present

## 2024-03-22 DIAGNOSIS — G35D Multiple sclerosis, unspecified: Secondary | ICD-10-CM | POA: Diagnosis not present

## 2024-03-22 DIAGNOSIS — I739 Peripheral vascular disease, unspecified: Secondary | ICD-10-CM | POA: Diagnosis not present

## 2024-03-22 DIAGNOSIS — K644 Residual hemorrhoidal skin tags: Secondary | ICD-10-CM | POA: Diagnosis not present

## 2024-03-22 DIAGNOSIS — K2289 Other specified disease of esophagus: Secondary | ICD-10-CM | POA: Diagnosis not present

## 2024-03-22 DIAGNOSIS — D509 Iron deficiency anemia, unspecified: Secondary | ICD-10-CM | POA: Insufficient documentation

## 2024-03-22 DIAGNOSIS — K449 Diaphragmatic hernia without obstruction or gangrene: Secondary | ICD-10-CM | POA: Insufficient documentation

## 2024-03-22 DIAGNOSIS — F17210 Nicotine dependence, cigarettes, uncomplicated: Secondary | ICD-10-CM | POA: Insufficient documentation

## 2024-03-22 DIAGNOSIS — R197 Diarrhea, unspecified: Secondary | ICD-10-CM | POA: Diagnosis present

## 2024-03-22 DIAGNOSIS — Z79899 Other long term (current) drug therapy: Secondary | ICD-10-CM | POA: Insufficient documentation

## 2024-03-22 DIAGNOSIS — Z8261 Family history of arthritis: Secondary | ICD-10-CM | POA: Diagnosis not present

## 2024-03-22 DIAGNOSIS — M1990 Unspecified osteoarthritis, unspecified site: Secondary | ICD-10-CM | POA: Insufficient documentation

## 2024-03-22 DIAGNOSIS — J439 Emphysema, unspecified: Secondary | ICD-10-CM | POA: Insufficient documentation

## 2024-03-22 DIAGNOSIS — K219 Gastro-esophageal reflux disease without esophagitis: Secondary | ICD-10-CM | POA: Diagnosis not present

## 2024-03-22 DIAGNOSIS — K2970 Gastritis, unspecified, without bleeding: Secondary | ICD-10-CM | POA: Diagnosis not present

## 2024-03-22 DIAGNOSIS — R531 Weakness: Secondary | ICD-10-CM | POA: Insufficient documentation

## 2024-03-22 DIAGNOSIS — K31819 Angiodysplasia of stomach and duodenum without bleeding: Secondary | ICD-10-CM | POA: Insufficient documentation

## 2024-03-22 DIAGNOSIS — R1310 Dysphagia, unspecified: Secondary | ICD-10-CM | POA: Diagnosis not present

## 2024-03-22 DIAGNOSIS — I10 Essential (primary) hypertension: Secondary | ICD-10-CM | POA: Diagnosis not present

## 2024-03-22 LAB — COMPREHENSIVE METABOLIC PANEL WITH GFR
ALT: 13 U/L (ref 0–44)
AST: 23 U/L (ref 15–41)
Albumin: 3.7 g/dL (ref 3.5–5.0)
Alkaline Phosphatase: 94 U/L (ref 38–126)
Anion gap: 13 (ref 5–15)
BUN: 14 mg/dL (ref 8–23)
CO2: 25 mmol/L (ref 22–32)
Calcium: 8.4 mg/dL — ABNORMAL LOW (ref 8.9–10.3)
Chloride: 103 mmol/L (ref 98–111)
Creatinine, Ser: 0.47 mg/dL (ref 0.44–1.00)
GFR, Estimated: >60 mL/min
Glucose, Bld: 59 mg/dL — ABNORMAL LOW (ref 70–99)
Potassium: 3.8 mmol/L (ref 3.5–5.1)
Sodium: 140 mmol/L (ref 135–145)
Total Bilirubin: <0.2 mg/dL (ref 0.0–1.2)
Total Protein: 6.2 g/dL — ABNORMAL LOW (ref 6.5–8.1)

## 2024-03-22 LAB — CBC WITH DIFFERENTIAL/PLATELET
Abs Immature Granulocytes: 0.03 K/uL (ref 0.00–0.07)
Basophils Absolute: 0 K/uL (ref 0.0–0.1)
Basophils Relative: 0 %
Eosinophils Absolute: 0 K/uL (ref 0.0–0.5)
Eosinophils Relative: 0 %
HCT: 39.2 % (ref 36.0–46.0)
Hemoglobin: 13 g/dL (ref 12.0–15.0)
Immature Granulocytes: 0 %
Lymphocytes Relative: 10 %
Lymphs Abs: 0.7 K/uL (ref 0.7–4.0)
MCH: 32.5 pg (ref 26.0–34.0)
MCHC: 33.2 g/dL (ref 30.0–36.0)
MCV: 98 fL (ref 80.0–100.0)
Monocytes Absolute: 0.6 K/uL (ref 0.1–1.0)
Monocytes Relative: 9 %
Neutro Abs: 5.6 K/uL (ref 1.7–7.7)
Neutrophils Relative %: 81 %
Platelets: 237 K/uL (ref 150–400)
RBC: 4 MIL/uL (ref 3.87–5.11)
RDW: 12.3 % (ref 11.5–15.5)
WBC: 7 K/uL (ref 4.0–10.5)
nRBC: 0 % (ref 0.0–0.2)

## 2024-03-22 LAB — POC OCCULT BLOOD, ED: Fecal Occult Bld: NEGATIVE

## 2024-03-22 LAB — CBG MONITORING, ED
Glucose-Capillary: 79 mg/dL (ref 70–99)
Glucose-Capillary: 67 mg/dL — ABNORMAL LOW (ref 70–99)

## 2024-03-22 LAB — LIPASE, BLOOD: Lipase: 35 U/L (ref 11–51)

## 2024-03-22 MED ORDER — SODIUM CHLORIDE 0.9 % IV BOLUS
500.0000 mL | Freq: Once | INTRAVENOUS | Status: AC
  Administered 2024-03-22: 500 mL via INTRAVENOUS

## 2024-03-22 NOTE — ED Provider Notes (Signed)
 " [place] EMERGENCY DEPARTMENT AT [facility] Provider Note   CSN: [id number] Arrival date & time: 03/22/24  1639     Patient presents with: Weakness   Faith [name] is a 69 y.o. female.    Weakness Associated symptoms: diarrhea        Faith [name] is a 69 y.o. female with past medical history of iron deficient anemia, multiple sclerosis, COPD, hypertension decubitus ulcer who presents to the Emergency Department brought in by EMS for evaluation of generalized weakness and diarrhea.  Per EMS report, she has been having diarrhea x 2 weeks.  Patient states her stools have been black today.  She cannot recall if she takes iron.  She denies any chest pain or abdominal pain.  No fever or chills or vomiting.  She endorses decreased p.o. intake for several days.  She is followed by local GI, was scheduled to have EGD tomorrow, she states that she has not followed prepping instructions.  She denies any blood thinners   Received verbal consent from patient to speak with her daughter Tawni who is also her caregiver.  Her daughter confirms persistent diarrhea mainly since December.  She has been taking Kaopectate and Pepto-Bismol for her diarrhea with minimal relief.  She endorses that her mother has had decreased appetite without vomiting or fever.  She does have a history of iron deficient anemia but does not currently take iron supplement  Prior to Admission medications  Medication Sig Start Date End Date Taking? Authorizing Provider  ascorbic acid  (VITAMIN C) 500 MG tablet Take 1 tablet (500 mg total) by mouth daily. 02/18/21   Vann, Jessica U, DO  Cholecalciferol  (VITAMIN D3) 50 MCG (2000 UT) capsule Take 2,000 Units by mouth daily. 02/11/21   [provider]  diazepam  (VALIUM ) 5 MG tablet Take 1 tablet (5 mg total) by mouth See admin instructions. TAKE (1) TABLET BY MOUTH EVERY SIX HOURS AS NEEDED FOR MUSCLE SPASMS. 02/17/21   Vann, Jessica U, DO  feeding supplement  (ENSURE ENLIVE / ENSURE PLUS) LIQD Take 237 mLs by mouth 3 (three) times daily between meals. 12/12/20   Ezenduka, Nkeiruka J, MD  gabapentin  (NEURONTIN ) 300 MG capsule Take 2 capsules (600 mg total) by mouth 3 (three) times daily. 02/05/19   Angiulli, Toribio PARAS, PA-C  HYDROcodone -acetaminophen  (NORCO/VICODIN) 5-325 MG tablet Take 1 tablet by mouth every 6 (six) hours as needed for moderate pain. 02/17/21   Vann, Jessica U, DO  modafinil  (PROVIGIL ) 200 MG tablet Take 1 tablet (200 mg total) by mouth in the morning. 02/15/23   Sater, Charlie LABOR, MD  pantoprazole  (PROTONIX ) 20 MG tablet Take 20 mg by mouth daily.    [provider]  propranolol  ER (INDERAL  LA) 60 MG 24 hr capsule Take 60 mg by mouth daily.    [provider]    Allergies: Darvon [propoxyphene] and Penicillins    Review of Systems  Gastrointestinal:  Positive for diarrhea.  Neurological:  Positive for weakness.  All other systems reviewed and are negative.   Updated Vital Signs BP (!) 152/88   Pulse 75   Temp 97.6 F (36.4 C) (Oral)   Resp 18   Ht 5' 4 (1.626 m)   Wt 46.3 kg   SpO2 100%   BMI 17.51 kg/m   Physical Exam Vitals and nursing note reviewed. Exam conducted with a chaperone present.  Constitutional:      Comments: Frail cachectic appearing  HENT:     Mouth/Throat:  Mouth: Mucous membranes are moist.  Eyes:     Conjunctiva/sclera: Conjunctivae normal.     Pupils: Pupils are equal, round, and reactive to light.  Cardiovascular:     Rate and Rhythm: Normal rate and regular rhythm.     Pulses: Normal pulses.  Pulmonary:     Effort: Pulmonary effort is normal.     Breath sounds: Normal breath sounds.  Abdominal:     General: There is no distension.     Palpations: Abdomen is soft.     Tenderness: There is no abdominal tenderness.  Genitourinary:    Rectum: Guaiac result negative. External hemorrhoid present. No tenderness.     Comments: Sacral decubitus without evidence of  infection no drainage.  Multiple external hemorrhoids nonthrombosed.  No rectal bleeding.  Black heme positive stool on DRE Musculoskeletal:     Right lower leg: No edema.     Left lower leg: No edema.  Skin:    General: Skin is warm.     Capillary Refill: Capillary refill takes less than 2 seconds.  Neurological:     General: No focal deficit present.     Mental Status: She is alert.     (all labs ordered are listed, but only abnormal results are displayed) Labs Reviewed  COMPREHENSIVE METABOLIC PANEL WITH GFR - Abnormal; Notable for the following components:      Result Value   Glucose, Bld 59 (*)    Calcium  8.4 (*)    Total Protein 6.2 (*)    All other components within normal limits  CBG MONITORING, ED - Abnormal; Notable for the following components:   Glucose-Capillary 67 (*)    All other components within normal limits  CBC WITH DIFFERENTIAL/PLATELET  LIPASE, BLOOD  CBG MONITORING, ED  POC OCCULT BLOOD, ED    EKG: None  Radiology: No results found.   Procedures   Medications Ordered in the ED - No data to display                                  Medical Decision Making  Patient here from home for evaluation of diarrhea.  She has had dysphagia for some time, scheduled for EGD tomorrow.  Here concerns for dehydration.  Noticed black stool recently.  Patient poor historian, she is unsure if she has been taking iron.  Vital signs reassuring.  She is afebrile.  Abdomen is soft and nontender on exam, no indication for need of imaging at this time.  Diarrhea possible related to infectious process, GI bleed  I spoke with patient's daughter, Tawni who gave additional history information.  Patient has been taking Pepto-Bismol which is likely source of her dark stools.  Amount and/or Complexity of Data Reviewed Labs: ordered. Discussion of management or test interpretation with external provider(s):  Patient had 1 black stool upon arrival, heme-negative.   No further stools since.  Has tolerated oral fluids.  Reports feeling better.  Benign abdominal exam.  I have made Dr. Cinderella aware that patient is here with diarrhea.  She has been given IV fluids and feeling better.  C. difficile was ordered but patient unable to provide sample.  I feel that she is appropriate for discharge home and will continue with EGD procedure tomorrow as previously scheduled.  Reassuring workup, vital signs are also reassuring.  Patient's daughter made aware of findings.  She appears appropriate for discharge home, strict return precautions were given.  Final diagnoses:  Diarrhea, unspecified type    ED Discharge Orders     None          Herlinda Milling, PA-C 03/24/24 1120  "

## 2024-03-22 NOTE — ED Notes (Signed)
 Pt given water to drink.

## 2024-03-22 NOTE — ED Notes (Signed)
 Pt given cup of Applesauce at her request.

## 2024-03-22 NOTE — ED Triage Notes (Signed)
 Pt bib [facility] with complaints of weakness that has been going on a week. Pt states that she has had diarrhea for about 2 weeks. Pt does have an endoscopy scheduled for tomorrow, but has not done any prepping for it.

## 2024-03-22 NOTE — Telephone Encounter (Signed)
 Patients daughter wanted to call you and inform you that the patient has had content diarrhea for the past two weeks.

## 2024-03-22 NOTE — Discharge Instructions (Signed)
 Continue with your plan for your procedure tomorrow as scheduled.  Nothing to eat or drink after midnight.  Follow-up with your primary care provider as well.

## 2024-03-23 ENCOUNTER — Emergency Department (HOSPITAL_COMMUNITY): Payer: Medicare (Managed Care)

## 2024-03-23 ENCOUNTER — Emergency Department (HOSPITAL_COMMUNITY)
Admission: EM | Admit: 2024-03-23 | Discharge: 2024-03-23 | Disposition: A | Discharge status: Home / Self Care | Payer: Medicare (Managed Care) | Attending: Emergency Medicine | Admitting: Emergency Medicine

## 2024-03-23 ENCOUNTER — Other Ambulatory Visit: Payer: Self-pay

## 2024-03-23 ENCOUNTER — Ambulatory Visit (HOSPITAL_COMMUNITY)
Admission: RE | Admit: 2024-03-23 | Payer: Medicare (Managed Care) | Source: Home / Self Care | Admitting: Gastroenterology

## 2024-03-23 ENCOUNTER — Encounter (HOSPITAL_COMMUNITY): Payer: Self-pay

## 2024-03-23 ENCOUNTER — Encounter (HOSPITAL_COMMUNITY)
Admission: EM | Disposition: A | Discharge status: Home / Self Care | Payer: Self-pay | Source: Home / Self Care | Attending: Emergency Medicine

## 2024-03-23 DIAGNOSIS — R197 Diarrhea, unspecified: Secondary | ICD-10-CM | POA: Insufficient documentation

## 2024-03-23 DIAGNOSIS — K2289 Other specified disease of esophagus: Secondary | ICD-10-CM | POA: Diagnosis not present

## 2024-03-23 DIAGNOSIS — Z8261 Family history of arthritis: Secondary | ICD-10-CM | POA: Insufficient documentation

## 2024-03-23 DIAGNOSIS — K209 Esophagitis, unspecified without bleeding: Secondary | ICD-10-CM | POA: Insufficient documentation

## 2024-03-23 DIAGNOSIS — F17210 Nicotine dependence, cigarettes, uncomplicated: Secondary | ICD-10-CM | POA: Insufficient documentation

## 2024-03-23 DIAGNOSIS — K449 Diaphragmatic hernia without obstruction or gangrene: Secondary | ICD-10-CM | POA: Insufficient documentation

## 2024-03-23 DIAGNOSIS — G35D Multiple sclerosis, unspecified: Secondary | ICD-10-CM | POA: Insufficient documentation

## 2024-03-23 DIAGNOSIS — I10 Essential (primary) hypertension: Secondary | ICD-10-CM | POA: Insufficient documentation

## 2024-03-23 DIAGNOSIS — K31819 Angiodysplasia of stomach and duodenum without bleeding: Secondary | ICD-10-CM | POA: Insufficient documentation

## 2024-03-23 DIAGNOSIS — J449 Chronic obstructive pulmonary disease, unspecified: Secondary | ICD-10-CM | POA: Insufficient documentation

## 2024-03-23 DIAGNOSIS — Z8249 Family history of ischemic heart disease and other diseases of the circulatory system: Secondary | ICD-10-CM | POA: Insufficient documentation

## 2024-03-23 DIAGNOSIS — I739 Peripheral vascular disease, unspecified: Secondary | ICD-10-CM | POA: Insufficient documentation

## 2024-03-23 DIAGNOSIS — M1990 Unspecified osteoarthritis, unspecified site: Secondary | ICD-10-CM | POA: Insufficient documentation

## 2024-03-23 DIAGNOSIS — K2970 Gastritis, unspecified, without bleeding: Secondary | ICD-10-CM | POA: Insufficient documentation

## 2024-03-23 DIAGNOSIS — R1310 Dysphagia, unspecified: Secondary | ICD-10-CM | POA: Insufficient documentation

## 2024-03-23 LAB — CBG MONITORING, ED: Glucose-Capillary: 70 mg/dL (ref 70–99)

## 2024-03-23 MED ORDER — LIDOCAINE 2% (20 MG/ML) 5 ML SYRINGE
INTRAMUSCULAR | Status: DC | PRN
  Administered 2024-03-23: 60 mL via INTRAVENOUS

## 2024-03-23 MED ORDER — PHENYLEPHRINE 80 MCG/ML (10ML) SYRINGE FOR IV PUSH (FOR BLOOD PRESSURE SUPPORT)
PREFILLED_SYRINGE | INTRAVENOUS | Status: DC | PRN
  Administered 2024-03-23: 160 ug via INTRAVENOUS

## 2024-03-23 MED ORDER — PROPOFOL 10 MG/ML IV BOLUS
INTRAVENOUS | Status: DC | PRN
  Administered 2024-03-23: 50 mg via INTRAVENOUS

## 2024-03-23 MED ORDER — PROPOFOL 500 MG/50ML IV EMUL
INTRAVENOUS | Status: DC | PRN
  Administered 2024-03-23: 150 ug/kg/min via INTRAVENOUS

## 2024-03-23 MED ORDER — LACTATED RINGERS IV SOLN: INTRAVENOUS | Status: DC | PRN

## 2024-03-23 NOTE — Discharge Instructions (Addendum)
" °  Discharge instructions Please read the instructions outlined below and refer to this sheet in the next few weeks. These discharge instructions provide you with general information on caring for yourself after you leave the hospital. Your doctor may also give you specific instructions. While your treatment has been planned according to the most current medical practices available, unavoidable complications occasionally occur. If you have any problems or questions after discharge, please call your doctor. ACTIVITY You may resume your regular activity but move at a slower pace for the next 24 hours.  Take frequent rest periods for the next 24 hours.  Walking will help expel (get rid of) the air and reduce the bloated feeling in your abdomen.  No driving for 24 hours (because of the anesthesia (medicine) used during the test).  You may shower.  Do not sign any important legal documents or operate any machinery for 24 hours (because of the anesthesia used during the test).  NUTRITION Drink plenty of fluids.  You may resume your normal diet.  Begin with a light meal and progress to your normal diet.  Avoid alcoholic beverages for 24 hours or as instructed by your caregiver.  MEDICATIONS You may resume your normal medications unless your caregiver tells you otherwise.  WHAT YOU CAN EXPECT TODAY You may experience abdominal discomfort such as a feeling of fullness or gas pains.  FOLLOW-UP Your doctor will discuss the results of your test with you.  SEEK IMMEDIATE MEDICAL ATTENTION IF ANY OF THE FOLLOWING OCCUR: Excessive nausea (feeling sick to your stomach) and/or vomiting.  Severe abdominal pain and distention (swelling).  Trouble swallowing.  Temperature over 101 F (37.8 C).  Rectal bleeding or vomiting of blood.    Please submit stool sample at [facility] if you continue to have Diarrhea    I hope you have a great rest of your week!   Chanley Mcenery Faizan Robbi Scurlock , M.D.. Gastroenterology and  Hepatology [facility]    "

## 2024-03-23 NOTE — ED Provider Notes (Signed)
 " [place] EMERGENCY DEPARTMENT AT [facility] Provider Note   CSN: [id number] Arrival date & time: 03/23/24  0123     Patient presents with: Diarrhea   Faith [name] is a 69 y.o. female.   The history is provided by the patient.  Patient with history of COPD, multiple sclerosis presents for recurrent diarrhea Patient reports diarrhea and generalized weakness intermittently for the past 2 weeks.  No fevers or vomiting.  No abdominal pain She was just seen in the ER yesterday evening with extensive evaluation that was overall unremarkable. Patient felt improved and was discharged home.  Upon arriving at home, she had another large episode of diarrhea and she called 911 again  Patient is scheduled for endoscopy later today for worsening dysphagia    Prior to Admission medications  Medication Sig Start Date End Date Taking? Authorizing Provider  ascorbic acid  (VITAMIN C) 500 MG tablet Take 1 tablet (500 mg total) by mouth daily. 02/18/21   Vann, Jessica U, DO  Cholecalciferol  (VITAMIN D3) 50 MCG (2000 UT) capsule Take 2,000 Units by mouth daily. 02/11/21   [provider]  diazepam  (VALIUM ) 5 MG tablet Take 1 tablet (5 mg total) by mouth See admin instructions. TAKE (1) TABLET BY MOUTH EVERY SIX HOURS AS NEEDED FOR MUSCLE SPASMS. 02/17/21   Vann, Jessica U, DO  feeding supplement (ENSURE ENLIVE / ENSURE PLUS) LIQD Take 237 mLs by mouth 3 (three) times daily between meals. 12/12/20   Ezenduka, Nkeiruka J, MD  gabapentin  (NEURONTIN ) 300 MG capsule Take 2 capsules (600 mg total) by mouth 3 (three) times daily. 02/05/19   Angiulli, Toribio PARAS, PA-C  modafinil  (PROVIGIL ) 200 MG tablet Take 1 tablet (200 mg total) by mouth in the morning. 02/15/23   Sater, Charlie DELENA, MD  pantoprazole  (PROTONIX ) 20 MG tablet Take 20 mg by mouth daily.    [provider]  propranolol  ER (INDERAL  LA) 60 MG 24 hr capsule Take 60 mg by mouth daily.    [provider]     Allergies: Darvon [propoxyphene] and Penicillins    Review of Systems  Constitutional:  Negative for fever.  Gastrointestinal:  Positive for diarrhea. Negative for anal bleeding and vomiting.    Updated Vital Signs BP 134/80   Pulse 72   Temp 97.8 F (36.6 C) (Oral)   Resp 19   SpO2 98%   Physical Exam CONSTITUTIONAL: Chronically ill-appearing, no acute distress HEAD: Normocephalic/atraumatic LUNGS:  no apparent distress ABDOMEN: soft, nontender Patient wearing a diaper NEURO: Pt is awake/alert/appropriate  (all labs ordered are listed, but only abnormal results are displayed) Labs Reviewed  GASTROINTESTINAL PANEL BY PCR, STOOL (REPLACES STOOL CULTURE)  C DIFFICILE QUICK SCREEN W PCR REFLEX    CBG MONITORING, ED    EKG: None  Radiology: No results found.   Procedures   Medications Ordered in the ED - No data to display  Clinical Course as of 03/23/24 0705  Fri Mar 23, 2024  0301 Patient chronically ill presents for recurrent diarrhea.  Patient had thorough evaluation yesterday evening including negative labs and negative Hemoccult except for mild hyperglycemia.  Glucose checked here was normal  Will attempt to send stool studies.  Will defer further workup at this time. Anticipate monitoring patient in the ER until her endoscopy in the morning [DW]  0625 Discussed the case with Dr. Cinderella with GI He is aware of the patient is in the ER. If we are able to obtain a stool sample,  please send it.  Otherwise she will be evaluated later this morning for EGD [DW]  0704 Signed out to Dr Suzette at [facility], pt will go to EGD [DW]    Clinical Course User Index [DW] Midge Golas, MD                                 Medical Decision Making       Final diagnoses:  Diarrhea, unspecified type    ED Discharge Orders     None          Midge Golas, MD 03/23/24 [id number]  "

## 2024-03-23 NOTE — Progress Notes (Signed)
 Daughter called per patient's request, no answer. Left a message on her identified voicemail.

## 2024-03-23 NOTE — ED Notes (Signed)
 PT placed on bedpan, unmeasured urine.

## 2024-03-23 NOTE — H&P (Signed)
 Primary Care Physician:  Cloria Annabella CROME, DO Primary Gastroenterologist:  Dr. Cinderella  Pre-Procedure History & Physical: HPI:   Faith [name] is a 69 y.o. female with multiple sclerosis, COPD, chronic smoker who presents for evaluation of solid  Patient reports progressive solid food dysphagia for the past 6 months.  She reports as if food is getting stuck in upper chest where she has to drink fluid after every bite of solid food.   Patient reports now diarrhea for past 2 weeks, has been in the ED twice since yesterday.  Although reports only 1 bowel movement in past 24 hours and no stool sample has been collected because patient did not had any BM in the ED since she has been there for 10+ hours   Repeats 3-4 BM on Sunday    Although patient has multiple sclerosis and dysphagia is a frequent complication that occurs at all stages of disease.  This could be oropharyngeal dysphagia as well and can be complicated with aspiration pneumonia and malnutrition.  Though upper esophageal sphincter dysfunction has also been reported in patient with multiple sclerosis  Last EGD: Reports upper endoscopy 3 to 5 years ago at [facility]; records unavailable Last Colonoscopy:none   Social: Chronic cigarette smoker   Last labs in July 2025 normal liver enzymes hemoglobin 14.1 platelet 201    Past Medical History:  Diagnosis Date   Anemia    my whole life, Used to take iron   Arthritis    spine, lumbar   COPD (chronic obstructive pulmonary disease) (HCC)    Emphysema of lung (HCC)    GERD (gastroesophageal reflux disease) 04/12/2021   HTN (hypertension) 03/01/2020   Hyperlipidemia 11/28/2019   Multiple sclerosis    Narcolepsy    by history, has nightmares   Neuromuscular disorder (HCC)    leg issues from spine    Past Surgical History:  Procedure Laterality Date   ANTERIOR CERVICAL DECOMP/DISCECTOMY FUSION N/A 01/19/2019   Procedure: ANTERIOR CERVICAL DECOMPRESSION/DISCECTOMY Cervical three  - four;  Surgeon: Gillie Duncans, MD;  Location: [facility] OR;  Service: Neurosurgery;  Laterality: N/A;   EYE SURGERY Right    ORIF FACIAL FRACTURE     POSTERIOR CERVICAL LAMINECTOMY N/A 01/19/2019   Procedure: POSTERIOR CERVICAL LAMINECTOMY Removal of Synovial Cyst and posterior cervical fusion;  Surgeon: Gillie Duncans, MD;  Location: [facility] OR;  Service: Neurosurgery;  Laterality: N/A;   TEE WITHOUT CARDIOVERSION N/A 03/17/2020   Procedure: TRANSESOPHAGEAL ECHOCARDIOGRAM (TEE) WITH PROPOFOL ;  Surgeon: Debera Jayson MATSU, MD;  Location: [facility] ENDO SUITE;  Service: Cardiovascular;  Laterality: N/A;   TUBAL LIGATION     tubaligation      Prior to Admission medications  Medication Sig Start Date End Date Taking? Authorizing Provider  ascorbic acid  (VITAMIN C) 500 MG tablet Take 1 tablet (500 mg total) by mouth daily. 02/18/21  Yes Vann, Jessica U, DO  Cholecalciferol  (VITAMIN D3) 50 MCG (2000 UT) capsule Take 2,000 Units by mouth daily. 02/11/21  Yes [provider]  diazepam  (VALIUM ) 5 MG tablet Take 1 tablet (5 mg total) by mouth See admin instructions. TAKE (1) TABLET BY MOUTH EVERY SIX HOURS AS NEEDED FOR MUSCLE SPASMS. 02/17/21  Yes Vann, Jessica U, DO  gabapentin  (NEURONTIN ) 300 MG capsule Take 2 capsules (600 mg total) by mouth 3 (three) times daily. 02/05/19  Yes Angiulli, Toribio PARAS, PA-C  modafinil  (PROVIGIL ) 200 MG tablet Take 1 tablet (200 mg total) by mouth in the morning. 02/15/23  Yes Sater, Charlie DELENA,  MD  pantoprazole  (PROTONIX ) 20 MG tablet Take 20 mg by mouth daily.   Yes [provider]  propranolol  ER (INDERAL  LA) 60 MG 24 hr capsule Take 60 mg by mouth daily.   Yes [provider]  feeding supplement (ENSURE ENLIVE / ENSURE PLUS) LIQD Take 237 mLs by mouth 3 (three) times daily between meals. 12/12/20   Donnamarie Lebron PARAS, MD    Allergies as of 03/23/2024 - Review Complete 03/23/2024  Allergen Reaction Noted   Darvon [propoxyphene] Other (See Comments)  04/30/2016   Other  03/23/2024   Penicillins Rash 10/14/2015    Family History  Problem Relation Age of Onset   Heart attack Mother    Kidney failure Mother    Diabetes Mother    Heart disease Mother    Hyperlipidemia Mother    Hypertension Mother    Heart attack Father 50   Hypertension Father    Hypertension Sister    Diabetes Sister    Other Brother        house fire   Hypertension Brother    Seizures Brother    Heart disease Brother        mitral valve   Arthritis Daughter        DJD, AS fibromyalgia   Polymyositis Daughter    Cancer Maternal Grandfather        lung   Heart disease Maternal Grandfather    Other Paternal Grandmother        house fire   Alcohol abuse Son     Social History   Socioeconomic History   Marital status: Single    Spouse name: Not on file   Number of children: 3   Years of education: 12   Highest education level: Not on file  Occupational History   Occupation: disabled    Comment: house painting  Tobacco Use   Smoking status: Every Day    Current packs/day: 0.25    Average packs/day: 0.3 packs/day for 45.0 years (11.3 ttl pk-yrs)    Types: Cigarettes   Smokeless tobacco: Never   Tobacco comments:    cutting down  Vaping Use   Vaping status: Never Used  Substance and Sexual Activity   Alcohol use: Not Currently    Alcohol/week: 1.0 standard drink of alcohol    Types: 1 Cans of beer per week   Drug use: No   Sexual activity: Not Currently    Birth control/protection: Post-menopausal  Other Topics Concern   Not on file  Social History Narrative   Patient lives with her son. Darden Sieving, age 7, frequently visits   Social Drivers of Health   Tobacco Use: High Risk (03/23/2024)   Patient History    Smoking Tobacco Use: Every Day    Smokeless Tobacco Use: Never    Passive Exposure: Not on file  Financial Resource Strain: Not on file  Food Insecurity: Not on file  Transportation Needs: Not on file  Physical Activity:  Not on file  Stress: Not on file  Social Connections: Not on file  Intimate Partner Violence: Not on file  Depression (PHQ2-9): Low Risk (10/15/2022)   Depression (PHQ2-9)    PHQ-2 Score: 0  Alcohol Screen: Not on file  Housing: Not on file  Utilities: Not on file  Health Literacy: Not on file    Review of Systems: See HPI, otherwise negative ROS  Physical Exam: Vital signs in last 24 hours: Temp:  [97.6 F (36.4 C)-98.1 F (36.7 C)] 97.9 F (  36.6 C) (01/23 1013) Pulse Rate:  [70-87] 79 (01/23 1013) Resp:  [12-22] 17 (01/23 1013) BP: (115-166)/(67-96) 117/93 (01/23 1013) SpO2:  [91 %-100 %] 100 % (01/23 1013) Weight:  [46.3 kg] 46.3 kg (01/23 1013)   General:   Alert,  Well-developed, well-nourished, pleasant and cooperative in NAD Head:  Normocephalic and atraumatic. Eyes:  Sclera clear, no icterus.   Conjunctiva pink. Ears:  Normal auditory acuity. Nose:  No deformity, discharge,  or lesions. Msk:  Symmetrical without gross deformities. Normal posture. Extremities:  Without clubbing or edema. Neurologic:  Alert and  oriented x4;  grossly normal neurologically. Skin:  Intact without significant lesions or rashes. Psych:  Alert and cooperative. Normal mood and affect.  Impression/Plan:  Faith [name] is a 69 y.o. female with multiple sclerosis, COPD, chronic smoker who presents for evaluation of solid food dysphagia   upper endoscopy with dilation Speech and swallow evaluation and possible modified barium swallow  The risks of the procedure including infection, bleed, or perforation as well as benefits, limitations, alternatives and imponderables have been reviewed with the patient. Questions have been answered. All parties agreeable.

## 2024-03-23 NOTE — Anesthesia Preprocedure Evaluation (Signed)
"                                    Anesthesia Evaluation  Patient identified by MRN, date of birth, ID band Patient awake    Reviewed: Allergy & Precautions, H&P , NPO status , Patient's Chart, lab work & pertinent test results  Airway Mallampati: I  TM Distance: >3 FB Neck ROM: Full    Dental no notable dental hx.    Pulmonary COPD, Current Smoker and Patient abstained from smoking.   Pulmonary exam normal breath sounds clear to auscultation       Cardiovascular hypertension, + Peripheral Vascular Disease  Normal cardiovascular exam Rhythm:Regular Rate:Normal     Neuro/Psych  Neuromuscular disease  negative psych ROS   GI/Hepatic Neg liver ROS,GERD  ,,  Endo/Other  negative endocrine ROS    Renal/GU negative Renal ROS  negative genitourinary   Musculoskeletal  (+) Arthritis ,    Abdominal   Peds negative pediatric ROS (+)  Hematology  (+) Blood dyscrasia, anemia   Anesthesia Other Findings   Reproductive/Obstetrics negative OB ROS                              Anesthesia Physical Anesthesia Plan  ASA: 3  Anesthesia Plan: General   Post-op Pain Management:    Induction: Intravenous  PONV Risk Score and Plan:   Airway Management Planned: Nasal Cannula and Natural Airway  Additional Equipment:   Intra-op Plan:   Post-operative Plan:   Informed Consent: I have reviewed the patients History and Physical, chart, labs and discussed the procedure including the risks, benefits and alternatives for the proposed anesthesia with the patient or authorized representative who has indicated his/her understanding and acceptance.     Dental advisory given  Plan Discussed with: CRNA  Anesthesia Plan Comments:         Anesthesia Quick Evaluation  "

## 2024-03-23 NOTE — ED Triage Notes (Signed)
 PT BIB [facility] from home. Pt was d/c from this facility about 30 mins prior. Pt stated when she got home It exploded everywhere. States she would like us  to get a stool sample.

## 2024-03-23 NOTE — Transfer of Care (Signed)
 Immediate Anesthesia Transfer of Care Note  Patient: Faith [name]  Procedure(s) Performed: EGD (ESOPHAGOGASTRODUODENOSCOPY) DILATION, ESOPHAGUS  Patient Location: Short Stay  Anesthesia Type:General  Level of Consciousness: awake, alert , oriented, and patient cooperative  Airway & Oxygen Therapy: Patient Spontanous Breathing  Post-op Assessment: Report given to RN, Post -op Vital signs reviewed and stable, and Patient moving all extremities X 4  Post vital signs: Reviewed and stable  Last Vitals:  Vitals Value Taken Time  BP 111/87 03/23/24 11:44  Temp 36.5 C 03/23/24 11:44  Pulse 75 03/23/24 11:44  Resp 18 03/23/24 11:44  SpO2 98 % 03/23/24 11:44    Last Pain:  Vitals:   03/23/24 1144  TempSrc: Oral  PainSc: 0-No pain      Patients Stated Pain Goal: 7 (03/23/24 1144)  Complications: No notable events documented.

## 2024-03-23 NOTE — Anesthesia Postprocedure Evaluation (Signed)
"   Anesthesia Post Note  Patient: Faith [name]  Procedure(s) Performed: EGD (ESOPHAGOGASTRODUODENOSCOPY) DILATION, ESOPHAGUS  Patient location during evaluation: PACU Anesthesia Type: General Level of consciousness: awake and alert Pain management: pain level controlled Vital Signs Assessment: post-procedure vital signs reviewed and stable Respiratory status: spontaneous breathing, nonlabored ventilation, respiratory function stable and patient connected to nasal cannula oxygen Cardiovascular status: blood pressure returned to baseline and stable Postop Assessment: no apparent nausea or vomiting Anesthetic complications: no   No notable events documented.   Last Vitals:  Vitals:   03/23/24 1140 03/23/24 1144  BP: (!) 80/60 111/87  Pulse:  75  Resp:  18  Temp:  36.5 C  SpO2:  98%    Last Pain:  Vitals:   03/23/24 1144  TempSrc: Oral  PainSc: 0-No pain                 Andrea Limes      "

## 2024-03-26 ENCOUNTER — Encounter (HOSPITAL_COMMUNITY): Payer: Self-pay | Admitting: Gastroenterology

## 2024-03-26 LAB — SURGICAL PATHOLOGY

## 2024-03-26 NOTE — Op Note (Signed)
 [facility] Patient Name: Faith [name] Procedure Date: 03/23/2024 11:12 AM MRN: [id number] Date of Birth: 03/14/1955 Attending MD: Deatrice Dine , MD, [id number] CSN: [id number] Age: 69 Admit Type: Outpatient Procedure:                Upper GI endoscopy Indications:              Dysphagia Providers:                Deatrice Dine, MD, Jon LABOR. Gerome RN, RN, Madelin Hunter, RN Referring MD:              Medicines:                Monitored Anesthesia Care Complications:            No immediate complications. Estimated Blood Loss:     Estimated blood loss was minimal. Procedure:                Pre-Anesthesia Assessment:                           - Prior to the procedure, a History and Physical                            was performed, and patient medications and                            allergies were reviewed. The patient's tolerance of                            previous anesthesia was also reviewed. The risks                            and benefits of the procedure and the sedation                            options and risks were discussed with the patient.                            All questions were answered, and informed consent                            was obtained. Prior Anticoagulants: The patient has                            taken no anticoagulant or antiplatelet agents. ASA                            Grade Assessment: III - A patient with severe                            systemic disease. After reviewing the risks and  benefits, the patient was deemed in satisfactory                            condition to undergo the procedure.                           After obtaining informed consent, the endoscope was                            passed under direct vision. Throughout the                            procedure, the patient's blood pressure, pulse, and                            oxygen saturations were monitored  continuously. The                            [id number] ([id number]) Upper was introduced through                            the mouth, and advanced to the second part of                            duodenum. The upper GI endoscopy was accomplished                            without difficulty. The patient tolerated the                            procedure well. Scope In: 11:28:07 AM Scope Out: 11:36:57 AM Total Procedure Duration: 0 hours 8 minutes 50 seconds  Findings:      No endoscopic abnormality was evident in the esophagus to explain the       patient's complaint of dysphagia. It was decided, however, to proceed       with dilation of the entire esophagus. A TTS dilator was passed through       the scope. Dilation with a 15-16.5-18 mm balloon dilator was performed       to 18 mm. The dilation site was examined following endoscope reinsertion       and showed no change. Biopsies were obtained from the proximal and       distal esophagus with cold forceps for histology of suspected       eosinophilic esophagitis.      A 2 cm hiatal hernia was present.      Esophagogastric landmarks were identified: the Z-line was found at 40 cm       and the site of the diaphragmatic hiatus was found at 42 cm from the       incisors.      A single small angioectasia with no bleeding was found in the gastric       body.      Mildly erythematous mucosa without bleeding was found in the gastric       antrum. Biopsies were taken with a cold forceps for histology.      The duodenal bulb and  second portion of the duodenum were normal.       Biopsies were taken with a cold forceps for histology. Impression:               - No endoscopic esophageal abnormality to explain                            patient's dysphagia. Esophagus dilated. Dilated.                           - 2 cm hiatal hernia.                           - Esophagogastric landmarks identified.                           - A single non-bleeding  angioectasia in the stomach.                           - Erythematous mucosa in the antrum. Biopsied.                           - Normal duodenal bulb and second portion of the                            duodenum. Biopsied.                           - Biopsies were taken with a cold forceps for                            evaluation of eosinophilic esophagitis. Moderate Sedation:      Per Anesthesia Care Recommendation:           - Patient has a contact number available for                            emergencies. The signs and symptoms of potential                            delayed complications were discussed with the                            patient. Return to normal activities tomorrow.                            Written discharge instructions were provided to the                            patient.                           - Resume previous diet.                           - Continue present medications.                           -  Await pathology results.                           -Obtain SLP eval and Modified barium swallow and if                            negative may consider esophageal HRM if persistent                            symptoms                           - multiple sclerosis and dysphagia is a frequent                            complication that occurs at all stages of disease.                            This could be oropharyngeal dysphagia as well and                            can be complicated with aspiration pneumonia and                            malnutrition. Though upper esophageal sphincter                            dysfunction has also been reported in patient with                            multiple sclerosis Procedure Code(s):        --- Professional ---                           [phone], Esophagogastroduodenoscopy, flexible,                            transoral; with transendoscopic balloon dilation of                            esophagus (less than  30 mm diameter)                           43239, 59, Esophagogastroduodenoscopy, flexible,                            transoral; with biopsy, single or multiple Diagnosis Code(s):        --- Professional ---                           R13.10, Dysphagia, unspecified                           K44.9, Diaphragmatic hernia without obstruction or  gangrene                           K31.819, Angiodysplasia of stomach and duodenum                            without bleeding                           K31.89, Other diseases of stomach and duodenum CPT copyright 2022 [employer]. All rights reserved. The codes documented in this report are preliminary and upon coder review may  be revised to meet current compliance requirements. Deatrice Dine, MD Deatrice Dine, MD 03/23/2024 11:55:20 AM This report has been signed electronically. Number of Addenda: 0

## 2024-03-27 ENCOUNTER — Ambulatory Visit (INDEPENDENT_AMBULATORY_CARE_PROVIDER_SITE_OTHER): Payer: Self-pay | Admitting: Gastroenterology

## 2024-03-27 ENCOUNTER — Telehealth (INDEPENDENT_AMBULATORY_CARE_PROVIDER_SITE_OTHER): Payer: Self-pay | Admitting: *Deleted

## 2024-03-27 ENCOUNTER — Other Ambulatory Visit (INDEPENDENT_AMBULATORY_CARE_PROVIDER_SITE_OTHER): Payer: Self-pay

## 2024-03-27 DIAGNOSIS — R1319 Other dysphagia: Secondary | ICD-10-CM

## 2024-03-27 DIAGNOSIS — G35D Multiple sclerosis, unspecified: Secondary | ICD-10-CM

## 2024-03-27 DIAGNOSIS — T17308A Unspecified foreign body in larynx causing other injury, initial encounter: Secondary | ICD-10-CM

## 2024-03-27 NOTE — Telephone Encounter (Signed)
 Referral sent, they will contact patient with apt

## 2024-03-27 NOTE — Telephone Encounter (Signed)
-----   Message from Deatrice Dine, MD sent at 03/23/2024 12:15 PM EST ----- Regarding: SLP Hi Jenkins ,  Can you please arrange a referral for this patient to Speech and swallow evaluation and Modified barium swallow  Diagnosis: Dysphagia, Multiple Sclerosis    Thanks,  Muhammad Faizan Ahmed, MD Gastroenterology and Hepatology [facility]

## 2024-03-28 ENCOUNTER — Other Ambulatory Visit (HOSPITAL_COMMUNITY): Payer: Self-pay | Admitting: Occupational Therapy

## 2024-03-28 DIAGNOSIS — R1312 Dysphagia, oropharyngeal phase: Secondary | ICD-10-CM

## 2024-03-28 DIAGNOSIS — T17308D Unspecified foreign body in larynx causing other injury, subsequent encounter: Secondary | ICD-10-CM

## 2024-04-04 NOTE — Progress Notes (Signed)
 Patient result letter mailed procedure note and pathology result faxed to PCP

## 2024-04-30 ENCOUNTER — Other Ambulatory Visit (HOSPITAL_COMMUNITY): Payer: Medicare (Managed Care)

## 2024-04-30 ENCOUNTER — Ambulatory Visit (HOSPITAL_COMMUNITY): Payer: Medicare (Managed Care) | Admitting: Speech Pathology

## 2024-05-23 ENCOUNTER — Ambulatory Visit: Payer: Medicare (Managed Care) | Admitting: Neurology
